# Patient Record
Sex: Male | Born: 1945 | Race: White | Hispanic: No | Marital: Married | Smoking: Former smoker
Health system: Southern US, Community
[De-identification: ages and names within clinical notes are randomized; demographics above are authoritative.]

## PROBLEM LIST (undated history)

## (undated) DIAGNOSIS — K7460 Unspecified cirrhosis of liver: Secondary | ICD-10-CM

## (undated) DIAGNOSIS — M1990 Unspecified osteoarthritis, unspecified site: Secondary | ICD-10-CM

## (undated) DIAGNOSIS — G4730 Sleep apnea, unspecified: Secondary | ICD-10-CM

## (undated) DIAGNOSIS — I10 Essential (primary) hypertension: Secondary | ICD-10-CM

## (undated) DIAGNOSIS — W19XXXA Unspecified fall, initial encounter: Secondary | ICD-10-CM

## (undated) DIAGNOSIS — R011 Cardiac murmur, unspecified: Secondary | ICD-10-CM

## (undated) DIAGNOSIS — K7581 Nonalcoholic steatohepatitis (NASH): Secondary | ICD-10-CM

## (undated) DIAGNOSIS — K284 Chronic or unspecified gastrojejunal ulcer with hemorrhage: Secondary | ICD-10-CM

## (undated) DIAGNOSIS — I8500 Esophageal varices without bleeding: Secondary | ICD-10-CM

## (undated) DIAGNOSIS — D509 Iron deficiency anemia, unspecified: Secondary | ICD-10-CM

## (undated) DIAGNOSIS — K274 Chronic or unspecified peptic ulcer, site unspecified, with hemorrhage: Secondary | ICD-10-CM

## (undated) DIAGNOSIS — C801 Malignant (primary) neoplasm, unspecified: Secondary | ICD-10-CM

## (undated) DIAGNOSIS — R161 Splenomegaly, not elsewhere classified: Secondary | ICD-10-CM

## (undated) DIAGNOSIS — E119 Type 2 diabetes mellitus without complications: Secondary | ICD-10-CM

## (undated) DIAGNOSIS — D696 Thrombocytopenia, unspecified: Secondary | ICD-10-CM

## (undated) HISTORY — PX: CHOLECYSTECTOMY: SHX55

## (undated) HISTORY — PX: MECHANICAL AORTIC VALVE REPLACEMENT: SHX2013

## (undated) HISTORY — PX: SKIN BIOPSY: SHX1

---

## 2014-05-01 ENCOUNTER — Emergency Department: Payer: Self-pay | Admitting: Emergency Medicine

## 2014-05-01 LAB — COMPREHENSIVE METABOLIC PANEL
ALT: 41 U/L (ref 12–78)
ANION GAP: 3 — AB (ref 7–16)
Albumin: 4.2 g/dL (ref 3.4–5.0)
Alkaline Phosphatase: 46 U/L
BUN: 27 mg/dL — ABNORMAL HIGH (ref 7–18)
Bilirubin,Total: 0.6 mg/dL (ref 0.2–1.0)
CHLORIDE: 105 mmol/L (ref 98–107)
CO2: 28 mmol/L (ref 21–32)
Calcium, Total: 9.4 mg/dL (ref 8.5–10.1)
Creatinine: 1.49 mg/dL — ABNORMAL HIGH (ref 0.60–1.30)
EGFR (African American): 55 — ABNORMAL LOW
EGFR (Non-African Amer.): 47 — ABNORMAL LOW
GLUCOSE: 178 mg/dL — AB (ref 65–99)
Osmolality: 281 (ref 275–301)
POTASSIUM: 4.2 mmol/L (ref 3.5–5.1)
SGOT(AST): 43 U/L — ABNORMAL HIGH (ref 15–37)
SODIUM: 136 mmol/L (ref 136–145)
TOTAL PROTEIN: 7.9 g/dL (ref 6.4–8.2)

## 2014-05-01 LAB — CBC
HCT: 36.6 % — AB (ref 40.0–52.0)
HGB: 12.3 g/dL — ABNORMAL LOW (ref 13.0–18.0)
MCH: 32.2 pg (ref 26.0–34.0)
MCHC: 33.7 g/dL (ref 32.0–36.0)
MCV: 96 fL (ref 80–100)
Platelet: 72 10*3/uL — ABNORMAL LOW (ref 150–440)
RBC: 3.83 10*6/uL — ABNORMAL LOW (ref 4.40–5.90)
RDW: 15.8 % — ABNORMAL HIGH (ref 11.5–14.5)
WBC: 2.9 10*3/uL — AB (ref 3.8–10.6)

## 2014-05-01 LAB — URINALYSIS, COMPLETE
BILIRUBIN, UR: NEGATIVE
Bacteria: NONE SEEN
Blood: NEGATIVE
GLUCOSE, UR: NEGATIVE mg/dL (ref 0–75)
Ketone: NEGATIVE
LEUKOCYTE ESTERASE: NEGATIVE
NITRITE: NEGATIVE
Ph: 6 (ref 4.5–8.0)
Protein: NEGATIVE
RBC,UR: NONE SEEN /HPF (ref 0–5)
Specific Gravity: 1.015 (ref 1.003–1.030)
Squamous Epithelial: NONE SEEN
WBC UR: <1 /HPF (ref 0–5)

## 2014-05-01 LAB — LIPASE, BLOOD: LIPASE: 264 U/L (ref 73–393)

## 2018-07-13 ENCOUNTER — Emergency Department
Admission: EM | Admit: 2018-07-13 | Discharge: 2018-07-13 | Disposition: A | Discharge status: Home / Self Care | Payer: Medicare Other | Attending: Emergency Medicine | Admitting: Emergency Medicine

## 2018-07-13 ENCOUNTER — Emergency Department: Payer: Medicare Other

## 2018-07-13 ENCOUNTER — Encounter: Payer: Self-pay | Admitting: Emergency Medicine

## 2018-07-13 DIAGNOSIS — Z91040 Latex allergy status: Secondary | ICD-10-CM | POA: Insufficient documentation

## 2018-07-13 DIAGNOSIS — Z79899 Other long term (current) drug therapy: Secondary | ICD-10-CM | POA: Insufficient documentation

## 2018-07-13 DIAGNOSIS — Z85820 Personal history of malignant melanoma of skin: Secondary | ICD-10-CM | POA: Insufficient documentation

## 2018-07-13 DIAGNOSIS — Z794 Long term (current) use of insulin: Secondary | ICD-10-CM | POA: Insufficient documentation

## 2018-07-13 DIAGNOSIS — E119 Type 2 diabetes mellitus without complications: Secondary | ICD-10-CM | POA: Diagnosis not present

## 2018-07-13 DIAGNOSIS — I10 Essential (primary) hypertension: Secondary | ICD-10-CM | POA: Insufficient documentation

## 2018-07-13 DIAGNOSIS — D696 Thrombocytopenia, unspecified: Secondary | ICD-10-CM | POA: Insufficient documentation

## 2018-07-13 DIAGNOSIS — M25461 Effusion, right knee: Secondary | ICD-10-CM | POA: Diagnosis not present

## 2018-07-13 DIAGNOSIS — M25561 Pain in right knee: Secondary | ICD-10-CM | POA: Diagnosis present

## 2018-07-13 HISTORY — DX: Splenomegaly, not elsewhere classified: R16.1

## 2018-07-13 HISTORY — DX: Unspecified cirrhosis of liver: K74.60

## 2018-07-13 HISTORY — DX: Type 2 diabetes mellitus without complications: E11.9

## 2018-07-13 HISTORY — DX: Essential (primary) hypertension: I10

## 2018-07-13 HISTORY — DX: Unspecified osteoarthritis, unspecified site: M19.90

## 2018-07-13 HISTORY — DX: Thrombocytopenia, unspecified: D69.6

## 2018-07-13 HISTORY — DX: Malignant (primary) neoplasm, unspecified: C80.1

## 2018-07-13 LAB — TYPE AND SCREEN
ABO/RH(D): O NEG
Antibody Screen: NEGATIVE

## 2018-07-13 LAB — COMPREHENSIVE METABOLIC PANEL
ALBUMIN: 3.8 g/dL (ref 3.5–5.0)
ALK PHOS: 79 U/L (ref 38–126)
ALT: 42 U/L (ref 0–44)
AST: 52 U/L — ABNORMAL HIGH (ref 15–41)
Anion gap: 7 (ref 5–15)
BUN: 11 mg/dL (ref 8–23)
CALCIUM: 9.3 mg/dL (ref 8.9–10.3)
CO2: 27 mmol/L (ref 22–32)
Chloride: 105 mmol/L (ref 98–111)
Creatinine, Ser: 0.86 mg/dL (ref 0.61–1.24)
GFR calc Af Amer: >60 mL/min (ref >60)
GFR calc non Af Amer: >60 mL/min (ref >60)
Glucose, Bld: 115 mg/dL — ABNORMAL HIGH (ref 70–99)
Potassium: 3.9 mmol/L (ref 3.5–5.1)
SODIUM: 139 mmol/L (ref 135–145)
TOTAL PROTEIN: 7.4 g/dL (ref 6.5–8.1)
Total Bilirubin: 1.3 mg/dL — ABNORMAL HIGH (ref 0.3–1.2)

## 2018-07-13 LAB — CBC WITH DIFFERENTIAL/PLATELET
BASOS ABS: 0 10*3/uL (ref 0–0.1)
BASOS PCT: 1 %
Eosinophils Absolute: 0.1 10*3/uL (ref 0–0.7)
Eosinophils Relative: 3 %
HEMATOCRIT: 34.7 % — AB (ref 40.0–52.0)
HEMOGLOBIN: 11.9 g/dL — AB (ref 13.0–18.0)
Lymphocytes Relative: 29 %
Lymphs Abs: 0.8 10*3/uL — ABNORMAL LOW (ref 1.0–3.6)
MCH: 32.1 pg (ref 26.0–34.0)
MCHC: 34.3 g/dL (ref 32.0–36.0)
MCV: 93.4 fL (ref 80.0–100.0)
Monocytes Absolute: 0.4 10*3/uL (ref 0.2–1.0)
Monocytes Relative: 14 %
NEUTROS ABS: 1.5 10*3/uL (ref 1.4–6.5)
NEUTROS PCT: 53 %
Platelets: 57 10*3/uL — ABNORMAL LOW (ref 150–440)
RBC: 3.72 MIL/uL — ABNORMAL LOW (ref 4.40–5.90)
RDW: 16.6 % — AB (ref 11.5–14.5)
WBC: 2.8 10*3/uL — ABNORMAL LOW (ref 3.8–10.6)

## 2018-07-13 LAB — PROTIME-INR
INR: 1.27
PROTHROMBIN TIME: 15.8 s — AB (ref 11.4–15.2)

## 2018-07-13 LAB — APTT: aPTT: 36 seconds (ref 24–36)

## 2018-07-13 LAB — URIC ACID: Uric Acid, Serum: 4.7 mg/dL (ref 3.7–8.6)

## 2018-07-13 MED ORDER — OXYCODONE HCL ER 10 MG PO T12A
10.0000 mg | EXTENDED_RELEASE_TABLET | Freq: Once | ORAL | Status: AC
  Administered 2018-07-13: 10 mg via ORAL
  Filled 2018-07-13: qty 1

## 2018-07-13 MED ORDER — DEXAMETHASONE SODIUM PHOSPHATE 10 MG/ML IJ SOLN
10.0000 mg | Freq: Once | INTRAMUSCULAR | Status: AC
  Administered 2018-07-13: 10 mg via INTRAVENOUS
  Filled 2018-07-13: qty 1

## 2018-07-13 MED ORDER — PREDNISONE 50 MG PO TABS: 50.0000 mg | ORAL_TABLET | Freq: Every day | ORAL | 0 refills | Status: DC

## 2018-07-13 MED ORDER — OXYCODONE HCL ER 10 MG PO T12A: 10.0000 mg | EXTENDED_RELEASE_TABLET | Freq: Two times a day (BID) | ORAL | 0 refills | Status: DC

## 2018-07-13 NOTE — ED Notes (Signed)
See triage note  Presents with right knee pain and swelling  Denies any injury but states he has a hx of arthritis but this pain has increased

## 2018-07-13 NOTE — ED Provider Notes (Signed)
[facility] Emergency Department Provider Note  ____________________________________________  Time seen: Approximately 3:13 PM  I have reviewed the triage vital signs and the nursing notes.   HISTORY  Chief Complaint Knee Pain    HPI Joshua Berry is a 72 y.o. male who presents the emergency department for a grossly swollen right knee.  Patient states that he is a patient of the [facility], however he called his primary care and was referred to the emergency department for swelling of the right knee.  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  Per the patient, the swelling was nontraumatic.  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  Patient reports that after consulting with his primary care, he was referred to the emergency department for evaluation of worsening thrombocytopenia causing hemorrhaging into the right knee.  Patient reports that he has a pressure sensation at rest, however with any weightbearing is a sharp sensation globally to the knee.  No specific tenderness.  Patient reports that he does have some bruising to the rest of the body, however no increase from baseline.  Patient denies any headache, visual changes, neck pain, chest pain, shortness of breath.  He denies any hematemesis or hematochezia.  He denies any hematuria, polyuria.  Again, no trauma to the knee.  He does not report gross swelling.  No numbness or tingling distally.  No other complaints at this time.    Past Medical History:  Diagnosis Date  . Arthritis   . Cancer (HCC)    melanoma, carcinoma  . Cirrhosis of liver ([place])   . Diabetes mellitus without complication ([place])   . Hypertension   . Spleen enlarged   . Thrombocytopenia ([place])     There are no active  problems to display for this patient.   Past Surgical History:  Procedure Laterality Date  . CHOLECYSTECTOMY    . SKIN BIOPSY      Prior to Admission medications   Medication Sig Start Date End Date Taking? Authorizing Provider  insulin NPH-regular Human (NOVOLIN 70/30) (70-30) 100 UNIT/ML injection Inject 60 Units into the skin.   Yes [provider]  ipratropium-albuterol (DUONEB) 0.5-2.5 (3) MG/3ML SOLN Take 3 mLs by nebulization.   Yes [provider]  levothyroxine (SYNTHROID, LEVOTHROID) 100 MCG tablet Take 100 mcg by mouth daily before breakfast.   Yes [provider]  metFORMIN (GLUMETZA) 500 MG (MOD) 24 hr tablet Take 500 mg by mouth at bedtime.   Yes [provider]  omeprazole (PRILOSEC) 20 MG capsule Take 20 mg by mouth daily.   Yes [provider]  traMADol (ULTRAM) 50 MG tablet Take by mouth every 6 (six) hours as needed.   Yes [provider]  oxyCODONE (OXYCONTIN) 10 mg 12 hr tablet Take 1 tablet (10 mg total) by mouth every 12 (twelve) hours. 07/13/18   Serafina Topham, Charline Bills, PA-C  predniSONE (DELTASONE) 50 MG tablet Take 1 tablet (50 mg total) by mouth daily with breakfast. 07/13/18   Abran Gavigan, Charline Bills, PA-C    Allergies Lisinopril; Sulfamethoxazole; Camphor; Latex; and Nickel  No family history on file.  Social History Social History   Tobacco Use  . Smoking status: Never Smoker  . Smokeless tobacco: Never Used  Substance Use Topics  . Alcohol use: Not on  file  . Drug use: Not on file     Review of Systems  Constitutional: No fever/chills Eyes: No visual changes. No discharge ENT: No upper respiratory complaints. Cardiovascular: no chest pain. Respiratory: no cough. No SOB. Gastrointestinal: No abdominal pain.  No nausea, no vomiting.  No diarrhea.  No constipation. Genitourinary: Negative for dysuria. No hematuria Musculoskeletal: Nontraumatic edema of the right knee. Skin: Negative for rash,  abrasions, lacerations.  Intermittent areas of ecchymosis, at baseline with no increase. Neurological: Negative for headaches, focal weakness or numbness. 10-point ROS otherwise negative.  ____________________________________________   PHYSICAL EXAM:  VITAL SIGNS: ED Triage Vitals [07/13/18 1458]  Enc Vitals Group     BP (!) 148/76     Pulse Rate (!) 56     Resp 18     Temp 98 F (36.7 C)     Temp Source Oral     SpO2 95 %     Weight 263 lb (119.3 kg)     Height 6' 2"  (1.88 m)     Head Circumference      Peak Flow      Pain Score 2     Pain Loc      Pain Edu?      Excl. in [place]?      Constitutional: Alert and oriented. Well appearing and in no acute distress. Eyes: Conjunctivae are normal. PERRL. EOMI. Head: Atraumatic. Neck: No stridor.   Hematological/Lymphatic/Immunilogical: No cervical lymphadenopathy. Cardiovascular: Normal rate, regular rhythm. Normal S1 and S2.  Good peripheral circulation. Respiratory: Normal respiratory effort without tachypnea or retractions. Lungs CTAB. Good air entry to the bases with no decreased or absent breath sounds. Gastrointestinal: Bowel sounds 4 quadrants. Soft and nontender to palpation. No guarding or rigidity. No palpable masses. No distention. No CVA tenderness. Musculoskeletal: Full range of motion to all extremities. No gross deformities appreciated.  Visualization of the right knee reveals gross edema.  Positive for ballottement in the suprapatellar space.  Palpation reveals tenderness globally to the knee without specific point tenderness.  Patient has no palpable abnormality with the exception of joint effusion/ballottement.  Varus, valgus, Lachman's is negative.  Dorsalis pedis pulse intact distally.  Sensation intact distally. Neurologic:  Normal speech and language. No gross focal neurologic deficits are appreciated.  Skin:  Skin is warm, dry and intact. No rash noted.  A few scattered areas of ecchymosis.  Per patient, baseline  amount.  No purpura. Psychiatric: Mood and affect are normal. Speech and behavior are normal. Patient exhibits appropriate insight and judgement.   ____________________________________________   LABS (all labs ordered are listed, but only abnormal results are displayed)  Labs Reviewed  COMPREHENSIVE METABOLIC PANEL - Abnormal; Notable for the following components:      Result Value   Glucose, Bld 115 (*)    AST 52 (*)    Total Bilirubin 1.3 (*)    All other components within normal limits  CBC WITH DIFFERENTIAL/PLATELET - Abnormal; Notable for the following components:   WBC 2.8 (*)    RBC 3.72 (*)    Hemoglobin 11.9 (*)    HCT 34.7 (*)    RDW 16.6 (*)    Platelets 57 (*)    Lymphs Abs 0.8 (*)    All other components within normal limits  PROTIME-INR - Abnormal; Notable for the following components:   Prothrombin Time 15.8 (*)    All other components within normal limits  URIC ACID  APTT  TYPE AND SCREEN   ____________________________________________  EKG   ____________________________________________  RADIOLOGY I personally viewed and evaluated these images as part of my medical decision making, as well as reviewing the written report by the radiologist.  I concur with radiologist finding of significant osteoarthritis with no acute osseous abnormality.  Significant joint effusion in the suprapatellar space.  Dg Knee Complete 4 Views Right  Result Date: 07/13/2018 CLINICAL DATA:  Initial evaluation for acute right knee pain and swelling for 2 days. No injury. EXAM: RIGHT KNEE - COMPLETE 4+ VIEW COMPARISON:  None. FINDINGS: No acute fracture or dislocation. Moderate to severe tricompartmental degenerative osteoarthritic changes. Associated moderate to large joint effusion. Osseous mineralization normal. No soft tissue abnormality. IMPRESSION: 1. No acute osseous abnormality about the knee. 2. Moderate to severe tricompartmental degenerative osteoarthritic changes with  associated moderate to large joint effusion. Electronically Signed   By: Jeannine Boga M.D.   On: 07/13/2018 15:32    ____________________________________________    PROCEDURES  Procedure(s) performed:    Procedures    Medications  dexamethasone (DECADRON) injection 10 mg (10 mg Intravenous Given 07/13/18 1726)  oxyCODONE (OXYCONTIN) 12 hr tablet 10 mg (10 mg Oral Given 07/13/18 1726)     ____________________________________________   INITIAL IMPRESSION / ASSESSMENT AND PLAN / ED COURSE  Pertinent labs & imaging results that were available during my care of the patient were reviewed by me and considered in my medical decision making (see chart for details).  Review of the [place] CSRS was performed in accordance of the [place] prior to dispensing any controlled drugs.      Patient's diagnosis is consistent with knee effusion.  Patient presented to the emergency department with nontraumatic knee swelling.  Differential included gout, septic arthritis, knee effusion, knee strain/sprain, fracture, hemarthrosis.  Patient with a history of thrombocytopenia.  Given patient's presentation with nontraumatic edema, labs and imaging were obtained.  No acute osseous abnormality with exception of known osteoarthritis.  Labs returned with consistent results from previous labs.  At this time, patient did endorse increased activity including pushing a wheelchair with his wife.  Given this, likely effusion from either increased work on severe osteoarthritis versus mild hemarthrosis.  Patient will be placed on steroid course for inflammation control.  Patient is given a limited prescription for OxyContin as he is unable to take Tylenol-containing drugs due to history of liver cirrhosis.  Patient is to follow-up with his primary care as needed.  Patient is given ED precautions to return to the ED for any worsening or new symptoms.     ____________________________________________  FINAL CLINICAL  IMPRESSION(S) / ED DIAGNOSES  Final diagnoses:  Effusion of right knee      NEW MEDICATIONS STARTED DURING THIS VISIT:  ED Discharge Orders        Ordered    predniSONE (DELTASONE) 50 MG tablet  Daily with breakfast     07/13/18 1708    oxyCODONE (OXYCONTIN) 10 mg 12 hr tablet  Every 12 hours     07/13/18 1709          This chart was dictated using voice recognition software/Dragon. Despite best efforts to proofread, errors can occur which can change the meaning. Any change was purely unintentional.    Darletta Moll, PA-C 07/13/18 [place], [place], MD 07/23/18 [phone]

## 2018-07-13 NOTE — ED Triage Notes (Signed)
Patient presents to the ED with right knee pain and swelling x 2 days.  Patient states, "it feels like someone is stabbing it with an ice pick".  Patient denies known injury to knee.  Patient is in no obvious distress at this time.

## 2018-08-05 ENCOUNTER — Emergency Department
Admission: EM | Admit: 2018-08-05 | Discharge: 2018-08-05 | Disposition: A | Discharge status: Home / Self Care | Payer: Medicare Other | Attending: Emergency Medicine | Admitting: Emergency Medicine

## 2018-08-05 DIAGNOSIS — I10 Essential (primary) hypertension: Secondary | ICD-10-CM | POA: Diagnosis not present

## 2018-08-05 DIAGNOSIS — L7622 Postprocedural hemorrhage and hematoma of skin and subcutaneous tissue following other procedure: Secondary | ICD-10-CM | POA: Diagnosis not present

## 2018-08-05 DIAGNOSIS — Z79899 Other long term (current) drug therapy: Secondary | ICD-10-CM | POA: Diagnosis not present

## 2018-08-05 DIAGNOSIS — E119 Type 2 diabetes mellitus without complications: Secondary | ICD-10-CM | POA: Insufficient documentation

## 2018-08-05 DIAGNOSIS — Z794 Long term (current) use of insulin: Secondary | ICD-10-CM | POA: Diagnosis not present

## 2018-08-05 LAB — BASIC METABOLIC PANEL
Anion gap: 7 (ref 5–15)
BUN: 12 mg/dL (ref 8–23)
CALCIUM: 9.2 mg/dL (ref 8.9–10.3)
CO2: 27 mmol/L (ref 22–32)
Chloride: 104 mmol/L (ref 98–111)
Creatinine, Ser: 0.87 mg/dL (ref 0.61–1.24)
Glucose, Bld: 239 mg/dL — ABNORMAL HIGH (ref 70–99)
Potassium: 4 mmol/L (ref 3.5–5.1)
SODIUM: 138 mmol/L (ref 135–145)

## 2018-08-05 LAB — CBC
HEMATOCRIT: 34.5 % — AB (ref 40.0–52.0)
Hemoglobin: 12 g/dL — ABNORMAL LOW (ref 13.0–18.0)
MCH: 32.5 pg (ref 26.0–34.0)
MCHC: 34.7 g/dL (ref 32.0–36.0)
MCV: 93.4 fL (ref 80.0–100.0)
PLATELETS: 47 10*3/uL — AB (ref 150–440)
RBC: 3.69 MIL/uL — ABNORMAL LOW (ref 4.40–5.90)
RDW: 17.6 % — AB (ref 11.5–14.5)
WBC: 1.9 10*3/uL — AB (ref 3.8–10.6)

## 2018-08-05 MED ORDER — LIDOCAINE-EPINEPHRINE 2 %-1:100000 IJ SOLN
INTRAMUSCULAR | Status: AC
  Administered 2018-08-05: 20 mL
  Filled 2018-08-05: qty 1

## 2018-08-05 MED ORDER — LIDOCAINE-EPINEPHRINE 1 %-1:100000 IJ SOLN: 20.0000 mL | Freq: Once | INTRAMUSCULAR | Status: DC

## 2018-08-05 MED ORDER — LIDOCAINE VISCOUS HCL 2 % MT SOLN: 15.0000 mL | Freq: Once | OROMUCOSAL | Status: DC

## 2018-08-05 MED ORDER — LIDOCAINE-EPINEPHRINE 2 %-1:100000 IJ SOLN
20.0000 mL | Freq: Once | INTRAMUSCULAR | Status: AC
  Administered 2018-08-05: 20 mL

## 2018-08-05 NOTE — ED Notes (Signed)
Pt has a bleed on the back of the head due to a removal of cancer in the last couple of days ago. MD at bedside.

## 2018-08-05 NOTE — Discharge Instructions (Addendum)
Keep the dressing in place at least until tomorrow.  Follow-up with the plastic surgeon as directed.  Follow-up with your hematologist tomorrow as scheduled.  Return to the ER immediately for new, worsening, recurrent, or persistent bleeding, weakness or lightheadedness, or any other new or worsening symptoms that concern you.

## 2018-08-05 NOTE — ED Triage Notes (Signed)
Pt had area of cancer cut out of posterior head yesterday. States that when he left, he began to have bleeding. Pt has thrombocytopenia. Area continues to bleed. Wrapped with sterile gauze and coban .

## 2018-08-05 NOTE — ED Provider Notes (Signed)
[facility] Emergency Department Provider Note ____________________________________________   First MD Initiated Contact with Patient 08/05/18 1724     (approximate)  I have reviewed the triage vital signs and the nursing notes.   HISTORY  Chief Complaint Wound Check    HPI Joshua Berry is a 72 y.o. 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  The patient had possibly cancerous lesion removed from his posterior scalp yesterday.  Bleeding was controlled with cautery and he was sent home although he reports that he has had persistent bleeding since last night despite multiple pressure dressings.  Past Medical History:  Diagnosis Date  . Arthritis   . Cancer (HCC)    melanoma, carcinoma  . Cirrhosis of liver ([place])   . Diabetes mellitus without complication ([place])   . Hypertension   . Spleen enlarged   . Thrombocytopenia ([place])     There are no active problems to display for this patient.   Past Surgical History:  Procedure Laterality Date  . CHOLECYSTECTOMY    . SKIN BIOPSY      Prior to Admission medications   Medication Sig Start Date End Date Taking? Authorizing Provider  insulin NPH-regular Human (NOVOLIN 70/30) (70-30) 100 UNIT/ML injection Inject 60 Units into the skin.    [provider]  ipratropium-albuterol (DUONEB) 0.5-2.5 (3) MG/3ML SOLN Take 3 mLs by nebulization.    [provider]  levothyroxine (SYNTHROID, LEVOTHROID) 100 MCG tablet Take 100 mcg by mouth daily before breakfast.    [provider]  metFORMIN (GLUMETZA) 500 MG (MOD) 24 hr tablet Take 500 mg by mouth at bedtime.    [provider]  omeprazole (PRILOSEC) 20 MG capsule Take 20 mg by mouth daily.    [provider]    oxyCODONE (OXYCONTIN) 10 mg 12 hr tablet Take 1 tablet (10 mg total) by mouth every 12 (twelve) hours. 07/13/18   Cuthriell, Charline Bills, PA-C  predniSONE (DELTASONE) 50 MG tablet Take 1 tablet (50 mg total) by mouth daily with breakfast. 07/13/18   Cuthriell, Charline Bills, PA-C  traMADol (ULTRAM) 50 MG tablet Take by mouth every 6 (six) hours as needed.    [provider]    Allergies Lisinopril; Sulfamethoxazole; Camphor; Latex; and Nickel  No family history on file.  Social History Social History   Tobacco Use  . Smoking status: Never Smoker  . Smokeless tobacco: Never Used  Substance Use Topics  . Alcohol use: Not on file  . Drug use: Not on file    Review of Systems  Constitutional: No fever. Eyes: No redness. ENT: No sore throat. Cardiovascular: Denies chest pain. Respiratory: Denies shortness of breath. Gastrointestinal: No melena.  Genitourinary: Negative for hematuria Musculoskeletal: Negative for back pain. Skin: Positive for bleeding from wound. Neurological: Negative for headache.   ____________________________________________   PHYSICAL EXAM:  VITAL SIGNS: ED Triage Vitals [08/05/18 1705]  Enc Vitals Group     BP (!) 152/60     Pulse Rate 60     Resp 18     Temp 98.8 F (37.1 C)     Temp Source Oral     SpO2 96 %     Weight 262 lb 5.6 oz (119 kg)     Height 6' 2"  (1.88 m)  Head Circumference      Peak Flow      Pain Score 0     Pain Loc      Pain Edu?      Excl. in [place]?     Constitutional: Alert and oriented.  Relatively well appearing and in no acute distress. Eyes: Conjunctivae are normal.  No pallor. Head: Atraumatic.  Left posterior scalp with approximately 3 cm round surgical defect.  Small amount of persistent oozing from an area to the left superior aspect of the scalp edge. Nose: No congestion/rhinnorhea. Mouth/Throat: Mucous membranes are moist.   Neck: Normal range of motion.  Cardiovascular: Good peripheral  circulation. Respiratory: Normal respiratory effort.  Gastrointestinal: No distention.  Musculoskeletal: Extremities warm and well perfused.  Neurologic:  Normal speech and language. No gross focal neurologic deficits are appreciated.  Skin:  Skin is warm and dry. No rash noted. Psychiatric: Mood and affect are normal. Speech and behavior are normal.  ____________________________________________   LABS (all labs ordered are listed, but only abnormal results are displayed)  Labs Reviewed  CBC - Abnormal; Notable for the following components:      Result Value   WBC 1.9 (*)    RBC 3.69 (*)    Hemoglobin 12.0 (*)    HCT 34.5 (*)    RDW 17.6 (*)    Platelets 47 (*)    All other components within normal limits  BASIC METABOLIC PANEL - Abnormal; Notable for the following components:   Glucose, Bld 239 (*)    All other components within normal limits   ____________________________________________  EKG   ____________________________________________  RADIOLOGY    ____________________________________________   PROCEDURES  Procedure(s) performed: No  Procedures  Critical Care performed: No ____________________________________________   INITIAL IMPRESSION / ASSESSMENT AND PLAN / ED COURSE  Pertinent labs & imaging results that were available during my care of the patient were reviewed by me and considered in my medical decision making (see chart for details).  72 year old male with PMH as noted above presents with persistent bleeding from a wound resulting from a cancerous lesion being removed from his scalp yesterday.  The patient is also concerned that his platelets may be low.  He has no lightheadedness or evidence of significant blood loss.  On exam, the vital signs are normal.  The patient has a quarter sized lesion to the posterior scalp.  There is what appears to be persistent oozing of blood although on further examination it is primarily localized to one small  area of the edge of the lesion.  There is no visible vessel to cauterize or suture although I suspect that it is primarily a single small vessel which is the main culprit.  I injected lidocaine with epinephrine into the area and held pressure for several minutes.  The bleeding stopped.  I then applied Surgicel and a pressure dressing.  We will obtain labs to evaluate for any worsening thrombocytopenia or significant blood loss.  If the bleeding remains controlled, I anticipate discharge home.  ----------------------------------------- 7:35 PM on 08/05/2018 -----------------------------------------  There has been no recurrent bleeding.  The patient's platelets are 47 which is trending down slightly from his most recent labs but given that he has no life-threatening bleeding at this time there is no indication for emergent intervention.  The patient actually has a follow-up with his hematologist as scheduled for tomorrow.  He is stable for discharge home at this time.  Return precautions given, and he expresses understanding. ____________________________________________  FINAL CLINICAL IMPRESSION(S) / ED DIAGNOSES  Final diagnoses:  Postoperative hemorrhage of skin following non-dermatologic procedure      NEW MEDICATIONS STARTED DURING THIS VISIT:  New Prescriptions   No medications on file     Note:  This document was prepared using Dragon voice recognition software and may include unintentional dictation errors.    Arta Silence, MD 08/05/18 [place]

## 2018-08-07 ENCOUNTER — Emergency Department: Payer: Medicare Other

## 2018-08-07 ENCOUNTER — Emergency Department
Admission: EM | Admit: 2018-08-07 | Discharge: 2018-08-08 | Disposition: A | Discharge status: Home / Self Care | Payer: Medicare Other | Attending: Emergency Medicine | Admitting: Emergency Medicine

## 2018-08-07 ENCOUNTER — Other Ambulatory Visit: Payer: Self-pay

## 2018-08-07 DIAGNOSIS — Z79899 Other long term (current) drug therapy: Secondary | ICD-10-CM | POA: Diagnosis not present

## 2018-08-07 DIAGNOSIS — Z794 Long term (current) use of insulin: Secondary | ICD-10-CM | POA: Diagnosis not present

## 2018-08-07 DIAGNOSIS — R059 Cough, unspecified: Secondary | ICD-10-CM

## 2018-08-07 DIAGNOSIS — E7220 Disorder of urea cycle metabolism, unspecified: Secondary | ICD-10-CM | POA: Diagnosis not present

## 2018-08-07 DIAGNOSIS — R509 Fever, unspecified: Secondary | ICD-10-CM | POA: Diagnosis present

## 2018-08-07 DIAGNOSIS — R05 Cough: Secondary | ICD-10-CM | POA: Diagnosis not present

## 2018-08-07 DIAGNOSIS — Z91040 Latex allergy status: Secondary | ICD-10-CM | POA: Diagnosis not present

## 2018-08-07 DIAGNOSIS — R531 Weakness: Secondary | ICD-10-CM | POA: Insufficient documentation

## 2018-08-07 LAB — CBC WITH DIFFERENTIAL/PLATELET
BASOS ABS: 0 10*3/uL (ref 0–0.1)
Basophils Relative: 0 %
Eosinophils Absolute: 0.1 10*3/uL (ref 0–0.7)
Eosinophils Relative: 1 %
HEMATOCRIT: 36 % — AB (ref 40.0–52.0)
Hemoglobin: 12.4 g/dL — ABNORMAL LOW (ref 13.0–18.0)
LYMPHS PCT: 9 %
Lymphs Abs: 0.5 10*3/uL — ABNORMAL LOW (ref 1.0–3.6)
MCH: 32.2 pg (ref 26.0–34.0)
MCHC: 34.3 g/dL (ref 32.0–36.0)
MCV: 94 fL (ref 80.0–100.0)
Monocytes Absolute: 0.4 10*3/uL (ref 0.2–1.0)
Monocytes Relative: 7 %
NEUTROS ABS: 4.6 10*3/uL (ref 1.4–6.5)
Neutrophils Relative %: 83 %
PLATELETS: 56 10*3/uL — AB (ref 150–440)
RBC: 3.83 MIL/uL — AB (ref 4.40–5.90)
RDW: 17.2 % — ABNORMAL HIGH (ref 11.5–14.5)
WBC: 5.6 10*3/uL (ref 3.8–10.6)

## 2018-08-07 LAB — BLOOD GAS, VENOUS
ACID-BASE EXCESS: 2.4 mmol/L — AB (ref 0.0–2.0)
Bicarbonate: 26.4 mmol/L (ref 20.0–28.0)
O2 SAT: 70.7 %
PCO2 VEN: 38 mmHg — AB (ref 44.0–60.0)
PH VEN: 7.45 — AB (ref 7.250–7.430)
PO2 VEN: 35 mmHg (ref 32.0–45.0)
Patient temperature: 37

## 2018-08-07 LAB — COMPREHENSIVE METABOLIC PANEL
ALK PHOS: 90 U/L (ref 38–126)
ALT: 48 U/L — ABNORMAL HIGH (ref 0–44)
ANION GAP: 6 (ref 5–15)
AST: 51 U/L — ABNORMAL HIGH (ref 15–41)
Albumin: 4 g/dL (ref 3.5–5.0)
BILIRUBIN TOTAL: 1.3 mg/dL — AB (ref 0.3–1.2)
BUN: 12 mg/dL (ref 8–23)
CO2: 27 mmol/L (ref 22–32)
Calcium: 9.4 mg/dL (ref 8.9–10.3)
Chloride: 105 mmol/L (ref 98–111)
Creatinine, Ser: 0.89 mg/dL (ref 0.61–1.24)
GFR calc Af Amer: >60 mL/min (ref >60)
Glucose, Bld: 124 mg/dL — ABNORMAL HIGH (ref 70–99)
POTASSIUM: 3.8 mmol/L (ref 3.5–5.1)
Sodium: 138 mmol/L (ref 135–145)
TOTAL PROTEIN: 7.8 g/dL (ref 6.5–8.1)

## 2018-08-07 LAB — PROTIME-INR
INR: 1.27
PROTHROMBIN TIME: 15.8 s — AB (ref 11.4–15.2)

## 2018-08-07 LAB — AMMONIA: Ammonia: 45 umol/L — ABNORMAL HIGH (ref 9–35)

## 2018-08-07 LAB — LACTIC ACID, PLASMA: LACTIC ACID, VENOUS: 1 mmol/L (ref 0.5–1.9)

## 2018-08-07 MED ORDER — ACETAMINOPHEN 500 MG PO TABS
1000.0000 mg | ORAL_TABLET | Freq: Once | ORAL | Status: AC
  Administered 2018-08-07: 1000 mg via ORAL
  Filled 2018-08-07: qty 2

## 2018-08-07 MED ORDER — SODIUM CHLORIDE 0.9 % IV BOLUS
1000.0000 mL | Freq: Once | INTRAVENOUS | Status: AC
  Administered 2018-08-07: 1000 mL via INTRAVENOUS

## 2018-08-07 MED ORDER — VANCOMYCIN HCL 10 G IV SOLR
2000.0000 mg | Freq: Once | INTRAVENOUS | Status: AC
  Administered 2018-08-07: 2000 mg via INTRAVENOUS
  Filled 2018-08-07: qty 2000

## 2018-08-07 MED ORDER — PIPERACILLIN-TAZOBACTAM 3.375 G IVPB 30 MIN
3.3750 g | Freq: Once | INTRAVENOUS | Status: AC
  Administered 2018-08-07: 3.375 g via INTRAVENOUS
  Filled 2018-08-07: qty 50

## 2018-08-07 NOTE — ED Provider Notes (Signed)
[facility] Emergency Department Provider Note  ____________________________________________  Time seen: Approximately 9:10 PM  I have reviewed the triage vital signs and the nursing notes.   HISTORY  Chief Complaint Fever    HPI Joshua Berry is a 72 y.o. 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  The patient reports that 3 days ago, he underwent biopsy of the posterior scalp in the left ear, which resulted in pathology positive for squamous cell carcinoma.  Over the past day, he has been feeling progressively weak, with associated nausea but no vomiting.  He has had fever and is febrile to 102.8 here.  He denies any abdominal pain, constipation or diarrhea.  He has had a nonproductive cough without congestion or rhinorrhea, no ear pain.  No shortness of breath or chest pain.  Past Medical History:  Diagnosis Date  . Arthritis   . Cancer (HCC)    melanoma, carcinoma  . Cirrhosis of liver ([place])   . Diabetes mellitus without complication ([place])   . Hypertension   . Spleen enlarged   . Thrombocytopenia ([place])     There are no active problems to display for this patient.   Past Surgical History:  Procedure Laterality Date  . CHOLECYSTECTOMY    . SKIN BIOPSY      Current Outpatient Rx  . Order #: [id number] Class: Historical Med  . Order #: [id number] Class: Historical Med  . Order #: [id number] Class: Historical Med  . Order #: [id number] Class: Historical Med  . Order #: [id number] Class: Historical Med  . Order #: [id number] Class: Print  . Order #: [id number] Class: Print  . Order #: [id number] Class: Historical Med    Allergies Lisinopril; Sulfamethoxazole; Camphor; Latex; and Nickel  No family history on file.  Social History Social History   Tobacco Use  . Smoking status: Never Smoker  . Smokeless tobacco: Never Used  Substance Use  Topics  . Alcohol use: Not on file  . Drug use: Not on file    Review of Systems Constitutional: No fever/chills.  Lightheadedness or syncope. Eyes: No visual changes.  No eye discharge. ENT: No sore throat. No congestion or rhinorrhea.  No ear pain. Cardiovascular: Denies chest pain. Denies palpitations. Respiratory: Denies shortness of breath.  Positive nonproductive cough. Gastrointestinal: No abdominal pain.  Positive nausea, no vomiting.  No diarrhea.  No constipation. Genitourinary: Negative for dysuria.  No urinary frequency.  No hematuria. Musculoskeletal: Negative for back pain. Skin: Negative for rash.  The patient has mild bleeding at both of his biopsy sites, which is stable. Neurological: Negative for headaches. No focal numbness, tingling or weakness.     ____________________________________________   PHYSICAL EXAM:  VITAL SIGNS: ED Triage Vitals  Enc Vitals Group     BP 08/07/18 2040 (!) 151/63     Pulse Rate 08/07/18 2040 81     Resp 08/07/18 2040 18     Temp 08/07/18 2040 (!) 102.8 F (39.3 C)     Temp Source 08/07/18 2040 Oral     SpO2 08/07/18 2040 95 %     Weight 08/07/18 2041 262 lb 5.6 oz (119 kg)     Height 08/07/18 2041 6' 2"  (1.88 m)     Head Circumference --      Peak Flow --      Pain Score 08/07/18 2039 5     Pain Loc --  Pain Edu? --      Excl. in [place]? --     Constitutional: Alert and oriented.Answers questions appropriately.  Likely ill-appearing and mildly pale. Eyes: Conjunctivae are normal.  EOMI. No scleral icterus.  No eye discharge. Head: On the posterior scalp, the patient has a circular biopsy site which is approximately 2 cm in diameter and has minimal bleeding that is active.  He has no surrounding erythema, swelling, or discharge.  On the left ear on the posterior aspect of the ear, the patient has a hematoma at the biopsy site without any overlying fluctuance, erythema, or purulent discharge per. Nose: No  congestion/rhinnorhea. Mouth/Throat: Mucous membranes are moist.  Neck: No stridor.  Supple.  No meningismus peer Cardiovascular: Normal rate, regular rhythm.  Holosystolic murmurs, without rubs or gallops.  Respiratory: Normal respiratory effort.  No accessory muscle use or retractions. Lungs CTAB.  No wheezes, rales or ronchi. Gastrointestinal: Obese.  Soft, nontender and nondistended.  No guarding or rebound.  No peritoneal signs. Musculoskeletal: No LE edema. No ttp in the calves or palpable cords.  Negative Homan's sign. Neurologic:  A&Ox3.  Speech is clear.  Face and smile are symmetric.  EOMI.  Moves all extremities well. Skin:  Skin is warm, dry.see exam above.   Psychiatric: Mood and affect are normal. Speech and behavior are normal.  Normal judgement.  ____________________________________________   LABS (all labs ordered are listed, but only abnormal results are displayed)  Labs Reviewed  COMPREHENSIVE METABOLIC PANEL - Abnormal; Notable for the following components:      Result Value   Glucose, Bld 124 (*)    AST 51 (*)    ALT 48 (*)    Total Bilirubin 1.3 (*)    All other components within normal limits  CBC WITH DIFFERENTIAL/PLATELET - Abnormal; Notable for the following components:   RBC 3.83 (*)    Hemoglobin 12.4 (*)    HCT 36.0 (*)    RDW 17.2 (*)    Platelets 56 (*)    Lymphs Abs 0.5 (*)    All other components within normal limits  AMMONIA - Abnormal; Notable for the following components:   Ammonia 45 (*)    All other components within normal limits  BLOOD GAS, VENOUS - Abnormal; Notable for the following components:   pH, Ven 7.45 (*)    pCO2, Ven 38 (*)    Acid-Base Excess 2.4 (*)    All other components within normal limits  PROTIME-INR - Abnormal; Notable for the following components:   Prothrombin Time 15.8 (*)    All other components within normal limits  CULTURE, BLOOD (ROUTINE X 2)  CULTURE, BLOOD (ROUTINE X 2)  URINE CULTURE  LACTIC ACID,  PLASMA  URINALYSIS, COMPLETE (UACMP) WITH MICROSCOPIC   ____________________________________________  EKG  ED ECG REPORT I, Anne-Caroline Mariea Clonts, the attending physician, personally viewed and interpreted this ECG.   Date: 08/07/2018  EKG Time: 85027  Rate: 65  Rhythm: normal sinus rhythm  Axis: leftward  Intervals:none  ST&T Change: No STEMI  ____________________________________________  RADIOLOGY  Dg Chest 2 View  Result Date: 08/07/2018 CLINICAL DATA:  Cough and fever EXAM: CHEST - 2 VIEW COMPARISON:  None. FINDINGS: Normal heart size. Normal mediastinal contour. No pneumothorax. No pleural effusion. Lungs appear clear, with no acute consolidative airspace disease and no pulmonary edema. Partially visualized left shoulder arthroplasty. IMPRESSION: No active cardiopulmonary disease. Electronically Signed   By: Ilona Sorrel M.D.   On: 08/07/2018 21:56    ____________________________________________  PROCEDURES  Procedure(s) performed: None  Procedures  Critical Care performed: Yes, see critical care note(s) ____________________________________________   INITIAL IMPRESSION / ASSESSMENT AND PLAN / ED COURSE  Pertinent labs & imaging results that were available during my care of the patient were reviewed by me and considered in my medical decision making (see chart for details).  72 y.o. male with recent biopsies of the head and left ear showing squamous cell carcinoma, multiple other chronic comorbidities presented with fever.  Overall, I am concerned about possible bacteremia given his recent procedures.  Clinically, I do not see any evidence of severe infection at his biopsy site but this is also possible.  The patient also has a holosystolic murmur so endocarditis is not excluded.  The patient has had some nausea but no vomiting and has a benign abdominal examination; an intra-abdominal infection for his symptoms is unlikely.  The patient does show evidence of sepsis  and sepsis protocol has been initiated.  He will receive intravenous fluids and empiric antibiotics immediately.  Transfer to the [facility] has been initiated for admission.  ----------------------------------------- 10:53 PM on 08/07/2018 -----------------------------------------  The patient continues to be hemodynamically stable.  His chest x-ray is not consistent with a pneumonia.  He does have some mild hyperammonemia, but he is mentating normally so immediate intervention is not required at this time.  His electrolytes are reassuring and his LFTs are at baseline.  His lactic acid was negative.  His white blood cell count is normal at 5.6. I am awaiting the [facility] response for my request for transfer for admission.  The patient's continued clinical course will be monitored by the oncoming physician.  CRITICAL CARE Performed by: Eula Listen   Total critical care time: 40 minutes  Critical care time was exclusive of separately billable procedures and treating other patients.  Critical care was necessary to treat or prevent imminent or life-threatening deterioration.  Critical care was time spent personally by me on the following activities: development of treatment plan with patient and/or surrogate as well as nursing, discussions with consultants, evaluation of patient's response to treatment, examination of patient, obtaining history from patient or surrogate, ordering and performing treatments and interventions, ordering and review of laboratory studies, ordering and review of radiographic studies, pulse oximetry and re-evaluation of patient's condition.   ____________________________________________  FINAL CLINICAL IMPRESSION(S) / ED DIAGNOSES  Final diagnoses:  Sepsis, due to unspecified organism ([place])  Hyperammonemia ([place])  Cough         NEW MEDICATIONS STARTED DURING THIS VISIT:  New Prescriptions   No medications on file      Eula Listen, MD 08/12/18 2339

## 2018-08-07 NOTE — ED Triage Notes (Addendum)
Pt to triage via wheelchair. Pt reprots started today with a fever. Denies other sx. Had biopsy to area on posterior head on 08/04/18.  Area unwrapped and does not have any redness noted to skin around the area. Dressing reapplied. Temp was 101 oral.

## 2018-08-07 NOTE — ED Notes (Signed)
Pt to xray at this time.

## 2018-08-07 NOTE — Progress Notes (Signed)
CODE SEPSIS - PHARMACY COMMUNICATION  **Broad Spectrum Antibiotics should be administered within 1 hour of Sepsis diagnosis**  Time Code Sepsis Called/Page Received: 9728 2109  Antibiotics Ordered: 0823 2110  Time of 1st antibiotic administration: 0823 2133  Additional action taken by pharmacy:   If necessary, Name of Provider/Nurse Contacted:     Eloise Harman ,PharmD Clinical Pharmacist  08/07/2018  10:17 PM

## 2018-08-08 LAB — URINALYSIS, COMPLETE (UACMP) WITH MICROSCOPIC
BACTERIA UA: NONE SEEN
BILIRUBIN URINE: NEGATIVE
Glucose, UA: NEGATIVE mg/dL
Hgb urine dipstick: NEGATIVE
KETONES UR: NEGATIVE mg/dL
LEUKOCYTES UA: NEGATIVE
Nitrite: NEGATIVE
Protein, ur: 30 mg/dL — AB
SPECIFIC GRAVITY, URINE: 1.015 (ref 1.005–1.030)
SQUAMOUS EPITHELIAL / LPF: NONE SEEN (ref 0–5)
pH: 6 (ref 5.0–8.0)

## 2018-08-08 LAB — GLUCOSE, CAPILLARY: GLUCOSE-CAPILLARY: 81 mg/dL (ref 70–99)

## 2018-08-08 NOTE — Discharge Instructions (Addendum)
Please follow up with your primary care physician for further evaluation of your symptoms. Please return with any worsened symptoms or any other concern.

## 2018-08-08 NOTE — ED Provider Notes (Addendum)
-----------------------------------------   5:36 AM on 08/08/2018 -----------------------------------------   Blood pressure (!) 142/75, pulse 60, temperature 99.1 F (37.3 C), temperature source Oral, resp. rate 16, height 6' 2"  (1.88 m), weight 119 kg, SpO2 95 %.  Assuming care from Dr. Mariea Clonts.  In short, Joshua Berry is a 72 y.o. male with a chief complaint of Fever .  Refer to the original H&P for additional details.  The current plan of care is to report patient to the [place] for admission.  I received a phone call from the [place] after some time of the patient waiting in the emergency department.  I spoke with Dr. Posey Pronto who reports that upon reviewing the patient's chart he does not see the need for the patient to be admitted and that the patient does not meet any admission criteria.  He reports that aside from his fever he is not tachycardic, hypotensive, tachypneic and does not meet any sepsis criteria.  He also reports that since the patient's blood work is unremarkable there is nothing that warrants his admission.  I did go in to reevaluate the patient.  He had some bleeding from his scalp wound which I dressed with Surgicel and an ABD pad.  We did obtain the patient's urinalysis which was also unremarkable.  I did offer to admit the patient to the hospital for further evaluation if he was feeling bad a week or had any other symptoms.  The patient states that if there was not a reason he did not need to be in the hospital.  The patient and his daughter are concerned about his diabetes as well as his low platelets and cirrhosis but again after offering to admit the patient to the hospital the patient declined.  The patient appears well and has no evidence of confusion. He will be discharged and strongly encouraged to return with any worsening symptoms.  We will keep the patient and his daughter updated as to any information that we obtain.        Loney Hering, MD 08/08/18 4825     Loney Hering, MD 08/08/18 [phone]

## 2018-08-09 LAB — URINE CULTURE: CULTURE: NO GROWTH

## 2018-08-11 ENCOUNTER — Other Ambulatory Visit: Payer: Self-pay

## 2018-08-11 ENCOUNTER — Emergency Department
Admission: EM | Admit: 2018-08-11 | Discharge: 2018-08-11 | Disposition: A | Discharge status: Home / Self Care | Payer: Medicare Other | Attending: Emergency Medicine | Admitting: Emergency Medicine

## 2018-08-11 ENCOUNTER — Encounter: Payer: Self-pay | Admitting: Emergency Medicine

## 2018-08-11 DIAGNOSIS — Z91040 Latex allergy status: Secondary | ICD-10-CM | POA: Insufficient documentation

## 2018-08-11 DIAGNOSIS — L7622 Postprocedural hemorrhage and hematoma of skin and subcutaneous tissue following other procedure: Secondary | ICD-10-CM | POA: Insufficient documentation

## 2018-08-11 DIAGNOSIS — E119 Type 2 diabetes mellitus without complications: Secondary | ICD-10-CM | POA: Insufficient documentation

## 2018-08-11 DIAGNOSIS — Z85820 Personal history of malignant melanoma of skin: Secondary | ICD-10-CM | POA: Diagnosis not present

## 2018-08-11 DIAGNOSIS — I10 Essential (primary) hypertension: Secondary | ICD-10-CM | POA: Insufficient documentation

## 2018-08-11 DIAGNOSIS — D696 Thrombocytopenia, unspecified: Secondary | ICD-10-CM | POA: Diagnosis not present

## 2018-08-11 DIAGNOSIS — T148XXA Other injury of unspecified body region, initial encounter: Secondary | ICD-10-CM

## 2018-08-11 LAB — CBC WITH DIFFERENTIAL/PLATELET
Basophils Absolute: 0 10*3/uL (ref 0–0.1)
Basophils Relative: 1 %
EOS PCT: 4 %
Eosinophils Absolute: 0.1 10*3/uL (ref 0–0.7)
HCT: 32.9 % — ABNORMAL LOW (ref 40.0–52.0)
HEMOGLOBIN: 11.3 g/dL — AB (ref 13.0–18.0)
LYMPHS ABS: 0.8 10*3/uL — AB (ref 1.0–3.6)
LYMPHS PCT: 27 %
MCH: 32.2 pg (ref 26.0–34.0)
MCHC: 34.5 g/dL (ref 32.0–36.0)
MCV: 93.4 fL (ref 80.0–100.0)
Monocytes Absolute: 0.4 10*3/uL (ref 0.2–1.0)
Monocytes Relative: 15 %
NEUTROS PCT: 53 %
Neutro Abs: 1.6 10*3/uL (ref 1.4–6.5)
Platelets: 62 10*3/uL — ABNORMAL LOW (ref 150–440)
RBC: 3.52 MIL/uL — AB (ref 4.40–5.90)
RDW: 17.3 % — ABNORMAL HIGH (ref 11.5–14.5)
WBC: 3 10*3/uL — AB (ref 3.8–10.6)

## 2018-08-11 MED ORDER — LIDOCAINE-EPINEPHRINE (PF) 2 %-1:200000 IJ SOLN: 20.0000 mL | Freq: Once | INTRAMUSCULAR | Status: DC

## 2018-08-11 MED ORDER — LIDOCAINE-EPINEPHRINE 2 %-1:100000 IJ SOLN
20.0000 mL | Freq: Once | INTRAMUSCULAR | Status: AC
  Administered 2018-08-11: 20 mL via INTRADERMAL

## 2018-08-11 MED ORDER — SILVER NITRATE-POT NITRATE 75-25 % EX MISC
CUTANEOUS | Status: AC
  Filled 2018-08-11: qty 2

## 2018-08-11 MED ORDER — SILVER NITRATE-POT NITRATE 75-25 % EX MISC
1.0000 | Freq: Once | CUTANEOUS | Status: AC
  Administered 2018-08-11: 1 via TOPICAL

## 2018-08-11 MED ORDER — LIDOCAINE-EPINEPHRINE 2 %-1:100000 IJ SOLN
INTRAMUSCULAR | Status: AC
  Administered 2018-08-11: 20 mL via INTRADERMAL
  Filled 2018-08-11: qty 1

## 2018-08-11 NOTE — ED Notes (Signed)
Pt alert and oriented X4, active, cooperative, pt in NAD. RR even and unlabored, color WNL.  Pt informed to return if any life threatening symptoms occur.  Discharge and followup instructions reviewed.  

## 2018-08-11 NOTE — ED Provider Notes (Signed)
[facility] Emergency Department Provider Note       Time seen: ----------------------------------------- 9:18 AM on 08/11/2018 -----------------------------------------   I have reviewed the triage vital signs and the nursing notes.  HISTORY   Chief Complaint Post-op Problem    HPI Joshua Berry is a 72 y.o. male with a history of skin cancer, arthritis, cirrhosis, diabetes, hypertension and thrombocytopenia who presents to the ED for bleeding scalp wound.  Patient reports he had surgery a week ago today on his head where he had skin cancer removed from his posterior scalp.  Patient reports persistent bleeding with a history of thrombocytopenia.  Dressing had to be changed 3 times by nurses at [facility].  Past Medical History:  Diagnosis Date  . Arthritis   . Cancer (HCC)    melanoma, carcinoma  . Cirrhosis of liver ([place])   . Diabetes mellitus without complication ([place])   . Hypertension   . Spleen enlarged   . Thrombocytopenia ([place])     There are no active problems to display for this patient.   Past Surgical History:  Procedure Laterality Date  . CHOLECYSTECTOMY    . SKIN BIOPSY      Allergies Lisinopril; Sulfamethoxazole; Camphor; Latex; and Nickel  Social History Social History   Tobacco Use  . Smoking status: Never Smoker  . Smokeless tobacco: Never Used  Substance Use Topics  . Alcohol use: Not on file  . Drug use: Not on file   Review of Systems Constitutional: Negative for fever. Musculoskeletal: Negative for back pain. Skin: Positive for scalp bleeding Neurological: Negative for headaches, focal weakness or numbness.  All systems negative/normal/unremarkable except as stated in the HPI  ____________________________________________   PHYSICAL EXAM:  VITAL SIGNS: ED Triage Vitals  Enc Vitals Group     BP 08/11/18 0821 (!) 141/78     Pulse Rate 08/11/18 0821 67     Resp 08/11/18 0821 18     Temp 08/11/18 0821 99.3  F (37.4 C)     Temp Source 08/11/18 0821 Oral     SpO2 08/11/18 0821 96 %     Weight 08/11/18 0821 262 lb (118.8 kg)     Height 08/11/18 0821 6' 2"  (1.88 m)     Head Circumference --      Peak Flow --      Pain Score 08/11/18 0826 6     Pain Loc --      Pain Edu? --      Excl. in [place]? --    Constitutional: Alert and oriented. Well appearing and in no distress. Eyes: Conjunctivae are normal. Normal extraocular movements. ENT   Head: Normocephalic.  There is a defect in the midline parietal scalp posteriorly from previous skin cancer excision.  There is a circumscribed area of previous soft tissue loss.  There is persistent bright red bleeding coming from the wound inferiorly. Neurologic:  Normal speech and language. No gross focal neurologic deficits are appreciated.  Skin: Parietal scalp bleeding as dictated above Psychiatric: Mood and affect are normal. Speech and behavior are normal.  ____________________________________________  ED COURSE:  As part of my medical decision making, I reviewed the following data within the [place] History obtained from family if available, nursing notes, old chart and ekg, as well as notes from prior ED visits. Patient presented for persistent wound bleeding, we will assess with labs and imaging as indicated at this time.   Marland Kitchen.Laceration Repair Date/Time: 08/11/2018 9:22 AM Performed  by: Earleen Newport, MD Authorized by: Earleen Newport, MD   Consent:    Consent obtained:  Verbal   Consent given by:  Patient   Risks discussed:  Infection, pain, retained foreign body, poor cosmetic result and poor wound healing Anesthesia (see MAR for exact dosages):    Anesthesia method:  Local infiltration   Local anesthetic:  Lidocaine 2% WITH epi Laceration details:    Location:  Scalp   Scalp location:  L parietal   Length (cm):  2   Depth (mm):  10 Repair type:    Repair type:  Intermediate Exploration:    Hemostasis  achieved with:  Direct pressure   Wound exploration: entire depth of wound probed and visualized     Contaminated: no   Treatment:    Area cleansed with:  Saline   Amount of cleaning:  Standard   Irrigation solution:  Sterile saline   Visualized foreign bodies/material removed: no   Skin repair:    Repair method:  Sutures   Suture size:  5-0   Suture material:  Fast-absorbing gut   Suture technique:  Figure eight   Number of sutures:  3 Approximation:    Approximation:  Close Post-procedure details:    Dressing:  Sterile dressing   Patient tolerance of procedure:  Tolerated well, no immediate complications    Labs Reviewed  CBC WITH DIFFERENTIAL/PLATELET - Abnormal; Notable for the following components:      Result Value   WBC 3.0 (*)    RBC 3.52 (*)    Hemoglobin 11.3 (*)    HCT 32.9 (*)    RDW 17.3 (*)    Platelets 62 (*)    Lymphs Abs 0.8 (*)    All other components within normal limits   ____________________________________________  DIFFERENTIAL DIAGNOSIS   Thrombocytopenia, postoperative bleeding, wound complication  FINAL ASSESSMENT AND PLAN  Wound bleeding, thrombocytopenia   Plan: The patient had presented for persistent bleeding of the scalp wound.  We achieved hemostasis as dictated in the laceration procedure note.  I placed figure-of-eight stitches and applied silver nitrate with no further rebleeding.  He will see his plastic surgeon tomorrow for recheck.   Laurence Aly, MD   Note: This note was generated in part or whole with voice recognition software. Voice recognition is usually quite accurate but there are transcription errors that can and very often do occur. I apologize for any typographical errors that were not detected and corrected.     Earleen Newport, MD 08/11/18 [phone]

## 2018-08-11 NOTE — ED Triage Notes (Signed)
Pt reports had surgery a week ago today on his head. Pt states that he had cancer removed from his head. Pt reports is back again for the third time due to the area leaking. Pt reports noone has been able to get it to stop. Pt reports hx of thrombocytopenia.

## 2018-08-11 NOTE — ED Notes (Signed)
First Nurse Note: Patient states he had skin surgery at [facility], here today because of having to have dressing changed X 3 by nurses at [facility].  Dressing dry and intact.

## 2018-08-11 NOTE — ED Notes (Signed)
ED Provider at bedside. 

## 2018-08-12 LAB — CULTURE, BLOOD (ROUTINE X 2)
CULTURE: NO GROWTH
CULTURE: NO GROWTH
Special Requests: ADEQUATE

## 2018-08-24 ENCOUNTER — Encounter: Payer: Self-pay | Admitting: Emergency Medicine

## 2018-08-24 ENCOUNTER — Other Ambulatory Visit: Payer: Self-pay

## 2018-08-24 ENCOUNTER — Emergency Department
Admission: EM | Admit: 2018-08-24 | Discharge: 2018-08-24 | Disposition: A | Discharge status: Home / Self Care | Payer: Medicare Other | Attending: Emergency Medicine | Admitting: Emergency Medicine

## 2018-08-24 DIAGNOSIS — E1165 Type 2 diabetes mellitus with hyperglycemia: Secondary | ICD-10-CM | POA: Diagnosis not present

## 2018-08-24 DIAGNOSIS — R197 Diarrhea, unspecified: Secondary | ICD-10-CM | POA: Diagnosis present

## 2018-08-24 DIAGNOSIS — Z85828 Personal history of other malignant neoplasm of skin: Secondary | ICD-10-CM | POA: Diagnosis not present

## 2018-08-24 DIAGNOSIS — R739 Hyperglycemia, unspecified: Secondary | ICD-10-CM

## 2018-08-24 DIAGNOSIS — Z91040 Latex allergy status: Secondary | ICD-10-CM | POA: Diagnosis not present

## 2018-08-24 DIAGNOSIS — Z85820 Personal history of malignant melanoma of skin: Secondary | ICD-10-CM | POA: Diagnosis not present

## 2018-08-24 DIAGNOSIS — I10 Essential (primary) hypertension: Secondary | ICD-10-CM | POA: Diagnosis not present

## 2018-08-24 DIAGNOSIS — R339 Retention of urine, unspecified: Secondary | ICD-10-CM

## 2018-08-24 DIAGNOSIS — K648 Other hemorrhoids: Secondary | ICD-10-CM

## 2018-08-24 DIAGNOSIS — K644 Residual hemorrhoidal skin tags: Secondary | ICD-10-CM | POA: Insufficient documentation

## 2018-08-24 DIAGNOSIS — R110 Nausea: Secondary | ICD-10-CM

## 2018-08-24 LAB — CBC
HEMATOCRIT: 32.4 % — AB (ref 40.0–52.0)
HEMOGLOBIN: 11.3 g/dL — AB (ref 13.0–18.0)
MCH: 32 pg (ref 26.0–34.0)
MCHC: 34.8 g/dL (ref 32.0–36.0)
MCV: 91.8 fL (ref 80.0–100.0)
Platelets: 62 10*3/uL — ABNORMAL LOW (ref 150–440)
RBC: 3.52 MIL/uL — AB (ref 4.40–5.90)
RDW: 16.7 % — ABNORMAL HIGH (ref 11.5–14.5)
WBC: 4.1 10*3/uL (ref 3.8–10.6)

## 2018-08-24 LAB — URINALYSIS, COMPLETE (UACMP) WITH MICROSCOPIC
BACTERIA UA: NONE SEEN
Bilirubin Urine: NEGATIVE
Glucose, UA: 50 mg/dL — AB
Hgb urine dipstick: NEGATIVE
Ketones, ur: NEGATIVE mg/dL
Leukocytes, UA: NEGATIVE
Nitrite: NEGATIVE
Protein, ur: 100 mg/dL — AB
SPECIFIC GRAVITY, URINE: 1.024 (ref 1.005–1.030)
Squamous Epithelial / HPF: NONE SEEN (ref 0–5)
pH: 5 (ref 5.0–8.0)

## 2018-08-24 LAB — COMPREHENSIVE METABOLIC PANEL
ALK PHOS: 73 U/L (ref 38–126)
ALT: 29 U/L (ref 0–44)
ANION GAP: 10 (ref 5–15)
AST: 35 U/L (ref 15–41)
Albumin: 3.7 g/dL (ref 3.5–5.0)
BUN: 11 mg/dL (ref 8–23)
CHLORIDE: 104 mmol/L (ref 98–111)
CO2: 24 mmol/L (ref 22–32)
Calcium: 8.8 mg/dL — ABNORMAL LOW (ref 8.9–10.3)
Creatinine, Ser: 0.8 mg/dL (ref 0.61–1.24)
GFR calc Af Amer: >60 mL/min (ref >60)
GFR calc non Af Amer: >60 mL/min (ref >60)
GLUCOSE: 250 mg/dL — AB (ref 70–99)
Potassium: 3.6 mmol/L (ref 3.5–5.1)
Sodium: 138 mmol/L (ref 135–145)
Total Bilirubin: 1.4 mg/dL — ABNORMAL HIGH (ref 0.3–1.2)
Total Protein: 7.2 g/dL (ref 6.5–8.1)

## 2018-08-24 LAB — PROTIME-INR
INR: 1.26
PROTHROMBIN TIME: 15.7 s — AB (ref 11.4–15.2)

## 2018-08-24 LAB — APTT: aPTT: 35 seconds (ref 24–36)

## 2018-08-24 LAB — LIPASE, BLOOD: Lipase: 37 U/L (ref 11–51)

## 2018-08-24 MED ORDER — ONDANSETRON 4 MG PO TBDP: 4.0000 mg | ORAL_TABLET | Freq: Three times a day (TID) | ORAL | 0 refills | Status: DC | PRN

## 2018-08-24 MED ORDER — LIDOCAINE HCL URETHRAL/MUCOSAL 2 % EX GEL
CUTANEOUS | Status: AC
  Administered 2018-08-24: 10
  Filled 2018-08-24: qty 10

## 2018-08-24 MED ORDER — SODIUM CHLORIDE 0.9 % IV BOLUS
1000.0000 mL | Freq: Once | INTRAVENOUS | Status: AC
  Administered 2018-08-24: 1000 mL via INTRAVENOUS

## 2018-08-24 MED ORDER — LOPERAMIDE HCL 2 MG PO TABS: 2.0000 mg | ORAL_TABLET | Freq: Four times a day (QID) | ORAL | 0 refills | Status: DC | PRN

## 2018-08-24 MED ORDER — HYDROCORTISONE 2.5 % RE CREA: TOPICAL_CREAM | RECTAL | 0 refills | Status: DC

## 2018-08-24 NOTE — ED Notes (Signed)
Pt to the er for difficulty with urination. Pt has pain in both kidneys and over the bladder. Pt denies pain with urination but pain difficulty initiating a stream and the flow is not steady. Pt denies kidney problems and they were going to do a biopsy of the prostate but decided against due to thrombocytopenia

## 2018-08-24 NOTE — ED Triage Notes (Signed)
Pt states pain in his bladder area, abdominal cramping and diarrhea that started a few days ago, pt appears pale, states it has been hard for him to pee the past few days.

## 2018-08-24 NOTE — Discharge Instructions (Addendum)
Please change your leg bag as instructed by the nurse today.  Please follow-up with your primary care physician and bring a stool sample if you continue to have diarrhea.  You may take Zofran for nausea, and Imodium for diarrhea.  Please return to the emergency department if you develop decreased output from your leg bag, pain in the abdomen, nausea or vomiting, fever, increased bleeding, diarrhea, lightheadedness or fainting, or any other symptoms concerning to you.

## 2018-08-24 NOTE — ED Notes (Signed)
Pt switched to leg bag cath Pt sent home with specimen cup for cdiff sample  Pt IV removed completely from left ac  Lm edt

## 2018-08-24 NOTE — ED Notes (Signed)
Foley placed. Output 350 ml. Bladder scan repeated and showed 11 ml. Pt tolerated well. IV placed. Labs drawn and sent with white labels due to screen saying pt being locked out and unable to print yellow labels.

## 2018-08-24 NOTE — ED Provider Notes (Signed)
[facility] Emergency Department Provider Note  ____________________________________________  Time seen: Approximately 5:12 PM  I have reviewed the triage vital signs and the nursing notes.   HISTORY  Chief Complaint Diarrhea    HPI Joshua Berry is a 72 y.o. male with a history of cirrhosis, DM, HTN, thrombocytopenia, and skin cancer presenting for diarrhea.  The patient reports that for the past 3 days he is having loose, watery diarrhea with occasional blood streaks that started yesterday.  He does have a history of hemorrhoids and feels that the blood is most consistent with bleeding hemorrhoids.  He has not had any abdominal pain; he has had intermittent mild nausea without vomiting.  He has not recently had any antibiotics except for one intravenous dose 8/28 when he was evaluated with fever after skin biopsy.  He lives in rehab facility and is not aware of any other contact with patients that have had diarrheal illnesses.  He has tried loperamide which decrease the frequency of his diarrhea.  The patient also reports some difficulty with urination; difficult to initiate stream, no true dysuria.  Past Medical History:  Diagnosis Date  . Arthritis   . Cancer (HCC)    melanoma, carcinoma  . Cirrhosis of liver ([place])   . Diabetes mellitus without complication ([place])   . Hypertension   . Spleen enlarged   . Thrombocytopenia ([place])     There are no active problems to display for this patient.   Past Surgical History:  Procedure Laterality Date  . CHOLECYSTECTOMY    . SKIN BIOPSY      Current Outpatient Rx  . Order #: [id number] Class: Historical Med  . Order #: [id number] Class: Historical Med  . Order #: [id number] Class: Historical Med  . Order #: [id number] Class: Historical Med  . Order #: [id number] Class: Print  . Order #: [id number] Class: Historical Med  . Order #: [id number] Class: Print  . Order #: [id number] Class: Normal  . Order #: [id number] Class:  Print  . Order #: [id number] Class: Print    Allergies Lisinopril; Sulfamethoxazole; Camphor; Latex; and Nickel  No family history on file.  Social History Social History   Tobacco Use  . Smoking status: Never Smoker  . Smokeless tobacco: Never Used  Substance Use Topics  . Alcohol use: Not on file  . Drug use: Not on file    Review of Systems Constitutional: No fever/chills.  No lightheadedness or syncope. Eyes: No visual changes. ENT: No sore throat. No congestion or rhinorrhea. Cardiovascular: Denies chest pain. Denies palpitations. Respiratory: Denies shortness of breath.  No cough. Gastrointestinal: No abdominal pain.  Positive nausea, no vomiting.  Positive watery, blood-streaked diarrhea.  Positive hemorrhoids.  No constipation. Genitourinary: Negative for dysuria.  Positive difficulty initiating urinary stream. Musculoskeletal: Negative for back pain. Skin: Negative for rash. Neurological: Negative for headaches. No focal numbness, tingling or weakness.     ____________________________________________   PHYSICAL EXAM:  VITAL SIGNS: ED Triage Vitals  Enc Vitals Group     BP 08/24/18 1418 136/64     Pulse Rate 08/24/18 1418 68     Resp 08/24/18 1418 16     Temp 08/24/18 1418 98.8 F (37.1 C)     Temp Source 08/24/18 1418 Oral     SpO2 08/24/18 1418 96 %     Weight 08/24/18 1419 262 lb (118.8 kg)     Height 08/24/18 1419 6' 2"  (1.88 m)     Head Circumference --      Peak Flow --  Pain Score 08/24/18 1418 7     Pain Loc --      Pain Edu? --      Excl. in [place]? --     Constitutional: Alert and oriented.  Answers questions appropriately. Eyes: Conjunctivae are normal and without pallor.  EOMI. No scleral icterus. Head: Atraumatic. Nose: No congestion/rhinnorhea. Mouth/Throat: Mucous membranes are moist.  Neck: No stridor.  Supple.  No JVD.  No meningismus. Cardiovascular: Normal rate, regular rhythm. No murmurs, rubs or gallops.  Respiratory: Normal  respiratory effort.  No accessory muscle use or retractions. Lungs CTAB.  No wheezes, rales or ronchi. Gastrointestinal: Overweight.  Soft, nontender and nondistended.  No guarding or rebound.  No peritoneal signs. Genitourinary: Positive multiple nonbleeding nonthrombosed external hemorrhoids; positive palpable internal hemorrhoids.  Stool is brown and guaiac positive. Musculoskeletal: No LE edema. Neurologic:  A&Ox3.  Speech is clear.  Face and smile are symmetric.  EOMI.  Moves all extremities well. Skin:  Skin is warm, dry and intact. No rash noted. Psychiatric: Mood and affect are normal. Speech and behavior are normal.  Normal judgement  ____________________________________________   LABS (all labs ordered are listed, but only abnormal results are displayed)  Labs Reviewed  COMPREHENSIVE METABOLIC PANEL - Abnormal; Notable for the following components:      Result Value   Glucose, Bld 250 (*)    Calcium 8.8 (*)    Total Bilirubin 1.4 (*)    All other components within normal limits  CBC - Abnormal; Notable for the following components:   RBC 3.52 (*)    Hemoglobin 11.3 (*)    HCT 32.4 (*)    RDW 16.7 (*)    Platelets 62 (*)    All other components within normal limits  URINALYSIS, COMPLETE (UACMP) WITH MICROSCOPIC - Abnormal; Notable for the following components:   Color, Urine YELLOW (*)    APPearance CLEAR (*)    Glucose, UA 50 (*)    Protein, ur 100 (*)    All other components within normal limits  PROTIME-INR - Abnormal; Notable for the following components:   Prothrombin Time 15.7 (*)    All other components within normal limits  C DIFFICILE QUICK SCREEN W PCR REFLEX  GASTROINTESTINAL PANEL BY PCR, STOOL (REPLACES STOOL CULTURE)  LIPASE, BLOOD  APTT   ____________________________________________  EKG  Not indicated ____________________________________________  RADIOLOGY  No results  found.  ____________________________________________   PROCEDURES  Procedure(s) performed: None  Procedures  Critical Care performed: No ____________________________________________   INITIAL IMPRESSION / ASSESSMENT AND PLAN / ED COURSE  Pertinent labs & imaging results that were available during my care of the patient were reviewed by me and considered in my medical decision making (see chart for details).  72 y.o. male with a history of cirrhosis, thrombocytopenia presenting for diarrhea with blood streaks for the past 3 days.  Overall, the patient is hemodynamically stable.  He is not having any abdominal discomfort and my abdominal examination does not reveal any tenderness.  It is possible that he is a viral GI illness, although bacterial infection or colitis is also possible.  C. difficile and stool studies have been ordered.  The patient is hemodynamically stable, his stool is brown although it is guaiac positive, he has visible and palpable hemorrhoids, and his hemoglobin remains 11.3 which is baseline for him.  Today platelets are 56 which is also baseline for him.  I do not see any evidence of a significant GI bleed, and it is likely  that the blood is from his hemorrhoids.  Plan intravenous fluids with stool studies and reevaluation.  CT imaging is not indicated at this time.  ----------------------------------------- 8:02 PM on 08/24/2018 -----------------------------------------  The patient is feeling significantly better at this time.  He has not had any more episodes of diarrhea since his arrival.  He did have evidence of urinary retention, with an output of 350 cc with Foley placement after not being able to void at all.  A foley catheter has been placed now he was discharged home with a leg bag.  I have talked to the patient about his results, as well as follow-up instructions and discharge precautions.  The patient is safe for discharge at this  time  ____________________________________________  FINAL CLINICAL IMPRESSION(S) / ED DIAGNOSES  Final diagnoses:  Diarrhea, unspecified type  Nausea without vomiting  Internal hemorrhoids  External hemorrhoids  Hyperglycemia  Urinary retention         NEW MEDICATIONS STARTED DURING THIS VISIT:  New Prescriptions   HYDROCORTISONE (ANUSOL-HC) 2.5 % RECTAL CREAM    Apply rectally 2 times daily as needed for rectal pain or bleeding   LOPERAMIDE (IMODIUM A-D) 2 MG TABLET    Take 1 tablet (2 mg total) by mouth 4 (four) times daily as needed for diarrhea or loose stools.   ONDANSETRON (ZOFRAN ODT) 4 MG DISINTEGRATING TABLET    Take 1 tablet (4 mg total) by mouth every 8 (eight) hours as needed for nausea or vomiting.      Eula Listen, MD 08/24/18 2003

## 2018-08-24 NOTE — ED Notes (Signed)
Bladder scanned: 229m

## 2018-08-24 NOTE — ED Notes (Signed)
Bladder scan shows 351

## 2018-09-08 ENCOUNTER — Emergency Department
Admission: EM | Admit: 2018-09-08 | Discharge: 2018-09-08 | Disposition: A | Discharge status: Home / Self Care | Payer: Medicare Other | Attending: Emergency Medicine | Admitting: Emergency Medicine

## 2018-09-08 ENCOUNTER — Encounter: Payer: Self-pay | Admitting: Emergency Medicine

## 2018-09-08 ENCOUNTER — Other Ambulatory Visit: Payer: Self-pay

## 2018-09-08 DIAGNOSIS — I10 Essential (primary) hypertension: Secondary | ICD-10-CM | POA: Insufficient documentation

## 2018-09-08 DIAGNOSIS — R1084 Generalized abdominal pain: Secondary | ICD-10-CM | POA: Insufficient documentation

## 2018-09-08 DIAGNOSIS — R350 Frequency of micturition: Secondary | ICD-10-CM | POA: Insufficient documentation

## 2018-09-08 DIAGNOSIS — E119 Type 2 diabetes mellitus without complications: Secondary | ICD-10-CM | POA: Insufficient documentation

## 2018-09-08 DIAGNOSIS — Z79899 Other long term (current) drug therapy: Secondary | ICD-10-CM | POA: Insufficient documentation

## 2018-09-08 DIAGNOSIS — Z794 Long term (current) use of insulin: Secondary | ICD-10-CM | POA: Insufficient documentation

## 2018-09-08 LAB — URINALYSIS, COMPLETE (UACMP) WITH MICROSCOPIC
Bilirubin Urine: NEGATIVE
Glucose, UA: NEGATIVE mg/dL
Hgb urine dipstick: NEGATIVE
Ketones, ur: NEGATIVE mg/dL
Leukocytes, UA: NEGATIVE
Nitrite: NEGATIVE
Protein, ur: 30 mg/dL — AB
Specific Gravity, Urine: 1.015 (ref 1.005–1.030)
pH: 6 (ref 5.0–8.0)

## 2018-09-08 MED ORDER — PHENAZOPYRIDINE HCL 200 MG PO TABS: 200.0000 mg | ORAL_TABLET | Freq: Three times a day (TID) | ORAL | 0 refills | Status: DC | PRN

## 2018-09-08 MED ORDER — PHENAZOPYRIDINE HCL 100 MG PO TABS
95.0000 mg | ORAL_TABLET | Freq: Once | ORAL | Status: AC
  Administered 2018-09-08: 100 mg via ORAL
  Filled 2018-09-08: qty 1

## 2018-09-08 NOTE — ED Provider Notes (Signed)
[facility] Emergency Department Provider Note   ____________________________________________   First MD Initiated Contact with Patient 09/08/18 1130     (approximate)  I have reviewed the triage vital signs and the nursing notes.   HISTORY  Chief Complaint Urinary Frequency    HPI Joshua Berry is a 72 y.o. male patient presented urethral irritation.  Patient had a Foley inserted for for 8 days.  Foley was removed 4 days ago.  Patient states since removal he has increased frequency and lower abdominal discomfort.  Patient state he is unsure his bladder is completely empty when he voids.  Patient denies fever or urethral discharge.  Patient rates his discomfort a 2/4.  Patient described as "mild aching".  No palliative measures for this complaint.   Past Medical History:  Diagnosis Date  . Arthritis   . Cancer (HCC)    melanoma, carcinoma  . Cirrhosis of liver ([place])   . Diabetes mellitus without complication ([place])   . Hypertension   . Spleen enlarged   . Thrombocytopenia ([place])     There are no active problems to display for this patient.   Past Surgical History:  Procedure Laterality Date  . CHOLECYSTECTOMY    . SKIN BIOPSY      Prior to Admission medications   Medication Sig Start Date End Date Taking? Authorizing Provider  albuterol (PROVENTIL, VENTOLIN) (5 MG/ML) 0.5% NEBU Inhale 2 puffs into the lungs once.   Yes [provider]  atorvastatin (LIPITOR) 10 MG tablet Take 10 mg by mouth daily.   Yes [provider]  hydrocortisone (ANUSOL-HC) 2.5 % rectal cream Apply rectally 2 times daily as needed for rectal pain or bleeding 08/24/18 08/24/19 Yes Eula Listen, MD  insulin NPH-regular Human (NOVOLIN 70/30) (70-30) 100 UNIT/ML injection Inject 60 Units into the skin daily with breakfast.    Yes [provider]  levothyroxine (SYNTHROID, LEVOTHROID) 100 MCG tablet Take 100 mcg by mouth daily before breakfast.    Yes [provider]  loperamide (IMODIUM A-D) 2 MG tablet Take 1 tablet (2 mg total) by mouth 4 (four) times daily as needed for diarrhea or loose stools. 08/24/18  Yes Eula Listen, MD  omeprazole (PRILOSEC) 20 MG capsule Take 20 mg by mouth daily.   Yes [provider]  ipratropium-albuterol (DUONEB) 0.5-2.5 (3) MG/3ML SOLN Take 3 mLs by nebulization every 4 (four) hours as needed.     [provider]  metFORMIN (GLUMETZA) 500 MG (MOD) 24 hr tablet Take 500 mg by mouth at bedtime.    [provider]  ondansetron (ZOFRAN ODT) 4 MG disintegrating tablet Take 1 tablet (4 mg total) by mouth every 8 (eight) hours as needed for nausea or vomiting. 08/24/18   Eula Listen, MD  oxyCODONE (OXYCONTIN) 10 mg 12 hr tablet Take 1 tablet (10 mg total) by mouth every 12 (twelve) hours. Patient not taking: Reported on 08/24/2018 07/13/18   Cuthriell, Charline Bills, PA-C  phenazopyridine (PYRIDIUM) 200 MG tablet Take 1 tablet (200 mg total) by mouth 3 (three) times daily as needed for pain. 09/08/18   Sable Feil, PA-C  predniSONE (DELTASONE) 50 MG tablet Take 1 tablet (50 mg total) by mouth daily with breakfast. Patient not taking: Reported on 08/24/2018 07/13/18   Cuthriell, Charline Bills, PA-C    Allergies Lisinopril; Sulfamethoxazole; Camphor; Latex; and Nickel  No family history on file.  Social History Social History   Tobacco Use  . Smoking status: Never Smoker  .  Smokeless tobacco: Never Used  Substance Use Topics  . Alcohol use: Not on file  . Drug use: Not on file    Review of Systems  Constitutional: No fever/chills Eyes: No visual changes. ENT: No sore throat. Cardiovascular: Denies chest pain. Respiratory: Denies shortness of breath. Gastrointestinal: No abdominal pain.  No nausea, no vomiting.  No diarrhea.  No constipation. Genitourinary: Negative for dysuria. Musculoskeletal: Negative for back pain. Skin: Negative for  rash. Neurological: Negative for headaches, focal weakness or numbness. Endocrine:Diabetes and hypertension.  ____________________________________________   PHYSICAL EXAM:  VITAL SIGNS: ED Triage Vitals  Enc Vitals Group     BP 09/08/18 1107 115/62     Pulse Rate 09/08/18 1107 61     Resp 09/08/18 1107 18     Temp 09/08/18 1107 98.8 F (37.1 C)     Temp Source 09/08/18 1107 Oral     SpO2 09/08/18 1107 97 %     Weight 09/08/18 1108 260 lb 2.3 oz (118 kg)     Height 09/08/18 1108 6' 2"  (1.88 m)     Head Circumference --      Peak Flow --      Pain Score 09/08/18 1107 2     Pain Loc --      Pain Edu? --      Excl. in [place]? --    Constitutional: Alert and oriented. Well appearing and in no acute distress. Cardiovascular: Normal rate, regular rhythm. Grossly normal heart sounds.  Good peripheral circulation. Respiratory: Normal respiratory effort.  No retractions. Lungs CTAB. Gastrointestinal: Soft and nontender. No distention. No abdominal bruits. No CVA tenderness. Genitourinary: No edema or erythema.  No urethral drainage. Neurologic:  Normal speech and language. No gross focal neurologic deficits are appreciated. No gait instability. Skin:  Skin is warm, dry and intact. No rash noted. Psychiatric: Mood and affect are normal. Speech and behavior are normal.  ____________________________________________   LABS (all labs ordered are listed, but only abnormal results are displayed)  Labs Reviewed  URINALYSIS, COMPLETE (UACMP) WITH MICROSCOPIC - Abnormal; Notable for the following components:      Result Value   Color, Urine YELLOW (*)    APPearance CLEAR (*)    Protein, ur 30 (*)    Bacteria, UA RARE (*)    All other components within normal limits   ____________________________________________  EKG   ____________________________________________  RADIOLOGY  ED MD interpretation:    Official radiology report(s): No results  found.  ____________________________________________   PROCEDURES  Procedure(s) performed:   Procedures  Critical Care performed: No  ____________________________________________   INITIAL IMPRESSION / ASSESSMENT AND PLAN / ED COURSE  As part of my medical decision making, I reviewed the following data within the [place]    Patient presents with urinary frequency and sensation of not being completely empty when he void.  Bladder scan was unremarkable.  Discussed negative urinalysis with patient.  Advised patient we will treat his urinary frequency and irritation with Pyridium.  Patient advised follow-up PCP if condition persist.      ____________________________________________   FINAL CLINICAL IMPRESSION(S) / ED DIAGNOSES  Final diagnoses:  Urinary frequency     ED Discharge Orders         Ordered    phenazopyridine (PYRIDIUM) 200 MG tablet  3 times daily PRN     09/08/18 1207           Note:  This document was prepared using Dragon voice recognition software and  may include unintentional dictation errors.    Sable Feil, PA-C 09/08/18 1215    Earleen Newport, MD 09/08/18 [phone]

## 2018-09-08 NOTE — ED Notes (Signed)
Bladder scanned with 0 residual in bladder.

## 2018-09-08 NOTE — ED Notes (Signed)
Says discomfort with urination.  Had foley in for 8 days until last Tuesday.  Patient amulated to room without difficulty.

## 2018-09-08 NOTE — Discharge Instructions (Addendum)
Your urinalysis negative for signs of infection.  The medicine you are taking for irritation will cause your urine to be red.

## 2018-09-08 NOTE — ED Triage Notes (Signed)
Patient reports that he had foley placed 2 mondays ago for urinary retention that stayed in for 8 days until he was able to follow up at [place]. States This past Friday he started having increased frequency and discomfort in lower abdomen. States he is unsure if he is emptying his bladder completely. Denies dysuria at this time. Denies fever.

## 2018-12-19 ENCOUNTER — Emergency Department: Payer: Medicare Other

## 2018-12-19 ENCOUNTER — Encounter: Payer: Self-pay | Admitting: Emergency Medicine

## 2018-12-19 DIAGNOSIS — E119 Type 2 diabetes mellitus without complications: Secondary | ICD-10-CM | POA: Insufficient documentation

## 2018-12-19 DIAGNOSIS — Z85820 Personal history of malignant melanoma of skin: Secondary | ICD-10-CM | POA: Insufficient documentation

## 2018-12-19 DIAGNOSIS — I10 Essential (primary) hypertension: Secondary | ICD-10-CM

## 2018-12-19 DIAGNOSIS — Z794 Long term (current) use of insulin: Secondary | ICD-10-CM | POA: Insufficient documentation

## 2018-12-19 DIAGNOSIS — Z87891 Personal history of nicotine dependence: Secondary | ICD-10-CM | POA: Insufficient documentation

## 2018-12-19 DIAGNOSIS — D696 Thrombocytopenia, unspecified: Secondary | ICD-10-CM

## 2018-12-19 DIAGNOSIS — A419 Sepsis, unspecified organism: Secondary | ICD-10-CM | POA: Diagnosis not present

## 2018-12-19 DIAGNOSIS — R0600 Dyspnea, unspecified: Secondary | ICD-10-CM | POA: Insufficient documentation

## 2018-12-19 DIAGNOSIS — Z91040 Latex allergy status: Secondary | ICD-10-CM

## 2018-12-19 DIAGNOSIS — A401 Sepsis due to streptococcus, group B: Secondary | ICD-10-CM | POA: Diagnosis not present

## 2018-12-19 DIAGNOSIS — Z79899 Other long term (current) drug therapy: Secondary | ICD-10-CM | POA: Insufficient documentation

## 2018-12-19 LAB — COMPREHENSIVE METABOLIC PANEL
ALBUMIN: 3.9 g/dL (ref 3.5–5.0)
ALT: 29 U/L (ref 0–44)
ANION GAP: 7 (ref 5–15)
AST: 42 U/L — ABNORMAL HIGH (ref 15–41)
Alkaline Phosphatase: 76 U/L (ref 38–126)
BUN: 14 mg/dL (ref 8–23)
CALCIUM: 9.1 mg/dL (ref 8.9–10.3)
CHLORIDE: 104 mmol/L (ref 98–111)
CO2: 23 mmol/L (ref 22–32)
CREATININE: 0.97 mg/dL (ref 0.61–1.24)
GFR calc Af Amer: >60 mL/min (ref >60)
GFR calc non Af Amer: >60 mL/min (ref >60)
GLUCOSE: 155 mg/dL — AB (ref 70–99)
Potassium: 3.6 mmol/L (ref 3.5–5.1)
SODIUM: 134 mmol/L — AB (ref 135–145)
Total Bilirubin: 1.2 mg/dL (ref 0.3–1.2)
Total Protein: 8 g/dL (ref 6.5–8.1)

## 2018-12-19 LAB — CBC WITH DIFFERENTIAL/PLATELET
Abs Immature Granulocytes: 0.02 10*3/uL (ref 0.00–0.07)
BASOS ABS: 0 10*3/uL (ref 0.0–0.1)
Basophils Relative: 0 %
EOS ABS: 0.1 10*3/uL (ref 0.0–0.5)
Eosinophils Relative: 1 %
HEMATOCRIT: 32.4 % — AB (ref 39.0–52.0)
HEMOGLOBIN: 10.5 g/dL — AB (ref 13.0–17.0)
IMMATURE GRANULOCYTES: 0 %
LYMPHS ABS: 0.5 10*3/uL — AB (ref 0.7–4.0)
Lymphocytes Relative: 11 %
MCH: 29.1 pg (ref 26.0–34.0)
MCHC: 32.4 g/dL (ref 30.0–36.0)
MCV: 89.8 fL (ref 80.0–100.0)
MONO ABS: 0.4 10*3/uL (ref 0.1–1.0)
Monocytes Relative: 8 %
NEUTROS ABS: 3.8 10*3/uL (ref 1.7–7.7)
NEUTROS PCT: 80 %
Platelets: 50 10*3/uL — ABNORMAL LOW (ref 150–400)
RBC: 3.61 MIL/uL — ABNORMAL LOW (ref 4.22–5.81)
RDW: 17.1 % — AB (ref 11.5–15.5)
WBC: 4.8 10*3/uL (ref 4.0–10.5)
nRBC: 0 % (ref 0.0–0.2)

## 2018-12-19 LAB — URINALYSIS, COMPLETE (UACMP) WITH MICROSCOPIC
BILIRUBIN URINE: NEGATIVE
Bacteria, UA: NONE SEEN
Glucose, UA: NEGATIVE mg/dL
Ketones, ur: 5 mg/dL — AB
Leukocytes, UA: NEGATIVE
Nitrite: NEGATIVE
PROTEIN: 100 mg/dL — AB
Specific Gravity, Urine: 1.02 (ref 1.005–1.030)
Squamous Epithelial / HPF: NONE SEEN (ref 0–5)
pH: 6 (ref 5.0–8.0)

## 2018-12-19 LAB — INFLUENZA PANEL BY PCR (TYPE A & B)
Influenza A By PCR: NEGATIVE
Influenza B By PCR: NEGATIVE

## 2018-12-19 MED ORDER — ACETAMINOPHEN 500 MG PO TABS
1000.0000 mg | ORAL_TABLET | Freq: Once | ORAL | Status: AC
  Administered 2018-12-19: 1000 mg via ORAL

## 2018-12-19 MED ORDER — ACETAMINOPHEN 500 MG PO TABS
ORAL_TABLET | ORAL | Status: AC
  Administered 2018-12-20: 05:00:00
  Filled 2018-12-19: qty 2

## 2018-12-19 NOTE — ED Triage Notes (Addendum)
Patient with complaint of fever and generalized body aches that started yesterday. Patient reports fever of 101.5 at home. Patient states that he took 500 mg tylenol at 17:30.

## 2018-12-19 NOTE — ED Notes (Signed)
This EDT sent to lab a lav/green top blood specimen, 1 set of cultures, and 1 flu swab

## 2018-12-19 NOTE — ED Notes (Signed)
I-stat lactic= 1.85 (RN, [place]  notified)

## 2018-12-20 ENCOUNTER — Emergency Department: Payer: Medicare Other

## 2018-12-20 ENCOUNTER — Encounter: Payer: Self-pay | Admitting: Emergency Medicine

## 2018-12-20 ENCOUNTER — Emergency Department
Admission: EM | Admit: 2018-12-20 | Discharge: 2018-12-20 | Disposition: A | Discharge status: Home / Self Care | Payer: Medicare Other | Source: Home / Self Care | Attending: Emergency Medicine | Admitting: Emergency Medicine

## 2018-12-20 ENCOUNTER — Inpatient Hospital Stay
Admission: EM | Admit: 2018-12-20 | Discharge: 2018-12-25 | DRG: 872 | Disposition: A | Discharge status: Home Health | Payer: Medicare Other | Attending: Internal Medicine | Admitting: Internal Medicine

## 2018-12-20 DIAGNOSIS — E119 Type 2 diabetes mellitus without complications: Secondary | ICD-10-CM | POA: Diagnosis present

## 2018-12-20 DIAGNOSIS — M542 Cervicalgia: Secondary | ICD-10-CM | POA: Diagnosis not present

## 2018-12-20 DIAGNOSIS — K7469 Other cirrhosis of liver: Secondary | ICD-10-CM | POA: Diagnosis present

## 2018-12-20 DIAGNOSIS — R6520 Severe sepsis without septic shock: Secondary | ICD-10-CM | POA: Diagnosis present

## 2018-12-20 DIAGNOSIS — Z85820 Personal history of malignant melanoma of skin: Secondary | ICD-10-CM

## 2018-12-20 DIAGNOSIS — Z79890 Hormone replacement therapy (postmenopausal): Secondary | ICD-10-CM

## 2018-12-20 DIAGNOSIS — I10 Essential (primary) hypertension: Secondary | ICD-10-CM | POA: Diagnosis present

## 2018-12-20 DIAGNOSIS — A401 Sepsis due to streptococcus, group B: Secondary | ICD-10-CM | POA: Diagnosis present

## 2018-12-20 DIAGNOSIS — Z95828 Presence of other vascular implants and grafts: Secondary | ICD-10-CM

## 2018-12-20 DIAGNOSIS — M19011 Primary osteoarthritis, right shoulder: Secondary | ICD-10-CM | POA: Diagnosis present

## 2018-12-20 DIAGNOSIS — E039 Hypothyroidism, unspecified: Secondary | ICD-10-CM | POA: Diagnosis present

## 2018-12-20 DIAGNOSIS — R7881 Bacteremia: Secondary | ICD-10-CM

## 2018-12-20 DIAGNOSIS — A419 Sepsis, unspecified organism: Secondary | ICD-10-CM | POA: Diagnosis present

## 2018-12-20 DIAGNOSIS — M47812 Spondylosis without myelopathy or radiculopathy, cervical region: Secondary | ICD-10-CM | POA: Diagnosis present

## 2018-12-20 DIAGNOSIS — E785 Hyperlipidemia, unspecified: Secondary | ICD-10-CM | POA: Diagnosis present

## 2018-12-20 DIAGNOSIS — Z87891 Personal history of nicotine dependence: Secondary | ICD-10-CM

## 2018-12-20 DIAGNOSIS — Z794 Long term (current) use of insulin: Secondary | ICD-10-CM

## 2018-12-20 DIAGNOSIS — M609 Myositis, unspecified: Secondary | ICD-10-CM | POA: Diagnosis not present

## 2018-12-20 DIAGNOSIS — R4182 Altered mental status, unspecified: Secondary | ICD-10-CM | POA: Diagnosis present

## 2018-12-20 DIAGNOSIS — M4802 Spinal stenosis, cervical region: Secondary | ICD-10-CM | POA: Diagnosis present

## 2018-12-20 DIAGNOSIS — G8929 Other chronic pain: Secondary | ICD-10-CM | POA: Diagnosis present

## 2018-12-20 DIAGNOSIS — E876 Hypokalemia: Secondary | ICD-10-CM | POA: Diagnosis present

## 2018-12-20 DIAGNOSIS — B951 Streptococcus, group B, as the cause of diseases classified elsewhere: Secondary | ICD-10-CM

## 2018-12-20 DIAGNOSIS — D696 Thrombocytopenia, unspecified: Secondary | ICD-10-CM | POA: Diagnosis present

## 2018-12-20 DIAGNOSIS — Z96612 Presence of left artificial shoulder joint: Secondary | ICD-10-CM | POA: Diagnosis present

## 2018-12-20 DIAGNOSIS — K7460 Unspecified cirrhosis of liver: Secondary | ICD-10-CM

## 2018-12-20 DIAGNOSIS — M170 Bilateral primary osteoarthritis of knee: Secondary | ICD-10-CM | POA: Diagnosis not present

## 2018-12-20 DIAGNOSIS — K7581 Nonalcoholic steatohepatitis (NASH): Secondary | ICD-10-CM | POA: Diagnosis present

## 2018-12-20 DIAGNOSIS — M25511 Pain in right shoulder: Secondary | ICD-10-CM | POA: Diagnosis not present

## 2018-12-20 DIAGNOSIS — K0889 Other specified disorders of teeth and supporting structures: Secondary | ICD-10-CM | POA: Diagnosis not present

## 2018-12-20 DIAGNOSIS — R234 Changes in skin texture: Secondary | ICD-10-CM | POA: Diagnosis not present

## 2018-12-20 DIAGNOSIS — R509 Fever, unspecified: Secondary | ICD-10-CM

## 2018-12-20 LAB — CG4 I-STAT (LACTIC ACID): Lactic Acid, Venous: 1.85 mmol/L (ref 0.5–1.9)

## 2018-12-20 LAB — BLOOD CULTURE ID PANEL (REFLEXED)
Acinetobacter baumannii: NOT DETECTED
CANDIDA PARAPSILOSIS: NOT DETECTED
Candida albicans: NOT DETECTED
Candida glabrata: NOT DETECTED
Candida krusei: NOT DETECTED
Candida tropicalis: NOT DETECTED
Enterobacter cloacae complex: NOT DETECTED
Enterobacteriaceae species: NOT DETECTED
Enterococcus species: NOT DETECTED
Escherichia coli: NOT DETECTED
HAEMOPHILUS INFLUENZAE: NOT DETECTED
Klebsiella oxytoca: NOT DETECTED
Klebsiella pneumoniae: NOT DETECTED
Listeria monocytogenes: NOT DETECTED
Neisseria meningitidis: NOT DETECTED
Proteus species: NOT DETECTED
Pseudomonas aeruginosa: NOT DETECTED
Serratia marcescens: NOT DETECTED
Staphylococcus aureus (BCID): NOT DETECTED
Staphylococcus species: NOT DETECTED
Streptococcus agalactiae: DETECTED — AB
Streptococcus pneumoniae: NOT DETECTED
Streptococcus pyogenes: NOT DETECTED
Streptococcus species: DETECTED — AB

## 2018-12-20 LAB — COMPREHENSIVE METABOLIC PANEL
ALT: 36 U/L (ref 0–44)
AST: 60 U/L — ABNORMAL HIGH (ref 15–41)
Albumin: 3.7 g/dL (ref 3.5–5.0)
Alkaline Phosphatase: 74 U/L (ref 38–126)
Anion gap: 9 (ref 5–15)
BUN: 16 mg/dL (ref 8–23)
CO2: 22 mmol/L (ref 22–32)
Calcium: 8.8 mg/dL — ABNORMAL LOW (ref 8.9–10.3)
Chloride: 105 mmol/L (ref 98–111)
Creatinine, Ser: 1.08 mg/dL (ref 0.61–1.24)
GFR calc Af Amer: >60 mL/min (ref >60)
GFR calc non Af Amer: >60 mL/min (ref >60)
Glucose, Bld: 153 mg/dL — ABNORMAL HIGH (ref 70–99)
Potassium: 3.6 mmol/L (ref 3.5–5.1)
SODIUM: 136 mmol/L (ref 135–145)
Total Bilirubin: 2 mg/dL — ABNORMAL HIGH (ref 0.3–1.2)
Total Protein: 7.3 g/dL (ref 6.5–8.1)

## 2018-12-20 LAB — LACTIC ACID, PLASMA
LACTIC ACID, VENOUS: 2.9 mmol/L — AB (ref 0.5–1.9)
Lactic Acid, Venous: 2.5 mmol/L (ref 0.5–1.9)

## 2018-12-20 LAB — PROTIME-INR
INR: 1.29
Prothrombin Time: 16 seconds — ABNORMAL HIGH (ref 11.4–15.2)

## 2018-12-20 LAB — PROCALCITONIN: PROCALCITONIN: 1.96 ng/mL

## 2018-12-20 LAB — CBC WITH DIFFERENTIAL/PLATELET
Abs Immature Granulocytes: 0.08 10*3/uL — ABNORMAL HIGH (ref 0.00–0.07)
BASOS ABS: 0 10*3/uL (ref 0.0–0.1)
Basophils Relative: 0 %
Eosinophils Absolute: 0 10*3/uL (ref 0.0–0.5)
Eosinophils Relative: 0 %
HCT: 30.6 % — ABNORMAL LOW (ref 39.0–52.0)
Hemoglobin: 10.3 g/dL — ABNORMAL LOW (ref 13.0–17.0)
IMMATURE GRANULOCYTES: 1 %
Lymphocytes Relative: 7 %
Lymphs Abs: 0.7 10*3/uL (ref 0.7–4.0)
MCH: 29.8 pg (ref 26.0–34.0)
MCHC: 33.7 g/dL (ref 30.0–36.0)
MCV: 88.4 fL (ref 80.0–100.0)
Monocytes Absolute: 0.8 10*3/uL (ref 0.1–1.0)
Monocytes Relative: 8 %
NEUTROS ABS: 8.5 10*3/uL — AB (ref 1.7–7.7)
Neutrophils Relative %: 84 %
Platelets: 41 10*3/uL — ABNORMAL LOW (ref 150–400)
RBC: 3.46 MIL/uL — ABNORMAL LOW (ref 4.22–5.81)
RDW: 17.2 % — ABNORMAL HIGH (ref 11.5–15.5)
WBC: 10.1 10*3/uL (ref 4.0–10.5)
nRBC: 0 % (ref 0.0–0.2)

## 2018-12-20 LAB — AMMONIA
Ammonia: 29 umol/L (ref 9–35)
Ammonia: 25 umol/L (ref 9–35)

## 2018-12-20 LAB — GLUCOSE, CAPILLARY
Glucose-Capillary: 115 mg/dL — ABNORMAL HIGH (ref 70–99)
Glucose-Capillary: 200 mg/dL — ABNORMAL HIGH (ref 70–99)

## 2018-12-20 MED ORDER — BISACODYL 5 MG PO TBEC
5.0000 mg | DELAYED_RELEASE_TABLET | Freq: Every day | ORAL | Status: DC | PRN
  Administered 2018-12-25: 08:00:00 5 mg via ORAL
  Filled 2018-12-20: qty 1

## 2018-12-20 MED ORDER — SODIUM CHLORIDE 0.9 % IV SOLN
1.0000 g | INTRAVENOUS | Status: DC
  Administered 2018-12-21: 02:00:00 1 g via INTRAVENOUS
  Filled 2018-12-20: qty 10
  Filled 2018-12-20: qty 1

## 2018-12-20 MED ORDER — SODIUM CHLORIDE 0.9 % IV BOLUS
1000.0000 mL | Freq: Once | INTRAVENOUS | Status: AC
  Administered 2018-12-20: 1000 mL via INTRAVENOUS

## 2018-12-20 MED ORDER — IPRATROPIUM-ALBUTEROL 0.5-2.5 (3) MG/3ML IN SOLN: 3.0000 mL | RESPIRATORY_TRACT | Status: DC | PRN

## 2018-12-20 MED ORDER — ONDANSETRON HCL 4 MG PO TABS: 4.0000 mg | ORAL_TABLET | Freq: Four times a day (QID) | ORAL | Status: DC | PRN

## 2018-12-20 MED ORDER — ACETAMINOPHEN 325 MG PO TABS
650.0000 mg | ORAL_TABLET | Freq: Four times a day (QID) | ORAL | Status: DC | PRN
  Administered 2018-12-20 – 2018-12-24 (×5): 650 mg via ORAL
  Filled 2018-12-20 (×5): qty 2

## 2018-12-20 MED ORDER — METRONIDAZOLE IN NACL 5-0.79 MG/ML-% IV SOLN
500.0000 mg | Freq: Three times a day (TID) | INTRAVENOUS | Status: DC
  Administered 2018-12-20 – 2018-12-21 (×2): 500 mg via INTRAVENOUS
  Filled 2018-12-20 (×4): qty 100

## 2018-12-20 MED ORDER — TRAZODONE HCL 50 MG PO TABS
25.0000 mg | ORAL_TABLET | Freq: Every evening | ORAL | Status: DC | PRN
  Administered 2018-12-25: 25 mg via ORAL
  Filled 2018-12-20: qty 1

## 2018-12-20 MED ORDER — SODIUM CHLORIDE 0.9 % IV SOLN
1.0000 g | Freq: Once | INTRAVENOUS | Status: DC
  Filled 2018-12-20: qty 1000

## 2018-12-20 MED ORDER — ATORVASTATIN CALCIUM 20 MG PO TABS
10.0000 mg | ORAL_TABLET | Freq: Every day | ORAL | Status: DC
  Administered 2018-12-21 – 2018-12-25 (×5): 10 mg via ORAL
  Filled 2018-12-20 (×5): qty 1

## 2018-12-20 MED ORDER — ONDANSETRON HCL 4 MG/2ML IJ SOLN: 4.0000 mg | Freq: Four times a day (QID) | INTRAMUSCULAR | Status: DC | PRN

## 2018-12-20 MED ORDER — SODIUM CHLORIDE 0.9 % IV SOLN
1.0000 g | Freq: Once | INTRAVENOUS | Status: AC
  Administered 2018-12-20: 1 g via INTRAVENOUS
  Filled 2018-12-20: qty 10

## 2018-12-20 MED ORDER — INSULIN ASPART 100 UNIT/ML ~~LOC~~ SOLN
0.0000 [IU] | Freq: Three times a day (TID) | SUBCUTANEOUS | Status: DC
  Administered 2018-12-21 – 2018-12-22 (×4): 1 [IU] via SUBCUTANEOUS
  Administered 2018-12-22 (×2): 2 [IU] via SUBCUTANEOUS
  Administered 2018-12-23: 18:00:00 3 [IU] via SUBCUTANEOUS
  Administered 2018-12-23 (×2): 1 [IU] via SUBCUTANEOUS
  Administered 2018-12-24 (×2): 2 [IU] via SUBCUTANEOUS
  Administered 2018-12-24: 1 [IU] via SUBCUTANEOUS
  Administered 2018-12-25: 13:00:00 3 [IU] via SUBCUTANEOUS
  Administered 2018-12-25: 1 [IU] via SUBCUTANEOUS
  Filled 2018-12-20 (×12): qty 1

## 2018-12-20 MED ORDER — ACETAMINOPHEN 650 MG RE SUPP: 650.0000 mg | Freq: Four times a day (QID) | RECTAL | Status: DC | PRN

## 2018-12-20 MED ORDER — LEVOTHYROXINE SODIUM 100 MCG PO TABS
100.0000 ug | ORAL_TABLET | Freq: Every day | ORAL | Status: DC
  Administered 2018-12-21 – 2018-12-25 (×4): 100 ug via ORAL
  Filled 2018-12-20 (×5): qty 1

## 2018-12-20 NOTE — ED Notes (Signed)
Called patient.  Voice Mail left.

## 2018-12-20 NOTE — ED Provider Notes (Signed)
[facility] Emergency Department Provider Note    First MD Initiated Contact with Patient 12/20/18 1616     (approximate)  I have reviewed the triage vital signs and the nursing notes.   HISTORY  Chief Complaint Weakness; Fever; and Altered Mental Status  Level V Caveat: ams  HPI Joshua Berry is a 73 y.o. male states a past medical history as listed below presents the ER with persistent fever worsening confusion Reiger's.  Not currently on any antibiotics.  Patient had extensive evaluation in the ER in the past 24 hours.  Was otherwise stable in the ER with no signs or explanation for the patient's altered mental status or complaints.  Discharge for outpatient follow-up.  Has had worsening confusion and fever.  Denies any abdominal pain.  No shortness of breath or cough.  No headache or neck pain.  Review the blood work from yesterday does show the patient is growing group B strep from both bottles.    Past Medical History:  Diagnosis Date  . Arthritis   . Cancer (HCC)    melanoma, carcinoma  . Cirrhosis of liver ([place])   . Diabetes mellitus without complication ([place])   . Hypertension   . Spleen enlarged   . Thrombocytopenia ([place])    History reviewed. No pertinent family history. Past Surgical History:  Procedure Laterality Date  . CHOLECYSTECTOMY    . SKIN BIOPSY     There are no active problems to display for this patient.     Prior to Admission medications   Medication Sig Start Date End Date Taking? Authorizing Provider  albuterol (PROVENTIL, VENTOLIN) (5 MG/ML) 0.5% NEBU Inhale 2 puffs into the lungs once.    [provider]  atorvastatin (LIPITOR) 10 MG tablet Take 10 mg by mouth daily.    [provider]  hydrocortisone (ANUSOL-HC) 2.5 % rectal cream Apply rectally 2 times daily as needed for rectal pain or bleeding 08/24/18 08/24/19  Eula Listen, MD  insulin NPH-regular Human (NOVOLIN 70/30) (70-30) 100 UNIT/ML  injection Inject 60 Units into the skin daily with breakfast.     [provider]  ipratropium-albuterol (DUONEB) 0.5-2.5 (3) MG/3ML SOLN Take 3 mLs by nebulization every 4 (four) hours as needed.     [provider]  levothyroxine (SYNTHROID, LEVOTHROID) 100 MCG tablet Take 100 mcg by mouth daily before breakfast.    [provider]  loperamide (IMODIUM A-D) 2 MG tablet Take 1 tablet (2 mg total) by mouth 4 (four) times daily as needed for diarrhea or loose stools. 08/24/18   Eula Listen, MD  metFORMIN (GLUMETZA) 500 MG (MOD) 24 hr tablet Take 500 mg by mouth at bedtime.    [provider]  omeprazole (PRILOSEC) 20 MG capsule Take 20 mg by mouth daily.    [provider]  ondansetron (ZOFRAN ODT) 4 MG disintegrating tablet Take 1 tablet (4 mg total) by mouth every 8 (eight) hours as needed for nausea or vomiting. 08/24/18   Eula Listen, MD  oxyCODONE (OXYCONTIN) 10 mg 12 hr tablet Take 1 tablet (10 mg total) by mouth every 12 (twelve) hours. Patient not taking: Reported on 08/24/2018 07/13/18   Cuthriell, Charline Bills, PA-C  phenazopyridine (PYRIDIUM) 200 MG tablet Take 1 tablet (200 mg total) by mouth 3 (three) times daily as needed for pain. 09/08/18   Sable Feil, PA-C  predniSONE (DELTASONE) 50 MG tablet Take 1 tablet (50 mg total) by mouth daily with breakfast. Patient not taking:  Reported on 08/24/2018 07/13/18   Cuthriell, Charline Bills, PA-C    Allergies Lisinopril; Sulfamethoxazole; Camphor; Latex; and Nickel    Social History Social History   Tobacco Use  . Smoking status: Former Research scientist (life sciences)  . Smokeless tobacco: Never Used  Substance Use Topics  . Alcohol use: Not on file  . Drug use: Not on file    Review of Systems Patient denies headaches, rhinorrhea, blurry vision, numbness, shortness of breath, chest pain, edema, cough, abdominal pain, nausea, vomiting, diarrhea, dysuria, fevers, rashes or hallucinations unless otherwise  stated above in HPI. ____________________________________________   PHYSICAL EXAM:  VITAL SIGNS: Vitals:   12/20/18 1621  BP: 135/60  Pulse: 81  Resp: 18  Temp: (!) 102.7 F (39.3 C)  SpO2: 93%    Constitutional: Alert but ill appearing and oriented only to person Eyes: Conjunctivae are normal.  Head: Atraumatic. Nose: No congestion/rhinnorhea. Mouth/Throat: Mucous membranes are moist.   Neck: No stridor. Painless ROM no meningeal signs.  Cardiovascular: Normal rate, regular rhythm. Grossly normal heart sounds.  Good peripheral circulation. Respiratory: Normal respiratory effort.  No retractions. Lungs CTAB. Gastrointestinal: Soft and nontender. No distention. No abdominal bruits. No CVA tenderness. Genitourinary: normal external genitalia Musculoskeletal: No lower extremity tenderness nor edema.  No joint effusions. Neurologic:   No gross focal neurologic deficits are appreciated. No facial droop Skin:  Skin is warm, dry and intact. No rash noted.  ____________________________________________   LABS (all labs ordered are listed, but only abnormal results are displayed)  Results for orders placed or performed during the hospital encounter of 12/20/18 (from the past 24 hour(s))  Comprehensive metabolic panel     Status: Abnormal   Collection Time: 12/20/18  5:12 PM  Result Value Ref Range   Sodium 136 135 - 145 mmol/L   Potassium 3.6 3.5 - 5.1 mmol/L   Chloride 105 98 - 111 mmol/L   CO2 22 22 - 32 mmol/L   Glucose, Bld 153 (H) 70 - 99 mg/dL   BUN 16 8 - 23 mg/dL   Creatinine, Ser 1.08 0.61 - 1.24 mg/dL   Calcium 8.8 (L) 8.9 - 10.3 mg/dL   Total Protein 7.3 6.5 - 8.1 g/dL   Albumin 3.7 3.5 - 5.0 g/dL   AST 60 (H) 15 - 41 U/L   ALT 36 0 - 44 U/L   Alkaline Phosphatase 74 38 - 126 U/L   Total Bilirubin 2.0 (H) 0.3 - 1.2 mg/dL   GFR calc non Af Amer >60 >60 mL/min   GFR calc Af Amer >60 >60 mL/min   Anion gap 9 5 - 15  CBC WITH DIFFERENTIAL     Status: Abnormal    Collection Time: 12/20/18  5:12 PM  Result Value Ref Range   WBC 10.1 4.0 - 10.5 K/uL   RBC 3.46 (L) 4.22 - 5.81 MIL/uL   Hemoglobin 10.3 (L) 13.0 - 17.0 g/dL   HCT 30.6 (L) 39.0 - 52.0 %   MCV 88.4 80.0 - 100.0 fL   MCH 29.8 26.0 - 34.0 pg   MCHC 33.7 30.0 - 36.0 g/dL   RDW 17.2 (H) 11.5 - 15.5 %   Platelets 41 (L) 150 - 400 K/uL   nRBC 0.0 0.0 - 0.2 %   Neutrophils Relative % 84 %   Neutro Abs 8.5 (H) 1.7 - 7.7 K/uL   Lymphocytes Relative 7 %   Lymphs Abs 0.7 0.7 - 4.0 K/uL   Monocytes Relative 8 %   Monocytes Absolute 0.8 0.1 -  1.0 K/uL   Eosinophils Relative 0 %   Eosinophils Absolute 0.0 0.0 - 0.5 K/uL   Basophils Relative 0 %   Basophils Absolute 0.0 0.0 - 0.1 K/uL   WBC Morphology MORPHOLOGY UNREMARKABLE    RBC Morphology MORPHOLOGY UNREMARKABLE    Smear Review MORPHOLOGY UNREMARKABLE    Immature Granulocytes 1 %   Abs Immature Granulocytes 0.08 (H) 0.00 - 0.07 K/uL  Procalcitonin     Status: None   Collection Time: 12/20/18  5:12 PM  Result Value Ref Range   Procalcitonin 1.96 ng/mL  Protime-INR     Status: Abnormal   Collection Time: 12/20/18  5:12 PM  Result Value Ref Range   Prothrombin Time 16.0 (H) 11.4 - 15.2 seconds   INR 1.29    ____________________________________________ ____________________________________________  RADIOLOGY   ____________________________________________   PROCEDURES  Procedure(s) performed:  Procedures    Critical Care performed: no ____________________________________________   INITIAL IMPRESSION / ASSESSMENT AND PLAN / ED COURSE  Pertinent labs & imaging results that were available during my care of the patient were reviewed by me and considered in my medical decision making (see chart for details).   DDX: bacteremia, pna, celluliits, uti, endocarditis  MARKCUS LAZENBY is a 73 y.o. who presents to the ED with was as described above.  Altered mental status likely sent secondary to fever.  Blood culture does show  evidence of group B strep.  I do not appreciate any murmurs.  No obvious cellulitic changes.  His abdominal exam is soft and benign.  Bedside ultrasound does not show any evidence of ascitic fluid.  No tenderness to palpation on exam.  The patient will be placed on continuous pulse oximetry and telemetry for monitoring.  Laboratory evaluation will be sent to evaluate for the above complaints.     Clinical Course as of Dec 20 1906  Sun Dec 20, 2018  1904 Does have mild left shift.  Given bacteremia with group B strep will treat with Rocephin.  Patient will require admission to the hospital for further medical management evaluation.   [PR]    Clinical Course User Index [PR] Merlyn Lot, MD     As part of my medical decision making, I reviewed the following data within the [facility] notes reviewed and incorporated, Labs reviewed, notes from prior ED visits.   ____________________________________________   FINAL CLINICAL IMPRESSION(S) / ED DIAGNOSES  Final diagnoses:  Bacteremia due to group B Streptococcus  Cirrhosis of liver without ascites, unspecified hepatic cirrhosis type ([place])      NEW MEDICATIONS STARTED DURING THIS VISIT:  New Prescriptions   No medications on file     Note:  This document was prepared using Dragon voice recognition software and may include unintentional dictation errors.    Merlyn Lot, MD 12/20/18 Einar Crow

## 2018-12-20 NOTE — ED Notes (Signed)
Transport to floor room 122.AS

## 2018-12-20 NOTE — H&P (Signed)
Joshua Berry at [place] NAME: Joshua Berry    MR#:  [id number]  DATE OF BIRTH:  12-09-1946  DATE OF ADMISSION:  12/20/2018  PRIMARY CARE PHYSICIAN: Henreitta Cea, MD   REQUESTING/REFERRING PHYSICIAN: Dr. Merlyn Lot  CHIEF COMPLAINT: Fever, positive blood cultures   Chief Complaint  Patient presents with  . Weakness  . Fever  . Altered Mental Status    HISTORY OF PRESENT ILLNESS:  Joshua Berry  is a 73 y.o. male with a known history of [name], liver cirrhosis, hypertension, diabetes mellitus came to ER yesterday for fever, altered mental status, seen by Dr. Karma Greaser, had blood work which was essentially normal yesterday so sent home, called into come back because of positive blood cultures showing 3 out of 4 bottles of strep agalactiae, patient eyes any abdominal pain, nausea, vomiting, diarrhea.  His main complaint is cough, mild headache.  Has chronic thrombocytopenia.  CT abdomen, pelvis without contrast done in the emergency room was negative for any intra-abdominal process.  Patient is on room air, sats are between 91 to 95%.  Patient has been having fever since Friday came to ER yesterday . PAST MEDICAL HISTORY:   Past Medical History:  Diagnosis Date  . Arthritis   . Cancer (HCC)    melanoma, carcinoma  . Cirrhosis of liver ([place])   . Diabetes mellitus without complication ([place])   . Hypertension   . Spleen enlarged   . Thrombocytopenia ([place])     PAST SURGICAL HISTOIRY:   Past Surgical History:  Procedure Laterality Date  . CHOLECYSTECTOMY    . SKIN BIOPSY      SOCIAL HISTORY:   Social History   Tobacco Use  . Smoking status: Former Research scientist (life sciences)  . Smokeless tobacco: Never Used  Substance Use Topics  . Alcohol use: Not on file    FAMILY HISTORY:  History reviewed. No pertinent family history.  DRUG ALLERGIES:   Allergies  Allergen Reactions  . Lisinopril Hives  . Sulfamethoxazole Swelling  . Camphor Rash  and Swelling  . Latex Rash  . Nickel Rash    REVIEW OF SYSTEMS:  CONSTITUTIONAL high fever, generalized weakness. EYES: No blurred or double vision.  EARS, NOSE, AND THROAT: No tinnitus or ear pain.  RESPIRATORY: No cough, shortness of breath, wheezing or hemoptysis.  CARDIOVASCULAR: No chest pain, orthopnea, edema.  GASTROINTESTINAL: No nausea, vomiting, diarrhea or abdominal pain.  GENITOURINARY: No dysuria, hematuria.  ENDOCRINE: No polyuria, nocturia,  HEMATOLOGY: No anemia, easy bruising or bleeding SKIN: No rash or lesion. MUSCULOSKELETAL: No joint pain or arthritis.   NEUROLOGIC: No tingling, numbness, weakness.  PSYCHIATRY: No anxiety or depression.   MEDICATIONS AT HOME:   Prior to Admission medications   Medication Sig Start Date End Date Taking? Authorizing Provider  albuterol (PROVENTIL, VENTOLIN) (5 MG/ML) 0.5% NEBU Inhale 2 puffs into the lungs once.    [provider]  atorvastatin (LIPITOR) 10 MG tablet Take 10 mg by mouth daily.    [provider]  hydrocortisone (ANUSOL-HC) 2.5 % rectal cream Apply rectally 2 times daily as needed for rectal pain or bleeding 08/24/18 08/24/19  Eula Listen, MD  insulin NPH-regular Human (NOVOLIN 70/30) (70-30) 100 UNIT/ML injection Inject 60 Units into the skin daily with breakfast.     [provider]  ipratropium-albuterol (DUONEB) 0.5-2.5 (3) MG/3ML SOLN Take 3 mLs by nebulization every 4 (four) hours as needed.     [provider]  levothyroxine (SYNTHROID, [place])  100 MCG tablet Take 100 mcg by mouth daily before breakfast.    [provider]  loperamide (IMODIUM A-D) 2 MG tablet Take 1 tablet (2 mg total) by mouth 4 (four) times daily as needed for diarrhea or loose stools. 08/24/18   Eula Listen, MD  metFORMIN (GLUMETZA) 500 MG (MOD) 24 hr tablet Take 500 mg by mouth at bedtime.    [provider]  omeprazole (PRILOSEC) 20 MG capsule Take 20 mg by mouth  daily.    [provider]  ondansetron (ZOFRAN ODT) 4 MG disintegrating tablet Take 1 tablet (4 mg total) by mouth every 8 (eight) hours as needed for nausea or vomiting. 08/24/18   Eula Listen, MD  oxyCODONE (OXYCONTIN) 10 mg 12 hr tablet Take 1 tablet (10 mg total) by mouth every 12 (twelve) hours. Patient not taking: Reported on 08/24/2018 07/13/18   Cuthriell, Charline Bills, PA-C  phenazopyridine (PYRIDIUM) 200 MG tablet Take 1 tablet (200 mg total) by mouth 3 (three) times daily as needed for pain. 09/08/18   Sable Feil, PA-C  predniSONE (DELTASONE) 50 MG tablet Take 1 tablet (50 mg total) by mouth daily with breakfast. Patient not taking: Reported on 08/24/2018 07/13/18   Cuthriell, Charline Bills, PA-C      VITAL SIGNS:  Blood pressure 135/60, pulse 81, temperature (!) 102.7 F (39.3 C), temperature source Oral, resp. rate 18, SpO2 93 %.  PHYSICAL EXAMINATION:  GENERAL:  73 y.o.-year-old patient lying in the bed with no acute distress.  Well-nourished. EYES: Pupils equal, round, reactive to light and accommodation. No scleral icterus. Extraocular muscles intact.  HEENT: Head atraumatic, normocephalic. Oropharynx and nasopharynx clear.  NECK:  Supple, no jugular venous distention. No thyroid enlargement, no tenderness.  LUNGS: Normal breath sounds bilaterally, no wheezing, rales,rhonchi or crepitation. No use of accessory muscles of respiration.  CARDIOVASCULAR: S1, S2 normal.  Has ejection systolic murmur present in aortic area., rubs, or gallops.  ABDOMEN: Slightly distended.  Bowel sounds present.  EXTREMITIES: No pedal edema, cyanosis, or clubbing.  NEUROLOGIC: Cranial nerves II through XII are intact. Muscle strength 5/5 in all extremities. Sensation intact. Gait not checked.  PSYCHIATRIC: The patient is alert and oriented x 3.  SKIN: No obvious rash, lesion, or ulcer.   LABORATORY PANEL:   CBC Recent Labs  Lab 12/20/18 1712  WBC 10.1  HGB 10.3*  HCT 30.6*  PLT  41*   ------------------------------------------------------------------------------------------------------------------  Chemistries  Recent Labs  Lab 12/20/18 1712  NA 136  K 3.6  CL 105  CO2 22  GLUCOSE 153*  BUN 16  CREATININE 1.08  CALCIUM 8.8*  AST 60*  ALT 36  ALKPHOS 74  BILITOT 2.0*   ------------------------------------------------------------------------------------------------------------------  Cardiac Enzymes No results for input(s): TROPONINI in the last 168 hours. ------------------------------------------------------------------------------------------------------------------  RADIOLOGY:  Dg Chest 2 View  Result Date: 12/19/2018 CLINICAL DATA:  Fever and generalized body aches since Thursday evening, fever 101.5 degrees at home, cough, history melanoma, cirrhosis, diabetes mellitus, hypertension EXAM: CHEST - 2 VIEW COMPARISON:  08/07/2018 FINDINGS: Borderline enlargement of cardiac silhouette. Mediastinal contours and pulmonary vascularity normal. Lungs clear. No pulmonary infiltrate, pleural effusion or pneumothorax. Scattered endplate spur formation of the thoracic spine. LEFT shoulder prosthesis noted. IMPRESSION: Borderline enlargement of cardiac silhouette. No acute infiltrate. Electronically Signed   By: Lavonia Dana M.D.   On: 12/19/2018 23:14   Ct Chest Wo Contrast  Result Date: 12/20/2018 CLINICAL DATA:  Acute resp illness, > 87 years old Dyspnea, chronic, neg or  nondiagnostic xray cough, fever EXAM: CT CHEST WITHOUT CONTRAST TECHNIQUE: Multidetector CT imaging of the chest was performed following the standard protocol without IV contrast. COMPARISON:  Radiographs yesterday. FINDINGS: Cardiovascular: Mild multi chamber cardiomegaly. There are coronary artery calcifications. Thoracic aorta is normal in caliber with atherosclerosis. No periaortic stranding. No pericardial effusion. Mediastinum/Nodes: Small mediastinal nodes not enlarged by size criteria. No  bulky hilar adenopathy. No visualized thyroid nodule. Esophagus is decompressed. Lungs/Pleura: No confluent consolidation. Few small pulmonary nodules including pleural-based 6 mm right upper lobe nodule, image 34 series 3. 5 mm right lower lobe nodule image 111 series 3. Additional scattered ill-defined subpleural opacities likely atelectasis. No pulmonary edema. No pleural effusion. Trachea and mainstem bronchi are patent. Upper Abdomen: Hepatic cirrhosis and splenomegaly. Distal esophageal varices. Musculoskeletal: Mild T12 superior endplate compression fracture, unchanged from 08/07/2018 chest radiograph. Mild degenerative change in the spine. There are no acute or suspicious osseous abnormalities. IMPRESSION: 1. No acute abnormality, no evidence of pneumonia. 2. Few small pulmonary nodules, largest measuring 6 mm in the right upper lobe. Non-contrast chest CT at 3-6 months is recommended. If the nodules are stable at time of repeat CT, then future CT at 18-24 months (from today's scan) is considered optional for low-risk patients, but is recommended for high-risk patients. This recommendation follows the consensus statement: Guidelines for Management of Incidental Pulmonary Nodules Detected on CT Images: From the Fleischner Society 2017; Radiology 2017; 284:228-243. 3. Hepatic cirrhosis and splenomegaly. Distal esophageal varices. Aortic Atherosclerosis (ICD10-I70.0). Electronically Signed   By: Keith Rake M.D.   On: 12/20/2018 03:51    EKG:   Orders placed or performed during the hospital encounter of 08/07/18  . ED EKG 12-Lead  . ED EKG 12-Lead  . EKG 12-Lead  . EKG 12-Lead    IMPRESSION AND PLAN:   73 year old male patient history of hypertension, diabetes mellitus type 2, Nash cirrhosis, comes in with fever, found to have positive blood cultures.  #1. sepsis present on admission with strep agalactiae bacteremia, source unknown at this time, chest x-ray, CT abdomen unremarkable, urine  has no evidence of infection.  Patient is given 1 dose of Rocephin, patient needs diagnostic paracentesis due to history of cirrhosis need to exclude SBP as well. Will consult ID, order echocardiogram as well. #2 cirrhosis, chronic thrombocytopenia: Platelets 47, no anticoagulation. 3.  Chronic shoulder pain. #4 diabetes mellitus type 2, patient is on insulin, metformin, hold metformin, insulin due to poor p.o. intake, continue sliding scale with coverage. 5.  History of sleep apnea, start CPAP at night. Hypothyroidism: Continue Synthyroid.  All the records are reviewed and case discussed with ED provider. Management plans discussed with the patient, family and they are in agreement.  CODE STATUS: Full code  TOTAL TIME TAKING CARE OF THIS PATIENT: 55 minutes.    Epifanio Lesches M.D on 12/20/2018 at 7:51 PM  Between 7am to 6pm - Pager - [phone]  After 6pm go to [email or web address] - password [email or web address] [facility]  Office  [phone]  CC: Primary care physician; Henreitta Cea, MD  Note: This dictation was prepared with Dragon dictation along with smaller phrase technology. Any transcriptional errors that result from this process are unintentional.

## 2018-12-20 NOTE — ED Triage Notes (Signed)
Pt to ED by EMS with fever. Pt seen yesterday and tested negative for flu. Per wife pt has had increased weakness and AMS since being seen. Pt febrile at 102.7.

## 2018-12-20 NOTE — ED Notes (Signed)
Blood Cultures returned 3 out of 4 bottles positive for strep / Strep B. Dr. Kerman Passey alerted and instructed to call patient for him to return to ED for further treatment.  Patient called at [phone].  No answer. Voice Mail left.

## 2018-12-20 NOTE — ED Notes (Signed)
Patient given update regarding wait time, verbalized understanding.

## 2018-12-20 NOTE — ED Provider Notes (Signed)
[facility] Emergency Department Provider Note  ____________________________________________   First MD Initiated Contact with Patient 12/20/18 [phone]     (approximate)  I have reviewed the triage vital signs and the nursing notes.   HISTORY  Chief Complaint Fever ( )    HPI Joshua Berry is a 73 y.o. male who has medical history as listed below which notably includes cirrhosis and chronic thrombocytopenia.  He presents for evaluation of fever over the last 1 to 2 days.  He has had some generalized body aches, nasal congestion, runny nose, and a dry cough.  He denies difficulty breathing, nausea, vomiting, abdominal pain, and headache.  He has had some pain in the right side of the back of his neck, but that has been present for 3 weeks is relatively mild, and is not getting better or worse recently.  He denies any numbness or weakness in any of his extremities but he has been a little bit more confused according to the family member at his bedside but this seems to be related to the fever.  He is limited in the medications he can take due to thrombocytopenia but he has taken some Tylenol which did not seem to notably change the fever.  Nothing in particular makes his symptoms better or worse.  He is awake and alert and appropriately interactive in spite of some mild confusion and word finding difficulties although his family member describes it as confusion.  Past Medical History:  Diagnosis Date  . Arthritis   . Cancer (HCC)    melanoma, carcinoma  . Cirrhosis of liver ([place])   . Diabetes mellitus without complication ([place])   . Hypertension   . Spleen enlarged   . Thrombocytopenia ([place])     There are no active problems to display for this patient.   Past Surgical History:  Procedure Laterality Date  . CHOLECYSTECTOMY    . SKIN BIOPSY      Prior to Admission medications   Medication Sig Start Date End Date Taking? Authorizing Provider  albuterol  (PROVENTIL, VENTOLIN) (5 MG/ML) 0.5% NEBU Inhale 2 puffs into the lungs once.    [provider]  atorvastatin (LIPITOR) 10 MG tablet Take 10 mg by mouth daily.    [provider]  hydrocortisone (ANUSOL-HC) 2.5 % rectal cream Apply rectally 2 times daily as needed for rectal pain or bleeding 08/24/18 08/24/19  Eula Listen, MD  insulin NPH-regular Human (NOVOLIN 70/30) (70-30) 100 UNIT/ML injection Inject 60 Units into the skin daily with breakfast.     [provider]  ipratropium-albuterol (DUONEB) 0.5-2.5 (3) MG/3ML SOLN Take 3 mLs by nebulization every 4 (four) hours as needed.     [provider]  levothyroxine (SYNTHROID, LEVOTHROID) 100 MCG tablet Take 100 mcg by mouth daily before breakfast.    [provider]  loperamide (IMODIUM A-D) 2 MG tablet Take 1 tablet (2 mg total) by mouth 4 (four) times daily as needed for diarrhea or loose stools. 08/24/18   Eula Listen, MD  metFORMIN (GLUMETZA) 500 MG (MOD) 24 hr tablet Take 500 mg by mouth at bedtime.    [provider]  omeprazole (PRILOSEC) 20 MG capsule Take 20 mg by mouth daily.    [provider]  ondansetron (ZOFRAN ODT) 4 MG disintegrating tablet Take 1 tablet (4 mg total) by mouth every 8 (eight) hours as needed for nausea or vomiting. 08/24/18   Eula Listen, MD  oxyCODONE (OXYCONTIN) 10 mg 12 hr  tablet Take 1 tablet (10 mg total) by mouth every 12 (twelve) hours. Patient not taking: Reported on 08/24/2018 07/13/18   Cuthriell, Charline Bills, PA-C  phenazopyridine (PYRIDIUM) 200 MG tablet Take 1 tablet (200 mg total) by mouth 3 (three) times daily as needed for pain. 09/08/18   Sable Feil, PA-C  predniSONE (DELTASONE) 50 MG tablet Take 1 tablet (50 mg total) by mouth daily with breakfast. Patient not taking: Reported on 08/24/2018 07/13/18   Cuthriell, Charline Bills, PA-C    Allergies Lisinopril; Sulfamethoxazole; Camphor; Latex; and Nickel  No family  history on file.  Social History Social History   Tobacco Use  . Smoking status: Former Research scientist (life sciences)  . Smokeless tobacco: Never Used  Substance Use Topics  . Alcohol use: Not on file  . Drug use: Not on file    Review of Systems Constitutional: Fever and myalgias. Eyes: No visual changes. ENT: Nasal congestion/runny nose.  Mild sore throat. Cardiovascular: Denies chest pain. Respiratory: Mild nonproductive cough.  Denies shortness of breath. Gastrointestinal: No abdominal pain.  No nausea, no vomiting.  No diarrhea.  No constipation. Genitourinary: Negative for dysuria. Musculoskeletal: Patient has some pain in the posterior right side of the neck which is been present for 3 weeks and is unchanged.  Negative for back pain. Integumentary: Negative for rash. Neurological: Negative for headaches, focal weakness or numbness.  Some confusion.   ____________________________________________   PHYSICAL EXAM:  VITAL SIGNS: ED Triage Vitals  Enc Vitals Group     BP 12/20/18 0046 (!) 178/69     Pulse Rate 12/20/18 0046 72     Resp 12/20/18 0046 18     Temp 12/20/18 0046 (!) 102.1 F (38.9 C)     Temp Source 12/20/18 0046 Oral     SpO2 12/20/18 0046 95 %     Weight 12/19/18 2223 116.1 kg (256 lb)     Height 12/19/18 2223 1.88 m (6' 2" )     Head Circumference --      Peak Flow --      Pain Score 12/19/18 2223 7     Pain Loc --      Pain Edu? --      Excl. in [place]? --     Constitutional: Alert and oriented. Well appearing and in no acute distress. Eyes: Conjunctivae are normal.  Head: Atraumatic. Nose: No congestion/rhinnorhea. Mouth/Throat: Mucous membranes are moist.  Oropharynx non-erythematous. Neck: No stridor.  The patient has reproducible pain to the right of his cervical spine at the base of his neck.  He is able to flex and extend his head without difficulty and turn his head side to side with minimal pain in that same right side of his neck.  However he has no  meningismus. Cardiovascular: Normal rate, regular rhythm. Good peripheral circulation. Grossly normal heart sounds. Respiratory: Normal respiratory effort.  No retractions. Lungs CTAB. Gastrointestinal: Soft and nontender. No distention.  Musculoskeletal: No lower extremity tenderness nor edema. No gross deformities of extremities. Neurologic:  Normal speech and language. No gross focal neurologic deficits are appreciated.  Skin:  Skin is warm, dry and intact. No rash noted. Psychiatric: Mood and affect are normal. Speech and behavior are normal.  ____________________________________________   LABS (all labs ordered are listed, but only abnormal results are displayed)  Labs Reviewed  COMPREHENSIVE METABOLIC PANEL - Abnormal; Notable for the following components:      Result Value   Sodium 134 (*)    Glucose, Bld 155 (*)  AST 42 (*)    All other components within normal limits  CBC WITH DIFFERENTIAL/PLATELET - Abnormal; Notable for the following components:   RBC 3.61 (*)    Hemoglobin 10.5 (*)    HCT 32.4 (*)    RDW 17.1 (*)    Platelets 50 (*)    Lymphs Abs 0.5 (*)    All other components within normal limits  URINALYSIS, COMPLETE (UACMP) WITH MICROSCOPIC - Abnormal; Notable for the following components:   Color, Urine YELLOW (*)    APPearance CLEAR (*)    Hgb urine dipstick SMALL (*)    Ketones, ur 5 (*)    Protein, ur 100 (*)    All other components within normal limits  GLUCOSE, CAPILLARY - Abnormal; Notable for the following components:   Glucose-Capillary 115 (*)    All other components within normal limits  CULTURE, BLOOD (ROUTINE X 2)  CULTURE, BLOOD (ROUTINE X 2)  INFLUENZA PANEL BY PCR (TYPE A & B)  I-STAT CG4 LACTIC ACID, ED  CG4 I-STAT (LACTIC ACID)   ____________________________________________  EKG  No indication for EKG ____________________________________________  RADIOLOGY I, Hinda Kehr, personally viewed and evaluated these images (plain  radiographs) as part of my medical decision making, as well as reviewing the written report by the radiologist.  ED MD interpretation: No acute infiltrate identified on chest x-ray nor on CT of the chest.  Official radiology report(s): Dg Chest 2 View  Result Date: 12/19/2018 CLINICAL DATA:  Fever and generalized body aches since Thursday evening, fever 101.5 degrees at home, cough, history melanoma, cirrhosis, diabetes mellitus, hypertension EXAM: CHEST - 2 VIEW COMPARISON:  08/07/2018 FINDINGS: Borderline enlargement of cardiac silhouette. Mediastinal contours and pulmonary vascularity normal. Lungs clear. No pulmonary infiltrate, pleural effusion or pneumothorax. Scattered endplate spur formation of the thoracic spine. LEFT shoulder prosthesis noted. IMPRESSION: Borderline enlargement of cardiac silhouette. No acute infiltrate. Electronically Signed   By: Lavonia Dana M.D.   On: 12/19/2018 23:14   Ct Chest Wo Contrast  Result Date: 12/20/2018 CLINICAL DATA:  Acute resp illness, > 15 years old Dyspnea, chronic, neg or nondiagnostic xray cough, fever EXAM: CT CHEST WITHOUT CONTRAST TECHNIQUE: Multidetector CT imaging of the chest was performed following the standard protocol without IV contrast. COMPARISON:  Radiographs yesterday. FINDINGS: Cardiovascular: Mild multi chamber cardiomegaly. There are coronary artery calcifications. Thoracic aorta is normal in caliber with atherosclerosis. No periaortic stranding. No pericardial effusion. Mediastinum/Nodes: Small mediastinal nodes not enlarged by size criteria. No bulky hilar adenopathy. No visualized thyroid nodule. Esophagus is decompressed. Lungs/Pleura: No confluent consolidation. Few small pulmonary nodules including pleural-based 6 mm right upper lobe nodule, image 34 series 3. 5 mm right lower lobe nodule image 111 series 3. Additional scattered ill-defined subpleural opacities likely atelectasis. No pulmonary edema. No pleural effusion. Trachea and  mainstem bronchi are patent. Upper Abdomen: Hepatic cirrhosis and splenomegaly. Distal esophageal varices. Musculoskeletal: Mild T12 superior endplate compression fracture, unchanged from 08/07/2018 chest radiograph. Mild degenerative change in the spine. There are no acute or suspicious osseous abnormalities. IMPRESSION: 1. No acute abnormality, no evidence of pneumonia. 2. Few small pulmonary nodules, largest measuring 6 mm in the right upper lobe. Non-contrast chest CT at 3-6 months is recommended. If the nodules are stable at time of repeat CT, then future CT at 18-24 months (from today's scan) is considered optional for low-risk patients, but is recommended for high-risk patients. This recommendation follows the consensus statement: Guidelines for Management of Incidental Pulmonary Nodules Detected on CT Images:  From the Fleischner Society 2017; Radiology 2017; [phone]. 3. Hepatic cirrhosis and splenomegaly. Distal esophageal varices. Aortic Atherosclerosis (ICD10-I70.0). Electronically Signed   By: Keith Rake M.D.   On: 12/20/2018 03:51    ____________________________________________   PROCEDURES  Critical Care performed: No   Procedure(s) performed:   Procedures   ____________________________________________   INITIAL IMPRESSION / ASSESSMENT AND PLAN / ED COURSE  As part of my medical decision making, I reviewed the following data within the [place] History obtained from family, Nursing notes reviewed and incorporated, Labs reviewed , Old chart reviewed, Radiograph reviewed  and Notes from prior ED visits    Differential diagnosis includes, but is not limited to, viral infection including but not limited to influenza, pneumonia, urinary tract infection, less likely meningitis or encephalitis.  Bacteremia is also possible.  The patient had a broad evaluation initially and all the results are surprisingly reassuring for an elderly patient with a fever of  102.9.  He has no leukocytosis, lactic acid within normal limits, and essentially normal comprehensive metabolic panel in spite of his history of liver disease, his influenza panel is negative, and his blood cultures are pending.  His chest x-ray did not show any evidence of acute infection but I have ordered a CT scan for further evaluation given that a cough is 1 of his few symptoms other than fever.  Of note, I looked back through the medical record and saw that he had a fever in the 102 range back in August and no source was ever specifically identified.  At the time the [facility] declined to take him as a transfer and so that he would be appropriate for outpatient follow-up.  I suspect the same is the case at this time given that in spite of his fever and some very mild confusion, he is alert, oriented, ambulatory, and in no acute distress.  If a specific cause of his fever cannot be identified, he will likely be appropriate for outpatient follow-up.  He is limited in terms of antipyretics due to his thrombocytopenia and liver disease but is able to take reduced doses of Tylenol.  Of note, I did bring up the possibility of a lumbar puncture to evaluate for meningitis and encephalitis.  After we discussed the risks and benefits, however, and the fact that he has no meningismus at this time, the patient, his family number, and I all agreed that the risk of a lumbar puncture is greater than the benefit at this time although certainly if his symptoms worsen later it should be reassessed.   Clinical Course as of Dec 20 450  Sun Dec 20, 2018  0442 The patient's CT scan was unremarkable with no evidence of acute infection.  He has remained stable though febrile in the emergency department since his arrival.  He is in no distress.  I reviewed all of his information carefully but I find no admittable diagnoses at this time.  I went over this with the patient and his family member who understand and agree  with the plan for discharge and outpatient follow-up.  I explained about blood cultures pending and that they should return immediately to the nearest emergency department if they receive a phone call that 1 of the blood cultures is positive.   [CF]  [phone] I gave my usual and customary return precautions and the patient and family member understand and agree with the plan.   [CF]    Clinical Course User Index [CF]  Hinda Kehr, MD    ____________________________________________  FINAL CLINICAL IMPRESSION(S) / ED DIAGNOSES  Final diagnoses:  Fever, unspecified fever cause  Thrombocytopenia ([place])     MEDICATIONS GIVEN DURING THIS VISIT:  Medications  acetaminophen (TYLENOL) tablet 1,000 mg (1,000 mg Oral Given 12/19/18 2231)  acetaminophen (TYLENOL) 500 MG tablet (  Given 12/20/18 0434)  sodium chloride 0.9 % bolus 1,000 mL (1,000 mLs Intravenous New Bag/Given 12/20/18 0334)     ED Discharge Orders    None       Note:  This document was prepared using Dragon voice recognition software and may include unintentional dictation errors.    Hinda Kehr, MD 12/20/18 [phone]

## 2018-12-20 NOTE — Discharge Instructions (Addendum)
In spite of extensive evaluation, we were not able to identify specific cause of your fever.  It is most likely the result of a flulike illness even though you tested negative for influenza A and influenza B tonight.  We recommend you continue taking your regular medications, stay hydrated with plenty of oral fluids, and take no more than 2 g of Tylenol per day until he can follow-up with your outpatient doctor.  If you develop any new or worsening symptoms, please return immediately to the nearest emergency department.

## 2018-12-21 ENCOUNTER — Other Ambulatory Visit: Payer: Self-pay

## 2018-12-21 ENCOUNTER — Inpatient Hospital Stay
Admit: 2018-12-21 | Discharge: 2018-12-21 | Disposition: A | Discharge status: Home / Self Care | Payer: Medicare Other | Attending: Internal Medicine | Admitting: Internal Medicine

## 2018-12-21 ENCOUNTER — Inpatient Hospital Stay: Payer: Medicare Other

## 2018-12-21 DIAGNOSIS — Z794 Long term (current) use of insulin: Secondary | ICD-10-CM

## 2018-12-21 DIAGNOSIS — R234 Changes in skin texture: Secondary | ICD-10-CM

## 2018-12-21 DIAGNOSIS — K7581 Nonalcoholic steatohepatitis (NASH): Secondary | ICD-10-CM

## 2018-12-21 DIAGNOSIS — M542 Cervicalgia: Secondary | ICD-10-CM

## 2018-12-21 DIAGNOSIS — D696 Thrombocytopenia, unspecified: Secondary | ICD-10-CM

## 2018-12-21 DIAGNOSIS — Z888 Allergy status to other drugs, medicaments and biological substances status: Secondary | ICD-10-CM

## 2018-12-21 DIAGNOSIS — Z87891 Personal history of nicotine dependence: Secondary | ICD-10-CM

## 2018-12-21 DIAGNOSIS — M25511 Pain in right shoulder: Secondary | ICD-10-CM

## 2018-12-21 DIAGNOSIS — Z96612 Presence of left artificial shoulder joint: Secondary | ICD-10-CM

## 2018-12-21 DIAGNOSIS — Z85828 Personal history of other malignant neoplasm of skin: Secondary | ICD-10-CM

## 2018-12-21 DIAGNOSIS — Z91048 Other nonmedicinal substance allergy status: Secondary | ICD-10-CM

## 2018-12-21 DIAGNOSIS — B951 Streptococcus, group B, as the cause of diseases classified elsewhere: Secondary | ICD-10-CM

## 2018-12-21 DIAGNOSIS — K7460 Unspecified cirrhosis of liver: Secondary | ICD-10-CM

## 2018-12-21 DIAGNOSIS — R7881 Bacteremia: Secondary | ICD-10-CM

## 2018-12-21 DIAGNOSIS — M170 Bilateral primary osteoarthritis of knee: Secondary | ICD-10-CM

## 2018-12-21 DIAGNOSIS — E119 Type 2 diabetes mellitus without complications: Secondary | ICD-10-CM

## 2018-12-21 DIAGNOSIS — Z91040 Latex allergy status: Secondary | ICD-10-CM

## 2018-12-21 DIAGNOSIS — K0889 Other specified disorders of teeth and supporting structures: Secondary | ICD-10-CM

## 2018-12-21 LAB — CBC
HCT: 29.8 % — ABNORMAL LOW (ref 39.0–52.0)
Hemoglobin: 9.7 g/dL — ABNORMAL LOW (ref 13.0–17.0)
MCH: 29.1 pg (ref 26.0–34.0)
MCHC: 32.6 g/dL (ref 30.0–36.0)
MCV: 89.5 fL (ref 80.0–100.0)
Platelets: 45 10*3/uL — ABNORMAL LOW (ref 150–400)
RBC: 3.33 MIL/uL — ABNORMAL LOW (ref 4.22–5.81)
RDW: 17.3 % — ABNORMAL HIGH (ref 11.5–15.5)
WBC: 8 10*3/uL (ref 4.0–10.5)
nRBC: 0 % (ref 0.0–0.2)

## 2018-12-21 LAB — GLUCOSE, CAPILLARY
GLUCOSE-CAPILLARY: 166 mg/dL — AB (ref 70–99)
Glucose-Capillary: 144 mg/dL — ABNORMAL HIGH (ref 70–99)
Glucose-Capillary: 136 mg/dL — ABNORMAL HIGH (ref 70–99)
Glucose-Capillary: 143 mg/dL — ABNORMAL HIGH (ref 70–99)

## 2018-12-21 LAB — BASIC METABOLIC PANEL
Anion gap: 8 (ref 5–15)
BUN: 17 mg/dL (ref 8–23)
CO2: 25 mmol/L (ref 22–32)
Calcium: 8.4 mg/dL — ABNORMAL LOW (ref 8.9–10.3)
Chloride: 104 mmol/L (ref 98–111)
Creatinine, Ser: 1.16 mg/dL (ref 0.61–1.24)
GFR calc Af Amer: >60 mL/min (ref >60)
GFR calc non Af Amer: >60 mL/min (ref >60)
GLUCOSE: 163 mg/dL — AB (ref 70–99)
Potassium: 3.3 mmol/L — ABNORMAL LOW (ref 3.5–5.1)
Sodium: 137 mmol/L (ref 135–145)

## 2018-12-21 LAB — PATHOLOGIST SMEAR REVIEW

## 2018-12-21 MED ORDER — SODIUM CHLORIDE 0.9 % IV SOLN
2.0000 g | INTRAVENOUS | Status: DC
  Administered 2018-12-22 – 2018-12-24 (×3): 2 g via INTRAVENOUS
  Filled 2018-12-21: qty 20
  Filled 2018-12-21 (×3): qty 2

## 2018-12-21 MED ORDER — SODIUM CHLORIDE 0.9 % IV SOLN
1.0000 g | Freq: Once | INTRAVENOUS | Status: AC
  Administered 2018-12-21: 1 g via INTRAVENOUS
  Filled 2018-12-21: qty 10

## 2018-12-21 MED ORDER — POTASSIUM CHLORIDE CRYS ER 20 MEQ PO TBCR
40.0000 meq | EXTENDED_RELEASE_TABLET | Freq: Once | ORAL | Status: AC
  Administered 2018-12-21: 10:00:00 40 meq via ORAL
  Filled 2018-12-21: qty 2

## 2018-12-21 MED ORDER — SODIUM CHLORIDE 0.9 % IV SOLN
1.0000 g | Freq: Once | INTRAVENOUS | Status: DC
  Filled 2018-12-21: qty 10

## 2018-12-21 MED ORDER — GUAIFENESIN-DM 100-10 MG/5ML PO SYRP
5.0000 mL | ORAL_SOLUTION | ORAL | Status: DC | PRN
  Administered 2018-12-21 – 2018-12-25 (×8): 5 mL via ORAL
  Filled 2018-12-21 (×9): qty 5

## 2018-12-21 NOTE — Progress Notes (Signed)
[place] [facility] [place] NAME: Joshua Berry    MR#:  [id number]  DATE OF BIRTH:  01-Sep-1946  SUBJECTIVE:  Patient here with postive blood cultures + fevers  REVIEW OF SYSTEMS:    Review of Systems  Constitutional: ++ fever, chills no weight loss HENT: Negative for ear pain, nosebleeds, congestion, facial swelling, rhinorrhea, neck pain, neck stiffness and ear discharge.   Respiratory: Negative for cough, shortness of breath, wheezing  Cardiovascular: Negative for chest pain, palpitations and leg swelling.  Gastrointestinal: Negative for heartburn, abdominal pain, vomiting, diarrhea or consitpation Genitourinary: Negative for dysuria, urgency, frequency, hematuria Musculoskeletal: Negative for back pain or joint pain Neurological: Negative for dizziness, seizures, syncope, focal weakness,  numbness and headaches.  Hematological: Does not bruise/bleed easily.  Psychiatric/Behavioral: Negative for hallucinations, confusion, dysphoric mood    Tolerating Diet: yes      DRUG ALLERGIES:   Allergies  Allergen Reactions  . Lisinopril Hives  . Sulfamethoxazole Swelling  . Camphor Rash and Swelling  . Latex Rash  . Nickel Rash    VITALS:  Blood pressure (!) 120/58, pulse 69, temperature 99.3 F (37.4 C), temperature source Oral, resp. rate 20, height 6' 2"  (1.88 m), weight 118.4 kg, SpO2 95 %.  PHYSICAL EXAMINATION:  Constitutional: Appears well-developed and well-nourished. No distress. HENT: Normocephalic. Marland Kitchen Oropharynx is clear and moist.  Eyes: Conjunctivae and EOM are normal. PERRLA, no scleral icterus.  Neck: Normal ROM. Neck supple. No JVD. No tracheal deviation. CVS: RRR, S1/S2 +, no murmurs, no gallops, no carotid bruit.  Pulmonary: Effort and breath sounds normal, no stridor, rhonchi, wheezes, rales.  Abdominal: Soft. BS +,  no distension, tenderness, rebound or guarding.  Musculoskeletal: Normal range of motion. No edema and no  tenderness.  Neuro: Alert. CN 2-12 grossly intact. No focal deficits. Skin: Skin is warm and dry. No rash noted. Psychiatric: Normal mood and affect.      LABORATORY PANEL:   CBC Recent Labs  Lab 12/21/18 0430  WBC 8.0  HGB 9.7*  HCT 29.8*  PLT 45*   ------------------------------------------------------------------------------------------------------------------  Chemistries  Recent Labs  Lab 12/20/18 1712 12/21/18 0430  NA 136 137  K 3.6 3.3*  CL 105 104  CO2 22 25  GLUCOSE 153* 163*  BUN 16 17  CREATININE 1.08 1.16  CALCIUM 8.8* 8.4*  AST 60*  --   ALT 36  --   ALKPHOS 74  --   BILITOT 2.0*  --    ------------------------------------------------------------------------------------------------------------------  Cardiac Enzymes No results for input(s): TROPONINI in the last 168 hours. ------------------------------------------------------------------------------------------------------------------  RADIOLOGY:  Dg Chest 2 View  Result Date: 12/19/2018 CLINICAL DATA:  Fever and generalized body aches since Thursday evening, fever 101.5 degrees at home, cough, history melanoma, cirrhosis, diabetes mellitus, hypertension EXAM: CHEST - 2 VIEW COMPARISON:  08/07/2018 FINDINGS: Borderline enlargement of cardiac silhouette. Mediastinal contours and pulmonary vascularity normal. Lungs clear. No pulmonary infiltrate, pleural effusion or pneumothorax. Scattered endplate spur formation of the thoracic spine. LEFT shoulder prosthesis noted. IMPRESSION: Borderline enlargement of cardiac silhouette. No acute infiltrate. Electronically Signed   By: Lavonia Dana M.D.   On: 12/19/2018 23:14   Ct Chest Wo Contrast  Result Date: 12/20/2018 CLINICAL DATA:  Acute resp illness, > 65 years old Dyspnea, chronic, neg or nondiagnostic xray cough, fever EXAM: CT CHEST WITHOUT CONTRAST TECHNIQUE: Multidetector CT imaging of the chest was performed following the standard protocol without IV  contrast. COMPARISON:  Radiographs yesterday. FINDINGS: Cardiovascular: Mild  multi chamber cardiomegaly. There are coronary artery calcifications. Thoracic aorta is normal in caliber with atherosclerosis. No periaortic stranding. No pericardial effusion. Mediastinum/Nodes: Small mediastinal nodes not enlarged by size criteria. No bulky hilar adenopathy. No visualized thyroid nodule. Esophagus is decompressed. Lungs/Pleura: No confluent consolidation. Few small pulmonary nodules including pleural-based 6 mm right upper lobe nodule, image 34 series 3. 5 mm right lower lobe nodule image 111 series 3. Additional scattered ill-defined subpleural opacities likely atelectasis. No pulmonary edema. No pleural effusion. Trachea and mainstem bronchi are patent. Upper Abdomen: Hepatic cirrhosis and splenomegaly. Distal esophageal varices. Musculoskeletal: Mild T12 superior endplate compression fracture, unchanged from 08/07/2018 chest radiograph. Mild degenerative change in the spine. There are no acute or suspicious osseous abnormalities. IMPRESSION: 1. No acute abnormality, no evidence of pneumonia. 2. Few small pulmonary nodules, largest measuring 6 mm in the right upper lobe. Non-contrast chest CT at 3-6 months is recommended. If the nodules are stable at time of repeat CT, then future CT at 18-24 months (from today's scan) is considered optional for low-risk patients, but is recommended for high-risk patients. This recommendation follows the consensus statement: Guidelines for Management of Incidental Pulmonary Nodules Detected on CT Images: From the Fleischner Society 2017; Radiology 2017; [id number]:228-243. 3. Hepatic cirrhosis and splenomegaly. Distal esophageal varices. Aortic Atherosclerosis (ICD10-I70.0). Electronically Signed   By: Keith Rake M.D.   On: 12/20/2018 03:51   US Abdomen Limited  Result Date: 12/21/2018 CLINICAL DATA:  Evaluate for ascites EXAM: LIMITED ABDOMEN ULTRASOUND FOR ASCITES TECHNIQUE:  Limited ultrasound survey for ascites was performed in all four abdominal quadrants. COMPARISON:  None. FINDINGS: There is no significant free fluid in the abdomen. Paracentesis was not performed. Imaging of the liver demonstrates diffuse heterogeneous echotexture. Findings most likely reflect diffuse hepatic parenchymal disease such as cirrhosis. Infiltrating mass could not be excluded. IMPRESSION: There is no significant free fluid in the abdomen. Paracentesis was not performed. The liver is diffusely heterogeneous. Findings are most consistent with diffuse hepatic parenchymal disease. Infiltrating mass can up be excluded. Consider further imaging of the liver to better characterize. Electronically Signed   By: Marybelle Killings M.D.   On: 12/21/2018 09:21     ASSESSMENT AND PLAN:   73 year old male with history of liver cirrhosis/[place], diabetes who presented after a call from the emergency room with positive blood cultures.  1.  Sepsis: Patient presented with fever and tachypnea.  Sepsis is due to Streptococcus bacteremia. Continue Rocephin  2.  Streptococcus bacteremia: Continue Rocephin Follow-up on echocardiogram Follow-up in ID consultation Follow-up on final blood cultures with sensitivities  3.  Hypothyroidism: Continue Synthroid   4.  History of liver cirrhosis/Nash: Patient is status post abdominal ultrasound without evidence of ascites  5.  Hypokalemia: Replete .  Hyperlipidemia: Continue statin  Management plans discussed with the patient and daughter and they are in agreement.  CODE STATUS: Full  TOTAL TIME TAKING CARE OF THIS PATIENT: 30 minutes.     POSSIBLE D/C 1 to 2 days, DEPENDING ON CLINICAL CONDITION.   Holy Battenfield M.D on 12/21/2018 at 11:28 AM  Between 7am to 6pm - Pager - [phone] After 6pm go to [email or web address] - password [email or web address] [facility]  Office  [phone]  CC: Primary care physician; Henreitta Cea, MD  Note: This dictation  was prepared with Dragon dictation along with smaller phrase technology. Any transcriptional errors that result from this process are unintentional.

## 2018-12-21 NOTE — Care Management Note (Signed)
Case Management Note  Patient Details  Name: MARWAN LIPE MRN: [id number] Date of Birth: [birth year]  Subjective/Objective:    [facility], at this time [place] did not think that the patient needed to be transferred.  Glean Hess is CSW at [place] her pager is [phone]- RNCM contacted [facility] Nieves and she is aware of patient admission and the need for home health at discharge.  PCP is Lorelei Pont Fax number for PCP is [phone].   Doran Clay RN BSN [phone]                 Action/Plan:   Expected Discharge Date:                  Expected Discharge Plan:  [place]  In-House Referral:     Discharge planning Services  CM Consult  Post Acute Care Choice:  Home Health Choice offered to:  Patient  DME Arranged:    DME Agency:     HH Arranged:    [place] Agency:  [place]  Status of Service:  In process, will continue to follow  If discussed at Long Length of Stay Meetings, dates discussed:    Additional Comments:  Shelbie Hutching, RN 12/21/2018, 4:25 PM

## 2018-12-21 NOTE — Consult Note (Addendum)
NAME: Joshua Berry  DOB: 1946/06/22  MRN: [id number]  Date/Time: 12/21/2018 11:00 AM  Joshua Berry Subjective:  REASON FOR CONSULT: Group B streptococcus bacteremia ? Joshua Berry is a 73 y.o. male with a history of cirrhosis of the liver secondary to [facility], diabetes mellitus, left shoulder replacement, bilateral osteoarthritis of the knees, DJD presented to the ED 2 days ago with chills and confusion.  He was assessed in the ED was sent home after blood cultures were drawn.  He was asked to come back as the blood culture came positive for gram-positive cocci which was group B streptococcus. Patient usually follows at [facility] in [place].  In July 13, 2018 he was in the ED for right knee swelling secondary to osteoarthritis and was given a short course high-dose prednisone.  On August 07, 2018 patient presented to the ED with fever and had a temperature of 102.8.  He did not have any focal signs.  He was referred to be evaluated for his to admit him.  And patient declined admission to [facility].  He was discharged from the ED.  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  He presented to the ED back on 08/24/2018 with diarrhea and also had difficulty in passing urine and straight cath was placed which showed 350 cc of urine and so he was sent back with him Foley catheter and a leg bag.  Patient states he went to the [place] and was treated with antibiotics and later the Foley was removed.  He does not give any history of urinary issues now.  He states is at baseline he can drive, is pretty active but limited by pain in his knees and right shoulder.  He has had pain in his shoulder for some time but of late the last 3 weeks is gotten worse. On 12/18/2017 he developed some chills was confused and had subjective fever and came to the ED on 12/19/2017.  He had a temperature 102.1 in the ED.  Influenza was negative.  Urine analysis was okay.  Blood cultures were sent and he was sent home.   He was called back as cultures came positive for group B streptococcus. Patient denies any cough or shortness of breath or pain abdomen or dysuria.  His main complaint is right shoulder and neck pain worse for the past 3 weeks but present for nearly 6 months.  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 No pertinent family history. Allergies  Allergen Reactions  . Lisinopril Hives  . Sulfamethoxazole Swelling  . Camphor Rash and Swelling  . Latex Rash  . Nickel Rash   ? Current Facility-Administered Medications  Medication Dose Route Frequency Provider Last Rate Last Dose  . acetaminophen (TYLENOL) tablet 650 mg  650 mg Oral Q6H PRN Epifanio Lesches, MD   650 mg at 12/21/18 0867   Or  . acetaminophen (TYLENOL) suppository 650 mg  650 mg Rectal Q6H PRN Epifanio Lesches, MD      . atorvastatin (LIPITOR) tablet 10 mg  10 mg Oral Daily Epifanio Lesches, MD   10 mg at 12/21/18 0939  . bisacodyl (DULCOLAX) EC tablet 5 mg  5 mg Oral Daily PRN Joshua Berry,  Snehalatha, MD      . Derrill Memo ON 12/22/2018] cefTRIAXone (ROCEPHIN) 2 g in sodium chloride 0.9 % 100 mL IVPB  2 g Intravenous Q24H Sridharan, Prasanna, MD      . insulin aspart (novoLOG) injection 0-9 Units  0-9 Units Subcutaneous TID WC Epifanio Lesches, MD   1 Units at 12/21/18 0805  . ipratropium-albuterol (DUONEB) 0.5-2.5 (3) MG/3ML nebulizer solution 3 mL  3 mL Nebulization Q4H PRN Epifanio Lesches, MD      . levothyroxine (SYNTHROID, LEVOTHROID) tablet 100 mcg  100 mcg Oral QAC breakfast Epifanio Lesches, MD   100 mcg at 12/21/18 5732  . metroNIDAZOLE (FLAGYL) IVPB 500 mg  500 mg  Intravenous Q8H Epifanio Lesches, MD 100 mL/hr at 12/21/18 0944 500 mg at 12/21/18 0944  . ondansetron (ZOFRAN) tablet 4 mg  4 mg Oral Q6H PRN Epifanio Lesches, MD       Or  . ondansetron (ZOFRAN) injection 4 mg  4 mg Intravenous Q6H PRN Epifanio Lesches, MD      . traZODone (DESYREL) tablet 25 mg  25 mg Oral QHS PRN Epifanio Lesches, MD         Abtx:  Anti-infectives (From admission, onward)   Start     Dose/Rate Route Frequency Ordered Stop   12/22/18 2200  cefTRIAXone (ROCEPHIN) 2 g in sodium chloride 0.9 % 100 mL IVPB     2 g 200 mL/hr over 30 Minutes Intravenous Every 24 hours 12/21/18 0343     12/21/18 0423  cefTRIAXone (ROCEPHIN) 1 g in sodium chloride 0.9 % 100 mL IVPB     1 g 200 mL/hr over 30 Minutes Intravenous  Once 12/21/18 0423 12/21/18 0535   12/21/18 0345  cefTRIAXone (ROCEPHIN) 1 g in sodium chloride 0.9 % 100 mL IVPB  Status:  Discontinued     1 g 200 mL/hr over 30 Minutes Intravenous  Once 12/21/18 0343 12/21/18 0423   12/21/18 0000  cefTRIAXone (ROCEPHIN) 1 g in sodium chloride 0.9 % 100 mL IVPB  Status:  Discontinued     1 g 200 mL/hr over 30 Minutes Intravenous Every 24 hours 12/20/18 1950 12/21/18 0423   12/20/18 2000  metroNIDAZOLE (FLAGYL) IVPB 500 mg     500 mg 100 mL/hr over 60 Minutes Intravenous Every 8 hours 12/20/18 1950     12/20/18 1715  ampicillin (OMNIPEN) 1 g in sodium chloride 0.9 % 100 mL IVPB  Status:  Discontinued     1 g 300 mL/hr over 20 Minutes Intravenous  Once 12/20/18 1633 12/20/18 1650   12/20/18 1700  cefTRIAXone (ROCEPHIN) 1 g in sodium chloride 0.9 % 100 mL IVPB     1 g 200 mL/hr over 30 Minutes Intravenous  Once 12/20/18 1650 12/20/18 1829      REVIEW OF SYSTEMS:  Const:fever, chills, negative weight loss Eyes: negative diplopia or visual changes, negative eye pain ENT: negative coryza, negative sore throat Resp: negative cough, hemoptysis, some dyspnea Cards: negative for chest pain, palpitations, lower  extremity edema GU: negative for frequency, dysuria and hematuria GI: Negative for abdominal pain, diarrhea, bleeding, constipation Skin: negative for rash and pruritus Heme: positive  for easy bruising and bleeding MS: negative for myalgias, has arthralgias, back pain and muscle weakness Neurolo:negative for headaches, dizziness, vertigo, memory problems  Psych: negative for feelings of anxiety, depression  Endocrine: No polyuria or polydipsia Allergy/Immunology-history of allergy to sulfa, lisinopril Objective:  VITALS:  BP (!) 120/58 (BP Location: Left Arm)   Pulse 69  Temp (!) 102 F (38.9 C) (Oral)   Resp 20   Ht 6' 2"  (1.88 m)   Wt 118.4 kg   SpO2 95%   BMI 33.51 kg/m  PHYSICAL EXAM:  General: Alert, cooperative, no distress, appears stated age.  Head: Normocephalic, without obvious abnormality, atraumatic. Eyes: Conjunctivae clear, anicteric sclerae. Pupils are equal ENT Nares normal. No drainage or sinus tenderness. Lips, mucosa, and tongue normal. No Thrush, poor dentition Neck: Supple, symmetrical, no adenopathy, thyroid: non tender no carotid bruit and no JVD. Limited mobility on the right shoulder.  Left shoulder scar present Back: No CVA tenderness. Lungs: Clear to auscultation bilaterally. No Wheezing or Rhonchi. No rales. Heart: Regular rate and rhythm, no murmur, rub or gallop. Abdomen: Soft, non-tender,not distended. Bowel sounds normal. No masses Extremities: atraumatic, no cyanosis. No edema. No clubbing, dry skin over the toes on the left foot third interdigital space there is a fissure Skin: No rashes or lesions. Or bruising Lymph: Cervical, supraclavicular normal. Neurologic: Grossly non-focal Pertinent Labs Lab Results CBC    Component Value Date/Time   WBC 8.0 12/21/2018 0430   RBC 3.33 (L) 12/21/2018 0430   HGB 9.7 (L) 12/21/2018 0430   HGB 12.3 (L) 05/01/2014 1553   HCT 29.8 (L) 12/21/2018 0430   HCT 36.6 (L) 05/01/2014 1553   PLT 45 (L)  12/21/2018 0430   PLT 72 (L) 05/01/2014 1553   MCV 89.5 12/21/2018 0430   MCV 96 05/01/2014 1553   MCH 29.1 12/21/2018 0430   MCHC 32.6 12/21/2018 0430   RDW 17.3 (H) 12/21/2018 0430   RDW 15.8 (H) 05/01/2014 1553   LYMPHSABS 0.7 12/20/2018 1712   MONOABS 0.8 12/20/2018 1712   EOSABS 0.0 12/20/2018 1712   BASOSABS 0.0 12/20/2018 1712    CMP Latest Ref Rng & Units 12/21/2018 12/20/2018 12/19/2018  Glucose 70 - 99 mg/dL 163(H) 153(H) 155(H)  BUN 8 - 23 mg/dL 17 16 14   Creatinine 0.61 - 1.24 mg/dL 1.16 1.08 0.97  Sodium 135 - 145 mmol/L 137 136 134(L)  Potassium 3.5 - 5.1 mmol/L 3.3(L) 3.6 3.6  Chloride 98 - 111 mmol/L 104 105 104  CO2 22 - 32 mmol/L 25 22 23   Calcium 8.9 - 10.3 mg/dL 8.4(L) 8.8(L) 9.1  Total Protein 6.5 - 8.1 g/dL - 7.3 8.0  Total Bilirubin 0.3 - 1.2 mg/dL - 2.0(H) 1.2  Alkaline Phos 38 - 126 U/L - 74 76  AST 15 - 41 U/L - 60(H) 42(H)  ALT 0 - 44 U/L - 36 29      Microbiology: Recent Results (from the past 240 hour(s))  Blood Culture (routine x 2)     Status: Abnormal (Preliminary result)   Collection Time: 12/19/18 10:31 PM  Result Value Ref Range Status   Specimen Description BLOOD LEFT ANTECUBITAL  Final   Special Requests   Final    BOTTLES DRAWN AEROBIC AND ANAEROBIC Blood Culture results may not be optimal due to an excessive volume of blood received in culture bottles Performed at [facility], [address] [place], [place] [address]    Culture  Setup Time   Final    GRAM POSITIVE COCCI IN BOTH AEROBIC AND ANAEROBIC BOTTLES CRITICAL RESULT CALLED TO, READ BACK BY AND VERIFIED WITH: Gwynn Burly AT 1150 12/20/2018.PMF    Culture GROUP B STREP(S.AGALACTIAE)ISOLATED (A)  Final   Report Status PENDING  Incomplete  Blood Culture ID Panel (Reflexed)     Status: Abnormal   Collection Time: 12/19/18 10:31  PM  Result Value Ref Range Status   Enterococcus species NOT DETECTED NOT DETECTED Final   Listeria monocytogenes NOT DETECTED NOT DETECTED Final    Staphylococcus species NOT DETECTED NOT DETECTED Final   Staphylococcus aureus (BCID) NOT DETECTED NOT DETECTED Final   Streptococcus species DETECTED (A) NOT DETECTED Final    Comment: CRITICAL RESULT CALLED TO, READ BACK BY AND VERIFIED WITH: Gwynn Burly AT 1150 12/20/2018.PMF    Streptococcus agalactiae DETECTED (A) NOT DETECTED Final    Comment: CRITICAL RESULT CALLED TO, READ BACK BY AND VERIFIED WITH: Gwynn Burly AT 1150 12/20/2018.PMF    Streptococcus pneumoniae NOT DETECTED NOT DETECTED Final   Streptococcus pyogenes NOT DETECTED NOT DETECTED Final   Acinetobacter baumannii NOT DETECTED NOT DETECTED Final   Enterobacteriaceae species NOT DETECTED NOT DETECTED Final   Enterobacter cloacae complex NOT DETECTED NOT DETECTED Final   Escherichia coli NOT DETECTED NOT DETECTED Final   Klebsiella oxytoca NOT DETECTED NOT DETECTED Final   Klebsiella pneumoniae NOT DETECTED NOT DETECTED Final   Proteus species NOT DETECTED NOT DETECTED Final   Serratia marcescens NOT DETECTED NOT DETECTED Final   Haemophilus influenzae NOT DETECTED NOT DETECTED Final   Neisseria meningitidis NOT DETECTED NOT DETECTED Final   Pseudomonas aeruginosa NOT DETECTED NOT DETECTED Final   Candida albicans NOT DETECTED NOT DETECTED Final   Candida glabrata NOT DETECTED NOT DETECTED Final   Candida krusei NOT DETECTED NOT DETECTED Final   Candida parapsilosis NOT DETECTED NOT DETECTED Final   Candida tropicalis NOT DETECTED NOT DETECTED Final    Comment: Performed at [facility], [address] [place], [place] [address]  Blood Culture (routine x 2)     Status: Abnormal (Preliminary result)   Collection Time: 12/20/18  3:29 AM  Result Value Ref Range Status   Specimen Description BLOOD RIGHT ANTECUBITAL  Final   Special Requests   Final    BOTTLES DRAWN AEROBIC AND ANAEROBIC Blood Culture adequate volume   Culture  Setup Time   Final    GRAM POSITIVE COCCI IN BOTH AEROBIC AND ANAEROBIC  BOTTLES CRITICAL RESULT CALLED TO, READ BACK BY AND VERIFIED WITH: Gwynn Burly AT 1150 12/20/2018.PMF Performed at [facility], [address] [place], [place] [address]    Culture GROUP B STREP(S.AGALACTIAE)ISOLATED (A)  Final   Report Status PENDING  Incomplete  Blood Culture (routine x 2)     Status: None (Preliminary result)   Collection Time: 12/20/18  5:12 PM  Result Value Ref Range Status   Specimen Description BLOOD RIGHT ANTECUBITAL  Final   Special Requests   Final    BOTTLES DRAWN AEROBIC AND ANAEROBIC Blood Culture adequate volume   Culture  Setup Time   Final    GRAM POSITIVE COCCI IN BOTH AEROBIC AND ANAEROBIC BOTTLES CRITICAL RESULT CALLED TO, READ BACK BY AND VERIFIED WITH: DAVID BESANTI ON 12/21/2018 AT 0255 QSD Performed at [facility], [address], [place],  [address]    Culture GRAM POSITIVE COCCI  Final   Report Status PENDING  Incomplete  Blood Culture (routine x 2)     Status: None (Preliminary result)   Collection Time: 12/20/18  5:12 PM  Result Value Ref Range Status   Specimen Description BLOOD BLOOD RIGHT HAND  Final   Special Requests   Final    BOTTLES DRAWN AEROBIC AND ANAEROBIC Blood Culture results may not be optimal due to an excessive volume of blood received in culture bottles   Culture  Setup Time   Final    GRAM POSITIVE COCCI IN BOTH AEROBIC AND ANAEROBIC BOTTLES CRITICAL RESULT CALLED TO, READ BACK BY AND VERIFIED WITH: DAVID BESANTI ON 12/21/2018 AT 0255 QSD Performed at [facility], [address], [place], [place] [address]    Culture [facility] POSITIVE COCCI  Final   Report Status PENDING  Incomplete   IMAGING RESULTS: ? Impression/Recommendation 72 y.o. male with a history of cirrhosis of the liver secondary to [facility], diabetes mellitus, left shoulder replacement, bilateral osteoarthritis of the knees, DJD presented to the ED 2 days ago with chills and confusion.  He was assessed in the ED was sent home  after blood cultures were drawn.  He was asked to come back as the blood culture came positive for gram-positive cocci which was group B streptococcus.? ? ?Group B streptococcus bacteremia: Unclear source.  Usually an organism of GI and GU tract.  There is no obvious skin lesions. We need to rule out any deep focus.  Will need 2D echo to rule out endocarditis and may need TEE. As he complains of right shoulder and neck pain we may need to image that area to make sure there is no infection.  Continue ceftriaxone  Cirrhosis with thrombocytopenia- no evidence of SBP  Left shoulder arthroplasty.  Hardware present but no clinical evidence of infection.  Diabetes mellitus on insulin  Recent history of urinary retention needing Foley catheter and September 2019.  Recommend post void bladder scan to look for any residual.   ___________________________________________________ Discussed with patient, and his nurse   note:  This document was prepared using Dragon voice recognition software and may include unintentional dictation errors.

## 2018-12-21 NOTE — Progress Notes (Signed)
*  PRELIMINARY RESULTS* Echocardiogram 2D Echocardiogram has been performed.  Joshua Berry 12/21/2018, 3:24 PM

## 2018-12-21 NOTE — Plan of Care (Signed)

## 2018-12-21 NOTE — Care Management Note (Signed)
Case Management Note  Patient Details  Name: Joshua Berry MRN: [id number] Date of Birth: 28-May-1946  Subjective/Objective:      Patient admitted with sepsis.  Patient is from home and lives with his wife.  Daughter Marlane Hatcher is at the bedside.  Patient reports that his PCP is [place] in [place].  [facility] in [place] notified of patient's admission to the hospital.  Patient wants to ensure that his stay is covered.  The patient does not want to transfer unless he has to, he lives in [place] is a little bit of a drive for his family.  Patient has been independent at home, he has a walker and cane but only uses them when not feeling well.  Patient's wife recently broke her hip and is in a wheelchair.  Patient would like to go home with home health.  His wife gets [facility] and he would like to use them also.  RNCM will cont to follow.  [facility] contact is Vita Erm number is [phone] ext [phone].  RNCM secure emailed Helene Kelp patient information.   Doran Clay RN BSN [phone]               Action/Plan:   Expected Discharge Date:                  Expected Discharge Plan:  [place]  In-House Referral:     Discharge planning Services  CM Consult  Post Acute Care Choice:  Home Health Choice offered to:  Patient  DME Arranged:    DME Agency:     HH Arranged:    [place] Agency:  [place]  Status of Service:  In process, will continue to follow  If discussed at Long Length of Stay Meetings, dates discussed:    Additional Comments:  Shelbie Hutching, RN 12/21/2018, 3:32 PM

## 2018-12-21 NOTE — Progress Notes (Signed)
PHARMACY - PHYSICIAN COMMUNICATION CRITICAL VALUE ALERT - BLOOD CULTURE IDENTIFICATION (BCID)  Joshua Berry is an 74 y.o. male who presented to [facility] on 12/20/2018 with a chief complaint of weakness, AMS, fever  Assessment:  Tachycardic, WBC 4.8 >> 10.1, CXR cardiomegaly, LA 2.5 >> 2.9, PCT 1.96, 3/4 GPC BCID strep agalactiae  Name of physician (or Provider) Contacted: Arta Silence  Current antibiotics: Ceftriaxone/flagyl  Changes to prescribed antibiotics recommended:  Recommendations accepted by provider -- Will increase Ceftriaxone 2g IV q24h.  Patient received ceftriaxone 1g @ 0200 will give another 1g to complete 2g then start 2g daily 1/6 @ 2200.  Results for orders placed or performed during the hospital encounter of 12/20/18  Blood Culture ID Panel (Reflexed) (Collected: 12/19/2018 10:31 PM)  Result Value Ref Range   Enterococcus species NOT DETECTED NOT DETECTED   Listeria monocytogenes NOT DETECTED NOT DETECTED   Staphylococcus species NOT DETECTED NOT DETECTED   Staphylococcus aureus (BCID) NOT DETECTED NOT DETECTED   Streptococcus species DETECTED (A) NOT DETECTED   Streptococcus agalactiae DETECTED (A) NOT DETECTED   Streptococcus pneumoniae NOT DETECTED NOT DETECTED   Streptococcus pyogenes NOT DETECTED NOT DETECTED   Acinetobacter baumannii NOT DETECTED NOT DETECTED   Enterobacteriaceae species NOT DETECTED NOT DETECTED   Enterobacter cloacae complex NOT DETECTED NOT DETECTED   Escherichia coli NOT DETECTED NOT DETECTED   Klebsiella oxytoca NOT DETECTED NOT DETECTED   Klebsiella pneumoniae NOT DETECTED NOT DETECTED   Proteus species NOT DETECTED NOT DETECTED   Serratia marcescens NOT DETECTED NOT DETECTED   Haemophilus influenzae NOT DETECTED NOT DETECTED   Neisseria meningitidis NOT DETECTED NOT DETECTED   Pseudomonas aeruginosa NOT DETECTED NOT DETECTED   Candida albicans NOT DETECTED NOT DETECTED   Candida glabrata NOT DETECTED NOT DETECTED   Candida  krusei NOT DETECTED NOT DETECTED   Candida parapsilosis NOT DETECTED NOT DETECTED   Candida tropicalis NOT DETECTED NOT DETECTED   Tobie Lords, PharmD, BCPS Clinical Pharmacist 12/21/2018

## 2018-12-21 NOTE — Progress Notes (Signed)
ID Full consult note to follow Dm, NASH, cirrhosis Admitted with confusion and fever Has Group B streptococcus bacteremia No obvious port of entry- usually is a GI/GU bacteria Need to r/o deep source Poor dentition No SBP clinically Need 2 d echo/TEE  to look for any vegetation Need Post void bladder scan to look for residue ( recently had foley) Has rt shoulder, neck pain- need imaging Continue Ceftriaxone

## 2018-12-21 NOTE — Progress Notes (Signed)
Notified on call Dr. Kelton Pillar of patient's lactic 2.9 from 2.5, notified of temperature, no new orders at this time. Oral temp at 99.0.

## 2018-12-22 LAB — CULTURE, BLOOD (ROUTINE X 2): Special Requests: ADEQUATE

## 2018-12-22 LAB — ECHOCARDIOGRAM COMPLETE
Height: 74 in
Weight: 4176.39 oz

## 2018-12-22 LAB — C DIFFICILE QUICK SCREEN W PCR REFLEX
C Diff antigen: NEGATIVE
C Diff interpretation: NOT DETECTED
C Diff toxin: NEGATIVE

## 2018-12-22 LAB — GLUCOSE, CAPILLARY
Glucose-Capillary: 142 mg/dL — ABNORMAL HIGH (ref 70–99)
Glucose-Capillary: 169 mg/dL — ABNORMAL HIGH (ref 70–99)
Glucose-Capillary: 179 mg/dL — ABNORMAL HIGH (ref 70–99)
Glucose-Capillary: 181 mg/dL — ABNORMAL HIGH (ref 70–99)

## 2018-12-22 MED ORDER — TRAMADOL HCL 50 MG PO TABS
50.0000 mg | ORAL_TABLET | Freq: Two times a day (BID) | ORAL | Status: DC | PRN
  Administered 2018-12-22 – 2018-12-25 (×3): 50 mg via ORAL
  Filled 2018-12-22 (×3): qty 1

## 2018-12-22 MED ORDER — TRAMADOL HCL 50 MG PO TABS: 50.0000 mg | ORAL_TABLET | Freq: Two times a day (BID) | ORAL | Status: DC

## 2018-12-22 NOTE — Care Management Note (Signed)
Case Management Note  Patient Details  Name: HOLBERT CAPLES MRN: [id number] Date of Birth: 08-02-1946  Subjective/Objective:                 ID has recommended echo and TEE to look for any vegetation as source for bacteremia.  At present there is no indication of need for long term IV antibiotic therapy   Action/Plan:   Expected Discharge Date:                  Expected Discharge Plan:  [place]  In-House Referral:     Discharge planning Services  CM Consult  Post Acute Care Choice:  Home Health Choice offered to:  Patient  DME Arranged:    DME Agency:     HH Arranged:    [place] Agency:  [place]  Status of Service:  In process, will continue to follow  If discussed at Long Length of Stay Meetings, dates discussed:    Additional Comments:  Katrina Stack, RN 12/22/2018, 10:44 AM

## 2018-12-22 NOTE — Progress Notes (Signed)
[facility] [place] NAME: Joshua Berry    MR#:  [id number]  DATE OF BIRTH:  01-16-1946  SUBJECTIVE:  No fever overnight Neck pain  REVIEW OF SYSTEMS:    Review of Systems  Constitutional: ++ fever, chills no weight loss HENT: Negative for ear pain, nosebleeds, congestion, facial swelling, rhinorrhea, neck pain, neck stiffness and ear discharge.   Respiratory: Negative for cough, shortness of breath, wheezing  Cardiovascular: Negative for chest pain, palpitations and leg swelling.  Gastrointestinal: Negative for heartburn, abdominal pain, vomiting, diarrhea or consitpation Genitourinary: Negative for dysuria, urgency, frequency, hematuria Musculoskeletal: Negative for back pain or joint pain +neck pain Neurological: Negative for dizziness, seizures, syncope, focal weakness,  numbness and headaches.  Hematological: Does not bruise/bleed easily.  Psychiatric/Behavioral: Negative for hallucinations, confusion, dysphoric mood    Tolerating Diet: yes      DRUG ALLERGIES:   Allergies  Allergen Reactions  . Lisinopril Hives  . Sulfamethoxazole Swelling  . Camphor Rash and Swelling  . Latex Rash  . Nickel Rash    VITALS:  Blood pressure 122/85, pulse 64, temperature 98.7 F (37.1 C), temperature source Oral, resp. rate 18, height 6' 2"  (1.88 m), weight 118.4 kg, SpO2 96 %.  PHYSICAL EXAMINATION:  Constitutional: Appears well-developed and well-nourished. No distress. HENT: Normocephalic. Marland Kitchen Oropharynx is clear and moist.  Eyes: Conjunctivae and EOM are normal. PERRLA, no scleral icterus.  Neck: Normal ROM. Neck supple. No JVD. No tracheal deviation. CVS: RRR, S1/S2 +, no murmurs, no gallops, no carotid bruit.  Pulmonary: Effort and breath sounds normal, no stridor, rhonchi, wheezes, rales.  Abdominal: Soft. BS +,  no distension, tenderness, rebound or guarding.  Musculoskeletal: Normal range of motion. No edema and no tenderness.   Neuro: Alert. CN 2-12 grossly intact. No focal deficits. Skin: Skin is warm and dry. No rash noted. Psychiatric: Normal mood and affect.      LABORATORY PANEL:   CBC Recent Labs  Lab 12/21/18 0430  WBC 8.0  HGB 9.7*  HCT 29.8*  PLT 45*   ------------------------------------------------------------------------------------------------------------------  Chemistries  Recent Labs  Lab 12/20/18 1712 12/21/18 0430  NA 136 137  K 3.6 3.3*  CL 105 104  CO2 22 25  GLUCOSE 153* 163*  BUN 16 17  CREATININE 1.08 1.16  CALCIUM 8.8* 8.4*  AST 60*  --   ALT 36  --   ALKPHOS 74  --   BILITOT 2.0*  --    ------------------------------------------------------------------------------------------------------------------  Cardiac Enzymes No results for input(s): TROPONINI in the last 168 hours. ------------------------------------------------------------------------------------------------------------------  RADIOLOGY:  US Abdomen Limited  Result Date: 12/21/2018 CLINICAL DATA:  Evaluate for ascites EXAM: LIMITED ABDOMEN ULTRASOUND FOR ASCITES TECHNIQUE: Limited ultrasound survey for ascites was performed in all four abdominal quadrants. COMPARISON:  None. FINDINGS: There is no significant free fluid in the abdomen. Paracentesis was not performed. Imaging of the liver demonstrates diffuse heterogeneous echotexture. Findings most likely reflect diffuse hepatic parenchymal disease such as cirrhosis. Infiltrating mass could not be excluded. IMPRESSION: There is no significant free fluid in the abdomen. Paracentesis was not performed. The liver is diffusely heterogeneous. Findings are most consistent with diffuse hepatic parenchymal disease. Infiltrating mass can up be excluded. Consider further imaging of the liver to better characterize. Electronically Signed   By: Marybelle Killings M.D.   On: 12/21/2018 09:21     ASSESSMENT AND PLAN:   73 year old male with history of liver cirrhosis/Nash,  diabetes who presented after a call  from the emergency room with positive blood cultures.  1.  Sepsis: Patient presented with fever and tachypnea.  Sepsis is due to group B Streptococcus bacteremia. Continue Rocephin F/u repeat blood cultures.  2.  Group B Streptococcus bacteremia: Continue Rocephin Echocardiogram showed no vegetation.  Patient may need TEE. He may also need imaging as per ID due to his neck pain. ID to see patient this afternoon and she may order further testing.  3.  Hypothyroidism: Continue Synthroid   4.  History of liver cirrhosis/Nash: Patient is status post abdominal ultrasound without evidence of ascites  5.  Hypokalemia: Replete PRN 6. Hyperlipidemia: Continue statin  Management plans discussed with the patient and daughter and they are in agreement.  CODE STATUS: Full  TOTAL TIME TAKING CARE OF THIS PATIENT: 24 minutes.     POSSIBLE D/C 1 to 2 days, DEPENDING ON CLINICAL CONDITION.   Tehran Rabenold M.D on 12/22/2018 at 1:24 PM  Between 7am to 6pm - Pager - [phone] After 6pm go to [email or web address] - password [email or web address] [facility]  Office  [phone]  CC: Primary care physician; Lorelei Pont, MD  Note: This dictation was prepared with Dragon dictation along with smaller phrase technology. Any transcriptional errors that result from this process are unintentional.

## 2018-12-22 NOTE — Progress Notes (Signed)
ID Group B strep bacteremia with no source 2 d echo -No obvious vegetation Needs TEE Also has rt shoulder and neck pain Will need imaging As he has Left shoulder replacement- will need to check with radiologist regarding feasibility of MRI VS CT . Discussed with his nurse

## 2018-12-23 ENCOUNTER — Inpatient Hospital Stay
Admit: 2018-12-23 | Discharge: 2018-12-23 | Disposition: A | Discharge status: Home / Self Care | Payer: Medicare Other | Attending: Cardiology | Admitting: Cardiology

## 2018-12-23 ENCOUNTER — Encounter
Admission: EM | Disposition: A | Discharge status: Home Health | Payer: Self-pay | Source: Home / Self Care | Attending: Internal Medicine

## 2018-12-23 ENCOUNTER — Inpatient Hospital Stay: Payer: Medicare Other

## 2018-12-23 HISTORY — PX: TEE WITHOUT CARDIOVERSION: SHX5443

## 2018-12-23 LAB — BASIC METABOLIC PANEL
Anion gap: 7 (ref 5–15)
BUN: 14 mg/dL (ref 8–23)
CHLORIDE: 104 mmol/L (ref 98–111)
CO2: 23 mmol/L (ref 22–32)
Calcium: 8.2 mg/dL — ABNORMAL LOW (ref 8.9–10.3)
Creatinine, Ser: 0.83 mg/dL (ref 0.61–1.24)
GFR calc Af Amer: >60 mL/min (ref >60)
GFR calc non Af Amer: >60 mL/min (ref >60)
Glucose, Bld: 148 mg/dL — ABNORMAL HIGH (ref 70–99)
Potassium: 3.4 mmol/L — ABNORMAL LOW (ref 3.5–5.1)
SODIUM: 134 mmol/L — AB (ref 135–145)

## 2018-12-23 LAB — CULTURE, BLOOD (ROUTINE X 2): SPECIAL REQUESTS: ADEQUATE

## 2018-12-23 LAB — GLUCOSE, CAPILLARY
GLUCOSE-CAPILLARY: 122 mg/dL — AB (ref 70–99)
GLUCOSE-CAPILLARY: 217 mg/dL — AB (ref 70–99)
Glucose-Capillary: 131 mg/dL — ABNORMAL HIGH (ref 70–99)
Glucose-Capillary: 126 mg/dL — ABNORMAL HIGH (ref 70–99)
Glucose-Capillary: 133 mg/dL — ABNORMAL HIGH (ref 70–99)

## 2018-12-23 SURGERY — ECHOCARDIOGRAM, TRANSESOPHAGEAL
Anesthesia: Moderate Sedation

## 2018-12-23 MED ORDER — BUTAMBEN-TETRACAINE-BENZOCAINE 2-2-14 % EX AERO
INHALATION_SPRAY | CUTANEOUS | Status: AC
  Administered 2018-12-23: 3
  Filled 2018-12-23: qty 5

## 2018-12-23 MED ORDER — SODIUM CHLORIDE FLUSH 0.9 % IV SOLN
INTRAVENOUS | Status: AC
  Administered 2018-12-23: 11:00:00
  Filled 2018-12-23: qty 10

## 2018-12-23 MED ORDER — GADOBUTROL 1 MMOL/ML IV SOLN
10.0000 mL | Freq: Once | INTRAVENOUS | Status: AC | PRN
  Administered 2018-12-23: 22:00:00 10 mL via INTRAVENOUS

## 2018-12-23 MED ORDER — LORAZEPAM 2 MG/ML IJ SOLN
1.0000 mg | Freq: Once | INTRAMUSCULAR | Status: AC
  Administered 2018-12-23: 1 mg via INTRAVENOUS
  Filled 2018-12-23: qty 1

## 2018-12-23 MED ORDER — SODIUM CHLORIDE 0.9 % IV SOLN: INTRAVENOUS | Status: DC

## 2018-12-23 MED ORDER — FENTANYL CITRATE (PF) 100 MCG/2ML IJ SOLN
INTRAMUSCULAR | Status: AC | PRN
  Administered 2018-12-23: 50 ug via INTRAVENOUS

## 2018-12-23 MED ORDER — POLYVINYL ALCOHOL 1.4 % OP SOLN
1.0000 [drp] | OPHTHALMIC | Status: DC | PRN
  Filled 2018-12-23: qty 15

## 2018-12-23 MED ORDER — MIDAZOLAM HCL 5 MG/5ML IJ SOLN
INTRAMUSCULAR | Status: AC | PRN
  Administered 2018-12-23: 2 mg via INTRAVENOUS

## 2018-12-23 MED ORDER — FENTANYL CITRATE (PF) 100 MCG/2ML IJ SOLN
INTRAMUSCULAR | Status: AC
  Administered 2018-12-23: 10:00:00
  Filled 2018-12-23: qty 2

## 2018-12-23 MED ORDER — POTASSIUM CHLORIDE CRYS ER 20 MEQ PO TBCR
40.0000 meq | EXTENDED_RELEASE_TABLET | Freq: Once | ORAL | Status: AC
  Administered 2018-12-23: 20 meq via ORAL
  Filled 2018-12-23: qty 1
  Filled 2018-12-23: qty 2

## 2018-12-23 MED ORDER — MIDAZOLAM HCL 5 MG/5ML IJ SOLN
INTRAMUSCULAR | Status: AC
  Administered 2018-12-23: 14:00:00
  Filled 2018-12-23: qty 5

## 2018-12-23 MED ORDER — LIDOCAINE VISCOUS HCL 2 % MT SOLN
OROMUCOSAL | Status: AC
  Administered 2018-12-23: 15 mL
  Filled 2018-12-23: qty 15

## 2018-12-23 NOTE — Progress Notes (Signed)
*  PRELIMINARY RESULTS* Echocardiogram Echocardiogram Transesophageal has been performed.  Sherrie Sport 12/23/2018, 12:06 PM

## 2018-12-23 NOTE — Progress Notes (Addendum)
[facility] [place] NAME: Joshua Berry    MR#:  [id number]  DATE OF BIRTH:  10-18-1946  SUBJECTIVE:  Neck pain has improved.  No fevers overnight.  REVIEW OF SYSTEMS:    Review of Systems  Constitutional: no fever, chills no weight loss HENT: Negative for ear pain, nosebleeds, congestion, facial swelling, rhinorrhea, neck pain, neck stiffness and ear discharge.   Respiratory: Negative for cough, shortness of breath, wheezing  Cardiovascular: Negative for chest pain, palpitations and leg swelling.  Gastrointestinal: Negative for heartburn, abdominal pain, vomiting, diarrhea or consitpation Genitourinary: Negative for dysuria, urgency, frequency, hematuria Musculoskeletal: Negative for back pain or joint pain  Neurological: Negative for dizziness, seizures, syncope, focal weakness,  numbness and headaches.  Hematological: Does not bruise/bleed easily.  Psychiatric/Behavioral: Negative for hallucinations, confusion, dysphoric mood    Tolerating Diet: yes      DRUG ALLERGIES:   Allergies  Allergen Reactions  . Lisinopril Hives  . Sulfamethoxazole Swelling  . Camphor Rash and Swelling  . Latex Rash  . Nickel Rash    VITALS:  Blood pressure (!) 148/65, pulse 61, temperature 99.3 F (37.4 C), temperature source Oral, resp. rate 12, height 6' 2"  (1.88 m), weight 116.5 kg, SpO2 95 %.  PHYSICAL EXAMINATION:  Constitutional: Appears well-developed and well-nourished. No distress. HENT: Normocephalic. Marland Kitchen Oropharynx is clear and moist.  Eyes: Conjunctivae and EOM are normal. PERRLA, no scleral icterus.  Neck: Normal ROM. Neck supple. No JVD. No tracheal deviation. CVS: RRR, S1/S2 +, no murmurs, no gallops, no carotid bruit.  Pulmonary: Effort and breath sounds normal, no stridor, rhonchi, wheezes, rales.  Abdominal: Soft. BS +,  no distension, tenderness, rebound or guarding.  Musculoskeletal: Normal range of motion. No edema and no  tenderness.  Neuro: Alert. CN 2-12 grossly intact. No focal deficits. Skin: Skin is warm and dry. No rash noted. Psychiatric: Normal mood and affect.      LABORATORY PANEL:   CBC Recent Labs  Lab 12/21/18 0430  WBC 8.0  HGB 9.7*  HCT 29.8*  PLT 45*   ------------------------------------------------------------------------------------------------------------------  Chemistries  Recent Labs  Lab 12/20/18 1712  12/23/18 0609  NA 136   < > 134*  K 3.6   < > 3.4*  CL 105   < > 104  CO2 22   < > 23  GLUCOSE 153*   < > 148*  BUN 16   < > 14  CREATININE 1.08   < > 0.83  CALCIUM 8.8*   < > 8.2*  AST 60*  --   --   ALT 36  --   --   ALKPHOS 74  --   --   BILITOT 2.0*  --   --    < > = values in this interval not displayed.   ------------------------------------------------------------------------------------------------------------------  Cardiac Enzymes No results for input(s): TROPONINI in the last 168 hours. ------------------------------------------------------------------------------------------------------------------  RADIOLOGY:  No results found.   ASSESSMENT AND PLAN:   73 year old male with history of liver cirrhosis/Nash, diabetes who presented after a call from the emergency room with positive blood cultures.  1.  Sepsis: Patient presented with fever and tachypnea.  Sepsis is due to group B Streptococcus bacteremia. Continue Rocephin Repeat blood cultures are negative.    2.  Group B Streptococcus bacteremia: Continue Rocephin Echocardiogram showed no vegetation.   TEE this morning.  Discussed with Dr. Ubaldo Glassing MRI cervical and shoulder ordered by ID   3.  Hypothyroidism: Continue  Synthroid   4.  History of liver cirrhosis/Nash: Patient is status post abdominal ultrasound without evidence of ascites  5.  Hypokalemia: Replete PRN 6. Hyperlipidemia: Continue statin  7.  Hypokalemia: Repleted and recheck in a.m.  Management plans discussed with the  patient and daughter and they are in agreement.  CODE STATUS: Full  TOTAL TIME TAKING CARE OF THIS PATIENT: 24 minutes.     POSSIBLE D/C 1 to 2 days, DEPENDING ON CLINICAL CONDITION.   Matt Delpizzo M.D on 12/23/2018 at 12:08 PM  Between 7am to 6pm - Pager - [phone] After 6pm go to [email or web address] - password [email or web address] [facility]  Office  [phone]  CC: Primary care physician; Lorelei Pont, MD  Note: This dictation was prepared with Dragon dictation along with smaller phrase technology. Any transcriptional errors that result from this process are unintentional.

## 2018-12-23 NOTE — Care Management Important Message (Signed)
Important Message  Patient Details  Name: OSTEN JANEK MRN: [id number] Date of Birth: 1946-09-23   Medicare Important Message Given:  Yes    Juliann Pulse A Kimberla Driskill 12/23/2018, 11:09 AM

## 2018-12-23 NOTE — Procedures (Signed)
   TRANSESOPHAGEAL ECHOCARDIOGRAM   NAME:  Joshua Berry   MRN: [id number] DOB:  [birth year]   ADMIT DATE: 12/20/2018  INDICATIONS:   PROCEDURE:   Informed consent was obtained prior to the procedure. The risks, benefits and alternatives for the procedure were discussed and the patient comprehended these risks.  Risks include, but are not limited to, cough, sore throat, vomiting, nausea, somnolence, esophageal and stomach trauma or perforation, bleeding, low blood pressure, aspiration, pneumonia, infection, trauma to the teeth and death.    After a procedural time-out, the patient was given 2 mg versed and 50 mcg fentanyl to achieve moderate sedation for 10 min.  The oropharynx was anesthetized 3 cc of topical 1% viscous lidocaine.  The transesophageal probe was inserted in the esophagus and stomach without difficulty and multiple views were obtained. I was present for the entire procedure.    COMPLICATIONS:    There were no immediate complications.  FINDINGS:  LEFT VENTRICLE: EF = 55%. No regional wall motion abnormalities.  RIGHT VENTRICLE: Normal size and function.   LEFT ATRIUM: Normal size. No thrombus   LEFT ATRIAL APPENDAGE: No thrombus.   RIGHT ATRIUM: normal  AORTIC VALVE:  Trileaflet. No vegetations. No thrombus. Trivial ai  MITRAL VALVE:    Normal. Mild mr, no vegeations  TRICUSPID VALVE: Normal. Moderate tr. No vegetaions  PULMONIC VALVE: Grossly normal.  INTERATRIAL SEPTUM: No PFO or ASD. Agitated saline contrast was used.   PERICARDIUM: No effusion  DESCENDING AORTA:  normal CONCLUSION:  No vegetations noted. Mild ai, mr, moderate tr.

## 2018-12-23 NOTE — Progress Notes (Signed)
   Date of Admission:  12/20/2018    ID: Joshua Berry is a 73 y.o. male Active Problems:   Sepsis (Opp) Group B streptococcus bacteremia Rt shoulder/neck pain Cirrhosis   Subjective: Feeling much better No fever Pain shoulder slightly better Had TEE today  Medications:  . atorvastatin  10 mg Oral Daily  . insulin aspart  0-9 Units Subcutaneous TID WC  . levothyroxine  100 mcg Oral QAC breakfast    Objective: Vital signs in last 24 hours: Temp:  [98 F (36.7 C)-99.3 F (37.4 C)] 98 F (36.7 C) (01/08 1312) Pulse Rate:  [58-75] 61 (01/08 1312) Resp:  [12-21] 20 (01/08 1312) BP: (124-156)/(61-76) 124/67 (01/08 1312) SpO2:  [91 %-97 %] 94 % (01/08 1312) Weight:  [116.5 kg] 116.5 kg (01/08 0500)  PHYSICAL EXAM:  General: Alert, cooperative, no distress, appears stated age.  Head: Normocephalic, without obvious abnormality, atraumatic. Eyes: Conjunctivae clear, anicteric sclerae. Pupils are equal ENT Nares normal. No drainage or sinus tenderness. Lips, mucosa, and tongue normal. No Thrush Neck: Supple, symmetrical, no adenopathy, thyroid: non tender no carotid bruit and no JVD. Back: No CVA tenderness. Lungs: Clear to auscultation bilaterally. No Wheezing or Rhonchi. No rales. Heart: Regular rate and rhythm, no murmur, rub or gallop. Abdomen: Soft, non-tender,not distended. Bowel sounds normal. No masses Extremities: rt shoulder painful and restricted movt, neck movt painful Skin: No rashes or lesions. Or bruising Lymph: Cervical, supraclavicular normal. Neurologic: Grossly non-focal  Lab Results Recent Labs    12/21/18 0430 12/23/18 0609  WBC 8.0  --   HGB 9.7*  --   HCT 29.8*  --   NA 137 134*  K 3.3* 3.4*  CL 104 104  CO2 25 23  BUN 17 14  CREATININE 1.16 0.83   Microbiology: BC- Group B strep-1/5 1/7 NG Studies/Results: No results found.   Assessment/Plan: 73 y.o. male with a history of cirrhosis of the liver secondary to [facility], diabetes mellitus,  left shoulder replacement, bilateral osteoarthritis of the knees, DJD presented to the ED 2 days ago with chills and confusion.  He was assessed in the ED was sent home after blood cultures were drawn.  He came back within 6 hours as the blood culture came positive for gram-positive cocci which was group B streptococcus.? ? ?Group B streptococcus bacteremia: Unclear source.  Usually an organism of GI and GU tract.  There is no obvious skin lesions. We need to rule out any deep focus.  TEE neg for endocarditis As he complains of right shoulder and neck pain ordered MRI of the shoulder and neck after talking to radiologist Dr.Patel. continue ceftriaxone. 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  Hardware present but no clinical evidence of infection.  Diabetes mellitus on insulin  Recent history of urinary retention needing Foley catheter and September 2019.   post void bladder scan has no residue  Discussed with patient and his daughter and his nurse

## 2018-12-24 ENCOUNTER — Encounter: Payer: Self-pay | Admitting: Cardiology

## 2018-12-24 ENCOUNTER — Inpatient Hospital Stay: Payer: Self-pay

## 2018-12-24 DIAGNOSIS — M609 Myositis, unspecified: Secondary | ICD-10-CM

## 2018-12-24 LAB — BASIC METABOLIC PANEL
Anion gap: 8 (ref 5–15)
BUN: 12 mg/dL (ref 8–23)
CO2: 23 mmol/L (ref 22–32)
CREATININE: 0.81 mg/dL (ref 0.61–1.24)
Calcium: 8.4 mg/dL — ABNORMAL LOW (ref 8.9–10.3)
Chloride: 104 mmol/L (ref 98–111)
GFR calc Af Amer: >60 mL/min (ref >60)
GFR calc non Af Amer: >60 mL/min (ref >60)
Glucose, Bld: 138 mg/dL — ABNORMAL HIGH (ref 70–99)
Potassium: 3.5 mmol/L (ref 3.5–5.1)
Sodium: 135 mmol/L (ref 135–145)

## 2018-12-24 LAB — GLUCOSE, CAPILLARY
GLUCOSE-CAPILLARY: 178 mg/dL — AB (ref 70–99)
GLUCOSE-CAPILLARY: 182 mg/dL — AB (ref 70–99)
Glucose-Capillary: 132 mg/dL — ABNORMAL HIGH (ref 70–99)
Glucose-Capillary: 162 mg/dL — ABNORMAL HIGH (ref 70–99)

## 2018-12-24 LAB — SEDIMENTATION RATE: Sed Rate: 33 mm/hr — ABNORMAL HIGH (ref 0–20)

## 2018-12-24 LAB — C-REACTIVE PROTEIN: CRP: 9.6 mg/dL — ABNORMAL HIGH (ref <1.0)

## 2018-12-24 MED ORDER — SODIUM CHLORIDE 0.9 % IV SOLN
INTRAVENOUS | Status: DC | PRN
  Administered 2018-12-24: 21:00:00 250 mL via INTRAVENOUS

## 2018-12-24 NOTE — Progress Notes (Signed)
Date of Admission:  12/20/2018      ID: Joshua Berry is a 73 y.o. male  Active Problems:   Sepsis ([place]) GBS bacteremia Rt shoulder , rt neck pain   Subjective: Had an episode of severe abdominal cramping, no diarrhea or vomiting Says head hurts in the top No fever Rt shoulder and neck pain better  Medications:  . atorvastatin  10 mg Oral Daily  . insulin aspart  0-9 Units Subcutaneous TID WC  . levothyroxine  100 mcg Oral QAC breakfast    Objective: Vital signs in last 24 hours: Temp:  [98.7 F (37.1 C)-99.8 F (37.7 C)] 98.7 F (37.1 C) (01/09 0800) Pulse Rate:  [65-70] 70 (01/09 0800) Resp:  [18] 18 (01/09 0432) BP: (140-146)/(68-73) 140/68 (01/09 0800) SpO2:  [94 %] 94 % (01/09 0800) Weight:  [122.7 kg] 122.7 kg (01/09 0449)  PHYSICAL EXAM:  General: Alert, cooperative, no distress, appears stated age.  Head: Normocephalic, without obvious abnormality, atraumatic. Eyes: Conjunctivae clear, anicteric sclerae. Pupils are equal ENT Nares normal. No drainage or sinus tenderness. Lips, mucosa, and tongue normal. No Thrush Neck: Supple, symmetrical, no adenopathy, thyroid: non tender no carotid bruit and no JVD. Back: No CVA tenderness. Lungs: Clear to auscultation bilaterally. No Wheezing or Rhonchi. No rales. Heart: Regular rate and rhythm, no murmur, rub or gallop. Abdomen: Soft, non-tender,not distended. Bowel sounds normal. No masses Extremities: rt shoulder restricted movt, no swelling or erythema Skin: No rashes or lesions. Or bruising Lymph: Cervical, supraclavicular normal. Neurologic: Grossly non-focal  Lab Results Recent Labs    12/23/18 0609 12/24/18 0555  NA 134* 135  K 3.4* 3.5  CL 104 104  CO2 23 23  BUN 14 12  CREATININE 0.83 0.81   Liver Panel No results for input(s): PROT, ALBUMIN, AST, ALT, ALKPHOS, BILITOT, BILIDIR, IBILI in the last 72 hours. Sedimentation Rate Recent Labs    12/24/18 0555  ESRSEDRATE 33*   C-Reactive  Protein Recent Labs    12/24/18 0555  CRP 9.6*    Microbiology:  Studies/Results: Mr Cervical Spine W Wo Contrast  Result Date: 12/23/2018 CLINICAL DATA:  73 y/o M; worsening right-sided neck pain for 3 weeks. Recent onset fever. Bacteremia. Rule out infection. EXAM: MRI CERVICAL SPINE WITHOUT AND WITH CONTRAST TECHNIQUE: Multiplanar and multiecho pulse sequences of the cervical spine, to include the craniocervical junction and cervicothoracic junction, were obtained without and with intravenous contrast. CONTRAST:  10 cc Gadavist COMPARISON:  None. FINDINGS: Alignment: Straightening of cervical lordosis without listhesis. Vertebrae: No acute fracture or findings of discitis. No significant bone marrow edema. Cord: No abnormal cord signal or enhancement. Mild smooth epidural thickening and enhancement of the predominantly posterior epidural space extending from C2 down below the field of view (series 16, image 15 and series 7, image 15). There is mild enhancement and thickening of the right sided epidural space at the C4-5 level (series 17, image 12). Additionally, there is edema and enhancement within right greater than left paraspinal muscles. No discrete abscess identified. Posterior Fossa, vertebral arteries, paraspinal tissues: As above. Disc levels: C2-3: Left-sided facet hypertrophy with mild left foraminal stenosis. No right foraminal or canal stenosis. C3-4: Left-sided uncovertebral hypertrophy, left foraminal disc protrusion, and left-greater-than-right facet hypertrophy. Mild right and moderate to severe left foraminal stenosis. Mild spinal canal stenosis. C4-5: Right greater than left uncovertebral and facet hypertrophy with disc bulge eccentric to the right foraminal zone. Severe right and mild left foraminal stenosis. Mild spinal canal stenosis. C5-6:  Disc osteophyte complex with bilateral uncovertebral and facet hypertrophy. Moderate bilateral foraminal stenosis and spinal canal stenosis.  Disc contact on the anterior cord with mild anterior cord flattening. C6-7: Disc osteophyte complex with left-greater-than-right uncovertebral and facet hypertrophy. Moderate left and mild right foraminal stenosis. Mild spinal canal stenosis. C7-T1: No significant disc displacement, foraminal stenosis, or canal stenosis. IMPRESSION: 1. Mild smooth epidural thickening and enhancement posteriorly extending from C2 to below the field of view into the thoracic spine as well as diffuse paraspinal muscle enhancement. Findings may represent a primary myositis (rhabdomyolysis, infection, autoimmune) with reactive epidural inflammation, however, epidural infection is not excluded. No findings of abscess, osteomyelitis, or discitis at this time. 2. Trace right C4-5 facet effusion. 3. 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 Multilevel high-grade foraminal stenosis. Electronically Signed   By: Kristine Garbe M.D.   On: 12/23/2018 22:37   Mr Shoulder Right W Wo Contrast  Result Date: 12/23/2018 CLINICAL DATA:  Fever for the past 1-2 days with confusion. Pain along the right side of neck. EXAM: MRI OF THE RIGHT SHOULDER WITHOUT AND WITH CONTRAST TECHNIQUE: Multiplanar, multisequence MR imaging of the RIGHT shoulder was performed before and after the administration of intravenous contrast. CONTRAST:  10 mL Gadavist COMPARISON:  CT 12/20/2018 of the chest FINDINGS: Rotator cuff: Tendinosis with thickening and intrasubstance edema of the supraspinatus. No surfacing tear is identified of the rotator cuff. Muscles: No muscle atrophy. There is however mild tracking edema along the supraspinatus muscle on the coronal oblique views, series 6/16 and involving the base of neck though partially included. Findings are nonspecific. Biceps long head: Intact biceps labral complex. Mild tendinosis of the vertical portion of the biceps. Acromioclavicular Joint: Normal  acromioclavicular joint. Type II acromion. No subacromial/subdeltoid bursal fluid. Glenohumeral Joint: Enhancement of the small amount of fluid seen within the glenohumeral joint suggesting synovitis. Intra-articular loose bodies are noted within the axillary pouch and along the course of the biceps tendon, series 6/16 and series 6/13 for example. Labrum: Grossly intact, but evaluation is limited by lack of intraarticular fluid. Bones: Remodeled appearance of the glenohumeral joint with osteoarthritic spurring off the inferomedial humeral head. Cartilaginous thinning of the glenoid and humeral head cartilage consistent with osteoarthritis. Subchondral marrow edema of the glenoid and glenoid neck, nonspecific but given subchondral sclerosis, findings may be on the basis of osteoarthritis. Given lack of significant marrow edema of the adjacent humeral head, findings are less likely to represent changes from a septic arthritis. Subchondral cystic change of the humeral head is noted likely degenerative in etiology and/or secondary to chronic impingement. Other: None IMPRESSION: 1. Marrow edema of the glenoid and glenoid neck may be posttraumatic in etiology and representing bone marrow contusion/micro trabecular fracture. Given lack of significant marrow changes the adjacent head, findings are less likely to represent stigmata of a septic arthritis. 2. Enhancing glenohumeral joint fluid more likely to represent a synovitis. The possibility of infected fluid is not entirely excluded and sampling may prove useful for further correlation given history of fever shoulder pain. 3. Advanced osteoarthritis of the glenohumeral joint with remodeled appearance of the glenoid, humeral head spurring and subchondral cystic change across the glenohumeral joint. Mild osteoarthritis of the AC joint. 4. Tendinosis of the supraspinatus tendon. 5. Intra-articular loose bodies within the glenohumeral joint along the course of the biceps  tendon identified likely degenerative in etiology. 6. Tendinosis of the vertical portion of the biceps tendon. 7. Nonspecific  tracking edema and fluid along the supraspinatus tendon and base of included neck. Soft tissue injury is not excluded. Electronically Signed   By: Ashley Royalty M.D.   On: 12/23/2018 23:51     Assessment/Plan:  73 y.o.malewith a history ofcirrhosis of the liver secondary to [facility], diabetes mellitus, left shoulder replacement, bilateral osteoarthritis of the knees, DJD presented to the ED 2 days ago with chills and confusion. He was assessed in the ED was sent home after blood cultures were drawn.He came back within 6 hours as the blood culture came positive for gram-positive cocci which was group B streptococcus.? ? ?Group B streptococcus bacteremia: Unclear source. Usually an organism of GI and GU tract. There is no obvious skin lesions. TEE neg for endocarditis MRI neck -Mild smooth epidural thickening and enhancement posteriorly extending from C2 to below the field of view into the thoracic spine as well as diffuse paraspinal muscle enhancement. Findings may represent a primary myositis (rhabdomyolysis, infection, autoimmune) with reactive epidural inflammation, however, epidural infection is not excluded.    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 Hardware present but no clinical evidence of infection.  Diabetes mellitus on insulin  Recent history of urinary retention needing Foley catheter and September 2019.  post void bladder scan has no residue  Discussed with patient in detail

## 2018-12-24 NOTE — Consult Note (Signed)
ORTHOPAEDIC CONSULTATION  REQUESTING PHYSICIAN: Nicholes Mango, MD  Chief Complaint:   Right-sided neck and shoulder pain.  History of Present Illness: Joshua Berry is a 73 y.o. male with a history of hypertension, cirrhosis, and adult onset diabetes who presented to the hospital 4 days ago with high fevers and positive blood cultures.  He was admitted for IV antibiotics.  MRI scans of his neck and right shoulder were obtained due to continued complaints of pain in these areas.  As a result of the things of these studies, orthopedic consultation was requested.  The patient notes that he has been having some shoulder discomfort for 3 weeks or so but that his posterior neck pain was more severe.  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  He notes that several days ago, he had much more pain in his right trapezial region into his right shoulder and had great difficulty elevating his arm at all.  He still notes weakness of his arm, but already feels that his symptoms are much improved.  He denies any numbness or paresthesias down his arm to his hand.  Past Medical History:  Diagnosis Date  . Arthritis   . Cancer (HCC)    melanoma, carcinoma  . Cirrhosis of liver ([place])   . Diabetes mellitus without complication ([place])   . Hypertension   . Spleen enlarged   . Thrombocytopenia ([place])    Past Surgical History:  Procedure Laterality Date  . CHOLECYSTECTOMY    . SKIN BIOPSY    . TEE WITHOUT CARDIOVERSION N/A 12/23/2018   Procedure: TRANSESOPHAGEAL ECHOCARDIOGRAM (TEE);  Surgeon: Teodoro Spray, MD;  Location: [facility] ORS;  Service: Cardiovascular;  Laterality: N/A;   Social History   Socioeconomic History  . Marital status: Married    Spouse name: Not on file  . Number of  children: Not on file  . Years of education: Not on file  . Highest education level: Not on file  Occupational History  . Not on file  Social Needs  . Financial resource strain: Not on file  . Food insecurity:    Worry: Not on file    Inability: Not on file  . Transportation needs:    Medical: Not on file    Non-medical: Not on file  Tobacco Use  . Smoking status: Former Research scientist (life sciences)  . Smokeless tobacco: Never Used  Substance and Sexual Activity  . Alcohol use: Not on file  . Drug use: Not on file  . Sexual activity: Not on file  Lifestyle  . Physical activity:    Days per week: Not on file    Minutes per session: Not on file  . Stress: Not on file  Relationships  . Social connections:    Talks on phone: Not on file    Gets together: Not on file    Attends religious service: Not on file    Active member of club or organization: Not on file    Attends meetings of clubs or organizations: Not on file    Relationship status: Not on file  Other Topics Concern  . Not on file  Social History Narrative  . Not on file   History reviewed. No pertinent family history. Allergies  Allergen Reactions  . Lisinopril Hives  . Sulfamethoxazole Swelling  . Camphor Rash and Swelling  .  Latex Rash  . Nickel Rash   Prior to Admission medications   Medication Sig Start Date End Date Taking? Authorizing Provider  albuterol (PROVENTIL, VENTOLIN) (5 MG/ML) 0.5% NEBU Inhale 2 puffs into the lungs once.   Yes [provider]  atorvastatin (LIPITOR) 10 MG tablet Take 10 mg by mouth daily.   Yes [provider]  B Complex-C (B-COMPLEX WITH VITAMIN C) tablet Take 1 tablet by mouth daily.   Yes [provider]  Cholecalciferol (VITAMIN D3 PO) Take 1 tablet by mouth 2 (two) times daily.   Yes [provider]  hydrocortisone (ANUSOL-HC) 2.5 % rectal cream Apply rectally 2 times daily as needed for rectal pain or bleeding 08/24/18 08/24/19 Yes Eula Listen, MD   insulin NPH-regular Human (NOVOLIN 70/30) (70-30) 100 UNIT/ML injection Inject 60 Units into the skin daily with breakfast.    Yes [provider]  levothyroxine (SYNTHROID, LEVOTHROID) 100 MCG tablet Take 100 mcg by mouth daily before breakfast.   Yes [provider]  loperamide (IMODIUM A-D) 2 MG tablet Take 1 tablet (2 mg total) by mouth 4 (four) times daily as needed for diarrhea or loose stools. 08/24/18  Yes Eula Listen, MD  magnesium gluconate (MAGONATE) 500 MG tablet Take 1 tablet by mouth 3 (three) times a week.   Yes [provider]  omeprazole (PRILOSEC) 20 MG capsule Take 20 mg by mouth daily.   Yes [provider]  ondansetron (ZOFRAN ODT) 4 MG disintegrating tablet Take 1 tablet (4 mg total) by mouth every 8 (eight) hours as needed for nausea or vomiting. 08/24/18  Yes Eula Listen, MD  tamsulosin (FLOMAX) 0.4 MG CAPS capsule Take 1 capsule by mouth daily.   Yes [provider]  traMADol (ULTRAM) 50 MG tablet Take 50 mg by mouth every 12 (twelve) hours as needed (for shoulder pain).   Yes [provider]  zinc sulfate 220 (50 Zn) MG capsule Take 1 capsule by mouth daily.   Yes [provider]  ipratropium-albuterol (DUONEB) 0.5-2.5 (3) MG/3ML SOLN Take 3 mLs by nebulization every 4 (four) hours as needed.     [provider]  metFORMIN (GLUMETZA) 500 MG (MOD) 24 hr tablet Take 500 mg by mouth at bedtime.    [provider]  oxyCODONE (OXYCONTIN) 10 mg 12 hr tablet Take 1 tablet (10 mg total) by mouth every 12 (twelve) hours. Patient not taking: Reported on 08/24/2018 07/13/18   Cuthriell, Charline Bills, PA-C  phenazopyridine (PYRIDIUM) 200 MG tablet Take 1 tablet (200 mg total) by mouth 3 (three) times daily as needed for pain. Patient not taking: Reported on 12/20/2018 09/08/18   Sable Feil, PA-C  predniSONE (DELTASONE) 50 MG tablet Take 1 tablet (50 mg total) by mouth daily with  breakfast. Patient not taking: Reported on 08/24/2018 07/13/18   Cuthriell, Charline Bills, PA-C   Mr Cervical Spine W Wo Contrast  Result Date: 12/23/2018 CLINICAL DATA:  73 y/o M; worsening right-sided neck pain for 3 weeks. Recent onset fever. Bacteremia. Rule out infection. EXAM: MRI CERVICAL SPINE WITHOUT AND WITH CONTRAST TECHNIQUE: Multiplanar and multiecho pulse sequences of the cervical spine, to include the craniocervical junction and cervicothoracic junction, were obtained without and with intravenous contrast. CONTRAST:  10 cc Gadavist COMPARISON:  None. FINDINGS: Alignment: Straightening of cervical lordosis without listhesis. Vertebrae: No acute fracture or findings of discitis. No significant bone marrow edema. Cord: No abnormal cord signal or enhancement. Mild smooth epidural thickening and enhancement of the  predominantly posterior epidural space extending from C2 down below the field of view (series 16, image 15 and series 7, image 15). There is mild enhancement and thickening of the right sided epidural space at the C4-5 level (series 17, image 12). Additionally, there is edema and enhancement within right greater than left paraspinal muscles. No discrete abscess identified. Posterior Fossa, vertebral arteries, paraspinal tissues: As above. Disc levels: C2-3: Left-sided facet hypertrophy with mild left foraminal stenosis. No right foraminal or canal stenosis. C3-4: Left-sided uncovertebral hypertrophy, left foraminal disc protrusion, and left-greater-than-right facet hypertrophy. Mild right and moderate to severe left foraminal stenosis. Mild spinal canal stenosis. C4-5: Right greater than left uncovertebral and facet hypertrophy with disc bulge eccentric to the right foraminal zone. Severe right and mild left foraminal stenosis. Mild spinal canal stenosis. C5-6: Disc osteophyte complex with bilateral uncovertebral and facet hypertrophy. Moderate bilateral foraminal stenosis and spinal canal  stenosis. Disc contact on the anterior cord with mild anterior cord flattening. C6-7: Disc osteophyte complex with left-greater-than-right uncovertebral and facet hypertrophy. Moderate left and mild right foraminal stenosis. Mild spinal canal stenosis. C7-T1: No significant disc displacement, foraminal stenosis, or canal stenosis. IMPRESSION: 1. Mild smooth epidural thickening and enhancement posteriorly extending from C2 to below the field of view into the thoracic spine as well as diffuse paraspinal muscle enhancement. Findings may represent a primary myositis (rhabdomyolysis, infection, autoimmune) with reactive epidural inflammation, however, epidural infection is not excluded. No findings of abscess, osteomyelitis, or discitis at this time. 2. Trace right C4-5 facet effusion. 3. 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 Multilevel high-grade foraminal stenosis. Electronically Signed   By: Kristine Garbe M.D.   On: 12/23/2018 22:37   Mr Shoulder Right W Wo Contrast  Result Date: 12/23/2018 CLINICAL DATA:  Fever for the past 1-2 days with confusion. Pain along the right side of neck. EXAM: MRI OF THE RIGHT SHOULDER WITHOUT AND WITH CONTRAST TECHNIQUE: Multiplanar, multisequence MR imaging of the RIGHT shoulder was performed before and after the administration of intravenous contrast. CONTRAST:  10 mL Gadavist COMPARISON:  CT 12/20/2018 of the chest FINDINGS: Rotator cuff: Tendinosis with thickening and intrasubstance edema of the supraspinatus. No surfacing tear is identified of the rotator cuff. Muscles: No muscle atrophy. There is however mild tracking edema along the supraspinatus muscle on the coronal oblique views, series 6/16 and involving the base of neck though partially included. Findings are nonspecific. Biceps long head: Intact biceps labral complex. Mild tendinosis of the vertical portion of the biceps. Acromioclavicular Joint:  Normal acromioclavicular joint. Type II acromion. No subacromial/subdeltoid bursal fluid. Glenohumeral Joint: Enhancement of the small amount of fluid seen within the glenohumeral joint suggesting synovitis. Intra-articular loose bodies are noted within the axillary pouch and along the course of the biceps tendon, series 6/16 and series 6/13 for example. Labrum: Grossly intact, but evaluation is limited by lack of intraarticular fluid. Bones: Remodeled appearance of the glenohumeral joint with osteoarthritic spurring off the inferomedial humeral head. Cartilaginous thinning of the glenoid and humeral head cartilage consistent with osteoarthritis. Subchondral marrow edema of the glenoid and glenoid neck, nonspecific but given subchondral sclerosis, findings may be on the basis of osteoarthritis. Given lack of significant marrow edema of the adjacent humeral head, findings are less likely to represent changes from a septic arthritis. Subchondral cystic change of the humeral head is noted likely degenerative in etiology and/or secondary to chronic impingement. Other: None IMPRESSION: 1. Marrow edema of the  glenoid and glenoid neck may be posttraumatic in etiology and representing bone marrow contusion/micro trabecular fracture. Given lack of significant marrow changes the adjacent head, findings are less likely to represent stigmata of a septic arthritis. 2. Enhancing glenohumeral joint fluid more likely to represent a synovitis. The possibility of infected fluid is not entirely excluded and sampling may prove useful for further correlation given history of fever shoulder pain. 3. Advanced osteoarthritis of the glenohumeral joint with remodeled appearance of the glenoid, humeral head spurring and subchondral cystic change across the glenohumeral joint. Mild osteoarthritis of the AC joint. 4. Tendinosis of the supraspinatus tendon. 5. Intra-articular loose bodies within the glenohumeral joint along the course of the  biceps tendon identified likely degenerative in etiology. 6. Tendinosis of the vertical portion of the biceps tendon. 7. Nonspecific tracking edema and fluid along the supraspinatus tendon and base of included neck. Soft tissue injury is not excluded. Electronically Signed   By: Ashley Royalty M.D.   On: 12/23/2018 23:51   [place] Ekg Site Rite  Result Date: 12/24/2018 If Site Rite image not attached, placement could not be confirmed due to current cardiac rhythm.   Positive ROS: All other systems have been reviewed and were otherwise negative with the exception of those mentioned in the HPI and as above.  Physical Exam: General:  Alert, no acute distress Psychiatric:  Patient is competent for consent with normal mood and affect   Cardiovascular:  No pedal edema Respiratory:  No wheezing, non-labored breathing GI:  Abdomen is soft and non-tender Skin:  No lesions in the area of chief complaint Neurologic:  Sensation intact distally Lymphatic:  No axillary or cervical lymphadenopathy  Orthopedic Exam:  Orthopedic examination is limited to the neck, as well as to the right shoulder and upper extremity.  Skin inspection of the neck demonstrates a localized area of redness over the posterior aspect of his mid cervical spine with mild tenderness to palpation in this area.  Is due to pain whether this redness is due to erythema from a possible underlying soft tissue infection or whether it is a "birthmark".  Neck motion is limited secondary to underlying cervical spondylolysis.  He is able to flex to approximate 35 degrees, extend to -10 degrees, turned to the left and to the right 45 degrees, and tilt to the left into the right 10 degrees.  He has at most minimal discomfort with these motions.  Skin inspection of the right shoulder is unremarkable.  There is no swelling, erythema, ecchymosis, abrasions, or other skin abnormalities identified.  He has no tenderness palpation along the trapezius, nor is or any  tenderness over the anterior or lateral aspects of the shoulder.  Passively, he is able tolerate forward flexion to 140 degrees, abduction to 130 degrees, extension to 35 degrees, and internal and external rotation with his arm at his side without any discomfort.  Active range of motion is more limited as he appears to have weakness with attempted active forward flexion and abduction, especially at or above shoulder level.  He is neurovascularly intact to the right forearm and hand.  X-rays:  A recent MRI scan of the right shoulder is available for review and has been reviewed by myself.  By report, the scan demonstrates evidence of advanced degenerative changes of the glenohumeral joint with loose bodies and a physiologic amount of fluid.  There is some bone marrow edema in the glenoid, quite consistent with underlying osteoarthritis.  There also is evidence of soft  tissue streaking in the supraspinatus muscle region between the supraspinatus and overlying trapezius muscle without evidence for any abscess or other fluid collection noted.  A recent MRI scan of the cervical spine also is available for review and has been reviewed by myself.  This scan demonstrates evidence of significant multilevel cervical spondylosis most pronounced at C5-6 with moderate central stenosis at this level.  There also is evidence of soft tissue enhancement in the posterior cervical region which extends below the level of the study.  Assessment: 1.  Apparent myositis involving the right trapezius and supraspinatus muscles, possibly originating from the posterior cervical region by history, without distinct fluid collection or abscess. 2.  Advanced degenerative joint disease of right glenohumeral joint without clinical evidence for septic joint. 3.  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  Plan: Treatment options have been discussed with the patient.  Based on his examination  findings, I do not feel that he has a septic right shoulder joint, nor does he appear to have any abscesses or other soft tissue findings that might require surgical intervention at this time.  Furthermore, he feels that his symptoms have improved definitely over the past 3 to 4 days.  Therefore, I agree with the Infectious Disease consultant's plan for prolonged IV antibiotics through a PICC line.  Thank you for asking me to participate in the care of this delightful man.  He states that he expects to be discharged tomorrow, so I will sign off at this time, but am available if his symptoms worsen or his clinical picture changes.  Also, the patient has been invited to contact the office if he has any further shoulder issues.   Pascal Lux, MD  Beeper #:  [phone]  12/24/2018 7:13 PM

## 2018-12-24 NOTE — Progress Notes (Signed)
PHARMACY CONSULT NOTE FOR:  OUTPATIENT PARENTERAL ANTIBIOTIC THERAPY (OPAT)  Indication: GBS bacteremia/ paraspinal myositis  Regimen: Ceftriaxone 2g q 24H End date: 01/31/2019  IV antibiotic discharge orders are pended. To discharging provider: please sign these orders via discharge navigator,  Select New Orders & click on the button choice - Manage This Unsigned Work.    Thank you for allowing pharmacy to be a part of this patient's care.  Joshua Berry 12/24/2018, 4:53 PM

## 2018-12-24 NOTE — Progress Notes (Signed)
[facility] NAME: Joshua Berry    MR#:  [id number]  DATE OF BIRTH:  1946/05/21  SUBJECTIVE:  Neck pain is 3-7/10   No fevers overnight.  Has h/o chronic spinal stenosis Daughter at bedside,MRI C-spine results discussed  REVIEW OF SYSTEMS:    Review of Systems  Constitutional: no fever, chills no weight loss HENT: Negative for ear pain, nosebleeds, congestion, facial swelling, rhinorrhea, neck pain, neck stiffness and ear discharge.   Respiratory: Negative for cough, shortness of breath, wheezing  Cardiovascular: Negative for chest pain, palpitations and leg swelling.  Gastrointestinal: Negative for heartburn, abdominal pain, vomiting, diarrhea or consitpation Genitourinary: Negative for dysuria, urgency, frequency, hematuria Musculoskeletal: Chronic back pain, acute on chronic neck pain  Neurological: Negative for dizziness, seizures, syncope, focal weakness,  numbness and headaches.  Hematological: Does not bruise/bleed easily.  Psychiatric/Behavioral: Negative for hallucinations, confusion, dysphoric mood    Tolerating Diet: yes      DRUG ALLERGIES:   Allergies  Allergen Reactions  . Lisinopril Hives  . Sulfamethoxazole Swelling  . Camphor Rash and Swelling  . Latex Rash  . Nickel Rash    VITALS:  Blood pressure 140/68, pulse 70, temperature 98.7 F (37.1 C), temperature source Oral, resp. rate 18, height 6' 2"  (1.88 m), weight 122.7 kg, SpO2 94 %.  PHYSICAL EXAMINATION:  Constitutional: Appears well-developed and well-nourished. No distress. HENT: Normocephalic. Marland Kitchen Oropharynx is clear and moist.  Eyes: Conjunctivae and EOM are normal. PERRLA, no scleral icterus.  Neck: Normal ROM. Neck supple. No JVD. No tracheal deviation. CVS: RRR, S1/S2 +, no murmurs, no gallops, no carotid bruit.  Pulmonary: Effort and breath sounds normal, no stridor, rhonchi, wheezes, rales.  Abdominal: Soft. BS +,  no distension,  tenderness, rebound or guarding.  Musculoskeletal: Normal range of motion. No edema and no tenderness.  Neuro: Alert. CN 2-12 grossly intact. No focal deficits. Skin: Skin is warm and dry. No rash noted. Psychiatric: Normal mood and affect.      LABORATORY PANEL:   CBC Recent Labs  Lab 12/21/18 0430  WBC 8.0  HGB 9.7*  HCT 29.8*  PLT 45*   ------------------------------------------------------------------------------------------------------------------  Chemistries  Recent Labs  Lab 12/20/18 1712  12/24/18 0555  NA 136   < > 135  K 3.6   < > 3.5  CL 105   < > 104  CO2 22   < > 23  GLUCOSE 153*   < > 138*  BUN 16   < > 12  CREATININE 1.08   < > 0.81  CALCIUM 8.8*   < > 8.4*  AST 60*  --   --   ALT 36  --   --   ALKPHOS 74  --   --   BILITOT 2.0*  --   --    < > = values in this interval not displayed.   ------------------------------------------------------------------------------------------------------------------  Cardiac Enzymes No results for input(s): TROPONINI in the last 168 hours. ------------------------------------------------------------------------------------------------------------------  RADIOLOGY:  Mr Cervical Spine W Wo Contrast  Result Date: 12/23/2018 CLINICAL DATA:  73 y/o M; worsening right-sided neck pain for 3 weeks. Recent onset fever. Bacteremia. Rule out infection. EXAM: MRI CERVICAL SPINE WITHOUT AND WITH CONTRAST TECHNIQUE: Multiplanar and multiecho pulse sequences of the cervical spine, to include the craniocervical junction and cervicothoracic junction, were obtained without and with intravenous contrast. CONTRAST:  10 cc Gadavist COMPARISON:  None. FINDINGS: Alignment: Straightening of cervical lordosis without listhesis. Vertebrae: No acute  fracture or findings of discitis. No significant bone marrow edema. Cord: No abnormal cord signal or enhancement. Mild smooth epidural thickening and enhancement of the predominantly posterior  epidural space extending from C2 down below the field of view (series 16, image 15 and series 7, image 15). There is mild enhancement and thickening of the right sided epidural space at the C4-5 level (series 17, image 12). Additionally, there is edema and enhancement within right greater than left paraspinal muscles. No discrete abscess identified. Posterior Fossa, vertebral arteries, paraspinal tissues: As above. Disc levels: C2-3: Left-sided facet hypertrophy with mild left foraminal stenosis. No right foraminal or canal stenosis. C3-4: Left-sided uncovertebral hypertrophy, left foraminal disc protrusion, and left-greater-than-right facet hypertrophy. Mild right and moderate to severe left foraminal stenosis. Mild spinal canal stenosis. C4-5: Right greater than left uncovertebral and facet hypertrophy with disc bulge eccentric to the right foraminal zone. Severe right and mild left foraminal stenosis. Mild spinal canal stenosis. C5-6: Disc osteophyte complex with bilateral uncovertebral and facet hypertrophy. Moderate bilateral foraminal stenosis and spinal canal stenosis. Disc contact on the anterior cord with mild anterior cord flattening. C6-7: Disc osteophyte complex with left-greater-than-right uncovertebral and facet hypertrophy. Moderate left and mild right foraminal stenosis. Mild spinal canal stenosis. C7-T1: No significant disc displacement, foraminal stenosis, or canal stenosis. IMPRESSION: 1. Mild smooth epidural thickening and enhancement posteriorly extending from C2 to below the field of view into the thoracic spine as well as diffuse paraspinal muscle enhancement. Findings may represent a primary myositis (rhabdomyolysis, infection, autoimmune) with reactive epidural inflammation, however, epidural infection is not excluded. No findings of abscess, osteomyelitis, or discitis at this time. 2. Trace right C4-5 facet effusion. 3. Advanced cervical spondylosis greatest at the C5-6 level with there is  moderate spinal canal stenosis. Multilevel high-grade foraminal stenosis. Electronically Signed   By: Kristine Garbe M.D.   On: 12/23/2018 22:37   Mr Shoulder Right W Wo Contrast  Result Date: 12/23/2018 CLINICAL DATA:  Fever for the past 1-2 days with confusion. Pain along the right side of neck. EXAM: MRI OF THE RIGHT SHOULDER WITHOUT AND WITH CONTRAST TECHNIQUE: Multiplanar, multisequence MR imaging of the RIGHT shoulder was performed before and after the administration of intravenous contrast. CONTRAST:  10 mL Gadavist COMPARISON:  CT 12/20/2018 of the chest FINDINGS: Rotator cuff: Tendinosis with thickening and intrasubstance edema of the supraspinatus. No surfacing tear is identified of the rotator cuff. Muscles: No muscle atrophy. There is however mild tracking edema along the supraspinatus muscle on the coronal oblique views, series 6/16 and involving the base of neck though partially included. Findings are nonspecific. Biceps long head: Intact biceps labral complex. Mild tendinosis of the vertical portion of the biceps. Acromioclavicular Joint: Normal acromioclavicular joint. Type II acromion. No subacromial/subdeltoid bursal fluid. Glenohumeral Joint: Enhancement of the small amount of fluid seen within the glenohumeral joint suggesting synovitis. Intra-articular loose bodies are noted within the axillary pouch and along the course of the biceps tendon, series 6/16 and series 6/13 for example. Labrum: Grossly intact, but evaluation is limited by lack of intraarticular fluid. Bones: Remodeled appearance of the glenohumeral joint with osteoarthritic spurring off the inferomedial humeral head. Cartilaginous thinning of the glenoid and humeral head cartilage consistent with osteoarthritis. Subchondral marrow edema of the glenoid and glenoid neck, nonspecific but given subchondral sclerosis, findings may be on the basis of osteoarthritis. Given lack of significant marrow edema of the adjacent  humeral head, findings are less likely to represent changes from a septic arthritis.  Subchondral cystic change of the humeral head is noted likely degenerative in etiology and/or secondary to chronic impingement. Other: None IMPRESSION: 1. Marrow edema of the glenoid and glenoid neck may be posttraumatic in etiology and representing bone marrow contusion/micro trabecular fracture. Given lack of significant marrow changes the adjacent head, findings are less likely to represent stigmata of a septic arthritis. 2. Enhancing glenohumeral joint fluid more likely to represent a synovitis. The possibility of infected fluid is not entirely excluded and sampling may prove useful for further correlation given history of fever shoulder pain. 3. Advanced osteoarthritis of the glenohumeral joint with remodeled appearance of the glenoid, humeral head spurring and subchondral cystic change across the glenohumeral joint. Mild osteoarthritis of the AC joint. 4. Tendinosis of the supraspinatus tendon. 5. Intra-articular loose bodies within the glenohumeral joint along the course of the biceps tendon identified likely degenerative in etiology. 6. Tendinosis of the vertical portion of the biceps tendon. 7. Nonspecific tracking edema and fluid along the supraspinatus tendon and base of included neck. Soft tissue injury is not excluded. Electronically Signed   By: Ashley Royalty M.D.   On: 12/23/2018 23:51     ASSESSMENT AND PLAN:   73 year old male with history of liver cirrhosis/Nash, diabetes who presented after a call from the emergency room with positive blood cultures.  1.  Sepsis: Patient presented with fever and tachypnea.  Sepsis is due to group B Streptococcus bacteremia. Continue Rocephin Repeat blood cultures are negative.   ID is following  2.  Group B Streptococcus bacteremia: Continue Rocephin IV Echocardiogram showed no vegetation.   TEE this morning.  Discussed with Dr. Ubaldo Glassing MRI cervical spine has revealed  myositis and high-grade multilevel foraminal stenosis moderate spinal stenosis at C5-C6 MRI of the right shoulder-probably synovitis, consult orthopedics  3.  Hypothyroidism: Continue Synthroid   4.  History of liver cirrhosis/Nash: Patient is status post abdominal ultrasound without evidence of ascites  5.  Hypokalemia: Replete PRN 6. Hyperlipidemia: Continue statin   Management plans discussed with the patient and daughter and they are in agreement.  CODE STATUS: Full  TOTAL TIME TAKING CARE OF THIS PATIENT: 34 minutes.     POSSIBLE D/C 1 to 2 days, DEPENDING ON CLINICAL CONDITION.   Nicholes Mango M.D on 12/24/2018 at 1:37 PM  Between 7am to 6pm - Pager - [phone]  After 6pm go to [email or web address] - password [email or web address] [facility]  Office  [phone]  CC: Primary care physician; Lorelei Pont, MD  Note: This dictation was prepared with Dragon dictation along with smaller phrase technology. Any transcriptional errors that result from this process are unintentional.

## 2018-12-24 NOTE — Treatment Plan (Signed)
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 Lisinopril Hives  . Sulfamethoxazole Swelling  . Camphor Rash and Swelling  . Latex Rash  . Nickel Rash    OPAT Orders Discharge antibiotics: Ceftriaxone 2 grams IV every 24 hours until End Date: 01/31/19  [facility] Care Per Protocol:  Labs weekly while on IV antibiotics: _X_ CBC with differential X__ CMP _X_ CRP _X_ ESR   _X_ Please pull PIC at completion of IV antibiotics  Fax weekly labs to [phone]  Clinic Follow Up Appt:3 weeks  Call [phone] with any critical value or concerns or  to make appt

## 2018-12-25 ENCOUNTER — Inpatient Hospital Stay: Payer: Medicare Other

## 2018-12-25 LAB — GLUCOSE, CAPILLARY
Glucose-Capillary: 139 mg/dL — ABNORMAL HIGH (ref 70–99)
Glucose-Capillary: 216 mg/dL — ABNORMAL HIGH (ref 70–99)
Glucose-Capillary: 182 mg/dL — ABNORMAL HIGH (ref 70–99)

## 2018-12-25 MED ORDER — SODIUM CHLORIDE 0.9 % IV SOLN
2.0000 g | Freq: Every day | INTRAVENOUS | Status: DC
  Administered 2018-12-25: 15:00:00 2 g via INTRAVENOUS
  Filled 2018-12-25: qty 2

## 2018-12-25 MED ORDER — SODIUM CHLORIDE 0.9% FLUSH
10.0000 mL | Freq: Two times a day (BID) | INTRAVENOUS | Status: DC
  Administered 2018-12-25: 10 mL

## 2018-12-25 MED ORDER — TRAMADOL HCL 50 MG PO TABS: 50.0000 mg | ORAL_TABLET | Freq: Two times a day (BID) | ORAL | 0 refills | Status: DC | PRN

## 2018-12-25 MED ORDER — ACETAMINOPHEN 325 MG PO TABS: 650.0000 mg | ORAL_TABLET | Freq: Four times a day (QID) | ORAL | Status: DC | PRN

## 2018-12-25 MED ORDER — SODIUM CHLORIDE 0.9 % IV SOLN: 2.0000 g | INTRAVENOUS | 0 refills | Status: AC

## 2018-12-25 MED ORDER — GUAIFENESIN-DM 100-10 MG/5ML PO SYRP: 5.0000 mL | ORAL_SOLUTION | ORAL | 0 refills | Status: DC | PRN

## 2018-12-25 MED ORDER — SODIUM CHLORIDE 0.9% FLUSH: 10.0000 mL | INTRAVENOUS | Status: DC | PRN

## 2018-12-25 NOTE — Discharge Summary (Signed)
[facility] NAME: Joshua Berry    MR#:  [id number]  DATE OF BIRTH:  July 11, 1946  DATE OF ADMISSION:  12/20/2018 ADMITTING PHYSICIAN: Epifanio Lesches, MD  DATE OF DISCHARGE: 12/25/18  PRIMARY CARE PHYSICIAN: Lorelei Pont, MD    ADMISSION DIAGNOSIS:  Sepsis ([place]) [A41.9] Bacteremia due to group B Streptococcus [R78.81] Cirrhosis of liver without ascites, unspecified hepatic cirrhosis type ([place]) [K74.60]  DISCHARGE DIAGNOSIS:  Active Problems:   Sepsis ([place]) Group B streptococcal bacteremia  SECONDARY DIAGNOSIS:   Past Medical History:  Diagnosis Date  . Arthritis   . Cancer (HCC)    melanoma, carcinoma  . Cirrhosis of liver ([place])   . Diabetes mellitus without complication ([place])   . Hypertension   . Spleen enlarged   . Thrombocytopenia ([place])     HOSPITAL COURSE:   1.  Sepsis: Patient presented with fever and tachypnea.  Sepsis is due to group B Streptococcus bacteremia. Continue Rocephin 2 grams IV every 24 hours until End Date:01/31/19 PICC line placement today continue [facility] Per Protocol: Labs weekly while on IV antibiotics: CBC with differential,CMP CRP, ESR Fax weekly labs to [phone]  Please pull PIC at completion of IV antibiotics Repeat blood cultures are negative.   ID -op f/u in 3 weeks  2.  Group B Streptococcus bacteremia: Continue Rocephin IV Echocardiogram showed no vegetation.   TEE this morning.  Discussed with Dr. 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 Roland Rack he was recommending IV antibiotics as recommended by infectious disease and outpatient follow-up no other interventions needed at this time  3.  Hypothyroidism: Continue Synthroid   4.  History of liver cirrhosis/[place]: Patient is status post abdominal  ultrasound without evidence of ascites  5.  Hypokalemia: Resolved replete PRN  6. Hyperlipidemia: Continue statin  7.  Diabetes mellitus continue metformin but hold 70/30 as patient's blood sugar is on lower side  Fax weekly labs to [phone]   DISCHARGE CONDITIONS:   stable  CONSULTS OBTAINED:  Treatment Team:  Tsosie Billing, MD Yolonda Kida, MD Poggi, Marshall Cork, MD   PROCEDURES  PICC line   DRUG ALLERGIES:   Allergies  Allergen Reactions  . Lisinopril Hives  . Sulfamethoxazole Swelling  . Camphor Rash and Swelling  . Latex Rash  . Nickel Rash    DISCHARGE MEDICATIONS:   Allergies as of 12/25/2018      Reactions   Lisinopril Hives   Sulfamethoxazole Swelling   Camphor Rash, Swelling   Latex Rash   Nickel Rash      Medication List    STOP taking these medications   insulin NPH-regular Human (70-30) 100 UNIT/ML injection   oxyCODONE 10 mg 12 hr tablet Commonly known as:  OXYCONTIN   phenazopyridine 200 MG tablet Commonly known as:  PYRIDIUM   predniSONE 50 MG tablet Commonly known as:  DELTASONE     TAKE these medications   acetaminophen 325 MG tablet Commonly known as:  TYLENOL Take 2 tablets (650 mg total) by mouth every 6 (six) hours as needed for mild pain (or Fever >/= 101).   albuterol (5 MG/ML) 0.5% Nebu Commonly known as:  PROVENTIL, VENTOLIN Inhale 2 puffs into the lungs once.   atorvastatin 10 MG tablet Commonly known as:  LIPITOR Take 10 mg by mouth daily.   B-complex  with vitamin C tablet Take 1 tablet by mouth daily.   cefTRIAXone 2 g in sodium chloride 0.9 % 100 mL Inject 2 g into the vein daily. Start taking on:  December 26, 2018   guaiFENesin-dextromethorphan 100-10 MG/5ML syrup Commonly known as:  ROBITUSSIN DM Take 5 mLs by mouth every 4 (four) hours as needed for cough (chest congestion).   hydrocortisone 2.5 % rectal cream Commonly known as:  ANUSOL-HC Apply rectally 2 times daily as needed for  rectal pain or bleeding   ipratropium-albuterol 0.5-2.5 (3) MG/3ML Soln Commonly known as:  DUONEB Take 3 mLs by nebulization every 4 (four) hours as needed.   levothyroxine 100 MCG tablet Commonly known as:  SYNTHROID, LEVOTHROID Take 100 mcg by mouth daily before breakfast.   loperamide 2 MG tablet Commonly known as:  IMODIUM A-D Take 1 tablet (2 mg total) by mouth 4 (four) times daily as needed for diarrhea or loose stools.   magnesium gluconate 500 MG tablet Commonly known as:  MAGONATE Take 1 tablet by mouth 3 (three) times a week.   metFORMIN 500 MG (MOD) 24 hr tablet Commonly known as:  GLUMETZA Take 500 mg by mouth at bedtime.   omeprazole 20 MG capsule Commonly known as:  PRILOSEC Take 20 mg by mouth daily.   ondansetron 4 MG disintegrating tablet Commonly known as:  ZOFRAN ODT Take 1 tablet (4 mg total) by mouth every 8 (eight) hours as needed for nausea or vomiting.   tamsulosin 0.4 MG Caps capsule Commonly known as:  FLOMAX Take 1 capsule by mouth daily.   traMADol 50 MG tablet Commonly known as:  ULTRAM Take 1 tablet (50 mg total) by mouth every 12 (twelve) hours as needed for moderate pain. What changed:  reasons to take this   VITAMIN D3 PO Take 1 tablet by mouth 2 (two) times daily.   zinc sulfate 220 (50 Zn) MG capsule Take 1 capsule by mouth daily.        DISCHARGE INSTRUCTIONS:   Follow-up with primary care physician in 3 to 4 days Follow-up with infectious disease Dr. Steva Ready in 3 weeks, get weekly labs as ordered and fax them to [phone] Follow-up with Dr. Leslye Peer in 3-4 weeks  DIET:  Diabetic diet  DISCHARGE CONDITION:  Stable  ACTIVITY:  Activity as tolerated  OXYGEN:  Home Oxygen: No.   Oxygen Delivery: room air  DISCHARGE LOCATION:  home   If you experience worsening of your admission symptoms, develop shortness of breath, life threatening emergency, suicidal or homicidal thoughts you must seek medical attention  immediately by calling 911 or calling your MD immediately  if symptoms less severe.  You Must read complete instructions/literature along with all the possible adverse reactions/side effects for all the Medicines you take and that have been prescribed to you. Take any new Medicines after you have completely understood and accpet all the possible adverse reactions/side effects.   Please note  You were cared for by a hospitalist during your hospital stay. If you have any questions about your discharge medications or the care you received while you were in the hospital after you are discharged, you can call the unit and asked to speak with the hospitalist on call if the hospitalist that took care of you is not available. Once you are discharged, your primary care physician will handle any further medical issues. Please note that NO REFILLS for any discharge medications will be authorized once you are discharged, as it is imperative  that you return to your primary care physician (or establish a relationship with a primary care physician if you do not have one) for your aftercare needs so that they can reassess your need for medications and monitor your lab values.     Today  Chief Complaint  Patient presents with  . Weakness  . Fever  . Altered Mental Status   Patient is doing much better.  Denies any complaints.  Looking forward to go home after 2 days IV antibiotic through PICC line  ROS:  CONSTITUTIONAL: Denies fevers, chills. Denies any fatigue, weakness.  EYES: Denies blurry vision, double vision, eye pain. EARS, NOSE, THROAT: Denies tinnitus, ear pain, hearing loss. RESPIRATORY: Denies cough, wheeze, shortness of breath.  CARDIOVASCULAR: Denies chest pain, palpitations, edema.  GASTROINTESTINAL: Denies nausea, vomiting, diarrhea, abdominal pain. Denies bright red blood per rectum. GENITOURINARY: Denies dysuria, hematuria. ENDOCRINE: Denies nocturia or thyroid problems. HEMATOLOGIC AND  LYMPHATIC: Denies easy bruising or bleeding. SKIN: Denies rash or lesion. MUSCULOSKELETAL: Denies pain in neck, back, shoulder, knees, hips or arthritic symptoms.  NEUROLOGIC: Denies paralysis, paresthesias.  PSYCHIATRIC: Denies anxiety or depressive symptoms.   VITAL SIGNS:  Blood pressure 138/70, pulse 66, temperature 97.8 F (36.6 C), resp. rate 18, height _0  (1.88 m), weight 114.7 kg, SpO2 94 %.  I/O:    Intake/Output Summary (Last 24 hours) at 12/25/2018 1408 Last data filed at 12/25/2018 2263 Gross per 24 hour  Intake 364.09 ml  Output 1250 ml  Net -885.91 ml    PHYSICAL EXAMINATION:  GENERAL:  73 y.o.-year-old patient lying in the bed with no acute distress.  EYES: Pupils equal, round, reactive to light and accommodation. No scleral icterus. Extraocular muscles intact.  HEENT: Head atraumatic, normocephalic. Oropharynx and nasopharynx clear.  NECK:  Supple, no jugular venous distention. No thyroid enlargement, no tenderness.  LUNGS: Normal breath sounds bilaterally, no wheezing, rales,rhonchi or crepitation. No use of accessory muscles of respiration.  CARDIOVASCULAR: S1, S2 normal. No murmurs, rubs, or gallops.  ABDOMEN: Soft, non-tender, non-distended. Bowel sounds present. No organomegaly or mass.  EXTREMITIES: No pedal edema, cyanosis, or clubbing.  NEUROLOGIC: Awake alert and oriented x3 sensation intact. Gait not checked.  PSYCHIATRIC: The patient is alert and oriented x 3.  SKIN: No obvious rash, lesion, or ulcer.   DATA REVIEW:   CBC Recent Labs  Lab 12/21/18 0430  WBC 8.0  HGB 9.7*  HCT 29.8*  PLT 45*    Chemistries  Recent Labs  Lab 12/20/18 1712  12/24/18 0555  NA 136   < > 135  K 3.6   < > 3.5  CL 105   < > 104  CO2 22   < > 23  GLUCOSE 153*   < > 138*  BUN 16   < > 12  CREATININE 1.08   < > 0.81  CALCIUM 8.8*   < > 8.4*  AST 60*  --   --   ALT 36  --   --   ALKPHOS 74  --   --   BILITOT 2.0*  --   --    < > = values in this interval  not displayed.    Cardiac Enzymes No results for input(s): TROPONINI in the last 168 hours.  Microbiology Results  Results for orders placed or performed during the hospital encounter of 12/20/18  Blood Culture (routine x 2)     Status: Abnormal   Collection Time: 12/20/18  5:12 PM  Result Value Ref Range  Status   Specimen Description   Final    BLOOD RIGHT ANTECUBITAL Performed at [facility], [address] [place], [place] [address]    Special Requests   Final    BOTTLES DRAWN AEROBIC AND ANAEROBIC Blood Culture adequate volume Performed at [facility], [address] [place], [place] [address]    Culture  Setup Time   Final    GRAM POSITIVE COCCI IN BOTH AEROBIC AND ANAEROBIC BOTTLES CRITICAL RESULT CALLED TO, READ BACK BY AND VERIFIED WITH: DAVID BESANTI ON 12/21/2018 AT 0255 QSD Performed at [facility], [address] [place], [place] [address]    Culture (A)  Final    GROUP B STREP(S.AGALACTIAE)ISOLATED SUSCEPTIBILITIES PERFORMED ON PREVIOUS CULTURE WITHIN THE LAST 5 DAYS. Performed at [facility], [place] [address], [place], [place] [address]    Report Status 12/23/2018 FINAL  Final  Blood Culture (routine x 2)     Status: Abnormal   Collection Time: 12/20/18  5:12 PM  Result Value Ref Range Status   Specimen Description   Final    BLOOD BLOOD RIGHT HAND Performed at [facility], [address] [place], [place] [address]    Special Requests   Final    BOTTLES DRAWN AEROBIC AND ANAEROBIC Blood Culture results may not be optimal due to an excessive volume of blood received in culture bottles Performed at [facility], [address] [place], [place] [address]    Culture  Setup Time   Final    GRAM POSITIVE COCCI IN BOTH AEROBIC AND ANAEROBIC BOTTLES CRITICAL RESULT CALLED TO, READ BACK BY AND VERIFIED WITH: DAVID BESANTI ON 12/21/2018 AT 0255 QSD Performed at [facility], [address]  [place], [place] [address]    Culture (A)  Final    GROUP B STREP(S.AGALACTIAE)ISOLATED SUSCEPTIBILITIES PERFORMED ON PREVIOUS CULTURE WITHIN THE LAST 5 DAYS. Performed at [facility], [place] [address] [place], [place] [address]    Report Status 12/23/2018 FINAL  Final  Culture, blood (Routine X 2) w Reflex to ID Panel     Status: None (Preliminary result)   Collection Time: 12/22/18  1:59 AM  Result Value Ref Range Status   Specimen Description BLOOD RIGHT ARM  Final   Special Requests   Final    BOTTLES DRAWN AEROBIC AND ANAEROBIC Blood Culture results may not be optimal due to an excessive volume of blood received in culture bottles   Culture   Final    NO GROWTH 3 DAYS Performed at [facility], [address] [place], [place] [address]    Report Status PENDING  Incomplete  Culture, blood (Routine X 2) w Reflex to ID Panel     Status: None (Preliminary result)   Collection Time: 12/22/18  2:04 AM  Result Value Ref Range Status   Specimen Description BLOOD RIGHT HAND  Final   Special Requests   Final    BOTTLES DRAWN AEROBIC AND ANAEROBIC Blood Culture adequate volume   Culture   Final    NO GROWTH 3 DAYS Performed at [facility], [address] [place],  [address]    Report Status PENDING  Incomplete  C difficile quick scan w PCR reflex     Status: None   Collection Time: 12/22/18  2:29 PM  Result Value Ref Range Status   C Diff antigen NEGATIVE NEGATIVE Final   C Diff toxin NEGATIVE NEGATIVE Final   C Diff interpretation  No C. difficile detected.  Final    Comment: Performed at [facility], [address] [place],  [address]    RADIOLOGY:  Mr Cervical Spine W Wo Contrast  Result Date: 12/23/2018 CLINICAL DATA:  73 y/o M; worsening right-sided neck pain for 3 weeks. Recent onset fever. Bacteremia. Rule out infection. EXAM: MRI CERVICAL SPINE WITHOUT AND WITH CONTRAST TECHNIQUE: Multiplanar and multiecho pulse sequences of  the cervical spine, to include the craniocervical junction and cervicothoracic junction, were obtained without and with intravenous contrast. CONTRAST:  10 cc Gadavist COMPARISON:  None. FINDINGS: Alignment: Straightening of cervical lordosis without listhesis. Vertebrae: No acute fracture or findings of discitis. No significant bone marrow edema. Cord: No abnormal cord signal or enhancement. Mild smooth epidural thickening and enhancement of the predominantly posterior epidural space extending from C2 down below the field of view (series 16, image 15 and series 7, image 15). There is mild enhancement and thickening of the right sided epidural space at the C4-5 level (series 17, image 12). Additionally, there is edema and enhancement within right greater than left paraspinal muscles. No discrete abscess identified. Posterior Fossa, vertebral arteries, paraspinal tissues: As above. Disc levels: C2-3: Left-sided facet hypertrophy with mild left foraminal stenosis. No right foraminal or canal stenosis. C3-4: Left-sided uncovertebral hypertrophy, left foraminal disc protrusion, and left-greater-than-right facet hypertrophy. Mild right and moderate to severe left foraminal stenosis. Mild spinal canal stenosis. C4-5: Right greater than left uncovertebral and facet hypertrophy with disc bulge eccentric to the right foraminal zone. Severe right and mild left foraminal stenosis. Mild spinal canal stenosis. C5-6: Disc osteophyte complex with bilateral uncovertebral and facet hypertrophy. Moderate bilateral foraminal stenosis and spinal canal stenosis. Disc contact on the anterior cord with mild anterior cord flattening. C6-7: Disc osteophyte complex with left-greater-than-right uncovertebral and facet hypertrophy. Moderate left and mild right foraminal stenosis. Mild spinal canal stenosis. C7-T1: No significant disc displacement, foraminal stenosis, or canal stenosis. IMPRESSION: 1. Mild smooth epidural thickening and  enhancement posteriorly extending from C2 to below the field of view into the thoracic spine as well as diffuse paraspinal muscle enhancement. Findings may represent a primary myositis (rhabdomyolysis, infection, autoimmune) with reactive epidural inflammation, however, epidural infection is not excluded. No findings of abscess, osteomyelitis, or discitis at this time. 2. Trace right C4-5 facet effusion. 3. 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 Multilevel high-grade foraminal stenosis. Electronically Signed   By: Kristine Garbe M.D.   On: 12/23/2018 22:37   Mr Shoulder Right W Wo Contrast  Result Date: 12/23/2018 CLINICAL DATA:  Fever for the past 1-2 days with confusion. Pain along the right side of neck. EXAM: MRI OF THE RIGHT SHOULDER WITHOUT AND WITH CONTRAST TECHNIQUE: Multiplanar, multisequence MR imaging of the RIGHT shoulder was performed before and after the administration of intravenous contrast. CONTRAST:  10 mL Gadavist COMPARISON:  CT 12/20/2018 of the chest FINDINGS: Rotator cuff: Tendinosis with thickening and intrasubstance edema of the supraspinatus. No surfacing tear is identified of the rotator cuff. Muscles: No muscle atrophy. There is however mild tracking edema along the supraspinatus muscle on the coronal oblique views, series 6/16 and involving the base of neck though partially included. Findings are nonspecific. Biceps long head: Intact biceps labral complex. Mild tendinosis of the vertical portion of the biceps. Acromioclavicular Joint: Normal acromioclavicular joint. Type II acromion. No subacromial/subdeltoid bursal fluid. Glenohumeral Joint: Enhancement of the small amount of fluid seen within the glenohumeral joint suggesting  synovitis. Intra-articular loose bodies are noted within the axillary pouch and along the course of the biceps tendon, series 6/16 and series 6/13 for example. Labrum: Grossly intact, but  evaluation is limited by lack of intraarticular fluid. Bones: Remodeled appearance of the glenohumeral joint with osteoarthritic spurring off the inferomedial humeral head. Cartilaginous thinning of the glenoid and humeral head cartilage consistent with osteoarthritis. Subchondral marrow edema of the glenoid and glenoid neck, nonspecific but given subchondral sclerosis, findings may be on the basis of osteoarthritis. Given lack of significant marrow edema of the adjacent humeral head, findings are less likely to represent changes from a septic arthritis. Subchondral cystic change of the humeral head is noted likely degenerative in etiology and/or secondary to chronic impingement. Other: None IMPRESSION: 1. Marrow edema of the glenoid and glenoid neck may be posttraumatic in etiology and representing bone marrow contusion/micro trabecular fracture. Given lack of significant marrow changes the adjacent head, findings are less likely to represent stigmata of a septic arthritis. 2. Enhancing glenohumeral joint fluid more likely to represent a synovitis. The possibility of infected fluid is not entirely excluded and sampling may prove useful for further correlation given history of fever shoulder pain. 3. Advanced osteoarthritis of the glenohumeral joint with remodeled appearance of the glenoid, humeral head spurring and subchondral cystic change across the glenohumeral joint. Mild osteoarthritis of the AC joint. 4. Tendinosis of the supraspinatus tendon. 5. Intra-articular loose bodies within the glenohumeral joint along the course of the biceps tendon identified likely degenerative in etiology. 6. Tendinosis of the vertical portion of the biceps tendon. 7. Nonspecific tracking edema and fluid along the supraspinatus tendon and base of included neck. Soft tissue injury is not excluded. Electronically Signed   By: Ashley Royalty M.D.   On: 12/23/2018 23:51   [place] Ekg Site Rite  Result Date: 12/24/2018 If Site Rite image  not attached, placement could not be confirmed due to current cardiac rhythm.   EKG:   Orders placed or performed during the hospital encounter of 08/07/18  . ED EKG 12-Lead  . ED EKG 12-Lead  . EKG 12-Lead  . EKG 12-Lead      Management plans discussed with the patient, family and they are in agreement.  CODE STATUS:     Code Status Orders  (From admission, onward)         Start     Ordered   12/20/18 1947  Full code  Continuous     12/20/18 1949        Code Status History    This patient has a current code status but no historical code status.      TOTAL TIME TAKING CARE OF THIS PATIENT: 43  minutes.   Note: This dictation was prepared with Dragon dictation along with smaller phrase technology. Any transcriptional errors that result from this process are unintentional.   _0 @  on 12/25/2018 at 2:08 PM  Between 7am to 6pm - Pager - [phone]  After 6pm go to [email or web address] - password [email or web address] [facility]  [facility]  Office  [phone]  CC: Primary care physician; Lorelei Pont, MD

## 2018-12-25 NOTE — Progress Notes (Signed)
Spoke with IV team, stated would call back with information

## 2018-12-25 NOTE — Progress Notes (Signed)
Chest x-ray reviewed after PICC line placement.  Okay to use PICC line and give IV antibiotic Rocephin prior to the discharge

## 2018-12-25 NOTE — Progress Notes (Signed)
   12/25/18 1500  Clinical Encounter Type  Visited With Patient;Family  Visit Type Spiritual support  Ch checked in. Daughter and brother were present. Pt excited to be discharged. Said a blessing.

## 2018-12-25 NOTE — Care Management (Addendum)
Spoke with Ross Marcus, [facility] representative. Requested demographics, medication order, history 7 physical, and infection disease progress note faxed to [phone]. States she will arrange for skilled nursing . Will fax history & physical, infectious disease notes, laboratory studies, radiology reports, cultures, and medication orders to [phone]. PICC line still pending. Shelbie Ammons RN MSN CCM Care Management [phone]

## 2018-12-25 NOTE — Discharge Instructions (Signed)
Follow-up with primary care physician in 3 to 4 days Follow-up with infectious disease Dr. Steva Ready in 3 weeks, get weekly labs as ordered and fax them to [phone] Follow-up with Dr. Leslye Peer in 3-4 weeks

## 2018-12-25 NOTE — Progress Notes (Signed)
Peripherally Inserted Central Catheter/Midline Placement  The IV Nurse has discussed with the patient and/or persons authorized to consent for the patient, the purpose of this procedure and the potential benefits and risks involved with this procedure.  The benefits include less needle sticks, lab draws from the catheter, and the patient may be discharged home with the catheter. Risks include, but not limited to, infection, bleeding, blood clot (thrombus formation), and puncture of an artery; nerve damage and irregular heartbeat and possibility to perform a PICC exchange if needed/ordered by physician.  Alternatives to this procedure were also discussed.  Bard Power PICC patient education guide, fact sheet on infection prevention and patient information card has been provided to patient /or left at bedside. Daughter signed consent.   PICC/Midline Placement Documentation  PICC Single Lumen 12/25/18 PICC Left Brachial 50 cm 0 cm (Active)  Indication for Insertion or Continuance of Line Home intravenous therapies (PICC only) 12/25/2018  2:56 PM  Exposed Catheter (cm) 0 cm 12/25/2018  2:56 PM  Site Assessment Clean;Dry;Intact 12/25/2018  2:56 PM  Line Status Flushed;Saline locked;Blood return noted 12/25/2018  2:56 PM  Dressing Type Transparent;Occlusive;Gauze 12/25/2018  2:56 PM  Dressing Status Clean;Dry;Intact;Antimicrobial disc in place 12/25/2018  2:56 PM  Dressing Change Due 12/27/18 12/25/2018  2:56 PM       [place] Payment 12/25/2018, 2:57 PM

## 2018-12-25 NOTE — Plan of Care (Signed)
Pt is d/ced home.  PICC line placed today.  He will get IV abx through 2/16. Pt was + for strep B.  TEE performed and MRI of neck and R shoulder b/c of pain.  Source of infection unclear.  Had sed rate done and CRP only slightly elevated.  Pt's daughter will transport him home after d/c instructions reviewed.

## 2018-12-25 NOTE — Progress Notes (Signed)
Spoke with nurse regarding PICC placement.  Plan on placing this afternoon.

## 2018-12-25 NOTE — Care Management (Signed)
PICC line report and discharge summary faxed to Infection Clinic at [phone]) PICC line report,  discharge summary, face-to-face, and skilled nursing orders faxed to [facility] [phone]).  No Operative procedures this admission [name] updated. Discussed that [facility] would need to get a telephone call from Infection Physician at [facility] approving the antibiotic before they can complete the order.   Mr Joshua Berry will receive IV Ceftriaxone 2 grams prior to discharge. [facility] representative will contact [name] tomorrow regarding when they will visit and give another dose of Ceftriaxone.  Shelbie Ammons RN MSN CCM Care Management [phone]

## 2018-12-25 NOTE — Care Management Important Message (Signed)
Important Message  Patient Details  Name: Joshua Berry MRN: [id number] Date of Birth: [birth year]   Medicare Important Message Given:  Yes    Juliann Pulse A Rutger Salton 12/25/2018, 11:11 AM

## 2018-12-25 NOTE — Care Management (Signed)
Spoke with Joshua Berry at the bedside. [facility] [place] [facility] provide him with his antibiotics. Telephone call to [facility], spoke with Gerald Stabs. [phone] ext [phone]). Faxed face sheet and Order for Ceftriaxone 2 grams IV every 24 hours to [phone]. Requested a call back as to when the antibiotic would be ready PICC line placement pending. Shelbie Ammons RN MSN CCM Care Management [phone]

## 2018-12-27 LAB — CULTURE, BLOOD (ROUTINE X 2)
Culture: NO GROWTH
Culture: NO GROWTH
Special Requests: ADEQUATE

## 2018-12-29 ENCOUNTER — Telehealth: Payer: Self-pay | Admitting: *Deleted

## 2018-12-29 NOTE — Telephone Encounter (Signed)
Per EMMI report, patient had concerns about unhealing wound and lack of follow up.  Patient discharged 1.10.2020 with home IV antibiotics through [facility].  Patient says that he is in need of an aide. CM discussed that this is not covered by medicare but he says he has VA benefits.  He spoke with Glean Hess [place]oss  [id number] who informed him service would be coordinated ASAP.  CM contacted Maggie and left VM message to return call to discuss aide.  Patient is asking support staff  at  the community to assist with administration of his IV medications three days a week when his daughter leaves.  He declines that the support staff can provide aide assistance.

## 2018-12-29 NOTE — Care Management (Addendum)
Left a second message for [facility] SW and left [facility] contact information. Notified patient.  He states that [facility] may be calling because patient does not have back up caregiver support for administration when his daughter is out of town over the week ends.. Offered  contact information for in home support for aide but would not be covered by medicare.  "Not sure."  CM left voicemail for CM covering the unit 1.15 to reach out to the [facility] regarding aide services and to contact [facility] regarding caregiver concerns.  Patient said he called [facility] to provide the service and "they turned me down."

## 2019-01-21 ENCOUNTER — Encounter: Payer: Self-pay | Admitting: Infectious Diseases

## 2019-01-21 ENCOUNTER — Ambulatory Visit: Payer: Medicare Other | Attending: Infectious Diseases | Admitting: Infectious Diseases

## 2019-01-21 VITALS — BP 121/66 | HR 104 | Temp 97.6°F | Ht 74.0 in | Wt 265.5 lb

## 2019-01-21 DIAGNOSIS — Z91040 Latex allergy status: Secondary | ICD-10-CM

## 2019-01-21 DIAGNOSIS — Z91048 Other nonmedicinal substance allergy status: Secondary | ICD-10-CM

## 2019-01-21 DIAGNOSIS — K7460 Unspecified cirrhosis of liver: Secondary | ICD-10-CM

## 2019-01-21 DIAGNOSIS — M170 Bilateral primary osteoarthritis of knee: Secondary | ICD-10-CM

## 2019-01-21 DIAGNOSIS — Z87891 Personal history of nicotine dependence: Secondary | ICD-10-CM

## 2019-01-21 DIAGNOSIS — A491 Streptococcal infection, unspecified site: Secondary | ICD-10-CM

## 2019-01-21 DIAGNOSIS — E119 Type 2 diabetes mellitus without complications: Secondary | ICD-10-CM

## 2019-01-21 DIAGNOSIS — Z95828 Presence of other vascular implants and grafts: Secondary | ICD-10-CM

## 2019-01-21 DIAGNOSIS — D696 Thrombocytopenia, unspecified: Secondary | ICD-10-CM

## 2019-01-21 DIAGNOSIS — Z96612 Presence of left artificial shoulder joint: Secondary | ICD-10-CM

## 2019-01-21 DIAGNOSIS — Z881 Allergy status to other antibiotic agents status: Secondary | ICD-10-CM

## 2019-01-21 DIAGNOSIS — R7881 Bacteremia: Secondary | ICD-10-CM

## 2019-01-21 DIAGNOSIS — B951 Streptococcus, group B, as the cause of diseases classified elsewhere: Secondary | ICD-10-CM | POA: Diagnosis not present

## 2019-01-21 DIAGNOSIS — Z888 Allergy status to other drugs, medicaments and biological substances status: Secondary | ICD-10-CM

## 2019-01-21 DIAGNOSIS — R161 Splenomegaly, not elsewhere classified: Secondary | ICD-10-CM

## 2019-01-21 DIAGNOSIS — Z794 Long term (current) use of insulin: Secondary | ICD-10-CM

## 2019-01-21 NOTE — Progress Notes (Signed)
NAME: Joshua Berry  DOB: 1946/07/14  MRN: [id number]  Date/Time: 01/21/2019 9:13 AM Subjective:  Hospital discharge follow up  ? Joshua Berry is a 73 y.o. male with a history of diabetes mellitus, cirrhosis of the liver with thrombocytopenia, left shoulder replacement, bilateral osteoarthritis of the knees, is here for follow-up after recent hospitalization between 12/20/2020 12/25/2018 when he was in the hospital admitted for bacteremia secondary to group B streptococcus and also MRI showing epidural thickening and enhancement posteriorly extending from C2 to below the field-of-view into the thoracic spine as well as diffuse paraspinal muscle enhancement the question was whether this was a primary myositis versus infection with reactive in epidural inflammation.  As TEE was neg and he was discharge don IV ceftriaxone-he will complete 6 weeks on 2/16 .  Patient also has a history of cirrhosis with thrombocytopenia. He is doing much better Pain rt shoulder and neck is 90% improved No fever or chills Daughter does the IV injection   Past Medical History:  Diagnosis Date  . Arthritis   . Cancer (HCC)    melanoma, carcinoma  . Cirrhosis of liver ([place])   . Diabetes mellitus without complication ([place])   . Hypertension   . Spleen enlarged   . Thrombocytopenia ([place])     Past Surgical History:  Procedure Laterality Date  . CHOLECYSTECTOMY    . SKIN BIOPSY    . TEE WITHOUT CARDIOVERSION N/A 12/23/2018   Procedure: TRANSESOPHAGEAL ECHOCARDIOGRAM (TEE);  Surgeon: Teodoro Spray, MD;  Location: [facility] ORS;  Service: Cardiovascular;  Laterality: N/A;    Social History   Socioeconomic History  . Marital status: Married    Spouse name: Not on file  . Number of children: Not on file  . Years of education: Not on file  . Highest education level: Not on file  Occupational History  . Not on file  Social Needs  . Financial resource strain: Not on file  . Food insecurity:    Worry: Not on file   Inability: Not on file  . Transportation needs:    Medical: Not on file    Non-medical: Not on file  Tobacco Use  . Smoking status: Former Research scientist (life sciences)  . Smokeless tobacco: Never Used  Substance and Sexual Activity  . Alcohol use: Not on file  . Drug use: Not on file  . Sexual activity: Not on file  Lifestyle  . Physical activity:    Days per week: Not on file    Minutes per session: Not on file  . Stress: Not on file  Relationships  . Social connections:    Talks on phone: Not on file    Gets together: Not on file    Attends religious service: Not on file    Active member of club or organization: Not on file    Attends meetings of clubs or organizations: Not on file    Relationship status: Not on file  . Intimate partner violence:    Fear of current or ex partner: Not on file    Emotionally abused: Not on file    Physically abused: Not on file    Forced sexual activity: Not on file  Other Topics Concern  . Not on file  Social History Narrative  . Not on file    No family history on file. Allergies  Allergen Reactions  . Lisinopril Hives  . Sulfamethoxazole Swelling  . Camphor Rash and Swelling  . Latex Rash  . Nickel Rash    ?  Current Outpatient Medications  Medication Sig Dispense Refill  . albuterol (PROVENTIL, VENTOLIN) (5 MG/ML) 0.5% NEBU Inhale 2 puffs into the lungs once.    Marland Kitchen atorvastatin (LIPITOR) 10 MG tablet Take 10 mg by mouth daily.    . B Complex-C (B-COMPLEX WITH VITAMIN C) tablet Take 1 tablet by mouth daily.    . cefTRIAXone 2 g in sodium chloride 0.9 % 100 mL Inject 2 g into the vein daily. 36 Bag 0  . Cholecalciferol (VITAMIN D3 PO) Take 1 tablet by mouth 2 (two) times daily.    Marland Kitchen guaiFENesin-dextromethorphan (ROBITUSSIN DM) 100-10 MG/5ML syrup Take 5 mLs by mouth every 4 (four) hours as needed for cough (chest congestion). 118 mL 0  . hydrocortisone (ANUSOL-HC) 2.5 % rectal cream Apply rectally 2 times daily as needed for rectal pain or bleeding 30  g 0  . insulin aspart protamine- aspart (NOVOLOG MIX 70/30) (70-30) 100 UNIT/ML injection Inject 60 Units into the skin 2 (two) times daily with a meal.    . levothyroxine (SYNTHROID, LEVOTHROID) 100 MCG tablet Take 100 mcg by mouth daily before breakfast.    . magnesium gluconate (MAGONATE) 500 MG tablet Take 1 tablet by mouth 3 (three) times a week.    Marland Kitchen omeprazole (PRILOSEC) 20 MG capsule Take 40 mg by mouth daily.     . traMADol (ULTRAM) 50 MG tablet Take 1 tablet (50 mg total) by mouth every 12 (twelve) hours as needed for moderate pain. 20 tablet 0  . zinc sulfate 220 (50 Zn) MG capsule Take 1 capsule by mouth daily.    Marland Kitchen acetaminophen (TYLENOL) 325 MG tablet Take 2 tablets (650 mg total) by mouth every 6 (six) hours as needed for mild pain (or Fever >/= 101). (Patient not taking: Reported on 01/21/2019)    . ipratropium-albuterol (DUONEB) 0.5-2.5 (3) MG/3ML SOLN Take 3 mLs by nebulization every 4 (four) hours as needed.     . loperamide (IMODIUM A-D) 2 MG tablet Take 1 tablet (2 mg total) by mouth 4 (four) times daily as needed for diarrhea or loose stools. (Patient not taking: Reported on 01/21/2019) 12 tablet 0  . metFORMIN (GLUMETZA) 500 MG (MOD) 24 hr tablet Take 500 mg by mouth at bedtime.    . ondansetron (ZOFRAN ODT) 4 MG disintegrating tablet Take 1 tablet (4 mg total) by mouth every 8 (eight) hours as needed for nausea or vomiting. (Patient not taking: Reported on 01/21/2019) 6 tablet 0  . tamsulosin (FLOMAX) 0.4 MG CAPS capsule Take 1 capsule by mouth daily.    Marland Kitchen terazosin (HYTRIN) 5 MG capsule Take 5 mg by mouth at bedtime.    . triamcinolone cream (KENALOG) 0.1 % Apply 1 application topically 3 (three) times daily.     No current facility-administered medications for this visit.      Abtx:  Anti-infectives (From admission, onward)   None      REVIEW OF SYSTEMS:  Const: negative fever, negative chills, negative weight loss Eyes: negative diplopia or visual changes, negative eye  pain ENT: negative coryza, negative sore throat Resp: negative cough, hemoptysis, dyspnea Cards: negative for chest pain, palpitations, lower extremity edema GU: negative for frequency, dysuria and hematuria GI: Negative for abdominal pain, diarrhea, bleeding, constipation Skin: negative for rash and pruritus Heme: negative for easy bruising and gum/nose bleeding MS: pain knees, ambulatory difficulty Limited abduction rt shoulder  Neurolo:negative for headaches, dizziness, vertigo, memory problems  Psych: negative for feelings of anxiety, depression  Endocrine: no polyuria or  polydipsia Allergy/Immunology- as noted ?  Objective:  VITALS:  Wt 265 lb 8 oz (120.4 kg)   BMI 34.09 kg/m  PHYSICAL EXAM:  General: Alert, cooperative, no distress, appears stated age.  Head: Normocephalic, without obvious abnormality, atraumatic. Eyes: Conjunctivae clear, anicteric sclerae. Pupils are equal ENT Nares normal. No drainage or sinus tenderness. Lips, mucosa, and tongue normal. No Thrush Neck: Supple, symmetrical, no adenopathy, thyroid: non tender no carotid bruit and no JVD. Back: No CVA tenderness. Lungs: Clear to auscultation bilaterally. No Wheezing or Rhonchi. No rales. Heart: Regular rate and rhythm, no murmur, rub or gallop.systolic murmur Abdomen: Soft, non-tender,not distended. Bowel sounds normal. No masses Extremities:rt shoulder abduction restricted to 80 degrees   no cyanosis. No edema. No clubbing Left picc okay Skin: No rashes or lesions. Or bruising Lymph: Cervical, supraclavicular normal. Neurologic: Grossly non-focal Pertinent Labs Lab Results CBC Cy=urrent labs PLT 55 WBC 2.2 HB 10.5  ? Impression/Recommendation ?73 y.o. male with a history of cirrhosis of the liver secondary to [facility], diabetes mellitus, left shoulder replacement, bilateral osteoarthritis of the knees, DJD , Group B streptococcu sbacteremia ? 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  Hardware present but no clinical evidence of infection.  Diabetes mellitus on insulin  Will complete ceftriaxone on 01/31/19- PICC will be removed following that. Discussed the management with patient and his daughter?

## 2019-01-21 NOTE — Patient Instructions (Addendum)
You are here for follow after discharge-you had Group B streptococcus bacteremia and infection rt shoulder/epidural inflammation which is also being treated as infection . You are finishing 6 weeks on 01/31/19. You are on Ceftiaxone and the PICC can come out on that day. 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

## 2019-01-22 ENCOUNTER — Encounter: Payer: Self-pay | Admitting: *Deleted

## 2019-01-22 ENCOUNTER — Emergency Department
Admission: EM | Admit: 2019-01-22 | Discharge: 2019-01-22 | Disposition: A | Discharge status: Home / Self Care | Payer: Medicare Other | Attending: Emergency Medicine | Admitting: Emergency Medicine

## 2019-01-22 ENCOUNTER — Emergency Department: Payer: Medicare Other

## 2019-01-22 DIAGNOSIS — I10 Essential (primary) hypertension: Secondary | ICD-10-CM | POA: Diagnosis not present

## 2019-01-22 DIAGNOSIS — Y999 Unspecified external cause status: Secondary | ICD-10-CM | POA: Diagnosis not present

## 2019-01-22 DIAGNOSIS — S0990XA Unspecified injury of head, initial encounter: Secondary | ICD-10-CM | POA: Diagnosis not present

## 2019-01-22 DIAGNOSIS — Z87891 Personal history of nicotine dependence: Secondary | ICD-10-CM | POA: Insufficient documentation

## 2019-01-22 DIAGNOSIS — Z79899 Other long term (current) drug therapy: Secondary | ICD-10-CM | POA: Insufficient documentation

## 2019-01-22 DIAGNOSIS — Y9301 Activity, walking, marching and hiking: Secondary | ICD-10-CM | POA: Diagnosis not present

## 2019-01-22 DIAGNOSIS — W01198A Fall on same level from slipping, tripping and stumbling with subsequent striking against other object, initial encounter: Secondary | ICD-10-CM | POA: Diagnosis not present

## 2019-01-22 DIAGNOSIS — S0083XA Contusion of other part of head, initial encounter: Secondary | ICD-10-CM

## 2019-01-22 DIAGNOSIS — W19XXXA Unspecified fall, initial encounter: Secondary | ICD-10-CM

## 2019-01-22 DIAGNOSIS — Y92008 Other place in unspecified non-institutional (private) residence as the place of occurrence of the external cause: Secondary | ICD-10-CM | POA: Insufficient documentation

## 2019-01-22 DIAGNOSIS — Z7984 Long term (current) use of oral hypoglycemic drugs: Secondary | ICD-10-CM | POA: Insufficient documentation

## 2019-01-22 DIAGNOSIS — E119 Type 2 diabetes mellitus without complications: Secondary | ICD-10-CM | POA: Diagnosis not present

## 2019-01-22 DIAGNOSIS — S8002XA Contusion of left knee, initial encounter: Secondary | ICD-10-CM

## 2019-01-22 DIAGNOSIS — S022XXA Fracture of nasal bones, initial encounter for closed fracture: Secondary | ICD-10-CM

## 2019-01-22 LAB — GLUCOSE, CAPILLARY: Glucose-Capillary: 115 mg/dL — ABNORMAL HIGH (ref 70–99)

## 2019-01-22 MED ORDER — TRAMADOL HCL 50 MG PO TABS
50.0000 mg | ORAL_TABLET | Freq: Once | ORAL | Status: AC
  Administered 2019-01-22: 50 mg via ORAL
  Filled 2019-01-22: qty 1

## 2019-01-22 NOTE — ED Notes (Signed)
Pt has new ice pack.  Continued swelling to right eye.  EDP at the bedside.

## 2019-01-22 NOTE — ED Triage Notes (Signed)
Pt was getting groceries out of car when he had a mechanical fall and struck his face on pavement.  No LOC.  Pt has significant swelling above right eyebrow and abrasion to that area and to nose and chin.  Pt has thrombocytopenia, last platelet count pt knows about was 88.  Pt has reports neck pain but states that this began before the fall

## 2019-01-22 NOTE — ED Notes (Signed)
Urinal given to pt

## 2019-01-22 NOTE — Discharge Instructions (Addendum)
You were seen in the emergency department after a fall. Luckily all of your imaging studies did not show any evidence of injuries. Follow-up with you doctor within the next 2-3 days for further evaluation. Sometimes injuries can present at a later time and therefore it is imperative that you return to the emergency room if you have a severe headache, facial droop, neck pain, numbness or weakness of your extremities, slurred speech, difficulty finding words, chest pain, back pain, abdominal pain, or any other new symptoms that were not present during this visit. You may take Tylenol at home for your pain.

## 2019-01-22 NOTE — ED Notes (Signed)
Small ice pack applied to forehead, swelling continues and pt is developing a "black eye"

## 2019-01-22 NOTE — ED Notes (Addendum)
Pt has decided he would prefer to go home rather than wait for a social work consult in the am.Pt and daughter verbalized understanding of d/c instructions and f/u care. No further questions at this time. Pt assisted to the exit via wheelchair.

## 2019-01-22 NOTE — ED Notes (Signed)
Pt does not feel comfortable going home.  Case management was consulted.  Possible PT or rehab.  Pt may go home per EDP if pt and daughter agree, at this time they do not feel comfortable having him leave

## 2019-01-22 NOTE — ED Provider Notes (Signed)
[facility] Emergency Department Provider Note  ____________________________________________  Time seen: Approximately 2:35 PM  I have reviewed the triage vital signs and the nursing notes.   HISTORY  Chief Complaint Fall   HPI IGNACE MANDIGO is a 73 y.o. male with h/o cirrhosis of the liver, thrombocytopenia, DM, HTN who presents for evaluation of mechanical fall and head trauma. Patient reports thatwas walking up the incline of his concrete driveway after getting groceries out of the car when his foot got caught and he fell forward.  He reports hitting his head on the floor.  No LOC.  Patient is not on blood thinners.  He is complaining of 4 out of 10 facial pain.  He denies neck pain, back pain, extremity pain, chest pain or shortness of breath.  He reports that the fall was mechanical in nature with no preceding dizzy spells.   Past Medical History:  Diagnosis Date  . Arthritis   . Cancer (HCC)    melanoma, carcinoma  . Cirrhosis of liver ([place])   . Diabetes mellitus without complication ([place])   . Hypertension   . Spleen enlarged   . Thrombocytopenia [facility])     Patient Active Problem List   Diagnosis Date Noted  . Sepsis ([place]) 12/20/2018    Past Surgical History:  Procedure Laterality Date  . CHOLECYSTECTOMY    . SKIN BIOPSY    . TEE WITHOUT CARDIOVERSION N/A 12/23/2018   Procedure: TRANSESOPHAGEAL ECHOCARDIOGRAM (TEE);  Surgeon: Teodoro Spray, MD;  Location: [facility] ORS;  Service: Cardiovascular;  Laterality: N/A;    Prior to Admission medications   Medication Sig Start Date End Date Taking? Authorizing Provider  acetaminophen (TYLENOL) 325 MG tablet Take 2 tablets (650 mg total) by mouth every 6 (six) hours as needed for mild pain (or Fever >/= 101). Patient not taking: Reported on 01/21/2019 12/25/18   Gouru, Illene Silver, MD  albuterol (PROVENTIL, VENTOLIN) (5 MG/ML) 0.5% NEBU Inhale 2 puffs into the lungs once.    [provider]    atorvastatin (LIPITOR) 10 MG tablet Take 10 mg by mouth daily.    [provider]  B Complex-C (B-COMPLEX WITH VITAMIN C) tablet Take 1 tablet by mouth daily.    [provider]  cefTRIAXone 2 g in sodium chloride 0.9 % 100 mL Inject 2 g into the vein daily. 12/26/18 01/31/19  Nicholes Mango, MD  Cholecalciferol (VITAMIN D3 PO) Take 1 tablet by mouth 2 (two) times daily.    [provider]  guaiFENesin-dextromethorphan (ROBITUSSIN DM) 100-10 MG/5ML syrup Take 5 mLs by mouth every 4 (four) hours as needed for cough (chest congestion). 12/25/18   Nicholes Mango, MD  hydrocortisone (ANUSOL-HC) 2.5 % rectal cream Apply rectally 2 times daily as needed for rectal pain or bleeding 08/24/18 08/24/19  Eula Listen, MD  insulin aspart protamine- aspart (NOVOLOG MIX 70/30) (70-30) 100 UNIT/ML injection Inject 60 Units into the skin 2 (two) times daily with a meal.    [provider]  ipratropium-albuterol (DUONEB) 0.5-2.5 (3) MG/3ML SOLN Take 3 mLs by nebulization every 4 (four) hours as needed.     [provider]  levothyroxine (SYNTHROID, LEVOTHROID) 100 MCG tablet Take 100 mcg by mouth daily before breakfast.    [provider]  loperamide (IMODIUM A-D) 2 MG tablet Take 1 tablet (2 mg total) by mouth 4 (four) times daily as needed for diarrhea or loose stools. Patient not taking: Reported on 01/21/2019 08/24/18   Eula Listen, MD  magnesium gluconate (MAGONATE) 500 MG tablet Take 1 tablet by mouth 3 (three) times a week.    [provider]  metFORMIN (GLUMETZA) 500 MG (MOD) 24 hr tablet Take 500 mg by mouth at bedtime.    [provider]  omeprazole (PRILOSEC) 20 MG capsule Take 40 mg by mouth daily.     [provider]  ondansetron (ZOFRAN ODT) 4 MG disintegrating tablet Take 1 tablet (4 mg total) by mouth every 8 (eight) hours as needed for nausea or vomiting. Patient not taking: Reported on 01/21/2019 08/24/18   Eula Listen, MD  tamsulosin (FLOMAX) 0.4 MG CAPS capsule Take 1 capsule by mouth daily.    [provider]  terazosin (HYTRIN) 5 MG capsule Take 5 mg by mouth at bedtime.    [provider]  traMADol (ULTRAM) 50 MG tablet Take 1 tablet (50 mg total) by mouth every 12 (twelve) hours as needed for moderate pain. 12/25/18   Gouru, Illene Silver, MD  triamcinolone cream (KENALOG) 0.1 % Apply 1 application topically 3 (three) times daily.    [provider]  zinc sulfate 220 (50 Zn) MG capsule Take 1 capsule by mouth daily.    [provider]    Allergies Lisinopril; Sulfamethoxazole; Camphor; Latex; and Nickel  No family history on file.  Social History Social History   Tobacco Use  . Smoking status: Former Research scientist (life sciences)  . Smokeless tobacco: Never Used  Substance Use Topics  . Alcohol use: Not on file  . Drug use: Not on file    Review of Systems Constitutional: Negative for fever. Eyes: Negative for visual changes. ENT: + facial injury. No neck injury Cardiovascular: Negative for chest injury. Respiratory: Negative for shortness of breath. Negative for chest wall injury. Gastrointestinal: Negative for abdominal pain or injury. Genitourinary: Negative for dysuria. Musculoskeletal: Negative for back injury, negative for arm or leg pain. Skin: Negative for laceration/abrasions. Neurological: Negative for head injury.   ____________________________________________   PHYSICAL EXAM:  VITAL SIGNS: ED Triage Vitals  Enc Vitals Group     BP 01/22/19 1329 (!) 133/59     Pulse Rate 01/22/19 1329 65     Resp 01/22/19 1329 16     Temp 01/22/19 1329 98.7 F (37.1 C)     Temp Source 01/22/19 1329 Oral     SpO2 01/22/19 1329 93 %     Weight 01/22/19 1331 265 lb (120.2 kg)     Height 01/22/19 1331 6' 2"  (1.88 m)     Head Circumference --      Peak Flow --      Pain Score 01/22/19 1330 2     Pain Loc --      Pain Edu? --      Excl. in [place]? --    Full  spinal precautions maintained throughout the trauma exam. Constitutional: Alert and oriented. No acute distress. Does not appear intoxicated. HEENT Head: Normocephalic and atraumatic. Face: R forehead hematoma, bruising to chin Ears: No hemotympanum bilaterally. No Battle sign Eyes: No eye injury. PERRL. No raccoon eyes Nose: Bruising to the bridge of the nose. No epistaxis. No rhinorrhea Mouth/Throat: Mucous membranes are moist. No oropharyngeal blood. No dental injury. Airway patent without stridor. Normal voice. Neck: C-collar in place. No midline c-spine tenderness.  Cardiovascular: Normal rate, regular rhythm. Normal and symmetric distal pulses are present in all extremities. Pulmonary/Chest: Chest wall is stable and nontender to palpation/compression. Normal respiratory effort. Breath sounds are normal. No crepitus.  Abdominal: Soft,  nontender, non distended. Musculoskeletal: L knee with bruise. Nontender with normal full range of motion in all extremities. No deformities. No thoracic or lumbar midline spinal tenderness. Pelvis is stable. Skin: Skin is warm, dry and intact. No abrasions or contutions. Psychiatric: Speech and behavior are appropriate. Neurological: Normal speech and language. Moves all extremities to command. No gross focal neurologic deficits are appreciated.  Glascow Coma Score: 4 - Opens eyes on own 6 - Follows simple motor commands 5 - Alert and oriented GCS: 15  ____________________________________________   LABS (all labs ordered are listed, but only abnormal results are displayed)  Labs Reviewed - No data to display ____________________________________________  EKG  none ____________________________________________  RADIOLOGY  I have personally reviewed the images performed during this visit and I agree with the Radiologist's read.   Interpretation by Radiologist:  No results found.     ____________________________________________   PROCEDURES  Procedure(s) performed: None Procedures Critical Care performed:  None ____________________________________________   INITIAL IMPRESSION / ASSESSMENT AND PLAN / ED COURSE  73 y.o. male with h/o cirrhosis of the liver, thrombocytopenia, DM, HTN who presents for evaluation of mechanical fall and head trauma.  Patient arrives hemodynamically stable, neurologically intact, GCS of 15, has several bruises and abrasions to his face and a large right forehead hematoma.  No signs or symptoms of basilar skull fracture.  CT head, cervical spine and maxillofacial are pending. Tramadol given for pain. Tetanus is up to date.     _________________________ 7:15 PM on 01/22/2019 -----------------------------------------  Imaging showing nasal bone fracture but no other acute findings. Patient does not feel comfortable going home. Does not meet criteria for admission. Will consult PT and SW for possible rehab placement.    As part of my medical decision making, I reviewed the following data within the [facility] notes reviewed and incorporated, Old chart reviewed, Radiograph reviewed , Notes from prior ED visits and [place] Controlled Substance Database    Pertinent labs & imaging results that were available during my care of the patient were reviewed by me and considered in my medical decision making (see chart for details).    ____________________________________________   FINAL CLINICAL IMPRESSION(S) / ED DIAGNOSES  Final diagnoses:  None      NEW MEDICATIONS STARTED DURING THIS VISIT:  ED Discharge Orders    None       Note:  This document was prepared using Dragon voice recognition software and may include unintentional dictation errors.    Alfred Levins, [place], MD 01/22/19 [phone]

## 2019-01-22 NOTE — ED Notes (Signed)
Brought pt a sandwich for dinner with chips and diet sprite.  Spoke with pt and family about going home vs staying here.  They are undecided at this time.  Their preference was an admission but as this is not an option they are considering going home.

## 2019-07-19 ENCOUNTER — Other Ambulatory Visit: Payer: Self-pay

## 2019-07-19 ENCOUNTER — Encounter: Payer: Self-pay | Admitting: Emergency Medicine

## 2019-07-19 ENCOUNTER — Inpatient Hospital Stay
Admission: EM | Admit: 2019-07-19 | Discharge: 2019-07-23 | DRG: 377 | Disposition: A | Discharge status: Home Health | Payer: No Typology Code available for payment source | Attending: Internal Medicine | Admitting: Internal Medicine

## 2019-07-19 DIAGNOSIS — K766 Portal hypertension: Secondary | ICD-10-CM | POA: Diagnosis present

## 2019-07-19 DIAGNOSIS — Z833 Family history of diabetes mellitus: Secondary | ICD-10-CM

## 2019-07-19 DIAGNOSIS — K5730 Diverticulosis of large intestine without perforation or abscess without bleeding: Secondary | ICD-10-CM | POA: Diagnosis present

## 2019-07-19 DIAGNOSIS — D123 Benign neoplasm of transverse colon: Secondary | ICD-10-CM | POA: Diagnosis present

## 2019-07-19 DIAGNOSIS — D500 Iron deficiency anemia secondary to blood loss (chronic): Secondary | ICD-10-CM | POA: Diagnosis present

## 2019-07-19 DIAGNOSIS — K921 Melena: Secondary | ICD-10-CM | POA: Diagnosis present

## 2019-07-19 DIAGNOSIS — K644 Residual hemorrhoidal skin tags: Secondary | ICD-10-CM | POA: Diagnosis present

## 2019-07-19 DIAGNOSIS — I8510 Secondary esophageal varices without bleeding: Secondary | ICD-10-CM | POA: Diagnosis present

## 2019-07-19 DIAGNOSIS — K56600 Partial intestinal obstruction, unspecified as to cause: Secondary | ICD-10-CM | POA: Diagnosis present

## 2019-07-19 DIAGNOSIS — N179 Acute kidney failure, unspecified: Secondary | ICD-10-CM | POA: Diagnosis not present

## 2019-07-19 DIAGNOSIS — I10 Essential (primary) hypertension: Secondary | ICD-10-CM | POA: Diagnosis present

## 2019-07-19 DIAGNOSIS — Z8249 Family history of ischemic heart disease and other diseases of the circulatory system: Secondary | ICD-10-CM

## 2019-07-19 DIAGNOSIS — Z8349 Family history of other endocrine, nutritional and metabolic diseases: Secondary | ICD-10-CM

## 2019-07-19 DIAGNOSIS — D122 Benign neoplasm of ascending colon: Secondary | ICD-10-CM | POA: Diagnosis present

## 2019-07-19 DIAGNOSIS — E876 Hypokalemia: Secondary | ICD-10-CM | POA: Diagnosis present

## 2019-07-19 DIAGNOSIS — K7581 Nonalcoholic steatohepatitis (NASH): Secondary | ICD-10-CM | POA: Diagnosis not present

## 2019-07-19 DIAGNOSIS — R188 Other ascites: Secondary | ICD-10-CM | POA: Diagnosis present

## 2019-07-19 DIAGNOSIS — Z794 Long term (current) use of insulin: Secondary | ICD-10-CM

## 2019-07-19 DIAGNOSIS — Z96612 Presence of left artificial shoulder joint: Secondary | ICD-10-CM | POA: Diagnosis present

## 2019-07-19 DIAGNOSIS — Z87891 Personal history of nicotine dependence: Secondary | ICD-10-CM

## 2019-07-19 DIAGNOSIS — Z20828 Contact with and (suspected) exposure to other viral communicable diseases: Secondary | ICD-10-CM | POA: Diagnosis present

## 2019-07-19 DIAGNOSIS — K648 Other hemorrhoids: Secondary | ICD-10-CM | POA: Diagnosis present

## 2019-07-19 DIAGNOSIS — K31811 Angiodysplasia of stomach and duodenum with bleeding: Principal | ICD-10-CM | POA: Diagnosis present

## 2019-07-19 DIAGNOSIS — E8881 Metabolic syndrome: Secondary | ICD-10-CM | POA: Diagnosis present

## 2019-07-19 DIAGNOSIS — R52 Pain, unspecified: Secondary | ICD-10-CM

## 2019-07-19 DIAGNOSIS — I998 Other disorder of circulatory system: Secondary | ICD-10-CM | POA: Diagnosis present

## 2019-07-19 DIAGNOSIS — R509 Fever, unspecified: Secondary | ICD-10-CM | POA: Diagnosis not present

## 2019-07-19 DIAGNOSIS — Z79899 Other long term (current) drug therapy: Secondary | ICD-10-CM

## 2019-07-19 DIAGNOSIS — D649 Anemia, unspecified: Secondary | ICD-10-CM | POA: Diagnosis not present

## 2019-07-19 DIAGNOSIS — E039 Hypothyroidism, unspecified: Secondary | ICD-10-CM | POA: Diagnosis present

## 2019-07-19 DIAGNOSIS — D6959 Other secondary thrombocytopenia: Secondary | ICD-10-CM | POA: Diagnosis present

## 2019-07-19 DIAGNOSIS — E119 Type 2 diabetes mellitus without complications: Secondary | ICD-10-CM | POA: Diagnosis not present

## 2019-07-19 DIAGNOSIS — K922 Gastrointestinal hemorrhage, unspecified: Secondary | ICD-10-CM | POA: Diagnosis not present

## 2019-07-19 DIAGNOSIS — Z888 Allergy status to other drugs, medicaments and biological substances status: Secondary | ICD-10-CM

## 2019-07-19 DIAGNOSIS — M170 Bilateral primary osteoarthritis of knee: Secondary | ICD-10-CM | POA: Diagnosis present

## 2019-07-19 DIAGNOSIS — Z882 Allergy status to sulfonamides status: Secondary | ICD-10-CM

## 2019-07-19 DIAGNOSIS — Z85820 Personal history of malignant melanoma of skin: Secondary | ICD-10-CM

## 2019-07-19 DIAGNOSIS — Z91040 Latex allergy status: Secondary | ICD-10-CM

## 2019-07-19 DIAGNOSIS — Z79890 Hormone replacement therapy (postmenopausal): Secondary | ICD-10-CM

## 2019-07-19 DIAGNOSIS — K652 Spontaneous bacterial peritonitis: Secondary | ICD-10-CM | POA: Diagnosis present

## 2019-07-19 DIAGNOSIS — E8770 Fluid overload, unspecified: Secondary | ICD-10-CM | POA: Diagnosis present

## 2019-07-19 DIAGNOSIS — Z9109 Other allergy status, other than to drugs and biological substances: Secondary | ICD-10-CM

## 2019-07-19 DIAGNOSIS — K7290 Hepatic failure, unspecified without coma: Secondary | ICD-10-CM | POA: Diagnosis not present

## 2019-07-19 DIAGNOSIS — K31819 Angiodysplasia of stomach and duodenum without bleeding: Secondary | ICD-10-CM | POA: Diagnosis not present

## 2019-07-19 DIAGNOSIS — K7460 Unspecified cirrhosis of liver: Secondary | ICD-10-CM | POA: Diagnosis present

## 2019-07-19 DIAGNOSIS — D696 Thrombocytopenia, unspecified: Secondary | ICD-10-CM | POA: Diagnosis not present

## 2019-07-19 DIAGNOSIS — M25511 Pain in right shoulder: Secondary | ICD-10-CM | POA: Diagnosis not present

## 2019-07-19 DIAGNOSIS — M19011 Primary osteoarthritis, right shoulder: Secondary | ICD-10-CM | POA: Diagnosis present

## 2019-07-19 DIAGNOSIS — R011 Cardiac murmur, unspecified: Secondary | ICD-10-CM | POA: Diagnosis not present

## 2019-07-19 HISTORY — DX: Iron deficiency anemia, unspecified: D50.9

## 2019-07-19 HISTORY — DX: Sleep apnea, unspecified: G47.30

## 2019-07-19 HISTORY — DX: Cardiac murmur, unspecified: R01.1

## 2019-07-19 HISTORY — DX: Nonalcoholic steatohepatitis (NASH): K75.81

## 2019-07-19 LAB — CBC
HCT: 20.8 % — ABNORMAL LOW (ref 39.0–52.0)
Hemoglobin: 6.3 g/dL — ABNORMAL LOW (ref 13.0–17.0)
MCH: 29.2 pg (ref 26.0–34.0)
MCHC: 30.3 g/dL (ref 30.0–36.0)
MCV: 96.3 fL (ref 80.0–100.0)
Platelets: 75 10*3/uL — ABNORMAL LOW (ref 150–400)
RBC: 2.16 MIL/uL — ABNORMAL LOW (ref 4.22–5.81)
RDW: 17.2 % — ABNORMAL HIGH (ref 11.5–15.5)
WBC: 3 10*3/uL — ABNORMAL LOW (ref 4.0–10.5)
nRBC: 0 % (ref 0.0–0.2)

## 2019-07-19 LAB — ABO/RH: ABO/RH(D): O NEG

## 2019-07-19 LAB — COMPREHENSIVE METABOLIC PANEL
ALT: 30 U/L (ref 0–44)
AST: 44 U/L — ABNORMAL HIGH (ref 15–41)
Albumin: 3.2 g/dL — ABNORMAL LOW (ref 3.5–5.0)
Alkaline Phosphatase: 67 U/L (ref 38–126)
Anion gap: 7 (ref 5–15)
BUN: 16 mg/dL (ref 8–23)
CO2: 25 mmol/L (ref 22–32)
Calcium: 8.6 mg/dL — ABNORMAL LOW (ref 8.9–10.3)
Chloride: 108 mmol/L (ref 98–111)
Creatinine, Ser: 1.11 mg/dL (ref 0.61–1.24)
GFR calc Af Amer: >60 mL/min (ref >60)
GFR calc non Af Amer: >60 mL/min (ref >60)
Glucose, Bld: 174 mg/dL — ABNORMAL HIGH (ref 70–99)
Potassium: 4 mmol/L (ref 3.5–5.1)
Sodium: 140 mmol/L (ref 135–145)
Total Bilirubin: 0.7 mg/dL (ref 0.3–1.2)
Total Protein: 6.6 g/dL (ref 6.5–8.1)

## 2019-07-19 LAB — SAMPLE TO BLOOD BANK

## 2019-07-19 LAB — PREPARE RBC (CROSSMATCH)

## 2019-07-19 MED ORDER — TAMSULOSIN HCL 0.4 MG PO CAPS
0.4000 mg | ORAL_CAPSULE | Freq: Every day | ORAL | Status: DC
  Filled 2019-07-19: qty 1

## 2019-07-19 MED ORDER — SODIUM CHLORIDE 0.9 % IV SOLN
80.0000 mg | Freq: Once | INTRAVENOUS | Status: AC
  Administered 2019-07-20: 80 mg via INTRAVENOUS
  Filled 2019-07-19 (×2): qty 80

## 2019-07-19 MED ORDER — POTASSIUM CHLORIDE 10 MEQ/100ML IV SOLN
10.0000 meq | INTRAVENOUS | Status: AC
  Filled 2019-07-19 (×2): qty 100

## 2019-07-19 MED ORDER — INSULIN ASPART 100 UNIT/ML ~~LOC~~ SOLN
0.0000 [IU] | Freq: Three times a day (TID) | SUBCUTANEOUS | Status: DC
  Administered 2019-07-20: 3 [IU] via SUBCUTANEOUS
  Administered 2019-07-21 (×2): 2 [IU] via SUBCUTANEOUS
  Administered 2019-07-21 – 2019-07-22 (×3): 3 [IU] via SUBCUTANEOUS
  Administered 2019-07-23: 5 [IU] via SUBCUTANEOUS
  Filled 2019-07-19 (×7): qty 1

## 2019-07-19 MED ORDER — SODIUM CHLORIDE 0.9% FLUSH
3.0000 mL | Freq: Once | INTRAVENOUS | Status: AC
  Administered 2019-07-19: 3 mL via INTRAVENOUS

## 2019-07-19 MED ORDER — ONDANSETRON HCL 4 MG PO TABS
4.0000 mg | ORAL_TABLET | Freq: Four times a day (QID) | ORAL | Status: DC | PRN
  Filled 2019-07-19: qty 1

## 2019-07-19 MED ORDER — SODIUM CHLORIDE 0.9 % IV SOLN
10.0000 mL/h | Freq: Once | INTRAVENOUS | Status: AC
  Administered 2019-07-19: 10 mL/h via INTRAVENOUS

## 2019-07-19 MED ORDER — INSULIN ASPART PROT & ASPART (70-30 MIX) 100 UNIT/ML ~~LOC~~ SUSP: 60.0000 [IU] | Freq: Two times a day (BID) | SUBCUTANEOUS | Status: DC

## 2019-07-19 MED ORDER — ATORVASTATIN CALCIUM 20 MG PO TABS
10.0000 mg | ORAL_TABLET | Freq: Every day | ORAL | Status: DC
  Filled 2019-07-19: qty 1

## 2019-07-19 MED ORDER — SODIUM CHLORIDE 0.9 % IV SOLN
50.0000 ug/h | INTRAVENOUS | Status: DC
  Administered 2019-07-19 – 2019-07-22 (×6): 50 ug/h via INTRAVENOUS
  Filled 2019-07-19 (×12): qty 1

## 2019-07-19 MED ORDER — ZINC SULFATE 220 (50 ZN) MG PO CAPS
220.0000 mg | ORAL_CAPSULE | Freq: Every day | ORAL | Status: DC
  Administered 2019-07-20 – 2019-07-22 (×3): 220 mg via ORAL
  Filled 2019-07-19 (×4): qty 1

## 2019-07-19 MED ORDER — LEVOTHYROXINE SODIUM 100 MCG PO TABS
100.0000 ug | ORAL_TABLET | Freq: Every day | ORAL | Status: DC
  Administered 2019-07-22 – 2019-07-23 (×2): 100 ug via ORAL
  Filled 2019-07-19 (×3): qty 1

## 2019-07-19 MED ORDER — B COMPLEX-C PO TABS
1.0000 | ORAL_TABLET | Freq: Every day | ORAL | Status: DC
  Administered 2019-07-20 – 2019-07-22 (×3): 1 via ORAL
  Filled 2019-07-19 (×4): qty 1

## 2019-07-19 MED ORDER — IPRATROPIUM-ALBUTEROL 0.5-2.5 (3) MG/3ML IN SOLN
3.0000 mL | Freq: Four times a day (QID) | RESPIRATORY_TRACT | Status: DC | PRN
  Administered 2019-07-20 (×2): 3 mL via RESPIRATORY_TRACT
  Filled 2019-07-19 (×3): qty 3

## 2019-07-19 MED ORDER — SODIUM CHLORIDE 0.9 % IV SOLN
8.0000 mg/h | INTRAVENOUS | Status: DC
  Administered 2019-07-19 – 2019-07-22 (×6): 8 mg/h via INTRAVENOUS
  Filled 2019-07-19 (×6): qty 80

## 2019-07-19 MED ORDER — MAGNESIUM GLUCONATE 500 MG PO TABS
500.0000 mg | ORAL_TABLET | ORAL | Status: DC
  Administered 2019-07-21: 13:00:00 500 mg via ORAL
  Filled 2019-07-19 (×2): qty 1

## 2019-07-19 MED ORDER — INSULIN ASPART 100 UNIT/ML ~~LOC~~ SOLN: 0.0000 [IU] | Freq: Every day | SUBCUTANEOUS | Status: DC

## 2019-07-19 MED ORDER — ONDANSETRON HCL 4 MG/2ML IJ SOLN: 4.0000 mg | Freq: Four times a day (QID) | INTRAMUSCULAR | Status: DC | PRN

## 2019-07-19 MED ORDER — SODIUM CHLORIDE 0.9% FLUSH
3.0000 mL | Freq: Two times a day (BID) | INTRAVENOUS | Status: DC
  Administered 2019-07-22 – 2019-07-23 (×2): 3 mL via INTRAVENOUS

## 2019-07-19 NOTE — ED Notes (Signed)
Pharmacy contacted to send Protonix drip

## 2019-07-19 NOTE — ED Provider Notes (Signed)
[facility] Emergency Department Provider Note  Time seen: 7:13 PM  I have reviewed the triage vital signs and the nursing notes.   HISTORY  Chief Complaint Abnormal Lab   HPI Joshua Berry is a 73 y.o. male with a past medical history of cirrhosis, diabetes, hypertension, presents in the emergency department for abnormal lab work.   According to the patient for the last several weeks he has been feeling weak, short of breath with exertion, went and saw his doctor had lab work performed was called back today saying the lab work was abnormal, hemoglobin is dropped from 10-6.5.  Patient has noted dark stool over the last several weeks as well.  Denies any abdominal pain or vomiting.  Patient has known cirrhosis but is never received a paracentesis.  Denies any fever cough congestion.  Past Medical History:  Diagnosis Date  . Arthritis   . Cancer (HCC)    melanoma, carcinoma  . Cirrhosis of liver ([place])   . Diabetes mellitus without complication ([place])   . Hypertension   . Spleen enlarged   . Thrombocytopenia [facility])     Patient Active Problem List   Diagnosis Date Noted  . Sepsis ([place]) 12/20/2018    Past Surgical History:  Procedure Laterality Date  . CHOLECYSTECTOMY    . SKIN BIOPSY    . TEE WITHOUT CARDIOVERSION N/A 12/23/2018   Procedure: TRANSESOPHAGEAL ECHOCARDIOGRAM (TEE);  Surgeon: Teodoro Spray, MD;  Location: [facility] ORS;  Service: Cardiovascular;  Laterality: N/A;    Prior to Admission medications   Medication Sig Start Date End Date Taking? Authorizing Provider  acetaminophen (TYLENOL) 325 MG tablet Take 2 tablets (650 mg total) by mouth every 6 (six) hours as needed for mild pain (or Fever >/= 101). Patient not taking: Reported on 01/21/2019 12/25/18   Gouru, Illene Silver, MD  albuterol (PROVENTIL, VENTOLIN) (5 MG/ML) 0.5% NEBU Inhale 2 puffs into the lungs once.    [provider]  atorvastatin (LIPITOR) 10 MG tablet Take 10 mg by mouth daily.     [provider]  B Complex-C (B-COMPLEX WITH VITAMIN C) tablet Take 1 tablet by mouth daily.    [provider]  Cholecalciferol (VITAMIN D3 PO) Take 1 tablet by mouth 2 (two) times daily.    [provider]  guaiFENesin-dextromethorphan (ROBITUSSIN DM) 100-10 MG/5ML syrup Take 5 mLs by mouth every 4 (four) hours as needed for cough (chest congestion). 12/25/18   Nicholes Mango, MD  hydrocortisone (ANUSOL-HC) 2.5 % rectal cream Apply rectally 2 times daily as needed for rectal pain or bleeding 08/24/18 08/24/19  Eula Listen, MD  insulin aspart protamine- aspart (NOVOLOG MIX 70/30) (70-30) 100 UNIT/ML injection Inject 60 Units into the skin 2 (two) times daily with a meal.    [provider]  ipratropium-albuterol (DUONEB) 0.5-2.5 (3) MG/3ML SOLN Take 3 mLs by nebulization every 4 (four) hours as needed.     [provider]  levothyroxine (SYNTHROID, LEVOTHROID) 100 MCG tablet Take 100 mcg by mouth daily before breakfast.    [provider]  loperamide (IMODIUM A-D) 2 MG tablet Take 1 tablet (2 mg total) by mouth 4 (four) times daily as needed for diarrhea or loose stools. Patient not taking: Reported on 01/21/2019 08/24/18   Eula Listen, MD  magnesium gluconate (MAGONATE) 500 MG tablet Take 1 tablet by mouth 3 (three) times a week.    [provider]  metFORMIN (GLUMETZA) 500 MG (MOD) 24 hr tablet Take 500 mg  by mouth at bedtime.    [provider]  omeprazole (PRILOSEC) 20 MG capsule Take 40 mg by mouth daily.     [provider]  ondansetron (ZOFRAN ODT) 4 MG disintegrating tablet Take 1 tablet (4 mg total) by mouth every 8 (eight) hours as needed for nausea or vomiting. Patient not taking: Reported on 01/21/2019 08/24/18   Eula Listen, MD  tamsulosin (FLOMAX) 0.4 MG CAPS capsule Take 1 capsule by mouth daily.    [provider]  terazosin (HYTRIN) 5 MG capsule Take 5 mg by mouth at bedtime.     [provider]  traMADol (ULTRAM) 50 MG tablet Take 1 tablet (50 mg total) by mouth every 12 (twelve) hours as needed for moderate pain. 12/25/18   Gouru, Illene Silver, MD  triamcinolone cream (KENALOG) 0.1 % Apply 1 application topically 3 (three) times daily.    [provider]  zinc sulfate 220 (50 Zn) MG capsule Take 1 capsule by mouth daily.    [provider]    Allergies  Allergen Reactions  . Lisinopril Hives  . Sulfamethoxazole Swelling  . Camphor Rash and Swelling  . Latex Rash  . Nickel Rash    No family history on file.  Social History Social History   Tobacco Use  . Smoking status: Former Research scientist (life sciences)  . Smokeless tobacco: Never Used  Substance Use Topics  . Alcohol use: Not on file  . Drug use: Not on file    Review of Systems Constitutional: Negative for fever. ENT: Negative for recent illness/congestion Cardiovascular: Negative for chest pain. Respiratory: Shortness of breath with exertion. Gastrointestinal: Negative for abdominal pain, vomiting.  Positive for dark stool. Genitourinary: Negative for urinary compaints Musculoskeletal: Negative for musculoskeletal complaints Skin: Negative for skin complaints  Neurological: Negative for headache All other ROS negative  ____________________________________________   PHYSICAL EXAM:  VITAL SIGNS: ED Triage Vitals  Enc Vitals Group     BP 07/19/19 1800 140/80     Pulse --      Resp 07/19/19 1800 16     Temp --      Temp src --      SpO2 --      Weight 07/19/19 1323 264 lb (119.7 kg)     Height 07/19/19 1323 6' 2"  (1.88 m)     Head Circumference --      Peak Flow --      Pain Score 07/19/19 1323 5     Pain Loc --      Pain Edu? --      Excl. in [place]? --    Constitutional: Alert and oriented. Well appearing and in no distress. Eyes: Normal exam ENT      Head: Normocephalic and atraumatic      Mouth/Throat: Mucous membranes are moist. Cardiovascular: Normal rate, regular rhythm.   Respiratory: Normal respiratory effort without tachypnea nor retractions. Breath sounds are clear Gastrointestinal: Nontender, moderate distention with dull percussion consistent both with ascites. Musculoskeletal: Nontender with normal range of motion in all extremities. Neurologic:  Normal speech and language. No gross focal neurologic deficits Skin:  Skin is warm, dry and intact.  Psychiatric: Mood and affect are normal.  ____________________________________________   INITIAL IMPRESSION / ASSESSMENT AND PLAN / ED COURSE  Pertinent labs & imaging results that were available during my care of the patient were reviewed by me and considered in my medical decision making (see chart for details).   Patient presents to the emergency department for low hemoglobin.  Rectal exam shows dark stool, guaiac positive.  Differential at this time would include upper GI bleed due to varices (history of esophageal banding in the past), gastric bleed, peptic ulcer disease, AV malformation.  Given the patient's history of cirrhosis we will start the patient on octreotide, Protonix.  Given the patient's hemoglobin currently 6.3 we will transfuse 1 unit of packed red blood cells.  I discussed the pros and cons with the patient and he has consented.  Patient will require admission to the hospital service for further work-up and treatment as well as GI consultation.  TRESTEN PANTOJA was evaluated in Emergency Department on 07/19/2019 for the symptoms described in the history of present illness. He was evaluated in the context of the global COVID-19 pandemic, which necessitated consideration that the patient might be at risk for infection with the SARS-CoV-2 virus that causes COVID-19. Institutional protocols and algorithms that pertain to the evaluation of patients at risk for COVID-19 are in a state of rapid change based on information released by regulatory bodies including the CDC and federal and state organizations.  These policies and algorithms were followed during the patient's care in the ED.  CRITICAL CARE Performed by: Harvest Dark   Total critical care time: 30 minutes  Critical care time was exclusive of separately billable procedures and treating other patients.  Critical care was necessary to treat or prevent imminent or life-threatening deterioration.  Critical care was time spent personally by me on the following activities: development of treatment plan with patient and/or surrogate as well as nursing, discussions with consultants, evaluation of patient's response to treatment, examination of patient, obtaining history from patient or surrogate, ordering and performing treatments and interventions, ordering and review of laboratory studies, ordering and review of radiographic studies, pulse oximetry and re-evaluation of patient's condition.  ____________________________________________   FINAL CLINICAL IMPRESSION(S) / ED DIAGNOSES  GI bleed Symptomatic anemia   Harvest Dark, MD 07/19/19 Lurena Nida

## 2019-07-19 NOTE — ED Notes (Signed)
Levada Dy, NP made aware of increased abd pressure

## 2019-07-19 NOTE — ED Notes (Signed)
Pt given dinner

## 2019-07-19 NOTE — ED Notes (Signed)
Per pharmacist, run protonix and sandostatin separately

## 2019-07-19 NOTE — H&P (Signed)
[place] at [place] NAME: Joshua Berry    MR#:  [id number]  DATE OF BIRTH:  02-14-1946  DATE OF ADMISSION:  07/19/2019  PRIMARY CARE PHYSICIAN: Lorelei Pont, MD   REQUESTING/REFERRING PHYSICIAN: Harvest Dark, MD  CHIEF COMPLAINT:   Chief Complaint  Patient presents with  . Abnormal Lab    HISTORY OF PRESENT ILLNESS:  Joshua Berry  is a 73 y.o. male with a known history of [place] cirrhosis, diabetes, hypertension, hypothyroidism.  He presented to the emergency room reporting 3 to 4 weeks of increasing fatigue and malaise as well as weakness becoming short of breath with exertion.  He has seen his primary care physician earlier and was called told to follow-up at the emergency room with hemoglobin decreased from 10 and is now 6.5.  Patient reports he has noticed dark and sometimes black stool over the last 6 to 8 weeks.  He denies increasing abdominal pain.  He denies nausea or vomiting.  He denies hematemesis.  Patient is noticing increasing ascites.  However he has had no prior paracentesis procedures.  He has undergone variceal banding in the past.  He denies fever, chills, cough.  He had positive guaiac stool in the emergency room.  Potassium is 3.0 on arrival with hemoglobin 6.3 and hematocrit 20.8.  Platelet is 75.  Patient has a history of thrombocytopenia.  He is receiving 1 unit packed red blood cells currently with initiation of octreotide and Protonix infusions.  We have admitted him to the hospitalist service for further management.  PAST MEDICAL HISTORY:   Past Medical History:  Diagnosis Date  . Arthritis   . Cancer (HCC)    melanoma, carcinoma  . Cirrhosis of liver ([place])   . Diabetes mellitus without complication ([place])   . Hypertension   . Spleen enlarged   . Thrombocytopenia ([place])     PAST SURGICAL HISTORY:   Past Surgical History:  Procedure Laterality Date  . CHOLECYSTECTOMY    . SKIN BIOPSY    . TEE  WITHOUT CARDIOVERSION N/A 12/23/2018   Procedure: TRANSESOPHAGEAL ECHOCARDIOGRAM (TEE);  Surgeon: Teodoro Spray, MD;  Location: [facility] ORS;  Service: Cardiovascular;  Laterality: N/A;    SOCIAL HISTORY:   Social History   Tobacco Use  . Smoking status: Former Research scientist (life sciences)  . Smokeless tobacco: Never Used  Substance Use Topics  . Alcohol use: Not on file    FAMILY HISTORY:  No family history on file.  DRUG ALLERGIES:   Allergies  Allergen Reactions  . Lisinopril Hives  . Sulfamethoxazole Swelling  . Camphor Rash and Swelling  . Latex Rash  . Nickel Rash    REVIEW OF SYSTEMS:   Review of Systems  Constitutional: Positive for malaise/fatigue. Negative for chills and fever.  HENT: Negative for congestion, sinus pain and sore throat.   Eyes: Negative for blurred vision and double vision.  Respiratory: Positive for shortness of breath. Negative for cough, sputum production and wheezing.   Cardiovascular: Negative for chest pain and palpitations.  Gastrointestinal: Positive for blood in stool, melena and nausea. Negative for abdominal pain, constipation, diarrhea, heartburn and vomiting.  Genitourinary: Negative for dysuria, flank pain and hematuria.  Musculoskeletal: Negative for falls and myalgias.  Neurological: Negative for dizziness, loss of consciousness, weakness and headaches.  Psychiatric/Behavioral: Negative for depression.   MEDICATIONS AT HOME:   Prior to Admission medications   Medication Sig Start Date End Date Taking? Authorizing Provider  acetaminophen (TYLENOL) 325 MG  tablet Take 2 tablets (650 mg total) by mouth every 6 (six) hours as needed for mild pain (or Fever >/= 101). Patient not taking: Reported on 01/21/2019 12/25/18   Gouru, Illene Silver, MD  albuterol (PROVENTIL, VENTOLIN) (5 MG/ML) 0.5% NEBU Inhale 2 puffs into the lungs once.    [provider]  atorvastatin (LIPITOR) 10 MG tablet Take 10 mg by mouth daily.    [provider]  B Complex-C  (B-COMPLEX WITH VITAMIN C) tablet Take 1 tablet by mouth daily.    [provider]  Cholecalciferol (VITAMIN D3 PO) Take 1 tablet by mouth 2 (two) times daily.    [provider]  guaiFENesin-dextromethorphan (ROBITUSSIN DM) 100-10 MG/5ML syrup Take 5 mLs by mouth every 4 (four) hours as needed for cough (chest congestion). 12/25/18   Nicholes Mango, MD  hydrocortisone (ANUSOL-HC) 2.5 % rectal cream Apply rectally 2 times daily as needed for rectal pain or bleeding 08/24/18 08/24/19  Eula Listen, MD  insulin aspart protamine- aspart (NOVOLOG MIX 70/30) (70-30) 100 UNIT/ML injection Inject 60 Units into the skin 2 (two) times daily with a meal.    [provider]  ipratropium-albuterol (DUONEB) 0.5-2.5 (3) MG/3ML SOLN Take 3 mLs by nebulization every 4 (four) hours as needed.     [provider]  levothyroxine (SYNTHROID, LEVOTHROID) 100 MCG tablet Take 100 mcg by mouth daily before breakfast.    [provider]  loperamide (IMODIUM A-D) 2 MG tablet Take 1 tablet (2 mg total) by mouth 4 (four) times daily as needed for diarrhea or loose stools. Patient not taking: Reported on 01/21/2019 08/24/18   Eula Listen, MD  magnesium gluconate (MAGONATE) 500 MG tablet Take 1 tablet by mouth 3 (three) times a week.    [provider]  metFORMIN (GLUMETZA) 500 MG (MOD) 24 hr tablet Take 500 mg by mouth at bedtime.    [provider]  omeprazole (PRILOSEC) 20 MG capsule Take 40 mg by mouth daily.     [provider]  ondansetron (ZOFRAN ODT) 4 MG disintegrating tablet Take 1 tablet (4 mg total) by mouth every 8 (eight) hours as needed for nausea or vomiting. Patient not taking: Reported on 01/21/2019 08/24/18   Eula Listen, MD  tamsulosin (FLOMAX) 0.4 MG CAPS capsule Take 1 capsule by mouth daily.    [provider]  terazosin (HYTRIN) 5 MG capsule Take 5 mg by mouth at bedtime.    [provider]  traMADol  (ULTRAM) 50 MG tablet Take 1 tablet (50 mg total) by mouth every 12 (twelve) hours as needed for moderate pain. 12/25/18   Gouru, Illene Silver, MD  triamcinolone cream (KENALOG) 0.1 % Apply 1 application topically 3 (three) times daily.    [provider]  zinc sulfate 220 (50 Zn) MG capsule Take 1 capsule by mouth daily.    [provider]      VITAL SIGNS:  Blood pressure 140/80, resp. rate 16, height 6' 2"  (1.88 m), weight 119.7 kg.  PHYSICAL EXAMINATION:  Physical Exam  GENERAL:  73 y.o.-year-old patient lying in the bed with no acute distress.  EYES: Pupils equal, round, reactive to light and accommodation. No scleral icterus. Extraocular muscles intact.  HEENT: Head atraumatic, normocephalic. Oropharynx and nasopharynx clear.  NECK:  Supple, no jugular venous distention. No thyroid enlargement, no tenderness.  LUNGS: Normal breath sounds bilaterally, no wheezing, rales,rhonchi or crepitation. No use of accessory muscles of respiration.  CARDIOVASCULAR: Regular rate and rhythm, S1, S2 normal. No  murmurs, rubs, or gallops.  ABDOMEN: Distended with ascites, bowel sounds present. No organomegaly or mass.  EXTREMITIES: No pedal edema, cyanosis, or clubbing.  NEUROLOGIC: Cranial nerves II through XII are intact. Muscle strength 5/5 in all extremities. Sensation intact. Gait not checked.  PSYCHIATRIC: The patient is alert and oriented x 3.  Normal affect and good eye contact. SKIN: No obvious rash, lesion, or ulcer.   LABORATORY PANEL:   CBC Recent Labs  Lab 07/19/19 1341  WBC 3.0*  HGB 6.3*  HCT 20.8*  PLT 75*   ------------------------------------------------------------------------------------------------------------------  Chemistries  Recent Labs  Lab 07/19/19 1341  NA 140  K 4.0  CL 108  CO2 25  GLUCOSE 174*  BUN 16  CREATININE 1.11  CALCIUM 8.6*  AST 44*  ALT 30  ALKPHOS 67  BILITOT 0.7    ------------------------------------------------------------------------------------------------------------------  Cardiac Enzymes No results for input(s): TROPONINI in the last 168 hours. ------------------------------------------------------------------------------------------------------------------  RADIOLOGY:  No results found.    IMPRESSION AND PLAN:   1.  GI bleed - Patient receiving 1 unit packed red blood cells -We will repeat H&H 1 hour posttransfusion and continue every 6 hour hemoglobin hematocrit - Octreotide and Protonix infusions - Gastroenterology consulted for further evaluation and recommendations - Abdominal ultrasound pending with appearance of diffuse ascites.  Patient has had no prior paracentesis.  He has history of thrombocytopenia with platelet count 75 today.  2.  Cirrhosis/[place] - Gastroenterology consulted  3. hypokalemia - IV potassium replacement - Repeat BMP in the a.m. -Telemetry monitoring  4.  Thrombocytopenia - Platelet count is 75 which is at patient's baseline -Repeat CBC in the a.m. and continue to monitor platelet count closely  5.  Hypothyroidism - TSH pending -Continue Synthroid  DVT prophylaxis with SCDs and PPI prophylaxis    All the records are reviewed and case discussed with ED provider. The plan of care was discussed in details with the patient (and family). I answered all questions. The patient agreed to proceed with the above mentioned plan. Further management will depend upon hospital course.   CODE STATUS: Full code  TOTAL TIME TAKING CARE OF THIS PATIENT: 45 minutes.    [place] on 07/19/2019 at 7:25 PM  Pager - [phone]  After 6pm go to [email or web address] - Proofreader  [employer] [facility] [place] [facility]  Office  [phone]  CC: Primary care physician; Lorelei Pont, MD   Note: This dictation was prepared with Dragon dictation along with smaller phrase technology. Any  transcriptional errors that result from this process are unintentional.

## 2019-07-19 NOTE — ED Triage Notes (Signed)
Pt reports that he had blood work done this am and his MD at the [place] called him and told him to come to the ED because he may need a blood transfusion.

## 2019-07-20 ENCOUNTER — Inpatient Hospital Stay: Payer: No Typology Code available for payment source | Admitting: Anesthesiology

## 2019-07-20 ENCOUNTER — Inpatient Hospital Stay: Payer: No Typology Code available for payment source

## 2019-07-20 ENCOUNTER — Encounter
Admission: EM | Disposition: A | Discharge status: Home Health | Payer: Self-pay | Source: Home / Self Care | Attending: Internal Medicine

## 2019-07-20 ENCOUNTER — Encounter: Payer: Self-pay | Admitting: *Deleted

## 2019-07-20 DIAGNOSIS — K921 Melena: Secondary | ICD-10-CM

## 2019-07-20 DIAGNOSIS — D649 Anemia, unspecified: Secondary | ICD-10-CM

## 2019-07-20 HISTORY — PX: ESOPHAGOGASTRODUODENOSCOPY: SHX5428

## 2019-07-20 LAB — CBC WITH DIFFERENTIAL/PLATELET
Abs Immature Granulocytes: 0.03 10*3/uL (ref 0.00–0.07)
Basophils Absolute: 0 10*3/uL (ref 0.0–0.1)
Basophils Relative: 1 %
Eosinophils Absolute: 0.1 10*3/uL (ref 0.0–0.5)
Eosinophils Relative: 1 %
HCT: 22.2 % — ABNORMAL LOW (ref 39.0–52.0)
Hemoglobin: 6.8 g/dL — ABNORMAL LOW (ref 13.0–17.0)
Immature Granulocytes: 1 %
Lymphocytes Relative: 8 %
Lymphs Abs: 0.5 10*3/uL — ABNORMAL LOW (ref 0.7–4.0)
MCH: 28.5 pg (ref 26.0–34.0)
MCHC: 30.6 g/dL (ref 30.0–36.0)
MCV: 92.9 fL (ref 80.0–100.0)
Monocytes Absolute: 0.4 10*3/uL (ref 0.1–1.0)
Monocytes Relative: 7 %
Neutro Abs: 5.4 10*3/uL (ref 1.7–7.7)
Neutrophils Relative %: 82 %
Platelets: 70 10*3/uL — ABNORMAL LOW (ref 150–400)
RBC: 2.39 MIL/uL — ABNORMAL LOW (ref 4.22–5.81)
RDW: 18.3 % — ABNORMAL HIGH (ref 11.5–15.5)
WBC: 6.5 10*3/uL (ref 4.0–10.5)
nRBC: 0 % (ref 0.0–0.2)

## 2019-07-20 LAB — URINALYSIS, COMPLETE (UACMP) WITH MICROSCOPIC
Bacteria, UA: NONE SEEN
Bilirubin Urine: NEGATIVE
Glucose, UA: NEGATIVE mg/dL
Hgb urine dipstick: NEGATIVE
Ketones, ur: NEGATIVE mg/dL
Leukocytes,Ua: NEGATIVE
Nitrite: NEGATIVE
Protein, ur: NEGATIVE mg/dL
Specific Gravity, Urine: 1.02 (ref 1.005–1.030)
Squamous Epithelial / HPF: NONE SEEN (ref 0–5)
pH: 5 (ref 5.0–8.0)

## 2019-07-20 LAB — CBC
HCT: 20.6 % — ABNORMAL LOW (ref 39.0–52.0)
Hemoglobin: 6.3 g/dL — ABNORMAL LOW (ref 13.0–17.0)
MCH: 29.2 pg (ref 26.0–34.0)
MCHC: 30.6 g/dL (ref 30.0–36.0)
MCV: 95.4 fL (ref 80.0–100.0)
Platelets: 66 10*3/uL — ABNORMAL LOW (ref 150–400)
RBC: 2.16 MIL/uL — ABNORMAL LOW (ref 4.22–5.81)
RDW: 18 % — ABNORMAL HIGH (ref 11.5–15.5)
WBC: 2.6 10*3/uL — ABNORMAL LOW (ref 4.0–10.5)
nRBC: 0 % (ref 0.0–0.2)

## 2019-07-20 LAB — SARS CORONAVIRUS 2 BY RT PCR (HOSPITAL ORDER, PERFORMED IN ~~LOC~~ HOSPITAL LAB): SARS Coronavirus 2: NEGATIVE

## 2019-07-20 LAB — PROTIME-INR
INR: 1.4 — ABNORMAL HIGH (ref 0.8–1.2)
Prothrombin Time: 16.6 seconds — ABNORMAL HIGH (ref 11.4–15.2)

## 2019-07-20 LAB — HEMOGLOBIN AND HEMATOCRIT, BLOOD
HCT: 23 % — ABNORMAL LOW (ref 39.0–52.0)
HCT: 20.9 % — ABNORMAL LOW (ref 39.0–52.0)
Hemoglobin: 7 g/dL — ABNORMAL LOW (ref 13.0–17.0)
Hemoglobin: 6.3 g/dL — ABNORMAL LOW (ref 13.0–17.0)

## 2019-07-20 LAB — BASIC METABOLIC PANEL
Anion gap: 4 — ABNORMAL LOW (ref 5–15)
BUN: 18 mg/dL (ref 8–23)
CO2: 26 mmol/L (ref 22–32)
Calcium: 8.2 mg/dL — ABNORMAL LOW (ref 8.9–10.3)
Chloride: 111 mmol/L (ref 98–111)
Creatinine, Ser: 1.09 mg/dL (ref 0.61–1.24)
GFR calc Af Amer: >60 mL/min (ref >60)
GFR calc non Af Amer: >60 mL/min (ref >60)
Glucose, Bld: 120 mg/dL — ABNORMAL HIGH (ref 70–99)
Potassium: 4.1 mmol/L (ref 3.5–5.1)
Sodium: 141 mmol/L (ref 135–145)

## 2019-07-20 LAB — IRON AND TIBC
Iron: 23 ug/dL — ABNORMAL LOW (ref 45–182)
Saturation Ratios: 6 % — ABNORMAL LOW (ref 17.9–39.5)
TIBC: 375 ug/dL (ref 250–450)
UIBC: 353 ug/dL

## 2019-07-20 LAB — FERRITIN: Ferritin: 32 ng/mL (ref 24–336)

## 2019-07-20 LAB — TSH: TSH: 1.167 u[IU]/mL (ref 0.350–4.500)

## 2019-07-20 LAB — BRAIN NATRIURETIC PEPTIDE: B Natriuretic Peptide: 117 pg/mL — ABNORMAL HIGH (ref 0.0–100.0)

## 2019-07-20 LAB — PREPARE RBC (CROSSMATCH)

## 2019-07-20 LAB — GLUCOSE, CAPILLARY
Glucose-Capillary: 136 mg/dL — ABNORMAL HIGH (ref 70–99)
Glucose-Capillary: 133 mg/dL — ABNORMAL HIGH (ref 70–99)
Glucose-Capillary: 119 mg/dL — ABNORMAL HIGH (ref 70–99)
Glucose-Capillary: 177 mg/dL — ABNORMAL HIGH (ref 70–99)
Glucose-Capillary: 189 mg/dL — ABNORMAL HIGH (ref 70–99)

## 2019-07-20 LAB — VITAMIN B12: Vitamin B-12: 1010 pg/mL — ABNORMAL HIGH (ref 180–914)

## 2019-07-20 LAB — HEMOGLOBIN A1C
Hgb A1c MFr Bld: 6.1 % — ABNORMAL HIGH (ref 4.8–5.6)
Mean Plasma Glucose: 128.37 mg/dL

## 2019-07-20 LAB — FOLATE: Folate: 35 ng/mL (ref >5.9)

## 2019-07-20 SURGERY — EGD (ESOPHAGOGASTRODUODENOSCOPY)
Anesthesia: General

## 2019-07-20 MED ORDER — SODIUM CHLORIDE 0.9% IV SOLUTION: Freq: Once | INTRAVENOUS | Status: DC

## 2019-07-20 MED ORDER — ACETAMINOPHEN 325 MG PO TABS
650.0000 mg | ORAL_TABLET | Freq: Four times a day (QID) | ORAL | Status: DC | PRN
  Administered 2019-07-20 – 2019-07-21 (×2): 650 mg via ORAL
  Filled 2019-07-20 (×2): qty 2

## 2019-07-20 MED ORDER — EPHEDRINE SULFATE 50 MG/ML IJ SOLN
INTRAMUSCULAR | Status: AC
  Filled 2019-07-20: qty 2

## 2019-07-20 MED ORDER — LIDOCAINE HCL (PF) 2 % IJ SOLN
INTRAMUSCULAR | Status: AC
  Filled 2019-07-20: qty 20

## 2019-07-20 MED ORDER — TAMSULOSIN HCL 0.4 MG PO CAPS
0.4000 mg | ORAL_CAPSULE | Freq: Every day | ORAL | Status: DC
  Administered 2019-07-20 – 2019-07-22 (×3): 0.4 mg via ORAL
  Filled 2019-07-20 (×3): qty 1

## 2019-07-20 MED ORDER — SPIRONOLACTONE 25 MG PO TABS
100.0000 mg | ORAL_TABLET | Freq: Every day | ORAL | Status: DC
  Administered 2019-07-20 – 2019-07-22 (×3): 100 mg via ORAL
  Filled 2019-07-20 (×2): qty 1
  Filled 2019-07-20: qty 4
  Filled 2019-07-20 (×2): qty 1
  Filled 2019-07-20: qty 4

## 2019-07-20 MED ORDER — IOHEXOL 240 MG/ML SOLN: 50.0000 mL | Freq: Once | INTRAMUSCULAR | Status: DC | PRN

## 2019-07-20 MED ORDER — FUROSEMIDE 40 MG PO TABS
40.0000 mg | ORAL_TABLET | Freq: Every day | ORAL | Status: DC
  Administered 2019-07-20 – 2019-07-22 (×3): 40 mg via ORAL
  Filled 2019-07-20 (×3): qty 1

## 2019-07-20 MED ORDER — GLYCOPYRROLATE 0.2 MG/ML IJ SOLN
INTRAMUSCULAR | Status: AC
  Filled 2019-07-20: qty 2

## 2019-07-20 MED ORDER — SODIUM CHLORIDE 0.9 % IV SOLN
2.0000 g | INTRAVENOUS | Status: DC
  Administered 2019-07-20 – 2019-07-23 (×4): 2 g via INTRAVENOUS
  Filled 2019-07-20 (×4): qty 2

## 2019-07-20 MED ORDER — SODIUM CHLORIDE 0.9 % IV SOLN
INTRAVENOUS | Status: DC
  Administered 2019-07-20: 13:00:00 via INTRAVENOUS

## 2019-07-20 MED ORDER — INSULIN ASPART PROT & ASPART (70-30 MIX) 100 UNIT/ML ~~LOC~~ SUSP
55.0000 [IU] | Freq: Two times a day (BID) | SUBCUTANEOUS | Status: DC
  Administered 2019-07-21 – 2019-07-22 (×2): 55 [IU] via SUBCUTANEOUS
  Filled 2019-07-20 (×2): qty 10

## 2019-07-20 MED ORDER — PROPOFOL 10 MG/ML IV BOLUS
INTRAVENOUS | Status: DC | PRN
  Administered 2019-07-20 (×6): 20 mg via INTRAVENOUS

## 2019-07-20 MED ORDER — GLYCOPYRROLATE 0.2 MG/ML IJ SOLN
INTRAMUSCULAR | Status: DC | PRN
  Administered 2019-07-20: 0.2 mg via INTRAVENOUS

## 2019-07-20 MED ORDER — SODIUM CHLORIDE 0.9 % IV SOLN
510.0000 mg | Freq: Once | INTRAVENOUS | Status: AC
  Administered 2019-07-20: 510 mg via INTRAVENOUS
  Filled 2019-07-20: qty 17

## 2019-07-20 MED ORDER — PHENYLEPHRINE HCL (PRESSORS) 10 MG/ML IV SOLN
INTRAVENOUS | Status: DC | PRN
  Administered 2019-07-20 (×2): 100 ug via INTRAVENOUS

## 2019-07-20 MED ORDER — ATORVASTATIN CALCIUM 20 MG PO TABS
10.0000 mg | ORAL_TABLET | Freq: Every day | ORAL | Status: DC
  Administered 2019-07-20 – 2019-07-22 (×3): 10 mg via ORAL
  Filled 2019-07-20 (×3): qty 1

## 2019-07-20 MED ORDER — SODIUM CHLORIDE 0.9 % IV SOLN
2.0000 g | INTRAVENOUS | Status: DC
  Filled 2019-07-20: qty 20

## 2019-07-20 MED ORDER — LIDOCAINE HCL (CARDIAC) PF 100 MG/5ML IV SOSY
PREFILLED_SYRINGE | INTRAVENOUS | Status: DC | PRN
  Administered 2019-07-20: 100 mg via INTRATRACHEAL

## 2019-07-20 NOTE — ED Notes (Signed)
Pharmacy contacted again to obtain protonix

## 2019-07-20 NOTE — Progress Notes (Signed)
[place] at [place] NAME: Joshua Berry    MR#:  [id number]  DATE OF BIRTH:  January 05, 1946  SUBJECTIVE:  CHIEF COMPLAINT:   Chief Complaint  Patient presents with  . Abnormal Lab  Waiting for endoscopy when I saw him, hungry REVIEW OF SYSTEMS:  Review of Systems  Constitutional: Negative for diaphoresis, fever, malaise/fatigue and weight loss.  HENT: Negative for ear discharge, ear pain, hearing loss, nosebleeds, sore throat and tinnitus.   Eyes: Negative for blurred vision and pain.  Respiratory: Negative for cough, hemoptysis, shortness of breath and wheezing.   Cardiovascular: Negative for chest pain, palpitations, orthopnea and leg swelling.  Gastrointestinal: Positive for blood in stool and melena. Negative for abdominal pain, constipation, diarrhea, heartburn, nausea and vomiting.  Genitourinary: Negative for dysuria, frequency and urgency.  Musculoskeletal: Negative for back pain and myalgias.  Skin: Negative for itching and rash.  Neurological: Negative for dizziness, tingling, tremors, focal weakness, seizures, weakness and headaches.  Psychiatric/Behavioral: Negative for depression. The patient is not nervous/anxious.    DRUG ALLERGIES:   Allergies  Allergen Reactions  . Lisinopril Hives  . Sulfamethoxazole Swelling  . Camphor Rash and Swelling  . Latex Rash  . Nickel Rash   VITALS:  Blood pressure 112/63, pulse 84, temperature (!) 97 F (36.1 C), temperature source Tympanic, resp. rate (!) 22, height 6' 2"  (1.88 m), weight 117 kg, SpO2 95 %. PHYSICAL EXAMINATION:  Physical Exam HENT:     Head: Normocephalic and atraumatic.  Eyes:     Conjunctiva/sclera: Conjunctivae normal.     Pupils: Pupils are equal, round, and reactive to light.  Neck:     Musculoskeletal: Normal range of motion and neck supple.     Thyroid: No thyromegaly.     Trachea: No tracheal deviation.  Cardiovascular:     Rate and Rhythm:  Normal rate and regular rhythm.     Heart sounds: Normal heart sounds.  Pulmonary:     Effort: Pulmonary effort is normal. No respiratory distress.     Breath sounds: Normal breath sounds. No wheezing.  Chest:     Chest wall: No tenderness.  Abdominal:     General: Bowel sounds are normal. There is distension.     Palpations: Abdomen is soft. There is fluid wave.     Tenderness: There is no abdominal tenderness.  Musculoskeletal: Normal range of motion.  Skin:    General: Skin is warm and dry.     Findings: No rash.  Neurological:     Mental Status: He is alert and oriented to person, place, and time.     Cranial Nerves: No cranial nerve deficit.    LABORATORY PANEL:  Male CBC Recent Labs  Lab 07/20/19 0520 07/20/19 0940  WBC 2.6*  --   HGB 6.3* 7.0*  HCT 20.6* 23.0*  PLT 66*  --    ------------------------------------------------------------------------------------------------------------------ Chemistries  Recent Labs  Lab 07/19/19 1341 07/20/19 0520  NA 140 141  K 4.0 4.1  CL 108 111  CO2 25 26  GLUCOSE 174* 120*  BUN 16 18  CREATININE 1.11 1.09  CALCIUM 8.6* 8.2*  AST 44*  --   ALT 30  --   ALKPHOS 67  --   BILITOT 0.7  --    RADIOLOGY:  US Abdomen Limited  Result Date: 07/20/2019 CLINICAL DATA:  Abdominal distention. EXAM: ULTRASOUND ABDOMEN LIMITED COMPARISON:  12/21/2018. FINDINGS: Small amount of ascites  noted. Largest pocket is in the right lower quadrant. IMPRESSION: Small amount of ascites. Electronically Signed   By: Marcello Moores  Register   On: 07/20/2019 08:13   ASSESSMENT AND PLAN:  Joshua Berry is a 73 y.o. male with metabolic syndrome, hypothyroidism, [name] cirrhosis admitted with melena, symptomatic anemia, ascites  1. 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 - Monitor CBC closely, maintain hemoglobin greater than 7, platelets greater than 50  2. Cirrhosis of liver/[place]SH with ascites -Continue Aldactone and Lasix diuresis Strict adherence to low-sodium diet Ultrasound in 12/2018 did not reveal any liver lesions Follow-up with gastroenterologist at the [facility] after discharge - Abdominal ultrasound pending with appearance of diffuse ascites.  Patient has had no prior paracentesis  3. hypokalemia -  Replete and recheck  4.  Thrombocytopenia - Platelet count is 75 which is at patient's baseline -Repeat CBC in the a.m. and continue to monitor platelet count closely  5.  Hypothyroidism - TSH normal -Continue Synthroid     All the records are reviewed and case discussed with Care Management/Social Worker. Management plans discussed with the patient, nursing and they are in agreement.  CODE STATUS: Full Code  TOTAL TIME TAKING CARE OF THIS PATIENT: 35 minutes.   More than 50% of the time was spent in counseling/coordination of care: YES  POSSIBLE D/C IN 1-2 DAYS, DEPENDING ON CLINICAL CONDITION.   Max Sane M.D on 07/20/2019 at 2:30 PM  Between 7am to 6pm - Pager - [phone]  After 6pm go to [email or web address] - Proofreader  [employer] [facility] [place] [facility]  Office  [phone]  CC: Primary care physician; Lorelei Pont, MD  Note: This dictation was prepared with Dragon dictation along with smaller phrase technology. Any transcriptional errors that result from this process are unintentional.

## 2019-07-20 NOTE — ED Notes (Signed)
PT transferred to hospital bed

## 2019-07-20 NOTE — Progress Notes (Addendum)
He's spiking fever now and Hb still low.  I've consulted ID and requested GI to see him again to see. UA neg, fever could be due to SBP - already on rocephin, will order 2 sets of blood c/s  Also noted KUB - partial sbo? Will make him NPO for now

## 2019-07-20 NOTE — Anesthesia Post-op Follow-up Note (Signed)
Anesthesia QCDR form completed.        

## 2019-07-20 NOTE — Transfer of Care (Addendum)
Immediate Anesthesia Transfer of Care Note  [name]  Procedure(s) Performed: ESOPHAGOGASTRODUODENOSCOPY (EGD) (N/A )  Patient Location: Endoscopy Unit  Anesthesia Type:General  Level of Consciousness: awake, alert  and patient cooperative  Airway & Oxygen Therapy: Patient Spontanous Breathing and Patient connected to face mask oxygen  Post-op Assessment: Report given to RN and Post -op Vital signs reviewed and stable  Post vital signs: Reviewed and stable  Last Vitals:  Vitals Value Taken Time  BP  07/20/19 1315  Temp    Pulse 93 07/20/19 1315  Resp 18 07/20/19 1315  SpO2 96 % 07/20/19 1315  Vitals shown include unvalidated device data.  Last Pain:  Vitals:   07/20/19 1314  TempSrc: (P) Tympanic  PainSc:          Complications: No apparent anesthesia complications

## 2019-07-20 NOTE — ED Notes (Signed)
ED TO INPATIENT HANDOFF REPORT  ED Nurse Name and Phone #: [place]  S Name/Age/Gender Joshua Berry 73 y.o. male Room/Bed: ED36A/ED36A  Code Status   Code Status: Full Code  Home/SNF/Other Home Patient oriented to: self, place, time and situation Is this baseline? Yes   Triage Complete: Triage complete  Chief Complaint Blood Transfusion  Triage Note Pt reports that he had blood work done this am and his MD at the [place] called him and told him to come to the ED because he may need a blood transfusion.    Allergies Allergies  Allergen Reactions  . Lisinopril Hives  . Sulfamethoxazole Swelling  . Camphor Rash and Swelling  . Latex Rash  . Nickel Rash    Level of Care/Admitting Diagnosis ED Disposition    ED Disposition Condition [facility] Area: [place] [[id number]]  Level of Care: Med-Surg [16]  Covid Evaluation: Asymptomatic Screening Protocol (No Symptoms)  Diagnosis: GI bleed [024097]  Admitting Physician: Mayer Camel [[id number]]  Attending Physician: Mayer Camel [[id number]]  Estimated length of stay: past midnight tomorrow  Certification:: I certify this patient will need inpatient services for at least 2 midnights  PT Class (Do Not Modify): Inpatient [101]  PT Acc Code (Do Not Modify): Private [1]       B Medical/Surgery History Past Medical History:  Diagnosis Date  . Arthritis   . Cancer (HCC)    melanoma, carcinoma  . Cirrhosis of liver ([place])   . Diabetes mellitus without complication ([place])   . Hypertension   . Spleen enlarged   . Thrombocytopenia ([place])    Past Surgical History:  Procedure Laterality Date  . CHOLECYSTECTOMY    . SKIN BIOPSY    . TEE WITHOUT CARDIOVERSION N/A 12/23/2018   Procedure: TRANSESOPHAGEAL ECHOCARDIOGRAM (TEE);  Surgeon: Teodoro Spray, MD;  Location: [facility] ORS;  Service: Cardiovascular;  Laterality: N/A;     A IV Location/Drains/Wounds Patient Lines/Drains/Airways  Status   Active Line/Drains/Airways    Name:   Placement date:   Placement time:   Site:   Days:   Peripheral IV 07/19/19 Left Forearm   07/19/19    1500    Forearm   1   Peripheral IV 07/19/19 Left Hand   07/19/19    2241    Hand   1   PICC Single Lumen 12/25/18 PICC Left Brachial 50 cm 0 cm   12/25/18    1454    Brachial   207          Intake/Output Last 24 hours  Intake/Output Summary (Last 24 hours) at 07/20/2019 0835 Last data filed at 07/20/2019 0538 Gross per 24 hour  Intake 960 ml  Output 375 ml  Net 585 ml    Labs/Imaging Results for orders placed or performed during the hospital encounter of 07/19/19 (from the past 48 hour(s))  Comprehensive metabolic panel     Status: Abnormal   Collection Time: 07/19/19  1:41 PM  Result Value Ref Range   Sodium 140 135 - 145 mmol/L   Potassium 4.0 3.5 - 5.1 mmol/L   Chloride 108 98 - 111 mmol/L   CO2 25 22 - 32 mmol/L   Glucose, Bld 174 (H) 70 - 99 mg/dL   BUN 16 8 - 23 mg/dL   Creatinine, Ser 1.11 0.61 - 1.24 mg/dL   Calcium 8.6 (L) 8.9 - 10.3 mg/dL   Total Protein 6.6 6.5 - 8.1 g/dL  Albumin 3.2 (L) 3.5 - 5.0 g/dL   AST 44 (H) 15 - 41 U/L   ALT 30 0 - 44 U/L   Alkaline Phosphatase 67 38 - 126 U/L   Total Bilirubin 0.7 0.3 - 1.2 mg/dL   GFR calc non Af Amer >60 >60 mL/min   GFR calc Af Amer >60 >60 mL/min   Anion gap 7 5 - 15    Comment: Performed at [facility], [address] [place], [place] [address]  CBC     Status: Abnormal   Collection Time: 07/19/19  1:41 PM  Result Value Ref Range   WBC 3.0 (L) 4.0 - 10.5 K/uL   RBC 2.16 (L) 4.22 - 5.81 MIL/uL   Hemoglobin 6.3 (L) 13.0 - 17.0 g/dL   HCT 20.8 (L) 39.0 - 52.0 %   MCV 96.3 80.0 - 100.0 fL   MCH 29.2 26.0 - 34.0 pg   MCHC 30.3 30.0 - 36.0 g/dL   RDW 17.2 (H) 11.5 - 15.5 %   Platelets 75 (L) 150 - 400 K/uL    Comment: Immature Platelet Fraction may be clinically indicated, consider ordering this additional test [id number]    nRBC 0.0 0.0 - 0.2 %     Comment: Performed at [facility], [address] [place], [place] [address]  Sample to Blood Bank     Status: None   Collection Time: 07/19/19  1:41 PM  Result Value Ref Range   Blood Bank Specimen SAMPLE AVAILABLE FOR TESTING    Sample Expiration      07/22/2019,2359 Performed at [facility], [address] [place], [place] [address]   Type and screen     Status: None   Collection Time: 07/19/19  1:41 PM  Result Value Ref Range   ABO/RH(D) O NEG    Antibody Screen NEG    Sample Expiration 07/22/2019,2359    Unit Number [id number]    Blood Component Type RBC, LR IRR    Unit division 00    Status of Unit ISSUED,FINAL    Transfusion Status OK TO TRANSFUSE    Crossmatch Result      Compatible Performed at [facility], [address] [place], [place] [address]   Prepare RBC     Status: None   Collection Time: 07/19/19  6:06 PM  Result Value Ref Range   Order Confirmation      ORDER PROCESSED BY BLOOD BANK Performed at [facility], [address], [place], [place] [address]   ABO/Rh     Status: None   Collection Time: 07/19/19  6:06 PM  Result Value Ref Range   ABO/RH(D)      Jenetta Downer NEG Performed at [facility], [address] [place], [place] [address]   SARS Coronavirus 2 [facility] order, Performed in [facility] hospital lab) Nasopharyngeal Nasopharyngeal Swab     Status: None   Collection Time: 07/20/19 12:09 AM   Specimen: Nasopharyngeal Swab  Result Value Ref Range   SARS Coronavirus 2 NEGATIVE NEGATIVE    Comment: (NOTE) If result is NEGATIVE SARS-CoV-2 target nucleic acids are NOT DETECTED. The SARS-CoV-2 RNA is generally detectable in upper and lower  respiratory specimens during the acute phase of infection. The lowest  concentration of SARS-CoV-2 viral copies this assay can detect is 250  copies / mL. A negative result does not preclude SARS-CoV-2 infection  and should not be used as the sole basis for  treatment or other  patient  management decisions.  A negative result may occur with  improper specimen collection / handling, submission of specimen other  than nasopharyngeal swab, presence of viral mutation(s) within the  areas targeted by this assay, and inadequate number of viral copies  (<250 copies / mL). A negative result must be combined with clinical  observations, patient history, and epidemiological information. If result is POSITIVE SARS-CoV-2 target nucleic acids are DETECTED. The SARS-CoV-2 RNA is generally detectable in upper and lower  respiratory specimens dur ing the acute phase of infection.  Positive  results are indicative of active infection with SARS-CoV-2.  Clinical  correlation with patient history and other diagnostic information is  necessary to determine patient infection status.  Positive results do  not rule out bacterial infection or co-infection with other viruses. If result is PRESUMPTIVE POSTIVE SARS-CoV-2 nucleic acids MAY BE PRESENT.   A presumptive positive result was obtained on the submitted specimen  and confirmed on repeat testing.  While 2019 novel coronavirus  (SARS-CoV-2) nucleic acids may be present in the submitted sample  additional confirmatory testing may be necessary for epidemiological  and / or clinical management purposes  to differentiate between  SARS-CoV-2 and other Sarbecovirus currently known to infect humans.  If clinically indicated additional testing with an alternate test  methodology [phone]) is advised. The SARS-CoV-2 RNA is generally  detectable in upper and lower respiratory sp ecimens during the acute  phase of infection. The expected result is Negative. Fact Sheet for Patients:  StrictlyIdeas.no Fact Sheet for Healthcare Providers: BankingDealers.co.za This test is not yet approved or cleared by the [place] FDA and has been authorized for detection and/or  diagnosis of SARS-CoV-2 by FDA under an Emergency Use Authorization (EUA).  This EUA will remain in effect (meaning this test can be used) for the duration of the COVID-19 declaration under Section 564(b)(1) of the Act, 21 U.S.C. section 360bbb-3(b)(1), unless the authorization is terminated or revoked sooner. Performed at [facility], [address] [place], [place] [address]   Urinalysis, Complete w Microscopic     Status: Abnormal   Collection Time: 07/20/19  1:10 AM  Result Value Ref Range   Color, Urine AMBER (A) YELLOW    Comment: BIOCHEMICALS MAY BE AFFECTED BY COLOR   APPearance CLOUDY (A) CLEAR   Specific Gravity, Urine 1.020 1.005 - 1.030   pH 5.0 5.0 - 8.0   Glucose, UA NEGATIVE NEGATIVE mg/dL   Hgb urine dipstick NEGATIVE NEGATIVE   Bilirubin Urine NEGATIVE NEGATIVE   Ketones, ur NEGATIVE NEGATIVE mg/dL   Protein, ur NEGATIVE NEGATIVE mg/dL   Nitrite NEGATIVE NEGATIVE   Leukocytes,Ua NEGATIVE NEGATIVE   RBC / HPF 0-5 0 - 5 RBC/hpf   WBC, UA 0-5 0 - 5 WBC/hpf   Bacteria, UA NONE SEEN NONE SEEN   Squamous Epithelial / LPF NONE SEEN 0 - 5   Mucus PRESENT    Hyaline Casts, UA PRESENT     Comment: Performed at [facility], [address] [place], [place] [address]  Glucose, capillary     Status: Abnormal   Collection Time: 07/20/19  1:45 AM  Result Value Ref Range   Glucose-Capillary 136 (H) 70 - 99 mg/dL  Brain natriuretic peptide     Status: Abnormal   Collection Time: 07/20/19  5:20 AM  Result Value Ref Range   B Natriuretic Peptide 117.0 (H) 0.0 - 100.0 pg/mL    Comment: Performed at [facility], [place], [place]  [address]  Basic metabolic panel     Status: Abnormal   Collection Time: 07/20/19  5:20 AM  Result Value Ref Range   Sodium 141 135 - 145 mmol/L   Potassium 4.1 3.5 - 5.1 mmol/L   Chloride 111 98 - 111 mmol/L   CO2 26 22 - 32 mmol/L   Glucose, Bld 120 (H) 70 - 99 mg/dL   BUN 18 8 - 23 mg/dL    Creatinine, Ser 1.09 0.61 - 1.24 mg/dL   Calcium 8.2 (L) 8.9 - 10.3 mg/dL   GFR calc non Af Amer >60 >60 mL/min   GFR calc Af Amer >60 >60 mL/min   Anion gap 4 (L) 5 - 15    Comment: Performed at [facility], [address] [place], [place] [address]  CBC     Status: Abnormal   Collection Time: 07/20/19  5:20 AM  Result Value Ref Range   WBC 2.6 (L) 4.0 - 10.5 K/uL   RBC 2.16 (L) 4.22 - 5.81 MIL/uL   Hemoglobin 6.3 (L) 13.0 - 17.0 g/dL   HCT 20.6 (L) 39.0 - 52.0 %   MCV 95.4 80.0 - 100.0 fL   MCH 29.2 26.0 - 34.0 pg   MCHC 30.6 30.0 - 36.0 g/dL   RDW 18.0 (H) 11.5 - 15.5 %   Platelets 66 (L) 150 - 400 K/uL    Comment: Immature Platelet Fraction may be clinically indicated, consider ordering this additional test [id number] CONSISTENT WITH PREVIOUS RESULT    nRBC 0.0 0.0 - 0.2 %    Comment: Performed at [facility], [address] [place], [place] [address]  Protime-INR     Status: Abnormal   Collection Time: 07/20/19  5:20 AM  Result Value Ref Range   Prothrombin Time 16.6 (H) 11.4 - 15.2 seconds   INR 1.4 (H) 0.8 - 1.2    Comment: (NOTE) INR goal varies based on device and disease states. Performed at [facility], [address] [place], [place] [address]   TSH     Status: None   Collection Time: 07/20/19  5:20 AM  Result Value Ref Range   TSH 1.167 0.350 - 4.500 uIU/mL    Comment: Performed by a 3rd Generation assay with a functional sensitivity of <=0.01 uIU/mL. Performed at [facility], [address] [place], [place] [address]   Glucose, capillary     Status: Abnormal   Collection Time: 07/20/19  8:24 AM  Result Value Ref Range   Glucose-Capillary 133 (H) 70 - 99 mg/dL   US Abdomen Limited  Result Date: 07/20/2019 CLINICAL DATA:  Abdominal distention. EXAM: ULTRASOUND ABDOMEN LIMITED COMPARISON:  12/21/2018. FINDINGS: Small amount of ascites noted. Largest pocket is in the right lower quadrant. IMPRESSION: Small amount  of ascites. Electronically Signed   By: Marcello Moores  Register   On: 07/20/2019 08:13    Pending Labs Unresulted Labs (From admission, onward)    Start     Ordered   07/20/19 0535  Hemoglobin A1c  Once,   R     07/20/19 0535   07/20/19 0000  Hemoglobin and hematocrit, blood  Now then every 6 hours,   STAT     07/19/19 1924          Vitals/Pain Today's Vitals   07/20/19 0349 07/20/19 0519 07/20/19 0715 07/20/19 0750  BP:  (Abnormal) 118/59 128/82 121/65  Pulse:  65 80 62  Resp:  17 18   Temp:  TempSrc:      SpO2:  96% 99% 99%  Weight:      Height:      PainSc: 0-No pain       Isolation Precautions No active isolations  Medications Medications  octreotide (SANDOSTATIN) 500 mcg in sodium chloride 0.9 % 250 mL (2 mcg/mL) infusion (50 mcg/hr Intravenous New Bag/Given 07/19/19 1849)  pantoprazole (PROTONIX) 80 mg in sodium chloride 0.9 % 250 mL (0.32 mg/mL) infusion (8 mg/hr Intravenous New Bag/Given 07/20/19 0834)  atorvastatin (LIPITOR) tablet 10 mg (has no administration in time range)  B-complex with vitamin C tablet 1 tablet (has no administration in time range)  insulin aspart protamine- aspart (NOVOLOG MIX 70/30) injection 60 Units (60 Units Subcutaneous Not Given 07/20/19 0831)  ipratropium-albuterol (DUONEB) 0.5-2.5 (3) MG/3ML nebulizer solution 3 mL (3 mLs Nebulization Given 07/20/19 0005)  levothyroxine (SYNTHROID) tablet 100 mcg (has no administration in time range)  magnesium gluconate (MAGONATE) tablet 500 mg (has no administration in time range)  tamsulosin (FLOMAX) capsule 0.4 mg (has no administration in time range)  zinc sulfate capsule 220 mg (has no administration in time range)  sodium chloride flush (NS) 0.9 % injection 3 mL (3 mLs Intravenous Not Given 07/20/19 0147)  ondansetron (ZOFRAN) tablet 4 mg (has no administration in time range)    Or  ondansetron (ZOFRAN) injection 4 mg (has no administration in time range)  potassium chloride 10 mEq in 100 mL IVPB (10  mEq Intravenous Not Given 07/20/19 0101)  insulin aspart (novoLOG) injection 0-15 Units (0 Units Subcutaneous Not Given 07/20/19 0831)  insulin aspart (novoLOG) injection 0-5 Units (0 Units Subcutaneous Not Given 07/20/19 0146)  sodium chloride flush (NS) 0.9 % injection 3 mL (3 mLs Intravenous Given 07/19/19 1943)  0.9 %  sodium chloride infusion (0 mL/hr Intravenous Stopped 07/19/19 2249)  pantoprazole (PROTONIX) 80 mg in sodium chloride 0.9 % 100 mL IVPB (0 mg Intravenous Stopped 07/20/19 0249)    Mobility walks Low fall risk   Focused Assessments Cardiac Assessment Handoff:  Cardiac Rhythm: Sinus bradycardia No results found for: CKTOTAL, CKMB, CKMBINDEX, TROPONINI No results found for: DDIMER Does the Patient currently have chest pain? No      R Recommendations: See Admitting Provider Note  Report given to:   Additional Notes: 0

## 2019-07-20 NOTE — Consult Note (Signed)
Joshua Darby, MD [address]  [place]  [place], [place] [address]  Main: [phone]  Fax: [phone] Pager: [phone]   Consultation  Referring Provider:     No ref. provider found Primary Care Physician:  Joshua Pont, MD Primary Gastroenterologist: [facility]        Reason for Consultation: melena, anemia, cirrhosis  Date of Admission:  07/19/2019 Date of Consultation:  07/20/2019         HPI:   Joshua Berry is a 73 y.o. male with h/o NASH cirrhosis, decompensated with ascites  Admitted with several weeks h/o intermittent melena, weakness, exertional dyspnea, abdominal distension.  He had labs done and found to have hemoglobin of 6.3, therefore sent to ER for blood transfusion.  He is kept n.p.o., started on pantoprazole, octreotide drips.  His vitals are stable on arrival.  Patient has been having a follow-up with gastroenterology at the [facility] and reports having had several endoscopies since his diagnosis of cirrhosis and underwent ligation of the varices.  He also underwent colonoscopy in the past.  He told me that he is scheduled to undergo upper endoscopy as well as colonoscopy in October at the [place].  He did not see his GI in last few months Berry to pandemic.  When I saw the patient, he was lying comfortable, he received 1 unit of PRBCs and he had black stools around 9:30 AM today.  He denies drinking alcohol.  He does report swelling of legs and abdominal distention which is new.  He reports being treated his ascites and volume overload in the past with diuretics and no longer on it.  He denies abdominal pain, fever, chills nausea or vomiting.  NSAIDs: None  Antiplts/Anticoagulants/Anti thrombotics: None  GI Procedures: EGD and colonoscopy in the past at the [place]  Past Medical History:  Diagnosis Date  . Arthritis   . Cancer (HCC)    melanoma, carcinoma  . Cirrhosis of liver ([place])   . Diabetes mellitus without complication ([place])   . Hypertension   . Spleen  enlarged   . Thrombocytopenia ([place])     Past Surgical History:  Procedure Laterality Date  . CHOLECYSTECTOMY    . SKIN BIOPSY    . TEE WITHOUT CARDIOVERSION N/A 12/23/2018   Procedure: TRANSESOPHAGEAL ECHOCARDIOGRAM (TEE);  Surgeon: Joshua Spray, MD;  Location: [facility] ORS;  Service: Cardiovascular;  Laterality: N/A;    Prior to Admission medications   Medication Sig Start Date End Date Taking? Authorizing Provider  Alogliptin Benzoate (NESINA) 12.5 MG TABS Take 12.5 mg by mouth daily.   Yes [provider]  atorvastatin (LIPITOR) 10 MG tablet Take 10 mg by mouth daily.   Yes [provider]  B Complex-C (B-COMPLEX WITH VITAMIN C) tablet Take 1 tablet by mouth daily.   Yes [provider]  cholecalciferol (VITAMIN D) 25 MCG (1000 UT) tablet Take 1,000 Units by mouth 2 (two) times daily.    Yes [provider]  insulin aspart protamine- aspart (NOVOLOG MIX 70/30) (70-30) 100 UNIT/ML injection Inject 55 Units into the skin 2 (two) times daily with a meal.    Yes [provider]  ipratropium-albuterol (DUONEB) 0.5-2.5 (3) MG/3ML SOLN Take 3 mLs by nebulization every 4 (four) hours as needed (shortness of breath).    Yes [provider]  levothyroxine (SYNTHROID, LEVOTHROID) 100 MCG tablet Take 100 mcg by mouth daily before breakfast.   Yes [provider]  magnesium gluconate (MAGONATE) 500  MG tablet Take 500 mg by mouth 3 (three) times a week.    Yes [provider]  omeprazole (PRILOSEC) 20 MG capsule Take 40 mg by mouth daily.    Yes [provider]  tamsulosin (FLOMAX) 0.4 MG CAPS capsule Take 0.4 mg by mouth daily.    Yes [provider]  terazosin (HYTRIN) 5 MG capsule Take 5 mg by mouth at bedtime.   Yes [provider]  traMADol (ULTRAM) 50 MG tablet Take 1 tablet (50 mg total) by mouth every 12 (twelve) hours as needed for moderate pain. 12/25/18  Yes Gouru, Illene Silver, MD  triamcinolone cream  (KENALOG) 0.1 % Apply 1 application topically 3 (three) times daily as needed (skin conditions).    Yes [provider]  zinc sulfate 220 (50 Zn) MG capsule Take 1 capsule by mouth daily.   Yes [provider]  acetaminophen (TYLENOL) 325 MG tablet Take 2 tablets (650 mg total) by mouth every 6 (six) hours as needed for mild pain (or Fever >/= 101). Patient not taking: Reported on 01/21/2019 12/25/18   Joshua Mango, MD  guaiFENesin-dextromethorphan (ROBITUSSIN DM) 100-10 MG/5ML syrup Take 5 mLs by mouth every 4 (four) hours as needed for cough (chest congestion). Patient not taking: Reported on 07/20/2019 12/25/18   Joshua Mango, MD  hydrocortisone (ANUSOL-HC) 2.5 % rectal cream Apply rectally 2 times daily as needed for rectal pain or bleeding Patient not taking: Reported on 07/20/2019 08/24/18 08/24/19  Joshua Listen, MD  loperamide (IMODIUM A-D) 2 MG tablet Take 1 tablet (2 mg total) by mouth 4 (four) times daily as needed for diarrhea or loose stools. Patient not taking: Reported on 01/21/2019 08/24/18   Joshua Listen, MD  ondansetron (ZOFRAN ODT) 4 MG disintegrating tablet Take 1 tablet (4 mg total) by mouth every 8 (eight) hours as needed for nausea or vomiting. Patient not taking: Reported on 01/21/2019 08/24/18   Joshua Listen, MD    Current Facility-Administered Medications:  .  0.9 %  sodium chloride infusion, , Intravenous, Continuous, Vanga, Joshua Due, MD, Last Rate: 20 mL/hr at 07/20/19 1237 .  [MAR Hold] atorvastatin (LIPITOR) tablet 10 mg, 10 mg, Oral, Daily, Seals, Joshua Dills, NP .  [MAR Hold] B-complex with vitamin C tablet 1 tablet, 1 tablet, Oral, Daily, Seals, Joshua Dills, NP .  Doug Sou Hold] cefTRIAXone (ROCEPHIN) 2 g in sodium chloride 0.9 % 100 mL IVPB, 2 g, Intravenous, Q24H, Vanga, Joshua Due, MD .  Doug Sou Hold] insulin aspart (novoLOG) injection 0-15 Units, 0-15 Units, Subcutaneous, TID WC, Seals, Joshua Dills, NP .  Doug Sou Hold] insulin aspart (novoLOG)  injection 0-5 Units, 0-5 Units, Subcutaneous, QHS, Seals, Joshua Dills, NP .  Doug Sou Hold] insulin aspart protamine- aspart (NOVOLOG MIX 70/30) injection 60 Units, 60 Units, Subcutaneous, BID WC, Seals, Joshua Dills, NP .  [MAR Hold] ipratropium-albuterol (DUONEB) 0.5-2.5 (3) MG/3ML nebulizer solution 3 mL, 3 mL, Nebulization, Q6H PRN, Seals, Angela H, NP, 3 mL at 07/20/19 0005 .  [MAR Hold] levothyroxine (SYNTHROID) tablet 100 mcg, 100 mcg, Oral, QAC breakfast, Seals, Joshua Dills, NP .  Doug Sou Hold] magnesium gluconate (MAGONATE) tablet 500 mg, 500 mg, Oral, Once per day on Mon Wed Fri, Seals, Angela H, NP .  octreotide (SANDOSTATIN) 500 mcg in sodium chloride 0.9 % 250 mL (2 mcg/mL) infusion, 50 mcg/hr, Intravenous, Continuous, Harvest Dark, MD, Last Rate: 25 mL/hr at 07/20/19 0849, 50 mcg/hr at 07/20/19 0849 .  [MAR Hold] ondansetron (ZOFRAN) tablet 4 mg, 4 mg, Oral, Q6H PRN **  OR** [MAR Hold] ondansetron (ZOFRAN) injection 4 mg, 4 mg, Intravenous, Q6H PRN, Seals, Angela H, NP .  pantoprazole (PROTONIX) 80 mg in sodium chloride 0.9 % 250 mL (0.32 mg/mL) infusion, 8 mg/hr, Intravenous, Continuous, Paduchowski, Lennette Bihari, MD, Last Rate: 25 mL/hr at 07/20/19 0834, 8 mg/hr at 07/20/19 0834 .  [MAR Hold] sodium chloride flush (NS) 0.9 % injection 3 mL, 3 mL, Intravenous, Q12H, Seals, Joshua Dills, NP .  [MAR Hold] tamsulosin (FLOMAX) capsule 0.4 mg, 0.4 mg, Oral, Daily, Seals, Joshua Dills, NP .  Doug Sou Hold] zinc sulfate capsule 220 mg, 220 mg, Oral, Daily, Seals, Joshua Dills, NP  History reviewed. No pertinent family history.   Social History   Tobacco Use  . Smoking status: Former Research scientist (life sciences)  . Smokeless tobacco: Never Used  Substance Use Topics  . Alcohol use: Not Currently  . Drug use: Not Currently    Allergies as of 07/19/2019 - Review Complete 07/19/2019  Allergen Reaction Noted  . Lisinopril Hives 09/09/2017  . Sulfamethoxazole Swelling 09/16/2012  . Camphor Rash and Swelling 11/17/2012  . Latex Rash 09/16/2012   . Nickel Rash 11/17/2012    Review of Systems:    All systems reviewed and negative except where noted in HPI.   Physical Exam:  Vital signs in last 24 hours: Temp:  [97 F (36.1 C)-98.7 F (37.1 C)] 97 F (36.1 C) (08/04 1222) Pulse Rate:  [56-80] 70 (08/04 1222) Resp:  [12-22] 18 (08/04 1222) BP: (112-140)/(58-82) 140/70 (08/04 1222) SpO2:  [92 %-99 %] 97 % (08/04 1222) Weight:  [117 kg-119.7 kg] 117 kg (08/04 1222) Last BM Date: 07/20/19   General:   Pleasant, cooperative in NAD Head:  Normocephalic and atraumatic. Eyes:   No icterus.   Conjunctiva pale. PERRLA. Ears:  Normal auditory acuity. Neck:  Supple; no masses or thyroidomegaly Lungs: Respirations even and unlabored. Lungs clear to auscultation bilaterally.   No wheezes, crackles, or rhonchi.  Heart:  Regular rate and rhythm;  Without murmur, clicks, rubs or gallops Abdomen:  Soft, distended, tympanic in the central abdomen, dull in bilateral flanks, nontender. Normal bowel sounds. No appreciable masses or hepatomegaly.  No rebound or guarding.  Rectal:  Not performed. Msk:  Symmetrical without gross deformities.  Strength normal Extremities:  Without edema, cyanosis or clubbing. Neurologic:  Alert and oriented x3;  grossly normal neurologically. Skin:  Intact without significant lesions or rashes. Psych:  Alert and cooperative. Normal affect.  LAB RESULTS: CBC Latest Ref Rng & Units 07/20/2019 07/20/2019 07/19/2019  WBC 4.0 - 10.5 K/uL - 2.6(L) 3.0(L)  Hemoglobin 13.0 - 17.0 g/dL 7.0(L) 6.3(L) 6.3(L)  Hematocrit 39.0 - 52.0 % 23.0(L) 20.6(L) 20.8(L)  Platelets 150 - 400 K/uL - 66(L) 75(L)    BMET BMP Latest Ref Rng & Units 07/20/2019 07/19/2019 12/24/2018  Glucose 70 - 99 mg/dL 120(H) 174(H) 138(H)  BUN 8 - 23 mg/dL 18 16 12   Creatinine 0.61 - 1.24 mg/dL 1.09 1.11 0.81  Sodium 135 - 145 mmol/L 141 140 135  Potassium 3.5 - 5.1 mmol/L 4.1 4.0 3.5  Chloride 98 - 111 mmol/L 111 108 104  CO2 22 - 32 mmol/L 26 25 23    Calcium 8.9 - 10.3 mg/dL 8.2(L) 8.6(L) 8.4(L)    LFT Hepatic Function Latest Ref Rng & Units 07/19/2019 12/20/2018 12/19/2018  Total Protein 6.5 - 8.1 g/dL 6.6 7.3 8.0  Albumin 3.5 - 5.0 g/dL 3.2(L) 3.7 3.9  AST 15 - 41 U/L 44(H) 60(H) 42(H)  ALT 0 - 44 U/L  30 36 29  Alk Phosphatase 38 - 126 U/L 67 74 76  Total Bilirubin 0.3 - 1.2 mg/dL 0.7 2.0(H) 1.2     STUDIES: US Abdomen Limited  Result Date: 07/20/2019 CLINICAL DATA:  Abdominal distention. EXAM: ULTRASOUND ABDOMEN LIMITED COMPARISON:  12/21/2018. FINDINGS: Small amount of ascites noted. Largest pocket is in the right lower quadrant. IMPRESSION: Small amount of ascites. Electronically Signed   By: Marcello Moores  Register   On: 07/20/2019 08:13      Impression / Plan:   Joshua Berry is a 73 y.o. 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 Continue pantoprazole, octreotide drips Ceftriaxone for SBP prophylaxis Recommend EGD today and patient is agreeable Monitor CBC closely, maintain hemoglobin greater than 7, platelets greater than 50  I have discussed alternative options, risks & benefits,  which include, but are not limited to, bleeding, infection, perforation,respiratory complication & drug reaction.  The patient agrees with this plan & written consent will be obtained.    Cirrhosis of liver with ascites Recommend to start diuretics at low-dose Strict adherence to low-sodium diet Ultrasound in 12/2018 did not reveal any liver lesions Follow-up with gastroenterologist at the [facility] after discharge  Thank you for involving me in the care of this patient.  We will follow along with you    LOS: 1 day   Sherri Sear, MD  07/20/2019, 12:50 PM   Note: This dictation was prepared with Dragon dictation along with smaller phrase technology. Any transcriptional errors that result from this process are  unintentional.

## 2019-07-20 NOTE — Anesthesia Preprocedure Evaluation (Signed)
Anesthesia Evaluation  Patient identified by MRN, date of birth, ID band Patient awake    Reviewed: Allergy & Precautions, H&P , NPO status , Patient's Chart, lab work & pertinent test results, reviewed documented beta blocker date and time   Airway Mallampati: II   Neck ROM: full    Dental  (+) Poor Dentition   Pulmonary neg pulmonary ROS, former smoker,    Pulmonary exam normal        Cardiovascular hypertension, negative cardio ROS Normal cardiovascular exam Rhythm:regular Rate:Normal     Neuro/Psych negative neurological ROS  negative psych ROS   GI/Hepatic negative GI ROS, Neg liver ROS,   Endo/Other  negative endocrine ROSdiabetes  Renal/GU negative Renal ROS  negative genitourinary   Musculoskeletal   Abdominal   Peds  Hematology negative hematology ROS (+)   Anesthesia Other Findings Past Medical History: No date: Arthritis No date: Cancer ([place])     Comment:  melanoma, carcinoma No date: Cirrhosis of liver (HCC) No date: Diabetes mellitus without complication (HCC) No date: Hypertension No date: Spleen enlarged No date: Thrombocytopenia ([place]) Past Surgical History: No date: CHOLECYSTECTOMY No date: SKIN BIOPSY 12/23/2018: TEE WITHOUT CARDIOVERSION; N/A     Comment:  Procedure: TRANSESOPHAGEAL ECHOCARDIOGRAM (TEE);                Surgeon: Teodoro Spray, MD;  Location: [facility] ORS;                Service: Cardiovascular;  Laterality: N/A; BMI    Body Mass Index: 33.90 kg/m     Reproductive/Obstetrics negative OB ROS                             Anesthesia Physical Anesthesia Plan  ASA: III  Anesthesia Plan: General   Post-op Pain Management:    Induction:   PONV Risk Score and Plan:   Airway Management Planned:   Additional Equipment:   Intra-op Plan:   Post-operative Plan:   Informed Consent: I have reviewed the patients History and Physical, chart, labs  and discussed the procedure including the risks, benefits and alternatives for the proposed anesthesia with the patient or authorized representative who has indicated his/her understanding and acceptance.     Dental Advisory Given  Plan Discussed with: CRNA  Anesthesia Plan Comments:         Anesthesia Quick Evaluation

## 2019-07-20 NOTE — Anesthesia Postprocedure Evaluation (Signed)
Anesthesia Post Note  [name]  Procedure(s) Performed: ESOPHAGOGASTRODUODENOSCOPY (EGD) (N/A )  Patient location during evaluation: PACU Anesthesia Type: General Level of consciousness: awake and alert Pain management: pain level controlled Vital Signs Assessment: post-procedure vital signs reviewed and stable Respiratory status: spontaneous breathing, nonlabored ventilation and respiratory function stable Cardiovascular status: blood pressure returned to baseline and stable Postop Assessment: no apparent nausea or vomiting Anesthetic complications: no     Last Vitals:  Vitals:   07/20/19 1344 07/20/19 1354  BP: 105/61 112/63  Pulse: 86 84  Resp: 18 (!) 22  Temp:    SpO2: 92% 95%    Last Pain:  Vitals:   07/20/19 1354  TempSrc:   PainSc: 0-No pain                 Durenda Hurt

## 2019-07-20 NOTE — Progress Notes (Signed)
Pt running a fever prior to blood administration, per MD Posey Pronto, okay to give tylenol and and start blood transfusion. Will continue to monitor.

## 2019-07-20 NOTE — Op Note (Addendum)
[facility] Patient Name: Joshua Berry Procedure Date: 07/20/2019 12:34 PM MRN: [id number] Account #: [id number] Date of Birth: January 01, 1946 Admit Type: Outpatient Age: 73 Room: [facility] ENDO ROOM 1 Gender: Male Note Status: Finalized Procedure:            Upper GI endoscopy Indications:          Iron deficiency anemia secondary to chronic blood loss,                        Melena, Cirrhosis with UGI bleeding suspected                        esophageal varices Providers:            Lin Landsman MD, MD Medicines:            Monitored Anesthesia Care Complications:        No immediate complications. Estimated blood loss: None. Procedure:            Pre-Anesthesia Assessment:                       - Prior to the procedure, a History and Physical was                        performed, and patient medications and allergies were                        reviewed. The patient is competent. The risks and                        benefits of the procedure and the sedation options and                        risks were discussed with the patient. All questions                        were answered and informed consent was obtained.                        Patient identification and proposed procedure were                        verified by the physician, the nurse, the                        anesthesiologist, the anesthetist and the technician in                        the pre-procedure area in the procedure room in the                        endoscopy suite. Mental Status Examination: alert and                        oriented. Airway Examination: normal oropharyngeal                        airway and neck mobility. Respiratory Examination:  clear to auscultation. CV Examination: normal.                        Prophylactic Antibiotics: The patient does not require                        prophylactic antibiotics. Prior Anticoagulants: The                         patient has taken no previous anticoagulant or                        antiplatelet agents. ASA Grade Assessment: III - A                        patient with severe systemic disease. After reviewing                        the risks and benefits, the patient was deemed in                        satisfactory condition to undergo the procedure. The                        anesthesia plan was to use monitored anesthesia care                        (MAC). Immediately prior to administration of                        medications, the patient was re-assessed for adequacy                        to receive sedatives. The heart rate, respiratory rate,                        oxygen saturations, blood pressure, adequacy of                        pulmonary ventilation, and response to care were                        monitored throughout the procedure. The physical status                        of the patient was re-assessed after the procedure.                       After obtaining informed consent, the endoscope was                        passed under direct vision. Throughout the procedure,                        the patient's blood pressure, pulse, and oxygen                        saturations were monitored continuously. The Endoscope  was introduced through the mouth, and advanced to the                        second part of duodenum. The upper GI endoscopy was                        accomplished without difficulty. The patient tolerated                        the procedure well. Findings:      A single 5 mm angioectasia without bleeding was found in the second       portion of the duodenum. Coagulation for hemostasis using argon plasma       was successful.      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 Coagulation for hemostasis using argon plasma was successful.       Estimated blood loss: none.      The cardia and  gastric fundus were normal on retroflexion.      Three columns of non-bleeding large (> 5 mm) varices were found in the       middle third of the esophagus and in the lower third of the esophagus,.       No stigmata of recent bleeding were evident and no red wale signs were       present. Scarring from prior treatment was visible. Evidence of partial       eradication was visible. Impression:           - A single non-bleeding angioectasia in the duodenum.                        Treated with argon plasma coagulation (APC).                       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                        Treated with argon plasma coagulation (APC).                       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                       - No specimens collected. Recommendation:       - Return patient to hospital ward for ongoing care.                       - Low sodium diet today.                       - Continue present medications. Procedure Code(s):    --- Professional ---                       [phone], Esophagogastroduodenoscopy, flexible, transoral;                        with control of bleeding, any method Diagnosis Code(s):    --- Professional ---                       K74.60, Unspecified  cirrhosis of liver                       I85.10, Secondary esophageal varices without bleeding                       K31.811, Angiodysplasia of stomach and duodenum with                        bleeding                       D50.0, Iron deficiency anemia secondary to blood loss                        (chronic)                       K92.2, Gastrointestinal hemorrhage, unspecified                       K92.1, Melena (includes Hematochezia) CPT copyright 2019 American Medical Association. All rights reserved. The codes documented in this report are preliminary and upon coder review may  be revised to meet current compliance requirements. Dr. Ulyess Mort Lin Landsman MD,  MD 07/20/2019 1:16:31 PM This report has been signed electronically. Number of Addenda: 0 Note Initiated On: 07/20/2019 12:34 PM Estimated Blood Loss: Estimated blood loss: none.      [facility]

## 2019-07-21 ENCOUNTER — Inpatient Hospital Stay: Payer: No Typology Code available for payment source

## 2019-07-21 DIAGNOSIS — R188 Other ascites: Secondary | ICD-10-CM

## 2019-07-21 DIAGNOSIS — Z8619 Personal history of other infectious and parasitic diseases: Secondary | ICD-10-CM

## 2019-07-21 DIAGNOSIS — N179 Acute kidney failure, unspecified: Secondary | ICD-10-CM

## 2019-07-21 DIAGNOSIS — K31819 Angiodysplasia of stomach and duodenum without bleeding: Secondary | ICD-10-CM

## 2019-07-21 DIAGNOSIS — Z87891 Personal history of nicotine dependence: Secondary | ICD-10-CM

## 2019-07-21 DIAGNOSIS — Z91040 Latex allergy status: Secondary | ICD-10-CM

## 2019-07-21 DIAGNOSIS — Z96612 Presence of left artificial shoulder joint: Secondary | ICD-10-CM

## 2019-07-21 DIAGNOSIS — R509 Fever, unspecified: Secondary | ICD-10-CM

## 2019-07-21 DIAGNOSIS — Z9889 Other specified postprocedural states: Secondary | ICD-10-CM

## 2019-07-21 DIAGNOSIS — Z888 Allergy status to other drugs, medicaments and biological substances status: Secondary | ICD-10-CM

## 2019-07-21 DIAGNOSIS — M25511 Pain in right shoulder: Secondary | ICD-10-CM

## 2019-07-21 DIAGNOSIS — R011 Cardiac murmur, unspecified: Secondary | ICD-10-CM

## 2019-07-21 DIAGNOSIS — K7581 Nonalcoholic steatohepatitis (NASH): Secondary | ICD-10-CM

## 2019-07-21 DIAGNOSIS — Z91048 Other nonmedicinal substance allergy status: Secondary | ICD-10-CM

## 2019-07-21 DIAGNOSIS — Z8739 Personal history of other diseases of the musculoskeletal system and connective tissue: Secondary | ICD-10-CM

## 2019-07-21 DIAGNOSIS — K7460 Unspecified cirrhosis of liver: Secondary | ICD-10-CM

## 2019-07-21 DIAGNOSIS — K766 Portal hypertension: Secondary | ICD-10-CM

## 2019-07-21 DIAGNOSIS — K922 Gastrointestinal hemorrhage, unspecified: Secondary | ICD-10-CM

## 2019-07-21 DIAGNOSIS — M170 Bilateral primary osteoarthritis of knee: Secondary | ICD-10-CM

## 2019-07-21 DIAGNOSIS — D500 Iron deficiency anemia secondary to blood loss (chronic): Secondary | ICD-10-CM

## 2019-07-21 DIAGNOSIS — K7290 Hepatic failure, unspecified without coma: Secondary | ICD-10-CM

## 2019-07-21 DIAGNOSIS — D696 Thrombocytopenia, unspecified: Secondary | ICD-10-CM

## 2019-07-21 DIAGNOSIS — M1990 Unspecified osteoarthritis, unspecified site: Secondary | ICD-10-CM

## 2019-07-21 DIAGNOSIS — Z881 Allergy status to other antibiotic agents status: Secondary | ICD-10-CM

## 2019-07-21 DIAGNOSIS — E119 Type 2 diabetes mellitus without complications: Secondary | ICD-10-CM

## 2019-07-21 LAB — BODY FLUID CELL COUNT WITH DIFFERENTIAL
Lymphs, Fluid: 22 %
Monocyte-Macrophage-Serous Fluid: 76 %
Neutrophil Count, Fluid: 2 %
Total Nucleated Cell Count, Fluid: 180 cu mm

## 2019-07-21 LAB — BASIC METABOLIC PANEL
Anion gap: 8 (ref 5–15)
BUN: 16 mg/dL (ref 8–23)
CO2: 26 mmol/L (ref 22–32)
Calcium: 8.2 mg/dL — ABNORMAL LOW (ref 8.9–10.3)
Chloride: 105 mmol/L (ref 98–111)
Creatinine, Ser: 1.27 mg/dL — ABNORMAL HIGH (ref 0.61–1.24)
GFR calc Af Amer: >60 mL/min (ref >60)
GFR calc non Af Amer: 55 mL/min — ABNORMAL LOW (ref >60)
Glucose, Bld: 153 mg/dL — ABNORMAL HIGH (ref 70–99)
Potassium: 3.9 mmol/L (ref 3.5–5.1)
Sodium: 139 mmol/L (ref 135–145)

## 2019-07-21 LAB — CBC
HCT: 23.7 % — ABNORMAL LOW (ref 39.0–52.0)
Hemoglobin: 7.4 g/dL — ABNORMAL LOW (ref 13.0–17.0)
MCH: 28.7 pg (ref 26.0–34.0)
MCHC: 31.2 g/dL (ref 30.0–36.0)
MCV: 91.9 fL (ref 80.0–100.0)
Platelets: 68 10*3/uL — ABNORMAL LOW (ref 150–400)
RBC: 2.58 MIL/uL — ABNORMAL LOW (ref 4.22–5.81)
RDW: 18.4 % — ABNORMAL HIGH (ref 11.5–15.5)
WBC: 6.2 10*3/uL (ref 4.0–10.5)
nRBC: 0 % (ref 0.0–0.2)

## 2019-07-21 LAB — AMYLASE, PLEURAL OR PERITONEAL FLUID: Amylase, Fluid: 19 U/L

## 2019-07-21 LAB — LACTATE DEHYDROGENASE, PLEURAL OR PERITONEAL FLUID: LD, Fluid: 40 U/L — ABNORMAL HIGH (ref 3–23)

## 2019-07-21 LAB — PROTEIN, PLEURAL OR PERITONEAL FLUID: Total protein, fluid: <3 g/dL

## 2019-07-21 LAB — GLUCOSE, CAPILLARY
Glucose-Capillary: 141 mg/dL — ABNORMAL HIGH (ref 70–99)
Glucose-Capillary: 145 mg/dL — ABNORMAL HIGH (ref 70–99)
Glucose-Capillary: 169 mg/dL — ABNORMAL HIGH (ref 70–99)
Glucose-Capillary: 114 mg/dL — ABNORMAL HIGH (ref 70–99)

## 2019-07-21 LAB — GLUCOSE, PLEURAL OR PERITONEAL FLUID: Glucose, Fluid: 155 mg/dL

## 2019-07-21 LAB — ALBUMIN, PLEURAL OR PERITONEAL FLUID: Albumin, Fluid: <1 g/dL

## 2019-07-21 LAB — PREPARE RBC (CROSSMATCH)

## 2019-07-21 LAB — BILIRUBIN, TOTAL: Total Bilirubin: 1.6 mg/dL — ABNORMAL HIGH (ref 0.3–1.2)

## 2019-07-21 MED ORDER — IOHEXOL 300 MG/ML  SOLN
100.0000 mL | Freq: Once | INTRAMUSCULAR | Status: AC | PRN
  Administered 2019-07-21: 100 mL via INTRAVENOUS

## 2019-07-21 MED ORDER — SODIUM CHLORIDE 0.9 % IV SOLN
300.0000 mg | Freq: Once | INTRAVENOUS | Status: AC
  Administered 2019-07-22: 01:00:00 300 mg via INTRAVENOUS
  Filled 2019-07-21: qty 15

## 2019-07-21 MED ORDER — SALINE SPRAY 0.65 % NA SOLN
1.0000 | NASAL | Status: DC | PRN
  Administered 2019-07-21: 1 via NASAL
  Filled 2019-07-21: qty 44

## 2019-07-21 MED ORDER — SODIUM CHLORIDE 0.9% IV SOLUTION
Freq: Once | INTRAVENOUS | Status: AC
  Administered 2019-07-21: 03:00:00 via INTRAVENOUS

## 2019-07-21 NOTE — Consult Note (Signed)
Patient ID: Joshua Berry, male   DOB: 08-01-1946, 73 y.o.   MRN: [id number]  HPI Joshua Berry is a 73 y.o. male seen in consultation at the request of Dr. Manuella Ghazi for ileus.  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  He reports some overall weakness and some melena.  Apparently he was told to come to the emergency room after hemoglobin was 6.  Since then he was admitted and has been transfused a unit of red blood cell.  Patient denies any abdominal pain.  He did have a fever   yesterday.  No fevers before and no chills.  He reports no vomiting no nausea.  He is passing gas and actually is having regular bowel movements.  He underwent an EGD that I have personally reviewed showing some telangiectasias that were controlled with argon beam by Dr. Marius Ditch.  I have personally reviewed the CT scan showing evidence of massive ascites, cirrhosis and splenomegaly consistent with portal hypertension there is very mild small bowel dilation.  There is no evidence of closed-loop obstruction or free air.Marland Kitchen  His hemoglobin today is 7.4 that was an appropriate response to blood.  Platelets is 68,000 white count is 6.  LFTs are normal and albumin is 3.2. Creeat 1.27   HPI  Past Medical History:  Diagnosis Date  . Arthritis   . Cancer (HCC)    melanoma, carcinoma  . Cirrhosis of liver ([place])   . Diabetes mellitus without complication ([place])   . Hypertension   . Spleen enlarged   . Thrombocytopenia ([place])     Past Surgical History:  Procedure Laterality Date  . CHOLECYSTECTOMY    . ESOPHAGOGASTRODUODENOSCOPY N/A 07/20/2019   Procedure: ESOPHAGOGASTRODUODENOSCOPY (EGD);  Surgeon: Lin Landsman, MD;  Location: [facility] ENDOSCOPY;  Service: Gastroenterology;  Laterality: N/A;  . SKIN BIOPSY    . TEE WITHOUT CARDIOVERSION N/A 12/23/2018   Procedure: TRANSESOPHAGEAL ECHOCARDIOGRAM (TEE);  Surgeon: Teodoro Spray, MD;  Location: [facility] ORS;  Service:  Cardiovascular;  Laterality: N/A;    History reviewed. No pertinent family history.  Social History Social History   Tobacco Use  . Smoking status: Former Research scientist (life sciences)  . Smokeless tobacco: Never Used  Substance Use Topics  . Alcohol use: Not Currently  . Drug use: Not Currently    Allergies  Allergen Reactions  . Lisinopril Hives  . Sulfamethoxazole Swelling  . Camphor Rash and Swelling  . Latex Rash  . Nickel Rash    Current Facility-Administered Medications  Medication Dose Route Frequency Provider Last Rate Last Dose  . 0.9 %  sodium chloride infusion (Manually program via Guardrails IV Fluids)   Intravenous Once Max Sane, MD   Stopped at 07/21/19 0327  . acetaminophen (TYLENOL) tablet 650 mg  650 mg Oral Q6H PRN Fritzi Mandes, MD   650 mg at 07/20/19 1906  . atorvastatin (LIPITOR) tablet 10 mg  10 mg Oral Daily Max Sane, MD   10 mg at 07/20/19 2153  . B-complex with vitamin C tablet 1 tablet  1 tablet Oral Daily Seals, Theo Dills, NP   1 tablet at 07/20/19 1559  . cefTRIAXone (ROCEPHIN) 2 g in sodium chloride 0.9 % 100 mL IVPB  2 g Intravenous Q24H Nazari, Walid A, RPH 200 mL/hr at 07/20/19 1500 2 g at 07/20/19 1500  . furosemide (LASIX) tablet 40 mg  40 mg Oral Daily Vanga, Tally Due, MD   40 mg at  07/20/19 1605  . insulin aspart (novoLOG) injection 0-15 Units  0-15 Units Subcutaneous TID WC Seals, Theo Dills, NP   2 Units at 07/21/19 0818  . insulin aspart (novoLOG) injection 0-5 Units  0-5 Units Subcutaneous QHS Seals, Angela H, NP      . insulin aspart protamine- aspart (NOVOLOG MIX 70/30) injection 55 Units  55 Units Subcutaneous BID WC Shah, Vipul, MD      . iohexol (OMNIPAQUE) 240 MG/ML injection 50 mL  50 mL Oral Once PRN Vanga, Tally Due, MD      . ipratropium-albuterol (DUONEB) 0.5-2.5 (3) MG/3ML nebulizer solution 3 mL  3 mL Nebulization Q6H PRN Seals, Angela H, NP   3 mL at 07/20/19 1605  . levothyroxine (SYNTHROID) tablet 100 mcg  100 mcg Oral QAC breakfast  Seals, Levada Dy H, NP      . magnesium gluconate (MAGONATE) tablet 500 mg  500 mg Oral Once per day on Mon Wed Fri Seals, Angela H, NP      . octreotide (SANDOSTATIN) 500 mcg in sodium chloride 0.9 % 250 mL (2 mcg/mL) infusion  50 mcg/hr Intravenous Continuous Harvest Dark, MD 25 mL/hr at 07/20/19 1911 50 mcg/hr at 07/20/19 1911  . ondansetron (ZOFRAN) tablet 4 mg  4 mg Oral Q6H PRN Seals, Theo Dills, NP       Or  . ondansetron (ZOFRAN) injection 4 mg  4 mg Intravenous Q6H PRN Seals, Theo Dills, NP      . pantoprazole (PROTONIX) 80 mg in sodium chloride 0.9 % 250 mL (0.32 mg/mL) infusion  8 mg/hr Intravenous Continuous Harvest Dark, MD 25 mL/hr at 07/20/19 2352 8 mg/hr at 07/20/19 2352  . sodium chloride (OCEAN) 0.65 % nasal spray 1 spray  1 spray Each Nare PRN Manuella Ghazi, Vipul, MD      . sodium chloride flush (NS) 0.9 % injection 3 mL  3 mL Intravenous Q12H Seals, Angela H, NP      . spironolactone (ALDACTONE) tablet 100 mg  100 mg Oral Daily Lin Landsman, MD   100 mg at 07/20/19 1558  . tamsulosin (FLOMAX) capsule 0.4 mg  0.4 mg Oral Daily Max Sane, MD   0.4 mg at 07/20/19 2153  . zinc sulfate capsule 220 mg  220 mg Oral Daily Seals, Theo Dills, NP   220 mg at 07/20/19 1559     Review of Systems Full ROS  was asked and was negative except for the information on the HPI  Physical Exam Blood pressure (!) 111/59, pulse 74, temperature 98.4 F (36.9 C), temperature source Oral, resp. rate 17, height 6' 2"  (1.88 m), weight 117 kg, SpO2 96 %. CONSTITUTIONAL: Obese,  NAD EYES: Pupils are equal, round, and reactive to light, Sclera are non-icteric. EARS, NOSE, MOUTH AND THROAT: The oropharynx is clear. The oral mucosa is pink and moist. Hearing is intact to voice. LYMPH NODES:  Lymph nodes in the neck are normal. RESPIRATORY:  Lungs are clear. There is normal respiratory effort, with equal breath sounds bilaterally, and without pathologic use of accessory muscles. CARDIOVASCULAR: Heart  is regular without murmurs, gallops, or rubs. GI: The abdomen is  soft, nontender, Massive ascites. There are no palpable masses.  There are normal bowel sounds in all quadrants. GU: Rectal deferred.   MUSCULOSKELETAL: Normal muscle strength and tone. No cyanosis or edema.   SKIN: Turgor is good and there are no pathologic skin lesions or ulcers. NEUROLOGIC: Motor and sensation is grossly normal. Cranial nerves are grossly intact. PSYCH:  Oriented to person, place and time. Affect is normal.  Data Reviewed  I have personally reviewed the patient's imaging, laboratory findings and medical records.    Assessment/Plan 73 year old male with history of [name]  cirrhosis child C classification, thrombocytopenia now presents with GI bleed.  Ileus likely related to GI bleed and questionable spontaneous bacterial peritonitis.  Currently abdomen is soft and there are no signs of obstruction.  May do clear liquid diet for now.  Encouraged continue work-up by GI.  No surgical indication at this time.  He is an extremely high risk for any perioperative morbidity and mortality related to an abdominal operation.  Discussed with the patient in detail and with primary team.  Currently there is no need for any surgical intervention and we will be available for any questions or concerns. I will order a repeat INR as well as a CMP for tomorrow to follow his liver function.  Caroleen Hamman, MD FACS General Surgeon 07/21/2019, 10:28 AM

## 2019-07-21 NOTE — Progress Notes (Signed)
[name], MD [address]  [place]  [place], [place] [address]  Main: [phone]  Fax: [phone] Pager: [phone]   Subjective: Patient had a febrile episode last night associated with worsening of abdominal pain and distention.  He underwent x-ray KUB which revealed possible partial SBO.  Subsequently underwent CT abdomen and pelvis with contrast which revealed possible partial SBO, severe ascites.  Today, patient reports feeling significantly better with regards to his abdominal pain and distention after paracentesis.  3 L of amber-colored fluid was removed.  He has been afebrile today.  He is tolerating liquid diet well.  His stools are dark greenish in color, he is passing gas.  He received blood transfusion overnight and his hemoglobin improved to 7.4 this morning.  He received Lasix and he reports he is urinating well   Objective: Vital signs in last 24 hours: Vitals:   07/21/19 0636 07/21/19 1333 07/21/19 1426 07/21/19 1541  BP: (!) 111/59 125/70 133/69 112/61  Pulse: 74 71 71 64  Resp: 17   16  Temp: 98.4 F (36.9 C)   98.3 F (36.8 C)  TempSrc: Oral   Oral  SpO2: 96% 97% 96% 98%  Weight:      Height:       Weight change: -2.722 kg  Intake/Output Summary (Last 24 hours) at 07/21/2019 1758 Last data filed at 07/21/2019 1558 Gross per 24 hour  Intake 2860.06 ml  Output 1600 ml  Net 1260.06 ml     Exam: Heart:: Regular rate and rhythm or S1S2 present Lungs: normal and clear to auscultation Abdomen: Soft, nontender, moderately distended   Lab Results: CBC Latest Ref Rng & Units 07/21/2019 07/20/2019 07/20/2019  WBC 4.0 - 10.5 K/uL 6.2 6.5 -  Hemoglobin 13.0 - 17.0 g/dL 7.4(L) 6.8(L) 6.3(L)  Hematocrit 39.0 - 52.0 % 23.7(L) 22.2(L) 20.9(L)  Platelets 150 - 400 K/uL 68(L) 70(L) -   CMP Latest Ref Rng & Units 07/21/2019 07/20/2019 07/19/2019  Glucose 70 - 99 mg/dL 153(H) 120(H) 174(H)  BUN 8 - 23 mg/dL 16 18 16   Creatinine 0.61 - 1.24 mg/dL 1.27(H)  1.09 1.11  Sodium 135 - 145 mmol/L 139 141 140  Potassium 3.5 - 5.1 mmol/L 3.9 4.1 4.0  Chloride 98 - 111 mmol/L 105 111 108  CO2 22 - 32 mmol/L 26 26 25   Calcium 8.9 - 10.3 mg/dL 8.2(L) 8.2(L) 8.6(L)  Total Protein 6.5 - 8.1 g/dL - - 6.6  Total Bilirubin 0.3 - 1.2 mg/dL 1.6(H) - 0.7  Alkaline Phos 38 - 126 U/L - - 67  AST 15 - 41 U/L - - 44(H)  ALT 0 - 44 U/L - - 30    Micro Results: Recent Results (from the past 240 hour(s))  SARS Coronavirus 2 [facility] order, Performed in [facility] hospital lab) Nasopharyngeal Nasopharyngeal Swab     Status: None   Collection Time: 07/20/19 12:09 AM   Specimen: Nasopharyngeal Swab  Result Value Ref Range Status   SARS Coronavirus 2 NEGATIVE NEGATIVE Final    Comment: (NOTE) If result is NEGATIVE SARS-CoV-2 target nucleic acids are NOT DETECTED. The SARS-CoV-2 RNA is generally detectable in upper and lower  respiratory specimens during the acute phase of infection. The lowest  concentration of SARS-CoV-2 viral copies this assay can detect is 250  copies / mL. A negative result does not preclude SARS-CoV-2 infection  and should not be used as the sole basis for treatment or other  patient management decisions.  A negative result may occur with  improper specimen collection / handling, submission of specimen other  than nasopharyngeal swab, presence of viral mutation(s) within the  areas targeted by this assay, and inadequate number of viral copies  (<250 copies / mL). A negative result must be combined with clinical  observations, patient history, and epidemiological information. If result is POSITIVE SARS-CoV-2 target nucleic acids are DETECTED. The SARS-CoV-2 RNA is generally detectable in upper and lower  respiratory specimens dur ing the acute phase of infection.  Positive  results are indicative of active infection with SARS-CoV-2.  Clinical  correlation with patient history and other diagnostic information is  necessary to determine  patient infection status.  Positive results do  not rule out bacterial infection or co-infection with other viruses. If result is PRESUMPTIVE POSTIVE SARS-CoV-2 nucleic acids MAY BE PRESENT.   A presumptive positive result was obtained on the submitted specimen  and confirmed on repeat testing.  While 2019 novel coronavirus  (SARS-CoV-2) nucleic acids may be present in the submitted sample  additional confirmatory testing may be necessary for epidemiological  and / or clinical management purposes  to differentiate between  SARS-CoV-2 and other Sarbecovirus currently known to infect humans.  If clinically indicated additional testing with an alternate test  methodology [phone]) is advised. The SARS-CoV-2 RNA is generally  detectable in upper and lower respiratory sp ecimens during the acute  phase of infection. The expected result is Negative. Fact Sheet for Patients:  StrictlyIdeas.no Fact Sheet for Healthcare Providers: BankingDealers.co.za This test is not yet approved or cleared by the [place] FDA and has been authorized for detection and/or diagnosis of SARS-CoV-2 by FDA under an Emergency Use Authorization (EUA).  This EUA will remain in effect (meaning this test can be used) for the duration of the COVID-19 declaration under Section 564(b)(1) of the Act, 21 U.S.C. section 360bbb-3(b)(1), unless the authorization is terminated or revoked sooner. Performed at [facility], [address] [place], [place] [address]   CULTURE, BLOOD (ROUTINE X 2) w Reflex to ID Panel     Status: None (Preliminary result)   Collection Time: 07/20/19 10:34 PM   Specimen: BLOOD  Result Value Ref Range Status   Specimen Description BLOOD RIGHT ASSIST CONTROL  Final   Special Requests   Final    BOTTLES DRAWN AEROBIC AND ANAEROBIC Blood Culture adequate volume   Culture   Final    NO GROWTH < 12 HOURS Performed at [facility], [address] [place], [place] [address]    Report Status PENDING  Incomplete  CULTURE, BLOOD (ROUTINE X 2) w Reflex to ID Panel     Status: None (Preliminary result)   Collection Time: 07/20/19 10:42 PM   Specimen: BLOOD  Result Value Ref Range Status   Specimen Description BLOOD RIGHT ARM  Final   Special Requests   Final    BOTTLES DRAWN AEROBIC AND ANAEROBIC Blood Culture results may not be optimal due to an excessive volume of blood received in culture bottles   Culture   Final    NO GROWTH < 12 HOURS Performed at [facility], [address] [place], [place] [address]    Report Status PENDING  Incomplete   Studies/Results: Dg Abd 1 View  Result Date: 07/20/2019 CLINICAL DATA:  Abdominal distension EXAM: ABDOMEN - 1 VIEW COMPARISON:  Ultrasound 07/20/2019 FINDINGS: Visible lung bases are clear. Scattered colon gas is present. Dilated small bowel measuring up to 5.7 cm.  IMPRESSION: Dilated central small bowel with scattered colon gas. Findings could be secondary to developing or partial small bowel obstruction. Electronically Signed   By: Donavan Foil M.D.   On: 07/20/2019 18:04   Ct Abdomen Pelvis W Contrast  Result Date: 07/21/2019 CLINICAL DATA:  Abdominal pain, distention. EXAM: CT ABDOMEN AND PELVIS WITH CONTRAST TECHNIQUE: Multidetector CT imaging of the abdomen and pelvis was performed using the standard protocol following bolus administration of intravenous contrast. CONTRAST:  162m OMNIPAQUE IOHEXOL 300 MG/ML  SOLN COMPARISON:  Ultrasound 07/20/2019, 12/21/2018. FINDINGS: Lower chest: No confluent opacities or effusions. Hepatobiliary: The liver is nodular compatible with cirrhosis. No focal hepatic abnormality. Prior cholecystectomy. Pancreas: No focal abnormality or ductal dilatation. Spleen: Splenomegaly with a craniocaudal length of 20 cm. Adrenals/Urinary Tract: 3.9 cm cyst in the lower pole of the left kidney. No hydronephrosis. Adrenal glands and urinary  bladder unremarkable. Stomach/Bowel: Descending colonic and sigmoid as well as scattered right colonic diverticulosis. No active diverticulitis. There are mildly dilated small bowel loops. Distal small bowel loops are decompressed. Findings could reflect mid partial small bowel obstruction. Exact transition not visualized. Vascular/Lymphatic: Aortic atherosclerosis. No enlarged abdominal or pelvic lymph nodes. Reproductive: No visible focal abnormality. Other: Moderate to large volume ascites, most prominent around the liver and spleen. Musculoskeletal: No acute bony abnormality. IMPRESSION: Changes of cirrhosis with associated splenomegaly and moderate to large volume ascites. Colonic diverticulosis. Prominent small bowel loops with transition in the mid small bowel. Exact transition is not visualized but appearance is concerning for partial small bowel obstruction. Aortic atherosclerosis. Electronically Signed   By: KRolm BaptiseM.D.   On: 07/21/2019 01:36   UKoreaAbdomen Limited  Result Date: 07/20/2019 CLINICAL DATA:  Abdominal distention. EXAM: ULTRASOUND ABDOMEN LIMITED COMPARISON:  12/21/2018. FINDINGS: Small amount of ascites noted. Largest pocket is in the right lower quadrant. IMPRESSION: Small amount of ascites. Electronically Signed   By: TMarcello Moores Register   On: 07/20/2019 08:13   UKoreaParacentesis  Result Date: 07/21/2019 INDICATION: NASH cirrhosis and ascites. EXAM: ULTRASOUND GUIDED PARACENTESIS MEDICATIONS: None. COMPLICATIONS: None immediate. PROCEDURE: Informed written consent was obtained from the patient after a discussion of the risks, benefits and alternatives to treatment. A timeout was performed prior to the initiation of the procedure. Initial ultrasound scanning demonstrates a large amount of ascites within the right lower abdominal quadrant. The right lower abdomen was prepped and draped in the usual sterile fashion. 1% lidocaine was used for local anesthesia. Following this, a 6 Fr  Safe-T-Centesis catheter was introduced. An ultrasound image was saved for documentation purposes. The paracentesis was performed. The catheter was removed and a dressing was applied. The patient tolerated the procedure well without immediate post procedural complication. FINDINGS: A total of approximately 3.1 of amber fluid was removed. Samples were sent to the laboratory as requested by the clinical team. IMPRESSION: Successful ultrasound-guided paracentesis yielding 3.1 liters of peritoneal fluid. Electronically Signed   By: AMarkus DaftM.D.   On: 07/21/2019 15:01   Medications:  I have reviewed the patient's current medications. Prior to Admission:  Medications Prior to Admission  Medication Sig Dispense Refill Last Dose  . Alogliptin Benzoate (NESINA) 12.5 MG TABS Take 12.5 mg by mouth daily.   07/20/2019 at 0800  . atorvastatin (LIPITOR) 10 MG tablet Take 10 mg by mouth daily.   07/19/2019 at Unknown time  . B Complex-C (B-COMPLEX WITH VITAMIN C) tablet Take 1 tablet by mouth daily.   07/20/2019 at 0800  . cholecalciferol (VITAMIN  D) 25 MCG (1000 UT) tablet Take 1,000 Units by mouth 2 (two) times daily.    07/20/2019 at 0800  . insulin aspart protamine- aspart (NOVOLOG MIX 70/30) (70-30) 100 UNIT/ML injection Inject 55 Units into the skin 2 (two) times daily with a meal.    07/20/2019 at 0800  . ipratropium-albuterol (DUONEB) 0.5-2.5 (3) MG/3ML SOLN Take 3 mLs by nebulization every 4 (four) hours as needed (shortness of breath).    Unknown at PRN  . levothyroxine (SYNTHROID, LEVOTHROID) 100 MCG tablet Take 100 mcg by mouth daily before breakfast.   07/20/2019 at 0700  . magnesium gluconate (MAGONATE) 500 MG tablet Take 500 mg by mouth 3 (three) times a week.    As directed at As directed  . omeprazole (PRILOSEC) 20 MG capsule Take 40 mg by mouth daily.    07/20/2019 at 0800  . tamsulosin (FLOMAX) 0.4 MG CAPS capsule Take 0.4 mg by mouth daily.    07/19/2019 at Unknown time  . terazosin (HYTRIN) 5 MG capsule  Take 5 mg by mouth at bedtime.   07/19/2019 at 2100  . traMADol (ULTRAM) 50 MG tablet Take 1 tablet (50 mg total) by mouth every 12 (twelve) hours as needed for moderate pain. 20 tablet 0 Unknown at PRN  . triamcinolone cream (KENALOG) 0.1 % Apply 1 application topically 3 (three) times daily as needed (skin conditions).    Unknown at PRN  . zinc sulfate 220 (50 Zn) MG capsule Take 1 capsule by mouth daily.   07/20/2019 at 0800  . acetaminophen (TYLENOL) 325 MG tablet Take 2 tablets (650 mg total) by mouth every 6 (six) hours as needed for mild pain (or Fever >/= 101). (Patient not taking: Reported on 01/21/2019)   Not Taking at Unknown time  . guaiFENesin-dextromethorphan (ROBITUSSIN DM) 100-10 MG/5ML syrup Take 5 mLs by mouth every 4 (four) hours as needed for cough (chest congestion). (Patient not taking: Reported on 07/20/2019) 118 mL 0 Not Taking at Unknown time  . hydrocortisone (ANUSOL-HC) 2.5 % rectal cream Apply rectally 2 times daily as needed for rectal pain or bleeding (Patient not taking: Reported on 07/20/2019) 30 g 0 Not Taking at Unknown time  . loperamide (IMODIUM A-D) 2 MG tablet Take 1 tablet (2 mg total) by mouth 4 (four) times daily as needed for diarrhea or loose stools. (Patient not taking: Reported on 01/21/2019) 12 tablet 0 Not Taking at Unknown time  . ondansetron (ZOFRAN ODT) 4 MG disintegrating tablet Take 1 tablet (4 mg total) by mouth every 8 (eight) hours as needed for nausea or vomiting. (Patient not taking: Reported on 01/21/2019) 6 tablet 0 Not Taking at Unknown time   Scheduled: . atorvastatin  10 mg Oral Daily  . B-complex with vitamin C  1 tablet Oral Daily  . furosemide  40 mg Oral Daily  . insulin aspart  0-15 Units Subcutaneous TID WC  . insulin aspart  0-5 Units Subcutaneous QHS  . insulin aspart protamine- aspart  55 Units Subcutaneous BID WC  . levothyroxine  100 mcg Oral QAC breakfast  . magnesium gluconate  500 mg Oral Once per day on Mon Wed Fri  . sodium chloride  flush  3 mL Intravenous Q12H  . spironolactone  100 mg Oral Daily  . tamsulosin  0.4 mg Oral Daily  . zinc sulfate  220 mg Oral Daily   Continuous: . cefTRIAXone (ROCEPHIN)  IV Stopped (07/21/19 1558)  . [START ON 07/22/2019] iron sucrose    . octreotide  (  SANDOSTATIN)    IV infusion 50 mcg/hr (07/21/19 1225)  . pantoprozole (PROTONIX) infusion 8 mg/hr (07/21/19 1222)   [id number], iohexol, ipratropium-albuterol, ondansetron **OR** ondansetron (ZOFRAN) IV, sodium chloride Anti-infectives (From admission, onward)   Start     Dose/Rate Route Frequency Ordered Stop   07/20/19 1500  cefTRIAXone (ROCEPHIN) 2 g in sodium chloride 0.9 % 100 mL IVPB     2 g 200 mL/hr over 30 Minutes Intravenous Every 24 hours 07/20/19 1436     07/20/19 1000  cefTRIAXone (ROCEPHIN) 2 g in sodium chloride 0.9 % 100 mL IVPB  Status:  Discontinued     2 g 200 mL/hr over 30 Minutes Intravenous Every 24 hours 07/20/19 0855 07/20/19 1436     Scheduled Meds: . atorvastatin  10 mg Oral Daily  . B-complex with vitamin C  1 tablet Oral Daily  . furosemide  40 mg Oral Daily  . insulin aspart  0-15 Units Subcutaneous TID WC  . insulin aspart  0-5 Units Subcutaneous QHS  . insulin aspart protamine- aspart  55 Units Subcutaneous BID WC  . levothyroxine  100 mcg Oral QAC breakfast  . magnesium gluconate  500 mg Oral Once per day on Mon Wed Fri  . sodium chloride flush  3 mL Intravenous Q12H  . spironolactone  100 mg Oral Daily  . tamsulosin  0.4 mg Oral Daily  . zinc sulfate  220 mg Oral Daily   Continuous Infusions: . cefTRIAXone (ROCEPHIN)  IV Stopped (07/21/19 1558)  . [START ON 07/22/2019] iron sucrose    . octreotide  (SANDOSTATIN)    IV infusion 50 mcg/hr (07/21/19 1225)  . 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

## 2019-07-21 NOTE — Consult Note (Signed)
NAME: Joshua Berry  DOB: 03-May-1946  MRN: [id number]  Date/Time: 07/21/2019 11:51 AM  REQUESTING PROVIDER: shah Subjective:  REASON FOR CONSULT: SBP ? 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 Patient presented to the ED on 07/19/2019 as he was told to go by his physician because of the lab work.  His hemoglobin was 6.5 .  In the ED his temperature was 98, blood pressure was 140/80, pulse ox of 97%, heart rate of 60 and respiratory rate of 16.  He also note that dark stools.  He received 1 unit of blood in the ED.  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  Coagulation was done with argon plasma.  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 I am seeing the [patient for possible SBP. Pt says he had pain abdomen due to distension which started 3-4 weeks ago. Since May he has been more fatigued and feels like he is going down hill. He has noted black tarry stools. He did not have any fever, or cough   Past medical history Group B strep bacteremia in Jan 2020 Rt glenohumeral infection with epidural enhancement MRI neck -Mild smooth epidural thickening and enhancement posteriorly extending from C2 to below the field of view into the thoracic spine as well as diffuse paraspinal muscle enhancement. Findings may represent a primary myositis (rhabdomyolysis, infection, autoimmune) with reactive epidural inflammation, however, epidural infection is  not excluded.  Cirrhosis with thrombocytopenia Degenerative joint disease DDD Osteoarthritis of the knees Left shoulder arthroplasty Right shoulder arthritis SCC scalp History of urinary retention  Past surgical history Left shoulder replacement Cholecystectomy SCC excision scalp and left ear  Social history Lives with his wife Former smoker     Family History  Heart disease Father    Hypertension Father    Cancer Mother    Diabetes Mother    Hypertension Mother    Stroke Mother    Thyroid disease Mother    Stroke Paternal Grandmother      Allergies  Allergen Reactions  . Lisinopril Hives  . Sulfamethoxazole Swelling  . Camphor Rash and Swelling  . Latex Rash  . Nickel Rash   ? Current Facility-Administered Medications  Medication Dose Route Frequency Provider Last Rate Last Dose  . 0.9 %  sodium chloride infusion (Manually program via Guardrails IV Fluids)   Intravenous Once Max Sane, MD   Stopped at 07/21/19 0327  . acetaminophen (TYLENOL) tablet 650 mg  650 mg Oral Q6H PRN Fritzi Mandes, MD   650 mg at 07/20/19 1906  . atorvastatin (LIPITOR) tablet 10 mg  10 mg Oral Daily Max Sane, MD   10 mg at 07/20/19 2153  . B-complex with vitamin C tablet 1 tablet  1 tablet Oral Daily Seals, Theo Dills, NP   1 tablet at 07/20/19 1559  . cefTRIAXone (ROCEPHIN) 2 g in sodium chloride 0.9 % 100 mL IVPB  2 g Intravenous Q24H  Rito Ehrlich A, RPH 200 mL/hr at 07/20/19 1500 2 g at 07/20/19 1500  . furosemide (LASIX) tablet 40 mg  40 mg Oral Daily Lin Landsman, MD   40 mg at 07/20/19 1605  . insulin aspart (novoLOG) injection 0-15 Units  0-15 Units Subcutaneous TID WC Seals, Theo Dills, NP   2 Units at 07/21/19 0818  . insulin aspart (novoLOG) injection 0-5 Units  0-5 Units Subcutaneous QHS Seals, Angela H, NP      . insulin aspart protamine- aspart (NOVOLOG MIX 70/30) injection 55 Units  55 Units Subcutaneous BID WC Shah, Vipul, MD      . iohexol (OMNIPAQUE)  240 MG/ML injection 50 mL  50 mL Oral Once PRN Vanga, Tally Due, MD      . ipratropium-albuterol (DUONEB) 0.5-2.5 (3) MG/3ML nebulizer solution 3 mL  3 mL Nebulization Q6H PRN Seals, Angela H, NP   3 mL at 07/20/19 1605  . levothyroxine (SYNTHROID) tablet 100 mcg  100 mcg Oral QAC breakfast Seals, Levada Dy H, NP      . magnesium gluconate (MAGONATE) tablet 500 mg  500 mg Oral Once per day on Mon Wed Fri Seals, Angela H, NP      . octreotide (SANDOSTATIN) 500 mcg in sodium chloride 0.9 % 250 mL (2 mcg/mL) infusion  50 mcg/hr Intravenous Continuous Harvest Dark, MD 25 mL/hr at 07/20/19 1911 50 mcg/hr at 07/20/19 1911  . ondansetron (ZOFRAN) tablet 4 mg  4 mg Oral Q6H PRN Seals, Theo Dills, NP       Or  . ondansetron (ZOFRAN) injection 4 mg  4 mg Intravenous Q6H PRN Seals, Theo Dills, NP      . pantoprazole (PROTONIX) 80 mg in sodium chloride 0.9 % 250 mL (0.32 mg/mL) infusion  8 mg/hr Intravenous Continuous Harvest Dark, MD 25 mL/hr at 07/20/19 2352 8 mg/hr at 07/20/19 2352  . sodium chloride (OCEAN) 0.65 % nasal spray 1 spray  1 spray Each Nare PRN Manuella Ghazi, Vipul, MD      . sodium chloride flush (NS) 0.9 % injection 3 mL  3 mL Intravenous Q12H Seals, Angela H, NP      . spironolactone (ALDACTONE) tablet 100 mg  100 mg Oral Daily Lin Landsman, MD   100 mg at 07/20/19 1558  . tamsulosin (FLOMAX) capsule 0.4 mg  0.4 mg Oral Daily Max Sane, MD   0.4 mg at 07/20/19 2153  . zinc sulfate capsule 220 mg  220 mg Oral Daily Seals, Theo Dills, NP   220 mg at 07/20/19 1559     Abtx:  Anti-infectives (From admission, onward)   Start     Dose/Rate Route Frequency Ordered Stop   07/20/19 1500  cefTRIAXone (ROCEPHIN) 2 g in sodium chloride 0.9 % 100 mL IVPB     2 g 200 mL/hr over 30 Minutes Intravenous Every 24 hours 07/20/19 1436     07/20/19 1000  cefTRIAXone (ROCEPHIN) 2 g in sodium chloride 0.9 % 100 mL IVPB  Status:  Discontinued     2 g 200 mL/hr over 30 Minutes Intravenous Every 24 hours  07/20/19 0855 07/20/19 1436      REVIEW OF SYSTEMS:  Const: negative fever, negative chills, negative weight loss Eyes: negative diplopia or visual changes, negative eye pain ENT: negative coryza, negative sore throat Resp: negative cough, hemoptysis, some dyspnea Cards: negative for chest pain, palpitations, has lower extremity edema GU: negative for frequency, dysuria and hematuria GI: as above Skin: negative for rash and pruritus  Heme: negative for easy bruising and gum/nose bleeding MS: general weakness- rt shoulder pain  Neurolo:negative for headaches, dizziness, vertigo, memory problems  Psych: negative for feelings of anxiety, depression  Endocrine:no polyuria or polydipsia  Objective:  VITALS:  BP (!) 111/59   Pulse 74   Temp 98.4 F (36.9 C) (Oral)   Resp 17   Ht 6' 2"  (1.88 m)   Wt 117 kg   SpO2 96%   BMI 33.13 kg/m  PHYSICAL EXAM:  General: Alert, cooperative, no distress, appears stated age.  Head: Normocephalic, without obvious abnormality, atraumatic. Eyes: Conjunctivae clear, anicteric sclerae. Pupils are equal ENTnot examined Neck: Supple, symmetrical, no adenopathy, thyroid: non tender no carotid bruit and no JVD. Back: No CVA tenderness. Lungs: decreased air entry bases  Heart: Z6X0, systolic murmur Abdomen: Soft,  distended. Bowel sounds normal. No masses, no tenderness Extremities: atraumatic, no cyanosis.some ankle edema. No clubbing Skin: No rashes or lesions. Or bruising Lymph: Cervical, supraclavicular normal. Neurologic: Grossly non-focal Pertinent Labs Lab Results CBC    Component Value Date/Time   WBC 6.2 07/21/2019 0844   RBC 2.58 (L) 07/21/2019 0844   HGB 7.4 (L) 07/21/2019 0844   HGB 12.3 (L) 05/01/2014 1553   HCT 23.7 (L) 07/21/2019 0844   HCT 36.6 (L) 05/01/2014 1553   PLT 68 (L) 07/21/2019 0844   PLT 72 (L) 05/01/2014 1553   MCV 91.9 07/21/2019 0844   MCV 96 05/01/2014 1553   MCH 28.7 07/21/2019 0844   MCHC 31.2  07/21/2019 0844   RDW 18.4 (H) 07/21/2019 0844   RDW 15.8 (H) 05/01/2014 1553   LYMPHSABS 0.5 (L) 07/20/2019 2243   MONOABS 0.4 07/20/2019 2243   EOSABS 0.1 07/20/2019 2243   BASOSABS 0.0 07/20/2019 2243    CMP Latest Ref Rng & Units 07/21/2019 07/20/2019 07/19/2019  Glucose 70 - 99 mg/dL 153(H) 120(H) 174(H)  BUN 8 - 23 mg/dL 16 18 16   Creatinine 0.61 - 1.24 mg/dL 1.27(H) 1.09 1.11  Sodium 135 - 145 mmol/L 139 141 140  Potassium 3.5 - 5.1 mmol/L 3.9 4.1 4.0  Chloride 98 - 111 mmol/L 105 111 108  CO2 22 - 32 mmol/L 26 26 25   Calcium 8.9 - 10.3 mg/dL 8.2(L) 8.2(L) 8.6(L)  Total Protein 6.5 - 8.1 g/dL - - 6.6  Total Bilirubin 0.3 - 1.2 mg/dL - - 0.7  Alkaline Phos 38 - 126 U/L - - 67  AST 15 - 41 U/L - - 44(H)  ALT 0 - 44 U/L - - 30      Microbiology: Recent Results (from the past 240 hour(s))  SARS Coronavirus 2 [facility] order, Performed in [facility] hospital lab) Nasopharyngeal Nasopharyngeal Swab     Status: None   Collection Time: 07/20/19 12:09 AM   Specimen: Nasopharyngeal Swab  Result Value Ref Range Status   SARS Coronavirus 2 NEGATIVE NEGATIVE Final    Comment: (NOTE) If result is NEGATIVE SARS-CoV-2 target nucleic acids are NOT DETECTED. The SARS-CoV-2 RNA is generally detectable in upper and lower  respiratory specimens during the acute phase of infection. The lowest  concentration of SARS-CoV-2 viral copies this assay can detect is 250  copies / mL. A negative result does not preclude SARS-CoV-2 infection  and should not be used as the sole basis for treatment or other  patient management decisions.  A negative result may occur with  improper specimen collection / handling, submission of specimen other  than nasopharyngeal swab, presence of viral mutation(s) within the  areas  targeted by this assay, and inadequate number of viral copies  (<250 copies / mL). A negative result must be combined with clinical  observations, patient history, and epidemiological  information. If result is POSITIVE SARS-CoV-2 target nucleic acids are DETECTED. The SARS-CoV-2 RNA is generally detectable in upper and lower  respiratory specimens dur ing the acute phase of infection.  Positive  results are indicative of active infection with SARS-CoV-2.  Clinical  correlation with patient history and other diagnostic information is  necessary to determine patient infection status.  Positive results do  not rule out bacterial infection or co-infection with other viruses. If result is PRESUMPTIVE POSTIVE SARS-CoV-2 nucleic acids MAY BE PRESENT.   A presumptive positive result was obtained on the submitted specimen  and confirmed on repeat testing.  While 2019 novel coronavirus  (SARS-CoV-2) nucleic acids may be present in the submitted sample  additional confirmatory testing may be necessary for epidemiological  and / or clinical management purposes  to differentiate between  SARS-CoV-2 and other Sarbecovirus currently known to infect humans.  If clinically indicated additional testing with an alternate test  methodology [phone]) is advised. The SARS-CoV-2 RNA is generally  detectable in upper and lower respiratory sp ecimens during the acute  phase of infection. The expected result is Negative. Fact Sheet for Patients:  StrictlyIdeas.no Fact Sheet for Healthcare Providers: BankingDealers.co.za This test is not yet approved or cleared by the [place] FDA and has been authorized for detection and/or diagnosis of SARS-CoV-2 by FDA under an Emergency Use Authorization (EUA).  This EUA will remain in effect (meaning this test can be used) for the duration of the COVID-19 declaration under Section 564(b)(1) of the Act, 21 U.S.C. section 360bbb-3(b)(1), unless the authorization is terminated or revoked sooner. Performed at [address] [facility], [address] [place], [place] [address]   CULTURE, BLOOD (ROUTINE X  2) w Reflex to ID Panel     Status: None (Preliminary result)   Collection Time: 07/20/19 10:34 PM   Specimen: BLOOD  Result Value Ref Range Status   Specimen Description BLOOD RIGHT ASSIST CONTROL  Final   Special Requests   Final    BOTTLES DRAWN AEROBIC AND ANAEROBIC Blood Culture adequate volume   Culture   Final    NO GROWTH < 12 HOURS Performed at [facility], [address] [place], [place] [address]    Report Status PENDING  Incomplete  CULTURE, BLOOD (ROUTINE X 2) w Reflex to ID Panel     Status: None (Preliminary result)   Collection Time: 07/20/19 10:42 PM   Specimen: BLOOD  Result Value Ref Range Status   Specimen Description BLOOD RIGHT ARM  Final   Special Requests   Final    BOTTLES DRAWN AEROBIC AND ANAEROBIC Blood Culture results may not be optimal due to an excessive volume of blood received in culture bottles   Culture   Final    NO GROWTH < 12 HOURS Performed at [facility], [address] [place], [place] [address]    Report Status PENDING  Incomplete    IMAGING RESULTS: Reviewed xray abdomen I have personally reviewed the films ? 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 Patient presented  to the ED on 07/19/2019 as he was told to go by his physician because of the lab work.  His hemoglobin was 6.5 .   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 ? ___________________________________________________ Discussed with patient and, requesting provider Note:  This document was prepared using Dragon voice recognition software and may include unintentional dictation errors.

## 2019-07-21 NOTE — Procedures (Signed)
Interventional Radiology Procedure:   Indications: Cirrhosis and ascites.  Procedure: US guided paracentesis  Findings: Removed 3.1 ml from RLQ quadrant  Complications: None     EBL: Less than 10 ml   Joshua Berry R. Anselm Pancoast, MD  Pager: [phone]

## 2019-07-21 NOTE — Plan of Care (Signed)

## 2019-07-21 NOTE — Progress Notes (Signed)
[facility] [place] NAME: Salahuddin Arismendez    MR#:  [id number]  DATE OF BIRTH:  [birth year]  SUBJECTIVE:  CHIEF COMPLAINT:   Chief Complaint  Patient presents with  . Abnormal Lab  Feels tired having abdominal pain, had fever last evening, also low blood count REVIEW OF SYSTEMS:  Review of Systems  Constitutional: Positive for fever. Negative for diaphoresis, malaise/fatigue and weight loss.  HENT: Negative for ear discharge, ear pain, hearing loss, nosebleeds, sore throat and tinnitus.   Eyes: Negative for blurred vision and pain.  Respiratory: Negative for cough, hemoptysis, shortness of breath and wheezing.   Cardiovascular: Negative for chest pain, palpitations, orthopnea and leg swelling.  Gastrointestinal: Positive for abdominal pain, blood in stool and melena. Negative for constipation, diarrhea, heartburn, nausea and vomiting.  Genitourinary: Negative for dysuria, frequency and urgency.  Musculoskeletal: Negative for back pain and myalgias.  Skin: Negative for itching and rash.  Neurological: Negative for dizziness, tingling, tremors, focal weakness, seizures, weakness and headaches.  Psychiatric/Behavioral: Negative for depression. The patient is not nervous/anxious.    DRUG ALLERGIES:   Allergies  Allergen Reactions  . Lisinopril Hives  . Sulfamethoxazole Swelling  . Camphor Rash and Swelling  . Latex Rash  . Nickel Rash   VITALS:  Blood pressure 125/70, pulse 71, temperature 98.4 F (36.9 C), temperature source Oral, resp. rate 17, height 6' 2"  (1.88 m), weight 117 kg, SpO2 97 %. PHYSICAL EXAMINATION:  Physical Exam HENT:     Head: Normocephalic and atraumatic.  Eyes:     Conjunctiva/sclera: Conjunctivae normal.     Pupils: Pupils are equal, round, and reactive to light.  Neck:     Musculoskeletal: Normal range of motion and neck supple.     Thyroid: No thyromegaly.     Trachea: No tracheal deviation.   Cardiovascular:     Rate and Rhythm: Normal rate and regular rhythm.     Heart sounds: Normal heart sounds.  Pulmonary:     Effort: Pulmonary effort is normal. No respiratory distress.     Breath sounds: Normal breath sounds. No wheezing.  Chest:     Chest wall: No tenderness.  Abdominal:     General: Bowel sounds are normal. There is distension.     Palpations: Abdomen is soft. There is fluid wave.     Tenderness: There is no abdominal tenderness.  Musculoskeletal: Normal range of motion.  Skin:    General: Skin is warm and dry.     Findings: No rash.  Neurological:     Mental Status: He is alert and oriented to person, place, and time.     Cranial Nerves: No cranial nerve deficit.    LABORATORY PANEL:  Male CBC Recent Labs  Lab 07/21/19 0844  WBC 6.2  HGB 7.4*  HCT 23.7*  PLT 68*   ------------------------------------------------------------------------------------------------------------------ Chemistries  Recent Labs  Lab 07/19/19 1341  07/21/19 0844  NA 140   < > 139  K 4.0   < > 3.9  CL 108   < > 105  CO2 25   < > 26  GLUCOSE 174*   < > 153*  BUN 16   < > 16  CREATININE 1.11   < > 1.27*  CALCIUM 8.6*   < > 8.2*  AST 44*  --   --   ALT 30  --   --   ALKPHOS 67  --   --  BILITOT 0.7  --   --    < > = values in this interval not displayed.   RADIOLOGY:  Dg Abd 1 View  Result Date: 07/20/2019 CLINICAL DATA:  Abdominal distension EXAM: ABDOMEN - 1 VIEW COMPARISON:  Ultrasound 07/20/2019 FINDINGS: Visible lung bases are clear. Scattered colon gas is present. Dilated small bowel measuring up to 5.7 cm. IMPRESSION: Dilated central small bowel with scattered colon gas. Findings could be secondary to developing or partial small bowel obstruction. Electronically Signed   By: Donavan Foil M.D.   On: 07/20/2019 18:04   Ct Abdomen Pelvis W Contrast  Result Date: 07/21/2019 CLINICAL DATA:  Abdominal pain, distention. EXAM: CT ABDOMEN AND PELVIS WITH CONTRAST  TECHNIQUE: Multidetector CT imaging of the abdomen and pelvis was performed using the standard protocol following bolus administration of intravenous contrast. CONTRAST:  167m OMNIPAQUE IOHEXOL 300 MG/ML  SOLN COMPARISON:  Ultrasound 07/20/2019, 12/21/2018. FINDINGS: Lower chest: No confluent opacities or effusions. Hepatobiliary: The liver is nodular compatible with cirrhosis. No focal hepatic abnormality. Prior cholecystectomy. Pancreas: No focal abnormality or ductal dilatation. Spleen: Splenomegaly with a craniocaudal length of 20 cm. Adrenals/Urinary Tract: 3.9 cm cyst in the lower pole of the left kidney. No hydronephrosis. Adrenal glands and urinary bladder unremarkable. Stomach/Bowel: Descending colonic and sigmoid as well as scattered right colonic diverticulosis. No active diverticulitis. There are mildly dilated small bowel loops. Distal small bowel loops are decompressed. Findings could reflect mid partial small bowel obstruction. Exact transition not visualized. Vascular/Lymphatic: Aortic atherosclerosis. No enlarged abdominal or pelvic lymph nodes. Reproductive: No visible focal abnormality. Other: Moderate to large volume ascites, most prominent around the liver and spleen. Musculoskeletal: No acute bony abnormality. IMPRESSION: Changes of cirrhosis with associated splenomegaly and moderate to large volume ascites. Colonic diverticulosis. Prominent small bowel loops with transition in the mid small bowel. Exact transition is not visualized but appearance is concerning for partial small bowel obstruction. Aortic atherosclerosis. Electronically Signed   By: KRolm BaptiseM.D.   On: 07/21/2019 01:36   ASSESSMENT AND PLAN:  WJERMALE CRASSis a 73y.o. male with metabolic syndrome, hypothyroidism, NKarlene Linemancirrhosis admitted with melena, symptomatic anemia, ascites  1. 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 - Monitor CBC closely, maintain hemoglobin greater than 7, platelets greater than 50 per GI  2. Cirrhosis of liver/NASH with ascites -Continue Aldactone and Lasix diuresis - [facility] scan did show moderate ascites we will try to have it drained under ultrasound guidance for therapeutic and diagnostic purposes  3. hypokalemia -  Replete and recheck  4.  Thrombocytopenia - Platelet count is 68  -Repeat CBC in the a.m. and continue to monitor platelet count closely  5.  Hypothyroidism - TSH normal -Continue Synthroid  6.  Partial small bowel obstruction -Appreciate surgical input, no surgical indication at this time. -Recommend starting clear liquid diet.  7.  Suspected spontaneous bacterial peritonitis -Getting paracentesis today hopefully can get a culture out of the fluid to see if it shows any signs of infection -I will order 2 sets of blood culture last evening.  Requested infectious disease consultation -On IV Rocephin empirically for now     All the records are reviewed and case discussed  with Care Management/Social Worker. Management plans discussed with the patient, nursing and they are in agreement.  CODE STATUS: Full Code  TOTAL TIME TAKING CARE OF THIS PATIENT: 35 minutes.   More than 50% of the time was spent in counseling/coordination of care: YES  POSSIBLE D/C IN 1-2 DAYS, DEPENDING ON CLINICAL CONDITION.   Max Sane M.D on 07/21/2019 at 1:43 PM  Between 7am to 6pm - Pager - [phone]  After 6pm go to [email or web address] - Proofreader  [facility]  Office  [phone]  CC: Primary care physician; Lorelei Pont, MD  Note: This dictation was prepared with Dragon  dictation along with smaller phrase technology. Any transcriptional errors that result from this process are unintentional.

## 2019-07-22 LAB — COMPREHENSIVE METABOLIC PANEL
ALT: 25 U/L (ref 0–44)
AST: 41 U/L (ref 15–41)
Albumin: 3.1 g/dL — ABNORMAL LOW (ref 3.5–5.0)
Alkaline Phosphatase: 61 U/L (ref 38–126)
Anion gap: 8 (ref 5–15)
BUN: 13 mg/dL (ref 8–23)
CO2: 27 mmol/L (ref 22–32)
Calcium: 8.4 mg/dL — ABNORMAL LOW (ref 8.9–10.3)
Chloride: 107 mmol/L (ref 98–111)
Creatinine, Ser: 1.08 mg/dL (ref 0.61–1.24)
GFR calc Af Amer: >60 mL/min (ref >60)
GFR calc non Af Amer: >60 mL/min (ref >60)
Glucose, Bld: 95 mg/dL (ref 70–99)
Potassium: 3.7 mmol/L (ref 3.5–5.1)
Sodium: 142 mmol/L (ref 135–145)
Total Bilirubin: 1.2 mg/dL (ref 0.3–1.2)
Total Protein: 5.8 g/dL — ABNORMAL LOW (ref 6.5–8.1)

## 2019-07-22 LAB — TYPE AND SCREEN
ABO/RH(D): O NEG
Antibody Screen: NEGATIVE
Unit division: 0
Unit division: 0
Unit division: 0

## 2019-07-22 LAB — GLUCOSE, CAPILLARY
Glucose-Capillary: 121 mg/dL — ABNORMAL HIGH (ref 70–99)
Glucose-Capillary: 181 mg/dL — ABNORMAL HIGH (ref 70–99)
Glucose-Capillary: 166 mg/dL — ABNORMAL HIGH (ref 70–99)
Glucose-Capillary: 95 mg/dL (ref 70–99)
Glucose-Capillary: 122 mg/dL — ABNORMAL HIGH (ref 70–99)

## 2019-07-22 LAB — PROTIME-INR
INR: 1.4 — ABNORMAL HIGH (ref 0.8–1.2)
Prothrombin Time: 16.9 seconds — ABNORMAL HIGH (ref 11.4–15.2)

## 2019-07-22 LAB — CBC
HCT: 23.4 % — ABNORMAL LOW (ref 39.0–52.0)
Hemoglobin: 7.2 g/dL — ABNORMAL LOW (ref 13.0–17.0)
MCH: 28.7 pg (ref 26.0–34.0)
MCHC: 30.8 g/dL (ref 30.0–36.0)
MCV: 93.2 fL (ref 80.0–100.0)
Platelets: 62 10*3/uL — ABNORMAL LOW (ref 150–400)
RBC: 2.51 MIL/uL — ABNORMAL LOW (ref 4.22–5.81)
RDW: 18.3 % — ABNORMAL HIGH (ref 11.5–15.5)
WBC: 3 10*3/uL — ABNORMAL LOW (ref 4.0–10.5)
nRBC: 0 % (ref 0.0–0.2)

## 2019-07-22 LAB — BPAM RBC
Blood Product Expiration Date: 202008042359
Blood Product Expiration Date: 202008072359
Blood Product Expiration Date: 202008192359
ISSUE DATE / TIME: 202008031920
ISSUE DATE / TIME: 202008041827
ISSUE DATE / TIME: 202008050344
Unit Type and Rh: 5100
Unit Type and Rh: 9500
Unit Type and Rh: 9500

## 2019-07-22 LAB — HEMOGLOBIN AND HEMATOCRIT, BLOOD
HCT: 24.7 % — ABNORMAL LOW (ref 39.0–52.0)
HCT: 26.4 % — ABNORMAL LOW (ref 39.0–52.0)
Hemoglobin: 7.9 g/dL — ABNORMAL LOW (ref 13.0–17.0)
Hemoglobin: 8.3 g/dL — ABNORMAL LOW (ref 13.0–17.0)

## 2019-07-22 LAB — ACID FAST SMEAR (AFB, MYCOBACTERIA): Acid Fast Smear: NEGATIVE

## 2019-07-22 LAB — PH, BODY FLUID: pH, Body Fluid: 7.7

## 2019-07-22 LAB — TRIGLYCERIDES, BODY FLUIDS: Triglycerides, Fluid: 25 mg/dL

## 2019-07-22 LAB — PROTEIN, BODY FLUID (OTHER): Total Protein, Body Fluid Other: 1.3 g/dL

## 2019-07-22 LAB — MAGNESIUM: Magnesium: 1.7 mg/dL (ref 1.7–2.4)

## 2019-07-22 LAB — PHOSPHORUS: Phosphorus: 4 mg/dL (ref 2.5–4.6)

## 2019-07-22 MED ORDER — NEOMYCIN-POLYMYXIN-HC 3.5-10000-1 OT SUSP
3.0000 [drp] | Freq: Four times a day (QID) | OTIC | Status: DC
  Administered 2019-07-22 – 2019-07-23 (×4): 3 [drp] via OTIC
  Filled 2019-07-22: qty 10

## 2019-07-22 MED ORDER — PANTOPRAZOLE SODIUM 40 MG PO TBEC
40.0000 mg | DELAYED_RELEASE_TABLET | Freq: Two times a day (BID) | ORAL | Status: DC
  Administered 2019-07-22 – 2019-07-23 (×2): 40 mg via ORAL
  Filled 2019-07-22 (×2): qty 1

## 2019-07-22 MED ORDER — MAGNESIUM CITRATE PO SOLN
1.0000 | Freq: Once | ORAL | Status: AC
  Administered 2019-07-22: 1 via ORAL
  Filled 2019-07-22: qty 296

## 2019-07-22 MED ORDER — PEG 3350-KCL-NA BICARB-NACL 420 G PO SOLR
4000.0000 mL | Freq: Once | ORAL | Status: AC
  Administered 2019-07-22: 20:00:00 4000 mL via ORAL
  Filled 2019-07-22: qty 4000

## 2019-07-22 MED ORDER — INSULIN ASPART PROT & ASPART (70-30 MIX) 100 UNIT/ML ~~LOC~~ SUSP
40.0000 [IU] | Freq: Two times a day (BID) | SUBCUTANEOUS | Status: DC
  Administered 2019-07-23: 40 [IU] via SUBCUTANEOUS
  Filled 2019-07-22: qty 10

## 2019-07-22 NOTE — Care Management Important Message (Signed)
Important Message  Patient Details  Name: Joshua Berry MRN: [id number] Date of Birth: October 28, 1946   Medicare Important Message Given:  Yes     Juliann Pulse A Kambrie Eddleman 07/22/2019, 11:28 AM

## 2019-07-22 NOTE — Evaluation (Signed)
Physical Therapy Evaluation Patient Details Name: Joshua Berry MRN: [id number] DOB: [birth year] Today's Date: 07/22/2019   History of Present Illness  73 yo male with onset of GI bleed and peritoneal fluid collection from cirrhosis was admitted for paracentesis and transfusion.  Has Suspected SBO, diverticulosis, had EGD with bleeding located on 07/20/19.  PMHx:  NASH cirrhosis with ascites, aortic atherosclerosis, hypothyroidism, HTN, DM, enlarged spleen, melanoma, OA, thrombocytopenia  Clinical Impression  Pt was seen for mobility on side of bed, and after a time could stand and walk a short trip with help.  Pt is finding his control of balance with a tendency to list backward, but has not completed transfusion.  Will recommend he have gait assist with RW initially and if not may need a short stay in rehab.  Follow acutely to ease the transition to home as pt can tolerate.  Will reassess the completed transfusion schedule to see how he manages this.    Follow Up Recommendations Home health PT;Supervision for mobility/OOB    Equipment Recommendations  None recommended by PT    Recommendations for Other Services       Precautions / Restrictions Precautions Precautions: Fall Precaution Comments: monitor BP as needed Restrictions Weight Bearing Restrictions: No      Mobility  Bed Mobility Overal bed mobility: Needs Assistance Bed Mobility: Supine to Sit;Sit to Supine     Supine to sit: Min assist Sit to supine: Mod assist   General bed mobility comments: min to support trunk up and mod for legs  Transfers Overall transfer level: Needs assistance Equipment used: Rolling walker (2 wheeled);1 person hand held assist Transfers: Sit to/from Stand Sit to Stand: From elevated surface;Min guard         General transfer comment: pt is using hands effectively to stand  Ambulation/Gait Ambulation/Gait assistance: Min guard Gait Distance (Feet): 30 Feet Assistive device: Rolling  walker (2 wheeled);1 person hand held assist Gait Pattern/deviations: Step-through pattern;Wide base of support;Decreased stride length Gait velocity: reduced Gait velocity interpretation: <1.31 ft/sec, indicative of household ambulator General Gait Details: pt is not completed with transfusion and is minimally light headed  Financial trader [employer] (Stroke Patients Only)       Balance Overall balance assessment: Needs assistance Sitting-balance support: Feet supported Sitting balance-Leahy Scale: Good     Standing balance support: Bilateral upper extremity supported;During functional activity Standing balance-Leahy Scale: Fair Standing balance comment: less than fair dynamically                             Pertinent Vitals/Pain Pain Assessment: No/denies pain    Home Living Family/patient expects to be discharged to:: Private residence Living Arrangements: Spouse/significant other Available Help at Discharge: Family;Available 24 hours/day Type of Home: House Home Access: Level entry     Home Layout: One level Home Equipment: Walker - 2 wheels;Cane - single point;Walker - 4 wheels      Prior Function Level of Independence: Independent with assistive device(s)         Comments: pt is receiving caregiver help 3x per week from [place] Hand: Right    Extremity/Trunk Assessment   Upper Extremity Assessment Upper Extremity Assessment: Overall WFL for tasks assessed    Lower Extremity Assessment Lower Extremity Assessment: Generalized weakness    Cervical / Trunk Assessment  Cervical / Trunk Assessment: Normal  Communication   Communication: No difficulties  Cognition Arousal/Alertness: Awake/alert Behavior During Therapy: WFL for tasks assessed/performed Overall Cognitive Status: Within Functional Limits for tasks assessed                                         General Comments General comments (skin integrity, edema, etc.): Pt is up to walk after having been assisted to BR by nursing and wants to go home from hosp.  Pt is weaker and looks like he may lose balance if distance is pushed    Exercises Other Exercises Other Exercises: strength is 4 to 4+ hips and WFL on knees and ankles   Assessment/Plan    PT Assessment Patient needs continued PT services  PT Problem List Decreased strength;Decreased range of motion;Decreased activity tolerance;Decreased balance;Decreased mobility;Decreased coordination;Cardiopulmonary status limiting activity       PT Treatment Interventions DME instruction;Gait training;Functional mobility training;Balance training;Therapeutic activities;Therapeutic exercise;Neuromuscular re-education;Patient/family education    PT Goals (Current goals can be found in the Care Plan section)  Acute Rehab PT Goals Patient Stated Goal: to walk and get home with his wife PT Goal Formulation: With patient Time For Goal Achievement: 08/05/19 Potential to Achieve Goals: Good    Frequency Min 2X/week   Barriers to discharge Decreased caregiver support wife is in wc and cannot assist him    Co-evaluation               AM-PAC PT "6 Clicks" Mobility  Outcome Measure Help needed turning from your back to your side while in a flat bed without using bedrails?: A Little Help needed moving from lying on your back to sitting on the side of a flat bed without using bedrails?: A Little Help needed moving to and from a bed to a chair (including a wheelchair)?: A Lot Help needed standing up from a chair using your arms (e.g., wheelchair or bedside chair)?: A Little Help needed to walk in hospital room?: A Little Help needed climbing 3-5 steps with a railing? : A Lot 6 Click Score: 16    End of Session Equipment Utilized During Treatment: Gait belt Activity Tolerance: Patient limited by fatigue;Treatment limited secondary to  medical complications (Comment) Patient left: in bed;with call bell/phone within reach;with bed alarm set Nurse Communication: Mobility status PT Visit Diagnosis: Unsteadiness on feet (R26.81);Muscle weakness (generalized) (M62.81);Difficulty in walking, not elsewhere classified (R26.2)    Time: 2707-8675 PT Time Calculation (min) (ACUTE ONLY): 43 min   Charges:   PT Evaluation $PT Eval Moderate Complexity: 1 Mod PT Treatments $Gait Training: 8-22 mins       Ramond Dial 07/22/2019, 5:39 PM   Mee Hives, PT MS Acute Rehab Dept. Number: [place] [facility] [place]

## 2019-07-22 NOTE — Progress Notes (Signed)
ID Doing better No abdominal pain No fever On examination Patient Vitals for the past 24 hrs:  BP Temp Temp src Pulse Resp SpO2  07/22/19 1540 (!) 91/55 - - 65 - -  07/22/19 1537 (!) 87/51 98.8 F (37.1 C) - 62 20 95 %  07/22/19 1025 135/66 98.4 F (36.9 C) Oral 61 18 98 %  07/22/19 0727 - - - - - 96 %  07/22/19 0358 134/69 98.2 F (36.8 C) Oral (!) 56 16 99 %  07/21/19 1948 (!) 114/56 98.5 F (36.9 C) Oral 65 16 99 %   Abdomen soft no tenderness Some distention Chest bilateral air entry Labs Peritoneal fluid Cell count 180 ( 2% N) TP 1.3  CBC Latest Ref Rng & Units 07/22/2019 07/22/2019 07/21/2019  WBC 4.0 - 10.5 K/uL - 3.0(L) 6.2  Hemoglobin 13.0 - 17.0 g/dL 7.9(L) 7.2(L) 7.4(L)  Hematocrit 39.0 - 52.0 % 24.7(L) 23.4(L) 23.7(L)  Platelets 150 - 400 K/uL - 62(L) 68(L)   CMP Latest Ref Rng & Units 07/22/2019 07/21/2019 07/20/2019  Glucose 70 - 99 mg/dL 95 153(H) 120(H)  BUN 8 - 23 mg/dL 13 16 18   Creatinine 0.61 - 1.24 mg/dL 1.08 1.27(H) 1.09  Sodium 135 - 145 mmol/L 142 139 141  Potassium 3.5 - 5.1 mmol/L 3.7 3.9 4.1  Chloride 98 - 111 mmol/L 107 105 111  CO2 22 - 32 mmol/L 27 26 26   Calcium 8.9 - 10.3 mg/dL 8.4(L) 8.2(L) 8.2(L)  Total Protein 6.5 - 8.1 g/dL 5.8(L) - -  Total Bilirubin 0.3 - 1.2 mg/dL 1.2 1.6(H) -  Alkaline Phos 38 - 126 U/L 61 - -  AST 15 - 41 U/L 41 - -  ALT 0 - 44 U/L 25 - -  Impression and recommendation  73 y.o.male  with a history of cirrhosis of the liver secondary to [place], diabetes mellitus, left shoulder replacement, bilateral osteoarthritis of the knees, DJD , group b strep bacteremia in Jan 2020 with rt glenohumeral infection /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 Patient presented to the ED on 07/19/2019 as he was told to go by his physician because of the lab work.  His hemoglobin was 6.5 .   Anemia secondary to GI loss- s/p endoscopy and treatment of gastric angiectasia.   Awaiting colonoscopy tomorrow.  Fever-one episode- resolved Had 3.5 L of peritoneal fluid removed yesterday.  No SBP as per fluid analysis.  Clinically abdomen soft with no tenderness some distention. continue SBP prophylaxis especially with GI  bleed-  on ceftriaxone- continue until 07/26/19. 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  ID will sign off- please call if neededagain

## 2019-07-22 NOTE — Progress Notes (Addendum)
[place] [facility] NAME: Aja Bolander    MR#:  [id number]  DATE OF BIRTH:  [birth year]  SUBJECTIVE:  CHIEF COMPLAINT:   Chief Complaint  Patient presents with  . Abnormal Lab  Little better with removal of 3 L of fluid from belly, overall tired REVIEW OF SYSTEMS:  Review of Systems  Constitutional: Negative for diaphoresis, malaise/fatigue and weight loss.  HENT: Negative for ear discharge, ear pain, hearing loss, nosebleeds, sore throat and tinnitus.   Eyes: Negative for blurred vision and pain.  Respiratory: Negative for cough, hemoptysis, shortness of breath and wheezing.   Cardiovascular: Negative for chest pain, palpitations, orthopnea and leg swelling.  Gastrointestinal: Positive for abdominal pain, blood in stool and melena. Negative for constipation, diarrhea, heartburn, nausea and vomiting.  Genitourinary: Negative for dysuria, frequency and urgency.  Musculoskeletal: Negative for back pain and myalgias.  Skin: Negative for itching and rash.  Neurological: Negative for dizziness, tingling, tremors, focal weakness, seizures, weakness and headaches.  Psychiatric/Behavioral: Negative for depression. The patient is not nervous/anxious.    DRUG ALLERGIES:   Allergies  Allergen Reactions  . Lisinopril Hives  . Sulfamethoxazole Swelling  . Camphor Rash and Swelling  . Latex Rash  . Nickel Rash   VITALS:  Blood pressure 135/66, pulse 61, temperature 98.4 F (36.9 C), temperature source Oral, resp. rate 18, height 6' 2"  (1.88 m), weight 117 kg, SpO2 98 %. PHYSICAL EXAMINATION:  Physical Exam HENT:     Head: Normocephalic and atraumatic.  Eyes:     Conjunctiva/sclera: Conjunctivae normal.     Pupils: Pupils are equal, round, and reactive to light.  Neck:     Musculoskeletal: Normal range of motion and neck supple.     Thyroid: No thyromegaly.     Trachea: No tracheal deviation.  Cardiovascular:     Rate and  Rhythm: Normal rate and regular rhythm.     Heart sounds: Normal heart sounds.  Pulmonary:     Effort: Pulmonary effort is normal. No respiratory distress.     Breath sounds: Normal breath sounds. No wheezing.  Chest:     Chest wall: No tenderness.  Abdominal:     General: Bowel sounds are normal. There is distension.     Palpations: Abdomen is soft. There is fluid wave.     Tenderness: There is no abdominal tenderness.  Musculoskeletal: Normal range of motion.  Skin:    General: Skin is warm and dry.     Findings: No rash.  Neurological:     Mental Status: He is alert and oriented to person, place, and time.     Cranial Nerves: No cranial nerve deficit.    LABORATORY PANEL:  Male CBC Recent Labs  Lab 07/22/19 0400  WBC 3.0*  HGB 7.2*  HCT 23.4*  PLT 62*   ------------------------------------------------------------------------------------------------------------------ Chemistries  Recent Labs  Lab 07/22/19 0400  NA 142  K 3.7  CL 107  CO2 27  GLUCOSE 95  BUN 13  CREATININE 1.08  CALCIUM 8.4*  MG 1.7  AST 41  ALT 25  ALKPHOS 61  BILITOT 1.2   RADIOLOGY:  US Paracentesis  Result Date: 07/21/2019 INDICATION: NASH cirrhosis and ascites. EXAM: ULTRASOUND GUIDED PARACENTESIS MEDICATIONS: None. COMPLICATIONS: None immediate. PROCEDURE: Informed written consent was obtained from the patient after a discussion of the risks, benefits and alternatives to treatment. A timeout was performed prior to the initiation of the procedure.  Initial ultrasound scanning demonstrates a large amount of ascites within the right lower abdominal quadrant. The right lower abdomen was prepped and draped in the usual sterile fashion. 1% lidocaine was used for local anesthesia. Following this, a 6 Fr Safe-T-Centesis catheter was introduced. An ultrasound image was saved for documentation purposes. The paracentesis was performed. The catheter was removed and a dressing was applied. The patient  tolerated the procedure well without immediate post procedural complication. FINDINGS: A total of approximately 3.1 of amber fluid was removed. Samples were sent to the laboratory as requested by the clinical team. IMPRESSION: Successful ultrasound-guided paracentesis yielding 3.1 liters of peritoneal fluid. Electronically Signed   By: Markus Daft M.D.   On: 07/21/2019 15:01   ASSESSMENT AND PLAN:  DEJA KAIGLER is a 73 y.o. male with metabolic syndrome, hypothyroidism, [name] cirrhosis admitted with melena, symptomatic anemia, ascites  1. 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 - Monitor CBC closely. we will check twice a day for now, maintain hemoglobin greater than 7, platelets greater than 50 per GI  2. Cirrhosis of liver/NASH with ascites -Continue Aldactone and Lasix diuresis - Follow-up with gastroenterologist at the [facility] after discharge -Status post IR guided paracentesis with removal of 3.1 L of fluid on 8/5  3. hypokalemia -  Repleted and resolved  4.  Thrombocytopenia - Platelet count is 62  -Repeat CBC in the a.m. and continue to monitor platelet count closely  5.  Hypothyroidism - TSH normal -Continue Synthroid  6.  Partial small bowel obstruction -Appreciate surgical input, no surgical indication at this time. -Continue clear liquid diet and advance to full liquid diet later today if GI is not planning any procedure  7.  Suspected spontaneous bacterial peritonitis -Negative peritoneal fluid culture and blood culture till now.  Appreciate infectious disease input -On IV Rocephin empirically for now     All the records are reviewed and case discussed with Care Management/Social  Worker. Management plans discussed with the patient, nursing and they are in agreement.  CODE STATUS: Full Code  TOTAL TIME TAKING CARE OF THIS PATIENT: 35 minutes.   More than 50% of the time was spent in counseling/coordination of care: YES  POSSIBLE D/C IN 1-2 DAYS, DEPENDING ON CLINICAL CONDITION.   Max Sane M.D on 07/22/2019 at 10:47 AM  Between 7am to 6pm - Pager - [phone]  After 6pm go to [email or web address] - Proofreader  [facility]  Office  [phone]  CC: Primary care physician; Lorelei Pont, MD  Note: This dictation was prepared with Dragon dictation along with smaller phrase technology. Any transcriptional errors that result from this process are unintentional.

## 2019-07-22 NOTE — Progress Notes (Signed)
Joshua Darby, MD [address]  [place]  [place], [place] [address]  Main: [phone]  Fax: [phone] Pager: [phone]   Subjective: Patient reports feeling much better today.  He has been diuresing well on diuretics with good urine output. He denies abdominal pain, nausea vomiting.  He denies any bowel movements.  He is passing gas.  He denies fever, chills.   Objective: Vital signs in last 24 hours: Vitals:   07/22/19 0727 07/22/19 1025 07/22/19 1537 07/22/19 1540  BP:  135/66 (!) 87/51 (!) 91/55  Pulse:  61 62 65  Resp:  18 20   Temp:  98.4 F (36.9 C) 98.8 F (37.1 C)   TempSrc:  Oral    SpO2: 96% 98% 95%   Weight:      Height:       Weight change:   Intake/Output Summary (Last 24 hours) at 07/22/2019 1553 Last data filed at 07/22/2019 1452 Gross per 24 hour  Intake 1669.76 ml  Output 4025 ml  Net -2355.24 ml     Exam: Heart:: Regular rate and rhythm or S1S2 present Lungs: normal and clear to auscultation Abdomen: Soft, nontender, mildly distended   Lab Results: CBC Latest Ref Rng & Units 07/22/2019 07/22/2019 07/21/2019  WBC 4.0 - 10.5 K/uL - 3.0(L) 6.2  Hemoglobin 13.0 - 17.0 g/dL 7.9(L) 7.2(L) 7.4(L)  Hematocrit 39.0 - 52.0 % 24.7(L) 23.4(L) 23.7(L)  Platelets 150 - 400 K/uL - 62(L) 68(L)   CMP Latest Ref Rng & Units 07/22/2019 07/21/2019 07/20/2019  Glucose 70 - 99 mg/dL 95 153(H) 120(H)  BUN 8 - 23 mg/dL 13 16 18   Creatinine 0.61 - 1.24 mg/dL 1.08 1.27(H) 1.09  Sodium 135 - 145 mmol/L 142 139 141  Potassium 3.5 - 5.1 mmol/L 3.7 3.9 4.1  Chloride 98 - 111 mmol/L 107 105 111  CO2 22 - 32 mmol/L 27 26 26   Calcium 8.9 - 10.3 mg/dL 8.4(L) 8.2(L) 8.2(L)  Total Protein 6.5 - 8.1 g/dL 5.8(L) - -  Total Bilirubin 0.3 - 1.2 mg/dL 1.2 1.6(H) -  Alkaline Phos 38 - 126 U/L 61 - -  AST 15 - 41 U/L 41 - -  ALT 0 - 44 U/L 25 - -    Micro Results: Recent Results (from the past 240 hour(s))  SARS Coronavirus 2 [facility] order, Performed in [facility]  hospital lab) Nasopharyngeal Nasopharyngeal Swab     Status: None   Collection Time: 07/20/19 12:09 AM   Specimen: Nasopharyngeal Swab  Result Value Ref Range Status   SARS Coronavirus 2 NEGATIVE NEGATIVE Final    Comment: (NOTE) If result is NEGATIVE SARS-CoV-2 target nucleic acids are NOT DETECTED. The SARS-CoV-2 RNA is generally detectable in upper and lower  respiratory specimens during the acute phase of infection. The lowest  concentration of SARS-CoV-2 viral copies this assay can detect is 250  copies / mL. A negative result does not preclude SARS-CoV-2 infection  and should not be used as the sole basis for treatment or other  patient management decisions.  A negative result may occur with  improper specimen collection / handling, submission of specimen other  than nasopharyngeal swab, presence of viral mutation(s) within the  areas targeted by this assay, and inadequate number of viral copies  (<250 copies / mL). A negative result must be combined with clinical  observations, patient history, and epidemiological information. If result is POSITIVE SARS-CoV-2 target nucleic acids are DETECTED. The SARS-CoV-2 RNA is generally detectable in upper  and lower  respiratory specimens dur ing the acute phase of infection.  Positive  results are indicative of active infection with SARS-CoV-2.  Clinical  correlation with patient history and other diagnostic information is  necessary to determine patient infection status.  Positive results do  not rule out bacterial infection or co-infection with other viruses. If result is PRESUMPTIVE POSTIVE SARS-CoV-2 nucleic acids MAY BE PRESENT.   A presumptive positive result was obtained on the submitted specimen  and confirmed on repeat testing.  While 2019 novel coronavirus  (SARS-CoV-2) nucleic acids may be present in the submitted sample  additional confirmatory testing may be necessary for epidemiological  and / or clinical management  purposes  to differentiate between  SARS-CoV-2 and other Sarbecovirus currently known to infect humans.  If clinically indicated additional testing with an alternate test  methodology [phone]) is advised. The SARS-CoV-2 RNA is generally  detectable in upper and lower respiratory sp ecimens during the acute  phase of infection. The expected result is Negative. Fact Sheet for Patients:  StrictlyIdeas.no Fact Sheet for Healthcare Providers: BankingDealers.co.za This test is not yet approved or cleared by the [place] FDA and has been authorized for detection and/or diagnosis of SARS-CoV-2 by FDA under an Emergency Use Authorization (EUA).  This EUA will remain in effect (meaning this test can be used) for the duration of the COVID-19 declaration under Section 564(b)(1) of the Act, 21 U.S.C. section 360bbb-3(b)(1), unless the authorization is terminated or revoked sooner. Performed at [facility], [address] [place], [place] [address]   CULTURE, BLOOD (ROUTINE X 2) w Reflex to ID Panel     Status: None (Preliminary result)   Collection Time: 07/20/19 10:34 PM   Specimen: BLOOD  Result Value Ref Range Status   Specimen Description BLOOD RIGHT ASSIST CONTROL  Final   Special Requests   Final    BOTTLES DRAWN AEROBIC AND ANAEROBIC Blood Culture adequate volume   Culture   Final    NO GROWTH 2 DAYS Performed at [facility], [address] [place], [place] [address]    Report Status PENDING  Incomplete  CULTURE, BLOOD (ROUTINE X 2) w Reflex to ID Panel     Status: None (Preliminary result)   Collection Time: 07/20/19 10:42 PM   Specimen: BLOOD  Result Value Ref Range Status   Specimen Description BLOOD RIGHT ARM  Final   Special Requests   Final    BOTTLES DRAWN AEROBIC AND ANAEROBIC Blood Culture results may not be optimal due to an excessive volume of blood received in culture bottles   Culture   Final     NO GROWTH 2 DAYS Performed at [facility], [address] [place], [place] [address]    Report Status PENDING  Incomplete  Body fluid culture     Status: None (Preliminary result)   Collection Time: 07/21/19  2:10 PM   Specimen: [facility] Cytology Peritoneal fluid  Result Value Ref Range Status   Specimen Description   Final    PERITONEAL Performed at [facility], [address] [place], [place] [address]    Special Requests   Final    NONE Performed at [facility], [address] [place], [place] [address]    Gram Stain   Final    RARE WBC PRESENT, PREDOMINANTLY MONONUCLEAR NO ORGANISMS SEEN    Culture   Final    NO GROWTH < 12 HOURS Performed at [facility], [place]  [address] [place], [place] [address]    Report Status PENDING  Incomplete  Aerobic/Anaerobic Culture (surgical/deep wound)     Status: None (Preliminary result)   Collection Time: 07/21/19  2:10 PM   Specimen: [facility] Cytology Peritoneal fluid  Result Value Ref Range Status   Specimen Description   Final    PERITONEAL Performed at [facility] [address] [place], [place] [address]    Special Requests   Final    NONE Performed at [facility], [address] [place], [place] [address]    Gram Stain   Final    FEW WBC PRESENT, PREDOMINANTLY MONONUCLEAR NO ORGANISMS SEEN    Culture   Final    NO GROWTH < 24 HOURS Performed at [facility], [place] [address] [place], [place] [address]    Report Status PENDING  Incomplete  Acid Fast Smear (AFB)     Status: None   Collection Time: 07/21/19  2:10 PM   Specimen: [facility] Cytology Peritoneal fluid  Result Value Ref Range Status   AFB Specimen Processing Concentration  Final   Acid Fast Smear Negative  Final    Comment: (NOTE) Performed At: [facility] [place], [place] [id number] Rush Farmer MD OL:[id number]    Source (AFB) PERI  Final    Comment: Performed at [facility], [address] [place], [place] [address]  Body fluid culture     Status: None (Preliminary result)   Collection Time: 07/21/19  2:10 PM   Specimen: Fluid  Result Value Ref Range Status   Specimen Description FLUID PERITONEAL  Final   Special Requests NONE  Final   Gram Stain   Final    WBC PRESENT, PREDOMINANTLY MONONUCLEAR FEW NO ORGANISMS SEEN    Culture   Final    NO GROWTH < 12 HOURS Performed at [facility], [place] [address] [place],  [address]    Report Status PENDING  Incomplete   Studies/Results: Dg Abd 1 View  Result Date: 07/20/2019 CLINICAL DATA:  Abdominal distension EXAM: ABDOMEN - 1 VIEW COMPARISON:  Ultrasound 07/20/2019 FINDINGS: Visible lung bases are clear. Scattered colon gas is present. Dilated small bowel measuring up to 5.7 cm. IMPRESSION: Dilated central small bowel with scattered colon gas. Findings could be secondary to developing or partial small bowel obstruction. Electronically Signed   By: Donavan Foil M.D.   On: 07/20/2019 18:04   Ct Abdomen Pelvis W Contrast  Result Date: 07/21/2019 CLINICAL DATA:  Abdominal pain, distention. EXAM: CT ABDOMEN AND PELVIS WITH CONTRAST TECHNIQUE: Multidetector CT imaging of the abdomen and pelvis was performed using the standard protocol following bolus administration of intravenous contrast. CONTRAST:  154m OMNIPAQUE IOHEXOL 300 MG/ML  SOLN COMPARISON:  Ultrasound 07/20/2019, 12/21/2018. FINDINGS: Lower chest: No confluent opacities or effusions. Hepatobiliary: The liver is nodular compatible with cirrhosis. No focal hepatic abnormality. Prior cholecystectomy. Pancreas: No focal abnormality or ductal dilatation. Spleen: Splenomegaly with a craniocaudal length of 20 cm. Adrenals/Urinary Tract: 3.9 cm cyst in the lower pole of the left kidney. No hydronephrosis. Adrenal glands and urinary bladder unremarkable. Stomach/Bowel: Descending colonic and sigmoid as well as scattered right colonic  diverticulosis. No active diverticulitis. There are mildly dilated small bowel loops. Distal small bowel loops are decompressed. Findings could reflect mid partial small bowel obstruction. Exact transition not visualized. Vascular/Lymphatic: Aortic atherosclerosis. No enlarged abdominal or pelvic lymph nodes. Reproductive: No visible focal abnormality. Other: Moderate to large volume ascites, most  prominent around the liver and spleen. Musculoskeletal: No acute bony abnormality. IMPRESSION: Changes of cirrhosis with associated splenomegaly and moderate to large volume ascites. Colonic diverticulosis. Prominent small bowel loops with transition in the mid small bowel. Exact transition is not visualized but appearance is concerning for partial small bowel obstruction. Aortic atherosclerosis. Electronically Signed   By: Rolm Baptise M.D.   On: 07/21/2019 01:36   US Paracentesis  Result Date: 07/21/2019 INDICATION: NASH cirrhosis and ascites. EXAM: ULTRASOUND GUIDED PARACENTESIS MEDICATIONS: None. COMPLICATIONS: None immediate. PROCEDURE: Informed written consent was obtained from the patient after a discussion of the risks, benefits and alternatives to treatment. A timeout was performed prior to the initiation of the procedure. Initial ultrasound scanning demonstrates a large amount of ascites within the right lower abdominal quadrant. The right lower abdomen was prepped and draped in the usual sterile fashion. 1% lidocaine was used for local anesthesia. Following this, a 6 Fr Safe-T-Centesis catheter was introduced. An ultrasound image was saved for documentation purposes. The paracentesis was performed. The catheter was removed and a dressing was applied. The patient tolerated the procedure well without immediate post procedural complication. FINDINGS: A total of approximately 3.1 of amber fluid was removed. Samples were sent to the laboratory as requested by the clinical team. IMPRESSION: Successful  ultrasound-guided paracentesis yielding 3.1 liters of peritoneal fluid. Electronically Signed   By: Markus Daft M.D.   On: 07/21/2019 15:01   Medications:  I have reviewed the patient's current medications. Prior to Admission:  Medications Prior to Admission  Medication Sig Dispense Refill Last Dose  . Alogliptin Benzoate (NESINA) 12.5 MG TABS Take 12.5 mg by mouth daily.   07/20/2019 at 0800  . atorvastatin (LIPITOR) 10 MG tablet Take 10 mg by mouth daily.   07/19/2019 at Unknown time  . B Complex-C (B-COMPLEX WITH VITAMIN C) tablet Take 1 tablet by mouth daily.   07/20/2019 at 0800  . cholecalciferol (VITAMIN D) 25 MCG (1000 UT) tablet Take 1,000 Units by mouth 2 (two) times daily.    07/20/2019 at 0800  . insulin aspart protamine- aspart (NOVOLOG MIX 70/30) (70-30) 100 UNIT/ML injection Inject 55 Units into the skin 2 (two) times daily with a meal.    07/20/2019 at 0800  . ipratropium-albuterol (DUONEB) 0.5-2.5 (3) MG/3ML SOLN Take 3 mLs by nebulization every 4 (four) hours as needed (shortness of breath).    Unknown at PRN  . levothyroxine (SYNTHROID, LEVOTHROID) 100 MCG tablet Take 100 mcg by mouth daily before breakfast.   07/20/2019 at 0700  . magnesium gluconate (MAGONATE) 500 MG tablet Take 500 mg by mouth 3 (three) times a week.    As directed at As directed  . omeprazole (PRILOSEC) 20 MG capsule Take 40 mg by mouth daily.    07/20/2019 at 0800  . tamsulosin (FLOMAX) 0.4 MG CAPS capsule Take 0.4 mg by mouth daily.    07/19/2019 at Unknown time  . terazosin (HYTRIN) 5 MG capsule Take 5 mg by mouth at bedtime.   07/19/2019 at 2100  . traMADol (ULTRAM) 50 MG tablet Take 1 tablet (50 mg total) by mouth every 12 (twelve) hours as needed for moderate pain. 20 tablet 0 Unknown at PRN  . triamcinolone cream (KENALOG) 0.1 % Apply 1 application topically 3 (three) times daily as needed (skin conditions).    Unknown at PRN  . zinc sulfate 220 (50 Zn) MG capsule Take 1 capsule by mouth daily.   07/20/2019 at 0800  .  acetaminophen (TYLENOL) 325  MG tablet Take 2 tablets (650 mg total) by mouth every 6 (six) hours as needed for mild pain (or Fever >/= 101). (Patient not taking: Reported on 01/21/2019)   Not Taking at Unknown time  . guaiFENesin-dextromethorphan (ROBITUSSIN DM) 100-10 MG/5ML syrup Take 5 mLs by mouth every 4 (four) hours as needed for cough (chest congestion). (Patient not taking: Reported on 07/20/2019) 118 mL 0 Not Taking at Unknown time  . hydrocortisone (ANUSOL-HC) 2.5 % rectal cream Apply rectally 2 times daily as needed for rectal pain or bleeding (Patient not taking: Reported on 07/20/2019) 30 g 0 Not Taking at Unknown time  . loperamide (IMODIUM A-D) 2 MG tablet Take 1 tablet (2 mg total) by mouth 4 (four) times daily as needed for diarrhea or loose stools. (Patient not taking: Reported on 01/21/2019) 12 tablet 0 Not Taking at Unknown time  . ondansetron (ZOFRAN ODT) 4 MG disintegrating tablet Take 1 tablet (4 mg total) by mouth every 8 (eight) hours as needed for nausea or vomiting. (Patient not taking: Reported on 01/21/2019) 6 tablet 0 Not Taking at Unknown time   Scheduled: . atorvastatin  10 mg Oral Daily  . B-complex with vitamin C  1 tablet Oral Daily  . furosemide  40 mg Oral Daily  . insulin aspart  0-15 Units Subcutaneous TID WC  . insulin aspart  0-5 Units Subcutaneous QHS  . insulin aspart protamine- aspart  55 Units Subcutaneous BID WC  . levothyroxine  100 mcg Oral QAC breakfast  . magnesium citrate  1 Bottle Oral Once  . magnesium gluconate  500 mg Oral Once per day on Mon Wed Fri  . neomycin-polymyxin-hydrocortisone  3 drop Right EAR Q6H  . pantoprazole  40 mg Oral BID AC  . polyethylene glycol-electrolytes  4,000 mL Oral Once  . sodium chloride flush  3 mL Intravenous Q12H  . spironolactone  100 mg Oral Daily  . tamsulosin  0.4 mg Oral Daily  . zinc sulfate  220 mg Oral Daily   Continuous: . cefTRIAXone (ROCEPHIN)  IV Stopped (07/21/19 1558)   EXB:[id number], iohexol,  ipratropium-albuterol, ondansetron **OR** ondansetron (ZOFRAN) IV, sodium chloride Anti-infectives (From admission, onward)   Start     Dose/Rate Route Frequency Ordered Stop   07/20/19 1500  cefTRIAXone (ROCEPHIN) 2 g in sodium chloride 0.9 % 100 mL IVPB     2 g 200 mL/hr over 30 Minutes Intravenous Every 24 hours 07/20/19 1436     07/20/19 1000  cefTRIAXone (ROCEPHIN) 2 g in sodium chloride 0.9 % 100 mL IVPB  Status:  Discontinued     2 g 200 mL/hr over 30 Minutes Intravenous Every 24 hours 07/20/19 0855 07/20/19 1436     Scheduled Meds: . atorvastatin  10 mg Oral Daily  . B-complex with vitamin C  1 tablet Oral Daily  . furosemide  40 mg Oral Daily  . insulin aspart  0-15 Units Subcutaneous TID WC  . insulin aspart  0-5 Units Subcutaneous QHS  . insulin aspart protamine- aspart  55 Units Subcutaneous BID WC  . levothyroxine  100 mcg Oral QAC breakfast  . magnesium citrate  1 Bottle Oral Once  . magnesium gluconate  500 mg Oral Once per day on Mon Wed Fri  . neomycin-polymyxin-hydrocortisone  3 drop Right EAR Q6H  . pantoprazole  40 mg Oral BID AC  . polyethylene glycol-electrolytes  4,000 mL Oral Once  . sodium chloride flush  3 mL Intravenous Q12H  . spironolactone  100 mg Oral  Daily  . tamsulosin  0.4 mg Oral Daily  . zinc sulfate  220 mg Oral Daily   Continuous Infusions: . 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 past midnight Patient received parenteral iron  Ascites: Fluid analysis did not reveal SBP Continue ceftriaxone for SBP prophylaxis with recent onset of GI bleed and febrile episode cultures no growth to date ID is consulted Continue low-dose diuretics  I personally spoke to patient's GI nurse at the [facility] over phone while he was on phone with her and updated her about his clinical condition, EGD findings and colonoscopy tomorrow    LOS: 3 days   Amyiah Gaba 07/22/2019, 3:53 PM

## 2019-07-23 ENCOUNTER — Inpatient Hospital Stay: Payer: No Typology Code available for payment source | Admitting: Certified Registered"

## 2019-07-23 ENCOUNTER — Encounter
Admission: EM | Disposition: A | Discharge status: Home Health | Payer: Self-pay | Source: Home / Self Care | Attending: Internal Medicine

## 2019-07-23 HISTORY — PX: COLONOSCOPY: SHX5424

## 2019-07-23 LAB — GLUCOSE, CAPILLARY
Glucose-Capillary: 96 mg/dL (ref 70–99)
Glucose-Capillary: 100 mg/dL — ABNORMAL HIGH (ref 70–99)
Glucose-Capillary: 114 mg/dL — ABNORMAL HIGH (ref 70–99)
Glucose-Capillary: 220 mg/dL — ABNORMAL HIGH (ref 70–99)

## 2019-07-23 LAB — CYTOLOGY - NON PAP

## 2019-07-23 LAB — HEMOGLOBIN AND HEMATOCRIT, BLOOD
HCT: 27.1 % — ABNORMAL LOW (ref 39.0–52.0)
HCT: 26.4 % — ABNORMAL LOW (ref 39.0–52.0)
Hemoglobin: 8.5 g/dL — ABNORMAL LOW (ref 13.0–17.0)
Hemoglobin: 8.2 g/dL — ABNORMAL LOW (ref 13.0–17.0)

## 2019-07-23 SURGERY — COLONOSCOPY
Anesthesia: General

## 2019-07-23 MED ORDER — SODIUM CHLORIDE 0.9 % IV SOLN
INTRAVENOUS | Status: DC
  Administered 2019-07-23: 11:00:00 via INTRAVENOUS

## 2019-07-23 MED ORDER — EPHEDRINE SULFATE 50 MG/ML IJ SOLN
INTRAMUSCULAR | Status: DC | PRN
  Administered 2019-07-23: 5 mg via INTRAVENOUS

## 2019-07-23 MED ORDER — PHENYLEPHRINE HCL (PRESSORS) 10 MG/ML IV SOLN
INTRAVENOUS | Status: DC | PRN
  Administered 2019-07-23 (×2): 100 ug via INTRAVENOUS

## 2019-07-23 MED ORDER — FUROSEMIDE 40 MG PO TABS: 40.0000 mg | ORAL_TABLET | Freq: Every day | ORAL | 0 refills | Status: DC

## 2019-07-23 MED ORDER — SPIRONOLACTONE 100 MG PO TABS: 100.0000 mg | ORAL_TABLET | Freq: Every day | ORAL | 0 refills | Status: DC

## 2019-07-23 MED ORDER — PROPOFOL 500 MG/50ML IV EMUL
INTRAVENOUS | Status: DC | PRN
  Administered 2019-07-23: 135 ug/kg/min via INTRAVENOUS

## 2019-07-23 MED ORDER — CIPROFLOXACIN HCL 500 MG PO TABS: 500.0000 mg | ORAL_TABLET | Freq: Two times a day (BID) | ORAL | 0 refills | Status: AC

## 2019-07-23 MED ORDER — SODIUM CHLORIDE 0.9 % IV SOLN
300.0000 mg | Freq: Once | INTRAVENOUS | Status: AC
  Administered 2019-07-23: 15:00:00 300 mg via INTRAVENOUS
  Filled 2019-07-23 (×2): qty 15

## 2019-07-23 MED ORDER — PROPOFOL 500 MG/50ML IV EMUL
INTRAVENOUS | Status: AC
  Filled 2019-07-23: qty 50

## 2019-07-23 MED ORDER — PROPOFOL 10 MG/ML IV BOLUS
INTRAVENOUS | Status: DC | PRN
  Administered 2019-07-23: 20 mg via INTRAVENOUS
  Administered 2019-07-23: 50 mg via INTRAVENOUS
  Administered 2019-07-23: 20 mg via INTRAVENOUS

## 2019-07-23 MED ORDER — PROPOFOL 10 MG/ML IV BOLUS
INTRAVENOUS | Status: AC
  Filled 2019-07-23: qty 20

## 2019-07-23 MED ORDER — PANTOPRAZOLE SODIUM 40 MG PO TBEC: 40.0000 mg | DELAYED_RELEASE_TABLET | Freq: Two times a day (BID) | ORAL | 0 refills | Status: DC

## 2019-07-23 NOTE — Anesthesia Postprocedure Evaluation (Signed)
Anesthesia Post Note  [name]  Procedure(s) Performed: COLONOSCOPY (N/A )  Patient location during evaluation: Endoscopy Anesthesia Type: General Level of consciousness: awake and alert Pain management: pain level controlled Vital Signs Assessment: post-procedure vital signs reviewed and stable Respiratory status: spontaneous breathing, nonlabored ventilation, respiratory function stable and patient connected to nasal cannula oxygen Cardiovascular status: blood pressure returned to baseline and stable Postop Assessment: no apparent nausea or vomiting Anesthetic complications: no     Last Vitals:  Vitals:   07/23/19 1301 07/23/19 1324  BP: 127/69 127/63  Pulse: 84 66  Resp: 13 20  Temp:  36.6 C  SpO2: 95% 93%    Last Pain:  Vitals:   07/23/19 1301  TempSrc:   PainSc: 0-No pain                 Martha Clan

## 2019-07-23 NOTE — Discharge Summary (Signed)
Joshua Berry at [place] NAME: Joshua Berry    MR#:  [id number]  DATE OF BIRTH:  Feb 20, 1946  DATE OF ADMISSION:  07/19/2019   ADMITTING PHYSICIAN: Gwynneth Aliment, MD  DATE OF DISCHARGE: 07/23/2019  6:01 PM  PRIMARY CARE PHYSICIAN: Lorelei Pont, MD   ADMISSION DIAGNOSIS:  Ascites [R18.8] Symptomatic anemia [D64.9] Gastrointestinal hemorrhage, unspecified gastrointestinal hemorrhage type [K92.2] DISCHARGE DIAGNOSIS:  Active Problems:   GI bleed  SECONDARY DIAGNOSIS:   Past Medical History:  Diagnosis Date  . Arthritis   . Cancer (HCC)    melanoma, carcinoma  . Cirrhosis of liver ([place])   . Diabetes mellitus without complication ([place])   . Heart murmur   . Hypertension   . Iron deficiency anemia   . NASH (nonalcoholic steatohepatitis)   . Sleep apnea   . Spleen enlarged   . Thrombocytopenia [facility])    HOSPITAL COURSE:  Joshua Berry a 73 y.o.malewith metabolic syndrome, hypothyroidism, [name] cirrhosis admitted with melena, symptomatic anemia, ascites  1. 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  2. Cirrhosis of liver/NASH with ascites -Continue Aldactone and Lasix for diuresis - [facility]Dr Bonna Gains after discharge -Status post IR guided paracentesis with removal of 3.1 L of fluid on 8/5  3.hypokalemia - Repleted and resolved  4. 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 Hypothyroidism -TSH normal -Continue Synthroid  6.  Partial small bowel obstruction -resolved  7.  Suspected spontaneous bacterial peritonitis -Negative peritoneal fluid culture and blood culture till now.  Appreciate infectious disease  input -Given ceftriaxone for SBP prophylaxis while in the Hospital and being D/Ced on cipro DISCHARGE CONDITIONS:  stable CONSULTS OBTAINED:   DRUG ALLERGIES:   Allergies  Allergen Reactions  . Lisinopril Hives  . Sulfamethoxazole Swelling  . Camphor Rash and Swelling  . Latex Rash  . Nickel Rash   DISCHARGE MEDICATIONS:   Allergies as of 07/23/2019      Reactions   Lisinopril Hives   Sulfamethoxazole Swelling   Camphor Rash, Swelling   Latex Rash   Nickel Rash      Medication List    STOP taking these medications   acetaminophen 325 MG tablet Commonly known as: TYLENOL   guaiFENesin-dextromethorphan 100-10 MG/5ML syrup Commonly known as: ROBITUSSIN DM   hydrocortisone 2.5 % rectal cream Commonly known as: Anusol-HC   loperamide 2 MG tablet Commonly known as: IMODIUM A-D   omeprazole 20 MG capsule Commonly known as: PRILOSEC   ondansetron 4 MG disintegrating tablet Commonly known as: Zofran ODT     TAKE these medications   atorvastatin 10 MG tablet Commonly known as: LIPITOR Take 10 mg by mouth daily.   B-complex with vitamin C tablet Take 1 tablet by mouth daily.   cholecalciferol 25 MCG (1000 UT) tablet Commonly known as: VITAMIN D Take 1,000 Units by mouth 2 (two) times daily.   ciprofloxacin 500 MG tablet Commonly known as: Cipro Take 1 tablet (500 mg total) by mouth 2 (two) times daily for 3 days.   furosemide 40 MG tablet Commonly known as: LASIX Take 1 tablet (40 mg total) by mouth daily. Start taking on: July 24, 2019   insulin aspart protamine- aspart (70-30) 100 UNIT/ML injection Commonly known as:  NOVOLOG MIX 70/30 Inject 55 Units into the skin 2 (two) times daily with a meal.   ipratropium-albuterol 0.5-2.5 (3) MG/3ML Soln Commonly known as: DUONEB Take 3 mLs by nebulization every 4 (four) hours as needed (shortness of breath).   levothyroxine 100 MCG tablet Commonly known as: SYNTHROID Take 100 mcg by mouth daily before  breakfast.   magnesium gluconate 500 MG tablet Commonly known as: MAGONATE Take 500 mg by mouth 3 (three) times a week.   Nesina 12.5 MG Tabs Generic drug: Alogliptin Benzoate Take 12.5 mg by mouth daily.   pantoprazole 40 MG tablet Commonly known as: PROTONIX Take 1 tablet (40 mg total) by mouth 2 (two) times daily before a meal.   spironolactone 100 MG tablet Commonly known as: ALDACTONE Take 1 tablet (100 mg total) by mouth daily. Start taking on: July 24, 2019   tamsulosin 0.4 MG Caps capsule Commonly known as: FLOMAX Take 0.4 mg by mouth daily.   terazosin 5 MG capsule Commonly known as: HYTRIN Take 5 mg by mouth at bedtime.   traMADol 50 MG tablet Commonly known as: ULTRAM Take 1 tablet (50 mg total) by mouth every 12 (twelve) hours as needed for moderate pain.   triamcinolone cream 0.1 % Commonly known as: KENALOG Apply 1 application topically 3 (three) times daily as needed (skin conditions).   zinc sulfate 220 (50 Zn) MG capsule Take 1 capsule by mouth daily.        DISCHARGE INSTRUCTIONS:   DIET:  Regular diet DISCHARGE CONDITION:  Good ACTIVITY:  Activity as tolerated OXYGEN:  Home Oxygen: No.  Oxygen Delivery: room air DISCHARGE LOCATION:  home   If you experience worsening of your admission symptoms, develop shortness of breath, life threatening emergency, suicidal or homicidal thoughts you must seek medical attention immediately by calling 911 or calling your MD immediately  if symptoms less severe.  You Must read complete instructions/literature along with all the possible adverse reactions/side effects for all the Medicines you take and that have been prescribed to you. Take any new Medicines after you have completely understood and accpet all the possible adverse reactions/side effects.   Please note  You were cared for by a hospitalist during your hospital stay. If you have any questions about your discharge medications or the care you  received while you were in the hospital after you are discharged, you can call the unit and asked to speak with the hospitalist on call if the hospitalist that took care of you is not available. Once you are discharged, your primary care physician will handle any further medical issues. Please note that NO REFILLS for any discharge medications will be authorized once you are discharged, as it is imperative that you return to your primary care physician (or establish a relationship with a primary care physician if you do not have one) for your aftercare needs so that they can reassess your need for medications and monitor your lab values.    On the day of Discharge:  VITAL SIGNS:  Blood pressure 127/63, pulse 66, temperature 97.9 F (36.6 C), resp. rate 20, height 6' 2"  (1.88 m), weight 117 kg, SpO2 93 %. PHYSICAL EXAMINATION:  GENERAL:  73 y.o.-year-old patient lying in the bed with no acute distress.  EYES: Pupils equal, round, reactive to light and accommodation. No scleral icterus. Extraocular muscles intact.  HEENT: Head atraumatic, normocephalic. Oropharynx and nasopharynx clear.  NECK:  Supple, no jugular venous distention. No thyroid enlargement, no tenderness.  LUNGS:  Normal breath sounds bilaterally, no wheezing, rales,rhonchi or crepitation. No use of accessory muscles of respiration.  CARDIOVASCULAR: S1, S2 normal. No murmurs, rubs, or gallops.  ABDOMEN: Soft, non-tender, non-distended. Bowel sounds present. No organomegaly or mass.  EXTREMITIES: No pedal edema, cyanosis, or clubbing.  NEUROLOGIC: Cranial nerves II through XII are intact. Muscle strength 5/5 in all extremities. Sensation intact. Gait not checked.  PSYCHIATRIC: The patient is alert and oriented x 3.  SKIN: No obvious rash, lesion, or ulcer.  DATA REVIEW:   CBC Recent Labs  Lab 07/22/19 0400  07/23/19 1714  WBC 3.0*  --   --   HGB 7.2*   < > 8.2*  HCT 23.4*   < > 26.4*  PLT 62*  --   --    < > = values in this  interval not displayed.    Chemistries  Recent Labs  Lab 07/22/19 0400  NA 142  K 3.7  CL 107  CO2 27  GLUCOSE 95  BUN 13  CREATININE 1.08  CALCIUM 8.4*  MG 1.7  AST 41  ALT 25  ALKPHOS 61  BILITOT 1.2     Follow-up Information    Lorelei Pont, MD. Schedule an appointment as soon as possible for a visit in 5 day(s).   Specialty: Internal Medicine Contact information: [address] [place] [phone]        Virgel Manifold, MD. Schedule an appointment as soon as possible for a visit in 2 week(s).   Specialty: Gastroenterology Contact information: [place] [address] [phone]           Management plans discussed with the patient, family and they are in agreement.  CODE STATUS: Full Code   TOTAL TIME TAKING CARE OF THIS PATIENT: 45 minutes.    Max Sane M.D on 07/23/2019 at 6:27 PM  Between 7am to 6pm - Pager - [phone]  After 6pm go to [email or web address] - Proofreader  [facility]  Office  [phone]  CC: Primary care physician; Lorelei Pont, MD   Note: This dictation was prepared with Dragon dictation along with smaller phrase technology. Any transcriptional errors that result from this process are unintentional.

## 2019-07-23 NOTE — Op Note (Signed)
[facility] Gastroenterology Patient Name: Joshua Berry Procedure Date: 07/23/2019 11:43 AM MRN: [id number] Account #: [id number] Date of Birth: 1946/02/17 Admit Type: Inpatient Age: 73 Room: [facility] ENDO ROOM 1 Gender: Male Note Status: Finalized Procedure:            Colonoscopy Indications:          Iron deficiency anemia secondary to chronic blood loss,                        Iron deficiency anemia Providers:            Lin Landsman MD, MD Referring MD:         Colonel Bald (Referring MD) Medicines:            Monitored Anesthesia Care Complications:        No immediate complications. Estimated blood loss: None. Procedure:            Pre-Anesthesia Assessment:                       - Prior to the procedure, a History and Physical was                        performed, and patient medications and allergies were                        reviewed. The patient is competent. The risks and                        benefits of the procedure and the sedation options and                        risks were discussed with the patient. All questions                        were answered and informed consent was obtained.                        Patient identification and proposed procedure were                        verified by the physician, the nurse, the                        anesthesiologist, the anesthetist and the technician in                        the pre-procedure area in the procedure room in the                        endoscopy suite. Mental Status Examination: alert and                        oriented. Airway Examination: normal oropharyngeal                        airway and neck mobility. Respiratory Examination:                        clear to auscultation. CV Examination: normal.  Prophylactic Antibiotics: The patient does not require                        prophylactic antibiotics. Prior Anticoagulants: The   patient has taken no previous anticoagulant or                        antiplatelet agents. ASA Grade Assessment: III - A                        patient with severe systemic disease. After reviewing                        the risks and benefits, the patient was deemed in                        satisfactory condition to undergo the procedure. The                        anesthesia plan was to use monitored anesthesia care                        (MAC). Immediately prior to administration of                        medications, the patient was re-assessed for adequacy                        to receive sedatives. The heart rate, respiratory rate,                        oxygen saturations, blood pressure, adequacy of                        pulmonary ventilation, and response to care were                        monitored throughout the procedure. The physical status                        of the patient was re-assessed after the procedure.                       After obtaining informed consent, the colonoscope was                        passed under direct vision. Throughout the procedure,                        the patient's blood pressure, pulse, and oxygen                        saturations were monitored continuously. The                        Colonoscope was introduced through the anus and                        advanced to the the terminal ileum, with identification  of the appendiceal orifice and IC valve. The                        colonoscopy was somewhat difficult due to multiple                        diverticula in the colon and significant looping.                        Successful completion of the procedure was aided by                        applying abdominal pressure. The patient tolerated the                        procedure well. The quality of the bowel preparation                        was good. Findings:      Hemorrhoids were found on perianal exam.       The terminal ileum appeared normal.      Three sessile polyps were found in the transverse colon and ascending       colon. The polyps were 5 mm in size. These polyps were removed with a       cold snare. Resection and retrieval were complete.      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      Non-bleeding external and internal hemorrhoids were found during       retroflexion. The hemorrhoids were medium-sized. Impression:           - Hemorrhoids found on perianal exam.                       - The examined portion of the ileum was normal.                       - Three 5 mm polyps in the transverse colon and in the                        ascending colon, removed with a cold snare. Resected                        and retrieved.                       - Diverticulosis in the sigmoid colon, in the                        descending colon and in the transverse colon.                       - Non-bleeding external and internal hemorrhoids. Recommendation:       - Return patient to hospital ward for possible                        discharge same day.                       - Low sodium diet today.                       -  Continue present medications.                       - Await pathology results.                       - Repeat colonoscopy in 5 years for surveillance.                       - Return to GI clinic as previously scheduled. Procedure Code(s):    --- Professional ---                       [phone], Colonoscopy, flexible; with removal of tumor(s),                        polyp(s), or other lesion(s) by snare technique Diagnosis Code(s):    --- Professional ---                       K64.8, Other hemorrhoids                       K63.5, Polyp of colon                       D50.0, Iron deficiency anemia secondary to blood loss                        (chronic)                       D50.9, Iron deficiency anemia, unspecified                        K57.30, Diverticulosis of large intestine without                        perforation or abscess without bleeding CPT copyright 2019 American Medical Association. All rights reserved. The codes documented in this report are preliminary and upon coder review may  be revised to meet current compliance requirements. Dr. Ulyess Mort Lin Landsman MD, MD 07/23/2019 12:18:58 PM This report has been signed electronically. Number of Addenda: 0 Note Initiated On: 07/23/2019 11:43 AM Scope Withdrawal Time: 0 hours 12 minutes 17 seconds  Total Procedure Duration: 0 hours 21 minutes 54 seconds  Estimated Blood Loss: Estimated blood loss was minimal.      [facility]

## 2019-07-23 NOTE — Progress Notes (Signed)
Patient given tap water enema per order. Enema given three times to help clear patient out for colonoscopy. Third time, small amount of blood noted to be in [facility]. Enemas stopped and Endoscopy RN, Dava notified. Pt verbalized no complaints, no pain- no outward bleeding noted, VSS.

## 2019-07-23 NOTE — Transfer of Care (Signed)
Immediate Anesthesia Transfer of Care Note  [name]  Procedure(s) Performed: COLONOSCOPY (N/A )  Patient Location: Endoscopy Unit  Anesthesia Type:General  Level of Consciousness: drowsy, patient cooperative and responds to stimulation  Airway & Oxygen Therapy: Patient Spontanous Breathing and Patient connected to face mask oxygen  Post-op Assessment: Report given to RN and Post -op Vital signs reviewed and stable  Post vital signs: Reviewed and stable  Last Vitals:  Vitals Value Taken Time  BP 132/60 07/23/19 1221  Temp    Pulse 92 07/23/19 1221  Resp 13 07/23/19 1221  SpO2 97 % 07/23/19 1221  Vitals shown include unvalidated device data.  Last Pain:  Vitals:   07/23/19 1055  TempSrc: Oral  PainSc: 0-No pain      Patients Stated Pain Goal: 0 (95/39/67 2897)  Complications: No apparent anesthesia complications

## 2019-07-23 NOTE — TOC Transition Note (Signed)
Transition of Care [facility]) - CM/SW Discharge Note   Patient Details  Name: Joshua Berry MRN: [id number] Date of Birth: 05-28-1946  Transition of Care [facility]) CM/SW Contact:  Randel Hargens, Lenice Llamas Phone Number: [phone]  07/23/2019, 3:26 PM   Clinical Narrative:  Clinical Social Worker (CSW) notified [place] representative that patient will D/C home today. Patient has no DME needs. Please reconsult if future social work needs arise. CSW signing off.          Final next level of care: [place] Barriers to Discharge: Barriers Resolved   Patient Goals and CMS Choice Patient states their goals for this hospitalization and ongoing recovery are:: To feel better CMS Medicare.gov Compare Post Acute Care list provided to:: Patient Choice offered to / list presented to : Patient  Discharge Placement                       Discharge Plan and Services In-house Referral: Clinical Social Work   Post Acute Care Choice: Home Health                    HH Arranged: PT [facility] Agency: [place] ([facility]) Date [place]: 07/23/19 Time [place]: 7282 Representative spoke with at [place]: [place] ([place]) Interventions     Readmission Risk Interventions No flowsheet data found.

## 2019-07-23 NOTE — Anesthesia Post-op Follow-up Note (Signed)
Anesthesia QCDR form completed.        

## 2019-07-23 NOTE — Progress Notes (Signed)
Pt being discharged home, discharge instructions reviewed with pt, states understanding, pt with no complaints, awaiting daughter for transport

## 2019-07-23 NOTE — Anesthesia Preprocedure Evaluation (Signed)
Anesthesia Evaluation  Patient identified by MRN, date of birth, ID band Patient awake    Reviewed: Allergy & Precautions, NPO status , Patient's Chart, lab work & pertinent test results  History of Anesthesia Complications Negative for: history of anesthetic complications  Airway Mallampati: II  TM Distance: >3 FB Neck ROM: Full    Dental  (+) Poor Dentition   Pulmonary sleep apnea and Continuous Positive Airway Pressure Ventilation , neg COPD, former smoker,    breath sounds clear to auscultation- rhonchi (-) wheezing      Cardiovascular hypertension, Pt. on medications (-) CAD, (-) Past MI, (-) Cardiac Stents and (-) CABG  Rhythm:Regular Rate:Normal - Systolic murmurs and - Diastolic murmurs    Neuro/Psych neg Seizures negative neurological ROS  negative psych ROS   GI/Hepatic negative GI ROS, (+) Cirrhosis       , Hepatitis - (NASH)  Endo/Other  diabetes, Insulin Dependent  Renal/GU negative Renal ROS     Musculoskeletal  (+) Arthritis ,   Abdominal (+) + obese,   Peds  Hematology  (+) anemia ,   Anesthesia Other Findings Past Medical History: No date: Arthritis No date: Cancer (HCC)     Comment:  melanoma, carcinoma No date: Cirrhosis of liver (HCC) No date: Diabetes mellitus without complication (HCC) No date: Heart murmur No date: Hypertension No date: Iron deficiency anemia No date: NASH (nonalcoholic steatohepatitis) No date: Sleep apnea No date: Spleen enlarged No date: Thrombocytopenia ([place])   Reproductive/Obstetrics                             Anesthesia Physical Anesthesia Plan  ASA: III  Anesthesia Plan: General   Post-op Pain Management:    Induction: Intravenous  PONV Risk Score and Plan: 1 and Propofol infusion  Airway Management Planned: Natural Airway  Additional Equipment:   Intra-op Plan:   Post-operative Plan:   Informed Consent: I have  reviewed the patients History and Physical, chart, labs and discussed the procedure including the risks, benefits and alternatives for the proposed anesthesia with the patient or authorized representative who has indicated his/her understanding and acceptance.     Dental advisory given  Plan Discussed with: CRNA and Anesthesiologist  Anesthesia Plan Comments:         Anesthesia Quick Evaluation

## 2019-07-23 NOTE — TOC Initial Note (Signed)
Transition of Care [facility]) - Initial/Assessment Note    Patient Details  Name: Joshua Berry MRN: [id number] Date of Birth: 11-Nov-1946  Transition of Care [facility]) CM/SW Contact:    Chana Lindstrom, Lenice Llamas Phone Number: [phone]  07/23/2019, 10:57 AM  Clinical Narrative: PT is recommending home health. Clinical Education officer, museum (CSW) met with patient alone at bedside to discuss D/C plan. Patient was alert and oriented X4 and was sitting up in the bed. CSW introduced self and explained role of CSW department. Per patient he lives at [facility] independent living [facility]. Per patient he lives with his wife Madaline Savage. Patient reported that he receives care at the [facility]. CSW explained home health and provided CMS home health list to patient. [facility] Per [facility] representative they can accept patient. Patient reported that he has a walker and bedside commode at home. Patient reported no other needs or concerns. CSW will continue to follow and assist as needed.                    Expected Discharge Plan: [place] Barriers to Discharge: Continued Medical Work up   Patient Goals and CMS Choice Patient states their goals for this hospitalization and ongoing recovery are:: To feel better CMS Medicare.gov Compare Post Acute Care list provided to:: Patient Choice offered to / list presented to : Patient  Expected Discharge Plan and Services Expected Discharge Plan: [place] In-house Referral: Clinical Social Work   Post Acute Care Choice: [place] arrangements for the past 2 months: [place]: PT [place]: [place] ([place]) Date [place]: 07/23/19 Time [place]: 1030 Representative spoke with at [place]: Corene Cornea  Prior Living Arrangements/Services Living arrangements for the past 2 months:  [place] Lives with:: Spouse Patient language and need for interpreter reviewed:: No Do you feel safe going back to the place where you live?: Yes      Need for Family Participation in Patient Care: No (Comment) Care giver support system in place?: Yes (comment) Current home services: DME(Patient has a walker and bedside commode at home.) Criminal Activity/Legal Involvement Pertinent to Current Situation/Hospitalization: No - Comment as needed  Activities of Daily Living Home Assistive Devices/Equipment: Eyeglasses, Other (Comment)(Rollator) ADL Screening (condition at time of admission) Patient's cognitive ability adequate to safely complete daily activities?: Yes Is the patient deaf or have difficulty hearing?: No Does the patient have difficulty seeing, even when wearing glasses/contacts?: No Does the patient have difficulty concentrating, remembering, or making decisions?: No Patient able to express need for assistance with ADLs?: Yes Does the patient have difficulty dressing or bathing?: No Independently performs ADLs?: Yes (appropriate for developmental age) Does the patient have difficulty walking or climbing stairs?: Yes Weakness of Legs: Both Weakness of Arms/Hands: None  Permission Sought/Granted Permission sought to share information with : [facility] agency) Permission granted to share information with : Yes, Verbal Permission Granted              Emotional Assessment Appearance:: Appears stated age Attitude/Demeanor/Rapport: Engaged Affect (typically observed): Pleasant, Calm Orientation: : Oriented to Self, Oriented to Place, Oriented to  Time Alcohol / Substance Use: Not Applicable Psych Involvement: No (comment)  Admission  diagnosis:  Ascites [R18.8] Symptomatic anemia [D64.9] Gastrointestinal hemorrhage, unspecified gastrointestinal hemorrhage type [K92.2] Patient Active Problem List   Diagnosis Date Noted  . GI bleed 07/19/2019  .  Sepsis ([place]) 12/20/2018   PCP:  Lorelei Pont, MD Pharmacy:   [id number] [place], [place] - [id number]: [phone]: [phone]    Social Determinants of Health (SDOH) Interventions    Readmission Risk Interventions No flowsheet data found.

## 2019-07-23 NOTE — Discharge Instructions (Signed)
Ascites  Ascites is a collection of too much fluid in the abdomen. Ascites can range from mild to severe. If ascites is not treated, it can get worse. What are the causes? This condition may be caused by:  A liver condition called cirrhosis. This is the most common cause of ascites.  Long-term (chronic) or alcoholic hepatitis.  Infection or inflammation in the abdomen.  Cancer in the abdomen.  Heart failure.  Kidney disease.  Inflammation of the pancreas.  Clots in the veins of the liver. What are the signs or symptoms? Symptoms of this condition include:  A feeling of fullness in the abdomen. This is common.  An increase in the size of the abdomen or waist.  Swelling in the legs.  Swelling of the scrotum (in men).  Difficulty breathing.  Pain in the abdomen.  Sudden weight gain. If the condition is mild, you may not have symptoms. How is this diagnosed? This condition is diagnosed based on your medical history and a physical exam. Your health care provider may order imaging tests, such as an ultrasound or CT scan of your abdomen. How is this treated? Treatment for this condition depends on the cause of the ascites. It may include:  Taking a pill to make you urinate. This is called a water pill (diuretic pill).  Strictly reducing your salt (sodium) intake. Salt can cause extra fluid to be kept (retained) in the body, and this makes ascites worse.  Having a procedure to remove fluid from your abdomen (paracentesis).  Having a procedure that connects two of the major veins within your liver and relieves pressure on your liver. This is called a TIPS procedure (transjugular intrahepatic portosystemic shunt procedure).  Placement of a drainage catheter (peritoneovenous shunt) to manage the extra fluid in the abdomen. Ascites may go away or improve when the condition that caused it is treated. Follow these instructions at home:  Keep track of your weight. To do this,  weigh yourself at the same time every day and write down your weight.  Keep track of how much you drink and any changes in how much or how often you urinate.  Follow any instructions that your health care provider gives you about how much to drink.  Try not to eat salty (high-sodium) foods.  Take over-the-counter and prescription medicines only as told by your health care provider.  Keep all follow-up visits as told by your health care provider. This is important.  Report any changes in your health to your health care provider, especially if you develop new symptoms or your symptoms get worse. Contact a health care provider if:  You gain more than 3 lb (1.36 kg) in 3 days.  Your waist size increases.  You have new swelling in your legs.  The swelling in your legs gets worse. Get help right away if:  You have a fever.  You are confused.  You have new or worsening breathing trouble.  You have new or worsening pain in your abdomen.  You have new or worsening swelling in the scrotum (in men). Summary  Ascites is a collection of too much fluid in the abdomen.  Ascites may be caused by various conditions, such as cirrhosis, hepatitis, cancer, or congestive heart failure.  Symptoms may include swelling of the abdomen and other areas due to extra fluid in the body.  Treatments may involve dietary changes, medicines, or procedures. This information is not intended to replace advice given to you by your health care  provider. Make sure you discuss any questions you have with your health care provider. Document Released: 12/02/2005 Document Revised: 11/03/2018 Document Reviewed: 08/14/2017 Elsevier Patient Education  2020 [employer].

## 2019-07-25 LAB — CULTURE, BLOOD (ROUTINE X 2)
Culture: NO GROWTH
Culture: NO GROWTH
Special Requests: ADEQUATE

## 2019-07-25 LAB — BODY FLUID CULTURE
Culture: NO GROWTH
Culture: NO GROWTH

## 2019-07-26 ENCOUNTER — Encounter: Payer: Self-pay | Admitting: Gastroenterology

## 2019-07-26 LAB — AEROBIC/ANAEROBIC CULTURE W GRAM STAIN (SURGICAL/DEEP WOUND): Culture: NO GROWTH

## 2019-07-26 LAB — SURGICAL PATHOLOGY

## 2019-07-27 ENCOUNTER — Encounter: Payer: Self-pay | Admitting: Gastroenterology

## 2019-07-28 LAB — LIPASE, FLUID: Lipase-Fluid: 22 U/L

## 2019-08-13 ENCOUNTER — Encounter: Payer: Self-pay | Admitting: *Deleted

## 2019-08-13 ENCOUNTER — Other Ambulatory Visit: Payer: Self-pay

## 2019-08-13 ENCOUNTER — Emergency Department
Admission: EM | Admit: 2019-08-13 | Discharge: 2019-08-13 | Disposition: A | Discharge status: Home / Self Care | Payer: No Typology Code available for payment source | Attending: Emergency Medicine | Admitting: Emergency Medicine

## 2019-08-13 DIAGNOSIS — Z859 Personal history of malignant neoplasm, unspecified: Secondary | ICD-10-CM | POA: Diagnosis not present

## 2019-08-13 DIAGNOSIS — K921 Melena: Secondary | ICD-10-CM | POA: Diagnosis present

## 2019-08-13 DIAGNOSIS — Z87891 Personal history of nicotine dependence: Secondary | ICD-10-CM | POA: Insufficient documentation

## 2019-08-13 DIAGNOSIS — E119 Type 2 diabetes mellitus without complications: Secondary | ICD-10-CM | POA: Insufficient documentation

## 2019-08-13 DIAGNOSIS — I10 Essential (primary) hypertension: Secondary | ICD-10-CM | POA: Diagnosis not present

## 2019-08-13 LAB — CBC WITH DIFFERENTIAL/PLATELET
Abs Immature Granulocytes: 0.02 10*3/uL (ref 0.00–0.07)
Basophils Absolute: 0 10*3/uL (ref 0.0–0.1)
Basophils Relative: 1 %
Eosinophils Absolute: 0.1 10*3/uL (ref 0.0–0.5)
Eosinophils Relative: 3 %
HCT: 28.7 % — ABNORMAL LOW (ref 39.0–52.0)
Hemoglobin: 9.4 g/dL — ABNORMAL LOW (ref 13.0–17.0)
Immature Granulocytes: 1 %
Lymphocytes Relative: 23 %
Lymphs Abs: 0.8 10*3/uL (ref 0.7–4.0)
MCH: 30.4 pg (ref 26.0–34.0)
MCHC: 32.8 g/dL (ref 30.0–36.0)
MCV: 92.9 fL (ref 80.0–100.0)
Monocytes Absolute: 0.4 10*3/uL (ref 0.1–1.0)
Monocytes Relative: 11 %
Neutro Abs: 2.2 10*3/uL (ref 1.7–7.7)
Neutrophils Relative %: 61 %
Platelets: 73 10*3/uL — ABNORMAL LOW (ref 150–400)
RBC: 3.09 MIL/uL — ABNORMAL LOW (ref 4.22–5.81)
RDW: 17.2 % — ABNORMAL HIGH (ref 11.5–15.5)
Smear Review: DECREASED
WBC: 3.5 10*3/uL — ABNORMAL LOW (ref 4.0–10.5)
nRBC: 0 % (ref 0.0–0.2)

## 2019-08-13 LAB — TYPE AND SCREEN
ABO/RH(D): O NEG
Antibody Screen: NEGATIVE

## 2019-08-13 LAB — COMPREHENSIVE METABOLIC PANEL
ALT: 32 U/L (ref 0–44)
AST: 43 U/L — ABNORMAL HIGH (ref 15–41)
Albumin: 3.8 g/dL (ref 3.5–5.0)
Alkaline Phosphatase: 78 U/L (ref 38–126)
Anion gap: 11 (ref 5–15)
BUN: 23 mg/dL (ref 8–23)
CO2: 26 mmol/L (ref 22–32)
Calcium: 9.3 mg/dL (ref 8.9–10.3)
Chloride: 99 mmol/L (ref 98–111)
Creatinine, Ser: 1.3 mg/dL — ABNORMAL HIGH (ref 0.61–1.24)
GFR calc Af Amer: >60 mL/min (ref >60)
GFR calc non Af Amer: 54 mL/min — ABNORMAL LOW (ref >60)
Glucose, Bld: 185 mg/dL — ABNORMAL HIGH (ref 70–99)
Potassium: 3.8 mmol/L (ref 3.5–5.1)
Sodium: 136 mmol/L (ref 135–145)
Total Bilirubin: 0.7 mg/dL (ref 0.3–1.2)
Total Protein: 7.6 g/dL (ref 6.5–8.1)

## 2019-08-13 LAB — GLUCOSE, CAPILLARY: Glucose-Capillary: 108 mg/dL — ABNORMAL HIGH (ref 70–99)

## 2019-08-13 LAB — CBC
HCT: 29.1 % — ABNORMAL LOW (ref 39.0–52.0)
Hemoglobin: 9.4 g/dL — ABNORMAL LOW (ref 13.0–17.0)
MCH: 30.2 pg (ref 26.0–34.0)
MCHC: 32.3 g/dL (ref 30.0–36.0)
MCV: 93.6 fL (ref 80.0–100.0)
Platelets: 79 10*3/uL — ABNORMAL LOW (ref 150–400)
RBC: 3.11 MIL/uL — ABNORMAL LOW (ref 4.22–5.81)
RDW: 17.2 % — ABNORMAL HIGH (ref 11.5–15.5)
WBC: 3 10*3/uL — ABNORMAL LOW (ref 4.0–10.5)
nRBC: 0 % (ref 0.0–0.2)

## 2019-08-13 NOTE — ED Notes (Signed)
Dr. Jimmye Norman at bedside

## 2019-08-13 NOTE — ED Triage Notes (Signed)
Patient states he was admitted on 07/19/2019 for a hemoglobin 6.0 and received 3 units of blood. Patient states he noticed his stool starting to darkened and become looser in the past 5 days.

## 2019-08-13 NOTE — ED Provider Notes (Signed)
[facility] Emergency Department Provider Note       Time seen: ----------------------------------------- 9:07 PM on 08/13/2019 -----------------------------------------   I have reviewed the triage vital signs and the nursing notes.  HISTORY   Chief Complaint GI Bleeding    HPI Joshua Berry is a 73 y.o. male with a history of arthritis, cancer, cirrhosis, diabetes, hypertension, anemia, thrombocytopenia, gastrointestinal bleeding who presents to the ED for black stools that have become looser over the past 5 days.  Patient was recently admitted for gastrointestinal bleeding and hemoglobin of 6.0.  He received 3 units of blood during that hospital stay.  He is concerned he may be bleeding again.  Past Medical History:  Diagnosis Date  . Arthritis   . Cancer (HCC)    melanoma, carcinoma  . Cirrhosis of liver ([place])   . Diabetes mellitus without complication ([place])   . Heart murmur   . Hypertension   . Iron deficiency anemia   . NASH (nonalcoholic steatohepatitis)   . Sleep apnea   . Spleen enlarged   . Thrombocytopenia [facility])     Patient Active Problem List   Diagnosis Date Noted  . GI bleed 07/19/2019  . Sepsis ([place]) 12/20/2018    Past Surgical History:  Procedure Laterality Date  . CHOLECYSTECTOMY    . COLONOSCOPY N/A 07/23/2019   Procedure: COLONOSCOPY;  Surgeon: Lin Landsman, MD;  Location: [facility] ENDOSCOPY;  Service: Gastroenterology;  Laterality: N/A;  . ESOPHAGOGASTRODUODENOSCOPY N/A 07/20/2019   Procedure: ESOPHAGOGASTRODUODENOSCOPY (EGD);  Surgeon: Lin Landsman, MD;  Location: [facility] ENDOSCOPY;  Service: Gastroenterology;  Laterality: N/A;  . SKIN BIOPSY    . TEE WITHOUT CARDIOVERSION N/A 12/23/2018   Procedure: TRANSESOPHAGEAL ECHOCARDIOGRAM (TEE);  Surgeon: Teodoro Spray, MD;  Location: [facility] ORS;  Service: Cardiovascular;  Laterality: N/A;    Allergies Lisinopril, Sulfamethoxazole, Camphor, Latex, and Nickel  Social  History Social History   Tobacco Use  . Smoking status: Former Research scientist (life sciences)  . Smokeless tobacco: Never Used  Substance Use Topics  . Alcohol use: Not Currently  . Drug use: Not Currently   Review of Systems Constitutional: Negative for fever. Cardiovascular: Negative for chest pain. Respiratory: Negative for shortness of breath. Gastrointestinal: Negative for abdominal pain, vomiting.  Positive for black stools Musculoskeletal: Negative for back pain. Skin: Negative for rash. Neurological: Negative for headaches, focal weakness or numbness.  All systems negative/normal/unremarkable except as stated in the HPI  ____________________________________________   PHYSICAL EXAM:  VITAL SIGNS: ED Triage Vitals  Enc Vitals Group     BP 08/13/19 1711 (!) 121/57     Pulse Rate 08/13/19 1711 65     Resp 08/13/19 1711 18     Temp 08/13/19 1711 98.5 F (36.9 C)     Temp Source 08/13/19 1711 Oral     SpO2 08/13/19 1711 98 %     Weight --      Height --      Head Circumference --      Peak Flow --      Pain Score 08/13/19 1715 0     Pain Loc --      Pain Edu? --      Excl. in [place]? --    Constitutional: Alert and oriented. Well appearing and in no distress. Eyes: Conjunctivae are normal. Normal extraocular movements. ENT      Head: Normocephalic and atraumatic.      Nose: No congestion/rhinnorhea.      Mouth/Throat: Mucous membranes are moist.  Neck: No stridor. Cardiovascular: Normal rate, regular rhythm. No murmurs, rubs, or gallops. Respiratory: Normal respiratory effort without tachypnea nor retractions. Breath sounds are clear and equal bilaterally. No wheezes/rales/rhonchi. Gastrointestinal: Soft and nontender. Normal bowel sounds Musculoskeletal: Nontender with normal range of motion in extremities. No lower extremity tenderness nor edema. Neurologic:  Normal speech and language. No gross focal neurologic deficits are appreciated.  Skin:  Skin is warm, dry and intact. No  rash noted. Psychiatric: Mood and affect are normal. Speech and behavior are normal.  ____________________________________________  ED COURSE:  As part of my medical decision making, I reviewed the following data within the [place] History obtained from family if available, nursing notes, old chart and ekg, as well as notes from prior ED visits. Patient presented for possible gastrointestinal bleeding, we will assess with labs as indicated at this time.   Procedures  Joshua Berry was evaluated in Emergency Department on 08/13/2019 for the symptoms described in the history of present illness. He was evaluated in the context of the global COVID-19 pandemic, which necessitated consideration that the patient might be at risk for infection with the SARS-CoV-2 virus that causes COVID-19. Institutional protocols and algorithms that pertain to the evaluation of patients at risk for COVID-19 are in a state of rapid change based on information released by regulatory bodies including the CDC and federal and state organizations. These policies and algorithms were followed during the patient's care in the ED.  ____________________________________________   LABS (pertinent positives/negatives)  Labs Reviewed  COMPREHENSIVE METABOLIC PANEL - Abnormal; Notable for the following components:      Result Value   Glucose, Bld 185 (*)    Creatinine, Ser 1.30 (*)    AST 43 (*)    GFR calc non Af Amer 54 (*)    All other components within normal limits  CBC - Abnormal; Notable for the following components:   WBC 3.0 (*)    RBC 3.11 (*)    Hemoglobin 9.4 (*)    HCT 29.1 (*)    RDW 17.2 (*)    Platelets 79 (*)    All other components within normal limits  CBC WITH DIFFERENTIAL/PLATELET - Abnormal; Notable for the following components:   WBC 3.5 (*)    RBC 3.09 (*)    Hemoglobin 9.4 (*)    HCT 28.7 (*)    RDW 17.2 (*)    Platelets 73 (*)    All other components within normal limits   CBG MONITORING, ED  TYPE AND SCREEN   ____________________________________________   DIFFERENTIAL DIAGNOSIS   Gastrointestinal bleeding, medication side effect, anemia  FINAL ASSESSMENT AND PLAN  Melena   Plan: The patient had presented for melena. Patient's labs did not reveal a significant decline in his hemoglobin.  We rechecked it here after 5 hours in the ER and it has not dropped any further.  I feel comp when he can wait as an outpatient for EGD, I will encourage outpatient medications as previously prescribed.   Laurence Aly, MD    Note: This note was generated in part or whole with voice recognition software. Voice recognition is usually quite accurate but there are transcription errors that can and very often do occur. I apologize for any typographical errors that were not detected and corrected.     Earleen Newport, MD 08/13/19 2236

## 2019-08-13 NOTE — ED Notes (Signed)
Assisted pt to restroom

## 2019-08-19 ENCOUNTER — Other Ambulatory Visit: Payer: Self-pay

## 2019-08-19 DIAGNOSIS — E785 Hyperlipidemia, unspecified: Secondary | ICD-10-CM | POA: Diagnosis present

## 2019-08-19 DIAGNOSIS — Z79890 Hormone replacement therapy (postmenopausal): Secondary | ICD-10-CM

## 2019-08-19 DIAGNOSIS — Z79899 Other long term (current) drug therapy: Secondary | ICD-10-CM

## 2019-08-19 DIAGNOSIS — K922 Gastrointestinal hemorrhage, unspecified: Principal | ICD-10-CM | POA: Diagnosis present

## 2019-08-19 DIAGNOSIS — Z87891 Personal history of nicotine dependence: Secondary | ICD-10-CM

## 2019-08-19 DIAGNOSIS — E039 Hypothyroidism, unspecified: Secondary | ICD-10-CM | POA: Diagnosis present

## 2019-08-19 DIAGNOSIS — Z20828 Contact with and (suspected) exposure to other viral communicable diseases: Secondary | ICD-10-CM | POA: Diagnosis present

## 2019-08-19 DIAGNOSIS — Z794 Long term (current) use of insulin: Secondary | ICD-10-CM

## 2019-08-19 DIAGNOSIS — Z91040 Latex allergy status: Secondary | ICD-10-CM

## 2019-08-19 DIAGNOSIS — Z882 Allergy status to sulfonamides status: Secondary | ICD-10-CM

## 2019-08-19 DIAGNOSIS — K766 Portal hypertension: Secondary | ICD-10-CM | POA: Diagnosis present

## 2019-08-19 DIAGNOSIS — K7581 Nonalcoholic steatohepatitis (NASH): Secondary | ICD-10-CM | POA: Diagnosis present

## 2019-08-19 DIAGNOSIS — R252 Cramp and spasm: Secondary | ICD-10-CM | POA: Diagnosis present

## 2019-08-19 DIAGNOSIS — D6959 Other secondary thrombocytopenia: Secondary | ICD-10-CM | POA: Diagnosis present

## 2019-08-19 DIAGNOSIS — Z85820 Personal history of malignant melanoma of skin: Secondary | ICD-10-CM

## 2019-08-19 DIAGNOSIS — Z91048 Other nonmedicinal substance allergy status: Secondary | ICD-10-CM

## 2019-08-19 DIAGNOSIS — G4733 Obstructive sleep apnea (adult) (pediatric): Secondary | ICD-10-CM | POA: Diagnosis present

## 2019-08-19 DIAGNOSIS — I8510 Secondary esophageal varices without bleeding: Secondary | ICD-10-CM | POA: Diagnosis present

## 2019-08-19 DIAGNOSIS — K921 Melena: Secondary | ICD-10-CM | POA: Diagnosis present

## 2019-08-19 DIAGNOSIS — K7460 Unspecified cirrhosis of liver: Secondary | ICD-10-CM | POA: Diagnosis present

## 2019-08-19 DIAGNOSIS — K31819 Angiodysplasia of stomach and duodenum without bleeding: Secondary | ICD-10-CM | POA: Diagnosis present

## 2019-08-19 DIAGNOSIS — I10 Essential (primary) hypertension: Secondary | ICD-10-CM | POA: Diagnosis present

## 2019-08-19 DIAGNOSIS — R188 Other ascites: Secondary | ICD-10-CM | POA: Diagnosis present

## 2019-08-19 DIAGNOSIS — Z9049 Acquired absence of other specified parts of digestive tract: Secondary | ICD-10-CM

## 2019-08-19 DIAGNOSIS — E119 Type 2 diabetes mellitus without complications: Secondary | ICD-10-CM | POA: Diagnosis present

## 2019-08-19 DIAGNOSIS — K3189 Other diseases of stomach and duodenum: Secondary | ICD-10-CM | POA: Diagnosis present

## 2019-08-19 DIAGNOSIS — Z888 Allergy status to other drugs, medicaments and biological substances status: Secondary | ICD-10-CM

## 2019-08-19 LAB — COMPREHENSIVE METABOLIC PANEL
ALT: 32 U/L (ref 0–44)
AST: 39 U/L (ref 15–41)
Albumin: 3.9 g/dL (ref 3.5–5.0)
Alkaline Phosphatase: 77 U/L (ref 38–126)
Anion gap: 10 (ref 5–15)
BUN: 28 mg/dL — ABNORMAL HIGH (ref 8–23)
CO2: 25 mmol/L (ref 22–32)
Calcium: 9.3 mg/dL (ref 8.9–10.3)
Chloride: 102 mmol/L (ref 98–111)
Creatinine, Ser: 1.21 mg/dL (ref 0.61–1.24)
GFR calc Af Amer: >60 mL/min (ref >60)
GFR calc non Af Amer: 59 mL/min — ABNORMAL LOW (ref >60)
Glucose, Bld: 265 mg/dL — ABNORMAL HIGH (ref 70–99)
Potassium: 4.1 mmol/L (ref 3.5–5.1)
Sodium: 137 mmol/L (ref 135–145)
Total Bilirubin: 0.5 mg/dL (ref 0.3–1.2)
Total Protein: 7.5 g/dL (ref 6.5–8.1)

## 2019-08-19 LAB — CBC
HCT: 26.6 % — ABNORMAL LOW (ref 39.0–52.0)
Hemoglobin: 8.8 g/dL — ABNORMAL LOW (ref 13.0–17.0)
MCH: 31 pg (ref 26.0–34.0)
MCHC: 33.1 g/dL (ref 30.0–36.0)
MCV: 93.7 fL (ref 80.0–100.0)
Platelets: 87 10*3/uL — ABNORMAL LOW (ref 150–400)
RBC: 2.84 MIL/uL — ABNORMAL LOW (ref 4.22–5.81)
RDW: 17 % — ABNORMAL HIGH (ref 11.5–15.5)
WBC: 4.3 10*3/uL (ref 4.0–10.5)
nRBC: 0 % (ref 0.0–0.2)

## 2019-08-19 LAB — LIPASE, BLOOD: Lipase: 75 U/L — ABNORMAL HIGH (ref 11–51)

## 2019-08-19 NOTE — ED Triage Notes (Signed)
Pt arrives to ED via POV from home with c/o "abdominal cramps" from the lower abdomen with radiation "all the way down my legs" that started at 845pm; no reports of cramping at this time. Pt denies CP; no SHOB. Pt denies any c/o fever; no N/V/D. Pt states EMS was called to his residence and told "everything looked okay", but sent here for blood work and further evaluation. Pt is A&O, in NAD; RR even, regular, and unlabored.

## 2019-08-20 ENCOUNTER — Encounter
Admission: EM | Disposition: A | Discharge status: Home / Self Care | Payer: Self-pay | Source: Home / Self Care | Attending: Internal Medicine

## 2019-08-20 ENCOUNTER — Inpatient Hospital Stay: Payer: Medicare Other | Admitting: Anesthesiology

## 2019-08-20 ENCOUNTER — Inpatient Hospital Stay
Admission: EM | Admit: 2019-08-20 | Discharge: 2019-08-22 | DRG: 378 | Disposition: A | Discharge status: Home / Self Care | Payer: Medicare Other | Attending: Internal Medicine | Admitting: Internal Medicine

## 2019-08-20 ENCOUNTER — Other Ambulatory Visit: Payer: Self-pay

## 2019-08-20 DIAGNOSIS — K766 Portal hypertension: Secondary | ICD-10-CM

## 2019-08-20 DIAGNOSIS — K7581 Nonalcoholic steatohepatitis (NASH): Secondary | ICD-10-CM | POA: Diagnosis present

## 2019-08-20 DIAGNOSIS — K921 Melena: Secondary | ICD-10-CM

## 2019-08-20 DIAGNOSIS — E785 Hyperlipidemia, unspecified: Secondary | ICD-10-CM | POA: Diagnosis present

## 2019-08-20 DIAGNOSIS — R188 Other ascites: Secondary | ICD-10-CM

## 2019-08-20 DIAGNOSIS — Z85820 Personal history of malignant melanoma of skin: Secondary | ICD-10-CM | POA: Diagnosis not present

## 2019-08-20 DIAGNOSIS — Z79899 Other long term (current) drug therapy: Secondary | ICD-10-CM | POA: Diagnosis not present

## 2019-08-20 DIAGNOSIS — K7460 Unspecified cirrhosis of liver: Secondary | ICD-10-CM | POA: Diagnosis present

## 2019-08-20 DIAGNOSIS — I8510 Secondary esophageal varices without bleeding: Secondary | ICD-10-CM | POA: Diagnosis present

## 2019-08-20 DIAGNOSIS — Z794 Long term (current) use of insulin: Secondary | ICD-10-CM | POA: Diagnosis not present

## 2019-08-20 DIAGNOSIS — K3189 Other diseases of stomach and duodenum: Secondary | ICD-10-CM | POA: Diagnosis present

## 2019-08-20 DIAGNOSIS — K7469 Other cirrhosis of liver: Secondary | ICD-10-CM | POA: Diagnosis not present

## 2019-08-20 DIAGNOSIS — K922 Gastrointestinal hemorrhage, unspecified: Secondary | ICD-10-CM | POA: Diagnosis present

## 2019-08-20 DIAGNOSIS — E039 Hypothyroidism, unspecified: Secondary | ICD-10-CM | POA: Diagnosis present

## 2019-08-20 DIAGNOSIS — R252 Cramp and spasm: Secondary | ICD-10-CM | POA: Diagnosis present

## 2019-08-20 DIAGNOSIS — I10 Essential (primary) hypertension: Secondary | ICD-10-CM | POA: Diagnosis present

## 2019-08-20 DIAGNOSIS — K31819 Angiodysplasia of stomach and duodenum without bleeding: Secondary | ICD-10-CM | POA: Diagnosis present

## 2019-08-20 DIAGNOSIS — G4733 Obstructive sleep apnea (adult) (pediatric): Secondary | ICD-10-CM | POA: Diagnosis present

## 2019-08-20 DIAGNOSIS — E119 Type 2 diabetes mellitus without complications: Secondary | ICD-10-CM | POA: Diagnosis present

## 2019-08-20 DIAGNOSIS — Z20828 Contact with and (suspected) exposure to other viral communicable diseases: Secondary | ICD-10-CM | POA: Diagnosis present

## 2019-08-20 DIAGNOSIS — Z9049 Acquired absence of other specified parts of digestive tract: Secondary | ICD-10-CM | POA: Diagnosis not present

## 2019-08-20 DIAGNOSIS — Z87891 Personal history of nicotine dependence: Secondary | ICD-10-CM | POA: Diagnosis not present

## 2019-08-20 DIAGNOSIS — Z888 Allergy status to other drugs, medicaments and biological substances status: Secondary | ICD-10-CM | POA: Diagnosis not present

## 2019-08-20 DIAGNOSIS — Z79890 Hormone replacement therapy (postmenopausal): Secondary | ICD-10-CM | POA: Diagnosis not present

## 2019-08-20 DIAGNOSIS — D6959 Other secondary thrombocytopenia: Secondary | ICD-10-CM | POA: Diagnosis present

## 2019-08-20 HISTORY — PX: ESOPHAGOGASTRODUODENOSCOPY: SHX5428

## 2019-08-20 LAB — CBC WITH DIFFERENTIAL/PLATELET
Abs Immature Granulocytes: 0.02 10*3/uL (ref 0.00–0.07)
Basophils Absolute: 0 10*3/uL (ref 0.0–0.1)
Basophils Relative: 1 %
Eosinophils Absolute: 0.1 10*3/uL (ref 0.0–0.5)
Eosinophils Relative: 3 %
HCT: 27.7 % — ABNORMAL LOW (ref 39.0–52.0)
Hemoglobin: 9 g/dL — ABNORMAL LOW (ref 13.0–17.0)
Immature Granulocytes: 1 %
Lymphocytes Relative: 25 %
Lymphs Abs: 0.7 10*3/uL (ref 0.7–4.0)
MCH: 30.1 pg (ref 26.0–34.0)
MCHC: 32.5 g/dL (ref 30.0–36.0)
MCV: 92.6 fL (ref 80.0–100.0)
Monocytes Absolute: 0.3 10*3/uL (ref 0.1–1.0)
Monocytes Relative: 9 %
Neutro Abs: 1.8 10*3/uL (ref 1.7–7.7)
Neutrophils Relative %: 61 %
Platelets: 72 10*3/uL — ABNORMAL LOW (ref 150–400)
RBC: 2.99 MIL/uL — ABNORMAL LOW (ref 4.22–5.81)
RDW: 16.9 % — ABNORMAL HIGH (ref 11.5–15.5)
WBC: 2.9 10*3/uL — ABNORMAL LOW (ref 4.0–10.5)
nRBC: 0 % (ref 0.0–0.2)

## 2019-08-20 LAB — FUNGUS CULTURE WITH STAIN

## 2019-08-20 LAB — COMPREHENSIVE METABOLIC PANEL
ALT: 30 U/L (ref 0–44)
AST: 38 U/L (ref 15–41)
Albumin: 3.4 g/dL — ABNORMAL LOW (ref 3.5–5.0)
Alkaline Phosphatase: 64 U/L (ref 38–126)
Anion gap: 10 (ref 5–15)
BUN: 27 mg/dL — ABNORMAL HIGH (ref 8–23)
CO2: 23 mmol/L (ref 22–32)
Calcium: 8.8 mg/dL — ABNORMAL LOW (ref 8.9–10.3)
Chloride: 106 mmol/L (ref 98–111)
Creatinine, Ser: 1.15 mg/dL (ref 0.61–1.24)
GFR calc Af Amer: >60 mL/min (ref >60)
GFR calc non Af Amer: >60 mL/min (ref >60)
Glucose, Bld: 184 mg/dL — ABNORMAL HIGH (ref 70–99)
Potassium: 4.2 mmol/L (ref 3.5–5.1)
Sodium: 139 mmol/L (ref 135–145)
Total Bilirubin: 0.9 mg/dL (ref 0.3–1.2)
Total Protein: 6.7 g/dL (ref 6.5–8.1)

## 2019-08-20 LAB — SARS CORONAVIRUS 2 BY RT PCR (HOSPITAL ORDER, PERFORMED IN ~~LOC~~ HOSPITAL LAB): SARS Coronavirus 2: NEGATIVE

## 2019-08-20 LAB — URINALYSIS, COMPLETE (UACMP) WITH MICROSCOPIC
Bacteria, UA: NONE SEEN
Bilirubin Urine: NEGATIVE
Glucose, UA: NEGATIVE mg/dL
Hgb urine dipstick: NEGATIVE
Ketones, ur: NEGATIVE mg/dL
Leukocytes,Ua: NEGATIVE
Nitrite: NEGATIVE
Protein, ur: NEGATIVE mg/dL
Specific Gravity, Urine: 1.017 (ref 1.005–1.030)
pH: 6 (ref 5.0–8.0)

## 2019-08-20 LAB — HEMOGLOBIN AND HEMATOCRIT, BLOOD
HCT: 26.3 % — ABNORMAL LOW (ref 39.0–52.0)
Hemoglobin: 8.8 g/dL — ABNORMAL LOW (ref 13.0–17.0)

## 2019-08-20 LAB — GLUCOSE, CAPILLARY
Glucose-Capillary: 164 mg/dL — ABNORMAL HIGH (ref 70–99)
Glucose-Capillary: 174 mg/dL — ABNORMAL HIGH (ref 70–99)
Glucose-Capillary: 181 mg/dL — ABNORMAL HIGH (ref 70–99)
Glucose-Capillary: 189 mg/dL — ABNORMAL HIGH (ref 70–99)
Glucose-Capillary: 189 mg/dL — ABNORMAL HIGH (ref 70–99)
Glucose-Capillary: 197 mg/dL — ABNORMAL HIGH (ref 70–99)

## 2019-08-20 LAB — PROTIME-INR
INR: 1.2 (ref 0.8–1.2)
Prothrombin Time: 15.3 seconds — ABNORMAL HIGH (ref 11.4–15.2)

## 2019-08-20 LAB — MRSA PCR SCREENING: MRSA by PCR: NEGATIVE

## 2019-08-20 LAB — FUNGAL ORGANISM REFLEX

## 2019-08-20 LAB — TSH: TSH: 2.426 u[IU]/mL (ref 0.350–4.500)

## 2019-08-20 LAB — FUNGUS CULTURE RESULT

## 2019-08-20 LAB — APTT: aPTT: 34 seconds (ref 24–36)

## 2019-08-20 LAB — MAGNESIUM: Magnesium: 1.9 mg/dL (ref 1.7–2.4)

## 2019-08-20 SURGERY — EGD (ESOPHAGOGASTRODUODENOSCOPY)
Anesthesia: General | Laterality: Left

## 2019-08-20 MED ORDER — SODIUM CHLORIDE 0.9 % IV SOLN: INTRAVENOUS | Status: DC

## 2019-08-20 MED ORDER — SODIUM CHLORIDE 0.9 % IV SOLN
50.0000 ug/h | INTRAVENOUS | Status: DC
  Administered 2019-08-20: 50 ug/h via INTRAVENOUS
  Filled 2019-08-20 (×5): qty 1

## 2019-08-20 MED ORDER — INSULIN ASPART 100 UNIT/ML ~~LOC~~ SOLN: 0.0000 [IU] | Freq: Every day | SUBCUTANEOUS | Status: DC

## 2019-08-20 MED ORDER — OCTREOTIDE LOAD VIA INFUSION
50.0000 ug | Freq: Once | INTRAVENOUS | Status: AC
  Administered 2019-08-20: 50 ug via INTRAVENOUS
  Filled 2019-08-20: qty 25

## 2019-08-20 MED ORDER — CHLORHEXIDINE GLUCONATE CLOTH 2 % EX PADS
6.0000 | MEDICATED_PAD | Freq: Every day | CUTANEOUS | Status: DC
  Administered 2019-08-20: 15:00:00 6 via TOPICAL

## 2019-08-20 MED ORDER — EPHEDRINE SULFATE 50 MG/ML IJ SOLN
INTRAMUSCULAR | Status: DC | PRN
  Administered 2019-08-20: 20 mg via INTRAVENOUS
  Administered 2019-08-20 (×2): 15 mg via INTRAVENOUS

## 2019-08-20 MED ORDER — SODIUM CHLORIDE 0.9 % IV BOLUS
500.0000 mL | Freq: Once | INTRAVENOUS | Status: AC
  Administered 2019-08-20: 500 mL via INTRAVENOUS

## 2019-08-20 MED ORDER — SODIUM CHLORIDE 0.9 % IV SOLN
INTRAVENOUS | Status: DC
  Administered 2019-08-20: 1000 mL via INTRAVENOUS
  Administered 2019-08-20 – 2019-08-22 (×4): via INTRAVENOUS

## 2019-08-20 MED ORDER — SODIUM CHLORIDE 0.9 % IV SOLN
10.0000 mL/h | Freq: Once | INTRAVENOUS | Status: AC
  Administered 2019-08-20: 10 mL/h via INTRAVENOUS

## 2019-08-20 MED ORDER — INSULIN ASPART 100 UNIT/ML ~~LOC~~ SOLN
0.0000 [IU] | SUBCUTANEOUS | Status: DC
  Administered 2019-08-20 – 2019-08-21 (×6): 2 [IU] via SUBCUTANEOUS
  Administered 2019-08-21: 3 [IU] via SUBCUTANEOUS
  Administered 2019-08-21: 1 [IU] via SUBCUTANEOUS
  Administered 2019-08-21: 3 [IU] via SUBCUTANEOUS
  Administered 2019-08-22: 2 [IU] via SUBCUTANEOUS
  Administered 2019-08-22: 1 [IU] via SUBCUTANEOUS
  Administered 2019-08-22: 2 [IU] via SUBCUTANEOUS
  Administered 2019-08-22: 04:00:00 1 [IU] via SUBCUTANEOUS
  Filled 2019-08-20 (×13): qty 1

## 2019-08-20 MED ORDER — SODIUM CHLORIDE 0.9 % IV SOLN
80.0000 mg | Freq: Once | INTRAVENOUS | Status: AC
  Administered 2019-08-20: 80 mg via INTRAVENOUS
  Filled 2019-08-20: qty 80

## 2019-08-20 MED ORDER — ONDANSETRON HCL 4 MG PO TABS: 4.0000 mg | ORAL_TABLET | Freq: Four times a day (QID) | ORAL | Status: DC | PRN

## 2019-08-20 MED ORDER — PROPOFOL 10 MG/ML IV BOLUS
INTRAVENOUS | Status: DC | PRN
  Administered 2019-08-20 (×2): 50 mg via INTRAVENOUS

## 2019-08-20 MED ORDER — SODIUM CHLORIDE 0.9 % IV SOLN
2.0000 g | Freq: Once | INTRAVENOUS | Status: AC
  Administered 2019-08-20: 2 g via INTRAVENOUS
  Filled 2019-08-20: qty 20

## 2019-08-20 MED ORDER — SODIUM CHLORIDE 0.9 % IV SOLN
8.0000 mg/h | INTRAVENOUS | Status: DC
  Administered 2019-08-20 – 2019-08-22 (×5): 8 mg/h via INTRAVENOUS
  Filled 2019-08-20 (×5): qty 80

## 2019-08-20 MED ORDER — ONDANSETRON HCL 4 MG/2ML IJ SOLN: 4.0000 mg | Freq: Four times a day (QID) | INTRAMUSCULAR | Status: DC | PRN

## 2019-08-20 MED ORDER — PANTOPRAZOLE SODIUM 40 MG IV SOLR: 40.0000 mg | Freq: Two times a day (BID) | INTRAVENOUS | Status: DC

## 2019-08-20 MED ORDER — SODIUM CHLORIDE 0.9 % IV SOLN
INTRAVENOUS | Status: DC
  Administered 2019-08-20: 14:00:00 via INTRAVENOUS

## 2019-08-20 MED ORDER — SODIUM CHLORIDE 0.9 % IV SOLN: 1.0000 g | Freq: Once | INTRAVENOUS | Status: DC

## 2019-08-20 MED ORDER — ACETAMINOPHEN 325 MG PO TABS
650.0000 mg | ORAL_TABLET | Freq: Four times a day (QID) | ORAL | Status: DC | PRN
  Administered 2019-08-22: 650 mg via ORAL
  Filled 2019-08-20: qty 2

## 2019-08-20 MED ORDER — SODIUM CHLORIDE 0.9 % IV SOLN
1.0000 g | INTRAVENOUS | Status: DC
  Administered 2019-08-21 – 2019-08-22 (×2): 1 g via INTRAVENOUS
  Filled 2019-08-20 (×2): qty 1
  Filled 2019-08-20: qty 10

## 2019-08-20 MED ORDER — PROPOFOL 500 MG/50ML IV EMUL
INTRAVENOUS | Status: DC | PRN
  Administered 2019-08-20: 130 ug/kg/min via INTRAVENOUS

## 2019-08-20 MED ORDER — ACETAMINOPHEN 650 MG RE SUPP: 650.0000 mg | Freq: Four times a day (QID) | RECTAL | Status: DC | PRN

## 2019-08-20 NOTE — ED Notes (Signed)
Attempted to call report, they will call back per floor.  Will inform RN charge.

## 2019-08-20 NOTE — ED Notes (Signed)
ED TO INPATIENT HANDOFF REPORT  ED Nurse Name and Phone #: Sam [phone]  S Name/Age/Gender Joshua Berry 73 y.o. male Room/Bed: ED14A/ED14A  Code Status   Code Status: Full Code  Home/SNF/Other Home Patient oriented to: self, place, time and situation Is this baseline? Yes   Triage Complete: Triage complete  Chief Complaint Abd cramps  Triage Note Pt arrives to ED via POV from home with c/o "abdominal cramps" from the lower abdomen with radiation "all the way down my legs" that started at 845pm; no reports of cramping at this time. Pt denies CP; no SHOB. Pt denies any c/o fever; no N/V/D. Pt states EMS was called to his residence and told "everything looked okay", but sent here for blood work and further evaluation. Pt is A&O, in NAD; RR even, regular, and unlabored.   Allergies Allergies  Allergen Reactions  . Lisinopril Hives  . Sulfamethoxazole Swelling  . Camphor Rash and Swelling  . Latex Rash  . Nickel Rash    Level of Care/Admitting Diagnosis ED Disposition    ED Disposition Condition [facility]: [place] [[id number]]  Level of Care: Stepdown [14]  Covid Evaluation: Asymptomatic Screening Protocol (No Symptoms)  Diagnosis: GI bleed [[id number]]  Admitting Physician: Harrie Foreman [[id number]]  Attending Physician: Harrie Foreman [[id number]]  Estimated length of stay: past midnight tomorrow  Certification:: I certify this patient will need inpatient services for at least 2 midnights  PT Class (Do Not Modify): Inpatient [101]  PT Acc Code (Do Not Modify): Private [1]       B Medical/Surgery History Past Medical History:  Diagnosis Date  . Arthritis   . Cancer (HCC)    melanoma, carcinoma  . Cirrhosis of liver ([place])   . Diabetes mellitus without complication ([place])   . Heart murmur   . Hypertension   . Iron deficiency anemia   . NASH (nonalcoholic steatohepatitis)   . Sleep apnea   . Spleen enlarged   .  Thrombocytopenia ([place])    Past Surgical History:  Procedure Laterality Date  . CHOLECYSTECTOMY    . COLONOSCOPY N/A 07/23/2019   Procedure: COLONOSCOPY;  Surgeon: Lin Landsman, MD;  Location: [facility] ENDOSCOPY;  Service: Gastroenterology;  Laterality: N/A;  . ESOPHAGOGASTRODUODENOSCOPY N/A 07/20/2019   Procedure: ESOPHAGOGASTRODUODENOSCOPY (EGD);  Surgeon: Lin Landsman, MD;  Location: [facility] ENDOSCOPY;  Service: Gastroenterology;  Laterality: N/A;  . SKIN BIOPSY    . TEE WITHOUT CARDIOVERSION N/A 12/23/2018   Procedure: TRANSESOPHAGEAL ECHOCARDIOGRAM (TEE);  Surgeon: Teodoro Spray, MD;  Location: [facility] ORS;  Service: Cardiovascular;  Laterality: N/A;     A IV Location/Drains/Wounds Patient Lines/Drains/Airways Status   Active Line/Drains/Airways    Name:   Placement date:   Placement time:   Site:   Days:   Peripheral IV 08/20/19 Left Forearm   08/20/19    0343    Forearm   less than 1   Peripheral IV 08/20/19 Left;Upper Forearm   08/20/19    0443    Forearm   less than 1   PICC Single Lumen 12/25/18 PICC Left Brachial 50 cm 0 cm   12/25/18    1454    Brachial   238   Incision (Closed) 07/21/19 Abdomen Lower;Right   07/21/19    1548     30          Intake/Output Last 24 hours  Intake/Output Summary (Last 24 hours) at 08/20/2019 1113 Last data filed  at 08/20/2019 0737 Gross per 24 hour  Intake 1130 ml  Output -  Net 1130 ml    Labs/Imaging Results for orders placed or performed during the hospital encounter of 08/20/19 (from the past 48 hour(s))  Lipase, blood     Status: Abnormal   Collection Time: 08/19/19 10:41 PM  Result Value Ref Range   Lipase 75 (H) 11 - 51 U/L    Comment: Performed at [facility], [address] [place], [place] [address]  Comprehensive metabolic panel     Status: Abnormal   Collection Time: 08/19/19 10:41 PM  Result Value Ref Range   Sodium 137 135 - 145 mmol/L   Potassium 4.1 3.5 - 5.1 mmol/L   Chloride 102 98 - 111 mmol/L   CO2  25 22 - 32 mmol/L   Glucose, Bld 265 (H) 70 - 99 mg/dL   BUN 28 (H) 8 - 23 mg/dL   Creatinine, Ser 1.21 0.61 - 1.24 mg/dL   Calcium 9.3 8.9 - 10.3 mg/dL   Total Protein 7.5 6.5 - 8.1 g/dL   Albumin 3.9 3.5 - 5.0 g/dL   AST 39 15 - 41 U/L   ALT 32 0 - 44 U/L   Alkaline Phosphatase 77 38 - 126 U/L   Total Bilirubin 0.5 0.3 - 1.2 mg/dL   GFR calc non Af Amer 59 (L) >60 mL/min   GFR calc Af Amer >60 >60 mL/min   Anion gap 10 5 - 15    Comment: Performed at [facility], [address] [place], [place] [address]  CBC     Status: Abnormal   Collection Time: 08/19/19 10:41 PM  Result Value Ref Range   WBC 4.3 4.0 - 10.5 K/uL   RBC 2.84 (L) 4.22 - 5.81 MIL/uL   Hemoglobin 8.8 (L) 13.0 - 17.0 g/dL   HCT 26.6 (L) 39.0 - 52.0 %   MCV 93.7 80.0 - 100.0 fL   MCH 31.0 26.0 - 34.0 pg   MCHC 33.1 30.0 - 36.0 g/dL   RDW 17.0 (H) 11.5 - 15.5 %   Platelets 87 (L) 150 - 400 K/uL    Comment: Immature Platelet Fraction may be clinically indicated, consider ordering this additional test [id number]    nRBC 0.0 0.0 - 0.2 %    Comment: Performed at [facility], [address] [place], [place] [address]  TSH     Status: None   Collection Time: 08/19/19 10:41 PM  Result Value Ref Range   TSH 2.426 0.350 - 4.500 uIU/mL    Comment: Performed by a 3rd Generation assay with a functional sensitivity of <=0.01 uIU/mL. Performed at [facility], [address] [place], [place] [address]   Type and screen     Status: None (Preliminary result)   Collection Time: 08/20/19  4:02 AM  Result Value Ref Range   ABO/RH(D) O NEG    Antibody Screen NEG    Sample Expiration 08/23/2019,2359    Unit Number [id number]    Blood Component Type RBC, LR IRR    Unit division 00    Status of Unit ISSUED    Transfusion Status OK TO TRANSFUSE    Crossmatch Result      Compatible Performed at [facility], [address] [place], [place] [address]    Unit Number  [id number]    Blood Component Type RED CELLS,LR    Unit division 00    Status of Unit ALLOCATED  Transfusion Status OK TO TRANSFUSE    Crossmatch Result Compatible   Protime-INR     Status: Abnormal   Collection Time: 08/20/19  4:02 AM  Result Value Ref Range   Prothrombin Time 15.3 (H) 11.4 - 15.2 seconds   INR 1.2 0.8 - 1.2    Comment: (NOTE) INR goal varies based on device and disease states. Performed at [facility], [address] [place], [place] [address]   APTT     Status: None   Collection Time: 08/20/19  4:02 AM  Result Value Ref Range   aPTT 34 24 - 36 seconds    Comment: Performed at [facility], [address] [place], [place] [address]  Prepare RBC     Status: None   Collection Time: 08/20/19  4:12 AM  Result Value Ref Range   Order Confirmation      ORDER PROCESSED BY BLOOD BANK Performed at [facility], [address] [place], [place] [address]   SARS Coronavirus 2 [facility] order, Performed in [facility] hospital lab) Nasopharyngeal Nasopharyngeal Swab     Status: None   Collection Time: 08/20/19  4:27 AM   Specimen: Nasopharyngeal Swab  Result Value Ref Range   SARS Coronavirus 2 NEGATIVE NEGATIVE    Comment: (NOTE) If result is NEGATIVE SARS-CoV-2 target nucleic acids are NOT DETECTED. The SARS-CoV-2 RNA is generally detectable in upper and lower  respiratory specimens during the acute phase of infection. The lowest  concentration of SARS-CoV-2 viral copies this assay can detect is 250  copies / mL. A negative result does not preclude SARS-CoV-2 infection  and should not be used as the sole basis for treatment or other  patient management decisions.  A negative result may occur with  improper specimen collection / handling, submission of specimen other  than nasopharyngeal swab, presence of viral mutation(s) within the  areas targeted by this assay, and inadequate number of viral copies  (<250 copies / mL). A  negative result must be combined with clinical  observations, patient history, and epidemiological information. If result is POSITIVE SARS-CoV-2 target nucleic acids are DETECTED. The SARS-CoV-2 RNA is generally detectable in upper and lower  respiratory specimens dur ing the acute phase of infection.  Positive  results are indicative of active infection with SARS-CoV-2.  Clinical  correlation with patient history and other diagnostic information is  necessary to determine patient infection status.  Positive results do  not rule out bacterial infection or co-infection with other viruses. If result is PRESUMPTIVE POSTIVE SARS-CoV-2 nucleic acids MAY BE PRESENT.   A presumptive positive result was obtained on the submitted specimen  and confirmed on repeat testing.  While 2019 novel coronavirus  (SARS-CoV-2) nucleic acids may be present in the submitted sample  additional confirmatory testing may be necessary for epidemiological  and / or clinical management purposes  to differentiate between  SARS-CoV-2 and other Sarbecovirus currently known to infect humans.  If clinically indicated additional testing with an alternate test  methodology [id number]) is advised. The SARS-CoV-2 RNA is generally  detectable in upper and lower respiratory sp ecimens during the acute  phase of infection. The expected result is Negative. Fact Sheet for Patients:  StrictlyIdeas.no Fact Sheet for Healthcare Providers: BankingDealers.co.za This test is not yet approved or cleared by the [place] FDA and has been authorized for detection and/or diagnosis of SARS-CoV-2 by FDA under an Emergency Use Authorization (EUA).  This EUA will remain in effect (meaning this test  can be used) for the duration of the COVID-19 declaration under Section 564(b)(1) of the Act, 21 U.S.C. section 360bbb-3(b)(1), unless the authorization is terminated or revoked sooner. Performed  at [facility], [address] [place], [place] [address]   Glucose, capillary     Status: Abnormal   Collection Time: 08/20/19  6:16 AM  Result Value Ref Range   Glucose-Capillary 181 (H) 70 - 99 mg/dL  Glucose, capillary     Status: Abnormal   Collection Time: 08/20/19  7:32 AM  Result Value Ref Range   Glucose-Capillary 174 (H) 70 - 99 mg/dL   No results found.  Pending Labs Unresulted Labs (From admission, onward)    Start     Ordered   08/19/19 2236  Urinalysis, Complete w Microscopic  ONCE - STAT,   STAT     08/19/19 2235          Vitals/Pain Today's Vitals   08/20/19 0815 08/20/19 0915 08/20/19 1000 08/20/19 1100  BP: (!) 120/59 126/64 (!) 113/59 115/64  Pulse: (!) 57 60 (!) 54 61  Resp: 15 16 14 14   Temp:      TempSrc:      SpO2: 94% 97% 94% 96%  Weight:      Height:      PainSc:        Isolation Precautions No active isolations  Medications Medications  pantoprazole (PROTONIX) 80 mg in sodium chloride 0.9 % 250 mL (0.32 mg/mL) infusion (8 mg/hr Intravenous New Bag/Given 08/20/19 0739)  pantoprazole (PROTONIX) injection 40 mg (has no administration in time range)  octreotide (SANDOSTATIN) 2 mcg/mL load via infusion 50 mcg (50 mcg Intravenous Bolus from Bag 08/20/19 0455)    And  octreotide (SANDOSTATIN) 500 mcg in sodium chloride 0.9 % 250 mL (2 mcg/mL) infusion (50 mcg/hr Intravenous New Bag/Given 08/20/19 0500)  acetaminophen (TYLENOL) tablet 650 mg (has no administration in time range)    Or  acetaminophen (TYLENOL) suppository 650 mg (has no administration in time range)  ondansetron (ZOFRAN) tablet 4 mg (has no administration in time range)    Or  ondansetron (ZOFRAN) injection 4 mg (has no administration in time range)  0.9 %  sodium chloride infusion (1,000 mLs Intravenous New Bag/Given 08/20/19 0735)  insulin aspart (novoLOG) injection 0-9 Units (2 Units Subcutaneous Given 08/20/19 0742)  insulin aspart (novoLOG) injection 0-5 Units (has no  administration in time range)  cefTRIAXone (ROCEPHIN) 1 g in sodium chloride 0.9 % 100 mL IVPB (has no administration in time range)  sodium chloride 0.9 % bolus 500 mL (0 mLs Intravenous Stopped 08/20/19 0430)  pantoprazole (PROTONIX) 80 mg in sodium chloride 0.9 % 100 mL IVPB (0 mg Intravenous Stopped 08/20/19 0517)  0.9 %  sodium chloride infusion (0 mL/hr Intravenous Stopped 08/20/19 0737)  cefTRIAXone (ROCEPHIN) 2 g in sodium chloride 0.9 % 100 mL IVPB (0 g Intravenous Stopped 08/20/19 0626)    Mobility walks Moderate fall risk   Focused Assessments other   R Recommendations: See Admitting Provider Note  Report given to:   Additional Notes:

## 2019-08-20 NOTE — Progress Notes (Signed)
Inpatient Diabetes Program Recommendations  AACE/ADA: New Consensus Statement on Inpatient Glycemic Control (2015)  Target Ranges:  Prepandial:   less than 140 mg/dL      Peak postprandial:   less than 180 mg/dL (1-2 hours)      Critically ill patients:  140 - 180 mg/dL   Lab Results  Component Value Date   GLUCAP 189 (H) 08/20/2019   HGBA1C 6.1 (H) 07/19/2019    Review of Glycemic Control Results for ADEEB, KONECNY (MRN [id number]) as of 08/20/2019 14:16  Ref. Range 08/20/2019 06:16 08/20/2019 07:32 08/20/2019 12:27  Glucose-Capillary Latest Ref Range: 70 - 99 mg/dL 181 (H) 174 (H) 189 (H)   Diabetes history: DM 2 Outpatient Diabetes medications: Novolog 70/30 mix 55 units bid Current orders for Inpatient glycemic control:  Novolog sensitive tid with meals and HS  Inpatient Diabetes Program Recommendations:    Note patient currently NPO.  Once diet restarted, consider resuming Novolog 70/30 20 units bid (less than 1/2 of home dose).    Thanks Adah Perl, RN, BC-ADM Inpatient Diabetes Coordinator Pager [phone] (8a-5p)

## 2019-08-20 NOTE — Progress Notes (Signed)
Initial Nutrition Assessment  DOCUMENTATION CODES:   Not applicable  INTERVENTION:   RD will order supplements once diet advanced  NUTRITION DIAGNOSIS:   Increased nutrient needs related to chronic illness(cirrhosis) as evidenced by increased estimated needs.  GOAL:   Patient will meet greater than or equal to 90% of their needs  MONITOR:   PO intake, Supplement acceptance, Labs, Weight trends, Skin, I & O's  REASON FOR ASSESSMENT:   Malnutrition Screening Tool    ASSESSMENT:   73 y.o. 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  Pt s/p EGD today; found to have varices and gastropathy   Unable to see patient today as pt in procedure. Pt currently NPO for GI work-up. RD will add supplements once diet advanced. Will obtain nutrition related history and exam at follow up. Per chart, pt appears to be down ~33lbs from his UBW; RD unsure if admit weight is correct.   Medications reviewed and include: insulin, protonic, NaCl @100ml /hr, ceftriaxone   Labs reviewed: Hgb 8.8(L), Hct 26.6(L) cbgs- 181, 174, 189 x 24 hrs AIC 6.1(H)- 8/3  Unable to complete Nutrition-Focused physical exam at this time.   Diet Order:   Diet Order            Diet NPO time specified  Diet effective midnight        Diet NPO time specified  Diet effective now             EDUCATION NEEDS:   No education needs have been identified at this time  Skin:  Skin Assessment: Reviewed RN Assessment  Last BM:  9/3  Height:   Ht Readings from Last 1 Encounters:  08/20/19 6' 2"  (1.88 m)    Weight:   Wt Readings from Last 1 Encounters:  08/20/19 102 kg    Ideal Body Weight:  86.3 kg  BMI:  Body mass index is 28.87 kg/m.  Estimated Nutritional Needs:   Kcal:  2300-2600kcal/day  Protein:  115-130g/day  Fluid:  >2.2L/day  Koleen Distance MS, RD, LDN Pager #- [phone] Office#- [phone] After Hours Pager: [phone]

## 2019-08-20 NOTE — Consult Note (Signed)
Name: Joshua Berry MRN: [id number] DOB: 1946/05/29    ADMISSION DATE:  08/20/2019 CONSULTATION DATE: 08/20/2019  REFERRING MD : Dr. Marcille Blanco  CHIEF COMPLAINT: Abdominal Pain   BRIEF PATIENT DESCRIPTION: 73 yo male with hx of chronic thrombocytopenia and cirrhosis of liver admitted with acute GI bleed s/p upper endoscopy which revealed spontaneous bleeding in the antrum treated with argon plasma coagulation   SIGNIFICANT EVENTS/STUDIES:  09/4-Pt admitted to the stepdown unit with acute GI bleed  09/4-Upper endoscopy revealed non-bleeding grade II esophageal varices. Portal hypertensive gastropathy. Friable (with spontaneous bleeding) and petechial mucosa in the antrum. Treated with argon plasma coagulation (APC). Normal examined duodenum. GI recommended he should be evaluated by his primary GI for TIPS, if he meets criteria, and referred for the same. The bleeding is not from his varices given no stigmata seen today and beta blocker therapy vs sequential banding vs TIPS to be discussed in clinic with primary GI  HISTORY OF PRESENT ILLNESS:   This is a 73 yo old male with a PMH of Thrombocytopenia, OSA, NASH, Iron Deficiency Anemia, HTN, Heart Murmur, Type II Diabetes Mellitus, Cirrhosis of Liver, Melanoma, and Arthritis.  He presented to [facility] ER via EMS on 09/4 with c/o abdominal wall and bilateral lower extremity muscle cramps and melena onset of symptoms 08/30.  He had similar symptoms 07/2019 and underwent an endoscopy which revealed a bleeding angiectasia.  He does not take anticoagulants, however he does endorse taking tramadol prn most recent dose 3 weeks ago.  In the ER lab results revealed glucose 265, BUN 28, lipase 75, hgb 8.8, hct 26.6, platelets 87, PT 15.3, INR 1.2, and COVID-19 negative.  Vital signs were stable bp 177/80, hr 53, rr 17, and O2 sats 100% on RA.  Pt received 1 unit pRBC's and iv ceftriaxone. Octreotide and Protonix gtts initiated, GI consulted.  Pt subsequently admitted  to the stepdown unit by hospitalist team for additional workup and treatment.     PAST MEDICAL HISTORY :   has a past medical history of Arthritis, Cancer ([place]), Cirrhosis of liver ([place]), Diabetes mellitus without complication ([place]), Heart murmur, Hypertension, Iron deficiency anemia, NASH (nonalcoholic steatohepatitis), Sleep apnea, Spleen enlarged, and Thrombocytopenia ([place]).  has a past surgical history that includes Skin biopsy; Cholecystectomy; TEE without cardioversion (N/A, 12/23/2018); Esophagogastroduodenoscopy (N/A, 07/20/2019); and Colonoscopy (N/A, 07/23/2019). Prior to Admission medications   Medication Sig Start Date End Date Taking? Authorizing Provider  Alogliptin Benzoate (NESINA) 12.5 MG TABS Take 12.5 mg by mouth daily.   Yes [provider]  Alpha-Lipoic Acid 100 MG CAPS Take 1 capsule by mouth daily.   Yes [provider]  atorvastatin (LIPITOR) 10 MG tablet Take 10 mg by mouth daily.   Yes [provider]  B Complex-C (B-COMPLEX WITH VITAMIN C) tablet Take 1 tablet by mouth daily.   Yes [provider]  cholecalciferol (VITAMIN D) 25 MCG (1000 UT) tablet Take 1,000 Units by mouth 2 (two) times daily.    Yes [provider]  folic acid (FOLVITE) 1 MG tablet Take 1 mg by mouth at bedtime.   Yes [provider]  furosemide (LASIX) 40 MG tablet Take 1 tablet (40 mg total) by mouth daily. 07/24/19  Yes Max Sane, MD  insulin aspart protamine- aspart (NOVOLOG MIX 70/30) (70-30) 100 UNIT/ML injection Inject 55 Units into the skin 2 (two) times daily with a meal.    Yes [provider]  ipratropium-albuterol (DUONEB) 0.5-2.5 (3) MG/3ML SOLN Take 3  mLs by nebulization every 4 (four) hours as needed (shortness of breath).    Yes [provider]  levothyroxine (SYNTHROID, LEVOTHROID) 100 MCG tablet Take 100 mcg by mouth daily before breakfast.   Yes [provider]  magnesium gluconate (MAGONATE) 500 MG tablet Take  500 mg by mouth daily.    Yes [provider]  Melatonin 10 MG TABS Take 10 mg by mouth at bedtime.   Yes [provider]  Omega-3 Fatty Acids (FISH OIL) 1000 MG CAPS Take 1 capsule by mouth at bedtime.    Yes [provider]  omeprazole (PRILOSEC) 40 MG capsule Take 40 mg by mouth daily.   Yes [provider]  Probiotic Product ([employer] ADV PROBIOTIC GUMMIES PO) Take 1 each by mouth daily.   Yes [provider]  spironolactone (ALDACTONE) 100 MG tablet Take 1 tablet (100 mg total) by mouth daily. 07/24/19  Yes Max Sane, MD  tamsulosin (FLOMAX) 0.4 MG CAPS capsule Take 0.4 mg by mouth daily.    Yes [provider]  terazosin (HYTRIN) 5 MG capsule Take 5 mg by mouth at bedtime.   Yes [provider]  traMADol (ULTRAM) 50 MG tablet Take 1 tablet (50 mg total) by mouth every 12 (twelve) hours as needed for moderate pain. 12/25/18  Yes Gouru, Illene Silver, MD  vitamin C (ASCORBIC ACID) 500 MG tablet Take 500 mg by mouth daily.   Yes [provider]  Zinc Sulfate 220 (50 Zn) MG TABS Take 110 mg by mouth every evening.    Yes [provider]  pantoprazole (PROTONIX) 40 MG tablet Take 1 tablet (40 mg total) by mouth 2 (two) times daily before a meal. Patient not taking: Reported on 08/20/2019 07/23/19   Max Sane, MD   Allergies  Allergen Reactions   Lisinopril Hives   Sulfamethoxazole Swelling   Camphor Rash and Swelling   Latex Rash   Nickel Rash    FAMILY HISTORY:  family history is not on file. SOCIAL HISTORY:  reports that he has quit smoking. He has never used smokeless tobacco. He reports previous alcohol use. He reports previous drug use.  REVIEW OF SYSTEMS: Positives in BOLD  Constitutional: Negative for fever, chills, weight loss, malaise/fatigue and diaphoresis.  HENT: Negative for hearing loss, ear pain, nosebleeds, congestion, sore throat, neck pain, tinnitus and ear discharge.   Eyes: Negative for blurred  vision, double vision, photophobia, pain, discharge and redness.  Respiratory: Negative for cough, hemoptysis, sputum production, shortness of breath, wheezing and stridor.   Cardiovascular: Negative for chest pain, palpitations, orthopnea, claudication, leg swelling and PND.  Gastrointestinal: heartburn, nausea, vomiting, abdominal cramping, diarrhea, constipation, blood in stool and melena.  Genitourinary: Negative for dysuria, urgency, frequency, hematuria and flank pain.  Musculoskeletal: bilateral lower extremity cramping, myalgias, back pain, joint pain and falls.  Skin: Negative for itching and rash.  Neurological: Negative for dizziness, tingling, tremors, sensory change, speech change, focal weakness, seizures, loss of consciousness, weakness and headaches.  Endo/Heme/Allergies: Negative for environmental allergies and polydipsia. Does not bruise/bleed easily.  SUBJECTIVE:  No complaints at this time  VITAL SIGNS: Temp:  [97.9 F (36.6 C)-98.2 F (36.8 C)] 97.9 F (36.6 C) (09/04 0733) Pulse Rate:  [44-71] 61 (09/04 1100) Resp:  [14-20] 14 (09/04 1100) BP: (96-205)/(51-88) 115/64 (09/04 1100) SpO2:  [94 %-100 %] 96 % (09/04 1100) Weight:  [110.7 kg] 110.7 kg (09/03 2234)  PHYSICAL EXAMINATION: General: well developed, well nourished male, NAD Neuro: alert and oriented,  follows commands  HEENT: supple, no JVD  Cardiovascular: sinus rhythm, rrr, no R/G Lungs: clear throughout, even, non labored  Abdomen: +BS x4, soft, non tender, non distended  Musculoskeletal: normal bulk and tone, trace bilateral lower extremity edema  Skin: intact no rashes or lesions present   Recent Labs  Lab 08/13/19 1736 08/19/19 2241  NA 136 137  K 3.8 4.1  CL 99 102  CO2 26 25  BUN 23 28*  CREATININE 1.30* 1.21  GLUCOSE 185* 265*   Recent Labs  Lab 08/13/19 1736 08/13/19 2120 08/19/19 2241  HGB 9.4* 9.4* 8.8*  HCT 29.1* 28.7* 26.6*  WBC 3.0* 3.5* 4.3  PLT 79* 73* 87*   No  results found.  ASSESSMENT / PLAN:  Acute GI Bleed s/p upper endoscopy which revealed spontaneous bleeding in the antrum treated with argon plasma coagulation  Chronic Thrombocytopenia in setting of decompensated cirrhosis of liver  Hx: Iron Deficiency Anemia and NASH Trend CBC  Monitor for s/sx of bleeding Transfuse for hgb <7 and/or active signs of bleeding  GI consulted appreciate input  Continue protonix gtt Per GI recommendations will discontinue octreotide gtt  Continue NS @100  ml/hr  Trend WBC and monitor fever curve  Continue ceftriaxone for SBP prophylaxis  Continuous telemetry monitoring  Supplemental O2 for dyspnea and/or hypoxia  Maintain map >65 with aggressive fluid resuscitation  Hold outpatient antihypertensives for now  Will start clear liquid diet   Type II Diabetes Mellitus  CBG's q4hrs  SSI   Marda Stalker, [place] Pager [phone] (please enter 7 digits) PCCM Consult Pager [phone] (please enter 7 digits)

## 2019-08-20 NOTE — ED Notes (Signed)
Pt continues to await hospital bed, pt still NPO as ordered by admitting MD.

## 2019-08-20 NOTE — Anesthesia Post-op Follow-up Note (Signed)
Anesthesia QCDR form completed.        

## 2019-08-20 NOTE — Anesthesia Preprocedure Evaluation (Addendum)
Anesthesia Evaluation  Patient identified by MRN, date of birth, ID band Patient awake    Reviewed: Allergy & Precautions, H&P , NPO status , Patient's Chart, lab work & pertinent test results  Airway Mallampati: III  TM Distance: >3 FB     Dental  (+) Loose, Chipped   Pulmonary sleep apnea and Continuous Positive Airway Pressure Ventilation , neg COPD, neg recent URI, former smoker,           Cardiovascular hypertension, (-) angina(-) Past MI (-) dysrhythmias      Neuro/Psych negative neurological ROS  negative psych ROS   GI/Hepatic negative GI ROS, (+) Cirrhosis  (NASH)      ,   Endo/Other  diabetes  Renal/GU negative Renal ROS  negative genitourinary   Musculoskeletal   Abdominal   Peds  Hematology  (+) Blood dyscrasia, anemia ,   Anesthesia Other Findings Past Medical History: No date: Arthritis No date: Cancer (HCC)     Comment:  melanoma, carcinoma No date: Cirrhosis of liver (HCC) No date: Diabetes mellitus without complication (HCC) No date: Heart murmur No date: Hypertension No date: Iron deficiency anemia No date: NASH (nonalcoholic steatohepatitis) No date: Sleep apnea No date: Spleen enlarged No date: Thrombocytopenia ([place])  Past Surgical History: No date: CHOLECYSTECTOMY 07/23/2019: COLONOSCOPY; N/A     Comment:  Procedure: COLONOSCOPY;  Surgeon: Lin Landsman,               MD;  Location: [facility] ENDOSCOPY;  Service:               Gastroenterology;  Laterality: N/A; 07/20/2019: ESOPHAGOGASTRODUODENOSCOPY; N/A     Comment:  Procedure: ESOPHAGOGASTRODUODENOSCOPY (EGD);  Surgeon:               Lin Landsman, MD;  Location: [facility] ENDOSCOPY;                Service: Gastroenterology;  Laterality: N/A; No date: SKIN BIOPSY 12/23/2018: TEE WITHOUT CARDIOVERSION; N/A     Comment:  Procedure: TRANSESOPHAGEAL ECHOCARDIOGRAM (TEE);                Surgeon: Teodoro Spray, MD;  Location: [facility]  ORS;                Service: Cardiovascular;  Laterality: N/A;  BMI    Body Mass Index: 28.87 kg/m      Reproductive/Obstetrics negative OB ROS                            Anesthesia Physical Anesthesia Plan  ASA: III  Anesthesia Plan: General   Post-op Pain Management:    Induction:   PONV Risk Score and Plan: Propofol infusion and TIVA  Airway Management Planned: Natural Airway and Nasal Cannula  Additional Equipment:   Intra-op Plan:   Post-operative Plan:   Informed Consent: I have reviewed the patients History and Physical, chart, labs and discussed the procedure including the risks, benefits and alternatives for the proposed anesthesia with the patient or authorized representative who has indicated his/her understanding and acceptance.     Dental Advisory Given  Plan Discussed with: Anesthesiologist and CRNA  Anesthesia Plan Comments:         Anesthesia Quick Evaluation

## 2019-08-20 NOTE — ED Notes (Signed)
Report to Sam, [place]

## 2019-08-20 NOTE — Op Note (Addendum)
[facility] Patient Name: Joshua Berry Procedure Date: 08/20/2019 1:17 PM MRN: [id number] Account #: [id number] Date of Birth: Oct 25, 1946 Admit Type: Outpatient Age: 73 Room: [facility] ENDO ROOM 2 Gender: Male Note Status: Finalized Procedure:            Upper GI endoscopy Indications:          Melena Providers:            Arzu Mcgaughey B. Bonna Gains MD, MD Medicines:            Monitored Anesthesia Care Complications:        No immediate complications. Procedure:            Pre-Anesthesia Assessment:                       - The risks and benefits of the procedure and the                        sedation options and risks were discussed with the                        patient. All questions were answered and informed                        consent was obtained.                       - Patient identification and proposed procedure were                        verified prior to the procedure.                       - ASA Grade Assessment: III - A patient with severe                        systemic disease.                       After obtaining informed consent, the endoscope was                        passed under direct vision. Throughout the procedure,                        the patient's blood pressure, pulse, and oxygen                        saturations were monitored continuously. The Endoscope                        was introduced through the mouth, and advanced to the                        second part of duodenum. The upper GI endoscopy was                        accomplished with ease. The patient tolerated the                        procedure well. Findings:      Two columns of  non-bleeding grade II varices were found in the distal       esophagus,. No stigmata of recent bleeding were evident and no red wale       signs were present.      Diffuse portal hypertensive gastropathy was found in the entire examined       stomach.      Patchy moderate  mucosal changes characterized by friability (with       spontaneous bleeding) and petechiae were found in the gastric antrum.       Coagulation for hemostasis using argon plasma was successful. 2 bleeding       spots cauterized. These were likely the same type of lesions cauterized       earlier in August. On washing these areas no lesions like true AVMs were       seen underneath the bleeding. The bleeding is likely from friable       mucosa/portal hypertensive gastropathy itself.      The examined duodenum was normal. Impression:           - Non-bleeding grade II esophageal varices.                       - Portal hypertensive gastropathy.                       - Friable (with spontaneous bleeding) and petechial                        mucosa in the antrum. Treated with argon plasma                        coagulation (APC).                       - Normal examined duodenum.                       - No specimens collected. Recommendation:       - Patient's anemia and melena that he presents to the                        hospital with is likely from the friable mucosa seen                        previously today. He should be evaluated by his primary                        GI for TIPS, if he meets criteria, and referred for the                        same. The bleeding is not from his varices given no                        stigmata seen today and beta blocker therapy vs                        sequential banding vs TIPS to be discussed in clinic                        with primary GI.                       -  Return to GI clinic in 2 weeks.                       - Continue Serial CBCs and transfuse PRN                       - Continue PPI BID. Avoid NSAIDs. d/c octreotide drip                       Clear liquid diet for one day and then advance to low                        salt diet if no bleeding.                       - The findings and recommendations were discussed with                         the patient. Procedure Code(s):    --- Professional ---                       [phone], Esophagogastroduodenoscopy, flexible, transoral;                        with control of bleeding, any method Diagnosis Code(s):    --- Professional ---                       I85.00, Esophageal varices without bleeding                       K76.6, Portal hypertension                       K31.89, Other diseases of stomach and duodenum                       K92.2, Gastrointestinal hemorrhage, unspecified                       K92.1, Melena (includes Hematochezia) CPT copyright 2019 American Medical Association. All rights reserved. The codes documented in this report are preliminary and upon coder review may  be revised to meet current compliance requirements.  Vonda Antigua, MD Margretta Sidle B. Bonna Gains MD, MD 08/20/2019 2:25:36 PM This report has been signed electronically. Number of Addenda: 0 Note Initiated On: 08/20/2019 1:17 PM Estimated Blood Loss: Estimated blood loss: none.      [facility]

## 2019-08-20 NOTE — Anesthesia Postprocedure Evaluation (Signed)
Anesthesia Post Note  [name]  Procedure(s) Performed: ESOPHAGOGASTRODUODENOSCOPY (EGD) (Left )  Patient location during evaluation: PACU Anesthesia Type: General Level of consciousness: awake and alert Pain management: pain level controlled Vital Signs Assessment: post-procedure vital signs reviewed and stable Respiratory status: spontaneous breathing, nonlabored ventilation and respiratory function stable Cardiovascular status: blood pressure returned to baseline and stable Postop Assessment: no apparent nausea or vomiting Anesthetic complications: no     Last Vitals:  Vitals:   08/20/19 1421 08/20/19 1441  BP: (!) 157/75 132/71  Pulse:    Resp:    Temp:    SpO2:      Last Pain:  Vitals:   08/20/19 1441  TempSrc:   PainSc: 0-No pain                 Durenda Hurt

## 2019-08-20 NOTE — Consult Note (Signed)
Vonda Antigua, MD [address] [place], [place], [place], [address] [address], [place], [place], [place], [address] Phone: [phone]  Fax: [phone]  Consultation  Referring Provider:     Dr. Margaretmary Eddy Primary Care Physician:  Lorelei Pont, MD Reason for Consultation:     Melena melena  Date of Admission:  08/20/2019 Date of Consultation:  08/20/2019         HPI:   Joshua Berry is a 73 y.o. male who [facility], with history of [name] cirrhosis, recently seen by Dr. Marius Ditch for melena, anemia as an inpatient, and underwent EGD and colonoscopy on August 4 and 7, 2020.  EGD Findings:      A single 5 mm angioectasia without bleeding was found in the second       portion of the duodenum. Coagulation for hemostasis using argon plasma       was successful.      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 Coagulation for hemostasis using argon plasma was successful.       Estimated blood loss: none.      The cardia and gastric fundus were normal on retroflexion.      Three columns of non-bleeding large (> 5 mm) varices were found in the       middle third of the esophagus and in the lower third of the esophagus,.       No stigmata of recent bleeding were evident and no red wale signs were       present. Scarring from prior treatment was visible. Evidence of partial       eradication was visible.  Colonoscopy findings: Findings:      Hemorrhoids were found on perianal exam.      The terminal ileum appeared normal.      Three sessile polyps were found in the transverse colon and ascending       colon. The polyps were 5 mm in size. These polyps were removed with a       cold snare. Resection and retrieval were complete.      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      Non-bleeding external and internal hemorrhoids were found during       retroflexion. The hemorrhoids  were medium-sized.  Patient returns to the hospital with 1 week history of melena.  No nausea or vomiting.  No abdominal pain.  No hematochezia.  Past Medical History:  Diagnosis Date  . Arthritis   . Cancer (HCC)    melanoma, carcinoma  . Cirrhosis of liver ([place])   . Diabetes mellitus without complication ([place])   . Heart murmur   . Hypertension   . Iron deficiency anemia   . NASH (nonalcoholic steatohepatitis)   . Sleep apnea   . Spleen enlarged   . Thrombocytopenia ([place])     Past Surgical History:  Procedure Laterality Date  . CHOLECYSTECTOMY    . COLONOSCOPY N/A 07/23/2019   Procedure: COLONOSCOPY;  Surgeon: Lin Landsman, MD;  Location[facility] ENDOSCOPY;  Service: Gastroenterology;  Laterality: N/A;  . ESOPHAGOGASTRODUODENOSCOPY N/A 07/20/2019   Procedure: ESOPHAGOGASTRODUODENOSCOPY (EGD);  Surgeon: Lin Landsman, MD;  Location: [facility] ENDOSCOPY;  Service: Gastroenterology;  Laterality: N/A;  . SKIN BIOPSY    . TEE WITHOUT CARDIOVERSION N/A 12/23/2018   Procedure: TRANSESOPHAGEAL ECHOCARDIOGRAM (TEE);  Surgeon:  Teodoro Spray, MD;  Location: [facility] ORS;  Service: Cardiovascular;  Laterality: N/A;    Prior to Admission medications   Medication Sig Start Date End Date Taking? Authorizing Provider  Alogliptin Benzoate (NESINA) 12.5 MG TABS Take 12.5 mg by mouth daily.   Yes [provider]  Alpha-Lipoic Acid 100 MG CAPS Take 1 capsule by mouth daily.   Yes [provider]  atorvastatin (LIPITOR) 10 MG tablet Take 10 mg by mouth daily.   Yes [provider]  B Complex-C (B-COMPLEX WITH VITAMIN C) tablet Take 1 tablet by mouth daily.   Yes [provider]  cholecalciferol (VITAMIN D) 25 MCG (1000 UT) tablet Take 1,000 Units by mouth 2 (two) times daily.    Yes [provider]  folic acid (FOLVITE) 1 MG tablet Take 1 mg by mouth at bedtime.   Yes [provider]  furosemide (LASIX) 40 MG tablet Take 1 tablet (40 mg total) by  mouth daily. 07/24/19  Yes Max Sane, MD  insulin aspart protamine- aspart (NOVOLOG MIX 70/30) (70-30) 100 UNIT/ML injection Inject 55 Units into the skin 2 (two) times daily with a meal.    Yes [provider]  ipratropium-albuterol (DUONEB) 0.5-2.5 (3) MG/3ML SOLN Take 3 mLs by nebulization every 4 (four) hours as needed (shortness of breath).    Yes [provider]  levothyroxine (SYNTHROID, LEVOTHROID) 100 MCG tablet Take 100 mcg by mouth daily before breakfast.   Yes [provider]  magnesium gluconate (MAGONATE) 500 MG tablet Take 500 mg by mouth daily.    Yes [provider]  Melatonin 10 MG TABS Take 10 mg by mouth at bedtime.   Yes [provider]  Omega-3 Fatty Acids (FISH OIL) 1000 MG CAPS Take 1 capsule by mouth at bedtime.    Yes [provider]  omeprazole (PRILOSEC) 40 MG capsule Take 40 mg by mouth daily.   Yes [provider]  Probiotic Product ([employer] ADV PROBIOTIC GUMMIES PO) Take 1 each by mouth daily.   Yes [provider]  spironolactone (ALDACTONE) 100 MG tablet Take 1 tablet (100 mg total) by mouth daily. 07/24/19  Yes Max Sane, MD  tamsulosin (FLOMAX) 0.4 MG CAPS capsule Take 0.4 mg by mouth daily.    Yes [provider]  terazosin (HYTRIN) 5 MG capsule Take 5 mg by mouth at bedtime.   Yes [provider]  traMADol (ULTRAM) 50 MG tablet Take 1 tablet (50 mg total) by mouth every 12 (twelve) hours as needed for moderate pain. 12/25/18  Yes Gouru, Illene Silver, MD  vitamin C (ASCORBIC ACID) 500 MG tablet Take 500 mg by mouth daily.   Yes [provider]  Zinc Sulfate 220 (50 Zn) MG TABS Take 110 mg by mouth every evening.    Yes [provider]  pantoprazole (PROTONIX) 40 MG tablet Take 1 tablet (40 mg total) by mouth 2 (two) times daily before a meal. Patient not taking: Reported on 08/20/2019 07/23/19   Max Sane, MD    History reviewed. No pertinent family history.   Social  History   Tobacco Use  . Smoking status: Former Research scientist (life sciences)  . Smokeless tobacco: Never Used  Substance Use Topics  . Alcohol use: Not Currently  . Drug use: Not Currently    Allergies as of 08/19/2019 - Review Complete 08/19/2019  Allergen Reaction Noted  . Lisinopril Hives 09/09/2017  . Sulfamethoxazole Swelling 09/16/2012  . Camphor Rash and Swelling 11/17/2012  . Latex Rash  09/16/2012  . Nickel Rash 11/17/2012    Review of Systems:    All systems reviewed and negative except where noted in HPI.   Physical Exam:  Vital signs in last 24 hours: Vitals:   08/20/19 0915 08/20/19 1000 08/20/19 1100 08/20/19 1224  BP: 126/64 (!) 113/59 115/64 134/68  Pulse: 60 (!) 54 61 65  Resp: 16 14 14 19   Temp:    97.9 F (36.6 C)  TempSrc:    Oral  SpO2: 97% 94% 96% 98%  Weight:    102 kg  Height:    6' 2"  (1.88 m)   Last BM Date: 08/19/19 General:   Pleasant, cooperative in NAD Head:  Normocephalic and atraumatic. Eyes:   No icterus.   Conjunctiva pink. PERRLA. Ears:  Normal auditory acuity. Neck:  Supple; no masses or thyroidomegaly Lungs: Respirations even and unlabored. Lungs clear to auscultation bilaterally.   No wheezes, crackles, or rhonchi.  Abdomen:  Soft, nondistended, nontender. Normal bowel sounds. No appreciable masses or hepatomegaly.  No rebound or guarding.  Neurologic:  Alert and oriented x3;  grossly normal neurologically. Skin:  Intact without significant lesions or rashes. Cervical Nodes:  No significant cervical adenopathy. Psych:  Alert and cooperative. Normal affect.  LAB RESULTS: Recent Labs    08/19/19 2241  WBC 4.3  HGB 8.8*  HCT 26.6*  PLT 87*   BMET Recent Labs    08/19/19 2241  NA 137  K 4.1  CL 102  CO2 25  GLUCOSE 265*  BUN 28*  CREATININE 1.21  CALCIUM 9.3   LFT Recent Labs    08/19/19 2241  PROT 7.5  ALBUMIN 3.9  AST 39  ALT 32  ALKPHOS 77  BILITOT 0.5   PT/INR Recent Labs    08/20/19 0402  LABPROT 15.3*  INR 1.2     STUDIES: No results found.    Impression / Plan:   Joshua Berry is a 73 y.o. 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  PPI IV twice daily  Octreotide drip Continue serial CBCs and transfuse PRN Avoid NSAIDs Maintain 2 large-bore IV lines Please page GI with any acute hemodynamic changes, or signs of active GI bleeding   Thank you for involving me in the care of this patient.      LOS: 0 days   Virgel Manifold, MD  08/20/2019, 1:20 PM

## 2019-08-20 NOTE — H&P (Signed)
Joshua Berry is an 73 y.o. male.   Chief Complaint: Abdominal pain HPI: The patient with past medical history of cirrhosis of the liver, diabetes, hypertension and thrombocytopenia presents to the emergency department complaining of abdominal pain.  The patient is seen dark-colored stools for several days.  He was admitted to the hospital 2 months ago for GI bleeding and found to have a bleeding ectatic vessel in his duodenum.  Since that time the patient has been well until today.  In the emergency department he was started on Protonix drip as well as octreotide prior to the emergency department staff, hospitalist service for admission.  Past Medical History:  Diagnosis Date  . Arthritis   . Cancer (HCC)    melanoma, carcinoma  . Cirrhosis of liver ([place])   . Diabetes mellitus without complication ([place])   . Heart murmur   . Hypertension   . Iron deficiency anemia   . NASH (nonalcoholic steatohepatitis)   . Sleep apnea   . Spleen enlarged   . Thrombocytopenia ([place])     Past Surgical History:  Procedure Laterality Date  . CHOLECYSTECTOMY    . COLONOSCOPY N/A 07/23/2019   Procedure: COLONOSCOPY;  Surgeon: Lin Landsman, MD;  Location: [facility] ENDOSCOPY;  Service: Gastroenterology;  Laterality: N/A;  . ESOPHAGOGASTRODUODENOSCOPY N/A 07/20/2019   Procedure: ESOPHAGOGASTRODUODENOSCOPY (EGD);  Surgeon: Lin Landsman, MD;  Location: [facility] ENDOSCOPY;  Service: Gastroenterology;  Laterality: N/A;  . SKIN BIOPSY    . TEE WITHOUT CARDIOVERSION N/A 12/23/2018   Procedure: TRANSESOPHAGEAL ECHOCARDIOGRAM (TEE);  Surgeon: Teodoro Spray, MD;  Location: [facility] ORS;  Service: Cardiovascular;  Laterality: N/A;    No family history on file. Social History:  reports that he has quit smoking. He has never used smokeless tobacco. He reports previous alcohol use. He reports previous drug use.  Allergies:  Allergies  Allergen Reactions  . Lisinopril Hives  . Sulfamethoxazole Swelling  . Camphor Rash  and Swelling  . Latex Rash  . Nickel Rash    Prior to Admission medications   Medication Sig Start Date End Date Taking? Authorizing Provider  Alogliptin Benzoate (NESINA) 12.5 MG TABS Take 12.5 mg by mouth daily.   Yes [provider]  Alpha-Lipoic Acid 100 MG CAPS Take 1 capsule by mouth daily.   Yes [provider]  atorvastatin (LIPITOR) 10 MG tablet Take 10 mg by mouth daily.   Yes [provider]  B Complex-C (B-COMPLEX WITH VITAMIN C) tablet Take 1 tablet by mouth daily.   Yes [provider]  cholecalciferol (VITAMIN D) 25 MCG (1000 UT) tablet Take 1,000 Units by mouth 2 (two) times daily.    Yes [provider]  folic acid (FOLVITE) 1 MG tablet Take 1 mg by mouth at bedtime.   Yes [provider]  furosemide (LASIX) 40 MG tablet Take 1 tablet (40 mg total) by mouth daily. 07/24/19  Yes Max Sane, MD  insulin aspart protamine- aspart (NOVOLOG MIX 70/30) (70-30) 100 UNIT/ML injection Inject 55 Units into the skin 2 (two) times daily with a meal.    Yes [provider]  ipratropium-albuterol (DUONEB) 0.5-2.5 (3) MG/3ML SOLN Take 3 mLs by nebulization every 4 (four) hours as needed (shortness of breath).    Yes [provider]  levothyroxine (SYNTHROID, LEVOTHROID) 100 MCG tablet Take 100 mcg by mouth daily before breakfast.   Yes [provider]  magnesium gluconate (MAGONATE) 500 MG tablet Take 500 mg by mouth daily.  Yes [provider]  Melatonin 10 MG TABS Take 10 mg by mouth at bedtime.   Yes [provider]  Omega-3 Fatty Acids (FISH OIL) 1000 MG CAPS Take 1 capsule by mouth at bedtime.    Yes [provider]  omeprazole (PRILOSEC) 40 MG capsule Take 40 mg by mouth daily.   Yes [provider]  Probiotic Product ([employer] ADV PROBIOTIC GUMMIES PO) Take 1 each by mouth daily.   Yes [provider]  spironolactone (ALDACTONE) 100 MG tablet Take 1 tablet (100 mg  total) by mouth daily. 07/24/19  Yes Max Sane, MD  tamsulosin (FLOMAX) 0.4 MG CAPS capsule Take 0.4 mg by mouth daily.    Yes [provider]  terazosin (HYTRIN) 5 MG capsule Take 5 mg by mouth at bedtime.   Yes [provider]  traMADol (ULTRAM) 50 MG tablet Take 1 tablet (50 mg total) by mouth every 12 (twelve) hours as needed for moderate pain. 12/25/18  Yes Gouru, Illene Silver, MD  vitamin C (ASCORBIC ACID) 500 MG tablet Take 500 mg by mouth daily.   Yes [provider]  Zinc Sulfate 220 (50 Zn) MG TABS Take 110 mg by mouth every evening.    Yes [provider]  pantoprazole (PROTONIX) 40 MG tablet Take 1 tablet (40 mg total) by mouth 2 (two) times daily before a meal. Patient not taking: Reported on 08/20/2019 07/23/19   Max Sane, MD     Results for orders placed or performed during the hospital encounter of 08/20/19 (from the past 48 hour(s))  Lipase, blood     Status: Abnormal   Collection Time: 08/19/19 10:41 PM  Result Value Ref Range   Lipase 75 (H) 11 - 51 U/L    Comment: Performed at [facility], [address] [place], [place] [address]  Comprehensive metabolic panel     Status: Abnormal   Collection Time: 08/19/19 10:41 PM  Result Value Ref Range   Sodium 137 135 - 145 mmol/L   Potassium 4.1 3.5 - 5.1 mmol/L   Chloride 102 98 - 111 mmol/L   CO2 25 22 - 32 mmol/L   Glucose, Bld 265 (H) 70 - 99 mg/dL   BUN 28 (H) 8 - 23 mg/dL   Creatinine, Ser 1.21 0.61 - 1.24 mg/dL   Calcium 9.3 8.9 - 10.3 mg/dL   Total Protein 7.5 6.5 - 8.1 g/dL   Albumin 3.9 3.5 - 5.0 g/dL   AST 39 15 - 41 U/L   ALT 32 0 - 44 U/L   Alkaline Phosphatase 77 38 - 126 U/L   Total Bilirubin 0.5 0.3 - 1.2 mg/dL   GFR calc non Af Amer 59 (L) >60 mL/min   GFR calc Af Amer >60 >60 mL/min   Anion gap 10 5 - 15    Comment: Performed at [facility], [address] [place], [place] [address]  CBC     Status: Abnormal   Collection Time: 08/19/19 10:41 PM   Result Value Ref Range   WBC 4.3 4.0 - 10.5 K/uL   RBC 2.84 (L) 4.22 - 5.81 MIL/uL   Hemoglobin 8.8 (L) 13.0 - 17.0 g/dL   HCT 26.6 (L) 39.0 - 52.0 %   MCV 93.7 80.0 - 100.0 fL   MCH 31.0 26.0 - 34.0 pg   MCHC 33.1 30.0 - 36.0 g/dL   RDW 17.0 (H) 11.5 - 15.5 %   Platelets 87 (L) 150 - 400 K/uL  Comment: Immature Platelet Fraction may be clinically indicated, consider ordering this additional test [id number]    nRBC 0.0 0.0 - 0.2 %    Comment: Performed at [facility], [address] [place], [place] [address]  Type and screen     Status: None (Preliminary result)   Collection Time: 08/20/19  4:02 AM  Result Value Ref Range   ABO/RH(D) O NEG    Antibody Screen NEG    Sample Expiration      08/23/2019,2359 Performed at [facility], [address], [place], [place] [address]    Unit Number [id number]    Blood Component Type RBC, LR IRR    Unit division 00    Status of Unit ALLOCATED    Transfusion Status OK TO TRANSFUSE    Crossmatch Result Compatible    Unit Number [id number]    Blood Component Type RED CELLS,LR    Unit division 00    Status of Unit ALLOCATED    Transfusion Status OK TO TRANSFUSE    Crossmatch Result Compatible   Protime-INR     Status: Abnormal   Collection Time: 08/20/19  4:02 AM  Result Value Ref Range   Prothrombin Time 15.3 (H) 11.4 - 15.2 seconds   INR 1.2 0.8 - 1.2    Comment: (NOTE) INR goal varies based on device and disease states. Performed at [facility], [address] [place], [place] [address]   APTT     Status: None   Collection Time: 08/20/19  4:02 AM  Result Value Ref Range   aPTT 34 24 - 36 seconds    Comment: Performed at [facility], [address] [place], [place] [address]  Prepare RBC     Status: None   Collection Time: 08/20/19  4:12 AM  Result Value Ref Range   Order Confirmation      ORDER PROCESSED BY BLOOD BANK Performed at [facility] [address] [place], [place] [address]    No results found.  Review of Systems  Constitutional: Negative for chills and fever.  HENT: Negative for sore throat and tinnitus.   Eyes: Negative for blurred vision and redness.  Respiratory: Negative for cough and shortness of breath.   Cardiovascular: Negative for chest pain, palpitations, orthopnea and PND.  Gastrointestinal: Positive for abdominal pain and melena. Negative for diarrhea, nausea and vomiting.  Genitourinary: Negative for dysuria, frequency and urgency.  Musculoskeletal: Negative for joint pain and myalgias.  Skin: Negative for rash.       No lesions  Neurological: Negative for speech change, focal weakness and weakness.  Endo/Heme/Allergies: Does not bruise/bleed easily.       No temperature intolerance  Psychiatric/Behavioral: Negative for depression and suicidal ideas.    Blood pressure (!) 177/80, pulse (!) 53, temperature 98.2 F (36.8 C), temperature source Oral, resp. rate 17, height 6' 2"  (1.88 m), weight 110.7 kg, SpO2 100 %. Physical Exam  Vitals reviewed. Constitutional: He is oriented to person, place, and time. He appears well-developed and well-nourished. No distress.  HENT:  Head: Normocephalic and atraumatic.  Mouth/Throat: Oropharynx is clear and moist.  Eyes: Pupils are equal, round, and reactive to light. Conjunctivae and EOM are normal. No scleral icterus.  Neck: Normal range of motion. Neck supple. No JVD present. No tracheal deviation present. No thyromegaly present.  Cardiovascular: Normal rate, regular rhythm and normal heart sounds. Exam reveals no gallop and no friction rub.  No murmur heard. Respiratory: Effort normal and  breath sounds normal. No respiratory distress.  GI: Soft. Bowel sounds are normal. He exhibits no distension. There is no abdominal tenderness.  Genitourinary:    Genitourinary Comments: Deferred   Musculoskeletal: Normal range of motion.        General: No edema.   Lymphadenopathy:    He has no cervical adenopathy.  Neurological: He is alert and oriented to person, place, and time. No cranial nerve deficit.  Skin: Skin is warm and dry. No erythema.  Psychiatric: He has a normal mood and affect. His behavior is normal. Judgment and thought content normal.     Assessment/Plan This is a 73 year old male admitted for GI bleeding. 1.  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  Consult gastroenterology for EGD.  Continue Protonix drip and octreotide. 2.  Cirrhosis: With ascites; check coags.  Continue Lasix and spironolactone.  Patient on beta-blocker for risk reduction of variceal bleeding 3.  Hypertension: Uncontrolled.  Continue terazosin.  Call as needed. 4.  Diabetes mellitus type 2: Resume basal insulin as well as sliding scale once pharmacy reconciliation is complete 5.  Hyperlipidemia: Continue statin therapy 6.  Hypothyroidism: Check TSH; continue Synthroid 7.  DVT prophylaxis: SCDs 8.  GI prophylaxis: As above The patient is a full code.  Time spent on admission orders and patient care approximately 45 minutes  Harrie Foreman, MD 08/20/2019, 5:16 AM

## 2019-08-20 NOTE — Transfer of Care (Signed)
Immediate Anesthesia Transfer of Care Note  [name]  Procedure(s) Performed: ESOPHAGOGASTRODUODENOSCOPY (EGD) (Left )  Patient Location: Endoscopy Unit  Anesthesia Type:General  Level of Consciousness: drowsy and patient cooperative  Airway & Oxygen Therapy: Patient Spontanous Breathing  Post-op Assessment: Report given to RN and Post -op Vital signs reviewed and stable  Post vital signs: Reviewed and stable  Last Vitals:  Vitals Value Taken Time  BP 157/75 08/20/19 1421  Temp 36.5 C 08/20/19 1411  Pulse 78 08/20/19 1421  Resp 16 08/20/19 1421  SpO2 96 % 08/20/19 1421  Vitals shown include unvalidated device data.  Last Pain:  Vitals:   08/20/19 1411  TempSrc: Tympanic  PainSc: 0-No pain         Complications: No apparent anesthesia complications

## 2019-08-20 NOTE — ED Notes (Signed)
Report given to Merrill, RN

## 2019-08-20 NOTE — ED Provider Notes (Signed)
[facility] Emergency Department Provider Note  ____________________________________________  Time seen: Approximately 4:05 AM  I have reviewed the triage vital signs and the nursing notes.   HISTORY  Chief Complaint Abdominal Pain   HPI Joshua Berry is a 73 y.o. 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  Patient has had 2 days of melena.  Admitted last month for the same.  Underwent an endoscopy which showed a bleeding angiectasia.  No vomiting or coffee-ground emesis, no nausea.  Today patient had 2 episodes of muscle cramping.  He reports that the abdominal wall muscles and his bilateral leg muscles were cramping up pretty significantly which made the daughter called EMS.  Patient denies abdominal pain, chest pain or shortness of breath.  He is not on blood thinners.  Past Medical History:  Diagnosis Date  . Arthritis   . Cancer (HCC)    melanoma, carcinoma  . Cirrhosis of liver ([place])   . Diabetes mellitus without complication ([place])   . Heart murmur   . Hypertension   . Iron deficiency anemia   . NASH (nonalcoholic steatohepatitis)   . Sleep apnea   . Spleen enlarged   . Thrombocytopenia [facility])     Patient Active Problem List   Diagnosis Date Noted  . GI bleed 07/19/2019  . Sepsis ([place]) 12/20/2018    Past Surgical History:  Procedure Laterality Date  . CHOLECYSTECTOMY    . COLONOSCOPY N/A 07/23/2019   Procedure: COLONOSCOPY;  Surgeon: Lin Landsman, MD;  Location: [facility] ENDOSCOPY;  Service: Gastroenterology;  Laterality: N/A;  . ESOPHAGOGASTRODUODENOSCOPY N/A 07/20/2019   Procedure: ESOPHAGOGASTRODUODENOSCOPY (EGD);  Surgeon: Lin Landsman, MD;  Location: [facility] ENDOSCOPY;  Service: Gastroenterology;  Laterality: N/A;  . SKIN BIOPSY    . TEE WITHOUT CARDIOVERSION N/A 12/23/2018   Procedure:  TRANSESOPHAGEAL ECHOCARDIOGRAM (TEE);  Surgeon: Teodoro Spray, MD;  Location: [facility] ORS;  Service: Cardiovascular;  Laterality: N/A;    Prior to Admission medications   Medication Sig Start Date End Date Taking? Authorizing Provider  Alogliptin Benzoate (NESINA) 12.5 MG TABS Take 12.5 mg by mouth daily.    [provider]  atorvastatin (LIPITOR) 10 MG tablet Take 10 mg by mouth daily.    [provider]  B Complex-C (B-COMPLEX WITH VITAMIN C) tablet Take 1 tablet by mouth daily.    [provider]  cholecalciferol (VITAMIN D) 25 MCG (1000 UT) tablet Take 1,000 Units by mouth 2 (two) times daily.     [provider]  furosemide (LASIX) 40 MG tablet Take 1 tablet (40 mg total) by mouth daily. 07/24/19   Max Sane, MD  insulin aspart protamine- aspart (NOVOLOG MIX 70/30) (70-30) 100 UNIT/ML injection Inject 55 Units into the skin 2 (two) times daily with a meal.     [provider]  ipratropium-albuterol (DUONEB) 0.5-2.5 (3) MG/3ML SOLN Take 3 mLs by nebulization every 4 (four) hours as needed (shortness of breath).     [provider]  levothyroxine (SYNTHROID, LEVOTHROID) 100 MCG tablet Take 100 mcg by mouth daily before breakfast.    [provider]  magnesium gluconate (MAGONATE) 500 MG tablet Take 500 mg by mouth 3 (three) times a week.     [provider]  pantoprazole (PROTONIX) 40 MG tablet Take 1 tablet (40 mg total) by mouth 2 (two) times daily before a meal. 07/23/19  Max Sane, MD  spironolactone (ALDACTONE) 100 MG tablet Take 1 tablet (100 mg total) by mouth daily. 07/24/19   Max Sane, MD  tamsulosin (FLOMAX) 0.4 MG CAPS capsule Take 0.4 mg by mouth daily.     [provider]  terazosin (HYTRIN) 5 MG capsule Take 5 mg by mouth at bedtime.    [provider]  traMADol (ULTRAM) 50 MG tablet Take 1 tablet (50 mg total) by mouth every 12 (twelve) hours as needed for moderate pain. 12/25/18   Nicholes Mango, MD  triamcinolone cream (KENALOG) 0.1 % Apply 1 application topically 3 (three) times daily as needed (skin conditions).     [provider]  zinc sulfate 220 (50 Zn) MG capsule Take 1 capsule by mouth daily.    [provider]    Allergies Lisinopril, Sulfamethoxazole, Camphor, Latex, and Nickel  No family history on file.  Social History Social History   Tobacco Use  . Smoking status: Former Research scientist (life sciences)  . Smokeless tobacco: Never Used  Substance Use Topics  . Alcohol use: Not Currently  . Drug use: Not Currently    Review of Systems  Constitutional: Negative for fever. Eyes: Negative for visual changes. ENT: Negative for sore throat. Neck: No neck pain  Cardiovascular: Negative for chest pain. Respiratory: Negative for shortness of breath. Gastrointestinal: Negative for abdominal pain, vomiting or diarrhea. + melena Genitourinary: Negative for dysuria. Musculoskeletal: Negative for back pain. + muscle cramps Skin: Negative for rash. Neurological: Negative for headaches, weakness or numbness. Psych: No SI or HI  ____________________________________________   PHYSICAL EXAM:  VITAL SIGNS: ED Triage Vitals  Enc Vitals Group     BP 08/19/19 2236 (!) 96/51     Pulse Rate 08/19/19 2236 71     Resp 08/19/19 2236 18     Temp 08/19/19 2236 98.2 F (36.8 C)     Temp Source 08/19/19 2236 Oral     SpO2 08/19/19 2236 98 %     Weight 08/19/19 2234 244 lb (110.7 kg)     Height 08/19/19 2234 6' 2"  (1.88 m)     Head Circumference --      Peak Flow --      Pain Score 08/19/19 2234 1     Pain Loc --      Pain Edu? --      Excl. in [place]? --     Constitutional: Alert and oriented. Well appearing and in no apparent distress. HEENT:      Head: Normocephalic and atraumatic.         Eyes: Conjunctivae are normal. Sclera is non-icteric.       Mouth/Throat: Mucous membranes are moist.       Neck: Supple with no signs of meningismus. Cardiovascular: Regular  rate and rhythm. No murmurs, gallops, or rubs. 2+ symmetrical distal pulses are present in all extremities. No JVD. Respiratory: Normal respiratory effort. Lungs are clear to auscultation bilaterally. No wheezes, crackles, or rhonchi.  Gastrointestinal: Soft, non tender, and non distended with positive bowel sounds. No rebound or guarding. Genitourinary: Rectal exam showing melena guaiac positive Musculoskeletal: Nontender with normal range of motion in all extremities. No edema, cyanosis, or erythema of extremities. Neurologic: Normal speech and language. Face is symmetric. Moving all extremities. No gross focal neurologic deficits are appreciated. Skin: Skin is warm, dry and intact. No rash noted. Psychiatric: Mood and affect are normal. Speech and behavior are normal.  ____________________________________________   LABS (all labs ordered are listed, but only  abnormal results are displayed)  Labs Reviewed  LIPASE, BLOOD - Abnormal; Notable for the following components:      Result Value   Lipase 75 (*)    All other components within normal limits  COMPREHENSIVE METABOLIC PANEL - Abnormal; Notable for the following components:   Glucose, Bld 265 (*)    BUN 28 (*)    GFR calc non Af Amer 59 (*)    All other components within normal limits  CBC - Abnormal; Notable for the following components:   RBC 2.84 (*)    Hemoglobin 8.8 (*)    HCT 26.6 (*)    RDW 17.0 (*)    Platelets 87 (*)    All other components within normal limits  SARS CORONAVIRUS 2 (HOSPITAL ORDER, [place] [facility])  URINALYSIS, COMPLETE (UACMP) WITH MICROSCOPIC  PROTIME-INR  APTT  TYPE AND SCREEN  PREPARE RBC (CROSSMATCH)   ____________________________________________  EKG  none ____________________________________________  RADIOLOGY  none  ____________________________________________   PROCEDURES  Procedure(s) performed: None Procedures Critical Care performed: yes  CRITICAL  CARE Performed by: Rudene Re  ?  Total critical care time: 35 min  Critical care time was exclusive of separately billable procedures and treating other patients.  Critical care was necessary to treat or prevent imminent or life-threatening deterioration.  Critical care was time spent personally by me on the following activities: development of treatment plan with patient and/or surrogate as well as nursing, discussions with consultants, evaluation of patient's response to treatment, examination of patient, obtaining history from patient or surrogate, ordering and performing treatments and interventions, ordering and review of laboratory studies, ordering and review of radiographic studies, pulse oximetry and re-evaluation of patient's condition.  ____________________________________________   INITIAL IMPRESSION / ASSESSMENT AND PLAN / ED COURSE   73 y.o. 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  Differential diagnoses including bleeding angiectasia versus variceal bleed versus peptic ulcer disease.  With a history of cirrhosis will start patient on IV Protonix bolus and drip, IV octreotide bolus and drip, IV Rocephin.  We will send a type and screen.  Will transfuse 1U pRBC since BP is soft. No tachycardia, hgb slightly decreased from 9.4 to 8.8.  Discussed with Dr. Marcille Blanco for admission.      As part of my medical decision making, I reviewed the following data within the [place] History obtained from family, Nursing notes reviewed and incorporated, Labs reviewed , Old chart reviewed, Discussed with admitting physician , Notes from prior ED visits and [place] Controlled Substance Database   Patient was evaluated in Emergency Department today for the symptoms described in the history of present illness. Patient was evaluated  in the context of the global COVID-19 pandemic, which necessitated consideration that the patient might be at risk for infection with the SARS-CoV-2 virus that causes COVID-19. Institutional protocols and algorithms that pertain to the evaluation of patients at risk for COVID-19 are in a state of rapid change based on information released by regulatory bodies including the CDC and federal and state organizations. These policies and algorithms were followed during the patient's care in the ED.   ____________________________________________   FINAL CLINICAL IMPRESSION(S) / ED DIAGNOSES   Final diagnoses:  Upper GI bleed  Cirrhosis of liver with ascites, unspecified hepatic cirrhosis type ([place])      NEW MEDICATIONS STARTED DURING THIS  VISIT:  ED Discharge Orders    None       Note:  This document was prepared using Dragon voice recognition software and may include unintentional dictation errors.    Alfred Levins, [place], MD 08/20/19 [phone]

## 2019-08-21 LAB — BPAM RBC
Blood Product Expiration Date: 202009082359
Blood Product Expiration Date: 202009102359
ISSUE DATE / TIME: 202008281206
ISSUE DATE / TIME: 202009040609
Unit Type and Rh: 9500
Unit Type and Rh: 9500

## 2019-08-21 LAB — CBC WITH DIFFERENTIAL/PLATELET
Abs Immature Granulocytes: 0.01 10*3/uL (ref 0.00–0.07)
Basophils Absolute: 0 10*3/uL (ref 0.0–0.1)
Basophils Relative: 1 %
Eosinophils Absolute: 0.1 10*3/uL (ref 0.0–0.5)
Eosinophils Relative: 4 %
HCT: 27.3 % — ABNORMAL LOW (ref 39.0–52.0)
Hemoglobin: 9 g/dL — ABNORMAL LOW (ref 13.0–17.0)
Immature Granulocytes: 0 %
Lymphocytes Relative: 18 %
Lymphs Abs: 0.7 10*3/uL (ref 0.7–4.0)
MCH: 30.5 pg (ref 26.0–34.0)
MCHC: 33 g/dL (ref 30.0–36.0)
MCV: 92.5 fL (ref 80.0–100.0)
Monocytes Absolute: 0.3 10*3/uL (ref 0.1–1.0)
Monocytes Relative: 9 %
Neutro Abs: 2.6 10*3/uL (ref 1.7–7.7)
Neutrophils Relative %: 68 %
Platelets: 69 10*3/uL — ABNORMAL LOW (ref 150–400)
RBC: 2.95 MIL/uL — ABNORMAL LOW (ref 4.22–5.81)
RDW: 16.8 % — ABNORMAL HIGH (ref 11.5–15.5)
Smear Review: NORMAL
WBC: 3.7 10*3/uL — ABNORMAL LOW (ref 4.0–10.5)
nRBC: 0 % (ref 0.0–0.2)

## 2019-08-21 LAB — BASIC METABOLIC PANEL
Anion gap: 10 (ref 5–15)
BUN: 21 mg/dL (ref 8–23)
CO2: 22 mmol/L (ref 22–32)
Calcium: 8.8 mg/dL — ABNORMAL LOW (ref 8.9–10.3)
Chloride: 107 mmol/L (ref 98–111)
Creatinine, Ser: 1.05 mg/dL (ref 0.61–1.24)
GFR calc Af Amer: >60 mL/min (ref >60)
GFR calc non Af Amer: >60 mL/min (ref >60)
Glucose, Bld: 149 mg/dL — ABNORMAL HIGH (ref 70–99)
Potassium: 3.8 mmol/L (ref 3.5–5.1)
Sodium: 139 mmol/L (ref 135–145)

## 2019-08-21 LAB — GLUCOSE, CAPILLARY
Glucose-Capillary: 142 mg/dL — ABNORMAL HIGH (ref 70–99)
Glucose-Capillary: 150 mg/dL — ABNORMAL HIGH (ref 70–99)
Glucose-Capillary: 154 mg/dL — ABNORMAL HIGH (ref 70–99)
Glucose-Capillary: 192 mg/dL — ABNORMAL HIGH (ref 70–99)
Glucose-Capillary: 207 mg/dL — ABNORMAL HIGH (ref 70–99)
Glucose-Capillary: 227 mg/dL — ABNORMAL HIGH (ref 70–99)

## 2019-08-21 LAB — TYPE AND SCREEN
ABO/RH(D): O NEG
Antibody Screen: NEGATIVE
Unit division: 0
Unit division: 0

## 2019-08-21 LAB — PREPARE RBC (CROSSMATCH)

## 2019-08-21 NOTE — Progress Notes (Signed)
[facility] [place] NAME: Joshua Berry    MR#:  [id number]  DATE OF BIRTH:  [birth year]  SUBJECTIVE:  CHIEF COMPLAINT: Patient is resting comfortably denies any abdominal discomfort or nausea.  Tolerating clear liquids.  Patient reports having melena stool  REVIEW OF SYSTEMS:  CONSTITUTIONAL: No fever, fatigue or weakness.  EYES: No blurred or double vision.  EARS, NOSE, AND THROAT: No tinnitus or ear pain.  RESPIRATORY: No cough, shortness of breath, wheezing or hemoptysis.  CARDIOVASCULAR: No chest pain, orthopnea, edema.  GASTROINTESTINAL: No nausea, vomiting, diarrhea or abdominal pain.  Reports melena GENITOURINARY: No dysuria, hematuria.  ENDOCRINE: No polyuria, nocturia,  HEMATOLOGY: No anemia, easy bruising or bleeding SKIN: No rash or lesion. MUSCULOSKELETAL: No joint pain or arthritis.   NEUROLOGIC: No tingling, numbness, weakness.  PSYCHIATRY: No anxiety or depression.   DRUG ALLERGIES:   Allergies  Allergen Reactions  . Lisinopril Hives  . Sulfamethoxazole Swelling  . Camphor Rash and Swelling  . Latex Rash  . Nickel Rash    VITALS:  Blood pressure 129/71, pulse 78, temperature 98.9 F (37.2 C), temperature source Oral, resp. rate 20, height 6' 2"  (1.88 m), weight 101.6 kg, SpO2 99 %.  PHYSICAL EXAMINATION:  GENERAL:  73 y.o.-year-old patient lying in the bed with no acute distress.  EYES: Pupils equal, round, reactive to light and accommodation. No scleral icterus. Extraocular muscles intact.  HEENT: Head atraumatic, normocephalic. Oropharynx and nasopharynx clear.  NECK:  Supple, no jugular venous distention. No thyroid enlargement, no tenderness.  LUNGS: Normal breath sounds bilaterally, no wheezing, rales,rhonchi or crepitation. No use of accessory muscles of respiration.  CARDIOVASCULAR: S1, S2 normal. No murmurs, rubs, or gallops.  ABDOMEN: Soft, nontender, nondistended. Bowel sounds present.   EXTREMITIES: No pedal edema, cyanosis, or clubbing.  NEUROLOGIC: Cranial nerves II through XII are intact. Muscle strength 5/5 in all extremities. Sensation intact. Gait not checked.  PSYCHIATRIC: The patient is alert and oriented x 3.  SKIN: No obvious rash, lesion, or ulcer.    LABORATORY PANEL:   CBC Recent Labs  Lab 08/21/19 0657  WBC 3.7*  HGB 9.0*  HCT 27.3*  PLT 69*   ------------------------------------------------------------------------------------------------------------------  Chemistries  Recent Labs  Lab 08/20/19 1533 08/21/19 0657  NA 139 139  K 4.2 3.8  CL 106 107  CO2 23 22  GLUCOSE 184* 149*  BUN 27* 21  CREATININE 1.15 1.05  CALCIUM 8.8* 8.8*  MG 1.9  --   AST 38  --   ALT 30  --   ALKPHOS 64  --   BILITOT 0.9  --    ------------------------------------------------------------------------------------------------------------------  Cardiac Enzymes No results for input(s): TROPONINI in the last 168 hours. ------------------------------------------------------------------------------------------------------------------  RADIOLOGY:  No results found.  EKG:   Orders placed or performed during the hospital encounter of 07/19/19  . ED EKG  . ED EKG  . EKG    ASSESSMENT AND PLAN:    This is a 73 year old male admitted for GI bleeding. 1.  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 Status post blood transfusion hemoglobin at 9.0 today continue close monitoring and transfuse as needed Seen by gastroenterology Dr. Bonna Gains and patient had EGD done on 08/20/2019 which has revealed grade 2 esophageal varices nonbleeding and portal hypertensive gastropathy  Continue Protonix twice daily and discontinue octreotide.  No NSAIDs Clear liquid diet and advance as tolerated  IV Rocephin for prophylaxis  2.  Cirrhosis: With ascites;  Continue Lasix  and spironolactone.  Patient on beta-blocker for risk reduction of variceal bleeding  3.   Hypertension: Uncontrolled.  Continue terazosin.  Titrate as needed  4.  Diabetes mellitus type 2: Resume basal insulin as well as sliding scale once pharmacy reconciliation is complete  5.  Hyperlipidemia: Continue statin therapy  6.  Hypothyroidism: Check TSH; continue Synthroid 7.  DVT prophylaxis: SCDs    All the records are reviewed and case discussed with Care Management/Social Workerr. Management plans discussed with the patient, family and they are in agreement.  CODE STATUS: fc   TOTAL TIME TAKING CARE OF THIS PATIENT: 36  minutes.   POSSIBLE D/C IN 1-2 DAYS, DEPENDING ON CLINICAL CONDITION.  Note: This dictation was prepared with Dragon dictation along with smaller phrase technology. Any transcriptional errors that result from this process are unintentional.   Nicholes Mango M.D on 08/21/2019 at 1:31 PM  Between 7am to 6pm - Pager - [phone] After 6pm go to [email or web address] - password [email or web address] [facility]  [place] [facility]  Office  [phone]  CC: Primary care physician; Lorelei Pont, MD

## 2019-08-22 LAB — BASIC METABOLIC PANEL
Anion gap: 8 (ref 5–15)
BUN: 14 mg/dL (ref 8–23)
CO2: 22 mmol/L (ref 22–32)
Calcium: 8.7 mg/dL — ABNORMAL LOW (ref 8.9–10.3)
Chloride: 111 mmol/L (ref 98–111)
Creatinine, Ser: 0.97 mg/dL (ref 0.61–1.24)
GFR calc Af Amer: >60 mL/min (ref >60)
GFR calc non Af Amer: >60 mL/min (ref >60)
Glucose, Bld: 137 mg/dL — ABNORMAL HIGH (ref 70–99)
Potassium: 3.6 mmol/L (ref 3.5–5.1)
Sodium: 141 mmol/L (ref 135–145)

## 2019-08-22 LAB — CBC
HCT: 26.2 % — ABNORMAL LOW (ref 39.0–52.0)
Hemoglobin: 8.8 g/dL — ABNORMAL LOW (ref 13.0–17.0)
MCH: 31.2 pg (ref 26.0–34.0)
MCHC: 33.6 g/dL (ref 30.0–36.0)
MCV: 92.9 fL (ref 80.0–100.0)
Platelets: 64 10*3/uL — ABNORMAL LOW (ref 150–400)
RBC: 2.82 MIL/uL — ABNORMAL LOW (ref 4.22–5.81)
RDW: 16.7 % — ABNORMAL HIGH (ref 11.5–15.5)
WBC: 3.3 10*3/uL — ABNORMAL LOW (ref 4.0–10.5)
nRBC: 0 % (ref 0.0–0.2)

## 2019-08-22 LAB — GLUCOSE, CAPILLARY
Glucose-Capillary: 130 mg/dL — ABNORMAL HIGH (ref 70–99)
Glucose-Capillary: 124 mg/dL — ABNORMAL HIGH (ref 70–99)
Glucose-Capillary: 161 mg/dL — ABNORMAL HIGH (ref 70–99)

## 2019-08-22 LAB — TSH: TSH: 2.767 u[IU]/mL (ref 0.350–4.500)

## 2019-08-22 MED ORDER — INSULIN ASPART PROT & ASPART (70-30 MIX) 100 UNIT/ML ~~LOC~~ SUSP: 30.0000 [IU] | Freq: Two times a day (BID) | SUBCUTANEOUS | 11 refills | Status: DC

## 2019-08-22 MED ORDER — PANTOPRAZOLE SODIUM 40 MG PO TBEC: 40.0000 mg | DELAYED_RELEASE_TABLET | Freq: Two times a day (BID) | ORAL | 1 refills | Status: DC

## 2019-08-22 MED ORDER — FLUTICASONE PROPIONATE 50 MCG/ACT NA SUSP
1.0000 | Freq: Every day | NASAL | Status: DC | PRN
  Administered 2019-08-22: 1 via NASAL
  Filled 2019-08-22: qty 16

## 2019-08-22 NOTE — Progress Notes (Signed)
Pt complaints of a clogged nose . Notify [facility] and talked to Dr. Jannifer Franklin and states will place order. Will continue to monitor.

## 2019-08-22 NOTE — Plan of Care (Signed)
  Problem: Education: Goal: Knowledge of General Education information will improve Description: Including pain rating scale, medication(s)/side effects and non-pharmacologic comfort measures Outcome: Progressing   Problem: Clinical Measurements: Goal: Diagnostic test results will improve Outcome: Progressing   Problem: Activity: Goal: Risk for activity intolerance will decrease Outcome: Progressing   Problem: Safety: Goal: Ability to remain free from injury will improve Outcome: Progressing

## 2019-08-22 NOTE — Progress Notes (Signed)
Patient has minor nose bleed. MD notified. Will continue to monitor.

## 2019-08-22 NOTE — Discharge Instructions (Signed)
Follow-up with primary care physician in 3 to 5 days following that appointment Follow-up with your GI in 1-2 weeks

## 2019-08-22 NOTE — Discharge Summary (Signed)
[place] [facility] [place] NAME: Joshua Berry    MR#:  [id number]  DATE OF BIRTH:  May 03, 1946  DATE OF ADMISSION:  08/20/2019 ADMITTING PHYSICIAN: Harrie Foreman, MD  DATE OF DISCHARGE: 08/22/19  PRIMARY CARE PHYSICIAN: Lorelei Pont, MD    ADMISSION DIAGNOSIS:  Upper GI bleed [K92.2] Cirrhosis of liver with ascites, unspecified hepatic cirrhosis type ([place]) [K74.60, R18.8]  DISCHARGE DIAGNOSIS:  Active Problems:   Acute gastrointestinal hemorrhage   Cirrhosis of liver with ascites ([place])   Secondary esophageal varices without bleeding (HCC)   Portal hypertension ([place])   Stomach irritation   Melena   SECONDARY DIAGNOSIS:   Past Medical History:  Diagnosis Date  . Arthritis   . Cancer (HCC)    melanoma, carcinoma  . Cirrhosis of liver ([place])   . Diabetes mellitus without complication ([place])   . Heart murmur   . Hypertension   . Iron deficiency anemia   . NASH (nonalcoholic steatohepatitis)   . Sleep apnea   . Spleen enlarged   . Thrombocytopenia [facility])     HOSPITAL COURSE:   HPI: The patient with past medical history of cirrhosis of the liver, diabetes, hypertension and thrombocytopenia presents to the emergency department complaining of abdominal pain.  The patient is seen dark-colored stools for several days.  He was admitted to the hospital 2 months ago for GI bleeding and found to have a bleeding ectatic vessel in his duodenum.  Since that time the patient has been well until today.  In the emergency department he was started on Protonix drip as well as octreotide prior to the emergency department staff, hospitalist service for admission.  1. 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 Status post blood transfusion hemoglobin at 9.0 today continue close monitoring and transfuse as needed Seen by gastroenterology Dr. Bonna Gains and patient had EGD done on 08/20/2019 which has revealed grade 2 esophageal varices  nonbleeding and portal hypertensive gastropathy Hemodynamically stable-hemoglobin 9-8.8.  No other episodes of bleeding Continue Protonix twice daily and discontinue octreotide.  No NSAIDs Clear liquid diet given , pt is tolerating  advance d diet   IV Rocephin for prophylaxis was given for 3 days , no more  need of abx  [facility]. RN will Fax EGD and [facility] to [facility] hospital prior to discharge  2. Cirrhosis: With ascites; Continue Lasix and spironolactone. Patient on beta-blocker for risk reduction of variceal bleeding  3. Hypertension: Uncontrolled. Continue terazosin.  Titrate as needed  4. Diabetes mellitus type 2: Resume basal insulin as well as sliding scale once pharmacy reconciliation is complete  5. Hyperlipidemia: Continue statin therapy  6. Hypothyroidism: continue Synthroid, TSH nml  7. DVT prophylaxis: SCDs  DISCHARGE CONDITIONS:   stable  CONSULTS OBTAINED:     PROCEDURES EGD  DRUG ALLERGIES:   Allergies  Allergen Reactions  . Lisinopril Hives  . Sulfamethoxazole Swelling  . Camphor Rash and Swelling  . Latex Rash  . Nickel Rash    DISCHARGE MEDICATIONS:   Allergies as of 08/22/2019      Reactions   Lisinopril Hives   Sulfamethoxazole Swelling   Camphor Rash, Swelling   Latex Rash   Nickel Rash      Medication List    STOP taking these medications   omeprazole 40 MG capsule Commonly known as: PRILOSEC   terazosin 5 MG capsule Commonly known as: HYTRIN   traMADol 50 MG tablet  Commonly known as: ULTRAM     TAKE these medications   Alpha-Lipoic Acid 100 MG Caps Take 1 capsule by mouth daily.   atorvastatin 10 MG tablet Commonly known as: LIPITOR Take 10 mg by mouth daily.   B-complex with vitamin C tablet Take 1 tablet by mouth daily.   cholecalciferol 25 MCG (1000 UT) tablet Commonly known as: VITAMIN D Take 1,000 Units by mouth 2 (two) times daily.   [employer] ADV PROBIOTIC GUMMIES PO Take 1 each  by mouth daily.   Fish Oil 1000 MG Caps Take 1 capsule by mouth at bedtime.   folic acid 1 MG tablet Commonly known as: FOLVITE Take 1 mg by mouth at bedtime.   furosemide 40 MG tablet Commonly known as: LASIX Take 1 tablet (40 mg total) by mouth daily.   insulin aspart protamine- aspart (70-30) 100 UNIT/ML injection Commonly known as: NOVOLOG MIX 70/30 Inject 0.3 mLs (30 Units total) into the skin 2 (two) times daily with a meal. What changed: how much to take   ipratropium-albuterol 0.5-2.5 (3) MG/3ML Soln Commonly known as: DUONEB Take 3 mLs by nebulization every 4 (four) hours as needed (shortness of breath).   levothyroxine 100 MCG tablet Commonly known as: SYNTHROID Take 100 mcg by mouth daily before breakfast.   magnesium gluconate 500 MG tablet Commonly known as: MAGONATE Take 500 mg by mouth daily.   Melatonin 10 MG Tabs Take 10 mg by mouth at bedtime.   Nesina 12.5 MG Tabs Generic drug: Alogliptin Benzoate Take 12.5 mg by mouth daily.   pantoprazole 40 MG tablet Commonly known as: PROTONIX Take 1 tablet (40 mg total) by mouth 2 (two) times daily before a meal.   spironolactone 100 MG tablet Commonly known as: ALDACTONE Take 1 tablet (100 mg total) by mouth daily.   tamsulosin 0.4 MG Caps capsule Commonly known as: FLOMAX Take 0.4 mg by mouth daily.   vitamin C 500 MG tablet Commonly known as: ASCORBIC ACID Take 500 mg by mouth daily.   Zinc Sulfate 220 (50 Zn) MG Tabs Take 110 mg by mouth every evening.        DISCHARGE INSTRUCTIONS:   Follow-up with primary care physician in 3 to 5 days following that appointment Follow-up with your GI in 1-2 weeks Stop taking NSAIDs  DIET:  Diabetic diet  DISCHARGE CONDITION:  Stable  ACTIVITY:  Activity as tolerated  OXYGEN:  Home Oxygen: No.   Oxygen Delivery: room air  DISCHARGE LOCATION:  home   If you experience worsening of your admission symptoms, develop shortness of breath, life  threatening emergency, suicidal or homicidal thoughts you must seek medical attention immediately by calling 911 or calling your MD immediately  if symptoms less severe.  You Must read complete instructions/literature along with all the possible adverse reactions/side effects for all the Medicines you take and that have been prescribed to you. Take any new Medicines after you have completely understood and accpet all the possible adverse reactions/side effects.   Please note  You were cared for by a hospitalist during your hospital stay. If you have any questions about your discharge medications or the care you received while you were in the hospital after you are discharged, you can call the unit and asked to speak with the hospitalist on call if the hospitalist that took care of you is not available. Once you are discharged, your primary care physician will handle any further medical issues. Please note that NO REFILLS for any  discharge medications will be authorized once you are discharged, as it is imperative that you return to your primary care physician (or establish a relationship with a primary care physician if you do not have one) for your aftercare needs so that they can reassess your need for medications and monitor your lab values.     Today  Chief Complaint  Patient presents with  . Abdominal Pain   Patient is doing fine tolerating advanced diet.  Denies any nausea vomiting or abdominal pain no other episodes of bleeding.  No other episodes of melena stool.  Hemoglobin is stable and wants to go home.  Okay to discharge patient from GI standpoint  ROS:  CONSTITUTIONAL: Denies fevers, chills. Denies any fatigue, weakness.  EYES: Denies blurry vision, double vision, eye pain. EARS, NOSE, THROAT: Denies tinnitus, ear pain, hearing loss. RESPIRATORY: Denies cough, wheeze, shortness of breath.  CARDIOVASCULAR: Denies chest pain, palpitations, edema.  GASTROINTESTINAL: Denies nausea,  vomiting, diarrhea, abdominal pain. Denies bright red blood per rectum. GENITOURINARY: Denies dysuria, hematuria. ENDOCRINE: Denies nocturia or thyroid problems. HEMATOLOGIC AND LYMPHATIC: Denies easy bruising or bleeding. SKIN: Denies rash or lesion. MUSCULOSKELETAL: Denies pain in neck, back, shoulder, knees, hips or arthritic symptoms.  NEUROLOGIC: Denies paralysis, paresthesias.  PSYCHIATRIC: Denies anxiety or depressive symptoms.   VITAL SIGNS:  Blood pressure (!) 142/74, pulse 60, temperature 98.7 F (37.1 C), temperature source Oral, resp. rate 16, height 6' 2"  (1.88 m), weight 111 kg, SpO2 96 %.  I/O:    Intake/Output Summary (Last 24 hours) at 08/22/2019 1129 Last data filed at 08/22/2019 1003 Gross per 24 hour  Intake 2603.33 ml  Output 800 ml  Net 1803.33 ml    PHYSICAL EXAMINATION:  GENERAL:  72 y.o.-year-old patient lying in the bed with no acute distress.  EYES: Pupils equal, round, reactive to light and accommodation. No scleral icterus. Extraocular muscles intact.  HEENT: Head atraumatic, normocephalic. Oropharynx and nasopharynx clear.  NECK:  Supple, no jugular venous distention. No thyroid enlargement, no tenderness.  LUNGS: Normal breath sounds bilaterally, no wheezing, rales,rhonchi or crepitation. No use of accessory muscles of respiration.  CARDIOVASCULAR: S1, S2 normal. No murmurs, rubs, or gallops.  ABDOMEN: Soft, non-tender, non-distended. Bowel sounds present.  EXTREMITIES: No pedal edema, cyanosis, or clubbing.  NEUROLOGIC: Cranial nerves II through XII are intact. Muscle strength 5/5 in all extremities. Sensation intact. Gait not checked.  PSYCHIATRIC: The patient is alert and oriented x 3.  SKIN: No obvious rash, lesion, or ulcer.   DATA REVIEW:   CBC Recent Labs  Lab 08/22/19 0655  WBC 3.3*  HGB 8.8*  HCT 26.2*  PLT 64*    Chemistries  Recent Labs  Lab 08/20/19 1533  08/22/19 0655  NA 139   < > 141  K 4.2   < > 3.6  CL 106   < > 111   CO2 23   < > 22  GLUCOSE 184*   < > 137*  BUN 27*   < > 14  CREATININE 1.15   < > 0.97  CALCIUM 8.8*   < > 8.7*  MG 1.9  --   --   AST 38  --   --   ALT 30  --   --   ALKPHOS 64  --   --   BILITOT 0.9  --   --    < > = values in this interval not displayed.    Cardiac Enzymes No results for input(s): TROPONINI in the  last 168 hours.  Microbiology Results  Results for orders placed or performed during the hospital encounter of 08/20/19  SARS Coronavirus 2 [facility] order, Performed in [facility] hospital lab) Nasopharyngeal Nasopharyngeal Swab     Status: None   Collection Time: 08/20/19  4:27 AM   Specimen: Nasopharyngeal Swab  Result Value Ref Range Status   SARS Coronavirus 2 NEGATIVE NEGATIVE Final    Comment: (NOTE) If result is NEGATIVE SARS-CoV-2 target nucleic acids are NOT DETECTED. The SARS-CoV-2 RNA is generally detectable in upper and lower  respiratory specimens during the acute phase of infection. The lowest  concentration of SARS-CoV-2 viral copies this assay can detect is 250  copies / mL. A negative result does not preclude SARS-CoV-2 infection  and should not be used as the sole basis for treatment or other  patient management decisions.  A negative result may occur with  improper specimen collection / handling, submission of specimen other  than nasopharyngeal swab, presence of viral mutation(s) within the  areas targeted by this assay, and inadequate number of viral copies  (<250 copies / mL). A negative result must be combined with clinical  observations, patient history, and epidemiological information. If result is POSITIVE SARS-CoV-2 target nucleic acids are DETECTED. The SARS-CoV-2 RNA is generally detectable in upper and lower  respiratory specimens dur ing the acute phase of infection.  Positive  results are indicative of active infection with SARS-CoV-2.  Clinical  correlation with patient history and other diagnostic information is  necessary  to determine patient infection status.  Positive results do  not rule out bacterial infection or co-infection with other viruses. If result is PRESUMPTIVE POSTIVE SARS-CoV-2 nucleic acids MAY BE PRESENT.   A presumptive positive result was obtained on the submitted specimen  and confirmed on repeat testing.  While 2019 novel coronavirus  (SARS-CoV-2) nucleic acids may be present in the submitted sample  additional confirmatory testing may be necessary for epidemiological  and / or clinical management purposes  to differentiate between  SARS-CoV-2 and other Sarbecovirus currently known to infect humans.  If clinically indicated additional testing with an alternate test  methodology [phone]) is advised. The SARS-CoV-2 RNA is generally  detectable in upper and lower respiratory sp ecimens during the acute  phase of infection. The expected result is Negative. Fact Sheet for Patients:  StrictlyIdeas.no Fact Sheet for Healthcare Providers: BankingDealers.co.za This test is not yet approved or cleared by the [place] FDA and has been authorized for detection and/or diagnosis of SARS-CoV-2 by FDA under an Emergency Use Authorization (EUA).  This EUA will remain in effect (meaning this test can be used) for the duration of the COVID-19 declaration under Section 564(b)(1) of the Act, 21 U.S.C. section 360bbb-3(b)(1), unless the authorization is terminated or revoked sooner. Performed at [facility], [address] [place], [place] [address]   MRSA PCR Screening     Status: None   Collection Time: 08/20/19 12:32 PM   Specimen: Nasopharyngeal  Result Value Ref Range Status   MRSA by PCR NEGATIVE NEGATIVE Final    Comment:        The GeneXpert MRSA Assay (FDA approved for NASAL specimens only), is one component of a comprehensive MRSA colonization surveillance program. It is not intended to diagnose MRSA infection nor to  guide or monitor treatment for MRSA infections. Performed at [facility], [address], [place], [place] [address]     RADIOLOGY:  No results found.  EKG:  Orders placed or performed during the hospital encounter of 07/19/19  . ED EKG  . ED EKG  . EKG      Management plans discussed with the patient, family and they are in agreement.  CODE STATUS:     Code Status Orders  (From admission, onward)         Start     Ordered   08/20/19 0720  Full code  Continuous     08/20/19 0719        Code Status History    Date Active Date Inactive Code Status Order ID Comments User Context   07/19/2019 1925 07/23/2019 2106 Full Code [id number]  Mayer Camel, NP ED   12/20/2018 1949 12/25/2018 2052 Full Code [id number]  Epifanio Lesches, MD ED   Advance Care Planning Activity      TOTAL TIME TAKING CARE OF THIS PATIENT: 45  minutes.   Note: This dictation was prepared with Dragon dictation along with smaller phrase technology. Any transcriptional errors that result from this process are unintentional.   @MEC @  on 08/22/2019 at 11:29 AM  Between 7am to 6pm - Pager - [phone]  After 6pm go to [email or web address] - password [email or web address] [facility]  [place] [facility]  Office  [phone]  CC: Primary care physician; Lorelei Pont, MD

## 2019-08-26 ENCOUNTER — Telehealth: Payer: Self-pay

## 2019-08-26 NOTE — Telephone Encounter (Signed)
Faxed the Colonoscopy and EGD report to them.  Called and got the fax number and it was [phone]. Patient states they were waiting on these reports for them to make him a appointment.

## 2019-08-26 NOTE — Telephone Encounter (Signed)
Patient states he has called the office to inform them that he needed a appointment with Dr. Allyn Kenner the [facility] in [place]. He states they have not called him with a appointment yet but we can go on and send the EGD report to them.

## 2019-08-26 NOTE — Telephone Encounter (Signed)
-----   Message from Virgel Manifold, MD sent at 08/22/2019  5:31 PM EDT ----- Please call pt and ask him if he has made an appointment with his GI provider at the [facility] and when the appt is. If he can give [place] the name of his GI provider so we can fax him the EGD report from his last procedure at [facility] would be great. Please message me back or route your documentation to me about what you find out. thanks

## 2019-08-30 ENCOUNTER — Telehealth: Payer: Self-pay | Admitting: Gastroenterology

## 2019-08-30 NOTE — Telephone Encounter (Signed)
-----   Message from Shelby Mattocks, [place] sent at 08/27/2019  9:26 AM EDT ----- Patient need 2 week hospital follow up visit

## 2019-08-30 NOTE — Telephone Encounter (Signed)
Spoke with pt he has already scheduled a follow up apt with the [place] [facility]

## 2019-09-02 ENCOUNTER — Other Ambulatory Visit: Payer: Self-pay

## 2019-09-02 ENCOUNTER — Encounter: Payer: Self-pay | Admitting: Emergency Medicine

## 2019-09-02 ENCOUNTER — Emergency Department
Admission: EM | Admit: 2019-09-02 | Discharge: 2019-09-02 | Disposition: A | Discharge status: Home / Self Care | Payer: No Typology Code available for payment source | Attending: Emergency Medicine | Admitting: Emergency Medicine

## 2019-09-02 DIAGNOSIS — Z79899 Other long term (current) drug therapy: Secondary | ICD-10-CM | POA: Diagnosis not present

## 2019-09-02 DIAGNOSIS — R739 Hyperglycemia, unspecified: Secondary | ICD-10-CM

## 2019-09-02 DIAGNOSIS — E1165 Type 2 diabetes mellitus with hyperglycemia: Secondary | ICD-10-CM | POA: Diagnosis not present

## 2019-09-02 DIAGNOSIS — Z85820 Personal history of malignant melanoma of skin: Secondary | ICD-10-CM | POA: Insufficient documentation

## 2019-09-02 DIAGNOSIS — I10 Essential (primary) hypertension: Secondary | ICD-10-CM | POA: Insufficient documentation

## 2019-09-02 DIAGNOSIS — Z794 Long term (current) use of insulin: Secondary | ICD-10-CM | POA: Diagnosis not present

## 2019-09-02 DIAGNOSIS — Z87891 Personal history of nicotine dependence: Secondary | ICD-10-CM | POA: Insufficient documentation

## 2019-09-02 LAB — CBC WITH DIFFERENTIAL/PLATELET
Abs Immature Granulocytes: 0.02 10*3/uL (ref 0.00–0.07)
Basophils Absolute: 0 10*3/uL (ref 0.0–0.1)
Basophils Relative: 1 %
Eosinophils Absolute: 0.1 10*3/uL (ref 0.0–0.5)
Eosinophils Relative: 3 %
HCT: 26.8 % — ABNORMAL LOW (ref 39.0–52.0)
Hemoglobin: 8.9 g/dL — ABNORMAL LOW (ref 13.0–17.0)
Immature Granulocytes: 1 %
Lymphocytes Relative: 32 %
Lymphs Abs: 0.8 10*3/uL (ref 0.7–4.0)
MCH: 30.4 pg (ref 26.0–34.0)
MCHC: 33.2 g/dL (ref 30.0–36.0)
MCV: 91.5 fL (ref 80.0–100.0)
Monocytes Absolute: 0.3 10*3/uL (ref 0.1–1.0)
Monocytes Relative: 12 %
Neutro Abs: 1.4 10*3/uL — ABNORMAL LOW (ref 1.7–7.7)
Neutrophils Relative %: 51 %
Platelets: 64 10*3/uL — ABNORMAL LOW (ref 150–400)
RBC: 2.93 MIL/uL — ABNORMAL LOW (ref 4.22–5.81)
RDW: 16.3 % — ABNORMAL HIGH (ref 11.5–15.5)
WBC: 2.7 10*3/uL — ABNORMAL LOW (ref 4.0–10.5)
nRBC: 0 % (ref 0.0–0.2)

## 2019-09-02 LAB — COMPREHENSIVE METABOLIC PANEL
ALT: 36 U/L (ref 0–44)
AST: 38 U/L (ref 15–41)
Albumin: 3.6 g/dL (ref 3.5–5.0)
Alkaline Phosphatase: 85 U/L (ref 38–126)
Anion gap: 10 (ref 5–15)
BUN: 19 mg/dL (ref 8–23)
CO2: 24 mmol/L (ref 22–32)
Calcium: 9.2 mg/dL (ref 8.9–10.3)
Chloride: 100 mmol/L (ref 98–111)
Creatinine, Ser: 1.07 mg/dL (ref 0.61–1.24)
GFR calc Af Amer: >60 mL/min (ref >60)
GFR calc non Af Amer: >60 mL/min (ref >60)
Glucose, Bld: 457 mg/dL — ABNORMAL HIGH (ref 70–99)
Potassium: 4.1 mmol/L (ref 3.5–5.1)
Sodium: 134 mmol/L — ABNORMAL LOW (ref 135–145)
Total Bilirubin: 0.8 mg/dL (ref 0.3–1.2)
Total Protein: 7.5 g/dL (ref 6.5–8.1)

## 2019-09-02 LAB — URINALYSIS, COMPLETE (UACMP) WITH MICROSCOPIC
Bacteria, UA: NONE SEEN
Bilirubin Urine: NEGATIVE
Glucose, UA: >=500 mg/dL — AB
Hgb urine dipstick: NEGATIVE
Ketones, ur: NEGATIVE mg/dL
Leukocytes,Ua: NEGATIVE
Nitrite: NEGATIVE
Protein, ur: NEGATIVE mg/dL
Specific Gravity, Urine: 1.024 (ref 1.005–1.030)
Squamous Epithelial / HPF: NONE SEEN (ref 0–5)
pH: 6 (ref 5.0–8.0)

## 2019-09-02 LAB — GLUCOSE, CAPILLARY
Glucose-Capillary: 294 mg/dL — ABNORMAL HIGH (ref 70–99)
Glucose-Capillary: 431 mg/dL — ABNORMAL HIGH (ref 70–99)

## 2019-09-02 MED ORDER — SODIUM CHLORIDE 0.9 % IV BOLUS
1000.0000 mL | Freq: Once | INTRAVENOUS | Status: AC
  Administered 2019-09-02: 04:00:00 1000 mL via INTRAVENOUS

## 2019-09-02 MED ORDER — INSULIN ASPART 100 UNIT/ML ~~LOC~~ SOLN
8.0000 [IU] | Freq: Once | SUBCUTANEOUS | Status: AC
  Administered 2019-09-02: 8 [IU] via INTRAVENOUS
  Filled 2019-09-02: qty 1

## 2019-09-02 NOTE — ED Triage Notes (Signed)
Patient ambulatory to triage with steady gait, without difficulty or distress noted, mask in place; pt reports FSBS 488 tonight accomp by "dry mouth & feeling thirsty"

## 2019-09-02 NOTE — ED Provider Notes (Signed)
[facility] Emergency Department Provider Note  Time seen: 4:05 AM  I have reviewed the triage vital signs and the nursing notes.   HISTORY  Chief Complaint Hyperglycemia   HPI Joshua Berry is a 73 y.o. male with a past medical history of arthritis, cirrhosis, diabetes, hypertension, presents to the emergency department for hyperglycemia.  According to the patient he states today his blood sugars have been running in the 400s which concerned him so he came to the emergency department.  Patient states his doctor has recently added an additional diabetic medication to try to control his blood sugar better.  Denies any recent illnesses specifically denies any fever cough congestion or shortness of breath.  Overall the patient appears well, no distress.   Past Medical History:  Diagnosis Date  . Arthritis   . Cancer (HCC)    melanoma, carcinoma  . Cirrhosis of liver ([place])   . Diabetes mellitus without complication ([place])   . Heart murmur   . Hypertension   . Iron deficiency anemia   . NASH (nonalcoholic steatohepatitis)   . Sleep apnea   . Spleen enlarged   . Thrombocytopenia [facility]     Patient Active Problem List   Diagnosis Date Noted  . Cirrhosis of liver with ascites ([place])   . Secondary esophageal varices without bleeding ([place])   . Portal hypertension ([place])   . Stomach irritation   . Melena   . Acute gastrointestinal hemorrhage 07/19/2019  . Sepsis ([place]) 12/20/2018    Past Surgical History:  Procedure Laterality Date  . CHOLECYSTECTOMY    . COLONOSCOPY N/A 07/23/2019   Procedure: COLONOSCOPY;  Surgeon: Lin Landsman, MD;  Location: [facility] ENDOSCOPY;  Service: Gastroenterology;  Laterality: N/A;  . ESOPHAGOGASTRODUODENOSCOPY N/A 07/20/2019   Procedure: ESOPHAGOGASTRODUODENOSCOPY (EGD);  Surgeon: Lin Landsman, MD;  Location: [facility] ENDOSCOPY;  Service: Gastroenterology;  Laterality: N/A;  . ESOPHAGOGASTRODUODENOSCOPY Left 08/20/2019   Procedure: ESOPHAGOGASTRODUODENOSCOPY (EGD);  Surgeon: Virgel Manifold, MD;  Location: [facility] ENDOSCOPY;  Service: Endoscopy;  Laterality: Left;  . SKIN BIOPSY    . TEE WITHOUT CARDIOVERSION N/A 12/23/2018   Procedure: TRANSESOPHAGEAL ECHOCARDIOGRAM (TEE);  Surgeon: Teodoro Spray, MD;  Location: [facility] ORS;  Service: Cardiovascular;  Laterality: N/A;    Prior to Admission medications   Medication Sig Start Date End Date Taking? Authorizing Provider  Alogliptin Benzoate (NESINA) 12.5 MG TABS Take 12.5 mg by mouth daily.    [provider]  Alpha-Lipoic Acid 100 MG CAPS Take 1 capsule by mouth daily.    [provider]  atorvastatin (LIPITOR) 10 MG tablet Take 10 mg by mouth daily.    [provider]  B Complex-C (B-COMPLEX WITH VITAMIN C) tablet Take 1 tablet by mouth daily.    [provider]  cholecalciferol (VITAMIN D) 25 MCG (1000 UT) tablet Take 1,000 Units by mouth 2 (two) times daily.     [provider]  folic acid (FOLVITE) 1 MG tablet Take 1 mg by mouth at bedtime.    [provider]  furosemide (LASIX) 40 MG tablet Take 1 tablet (40 mg total) by mouth daily. 07/24/19   Max Sane, MD  insulin aspart protamine- aspart (NOVOLOG MIX 70/30) (70-30) 100 UNIT/ML injection Inject 0.3 mLs (30 Units total) into the skin 2 (two) times daily with a meal. 08/22/19   Gouru, Aruna, MD  ipratropium-albuterol (DUONEB) 0.5-2.5 (3) MG/3ML SOLN Take 3 mLs by nebulization every 4 (four) hours as needed (shortness of breath).  [provider]  levothyroxine (SYNTHROID, LEVOTHROID) 100 MCG tablet Take 100 mcg by mouth daily before breakfast.    [provider]  magnesium gluconate (MAGONATE) 500 MG tablet Take 500 mg by mouth daily.     [provider]  Melatonin 10 MG TABS Take 10 mg by mouth at bedtime.    [provider]  Omega-3 Fatty Acids (FISH OIL) 1000 MG CAPS Take 1 capsule by mouth at bedtime.     [provider]  pantoprazole (PROTONIX) 40 MG tablet Take 1 tablet (40 mg total) by mouth 2 (two) times daily before a meal. 08/22/19   Gouru, Aruna, MD  Probiotic Product ([employer] ADV PROBIOTIC GUMMIES PO) Take 1 each by mouth daily.    [provider]  spironolactone (ALDACTONE) 100 MG tablet Take 1 tablet (100 mg total) by mouth daily. 07/24/19   Max Sane, MD  tamsulosin (FLOMAX) 0.4 MG CAPS capsule Take 0.4 mg by mouth daily.     [provider]  vitamin C (ASCORBIC ACID) 500 MG tablet Take 500 mg by mouth daily.    [provider]  Zinc Sulfate 220 (50 Zn) MG TABS Take 110 mg by mouth every evening.     [provider]    Allergies  Allergen Reactions  . Lisinopril Hives  . Sulfamethoxazole Swelling  . Camphor Rash and Swelling  . Latex Rash  . Nickel Rash    No family history on file.  Social History Social History   Tobacco Use  . Smoking status: Former Research scientist (life sciences)  . Smokeless tobacco: Never Used  Substance Use Topics  . Alcohol use: Not Currently  . Drug use: Not Currently    Review of Systems Constitutional: Negative for fever. Cardiovascular: Negative for chest pain. Respiratory: Negative for shortness of breath. Gastrointestinal: Negative for abdominal pain, vomiting  Musculoskeletal: Negative for musculoskeletal complaints Neurological: Negative for headache All other ROS negative  ____________________________________________   PHYSICAL EXAM:  VITAL SIGNS: ED Triage Vitals  Enc Vitals Group     BP 09/02/19 0037 (!) 118/58     Pulse Rate 09/02/19 0037 73     Resp 09/02/19 0037 17     Temp 09/02/19 0037 98.4 F (36.9 C)     Temp Source 09/02/19 0037 Oral     SpO2 09/02/19 0037 97 %     Weight 09/02/19 0035 245 lb (111.1 kg)     Height 09/02/19 0035 6' 2"  (1.88 m)     Head Circumference --      Peak Flow --      Pain Score --      Pain Loc --      Pain Edu? --      Excl. in [place]? --    Constitutional: Alert and  oriented. Well appearing and in no distress. Eyes: Normal exam ENT      Head: Normocephalic and atraumatic.      Mouth/Throat: Mucous membranes are moist. Cardiovascular: Normal rate, regular rhythm. No murmur Respiratory: Normal respiratory effort without tachypnea nor retractions. Breath sounds are clear  Gastrointestinal: Soft and nontender. No distention.  Musculoskeletal: Nontender with normal range of motion in all extremities.  Neurologic:  Normal speech and language. Skin:  Skin is warm, dry and intact.  Psychiatric: Mood and affect are normal.   ____________________________________________   INITIAL IMPRESSION / ASSESSMENT AND PLAN / ED COURSE  Pertinent labs & imaging results that were available during my care of the patient were reviewed by  me and considered in my medical decision making (see chart for details).   Patient presents to the emergency department for hyperglycemia.  Overall the patient appears well.  Differential would include hyperglycemia, DKA or HHS.  Patient's blood glucose on chemistry is 457.  We will dose 8 units of IV insulin and 1 L of IV fluids and recheck.  As long as the patient's blood glucose decreases preferably below 300 I believe the patient would be safe for discharge home and PCP follow-up.  Normal anion gap, no signs of DKA.  Patient CBG is less than 300.  Patient appears well we will discharge from the emergency department PCP follow-up.  JAKEIM SEDORE was evaluated in Emergency Department on 09/02/2019 for the symptoms described in the history of present illness. He was evaluated in the context of the global COVID-19 pandemic, which necessitated consideration that the patient might be at risk for infection with the SARS-CoV-2 virus that causes COVID-19. Institutional protocols and algorithms that pertain to the evaluation of patients at risk for COVID-19 are in a state of rapid change based on information released by regulatory bodies including the  CDC and federal and state organizations. These policies and algorithms were followed during the patient's care in the ED.  ____________________________________________   FINAL CLINICAL IMPRESSION(S) / ED DIAGNOSES  Hyperglycemia   Harvest Dark, MD 09/02/19 [phone]

## 2019-09-03 LAB — ACID FAST CULTURE WITH REFLEXED SENSITIVITIES (MYCOBACTERIA): Acid Fast Culture: NEGATIVE

## 2019-09-23 ENCOUNTER — Emergency Department
Admission: EM | Admit: 2019-09-23 | Discharge: 2019-09-23 | Disposition: A | Discharge status: Home / Self Care | Payer: No Typology Code available for payment source | Attending: Emergency Medicine | Admitting: Emergency Medicine

## 2019-09-23 ENCOUNTER — Encounter: Payer: Self-pay | Admitting: Emergency Medicine

## 2019-09-23 ENCOUNTER — Other Ambulatory Visit: Payer: Self-pay

## 2019-09-23 DIAGNOSIS — I959 Hypotension, unspecified: Secondary | ICD-10-CM | POA: Diagnosis not present

## 2019-09-23 DIAGNOSIS — Z87891 Personal history of nicotine dependence: Secondary | ICD-10-CM | POA: Insufficient documentation

## 2019-09-23 DIAGNOSIS — Z91040 Latex allergy status: Secondary | ICD-10-CM | POA: Diagnosis not present

## 2019-09-23 DIAGNOSIS — E119 Type 2 diabetes mellitus without complications: Secondary | ICD-10-CM | POA: Diagnosis not present

## 2019-09-23 DIAGNOSIS — R531 Weakness: Secondary | ICD-10-CM | POA: Diagnosis present

## 2019-09-23 DIAGNOSIS — I10 Essential (primary) hypertension: Secondary | ICD-10-CM | POA: Insufficient documentation

## 2019-09-23 DIAGNOSIS — Z794 Long term (current) use of insulin: Secondary | ICD-10-CM | POA: Insufficient documentation

## 2019-09-23 DIAGNOSIS — Z85828 Personal history of other malignant neoplasm of skin: Secondary | ICD-10-CM | POA: Insufficient documentation

## 2019-09-23 LAB — HEPATIC FUNCTION PANEL
ALT: 41 U/L (ref 0–44)
AST: 45 U/L — ABNORMAL HIGH (ref 15–41)
Albumin: 3.7 g/dL (ref 3.5–5.0)
Alkaline Phosphatase: 70 U/L (ref 38–126)
Bilirubin, Direct: <0.1 mg/dL (ref 0.0–0.2)
Total Bilirubin: 0.8 mg/dL (ref 0.3–1.2)
Total Protein: 7.3 g/dL (ref 6.5–8.1)

## 2019-09-23 LAB — BASIC METABOLIC PANEL
Anion gap: 7 (ref 5–15)
BUN: 15 mg/dL (ref 8–23)
CO2: 26 mmol/L (ref 22–32)
Calcium: 9.2 mg/dL (ref 8.9–10.3)
Chloride: 104 mmol/L (ref 98–111)
Creatinine, Ser: 1.17 mg/dL (ref 0.61–1.24)
GFR calc Af Amer: >60 mL/min (ref >60)
GFR calc non Af Amer: >60 mL/min (ref >60)
Glucose, Bld: 237 mg/dL — ABNORMAL HIGH (ref 70–99)
Potassium: 4.3 mmol/L (ref 3.5–5.1)
Sodium: 137 mmol/L (ref 135–145)

## 2019-09-23 LAB — CBC
HCT: 27 % — ABNORMAL LOW (ref 39.0–52.0)
Hemoglobin: 8.7 g/dL — ABNORMAL LOW (ref 13.0–17.0)
MCH: 30 pg (ref 26.0–34.0)
MCHC: 32.2 g/dL (ref 30.0–36.0)
MCV: 93.1 fL (ref 80.0–100.0)
Platelets: 74 10*3/uL — ABNORMAL LOW (ref 150–400)
RBC: 2.9 MIL/uL — ABNORMAL LOW (ref 4.22–5.81)
RDW: 17 % — ABNORMAL HIGH (ref 11.5–15.5)
WBC: 2.3 10*3/uL — ABNORMAL LOW (ref 4.0–10.5)
nRBC: 0 % (ref 0.0–0.2)

## 2019-09-23 LAB — AMMONIA: Ammonia: 43 umol/L — ABNORMAL HIGH (ref 9–35)

## 2019-09-23 NOTE — ED Triage Notes (Signed)
Pt reports BP has been dropping intermittently. Pt reports prior to coming his BP was 88/52 and he feels weak and tired.

## 2019-09-23 NOTE — ED Notes (Signed)
Signature pad in room not working- printed and had pt sign for discharge

## 2019-09-23 NOTE — ED Notes (Signed)
Pt states he had low bp this morning- states he has taken all his meds as usual

## 2019-09-23 NOTE — ED Provider Notes (Signed)
[facility] Emergency Department Provider Note ____________________________________________   First MD Initiated Contact with Patient 09/23/19 1507     (approximate)  I have reviewed the triage vital signs and the nursing notes.   HISTORY  Chief Complaint Hypotension and Weakness    HPI Joshua Berry is a 73 y.o. male with PMH as noted below including liver cirrhosis who presents with low blood pressure, measured to 88/50 today at home and associated with some mild generalized weakness today.  The patient states that he felt well yesterday and has not gotten such a low blood pressure reading before although he sometimes is in the 90s.  He denies any fever chills, vomiting or diarrhea, abdominal pain, shortness of breath, cough, or urinary symptoms.  He denies any new dark stools or abnormal bleeding.  He states that he has been on spironolactone and Lasix and also recently started taking propranolol due to varices.  Past Medical History:  Diagnosis Date  . Arthritis   . Cancer (HCC)    melanoma, carcinoma  . Cirrhosis of liver ([place])   . Diabetes mellitus without complication ([place])   . Heart murmur   . Hypertension   . Iron deficiency anemia   . NASH (nonalcoholic steatohepatitis)   . Sleep apnea   . Spleen enlarged   . Thrombocytopenia [facility])     Patient Active Problem List   Diagnosis Date Noted  . Cirrhosis of liver with ascites ([place])   . Secondary esophageal varices without bleeding ([place])   . Portal hypertension ([place])   . Stomach irritation   . Melena   . Acute gastrointestinal hemorrhage 07/19/2019  . Sepsis ([place]) 12/20/2018    Past Surgical History:  Procedure Laterality Date  . CHOLECYSTECTOMY    . COLONOSCOPY N/A 07/23/2019   Procedure: COLONOSCOPY;  Surgeon: Lin Landsman, MD;  Location[facility] ENDOSCOPY;  Service: Gastroenterology;  Laterality: N/A;  . ESOPHAGOGASTRODUODENOSCOPY N/A 07/20/2019   Procedure:  ESOPHAGOGASTRODUODENOSCOPY (EGD);  Surgeon: Lin Landsman, MD;  Location: [facility] ENDOSCOPY;  Service: Gastroenterology;  Laterality: N/A;  . ESOPHAGOGASTRODUODENOSCOPY Left 08/20/2019   Procedure: ESOPHAGOGASTRODUODENOSCOPY (EGD);  Surgeon: Virgel Manifold, MD;  Location: [facility] ENDOSCOPY;  Service: Endoscopy;  Laterality: Left;  . SKIN BIOPSY    . TEE WITHOUT CARDIOVERSION N/A 12/23/2018   Procedure: TRANSESOPHAGEAL ECHOCARDIOGRAM (TEE);  Surgeon: Teodoro Spray, MD;  Location: [facility] ORS;  Service: Cardiovascular;  Laterality: N/A;    Prior to Admission medications   Medication Sig Start Date End Date Taking? Authorizing Provider  Alogliptin Benzoate (NESINA) 12.5 MG TABS Take 12.5 mg by mouth daily.    [provider]  Alpha-Lipoic Acid 100 MG CAPS Take 1 capsule by mouth daily.    [provider]  atorvastatin (LIPITOR) 10 MG tablet Take 10 mg by mouth daily.    [provider]  B Complex-C (B-COMPLEX WITH VITAMIN C) tablet Take 1 tablet by mouth daily.    [provider]  cholecalciferol (VITAMIN D) 25 MCG (1000 UT) tablet Take 1,000 Units by mouth 2 (two) times daily.     [provider]  folic acid (FOLVITE) 1 MG tablet Take 1 mg by mouth at bedtime.    [provider]  furosemide (LASIX) 40 MG tablet Take 1 tablet (40 mg total) by mouth daily. 07/24/19   Max Sane, MD  insulin aspart protamine- aspart (NOVOLOG MIX 70/30) (70-30) 100 UNIT/ML injection Inject 0.3 mLs (30 Units total) into the skin 2 (two) times daily  with a meal. 08/22/19   Gouru, Aruna, MD  ipratropium-albuterol (DUONEB) 0.5-2.5 (3) MG/3ML SOLN Take 3 mLs by nebulization every 4 (four) hours as needed (shortness of breath).     [provider]  levothyroxine (SYNTHROID, LEVOTHROID) 100 MCG tablet Take 100 mcg by mouth daily before breakfast.    [provider]  magnesium gluconate (MAGONATE) 500 MG tablet Take 500 mg by mouth daily.     [provider]  Melatonin 10 MG TABS Take 10 mg by mouth at bedtime.    [provider]  Omega-3 Fatty Acids (FISH OIL) 1000 MG CAPS Take 1 capsule by mouth at bedtime.     [provider]  pantoprazole (PROTONIX) 40 MG tablet Take 1 tablet (40 mg total) by mouth 2 (two) times daily before a meal. 08/22/19   Gouru, Aruna, MD  Probiotic Product ([employer]S ADV PROBIOTIC GUMMIES PO) Take 1 each by mouth daily.    [provider]  spironolactone (ALDACTONE) 100 MG tablet Take 1 tablet (100 mg total) by mouth daily. 07/24/19   Max Sane, MD  tamsulosin (FLOMAX) 0.4 MG CAPS capsule Take 0.4 mg by mouth daily.     [provider]  vitamin C (ASCORBIC ACID) 500 MG tablet Take 500 mg by mouth daily.    [provider]  Zinc Sulfate 220 (50 Zn) MG TABS Take 110 mg by mouth every evening.     [provider]    Allergies Lisinopril, Sulfamethoxazole, Camphor, Latex, and Nickel  No family history on file.  Social History Social History   Tobacco Use  . Smoking status: Former Research scientist (life sciences)  . Smokeless tobacco: Never Used  Substance Use Topics  . Alcohol use: Not Currently  . Drug use: Not Currently    Review of Systems  Constitutional: No fever. Eyes: No redness. ENT: No sore throat. Cardiovascular: Denies chest pain. Respiratory: Denies shortness of breath. Gastrointestinal: No vomiting or diarrhea.  Genitourinary: Negative for dysuria.  Musculoskeletal: Negative for back pain. Skin: Negative for rash. Neurological: Negative for headache.   ____________________________________________   PHYSICAL EXAM:  VITAL SIGNS: ED Triage Vitals  Enc Vitals Group     BP 09/23/19 1300 120/70     Pulse Rate 09/23/19 1300 (!) 59     Resp 09/23/19 1300 18     Temp 09/23/19 1300 98.2 F (36.8 C)     Temp Source 09/23/19 1300 Oral     SpO2 09/23/19 1508 96 %     Weight 09/23/19 1300 249 lb (112.9 kg)     Height 09/23/19 1300 6' 2"  (1.88 m)     Head  Circumference --      Peak Flow --      Pain Score 09/23/19 1300 0     Pain Loc --      Pain Edu? --      Excl. in [place]? --     Constitutional: Alert and oriented.  Relatively well appearing and in no acute distress. Eyes: Conjunctivae are normal.  No scleral icterus. Head: Atraumatic. Nose: No congestion/rhinnorhea. Mouth/Throat: Mucous membranes are moist.   Neck: Normal range of motion.  Cardiovascular: Normal rate, regular rhythm. Good peripheral circulation. Respiratory: Normal respiratory effort.  No retractions.  Gastrointestinal: Soft and nontender. No distention.  Genitourinary: No flank tenderness. Musculoskeletal: 1+ bilateral lower extremity edema.  Extremities warm and well perfused.  Neurologic:  Normal speech and language. No gross focal neurologic deficits are appreciated.  Skin:  Skin is warm and  dry. No rash noted. Psychiatric: Mood and affect are normal. Speech and behavior are normal.  ____________________________________________   LABS (all labs ordered are listed, but only abnormal results are displayed)  Labs Reviewed  BASIC METABOLIC PANEL - Abnormal; Notable for the following components:      Result Value   Glucose, Bld 237 (*)    All other components within normal limits  CBC - Abnormal; Notable for the following components:   WBC 2.3 (*)    RBC 2.90 (*)    Hemoglobin 8.7 (*)    HCT 27.0 (*)    RDW 17.0 (*)    Platelets 74 (*)    All other components within normal limits  HEPATIC FUNCTION PANEL - Abnormal; Notable for the following components:   AST 45 (*)    All other components within normal limits  AMMONIA - Abnormal; Notable for the following components:   Ammonia 43 (*)    All other components within normal limits  URINALYSIS, COMPLETE (UACMP) WITH MICROSCOPIC   ____________________________________________  EKG  ED ECG REPORT I, Arta Silence, the attending physician, personally viewed and interpreted this ECG.  Date:  09/23/2019 EKG Time: 1301 Rate: 58 Rhythm: normal sinus rhythm QRS Axis: normal Intervals: normal ST/T Wave abnormalities: normal Narrative Interpretation: no evidence of acute ischemia  ____________________________________________  RADIOLOGY    ____________________________________________   PROCEDURES  Procedure(s) performed: No  Procedures  Critical Care performed: No ____________________________________________   INITIAL IMPRESSION / ASSESSMENT AND PLAN / ED COURSE  Pertinent labs & imaging results that were available during my care of the patient were reviewed by me and considered in my medical decision making (see chart for details).  73 year old male with PMH as noted above presents with low blood pressure reading today at home to the 80s/50s along with mild generalized weakness today.  Review of systems is otherwise negative.  The patient states that his blood pressure often runs in the 90s due to some of his medications including spironolactone, Lasix, and propranolol.  I reviewed the past medical records in epic.  The patient was seen in the ED last month for hyperglycemia which resolved.  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  He required a blood transfusion.  On exam today the patient is overall well-appearing.  His vital signs here have been stable, with blood pressure around 110/60.  He has 1+ bilateral lower extremity edema and an otherwise unremarkable exam.  Initial labs reveal a stable hemoglobin of 8.7 and labs otherwise consistent with his baseline.  Overall I suspect that the hypotension is related to his medications especially the propranolol which was started fairly recently.  The blood pressure has returned to his baseline in the ED.  There is no evidence of acute blood loss.  I will add on an ammonia, LFTs, and observe the patient for an additional few hours to make sure that his blood pressure is stable.  We  will also obtain a urinalysis.  ----------------------------------------- 5:44 PM on 09/23/2019 -----------------------------------------  The ammonia is only minimally elevated.  The LFTs are normal.  The patient has not yet been able to provide a urine sample since he states he urinated right before coming to the ED.  He has no fever, dysuria, or any other symptoms to suggest UTI, and thus my clinical suspicion is extremely low.  The patient's blood pressure has remained stable over 4 hours in the ED with no intervention.  I discussed the  work-up with the patient.  He does not want to wait around for a urinalysis and agrees that the chances of UTI causing the low blood pressure are very low.  He would like to go home.  At this time, he appears comfortable and is stable for discharge.  Return precautions given, and he expresses understanding.  ____________________________________________   FINAL CLINICAL IMPRESSION(S) / ED DIAGNOSES  Final diagnoses:  Hypotension, unspecified hypotension type      NEW MEDICATIONS STARTED DURING THIS VISIT:  New Prescriptions   No medications on file     Note:  This document was prepared using Dragon voice recognition software and may include unintentional dictation errors.    Arta Silence, MD 09/23/19 1745

## 2019-09-23 NOTE — Discharge Instructions (Addendum)
Most likely your low blood pressure is related to the medications that you are on for the liver cirrhosis.  Call your doctor tomorrow to arrange for follow-up and discuss if any changes need to be made in the medication regimen.  You can continue to monitor your blood pressure at home a few times per day.  Return to the ER for new, worsening, or persistent weakness, lightheadedness, low blood pressure readings especially persistent readings less than 90 on the top number, any bleeding, significant pain, shortness of breath, fever, urinary symptoms, or any other new or worsening symptoms that concern you.

## 2019-09-23 NOTE — ED Notes (Signed)
Dr Cherylann Banas at bedside

## 2020-02-06 IMAGING — DX DG KNEE COMPLETE 4+V*R*
4 series · 4 of 4 positions shown · non-contrast
Comparison: None.

CLINICAL DATA: Initial evaluation for acute right knee pain and
swelling for 2 days. No injury.

EXAM:
RIGHT KNEE - COMPLETE 4+ VIEW

[knee ap]
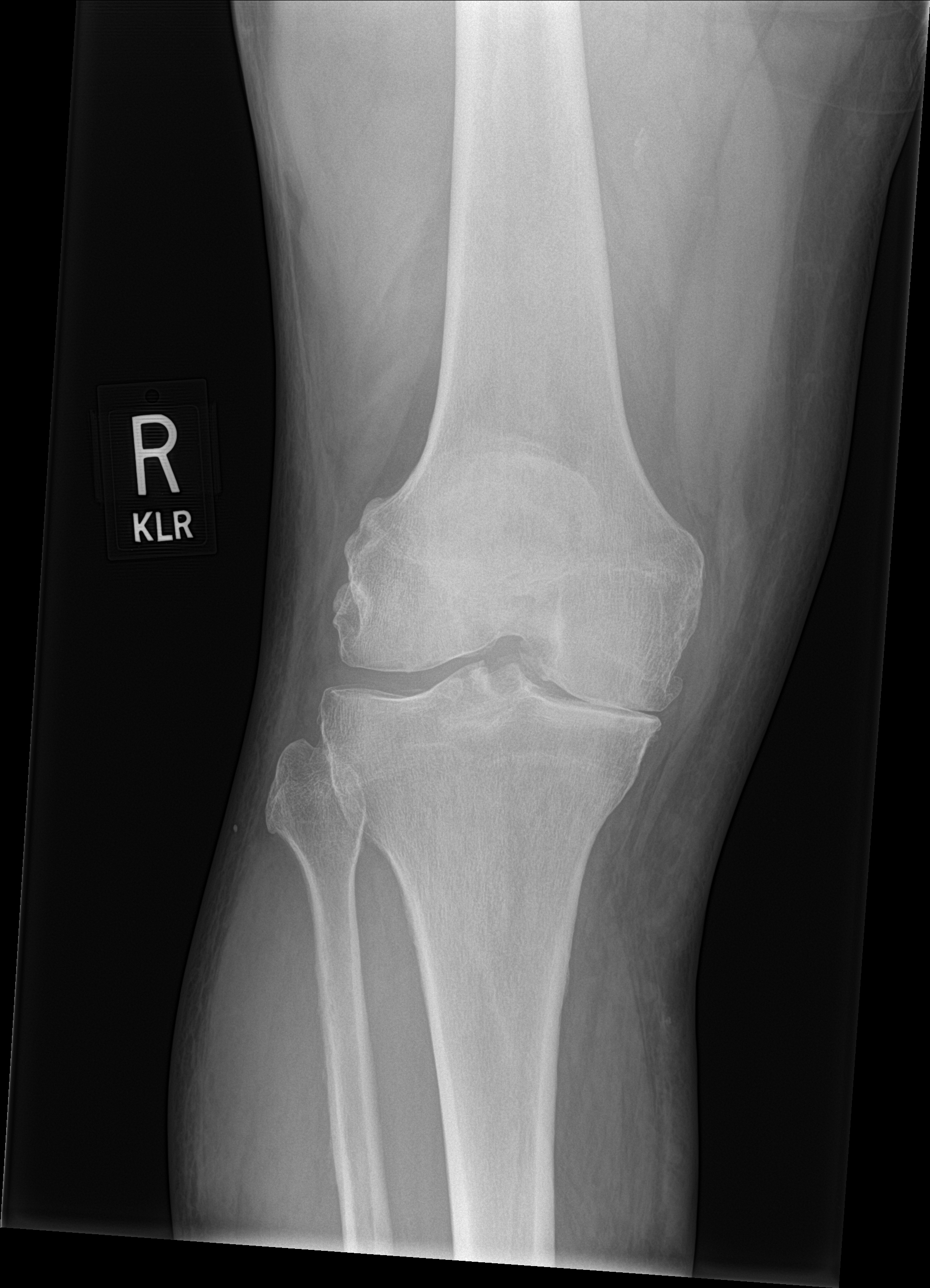

[knee lat]
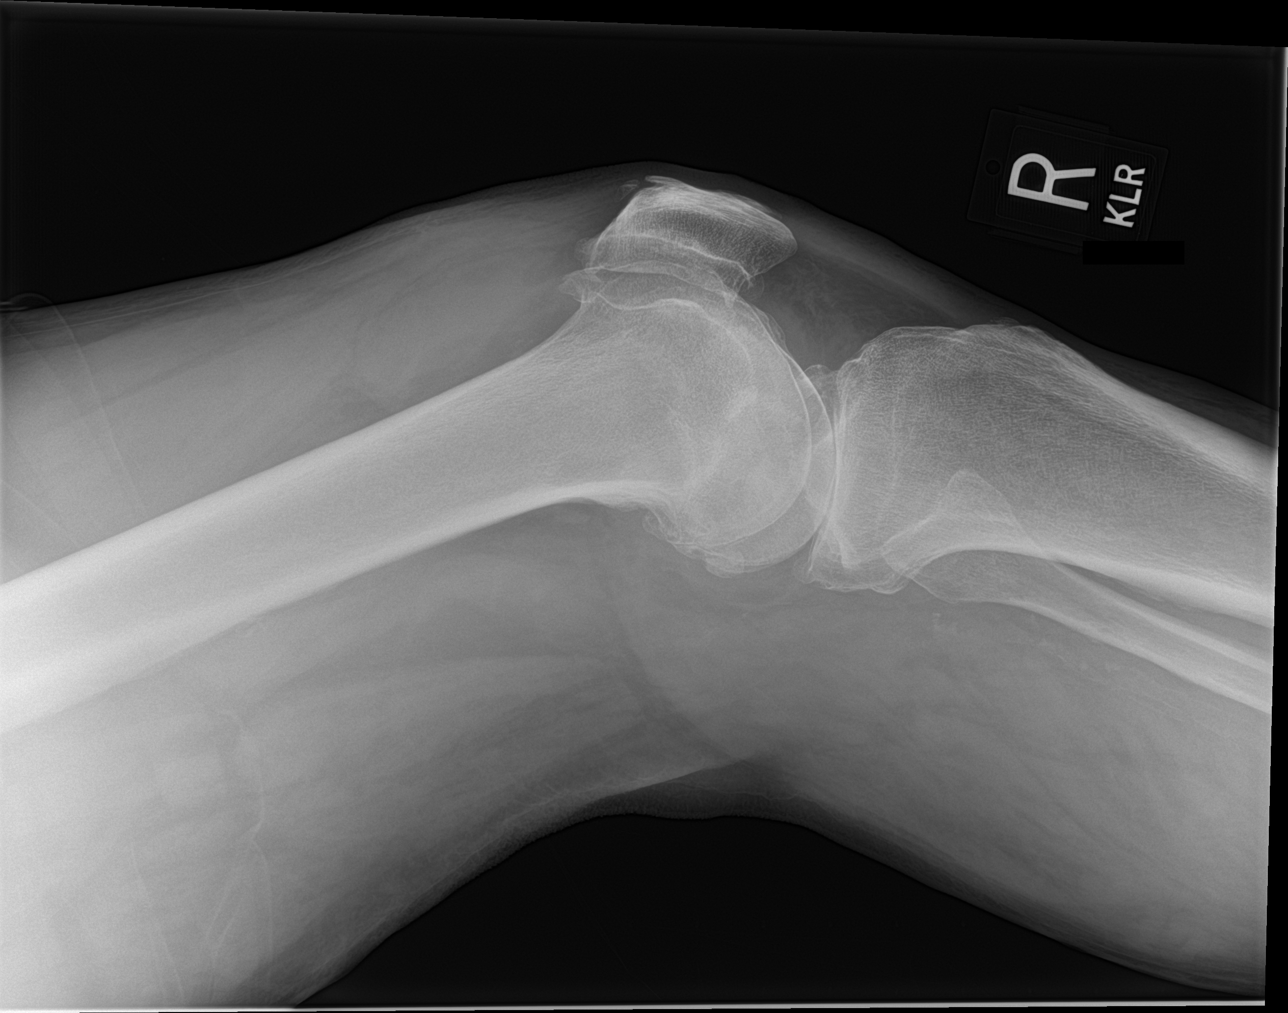

[knee obl (1 of 2)]
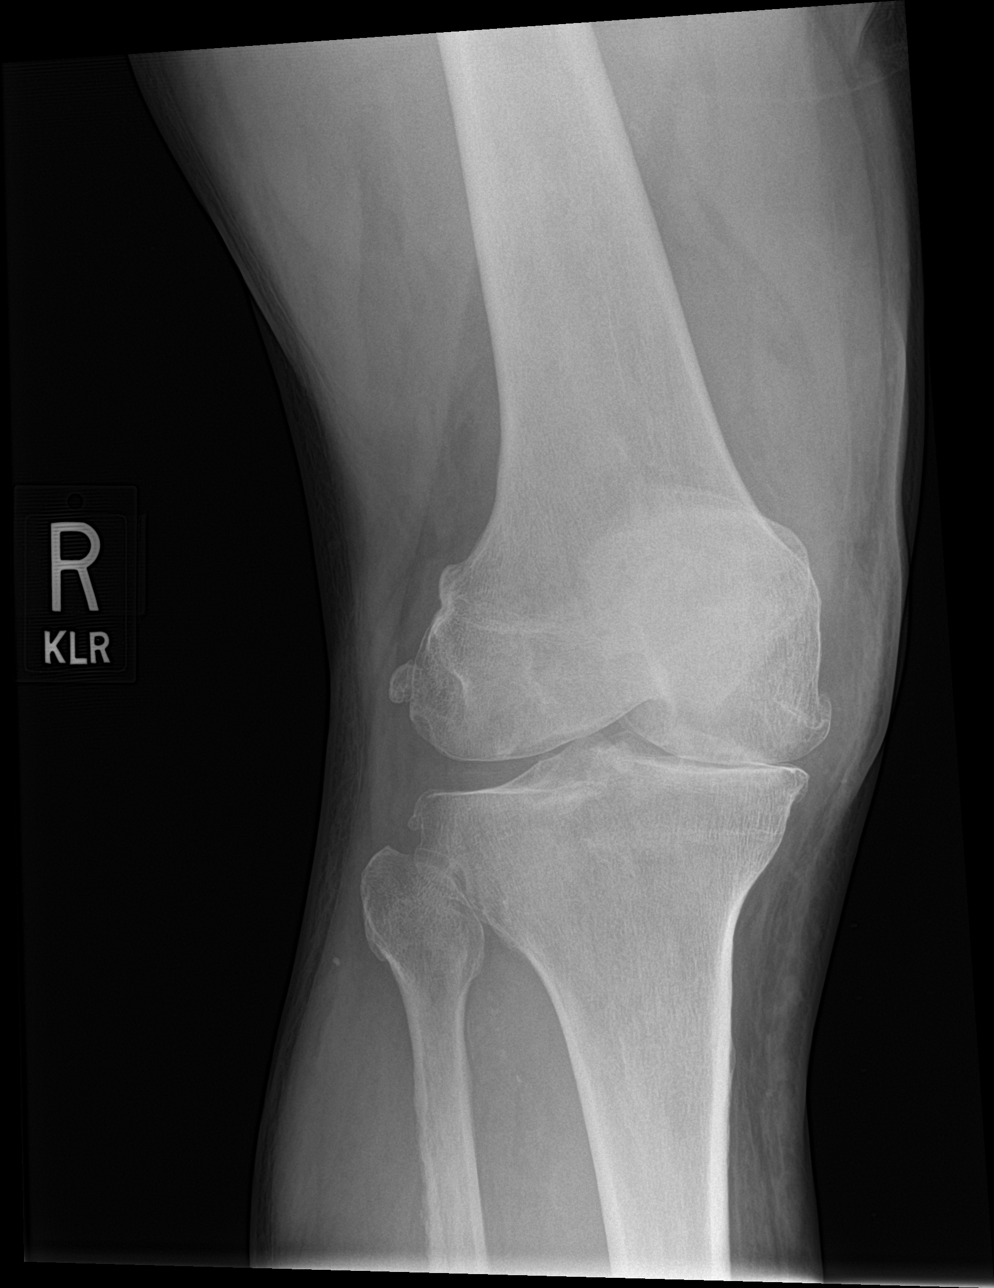

[knee obl (2 of 2)]
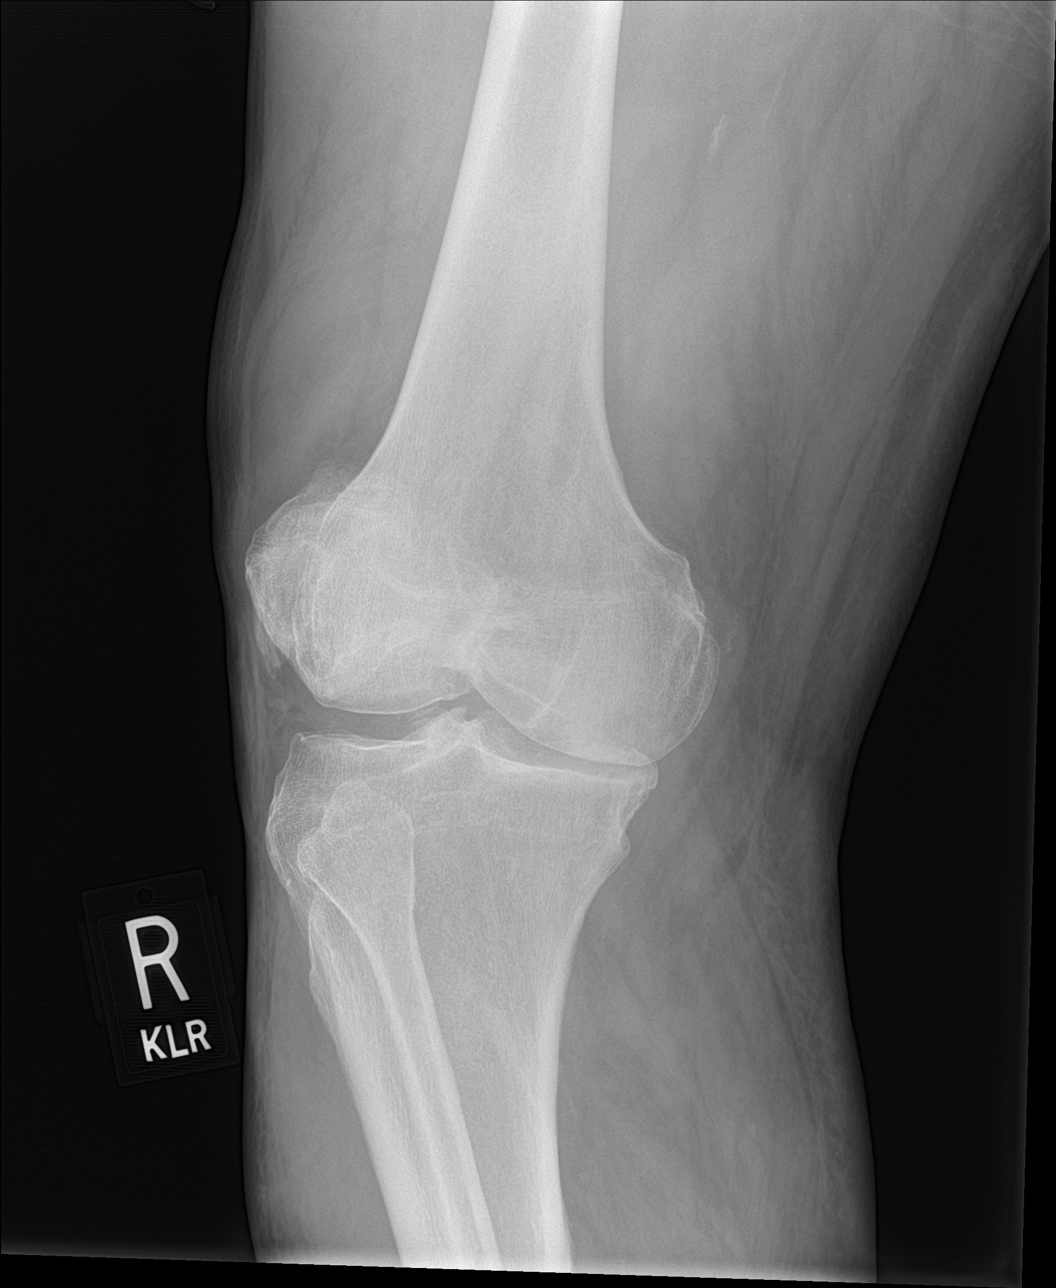

[4 of 4 positions shown; findings below may reference images not displayed]

FINDINGS: No acute fracture or dislocation. Moderate to severe
tricompartmental degenerative osteoarthritic changes. Associated
moderate to large joint effusion. Osseous mineralization normal. No
soft tissue abnormality.
IMPRESSION: 1. No acute osseous abnormality about the knee.
2. Moderate to severe tricompartmental degenerative osteoarthritic
changes with associated moderate to large joint effusion.

## 2020-04-27 ENCOUNTER — Encounter: Payer: Self-pay | Admitting: Family Medicine

## 2020-04-27 NOTE — Telephone Encounter (Signed)
This encounter was created in error - please disregard.

## 2020-05-10 ENCOUNTER — Encounter: Payer: Self-pay | Admitting: Physician Assistant

## 2020-05-10 ENCOUNTER — Other Ambulatory Visit: Payer: Self-pay

## 2020-05-10 ENCOUNTER — Emergency Department
Admission: EM | Admit: 2020-05-10 | Discharge: 2020-05-11 | Disposition: A | Discharge status: Home / Self Care | Payer: No Typology Code available for payment source | Attending: Student | Admitting: Student

## 2020-05-10 DIAGNOSIS — I10 Essential (primary) hypertension: Secondary | ICD-10-CM | POA: Insufficient documentation

## 2020-05-10 DIAGNOSIS — Z79899 Other long term (current) drug therapy: Secondary | ICD-10-CM | POA: Insufficient documentation

## 2020-05-10 DIAGNOSIS — Z87891 Personal history of nicotine dependence: Secondary | ICD-10-CM | POA: Diagnosis not present

## 2020-05-10 DIAGNOSIS — Z794 Long term (current) use of insulin: Secondary | ICD-10-CM | POA: Diagnosis not present

## 2020-05-10 DIAGNOSIS — E119 Type 2 diabetes mellitus without complications: Secondary | ICD-10-CM | POA: Diagnosis not present

## 2020-05-10 DIAGNOSIS — L7621 Postprocedural hemorrhage and hematoma of skin and subcutaneous tissue following a dermatologic procedure: Secondary | ICD-10-CM | POA: Insufficient documentation

## 2020-05-10 DIAGNOSIS — C434 Malignant melanoma of scalp and neck: Secondary | ICD-10-CM | POA: Diagnosis not present

## 2020-05-10 DIAGNOSIS — Z9889 Other specified postprocedural states: Secondary | ICD-10-CM

## 2020-05-10 MED ORDER — TRANEXAMIC ACID 1000 MG/10ML IV SOLN
500.0000 mg | Freq: Once | INTRAVENOUS | Status: AC
  Administered 2020-05-11: 500 mg via TOPICAL
  Filled 2020-05-10: qty 10

## 2020-05-10 NOTE — Discharge Instructions (Addendum)
Dress the wound as directed. Return to the ED as needed.

## 2020-05-10 NOTE — ED Triage Notes (Signed)
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 Pt states it started bleeding at home, saturated gauze. Bleeding seems to be controlled at this time. Pt does have thrombocytopenia.

## 2020-05-10 NOTE — ED Notes (Signed)
Pt reports bleeding is currently "seeping"  Scant amount of fresh sanguinous drainage noted dripping off ear lobe  Pt history of thrombocytopenia and low platelets   Pt reports this problem has happened with previous removal. Previous attempts to stop bleeding included cauterization and stitching, with stitching having more success

## 2020-05-10 NOTE — ED Notes (Signed)
Pt reports procedure today at approx 1100, with bleeding beginning at approx 1500. Pt reports bleeding stopped, and started again at 2000.

## 2020-05-11 NOTE — ED Provider Notes (Signed)
[facility] Emergency Department Provider Note ____________________________________________  Time seen: 2339  I have reviewed the triage vital signs and the nursing notes.  HISTORY  Chief Complaint  Post-op Problem  HPI Joshua Berry is a 74 y.o. male presents to the ED accompanied by his adult daughter, for evaluation of bleeding from  a dermal biopsy spot.  Patient had a carcinoma removed from an area to the parietal side of the scalp just above the right ear.  Patient describes irritated bleeding at home, and saturated the gauze and have been placed by the surgeon.  He presents today for evaluation of the bleeding wound secondary to his history of thrombocytopenia.  Patient denies any fever, chills, or sweats.  Past Medical History:  Diagnosis Date  . Arthritis   . Cancer (HCC)    melanoma, carcinoma  . Cirrhosis of liver ([place])   . Diabetes mellitus without complication ([place])   . Heart murmur   . Hypertension   . Iron deficiency anemia   . NASH (nonalcoholic steatohepatitis)   . Sleep apnea   . Spleen enlarged   . Thrombocytopenia [facility])     Patient Active Problem List   Diagnosis Date Noted  . Cirrhosis of liver with ascites ([place])   . Secondary esophageal varices without bleeding ([place])   . Portal hypertension ([place])   . Stomach irritation   . Melena   . Acute gastrointestinal hemorrhage 07/19/2019  . Sepsis ([place]) 12/20/2018    Past Surgical History:  Procedure Laterality Date  . CHOLECYSTECTOMY    . COLONOSCOPY N/A 07/23/2019   Procedure: COLONOSCOPY;  Surgeon: Lin Landsman, MD;  Location: [facility] ENDOSCOPY;  Service: Gastroenterology;  Laterality: N/A;  . ESOPHAGOGASTRODUODENOSCOPY N/A 07/20/2019   Procedure: ESOPHAGOGASTRODUODENOSCOPY (EGD);  Surgeon: Lin Landsman, MD;  Location: [facility] ENDOSCOPY;  Service: Gastroenterology;  Laterality: N/A;  . ESOPHAGOGASTRODUODENOSCOPY Left 08/20/2019   Procedure: ESOPHAGOGASTRODUODENOSCOPY (EGD);   Surgeon: Virgel Manifold, MD;  Location[facility] ENDOSCOPY;  Service: Endoscopy;  Laterality: Left;  . SKIN BIOPSY    . TEE WITHOUT CARDIOVERSION N/A 12/23/2018   Procedure: TRANSESOPHAGEAL ECHOCARDIOGRAM (TEE);  Surgeon: Teodoro Spray, MD;  Location: [facility] ORS;  Service: Cardiovascular;  Laterality: N/A;    Prior to Admission medications   Medication Sig Start Date End Date Taking? Authorizing Provider  Alogliptin Benzoate (NESINA) 12.5 MG TABS Take 12.5 mg by mouth daily.    [provider]  Alpha-Lipoic Acid 100 MG CAPS Take 1 capsule by mouth daily.    [provider]  atorvastatin (LIPITOR) 10 MG tablet Take 10 mg by mouth daily.    [provider]  B Complex-C (B-COMPLEX WITH VITAMIN C) tablet Take 1 tablet by mouth daily.    [provider]  cholecalciferol (VITAMIN D) 25 MCG (1000 UT) tablet Take 1,000 Units by mouth 2 (two) times daily.     [provider]  folic acid (FOLVITE) 1 MG tablet Take 1 mg by mouth at bedtime.    [provider]  furosemide (LASIX) 40 MG tablet Take 1 tablet (40 mg total) by mouth daily. 07/24/19   Max Sane, MD  insulin aspart protamine- aspart (NOVOLOG MIX 70/30) (70-30) 100 UNIT/ML injection Inject 0.3 mLs (30 Units total) into the skin 2 (two) times daily with a meal. 08/22/19   Gouru, Aruna, MD  ipratropium-albuterol (DUONEB) 0.5-2.5 (3) MG/3ML SOLN Take 3 mLs by nebulization every 4 (four) hours as needed (shortness of breath).     [provider]  levothyroxine (SYNTHROID, LEVOTHROID) 100 MCG tablet Take 100 mcg by mouth daily before breakfast.    [provider]  magnesium gluconate (MAGONATE) 500 MG tablet Take 500 mg by mouth daily.     [provider]  Melatonin 10 MG TABS Take 10 mg by mouth at bedtime.    [provider]  Omega-3 Fatty Acids (FISH OIL) 1000 MG CAPS Take 1 capsule by mouth at bedtime.     [provider]  pantoprazole (PROTONIX) 40 MG  tablet Take 1 tablet (40 mg total) by mouth 2 (two) times daily before a meal. 08/22/19   Gouru, Aruna, MD  Probiotic Product ([employer] ADV PROBIOTIC GUMMIES PO) Take 1 each by mouth daily.    [provider]  spironolactone (ALDACTONE) 100 MG tablet Take 1 tablet (100 mg total) by mouth daily. 07/24/19   Max Sane, MD  tamsulosin (FLOMAX) 0.4 MG CAPS capsule Take 0.4 mg by mouth daily.     [provider]  vitamin C (ASCORBIC ACID) 500 MG tablet Take 500 mg by mouth daily.    [provider]  Zinc Sulfate 220 (50 Zn) MG TABS Take 110 mg by mouth every evening.     [provider]    Allergies Lisinopril, Sulfamethoxazole, Camphor, Latex, and Nickel  History reviewed. No pertinent family history.  Social History Social History   Tobacco Use  . Smoking status: Former Research scientist (life sciences)  . Smokeless tobacco: Never Used  Substance Use Topics  . Alcohol use: Not Currently  . Drug use: Not Currently    Review of Systems  Constitutional: Negative for fever. Eyes: Negative for visual changes. ENT: Negative for sore throat. Cardiovascular: Negative for chest pain. Respiratory: Negative for shortness of breath. Musculoskeletal: Negative for back pain. Skin: Negative for rash.  Bleeding excisional biopsy site Neurological: Negative for headaches, focal weakness or numbness. ____________________________________________  PHYSICAL EXAM:  VITAL SIGNS: ED Triage Vitals [05/10/20 2216]  Enc Vitals Group     BP (!) 109/51     Pulse Rate 63     Resp 20     Temp 98.4 F (36.9 C)     Temp Source Oral     SpO2 100 %     Weight 245 lb (111.1 kg)     Height 6' 2"  (1.88 m)     Head Circumference      Peak Flow      Pain Score 0     Pain Loc      Pain Edu?      Excl. in [place]?     Constitutional: Alert and oriented. Well appearing and in no distress. Head: Normocephalic and atraumatic, except for a area approximately 2 cm in diameter consistent with a recent excisional  biopsy.  There is Telfa cut to size, and placed with an the wound bed, and a large clot is visible and present at this time.  The Telfa thoroughly soaked, but well adhered to the wound bed with a large clot. Eyes: Conjunctivae are normal. Normal extraocular movements Cardiovascular: Normal rate, regular rhythm. Normal distal pulses. Respiratory: Normal respiratory effort. No wheezes/rales/rhonchi. Gastrointestinal: Soft and nontender. No distention. Musculoskeletal: Nontender with normal range of motion in all extremities.  Neurologic:  Normal gait without ataxia. Normal speech and language. No gross focal neurologic deficits are appreciated. Skin:  Skin is warm, dry and intact. No rash noted. ____________________________________________  PROCEDURES  TXA - soaked cotton swab applied around the intact Telfa patch Tegaderm placer over  the dressing.   Procedures ____________________________________________  INITIAL IMPRESSION / ASSESSMENT AND PLAN / ED COURSE  Patient presents to the ED for evaluation management of excessive bleeding from a recent excisional biopsies location.  The area to the right parietal side of the scalp was undressed down to the Telfa patch that was placed in the wound.  I did not disrupt the clot that was in place, instead opting to apply a TXA soaked cotton ball around the wound dressing.  Tegaderm was placed over the area to help retain moisture.  Patient is discharged with wound care instructions and advised to change dressing tomorrow as planned.  He should return to the ED for worsening symptoms.  Plans to follow-up with his dermatologist/oncologist as planned.  AERO DRUMMONDS was evaluated in Emergency Department on 05/11/2020 for the symptoms described in the history of present illness. He was evaluated in the context of the global COVID-19 pandemic, which necessitated consideration that the patient might be at risk for infection with the SARS-CoV-2 virus that causes  COVID-19. Institutional protocols and algorithms that pertain to the evaluation of patients at risk for COVID-19 are in a state of rapid change based on information released by regulatory bodies including the CDC and federal and state organizations. These policies and algorithms were followed during the patient's care in the ED. ____________________________________________  FINAL CLINICAL IMPRESSION(S) / ED DIAGNOSES  Final diagnoses:  H/O surgical biopsy      Melvenia Needles, PA-C 05/11/20 0053    Lilia Pro., MD 05/11/20 1052

## 2021-03-08 DIAGNOSIS — K922 Gastrointestinal hemorrhage, unspecified: Secondary | ICD-10-CM | POA: Diagnosis not present

## 2021-04-24 ENCOUNTER — Inpatient Hospital Stay
Admission: EM | Admit: 2021-04-24 | Discharge: 2021-04-26 | DRG: 378 | Disposition: A | Discharge status: Home / Self Care | Payer: No Typology Code available for payment source | Attending: Family Medicine | Admitting: Family Medicine

## 2021-04-24 ENCOUNTER — Encounter: Payer: Self-pay | Admitting: Emergency Medicine

## 2021-04-24 DIAGNOSIS — E119 Type 2 diabetes mellitus without complications: Secondary | ICD-10-CM | POA: Diagnosis present

## 2021-04-24 DIAGNOSIS — Z87891 Personal history of nicotine dependence: Secondary | ICD-10-CM

## 2021-04-24 DIAGNOSIS — K766 Portal hypertension: Secondary | ICD-10-CM | POA: Diagnosis present

## 2021-04-24 DIAGNOSIS — Z9109 Other allergy status, other than to drugs and biological substances: Secondary | ICD-10-CM

## 2021-04-24 DIAGNOSIS — K7290 Hepatic failure, unspecified without coma: Secondary | ICD-10-CM | POA: Diagnosis present

## 2021-04-24 DIAGNOSIS — G4733 Obstructive sleep apnea (adult) (pediatric): Secondary | ICD-10-CM | POA: Diagnosis present

## 2021-04-24 DIAGNOSIS — K3189 Other diseases of stomach and duodenum: Secondary | ICD-10-CM | POA: Diagnosis present

## 2021-04-24 DIAGNOSIS — I350 Nonrheumatic aortic (valve) stenosis: Secondary | ICD-10-CM | POA: Diagnosis present

## 2021-04-24 DIAGNOSIS — Z86010 Personal history of colonic polyps: Secondary | ICD-10-CM

## 2021-04-24 DIAGNOSIS — K922 Gastrointestinal hemorrhage, unspecified: Secondary | ICD-10-CM | POA: Diagnosis not present

## 2021-04-24 DIAGNOSIS — I10 Essential (primary) hypertension: Secondary | ICD-10-CM | POA: Diagnosis present

## 2021-04-24 DIAGNOSIS — E039 Hypothyroidism, unspecified: Secondary | ICD-10-CM | POA: Diagnosis present

## 2021-04-24 DIAGNOSIS — Z20822 Contact with and (suspected) exposure to covid-19: Secondary | ICD-10-CM | POA: Diagnosis present

## 2021-04-24 DIAGNOSIS — Z91040 Latex allergy status: Secondary | ICD-10-CM

## 2021-04-24 DIAGNOSIS — R161 Splenomegaly, not elsewhere classified: Secondary | ICD-10-CM | POA: Diagnosis present

## 2021-04-24 DIAGNOSIS — D62 Acute posthemorrhagic anemia: Secondary | ICD-10-CM | POA: Diagnosis present

## 2021-04-24 DIAGNOSIS — K254 Chronic or unspecified gastric ulcer with hemorrhage: Principal | ICD-10-CM | POA: Diagnosis present

## 2021-04-24 DIAGNOSIS — R531 Weakness: Secondary | ICD-10-CM | POA: Diagnosis present

## 2021-04-24 DIAGNOSIS — D649 Anemia, unspecified: Secondary | ICD-10-CM

## 2021-04-24 DIAGNOSIS — Z79890 Hormone replacement therapy (postmenopausal): Secondary | ICD-10-CM

## 2021-04-24 DIAGNOSIS — Z79899 Other long term (current) drug therapy: Secondary | ICD-10-CM

## 2021-04-24 DIAGNOSIS — K7581 Nonalcoholic steatohepatitis (NASH): Secondary | ICD-10-CM | POA: Diagnosis present

## 2021-04-24 DIAGNOSIS — R011 Cardiac murmur, unspecified: Secondary | ICD-10-CM | POA: Diagnosis present

## 2021-04-24 DIAGNOSIS — Z794 Long term (current) use of insulin: Secondary | ICD-10-CM | POA: Diagnosis not present

## 2021-04-24 DIAGNOSIS — Z8249 Family history of ischemic heart disease and other diseases of the circulatory system: Secondary | ICD-10-CM

## 2021-04-24 DIAGNOSIS — D500 Iron deficiency anemia secondary to blood loss (chronic): Secondary | ICD-10-CM | POA: Diagnosis not present

## 2021-04-24 DIAGNOSIS — Z882 Allergy status to sulfonamides status: Secondary | ICD-10-CM

## 2021-04-24 DIAGNOSIS — R9431 Abnormal electrocardiogram [ECG] [EKG]: Secondary | ICD-10-CM | POA: Diagnosis present

## 2021-04-24 DIAGNOSIS — Z85820 Personal history of malignant melanoma of skin: Secondary | ICD-10-CM | POA: Diagnosis not present

## 2021-04-24 DIAGNOSIS — E785 Hyperlipidemia, unspecified: Secondary | ICD-10-CM | POA: Diagnosis present

## 2021-04-24 DIAGNOSIS — K7460 Unspecified cirrhosis of liver: Secondary | ICD-10-CM | POA: Diagnosis present

## 2021-04-24 DIAGNOSIS — D509 Iron deficiency anemia, unspecified: Secondary | ICD-10-CM | POA: Diagnosis present

## 2021-04-24 DIAGNOSIS — D696 Thrombocytopenia, unspecified: Secondary | ICD-10-CM | POA: Diagnosis present

## 2021-04-24 DIAGNOSIS — Z833 Family history of diabetes mellitus: Secondary | ICD-10-CM

## 2021-04-24 DIAGNOSIS — Z888 Allergy status to other drugs, medicaments and biological substances status: Secondary | ICD-10-CM

## 2021-04-24 LAB — CBC
HCT: 22.9 % — ABNORMAL LOW (ref 39.0–52.0)
Hemoglobin: 7.5 g/dL — ABNORMAL LOW (ref 13.0–17.0)
MCH: 31.4 pg (ref 26.0–34.0)
MCHC: 32.8 g/dL (ref 30.0–36.0)
MCV: 95.8 fL (ref 80.0–100.0)
Platelets: 82 10*3/uL — ABNORMAL LOW (ref 150–400)
RBC: 2.39 MIL/uL — ABNORMAL LOW (ref 4.22–5.81)
RDW: 20.7 % — ABNORMAL HIGH (ref 11.5–15.5)
WBC: 2.1 10*3/uL — ABNORMAL LOW (ref 4.0–10.5)
nRBC: 0 % (ref 0.0–0.2)

## 2021-04-24 LAB — URINALYSIS, COMPLETE (UACMP) WITH MICROSCOPIC
Bacteria, UA: NONE SEEN
Bilirubin Urine: NEGATIVE
Glucose, UA: NEGATIVE mg/dL
Hgb urine dipstick: NEGATIVE
Ketones, ur: NEGATIVE mg/dL
Leukocytes,Ua: NEGATIVE
Nitrite: NEGATIVE
Protein, ur: NEGATIVE mg/dL
Specific Gravity, Urine: 1.019 (ref 1.005–1.030)
Squamous Epithelial / HPF: NONE SEEN (ref 0–5)
pH: 7 (ref 5.0–8.0)

## 2021-04-24 LAB — BASIC METABOLIC PANEL
Anion gap: 7 (ref 5–15)
BUN: 15 mg/dL (ref 8–23)
CO2: 24 mmol/L (ref 22–32)
Calcium: 8.4 mg/dL — ABNORMAL LOW (ref 8.9–10.3)
Chloride: 108 mmol/L (ref 98–111)
Creatinine, Ser: 1.02 mg/dL (ref 0.61–1.24)
GFR, Estimated: >60 mL/min (ref >60)
Glucose, Bld: 168 mg/dL — ABNORMAL HIGH (ref 70–99)
Potassium: 3.7 mmol/L (ref 3.5–5.1)
Sodium: 139 mmol/L (ref 135–145)

## 2021-04-24 LAB — TROPONIN I (HIGH SENSITIVITY): Troponin I (High Sensitivity): 9 ng/L (ref <18)

## 2021-04-24 MED ORDER — LACTULOSE 10 GM/15ML PO SOLN
10.0000 g | Freq: Three times a day (TID) | ORAL | Status: DC
  Administered 2021-04-25 – 2021-04-26 (×4): 10 g via ORAL
  Filled 2021-04-24 (×4): qty 30

## 2021-04-24 MED ORDER — SODIUM CHLORIDE 0.9 % IV SOLN
80.0000 mg | Freq: Once | INTRAVENOUS | Status: AC
  Administered 2021-04-25: 80 mg via INTRAVENOUS
  Filled 2021-04-24: qty 80

## 2021-04-24 MED ORDER — VITAMIN D3 25 MCG (1000 UNIT) PO TABS
1000.0000 [IU] | ORAL_TABLET | Freq: Two times a day (BID) | ORAL | Status: DC
  Administered 2021-04-25 – 2021-04-26 (×3): 1000 [IU] via ORAL
  Filled 2021-04-24 (×8): qty 1

## 2021-04-24 MED ORDER — SODIUM CHLORIDE 0.9 % IV SOLN: INTRAVENOUS | Status: DC

## 2021-04-24 MED ORDER — SODIUM CHLORIDE 0.9 % IV SOLN
8.0000 mg/h | INTRAVENOUS | Status: DC
  Administered 2021-04-25 – 2021-04-26 (×3): 8 mg/h via INTRAVENOUS
  Filled 2021-04-24 (×3): qty 80

## 2021-04-24 MED ORDER — TERAZOSIN HCL 5 MG PO CAPS
5.0000 mg | ORAL_CAPSULE | Freq: Every day | ORAL | Status: DC
  Administered 2021-04-25: 5 mg via ORAL
  Filled 2021-04-24 (×3): qty 1

## 2021-04-24 MED ORDER — FOLIC ACID 1 MG PO TABS
1.0000 mg | ORAL_TABLET | Freq: Every day | ORAL | Status: DC
  Administered 2021-04-25: 21:00:00 1 mg via ORAL
  Filled 2021-04-24: qty 1

## 2021-04-24 MED ORDER — FERROUS SULFATE 325 (65 FE) MG PO TABS
325.0000 mg | ORAL_TABLET | Freq: Every day | ORAL | Status: DC
  Administered 2021-04-25 – 2021-04-26 (×2): 325 mg via ORAL
  Filled 2021-04-24 (×2): qty 1

## 2021-04-24 MED ORDER — FOLIC ACID 1 MG PO TABS: 1.0000 mg | ORAL_TABLET | Freq: Every day | ORAL | Status: DC

## 2021-04-24 MED ORDER — LEVOTHYROXINE SODIUM 100 MCG PO TABS
100.0000 ug | ORAL_TABLET | Freq: Every day | ORAL | Status: DC
  Administered 2021-04-26: 05:00:00 100 ug via ORAL
  Filled 2021-04-24: qty 1

## 2021-04-24 MED ORDER — RIFAXIMIN 550 MG PO TABS
550.0000 mg | ORAL_TABLET | Freq: Two times a day (BID) | ORAL | Status: DC
  Administered 2021-04-25 – 2021-04-26 (×3): 550 mg via ORAL
  Filled 2021-04-24 (×3): qty 1

## 2021-04-24 MED ORDER — SODIUM CHLORIDE 0.9 % IV SOLN
1.0000 g | Freq: Once | INTRAVENOUS | Status: AC
  Administered 2021-04-24: 1 g via INTRAVENOUS
  Filled 2021-04-24: qty 10

## 2021-04-24 MED ORDER — MAGNESIUM HYDROXIDE 400 MG/5ML PO SUSP
30.0000 mL | Freq: Every day | ORAL | Status: DC | PRN
  Filled 2021-04-24: qty 30

## 2021-04-24 MED ORDER — INSULIN ASPART 100 UNIT/ML IJ SOLN
0.0000 [IU] | Freq: Three times a day (TID) | INTRAMUSCULAR | Status: DC
  Administered 2021-04-25: 17:00:00 2 [IU] via SUBCUTANEOUS
  Administered 2021-04-25 – 2021-04-26 (×2): 1 [IU] via SUBCUTANEOUS
  Filled 2021-04-24 (×3): qty 1

## 2021-04-24 MED ORDER — OCTREOTIDE ACETATE 50 MCG/ML IJ SOLN
50.0000 ug | Freq: Once | INTRAMUSCULAR | Status: AC
  Administered 2021-04-25: 50 ug via INTRAVENOUS
  Filled 2021-04-24: qty 1

## 2021-04-24 MED ORDER — INSULIN GLARGINE 100 UNIT/ML ~~LOC~~ SOLN
32.0000 [IU] | Freq: Every day | SUBCUTANEOUS | Status: DC
  Filled 2021-04-24: qty 0.32

## 2021-04-24 MED ORDER — POLYVINYL ALCOHOL 1.4 % OP SOLN
1.0000 [drp] | Freq: Four times a day (QID) | OPHTHALMIC | Status: DC
  Administered 2021-04-25 – 2021-04-26 (×3): 1 [drp] via OPHTHALMIC
  Filled 2021-04-24: qty 15

## 2021-04-24 MED ORDER — ASCORBIC ACID 500 MG PO TABS
500.0000 mg | ORAL_TABLET | Freq: Every day | ORAL | Status: DC
  Administered 2021-04-25 – 2021-04-26 (×2): 500 mg via ORAL
  Filled 2021-04-24 (×2): qty 1

## 2021-04-24 MED ORDER — IPRATROPIUM-ALBUTEROL 0.5-2.5 (3) MG/3ML IN SOLN: 3.0000 mL | RESPIRATORY_TRACT | Status: DC | PRN

## 2021-04-24 MED ORDER — KETOTIFEN FUMARATE 0.025 % OP SOLN
1.0000 [drp] | Freq: Two times a day (BID) | OPHTHALMIC | Status: DC
  Administered 2021-04-25 – 2021-04-26 (×2): 1 [drp] via OPHTHALMIC
  Filled 2021-04-24: qty 5

## 2021-04-24 MED ORDER — RISAQUAD PO CAPS
1.0000 | ORAL_CAPSULE | Freq: Every day | ORAL | Status: DC
  Administered 2021-04-25 – 2021-04-26 (×2): 1 via ORAL
  Filled 2021-04-24 (×2): qty 1

## 2021-04-24 MED ORDER — SEMAGLUTIDE(0.25 OR 0.5MG/DOS) 2 MG/1.5ML ~~LOC~~ SOPN: 0.5000 mg | PEN_INJECTOR | SUBCUTANEOUS | Status: DC

## 2021-04-24 MED ORDER — ADULT MULTIVITAMIN W/MINERALS CH
1.0000 | ORAL_TABLET | Freq: Every day | ORAL | Status: DC
  Administered 2021-04-25 – 2021-04-26 (×2): 1 via ORAL
  Filled 2021-04-24 (×2): qty 1

## 2021-04-24 MED ORDER — ONDANSETRON HCL 4 MG/2ML IJ SOLN: 4.0000 mg | Freq: Four times a day (QID) | INTRAMUSCULAR | Status: DC | PRN

## 2021-04-24 MED ORDER — SODIUM CHLORIDE 0.9 % IV SOLN
50.0000 ug/h | INTRAVENOUS | Status: DC
  Administered 2021-04-25 (×3): 50 ug/h via INTRAVENOUS
  Filled 2021-04-24 (×7): qty 1

## 2021-04-24 MED ORDER — THIAMINE HCL 100 MG PO TABS
100.0000 mg | ORAL_TABLET | Freq: Every day | ORAL | Status: DC
  Administered 2021-04-25 – 2021-04-26 (×2): 100 mg via ORAL
  Filled 2021-04-24 (×2): qty 1

## 2021-04-24 MED ORDER — PANTOPRAZOLE SODIUM 40 MG IV SOLR: 40.0000 mg | Freq: Two times a day (BID) | INTRAVENOUS | Status: DC

## 2021-04-24 MED ORDER — ONDANSETRON HCL 4 MG PO TABS: 4.0000 mg | ORAL_TABLET | Freq: Four times a day (QID) | ORAL | Status: DC | PRN

## 2021-04-24 MED ORDER — ALBUTEROL SULFATE (2.5 MG/3ML) 0.083% IN NEBU: 2.5000 mg | INHALATION_SOLUTION | Freq: Four times a day (QID) | RESPIRATORY_TRACT | Status: DC | PRN

## 2021-04-24 MED ORDER — ATORVASTATIN CALCIUM 20 MG PO TABS
20.0000 mg | ORAL_TABLET | Freq: Every day | ORAL | Status: DC
  Administered 2021-04-25 – 2021-04-26 (×2): 20 mg via ORAL
  Filled 2021-04-24 (×2): qty 1

## 2021-04-24 MED ORDER — POLYVINYL ALCOHOL 1.4 % OP SOLN
1.0000 [drp] | Freq: Every day | OPHTHALMIC | Status: DC
  Filled 2021-04-24: qty 15

## 2021-04-24 NOTE — ED Notes (Signed)
Pt given a warm blanket 

## 2021-04-24 NOTE — H&P (Signed)
[place]   PATIENT NAME: Joshua Berry    MR#:  [id number]  DATE OF BIRTH:  1946/04/09  DATE OF ADMISSION:  04/24/2021  PRIMARY CARE PHYSICIAN: Joshua Pont, MD   Patient is coming from: Home  REQUESTING/REFERRING PHYSICIAN: Nance Pear, MD  CHIEF COMPLAINT:   Chief Complaint  Patient presents with  . Weakness    HISTORY OF PRESENT ILLNESS:  Joshua Berry is a 75 y.o. male with medical history significant for history of liver cirrhosis, type 2 diabetes mellitus, hypertension and sleep apnea, who presented to the emergency room with acute onset of generalized weakness over the last 3 days.  He has noticed his stools were dark brown but denies any bright red bleeding per rectum.  He admits to dyspnea on exertion without orthopnea or paroxysmal nocturnal dyspnea.  He had no significant worsening of his lower extremity edema.  Stool Hemoccult was positive in the ER by the ER physician.  No chest pain or dyspnea at rest or palpitations.  No cough or wheezing hemoptysis.  No dysuria, oliguria or hematuria or flank pain.  No other bleeding diathesis.  He is status post TIPS procedure at [facility] in December 2021.  ED Course: Upon presentation to the ER, blood pressure was 119/57 with otherwise normal vital signs.  Labs revealed blood glucose of 168 and CBC showing hemoglobin of 7.5 hematocrit 22.9 with WBC of 2.1 and platelets of 82.  H&H were 8.7 and 27 on 09/23/2019 however the patient stated that she had a recent hemoglobin that was above 8. EKG as reviewed by me : Showed sinus rhythm rate of 74 with prolonged PR interval and borderline left axis deviation with borderline prolonged QT interval with QTC of 494 MS.  The patient was given IV Rocephin, IV Protonix bolus and drip, IV octreotide bolus and drip. PAST MEDICAL HISTORY:   Past Medical History:  Diagnosis Date  . Arthritis   . Cancer (HCC)    melanoma, carcinoma  . Cirrhosis of liver ([place])   . Diabetes  mellitus without complication ([place])   . Heart murmur   . Hypertension   . Iron deficiency anemia   . NASH (nonalcoholic steatohepatitis)   . Sleep apnea   . Spleen enlarged   . Thrombocytopenia ([place])     PAST SURGICAL HISTORY:   Past Surgical History:  Procedure Laterality Date  . CHOLECYSTECTOMY    . COLONOSCOPY N/A 07/23/2019   Procedure: COLONOSCOPY;  Surgeon: Joshua Landsman, MD;  Location: [facility] ENDOSCOPY;  Service: Gastroenterology;  Laterality: N/A;  . ESOPHAGOGASTRODUODENOSCOPY N/A 07/20/2019   Procedure: ESOPHAGOGASTRODUODENOSCOPY (EGD);  Surgeon: Joshua Landsman, MD;  Location: [facility] ENDOSCOPY;  Service: Gastroenterology;  Laterality: N/A;  . ESOPHAGOGASTRODUODENOSCOPY Left 08/20/2019   Procedure: ESOPHAGOGASTRODUODENOSCOPY (EGD);  Surgeon: Joshua Manifold, MD;  Location: [facility] ENDOSCOPY;  Service: Endoscopy;  Laterality: Left;  . SKIN BIOPSY    . TEE WITHOUT CARDIOVERSION N/A 12/23/2018   Procedure: TRANSESOPHAGEAL ECHOCARDIOGRAM (TEE);  Surgeon: Joshua Spray, MD;  Location: [facility] ORS;  Service: Cardiovascular;  Laterality: N/A;  -[facility] 2021  SOCIAL HISTORY:   Social History   Tobacco Use  . Smoking status: Former Research scientist (life sciences)  . Smokeless tobacco: Never Used  Substance Use Topics  . Alcohol use: Not Currently    FAMILY HISTORY:  Positive for diabetes mellitus in his mother and MI in his father. DRUG ALLERGIES:   Allergies  Allergen Reactions  . Lisinopril Hives  .  Sulfamethoxazole Swelling  . Camphor Rash and Swelling  . Latex Rash  . Nickel Rash    REVIEW OF SYSTEMS:   ROS As per history of present illness. All pertinent systems were reviewed above. Constitutional, HEENT, cardiovascular, respiratory, GI, GU, musculoskeletal, neuro, psychiatric, endocrine, integumentary and hematologic systems were reviewed and are otherwise negative/unremarkable except for positive findings mentioned above in the  HPI.   MEDICATIONS AT HOME:   Prior to Admission medications   Medication Sig Start Date End Date Taking? Authorizing Provider  albuterol (VENTOLIN HFA) 108 (90 Base) MCG/ACT inhaler Inhale 1-2 puffs into the lungs every 6 (six) hours as needed for wheezing or shortness of breath.   Yes [provider]  atorvastatin (LIPITOR) 10 MG tablet Take 20 mg by mouth daily.   Yes [provider]  augmented betamethasone dipropionate (DIPROLENE-AF) 0.05 % cream Apply 1 application topically 2 (two) times daily.   Yes [provider]  carboxymethylcellulose (REFRESH PLUS) 0.5 % SOLN 1 drop at bedtime.   Yes [provider]  cholecalciferol (VITAMIN D) 25 MCG (1000 UT) tablet Take 1,000 Units by mouth 2 (two) times daily.    Yes [provider]  ferrous sulfate 325 (65 FE) MG tablet Take 325 mg by mouth daily with breakfast.   Yes [provider]  folic acid (FOLVITE) 1 MG tablet Take 1 mg by mouth at bedtime.   Yes [provider]  furosemide (LASIX) 40 MG tablet Take 1 tablet (40 mg total) by mouth daily. 07/24/19  Yes Joshua Sane, MD  Glycerin-Hypromellose-PEG 400 0.2-0.2-1 % SOLN Apply 1 drop to eye 4 (four) times daily.   Yes [provider]  insulin aspart (NOVOLOG) 100 UNIT/ML injection Inject 26 Units into the skin 3 (three) times daily before meals.   Yes [provider]  insulin glargine (LANTUS) 100 UNIT/ML injection Inject 32 Units into the skin daily.   Yes [provider]  ipratropium-albuterol (DUONEB) 0.5-2.5 (3) MG/3ML SOLN Take 3 mLs by nebulization every 4 (four) hours as needed (shortness of breath).    Yes [provider]  ketotifen (ZADITOR) 0.025 % ophthalmic solution Place 1 drop into both eyes 2 (two) times daily.   Yes [provider]  lactulose (CHRONULAC) 10 GM/15ML solution Take 10 g by mouth 3 (three) times daily.   Yes [provider]  levothyroxine (SYNTHROID,  LEVOTHROID) 100 MCG tablet Take 100 mcg by mouth daily before breakfast.   Yes [provider]  MAGNESIUM GLUCONATE PO Take 420 mg by mouth daily.   Yes [provider]  omeprazole (PRILOSEC) 20 MG capsule Take 20 mg by mouth daily.   Yes [provider]  Probiotic Product ([employer]S ADV PROBIOTIC GUMMIES PO) Take 1 each by mouth daily.   Yes [provider]  rifaximin (XIFAXAN) 550 MG TABS tablet Take 550 mg by mouth 2 (two) times daily.   Yes [provider]  Semaglutide,0.25 or 0.5MG/DOS, 2 MG/1.5ML SOPN Inject 0.5 mg into the skin once a week.   Yes [provider]  terazosin (HYTRIN) 5 MG capsule Take 5 mg by mouth at bedtime.   Yes [provider]  vitamin C (ASCORBIC ACID) 500 MG tablet Take 500 mg by mouth daily.   Yes [provider]      VITAL SIGNS:  Blood pressure 122/63, pulse 65, temperature 98.4 F (36.9 C), temperature source Oral, resp. rate 13, SpO2 99 %.  PHYSICAL EXAMINATION:  Physical Exam  GENERAL:  75 y.o.-year-old male patient lying in the bed with no acute distress.  EYES: Pupils equal, round, reactive to light and accommodation. No scleral icterus.  Mild pallor.  Extraocular muscles intact.  HEENT: Head atraumatic, normocephalic. Oropharynx and nasopharynx clear.  NECK:  Supple, no jugular venous distention. No thyroid enlargement, no tenderness.  LUNGS: Normal breath sounds bilaterally, no wheezing, rales,rhonchi or crepitation. No use of accessory muscles of respiration.  CARDIOVASCULAR: Regular rate and rhythm, S1, S2 normal.  3/6 systolic ejection murmur at the left lower sternal border with no rubs, or gallops.  ABDOMEN: Soft, nondistended, nontender. Bowel sounds present. No organomegaly or mass.  EXTREMITIES: Trace bilateral lower extremity pitting edema, with no cyanosis, or clubbing.  NEUROLOGIC: Cranial nerves II through XII are intact. Muscle strength 5/5 in all extremities. Sensation  intact. Gait not checked.  PSYCHIATRIC: The patient is alert and oriented x 3.  Normal affect and good eye contact. SKIN: No obvious rash, lesion, or ulcer.   LABORATORY PANEL:   CBC Recent Labs  Lab 04/24/21 2000  WBC 2.1*  HGB 7.5*  HCT 22.9*  PLT 82*   ------------------------------------------------------------------------------------------------------------------  Chemistries  Recent Labs  Lab 04/24/21 2000  NA 139  K 3.7  CL 108  CO2 24  GLUCOSE 168*  BUN 15  CREATININE 1.02  CALCIUM 8.4*   ------------------------------------------------------------------------------------------------------------------  Cardiac Enzymes No results for input(s): TROPONINI in the last 168 hours. ------------------------------------------------------------------------------------------------------------------  RADIOLOGY:  No results found.    IMPRESSION AND PLAN:  Active Problems:   GI bleeding  1.  GI bleeding with subsequent acute blood loss anemia. That the patient be admitted to a medical monitored bed. - We will follow serial hemoglobins and hematocrits. - We will continue IV Protonix and octreotide drips. - The patient was typed and crossmatched and will be transfused 1 unit of packed red blood cells. - I will hold off diuretics for now and hydrate with IV normal saline. - We will continue rifaximin. - GI consultation will be obtained. - I notified Dr. Bonna Gains about the patient.  2.  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 - We will continue lactulose.  3.  Type 2 diabetes mellitus. - The patient will be placed on supplement coverage with NovoLog and will continue her basal coverage.  4.  Dyslipidemia. - We will continue statin therapy.  DVT prophylaxis: SCDs.  Medical prophylaxis currently contraindicated due to GI bleeding. Code Status: full code. Family Communication:  The plan of care was discussed in details with  the patient (and family). I answered all questions. The patient agreed to proceed with the above mentioned plan. Further management will depend upon hospital course. Disposition Plan: Back to previous home environment Consults called: Gastroenterology. All the records are reviewed and case discussed with ED provider.  Status is: Inpatient  Remains inpatient appropriate because:Ongoing diagnostic testing needed not appropriate for outpatient work up, Unsafe d/c plan, IV treatments appropriate due to intensity of illness or inability to take PO and Inpatient level of care appropriate due to severity of illness   Dispo: The patient is from: Home              Anticipated d/c is to: Home              Patient currently is not medically stable to d/c.   Difficult to place patient No  TOTAL TIME TAKING CARE OF THIS PATIENT: 55 minutes.    Christel Mormon M.D on 04/24/2021 at 11:22  PM  [facility]   From 7 PM-7 AM, contact night-coverage [email or web address]  CC: Primary care physician; Joshua Pont, MD

## 2021-04-24 NOTE — ED Triage Notes (Signed)
Pt with family in triage who reports increased weakness with SOB on exerction x3 days. Pt with hx/o liver disease and recent abdominal wall perforation.

## 2021-04-24 NOTE — ED Provider Notes (Signed)
[facility] Emergency Department Provider Note  ____________________________________________   I have reviewed the triage vital signs and the nursing notes.   HISTORY  Chief Complaint Weakness   History limited by: Not Limited   HPI Joshua Berry is a 75 y.o. male who presents to the emergency department today because of concern for weakness. The patient states that for the past three days. He says that his fatigue is generalized. The patient denies any focal weakness. Has had some shortness of breath with exertion with this weakness. The patient states that the weakness reminds him of the weakness he had a couple of months ago when he was severely anemic with a gi bleed. He says that he has not noticed any recent bloody stool or black or tarry stool. The patient has not had any abdominal pain.   Records reviewed. Per medical record review patient has a history of cirrhosis. HTN.  Past Medical History:  Diagnosis Date  . Arthritis   . Cancer (HCC)    melanoma, carcinoma  . Cirrhosis of liver ([place])   . Diabetes mellitus without complication ([place])   . Heart murmur   . Hypertension   . Iron deficiency anemia   . NASH (nonalcoholic steatohepatitis)   . Sleep apnea   . Spleen enlarged   . Thrombocytopenia [facility])     Patient Active Problem List   Diagnosis Date Noted  . Cirrhosis of liver with ascites ([place])   . Secondary esophageal varices without bleeding ([place])   . Portal hypertension ([place])   . Stomach irritation   . Melena   . Acute gastrointestinal hemorrhage 07/19/2019  . Sepsis ([place]) 12/20/2018    Past Surgical History:  Procedure Laterality Date  . CHOLECYSTECTOMY    . COLONOSCOPY N/A 07/23/2019   Procedure: COLONOSCOPY;  Surgeon: Lin Landsman, MD;  Location: [facility] ENDOSCOPY;  Service: Gastroenterology;  Laterality: N/A;  . ESOPHAGOGASTRODUODENOSCOPY N/A 07/20/2019   Procedure: ESOPHAGOGASTRODUODENOSCOPY (EGD);  Surgeon: Lin Landsman, MD;  Location: [facility] ENDOSCOPY;  Service: Gastroenterology;  Laterality: N/A;  . ESOPHAGOGASTRODUODENOSCOPY Left 08/20/2019   Procedure: ESOPHAGOGASTRODUODENOSCOPY (EGD);  Surgeon: Virgel Manifold, MD;  Location: [facility] ENDOSCOPY;  Service: Endoscopy;  Laterality: Left;  . SKIN BIOPSY    . TEE WITHOUT CARDIOVERSION N/A 12/23/2018   Procedure: TRANSESOPHAGEAL ECHOCARDIOGRAM (TEE);  Surgeon: Teodoro Spray, MD;  Location: [facility] ORS;  Service: Cardiovascular;  Laterality: N/A;    Prior to Admission medications   Medication Sig Start Date End Date Taking? Authorizing Provider  Alogliptin Benzoate (NESINA) 12.5 MG TABS Take 12.5 mg by mouth daily.    [provider]  Alpha-Lipoic Acid 100 MG CAPS Take 1 capsule by mouth daily.    [provider]  atorvastatin (LIPITOR) 10 MG tablet Take 10 mg by mouth daily.    [provider]  B Complex-C (B-COMPLEX WITH VITAMIN C) tablet Take 1 tablet by mouth daily.    [provider]  cholecalciferol (VITAMIN D) 25 MCG (1000 UT) tablet Take 1,000 Units by mouth 2 (two) times daily.     [provider]  folic acid (FOLVITE) 1 MG tablet Take 1 mg by mouth at bedtime.    [provider]  furosemide (LASIX) 40 MG tablet Take 1 tablet (40 mg total) by mouth daily. 07/24/19   Max Sane, MD  insulin aspart protamine- aspart (NOVOLOG MIX 70/30) (70-30) 100 UNIT/ML injection Inject 0.3 mLs (30 Units total) into the skin 2 (two) times daily with a  meal. 08/22/19   Gouru, Illene Silver, MD  ipratropium-albuterol (DUONEB) 0.5-2.5 (3) MG/3ML SOLN Take 3 mLs by nebulization every 4 (four) hours as needed (shortness of breath).     [provider]  levothyroxine (SYNTHROID, LEVOTHROID) 100 MCG tablet Take 100 mcg by mouth daily before breakfast.    [provider]  magnesium gluconate (MAGONATE) 500 MG tablet Take 500 mg by mouth daily.     [provider]  Melatonin 10 MG TABS Take 10 mg by mouth at  bedtime.    [provider]  Omega-3 Fatty Acids (FISH OIL) 1000 MG CAPS Take 1 capsule by mouth at bedtime.     [provider]  pantoprazole (PROTONIX) 40 MG tablet Take 1 tablet (40 mg total) by mouth 2 (two) times daily before a meal. 08/22/19   Gouru, Aruna, MD  Probiotic Product ([employer] ADV PROBIOTIC GUMMIES PO) Take 1 each by mouth daily.    [provider]  spironolactone (ALDACTONE) 100 MG tablet Take 1 tablet (100 mg total) by mouth daily. 07/24/19   Max Sane, MD  tamsulosin (FLOMAX) 0.4 MG CAPS capsule Take 0.4 mg by mouth daily.     [provider]  vitamin C (ASCORBIC ACID) 500 MG tablet Take 500 mg by mouth daily.    [provider]  Zinc Sulfate 220 (50 Zn) MG TABS Take 110 mg by mouth every evening.     [provider]    Allergies Lisinopril, Sulfamethoxazole, Camphor, Latex, and Nickel  History reviewed. No pertinent family history.  Social History Social History   Tobacco Use  . Smoking status: Former Research scientist (life sciences)  . Smokeless tobacco: Never Used  Vaping Use  . Vaping Use: Never used  Substance Use Topics  . Alcohol use: Not Currently  . Drug use: Not Currently    Review of Systems Constitutional: No fever/chills. Positive for generalized weakness.  Eyes: No visual changes. ENT: No sore throat. Cardiovascular: Denies chest pain. Respiratory: Denies shortness of breath. Gastrointestinal: No abdominal pain.  No nausea, no vomiting.  No diarrhea.   Genitourinary: Negative for dysuria. Musculoskeletal: Negative for back pain. Skin: Negative for rash. Neurological: Negative for headaches, focal weakness or numbness.  ____________________________________________   PHYSICAL EXAM:  VITAL SIGNS: ED Triage Vitals [04/24/21 1949]  Enc Vitals Group     BP (!) 119/57     Pulse Rate 79     Resp 18     Temp 98.4 F (36.9 C)     Temp Source Oral     SpO2 100 %   Constitutional: Alert and oriented.  Eyes:  Conjunctivae are normal.  ENT      Head: Normocephalic and atraumatic.      Nose: No congestion/rhinnorhea.      Mouth/Throat: Mucous membranes are moist.      Neck: No stridor. Hematological/Lymphatic/Immunilogical: No cervical lymphadenopathy. Cardiovascular: Normal rate, regular rhythm.  No murmurs, rubs, or gallops.  Respiratory: Normal respiratory effort without tachypnea nor retractions. Breath sounds are clear and equal bilaterally. No wheezes/rales/rhonchi. Gastrointestinal: Soft and non tender. No rebound. No guarding.  GUIAC: Melanotic stool on glove. Positive GUIAC Musculoskeletal: Normal range of motion in all extremities. No lower extremity edema. Neurologic:  Normal speech and language. No gross focal neurologic deficits are appreciated.  Skin:  Skin is warm, dry and intact. No rash noted. Psychiatric: Mood and affect are normal. Speech and behavior are normal. Patient exhibits appropriate insight and judgment.  ____________________________________________    LABS (pertinent positives/negatives)  Trop hs 9 BMP wnl except glu 168, ca 8.4 CBC wbc 2.1, hgb 7.5, plt 82  ____________________________________________   EKG  I, Nance Pear, attending physician, personally viewed and interpreted this EKG  EKG Time: 2001 Rate: 74 Rhythm: sinus rhythm Axis: left axis deviation Intervals: qtc 494 QRS: narrow ST changes: no st elevation Impression: abnormal ekg ____________________________________________    RADIOLOGY  None  ____________________________________________   PROCEDURES  Procedures  ____________________________________________   INITIAL IMPRESSION / ASSESSMENT AND PLAN / ED COURSE  Pertinent labs & imaging results that were available during my care of the patient were reviewed by me and considered in my medical decision making (see chart for details).   Patient presents to the emergency department today because of concern for generalized  weakness. Blood work does show anemia. GUIAC was positive. Patient has history of GI bleed and cirrhosis with varices. Given GI bleed, anemia and weakness will plan on admission. Will start IV protonix, octreotide and antibiotics because of concern for possible variceal bleed, although I do have low suspicion for true variceal bleed given presentation. Discussed plan with patient.   ____________________________________________   FINAL CLINICAL IMPRESSION(S) / ED DIAGNOSES  Final diagnoses:  Weakness  Gastrointestinal hemorrhage, unspecified gastrointestinal hemorrhage type  Anemia, unspecified type     Note: This dictation was prepared with Dragon dictation. Any transcriptional errors that result from this process are unintentional     Nance Pear, MD 04/24/21 2259

## 2021-04-25 ENCOUNTER — Other Ambulatory Visit: Payer: Self-pay

## 2021-04-25 ENCOUNTER — Inpatient Hospital Stay: Payer: No Typology Code available for payment source | Admitting: Certified Registered Nurse Anesthetist

## 2021-04-25 ENCOUNTER — Encounter: Payer: Self-pay | Admitting: Family Medicine

## 2021-04-25 ENCOUNTER — Encounter
Admission: EM | Disposition: A | Discharge status: Home / Self Care | Payer: Self-pay | Source: Home / Self Care | Attending: Family Medicine

## 2021-04-25 DIAGNOSIS — D500 Iron deficiency anemia secondary to blood loss (chronic): Secondary | ICD-10-CM | POA: Diagnosis not present

## 2021-04-25 DIAGNOSIS — K3189 Other diseases of stomach and duodenum: Secondary | ICD-10-CM | POA: Diagnosis not present

## 2021-04-25 DIAGNOSIS — K766 Portal hypertension: Secondary | ICD-10-CM

## 2021-04-25 DIAGNOSIS — R531 Weakness: Secondary | ICD-10-CM

## 2021-04-25 HISTORY — PX: ESOPHAGOGASTRODUODENOSCOPY (EGD) WITH PROPOFOL: SHX5813

## 2021-04-25 LAB — BASIC METABOLIC PANEL
Anion gap: 8 (ref 5–15)
BUN: 15 mg/dL (ref 8–23)
CO2: 21 mmol/L — ABNORMAL LOW (ref 22–32)
Calcium: 8.1 mg/dL — ABNORMAL LOW (ref 8.9–10.3)
Chloride: 111 mmol/L (ref 98–111)
Creatinine, Ser: 0.88 mg/dL (ref 0.61–1.24)
GFR, Estimated: >60 mL/min (ref >60)
Glucose, Bld: 94 mg/dL (ref 70–99)
Potassium: 3.9 mmol/L (ref 3.5–5.1)
Sodium: 140 mmol/L (ref 135–145)

## 2021-04-25 LAB — CBC
HCT: 20.8 % — ABNORMAL LOW (ref 39.0–52.0)
Hemoglobin: 6.7 g/dL — ABNORMAL LOW (ref 13.0–17.0)
MCH: 30.7 pg (ref 26.0–34.0)
MCHC: 32.2 g/dL (ref 30.0–36.0)
MCV: 95.4 fL (ref 80.0–100.0)
Platelets: 74 10*3/uL — ABNORMAL LOW (ref 150–400)
RBC: 2.18 MIL/uL — ABNORMAL LOW (ref 4.22–5.81)
RDW: 20.5 % — ABNORMAL HIGH (ref 11.5–15.5)
WBC: 2 10*3/uL — ABNORMAL LOW (ref 4.0–10.5)
nRBC: 0 % (ref 0.0–0.2)

## 2021-04-25 LAB — GLUCOSE, CAPILLARY
Glucose-Capillary: 128 mg/dL — ABNORMAL HIGH (ref 70–99)
Glucose-Capillary: 140 mg/dL — ABNORMAL HIGH (ref 70–99)
Glucose-Capillary: 159 mg/dL — ABNORMAL HIGH (ref 70–99)
Glucose-Capillary: 153 mg/dL — ABNORMAL HIGH (ref 70–99)

## 2021-04-25 LAB — PREPARE RBC (CROSSMATCH)

## 2021-04-25 LAB — HEPATIC FUNCTION PANEL
ALT: 38 U/L (ref 0–44)
AST: 46 U/L — ABNORMAL HIGH (ref 15–41)
Albumin: 2.7 g/dL — ABNORMAL LOW (ref 3.5–5.0)
Alkaline Phosphatase: 75 U/L (ref 38–126)
Bilirubin, Direct: 0.2 mg/dL (ref 0.0–0.2)
Indirect Bilirubin: 0.7 mg/dL (ref 0.3–0.9)
Total Bilirubin: 0.9 mg/dL (ref 0.3–1.2)
Total Protein: 5.3 g/dL — ABNORMAL LOW (ref 6.5–8.1)

## 2021-04-25 LAB — PROTIME-INR
INR: 1.3 — ABNORMAL HIGH (ref 0.8–1.2)
Prothrombin Time: 16 seconds — ABNORMAL HIGH (ref 11.4–15.2)

## 2021-04-25 LAB — RESP PANEL BY RT-PCR (FLU A&B, COVID) ARPGX2
Influenza A by PCR: NEGATIVE
Influenza B by PCR: NEGATIVE
SARS Coronavirus 2 by RT PCR: NEGATIVE

## 2021-04-25 LAB — FOLATE: Folate: 24 ng/mL (ref >5.9)

## 2021-04-25 LAB — VITAMIN B12: Vitamin B-12: 584 pg/mL (ref 180–914)

## 2021-04-25 LAB — IRON AND TIBC
Iron: 24 ug/dL — ABNORMAL LOW (ref 45–182)
Saturation Ratios: 8 % — ABNORMAL LOW (ref 17.9–39.5)
TIBC: 316 ug/dL (ref 250–450)
UIBC: 292 ug/dL

## 2021-04-25 LAB — HEMOGLOBIN A1C
Hgb A1c MFr Bld: 4.8 % (ref 4.8–5.6)
Mean Plasma Glucose: 91.06 mg/dL

## 2021-04-25 LAB — FERRITIN: Ferritin: 50 ng/mL (ref 24–336)

## 2021-04-25 SURGERY — ESOPHAGOGASTRODUODENOSCOPY (EGD) WITH PROPOFOL
Anesthesia: General

## 2021-04-25 MED ORDER — PROPOFOL 10 MG/ML IV BOLUS
INTRAVENOUS | Status: DC | PRN
  Administered 2021-04-25: 70 mg via INTRAVENOUS

## 2021-04-25 MED ORDER — SODIUM CHLORIDE 0.9% IV SOLUTION: Freq: Once | INTRAVENOUS | Status: AC

## 2021-04-25 MED ORDER — PROPOFOL 500 MG/50ML IV EMUL
INTRAVENOUS | Status: DC | PRN
  Administered 2021-04-25: 175 ug/kg/min via INTRAVENOUS

## 2021-04-25 MED ORDER — LIDOCAINE HCL (CARDIAC) PF 100 MG/5ML IV SOSY
PREFILLED_SYRINGE | INTRAVENOUS | Status: DC | PRN
  Administered 2021-04-25: 40 mg via INTRAVENOUS

## 2021-04-25 MED ORDER — SODIUM CHLORIDE 0.9 % IV SOLN
INTRAVENOUS | Status: DC
  Administered 2021-04-25: 1000 mL via INTRAVENOUS

## 2021-04-25 MED ORDER — FUROSEMIDE 10 MG/ML IJ SOLN: 20.0000 mg | Freq: Once | INTRAMUSCULAR | Status: DC

## 2021-04-25 MED ORDER — INSULIN GLARGINE 100 UNIT/ML ~~LOC~~ SOLN
12.0000 [IU] | Freq: Every day | SUBCUTANEOUS | Status: DC
  Administered 2021-04-25 – 2021-04-26 (×2): 12 [IU] via SUBCUTANEOUS
  Filled 2021-04-25 (×2): qty 0.12

## 2021-04-25 MED ORDER — SODIUM CHLORIDE 0.9 % IV SOLN
1.0000 g | INTRAVENOUS | Status: DC
  Administered 2021-04-25: 21:00:00 1 g via INTRAVENOUS
  Filled 2021-04-25: qty 1
  Filled 2021-04-25: qty 10

## 2021-04-25 MED ORDER — ACETAMINOPHEN 325 MG PO TABS
650.0000 mg | ORAL_TABLET | Freq: Once | ORAL | Status: AC
  Administered 2021-04-25: 11:00:00 650 mg via ORAL
  Filled 2021-04-25: qty 2

## 2021-04-25 MED ORDER — DIPHENHYDRAMINE HCL 25 MG PO CAPS
25.0000 mg | ORAL_CAPSULE | Freq: Once | ORAL | Status: AC
  Administered 2021-04-25: 25 mg via ORAL
  Filled 2021-04-25: qty 1

## 2021-04-25 NOTE — Progress Notes (Signed)
Pt came back to room after EGD , he is alert but lethargic, vitals sign stable

## 2021-04-25 NOTE — Anesthesia Postprocedure Evaluation (Signed)
Anesthesia Post Note  [name]  Procedure(s) Performed: ESOPHAGOGASTRODUODENOSCOPY (EGD) WITH PROPOFOL (N/A )  Patient location during evaluation: Phase II Anesthesia Type: General Level of consciousness: awake and alert, awake and oriented Pain management: pain level controlled Vital Signs Assessment: post-procedure vital signs reviewed and stable Respiratory status: spontaneous breathing, nonlabored ventilation and respiratory function stable Cardiovascular status: blood pressure returned to baseline and stable Postop Assessment: no apparent nausea or vomiting Anesthetic complications: no   No complications documented.   Last Vitals:  Vitals:   04/25/21 1306 04/25/21 1310  BP: (!) 117/40 (!) 125/59  Pulse: 63   Resp: 10   Temp:    SpO2: 99%     Last Pain:  Vitals:   04/25/21 1320  TempSrc:   PainSc: 0-No pain                 Phill Mutter

## 2021-04-25 NOTE — Hospital Course (Addendum)
Hemoglobin 6.7--2 units PRBC-->8.2 White count 2.2 ANC 1.2 Platelet 83 Bicarb 20 BUNs/creatinine 16/0.9 AST/ALT 45/36 Total bili 1.7  CBG range 1 21-1 59  Impression:            - Normal duodenal bulb and second portion of the                         duodenum.                        - Portal hypertensive gastropathy. Treated with                         bipolar cautery.                        - Normal gastroesophageal junction and esophagus.                        - No specimens collected. Recommendation:        - Return patient to hospital ward for ongoing care.                        - Low sodium diet today.                        - Continue present medications.                        - Protonix 14m BID                        - D/C protoix drip                        - Return to GI clinic as previously scheduled

## 2021-04-25 NOTE — Anesthesia Preprocedure Evaluation (Signed)
Anesthesia Evaluation  Patient identified by MRN, date of birth, ID band Patient awake    Reviewed: Allergy & Precautions, H&P , NPO status , Patient's Chart, lab work & pertinent test results  Airway Mallampati: III  TM Distance: >3 FB Neck ROM: Full    Dental  (+) Loose, Chipped, Poor Dentition, Missing   Pulmonary sleep apnea and Continuous Positive Airway Pressure Ventilation , neg COPD, neg recent URI, former smoker,    Pulmonary exam normal        Cardiovascular hypertension, Pt. on medications (-) angina(-) Past MI (-) dysrhythmias + Valvular Problems/Murmurs AS  + Systolic murmurs    Neuro/Psych negative neurological ROS  negative psych ROS   GI/Hepatic negative GI ROS, (+) Cirrhosis  ([place])      , Hepatitis -, Toxin Related  Endo/Other  diabetes, Well Controlled, Insulin Dependent  Renal/GU negative Renal ROS  negative genitourinary   Musculoskeletal  (+) Arthritis , Osteoarthritis,    Abdominal   Peds  Hematology  (+) Blood dyscrasia, anemia ,   Anesthesia Other Findings Past Medical History: No date: Arthritis No date: Cancer (HCC)     Comment:  melanoma, carcinoma No date: Cirrhosis of liver (HCC) No date: Diabetes mellitus without complication (HCC) No date: Heart murmur No date: Hypertension No date: Iron deficiency anemia No date: NASH (nonalcoholic steatohepatitis) No date: Sleep apnea No date: Spleen enlarged No date: Thrombocytopenia ([place])  Past Surgical History: No date: CHOLECYSTECTOMY 07/23/2019: COLONOSCOPY; N/A     Comment:  Procedure: COLONOSCOPY;  Surgeon: Lin Landsman,               MD;  Location: [facility] ENDOSCOPY;  Service:               Gastroenterology;  Laterality: N/A; 07/20/2019: ESOPHAGOGASTRODUODENOSCOPY; N/A     Comment:  Procedure: ESOPHAGOGASTRODUODENOSCOPY (EGD);  Surgeon:               Lin Landsman, MD;  Location: [facility] ENDOSCOPY;                Service:  Gastroenterology;  Laterality: N/A; No date: SKIN BIOPSY 12/23/2018: TEE WITHOUT CARDIOVERSION; N/A     Comment:  Procedure: TRANSESOPHAGEAL ECHOCARDIOGRAM (TEE);                Surgeon: Teodoro Spray, MD;  Location: [facility] ORS;                Service: Cardiovascular;  Laterality: N/A;  BMI    Body Mass Index: 28.87 kg/m      Reproductive/Obstetrics negative OB ROS                             Anesthesia Physical  Anesthesia Plan  ASA: III  Anesthesia Plan: General   Post-op Pain Management:    Induction:   PONV Risk Score and Plan: Propofol infusion and TIVA  Airway Management Planned: Natural Airway and Nasal Cannula  Additional Equipment:   Intra-op Plan:   Post-operative Plan:   Informed Consent: I have reviewed the patients History and Physical, chart, labs and discussed the procedure including the risks, benefits and alternatives for the proposed anesthesia with the patient or authorized representative who has indicated his/her understanding and acceptance.     Dental Advisory Given  Plan Discussed with: Anesthesiologist and CRNA  Anesthesia Plan Comments:         Anesthesia Quick Evaluation

## 2021-04-25 NOTE — Op Note (Signed)
[facility] Gastroenterology Patient Name: Joshua Berry Procedure Date: 04/25/2021 11:15 AM MRN: [id number] Account #: [id number] Date of Birth: 06/14/1946 Admit Type: Outpatient Age: 75 Room: [facility] ENDO ROOM 4 Gender: Male Note Status: Finalized Procedure:             Upper GI endoscopy Indications:           Iron deficiency anemia secondary to chronic blood                         loss, Iron deficiency anemia due to suspected upper                         gastrointestinal bleeding Providers:             Lin Landsman MD, MD Medicines:             General Anesthesia Complications:         No immediate complications. Estimated blood loss: None. Procedure:             Pre-Anesthesia Assessment:                        - Prior to the procedure, a History and Physical was                         performed, and patient medications and allergies were                         reviewed. The patient is competent. The risks and                         benefits of the procedure and the sedation options and                         risks were discussed with the patient. All questions                         were answered and informed consent was obtained.                         Patient identification and proposed procedure were                         verified by the physician, the nurse, the                         anesthesiologist, the anesthetist and the technician                         in the pre-procedure area in the procedure room in the                         endoscopy suite. Mental Status Examination: alert and                         oriented. Airway Examination: normal oropharyngeal                         airway and neck mobility. Respiratory  Examination:                         clear to auscultation. CV Examination: normal.                         Prophylactic Antibiotics: The patient does not require                         prophylactic antibiotics. Prior  Anticoagulants: The                         patient has taken no previous anticoagulant or                         antiplatelet agents. ASA Grade Assessment: III - A                         patient with severe systemic disease. After reviewing                         the risks and benefits, the patient was deemed in                         satisfactory condition to undergo the procedure. The                         anesthesia plan was to use general anesthesia.                         Immediately prior to administration of medications,                         the patient was re-assessed for adequacy to receive                         sedatives. The heart rate, respiratory rate, oxygen                         saturations, blood pressure, adequacy of pulmonary                         ventilation, and response to care were monitored                         throughout the procedure. The physical status of the                         patient was re-assessed after the procedure.                        After obtaining informed consent, the endoscope was                         passed under direct vision. Throughout the procedure,                         the patient's blood pressure, pulse, and oxygen  saturations were monitored continuously. The Endoscope                         was introduced through the mouth, and advanced to the                         second part of duodenum. The upper GI endoscopy was                         accomplished without difficulty. The patient tolerated                         the procedure well. Findings:      The duodenal bulb and second portion of the duodenum were normal.      Severe, diffuse portal hypertensive gastropathy was found in the gastric       antrum. Coagulation for hemostasis using bipolar probe was successful.       Estimated blood loss: none.      The cardia and gastric fundus were normal on retroflexion.      The  gastroesophageal junction and examined esophagus were normal. Impression:            - Normal duodenal bulb and second portion of the                         duodenum.                        - Portal hypertensive gastropathy. Treated with                         bipolar cautery.                        - Normal gastroesophageal junction and esophagus.                        - No specimens collected. Recommendation:        - Return patient to hospital ward for ongoing care.                        - Low sodium diet today.                        - Continue present medications.                        - Protonix 29m BID                        - D/C protoix drip                        - [place] Code(s):     --- Professional ---                        [phone] Esophagogastroduodenoscopy, flexible,                         transoral; with control of bleeding, any method Diagnosis Code(s):     ---  Professional ---                        K76.6, Portal hypertension                        K31.89, Other diseases of stomach and duodenum                        D50.0, Iron deficiency anemia secondary to blood loss                         (chronic)                        D50.9, Iron deficiency anemia, unspecified CPT copyright 2019 [employer] Medical Association. All rights reserved. The codes documented in this report are preliminary and upon coder review may  be revised to meet current compliance requirements. Dr. Ulyess Mort Lin Landsman MD, MD 04/25/2021 1:00:28 PM This report has been signed electronically. Number of Addenda: 0 Note Initiated On: 04/25/2021 11:15 AM Estimated Blood Loss:  Estimated blood loss: none.      [facility]

## 2021-04-25 NOTE — Progress Notes (Signed)
PROGRESS NOTE   Joshua Berry  JQZ:[id number] DOB: 1946-10-03 DOA: 04/24/2021 PCP: Lorelei Pont, MD  Brief Narrative:  10 white male home dwelling known history NASH cirrhosis + thrombocytopenia and splenomegaly Patient is status post TIPS procedure [facility] U 12/21--has had issues with hepatic encephalopathy in the past in addition HTN DM TY 2 MelanomaProblem Iron deficiency anemia OSA/CPAP Aortic stenosis   Previously has been seen by Dr. 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 and 3 columns of nonbleeding large greater than 5 mm varices in mid esophagus-colonoscopy at the time showed sessile polyps 5 mm size  Patient tells me he went to the [place] office about 5 to 6 weeks ago and was found to have a hemoglobin in the 5 range he was promptly transferred to [facility] where apparently cauterization was done of the abdominal wall Review of chart reveals that 03/07/2021 he had G8AVE he received a transfusion and was stable and his Aldactone Lasix were held at the last hospital at home program 03/14/2021  Comes back to [facility]: Lambert Rhonda W from home with generalized weakness X 3 days-dark brown stool denies BRB per rectum  Hospital-Problem based course Recurrent GI bleed Gastroenterology aware-appreciate their input in advance Continue saline 100 cc/h, octreotide gtt., Protonix gtt. 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   Difficult to place patient No       Consultants:   Gastroenterology  Procedures: None yet  Antimicrobials: Continuing ceftriaxone Xifaxan   Subjective: Awake alert coherent no distress No reports of dark stool tarry stool N.p.o. for procedure No abdominal pain no fever no chills  Objective: Vitals:   04/25/21 0113 04/25/21 0336 04/25/21 0500 04/25/21 0825  BP: 121/67 (!) 110/58  120/61  Pulse: (!) 58 62  62  Resp: 16 14  17   Temp: 97.8 F (36.6 C) 97.8 F (36.6 C)  98.3 F (36.8 C)  TempSrc: Oral Oral  Oral  SpO2: 100% 98%  98%  Weight: 105.3 kg     Height: 6' 2"  (1.88 m)  6' (1.829 m)     Intake/Output Summary (Last 24 hours) at 04/25/2021 0942 Last data filed at 04/25/2021 0355 Gross per 24 hour  Intake 452.05 ml  Output --  Net 452.05 ml   Filed Weights   04/25/21 0113  Weight: 105.3 kg    Examination: EOMI NCAT thick neck Mallampati 4 looks about stated age S1-S2  no murmur Abdomen obese nontender no rebound cannot appreciate hepatomegaly No lower extremity edema power 5/5 Patient coherent alert intact GCS 15    Data Reviewed: personally reviewed   Hemoglobin 7.5->6.7 WBC 2.1->2.0 Platelet 82-->74 COVID-negative flu negative No LFTs performed BUN/creatinine 15/1.0-->15/0.88 Bicarb 21  Radiology Studies: No results found.   Scheduled Meds: . sodium chloride   Intravenous Once  . acetaminophen  650 mg Oral Once  . acidophilus  1 capsule Oral  Daily  . vitamin C  500 mg Oral Daily  . atorvastatin  20 mg Oral Daily  . cholecalciferol  1,000 Units Oral BID  . diphenhydrAMINE  25 mg Oral Once  . ferrous sulfate  325 mg Oral Q breakfast  . folic acid  1 mg Oral QHS  . furosemide  20 mg Intravenous Once  . insulin aspart  0-9 Units Subcutaneous TID WC  . insulin glargine  32 Units Subcutaneous Daily  . ketotifen  1 drop Both Eyes BID  . lactulose  10 g Oral TID  . levothyroxine  100 mcg Oral Q0600  . multivitamin with minerals  1 tablet Oral Daily  . [START ON 04/28/2021] pantoprazole  40 mg Intravenous Q12H  . polyvinyl alcohol  1 drop Both Eyes QID  . rifaximin  550 mg Oral BID  . terazosin  5 mg Oral QHS  . thiamine  100 mg Oral Daily   Continuous Infusions: . sodium chloride 100 mL/hr at 04/25/21 0355  . octreotide  (SANDOSTATIN)    IV infusion 50 mcg/hr (04/25/21 0355)  . pantoprozole (PROTONIX) infusion 8 mg/hr (04/25/21 0355)     LOS: 1 day   Time spent: 79 minutes  Nita Sells, MD [facility] To contact the attending provider between 7A-7P or the covering provider during after hours 7P-7A, please log into the web site [email or web address] and access using universal [place] password for that web site. If you do not have the password, please call the hospital operator.  04/25/2021, 9:42 AM

## 2021-04-25 NOTE — Consult Note (Signed)
Joshua Darby, MD [address]  [place]  [place], [place] [address]  Main: [phone]  Fax: [phone] Pager: [phone]   Consultation  Referring Provider:     No ref. provider found Primary Care Physician:  Lorelei Pont, MD Primary Gastroenterologist: [facility]         Reason for Consultation:     Symptomatic anemia  Date of Admission:  04/24/2021 Date of Consultation:  04/25/2021         HPI:   Joshua Berry is a 75 y.o. male with known history of [name] cirrhosis, decompensated with ascites is admitted with symptomatic anemia.  Patient presented with generalized weakness and exertional dyspnea, progressively worse for last 3 days.  He is hemodynamically stable, his hemoglobin on presentation was 7.5, decreased to 6.7 today.  Patient is currently receiving first unit of PRBCs.  Platelets 74 which are chronically low secondary to cirrhosis.  Patient reports that he underwent upper endoscopy about 4 to 5 weeks ago at the [place] secondary to hemoglobin of 5.5.  He he said, had to cauterize his stomach in order to control bleeding.  He did not report any variceal ligation.  He was told that his esophagus was normal.  Patient underwent upper endoscopy at [facility] during prior hospitalizations for upper GI bleed.  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 Patient is n.p.o., on pantoprazole drip, octreotide drip, received ceftriaxone for SBP prophylaxis.  He denies any swelling of legs or abdominal distention. Has iron deficiency anemia secondary to chronic blood loss.  Patient takes oral iron as outpatient  NSAIDs: None  Antiplts/Anticoagulants/Anti thrombotics: None  GI Procedures:  EGD 08/20/2019 - Non-bleeding grade II esophageal varices. - Portal hypertensive gastropathy. - Friable (with spontaneous bleeding) and petechial mucosa in the antrum. Treated with argon plasma  coagulation (APC). - Normal examined duodenum. - No specimens collected.  Colonoscopy 07/23/2019 - Hemorrhoids found on perianal exam. - The examined portion of the ileum was normal. - Three 5 mm polyps in the transverse colon and in the ascending colon, removed with a cold snare. Resected and retrieved. - Diverticulosis in the sigmoid colon, in the descending colon and in the transverse colon. - Non-bleeding external and internal hemorrhoids.   Past Medical History:  Diagnosis Date  . Arthritis   . Cancer (HCC)    melanoma, carcinoma  . Cirrhosis of liver ([place])   . Diabetes mellitus without complication ([place])   . Heart murmur   . Hypertension   . Iron deficiency anemia   . NASH (nonalcoholic steatohepatitis)   . Sleep apnea   . Spleen enlarged   . Thrombocytopenia ([place])     Past Surgical History:  Procedure Laterality Date  . CHOLECYSTECTOMY    . COLONOSCOPY N/A 07/23/2019   Procedure: COLONOSCOPY;  Surgeon: Lin Landsman, MD;  Location: [facility] ENDOSCOPY;  Service: Gastroenterology;  Laterality: N/A;  . ESOPHAGOGASTRODUODENOSCOPY N/A 07/20/2019   Procedure: ESOPHAGOGASTRODUODENOSCOPY (EGD);  Surgeon: Lin Landsman, MD;  Location: [facility] ENDOSCOPY;  Service: Gastroenterology;  Laterality: N/A;  . ESOPHAGOGASTRODUODENOSCOPY Left 08/20/2019   Procedure: ESOPHAGOGASTRODUODENOSCOPY (EGD);  Surgeon: Virgel Manifold, MD;  Location: [facility] ENDOSCOPY;  Service: Endoscopy;  Laterality: Left;  . SKIN BIOPSY    . TEE WITHOUT CARDIOVERSION N/A 12/23/2018   Procedure: TRANSESOPHAGEAL ECHOCARDIOGRAM (TEE);  Surgeon: Teodoro Spray, MD;  Location: [facility] ORS;  Service: Cardiovascular;  Laterality: N/A;  Prior to Admission medications   Medication Sig Start Date End Date Taking? Authorizing Provider  albuterol (VENTOLIN HFA) 108 (90 Base) MCG/ACT inhaler Inhale 1-2 puffs into the lungs every 6 (six) hours as needed for wheezing or shortness of breath.   Yes [provider]   atorvastatin (LIPITOR) 10 MG tablet Take 20 mg by mouth daily.   Yes [provider]  augmented betamethasone dipropionate (DIPROLENE-AF) 0.05 % cream Apply 1 application topically 2 (two) times daily.   Yes [provider]  carboxymethylcellulose (REFRESH PLUS) 0.5 % SOLN 1 drop at bedtime.   Yes [provider]  cholecalciferol (VITAMIN D) 25 MCG (1000 UT) tablet Take 1,000 Units by mouth 2 (two) times daily.    Yes [provider]  ferrous sulfate 325 (65 FE) MG tablet Take 325 mg by mouth daily with breakfast.   Yes [provider]  folic acid (FOLVITE) 1 MG tablet Take 1 mg by mouth at bedtime.   Yes [provider]  furosemide (LASIX) 40 MG tablet Take 1 tablet (40 mg total) by mouth daily. 07/24/19  Yes Max Sane, MD  Glycerin-Hypromellose-PEG 400 0.2-0.2-1 % SOLN Apply 1 drop to eye 4 (four) times daily.   Yes [provider]  insulin aspart (NOVOLOG) 100 UNIT/ML injection Inject 26 Units into the skin 3 (three) times daily before meals.   Yes [provider]  insulin glargine (LANTUS) 100 UNIT/ML injection Inject 32 Units into the skin daily.   Yes [provider]  ipratropium-albuterol (DUONEB) 0.5-2.5 (3) MG/3ML SOLN Take 3 mLs by nebulization every 4 (four) hours as needed (shortness of breath).    Yes [provider]  ketotifen (ZADITOR) 0.025 % ophthalmic solution Place 1 drop into both eyes 2 (two) times daily.   Yes [provider]  lactulose (CHRONULAC) 10 GM/15ML solution Take 10 g by mouth 3 (three) times daily.   Yes [provider]  levothyroxine (SYNTHROID, LEVOTHROID) 100 MCG tablet Take 100 mcg by mouth daily before breakfast.   Yes [provider]  MAGNESIUM GLUCONATE PO Take 420 mg by mouth daily.   Yes [provider]  omeprazole (PRILOSEC) 20 MG capsule Take 20 mg by mouth daily.   Yes [provider]  Probiotic Product ([employer] ADV PROBIOTIC  GUMMIES PO) Take 1 each by mouth daily.   Yes [provider]  rifaximin (XIFAXAN) 550 MG TABS tablet Take 550 mg by mouth 2 (two) times daily.   Yes [provider]  Semaglutide,0.25 or 0.5MG/DOS, 2 MG/1.5ML SOPN Inject 0.5 mg into the skin once a week.   Yes [provider]  terazosin (HYTRIN) 5 MG capsule Take 5 mg by mouth at bedtime.   Yes [provider]  vitamin C (ASCORBIC ACID) 500 MG tablet Take 500 mg by mouth daily.   Yes [provider]   Current Facility-Administered Medications:  .  0.9 %  sodium chloride infusion, , Intravenous, Continuous, Mansy, Arvella Merles, MD, Paused at 04/25/21 1038 .  acidophilus (RISAQUAD) capsule 1 capsule, 1 capsule, Oral, Daily, Mansy, Jan A, MD, 1 capsule at 04/25/21 0929 .  albuterol (PROVENTIL) (2.5 MG/3ML) 0.083% nebulizer solution 2.5 mg, 2.5 mg, Inhalation, Q6H PRN, Mansy, Jan A, MD .  ascorbic acid (VITAMIN C) tablet 500 mg, 500 mg, Oral, Daily, Mansy, Jan A, MD, 500 mg at 04/25/21 0929 .  atorvastatin (LIPITOR) tablet 20 mg, 20 mg, Oral, Daily, Mansy, Jan A, MD, 20 mg at 04/25/21 0929 .  cefTRIAXone (ROCEPHIN) 1 g in sodium chloride 0.9 % 100 mL IVPB, 1 g, Intravenous, Q24H, Samtani, Jai-Gurmukh, MD .  cholecalciferol (VITAMIN D) tablet 1,000 Units, 1,000 Units, Oral, BID, Mansy, Arvella Merles, MD, 1,000 Units at 04/25/21 0929 .  ferrous sulfate tablet 325 mg, 325 mg, Oral, Q breakfast, Mansy, Jan A, MD, 325 mg at 96/22/29 7989 .  folic acid (FOLVITE) tablet 1 mg, 1 mg, Oral, QHS, Mansy, Jan A, MD .  insulin aspart (novoLOG) injection 0-9 Units, 0-9 Units, Subcutaneous, TID WC, Mansy, Arvella Merles, MD, 1 Units at 04/25/21 0934 .  insulin glargine (LANTUS) injection 12 Units, 12 Units, Subcutaneous, Daily, Samtani, Jai-Gurmukh, MD .  ipratropium-albuterol (DUONEB) 0.5-2.5 (3) MG/3ML nebulizer solution 3 mL, 3 mL, Nebulization, Q4H PRN, Mansy, Jan A, MD .  ketotifen (ZADITOR) 0.025 % ophthalmic solution 1 drop, 1 drop, Both  Eyes, BID, Mansy, Jan A, MD .  lactulose (CHRONULAC) 10 GM/15ML solution 10 g, 10 g, Oral, TID, Mansy, Jan A, MD, 10 g at 04/25/21 0937 .  levothyroxine (SYNTHROID) tablet 100 mcg, 100 mcg, Oral, Q0600, Mansy, Jan A, MD .  magnesium hydroxide (MILK OF MAGNESIA) suspension 30 mL, 30 mL, Oral, Daily PRN, Mansy, Jan A, MD .  multivitamin with minerals tablet 1 tablet, 1 tablet, Oral, Daily, Mansy, Jan A, MD, 1 tablet at 04/25/21 0929 .  octreotide (SANDOSTATIN) 500 mcg in sodium chloride 0.9 % 250 mL (2 mcg/mL) infusion, 50 mcg/hr, Intravenous, Continuous, Nance Pear, MD, Last Rate: 25 mL/hr at 04/25/21 1001, 50 mcg/hr at 04/25/21 1001 .  ondansetron (ZOFRAN) tablet 4 mg, 4 mg, Oral, Q6H PRN **OR** ondansetron (ZOFRAN) injection 4 mg, 4 mg, Intravenous, Q6H PRN, Mansy, Jan A, MD .  pantoprazole (PROTONIX) 80 mg in sodium chloride 0.9 % 100 mL (0.8 mg/mL) infusion, 8 mg/hr, Intravenous, Continuous, Nance Pear, MD, Last Rate: 10 mL/hr at 04/25/21 0355, 8 mg/hr at 04/25/21 0355 .  [START ON 04/28/2021] pantoprazole (PROTONIX) injection 40 mg, 40 mg, Intravenous, Q12H, Nance Pear, MD .  polyvinyl alcohol (LIQUIFILM TEARS) 1.4 % ophthalmic solution 1 drop, 1 drop, Both Eyes, QID, Mansy, Jan A, MD, 1 drop at 04/25/21 0939 .  rifaximin (XIFAXAN) tablet 550 mg, 550 mg, Oral, BID, Mansy, Jan A, MD, 550 mg at 04/25/21 0928 .  terazosin (HYTRIN) capsule 5 mg, 5 mg, Oral, QHS, Mansy, Jan A, MD .  thiamine tablet 100 mg, 100 mg, Oral, Daily, Mansy, Jan A, MD, 100 mg at 04/25/21 2119   History reviewed. No pertinent family history.   Social History   Tobacco Use  . Smoking status: Former Research scientist (life sciences)  . Smokeless tobacco: Never Used  Vaping Use  . Vaping Use: Never used  Substance Use Topics  . Alcohol use: Not Currently  . Drug use: Not Currently    Allergies as of 04/24/2021 - Review Complete 04/24/2021  Allergen Reaction Noted  . Lisinopril Hives 09/09/2017  . Sulfamethoxazole Swelling  09/16/2012  . Camphor Rash and Swelling 11/17/2012  . Latex Rash 09/16/2012  . Nickel Rash 11/17/2012    Review of Systems:    All systems reviewed and negative except where noted in HPI.   Physical Exam:  Vital signs in last 24 hours: Temp:  [97.7 F (36.5 C)-98.4 F (36.9 C)] 98.1 F (36.7 C) (05/11 1137) Pulse Rate:  [58-79] 58 (05/11 1137) Resp:  [13-18] 18 (05/11 1137) BP: (110-141)/(57-71) 141/60 (05/11 1137) SpO2:  [97 %-100 %] 98 % (05/11 1137) Weight:  [105.3 kg] 105.3 kg (05/11  0113) Last BM Date: 04/24/21 General:   Pleasant, cooperative in NAD Head:  Normocephalic and atraumatic. Eyes:   No icterus.   Conjunctiva pale. PERRLA. Ears:  Normal auditory acuity. Neck:  Supple; no masses or thyroidomegaly Lungs: Respirations even and unlabored. Lungs clear to auscultation bilaterally.   No wheezes, crackles, or rhonchi.  Heart:  Regular rate and rhythm;  Without murmur, clicks, rubs or gallops Abdomen:  Soft, nondistended, nontender. Normal bowel sounds. No appreciable masses or hepatomegaly.  No rebound or guarding.  Rectal:  Not performed. Msk:  Symmetrical without gross deformities.  Strength generalized weakness Extremities:  Without edema, cyanosis or clubbing. Neurologic:  Alert and oriented x3;  grossly normal neurologically. Skin:  Intact without significant lesions or rashes. Psych:  Alert and cooperative. Normal affect.  LAB RESULTS: CBC Latest Ref Rng & Units 04/25/2021 04/24/2021 09/23/2019  WBC 4.0 - 10.5 K/uL 2.0(L) 2.1(L) 2.3(L)  Hemoglobin 13.0 - 17.0 g/dL 6.7(L) 7.5(L) 8.7(L)  Hematocrit 39.0 - 52.0 % 20.8(L) 22.9(L) 27.0(L)  Platelets 150 - 400 K/uL 74(L) 82(L) 74(L)    BMET BMP Latest Ref Rng & Units 04/25/2021 04/24/2021 09/23/2019  Glucose 70 - 99 mg/dL 94 168(H) 237(H)  BUN 8 - 23 mg/dL 15 15 15   Creatinine 0.61 - 1.24 mg/dL 0.88 1.02 1.17  Sodium 135 - 145 mmol/L 140 139 137  Potassium 3.5 - 5.1 mmol/L 3.9 3.7 4.3  Chloride 98 - 111 mmol/L 111  108 104  CO2 22 - 32 mmol/L 21(L) 24 26  Calcium 8.9 - 10.3 mg/dL 8.1(L) 8.4(L) 9.2    LFT Hepatic Function Latest Ref Rng & Units 04/25/2021 09/23/2019 09/02/2019  Total Protein 6.5 - 8.1 g/dL 5.3(L) 7.3 7.5  Albumin 3.5 - 5.0 g/dL 2.7(L) 3.7 3.6  AST 15 - 41 U/L 46(H) 45(H) 38  ALT 0 - 44 U/L 38 41 36  Alk Phosphatase 38 - 126 U/L 75 70 85  Total Bilirubin 0.3 - 1.2 mg/dL 0.9 0.8 0.8  Bilirubin, Direct 0.0 - 0.2 mg/dL 0.2 <0.1 -     STUDIES: No results found.    Impression / Plan:   Joshua Berry is a 74 y.o. pleasant Caucasian male with history of [name] cirrhosis, decompensated secondary to ascites, diabetes, known history of esophageal varices s/p ligation, known history of portal hypertensive gastropathy, treated with APC in the past presented with acute on chronic symptomatic iron deficiency anemia likely secondary to chronic blood loss from portal hypertensive gastropathy  Continue PPI and octreotide drips Continue ceftriaxone for SBP prophylaxis Blood transfusion to maintain hemoglobin above 7, platelets above 50 as needed Recommend EGD for further evaluation Due to refractory portal hypertensive gastropathy leading to recurrent anemia and hospitalizations and blood transfusions, endoscopic cryotherapy could be an alternative option.  Will discuss with patient after the endoscopy based on the findings today  I have discussed alternative options, risks & benefits,  which include, but are not limited to, bleeding, infection, perforation,respiratory complication & drug reaction.  The patient agrees with this plan & written consent will be obtained.     Thank you for involving me in the care of this patient.      LOS: 1 day   Sherri Sear, MD  04/25/2021, 12:09 PM   Note: This dictation was prepared with Dragon dictation along with smaller phrase technology. Any transcriptional errors that result from this process are unintentional.

## 2021-04-25 NOTE — Transfer of Care (Signed)
Immediate Anesthesia Transfer of Care Note  [name]  Procedure(s) Performed: ESOPHAGOGASTRODUODENOSCOPY (EGD) WITH PROPOFOL (N/A )  Patient Location: PACU  Anesthesia Type:General  Level of Consciousness: sedated  Airway & Oxygen Therapy: Patient Spontanous Breathing and Patient connected to nasal cannula oxygen  Post-op Assessment: Report given to RN and Post -op Vital signs reviewed and stable  Post vital signs: Reviewed and stable  Last Vitals:  Vitals Value Taken Time  BP 117/40 04/25/21 1306  Temp 36.7 C 04/25/21 1300  Pulse 63 04/25/21 1306  Resp 10 04/25/21 1306  SpO2 99 % 04/25/21 1306    Last Pain:  Vitals:   04/25/21 1300  TempSrc: Temporal  PainSc: Asleep         Complications: No complications documented.

## 2021-04-25 NOTE — Anesthesia Procedure Notes (Signed)
Date/Time: 04/25/2021 12:27 PM Performed by: Johnna Acosta, CRNA Pre-anesthesia Checklist: Patient identified, Emergency Drugs available, Suction available, Patient being monitored and Timeout performed Patient Re-evaluated:Patient Re-evaluated prior to induction Oxygen Delivery Method: Nasal cannula Preoxygenation: Pre-oxygenation with 100% oxygen

## 2021-04-26 LAB — COMPREHENSIVE METABOLIC PANEL
ALT: 36 U/L (ref 0–44)
AST: 45 U/L — ABNORMAL HIGH (ref 15–41)
Albumin: 2.8 g/dL — ABNORMAL LOW (ref 3.5–5.0)
Alkaline Phosphatase: 70 U/L (ref 38–126)
Anion gap: 7 (ref 5–15)
BUN: 16 mg/dL (ref 8–23)
CO2: 20 mmol/L — ABNORMAL LOW (ref 22–32)
Calcium: 8.1 mg/dL — ABNORMAL LOW (ref 8.9–10.3)
Chloride: 112 mmol/L — ABNORMAL HIGH (ref 98–111)
Creatinine, Ser: 0.98 mg/dL (ref 0.61–1.24)
GFR, Estimated: >60 mL/min (ref >60)
Glucose, Bld: 121 mg/dL — ABNORMAL HIGH (ref 70–99)
Potassium: 3.8 mmol/L (ref 3.5–5.1)
Sodium: 139 mmol/L (ref 135–145)
Total Bilirubin: 1.7 mg/dL — ABNORMAL HIGH (ref 0.3–1.2)
Total Protein: 5.1 g/dL — ABNORMAL LOW (ref 6.5–8.1)

## 2021-04-26 LAB — BPAM RBC
Blood Product Expiration Date: 202205132359
Blood Product Expiration Date: 202205132359
ISSUE DATE / TIME: 202205111111
ISSUE DATE / TIME: 202205111414
Unit Type and Rh: 9500
Unit Type and Rh: 9500

## 2021-04-26 LAB — CBC WITH DIFFERENTIAL/PLATELET
Abs Immature Granulocytes: 0 10*3/uL (ref 0.00–0.07)
Basophils Absolute: 0 10*3/uL (ref 0.0–0.1)
Basophils Relative: 1 %
Eosinophils Absolute: 0.1 10*3/uL (ref 0.0–0.5)
Eosinophils Relative: 3 %
HCT: 24.4 % — ABNORMAL LOW (ref 39.0–52.0)
Hemoglobin: 8.2 g/dL — ABNORMAL LOW (ref 13.0–17.0)
Immature Granulocytes: 0 %
Lymphocytes Relative: 31 %
Lymphs Abs: 0.7 10*3/uL (ref 0.7–4.0)
MCH: 30.6 pg (ref 26.0–34.0)
MCHC: 33.6 g/dL (ref 30.0–36.0)
MCV: 91 fL (ref 80.0–100.0)
Monocytes Absolute: 0.3 10*3/uL (ref 0.1–1.0)
Monocytes Relative: 12 %
Neutro Abs: 1.2 10*3/uL — ABNORMAL LOW (ref 1.7–7.7)
Neutrophils Relative %: 53 %
Platelets: 83 10*3/uL — ABNORMAL LOW (ref 150–400)
RBC: 2.68 MIL/uL — ABNORMAL LOW (ref 4.22–5.81)
RDW: 22.1 % — ABNORMAL HIGH (ref 11.5–15.5)
Smear Review: NORMAL
WBC: 2.2 10*3/uL — ABNORMAL LOW (ref 4.0–10.5)
nRBC: 0 % (ref 0.0–0.2)

## 2021-04-26 LAB — TYPE AND SCREEN
ABO/RH(D): O NEG
Antibody Screen: NEGATIVE
Unit division: 0
Unit division: 0

## 2021-04-26 LAB — GLUCOSE, CAPILLARY
Glucose-Capillary: 122 mg/dL — ABNORMAL HIGH (ref 70–99)
Glucose-Capillary: 154 mg/dL — ABNORMAL HIGH (ref 70–99)

## 2021-04-26 MED ORDER — INSULIN ASPART 100 UNIT/ML IJ SOLN
10.0000 [IU] | Freq: Three times a day (TID) | INTRAMUSCULAR | 11 refills | Status: DC
Start: 2021-04-26 — End: 2023-08-19

## 2021-04-26 MED ORDER — PANTOPRAZOLE SODIUM 40 MG PO TBEC: 40.0000 mg | DELAYED_RELEASE_TABLET | Freq: Two times a day (BID) | ORAL | 3 refills | Status: DC

## 2021-04-26 MED ORDER — OXYCODONE HCL 5 MG PO TABS
5.0000 mg | ORAL_TABLET | Freq: Once | ORAL | Status: AC
Start: 2021-04-26 — End: 2021-04-26
  Administered 2021-04-26: 04:00:00 5 mg via ORAL
  Filled 2021-04-26 (×2): qty 1

## 2021-04-26 MED ORDER — INSULIN GLARGINE 100 UNIT/ML ~~LOC~~ SOLN: 16.0000 [IU] | Freq: Every day | SUBCUTANEOUS | 11 refills | Status: DC

## 2021-04-26 MED ORDER — PANTOPRAZOLE SODIUM 40 MG PO TBEC: 40.0000 mg | DELAYED_RELEASE_TABLET | Freq: Every day | ORAL | 0 refills | Status: DC

## 2021-04-26 NOTE — Progress Notes (Signed)
Discharge teaching completed w/ patient. Verbalizes understanding of d/c plan and follow up care. All questions answered.

## 2021-04-26 NOTE — Discharge Instructions (Signed)

## 2021-04-26 NOTE — Discharge Summary (Addendum)
Physician Discharge Summary  Joshua Berry NOB:[id number] DOB: 04/22/1946 DOA: 04/24/2021  PCP: Lorelei Pont, MD  Admit date: 04/24/2021 Discharge date: 04/26/2021  Time spent: 37 minutes  Recommendations for Outpatient Follow-up:  1. New medication Protonix twice daily 2. Outpatient follow-up [place] for gastroenterology subspecialty care-given patient had TIPS procedure 3. Recommend reinitiation of all diuretics in the outpatient setting with labs in about 1 to 2 weeks 4. Had appointment 04/25/2021 gastroenterology which will have to be to be scheduled-I have informed patient to follow-up 5. 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 octreotide gtt. saline Transfused 2 units today for hemoglobin dropping to 6.7 and post transfusion lab work showed acceptable rise to 8 Underwent endoscopy which showed various areas of bleeding as per GI which were cauterized and he was cleared for diet and cleared for discharge subsequently Patient was given a [facility] on [facility] daily for 3 months I also called in a 14-day supply of Protonix to his local pharmacy [name] cirrhosis status post TIPS procedure [facility] 11/2020 No signs symptoms of hepatic encephalopathy at this time F received 2 days of ceftriaxone Resumed lactulose Xifaxan On discharge I resumed patient's diuretics DM TY 2 Patient will have lower doses of home insulins and will resume semaglutide on discharge HLD hold statin at this time Hypothyroid continue Synthroid 100 daily Anemia of acute blood loss and iron deficiency Outpatient iron studies when able-continue ferrous sulfate 325 daily  Procedures: Impression:            - Normal duodenal bulb and second portion of the                         duodenum.                        -  Portal hypertensive gastropathy. Treated with                         bipolar cautery.                        - Normal gastroesophageal junction and esophagus.                        - No specimens collected. Recommendation:        - Return patient to hospital ward for ongoing care.                        - Low sodium diet today.                        - Continue present medications.                        -  Protonix 1m BID                        - D/C protoix drip                        - Return to GI clinic as previously scheduled   Consultations:  Gastroenterology  Discharge Exam: Vitals:   04/26/21 0343 04/26/21 0757  BP: 114/62 (!) 167/72  Pulse: 66 66  Resp: 18 16  Temp: 97.9 F (36.6 C) 98.4 F (36.9 C)  SpO2: 99% 98%    Subj on day of d/c   Awake alert had a full breakfast including an omelette no distress eating and drinking No pain No nausea  General Exam on discharge  EOMI NCAT no focal deficit CTA B no added sound Abdomen distended nontender no rebound no guarding No lower extremity edema No flap Neurologically intact power 5/5  Discharge Instructions   Discharge Instructions    Diet - low sodium heart healthy   Complete by: As directed    Discharge instructions   Complete by: As directed    You were diagnosed with areas of your stomach that had erosions and risk for bleeding you will need to definitely follow-up with gastroenterology in the outpatient setting, take Protonix twice a day As well as avoid nonsteroidal medications alcohol smoking etc. Should you have further bleeding please [facility] You may require blood work in about 1 to 2 weeks to make sure that your hemoglobin has stabilized and you should continue your iron supplements   Increase activity slowly   Complete by: As directed      Allergies as of 04/26/2021      Reactions   Lisinopril Hives   Sulfamethoxazole Swelling   Camphor Rash, Swelling   Latex Rash   Nickel Rash      Medication List    STOP taking these medications   omeprazole 20 MG capsule Commonly known as: PRILOSEC     TAKE these medications   albuterol 108 (90 Base) MCG/ACT inhaler Commonly known as: VENTOLIN HFA Inhale 1-2 puffs into the lungs every 6 (six) hours as needed for wheezing or shortness of breath.   atorvastatin 10  MG tablet Commonly known as: LIPITOR Take 20 mg by mouth daily.  augmented betamethasone dipropionate 0.05 % cream Commonly known as: DIPROLENE-AF Apply 1 application topically 2 (two) times daily.   carboxymethylcellulose 0.5 % Soln Commonly known as: REFRESH PLUS 1 drop at bedtime.   cholecalciferol 25 MCG (1000 UNIT) tablet Commonly known as: VITAMIN D Take 1,000 Units by mouth 2 (two) times daily.   [employer] ADV PROBIOTIC GUMMIES PO Take 1 each by mouth daily.   ferrous sulfate 325 (65 FE) MG tablet Take 325 mg by mouth daily with breakfast.   folic acid 1 MG tablet Commonly known as: FOLVITE Take 1 mg by mouth at bedtime.   furosemide 40 MG tablet Commonly known as: LASIX Take 1 tablet (40 mg total) by mouth daily.   Glycerin-Hypromellose-PEG 400 0.2-0.2-1 % Soln Apply 1 drop to eye 4 (four) times daily.   insulin aspart 100 UNIT/ML injection Commonly known as: novoLOG Inject 10 Units into the skin 3 (three) times daily before meals. What changed: how much to take   insulin glargine 100 UNIT/ML injection Commonly known as: LANTUS Inject 0.16 mLs (16 Units total) into the skin daily. What changed: how much to take   ipratropium-albuterol 0.5-2.5 (3) MG/3ML Soln Commonly known as: DUONEB Take 3 mLs by nebulization every 4 (four) hours as needed (shortness of breath).   ketotifen 0.025 % ophthalmic solution Commonly known as: ZADITOR Place 1 drop into both eyes 2 (two) times daily.   lactulose 10 GM/15ML solution Commonly known as: CHRONULAC Take 10 g by mouth 3 (three) times daily.   levothyroxine 100 MCG tablet Commonly known as: SYNTHROID Take 100 mcg by mouth daily before breakfast.   MAGNESIUM GLUCONATE PO Take 420 mg by mouth daily.   pantoprazole 40 MG tablet Commonly known as: Protonix Take 1 tablet (40 mg total) by mouth 2 (two) times daily.   rifaximin 550 MG Tabs tablet Commonly known as: XIFAXAN Take 550 mg by mouth 2 (two) times daily.    Semaglutide(0.25 or 0.5MG/DOS) 2 MG/1.5ML Sopn Inject 0.5 mg into the skin once a week.   terazosin 5 MG capsule Commonly known as: HYTRIN Take 5 mg by mouth at bedtime.   vitamin C 500 MG tablet Commonly known as: ASCORBIC ACID Take 500 mg by mouth daily.      Allergies  Allergen Reactions  . Lisinopril Hives  . Sulfamethoxazole Swelling  . Camphor Rash and Swelling  . Latex Rash  . Nickel Rash      The results of significant diagnostics from this hospitalization (including imaging, microbiology, ancillary and laboratory) are listed below for reference.    Significant Diagnostic Studies: No results found.  Microbiology: Recent Results (from the past 240 hour(s))  Resp Panel by RT-PCR (Flu A&B, Covid) Nasopharyngeal Swab     Status: None   Collection Time: 04/24/21 11:20 PM   Specimen: Nasopharyngeal Swab; Nasopharyngeal(NP) swabs in vial transport medium  Result Value Ref Range Status   SARS Coronavirus 2 by RT PCR NEGATIVE NEGATIVE Final    Comment: (NOTE) SARS-CoV-2 target nucleic acids are NOT DETECTED.  The SARS-CoV-2 RNA is generally detectable in upper respiratory specimens during the acute phase of infection. The lowest concentration of SARS-CoV-2 viral copies this assay can detect is 138 copies/mL. A negative result does not preclude SARS-Cov-2 infection and should not be used as the sole basis for treatment or other patient management decisions. A negative result may occur with  improper specimen collection/handling, submission of specimen other than nasopharyngeal swab, presence of viral mutation(s) within the areas targeted by  this assay, and inadequate number of viral copies(<138 copies/mL). A negative result must be combined with clinical observations, patient history, and epidemiological information. The expected result is Negative.  Fact Sheet for Patients:  EntrepreneurPulse.com.au  Fact Sheet for Healthcare Providers:   IncredibleEmployment.be  This test is no t yet approved or cleared by the [place] FDA and  has been authorized for detection and/or diagnosis of SARS-CoV-2 by FDA under an Emergency Use Authorization (EUA). This EUA will remain  in effect (meaning this test can be used) for the duration of the COVID-19 declaration under Section 564(b)(1) of the Act, 21 U.S.C.section 360bbb-3(b)(1), unless the authorization is terminated  or revoked sooner.       Influenza A by PCR NEGATIVE NEGATIVE Final   Influenza B by PCR NEGATIVE NEGATIVE Final    Comment: (NOTE) The Xpert Xpress SARS-CoV-2/FLU/RSV plus assay is intended as an aid in the diagnosis of influenza from Nasopharyngeal swab specimens and should not be used as a sole basis for treatment. Nasal washings and aspirates are unacceptable for Xpert Xpress SARS-CoV-2/FLU/RSV testing.  Fact Sheet for Patients: EntrepreneurPulse.com.au  Fact Sheet for Healthcare Providers: IncredibleEmployment.be  This test is not yet approved or cleared by the [place] FDA and has been authorized for detection and/or diagnosis of SARS-CoV-2 by FDA under an Emergency Use Authorization (EUA). This EUA will remain in effect (meaning this test can be used) for the duration of the COVID-19 declaration under Section 564(b)(1) of the Act, 21 U.S.C. section 360bbb-3(b)(1), unless the authorization is terminated or revoked.  Performed at [facility], [address] [place], [place] [address]      Labs: Basic Metabolic Panel: Recent Labs  Lab 04/24/21 2000 04/25/21 0522 04/26/21 0354  NA 139 140 139  K 3.7 3.9 3.8  CL 108 111 112*  CO2 24 21* 20*  GLUCOSE 168* 94 121*  BUN 15 15 16   CREATININE 1.02 0.88 0.98  CALCIUM 8.4* 8.1* 8.1*   Liver Function Tests: Recent Labs  Lab 04/25/21 0802 04/26/21 0354  AST 46* 45*  ALT 38 36  ALKPHOS 75 70  BILITOT 0.9 1.7*  PROT  5.3* 5.1*  ALBUMIN 2.7* 2.8*   No results for input(s): LIPASE, AMYLASE in the last 168 hours. No results for input(s): AMMONIA in the last 168 hours. CBC: Recent Labs  Lab 04/24/21 2000 04/25/21 0522 04/26/21 0354  WBC 2.1* 2.0* 2.2*  NEUTROABS  --   --  1.2*  HGB 7.5* 6.7* 8.2*  HCT 22.9* 20.8* 24.4*  MCV 95.8 95.4 91.0  PLT 82* 74* 83*   Cardiac Enzymes: No results for input(s): CKTOTAL, CKMB, CKMBINDEX, TROPONINI in the last 168 hours. BNP: BNP (last 3 results) No results for input(s): BNP in the last 8760 hours.  ProBNP (last 3 results) No results for input(s): PROBNP in the last 8760 hours.  CBG: Recent Labs  Lab 04/25/21 0825 04/25/21 1139 04/25/21 1416 04/25/21 1603 04/26/21 0753  GLUCAP 128* 140* 159* 153* 122*       Signed:  Nita Sells MD   [facility] 04/26/2021, 9:07 AM

## 2021-05-06 ENCOUNTER — Emergency Department: Payer: No Typology Code available for payment source

## 2021-05-06 ENCOUNTER — Emergency Department
Admission: EM | Admit: 2021-05-06 | Discharge: 2021-05-06 | Disposition: A | Discharge status: Home / Self Care | Payer: No Typology Code available for payment source | Attending: Emergency Medicine | Admitting: Emergency Medicine

## 2021-05-06 ENCOUNTER — Other Ambulatory Visit: Payer: Self-pay

## 2021-05-06 DIAGNOSIS — R079 Chest pain, unspecified: Secondary | ICD-10-CM | POA: Diagnosis present

## 2021-05-06 DIAGNOSIS — Z87891 Personal history of nicotine dependence: Secondary | ICD-10-CM | POA: Diagnosis not present

## 2021-05-06 DIAGNOSIS — M542 Cervicalgia: Secondary | ICD-10-CM | POA: Diagnosis not present

## 2021-05-06 DIAGNOSIS — Z91040 Latex allergy status: Secondary | ICD-10-CM | POA: Diagnosis not present

## 2021-05-06 DIAGNOSIS — Z859 Personal history of malignant neoplasm, unspecified: Secondary | ICD-10-CM | POA: Insufficient documentation

## 2021-05-06 DIAGNOSIS — Z79899 Other long term (current) drug therapy: Secondary | ICD-10-CM | POA: Insufficient documentation

## 2021-05-06 DIAGNOSIS — Z794 Long term (current) use of insulin: Secondary | ICD-10-CM | POA: Diagnosis not present

## 2021-05-06 DIAGNOSIS — R0602 Shortness of breath: Secondary | ICD-10-CM | POA: Insufficient documentation

## 2021-05-06 DIAGNOSIS — I10 Essential (primary) hypertension: Secondary | ICD-10-CM | POA: Insufficient documentation

## 2021-05-06 DIAGNOSIS — E119 Type 2 diabetes mellitus without complications: Secondary | ICD-10-CM | POA: Diagnosis not present

## 2021-05-06 DIAGNOSIS — R110 Nausea: Secondary | ICD-10-CM | POA: Diagnosis not present

## 2021-05-06 HISTORY — DX: Chronic or unspecified gastrojejunal ulcer with hemorrhage: K28.4

## 2021-05-06 HISTORY — DX: Chronic or unspecified peptic ulcer, site unspecified, with hemorrhage: K27.4

## 2021-05-06 LAB — CBC
HCT: 28 % — ABNORMAL LOW (ref 39.0–52.0)
Hemoglobin: 9.3 g/dL — ABNORMAL LOW (ref 13.0–17.0)
MCH: 30.2 pg (ref 26.0–34.0)
MCHC: 33.2 g/dL (ref 30.0–36.0)
MCV: 90.9 fL (ref 80.0–100.0)
Platelets: 79 10*3/uL — ABNORMAL LOW (ref 150–400)
RBC: 3.08 MIL/uL — ABNORMAL LOW (ref 4.22–5.81)
RDW: 18.7 % — ABNORMAL HIGH (ref 11.5–15.5)
WBC: 2.6 10*3/uL — ABNORMAL LOW (ref 4.0–10.5)
nRBC: 0 % (ref 0.0–0.2)

## 2021-05-06 LAB — BASIC METABOLIC PANEL
Anion gap: 9 (ref 5–15)
BUN: 12 mg/dL (ref 8–23)
CO2: 21 mmol/L — ABNORMAL LOW (ref 22–32)
Calcium: 9.1 mg/dL (ref 8.9–10.3)
Chloride: 110 mmol/L (ref 98–111)
Creatinine, Ser: 1.04 mg/dL (ref 0.61–1.24)
GFR, Estimated: >60 mL/min (ref >60)
Glucose, Bld: 175 mg/dL — ABNORMAL HIGH (ref 70–99)
Potassium: 3.9 mmol/L (ref 3.5–5.1)
Sodium: 140 mmol/L (ref 135–145)

## 2021-05-06 LAB — TROPONIN I (HIGH SENSITIVITY)
Troponin I (High Sensitivity): 8 ng/L (ref <18)
Troponin I (High Sensitivity): 8 ng/L (ref <18)

## 2021-05-06 MED ORDER — ALUM & MAG HYDROXIDE-SIMETH 200-200-20 MG/5ML PO SUSP
30.0000 mL | Freq: Once | ORAL | Status: AC
  Administered 2021-05-06: 30 mL via ORAL
  Filled 2021-05-06: qty 30

## 2021-05-06 MED ORDER — SUCRALFATE 1 G PO TABS: 1.0000 g | ORAL_TABLET | Freq: Four times a day (QID) | ORAL | 0 refills | Status: DC

## 2021-05-06 MED ORDER — LIDOCAINE VISCOUS HCL 2 % MT SOLN
15.0000 mL | Freq: Once | OROMUCOSAL | Status: AC
  Administered 2021-05-06: 15 mL via ORAL
  Filled 2021-05-06: qty 15

## 2021-05-06 NOTE — ED Provider Notes (Signed)
[facility] Emergency Department Provider Note   ____________________________________________   I have reviewed the triage vital signs and the nursing notes.   HISTORY  Chief Complaint Chest Pain   History limited by: Not Limited   HPI Joshua Berry is a 75 y.o. male who presents to the emergency department today because of concern for chest pain. Located in the center part of his chest the patient describes it as pressure like. He does have some nausea type feeling in his upper neck with the pressure. Denies any vomiting. No associated shortness of breath. The patient states that he has had similar symptoms in the past but never for as long or as severe. Denies any unusual activity today.    Records reviewed. Per medical record review patient has a history of HTN, cirrhosis.   Past Medical History:  Diagnosis Date  . Arthritis   . Bleeding ulcer   . Cancer (HCC)    melanoma, carcinoma  . Cirrhosis of liver ([place])   . Diabetes mellitus without complication ([place])   . Heart murmur   . Hypertension   . Iron deficiency anemia   . NASH (nonalcoholic steatohepatitis)   . Sleep apnea   . Spleen enlarged   . Thrombocytopenia [facility])     Patient Active Problem List   Diagnosis Date Noted  . GI bleeding 04/24/2021  . Cirrhosis of liver with ascites ([place])   . Secondary esophageal varices without bleeding ([place])   . Portal hypertension ([place])   . Stomach irritation   . Melena   . Acute gastrointestinal hemorrhage 07/19/2019  . Sepsis ([place]) 12/20/2018    Past Surgical History:  Procedure Laterality Date  . CHOLECYSTECTOMY    . COLONOSCOPY N/A 07/23/2019   Procedure: COLONOSCOPY;  Surgeon: Lin Landsman, MD;  Location: [facility] ENDOSCOPY;  Service: Gastroenterology;  Laterality: N/A;  . ESOPHAGOGASTRODUODENOSCOPY N/A 07/20/2019   Procedure: ESOPHAGOGASTRODUODENOSCOPY (EGD);  Surgeon: Lin Landsman, MD;  Location: [facility] ENDOSCOPY;  Service:  Gastroenterology;  Laterality: N/A;  . ESOPHAGOGASTRODUODENOSCOPY Left 08/20/2019   Procedure: ESOPHAGOGASTRODUODENOSCOPY (EGD);  Surgeon: Virgel Manifold, MD;  Location: [facility] ENDOSCOPY;  Service: Endoscopy;  Laterality: Left;  . ESOPHAGOGASTRODUODENOSCOPY (EGD) WITH PROPOFOL N/A 04/25/2021   Procedure: ESOPHAGOGASTRODUODENOSCOPY (EGD) WITH PROPOFOL;  Surgeon: Lin Landsman, MD;  Location: [facility] ENDOSCOPY;  Service: Gastroenterology;  Laterality: N/A;  . SKIN BIOPSY    . TEE WITHOUT CARDIOVERSION N/A 12/23/2018   Procedure: TRANSESOPHAGEAL ECHOCARDIOGRAM (TEE);  Surgeon: Teodoro Spray, MD;  Location: [facility] ORS;  Service: Cardiovascular;  Laterality: N/A;    Prior to Admission medications   Medication Sig Start Date End Date Taking? Authorizing Provider  albuterol (VENTOLIN HFA) 108 (90 Base) MCG/ACT inhaler Inhale 1-2 puffs into the lungs every 6 (six) hours as needed for wheezing or shortness of breath.    [provider]  atorvastatin (LIPITOR) 10 MG tablet Take 20 mg by mouth daily.    [provider]  augmented betamethasone dipropionate (DIPROLENE-AF) 0.05 % cream Apply 1 application topically 2 (two) times daily.    [provider]  carboxymethylcellulose (REFRESH PLUS) 0.5 % SOLN 1 drop at bedtime.    [provider]  cholecalciferol (VITAMIN D) 25 MCG (1000 UT) tablet Take 1,000 Units by mouth 2 (two) times daily.     [provider]  ferrous sulfate 325 (65 FE) MG tablet Take 325 mg by mouth daily with breakfast.    [provider]  folic acid (FOLVITE) 1 MG tablet  Take 1 mg by mouth at bedtime.    [provider]  furosemide (LASIX) 40 MG tablet Take 1 tablet (40 mg total) by mouth daily. 07/24/19   Max Sane, MD  Glycerin-Hypromellose-PEG 400 0.2-0.2-1 % SOLN Apply 1 drop to eye 4 (four) times daily.    [provider]  insulin aspart (NOVOLOG) 100 UNIT/ML injection Inject 10 Units into the skin 3 (three)  times daily before meals. 04/26/21   Nita Sells, MD  insulin glargine (LANTUS) 100 UNIT/ML injection Inject 0.16 mLs (16 Units total) into the skin daily. 04/26/21   Nita Sells, MD  ipratropium-albuterol (DUONEB) 0.5-2.5 (3) MG/3ML SOLN Take 3 mLs by nebulization every 4 (four) hours as needed (shortness of breath).     [provider]  ketotifen (ZADITOR) 0.025 % ophthalmic solution Place 1 drop into both eyes 2 (two) times daily.    [provider]  lactulose (CHRONULAC) 10 GM/15ML solution Take 10 g by mouth 3 (three) times daily.    [provider]  levothyroxine (SYNTHROID, LEVOTHROID) 100 MCG tablet Take 100 mcg by mouth daily before breakfast.    [provider]  MAGNESIUM GLUCONATE PO Take 420 mg by mouth daily.    [provider]  pantoprazole (PROTONIX) 40 MG tablet Take 1 tablet (40 mg total) by mouth 2 (two) times daily. 04/26/21 06/25/21  Nita Sells, MD  pantoprazole (PROTONIX) 40 MG tablet Take 1 tablet (40 mg total) by mouth 2 (two) times daily. 04/26/21 04/26/22  Nita Sells, MD  pantoprazole (PROTONIX) 40 MG tablet Take 1 tablet (40 mg total) by mouth daily for 14 days. 04/26/21 05/10/21  Nita Sells, MD  Probiotic Product ([employer] ADV PROBIOTIC GUMMIES PO) Take 1 each by mouth daily.    [provider]  rifaximin (XIFAXAN) 550 MG TABS tablet Take 550 mg by mouth 2 (two) times daily.    [provider]  Semaglutide,0.25 or 0.5MG/DOS, 2 MG/1.5ML SOPN Inject 0.5 mg into the skin once a week.    [provider]  terazosin (HYTRIN) 5 MG capsule Take 5 mg by mouth at bedtime.    [provider]  vitamin C (ASCORBIC ACID) 500 MG tablet Take 500 mg by mouth daily.    [provider]    Allergies Lisinopril, Sulfamethoxazole, Camphor, Latex, and Nickel  History reviewed. No pertinent family history.  Social History Social History   Tobacco Use  . Smoking  status: Former Research scientist (life sciences)  . Smokeless tobacco: Never Used  Vaping Use  . Vaping Use: Never used  Substance Use Topics  . Alcohol use: Not Currently  . Drug use: Not Currently    Review of Systems Constitutional: No fever/chills Eyes: No visual changes. ENT: No sore throat. Cardiovascular: Positive for chest pressure.  Respiratory: Denies shortness of breath. Gastrointestinal: No abdominal pain.  No nausea, no vomiting.  No diarrhea.   Genitourinary: Negative for dysuria. Musculoskeletal: Negative for back pain. Skin: Negative for rash. Neurological: Negative for headaches, focal weakness or numbness.  ____________________________________________   PHYSICAL EXAM:  VITAL SIGNS: ED Triage Vitals  Enc Vitals Group     BP 05/06/21 1627 (!) 122/58     Pulse Rate 05/06/21 1627 74     Resp 05/06/21 1627 18     Temp 05/06/21 1627 97.9 F (36.6 C)     Temp Source 05/06/21 1627 Oral     SpO2 05/06/21 1627 99 %     Weight 05/06/21 1630 231 lb (104.8 kg)  Height 05/06/21 1630 6' (1.829 m)     Head Circumference --      Peak Flow --      Pain Score 05/06/21 1631 8   Constitutional: Alert and oriented.  Eyes: Conjunctivae are normal.  ENT      Head: Normocephalic and atraumatic.      Nose: No congestion/rhinnorhea.      Mouth/Throat: Mucous membranes are moist.      Neck: No stridor. Hematological/Lymphatic/Immunilogical: No cervical lymphadenopathy. Cardiovascular: Normal rate, regular rhythm.  No murmurs, rubs, or gallops.  Respiratory: Normal respiratory effort without tachypnea nor retractions. Breath sounds are clear and equal bilaterally. No wheezes/rales/rhonchi. Gastrointestinal: Soft and non tender. No rebound. No guarding.  Genitourinary: Deferred Musculoskeletal: Normal range of motion in all extremities. No lower extremity edema. Neurologic:  Normal speech and language. No gross focal neurologic deficits are appreciated.  Skin:  Skin is warm, dry and intact. No  rash noted. Psychiatric: Mood and affect are normal. Speech and behavior are normal. Patient exhibits appropriate insight and judgment.  ____________________________________________    LABS (pertinent positives/negatives)  CBC wbc 2.6, hgb 9.3, plt 79 BMP wnl except co2 21, glu 175 Trop hs 8 ____________________________________________   EKG  I, Nance Pear, attending physician, personally viewed and interpreted this EKG  EKG Time: 1629  Rate: 73 Rhythm: normal sinus rhythm  Axis: left axis deviation Intervals: qtc 504 QRS: narrow ST changes: no st elevation Impression: abnormal ekg  ____________________________________________    RADIOLOGY  CXR No active cardiopulmonary disease  ____________________________________________   PROCEDURES  Procedures  ____________________________________________   INITIAL IMPRESSION / ASSESSMENT AND PLAN / ED COURSE  Pertinent labs & imaging results that were available during my care of the patient were reviewed by me and considered in my medical decision making (see chart for details).   Patient presents to the emergency department today because of concern for chest pain. Troponin negative x 2. cxr without concerning findings. Doubt dissection or PE given clinic history. Additionally patient did feel improvement after gi cocktail. At this time I think esophagitis likely. Discussed this with the patient. Will add on sucralfate to patient's medication regimen.   ____________________________________________   FINAL CLINICAL IMPRESSION(S) / ED DIAGNOSES  Final diagnoses:  Chest pain, unspecified type     Note: This dictation was prepared with Dragon dictation. Any transcriptional errors that result from this process are unintentional     Nance Pear, MD 05/06/21 [phone]

## 2021-05-06 NOTE — ED Notes (Signed)
Patient transported to X-ray 

## 2021-05-06 NOTE — ED Triage Notes (Signed)
Pt presents via EMS from home for c/o chest pressure x 2 hrs that started while watching TV. Pt sts pain is worse with exertion and has associated SOB and nausea. Pt reports feeling fatigued. Hx of anemia and liver disease; notes his normal Hgb is 7-8. Pt reports having EGD last week and was diagnosed with perforated and bleeding ulcers in the "lower abdomen". Pt denies dark or bloody stool.

## 2021-05-06 NOTE — Discharge Instructions (Addendum)
Please seek medical attention for any high fevers, chest pain, shortness of breath, change in behavior, persistent vomiting, bloody stool or any other new or concerning symptoms.  

## 2021-06-05 ENCOUNTER — Ambulatory Visit: Payer: No Typology Code available for payment source | Admitting: Infectious Diseases

## 2021-11-16 ENCOUNTER — Encounter: Payer: No Typology Code available for payment source | Attending: Internal Medicine

## 2021-11-16 ENCOUNTER — Other Ambulatory Visit: Payer: Self-pay

## 2021-11-16 DIAGNOSIS — Z48812 Encounter for surgical aftercare following surgery on the circulatory system: Secondary | ICD-10-CM | POA: Insufficient documentation

## 2021-11-16 DIAGNOSIS — Z952 Presence of prosthetic heart valve: Secondary | ICD-10-CM | POA: Insufficient documentation

## 2021-11-16 NOTE — Progress Notes (Signed)
Virtual Visit completed. Patient informed on EP and RD appointment and 6 Minute walk test. Patient also informed of patient health questionnaires on My Chart. Patient Verbalizes understanding. Visit diagnosis can be found in [facility] 09/02/2021.

## 2021-12-04 ENCOUNTER — Other Ambulatory Visit: Payer: Self-pay

## 2021-12-04 VITALS — Ht 71.75 in | Wt 234.8 lb

## 2021-12-04 DIAGNOSIS — Z952 Presence of prosthetic heart valve: Secondary | ICD-10-CM

## 2021-12-04 DIAGNOSIS — Z48812 Encounter for surgical aftercare following surgery on the circulatory system: Secondary | ICD-10-CM | POA: Diagnosis not present

## 2021-12-04 NOTE — Patient Instructions (Signed)
Patient Instructions  Patient Details  Name: Joshua Berry MRN: [id number] Date of Birth: September 01, 1946 Referring Provider:  Lorelei Pont, MD  Below are your personal goals for exercise, nutrition, and risk factors. Our goal is to help you stay on track towards obtaining and maintaining these goals. We will be discussing your progress on these goals with you throughout the program.  Initial Exercise Prescription:  Initial Exercise Prescription - 12/04/21 1000       Date of Initial Exercise RX and Referring Provider   Date 12/04/21    Referring Provider Lorelei Pont MD ([facility])      Oxygen   Maintain Oxygen Saturation 88% or higher      Recumbant Bike   Level 1    RPM 60    Watts 15    Minutes 15    METs 1      NuStep   Level 1    SPM 80    Minutes 15    METs 1      REL-XR   Level 1    Speed 50    Minutes 15    METs 1      Track   Laps 10    Minutes 15    METs 1.54      Prescription Details   Frequency (times per week) 3    Duration Progress to 30 minutes of continuous aerobic without signs/symptoms of physical distress      Intensity   THRR 40-80% of Max Heartrate 94-127    Ratings of Perceived Exertion 11-13    Perceived Dyspnea 0-4      Progression   Progression Continue to progress workloads to maintain intensity without signs/symptoms of physical distress.      Resistance Training   Training Prescription Yes    Weight 3 lb    Reps 10-15             Exercise Goals: Frequency: Be able to perform aerobic exercise two to three times per week in program working toward 2-5 days per week of home exercise.  Intensity: Work with a perceived exertion of 11 (fairly light) - 15 (hard) while following your exercise prescription.  We will make changes to your prescription with you as you progress through the program.   Duration: Be able to do 30 to 45 minutes of continuous aerobic exercise in addition to a 5 minute warm-up and a 5 minute cool-down  routine.   Nutrition Goals: Your personal nutrition goals will be established when you do your nutrition analysis with the dietician.  The following are general nutrition guidelines to follow: Cholesterol < 278m/day Sodium < 15010mday Fiber: Men over 50 yrs - 30 grams per day  Personal Goals:  Personal Goals and Risk Factors at Admission - 12/04/21 1043       Core Components/Risk Factors/Patient Goals on Admission    Weight Management Yes;Weight Loss   patient would like to maintain   Intervention Weight Management: Develop a combined nutrition and exercise program designed to reach desired caloric intake, while maintaining appropriate intake of nutrient and fiber, sodium and fats, and appropriate energy expenditure required for the weight goal.;Weight Management/Obesity: Establish reasonable short term and long term weight goals.;Weight Management: Provide education and appropriate resources to help participant work on and attain dietary goals.    Admit Weight 234 lb (106.1 kg)    Goal Weight: Short Term 230 lb (104.3 kg)    Goal Weight: Long Term 227 lb (103 kg)  Expected Outcomes Short Term: Continue to assess and modify interventions until short term weight is achieved;Long Term: Adherence to nutrition and physical activity/exercise program aimed toward attainment of established weight goal;Weight Loss: Understanding of general recommendations for a balanced deficit meal plan, which promotes 1-2 lb weight loss per week and includes a negative energy balance of [phone] kcal/d;Understanding recommendations for meals to include 15-35% energy as protein, 25-35% energy from fat, 35-60% energy from carbohydrates, less than 276m of dietary cholesterol, 20-35 gm of total fiber daily;Understanding of distribution of calorie intake throughout the day with the consumption of 4-5 meals/snacks    Diabetes Yes    Intervention Provide education about signs/symptoms and action to take for  hypo/hyperglycemia.;Provide education about proper nutrition, including hydration, and aerobic/resistive exercise prescription along with prescribed medications to achieve blood glucose in normal ranges: Fasting glucose 65-99 mg/dL    Expected Outcomes Short Term: Participant verbalizes understanding of the signs/symptoms and immediate care of hyper/hypoglycemia, proper foot care and importance of medication, aerobic/resistive exercise and nutrition plan for blood glucose control.;Long Term: Attainment of HbA1C < 7%.    Lipids Yes    Intervention Provide education and support for participant on nutrition & aerobic/resistive exercise along with prescribed medications to achieve LDL <759m HDL >4054m   Expected Outcomes Short Term: Participant states understanding of desired cholesterol values and is compliant with medications prescribed. Participant is following exercise prescription and nutrition guidelines.;Long Term: Cholesterol controlled with medications as prescribed, with individualized exercise RX and with personalized nutrition plan. Value goals: LDL < 83m12mDL > 40 mg.             Tobacco Use Initial Evaluation: Social History   Tobacco Use  Smoking Status Former   Packs/day: 0.50   Years: 5.00   Pack years: 2.50   Types: Cigarettes   Quit date: 10/16/1968   Years since quitting: 53.1  Smokeless Tobacco Never    Exercise Goals and Review:  Exercise Goals     Row Name 12/04/21 1043             Exercise Goals   Increase Physical Activity Yes       Intervention Provide advice, education, support and counseling about physical activity/exercise needs.;Develop an individualized exercise prescription for aerobic and resistive training based on initial evaluation findings, risk stratification, comorbidities and participant's personal goals.       Expected Outcomes Short Term: Attend rehab on a regular basis to increase amount of physical activity.;Long Term: Add in home  exercise to make exercise part of routine and to increase amount of physical activity.;Long Term: Exercising regularly at least 3-5 days a week.       Increase Strength and Stamina Yes       Intervention Develop an individualized exercise prescription for aerobic and resistive training based on initial evaluation findings, risk stratification, comorbidities and participant's personal goals.;Provide advice, education, support and counseling about physical activity/exercise needs.       Expected Outcomes Short Term: Increase workloads from initial exercise prescription for resistance, speed, and METs.;Short Term: Perform resistance training exercises routinely during rehab and add in resistance training at home;Long Term: Improve cardiorespiratory fitness, muscular endurance and strength as measured by increased METs and functional capacity (6MWT)       Able to understand and use rate of perceived exertion (RPE) scale Yes       Intervention Provide education and explanation on how to use RPE scale       Expected Outcomes Short  Term: Able to use RPE daily in rehab to express subjective intensity level;Long Term:  Able to use RPE to guide intensity level when exercising independently       Able to understand and use Dyspnea scale Yes       Intervention Provide education and explanation on how to use Dyspnea scale       Expected Outcomes Short Term: Able to use Dyspnea scale daily in rehab to express subjective sense of shortness of breath during exertion;Long Term: Able to use Dyspnea scale to guide intensity level when exercising independently       Knowledge and understanding of Target Heart Rate Range (THRR) Yes       Intervention Provide education and explanation of THRR including how the numbers were predicted and where they are located for reference       Expected Outcomes Short Term: Able to state/look up THRR;Long Term: Able to use THRR to govern intensity when exercising independently;Short Term:  Able to use daily as guideline for intensity in rehab       Able to check pulse independently Yes       Intervention Provide education and demonstration on how to check pulse in carotid and radial arteries.;Review the importance of being able to check your own pulse for safety during independent exercise       Expected Outcomes Short Term: Able to explain why pulse checking is important during independent exercise;Long Term: Able to check pulse independently and accurately       Understanding of Exercise Prescription Yes       Intervention Provide education, explanation, and written materials on patient's individual exercise prescription       Expected Outcomes Short Term: Able to explain program exercise prescription;Long Term: Able to explain home exercise prescription to exercise independently                Copy of goals given to participant.

## 2021-12-04 NOTE — Progress Notes (Signed)
Cardiac Individual Treatment Plan  Patient Details  Name: Joshua Berry MRN: [id number] Date of Birth: 1946-09-13 Referring Provider:   Flowsheet Row Cardiac Rehab from 12/04/2021 in [facility] Cardiac and Pulmonary Rehab  Referring Provider Lorelei Pont MD ([place])       Initial Encounter Date:  Flowsheet Row Cardiac Rehab from 12/04/2021 in [facility] Cardiac and Pulmonary Rehab  Date 12/04/21       Visit Diagnosis: S/P TAVR (transcatheter aortic valve replacement)  Patient's Home Medications on Admission:  Current Outpatient Medications:    albuterol (VENTOLIN HFA) 108 (90 Base) MCG/ACT inhaler, Inhale 1-2 puffs into the lungs every 6 (six) hours as needed for wheezing or shortness of breath. (Patient not taking: Reported on 11/16/2021), Disp: , Rfl:    albuterol (VENTOLIN HFA) 108 (90 Base) MCG/ACT inhaler, Inhale into the lungs., Disp: , Rfl:    amoxicillin (AMOXIL) 500 MG capsule, TAKE FOUR CAPSULES BY MOUTH AS DIRECTED BY YOUR PROVIDER 30 TO 60 MINUTES PRIOR TO PROCEDURES., Disp: , Rfl:    ascorbic acid (VITAMIN C) 500 MG tablet, Take 2 tablets by mouth daily., Disp: , Rfl:    atorvastatin (LIPITOR) 10 MG tablet, Take 20 mg by mouth daily., Disp: , Rfl:    augmented betamethasone dipropionate (DIPROLENE-AF) 0.05 % cream, Apply 1 application topically 2 (two) times daily., Disp: , Rfl:    carboxymethylcellulose (REFRESH PLUS) 0.5 % SOLN, 1 drop at bedtime., Disp: , Rfl:    Carboxymethylcellulose Sodium 1 % GEL, APPLY 1 DROP TO EACH EYE AT BEDTIME (Patient not taking: Reported on 11/16/2021), Disp: , Rfl:    cholecalciferol (VITAMIN D) 25 MCG (1000 UT) tablet, Take 1,000 Units by mouth 2 (two) times daily.  (Patient not taking: Reported on 11/16/2021), Disp: , Rfl:    Cholecalciferol 50 MCG (2000 UT) TABS, Take 1 tablet by mouth daily., Disp: , Rfl:    famotidine (PEPCID) 40 MG tablet, Take by mouth., Disp: , Rfl:    ferrous sulfate 325 (65 FE) MG tablet, Take 325 mg by mouth daily with  breakfast. (Patient not taking: Reported on 11/16/2021), Disp: , Rfl:    fluorouracil (EFUDEX) 5 % cream, APPLY AS DIRECTED TO AFFECTED AREA AS DIRECTED START OCT OR JAN. APPLY 4-6 WEEKS AS TOLERATED; [place], PAT DRY, DO NOT RUB; TREAT: SCALP/FACE/EARS/NECK/ARMS; DO NOT APPLY NEAR EYES, NOSTRILS, MOUTH; [place] HANDS AFTERWARDS; APPLY MOISTURIZER EVERY 2 HOURS WHEN AWAKE; AVOID SUN. SEE HANDOUT, Disp: , Rfl:    fluticasone (FLONASE) 50 MCG/ACT nasal spray, Place 1 spray into both nostrils daily., Disp: , Rfl:    folic acid (FOLVITE) 1 MG tablet, Take 1 mg by mouth at bedtime. (Patient not taking: Reported on 11/16/2021), Disp: , Rfl:    folic acid (FOLVITE) 1 MG tablet, Take 1 tablet by mouth daily., Disp: , Rfl:    furosemide (LASIX) 20 MG tablet, Take by mouth., Disp: , Rfl:    furosemide (LASIX) 40 MG tablet, Take 1 tablet (40 mg total) by mouth daily., Disp: 30 tablet, Rfl: 0   Glycerin-Hypromellose-PEG 400 0.2-0.2-1 % SOLN, Apply 1 drop to eye 4 (four) times daily., Disp: , Rfl:    insulin aspart (NOVOLOG) 100 UNIT/ML FlexPen, Inject into the skin., Disp: , Rfl:    insulin aspart (NOVOLOG) 100 UNIT/ML injection, Inject 10 Units into the skin 3 (three) times daily before meals. (Patient not taking: Reported on 11/16/2021), Disp: 10 mL, Rfl: 11   insulin glargine (LANTUS) 100 UNIT/ML injection, Inject 0.16 mLs (16 Units total) into the  skin daily. (Patient not taking: Reported on 11/16/2021), Disp: 10 mL, Rfl: 11   insulin glargine (LANTUS) 100 UNIT/ML Solostar Pen, Inject into the skin., Disp: , Rfl:    insulin glargine-yfgn (SEMGLEE) 100 UNIT/ML Pen, INJECT 14 UNITS SUBCUTANEOUSLY DAILY (USE WITHIN 28 DAYS AFTER OPENING PEN) (Patient not taking: Reported on 11/16/2021), Disp: , Rfl:    ipratropium-albuterol (DUONEB) 0.5-2.5 (3) MG/3ML SOLN, Take 3 mLs by nebulization every 4 (four) hours as needed (shortness of breath).  (Patient not taking: Reported on 11/16/2021), Disp: , Rfl:    ketotifen (ZADITOR) 0.025  % ophthalmic solution, Place 1 drop into both eyes 2 (two) times daily. (Patient not taking: Reported on 11/16/2021), Disp: , Rfl:    ketotifen (ZADITOR) 0.025 % ophthalmic solution, Place 1 drop into both eyes as needed (for allergies)., Disp: , Rfl:    Lactobacillus Rhamnosus, GG, (RA PROBIOTIC DIGESTIVE CARE) CAPS, Take 1 capsule by mouth daily., Disp: , Rfl:    lactulose (CHRONULAC) 10 GM/15ML solution, Take 10 g by mouth 3 (three) times daily. (Patient not taking: Reported on 11/16/2021), Disp: , Rfl:    lactulose (CHRONULAC) 10 GM/15ML solution, Take by mouth., Disp: , Rfl:    levothyroxine (SYNTHROID) 100 MCG tablet, TAKE ONE TABLET BY MOUTH DAILY ON AN EMPTY STOMACH, Disp: , Rfl:    levothyroxine (SYNTHROID, LEVOTHROID) 100 MCG tablet, Take 100 mcg by mouth daily before breakfast. (Patient not taking: Reported on 11/16/2021), Disp: , Rfl:    Lidocaine 4 % PTCH, Place onto the skin., Disp: , Rfl:    MAGNESIUM GLUCONATE PO, Take 420 mg by mouth daily. (Patient not taking: Reported on 11/16/2021), Disp: , Rfl:    Magnesium Oxide 420 MG TABS, Take 2 tablets by mouth 2 (two) times daily., Disp: , Rfl:    octreotide (SANDOSTATIN LAR) 20 MG injection, INJECT 1 VIAL (20MG) INTRAMUSCULARLY EVERY 28 DAYS (FOR INFUSION CLINIC), Disp: , Rfl:    Omega-3 Fatty Acids (FISH OIL) 1000 MG CAPS, Take 1 capsule by mouth 2 (two) times daily., Disp: , Rfl:    pantoprazole (PROTONIX) 40 MG tablet, Take 1 tablet (40 mg total) by mouth 2 (two) times daily., Disp: 30 tablet, Rfl: 3   pantoprazole (PROTONIX) 40 MG tablet, Take 1 tablet (40 mg total) by mouth 2 (two) times daily., Disp: 180 tablet, Rfl: 3   pantoprazole (PROTONIX) 40 MG tablet, Take 1 tablet (40 mg total) by mouth daily for 14 days., Disp: 14 tablet, Rfl: 0   Polyethyl Glycol-Propyl Glycol 0.4-0.3 % SOLN, INSTILL 1 DROP IN EACH EYE FOUR TIMES A DAY, Disp: , Rfl:    Probiotic Product ([employer] ADV PROBIOTIC GUMMIES PO), Take 1 each by mouth daily., Disp: , Rfl:     rifaximin (XIFAXAN) 550 MG TABS tablet, Take 550 mg by mouth 2 (two) times daily. (Patient not taking: Reported on 11/16/2021), Disp: , Rfl:    rifaximin (XIFAXAN) 550 MG TABS tablet, Take by mouth., Disp: , Rfl:    Semaglutide,0.25 or 0.5MG/DOS, 2 MG/1.5ML SOPN, Inject 0.5 mg into the skin once a week., Disp: , Rfl:    sucralfate (CARAFATE) 1 g tablet, Take 1 tablet (1 g total) by mouth 4 (four) times daily., Disp: 60 tablet, Rfl: 0   terazosin (HYTRIN) 5 MG capsule, Take 5 mg by mouth at bedtime. (Patient not taking: Reported on 11/16/2021), Disp: , Rfl:    terazosin (HYTRIN) 5 MG capsule, Take 1 capsule by mouth at bedtime., Disp: , Rfl:    vitamin C (ASCORBIC  ACID) 500 MG tablet, Take 500 mg by mouth daily. (Patient not taking: Reported on 11/16/2021), Disp: , Rfl:    vitamin E 180 MG (400 UNITS) capsule, Take 1 capsule by mouth at bedtime., Disp: , Rfl:   Past Medical History: Past Medical History:  Diagnosis Date   Arthritis    Bleeding ulcer    Cancer ([place])    melanoma, carcinoma   Cirrhosis of liver ([place])    Diabetes mellitus without complication ([place])    Heart murmur    Hypertension    Iron deficiency anemia    NASH (nonalcoholic steatohepatitis)    Sleep apnea    Spleen enlarged    Thrombocytopenia ([facility])     Tobacco Use: Social History   Tobacco Use  Smoking Status Former   Packs/day: 0.50   Years: 5.00   Pack years: 2.50   Types: Cigarettes   Quit date: 10/16/1968   Years since quitting: 53.1  Smokeless Tobacco Never    Labs: Recent Review Flowsheet Data     Labs for ITP Cardiac and Pulmonary Rehab Latest Ref Rng & Units 08/07/2018 07/19/2019 04/24/2021   Hemoglobin A1c 4.8 - 5.6 % - 6.1(H) 4.8   HCO3 20.0 - 28.0 mmol/L 26.4 - -   O2SAT % 70.7 - -        Exercise Target Goals: Exercise Program Goal: Individual exercise prescription set using results from initial 6 min walk test and THRR while considering  patients activity barriers and safety.    Exercise Prescription Goal: Initial exercise prescription builds to 30-45 minutes a day of aerobic activity, 2-3 days per week.  Home exercise guidelines will be given to patient during program as part of exercise prescription that the participant will acknowledge.   Education: Aerobic Exercise: - Group verbal and visual presentation on the components of exercise prescription. Introduces F.I.T.T principle from ACSM for exercise prescriptions.  Reviews F.I.T.T. principles of aerobic exercise including progression. Written material given at graduation. Flowsheet Row Cardiac Rehab from 12/04/2021 in [facility] Cardiac and Pulmonary Rehab  Education need identified 12/04/21       Education: Resistance Exercise: - Group verbal and visual presentation on the components of exercise prescription. Introduces F.I.T.T principle from ACSM for exercise prescriptions  Reviews F.I.T.T. principles of resistance exercise including progression. Written material given at graduation.    Education: Exercise & Equipment Safety: - Individual verbal instruction and demonstration of equipment use and safety with use of the equipment. Flowsheet Row Cardiac Rehab from 11/16/2021 in [facility] Cardiac and Pulmonary Rehab  Date 11/16/21  Educator [facility]  Instruction Review Code 1- Verbalizes Understanding       Education: Exercise Physiology & General Exercise Guidelines: - Group verbal and written instruction with models to review the exercise physiology of the cardiovascular system and associated critical values. Provides general exercise guidelines with specific guidelines to those with heart or lung disease.  Flowsheet Row Cardiac Rehab from 12/04/2021 in [facility] Cardiac and Pulmonary Rehab  Education need identified 12/04/21       Education: Flexibility, Balance, Mind/Body Relaxation: - Group verbal and visual presentation with interactive activity on the components of exercise prescription. Introduces F.I.T.T principle  from ACSM for exercise prescriptions. Reviews F.I.T.T. principles of flexibility and balance exercise training including progression. Also discusses the mind body connection.  Reviews various relaxation techniques to help reduce and manage stress (i.e. Deep breathing, progressive muscle relaxation, and visualization). Balance handout provided to take home. Written material given at graduation.   Activity Barriers &  Risk Stratification:  Activity Barriers & Cardiac Risk Stratification - 12/04/21 1030       Activity Barriers & Cardiac Risk Stratification   Activity Barriers Deconditioning;Muscular Weakness;Balance Concerns;Neck/Spine Problems;Assistive Device    Cardiac Risk Stratification Moderate             6 Minute Walk:  6 Minute Walk     Row Name 12/04/21 1035         6 Minute Walk   Phase Initial     Distance 605 feet     Walk Time 6 minutes     # of Rest Breaks 0     MPH 1.14     METS 0.94     RPE 11     Perceived Dyspnea  2     VO2 Peak 3.31     Symptoms Yes (comment)     Comments L knee pain 4/10, SOB, fatigue     Resting HR 62 bpm     Resting BP 104/58     Resting Oxygen Saturation  99 %     Exercise Oxygen Saturation  during 6 min walk 98 %     Max Ex. HR 90 bpm     Max Ex. BP 118/60     2 Minute Post BP 102/56              Oxygen Initial Assessment:   Oxygen Re-Evaluation:   Oxygen Discharge (Final Oxygen Re-Evaluation):   Initial Exercise Prescription:  Initial Exercise Prescription - 12/04/21 1000       Date of Initial Exercise RX and Referring Provider   Date 12/04/21    Referring Provider Lorelei Pont MD ([place])      Oxygen   Maintain Oxygen Saturation 88% or higher      Recumbant Bike   Level 1    RPM 60    Watts 15    Minutes 15    METs 1      NuStep   Level 1    SPM 80    Minutes 15    METs 1      REL-XR   Level 1    Speed 50    Minutes 15    METs 1      Track   Laps 10    Minutes 15    METs 1.54       Prescription Details   Frequency (times per week) 3    Duration Progress to 30 minutes of continuous aerobic without signs/symptoms of physical distress      Intensity   THRR 40-80% of Max Heartrate 94-127    Ratings of Perceived Exertion 11-13    Perceived Dyspnea 0-4      Progression   Progression Continue to progress workloads to maintain intensity without signs/symptoms of physical distress.      Resistance Training   Training Prescription Yes    Weight 3 lb    Reps 10-15             Perform Capillary Blood Glucose checks as needed.  Exercise Prescription Changes:   Exercise Prescription Changes     Row Name 12/04/21 1000             Response to Exercise   Blood Pressure (Admit) 104/58       Blood Pressure (Exercise) 118/60       Blood Pressure (Exit) 102/56       Heart Rate (Admit) 62 bpm  Heart Rate (Exercise) 90 bpm       Heart Rate (Exit) 69 bpm       Oxygen Saturation (Admit) 99 %       Oxygen Saturation (Exercise) 98 %       Rating of Perceived Exertion (Exercise) 11       Perceived Dyspnea (Exercise) 2       Symptoms Left knee pain 4/10, SOB, Fatigue       Comments walk test results                Exercise Comments:   Exercise Goals and Review:   Exercise Goals     Row Name 12/04/21 1043             Exercise Goals   Increase Physical Activity Yes       Intervention Provide advice, education, support and counseling about physical activity/exercise needs.;Develop an individualized exercise prescription for aerobic and resistive training based on initial evaluation findings, risk stratification, comorbidities and participant's personal goals.       Expected Outcomes Short Term: Attend rehab on a regular basis to increase amount of physical activity.;Long Term: Add in home exercise to make exercise part of routine and to increase amount of physical activity.;Long Term: Exercising regularly at least 3-5 days a week.       Increase  Strength and Stamina Yes       Intervention Develop an individualized exercise prescription for aerobic and resistive training based on initial evaluation findings, risk stratification, comorbidities and participant's personal goals.;Provide advice, education, support and counseling about physical activity/exercise needs.       Expected Outcomes Short Term: Increase workloads from initial exercise prescription for resistance, speed, and METs.;Short Term: Perform resistance training exercises routinely during rehab and add in resistance training at home;Long Term: Improve cardiorespiratory fitness, muscular endurance and strength as measured by increased METs and functional capacity (6MWT)       Able to understand and use rate of perceived exertion (RPE) scale Yes       Intervention Provide education and explanation on how to use RPE scale       Expected Outcomes Short Term: Able to use RPE daily in rehab to express subjective intensity level;Long Term:  Able to use RPE to guide intensity level when exercising independently       Able to understand and use Dyspnea scale Yes       Intervention Provide education and explanation on how to use Dyspnea scale       Expected Outcomes Short Term: Able to use Dyspnea scale daily in rehab to express subjective sense of shortness of breath during exertion;Long Term: Able to use Dyspnea scale to guide intensity level when exercising independently       Knowledge and understanding of Target Heart Rate Range (THRR) Yes       Intervention Provide education and explanation of THRR including how the numbers were predicted and where they are located for reference       Expected Outcomes Short Term: Able to state/look up THRR;Long Term: Able to use THRR to govern intensity when exercising independently;Short Term: Able to use daily as guideline for intensity in rehab       Able to check pulse independently Yes       Intervention Provide education and demonstration on how  to check pulse in carotid and radial arteries.;Review the importance of being able to check your own pulse for safety during independent exercise  Expected Outcomes Short Term: Able to explain why pulse checking is important during independent exercise;Long Term: Able to check pulse independently and accurately       Understanding of Exercise Prescription Yes       Intervention Provide education, explanation, and written materials on patient's individual exercise prescription       Expected Outcomes Short Term: Able to explain program exercise prescription;Long Term: Able to explain home exercise prescription to exercise independently                Exercise Goals Re-Evaluation :   Discharge Exercise Prescription (Final Exercise Prescription Changes):  Exercise Prescription Changes - 12/04/21 1000       Response to Exercise   Blood Pressure (Admit) 104/58    Blood Pressure (Exercise) 118/60    Blood Pressure (Exit) 102/56    Heart Rate (Admit) 62 bpm    Heart Rate (Exercise) 90 bpm    Heart Rate (Exit) 69 bpm    Oxygen Saturation (Admit) 99 %    Oxygen Saturation (Exercise) 98 %    Rating of Perceived Exertion (Exercise) 11    Perceived Dyspnea (Exercise) 2    Symptoms Left knee pain 4/10, SOB, Fatigue    Comments walk test results             Nutrition:  Target Goals: Understanding of nutrition guidelines, daily intake of sodium <1575m, cholesterol <209m calories 30% from fat and 7% or less from saturated fats, daily to have 5 or more servings of fruits and vegetables.  Education: All About Nutrition: -Group instruction provided by verbal, written material, interactive activities, discussions, models, and posters to present general guidelines for heart healthy nutrition including fat, fiber, MyPlate, the role of sodium in heart healthy nutrition, utilization of the nutrition label, and utilization of this knowledge for meal planning. Follow up email sent as well.  Written material given at graduation. [facility]  Education need identified 12/04/21       Biometrics:  Pre Biometrics - 12/04/21 1029       Pre Biometrics   Height 5' 11.75" (1.822 m)    Weight 234 lb 12.8 oz (106.5 kg)    BMI (Calculated) 32.08    Single Leg Stand 0 seconds              Nutrition Therapy Plan and Nutrition Goals:  Nutrition Therapy & Goals - 12/04/21 1018       Intervention Plan   Intervention Prescribe, educate and counsel regarding individualized specific dietary modifications aiming towards targeted core components such as weight, hypertension, lipid management, diabetes, heart failure and other comorbidities.    Expected Outcomes Short Term Goal: Understand basic principles of dietary content, such as calories, fat, sodium, cholesterol and nutrients.;Short Term Goal: A plan has been developed with personal nutrition goals set during dietitian appointment.;Long Term Goal: Adherence to prescribed nutrition plan.             Nutrition Assessments:  MEDIFICTS Score Key: ?70 Need to make dietary changes  40-70 Heart Healthy Diet ? 40 Therapeutic Level Cholesterol Diet  [facility]  Picture Your Plate Total Score on Admission 70      Picture Your Plate Scores: <4<09nhealthy dietary pattern with much room for improvement. 41-50 Dietary pattern unlikely to meet recommendations for good health and room for improvement. 51-60 More healthful dietary pattern, with some  room for improvement.  >60 Healthy dietary pattern, although there may be some specific behaviors that could be improved.    Nutrition Goals Re-Evaluation:   Nutrition Goals Discharge (Final Nutrition Goals Re-Evaluation):   Psychosocial: Target Goals: Acknowledge presence or absence of significant depression and/or stress, maximize coping  skills, provide positive support system. Participant is able to verbalize types and ability to use techniques and skills needed for reducing stress and depression.   Education: Stress, Anxiety, and Depression - Group verbal and visual presentation to define topics covered.  Reviews how body is impacted by stress, anxiety, and depression.  Also discusses healthy ways to reduce stress and to treat/manage anxiety and depression.  Written material given at graduation.   Education: Sleep Hygiene -Provides group verbal and written instruction about how sleep can affect your health.  Define sleep hygiene, discuss sleep cycles and impact of sleep habits. Review good sleep hygiene tips.    Initial Review & Psychosocial Screening:  Initial Psych Review & Screening - 11/16/21 1023       Initial Review   Current issues with Current Psychotropic Meds;Current Sleep Concerns      Family Dynamics   Good Support System? Yes    Comments Joshua Berry takes melatonin at night and is out right now so it has been hard for him to sleep. He can look to his wife for mental support, daughter, son and a health care aid.      Barriers   Psychosocial barriers to participate in program The patient should benefit from training in stress management and relaxation.;Psychosocial barriers identified (see note)      Screening Interventions   Interventions Encouraged to exercise;To provide support and resources with identified psychosocial needs;Provide feedback about the scores to participant    Expected Outcomes Short Term goal: Utilizing psychosocial counselor, staff and physician to assist with identification of specific Stressors or current issues interfering with healing process. Setting desired goal for each stressor or current issue identified.;Long Term Goal: Stressors or current issues are controlled or eliminated.;Short Term goal: Identification and review with participant of any Quality of Life or Depression concerns  found by scoring the questionnaire.;Long Term goal: The participant improves quality of Life and PHQ9 Scores as seen by post scores and/or verbalization of changes             Quality of Life Scores:   Quality of Life - 12/04/21 1029       Quality of Life   Select Quality of Life      Quality of Life Scores   Health/Function Pre 19 %    Socioeconomic Pre 22.5 %    Psych/Spiritual Pre 25.93 %    Family Pre 20 %    GLOBAL Pre 21.29 %            Scores of 19 and below usually indicate a poorer quality of life in these areas.  A difference of  2-3 points is a clinically meaningful difference.  A difference of 2-3 points in the total score of the Quality of Life Index has been associated with significant improvement in overall quality of life, self-image, physical symptoms, and general health in studies assessing change in quality of life.  PHQ-9: Recent Review Flowsheet Data     Depression screen [facility] 2/9 12/04/2021   Decreased Interest 2   Down, Depressed, Hopeless 1   PHQ - 2 Score 3   Altered sleeping 2   Tired, decreased energy 2   Change in appetite  1   Feeling bad or failure about yourself  0   Trouble concentrating 0   Moving slowly or fidgety/restless 1   Suicidal thoughts 0   PHQ-9 Score 9   Difficult doing work/chores Very difficult      Interpretation of Total Score  Total Score Depression Severity:  1-4 = Minimal depression, 5-9 = Mild depression, 10-14 = Moderate depression, 15-19 = Moderately severe depression, 20-27 = Severe depression   Psychosocial Evaluation and Intervention:  Psychosocial Evaluation - 11/16/21 1025       Psychosocial Evaluation & Interventions   Interventions Encouraged to exercise with the program and follow exercise prescription;Relaxation education;Stress management education    Comments Joshua Berry takes melatonin at night and is out right now so it has been hard for him to sleep. He can look to his wife for mental support,  daughter, son and a health care aid.    Expected Outcomes Short: Start HeartTrack to help with mood. Long: Maintain a healthy mental state    Continue Psychosocial Services  Follow up required by staff             Psychosocial Re-Evaluation:   Psychosocial Discharge (Final Psychosocial Re-Evaluation):   Vocational Rehabilitation: Provide vocational rehab assistance to qualifying candidates.   Vocational Rehab Evaluation & Intervention:   Education: Education Goals: Education classes will be provided on a variety of topics geared toward better understanding of heart health and risk factor modification. Participant will state understanding/return demonstration of topics presented as noted by education test scores.  Learning Barriers/Preferences:  Learning Barriers/Preferences - 11/16/21 1021       Learning Barriers/Preferences   Learning Barriers None    Learning Preferences None             General Cardiac Education Topics:  AED/CPR: - Group verbal and written instruction with the use of models to demonstrate the basic use of the AED with the basic ABC's of resuscitation.   Anatomy and Cardiac Procedures: - Group verbal and visual presentation and models provide information about basic cardiac anatomy and function. Reviews the testing methods done to diagnose heart disease and the outcomes of the test results. Describes the treatment choices: Medical Management, Angioplasty, or Coronary Bypass Surgery for treating various heart conditions including Myocardial Infarction, Angina, Valve Disease, and Cardiac Arrhythmias.  Written material given at graduation.   Medication Safety: - Group verbal and visual instruction to review commonly prescribed medications for heart and lung disease. Reviews the medication, class of the drug, and side effects. Includes the steps to properly store meds and maintain the prescription regimen.  Written material given at  graduation.   Intimacy: - Group verbal instruction through game format to discuss how heart and lung disease can affect sexual intimacy. Written material given at graduation..   Know Your Numbers and Heart Failure: - Group verbal and visual instruction to discuss disease risk factors for cardiac and pulmonary disease and treatment options.  Reviews associated critical values for Overweight/Obesity, Hypertension, Cholesterol, and Diabetes.  Discusses basics of heart failure: signs/symptoms and treatments.  Introduces Heart Failure Zone chart for action plan for heart failure.  Written material given at graduation.   Infection Prevention: - Provides verbal and written material to individual with discussion of infection control including proper hand washing and proper equipment cleaning during exercise session. Flowsheet Row Cardiac Rehab from 11/16/2021 in [facility] Cardiac and Pulmonary Rehab  Date 11/16/21  Educator [facility]  Instruction Review Code 1- Verbalizes Understanding  Falls Prevention: - Provides verbal and written material to individual with discussion of falls prevention and safety. Flowsheet Row Cardiac Rehab from 11/16/2021 in [facility] Cardiac and Pulmonary Rehab  Date 11/16/21  Educator [facility]  Instruction Review Code 1- Verbalizes Understanding       Other: -Provides group and verbal instruction on various topics (see comments)   Knowledge Questionnaire Score:  Knowledge Questionnaire Score - 12/04/21 1018       Knowledge Questionnaire Score   Pre Score 22/26: Exercise, Nutrition             Core Components/Risk Factors/Patient Goals at Admission:  Personal Goals and Risk Factors at Admission - 12/04/21 1043       Core Components/Risk Factors/Patient Goals on Admission    Weight Management Yes;Weight Loss   patient would like to maintain   Intervention Weight Management: Develop a combined nutrition and exercise program designed to reach desired caloric intake,  while maintaining appropriate intake of nutrient and fiber, sodium and fats, and appropriate energy expenditure required for the weight goal.;Weight Management/Obesity: Establish reasonable short term and long term weight goals.;Weight Management: Provide education and appropriate resources to help participant work on and attain dietary goals.    Admit Weight 234 lb (106.1 kg)    Goal Weight: Short Term 230 lb (104.3 kg)    Goal Weight: Long Term 227 lb (103 kg)    Expected Outcomes Short Term: Continue to assess and modify interventions until short term weight is achieved;Long Term: Adherence to nutrition and physical activity/exercise program aimed toward attainment of established weight goal;Weight Loss: Understanding of general recommendations for a balanced deficit meal plan, which promotes 1-2 lb weight loss per week and includes a negative energy balance of [phone] kcal/d;Understanding recommendations for meals to include 15-35% energy as protein, 25-35% energy from fat, 35-60% energy from carbohydrates, less than 229m of dietary cholesterol, 20-35 gm of total fiber daily;Understanding of distribution of calorie intake throughout the day with the consumption of 4-5 meals/snacks    Diabetes Yes    Intervention Provide education about signs/symptoms and action to take for hypo/hyperglycemia.;Provide education about proper nutrition, including hydration, and aerobic/resistive exercise prescription along with prescribed medications to achieve blood glucose in normal ranges: Fasting glucose 65-99 mg/dL    Expected Outcomes Short Term: Participant verbalizes understanding of the signs/symptoms and immediate care of hyper/hypoglycemia, proper foot care and importance of medication, aerobic/resistive exercise and nutrition plan for blood glucose control.;Long Term: Attainment of HbA1C < 7%.    Lipids Yes    Intervention Provide education and support for participant on nutrition & aerobic/resistive exercise  along with prescribed medications to achieve LDL <7103m HDL >4040m   Expected Outcomes Short Term: Participant states understanding of desired cholesterol values and is compliant with medications prescribed. Participant is following exercise prescription and nutrition guidelines.;Long Term: Cholesterol controlled with medications as prescribed, with individualized exercise RX and with personalized nutrition plan. Value goals: LDL < 5m26mDL > 40 mg.             Education:Diabetes - Individual verbal and written instruction to review signs/symptoms of diabetes, desired ranges of glucose level fasting, after meals and with exercise. Acknowledge that pre and post exercise glucose checks will be done for 3 sessions at entry of program. [facility]  Date 11/16/21  [facility] Code 1- Verbalizes Understanding       Core Components/Risk Factors/Patient Goals Review:  Core Components/Risk Factors/Patient Goals at Discharge (Final Review):    ITP Comments:  ITP Comments     Row Name 11/16/21 1028 12/04/21 1017         ITP Comments Virtual Visit completed. Patient informed on EP and RD appointment and 6 Minute walk test. Patient also informed of patient health questionnaires on My Chart. Patient Verbalizes understanding. Visit diagnosis can be found in [facility] 09/02/2021. Completed 6MWT and gym orientation. Initial ITP created and sent for review to Dr. Emily Filbert, Medical Director.               Comments: Initial ITP

## 2021-12-12 ENCOUNTER — Encounter: Payer: Self-pay | Admitting: *Deleted

## 2021-12-12 DIAGNOSIS — Z952 Presence of prosthetic heart valve: Secondary | ICD-10-CM

## 2021-12-12 NOTE — Progress Notes (Signed)
Cardiac Individual Treatment Plan  Patient Details  Name: ROMELO SCIANDRA MRN: [id number] Date of Birth: 11/03/1946 Referring Provider:   Flowsheet Row Cardiac Rehab from 12/04/2021 in [facility] Cardiac and Pulmonary Rehab  Referring Provider Lorelei Pont MD ([place])       Initial Encounter Date:  Flowsheet Row Cardiac Rehab from 12/04/2021 in [facility] Cardiac and Pulmonary Rehab  Date 12/04/21       Visit Diagnosis: S/P TAVR (transcatheter aortic valve replacement)  Patient's Home Medications on Admission:  Current Outpatient Medications:    albuterol (VENTOLIN HFA) 108 (90 Base) MCG/ACT inhaler, Inhale 1-2 puffs into the lungs every 6 (six) hours as needed for wheezing or shortness of breath. (Patient not taking: Reported on 11/16/2021), Disp: , Rfl:    albuterol (VENTOLIN HFA) 108 (90 Base) MCG/ACT inhaler, Inhale into the lungs., Disp: , Rfl:    amoxicillin (AMOXIL) 500 MG capsule, TAKE FOUR CAPSULES BY MOUTH AS DIRECTED BY YOUR PROVIDER 30 TO 60 MINUTES PRIOR TO PROCEDURES., Disp: , Rfl:    ascorbic acid (VITAMIN C) 500 MG tablet, Take 2 tablets by mouth daily., Disp: , Rfl:    atorvastatin (LIPITOR) 10 MG tablet, Take 20 mg by mouth daily., Disp: , Rfl:    augmented betamethasone dipropionate (DIPROLENE-AF) 0.05 % cream, Apply 1 application topically 2 (two) times daily., Disp: , Rfl:    carboxymethylcellulose (REFRESH PLUS) 0.5 % SOLN, 1 drop at bedtime., Disp: , Rfl:    Carboxymethylcellulose Sodium 1 % GEL, APPLY 1 DROP TO EACH EYE AT BEDTIME (Patient not taking: Reported on 11/16/2021), Disp: , Rfl:    cholecalciferol (VITAMIN D) 25 MCG (1000 UT) tablet, Take 1,000 Units by mouth 2 (two) times daily.  (Patient not taking: Reported on 11/16/2021), Disp: , Rfl:    Cholecalciferol 50 MCG (2000 UT) TABS, Take 1 tablet by mouth daily., Disp: , Rfl:    famotidine (PEPCID) 40 MG tablet, Take by mouth., Disp: , Rfl:    ferrous sulfate 325 (65 FE) MG tablet, Take 325 mg by mouth daily with  breakfast. (Patient not taking: Reported on 11/16/2021), Disp: , Rfl:    fluorouracil (EFUDEX) 5 % cream, APPLY AS DIRECTED TO AFFECTED AREA AS DIRECTED START OCT OR JAN. APPLY 4-6 WEEKS AS TOLERATED; [place], PAT DRY, DO NOT RUB; TREAT: SCALP/FACE/EARS/NECK/ARMS; DO NOT APPLY NEAR EYES, NOSTRILS, MOUTH; [place] HANDS AFTERWARDS; APPLY MOISTURIZER EVERY 2 HOURS WHEN AWAKE; AVOID SUN. SEE HANDOUT, Disp: , Rfl:    fluticasone (FLONASE) 50 MCG/ACT nasal spray, Place 1 spray into both nostrils daily., Disp: , Rfl:    folic acid (FOLVITE) 1 MG tablet, Take 1 mg by mouth at bedtime. (Patient not taking: Reported on 11/16/2021), Disp: , Rfl:    folic acid (FOLVITE) 1 MG tablet, Take 1 tablet by mouth daily., Disp: , Rfl:    furosemide (LASIX) 20 MG tablet, Take by mouth., Disp: , Rfl:    furosemide (LASIX) 40 MG tablet, Take 1 tablet (40 mg total) by mouth daily., Disp: 30 tablet, Rfl: 0   Glycerin-Hypromellose-PEG 400 0.2-0.2-1 % SOLN, Apply 1 drop to eye 4 (four) times daily., Disp: , Rfl:    insulin aspart (NOVOLOG) 100 UNIT/ML FlexPen, Inject into the skin., Disp: , Rfl:    insulin aspart (NOVOLOG) 100 UNIT/ML injection, Inject 10 Units into the skin 3 (three) times daily before meals. (Patient not taking: Reported on 11/16/2021), Disp: 10 mL, Rfl: 11   insulin glargine (LANTUS) 100 UNIT/ML injection, Inject 0.16 mLs (16 Units total) into the  skin daily. (Patient not taking: Reported on 11/16/2021), Disp: 10 mL, Rfl: 11   insulin glargine (LANTUS) 100 UNIT/ML Solostar Pen, Inject into the skin., Disp: , Rfl:    insulin glargine-yfgn (SEMGLEE) 100 UNIT/ML Pen, INJECT 14 UNITS SUBCUTANEOUSLY DAILY (USE WITHIN 28 DAYS AFTER OPENING PEN) (Patient not taking: Reported on 11/16/2021), Disp: , Rfl:    ipratropium-albuterol (DUONEB) 0.5-2.5 (3) MG/3ML SOLN, Take 3 mLs by nebulization every 4 (four) hours as needed (shortness of breath).  (Patient not taking: Reported on 11/16/2021), Disp: , Rfl:    ketotifen (ZADITOR) 0.025  % ophthalmic solution, Place 1 drop into both eyes 2 (two) times daily. (Patient not taking: Reported on 11/16/2021), Disp: , Rfl:    ketotifen (ZADITOR) 0.025 % ophthalmic solution, Place 1 drop into both eyes as needed (for allergies)., Disp: , Rfl:    Lactobacillus Rhamnosus, GG, (RA PROBIOTIC DIGESTIVE CARE) CAPS, Take 1 capsule by mouth daily., Disp: , Rfl:    lactulose (CHRONULAC) 10 GM/15ML solution, Take 10 g by mouth 3 (three) times daily. (Patient not taking: Reported on 11/16/2021), Disp: , Rfl:    lactulose (CHRONULAC) 10 GM/15ML solution, Take by mouth., Disp: , Rfl:    levothyroxine (SYNTHROID) 100 MCG tablet, TAKE ONE TABLET BY MOUTH DAILY ON AN EMPTY STOMACH, Disp: , Rfl:    levothyroxine (SYNTHROID, LEVOTHROID) 100 MCG tablet, Take 100 mcg by mouth daily before breakfast. (Patient not taking: Reported on 11/16/2021), Disp: , Rfl:    Lidocaine 4 % PTCH, Place onto the skin., Disp: , Rfl:    MAGNESIUM GLUCONATE PO, Take 420 mg by mouth daily. (Patient not taking: Reported on 11/16/2021), Disp: , Rfl:    Magnesium Oxide 420 MG TABS, Take 2 tablets by mouth 2 (two) times daily., Disp: , Rfl:    octreotide (SANDOSTATIN LAR) 20 MG injection, INJECT 1 VIAL (20MG) INTRAMUSCULARLY EVERY 28 DAYS (FOR INFUSION CLINIC), Disp: , Rfl:    Omega-3 Fatty Acids (FISH OIL) 1000 MG CAPS, Take 1 capsule by mouth 2 (two) times daily., Disp: , Rfl:    pantoprazole (PROTONIX) 40 MG tablet, Take 1 tablet (40 mg total) by mouth 2 (two) times daily., Disp: 30 tablet, Rfl: 3   pantoprazole (PROTONIX) 40 MG tablet, Take 1 tablet (40 mg total) by mouth 2 (two) times daily., Disp: 180 tablet, Rfl: 3   pantoprazole (PROTONIX) 40 MG tablet, Take 1 tablet (40 mg total) by mouth daily for 14 days., Disp: 14 tablet, Rfl: 0   Polyethyl Glycol-Propyl Glycol 0.4-0.3 % SOLN, INSTILL 1 DROP IN EACH EYE FOUR TIMES A DAY, Disp: , Rfl:    Probiotic Product ([employer] ADV PROBIOTIC GUMMIES PO), Take 1 each by mouth daily., Disp: , Rfl:     rifaximin (XIFAXAN) 550 MG TABS tablet, Take 550 mg by mouth 2 (two) times daily. (Patient not taking: Reported on 11/16/2021), Disp: , Rfl:    rifaximin (XIFAXAN) 550 MG TABS tablet, Take by mouth., Disp: , Rfl:    Semaglutide,0.25 or 0.5MG/DOS, 2 MG/1.5ML SOPN, Inject 0.5 mg into the skin once a week., Disp: , Rfl:    sucralfate (CARAFATE) 1 g tablet, Take 1 tablet (1 g total) by mouth 4 (four) times daily., Disp: 60 tablet, Rfl: 0   terazosin (HYTRIN) 5 MG capsule, Take 5 mg by mouth at bedtime. (Patient not taking: Reported on 11/16/2021), Disp: , Rfl:    terazosin (HYTRIN) 5 MG capsule, Take 1 capsule by mouth at bedtime., Disp: , Rfl:    vitamin C (ASCORBIC  ACID) 500 MG tablet, Take 500 mg by mouth daily. (Patient not taking: Reported on 11/16/2021), Disp: , Rfl:    vitamin E 180 MG (400 UNITS) capsule, Take 1 capsule by mouth at bedtime., Disp: , Rfl:   Past Medical History: Past Medical History:  Diagnosis Date   Arthritis    Bleeding ulcer    Cancer ([place])    melanoma, carcinoma   Cirrhosis of liver ([place])    Diabetes mellitus without complication ([place])    Heart murmur    Hypertension    Iron deficiency anemia    NASH (nonalcoholic steatohepatitis)    Sleep apnea    Spleen enlarged    Thrombocytopenia ([facility])     Tobacco Use: Social History   Tobacco Use  Smoking Status Former   Packs/day: 0.50   Years: 5.00   Pack years: 2.50   Types: Cigarettes   Quit date: 10/16/1968   Years since quitting: 53.1  Smokeless Tobacco Never    Labs: Recent Review Flowsheet Data     Labs for ITP Cardiac and Pulmonary Rehab Latest Ref Rng & Units 08/07/2018 07/19/2019 04/24/2021   Hemoglobin A1c 4.8 - 5.6 % - 6.1(H) 4.8   HCO3 20.0 - 28.0 mmol/L 26.4 - -   O2SAT % 70.7 - -        Exercise Target Goals: Exercise Program Goal: Individual exercise prescription set using results from initial 6 min walk test and THRR while considering  patients activity barriers and safety.    Exercise Prescription Goal: Initial exercise prescription builds to 30-45 minutes a day of aerobic activity, 2-3 days per week.  Home exercise guidelines will be given to patient during program as part of exercise prescription that the participant will acknowledge.   Education: Aerobic Exercise: - Group verbal and visual presentation on the components of exercise prescription. Introduces F.I.T.T principle from ACSM for exercise prescriptions.  Reviews F.I.T.T. principles of aerobic exercise including progression. Written material given at graduation. Flowsheet Row Cardiac Rehab from 12/04/2021 in [facility] Cardiac and Pulmonary Rehab  Education need identified 12/04/21       Education: Resistance Exercise: - Group verbal and visual presentation on the components of exercise prescription. Introduces F.I.T.T principle from ACSM for exercise prescriptions  Reviews F.I.T.T. principles of resistance exercise including progression. Written material given at graduation.    Education: Exercise & Equipment Safety: - Individual verbal instruction and demonstration of equipment use and safety with use of the equipment. Flowsheet Row Cardiac Rehab from 11/16/2021 in [facility] Cardiac and Pulmonary Rehab  Date 11/16/21  Educator [place]  Instruction Review Code 1- Verbalizes Understanding       Education: Exercise Physiology & General Exercise Guidelines: - Group verbal and written instruction with models to review the exercise physiology of the cardiovascular system and associated critical values. Provides general exercise guidelines with specific guidelines to those with heart or lung disease.  Flowsheet Row Cardiac Rehab from 12/04/2021 in [facility] Cardiac and Pulmonary Rehab  Education need identified 12/04/21       Education: Flexibility, Balance, Mind/Body Relaxation: - Group verbal and visual presentation with interactive activity on the components of exercise prescription. Introduces F.I.T.T principle  from ACSM for exercise prescriptions. Reviews F.I.T.T. principles of flexibility and balance exercise training including progression. Also discusses the mind body connection.  Reviews various relaxation techniques to help reduce and manage stress (i.e. Deep breathing, progressive muscle relaxation, and visualization). Balance handout provided to take home. Written material given at graduation.   Activity Barriers &  Risk Stratification:  Activity Barriers & Cardiac Risk Stratification - 12/04/21 1030       Activity Barriers & Cardiac Risk Stratification   Activity Barriers Deconditioning;Muscular Weakness;Balance Concerns;Neck/Spine Problems;Assistive Device    Cardiac Risk Stratification Moderate             6 Minute Walk:  6 Minute Walk     Row Name 12/04/21 1035         6 Minute Walk   Phase Initial     Distance 605 feet     Walk Time 6 minutes     # of Rest Breaks 0     MPH 1.14     METS 0.94     RPE 11     Perceived Dyspnea  2     VO2 Peak 3.31     Symptoms Yes (comment)     Comments L knee pain 4/10, SOB, fatigue     Resting HR 62 bpm     Resting BP 104/58     Resting Oxygen Saturation  99 %     Exercise Oxygen Saturation  during 6 min walk 98 %     Max Ex. HR 90 bpm     Max Ex. BP 118/60     2 Minute Post BP 102/56              Oxygen Initial Assessment:   Oxygen Re-Evaluation:   Oxygen Discharge (Final Oxygen Re-Evaluation):   Initial Exercise Prescription:  Initial Exercise Prescription - 12/04/21 1000       Date of Initial Exercise RX and Referring Provider   Date 12/04/21    Referring Provider Lorelei Pont MD ([place])      Oxygen   Maintain Oxygen Saturation 88% or higher      Recumbant Bike   Level 1    RPM 60    Watts 15    Minutes 15    METs 1      NuStep   Level 1    SPM 80    Minutes 15    METs 1      REL-XR   Level 1    Speed 50    Minutes 15    METs 1      Track   Laps 10    Minutes 15    METs 1.54       Prescription Details   Frequency (times per week) 3    Duration Progress to 30 minutes of continuous aerobic without signs/symptoms of physical distress      Intensity   THRR 40-80% of Max Heartrate 94-127    Ratings of Perceived Exertion 11-13    Perceived Dyspnea 0-4      Progression   Progression Continue to progress workloads to maintain intensity without signs/symptoms of physical distress.      Resistance Training   Training Prescription Yes    Weight 3 lb    Reps 10-15             Perform Capillary Blood Glucose checks as needed.  Exercise Prescription Changes:   Exercise Prescription Changes     Row Name 12/04/21 1000             Response to Exercise   Blood Pressure (Admit) 104/58       Blood Pressure (Exercise) 118/60       Blood Pressure (Exit) 102/56       Heart Rate (Admit) 62 bpm  Heart Rate (Exercise) 90 bpm       Heart Rate (Exit) 69 bpm       Oxygen Saturation (Admit) 99 %       Oxygen Saturation (Exercise) 98 %       Rating of Perceived Exertion (Exercise) 11       Perceived Dyspnea (Exercise) 2       Symptoms Left knee pain 4/10, SOB, Fatigue       Comments walk test results                Exercise Comments:   Exercise Goals and Review:   Exercise Goals     Row Name 12/04/21 1043             Exercise Goals   Increase Physical Activity Yes       Intervention Provide advice, education, support and counseling about physical activity/exercise needs.;Develop an individualized exercise prescription for aerobic and resistive training based on initial evaluation findings, risk stratification, comorbidities and participant's personal goals.       Expected Outcomes Short Term: Attend rehab on a regular basis to increase amount of physical activity.;Long Term: Add in home exercise to make exercise part of routine and to increase amount of physical activity.;Long Term: Exercising regularly at least 3-5 days a week.       Increase  Strength and Stamina Yes       Intervention Develop an individualized exercise prescription for aerobic and resistive training based on initial evaluation findings, risk stratification, comorbidities and participant's personal goals.;Provide advice, education, support and counseling about physical activity/exercise needs.       Expected Outcomes Short Term: Increase workloads from initial exercise prescription for resistance, speed, and METs.;Short Term: Perform resistance training exercises routinely during rehab and add in resistance training at home;Long Term: Improve cardiorespiratory fitness, muscular endurance and strength as measured by increased METs and functional capacity (6MWT)       Able to understand and use rate of perceived exertion (RPE) scale Yes       Intervention Provide education and explanation on how to use RPE scale       Expected Outcomes Short Term: Able to use RPE daily in rehab to express subjective intensity level;Long Term:  Able to use RPE to guide intensity level when exercising independently       Able to understand and use Dyspnea scale Yes       Intervention Provide education and explanation on how to use Dyspnea scale       Expected Outcomes Short Term: Able to use Dyspnea scale daily in rehab to express subjective sense of shortness of breath during exertion;Long Term: Able to use Dyspnea scale to guide intensity level when exercising independently       Knowledge and understanding of Target Heart Rate Range (THRR) Yes       Intervention Provide education and explanation of THRR including how the numbers were predicted and where they are located for reference       Expected Outcomes Short Term: Able to state/look up THRR;Long Term: Able to use THRR to govern intensity when exercising independently;Short Term: Able to use daily as guideline for intensity in rehab       Able to check pulse independently Yes       Intervention Provide education and demonstration on how  to check pulse in carotid and radial arteries.;Review the importance of being able to check your own pulse for safety during independent exercise  Expected Outcomes Short Term: Able to explain why pulse checking is important during independent exercise;Long Term: Able to check pulse independently and accurately       Understanding of Exercise Prescription Yes       Intervention Provide education, explanation, and written materials on patient's individual exercise prescription       Expected Outcomes Short Term: Able to explain program exercise prescription;Long Term: Able to explain home exercise prescription to exercise independently                Exercise Goals Re-Evaluation :   Discharge Exercise Prescription (Final Exercise Prescription Changes):  Exercise Prescription Changes - 12/04/21 1000       Response to Exercise   Blood Pressure (Admit) 104/58    Blood Pressure (Exercise) 118/60    Blood Pressure (Exit) 102/56    Heart Rate (Admit) 62 bpm    Heart Rate (Exercise) 90 bpm    Heart Rate (Exit) 69 bpm    Oxygen Saturation (Admit) 99 %    Oxygen Saturation (Exercise) 98 %    Rating of Perceived Exertion (Exercise) 11    Perceived Dyspnea (Exercise) 2    Symptoms Left knee pain 4/10, SOB, Fatigue    Comments walk test results             Nutrition:  Target Goals: Understanding of nutrition guidelines, daily intake of sodium <1521m, cholesterol <2065m calories 30% from fat and 7% or less from saturated fats, daily to have 5 or more servings of fruits and vegetables.  Education: All About Nutrition: -Group instruction provided by verbal, written material, interactive activities, discussions, models, and posters to present general guidelines for heart healthy nutrition including fat, fiber, MyPlate, the role of sodium in heart healthy nutrition, utilization of the nutrition label, and utilization of this knowledge for meal planning. Follow up email sent as well.  Written material given at graduation. Flow[facility]  Education need identified 12/04/21       Biometrics:  Pre Biometrics - 12/04/21 1029       Pre Biometrics   Height 5' 11.75" (1.822 m)    Weight 234 lb 12.8 oz (106.5 kg)    BMI (Calculated) 32.08    Single Leg Stand 0 seconds              Nutrition Therapy Plan and Nutrition Goals:  Nutrition Therapy & Goals - 12/04/21 1018       Intervention Plan   Intervention Prescribe, educate and counsel regarding individualized specific dietary modifications aiming towards targeted core components such as weight, hypertension, lipid management, diabetes, heart failure and other comorbidities.    Expected Outcomes Short Term Goal: Understand basic principles of dietary content, such as calories, fat, sodium, cholesterol and nutrients.;Short Term Goal: A plan has been developed with personal nutrition goals set during dietitian appointment.;Long Term Goal: Adherence to prescribed nutrition plan.             Nutrition Assessments:  MEDIFICTS Score Key: ?70 Need to make dietary changes  40-70 Heart Healthy Diet ? 40 Therapeutic Level Cholesterol Diet  [facility]  Picture Your Plate Total Score on Admission 70      Picture Your Plate Scores: <4<74nhealthy dietary pattern with much room for improvement. 41-50 Dietary pattern unlikely to meet recommendations for good health and room for improvement. 51-60 More healthful dietary pattern, with some  room for improvement.  >60 Healthy dietary pattern, although there may be some specific behaviors that could be improved.    Nutrition Goals Re-Evaluation:   Nutrition Goals Discharge (Final Nutrition Goals Re-Evaluation):   Psychosocial: Target Goals: Acknowledge presence or absence of significant depression and/or stress, maximize coping  skills, provide positive support system. Participant is able to verbalize types and ability to use techniques and skills needed for reducing stress and depression.   Education: Stress, Anxiety, and Depression - Group verbal and visual presentation to define topics covered.  Reviews how body is impacted by stress, anxiety, and depression.  Also discusses healthy ways to reduce stress and to treat/manage anxiety and depression.  Written material given at graduation.   Education: Sleep Hygiene -Provides group verbal and written instruction about how sleep can affect your health.  Define sleep hygiene, discuss sleep cycles and impact of sleep habits. Review good sleep hygiene tips.    Initial Review & Psychosocial Screening:  Initial Psych Review & Screening - 11/16/21 1023       Initial Review   Current issues with Current Psychotropic Meds;Current Sleep Concerns      Family Dynamics   Good Support System? Yes    Comments Demarcus takes melatonin at night and is out right now so it has been hard for him to sleep. He can look to his wife for mental support, daughter, son and a health care aid.      Barriers   Psychosocial barriers to participate in program The patient should benefit from training in stress management and relaxation.;Psychosocial barriers identified (see note)      Screening Interventions   Interventions Encouraged to exercise;To provide support and resources with identified psychosocial needs;Provide feedback about the scores to participant    Expected Outcomes Short Term goal: Utilizing psychosocial counselor, staff and physician to assist with identification of specific Stressors or current issues interfering with healing process. Setting desired goal for each stressor or current issue identified.;Long Term Goal: Stressors or current issues are controlled or eliminated.;Short Term goal: Identification and review with participant of any Quality of Life or Depression concerns  found by scoring the questionnaire.;Long Term goal: The participant improves quality of Life and PHQ9 Scores as seen by post scores and/or verbalization of changes             Quality of Life Scores:   Quality of Life - 12/04/21 1029       Quality of Life   Select Quality of Life      Quality of Life Scores   Health/Function Pre 19 %    Socioeconomic Pre 22.5 %    Psych/Spiritual Pre 25.93 %    Family Pre 20 %    GLOBAL Pre 21.29 %            Scores of 19 and below usually indicate a poorer quality of life in these areas.  A difference of  2-3 points is a clinically meaningful difference.  A difference of 2-3 points in the total score of the Quality of Life Index has been associated with significant improvement in overall quality of life, self-image, physical symptoms, and general health in studies assessing change in quality of life.  PHQ-9: Recent Review Flowsheet Data     Depression screen [facility] 2/9 12/04/2021   Decreased Interest 2   Down, Depressed, Hopeless 1   PHQ - 2 Score 3   Altered sleeping 2   Tired, decreased energy 2   Change in appetite  1   Feeling bad or failure about yourself  0   Trouble concentrating 0   Moving slowly or fidgety/restless 1   Suicidal thoughts 0   PHQ-9 Score 9   Difficult doing work/chores Very difficult      Interpretation of Total Score  Total Score Depression Severity:  1-4 = Minimal depression, 5-9 = Mild depression, 10-14 = Moderate depression, 15-19 = Moderately severe depression, 20-27 = Severe depression   Psychosocial Evaluation and Intervention:  Psychosocial Evaluation - 11/16/21 1025       Psychosocial Evaluation & Interventions   Interventions Encouraged to exercise with the program and follow exercise prescription;Relaxation education;Stress management education    Comments Mainor takes melatonin at night and is out right now so it has been hard for him to sleep. He can look to his wife for mental support,  daughter, son and a health care aid.    Expected Outcomes Short: Start HeartTrack to help with mood. Long: Maintain a healthy mental state    Continue Psychosocial Services  Follow up required by staff             Psychosocial Re-Evaluation:   Psychosocial Discharge (Final Psychosocial Re-Evaluation):   Vocational Rehabilitation: Provide vocational rehab assistance to qualifying candidates.   Vocational Rehab Evaluation & Intervention:   Education: Education Goals: Education classes will be provided on a variety of topics geared toward better understanding of heart health and risk factor modification. Participant will state understanding/return demonstration of topics presented as noted by education test scores.  Learning Barriers/Preferences:  Learning Barriers/Preferences - 11/16/21 1021       Learning Barriers/Preferences   Learning Barriers None    Learning Preferences None             General Cardiac Education Topics:  AED/CPR: - Group verbal and written instruction with the use of models to demonstrate the basic use of the AED with the basic ABC's of resuscitation.   Anatomy and Cardiac Procedures: - Group verbal and visual presentation and models provide information about basic cardiac anatomy and function. Reviews the testing methods done to diagnose heart disease and the outcomes of the test results. Describes the treatment choices: Medical Management, Angioplasty, or Coronary Bypass Surgery for treating various heart conditions including Myocardial Infarction, Angina, Valve Disease, and Cardiac Arrhythmias.  Written material given at graduation.   Medication Safety: - Group verbal and visual instruction to review commonly prescribed medications for heart and lung disease. Reviews the medication, class of the drug, and side effects. Includes the steps to properly store meds and maintain the prescription regimen.  Written material given at  graduation.   Intimacy: - Group verbal instruction through game format to discuss how heart and lung disease can affect sexual intimacy. Written material given at graduation..   Know Your Numbers and Heart Failure: - Group verbal and visual instruction to discuss disease risk factors for cardiac and pulmonary disease and treatment options.  Reviews associated critical values for Overweight/Obesity, Hypertension, Cholesterol, and Diabetes.  Discusses basics of heart failure: signs/symptoms and treatments.  Introduces Heart Failure Zone chart for action plan for heart failure.  Written material given at graduation.   Infection Prevention: - Provides verbal and written material to individual with discussion of infection control including proper hand washing and proper equipment cleaning during exercise session. Flowsheet Row Cardiac Rehab from 11/16/2021 in [facility] Cardiac and Pulmonary Rehab  Date 11/16/21  Educator [facility]  Instruction Review Code 1- Verbalizes Understanding  Falls Prevention: - Provides verbal and written material to individual with discussion of falls prevention and safety. Flowsheet Row Cardiac Rehab from 11/16/2021 in [facility] Cardiac and Pulmonary Rehab  Date 11/16/21  Educator [facility]  Instruction Review Code 1- Verbalizes Understanding       Other: -Provides group and verbal instruction on various topics (see comments)   Knowledge Questionnaire Score:  Knowledge Questionnaire Score - 12/04/21 1018       Knowledge Questionnaire Score   Pre Score 22/26: Exercise, Nutrition             Core Components/Risk Factors/Patient Goals at Admission:  Personal Goals and Risk Factors at Admission - 12/04/21 1043       Core Components/Risk Factors/Patient Goals on Admission    Weight Management Yes;Weight Loss   patient would like to maintain   Intervention Weight Management: Develop a combined nutrition and exercise program designed to reach desired caloric intake,  while maintaining appropriate intake of nutrient and fiber, sodium and fats, and appropriate energy expenditure required for the weight goal.;Weight Management/Obesity: Establish reasonable short term and long term weight goals.;Weight Management: Provide education and appropriate resources to help participant work on and attain dietary goals.    Admit Weight 234 lb (106.1 kg)    Goal Weight: Short Term 230 lb (104.3 kg)    Goal Weight: Long Term 227 lb (103 kg)    Expected Outcomes Short Term: Continue to assess and modify interventions until short term weight is achieved;Long Term: Adherence to nutrition and physical activity/exercise program aimed toward attainment of established weight goal;Weight Loss: Understanding of general recommendations for a balanced deficit meal plan, which promotes 1-2 lb weight loss per week and includes a negative energy balance of [phone] kcal/d;Understanding recommendations for meals to include 15-35% energy as protein, 25-35% energy from fat, 35-60% energy from carbohydrates, less than 235m of dietary cholesterol, 20-35 gm of total fiber daily;Understanding of distribution of calorie intake throughout the day with the consumption of 4-5 meals/snacks    Diabetes Yes    Intervention Provide education about signs/symptoms and action to take for hypo/hyperglycemia.;Provide education about proper nutrition, including hydration, and aerobic/resistive exercise prescription along with prescribed medications to achieve blood glucose in normal ranges: Fasting glucose 65-99 mg/dL    Expected Outcomes Short Term: Participant verbalizes understanding of the signs/symptoms and immediate care of hyper/hypoglycemia, proper foot care and importance of medication, aerobic/resistive exercise and nutrition plan for blood glucose control.;Long Term: Attainment of HbA1C < 7%.    Lipids Yes    Intervention Provide education and support for participant on nutrition & aerobic/resistive exercise  along with prescribed medications to achieve LDL <751m HDL >4033m   Expected Outcomes Short Term: Participant states understanding of desired cholesterol values and is compliant with medications prescribed. Participant is following exercise prescription and nutrition guidelines.;Long Term: Cholesterol controlled with medications as prescribed, with individualized exercise RX and with personalized nutrition plan. Value goals: LDL < 34m54mDL > 40 mg.             Education:Diabetes - Individual verbal and written instruction to review signs/symptoms of diabetes, desired ranges of glucose level fasting, after meals and with exercise. Acknowledge that pre and post exercise glucose checks will be done for 3 sessions at entry of program. [place] [facility]  Date 11/16/21  Educator [facility] Code 1- Verbalizes Understanding       Core Components/Risk Factors/Patient Goals Review:  Core Components/Risk Factors/Patient Goals at Discharge (Final Review):    ITP Comments:  ITP Comments     Row Name 11/16/21 1028 12/04/21 1017 12/12/21 1114       ITP Comments Virtual Visit completed. Patient informed on EP and RD appointment and 6 Minute walk test. Patient also informed of patient health questionnaires on My Chart. Patient Verbalizes understanding. Visit diagnosis can be found in [facility] 09/02/2021. Completed 6MWT and gym orientation. Initial ITP created and sent for review to Dr. Emily Filbert, Medical Director. 30 Day review completed. Medical Director ITP review done, changes made as directed, and signed approval by Medical Director.    New to program              Comments:

## 2021-12-19 ENCOUNTER — Encounter: Payer: No Typology Code available for payment source | Attending: Internal Medicine

## 2021-12-19 ENCOUNTER — Other Ambulatory Visit: Payer: Self-pay

## 2021-12-19 DIAGNOSIS — Z48812 Encounter for surgical aftercare following surgery on the circulatory system: Secondary | ICD-10-CM | POA: Insufficient documentation

## 2021-12-19 DIAGNOSIS — Z952 Presence of prosthetic heart valve: Secondary | ICD-10-CM

## 2021-12-19 DIAGNOSIS — E119 Type 2 diabetes mellitus without complications: Secondary | ICD-10-CM | POA: Diagnosis not present

## 2021-12-19 LAB — GLUCOSE, CAPILLARY
Glucose-Capillary: 184 mg/dL — ABNORMAL HIGH (ref 70–99)
Glucose-Capillary: 145 mg/dL — ABNORMAL HIGH (ref 70–99)

## 2021-12-19 NOTE — Progress Notes (Signed)
Daily Session Note  Patient Details  Name: Joshua Berry MRN: [id number] Date of Birth: Oct 15, 1946 Referring Provider:   Flowsheet Row Cardiac Rehab from 12/04/2021 in [facility] Cardiac and Pulmonary Rehab  Referring Provider Lorelei Pont MD ([place])       Encounter Date: 12/19/2021  Check In:  Session Check In - 12/19/21 1349       Check-In   Supervising physician immediately available to respond to emergencies See telemetry face sheet for immediately available ER MD    Location [facility]-Cardiac & Pulmonary Rehab    Staff Present Birdie Sons, MPA, RN;Joseph Lou Miner, MS, ASCM CEP, Exercise Physiologist    Virtual Visit No    Medication changes reported     No    Fall or balance concerns reported    No    Warm-up and Cool-down Performed on first and last piece of equipment    Resistance Training Performed Yes    VAD Patient? No    PAD/SET Patient? No      Pain Assessment   Currently in Pain? No/denies                Social History   Tobacco Use  Smoking Status Former   Packs/day: 0.50   Years: 5.00   Pack years: 2.50   Types: Cigarettes   Quit date: 10/16/1968   Years since quitting: 53.2  Smokeless Tobacco Never    Goals Met:  Independence with exercise equipment Exercise tolerated well No report of concerns or symptoms today Strength training completed today  Goals Unmet:  Not Applicable  Comments: First full day of exercise!  Patient was oriented to gym and equipment including functions, settings, policies, and procedures.  Patient's individual exercise prescription and treatment plan were reviewed.  All starting workloads were established based on the results of the 6 minute walk test done at initial orientation visit.  The plan for exercise progression was also introduced and progression will be customized based on patient's performance and goals.    Dr. Emily Filbert is Medical Director for [place].  Dr.  Ottie Glazier is Medical Director for [facility] Pulmonary Rehabilitation.

## 2021-12-20 ENCOUNTER — Other Ambulatory Visit: Payer: Self-pay

## 2021-12-20 ENCOUNTER — Encounter: Payer: No Typology Code available for payment source | Admitting: *Deleted

## 2021-12-20 DIAGNOSIS — Z952 Presence of prosthetic heart valve: Secondary | ICD-10-CM

## 2021-12-20 DIAGNOSIS — Z48812 Encounter for surgical aftercare following surgery on the circulatory system: Secondary | ICD-10-CM | POA: Diagnosis not present

## 2021-12-20 LAB — GLUCOSE, CAPILLARY: Glucose-Capillary: 169 mg/dL — ABNORMAL HIGH (ref 70–99)

## 2021-12-20 NOTE — Progress Notes (Signed)
Daily Session Note  Patient Details  Name: Joshua Berry MRN: [id number] Date of Birth: 24-Sep-1946 Referring Provider:   Flowsheet Row Cardiac Rehab from 12/04/2021 in [facility] Cardiac and Pulmonary Rehab  Referring Provider Lorelei Pont MD ([place])       Encounter Date: 12/20/2021  Check In:  Session Check In - 12/20/21 1402       Check-In   Supervising physician immediately available to respond to emergencies See telemetry face sheet for immediately available ER MD    Location [facility]-Cardiac & Pulmonary Rehab    Staff Present Renita Papa, RN BSN;Joseph [place], RCP,RRT,BSRT;Jessica [place], [place], RCEP, CCRP, CCET    Virtual Visit No    Medication changes reported     No    Fall or balance concerns reported    No    Warm-up and Cool-down Performed on first and last piece of equipment    Resistance Training Performed Yes    VAD Patient? No    PAD/SET Patient? No      Pain Assessment   Currently in Pain? No/denies                Social History   Tobacco Use  Smoking Status Former   Packs/day: 0.50   Years: 5.00   Pack years: 2.50   Types: Cigarettes   Quit date: 10/16/1968   Years since quitting: 53.2  Smokeless Tobacco Never    Goals Met:  Independence with exercise equipment Exercise tolerated well No report of concerns or symptoms today Strength training completed today  Goals Unmet:  Not Applicable  Comments: Pt able to follow exercise prescription today without complaint.  Will continue to monitor for progression.    Dr. Emily Filbert is Medical Director for [place].  Dr. Ottie Glazier is Medical Director for [facility] Pulmonary Rehabilitation.

## 2021-12-26 ENCOUNTER — Other Ambulatory Visit: Payer: Self-pay

## 2021-12-26 DIAGNOSIS — Z952 Presence of prosthetic heart valve: Secondary | ICD-10-CM

## 2021-12-26 DIAGNOSIS — Z48812 Encounter for surgical aftercare following surgery on the circulatory system: Secondary | ICD-10-CM | POA: Diagnosis not present

## 2021-12-26 NOTE — Progress Notes (Signed)
Daily Session Note  Patient Details  Name: Joshua Berry MRN: [id number] Date of Birth: 01-Jan-1946 Referring Provider:   Flowsheet Row Cardiac Rehab from 12/04/2021 in [facility] Cardiac and Pulmonary Rehab  Referring Provider Lorelei Pont MD ([place])       Encounter Date: 12/26/2021  Check In:  Session Check In - 12/26/21 1343       Check-In   Supervising physician immediately available to respond to emergencies See telemetry face sheet for immediately available ER MD    Location [facility]-Cardiac & Pulmonary Rehab    Staff Present Birdie Sons, MPA, RN;Joseph Lou Miner, MS, ASCM CEP, Exercise Physiologist    Virtual Visit No    Medication changes reported     No    Fall or balance concerns reported    No    Warm-up and Cool-down Performed on first and last piece of equipment    Resistance Training Performed Yes    VAD Patient? No    PAD/SET Patient? No      Pain Assessment   Currently in Pain? No/denies                Social History   Tobacco Use  Smoking Status Former   Packs/day: 0.50   Years: 5.00   Pack years: 2.50   Types: Cigarettes   Quit date: 10/16/1968   Years since quitting: 53.2  Smokeless Tobacco Never    Goals Met:  Independence with exercise equipment Exercise tolerated well No report of concerns or symptoms today Strength training completed today  Goals Unmet:  Not Applicable  Comments: Pt able to follow exercise prescription today without complaint.  Will continue to monitor for progression.    Dr. Emily Filbert is Medical Director for [place].  Dr. Ottie Glazier is Medical Director for [facility] Pulmonary Rehabilitation.

## 2022-01-07 ENCOUNTER — Other Ambulatory Visit: Payer: Self-pay

## 2022-01-07 DIAGNOSIS — Z952 Presence of prosthetic heart valve: Secondary | ICD-10-CM

## 2022-01-07 DIAGNOSIS — Z48812 Encounter for surgical aftercare following surgery on the circulatory system: Secondary | ICD-10-CM | POA: Diagnosis not present

## 2022-01-07 NOTE — Progress Notes (Signed)
Daily Session Note  Patient Details  Name: Joshua Berry MRN: [id number] Date of Birth: 1946/11/08 Referring Provider:   Flowsheet Row Cardiac Rehab from 12/04/2021 in [facility] Cardiac and Pulmonary Rehab  Referring Provider Lorelei Pont MD ([place])       Encounter Date: 01/07/2022  Check In:  Session Check In - 01/07/22 1341       Check-In   Supervising physician immediately available to respond to emergencies See telemetry face sheet for immediately available ER MD    Location [facility]-Cardiac & Pulmonary Rehab    Staff Present Birdie Sons, MPA, Nino Glow, MS, ASCM CEP, Exercise Physiologist;Joseph Tessie [place], [place]    Virtual Visit No    Medication changes reported     No    Fall or balance concerns reported    No    Warm-up and Cool-down Performed on first and last piece of equipment    Resistance Training Performed Yes    VAD Patient? No    PAD/SET Patient? No      Pain Assessment   Currently in Pain? No/denies                Social History   Tobacco Use  Smoking Status Former   Packs/day: 0.50   Years: 5.00   Pack years: 2.50   Types: Cigarettes   Quit date: 10/16/1968   Years since quitting: 53.2  Smokeless Tobacco Never    Goals Met:  Independence with exercise equipment Exercise tolerated well No report of concerns or symptoms today Strength training completed today  Goals Unmet:  Not Applicable  Comments: Pt able to follow exercise prescription today without complaint.  Will continue to monitor for progression.    Dr. Emily Filbert is Medical Director for [place].  Dr. Ottie Glazier is Medical Director for [facility] Pulmonary Rehabilitation.

## 2022-01-09 ENCOUNTER — Encounter: Payer: Self-pay | Admitting: *Deleted

## 2022-01-09 DIAGNOSIS — Z952 Presence of prosthetic heart valve: Secondary | ICD-10-CM

## 2022-01-09 NOTE — Progress Notes (Signed)
Cardiac Individual Treatment Plan  Patient Details  Name: Joshua Berry MRN: [id number] Date of Birth: [birth year] Referring Provider:   Flowsheet Row Cardiac Rehab from 12/04/2021 in [facility] Cardiac and Pulmonary Rehab  Referring Provider Lorelei Pont MD ([place])       Initial Encounter Date:  Flowsheet Row Cardiac Rehab from 12/04/2021 in [facility] Cardiac and Pulmonary Rehab  Date 12/04/21       Visit Diagnosis: S/P TAVR (transcatheter aortic valve replacement)  Patient's Home Medications on Admission:  Current Outpatient Medications:    albuterol (VENTOLIN HFA) 108 (90 Base) MCG/ACT inhaler, Inhale 1-2 puffs into the lungs every 6 (six) hours as needed for wheezing or shortness of breath. (Patient not taking: Reported on 11/16/2021), Disp: , Rfl:    albuterol (VENTOLIN HFA) 108 (90 Base) MCG/ACT inhaler, Inhale into the lungs., Disp: , Rfl:    amoxicillin (AMOXIL) 500 MG capsule, TAKE FOUR CAPSULES BY MOUTH AS DIRECTED BY YOUR PROVIDER 30 TO 60 MINUTES PRIOR TO PROCEDURES., Disp: , Rfl:    ascorbic acid (VITAMIN C) 500 MG tablet, Take 2 tablets by mouth daily., Disp: , Rfl:    atorvastatin (LIPITOR) 10 MG tablet, Take 20 mg by mouth daily., Disp: , Rfl:    augmented betamethasone dipropionate (DIPROLENE-AF) 0.05 % cream, Apply 1 application topically 2 (two) times daily., Disp: , Rfl:    carboxymethylcellulose (REFRESH PLUS) 0.5 % SOLN, 1 drop at bedtime., Disp: , Rfl:    Carboxymethylcellulose Sodium 1 % GEL, APPLY 1 DROP TO EACH EYE AT BEDTIME (Patient not taking: Reported on 11/16/2021), Disp: , Rfl:    cholecalciferol (VITAMIN D) 25 MCG (1000 UT) tablet, Take 1,000 Units by mouth 2 (two) times daily.  (Patient not taking: Reported on 11/16/2021), Disp: , Rfl:    Cholecalciferol 50 MCG (2000 UT) TABS, Take 1 tablet by mouth daily., Disp: , Rfl:    famotidine (PEPCID) 40 MG tablet, Take by mouth., Disp: , Rfl:    ferrous sulfate 325 (65 FE) MG tablet, Take 325 mg by mouth daily with  breakfast. (Patient not taking: Reported on 11/16/2021), Disp: , Rfl:    fluorouracil (EFUDEX) 5 % cream, APPLY AS DIRECTED TO AFFECTED AREA AS DIRECTED START OCT OR JAN. APPLY 4-6 WEEKS AS TOLERATED; [place], PAT DRY, DO NOT RUB; TREAT: SCALP/FACE/EARS/NECK/ARMS; DO NOT APPLY NEAR EYES, NOSTRILS, MOUTH; [place] HANDS AFTERWARDS; APPLY MOISTURIZER EVERY 2 HOURS WHEN AWAKE; AVOID SUN. SEE HANDOUT, Disp: , Rfl:    fluticasone (FLONASE) 50 MCG/ACT nasal spray, Place 1 spray into both nostrils daily., Disp: , Rfl:    folic acid (FOLVITE) 1 MG tablet, Take 1 mg by mouth at bedtime. (Patient not taking: Reported on 11/16/2021), Disp: , Rfl:    folic acid (FOLVITE) 1 MG tablet, Take 1 tablet by mouth daily., Disp: , Rfl:    furosemide (LASIX) 20 MG tablet, Take by mouth., Disp: , Rfl:    furosemide (LASIX) 40 MG tablet, Take 1 tablet (40 mg total) by mouth daily., Disp: 30 tablet, Rfl: 0   Glycerin-Hypromellose-PEG 400 0.2-0.2-1 % SOLN, Apply 1 drop to eye 4 (four) times daily., Disp: , Rfl:    insulin aspart (NOVOLOG) 100 UNIT/ML FlexPen, Inject into the skin., Disp: , Rfl:    insulin aspart (NOVOLOG) 100 UNIT/ML injection, Inject 10 Units into the skin 3 (three) times daily before meals. (Patient not taking: Reported on 11/16/2021), Disp: 10 mL, Rfl: 11   insulin glargine (LANTUS) 100 UNIT/ML injection, Inject 0.16 mLs (16 Units total) into the  skin daily. (Patient not taking: Reported on 11/16/2021), Disp: 10 mL, Rfl: 11   insulin glargine (LANTUS) 100 UNIT/ML Solostar Pen, Inject into the skin., Disp: , Rfl:    insulin glargine-yfgn (SEMGLEE) 100 UNIT/ML Pen, INJECT 14 UNITS SUBCUTANEOUSLY DAILY (USE WITHIN 28 DAYS AFTER OPENING PEN) (Patient not taking: Reported on 11/16/2021), Disp: , Rfl:    ipratropium-albuterol (DUONEB) 0.5-2.5 (3) MG/3ML SOLN, Take 3 mLs by nebulization every 4 (four) hours as needed (shortness of breath).  (Patient not taking: Reported on 11/16/2021), Disp: , Rfl:    ketotifen (ZADITOR) 0.025  % ophthalmic solution, Place 1 drop into both eyes 2 (two) times daily. (Patient not taking: Reported on 11/16/2021), Disp: , Rfl:    ketotifen (ZADITOR) 0.025 % ophthalmic solution, Place 1 drop into both eyes as needed (for allergies)., Disp: , Rfl:    Lactobacillus Rhamnosus, GG, (RA PROBIOTIC DIGESTIVE CARE) CAPS, Take 1 capsule by mouth daily., Disp: , Rfl:    lactulose (CHRONULAC) 10 GM/15ML solution, Take 10 g by mouth 3 (three) times daily. (Patient not taking: Reported on 11/16/2021), Disp: , Rfl:    lactulose (CHRONULAC) 10 GM/15ML solution, Take by mouth., Disp: , Rfl:    levothyroxine (SYNTHROID) 100 MCG tablet, TAKE ONE TABLET BY MOUTH DAILY ON AN EMPTY STOMACH, Disp: , Rfl:    levothyroxine (SYNTHROID, LEVOTHROID) 100 MCG tablet, Take 100 mcg by mouth daily before breakfast. (Patient not taking: Reported on 11/16/2021), Disp: , Rfl:    Lidocaine 4 % PTCH, Place onto the skin., Disp: , Rfl:    MAGNESIUM GLUCONATE PO, Take 420 mg by mouth daily. (Patient not taking: Reported on 11/16/2021), Disp: , Rfl:    Magnesium Oxide 420 MG TABS, Take 2 tablets by mouth 2 (two) times daily., Disp: , Rfl:    octreotide (SANDOSTATIN LAR) 20 MG injection, INJECT 1 VIAL (20MG) INTRAMUSCULARLY EVERY 28 DAYS (FOR INFUSION CLINIC), Disp: , Rfl:    Omega-3 Fatty Acids (FISH OIL) 1000 MG CAPS, Take 1 capsule by mouth 2 (two) times daily., Disp: , Rfl:    pantoprazole (PROTONIX) 40 MG tablet, Take 1 tablet (40 mg total) by mouth 2 (two) times daily., Disp: 30 tablet, Rfl: 3   pantoprazole (PROTONIX) 40 MG tablet, Take 1 tablet (40 mg total) by mouth 2 (two) times daily., Disp: 180 tablet, Rfl: 3   pantoprazole (PROTONIX) 40 MG tablet, Take 1 tablet (40 mg total) by mouth daily for 14 days., Disp: 14 tablet, Rfl: 0   Polyethyl Glycol-Propyl Glycol 0.4-0.3 % SOLN, INSTILL 1 DROP IN EACH EYE FOUR TIMES A DAY, Disp: , Rfl:    Probiotic Product ([employer] ADV PROBIOTIC GUMMIES PO), Take 1 each by mouth daily., Disp: , Rfl:     rifaximin (XIFAXAN) 550 MG TABS tablet, Take 550 mg by mouth 2 (two) times daily. (Patient not taking: Reported on 11/16/2021), Disp: , Rfl:    rifaximin (XIFAXAN) 550 MG TABS tablet, Take by mouth., Disp: , Rfl:    Semaglutide,0.25 or 0.5MG/DOS, 2 MG/1.5ML SOPN, Inject 0.5 mg into the skin once a week., Disp: , Rfl:    sucralfate (CARAFATE) 1 g tablet, Take 1 tablet (1 g total) by mouth 4 (four) times daily., Disp: 60 tablet, Rfl: 0   terazosin (HYTRIN) 5 MG capsule, Take 5 mg by mouth at bedtime. (Patient not taking: Reported on 11/16/2021), Disp: , Rfl:    terazosin (HYTRIN) 5 MG capsule, Take 1 capsule by mouth at bedtime., Disp: , Rfl:    vitamin C (ASCORBIC  ACID) 500 MG tablet, Take 500 mg by mouth daily. (Patient not taking: Reported on 11/16/2021), Disp: , Rfl:    vitamin E 180 MG (400 UNITS) capsule, Take 1 capsule by mouth at bedtime., Disp: , Rfl:   Past Medical History: Past Medical History:  Diagnosis Date   Arthritis    Bleeding ulcer    Cancer ([place])    melanoma, carcinoma   Cirrhosis of liver ([place])    Diabetes mellitus without complication ([place])    Heart murmur    Hypertension    Iron deficiency anemia    NASH (nonalcoholic steatohepatitis)    Sleep apnea    Spleen enlarged    Thrombocytopenia ([facility])     Tobacco Use: Social History   Tobacco Use  Smoking Status Former   Packs/day: 0.50   Years: 5.00   Pack years: 2.50   Types: Cigarettes   Quit date: 10/16/1968   Years since quitting: 53.2  Smokeless Tobacco Never    Labs: Recent Review Flowsheet Data     Labs for ITP Cardiac and Pulmonary Rehab Latest Ref Rng & Units 08/07/2018 07/19/2019 04/24/2021   Hemoglobin A1c 4.8 - 5.6 % - 6.1(H) 4.8   HCO3 20.0 - 28.0 mmol/L 26.4 - -   O2SAT % 70.7 - -        Exercise Target Goals: Exercise Program Goal: Individual exercise prescription set using results from initial 6 min walk test and THRR while considering  patients activity barriers and safety.    Exercise Prescription Goal: Initial exercise prescription builds to 30-45 minutes a day of aerobic activity, 2-3 days per week.  Home exercise guidelines will be given to patient during program as part of exercise prescription that the participant will acknowledge.   Education: Aerobic Exercise: - Group verbal and visual presentation on the components of exercise prescription. Introduces F.I.T.T principle from ACSM for exercise prescriptions.  Reviews F.I.T.T. principles of aerobic exercise including progression. Written material given at graduation. Flowsheet Row Cardiac Rehab from 12/26/2021 in [facility] Cardiac and Pulmonary Rehab  Education need identified 12/04/21       Education: Resistance Exercise: - Group verbal and visual presentation on the components of exercise prescription. Introduces F.I.T.T principle from ACSM for exercise prescriptions  Reviews F.I.T.T. principles of resistance exercise including progression. Written material given at graduation.    Education: Exercise & Equipment Safety: - Individual verbal instruction and demonstration of equipment use and safety with use of the equipment. Flowsheet Row Cardiac Rehab from 11/16/2021 in [facility] Cardiac and Pulmonary Rehab  Date 11/16/21  Educator [facility]  Instruction Review Code 1- Verbalizes Understanding       Education: Exercise Physiology & General Exercise Guidelines: - Group verbal and written instruction with models to review the exercise physiology of the cardiovascular system and associated critical values. Provides general exercise guidelines with specific guidelines to those with heart or lung disease.  Flowsheet Row Cardiac Rehab from 12/26/2021 in [facility] Cardiac and Pulmonary Rehab  Education need identified 12/04/21       Education: Flexibility, Balance, Mind/Body Relaxation: - Group verbal and visual presentation with interactive activity on the components of exercise prescription. Introduces F.I.T.T principle  from ACSM for exercise prescriptions. Reviews F.I.T.T. principles of flexibility and balance exercise training including progression. Also discusses the mind body connection.  Reviews various relaxation techniques to help reduce and manage stress (i.e. Deep breathing, progressive muscle relaxation, and visualization). Balance handout provided to take home. Written material given at graduation. Flowsheet Row Cardiac Rehab from  12/26/2021 in [facility] Cardiac and Pulmonary Rehab  Date 12/26/21  Educator AS  Instruction Review Code 1- Verbalizes Understanding       Activity Barriers & Risk Stratification:  Activity Barriers & Cardiac Risk Stratification - 12/04/21 1030       Activity Barriers & Cardiac Risk Stratification   Activity Barriers Deconditioning;Muscular Weakness;Balance Concerns;Neck/Spine Problems;Assistive Device    Cardiac Risk Stratification Moderate             6 Minute Walk:  6 Minute Walk     Row Name 12/04/21 1035         6 Minute Walk   Phase Initial     Distance 605 feet     Walk Time 6 minutes     # of Rest Breaks 0     MPH 1.14     METS 0.94     RPE 11     Perceived Dyspnea  2     VO2 Peak 3.31     Symptoms Yes (comment)     Comments L knee pain 4/10, SOB, fatigue     Resting HR 62 bpm     Resting BP 104/58     Resting Oxygen Saturation  99 %     Exercise Oxygen Saturation  during 6 min walk 98 %     Max Ex. HR 90 bpm     Max Ex. BP 118/60     2 Minute Post BP 102/56              Oxygen Initial Assessment:   Oxygen Re-Evaluation:   Oxygen Discharge (Final Oxygen Re-Evaluation):   Initial Exercise Prescription:  Initial Exercise Prescription - 12/04/21 1000       Date of Initial Exercise RX and Referring Provider   Date 12/04/21    Referring Provider Lorelei Pont MD ([facility])      Oxygen   Maintain Oxygen Saturation 88% or higher      Recumbant Bike   Level 1    RPM 60    Watts 15    Minutes 15    METs 1      NuStep    Level 1    SPM 80    Minutes 15    METs 1      REL-XR   Level 1    Speed 50    Minutes 15    METs 1      Track   Laps 10    Minutes 15    METs 1.54      Prescription Details   Frequency (times per week) 3    Duration Progress to 30 minutes of continuous aerobic without signs/symptoms of physical distress      Intensity   THRR 40-80% of Max Heartrate 94-127    Ratings of Perceived Exertion 11-13    Perceived Dyspnea 0-4      Progression   Progression Continue to progress workloads to maintain intensity without signs/symptoms of physical distress.      Resistance Training   Training Prescription Yes    Weight 3 lb    Reps 10-15             Perform Capillary Blood Glucose checks as needed.  Exercise Prescription Changes:   Exercise Prescription Changes     Row Name 12/04/21 1000             Response to Exercise   Blood Pressure (Admit) 104/58       Blood Pressure (  Exercise) 118/60       Blood Pressure (Exit) 102/56       Heart Rate (Admit) 62 bpm       Heart Rate (Exercise) 90 bpm       Heart Rate (Exit) 69 bpm       Oxygen Saturation (Admit) 99 %       Oxygen Saturation (Exercise) 98 %       Rating of Perceived Exertion (Exercise) 11       Perceived Dyspnea (Exercise) 2       Symptoms Left knee pain 4/10, SOB, Fatigue       Comments walk test results                Exercise Comments:   Exercise Comments     Row Name 12/19/21 1350           Exercise Comments First full day of exercise!  Patient was oriented to gym and equipment including functions, settings, policies, and procedures.  Patient's individual exercise prescription and treatment plan were reviewed.  All starting workloads were established based on the results of the 6 minute walk test done at initial orientation visit.  The plan for exercise progression was also introduced and progression will be customized based on patient's performance and goals.                 Exercise Goals and Review:   Exercise Goals     Row Name 12/04/21 1043             Exercise Goals   Increase Physical Activity Yes       Intervention Provide advice, education, support and counseling about physical activity/exercise needs.;Develop an individualized exercise prescription for aerobic and resistive training based on initial evaluation findings, risk stratification, comorbidities and participant's personal goals.       Expected Outcomes Short Term: Attend rehab on a regular basis to increase amount of physical activity.;Long Term: Add in home exercise to make exercise part of routine and to increase amount of physical activity.;Long Term: Exercising regularly at least 3-5 days a week.       Increase Strength and Stamina Yes       Intervention Develop an individualized exercise prescription for aerobic and resistive training based on initial evaluation findings, risk stratification, comorbidities and participant's personal goals.;Provide advice, education, support and counseling about physical activity/exercise needs.       Expected Outcomes Short Term: Increase workloads from initial exercise prescription for resistance, speed, and METs.;Short Term: Perform resistance training exercises routinely during rehab and add in resistance training at home;Long Term: Improve cardiorespiratory fitness, muscular endurance and strength as measured by increased METs and functional capacity (6MWT)       Able to understand and use rate of perceived exertion (RPE) scale Yes       Intervention Provide education and explanation on how to use RPE scale       Expected Outcomes Short Term: Able to use RPE daily in rehab to express subjective intensity level;Long Term:  Able to use RPE to guide intensity level when exercising independently       Able to understand and use Dyspnea scale Yes       Intervention Provide education and explanation on how to use Dyspnea scale       Expected Outcomes  Short Term: Able to use Dyspnea scale daily in rehab to express subjective sense of shortness of breath during exertion;Long Term: Able to use Dyspnea scale  to guide intensity level when exercising independently       Knowledge and understanding of Target Heart Rate Range (THRR) Yes       Intervention Provide education and explanation of THRR including how the numbers were predicted and where they are located for reference       Expected Outcomes Short Term: Able to state/look up THRR;Long Term: Able to use THRR to govern intensity when exercising independently;Short Term: Able to use daily as guideline for intensity in rehab       Able to check pulse independently Yes       Intervention Provide education and demonstration on how to check pulse in carotid and radial arteries.;Review the importance of being able to check your own pulse for safety during independent exercise       Expected Outcomes Short Term: Able to explain why pulse checking is important during independent exercise;Long Term: Able to check pulse independently and accurately       Understanding of Exercise Prescription Yes       Intervention Provide education, explanation, and written materials on patient's individual exercise prescription       Expected Outcomes Short Term: Able to explain program exercise prescription;Long Term: Able to explain home exercise prescription to exercise independently                Exercise Goals Re-Evaluation :  Exercise Goals Re-Evaluation     Row Name 12/19/21 1350 01/07/22 1428           Exercise Goal Re-Evaluation   Exercise Goals Review Knowledge and understanding of Target Heart Rate Range (THRR);Understanding of Exercise Prescription;Able to understand and use rate of perceived exertion (RPE) scale;Increase Physical Activity;Able to understand and use Dyspnea scale;Able to check pulse independently;Increase Strength and Stamina Increase Physical Activity;Increase Strength and Stamina       Comments Reviewed RPE and dyspnea scales, THR and program prescription with pt today.  Pt voiced understanding and was given a copy of goals to take home. Patient states that he is working out at home about 3 days a week. He likes to work on Scientist, water quality. He balances when he is in the kitchen and can hold onto something.      Expected Outcomes Short: Use RPE daily to regulate intensity. Long: Follow program prescription in THR. Short: imporove levels and distance walked. Long: maintain walking and strength training independently               Discharge Exercise Prescription (Final Exercise Prescription Changes):  Exercise Prescription Changes - 12/04/21 1000       Response to Exercise   Blood Pressure (Admit) 104/58    Blood Pressure (Exercise) 118/60    Blood Pressure (Exit) 102/56    Heart Rate (Admit) 62 bpm    Heart Rate (Exercise) 90 bpm    Heart Rate (Exit) 69 bpm    Oxygen Saturation (Admit) 99 %    Oxygen Saturation (Exercise) 98 %    Rating of Perceived Exertion (Exercise) 11    Perceived Dyspnea (Exercise) 2    Symptoms Left knee pain 4/10, SOB, Fatigue    Comments walk test results             Nutrition:  Target Goals: Understanding of nutrition guidelines, daily intake of sodium <1569m, cholesterol <2047m calories 30% from fat and 7% or less from saturated fats, daily to have 5 or more servings of fruits and vegetables.  Education: All About Nutrition: -Group instruction  provided by verbal, written material, interactive activities, discussions, models, and posters to present general guidelines for heart healthy nutrition including fat, fiber, MyPlate, the role of sodium in heart healthy nutrition, utilization of the nutrition label, and utilization of this knowledge for meal planning. Follow up email sent as well. Written material given at graduation. Flowsheet Row Cardiac Rehab from 12/26/2021 in [facility] Cardiac and Pulmonary Rehab  Education  need identified 12/04/21       Biometrics:  Pre Biometrics - 12/04/21 1029       Pre Biometrics   Height 5' 11.75" (1.822 m)    Weight 234 lb 12.8 oz (106.5 kg)    BMI (Calculated) 32.08    Single Leg Stand 0 seconds              Nutrition Therapy Plan and Nutrition Goals:  Nutrition Therapy & Goals - 12/04/21 1018       Intervention Plan   Intervention Prescribe, educate and counsel regarding individualized specific dietary modifications aiming towards targeted core components such as weight, hypertension, lipid management, diabetes, heart failure and other comorbidities.    Expected Outcomes Short Term Goal: Understand basic principles of dietary content, such as calories, fat, sodium, cholesterol and nutrients.;Short Term Goal: A plan has been developed with personal nutrition goals set during dietitian appointment.;Long Term Goal: Adherence to prescribed nutrition plan.             Nutrition Assessments:  MEDIFICTS Score Key: ?70 Need to make dietary changes  40-70 Heart Healthy Diet ? 40 Therapeutic Level Cholesterol Diet  Flowsheet Row Cardiac Rehab from 12/04/2021 in [facility] Cardiac and Pulmonary Rehab  Picture Your Plate Total Score on Admission 70      Picture Your Plate Scores: <63 Unhealthy dietary pattern with much room for improvement. 41-50 Dietary pattern unlikely to meet recommendations for good health and room for improvement. 51-60 More healthful dietary pattern, with some room for improvement.  >60 Healthy dietary pattern, although there may be some specific behaviors that could be improved.    Nutrition Goals Re-Evaluation:  Nutrition Goals Re-Evaluation     [place] Name 01/07/22 1431             Goals   Nutrition Goal Lose some weight.       Comment Patient is meeting with the dietitian to talk about his eating habits.       Expected Outcome Short: meet with dietitian. Long: maintain eating habits independently                 Nutrition Goals Discharge (Final Nutrition Goals Re-Evaluation):  Nutrition Goals Re-Evaluation - 01/07/22 1431       Goals   Nutrition Goal Lose some weight.    Comment Patient is meeting with the dietitian to talk about his eating habits.    Expected Outcome Short: meet with dietitian. Long: maintain eating habits independently             Psychosocial: Target Goals: Acknowledge presence or absence of significant depression and/or stress, maximize coping skills, provide positive support system. Participant is able to verbalize types and ability to use techniques and skills needed for reducing stress and depression.   Education: Stress, Anxiety, and Depression - Group verbal and visual presentation to define topics covered.  Reviews how body is impacted by stress, anxiety, and depression.  Also discusses healthy ways to reduce stress and to treat/manage anxiety and depression.  Written material given at graduation.   Education: Sleep Hygiene -Provides  group verbal and written instruction about how sleep can affect your health.  Define sleep hygiene, discuss sleep cycles and impact of sleep habits. Review good sleep hygiene tips.    Initial Review & Psychosocial Screening:  Initial Psych Review & Screening - 11/16/21 1023       Initial Review   Current issues with Current Psychotropic Meds;Current Sleep Concerns      Family Dynamics   Good Support System? Yes    Comments Huan takes melatonin at night and is out right now so it has been hard for him to sleep. He can look to his wife for mental support, daughter, son and a health care aid.      Barriers   Psychosocial barriers to participate in program The patient should benefit from training in stress management and relaxation.;Psychosocial barriers identified (see note)      Screening Interventions   Interventions Encouraged to exercise;To provide support and resources with identified psychosocial needs;Provide  feedback about the scores to participant    Expected Outcomes Short Term goal: Utilizing psychosocial counselor, staff and physician to assist with identification of specific Stressors or current issues interfering with healing process. Setting desired goal for each stressor or current issue identified.;Long Term Goal: Stressors or current issues are controlled or eliminated.;Short Term goal: Identification and review with participant of any Quality of Life or Depression concerns found by scoring the questionnaire.;Long Term goal: The participant improves quality of Life and PHQ9 Scores as seen by post scores and/or verbalization of changes             Quality of Life Scores:   Quality of Life - 12/04/21 1029       Quality of Life   Select Quality of Life      Quality of Life Scores   Health/Function Pre 19 %    Socioeconomic Pre 22.5 %    Psych/Spiritual Pre 25.93 %    Family Pre 20 %    GLOBAL Pre 21.29 %            Scores of 19 and below usually indicate a poorer quality of life in these areas.  A difference of  2-3 points is a clinically meaningful difference.  A difference of 2-3 points in the total score of the Quality of Life Index has been associated with significant improvement in overall quality of life, self-image, physical symptoms, and general health in studies assessing change in quality of life.  PHQ-9: Recent Review Flowsheet Data     Depression screen [facility] 2/9 01/07/2022 12/04/2021   Decreased Interest 1 2   Down, Depressed, Hopeless 0 1   PHQ - 2 Score 1 3   Altered sleeping 1 2   Tired, decreased energy 1 2   Change in appetite 1 1   Feeling bad or failure about yourself  0 0   Trouble concentrating - 0   Moving slowly or fidgety/restless 0 1   Suicidal thoughts 0 0   PHQ-9 Score 4 9   Difficult doing work/chores Not difficult at all Very difficult      Interpretation of Total Score  Total Score Depression Severity:  1-4 = Minimal depression, 5-9 =  Mild depression, 10-14 = Moderate depression, 15-19 = Moderately severe depression, 20-27 = Severe depression   Psychosocial Evaluation and Intervention:  Psychosocial Evaluation - 11/16/21 1025       Psychosocial Evaluation & Interventions   Interventions Encouraged to exercise with the program and follow exercise prescription;Relaxation education;Stress  management education    Comments Miquan takes melatonin at night and is out right now so it has been hard for him to sleep. He can look to his wife for mental support, daughter, son and a health care aid.    Expected Outcomes Short: Start [facility] to help with mood. Long: Maintain a healthy mental state    Continue Psychosocial Services  Follow up required by staff             Psychosocial Re-Evaluation:  Psychosocial Re-Evaluation     [place] Name 01/07/22 1424             Psychosocial Re-Evaluation   Current issues with None Identified       Comments Reviewed patient health questionnaire (PHQ-9) with patient for follow up. Previously, patients score indicated signs/symptoms of depression.  Reviewed to see if patient is improving symptom wise while in program.  Score improved and patient states that it is because he can exercise more.       Expected Outcomes Short: Continue to attend [facility] regularly for regular exercise and social engagement. Long: Continue to improve symptoms and manage a positive mental state.       Interventions Encouraged to attend Cardiac Rehabilitation for the exercise       Continue Psychosocial Services  Follow up required by staff                Psychosocial Discharge (Final Psychosocial Re-Evaluation):  Psychosocial Re-Evaluation - 01/07/22 1424       Psychosocial Re-Evaluation   Current issues with None Identified    Comments Reviewed patient health questionnaire (PHQ-9) with patient for follow up. Previously, patients score indicated signs/symptoms of depression.  Reviewed to  see if patient is improving symptom wise while in program.  Score improved and patient states that it is because he can exercise more.    Expected Outcomes Short: Continue to attend [facility] regularly for regular exercise and social engagement. Long: Continue to improve symptoms and manage a positive mental state.    Interventions Encouraged to attend Cardiac Rehabilitation for the exercise    Continue Psychosocial Services  Follow up required by staff             Vocational Rehabilitation: Provide vocational rehab assistance to qualifying candidates.   Vocational Rehab Evaluation & Intervention:   Education: Education Goals: Education classes will be provided on a variety of topics geared toward better understanding of heart health and risk factor modification. Participant will state understanding/return demonstration of topics presented as noted by education test scores.  Learning Barriers/Preferences:  Learning Barriers/Preferences - 11/16/21 1021       Learning Barriers/Preferences   Learning Barriers None    Learning Preferences None             General Cardiac Education Topics:  AED/CPR: - Group verbal and written instruction with the use of models to demonstrate the basic use of the AED with the basic ABC's of resuscitation.   Anatomy and Cardiac Procedures: - Group verbal and visual presentation and models provide information about basic cardiac anatomy and function. Reviews the testing methods done to diagnose heart disease and the outcomes of the test results. Describes the treatment choices: Medical Management, Angioplasty, or Coronary Bypass Surgery for treating various heart conditions including Myocardial Infarction, Angina, Valve Disease, and Cardiac Arrhythmias.  Written material given at graduation.   Medication Safety: - Group verbal and visual instruction to review commonly prescribed medications for heart and lung disease. Reviews  the  medication, class of the drug, and side effects. Includes the steps to properly store meds and maintain the prescription regimen.  Written material given at graduation.   Intimacy: - Group verbal instruction through game format to discuss how heart and lung disease can affect sexual intimacy. Written material given at graduation..   Know Your Numbers and Heart Failure: - Group verbal and visual instruction to discuss disease risk factors for cardiac and pulmonary disease and treatment options.  Reviews associated critical values for Overweight/Obesity, Hypertension, Cholesterol, and Diabetes.  Discusses basics of heart failure: signs/symptoms and treatments.  Introduces Heart Failure Zone chart for action plan for heart failure.  Written material given at graduation.   Infection Prevention: - Provides verbal and written material to individual with discussion of infection control including proper hand washing and proper equipment cleaning during exercise session. Flowsheet Row Cardiac Rehab from 11/16/2021 in [facility] Cardiac and Pulmonary Rehab  Date 11/16/21  Educator [facility]  Instruction Review Code 1- Verbalizes Understanding       Falls Prevention: - Provides verbal and written material to individual with discussion of falls prevention and safety. Flowsheet Row Cardiac Rehab from 11/16/2021 in [facility] Cardiac and Pulmonary Rehab  Date 11/16/21  Educator [facility]  Instruction Review Code 1- Verbalizes Understanding       Other: -Provides group and verbal instruction on various topics (see comments)   Knowledge Questionnaire Score:  Knowledge Questionnaire Score - 12/04/21 1018       Knowledge Questionnaire Score   Pre Score 22/26: Exercise, Nutrition             Core Components/Risk Factors/Patient Goals at Admission:  Personal Goals and Risk Factors at Admission - 12/04/21 1043       Core Components/Risk Factors/Patient Goals on Admission    Weight Management Yes;Weight Loss    patient would like to maintain   Intervention Weight Management: Develop a combined nutrition and exercise program designed to reach desired caloric intake, while maintaining appropriate intake of nutrient and fiber, sodium and fats, and appropriate energy expenditure required for the weight goal.;Weight Management/Obesity: Establish reasonable short term and long term weight goals.;Weight Management: Provide education and appropriate resources to help participant work on and attain dietary goals.    Admit Weight 234 lb (106.1 kg)    Goal Weight: Short Term 230 lb (104.3 kg)    Goal Weight: Long Term 227 lb (103 kg)    Expected Outcomes Short Term: Continue to assess and modify interventions until short term weight is achieved;Long Term: Adherence to nutrition and physical activity/exercise program aimed toward attainment of established weight goal;Weight Loss: Understanding of general recommendations for a balanced deficit meal plan, which promotes 1-2 lb weight loss per week and includes a negative energy balance of [phone] kcal/d;Understanding recommendations for meals to include 15-35% energy as protein, 25-35% energy from fat, 35-60% energy from carbohydrates, less than 274m of dietary cholesterol, 20-35 gm of total fiber daily;Understanding of distribution of calorie intake throughout the day with the consumption of 4-5 meals/snacks    Diabetes Yes    Intervention Provide education about signs/symptoms and action to take for hypo/hyperglycemia.;Provide education about proper nutrition, including hydration, and aerobic/resistive exercise prescription along with prescribed medications to achieve blood glucose in normal ranges: Fasting glucose 65-99 mg/dL    Expected Outcomes Short Term: Participant verbalizes understanding of the signs/symptoms and immediate care of hyper/hypoglycemia, proper foot care and importance of medication, aerobic/resistive exercise and nutrition plan for blood glucose  control.;Long Term:  Attainment of HbA1C < 7%.    Lipids Yes    Intervention Provide education and support for participant on nutrition & aerobic/resistive exercise along with prescribed medications to achieve LDL <3m, HDL >461m    Expected Outcomes Short Term: Participant states understanding of desired cholesterol values and is compliant with medications prescribed. Participant is following exercise prescription and nutrition guidelines.;Long Term: Cholesterol controlled with medications as prescribed, with individualized exercise RX and with personalized nutrition plan. Value goals: LDL < 7061mHDL > 40 mg.             Education:Diabetes - Individual verbal and written instruction to review signs/symptoms of diabetes, desired ranges of glucose level fasting, after meals and with exercise. Acknowledge that pre and post exercise glucose checks will be done for 3 sessions at entry of program. [place] [facility]  Date 11/16/21  [facility]- Verbalizes Understanding       Core Components/Risk Factors/Patient Goals Review:   Goals and Risk Factor Review     Row Name 01/07/22 1426             Core Components/Risk Factors/Patient Goals Review   Personal Goals Review Weight Management/Obesity       Review WesCoveates that he wants to lose some weight. He would like to be around 220 pounds. He is doing well in rehab and seems to be improving on his stamina.       Expected Outcomes Short: lose some weight. Long: reach weight goal.                Core Components/Risk Factors/Patient Goals at Discharge (Final Review):   Goals and Risk Factor Review - 01/07/22 1426       Core Components/Risk Factors/Patient Goals Review   Personal Goals Review Weight Management/Obesity    Review WesRaidonates that he wants to lose some weight. He would like to be around 220 pounds. He is doing well in rehab  and seems to be improving on his stamina.    Expected Outcomes Short: lose some weight. Long: reach weight goal.             ITP Comments:  ITP Comments     Row Name 11/16/21 1028 12/04/21 1017 12/12/21 1114 12/19/21 1350 01/09/22 0921   ITP Comments Virtual Visit completed. Patient informed on EP and RD appointment and 6 Minute walk test. Patient also informed of patient health questionnaires on My Chart. Patient Verbalizes understanding. [facility] Completed 6MWT and gym orientation. Initial ITP created and sent for review to Dr. MarEmily Filbertedical Director. 30 Day review completed. Medical Director ITP review done, changes made as directed, and signed approval by Medical Director.    New to program First full day of exercise!  Patient was oriented to gym and equipment including functions, settings, policies, and procedures.  Patient's individual exercise prescription and treatment plan were reviewed.  All starting workloads were established based on the results of the 6 minute walk test done at initial orientation visit.  The plan for exercise progression was also introduced and progression will be customized based on patient's performance and goals. 30 Day review completed. Medical Director ITP review done, changes made as directed, and signed approval by Medical Director.            Comments:

## 2022-01-10 ENCOUNTER — Other Ambulatory Visit: Payer: Self-pay

## 2022-01-10 DIAGNOSIS — Z48812 Encounter for surgical aftercare following surgery on the circulatory system: Secondary | ICD-10-CM | POA: Diagnosis not present

## 2022-01-10 DIAGNOSIS — Z952 Presence of prosthetic heart valve: Secondary | ICD-10-CM

## 2022-01-10 NOTE — Progress Notes (Signed)
Daily Session Note  Patient Details  Name: Joshua Berry MRN: [id number] Date of Birth: 03/31/1946 Referring Provider:   Flowsheet Row Cardiac Rehab from 12/04/2021 in [facility] Cardiac and Pulmonary Rehab  Referring Provider Lorelei Pont MD ([place])       Encounter Date: 01/10/2022  Check In:  Session Check In - 01/10/22 1346       Check-In   Supervising physician immediately available to respond to emergencies See telemetry face sheet for immediately available ER MD    Location [facility]-Cardiac & Pulmonary Rehab    Staff Present Hope Budds, RDN, LDN;Aracelia Brinson, RN, BSN;Joseph Bogue, RCP,RRT,BSRT    Virtual Visit No    Medication changes reported     No    Fall or balance concerns reported    No    Warm-up and Cool-down Performed on first and last piece of equipment    Resistance Training Performed Yes    VAD Patient? No    PAD/SET Patient? No      Pain Assessment   Currently in Pain? No/denies                Social History   Tobacco Use  Smoking Status Former   Packs/day: 0.50   Years: 5.00   Pack years: 2.50   Types: Cigarettes   Quit date: 10/16/1968   Years since quitting: 53.2  Smokeless Tobacco Never    Goals Met:  Independence with exercise equipment Exercise tolerated well No report of concerns or symptoms today Strength training completed today  Goals Unmet:  Not Applicable  Comments: Pt able to follow exercise prescription today without complaint.  Will continue to monitor for progression.   Dr. Emily Filbert is Medical Director for [place].  Dr. Ottie Glazier is Medical Director for [facility] Pulmonary Rehabilitation.

## 2022-01-17 ENCOUNTER — Encounter: Payer: No Typology Code available for payment source | Attending: Internal Medicine | Admitting: *Deleted

## 2022-01-17 ENCOUNTER — Other Ambulatory Visit: Payer: Self-pay

## 2022-01-17 DIAGNOSIS — I358 Other nonrheumatic aortic valve disorders: Secondary | ICD-10-CM | POA: Diagnosis not present

## 2022-01-17 DIAGNOSIS — Z952 Presence of prosthetic heart valve: Secondary | ICD-10-CM

## 2022-01-17 NOTE — Progress Notes (Signed)
Completed Initial RD Consultation

## 2022-01-17 NOTE — Progress Notes (Signed)
Daily Session Note  Patient Details  Name: Joshua Berry MRN: [id number] Date of Birth: Jan 16, 1946 Referring Provider:   Flowsheet Row Cardiac Rehab from 12/04/2021 in [facility] Cardiac and Pulmonary Rehab  Referring Provider Lorelei Pont MD ([place])       Encounter Date: 01/17/2022  Check In:  Session Check In - 01/17/22 1331       Check-In   Supervising physician immediately available to respond to emergencies See telemetry face sheet for immediately available ER MD    Location [facility]-Cardiac & Pulmonary Rehab    Staff Present Renita Papa, RN BSN;Joseph Hornitos, RCP,RRT,BSRT;Jessica [place], [place], RCEP, CCRP, CCET    Virtual Visit No    Medication changes reported     No    Fall or balance concerns reported    No    Warm-up and Cool-down Performed on first and last piece of equipment    Resistance Training Performed Yes    VAD Patient? No    PAD/SET Patient? No      Pain Assessment   Currently in Pain? No/denies                Social History   Tobacco Use  Smoking Status Former   Packs/day: 0.50   Years: 5.00   Pack years: 2.50   Types: Cigarettes   Quit date: 10/16/1968   Years since quitting: 53.2  Smokeless Tobacco Never    Goals Met:  Independence with exercise equipment Exercise tolerated well No report of concerns or symptoms today Strength training completed today  Goals Unmet:  Not Applicable  Comments: Pt able to follow exercise prescription today without complaint.  Will continue to monitor for progression.    Dr. Emily Filbert is Medical Director for [place].  Dr. Ottie Glazier is Medical Director for [facility] Pulmonary Rehabilitation.

## 2022-01-23 ENCOUNTER — Other Ambulatory Visit: Payer: Self-pay

## 2022-01-23 DIAGNOSIS — Z952 Presence of prosthetic heart valve: Secondary | ICD-10-CM

## 2022-01-23 NOTE — Progress Notes (Signed)
Daily Session Note  Patient Details  Name: Joshua Berry MRN: [id number] Date of Birth: 1946/06/02 Referring Provider:   Flowsheet Row Cardiac Rehab from 12/04/2021 in [facility] Cardiac and Pulmonary Rehab  Referring Provider Lorelei Pont MD ([place])       Encounter Date: 01/23/2022  Check In:  Session Check In - 01/23/22 1413       Check-In   Supervising physician immediately available to respond to emergencies See telemetry face sheet for immediately available ER MD    Location [facility]-Cardiac & Pulmonary Rehab    Staff Present Birdie Sons, MPA, RN;Joseph Tessie Fass, [place]    Virtual Visit No    Medication changes reported     No    Fall or balance concerns reported    No    Warm-up and Cool-down Not performed (comment)   education only   Resistance Training Performed No    VAD Patient? No    PAD/SET Patient? No      Pain Assessment   Currently in Pain? No/denies                Social History   Tobacco Use  Smoking Status Former   Packs/day: 0.50   Years: 5.00   Pack years: 2.50   Types: Cigarettes   Quit date: 10/16/1968   Years since quitting: 53.3  Smokeless Tobacco Never     Comments: Patient here for education only.   Dr. Emily Filbert is Medical Director for [place].  Dr. Ottie Glazier is Medical Director for [facility] Pulmonary Rehabilitation.

## 2022-02-04 ENCOUNTER — Other Ambulatory Visit: Payer: Self-pay

## 2022-02-04 ENCOUNTER — Encounter: Payer: No Typology Code available for payment source | Admitting: *Deleted

## 2022-02-04 DIAGNOSIS — Z952 Presence of prosthetic heart valve: Secondary | ICD-10-CM

## 2022-02-04 NOTE — Progress Notes (Signed)
Daily Session Note  Patient Details  Name: Joshua Berry MRN: [id number] Date of Birth: [birth year] Referring Provider:   Flowsheet Row Cardiac Rehab from 12/04/2021 in [facility] Cardiac and Pulmonary Rehab  Referring Provider Lorelei Pont MD ([place])       Encounter Date: 02/04/2022  Check In:  Session Check In - 02/04/22 1412       Check-In   Supervising physician immediately available to respond to emergencies See telemetry face sheet for immediately available ER MD    Location [facility]-Cardiac & Pulmonary Rehab    Staff Present Nyoka Cowden, RN, BSN, Tyna Jaksch, MS, ASCM CEP, Exercise Physiologist;Joseph Tessie Fass, [place]    Virtual Visit No    Medication changes reported     Yes    Comments post EGD last week "some kind of antibiotic" per patient    Fall or balance concerns reported    No    Tobacco Cessation No Change    Warm-up and Cool-down Performed on first and last piece of equipment    Resistance Training Performed Yes    VAD Patient? No    PAD/SET Patient? No      Pain Assessment   Currently in Pain? No/denies                Social History   Tobacco Use  Smoking Status Former   Packs/day: 0.50   Years: 5.00   Pack years: 2.50   Types: Cigarettes   Quit date: 10/16/1968   Years since quitting: 53.3  Smokeless Tobacco Never    Goals Met:  Independence with exercise equipment Exercise tolerated well No report of concerns or symptoms today  Goals Unmet:  Not Applicable  Comments: Pt able to follow exercise prescription today without complaint.  Will continue to monitor for progression.    Dr. Emily Filbert is Medical Director for [place].  Dr. Ottie Glazier is Medical Director for [facility] Pulmonary Rehabilitation.

## 2022-02-06 ENCOUNTER — Encounter: Payer: Self-pay | Admitting: *Deleted

## 2022-02-06 ENCOUNTER — Other Ambulatory Visit: Payer: Self-pay

## 2022-02-06 DIAGNOSIS — Z952 Presence of prosthetic heart valve: Secondary | ICD-10-CM

## 2022-02-06 NOTE — Progress Notes (Signed)
Daily Session Note  Patient Details  Name: Joshua Berry MRN: [id number] Date of Birth: 1945/12/22 Referring Provider:   Flowsheet Row Cardiac Rehab from 12/04/2021 in [facility] Cardiac and Pulmonary Rehab  Referring Provider Lorelei Pont MD ([place])       Encounter Date: 02/06/2022  Check In:  Session Check In - 02/06/22 1346       Check-In   Supervising physician immediately available to respond to emergencies See telemetry face sheet for immediately available ER MD    Location [facility]-Cardiac & Pulmonary Rehab    Staff Present Birdie Sons, MPA, Nino Glow, MS, ASCM CEP, Exercise Physiologist;Joseph Tessie Fass, [place]    Virtual Visit No    Medication changes reported     No    Fall or balance concerns reported    No    Tobacco Cessation No Change    Warm-up and Cool-down Performed on first and last piece of equipment    Resistance Training Performed Yes    VAD Patient? No    PAD/SET Patient? No      Pain Assessment   Currently in Pain? No/denies                Social History   Tobacco Use  Smoking Status Former   Packs/day: 0.50   Years: 5.00   Pack years: 2.50   Types: Cigarettes   Quit date: 10/16/1968   Years since quitting: 53.3  Smokeless Tobacco Never    Goals Met:  Independence with exercise equipment Exercise tolerated well No report of concerns or symptoms today Strength training completed today  Goals Unmet:  Not Applicable  Comments: Pt able to follow exercise prescription today without complaint.  Will continue to monitor for progression.    Dr. Emily Filbert is Medical Director for [place].  Dr. Ottie Glazier is Medical Director for [facility] Pulmonary Rehabilitation.

## 2022-02-06 NOTE — Progress Notes (Signed)
Cardiac Individual Treatment Plan  Patient Details  Name: Joshua Berry MRN: [id number] Date of Birth: 1946/03/14 Referring Provider:   Flowsheet Row Cardiac Rehab from 12/04/2021 in [facility] Cardiac and Pulmonary Rehab  Referring Provider Lorelei Pont MD ([place])       Initial Encounter Date:  Flowsheet Row Cardiac Rehab from 12/04/2021 in [facility] Cardiac and Pulmonary Rehab  Date 12/04/21       Visit Diagnosis: S/P TAVR (transcatheter aortic valve replacement)  Patient's Home Medications on Admission:  Current Outpatient Medications:    albuterol (VENTOLIN HFA) 108 (90 Base) MCG/ACT inhaler, Inhale 1-2 puffs into the lungs every 6 (six) hours as needed for wheezing or shortness of breath. (Patient not taking: Reported on 11/16/2021), Disp: , Rfl:    albuterol (VENTOLIN HFA) 108 (90 Base) MCG/ACT inhaler, Inhale into the lungs., Disp: , Rfl:    amoxicillin (AMOXIL) 500 MG capsule, TAKE FOUR CAPSULES BY MOUTH AS DIRECTED BY YOUR PROVIDER 30 TO 60 MINUTES PRIOR TO PROCEDURES., Disp: , Rfl:    ascorbic acid (VITAMIN C) 500 MG tablet, Take 2 tablets by mouth daily., Disp: , Rfl:    atorvastatin (LIPITOR) 10 MG tablet, Take 20 mg by mouth daily., Disp: , Rfl:    augmented betamethasone dipropionate (DIPROLENE-AF) 0.05 % cream, Apply 1 application topically 2 (two) times daily., Disp: , Rfl:    carboxymethylcellulose (REFRESH PLUS) 0.5 % SOLN, 1 drop at bedtime., Disp: , Rfl:    Carboxymethylcellulose Sodium 1 % GEL, APPLY 1 DROP TO EACH EYE AT BEDTIME (Patient not taking: Reported on 11/16/2021), Disp: , Rfl:    cholecalciferol (VITAMIN D) 25 MCG (1000 UT) tablet, Take 1,000 Units by mouth 2 (two) times daily.  (Patient not taking: Reported on 11/16/2021), Disp: , Rfl:    Cholecalciferol 50 MCG (2000 UT) TABS, Take 1 tablet by mouth daily., Disp: , Rfl:    famotidine (PEPCID) 40 MG tablet, Take by mouth., Disp: , Rfl:    ferrous sulfate 325 (65 FE) MG tablet, Take 325 mg by mouth daily with  breakfast. (Patient not taking: Reported on 11/16/2021), Disp: , Rfl:    fluorouracil (EFUDEX) 5 % cream, APPLY AS DIRECTED TO AFFECTED AREA AS DIRECTED START OCT OR JAN. APPLY 4-6 WEEKS AS TOLERATED; [place], PAT DRY, DO NOT RUB; TREAT: SCALP/FACE/EARS/NECK/ARMS; DO NOT APPLY NEAR EYES, NOSTRILS, MOUTH; [place] HANDS AFTERWARDS; APPLY MOISTURIZER EVERY 2 HOURS WHEN AWAKE; AVOID SUN. SEE HANDOUT, Disp: , Rfl:    fluticasone (FLONASE) 50 MCG/ACT nasal spray, Place 1 spray into both nostrils daily., Disp: , Rfl:    folic acid (FOLVITE) 1 MG tablet, Take 1 mg by mouth at bedtime. (Patient not taking: Reported on 11/16/2021), Disp: , Rfl:    folic acid (FOLVITE) 1 MG tablet, Take 1 tablet by mouth daily., Disp: , Rfl:    furosemide (LASIX) 20 MG tablet, Take by mouth., Disp: , Rfl:    furosemide (LASIX) 40 MG tablet, Take 1 tablet (40 mg total) by mouth daily., Disp: 30 tablet, Rfl: 0   Glycerin-Hypromellose-PEG 400 0.2-0.2-1 % SOLN, Apply 1 drop to eye 4 (four) times daily., Disp: , Rfl:    insulin aspart (NOVOLOG) 100 UNIT/ML FlexPen, Inject into the skin., Disp: , Rfl:    insulin aspart (NOVOLOG) 100 UNIT/ML injection, Inject 10 Units into the skin 3 (three) times daily before meals. (Patient not taking: Reported on 11/16/2021), Disp: 10 mL, Rfl: 11   insulin glargine (LANTUS) 100 UNIT/ML injection, Inject 0.16 mLs (16 Units total) into the  skin daily. (Patient not taking: Reported on 11/16/2021), Disp: 10 mL, Rfl: 11   insulin glargine (LANTUS) 100 UNIT/ML Solostar Pen, Inject into the skin., Disp: , Rfl:    insulin glargine-yfgn (SEMGLEE) 100 UNIT/ML Pen, INJECT 14 UNITS SUBCUTANEOUSLY DAILY (USE WITHIN 28 DAYS AFTER OPENING PEN) (Patient not taking: Reported on 11/16/2021), Disp: , Rfl:    ipratropium-albuterol (DUONEB) 0.5-2.5 (3) MG/3ML SOLN, Take 3 mLs by nebulization every 4 (four) hours as needed (shortness of breath).  (Patient not taking: Reported on 11/16/2021), Disp: , Rfl:    ketotifen (ZADITOR) 0.025  % ophthalmic solution, Place 1 drop into both eyes 2 (two) times daily. (Patient not taking: Reported on 11/16/2021), Disp: , Rfl:    ketotifen (ZADITOR) 0.025 % ophthalmic solution, Place 1 drop into both eyes as needed (for allergies)., Disp: , Rfl:    Lactobacillus Rhamnosus, GG, (RA PROBIOTIC DIGESTIVE CARE) CAPS, Take 1 capsule by mouth daily., Disp: , Rfl:    lactulose (CHRONULAC) 10 GM/15ML solution, Take 10 g by mouth 3 (three) times daily. (Patient not taking: Reported on 11/16/2021), Disp: , Rfl:    lactulose (CHRONULAC) 10 GM/15ML solution, Take by mouth., Disp: , Rfl:    levothyroxine (SYNTHROID) 100 MCG tablet, TAKE ONE TABLET BY MOUTH DAILY ON AN EMPTY STOMACH, Disp: , Rfl:    levothyroxine (SYNTHROID, LEVOTHROID) 100 MCG tablet, Take 100 mcg by mouth daily before breakfast. (Patient not taking: Reported on 11/16/2021), Disp: , Rfl:    Lidocaine 4 % PTCH, Place onto the skin., Disp: , Rfl:    MAGNESIUM GLUCONATE PO, Take 420 mg by mouth daily. (Patient not taking: Reported on 11/16/2021), Disp: , Rfl:    Magnesium Oxide 420 MG TABS, Take 2 tablets by mouth 2 (two) times daily., Disp: , Rfl:    octreotide (SANDOSTATIN LAR) 20 MG injection, INJECT 1 VIAL (20MG) INTRAMUSCULARLY EVERY 28 DAYS (FOR INFUSION CLINIC), Disp: , Rfl:    Omega-3 Fatty Acids (FISH OIL) 1000 MG CAPS, Take 1 capsule by mouth 2 (two) times daily., Disp: , Rfl:    pantoprazole (PROTONIX) 40 MG tablet, Take 1 tablet (40 mg total) by mouth 2 (two) times daily., Disp: 30 tablet, Rfl: 3   pantoprazole (PROTONIX) 40 MG tablet, Take 1 tablet (40 mg total) by mouth 2 (two) times daily., Disp: 180 tablet, Rfl: 3   pantoprazole (PROTONIX) 40 MG tablet, Take 1 tablet (40 mg total) by mouth daily for 14 days., Disp: 14 tablet, Rfl: 0   Polyethyl Glycol-Propyl Glycol 0.4-0.3 % SOLN, INSTILL 1 DROP IN EACH EYE FOUR TIMES A DAY, Disp: , Rfl:    Probiotic Product ([employer] ADV PROBIOTIC GUMMIES PO), Take 1 each by mouth daily., Disp: , Rfl:     rifaximin (XIFAXAN) 550 MG TABS tablet, Take 550 mg by mouth 2 (two) times daily. (Patient not taking: Reported on 11/16/2021), Disp: , Rfl:    rifaximin (XIFAXAN) 550 MG TABS tablet, Take by mouth., Disp: , Rfl:    Semaglutide,0.25 or 0.5MG/DOS, 2 MG/1.5ML SOPN, Inject 0.5 mg into the skin once a week., Disp: , Rfl:    sucralfate (CARAFATE) 1 g tablet, Take 1 tablet (1 g total) by mouth 4 (four) times daily., Disp: 60 tablet, Rfl: 0   terazosin (HYTRIN) 5 MG capsule, Take 5 mg by mouth at bedtime. (Patient not taking: Reported on 11/16/2021), Disp: , Rfl:    terazosin (HYTRIN) 5 MG capsule, Take 1 capsule by mouth at bedtime., Disp: , Rfl:    vitamin C (ASCORBIC  ACID) 500 MG tablet, Take 500 mg by mouth daily. (Patient not taking: Reported on 11/16/2021), Disp: , Rfl:    vitamin E 180 MG (400 UNITS) capsule, Take 1 capsule by mouth at bedtime., Disp: , Rfl:   Past Medical History: Past Medical History:  Diagnosis Date   Arthritis    Bleeding ulcer    Cancer ([place])    melanoma, carcinoma   Cirrhosis of liver ([place])    Diabetes mellitus without complication ([place])    Heart murmur    Hypertension    Iron deficiency anemia    NASH (nonalcoholic steatohepatitis)    Sleep apnea    Spleen enlarged    Thrombocytopenia ([facility])     Tobacco Use: Social History   Tobacco Use  Smoking Status Former   Packs/day: 0.50   Years: 5.00   Pack years: 2.50   Types: Cigarettes   Quit date: 10/16/1968   Years since quitting: 53.3  Smokeless Tobacco Never    Labs: Recent Review Flowsheet Data     Labs for ITP Cardiac and Pulmonary Rehab Latest Ref Rng & Units 08/07/2018 07/19/2019 04/24/2021   Hemoglobin A1c 4.8 - 5.6 % - 6.1(H) 4.8   HCO3 20.0 - 28.0 mmol/L 26.4 - -   O2SAT % 70.7 - -        Exercise Target Goals: Exercise Program Goal: Individual exercise prescription set using results from initial 6 min walk test and THRR while considering  patients activity barriers and safety.    Exercise Prescription Goal: Initial exercise prescription builds to 30-45 minutes a day of aerobic activity, 2-3 days per week.  Home exercise guidelines will be given to patient during program as part of exercise prescription that the participant will acknowledge.   Education: Aerobic Exercise: - Group verbal and visual presentation on the components of exercise prescription. Introduces F.I.T.T principle from ACSM for exercise prescriptions.  Reviews F.I.T.T. principles of aerobic exercise including progression. Written material given at graduation. Flowsheet Row Cardiac Rehab from 01/23/2022 in [facility] Cardiac and Pulmonary Rehab  Education need identified 12/04/21       Education: Resistance Exercise: - Group verbal and visual presentation on the components of exercise prescription. Introduces F.I.T.T principle from ACSM for exercise prescriptions  Reviews F.I.T.T. principles of resistance exercise including progression. Written material given at graduation.    Education: Exercise & Equipment Safety: - Individual verbal instruction and demonstration of equipment use and safety with use of the equipment. Flowsheet Row Cardiac Rehab from 11/16/2021 in [facility] Cardiac and Pulmonary Rehab  Date 11/16/21  Educator [facility]  Instruction Review Code 1- Verbalizes Understanding       Education: Exercise Physiology & General Exercise Guidelines: - Group verbal and written instruction with models to review the exercise physiology of the cardiovascular system and associated critical values. Provides general exercise guidelines with specific guidelines to those with heart or lung disease.  Flowsheet Row Cardiac Rehab from 01/23/2022 in [facility] Cardiac and Pulmonary Rehab  Education need identified 12/04/21       Education: Flexibility, Balance, Mind/Body Relaxation: - Group verbal and visual presentation with interactive activity on the components of exercise prescription. Introduces F.I.T.T principle from  ACSM for exercise prescriptions. Reviews F.I.T.T. principles of flexibility and balance exercise training including progression. Also discusses the mind body connection.  Reviews various relaxation techniques to help reduce and manage stress (i.e. Deep breathing, progressive muscle relaxation, and visualization). Balance handout provided to take home. Written material given at graduation. Flowsheet Row Cardiac Rehab from  01/23/2022 in [facility] Cardiac and Pulmonary Rehab  Date 12/26/21  Educator AS  Instruction Review Code 1- Verbalizes Understanding       Activity Barriers & Risk Stratification:  Activity Barriers & Cardiac Risk Stratification - 12/04/21 1030       Activity Barriers & Cardiac Risk Stratification   Activity Barriers Deconditioning;Muscular Weakness;Balance Concerns;Neck/Spine Problems;Assistive Device    Cardiac Risk Stratification Moderate             6 Minute Walk:  6 Minute Walk     Row Name 12/04/21 1035         6 Minute Walk   Phase Initial     Distance 605 feet     Walk Time 6 minutes     # of Rest Breaks 0     MPH 1.14     METS 0.94     RPE 11     Perceived Dyspnea  2     VO2 Peak 3.31     Symptoms Yes (comment)     Comments L knee pain 4/10, SOB, fatigue     Resting HR 62 bpm     Resting BP 104/58     Resting Oxygen Saturation  99 %     Exercise Oxygen Saturation  during 6 min walk 98 %     Max Ex. HR 90 bpm     Max Ex. BP 118/60     2 Minute Post BP 102/56              Oxygen Initial Assessment:   Oxygen Re-Evaluation:   Oxygen Discharge (Final Oxygen Re-Evaluation):   Initial Exercise Prescription:  Initial Exercise Prescription - 12/04/21 1000       Date of Initial Exercise RX and Referring Provider   Date 12/04/21    Referring Provider Lorelei Pont MD ([place])      Oxygen   Maintain Oxygen Saturation 88% or higher      Recumbant Bike   Level 1    RPM 60    Watts 15    Minutes 15    METs 1      NuStep   Level 1     SPM 80    Minutes 15    METs 1      REL-XR   Level 1    Speed 50    Minutes 15    METs 1      Track   Laps 10    Minutes 15    METs 1.54      Prescription Details   Frequency (times per week) 3    Duration Progress to 30 minutes of continuous aerobic without signs/symptoms of physical distress      Intensity   THRR 40-80% of Max Heartrate 94-127    Ratings of Perceived Exertion 11-13    Perceived Dyspnea 0-4      Progression   Progression Continue to progress workloads to maintain intensity without signs/symptoms of physical distress.      Resistance Training   Training Prescription Yes    Weight 3 lb    Reps 10-15             Perform Capillary Blood Glucose checks as needed.  Exercise Prescription Changes:   Exercise Prescription Changes     Row Name 12/04/21 1000 01/15/22 0900 01/28/22 1700         Response to Exercise   Blood Pressure (Admit) 104/58 122/64 114/64     Blood Pressure (  Exercise) 118/60 114/60 128/64     Blood Pressure (Exit) 102/56 108/58 100/60     Heart Rate (Admit) 62 bpm 68 bpm 60 bpm     Heart Rate (Exercise) 90 bpm 106 bpm 113 bpm     Heart Rate (Exit) 69 bpm 98 bpm 85 bpm     Oxygen Saturation (Admit) 99 % -- --     Oxygen Saturation (Exercise) 98 % -- --     Rating of Perceived Exertion (Exercise) 11 13 13      Perceived Dyspnea (Exercise) 2 -- --     Symptoms Left knee pain 4/10, SOB, Fatigue -- none     Comments walk test results -- --     Duration -- Progress to 30 minutes of  aerobic without signs/symptoms of physical distress Progress to 30 minutes of  aerobic without signs/symptoms of physical distress     Intensity -- THRR unchanged THRR unchanged       Progression   Progression -- Continue to progress workloads to maintain intensity without signs/symptoms of physical distress. Continue to progress workloads to maintain intensity without signs/symptoms of physical distress.     Average METs -- 2 1.84        Resistance Training   Training Prescription -- Yes Yes     Weight -- 3 lb 3 lb     Reps -- 10-15 10-15       Interval Training   Interval Training -- No No       NuStep   Level -- 4 4     SPM -- 80 --     Minutes -- 15 15     METs -- 2 2       REL-XR   Level -- -- 3     Minutes -- -- 15     METs -- -- 1.4       Track   Laps -- 18 18     Minutes -- 15 15     METs -- 1.98 1.95              Exercise Comments:   Exercise Comments     Row Name 12/19/21 1350           Exercise Comments First full day of exercise!  Patient was oriented to gym and equipment including functions, settings, policies, and procedures.  Patient's individual exercise prescription and treatment plan were reviewed.  All starting workloads were established based on the results of the 6 minute walk test done at initial orientation visit.  The plan for exercise progression was also introduced and progression will be customized based on patient's performance and goals.                Exercise Goals and Review:   Exercise Goals     Row Name 12/04/21 1043             Exercise Goals   Increase Physical Activity Yes       Intervention Provide advice, education, support and counseling about physical activity/exercise needs.;Develop an individualized exercise prescription for aerobic and resistive training based on initial evaluation findings, risk stratification, comorbidities and participant's personal goals.       Expected Outcomes Short Term: Attend rehab on a regular basis to increase amount of physical activity.;Long Term: Add in home exercise to make exercise part of routine and to increase amount of physical activity.;Long Term: Exercising regularly at least 3-5 days a week.  Increase Strength and Stamina Yes       Intervention Develop an individualized exercise prescription for aerobic and resistive training based on initial evaluation findings, risk stratification, comorbidities and  participant's personal goals.;Provide advice, education, support and counseling about physical activity/exercise needs.       Expected Outcomes Short Term: Increase workloads from initial exercise prescription for resistance, speed, and METs.;Short Term: Perform resistance training exercises routinely during rehab and add in resistance training at home;Long Term: Improve cardiorespiratory fitness, muscular endurance and strength as measured by increased METs and functional capacity (6MWT)       Able to understand and use rate of perceived exertion (RPE) scale Yes       Intervention Provide education and explanation on how to use RPE scale       Expected Outcomes Short Term: Able to use RPE daily in rehab to express subjective intensity level;Long Term:  Able to use RPE to guide intensity level when exercising independently       Able to understand and use Dyspnea scale Yes       Intervention Provide education and explanation on how to use Dyspnea scale       Expected Outcomes Short Term: Able to use Dyspnea scale daily in rehab to express subjective sense of shortness of breath during exertion;Long Term: Able to use Dyspnea scale to guide intensity level when exercising independently       Knowledge and understanding of Target Heart Rate Range (THRR) Yes       Intervention Provide education and explanation of THRR including how the numbers were predicted and where they are located for reference       Expected Outcomes Short Term: Able to state/look up THRR;Long Term: Able to use THRR to govern intensity when exercising independently;Short Term: Able to use daily as guideline for intensity in rehab       Able to check pulse independently Yes       Intervention Provide education and demonstration on how to check pulse in carotid and radial arteries.;Review the importance of being able to check your own pulse for safety during independent exercise       Expected Outcomes Short Term: Able to explain why  pulse checking is important during independent exercise;Long Term: Able to check pulse independently and accurately       Understanding of Exercise Prescription Yes       Intervention Provide education, explanation, and written materials on patient's individual exercise prescription       Expected Outcomes Short Term: Able to explain program exercise prescription;Long Term: Able to explain home exercise prescription to exercise independently                Exercise Goals Re-Evaluation :  Exercise Goals Re-Evaluation     Row Name 12/19/21 1350 01/07/22 1428 01/15/22 0902 01/28/22 1756       Exercise Goal Re-Evaluation   Exercise Goals Review Knowledge and understanding of Target Heart Rate Range (THRR);Understanding of Exercise Prescription;Able to understand and use rate of perceived exertion (RPE) scale;Increase Physical Activity;Able to understand and use Dyspnea scale;Able to check pulse independently;Increase Strength and Stamina Increase Physical Activity;Increase Strength and Stamina Increase Physical Activity;Increase Strength and Stamina Increase Physical Activity;Increase Strength and Stamina    Comments Reviewed RPE and dyspnea scales, THR and program prescription with pt today.  Pt voiced understanding and was given a copy of goals to take home. Patient states that he is working out at home about 3 days a  week. He likes to work on Scientist, water quality. He balances when he is in the kitchen and can hold onto something. Joshua Berry is progressing well and is up to level 4 on NS and has reached 18 laps walking the track.  We will continue to monitor progress. Joshua Berry has had inconsistent attendance and has not exercised that much at rehab to show improvement since last review. Staff will continue to follow up with patient and encourage more consistent attendance to yield better results.    Expected Outcomes Short: Use RPE daily to regulate intensity. Long: Follow program prescription in THR.  Short: imporove levels and distance walked. Long: maintain walking and strength training independently Short: attend consistently Long: continue to build stamina Short: Improve attendance Long: Continue to increase overall MET level             Discharge Exercise Prescription (Final Exercise Prescription Changes):  Exercise Prescription Changes - 01/28/22 1700       Response to Exercise   Blood Pressure (Admit) 114/64    Blood Pressure (Exercise) 128/64    Blood Pressure (Exit) 100/60    Heart Rate (Admit) 60 bpm    Heart Rate (Exercise) 113 bpm    Heart Rate (Exit) 85 bpm    Rating of Perceived Exertion (Exercise) 13    Symptoms none    Duration Progress to 30 minutes of  aerobic without signs/symptoms of physical distress    Intensity THRR unchanged      Progression   Progression Continue to progress workloads to maintain intensity without signs/symptoms of physical distress.    Average METs 1.84      Resistance Training   Training Prescription Yes    Weight 3 lb    Reps 10-15      Interval Training   Interval Training No      NuStep   Level 4    Minutes 15    METs 2      REL-XR   Level 3    Minutes 15    METs 1.4      Track   Laps 18    Minutes 15    METs 1.95             Nutrition:  Target Goals: Understanding of nutrition guidelines, daily intake of sodium <155m, cholesterol <2056m calories 30% from fat and 7% or less from saturated fats, daily to have 5 or more servings of fruits and vegetables.  Education: All About Nutrition: -Group instruction provided by verbal, written material, interactive activities, discussions, models, and posters to present general guidelines for heart healthy nutrition including fat, fiber, MyPlate, the role of sodium in heart healthy nutrition, utilization of the nutrition label, and utilization of this knowledge for meal planning. Follow up email sent as well. Written material given at graduation. Flowsheet Row  [facility]  Education need identified 12/04/21       Biometrics:  Pre Biometrics - 12/04/21 1029       Pre Biometrics   Height 5' 11.75" (1.822 m)    Weight 234 lb 12.8 oz (106.5 kg)    BMI (Calculated) 32.08    Single Leg Stand 0 seconds              Nutrition Therapy Plan and Nutrition Goals:  Nutrition Therapy & Goals - 01/17/22 1306       Nutrition Therapy   Diet Heart healthy, low Na, T2DM MNT  Drug/Food Interactions Statins/Certain Fruits    Protein (specify units) 80g    Fiber 30 grams    Whole Grain Foods 3 servings    Saturated Fats 12 max. grams    Fruits and Vegetables 8 servings/day    Sodium 2 grams      Personal Nutrition Goals   Nutrition Goal ST: increase vegetable variety, monitor CHO amount per meal and snack (1-2 CHO snacks and 2-4 CHO meals), and swapping out breakfast with making an omelette in a mug.  LT: maintain A1C ~7, limit Na <2g    Comments 76 y.o. M admitted to Cardiac Rehab s/p TAVR. Prsents with T2DM, hepatic encephalopathy, cirrhosis, HLD, hypothyroid.  Relavent medications include lipitor, vitamin C, vitamin D, folvite, lasix, novolog, lantus, lactulose, fish oil, probiotic PYP Score: 70. Vegetables & Fruits 11/12. Breads, Grains & Cereals 8/12. Red & Processed Meat 8/12. Poultry 0/2. Fish & Shellfish 2/4. Beans, Nuts & Seeds 0/4. Milk & Dairy Foods 5/6. Toppings, Oils, Seasonings & Salt 16/20. Sweets, Snacks & Restaurant Food 10/14. Beverages 10/10. Daughter Lattie Haw reports that he has made changes before, but has fallen off of that for the most part as his major barriers are shopping and cooking. His daughter is a caretaker for his wife and himself. He reports being educated in the past about nutrition and specifically diabetes. He reports eating mostly vegetable and fish and poultry. B: just add and egg or sausage bowl L: hamburger patty with apples and "sauce", roll. Lasagna. Fish or  chicken. Cafeteria meals that they get delivered to him S: saurkraut D: meat and vegetable (small salads, coleslaw, spinach, tomatoes) and starch. Does not use fat with cooking, does not add salt - he uses spices that do not have sodium. Drinks: water, tea - unsweet, coffee (creamer with low calories and low sugar), diet coke every once and a while, he will use mio to water sometimes. He likes fruit, but tries to avoid it; discussed T2DM MNT including CHO amount, quality, and pairing with protein, fiber, and fat. Joshua Berry was open to changes in variety, CHO monitoring, and swapping out breakfast with making an omelette in a mug.      Intervention Plan   Intervention Prescribe, educate and counsel regarding individualized specific dietary modifications aiming towards targeted core components such as weight, hypertension, lipid management, diabetes, heart failure and other comorbidities.    Expected Outcomes Short Term Goal: Understand basic principles of dietary content, such as calories, fat, sodium, cholesterol and nutrients.;Short Term Goal: A plan has been developed with personal nutrition goals set during dietitian appointment.;Long Term Goal: Adherence to prescribed nutrition plan.             Nutrition Assessments:  MEDIFICTS Score Key: ?70 Need to make dietary changes  40-70 Heart Healthy Diet ? 40 Therapeutic Level Cholesterol Diet  Flowsheet Row Cardiac Rehab from 12/04/2021 in [facility] Cardiac and Pulmonary Rehab  Picture Your Plate Total Score on Admission 70      Picture Your Plate Scores: <31 Unhealthy dietary pattern with much room for improvement. 41-50 Dietary pattern unlikely to meet recommendations for good health and room for improvement. 51-60 More healthful dietary pattern, with some room for improvement.  >60 Healthy dietary pattern, although there may be some specific behaviors that could be improved.    Nutrition Goals Re-Evaluation:  Nutrition Goals Re-Evaluation      Joshua Berry Name 01/07/22 1431 02/04/22 1353           Goals   Current Weight --  234 lb (106.1 kg)      Nutrition Goal Lose some weight. Lose some weight      Comment Patient is meeting with the dietitian to talk about his eating habits. Patient has been eating more fish and chicken. He has been eating more vegetables as well. He has modified his eating in a way he does not over eat/      Expected Outcome Short: meet with dietitian. Long: maintain eating habits independently Short: eat a balanced diet. Long: maintain a diet that perains to him.               Nutrition Goals Discharge (Final Nutrition Goals Re-Evaluation):  Nutrition Goals Re-Evaluation - 02/04/22 1353       Goals   Current Weight 234 lb (106.1 kg)    Nutrition Goal Lose some weight    Comment Patient has been eating more fish and chicken. He has been eating more vegetables as well. He has modified his eating in a way he does not over eat/    Expected Outcome Short: eat a balanced diet. Long: maintain a diet that perains to him.             Psychosocial: Target Goals: Acknowledge presence or absence of significant depression and/or stress, maximize coping skills, provide positive support system. Participant is able to verbalize types and ability to use techniques and skills needed for reducing stress and depression.   Education: Stress, Anxiety, and Depression - Group verbal and visual presentation to define topics covered.  Reviews how body is impacted by stress, anxiety, and depression.  Also discusses healthy ways to reduce stress and to treat/manage anxiety and depression.  Written material given at graduation.   Education: Sleep Hygiene -Provides group verbal and written instruction about how sleep can affect your health.  Define sleep hygiene, discuss sleep cycles and impact of sleep habits. Review good sleep hygiene tips.    Initial Review & Psychosocial Screening:  Initial Psych Review & Screening -  11/16/21 1023       Initial Review   Current issues with Current Psychotropic Meds;Current Sleep Concerns      Family Dynamics   Good Support System? Yes    Comments Joshua Berry takes melatonin at night and is out right now so it has been hard for him to sleep. He can look to his wife for mental support, daughter, son and a health care aid.      Barriers   Psychosocial barriers to participate in program The patient should benefit from training in stress management and relaxation.;Psychosocial barriers identified (see note)      Screening Interventions   Interventions Encouraged to exercise;To provide support and resources with identified psychosocial needs;Provide feedback about the scores to participant    Expected Outcomes Short Term goal: Utilizing psychosocial counselor, staff and physician to assist with identification of specific Stressors or current issues interfering with healing process. Setting desired goal for each stressor or current issue identified.;Long Term Goal: Stressors or current issues are controlled or eliminated.;Short Term goal: Identification and review with participant of any Quality of Life or Depression concerns found by scoring the questionnaire.;Long Term goal: The participant improves quality of Life and PHQ9 Scores as seen by post scores and/or verbalization of changes             Quality of Life Scores:   Quality of Life - 12/04/21 1029       Quality of Life   Select Quality of Life  Quality of Life Scores   Health/Function Pre 19 %    Socioeconomic Pre 22.5 %    Psych/Spiritual Pre 25.93 %    Family Pre 20 %    GLOBAL Pre 21.29 %            Scores of 19 and below usually indicate a poorer quality of life in these areas.  A difference of  2-3 points is a clinically meaningful difference.  A difference of 2-3 points in the total score of the Quality of Life Index has been associated with significant improvement in overall quality of life,  self-image, physical symptoms, and general health in studies assessing change in quality of life.  PHQ-9: Recent Review Flowsheet Data     Depression screen [facility] 2/9 01/07/2022 12/04/2021   Decreased Interest 1 2   Down, Depressed, Hopeless 0 1   PHQ - 2 Score 1 3   Altered sleeping 1 2   Tired, decreased energy 1 2   Change in appetite 1 1   Feeling bad or failure about yourself  0 0   Trouble concentrating - 0   Moving slowly or fidgety/restless 0 1   Suicidal thoughts 0 0   PHQ-9 Score 4 9   Difficult doing work/chores Not difficult at all Very difficult      Interpretation of Total Score  Total Score Depression Severity:  1-4 = Minimal depression, 5-9 = Mild depression, 10-14 = Moderate depression, 15-19 = Moderately severe depression, 20-27 = Severe depression   Psychosocial Evaluation and Intervention:  Psychosocial Evaluation - 11/16/21 1025       Psychosocial Evaluation & Interventions   Interventions Encouraged to exercise with the program and follow exercise prescription;Relaxation education;Stress management education    Comments Joshua Berry takes melatonin at night and is out right now so it has been hard for him to sleep. He can look to his wife for mental support, daughter, son and a health care aid.    Expected Outcomes Short: Start [facility] to help with mood. Long: Maintain a healthy mental state    Continue Psychosocial Services  Follow up required by staff             Psychosocial Re-Evaluation:  Psychosocial Re-Evaluation     [place] Name 01/07/22 1424 02/04/22 1358           Psychosocial Re-Evaluation   Current issues with None Identified None Identified      Comments Reviewed patient health questionnaire (PHQ-9) with patient for follow up. Previously, patients score indicated signs/symptoms of depression.  Reviewed to see if patient is improving symptom wise while in program.  Score improved and patient states that it is because he can exercise more.  Patient reports no issues with their current mental states, sleep, stress, depression or anxiety. Will follow up with patient in a few weeks for any changes.      Expected Outcomes Short: Continue to attend [facility] regularly for regular exercise and social engagement. Long: Continue to improve symptoms and manage a positive mental state. Short: Continue to exercise regularly to support mental health and notify staff of any changes. Long: maintain mental health and well being through teaching of rehab or prescribed medications independently.      Interventions Encouraged to attend Cardiac Rehabilitation for the exercise Encouraged to attend Cardiac Rehabilitation for the exercise      Continue Psychosocial Services  Follow up required by staff Follow up required by staff  Psychosocial Discharge (Final Psychosocial Re-Evaluation):  Psychosocial Re-Evaluation - 02/04/22 1358       Psychosocial Re-Evaluation   Current issues with None Identified    Comments Patient reports no issues with their current mental states, sleep, stress, depression or anxiety. Will follow up with patient in a few weeks for any changes.    Expected Outcomes Short: Continue to exercise regularly to support mental health and notify staff of any changes. Long: maintain mental health and well being through teaching of rehab or prescribed medications independently.    Interventions Encouraged to attend Cardiac Rehabilitation for the exercise    Continue Psychosocial Services  Follow up required by staff             Vocational Rehabilitation: Provide vocational rehab assistance to qualifying candidates.   Vocational Rehab Evaluation & Intervention:   Education: Education Goals: Education classes will be provided on a variety of topics geared toward better understanding of heart health and risk factor modification. Participant will state understanding/return demonstration of topics presented  as noted by education test scores.  Learning Barriers/Preferences:  Learning Barriers/Preferences - 11/16/21 1021       Learning Barriers/Preferences   Learning Barriers None    Learning Preferences None             General Cardiac Education Topics:  AED/CPR: - Group verbal and written instruction with the use of models to demonstrate the basic use of the AED with the basic ABC's of resuscitation.   Anatomy and Cardiac Procedures: - Group verbal and visual presentation and models provide information about basic cardiac anatomy and function. Reviews the testing methods done to diagnose heart disease and the outcomes of the test results. Describes the treatment choices: Medical Management, Angioplasty, or Coronary Bypass Surgery for treating various heart conditions including Myocardial Infarction, Angina, Valve Disease, and Cardiac Arrhythmias.  Written material given at graduation.   Medication Safety: - Group verbal and visual instruction to review commonly prescribed medications for heart and lung disease. Reviews the medication, class of the drug, and side effects. Includes the steps to properly store meds and maintain the prescription regimen.  Written material given at graduation.   Intimacy: - Group verbal instruction through game format to discuss how heart and lung disease can affect sexual intimacy. Written material given at graduation..   Know Your Numbers and Heart Failure: - Group verbal and visual instruction to discuss disease risk factors for cardiac and pulmonary disease and treatment options.  Reviews associated critical values for Overweight/Obesity, Hypertension, Cholesterol, and Diabetes.  Discusses basics of heart failure: signs/symptoms and treatments.  Introduces Heart Failure Zone chart for action plan for heart failure.  Written material given at graduation.   Infection Prevention: - Provides verbal and written material to individual with discussion of  infection control including proper hand washing and proper equipment cleaning during exercise session. Flowsheet Row Cardiac Rehab from 11/16/2021 in [facility] Cardiac and Pulmonary Rehab  Date 11/16/21  Educator [facility]  Instruction Review Code 1- Verbalizes Understanding       Falls Prevention: - Provides verbal and written material to individual with discussion of falls prevention and safety. Flowsheet Row Cardiac Rehab from 11/16/2021 in [facility] Cardiac and Pulmonary Rehab  Date 11/16/21  Educator [facility]  Instruction Review Code 1- Verbalizes Understanding       Other: -Provides group and verbal instruction on various topics (see comments)   Knowledge Questionnaire Score:  Knowledge Questionnaire Score - 12/04/21 1018  Knowledge Questionnaire Score   Pre Score 22/26: Exercise, Nutrition             Core Components/Risk Factors/Patient Goals at Admission:  Personal Goals and Risk Factors at Admission - 12/04/21 1043       Core Components/Risk Factors/Patient Goals on Admission    Weight Management Yes;Weight Loss   patient would like to maintain   Intervention Weight Management: Develop a combined nutrition and exercise program designed to reach desired caloric intake, while maintaining appropriate intake of nutrient and fiber, sodium and fats, and appropriate energy expenditure required for the weight goal.;Weight Management/Obesity: Establish reasonable short term and long term weight goals.;Weight Management: Provide education and appropriate resources to help participant work on and attain dietary goals.    Admit Weight 234 lb (106.1 kg)    Goal Weight: Short Term 230 lb (104.3 kg)    Goal Weight: Long Term 227 lb (103 kg)    Expected Outcomes Short Term: Continue to assess and modify interventions until short term weight is achieved;Long Term: Adherence to nutrition and physical activity/exercise program aimed toward attainment of established weight goal;Weight Loss:  Understanding of general recommendations for a balanced deficit meal plan, which promotes 1-2 lb weight loss per week and includes a negative energy balance of [phone] kcal/d;Understanding recommendations for meals to include 15-35% energy as protein, 25-35% energy from fat, 35-60% energy from carbohydrates, less than 244m of dietary cholesterol, 20-35 gm of total fiber daily;Understanding of distribution of calorie intake throughout the day with the consumption of 4-5 meals/snacks    Diabetes Yes    Intervention Provide education about signs/symptoms and action to take for hypo/hyperglycemia.;Provide education about proper nutrition, including hydration, and aerobic/resistive exercise prescription along with prescribed medications to achieve blood glucose in normal ranges: Fasting glucose 65-99 mg/dL    Expected Outcomes Short Term: Participant verbalizes understanding of the signs/symptoms and immediate care of hyper/hypoglycemia, proper foot care and importance of medication, aerobic/resistive exercise and nutrition plan for blood glucose control.;Long Term: Attainment of HbA1C < 7%.    Lipids Yes    Intervention Provide education and support for participant on nutrition & aerobic/resistive exercise along with prescribed medications to achieve LDL <766m HDL >4013m   Expected Outcomes Short Term: Participant states understanding of desired cholesterol values and is compliant with medications prescribed. Participant is following exercise prescription and nutrition guidelines.;Long Term: Cholesterol controlled with medications as prescribed, with individualized exercise RX and with personalized nutrition plan. Value goals: LDL < 23m81mDL > 40 mg.             Education:Diabetes - Individual verbal and written instruction to review signs/symptoms of diabetes, desired ranges of glucose level fasting, after meals and with exercise. Acknowledge that pre and post exercise glucose checks will be done  for 3 sessions at entry of program. [facility]  Date 11/16/21  [facility] Code 1- Verbalizes Understanding       Core Components/Risk Factors/Patient Goals Review:   Goals and Risk Factor Review     Row Name 01/07/22 1426 02/04/22 1348           Core Components/Risk Factors/Patient Goals Review   Personal Goals Review Weight Management/Obesity Weight Management/Obesity      Review Joshua Berry that he wants to lose some weight. He would like to be around 220 pounds. He is doing well in rehab and seems to be improving on his stamina.  Joshua Berry has not lost any weight since his last follow up. He has had a precedure last week to stop bleeding ulcers. His doctor had to cauterize the lining of his stomach to help with the bleeding. He is taking antibiotics currently.      Expected Outcomes Short: lose some weight. Long: reach weight goal. Short: take care and use medications  for his stomach. Long; Stop is stomach bleed entirly.               Core Components/Risk Factors/Patient Goals at Discharge (Final Review):   Goals and Risk Factor Review - 02/04/22 1348       Core Components/Risk Factors/Patient Goals Review   Personal Goals Review Weight Management/Obesity    Review Joshua Berry has not lost any weight since his last follow up. He has had a precedure last week to stop bleeding ulcers. His doctor had to cauterize the lining of his stomach to help with the bleeding. He is taking antibiotics currently.    Expected Outcomes Short: take care and use medications  for his stomach. Long; Stop is stomach bleed entirly.             ITP Comments:  ITP Comments     Row Name 11/16/21 1028 12/04/21 1017 12/12/21 1114 12/19/21 1350 01/09/22 0921   ITP Comments Virtual Visit completed. Patient informed on EP and RD appointment and 6 Minute walk test. Patient also informed of patient health questionnaires on  My Chart. Patient Verbalizes understanding. Visit diagnosis can be found in [facility] 09/02/2021. Completed 6MWT and gym orientation. Initial ITP created and sent for review to Dr. Emily Filbert, Medical Director. 30 Day review completed. Medical Director ITP review done, changes made as directed, and signed approval by Medical Director.    New to program First full day of exercise!  Patient was oriented to gym and equipment including functions, settings, policies, and procedures.  Patient's individual exercise prescription and treatment plan were reviewed.  All starting workloads were established based on the results of the 6 minute walk test done at initial orientation visit.  The plan for exercise progression was also introduced and progression will be customized based on patient's performance and goals. 30 Day review completed. Medical Director ITP review done, changes made as directed, and signed approval by Medical Director.    [place] Name 01/17/22 1416 02/06/22 0918         ITP Comments Completed Initial RD Consultation 30 Day review completed. Medical Director ITP review done, changes made as directed, and signed approval by Medical Director.               Comments:

## 2022-02-13 ENCOUNTER — Other Ambulatory Visit: Payer: Self-pay

## 2022-02-13 ENCOUNTER — Encounter: Payer: No Typology Code available for payment source | Attending: Internal Medicine

## 2022-02-13 DIAGNOSIS — Z952 Presence of prosthetic heart valve: Secondary | ICD-10-CM | POA: Diagnosis not present

## 2022-02-13 NOTE — Progress Notes (Signed)
Daily Session Note ? ?Patient Details  ?Name: Joshua Berry ?MRN: [id number] ?Date of Birth: April 01, 1946 ?Referring Provider:   ?Flowsheet Row Cardiac Rehab from 12/04/2021 in [facility] Cardiac and Pulmonary Rehab  ?Referring Provider Lorelei Pont MD ([place])  ? ?  ? ? ?Encounter Date: 02/13/2022 ? ?Check In: ? Session Check In - 02/13/22 1331   ? ?  ? Check-In  ? Supervising physician immediately available to respond to emergencies See telemetry face sheet for immediately available ER MD   ? Location [facility]-Cardiac & Pulmonary Rehab   ? Staff Present Birdie Sons, MPA, Nino Glow, MS, ASCM CEP, Exercise Physiologist;Joseph [place], [place]   ? Virtual Visit No   ? Medication changes reported     No   ? Fall or balance concerns reported    No   ? Tobacco Cessation No Change   ? Warm-up and Cool-down Performed on first and last piece of equipment   ? Resistance Training Performed Yes   ? VAD Patient? No   ? PAD/SET Patient? No   ?  ? Pain Assessment  ? Currently in Pain? No/denies   ? ?  ?  ? ?  ? ? ? ? ? ?Social History  ? ?Tobacco Use  ?Smoking Status Former  ? Packs/day: 0.50  ? Years: 5.00  ? Pack years: 2.50  ? Types: Cigarettes  ? Quit date: 10/16/1968  ? Years since quitting: 53.3  ?Smokeless Tobacco Never  ? ? ?Goals Met:  ?Independence with exercise equipment ?Exercise tolerated well ?No report of concerns or symptoms today ?Strength training completed today ? ?Goals Unmet:  ?Not Applicable ? ?Comments: Pt able to follow exercise prescription today without complaint.  Will continue to monitor for progression. ? ? ? ?Dr. Emily Filbert is Medical Director for [place].  ?Dr. Ottie Glazier is Medical Director for [facility] Pulmonary Rehabilitation. ?

## 2022-02-18 ENCOUNTER — Other Ambulatory Visit: Payer: Self-pay

## 2022-02-18 DIAGNOSIS — Z952 Presence of prosthetic heart valve: Secondary | ICD-10-CM | POA: Diagnosis not present

## 2022-02-18 NOTE — Progress Notes (Signed)
Daily Session Note ? ?Patient Details  ?Name: Joshua Berry ?MRN: [id number] ?Date of Birth: 1946/11/11 ?Referring Provider:   ?Flowsheet Row Cardiac Rehab from 12/04/2021 in [facility] Cardiac and Pulmonary Rehab  ?Referring Provider Lorelei Pont MD ([place])  ? ?  ? ? ?Encounter Date: 02/18/2022 ? ?Check In: ? Session Check In - 02/18/22 1426   ? ?  ? Check-In  ? Supervising physician immediately available to respond to emergencies See telemetry face sheet for immediately available ER MD   ? Location [facility]-Cardiac & Pulmonary Rehab   ? Staff Present Birdie Sons, MPA, RN;Joseph [place], RCP,RRT,BSRT;Kara [place]lyn, MS, ASCM CEP, Exercise Physiologist   ? Virtual Visit No   ? Medication changes reported     No   ? Fall or balance concerns reported    No   ? Tobacco Cessation No Change   ? Warm-up and Cool-down Performed on first and last piece of equipment   ? Resistance Training Performed Yes   ? VAD Patient? No   ? PAD/SET Patient? No   ?  ? Pain Assessment  ? Currently in Pain? No/denies   ? ?  ?  ? ?  ? ? ? ? ? ?Social History  ? ?Tobacco Use  ?Smoking Status Former  ? Packs/day: 0.50  ? Years: 5.00  ? Pack years: 2.50  ? Types: Cigarettes  ? Quit date: 10/16/1968  ? Years since quitting: 53.3  ?Smokeless Tobacco Never  ? ? ?Goals Met:  ?Independence with exercise equipment ?Exercise tolerated well ?No report of concerns or symptoms today ?Strength training completed today ? ?Goals Unmet:  ?Not Applicable ? ?Comments: Pt able to follow exercise prescription today without complaint.  Will continue to monitor for progression. ? ? ? ?Dr. Emily Filbert is Medical Director for [place].  ?Dr. Ottie Glazier is Medical Director for [facility] Pulmonary Rehabilitation. ?

## 2022-02-21 ENCOUNTER — Encounter: Payer: No Typology Code available for payment source | Admitting: *Deleted

## 2022-02-21 ENCOUNTER — Other Ambulatory Visit: Payer: Self-pay

## 2022-02-21 DIAGNOSIS — Z952 Presence of prosthetic heart valve: Secondary | ICD-10-CM

## 2022-02-21 NOTE — Progress Notes (Signed)
Daily Session Note ? ?Patient Details  ?Name: Joshua Berry ?MRN: [id number] ?Date of Birth: 1946/12/09 ?Referring Provider:   ?Flowsheet Row Cardiac Rehab from 12/04/2021 in [facility] Cardiac and Pulmonary Rehab  ?Referring Provider Lorelei Pont MD ([place])  ? ?  ? ? ?Encounter Date: 02/21/2022 ? ?Check In: ? Session Check In - 02/21/22 1343   ? ?  ? Check-In  ? Supervising physician immediately available to respond to emergencies See telemetry face sheet for immediately available ER MD   ? Location [facility]-Cardiac & Pulmonary Rehab   ? Staff Present Renita Papa, RN BSN;Joseph Girard, RCP,RRT,BSRT;Jessica [place], [place], [place], [place], CCET   ? Virtual Visit No   ? Medication changes reported     No   ? Fall or balance concerns reported    No   ? Warm-up and Cool-down Performed on first and last piece of equipment   ? Resistance Training Performed Yes   ? VAD Patient? No   ? PAD/SET Patient? No   ?  ? Pain Assessment  ? Currently in Pain? No/denies   ? ?  ?  ? ?  ? ? ? ? ? ?Social History  ? ?Tobacco Use  ?Smoking Status Former  ? Packs/day: 0.50  ? Years: 5.00  ? Pack years: 2.50  ? Types: Cigarettes  ? Quit date: 10/16/1968  ? Years since quitting: 53.3  ?Smokeless Tobacco Never  ? ? ?Goals Met:  ?Independence with exercise equipment ?Exercise tolerated well ?No report of concerns or symptoms today ?Strength training completed today ? ?Goals Unmet:  ?Not Applicable ? ?Comments: Pt able to follow exercise prescription today without complaint.  Will continue to monitor for progression. ? ? ? ?Dr. Emily Filbert is Medical Director for [place].  ?Dr. Ottie Glazier is Medical Director for [facility] Pulmonary Rehabilitation. ?

## 2022-03-06 ENCOUNTER — Other Ambulatory Visit: Payer: Self-pay

## 2022-03-06 ENCOUNTER — Encounter: Payer: Self-pay | Admitting: *Deleted

## 2022-03-06 DIAGNOSIS — Z952 Presence of prosthetic heart valve: Secondary | ICD-10-CM

## 2022-03-06 NOTE — Progress Notes (Signed)
Daily Session Note ? ?Patient Details  ?Name: Joshua Berry ?MRN: [id number] ?Date of Birth: 1946/11/17 ?Referring Provider:   ?Flowsheet Row Cardiac Rehab from 12/04/2021 in [facility] Cardiac and Pulmonary Rehab  ?Referring Provider Lorelei Pont MD ([place])  ? ?  ? ? ?Encounter Date: 03/06/2022 ? ?Check In: ? Session Check In - 03/06/22 1338   ? ?  ? Check-In  ? Supervising physician immediately available to respond to emergencies See telemetry face sheet for immediately available ER MD   ? Location [facility]-Cardiac & Pulmonary Rehab   ? Staff Present Birdie Sons, MPA, RN;Joseph [place], RCP,RRT,BSRT;Kara [place], MS, ASCM CEP, Exercise Physiologist   ? Virtual Visit No   ? Medication changes reported     No   ? Fall or balance concerns reported    No   ? Tobacco Cessation No Change   ? Warm-up and Cool-down Performed on first and last piece of equipment   ? Resistance Training Performed Yes   ? VAD Patient? No   ? PAD/SET Patient? No   ?  ? Pain Assessment  ? Currently in Pain? No/denies   ? ?  ?  ? ?  ? ? ? ? ? ?Social History  ? ?Tobacco Use  ?Smoking Status Former  ? Packs/day: 0.50  ? Years: 5.00  ? Pack years: 2.50  ? Types: Cigarettes  ? Quit date: 10/16/1968  ? Years since quitting: 53.4  ?Smokeless Tobacco Never  ? ? ?Goals Met:  ?Independence with exercise equipment ?Exercise tolerated well ?No report of concerns or symptoms today ?Strength training completed today ? ?Goals Unmet:  ?Not Applicable ? ?Comments: Pt able to follow exercise prescription today without complaint.  Will continue to monitor for progression. ?Reviewed home exercise with pt today.  Pt plans to walk and do PT for exercise.  Reviewed THR, pulse, RPE, sign and symptoms, pulse oximetery and when to call 911 or MD.  Also discussed weather considerations and indoor options.  Pt voiced understanding. ? ? ? ?Dr. Emily Filbert is Medical Director for [place].  ?Dr. Ottie Glazier is Medical Director for [facility] Pulmonary  Rehabilitation. ?

## 2022-03-06 NOTE — Progress Notes (Signed)
Cardiac Individual Treatment Plan ? ?Patient Details  ?Name: Joshua Berry ?MRN: [id number] ?Date of Birth: 12/05/1946 ?Referring Provider:   ?Flowsheet Row Cardiac Rehab from 12/04/2021 in [facility] Cardiac and Pulmonary Rehab  ?Referring Provider Lorelei Pont MD ([place])  ? ?  ? ? ?Initial Encounter Date:  ?Flowsheet Row Cardiac Rehab from 12/04/2021 in [facility] Cardiac and Pulmonary Rehab  ?Date 12/04/21  ? ?  ? ? ?Visit Diagnosis: S/P TAVR (transcatheter aortic valve replacement) ? ?Patient's Home Medications on Admission: ? ?Current Outpatient Medications:  ?  albuterol (VENTOLIN HFA) 108 (90 Base) MCG/ACT inhaler, Inhale 1-2 puffs into the lungs every 6 (six) hours as needed for wheezing or shortness of breath. (Patient not taking: Reported on 11/16/2021), Disp: , Rfl:  ?  albuterol (VENTOLIN HFA) 108 (90 Base) MCG/ACT inhaler, Inhale into the lungs., Disp: , Rfl:  ?  amoxicillin (AMOXIL) 500 MG capsule, TAKE FOUR CAPSULES BY MOUTH AS DIRECTED BY YOUR PROVIDER 30 TO 60 MINUTES PRIOR TO PROCEDURES., Disp: , Rfl:  ?  ascorbic acid (VITAMIN C) 500 MG tablet, Take 2 tablets by mouth daily., Disp: , Rfl:  ?  atorvastatin (LIPITOR) 10 MG tablet, Take 20 mg by mouth daily., Disp: , Rfl:  ?  augmented betamethasone dipropionate (DIPROLENE-AF) 0.05 % cream, Apply 1 application topically 2 (two) times daily., Disp: , Rfl:  ?  carboxymethylcellulose (REFRESH PLUS) 0.5 % SOLN, 1 drop at bedtime., Disp: , Rfl:  ?  Carboxymethylcellulose Sodium 1 % GEL, APPLY 1 DROP TO EACH EYE AT BEDTIME (Patient not taking: Reported on 11/16/2021), Disp: , Rfl:  ?  cholecalciferol (VITAMIN D) 25 MCG (1000 UT) tablet, Take 1,000 Units by mouth 2 (two) times daily.  (Patient not taking: Reported on 11/16/2021), Disp: , Rfl:  ?  Cholecalciferol 50 MCG (2000 UT) TABS, Take 1 tablet by mouth daily., Disp: , Rfl:  ?  famotidine (PEPCID) 40 MG tablet, Take by mouth., Disp: , Rfl:  ?  ferrous sulfate 325 (65 FE) MG tablet, Take 325 mg by mouth daily with  breakfast. (Patient not taking: Reported on 11/16/2021), Disp: , Rfl:  ?  fluorouracil (EFUDEX) 5 % cream, APPLY AS DIRECTED TO AFFECTED AREA AS DIRECTED START OCT OR JAN. APPLY 4-6 WEEKS AS TOLERATED; [place], PAT DRY, DO NOT RUB; TREAT: SCALP/FACE/EARS/NECK/ARMS; DO NOT APPLY NEAR EYES, NOSTRILS, MOUTH; [place] HANDS AFTERWARDS; APPLY MOISTURIZER EVERY 2 HOURS WHEN AWAKE; AVOID SUN. SEE HANDOUT, Disp: , Rfl:  ?  fluticasone (FLONASE) 50 MCG/ACT nasal spray, Place 1 spray into both nostrils daily., Disp: , Rfl:  ?  folic acid (FOLVITE) 1 MG tablet, Take 1 mg by mouth at bedtime. (Patient not taking: Reported on 11/16/2021), Disp: , Rfl:  ?  folic acid (FOLVITE) 1 MG tablet, Take 1 tablet by mouth daily., Disp: , Rfl:  ?  furosemide (LASIX) 20 MG tablet, Take by mouth., Disp: , Rfl:  ?  furosemide (LASIX) 40 MG tablet, Take 1 tablet (40 mg total) by mouth daily., Disp: 30 tablet, Rfl: 0 ?  Glycerin-Hypromellose-PEG 400 0.2-0.2-1 % SOLN, Apply 1 drop to eye 4 (four) times daily., Disp: , Rfl:  ?  insulin aspart (NOVOLOG) 100 UNIT/ML FlexPen, Inject into the skin., Disp: , Rfl:  ?  insulin aspart (NOVOLOG) 100 UNIT/ML injection, Inject 10 Units into the skin 3 (three) times daily before meals. (Patient not taking: Reported on 11/16/2021), Disp: 10 mL, Rfl: 11 ?  insulin glargine (LANTUS) 100 UNIT/ML injection, Inject 0.16 mLs (16 Units total) into the  skin daily. (Patient not taking: Reported on 11/16/2021), Disp: 10 mL, Rfl: 11 ?  insulin glargine (LANTUS) 100 UNIT/ML Solostar Pen, Inject into the skin., Disp: , Rfl:  ?  insulin glargine-yfgn (SEMGLEE) 100 UNIT/ML Pen, INJECT 14 UNITS SUBCUTANEOUSLY DAILY (USE WITHIN 28 DAYS AFTER OPENING PEN) (Patient not taking: Reported on 11/16/2021), Disp: , Rfl:  ?  ipratropium-albuterol (DUONEB) 0.5-2.5 (3) MG/3ML SOLN, Take 3 mLs by nebulization every 4 (four) hours as needed (shortness of breath).  (Patient not taking: Reported on 11/16/2021), Disp: , Rfl:  ?  ketotifen (ZADITOR) 0.025  % ophthalmic solution, Place 1 drop into both eyes 2 (two) times daily. (Patient not taking: Reported on 11/16/2021), Disp: , Rfl:  ?  ketotifen (ZADITOR) 0.025 % ophthalmic solution, Place 1 drop into both eyes as needed (for allergies)., Disp: , Rfl:  ?  Lactobacillus Rhamnosus, GG, (RA PROBIOTIC DIGESTIVE CARE) CAPS, Take 1 capsule by mouth daily., Disp: , Rfl:  ?  lactulose (CHRONULAC) 10 GM/15ML solution, Take 10 g by mouth 3 (three) times daily. (Patient not taking: Reported on 11/16/2021), Disp: , Rfl:  ?  lactulose (CHRONULAC) 10 GM/15ML solution, Take by mouth., Disp: , Rfl:  ?  levothyroxine (SYNTHROID) 100 MCG tablet, TAKE ONE TABLET BY MOUTH DAILY ON AN EMPTY STOMACH, Disp: , Rfl:  ?  levothyroxine (SYNTHROID, LEVOTHROID) 100 MCG tablet, Take 100 mcg by mouth daily before breakfast. (Patient not taking: Reported on 11/16/2021), Disp: , Rfl:  ?  Lidocaine 4 % PTCH, Place onto the skin., Disp: , Rfl:  ?  MAGNESIUM GLUCONATE PO, Take 420 mg by mouth daily. (Patient not taking: Reported on 11/16/2021), Disp: , Rfl:  ?  Magnesium Oxide 420 MG TABS, Take 2 tablets by mouth 2 (two) times daily., Disp: , Rfl:  ?  octreotide (SANDOSTATIN LAR) 20 MG injection, INJECT 1 VIAL (20MG) INTRAMUSCULARLY EVERY 28 DAYS (FOR INFUSION CLINIC), Disp: , Rfl:  ?  Omega-3 Fatty Acids (FISH OIL) 1000 MG CAPS, Take 1 capsule by mouth 2 (two) times daily., Disp: , Rfl:  ?  pantoprazole (PROTONIX) 40 MG tablet, Take 1 tablet (40 mg total) by mouth 2 (two) times daily., Disp: 30 tablet, Rfl: 3 ?  pantoprazole (PROTONIX) 40 MG tablet, Take 1 tablet (40 mg total) by mouth 2 (two) times daily., Disp: 180 tablet, Rfl: 3 ?  pantoprazole (PROTONIX) 40 MG tablet, Take 1 tablet (40 mg total) by mouth daily for 14 days., Disp: 14 tablet, Rfl: 0 ?  Polyethyl Glycol-Propyl Glycol 0.4-0.3 % SOLN, INSTILL 1 DROP IN EACH EYE FOUR TIMES A DAY, Disp: , Rfl:  ?  Probiotic Product ([employer] ADV PROBIOTIC GUMMIES PO), Take 1 each by mouth daily., Disp: , Rfl:   ?  rifaximin (XIFAXAN) 550 MG TABS tablet, Take 550 mg by mouth 2 (two) times daily. (Patient not taking: Reported on 11/16/2021), Disp: , Rfl:  ?  rifaximin (XIFAXAN) 550 MG TABS tablet, Take by mouth., Disp: , Rfl:  ?  Semaglutide,0.25 or 0.5MG/DOS, 2 MG/1.5ML SOPN, Inject 0.5 mg into the skin once a week., Disp: , Rfl:  ?  sucralfate (CARAFATE) 1 g tablet, Take 1 tablet (1 g total) by mouth 4 (four) times daily., Disp: 60 tablet, Rfl: 0 ?  terazosin (HYTRIN) 5 MG capsule, Take 5 mg by mouth at bedtime. (Patient not taking: Reported on 11/16/2021), Disp: , Rfl:  ?  terazosin (HYTRIN) 5 MG capsule, Take 1 capsule by mouth at bedtime., Disp: , Rfl:  ?  vitamin C (ASCORBIC  ACID) 500 MG tablet, Take 500 mg by mouth daily. (Patient not taking: Reported on 11/16/2021), Disp: , Rfl:  ?  vitamin E 180 MG (400 UNITS) capsule, Take 1 capsule by mouth at bedtime., Disp: , Rfl:  ? ?Past Medical History: ?Past Medical History:  ?Diagnosis Date  ? Arthritis   ? Bleeding ulcer   ? Cancer [facility])   ? melanoma, carcinoma  ? Cirrhosis of liver ([place])   ? Diabetes mellitus without complication ([place])   ? Heart murmur   ? Hypertension   ? Iron deficiency anemia   ? NASH (nonalcoholic steatohepatitis)   ? Sleep apnea   ? Spleen enlarged   ? Thrombocytopenia ([place])   ? ? ?Tobacco Use: ?Social History  ? ?Tobacco Use  ?Smoking Status Former  ? Packs/day: 0.50  ? Years: 5.00  ? Pack years: 2.50  ? Types: Cigarettes  ? Quit date: 10/16/1968  ? Years since quitting: 53.4  ?Smokeless Tobacco Never  ? ? ?Labs: ?Review Flowsheet   ? ?  ?  Latest Ref Rng & Units 08/07/2018 07/19/2019 04/24/2021  ?Labs for ITP Cardiac and Pulmonary Rehab  ?Hemoglobin A1c 4.8 - 5.6 %  6.1   4.8    ?Bicarbonate 20.0 - 28.0 mmol/L 26.4      ?O2 Saturation % 70.7      ?  ? ? Multiple values from one day are sorted in reverse-chronological order  ?  ?  ? ? ? ?Exercise Target Goals: ?Exercise Program Goal: ?Individual exercise prescription set using results from initial 6 min  walk test and THRR while considering  patient?s activity barriers and safety.  ? ?Exercise Prescription Goal: ?Initial exercise prescription builds to 30-45 minutes a day of aerobic activity, 2-3 days per we

## 2022-03-14 ENCOUNTER — Encounter: Payer: No Typology Code available for payment source | Admitting: *Deleted

## 2022-03-14 DIAGNOSIS — Z952 Presence of prosthetic heart valve: Secondary | ICD-10-CM | POA: Diagnosis not present

## 2022-03-14 NOTE — Progress Notes (Signed)
Daily Session Note ? ?Patient Details  ?Name: Joshua Berry ?MRN: [id number] ?Date of Birth: 05-07-1946 ?Referring Provider:   ?Flowsheet Row Cardiac Rehab from 12/04/2021 in [facility] Cardiac and Pulmonary Rehab  ?Referring Provider Lorelei Pont MD ([place])  ? ?  ? ? ?Encounter Date: 03/14/2022 ? ?Check In: ? Session Check In - 03/14/22 1330   ? ?  ? Check-In  ? Supervising physician immediately available to respond to emergencies See telemetry face sheet for immediately available ER MD   ? Location [facility]-Cardiac & Pulmonary Rehab   ? Staff Present Renita Papa, RN BSN;Joseph [name], RCP,RRT,BSRT;Jessica [place], [place], [place], [place], CCET   ? Virtual Visit No   ? Medication changes reported     No   ? Fall or balance concerns reported    No   ? Warm-up and Cool-down Performed on first and last piece of equipment   ? Resistance Training Performed Yes   ? VAD Patient? No   ? PAD/SET Patient? No   ?  ? Pain Assessment  ? Currently in Pain? No/denies   ? ?  ?  ? ?  ? ? ? ? ? ?Social History  ? ?Tobacco Use  ?Smoking Status Former  ? Packs/day: 0.50  ? Years: 5.00  ? Pack years: 2.50  ? Types: Cigarettes  ? Quit date: 10/16/1968  ? Years since quitting: 53.4  ?Smokeless Tobacco Never  ? ? ?Goals Met:  ?Independence with exercise equipment ?Exercise tolerated well ?No report of concerns or symptoms today ?Strength training completed today ? ?Goals Unmet:  ?Not Applicable ? ?Comments: Pt able to follow exercise prescription today without complaint.  Will continue to monitor for progression. ? ? ? ?Dr. Emily Filbert is Medical Director for [place].  ?Dr. Ottie Glazier is Medical Director for [facility] Pulmonary Rehabilitation. ?

## 2022-03-21 ENCOUNTER — Encounter: Payer: No Typology Code available for payment source | Attending: Internal Medicine

## 2022-03-21 DIAGNOSIS — Z952 Presence of prosthetic heart valve: Secondary | ICD-10-CM | POA: Insufficient documentation

## 2022-03-25 ENCOUNTER — Ambulatory Visit: Payer: No Typology Code available for payment source

## 2022-03-27 ENCOUNTER — Ambulatory Visit: Payer: No Typology Code available for payment source

## 2022-03-28 ENCOUNTER — Ambulatory Visit: Payer: No Typology Code available for payment source

## 2022-03-28 ENCOUNTER — Telehealth: Payer: Self-pay

## 2022-03-28 DIAGNOSIS — Z952 Presence of prosthetic heart valve: Secondary | ICD-10-CM

## 2022-03-28 NOTE — Telephone Encounter (Signed)
Spoke with patient regarding cardiac rehab. Has not attended since 3/30, but attendance has been sporadic throughout the program. 18 weeks is this week. Patient states he is unable to drive at this time and would lack transportation. He has been out with getting infusions and injections, and is soon to have surgery on his teeth. At this time, he would like to be discharged from the program. Patient asked about returning back when he feels ready and I advised him to contact the [place] since they're the ones covering the sessions. Patient in agreement. ?

## 2022-03-28 NOTE — Progress Notes (Signed)
Cardiac Individual Treatment Plan ? ?Patient Details  ?Name: Joshua Berry ?MRN: [id number] ?Date of Birth: 12-Jul-1946 ?Referring Provider:   ?Flowsheet Row Cardiac Rehab from 12/04/2021 in [facility] Cardiac and Pulmonary Rehab  ?Referring Provider Lorelei Pont MD ([place])  ? ?  ? ? ?Initial Encounter Date:  ?Flowsheet Row Cardiac Rehab from 12/04/2021 in [facility] Cardiac and Pulmonary Rehab  ?Date 12/04/21  ? ?  ? ? ?Visit Diagnosis: S/P TAVR (transcatheter aortic valve replacement) ? ?Patient's Home Medications on Admission: ? ?Current Outpatient Medications:  ?  albuterol (VENTOLIN HFA) 108 (90 Base) MCG/ACT inhaler, Inhale 1-2 puffs into the lungs every 6 (six) hours as needed for wheezing or shortness of breath. (Patient not taking: Reported on 11/16/2021), Disp: , Rfl:  ?  albuterol (VENTOLIN HFA) 108 (90 Base) MCG/ACT inhaler, Inhale into the lungs., Disp: , Rfl:  ?  amoxicillin (AMOXIL) 500 MG capsule, TAKE FOUR CAPSULES BY MOUTH AS DIRECTED BY YOUR PROVIDER 30 TO 60 MINUTES PRIOR TO PROCEDURES., Disp: , Rfl:  ?  ascorbic acid (VITAMIN C) 500 MG tablet, Take 2 tablets by mouth daily., Disp: , Rfl:  ?  atorvastatin (LIPITOR) 10 MG tablet, Take 20 mg by mouth daily., Disp: , Rfl:  ?  augmented betamethasone dipropionate (DIPROLENE-AF) 0.05 % cream, Apply 1 application topically 2 (two) times daily., Disp: , Rfl:  ?  carboxymethylcellulose (REFRESH PLUS) 0.5 % SOLN, 1 drop at bedtime., Disp: , Rfl:  ?  Carboxymethylcellulose Sodium 1 % GEL, APPLY 1 DROP TO EACH EYE AT BEDTIME (Patient not taking: Reported on 11/16/2021), Disp: , Rfl:  ?  cholecalciferol (VITAMIN D) 25 MCG (1000 UT) tablet, Take 1,000 Units by mouth 2 (two) times daily.  (Patient not taking: Reported on 11/16/2021), Disp: , Rfl:  ?  Cholecalciferol 50 MCG (2000 UT) TABS, Take 1 tablet by mouth daily., Disp: , Rfl:  ?  famotidine (PEPCID) 40 MG tablet, Take by mouth., Disp: , Rfl:  ?  ferrous sulfate 325 (65 FE) MG tablet, Take 325 mg by mouth daily with  breakfast. (Patient not taking: Reported on 11/16/2021), Disp: , Rfl:  ?  fluorouracil (EFUDEX) 5 % cream, APPLY AS DIRECTED TO AFFECTED AREA AS DIRECTED START OCT OR JAN. APPLY 4-6 WEEKS AS TOLERATED; [place], PAT DRY, DO NOT RUB; TREAT: SCALP/FACE/EARS/NECK/ARMS; DO NOT APPLY NEAR EYES, NOSTRILS, MOUTH; [place] HANDS AFTERWARDS; APPLY MOISTURIZER EVERY 2 HOURS WHEN AWAKE; AVOID SUN. SEE HANDOUT, Disp: , Rfl:  ?  fluticasone (FLONASE) 50 MCG/ACT nasal spray, Place 1 spray into both nostrils daily., Disp: , Rfl:  ?  folic acid (FOLVITE) 1 MG tablet, Take 1 mg by mouth at bedtime. (Patient not taking: Reported on 11/16/2021), Disp: , Rfl:  ?  folic acid (FOLVITE) 1 MG tablet, Take 1 tablet by mouth daily., Disp: , Rfl:  ?  furosemide (LASIX) 20 MG tablet, Take by mouth., Disp: , Rfl:  ?  furosemide (LASIX) 40 MG tablet, Take 1 tablet (40 mg total) by mouth daily., Disp: 30 tablet, Rfl: 0 ?  Glycerin-Hypromellose-PEG 400 0.2-0.2-1 % SOLN, Apply 1 drop to eye 4 (four) times daily., Disp: , Rfl:  ?  insulin aspart (NOVOLOG) 100 UNIT/ML FlexPen, Inject into the skin., Disp: , Rfl:  ?  insulin aspart (NOVOLOG) 100 UNIT/ML injection, Inject 10 Units into the skin 3 (three) times daily before meals. (Patient not taking: Reported on 11/16/2021), Disp: 10 mL, Rfl: 11 ?  insulin glargine (LANTUS) 100 UNIT/ML injection, Inject 0.16 mLs (16 Units total) into the  skin daily. (Patient not taking: Reported on 11/16/2021), Disp: 10 mL, Rfl: 11 ?  insulin glargine (LANTUS) 100 UNIT/ML Solostar Pen, Inject into the skin., Disp: , Rfl:  ?  insulin glargine-yfgn (SEMGLEE) 100 UNIT/ML Pen, INJECT 14 UNITS SUBCUTANEOUSLY DAILY (USE WITHIN 28 DAYS AFTER OPENING PEN) (Patient not taking: Reported on 11/16/2021), Disp: , Rfl:  ?  ipratropium-albuterol (DUONEB) 0.5-2.5 (3) MG/3ML SOLN, Take 3 mLs by nebulization every 4 (four) hours as needed (shortness of breath).  (Patient not taking: Reported on 11/16/2021), Disp: , Rfl:  ?  ketotifen (ZADITOR) 0.025  % ophthalmic solution, Place 1 drop into both eyes 2 (two) times daily. (Patient not taking: Reported on 11/16/2021), Disp: , Rfl:  ?  ketotifen (ZADITOR) 0.025 % ophthalmic solution, Place 1 drop into both eyes as needed (for allergies)., Disp: , Rfl:  ?  Lactobacillus Rhamnosus, GG, (RA PROBIOTIC DIGESTIVE CARE) CAPS, Take 1 capsule by mouth daily., Disp: , Rfl:  ?  lactulose (CHRONULAC) 10 GM/15ML solution, Take 10 g by mouth 3 (three) times daily. (Patient not taking: Reported on 11/16/2021), Disp: , Rfl:  ?  lactulose (CHRONULAC) 10 GM/15ML solution, Take by mouth., Disp: , Rfl:  ?  levothyroxine (SYNTHROID) 100 MCG tablet, TAKE ONE TABLET BY MOUTH DAILY ON AN EMPTY STOMACH, Disp: , Rfl:  ?  levothyroxine (SYNTHROID, LEVOTHROID) 100 MCG tablet, Take 100 mcg by mouth daily before breakfast. (Patient not taking: Reported on 11/16/2021), Disp: , Rfl:  ?  Lidocaine 4 % PTCH, Place onto the skin., Disp: , Rfl:  ?  MAGNESIUM GLUCONATE PO, Take 420 mg by mouth daily. (Patient not taking: Reported on 11/16/2021), Disp: , Rfl:  ?  Magnesium Oxide 420 MG TABS, Take 2 tablets by mouth 2 (two) times daily., Disp: , Rfl:  ?  octreotide (SANDOSTATIN LAR) 20 MG injection, INJECT 1 VIAL (20MG) INTRAMUSCULARLY EVERY 28 DAYS (FOR INFUSION CLINIC), Disp: , Rfl:  ?  Omega-3 Fatty Acids (FISH OIL) 1000 MG CAPS, Take 1 capsule by mouth 2 (two) times daily., Disp: , Rfl:  ?  pantoprazole (PROTONIX) 40 MG tablet, Take 1 tablet (40 mg total) by mouth 2 (two) times daily., Disp: 30 tablet, Rfl: 3 ?  pantoprazole (PROTONIX) 40 MG tablet, Take 1 tablet (40 mg total) by mouth 2 (two) times daily., Disp: 180 tablet, Rfl: 3 ?  pantoprazole (PROTONIX) 40 MG tablet, Take 1 tablet (40 mg total) by mouth daily for 14 days., Disp: 14 tablet, Rfl: 0 ?  Polyethyl Glycol-Propyl Glycol 0.4-0.3 % SOLN, INSTILL 1 DROP IN EACH EYE FOUR TIMES A DAY, Disp: , Rfl:  ?  Probiotic Product ([employer] ADV PROBIOTIC GUMMIES PO), Take 1 each by mouth daily., Disp: , Rfl:   ?  rifaximin (XIFAXAN) 550 MG TABS tablet, Take 550 mg by mouth 2 (two) times daily. (Patient not taking: Reported on 11/16/2021), Disp: , Rfl:  ?  rifaximin (XIFAXAN) 550 MG TABS tablet, Take by mouth., Disp: , Rfl:  ?  Semaglutide,0.25 or 0.5MG/DOS, 2 MG/1.5ML SOPN, Inject 0.5 mg into the skin once a week., Disp: , Rfl:  ?  sucralfate (CARAFATE) 1 g tablet, Take 1 tablet (1 g total) by mouth 4 (four) times daily., Disp: 60 tablet, Rfl: 0 ?  terazosin (HYTRIN) 5 MG capsule, Take 5 mg by mouth at bedtime. (Patient not taking: Reported on 11/16/2021), Disp: , Rfl:  ?  terazosin (HYTRIN) 5 MG capsule, Take 1 capsule by mouth at bedtime., Disp: , Rfl:  ?  vitamin C (ASCORBIC  ACID) 500 MG tablet, Take 500 mg by mouth daily. (Patient not taking: Reported on 11/16/2021), Disp: , Rfl:  ?  vitamin E 180 MG (400 UNITS) capsule, Take 1 capsule by mouth at bedtime., Disp: , Rfl:  ? ?Past Medical History: ?Past Medical History:  ?Diagnosis Date  ? Arthritis   ? Bleeding ulcer   ? Cancer [facility])   ? melanoma, carcinoma  ? Cirrhosis of liver ([place])   ? Diabetes mellitus without complication ([place])   ? Heart murmur   ? Hypertension   ? Iron deficiency anemia   ? NASH (nonalcoholic steatohepatitis)   ? Sleep apnea   ? Spleen enlarged   ? Thrombocytopenia ([place])   ? ? ?Tobacco Use: ?Social History  ? ?Tobacco Use  ?Smoking Status Former  ? Packs/day: 0.50  ? Years: 5.00  ? Pack years: 2.50  ? Types: Cigarettes  ? Quit date: 10/16/1968  ? Years since quitting: 53.4  ?Smokeless Tobacco Never  ? ? ?Labs: ?Review Flowsheet   ? ?  ?  Latest Ref Rng & Units 08/07/2018 07/19/2019 04/24/2021  ?Labs for ITP Cardiac and Pulmonary Rehab  ?Hemoglobin A1c 4.8 - 5.6 %  6.1   4.8    ?Bicarbonate 20.0 - 28.0 mmol/L 26.4      ?O2 Saturation % 70.7      ?  ? ? Multiple values from one day are sorted in reverse-chronological order  ?  ?  ? ? ? ?Exercise Target Goals: ?Exercise Program Goal: ?Individual exercise prescription set using results from initial 6 min  walk test and THRR while considering  patient?s activity barriers and safety.  ? ?Exercise Prescription Goal: ?Initial exercise prescription builds to 30-45 minutes a day of aerobic activity, 2-3 days per we

## 2022-03-28 NOTE — Progress Notes (Signed)
Discharge Progress Report ? ?Patient Details  ?Name: Joshua Berry ?MRN: [id number] ?Date of Birth: 1946-12-09 ?Referring Provider:   ?Flowsheet Row Cardiac Rehab from 12/04/2021 in [facility] Cardiac and Pulmonary Rehab  ?Referring Provider Lorelei Pont MD ([place])  ? ?  ? ? ? ?Number of Visits: 17 ? ?Reason for Discharge:  ?Early Exit:  Personal, Medical, Lack of Attendance- lack of transportation; sporadic attendance, has been receiving injections and infusions. Will have surgery soon on his teeth. ? ?Smoking History:  ?Social History  ? ?Tobacco Use  ?Smoking Status Former  ? Packs/day: 0.50  ? Years: 5.00  ? Pack years: 2.50  ? Types: Cigarettes  ? Quit date: 10/16/1968  ? Years since quitting: 53.4  ?Smokeless Tobacco Never  ? ? ?Diagnosis:  ?S/P TAVR (transcatheter aortic valve replacement) ? ?ADL UCSD: ? ? ?Initial Exercise Prescription: ? Initial Exercise Prescription - 12/04/21 1000   ? ?  ? Date of Initial Exercise RX and Referring Provider  ? Date 12/04/21   ? Referring Provider Lorelei Pont MD ([place])   ?  ? Oxygen  ? Maintain Oxygen Saturation 88% or higher   ?  ? Recumbant Bike  ? Level 1   ? RPM 60   ? Watts 15   ? Minutes 15   ? METs 1   ?  ? NuStep  ? Level 1   ? SPM 80   ? Minutes 15   ? METs 1   ?  ? REL-XR  ? Level 1   ? Speed 50   ? Minutes 15   ? METs 1   ?  ? Track  ? Laps 10   ? Minutes 15   ? METs 1.54   ?  ? Prescription Details  ? Frequency (times per week) 3   ? Duration Progress to 30 minutes of continuous aerobic without signs/symptoms of physical distress   ?  ? Intensity  ? THRR 40-80% of Max Heartrate 94-127   ? Ratings of Perceived Exertion 11-13   ? Perceived Dyspnea 0-4   ?  ? Progression  ? Progression Continue to progress workloads to maintain intensity without signs/symptoms of physical distress.   ?  ? Resistance Training  ? Training Prescription Yes   ? Weight 3 lb   ? Reps 10-15   ? ?  ?  ? ?  ? ? ?Discharge Exercise Prescription (Final Exercise Prescription Changes): ? Exercise  Prescription Changes - 03/26/22 1500   ? ?  ? Response to Exercise  ? Blood Pressure (Admit) 130/64   ? Blood Pressure (Exit) 118/60   ? Heart Rate (Admit) 78 bpm   ? Heart Rate (Exercise) 93 bpm   ? Heart Rate (Exit) 77 bpm   ? Rating of Perceived Exertion (Exercise) 12   ? Symptoms none   ? Duration Continue with 30 min of aerobic exercise without signs/symptoms of physical distress.   ? Intensity THRR unchanged   ?  ? Progression  ? Progression Continue to progress workloads to maintain intensity without signs/symptoms of physical distress.   ? Average METs 1.93   ?  ? Resistance Training  ? Training Prescription Yes   ? Weight 5 lb   ? Reps 10-15   ?  ? Interval Training  ? Interval Training No   ?  ? REL-XR  ? Level 3   ? Minutes 15   ? METs 2   ?  ? Track  ? Laps 10   ?  Minutes 15   ? METs 1.54   ?  ? Home Exercise Plan  ? Plans to continue exercise at Home (comment)   walk & PT exercises  ? Frequency Add 2 additional days to program exercise sessions.   ? Initial Home Exercises Provided 03/06/22   ?  ? Oxygen  ? Maintain Oxygen Saturation 88% or higher   ? ?  ?  ? ?  ? ? ?Functional Capacity: ? 6 Minute Walk   ? ? [place] Name 12/04/21 1035  ?  ?  ?  ? 6 Minute Walk  ? Phase Initial    ? Distance 605 feet    ? Walk Time 6 minutes    ? # of Rest Breaks 0    ? MPH 1.14    ? METS 0.94    ? RPE 11    ? Perceived Dyspnea  2    ? VO2 Peak 3.31    ? Symptoms Yes (comment)    ? Comments L knee pain 4/10, SOB, fatigue    ? Resting HR 62 bpm    ? Resting BP 104/58    ? Resting Oxygen Saturation  99 %    ? Exercise Oxygen Saturation  during 6 min walk 98 %    ? Max Ex. HR 90 bpm    ? Max Ex. BP 118/60    ? 2 Minute Post BP 102/56    ? ?  ?  ? ?  ? ? ? ?Nutrition & Weight - Outcomes: ? Pre Biometrics - 12/04/21 1029   ? ?  ? Pre Biometrics  ? Height 5' 11.75" (1.822 m)   ? Weight 234 lb 12.8 oz (106.5 kg)   ? BMI (Calculated) 32.08   ? Single Leg Stand 0 seconds   ? ?  ?  ? ?  ? ? ?Nutrition: ? Nutrition Therapy & Goals -  01/17/22 1306   ? ?  ? Nutrition Therapy  ? Diet Heart healthy, low Na, T2DM MNT   ? Drug/Food Interactions Statins/Certain Fruits   ? Protein (specify units) 80g   ? Fiber 30 grams   ? Whole Grain Foods 3 servings   ? Saturated Fats 12 max. grams   ? Fruits and Vegetables 8 servings/day   ? Sodium 2 grams   ?  ? Personal Nutrition Goals  ? Nutrition Goal ST: increase vegetable variety, monitor CHO amount per meal and snack (1-2 CHO snacks and 2-4 CHO meals), and swapping out breakfast with making an omelette in a mug.  LT: maintain A1C ~7, limit Na <2g   ? Comments 76 y.o. M admitted to Cardiac Rehab s/p TAVR. Prsents with T2DM, hepatic encephalopathy, cirrhosis, HLD, hypothyroid.  Relavent medications include lipitor, vitamin C, vitamin D, folvite, lasix, novolog, lantus, lactulose, fish oil, probiotic PYP Score: 70. Vegetables & Fruits 11/12. Breads, Grains & Cereals 8/12. Red & Processed Meat 8/12. Poultry 0/2. Fish & Shellfish 2/4. Beans, Nuts & Seeds 0/4. Milk & Dairy Foods 5/6. Toppings, Oils, Seasonings & Salt 16/20. Sweets, Snacks & Restaurant Food 10/14. Beverages 10/10. Daughter Lattie Haw reports that he has made changes before, but has fallen off of that for the most part as his major barriers are shopping and cooking. His daughter is a caretaker for his wife and himself. He reports being educated in the past about nutrition and specifically diabetes. He reports eating mostly vegetable and fish and poultry. B: just add and egg or sausage bowl L: hamburger patty  with apples and "sauce", roll. Lasagna. Fish or chicken. Cafeteria meals that they get delivered to him S: saurkraut D: meat and vegetable (small salads, coleslaw, spinach, tomatoes) and starch. Does not use fat with cooking, does not add salt - he uses spices that do not have sodium. Drinks: water, tea - unsweet, coffee (creamer with low calories and low sugar), diet coke every once and a while, he will use mio to water sometimes. He likes fruit, but  tries to avoid it; discussed T2DM MNT including CHO amount, quality, and pairing with protein, fiber, and fat. [name] was open to changes in variety, CHO monitoring, and swapping out breakfast with making an omelette in a mug.   ?  ? Intervention Plan  ? Intervention Prescribe, educate and counsel regarding individualized specific dietary modifications aiming towards targeted core components such as weight, hypertension, lipid management, diabetes, heart failure and other comorbidities.   ? Expected Outcomes Short Term Goal: Understand basic principles of dietary content, such as calories, fat, sodium, cholesterol and nutrients.;Short Term Goal: A plan has been developed with personal nutrition goals set during dietitian appointment.;Long Term Goal: Adherence to prescribed nutrition plan.   ? ?  ?  ? ?  ? ? ? ?Goals reviewed with patient; copy given to patient. ?

## 2022-04-01 ENCOUNTER — Ambulatory Visit: Payer: No Typology Code available for payment source

## 2022-04-03 ENCOUNTER — Ambulatory Visit: Payer: No Typology Code available for payment source

## 2022-04-04 ENCOUNTER — Ambulatory Visit: Payer: No Typology Code available for payment source

## 2022-04-06 ENCOUNTER — Emergency Department: Payer: Medicare Other

## 2022-04-06 ENCOUNTER — Other Ambulatory Visit: Payer: Self-pay

## 2022-04-06 ENCOUNTER — Observation Stay
Admission: EM | Admit: 2022-04-06 | Discharge: 2022-04-07 | Disposition: A | Discharge status: Home / Self Care | Payer: Medicare Other | Attending: Internal Medicine | Admitting: Internal Medicine

## 2022-04-06 DIAGNOSIS — Z91040 Latex allergy status: Secondary | ICD-10-CM | POA: Insufficient documentation

## 2022-04-06 DIAGNOSIS — I110 Hypertensive heart disease with heart failure: Secondary | ICD-10-CM | POA: Diagnosis not present

## 2022-04-06 DIAGNOSIS — R072 Precordial pain: Secondary | ICD-10-CM | POA: Diagnosis not present

## 2022-04-06 DIAGNOSIS — K7460 Unspecified cirrhosis of liver: Secondary | ICD-10-CM | POA: Diagnosis present

## 2022-04-06 DIAGNOSIS — Z85820 Personal history of malignant melanoma of skin: Secondary | ICD-10-CM | POA: Insufficient documentation

## 2022-04-06 DIAGNOSIS — I5032 Chronic diastolic (congestive) heart failure: Secondary | ICD-10-CM | POA: Diagnosis present

## 2022-04-06 DIAGNOSIS — Z952 Presence of prosthetic heart valve: Secondary | ICD-10-CM | POA: Insufficient documentation

## 2022-04-06 DIAGNOSIS — Z79899 Other long term (current) drug therapy: Secondary | ICD-10-CM | POA: Insufficient documentation

## 2022-04-06 DIAGNOSIS — D61818 Other pancytopenia: Secondary | ICD-10-CM | POA: Diagnosis not present

## 2022-04-06 DIAGNOSIS — Z794 Long term (current) use of insulin: Secondary | ICD-10-CM | POA: Diagnosis not present

## 2022-04-06 DIAGNOSIS — E119 Type 2 diabetes mellitus without complications: Secondary | ICD-10-CM | POA: Insufficient documentation

## 2022-04-06 DIAGNOSIS — R079 Chest pain, unspecified: Secondary | ICD-10-CM | POA: Diagnosis present

## 2022-04-06 DIAGNOSIS — I10 Essential (primary) hypertension: Secondary | ICD-10-CM | POA: Diagnosis present

## 2022-04-06 DIAGNOSIS — R188 Other ascites: Secondary | ICD-10-CM

## 2022-04-06 DIAGNOSIS — Z87891 Personal history of nicotine dependence: Secondary | ICD-10-CM | POA: Insufficient documentation

## 2022-04-06 DIAGNOSIS — E1165 Type 2 diabetes mellitus with hyperglycemia: Secondary | ICD-10-CM

## 2022-04-06 DIAGNOSIS — E785 Hyperlipidemia, unspecified: Secondary | ICD-10-CM | POA: Diagnosis present

## 2022-04-06 DIAGNOSIS — R0789 Other chest pain: Secondary | ICD-10-CM | POA: Diagnosis present

## 2022-04-06 LAB — BASIC METABOLIC PANEL
Anion gap: 8 (ref 5–15)
BUN: 15 mg/dL (ref 8–23)
CO2: 23 mmol/L (ref 22–32)
Calcium: 9 mg/dL (ref 8.9–10.3)
Chloride: 105 mmol/L (ref 98–111)
Creatinine, Ser: 1.15 mg/dL (ref 0.61–1.24)
GFR, Estimated: >60 mL/min (ref >60)
Glucose, Bld: 281 mg/dL — ABNORMAL HIGH (ref 70–99)
Potassium: 3.7 mmol/L (ref 3.5–5.1)
Sodium: 136 mmol/L (ref 135–145)

## 2022-04-06 LAB — CBC
HCT: 32.1 % — ABNORMAL LOW (ref 39.0–52.0)
Hemoglobin: 10.8 g/dL — ABNORMAL LOW (ref 13.0–17.0)
MCH: 33 pg (ref 26.0–34.0)
MCHC: 33.6 g/dL (ref 30.0–36.0)
MCV: 98.2 fL (ref 80.0–100.0)
Platelets: 44 10*3/uL — ABNORMAL LOW (ref 150–400)
RBC: 3.27 MIL/uL — ABNORMAL LOW (ref 4.22–5.81)
RDW: 16.7 % — ABNORMAL HIGH (ref 11.5–15.5)
WBC: 1.9 10*3/uL — ABNORMAL LOW (ref 4.0–10.5)
nRBC: 0 % (ref 0.0–0.2)

## 2022-04-06 LAB — TROPONIN I (HIGH SENSITIVITY)
Troponin I (High Sensitivity): 7 ng/L (ref <18)
Troponin I (High Sensitivity): 7 ng/L (ref <18)

## 2022-04-06 LAB — LIPID PANEL
Cholesterol: 145 mg/dL (ref 0–200)
HDL: 43 mg/dL (ref >40)
LDL Cholesterol: 78 mg/dL (ref 0–99)
Total CHOL/HDL Ratio: 3.4 RATIO
Triglycerides: 122 mg/dL (ref <150)
VLDL: 24 mg/dL (ref 0–40)

## 2022-04-06 LAB — BRAIN NATRIURETIC PEPTIDE: B Natriuretic Peptide: 8.3 pg/mL (ref 0.0–100.0)

## 2022-04-06 LAB — AMMONIA: Ammonia: 62 umol/L — ABNORMAL HIGH (ref 9–35)

## 2022-04-06 LAB — CBG MONITORING, ED
Glucose-Capillary: 132 mg/dL — ABNORMAL HIGH (ref 70–99)
Glucose-Capillary: 144 mg/dL — ABNORMAL HIGH (ref 70–99)

## 2022-04-06 MED ORDER — MORPHINE SULFATE (PF) 2 MG/ML IV SOLN: 0.5000 mg | INTRAVENOUS | Status: DC | PRN

## 2022-04-06 MED ORDER — INSULIN ASPART 100 UNIT/ML IJ SOLN: 0.0000 [IU] | Freq: Every day | INTRAMUSCULAR | Status: DC

## 2022-04-06 MED ORDER — FERROUS SULFATE 325 (65 FE) MG PO TABS
325.0000 mg | ORAL_TABLET | Freq: Every day | ORAL | Status: DC
Start: 2022-04-07 — End: 2022-04-07
  Administered 2022-04-07: 325 mg via ORAL
  Filled 2022-04-06: qty 1

## 2022-04-06 MED ORDER — ACETAMINOPHEN 325 MG PO TABS
650.0000 mg | ORAL_TABLET | Freq: Four times a day (QID) | ORAL | Status: DC | PRN
Start: 2022-04-06 — End: 2022-04-07

## 2022-04-06 MED ORDER — HYDRALAZINE HCL 20 MG/ML IJ SOLN: 5.0000 mg | INTRAMUSCULAR | Status: DC | PRN

## 2022-04-06 MED ORDER — ALBUTEROL SULFATE (2.5 MG/3ML) 0.083% IN NEBU
3.0000 mL | INHALATION_SOLUTION | RESPIRATORY_TRACT | Status: DC | PRN
Start: 2022-04-06 — End: 2022-04-07

## 2022-04-06 MED ORDER — TERAZOSIN HCL 5 MG PO CAPS
5.0000 mg | ORAL_CAPSULE | Freq: Every day | ORAL | Status: DC
  Administered 2022-04-06: 5 mg via ORAL
  Filled 2022-04-06: qty 1

## 2022-04-06 MED ORDER — INSULIN GLARGINE-YFGN 100 UNIT/ML ~~LOC~~ SOLN
10.0000 [IU] | Freq: Every day | SUBCUTANEOUS | Status: DC
  Administered 2022-04-07: 10 [IU] via SUBCUTANEOUS
  Filled 2022-04-06: qty 0.1

## 2022-04-06 MED ORDER — LACTULOSE 10 GM/15ML PO SOLN
10.0000 g | Freq: Every day | ORAL | Status: DC
  Administered 2022-04-06 – 2022-04-07 (×2): 10 g via ORAL
  Filled 2022-04-06 (×2): qty 30

## 2022-04-06 MED ORDER — FUROSEMIDE 40 MG PO TABS
20.0000 mg | ORAL_TABLET | Freq: Every day | ORAL | Status: DC
  Administered 2022-04-07: 20 mg via ORAL
  Filled 2022-04-06 (×2): qty 1

## 2022-04-06 MED ORDER — PANTOPRAZOLE SODIUM 40 MG PO TBEC
40.0000 mg | DELAYED_RELEASE_TABLET | Freq: Two times a day (BID) | ORAL | Status: DC
  Administered 2022-04-07: 40 mg via ORAL
  Filled 2022-04-06 (×2): qty 1

## 2022-04-06 MED ORDER — IOHEXOL 350 MG/ML SOLN
75.0000 mL | Freq: Once | INTRAVENOUS | Status: AC | PRN
  Administered 2022-04-06: 75 mL via INTRAVENOUS

## 2022-04-06 MED ORDER — INSULIN ASPART 100 UNIT/ML IJ SOLN
0.0000 [IU] | Freq: Three times a day (TID) | INTRAMUSCULAR | Status: DC
  Administered 2022-04-07: 1 [IU] via SUBCUTANEOUS
  Filled 2022-04-06: qty 1

## 2022-04-06 MED ORDER — LEVOTHYROXINE SODIUM 100 MCG PO TABS
100.0000 ug | ORAL_TABLET | Freq: Every day | ORAL | Status: DC
  Administered 2022-04-07: 100 ug via ORAL
  Filled 2022-04-06 (×2): qty 1

## 2022-04-06 MED ORDER — RIFAXIMIN 550 MG PO TABS
550.0000 mg | ORAL_TABLET | Freq: Two times a day (BID) | ORAL | Status: DC
  Administered 2022-04-06 – 2022-04-07 (×2): 550 mg via ORAL
  Filled 2022-04-06 (×2): qty 1

## 2022-04-06 MED ORDER — INSULIN GLARGINE-YFGN 100 UNIT/ML ~~LOC~~ SOPN
10.0000 [IU] | PEN_INJECTOR | Freq: Every day | SUBCUTANEOUS | Status: DC
  Filled 2022-04-06: qty 3

## 2022-04-06 MED ORDER — ASPIRIN EC 81 MG PO TBEC: 81.0000 mg | DELAYED_RELEASE_TABLET | Freq: Every day | ORAL | Status: DC

## 2022-04-06 MED ORDER — NITROGLYCERIN 0.4 MG SL SUBL: 0.4000 mg | SUBLINGUAL_TABLET | SUBLINGUAL | Status: DC | PRN

## 2022-04-06 MED ORDER — ATORVASTATIN CALCIUM 20 MG PO TABS
20.0000 mg | ORAL_TABLET | Freq: Every day | ORAL | Status: DC
  Administered 2022-04-06: 20 mg via ORAL
  Filled 2022-04-06 (×2): qty 1

## 2022-04-06 MED ORDER — FOLIC ACID 1 MG PO TABS
1.0000 mg | ORAL_TABLET | Freq: Every day | ORAL | Status: DC
  Administered 2022-04-06 – 2022-04-07 (×2): 1 mg via ORAL
  Filled 2022-04-06 (×2): qty 1

## 2022-04-06 MED ORDER — ONDANSETRON HCL 4 MG/2ML IJ SOLN
4.0000 mg | Freq: Three times a day (TID) | INTRAMUSCULAR | Status: DC | PRN
Start: 2022-04-06 — End: 2022-04-07

## 2022-04-06 MED ORDER — ASCORBIC ACID 500 MG PO TABS
1000.0000 mg | ORAL_TABLET | Freq: Every day | ORAL | Status: DC
  Administered 2022-04-06 – 2022-04-07 (×2): 1000 mg via ORAL
  Filled 2022-04-06 (×2): qty 2

## 2022-04-06 NOTE — Assessment & Plan Note (Signed)
2D echo on 12/23/2018 showed EF of 55-65%.  Patient does not have leg edema or JVD.  No pulm edema on chest x-ray.  BNP is 8.3.  CHF seems to be compensated. ?-Continue home Lasix ?

## 2022-04-06 NOTE — Assessment & Plan Note (Signed)
-   IV hydralazine as needed ?-Lasix ?

## 2022-04-06 NOTE — ED Provider Notes (Signed)
? ?[facility] ?Provider Note ? ? ? Event Date/Time  ? First MD Initiated Contact with Patient 04/06/22 1123   ?  (approximate) ? ? ?History  ? ?Chest Pain ? ? ?HPI ? ?Joshua Berry is a 76 y.o. male with a history of cirrhosis, esophageal varices, diabetes aortic valve replacement who presents with complaints of chest pain.  Patient reports central pressure-like chest discomfort over the last several days, last night he reported pain radiating to his back with the chest pain.  He reports it comes and goes and it was worse this morning than last night.  Currently reports he is feeling improved but still intermittently having pain.   ?  ? ? ?Physical Exam  ? ?Triage Vital Signs: ?ED Triage Vitals  ?Enc Vitals Group  ?   BP 04/06/22 1121 (!) 116/54  ?   Pulse Rate 04/06/22 1121 69  ?   Resp 04/06/22 1121 18  ?   Temp 04/06/22 1121 97.7 ?F (36.5 ?C)  ?   Temp src --   ?   SpO2 04/06/22 1121 98 %  ?   Weight --   ?   Height --   ?   Head Circumference --   ?   Peak Flow --   ?   Pain Score 04/06/22 1118 3  ?   Pain Loc --   ?   Pain Edu? --   ?   Excl. in [place]? --   ? ? ?Most recent vital signs: ?Vitals:  ? 04/06/22 1300 04/06/22 1345  ?BP:  (!) 102/57  ?Pulse: 61 (!) 55  ?Resp: 16 13  ?Temp:    ?SpO2: 98% 99%  ? ? ? ?General: Awake, no distress.  ?CV:  Good peripheral perfusion.  Regular rate and rhythm ?Resp:  Normal effort.  No wheezing clear to auscultation ?Abd:  No distention.  ?Other:  No lower extremity swelling or pain ? ? ?ED Results / Procedures / Treatments  ? ?Labs ?(all labs ordered are listed, but only abnormal results are displayed) ?Labs Reviewed  ?BASIC METABOLIC PANEL - Abnormal; Notable for the following components:  ?    Result Value  ? Glucose, Bld 281 (*)   ? All other components within normal limits  ?CBC - Abnormal; Notable for the following components:  ? WBC 1.9 (*)   ? RBC 3.27 (*)   ? Hemoglobin 10.8 (*)   ? HCT 32.1 (*)   ? RDW 16.7 (*)   ? Platelets 44 (*)   ? All other  components within normal limits  ?BRAIN NATRIURETIC PEPTIDE  ?TROPONIN I (HIGH SENSITIVITY)  ? ? ? ?EKG ? ?ED ECG REPORT ?I, Lavonia Drafts, the attending physician, personally viewed and interpreted this ECG. ? ?Date: 04/06/2022 ? ?Rhythm: normal sinus rhythm ?QRS Axis: normal ?Intervals: normal ?ST/T Wave abnormalities: normal ?Narrative Interpretation: no evidence of acute ischemia ? ? ? ?RADIOLOGY ?Chest x-ray viewed interpreted by me, no acute abnormality ? ?CT angiography negative for acute abnormality ? ?PROCEDURES: ? ?Critical Care performed:  ? ?Procedures ? ? ?MEDICATIONS ORDERED IN ED: ?Medications  ?iohexol (OMNIPAQUE) 350 MG/ML injection 75 mL (75 mLs Intravenous Contrast Given 04/06/22 1306)  ? ? ? ?IMPRESSION / MDM / ASSESSMENT AND PLAN / ED COURSE  ?I reviewed the triage vital signs and the nursing notes. ? ?Patient presents with chest pain with radiation to the back as detailed above.  His heart rate and blood pressure are reassuring.  Overall he  looks comfortable at this time.  HPI is concerning for ACS, dissection.  He denies pleurisy ? ?No fevers reported, no cough ? ?Chest x-ray without evidence of pneumonia or other abnormality.  Pending labs including troponin.  We will also order CT angiography to evaluate for dissection ? ?----------------------------------------- ?2:28 PM on 04/06/2022 ?----------------------------------------- ?CT scan is reassuring however the patient continues to have intermittent brief periods of chest discomfort.  Given his age, risk factors will consult the hospitalist for admission ? ? ? ? ? ?  ? ? ?FINAL CLINICAL IMPRESSION(S) / ED DIAGNOSES  ? ?Final diagnoses:  ?Chest pain, unspecified type  ? ? ? ?Rx / DC Orders  ? ?ED Discharge Orders   ? ? None  ? ?  ? ? ? ?Note:  This document was prepared using Dragon voice recognition software and may include unintentional dictation errors. ?  ?Lavonia Drafts, MD ?04/06/22 1428 ? ?

## 2022-04-06 NOTE — Assessment & Plan Note (Addendum)
Patient had WBC 2.0, hemoglobin 10.1, platelet 46 on 11/07/2021.  Today's WBC 1.9, hemoglobin 10.8, platelet 44.  Hemoglobin stable.  This is chronic issue. ?-Follow-up with CBC ?-Continue iron supplement ?

## 2022-04-06 NOTE — ED Notes (Signed)
Pt states that he does not want medication for his CP at this time.  States "Maybe later." ?

## 2022-04-06 NOTE — ED Notes (Signed)
RN to bedside to introduce self to pt and initiate care.  ?

## 2022-04-06 NOTE — Assessment & Plan Note (Signed)
Mental status normal. ?-Check INR, PTT, ammonia level ?-Continue rifaximin and lactulose ?

## 2022-04-06 NOTE — Assessment & Plan Note (Signed)
Recent A1c 4.8.  Well-controlled.  Patient is a taking semaglutide, NovoLog and Lantus 14 units daily ?-Lantus 10 units daily ?-Sliding scale insulin ?

## 2022-04-06 NOTE — H&P (Signed)
?History and Physical  ? ? ?ELIM ECONOMOU DUK:[id number] DOB: 03/12/1946 DOA: 04/06/2022 ? ?Referring MD/NP/PA:  ? ?PCP: Lorelei Pont, MD  ? ?Patient coming from:  The patient is coming from home.  At baseline, pt is independent for most of ADL.       ? ?Chief Complaint: chest pain ? ?HPI: Joshua Berry is a 76 y.o. male with medical history significant of hypertension, hyperlipidemia, diabetes mellitus, [place], liver cirrhosis, s/p of TIPS, GI bleeding, portal hypertension, anemia, melanoma, s/p of TAVR, dCHF, OSA, who presents with chest pain. ? ?Patient states that he has chest pain intermittently for 2 days, which is located in the substernal area and lower chest, pressure-like, 6 out of 10 in severity, radiating to the back.  Associated with shortness of breath on exertion, no cough, fever or chills.  No nausea, vomiting, diarrhea or abdominal pain.  No symptoms of UTI.  No dark stool or rectal bleeding. ? ?Cardiac cath at [place] [facility] on 08/27/21: ?Coronary Findings Diagnostic Dominance: Right  ?Left Main: The vessel was visualized by angiography, is moderate in size and is angiographically normal.  ?Left Anterior Descending: The vessel exhibits minimal luminal irregularities.  ?Left Circumflex: The vessel is small. The vessel exhibits minimal luminal irregularities.  ?Right Coronary Artery: The vessel was visualized by angiography, is large and is angiographically normal.  ? ?Intervention  ?No interventions have been documented.  ? ?Data Reviewed and ED Course: pt was found to have troponin level 7, GFR> 60, WBC 1.9, hemoglobin 10.8, platelet 44, BNP 8.3, temperature normal, soft blood pressure, 99/68, 102/57, HR 55, RR 20, oxygen saturation 98% on room air.  Chest x-ray negative.  CT angiogram is negative for dissection.  Patient is placed on telemetry bed for observation. Dr. Harrell Gave of cardiology is consulted. ? ? ?EKG: Not done in ED, will get one.  ? ? ?Review of Systems:  ? ?General: no  fevers, chills, no body weight gain, has fatigue ?HEENT: no blurry vision, hearing changes or sore throat ?Respiratory: has dyspnea, no coughing, wheezing ?CV: has chest pain, no palpitations ?GI: no nausea, vomiting, abdominal pain, diarrhea, constipation ?GU: no dysuria, burning on urination, increased urinary frequency, hematuria  ?Ext: no leg edema ?Neuro: no unilateral weakness, numbness, or tingling, no vision change or hearing loss ?Skin: no rash, no skin tear. ?MSK: No muscle spasm, no deformity, no limitation of range of movement in spin ?Heme: No easy bruising.  ?Travel history: No recent long distant travel. ? ? ?Allergy:  ?Allergies  ?Allergen Reactions  ? Lisinopril Hives  ? Sulfamethoxazole Swelling  ? Camphor Rash and Swelling  ? Latex Rash  ? Nickel Rash  ? ? ?Past Medical History:  ?Diagnosis Date  ? Arthritis   ? Bleeding ulcer   ? Cancer [facility])   ? melanoma, carcinoma  ? Cirrhosis of liver ([place])   ? Diabetes mellitus without complication ([place])   ? Heart murmur   ? Hypertension   ? Iron deficiency anemia   ? NASH (nonalcoholic steatohepatitis)   ? Sleep apnea   ? Spleen enlarged   ? Thrombocytopenia ([place])   ? ? ?Past Surgical History:  ?Procedure Laterality Date  ? CHOLECYSTECTOMY    ? COLONOSCOPY N/A 07/23/2019  ? Procedure: COLONOSCOPY;  Surgeon: Lin Landsman, MD;  Location: [facility] ENDOSCOPY;  Service: Gastroenterology;  Laterality: N/A;  ? ESOPHAGOGASTRODUODENOSCOPY N/A 07/20/2019  ? Procedure: ESOPHAGOGASTRODUODENOSCOPY (EGD);  Surgeon: Lin Landsman, MD;  Location: [facility] ENDOSCOPY;  Service: Gastroenterology;  Laterality: N/A;  ? ESOPHAGOGASTRODUODENOSCOPY Left 08/20/2019  ? Procedure: ESOPHAGOGASTRODUODENOSCOPY (EGD);  Surgeon: Virgel Manifold, MD;  Location: [facility] ENDOSCOPY;  Service: Endoscopy;  Laterality: Left;  ? ESOPHAGOGASTRODUODENOSCOPY (EGD) WITH PROPOFOL N/A 04/25/2021  ? Procedure: ESOPHAGOGASTRODUODENOSCOPY (EGD) WITH PROPOFOL;  Surgeon: Lin Landsman, MD;  Location:  [facility] ENDOSCOPY;  Service: Gastroenterology;  Laterality: N/A;  ? SKIN BIOPSY    ? TEE WITHOUT CARDIOVERSION N/A 12/23/2018  ? Procedure: TRANSESOPHAGEAL ECHOCARDIOGRAM (TEE);  Surgeon: Teodoro Spray, MD;  Location: [facility] ORS;  Service: Cardiovascular;  Laterality: N/A;  ? ? ?Social History:  reports that he quit smoking about 53 years ago. His smoking use included cigarettes. He has a 2.50 pack-year smoking history. He has never used smokeless tobacco. He reports that he does not currently use alcohol. He reports that he does not currently use drugs. ? ?Family History:  ?Family History  ?Problem Relation Age of Onset  ? Heart attack Brother   ?  ? ?Prior to Admission medications   ?Medication Sig Start Date End Date Taking? Authorizing Provider  ?albuterol (VENTOLIN HFA) 108 (90 Base) MCG/ACT inhaler Inhale 1-2 puffs into the lungs every 6 (six) hours as needed for wheezing or shortness of breath. ?Patient not taking: Reported on 11/16/2021    [provider]  ?albuterol (VENTOLIN HFA) 108 (90 Base) MCG/ACT inhaler Inhale into the lungs.    [provider]  ?amoxicillin (AMOXIL) 500 MG capsule TAKE FOUR CAPSULES BY MOUTH AS DIRECTED BY YOUR PROVIDER 30 TO 60 MINUTES PRIOR TO PROCEDURES. 09/11/21   [provider]  ?ascorbic acid (VITAMIN C) 500 MG tablet Take 2 tablets by mouth daily. 06/10/08   [provider]  ?atorvastatin (LIPITOR) 10 MG tablet Take 20 mg by mouth daily.    [provider]  ?augmented betamethasone dipropionate (DIPROLENE-AF) 0.05 % cream Apply 1 application topically 2 (two) times daily.    [provider]  ?carboxymethylcellulose (REFRESH PLUS) 0.5 % SOLN 1 drop at bedtime.    [provider]  ?Carboxymethylcellulose Sodium 1 % GEL APPLY 1 DROP TO EACH EYE AT BEDTIME ?Patient not taking: Reported on 11/16/2021 06/10/21   [provider]  ?cholecalciferol (VITAMIN D) 25 MCG (1000 UT) tablet Take 1,000 Units by mouth 2 (two) times  daily.  ?Patient not taking: Reported on 11/16/2021    [provider]  ?Cholecalciferol 50 MCG (2000 UT) TABS Take 1 tablet by mouth daily. 06/13/21   [provider]  ?famotidine (PEPCID) 40 MG tablet Take by mouth. 10/25/21   [provider]  ?ferrous sulfate 325 (65 FE) MG tablet Take 325 mg by mouth daily with breakfast. ?Patient not taking: Reported on 11/16/2021    [provider]  ?fluorouracil (EFUDEX) 5 % cream APPLY AS DIRECTED TO AFFECTED AREA AS DIRECTED START OCT OR JAN. APPLY 4-6 WEEKS AS TOLERATED; [place], PAT DRY, DO NOT RUB; TREAT: SCALP/FACE/EARS/NECK/ARMS; DO NOT APPLY NEAR EYES, NOSTRILS, MOUTH; [place] HANDS AFTERWARDS; APPLY MOISTURIZER EVERY 2 HOURS WHEN AWAKE; AVOID SUN. SEE HANDOUT 01/10/21   [provider]  ?fluticasone (FLONASE) 50 MCG/ACT nasal spray Place 1 spray into both nostrils daily. 08/24/21   [provider]  ?folic acid (FOLVITE) 1 MG tablet Take 1 mg by mouth at bedtime. ?Patient not taking: Reported on 11/16/2021    [provider]  ?folic acid (FOLVITE) 1 MG tablet Take 1 tablet by mouth daily. 12/03/19   [provider]  ?furosemide (LASIX) 20 MG tablet Take by mouth. 09/26/21  [provider]  ?furosemide (LASIX) 40 MG tablet Take 1 tablet (40 mg total) by mouth daily. 07/24/19   Max Sane, MD  ?Glycerin-Hypromellose-PEG 400 0.2-0.2-1 % SOLN Apply 1 drop to eye 4 (four) times daily.    [provider]  ?insulin aspart (NOVOLOG) 100 UNIT/ML FlexPen Inject into the skin. 07/04/21   [provider]  ?insulin aspart (NOVOLOG) 100 UNIT/ML injection Inject 10 Units into the skin 3 (three) times daily before meals. ?Patient not taking: Reported on 11/16/2021 04/26/21   Nita Sells, MD  ?insulin glargine (LANTUS) 100 UNIT/ML injection Inject 0.16 mLs (16 Units total) into the skin daily. ?Patient not taking: Reported on 11/16/2021 04/26/21   Nita Sells, MD  ?insulin glargine  (LANTUS) 100 UNIT/ML Solostar Pen Inject into the skin. 10/25/21   [provider]  ?insulin glargine-yfgn (SEMGLEE) 100 UNIT/ML Pen INJECT 14 UNITS SUBCUTANEOUSLY DAILY (USE WITHIN 28 DAYS AFTER

## 2022-04-06 NOTE — ED Triage Notes (Signed)
Pt comes with c/o left sided CP that started two days ago. Pt states it has now gotten worse. Pt states the pain radiates to his back. Pt states SOB. Pt states O2 was 93 RA at home. ?

## 2022-04-06 NOTE — Assessment & Plan Note (Signed)
Lipitor 

## 2022-04-06 NOTE — Assessment & Plan Note (Signed)
Etiology is not clear.  CT angiograms negative for dissection, no central PE.  Patient had cardiac cath on 08/27/2021 which is negative for significant blockage.  Consulted with Dr. Sharene Skeans for cardiology ? ?- place to tele bed for observation ?- Trend Trop ?- prn Nitroglycerin, Morphine ?- aspirin, lipitor  ?- Risk factor stratification: will check FLP and A1C  ? ? ?

## 2022-04-07 DIAGNOSIS — R0789 Other chest pain: Secondary | ICD-10-CM | POA: Diagnosis not present

## 2022-04-07 DIAGNOSIS — I10 Essential (primary) hypertension: Secondary | ICD-10-CM | POA: Diagnosis not present

## 2022-04-07 DIAGNOSIS — E119 Type 2 diabetes mellitus without complications: Secondary | ICD-10-CM

## 2022-04-07 DIAGNOSIS — K7460 Unspecified cirrhosis of liver: Secondary | ICD-10-CM

## 2022-04-07 DIAGNOSIS — Z952 Presence of prosthetic heart valve: Secondary | ICD-10-CM

## 2022-04-07 DIAGNOSIS — E785 Hyperlipidemia, unspecified: Secondary | ICD-10-CM

## 2022-04-07 DIAGNOSIS — K7581 Nonalcoholic steatohepatitis (NASH): Secondary | ICD-10-CM

## 2022-04-07 DIAGNOSIS — R079 Chest pain, unspecified: Secondary | ICD-10-CM | POA: Diagnosis not present

## 2022-04-07 LAB — TROPONIN I (HIGH SENSITIVITY): Troponin I (High Sensitivity): 10 ng/L (ref <18)

## 2022-04-07 LAB — HEMOGLOBIN A1C
Hgb A1c MFr Bld: 6 % — ABNORMAL HIGH (ref 4.8–5.6)
Mean Plasma Glucose: 125.5 mg/dL

## 2022-04-07 LAB — BASIC METABOLIC PANEL
Anion gap: 5 (ref 5–15)
BUN: 15 mg/dL (ref 8–23)
CO2: 25 mmol/L (ref 22–32)
Calcium: 8.9 mg/dL (ref 8.9–10.3)
Chloride: 108 mmol/L (ref 98–111)
Creatinine, Ser: 1.11 mg/dL (ref 0.61–1.24)
GFR, Estimated: >60 mL/min (ref >60)
Glucose, Bld: 118 mg/dL — ABNORMAL HIGH (ref 70–99)
Potassium: 3.8 mmol/L (ref 3.5–5.1)
Sodium: 138 mmol/L (ref 135–145)

## 2022-04-07 LAB — CBC
HCT: 30.4 % — ABNORMAL LOW (ref 39.0–52.0)
Hemoglobin: 10.2 g/dL — ABNORMAL LOW (ref 13.0–17.0)
MCH: 32.8 pg (ref 26.0–34.0)
MCHC: 33.6 g/dL (ref 30.0–36.0)
MCV: 97.7 fL (ref 80.0–100.0)
Platelets: 42 10*3/uL — ABNORMAL LOW (ref 150–400)
RBC: 3.11 MIL/uL — ABNORMAL LOW (ref 4.22–5.81)
RDW: 16.8 % — ABNORMAL HIGH (ref 11.5–15.5)
WBC: 2.1 10*3/uL — ABNORMAL LOW (ref 4.0–10.5)
nRBC: 0 % (ref 0.0–0.2)

## 2022-04-07 LAB — CBG MONITORING, ED
Glucose-Capillary: 127 mg/dL — ABNORMAL HIGH (ref 70–99)
Glucose-Capillary: 123 mg/dL — ABNORMAL HIGH (ref 70–99)

## 2022-04-07 MED ORDER — LIDOCAINE 5 % EX PTCH
1.0000 | MEDICATED_PATCH | CUTANEOUS | Status: DC
  Administered 2022-04-07: 1 via TRANSDERMAL
  Filled 2022-04-07: qty 1

## 2022-04-07 MED ORDER — FUROSEMIDE 20 MG PO TABS: 20.0000 mg | ORAL_TABLET | Freq: Every day | ORAL | 1 refills | Status: DC

## 2022-04-07 MED ORDER — MELATONIN 5 MG PO TABS: 10.0000 mg | ORAL_TABLET | Freq: Every day | ORAL | Status: DC

## 2022-04-07 MED ORDER — FLUTICASONE PROPIONATE 50 MCG/ACT NA SUSP
1.0000 | Freq: Every day | NASAL | Status: DC
Start: 2022-04-07 — End: 2022-04-07

## 2022-04-07 MED ORDER — KETOTIFEN FUMARATE 0.025 % OP SOLN: 1.0000 [drp] | Freq: Two times a day (BID) | OPHTHALMIC | Status: DC

## 2022-04-07 MED ORDER — FERROUS SULFATE 325 (65 FE) MG PO TABS: 325.0000 mg | ORAL_TABLET | Freq: Every day | ORAL | Status: DC

## 2022-04-07 MED ORDER — OMEGA-3-ACID ETHYL ESTERS 1 G PO CAPS
1.0000 g | ORAL_CAPSULE | Freq: Every day | ORAL | Status: DC
  Filled 2022-04-07: qty 1

## 2022-04-07 MED ORDER — FLUTICASONE PROPIONATE 50 MCG/ACT NA SUSP
1.0000 | Freq: Every day | NASAL | Status: DC
  Administered 2022-04-07: 1 via NASAL
  Filled 2022-04-07: qty 16

## 2022-04-07 MED ORDER — PANTOPRAZOLE SODIUM 40 MG PO TBEC
40.0000 mg | DELAYED_RELEASE_TABLET | Freq: Two times a day (BID) | ORAL | 3 refills | Status: DC
Start: 2022-04-07 — End: 2023-09-25

## 2022-04-07 MED ORDER — VITAMIN D 25 MCG (1000 UNIT) PO TABS
2000.0000 [IU] | ORAL_TABLET | Freq: Every day | ORAL | Status: DC
  Administered 2022-04-07: 2000 [IU] via ORAL
  Filled 2022-04-07: qty 2

## 2022-04-07 NOTE — ED Notes (Signed)
Pt able to use urinal with no assistance needed ?

## 2022-04-07 NOTE — Consult Note (Signed)
?Cardiology Consultation:  ? ?Patient ID: Joshua Berry ?MRN: [id number]; DOB: 1946-02-04 ? ?Admit date: 04/06/2022 ?Date of Consult: 04/07/2022 ? ?PCP:  Lorelei Pont, MD ?  ?[facility] Providers: None, follows with [facility] cardiology (Dr. Duke Salvia) and [place] cardiology ? ? ?Patient Profile:  ? ?Joshua Berry is a 76 y.o. male with a hx of aortic stenosis s/p TAVR 2022 (29 mm Sapien), chronic diastolic heart failure, orthostatic intolerance, NASH cirrhosis s/p TIPS, history of GI bleed, anemia/thrombocytopenia who is being seen 04/07/2022 for the evaluation of Dr. Blaine Hamper at the request of chest pain. ? ?History of Present Illness:  ? ?Joshua Berry presented with chest discomfort. He reports that he was sleeping overnight 4/21 to 4/22 and was awakened by a substernal pressure, "like a rock on his chest," followed by specific pain radiating to his back in two locations. The pressure eased initially, then worsened again before easing. Overall it lasted about an hour. He did not have nausea or diaphoresis with this. No specific shortness of breath, though he has some chronic dyspnea on exertion. No significant changes to his regimen prior, though he did note taking a tylenol prior to falling asleep (but denies it feeling stuck in his esophagus). ? ?Since arriving to the ER, he has had no further symptoms. We reviewed his workup to date. Of note, he has follow up with GI this week given his prior EGD for GI bleeding. ? ?Per note from 01/22/22 (Dr. Duke Salvia), he was on aspirin alone due to thrombocytopenia/anemia, history of GI bleed, and NASH cirrhosis. Aspirin was on hold while GI was performed evaluation for GI bleed with EGD. Noted to have widely patent coronaries in 08/2021. ? ?Denies shortness of breath at rest. No PND, orthopnea, LE edema or unexpected weight gain. No syncope or palpitations.  ? ? ?Past Medical History:  ?Diagnosis Date  ? Arthritis   ? Bleeding ulcer   ? Cancer [facility])   ? melanoma, carcinoma  ? Cirrhosis of liver  ([place])   ? Diabetes mellitus without complication ([place])   ? Heart murmur   ? Hypertension   ? Iron deficiency anemia   ? NASH (nonalcoholic steatohepatitis)   ? Sleep apnea   ? Spleen enlarged   ? Thrombocytopenia ([place])   ? ? ?Past Surgical History:  ?Procedure Laterality Date  ? CHOLECYSTECTOMY    ? COLONOSCOPY N/A 07/23/2019  ? Procedure: COLONOSCOPY;  Surgeon: Lin Landsman, MD;  Location: [facility] ENDOSCOPY;  Service: Gastroenterology;  Laterality: N/A;  ? ESOPHAGOGASTRODUODENOSCOPY N/A 07/20/2019  ? Procedure: ESOPHAGOGASTRODUODENOSCOPY (EGD);  Surgeon: Lin Landsman, MD;  Location[facility] ENDOSCOPY;  Service: Gastroenterology;  Laterality: N/A;  ? ESOPHAGOGASTRODUODENOSCOPY Left 08/20/2019  ? Procedure: ESOPHAGOGASTRODUODENOSCOPY (EGD);  Surgeon: Virgel Manifold, MD;  Location: [facility] ENDOSCOPY;  Service: Endoscopy;  Laterality: Left;  ? ESOPHAGOGASTRODUODENOSCOPY (EGD) WITH PROPOFOL N/A 04/25/2021  ? Procedure: ESOPHAGOGASTRODUODENOSCOPY (EGD) WITH PROPOFOL;  Surgeon: Lin Landsman, MD;  Location: [facility] ENDOSCOPY;  Service: Gastroenterology;  Laterality: N/A;  ? SKIN BIOPSY    ? TEE WITHOUT CARDIOVERSION N/A 12/23/2018  ? Procedure: TRANSESOPHAGEAL ECHOCARDIOGRAM (TEE);  Surgeon: Teodoro Spray, MD;  Location: [facility] ORS;  Service: Cardiovascular;  Laterality: N/A;  ?  ? ?Home Medications:  ?Prior to Admission medications   ?Medication Sig Start Date End Date Taking? Authorizing Provider  ?aspirin EC 81 MG tablet Take 81 mg by mouth daily.   Yes [provider]  ?atorvastatin (LIPITOR) 20 MG tablet Take 20 mg by mouth at bedtime.  Yes [provider]  ?augmented betamethasone dipropionate (DIPROLENE-AF) 0.05 % cream Apply 1 application topically 2 (two) times daily.   Yes [provider]  ?Cholecalciferol 50 MCG (2000 UT) TABS Take 1 tablet by mouth daily.   Yes [provider]  ?famotidine (PEPCID) 20 MG tablet Take 20 mg by mouth daily.   Yes [provider]   ?folic acid (FOLVITE) 1 MG tablet Take 1 tablet by mouth daily. 12/03/19  Yes [provider]  ?Glycerin-Hypromellose-PEG 400 0.2-0.2-1 % SOLN Apply 1 drop to eye 4 (four) times daily.   Yes [provider]  ?insulin aspart (NOVOLOG) 100 UNIT/ML injection Inject 10 Units into the skin 3 (three) times daily before meals. ?Patient taking differently: Inject 16-22 Units into the skin 3 (three) times daily before meals. (Based on blood glucose reading) 04/26/21  Yes Nita Sells, MD  ?insulin glargine (LANTUS) 100 UNIT/ML injection Inject 0.16 mLs (16 Units total) into the skin daily. ?Patient taking differently: Inject 14 Units into the skin daily. 04/26/21  Yes Nita Sells, MD  ?ketotifen (ZADITOR) 0.025 % ophthalmic solution Place 1 drop into both eyes 2 (two) times daily.   Yes [provider]  ?Lactobacillus Rhamnosus, GG, (RA PROBIOTIC DIGESTIVE CARE) CAPS Take 1 capsule by mouth daily.   Yes [provider]  ?lactulose (CHRONULAC) 10 GM/15ML solution Take 30 g by mouth daily.   Yes [provider]  ?levothyroxine (SYNTHROID) 100 MCG tablet Take 100 mcg by mouth daily before breakfast.   Yes [provider]  ?Magnesium Oxide 420 MG TABS Take 1 tablet by mouth 2 (two) times daily.   Yes [provider]  ?Melatonin 10 MG TABS Take 10 mg by mouth at bedtime.   Yes [provider]  ?Omega-3 Fatty Acids (FISH OIL) 1000 MG CAPS Take 1 capsule by mouth 2 (two) times daily.   Yes [provider]  ?rifaximin (XIFAXAN) 550 MG TABS tablet Take 550 mg by mouth 2 (two) times daily.   Yes [provider]  ?Semaglutide,0.25 or 0.5MG/DOS, 2 MG/1.5ML SOPN Inject 0.5 mg into the skin once a week.   Yes [provider]  ?terazosin (HYTRIN) 5 MG capsule Take 5 mg by mouth at bedtime.   Yes [provider]  ?vitamin C (ASCORBIC ACID) 500 MG tablet Take 1,000 mg by mouth daily.   Yes [provider]   ?vitamin E 180 MG (400 UNITS) capsule Take 400 Units by mouth at bedtime.   Yes [provider]  ?ferrous sulfate 325 (65 FE) MG tablet Take 1 tablet (325 mg total) by mouth daily with breakfast. 04/07/22   Lorella Nimrod, MD  ?furosemide (LASIX) 20 MG tablet Take 1 tablet (20 mg total) by mouth daily. 04/07/22   Lorella Nimrod, MD  ?pantoprazole (PROTONIX) 40 MG tablet Take 1 tablet (40 mg total) by mouth 2 (two) times daily. 04/07/22 06/06/22  Lorella Nimrod, MD  ? ? ?Inpatient Medications: ?Scheduled Meds: ? ascorbic acid  1,000 mg Oral Daily  ? atorvastatin  20 mg Oral Daily  ? cholecalciferol  2,000 Units Oral Daily  ? ferrous sulfate  325 mg Oral Q breakfast  ? fluticasone  1 spray Each Nare Daily  ? folic acid  1 mg Oral Daily  ? furosemide  20 mg Oral Daily  ? insulin aspart  0-5 Units Subcutaneous QHS  ? insulin aspart  0-9 Units Subcutaneous TID WC  ? insulin glargine-yfgn  10 Units Subcutaneous Daily  ? ketotifen  1 drop Both Eyes BID  ? lactulose  10 g Oral Daily  ? levothyroxine  100 mcg Oral Q0600  ? lidocaine  1 patch Transdermal Q24H  ? melatonin  10 mg Oral QHS  ? omega-3 acid ethyl esters  1 g Oral Daily  ? pantoprazole  40 mg Oral BID  ? rifaximin  550 mg Oral BID  ? terazosin  5 mg Oral QHS  ? ?Continuous Infusions: ? ?PRN Meds: ?acetaminophen, albuterol, hydrALAZINE, morphine injection, nitroGLYCERIN, ondansetron (ZOFRAN) IV ? ?Allergies:    ?Allergies  ?Allergen Reactions  ? Lisinopril Hives  ? Sulfamethoxazole Swelling  ? Camphor Rash and Swelling  ? Latex Rash  ? Nickel Rash  ? ? ?Social History:   ?Social History  ? ?Socioeconomic History  ? Marital status: Married  ?  Spouse name: Not on file  ? Number of children: Not on file  ? Years of education: Not on file  ? Highest education level: Not on file  ?Occupational History  ? Not on file  ?Tobacco Use  ? Smoking status: Former  ?  Packs/day: 0.50  ?  Years: 5.00  ?  Pack years: 2.50  ?  Types: Cigarettes  ?  Quit date: 10/16/1968  ?   Years since quitting: 53.5  ? Smokeless tobacco: Never  ?Vaping Use  ? Vaping Use: Never used  ?Substance and Sexual Activity  ? Alcohol use: Not Currently  ? Drug use: Not Currently  ? Sexual activity: Not on

## 2022-04-07 NOTE — Hospital Course (Signed)
Taken from H&P. ? ?Joshua Berry is a 76 y.o. male with medical history significant of hypertension, hyperlipidemia, diabetes mellitus, [name], liver cirrhosis, s/p of TIPS, GI bleeding, portal hypertension, anemia, melanoma, s/p of TAVR, dCHF, OSA, who presents with chest pain. ?  ?Patient states that he has chest pain intermittently for 2 days, which is located in the substernal area and lower chest, pressure-like, 6 out of 10 in severity, radiating to the back.  Associated with shortness of breath on exertion, no cough, fever or chills.  No nausea, vomiting, diarrhea or abdominal pain.  No symptoms of UTI.  No dark stool or rectal bleeding. ?  ?Cardiac cath at [place] on 08/27/21: ?Coronary Findings Diagnostic Dominance: Right  ?Left Main: The vessel was visualized by angiography, is moderate in size and is angiographically normal.  ?Left Anterior Descending: The vessel exhibits minimal luminal irregularities.  ?Left Circumflex: The vessel is small. The vessel exhibits minimal luminal irregularities.  ?Right Coronary Artery: The vessel was visualized by angiography, is large and is angiographically normal.  ? ?Intervention  ?No interventions have been documented.  ? ? ?Data Reviewed and ED Course: pt was found to have troponin level 7, GFR> 60, WBC 1.9, hemoglobin 10.8, platelet 44, BNP 8.3, temperature normal, soft blood pressure, 99/68, 102/57, HR 55, RR 20, oxygen saturation 98% on room air.  Chest x-ray negative.  CT angiogram is negative for dissection.  ? ?Cardiology was consulted.  Less chance of any ACS as troponin remain negative on subsequent check and EKG without any acute changes.  Pain to have some musculoskeletal/pleuritic component.  Also having some right shoulder pain which seems chronic as he will be going for steroid injection in 2 days. ? ?Patient was also found to have pancytopenia which seems chronic, hemoglobin and thrombocytopenia seems stable.  He will need a close  follow-up with his providers as an outpatient. ? ?Patient will continue his current medications and will follow-up with his providers. ? ?

## 2022-04-07 NOTE — Discharge Summary (Signed)
?Physician Discharge Summary ?  ?[name] MRN: [id number] DOB: [birth year]  ?Admit date:     04/06/2022  ?Discharge date: 04/07/22  ?Discharge Physician: Lorella Nimrod  ? ?PCP: Joshua Pont, MD  ? ?Recommendations at discharge:  ?Please obtain CBC and BMP in 1 week ?Follow-up with primary care provider and hepatologist. ?Follow-up with cardiologist according to your scheduled appointment. ? ?Discharge Diagnoses: ?Principal Problem: ?  Chest pain ?Active Problems: ?  Cirrhosis of liver with ascites ([place]) ?  Diabetes mellitus without complication ([place]) ?  HLD (hyperlipidemia) ?  HTN (hypertension) ?  Chronic diastolic CHF (congestive heart failure) ([place]) ?  Pancytopenia ([place]) ? ? ?Hospital Course: ?Taken from H&P. ? ?Joshua Berry is a 76 y.o. male with medical history significant of hypertension, hyperlipidemia, diabetes mellitus, Joshua Berry, liver cirrhosis, s/p of TIPS, GI bleeding, portal hypertension, anemia, melanoma, s/p of TAVR, dCHF, OSA, who presents with chest pain. ?  ?Patient states that he has chest pain intermittently for 2 days, which is located in the substernal area and lower chest, pressure-like, 6 out of 10 in severity, radiating to the back.  Associated with shortness of breath on exertion, no cough, fever or chills.  No nausea, vomiting, diarrhea or abdominal pain.  No symptoms of UTI.  No dark stool or rectal bleeding. ?  ?Cardiac cath at [place] on 08/27/21: ?Coronary Findings Diagnostic Dominance: Right  ?Left Main: The vessel was visualized by angiography, is moderate in size and is angiographically normal.  ?Left Anterior Descending: The vessel exhibits minimal luminal irregularities.  ?Left Circumflex: The vessel is small. The vessel exhibits minimal luminal irregularities.  ?Right Coronary Artery: The vessel was visualized by angiography, is large and is angiographically normal.  ? ?Intervention  ?No interventions have been documented.  ? ? ?Data Reviewed and ED  Course: pt was found to have troponin level 7, GFR> 60, WBC 1.9, hemoglobin 10.8, platelet 44, BNP 8.3, temperature normal, soft blood pressure, 99/68, 102/57, HR 55, RR 20, oxygen saturation 98% on room air.  Chest x-ray negative.  CT angiogram is negative for dissection.  ? ?Cardiology was consulted.  Less chance of any ACS as troponin remain negative on subsequent check and EKG without any acute changes.  Pain to have some musculoskeletal/pleuritic component.  Also having some right shoulder pain which seems chronic as he will be going for steroid injection in 2 days. ? ?Patient was also found to have pancytopenia which seems chronic, hemoglobin and thrombocytopenia seems stable.  He will need a close follow-up with his providers as an outpatient. ? ?Patient will continue his current medications and will follow-up with his providers. ? ? ?Assessment and Plan: ?* Chest pain ?Etiology is not clear.  CT angiograms negative for dissection, no central PE.  Patient had cardiac cath on 08/27/2021 which is negative for significant blockage.  Consulted with Dr. Sharene Skeans for cardiology ? ?- place to tele bed for observation ?- Trend Trop ?- prn Nitroglycerin, Morphine ?- aspirin, lipitor  ?- Risk factor stratification: will check FLP and A1C  ? ? ? ?Pancytopenia ([place]) ?Patient had WBC 2.0, hemoglobin 10.1, platelet 46 on 11/07/2021.  Today's WBC 1.9, hemoglobin 10.8, platelet 44.  Hemoglobin stable.  This is chronic issue. ?-Follow-up with CBC ?-Continue iron supplement ? ?Chronic diastolic CHF (congestive heart failure) ([place]) ?2D echo on 12/23/2018 showed EF of 55-65%.  Patient does not have leg edema or JVD.  No pulm edema on chest x-ray.  BNP is 8.3.  CHF seems to be compensated. ?-Continue home Lasix ? ?HTN (hypertension) ?- IV hydralazine as needed ?-Lasix ? ?HLD (hyperlipidemia) ?- Lipitor ? ?Diabetes mellitus without complication ([place]) ?Recent A1c 4.8.  Well-controlled.  Patient is a taking semaglutide, NovoLog and  Lantus 14 units daily ?-Lantus 10 units daily ?-Sliding scale insulin ? ?Cirrhosis of liver with ascites ([place]) ?Mental status normal. ?-Check INR, PTT, ammonia level ?-Continue rifaximin and lactulose ? ? ?Consultants: Cardiology ?Procedures performed: None ?Disposition: Home ?Diet recommendation:  ?Cardiac and Carb modified diet ?DISCHARGE MEDICATION: ?Allergies as of 04/07/2022   ? ?   Reactions  ? Lisinopril Hives  ? Sulfamethoxazole Swelling  ? Camphor Rash, Swelling  ? Latex Rash  ? Nickel Rash  ? ?  ? ?  ?Medication List  ?  ? ?STOP taking these medications   ? ?amoxicillin 500 MG capsule ?Commonly known as: AMOXIL ?  ? ?  ? ?TAKE these medications   ? ?aspirin EC 81 MG tablet ?Take 81 mg by mouth daily. ?  ?atorvastatin 20 MG tablet ?Commonly known as: LIPITOR ?Take 20 mg by mouth at bedtime. ?  ?augmented betamethasone dipropionate 0.05 % cream ?Commonly known as: DIPROLENE-AF ?Apply 1 application topically 2 (two) times daily. ?  ?Cholecalciferol 50 MCG (2000 UT) Tabs ?Take 1 tablet by mouth daily. ?  ?famotidine 20 MG tablet ?Commonly known as: PEPCID ?Take 20 mg by mouth daily. ?  ?ferrous sulfate 325 (65 FE) MG tablet ?Take 1 tablet (325 mg total) by mouth daily with breakfast. ?  ?Fish Oil 1000 MG Caps ?Take 1 capsule by mouth 2 (two) times daily. ?  ?folic acid 1 MG tablet ?Commonly known as: FOLVITE ?Take 1 tablet by mouth daily. ?  ?furosemide 20 MG tablet ?Commonly known as: LASIX ?Take 1 tablet (20 mg total) by mouth daily. ?What changed:  ?how much to take ?when to take this ?  ?Glycerin-Hypromellose-PEG 400 0.2-0.2-1 % Soln ?Apply 1 drop to eye 4 (four) times daily. ?  ?insulin aspart 100 UNIT/ML injection ?Commonly known as: novoLOG ?Inject 10 Units into the skin 3 (three) times daily before meals. ?What changed:  ?how much to take ?additional instructions ?  ?insulin glargine 100 UNIT/ML injection ?Commonly known as: LANTUS ?Inject 0.16 mLs (16 Units total) into the skin daily. ?What changed:  how much to take ?  ?ketotifen 0.025 % ophthalmic solution ?Commonly known as: ZADITOR ?Place 1 drop into both eyes 2 (two) times daily. ?  ?lactulose 10 GM/15ML solution ?Commonly known as: [place] ?Take 30 g by mouth daily. ?  ?levothyroxine 100 MCG tablet ?Commonly known as: SYNTHROID ?Take 100 mcg by mouth daily before breakfast. ?  ?Magnesium Oxide 420 MG Tabs ?Take 1 tablet by mouth 2 (two) times daily. ?  ?Melatonin 10 MG Tabs ?Take 10 mg by mouth at bedtime. ?  ?pantoprazole 40 MG tablet ?Commonly known as: Protonix ?Take 1 tablet (40 mg total) by mouth 2 (two) times daily. ?  ?RA Probiotic Digestive Care Caps ?Take 1 capsule by mouth daily. ?  ?rifaximin 550 MG Tabs tablet ?Commonly known as: XIFAXAN ?Take 550 mg by mouth 2 (two) times daily. ?  ?Semaglutide(0.25 or 0.5MG/DOS) 2 MG/1.5ML Sopn ?Inject 0.5 mg into the skin once a week. ?  ?terazosin 5 MG capsule ?Commonly known as: HYTRIN ?Take 5 mg by mouth at bedtime. ?  ?vitamin C 500 MG tablet ?Commonly known as: ASCORBIC ACID ?Take 1,000 mg by mouth daily. ?  ?vitamin E 180 MG (400 UNITS) capsule ?  Take 400 Units by mouth at bedtime. ?  ? ?  ? ? Follow-up Information   ? ? Joshua Pont, MD. Schedule an appointment as soon as possible for a visit in 1 week(s).   ?Specialty: Internal Medicine ?Contact information: ?[address] ?[place] [address] ?[phone] ? ? ?  ?  ? ?  ?  ? ?  ? ?Discharge Exam: ?Filed Weights  ? 04/07/22 0730  ?Weight: 104.3 kg  ? ?General.     In no acute distress. ?Pulmonary.  Lungs clear bilaterally, normal respiratory effort. ?CV.  Regular rate and rhythm, no JVD, rub or murmur. ?Abdomen.  Soft, nontender, nondistended, BS positive. ?CNS.  Alert and oriented .  No focal neurologic deficit. ?Extremities.  No edema, no cyanosis, pulses intact and symmetrical. ?Psychiatry.  Judgment and insight appears normal.  ? ?Condition at discharge: stable ? ?The results of significant diagnostics from this hospitalization  (including imaging, microbiology, ancillary and laboratory) are listed below for reference.  ? ?Imaging Studies: ?DG Chest 2 View ? ?Result Date: 04/06/2022 ?CLINICAL DATA:  Chest pain. Left-sided chest pain

## 2022-04-07 NOTE — ED Notes (Signed)
Pt requesting Afrin for nasal congestion. [place] notified. ?

## 2022-04-07 NOTE — ED Notes (Signed)
Per Dr. Reesa Chew, pt pending cardiology consult, once cleared pt may be discharged.  ?

## 2022-04-07 NOTE — ED Notes (Signed)
BG taken per pt request.  This RN assisted pt to stand at the side of the bed to use urinal.  Pt states that chest pressure has increased but he does not want to take any medication at this time. ?

## 2022-04-07 NOTE — ED Notes (Signed)
Breakfast tray given. °

## 2022-04-07 NOTE — ED Notes (Signed)
Pt states that he wears lidocaine patches on his R shoulder for his intermittent pain. Messaged Dr. Reesa Chew at this time.  ?

## 2022-04-08 ENCOUNTER — Ambulatory Visit: Payer: No Typology Code available for payment source

## 2022-04-10 ENCOUNTER — Ambulatory Visit: Payer: No Typology Code available for payment source

## 2022-04-11 ENCOUNTER — Ambulatory Visit: Payer: No Typology Code available for payment source

## 2022-04-15 ENCOUNTER — Ambulatory Visit: Payer: No Typology Code available for payment source

## 2022-04-17 ENCOUNTER — Ambulatory Visit: Payer: No Typology Code available for payment source

## 2022-04-18 ENCOUNTER — Ambulatory Visit: Payer: No Typology Code available for payment source

## 2022-04-22 ENCOUNTER — Ambulatory Visit: Payer: No Typology Code available for payment source

## 2022-04-24 ENCOUNTER — Ambulatory Visit: Payer: No Typology Code available for payment source

## 2022-04-25 ENCOUNTER — Ambulatory Visit: Payer: No Typology Code available for payment source

## 2022-04-29 ENCOUNTER — Ambulatory Visit: Payer: No Typology Code available for payment source

## 2022-05-01 ENCOUNTER — Ambulatory Visit: Payer: No Typology Code available for payment source

## 2022-05-02 ENCOUNTER — Ambulatory Visit: Payer: No Typology Code available for payment source

## 2022-05-06 ENCOUNTER — Ambulatory Visit: Payer: No Typology Code available for payment source

## 2022-05-08 ENCOUNTER — Other Ambulatory Visit: Payer: Self-pay | Admitting: Orthopedic Surgery

## 2022-05-08 ENCOUNTER — Ambulatory Visit: Payer: No Typology Code available for payment source

## 2022-05-08 DIAGNOSIS — G8929 Other chronic pain: Secondary | ICD-10-CM

## 2022-05-09 ENCOUNTER — Ambulatory Visit: Payer: No Typology Code available for payment source

## 2022-05-14 ENCOUNTER — Ambulatory Visit: Payer: No Typology Code available for payment source

## 2022-05-14 ENCOUNTER — Inpatient Hospital Stay: Payer: No Typology Code available for payment source

## 2022-05-14 ENCOUNTER — Emergency Department: Payer: No Typology Code available for payment source

## 2022-05-14 ENCOUNTER — Encounter: Payer: Self-pay | Admitting: Pulmonary Disease

## 2022-05-14 ENCOUNTER — Other Ambulatory Visit: Payer: Self-pay

## 2022-05-14 ENCOUNTER — Inpatient Hospital Stay
Admission: EM | Admit: 2022-05-14 | Discharge: 2022-05-28 | DRG: 871 | Disposition: A | Discharge status: Skilled Nursing Facility | Payer: No Typology Code available for payment source | Attending: Hospitalist | Admitting: Hospitalist

## 2022-05-14 DIAGNOSIS — W010XXA Fall on same level from slipping, tripping and stumbling without subsequent striking against object, initial encounter: Secondary | ICD-10-CM | POA: Diagnosis present

## 2022-05-14 DIAGNOSIS — E872 Acidosis, unspecified: Secondary | ICD-10-CM | POA: Diagnosis present

## 2022-05-14 DIAGNOSIS — E871 Hypo-osmolality and hyponatremia: Secondary | ICD-10-CM | POA: Diagnosis present

## 2022-05-14 DIAGNOSIS — R6521 Severe sepsis with septic shock: Secondary | ICD-10-CM | POA: Diagnosis present

## 2022-05-14 DIAGNOSIS — Z7985 Long-term (current) use of injectable non-insulin antidiabetic drugs: Secondary | ICD-10-CM

## 2022-05-14 DIAGNOSIS — K7682 Hepatic encephalopathy: Secondary | ICD-10-CM | POA: Diagnosis present

## 2022-05-14 DIAGNOSIS — E039 Hypothyroidism, unspecified: Secondary | ICD-10-CM | POA: Diagnosis present

## 2022-05-14 DIAGNOSIS — E669 Obesity, unspecified: Secondary | ICD-10-CM | POA: Diagnosis present

## 2022-05-14 DIAGNOSIS — I959 Hypotension, unspecified: Secondary | ICD-10-CM | POA: Diagnosis not present

## 2022-05-14 DIAGNOSIS — K3189 Other diseases of stomach and duodenum: Secondary | ICD-10-CM | POA: Diagnosis present

## 2022-05-14 DIAGNOSIS — Z20822 Contact with and (suspected) exposure to covid-19: Secondary | ICD-10-CM | POA: Diagnosis present

## 2022-05-14 DIAGNOSIS — I8500 Esophageal varices without bleeding: Secondary | ICD-10-CM | POA: Diagnosis present

## 2022-05-14 DIAGNOSIS — Z85820 Personal history of malignant melanoma of skin: Secondary | ICD-10-CM

## 2022-05-14 DIAGNOSIS — Z7982 Long term (current) use of aspirin: Secondary | ICD-10-CM

## 2022-05-14 DIAGNOSIS — R161 Splenomegaly, not elsewhere classified: Secondary | ICD-10-CM | POA: Diagnosis present

## 2022-05-14 DIAGNOSIS — R4182 Altered mental status, unspecified: Secondary | ICD-10-CM | POA: Diagnosis not present

## 2022-05-14 DIAGNOSIS — I5032 Chronic diastolic (congestive) heart failure: Secondary | ICD-10-CM | POA: Diagnosis present

## 2022-05-14 DIAGNOSIS — W19XXXA Unspecified fall, initial encounter: Secondary | ICD-10-CM

## 2022-05-14 DIAGNOSIS — D61818 Other pancytopenia: Secondary | ICD-10-CM | POA: Diagnosis present

## 2022-05-14 DIAGNOSIS — A411 Sepsis due to other specified staphylococcus: Principal | ICD-10-CM | POA: Diagnosis present

## 2022-05-14 DIAGNOSIS — Z6830 Body mass index (BMI) 30.0-30.9, adult: Secondary | ICD-10-CM

## 2022-05-14 DIAGNOSIS — A412 Sepsis due to unspecified staphylococcus: Secondary | ICD-10-CM | POA: Diagnosis not present

## 2022-05-14 DIAGNOSIS — I700 Atherosclerosis of aorta: Secondary | ICD-10-CM | POA: Diagnosis present

## 2022-05-14 DIAGNOSIS — M25511 Pain in right shoulder: Secondary | ICD-10-CM

## 2022-05-14 DIAGNOSIS — R748 Abnormal levels of other serum enzymes: Secondary | ICD-10-CM

## 2022-05-14 DIAGNOSIS — N179 Acute kidney failure, unspecified: Secondary | ICD-10-CM | POA: Diagnosis present

## 2022-05-14 DIAGNOSIS — N400 Enlarged prostate without lower urinary tract symptoms: Secondary | ICD-10-CM | POA: Diagnosis present

## 2022-05-14 DIAGNOSIS — M549 Dorsalgia, unspecified: Secondary | ICD-10-CM

## 2022-05-14 DIAGNOSIS — M5136 Other intervertebral disc degeneration, lumbar region: Secondary | ICD-10-CM | POA: Diagnosis present

## 2022-05-14 DIAGNOSIS — R5381 Other malaise: Secondary | ICD-10-CM

## 2022-05-14 DIAGNOSIS — B957 Other staphylococcus as the cause of diseases classified elsewhere: Secondary | ICD-10-CM | POA: Diagnosis not present

## 2022-05-14 DIAGNOSIS — Z952 Presence of prosthetic heart valve: Secondary | ICD-10-CM | POA: Diagnosis not present

## 2022-05-14 DIAGNOSIS — R579 Shock, unspecified: Secondary | ICD-10-CM | POA: Diagnosis present

## 2022-05-14 DIAGNOSIS — Z882 Allergy status to sulfonamides status: Secondary | ICD-10-CM

## 2022-05-14 DIAGNOSIS — Z87891 Personal history of nicotine dependence: Secondary | ICD-10-CM

## 2022-05-14 DIAGNOSIS — K7460 Unspecified cirrhosis of liver: Secondary | ICD-10-CM | POA: Diagnosis present

## 2022-05-14 DIAGNOSIS — Z888 Allergy status to other drugs, medicaments and biological substances status: Secondary | ICD-10-CM

## 2022-05-14 DIAGNOSIS — K766 Portal hypertension: Secondary | ICD-10-CM | POA: Diagnosis present

## 2022-05-14 DIAGNOSIS — Z79890 Hormone replacement therapy (postmenopausal): Secondary | ICD-10-CM

## 2022-05-14 DIAGNOSIS — E1165 Type 2 diabetes mellitus with hyperglycemia: Secondary | ICD-10-CM | POA: Diagnosis present

## 2022-05-14 DIAGNOSIS — R7881 Bacteremia: Secondary | ICD-10-CM | POA: Diagnosis not present

## 2022-05-14 DIAGNOSIS — K7581 Nonalcoholic steatohepatitis (NASH): Secondary | ICD-10-CM | POA: Diagnosis present

## 2022-05-14 DIAGNOSIS — E785 Hyperlipidemia, unspecified: Secondary | ICD-10-CM | POA: Diagnosis present

## 2022-05-14 DIAGNOSIS — I361 Nonrheumatic tricuspid (valve) insufficiency: Secondary | ICD-10-CM | POA: Diagnosis not present

## 2022-05-14 DIAGNOSIS — I110 Hypertensive heart disease with heart failure: Secondary | ICD-10-CM | POA: Diagnosis present

## 2022-05-14 DIAGNOSIS — M1990 Unspecified osteoarthritis, unspecified site: Secondary | ICD-10-CM | POA: Diagnosis present

## 2022-05-14 DIAGNOSIS — E876 Hypokalemia: Secondary | ICD-10-CM | POA: Diagnosis present

## 2022-05-14 DIAGNOSIS — G4733 Obstructive sleep apnea (adult) (pediatric): Secondary | ICD-10-CM | POA: Diagnosis present

## 2022-05-14 DIAGNOSIS — Z96611 Presence of right artificial shoulder joint: Secondary | ICD-10-CM | POA: Diagnosis present

## 2022-05-14 DIAGNOSIS — Z91040 Latex allergy status: Secondary | ICD-10-CM

## 2022-05-14 DIAGNOSIS — Z794 Long term (current) use of insulin: Secondary | ICD-10-CM

## 2022-05-14 DIAGNOSIS — Z79899 Other long term (current) drug therapy: Secondary | ICD-10-CM

## 2022-05-14 DIAGNOSIS — K7469 Other cirrhosis of liver: Secondary | ICD-10-CM | POA: Diagnosis not present

## 2022-05-14 DIAGNOSIS — E878 Other disorders of electrolyte and fluid balance, not elsewhere classified: Secondary | ICD-10-CM

## 2022-05-14 DIAGNOSIS — Z9049 Acquired absence of other specified parts of digestive tract: Secondary | ICD-10-CM

## 2022-05-14 LAB — URINALYSIS, COMPLETE (UACMP) WITH MICROSCOPIC
Bacteria, UA: NONE SEEN
Bilirubin Urine: NEGATIVE
Glucose, UA: NEGATIVE mg/dL
Hgb urine dipstick: NEGATIVE
Ketones, ur: NEGATIVE mg/dL
Leukocytes,Ua: NEGATIVE
Nitrite: NEGATIVE
Protein, ur: NEGATIVE mg/dL
Specific Gravity, Urine: 1.021 (ref 1.005–1.030)
Squamous Epithelial / HPF: NONE SEEN (ref 0–5)
pH: 7 (ref 5.0–8.0)

## 2022-05-14 LAB — COMPREHENSIVE METABOLIC PANEL
ALT: 57 U/L — ABNORMAL HIGH (ref 0–44)
AST: 76 U/L — ABNORMAL HIGH (ref 15–41)
Albumin: 3.3 g/dL — ABNORMAL LOW (ref 3.5–5.0)
Alkaline Phosphatase: 86 U/L (ref 38–126)
Anion gap: 12 (ref 5–15)
BUN: 18 mg/dL (ref 8–23)
CO2: 20 mmol/L — ABNORMAL LOW (ref 22–32)
Calcium: 8.8 mg/dL — ABNORMAL LOW (ref 8.9–10.3)
Chloride: 104 mmol/L (ref 98–111)
Creatinine, Ser: 1.29 mg/dL — ABNORMAL HIGH (ref 0.61–1.24)
GFR, Estimated: 57 mL/min — ABNORMAL LOW (ref >60)
Glucose, Bld: 185 mg/dL — ABNORMAL HIGH (ref 70–99)
Potassium: 4 mmol/L (ref 3.5–5.1)
Sodium: 136 mmol/L (ref 135–145)
Total Bilirubin: 2.3 mg/dL — ABNORMAL HIGH (ref 0.3–1.2)
Total Protein: 6.8 g/dL (ref 6.5–8.1)

## 2022-05-14 LAB — CBC WITH DIFFERENTIAL/PLATELET
Abs Immature Granulocytes: 0.02 10*3/uL (ref 0.00–0.07)
Basophils Absolute: 0 10*3/uL (ref 0.0–0.1)
Basophils Relative: 1 %
Eosinophils Absolute: 0.1 10*3/uL (ref 0.0–0.5)
Eosinophils Relative: 1 %
HCT: 34.1 % — ABNORMAL LOW (ref 39.0–52.0)
Hemoglobin: 11.8 g/dL — ABNORMAL LOW (ref 13.0–17.0)
Immature Granulocytes: 1 %
Lymphocytes Relative: 7 %
Lymphs Abs: 0.3 10*3/uL — ABNORMAL LOW (ref 0.7–4.0)
MCH: 32.8 pg (ref 26.0–34.0)
MCHC: 34.6 g/dL (ref 30.0–36.0)
MCV: 94.7 fL (ref 80.0–100.0)
Monocytes Absolute: 0.3 10*3/uL (ref 0.1–1.0)
Monocytes Relative: 8 %
Neutro Abs: 3.5 10*3/uL (ref 1.7–7.7)
Neutrophils Relative %: 82 %
Platelets: 35 10*3/uL — ABNORMAL LOW (ref 150–400)
RBC: 3.6 MIL/uL — ABNORMAL LOW (ref 4.22–5.81)
RDW: 14.7 % (ref 11.5–15.5)
WBC: 4.2 10*3/uL (ref 4.0–10.5)
nRBC: 0 % (ref 0.0–0.2)

## 2022-05-14 LAB — MAGNESIUM: Magnesium: 1.7 mg/dL (ref 1.7–2.4)

## 2022-05-14 LAB — BLOOD GAS, ARTERIAL
Acid-base deficit: 0.8 mmol/L (ref 0.0–2.0)
Bicarbonate: 21.1 mmol/L (ref 20.0–28.0)
O2 Saturation: 97.8 %
Patient temperature: 37
pCO2 arterial: 27 mmHg — ABNORMAL LOW (ref 32–48)
pH, Arterial: 7.5 — ABNORMAL HIGH (ref 7.35–7.45)
pO2, Arterial: 76 mmHg — ABNORMAL LOW (ref 83–108)

## 2022-05-14 LAB — GLUCOSE, CAPILLARY: Glucose-Capillary: 193 mg/dL — ABNORMAL HIGH (ref 70–99)

## 2022-05-14 LAB — PHOSPHORUS: Phosphorus: 2.8 mg/dL (ref 2.5–4.6)

## 2022-05-14 LAB — RESP PANEL BY RT-PCR (FLU A&B, COVID) ARPGX2
Influenza A by PCR: NEGATIVE
Influenza B by PCR: NEGATIVE
SARS Coronavirus 2 by RT PCR: NEGATIVE

## 2022-05-14 LAB — TROPONIN I (HIGH SENSITIVITY)
Troponin I (High Sensitivity): 14 ng/L (ref <18)
Troponin I (High Sensitivity): 12 ng/L (ref <18)

## 2022-05-14 LAB — PROCALCITONIN: Procalcitonin: 0.14 ng/mL

## 2022-05-14 LAB — LACTIC ACID, PLASMA
Lactic Acid, Venous: 3.6 mmol/L (ref 0.5–1.9)
Lactic Acid, Venous: 3.6 mmol/L (ref 0.5–1.9)
Lactic Acid, Venous: 2.9 mmol/L (ref 0.5–1.9)

## 2022-05-14 LAB — AMMONIA: Ammonia: 72 umol/L — ABNORMAL HIGH (ref 9–35)

## 2022-05-14 LAB — STREP PNEUMONIAE URINARY ANTIGEN: Strep Pneumo Urinary Antigen: NEGATIVE

## 2022-05-14 LAB — MRSA NEXT GEN BY PCR, NASAL: MRSA by PCR Next Gen: NOT DETECTED

## 2022-05-14 LAB — D-DIMER, QUANTITATIVE: D-Dimer, Quant: 8.79 ug/mL-FEU — ABNORMAL HIGH (ref 0.00–0.50)

## 2022-05-14 MED ORDER — VANCOMYCIN HCL IN DEXTROSE 1-5 GM/200ML-% IV SOLN
1000.0000 mg | Freq: Once | INTRAVENOUS | Status: AC
  Administered 2022-05-14: 1000 mg via INTRAVENOUS
  Filled 2022-05-14: qty 200

## 2022-05-14 MED ORDER — LACTATED RINGERS IV BOLUS (SEPSIS)
500.0000 mL | Freq: Once | INTRAVENOUS | Status: AC
  Administered 2022-05-14: 500 mL via INTRAVENOUS

## 2022-05-14 MED ORDER — PANTOPRAZOLE SODIUM 40 MG IV SOLR
40.0000 mg | Freq: Every day | INTRAVENOUS | Status: DC
Start: 2022-05-14 — End: 2022-05-16
  Administered 2022-05-14 – 2022-05-15 (×2): 40 mg via INTRAVENOUS
  Filled 2022-05-14 (×2): qty 10

## 2022-05-14 MED ORDER — NOREPINEPHRINE 4 MG/250ML-% IV SOLN
0.0000 ug/min | INTRAVENOUS | Status: DC
  Administered 2022-05-14: 2 ug/min via INTRAVENOUS
  Filled 2022-05-14: qty 250

## 2022-05-14 MED ORDER — AZITHROMYCIN 500 MG IV SOLR
500.0000 mg | INTRAVENOUS | Status: DC
  Administered 2022-05-14: 500 mg via INTRAVENOUS
  Filled 2022-05-14 (×2): qty 5

## 2022-05-14 MED ORDER — ASPIRIN 81 MG PO CHEW: 324.0000 mg | CHEWABLE_TABLET | ORAL | Status: DC

## 2022-05-14 MED ORDER — POLYETHYLENE GLYCOL 3350 17 G PO PACK: 17.0000 g | PACK | Freq: Every day | ORAL | Status: DC | PRN

## 2022-05-14 MED ORDER — CHLORHEXIDINE GLUCONATE CLOTH 2 % EX PADS
6.0000 | MEDICATED_PAD | Freq: Every day | CUTANEOUS | Status: DC
  Administered 2022-05-14 – 2022-05-28 (×11): 6 via TOPICAL

## 2022-05-14 MED ORDER — SODIUM CHLORIDE 0.9 % IV SOLN
2.0000 g | INTRAVENOUS | Status: DC
  Administered 2022-05-14: 2 g via INTRAVENOUS
  Filled 2022-05-14: qty 20

## 2022-05-14 MED ORDER — IOHEXOL 350 MG/ML SOLN
75.0000 mL | Freq: Once | INTRAVENOUS | Status: AC | PRN
  Administered 2022-05-14: 75 mL via INTRAVENOUS

## 2022-05-14 MED ORDER — LACTATED RINGERS IV BOLUS (SEPSIS)
1000.0000 mL | Freq: Once | INTRAVENOUS | Status: AC
  Administered 2022-05-14: 1000 mL via INTRAVENOUS

## 2022-05-14 MED ORDER — VANCOMYCIN HCL 1250 MG/250ML IV SOLN
1250.0000 mg | Freq: Once | INTRAVENOUS | Status: AC
  Administered 2022-05-14: 1250 mg via INTRAVENOUS
  Filled 2022-05-14: qty 250

## 2022-05-14 MED ORDER — ASPIRIN 300 MG RE SUPP: 300.0000 mg | RECTAL | Status: DC

## 2022-05-14 MED ORDER — CEFEPIME HCL 2 G IV SOLR
2.0000 g | Freq: Once | INTRAVENOUS | Status: AC
  Administered 2022-05-14: 2 g via INTRAVENOUS
  Filled 2022-05-14: qty 12.5

## 2022-05-14 NOTE — ED Provider Notes (Signed)
[facility] Provider Note    Event Date/Time   First MD Initiated Contact with Patient 05/14/22 1102     (approximate)   History   Altered Mental Status   HPI  Joshua Berry is a 76 y.o. male with history of diabetes, liver cirrhosis, diastolic CHF, hypertension, and hyperlipidemia who presents with generalized weakness for the last several days and altered mental status.  He has not been able to walk around, and has seemed somnolent and weak.  The patient himself denies any acute complaints except for some neck and back pain after he fell a couple of days ago due to his weakness.   Physical Exam   Triage Vital Signs: ED Triage Vitals  Enc Vitals Group     BP 05/14/22 1100 (!) 86/48     Pulse Rate 05/14/22 1100 89     Resp 05/14/22 1100 16     Temp 05/14/22 1100 100.1 F (37.8 C)     Temp Source 05/14/22 1100 Oral     SpO2 05/14/22 1100 99 %     Weight 05/14/22 1102 230 lb (104.3 kg)     Height 05/14/22 1102 6' 2"  (1.88 m)     Head Circumference --      Peak Flow --      Pain Score 05/14/22 1101 0     Pain Loc --      Pain Edu? --      Excl. in [place]? --     Most recent vital signs: Vitals:   05/14/22 1430 05/14/22 1500  BP: (!) 129/57 (!) 122/52  Pulse: 68 76  Resp: (!) 22 (!) 21  Temp:    SpO2: 97% 97%     General: Tired and weak appearing but arousable, oriented x2. CV:  Good peripheral perfusion.  Resp:  Normal effort.  Lungs CTA B. Abd:  No distention.  Soft and nontender. Other:  EOMI.  PERRLA.  Motor intact in all extremities.  Dry mucous membranes.  No significant midline cervical spinal tenderness.  Neck with full ROM.  No meningeal signs.   ED Results / Procedures / Treatments   Labs (all labs ordered are listed, but only abnormal results are displayed) Labs Reviewed  AMMONIA - Abnormal; Notable for the following components:      Result Value   Ammonia 72 (*)    All other components within normal limits  LACTIC ACID,  PLASMA - Abnormal; Notable for the following components:   Lactic Acid, Venous 3.6 (*)    All other components within normal limits  LACTIC ACID, PLASMA - Abnormal; Notable for the following components:   Lactic Acid, Venous 3.6 (*)    All other components within normal limits  COMPREHENSIVE METABOLIC PANEL - Abnormal; Notable for the following components:   CO2 20 (*)    Glucose, Bld 185 (*)    Creatinine, Ser 1.29 (*)    Calcium 8.8 (*)    Albumin 3.3 (*)    AST 76 (*)    ALT 57 (*)    Total Bilirubin 2.3 (*)    GFR, Estimated 57 (*)    All other components within normal limits  CBC WITH DIFFERENTIAL/PLATELET - Abnormal; Notable for the following components:   RBC 3.60 (*)    Hemoglobin 11.8 (*)    HCT 34.1 (*)    Platelets 35 (*)    Lymphs Abs 0.3 (*)    All other components within normal limits  URINALYSIS, COMPLETE (  UACMP) WITH MICROSCOPIC - Abnormal; Notable for the following components:   Color, Urine YELLOW (*)    APPearance CLEAR (*)    All other components within normal limits  RESP PANEL BY RT-PCR (FLU A&B, COVID) ARPGX2  CULTURE, BLOOD (ROUTINE X 2)  CULTURE, BLOOD (ROUTINE X 2)  LACTIC ACID, PLASMA  TROPONIN I (HIGH SENSITIVITY)  TROPONIN I (HIGH SENSITIVITY)     EKG  ED ECG REPORT I, Arta Silence, the attending physician, personally viewed and interpreted this ECG.  Date: 05/14/2022 EKG Time: 1118 Rate: 84 Rhythm: normal sinus rhythm with PVCs QRS Axis: Left axis Intervals: Prolonged QTc ST/T Wave abnormalities: Nonspecific abnormalities Narrative Interpretation: Nonspecific abnormalities with no evidence of acute ischemia    RADIOLOGY  CT head: I independently viewed and interpreted the images; there is no ICH or other acute abnormality  CT cervical spine: No acute fracture  Chest x-ray: No edema or consolidation  PROCEDURES:  Critical Care performed: Yes, see critical care procedure note(s)  .Critical Care Performed by: Arta Silence, MD Authorized by: Arta Silence, MD   Critical care provider statement:    Critical care time (minutes):  50   Critical care was necessary to treat or prevent imminent or life-threatening deterioration of the following conditions:  Shock and sepsis   Critical care was time spent personally by me on the following activities:  Development of treatment plan with patient or surrogate, discussions with consultants, evaluation of patient's response to treatment, examination of patient, ordering and review of laboratory studies, ordering and review of radiographic studies, ordering and performing treatments and interventions, pulse oximetry, re-evaluation of patient's condition and review of old charts   Care discussed with: admitting provider     MEDICATIONS ORDERED IN ED: Medications  norepinephrine (LEVOPHED) 52m in 2552m(0.016 mg/mL) premix infusion (0 mcg/min Intravenous Paused 05/14/22 1506)  lactated ringers bolus 1,000 mL (0 mLs Intravenous Stopped 05/14/22 1233)  lactated ringers bolus 1,000 mL (0 mLs Intravenous Stopped 05/14/22 1301)    And  lactated ringers bolus 1,000 mL (0 mLs Intravenous Stopped 05/14/22 1407)    And  lactated ringers bolus 500 mL (0 mLs Intravenous Stopped 05/14/22 1352)  ceFEPIme (MAXIPIME) 2 g in sodium chloride 0.9 % 100 mL IVPB (0 g Intravenous Stopped 05/14/22 1300)  vancomycin (VANCOCIN) IVPB 1000 mg/200 mL premix (0 mg Intravenous Stopped 05/14/22 1331)  vancomycin (VANCOREADY) IVPB 1250 mg/250 mL (0 mg Intravenous Stopped 05/14/22 1435)     IMPRESSION / [place]  I reviewed the triage vital signs and the nursing notes.  [age]ear old male with PMH as noted above presents with generalized weakness and altered mental status for last several days.  He fell once and is reporting some neck and upper back pain.  I reviewed the past medical records.  The patient was most recently admitted in April with chest pain with  negative work-up, and pancytopenia.  On exam currently, the patient is somewhat tired and weak appearing but arousable and oriented x2.  He was significantly hypotensive shortly after ED arrival with a borderline elevated temperature and otherwise normal vital signs.  Neurologic exam is nonfocal.  Mucous membranes appear very dry.  The rest of the exam is as described above.  Differential diagnosis includes, but is not limited to, acute infection/sepsis, dehydration, electrolyte abnormality, AKI, other metabolic disturbance, or less likely primary CNS cause.  We will obtain CT head, cervical spine, chest x-ray, lab work-up including sepsis labs, give  fluids, and reassess.  Patient's presentation is most consistent with acute presentation with potential threat to life or bodily function.  The patient is on the cardiac monitor to evaluate for evidence of arrhythmia and/or significant heart rate changes.  ----------------------------------------- 2:09 PM on 05/14/2022 -----------------------------------------  The patient remained hypotensive and lactate was elevated.  I ordered empiric IV antibiotics and fluids per the sepsis protocol.  CT head and cervical spine are negative.  Chest x-ray is clear.  Urinalysis is pending.  Lab work-up reveals elevated lactate, negative respiratory panel, and somewhat elevated ammonia.  Blood pressure improved with fluids although map is still just below 65 so I have ordered a Levophed infusion.  I consulted Dr. Lanney Gins from the ICU; based on our discussion he agrees to evaluate the patient for admission.   FINAL CLINICAL IMPRESSION(S) / ED DIAGNOSES   Final diagnoses:  Altered mental status, unspecified altered mental status type  Hypotension, unspecified hypotension type     Rx / DC Orders   ED Discharge Orders     None        Note:  This document was prepared using Dragon voice recognition software and may include unintentional dictation  errors.    Arta Silence, MD 05/14/22 1535

## 2022-05-14 NOTE — ED Triage Notes (Signed)
Pt from home with AMS and decreased ability to move around x 2-3 days. Pt's wife reports he is altered from baseline. Pt oriented to person and place. Pt hypotensive and febrile on arrival.

## 2022-05-14 NOTE — Sepsis Progress Note (Signed)
ELink tracking the Code Sepsis. 

## 2022-05-14 NOTE — Consult Note (Signed)
PHARMACY CONSULT NOTE  Pharmacy Consult for Electrolyte Monitoring and Replacement   Recent Labs: Potassium (mmol/L)  Date Value  05/14/2022 4.0  05/01/2014 4.2   Magnesium (mg/dL)  Date Value  08/20/2019 1.9   Calcium (mg/dL)  Date Value  05/14/2022 8.8 (L)   Calcium, Total (mg/dL)  Date Value  05/01/2014 9.4   Albumin (g/dL)  Date Value  05/14/2022 3.3 (L)  05/01/2014 4.2   Phosphorus (mg/dL)  Date Value  07/22/2019 4.0   Sodium (mmol/L)  Date Value  05/14/2022 136  05/01/2014 136   Assessment: Patient is a 76 y/o M with medical history including HTN, HLD, DM, liver cirrhosis s/t NASH, s/p TIPS, GIB, portal hypertension c/b ascites, anemia, melanoma, s/p TAVR, diastolic CHF, OSA who presented to the ED 5/30 with AMS x 2-3 days. Patient subsequently found to be in shock. Disposition is the ICU. Pharmacy consulted to assist with electrolyte monitoring and replacement as indicated.  Goal of Therapy:  Electrolytes within normal limits  Plan:  --No electrolyte replacement indicated at this time --Follow-up electrolytes with AM labs tomorrow  Benita Gutter 05/14/2022 4:12 PM

## 2022-05-14 NOTE — ED Notes (Signed)
Report given to [facility] in CCU.

## 2022-05-14 NOTE — ED Notes (Signed)
Pt resting comfortably, breathing easy and unlabored, male purick intact with amber colored urine. Zithromax started, family at the bedside, advise to call for help.

## 2022-05-14 NOTE — H&P (Signed)
CRITICAL CARE     Name: Joshua Berry MRN: [id number] DOB: 07/09/1946     LOS: 0   SUBJECTIVE FINDINGS & SIGNIFICANT EVENTS    Patient description:  This is a 76 year old male with history of diabetes, liver cirrhosis, diastolic CHF essential hypertension dyslipidemia came in with malaise and altered mental status.  Vital signs showed shock physiology status post 3.5 L resuscitative IV fluids with minimal improvement requiring Levophed vasopressor support.  Also low-grade fever over 100.1 noted on admission.  CBC with severe thrombocytopenia at 35, BMP showing AKI and mild lactic acidosis.  Urinalysis is reassuring without leukocyte esterase normal pH and no ketones.  During my evaluation patient is lethargic and noncommunicative requiring low-dose Levophed peripherally.  Lines/tubes : External Urinary Catheter (Active)  Collection Container Dedicated Suction Canister 05/14/22 1343  Suction (Verified suction is between 40-80 mmHg) Yes 05/14/22 1343  Securement Method Mesh underwear 05/14/22 1343  Site Assessment Clean, Dry, Intact 05/14/22 1343    Microbiology/Sepsis markers: Results for orders placed or performed during the hospital encounter of 05/14/22  Resp Panel by RT-PCR (Flu A&B, Covid) Anterior Nasal Swab     Status: None   Collection Time: 05/14/22 12:04 PM   Specimen: Anterior Nasal Swab  Result Value Ref Range Status   SARS Coronavirus 2 by RT PCR NEGATIVE NEGATIVE Final    Comment: (NOTE) SARS-CoV-2 target nucleic acids are NOT DETECTED.  The SARS-CoV-2 RNA is generally detectable in upper respiratory specimens during the acute phase of infection. The lowest concentration of SARS-CoV-2 viral copies this assay can detect is 138 copies/mL. A negative result does not preclude SARS-Cov-2 infection  and should not be used as the sole basis for treatment or other patient management decisions. A negative result may occur with  improper specimen collection/handling, submission of specimen other than nasopharyngeal swab, presence of viral mutation(s) within the areas targeted by this assay, and inadequate number of viral copies(<138 copies/mL). A negative result must be combined with clinical observations, patient history, and epidemiological information. The expected result is Negative.  Fact Sheet for Patients:  EntrepreneurPulse.com.au  Fact Sheet for Healthcare Providers:  IncredibleEmployment.be  This test is no t yet approved or cleared by the [place] FDA and  has been authorized for detection and/or diagnosis of SARS-CoV-2 by FDA under an Emergency Use Authorization (EUA). This EUA will remain  in effect (meaning this test can be used) for the duration of the COVID-19 declaration under Section 564(b)(1) of the Act, 21 U.S.C.section 360bbb-3(b)(1), unless the authorization is terminated  or revoked sooner.       Influenza A by PCR NEGATIVE NEGATIVE Final   Influenza B by PCR NEGATIVE NEGATIVE Final    Comment: (NOTE) The Xpert Xpress SARS-CoV-2/FLU/RSV plus assay is intended as an aid in the diagnosis of influenza from Nasopharyngeal swab specimens and should not be used as a sole basis for treatment. Nasal washings and aspirates are unacceptable for Xpert Xpress SARS-CoV-2/FLU/RSV testing.  Fact Sheet for Patients: EntrepreneurPulse.com.au  Fact Sheet for Healthcare Providers: IncredibleEmployment.be  This test is not yet approved or cleared by the [place] FDA and has been authorized for detection and/or diagnosis of SARS-CoV-2 by FDA under an Emergency Use Authorization (EUA). This EUA will remain in effect (meaning this test can be used) for the duration of the COVID-19 declaration  under Section 564(b)(1) of the Act, 21 U.S.C. section 360bbb-3(b)(1), unless the authorization is terminated or revoked.  Performed at [facility], [address] [place], [place] [address]     Anti-infectives:  Anti-infectives (From admission, onward)    Start     Dose/Rate Route Frequency Ordered Stop   05/14/22 1330  vancomycin (VANCOREADY) IVPB 1250 mg/250 mL        1,250 mg 166.7 mL/hr over 90 Minutes Intravenous  Once 05/14/22 1220 05/14/22 1435   05/14/22 1215  ceFEPIme (MAXIPIME) 2 g in sodium chloride 0.9 % 100 mL IVPB        2 g 200 mL/hr over 30 Minutes Intravenous  Once 05/14/22 1202 05/14/22 1300   05/14/22 1215  vancomycin (VANCOCIN) IVPB 1000 mg/200 mL premix        1,000 mg 200 mL/hr over 60 Minutes Intravenous  Once 05/14/22 1202 05/14/22 1331       PAST MEDICAL HISTORY   Past Medical History:  Diagnosis Date   Arthritis    Bleeding ulcer    Cancer ([place])    melanoma, carcinoma   Cirrhosis of liver ([place])    Diabetes mellitus without complication ([place])    Heart murmur    Hypertension    Iron deficiency anemia    NASH (nonalcoholic steatohepatitis)    Sleep apnea    Spleen enlarged    Thrombocytopenia ([place])      SURGICAL HISTORY   Past Surgical History:  Procedure Laterality Date   CHOLECYSTECTOMY     COLONOSCOPY N/A 07/23/2019   Procedure: COLONOSCOPY;  Surgeon: Lin Landsman, MD;  Location: [facility] ENDOSCOPY;  Service: Gastroenterology;  Laterality: N/A;   ESOPHAGOGASTRODUODENOSCOPY N/A 07/20/2019   Procedure: ESOPHAGOGASTRODUODENOSCOPY (EGD);  Surgeon: Lin Landsman, MD;  Location: [facility] ENDOSCOPY;  Service: Gastroenterology;  Laterality: N/A;   ESOPHAGOGASTRODUODENOSCOPY Left 08/20/2019   Procedure: ESOPHAGOGASTRODUODENOSCOPY (EGD);  Surgeon: Virgel Manifold, MD;  Location: [facility] ENDOSCOPY;  Service: Endoscopy;  Laterality: Left;   ESOPHAGOGASTRODUODENOSCOPY (EGD) WITH PROPOFOL N/A 04/25/2021   Procedure:  ESOPHAGOGASTRODUODENOSCOPY (EGD) WITH PROPOFOL;  Surgeon: Lin Landsman, MD;  Location: [facility] ENDOSCOPY;  Service: Gastroenterology;  Laterality: N/A;   SKIN BIOPSY     TEE WITHOUT CARDIOVERSION N/A 12/23/2018   Procedure: TRANSESOPHAGEAL ECHOCARDIOGRAM (TEE);  Surgeon: Teodoro Spray, MD;  Location: [facility] ORS;  Service: Cardiovascular;  Laterality: N/A;     FAMILY HISTORY   Family History  Problem Relation Age of Onset   Heart attack Brother      SOCIAL HISTORY   Social History   Tobacco Use   Smoking status: Former    Packs/day: 0.50    Years: 5.00    Pack years: 2.50    Types: Cigarettes    Quit date: 10/16/1968    Years since quitting: 53.6   Smokeless tobacco: Never  Vaping Use   Vaping Use: Never used  Substance Use Topics   Alcohol use: Not Currently   Drug use: Not Currently     MEDICATIONS   Current Medication:  Current Facility-Administered Medications:    aspirin chewable tablet 324 mg, 324 mg, Oral, NOW **OR** aspirin suppository 300 mg, 300 mg, Rectal, NOW, Lanney Gins, Kelvin Burpee, MD   norepinephrine (LEVOPHED) 25m in 2558m(0.016 mg/mL) premix infusion, 0-40 mcg/min, Intravenous, Continuous, Siadecki, SeFelix AhmadiMD, Paused at 05/14/22 1506   pantoprazole (PROTONIX) injection 40 mg, 40 mg, Intravenous, QHS, Cleo Santucci, MD   polyethylene glycol (MIRALAX / GLYCOLAX) packet 17 g, 17 g, Oral, Daily PRN, AlOttie GlazierMD  Current Outpatient Medications:    aspirin EC 81 MG tablet, Take 81 mg by mouth daily., Disp: , Rfl:  atorvastatin (LIPITOR) 20 MG tablet, Take 20 mg by mouth at bedtime., Disp: , Rfl:    augmented betamethasone dipropionate (DIPROLENE-AF) 0.05 % cream, Apply 1 application topically 2 (two) times daily., Disp: , Rfl:    Cholecalciferol 50 MCG (2000 UT) TABS, Take 1 tablet by mouth daily., Disp: , Rfl:    famotidine (PEPCID) 20 MG tablet, Take 20 mg by mouth daily., Disp: , Rfl:    folic acid (FOLVITE) 1 MG tablet, Take 1 tablet by  mouth daily., Disp: , Rfl:    furosemide (LASIX) 20 MG tablet, Take 1 tablet (20 mg total) by mouth daily., Disp: 30 tablet, Rfl: 1   Glycerin-Hypromellose-PEG 400 0.2-0.2-1 % SOLN, Apply 1 drop to eye 4 (four) times daily., Disp: , Rfl:    insulin aspart (NOVOLOG) 100 UNIT/ML injection, Inject 10 Units into the skin 3 (three) times daily before meals. (Patient taking differently: Inject 16-22 Units into the skin 3 (three) times daily before meals. (Based on blood glucose reading)), Disp: 10 mL, Rfl: 11   insulin glargine (LANTUS) 100 UNIT/ML injection, Inject 0.16 mLs (16 Units total) into the skin daily. (Patient taking differently: Inject 14 Units into the skin daily.), Disp: 10 mL, Rfl: 11   ketotifen (ZADITOR) 0.025 % ophthalmic solution, Place 1 drop into both eyes 2 (two) times daily., Disp: , Rfl:    Lactobacillus Rhamnosus, GG, (RA PROBIOTIC DIGESTIVE CARE) CAPS, Take 1 capsule by mouth daily., Disp: , Rfl:    lactulose (CHRONULAC) 10 GM/15ML solution, Take 30 g by mouth daily., Disp: , Rfl:    levothyroxine (SYNTHROID) 100 MCG tablet, Take 100 mcg by mouth daily before breakfast., Disp: , Rfl:    Magnesium Oxide 420 MG TABS, Take 1 tablet by mouth 2 (two) times daily., Disp: , Rfl:    Melatonin 10 MG TABS, Take 10 mg by mouth at bedtime., Disp: , Rfl:    Omega-3 Fatty Acids (FISH OIL) 1000 MG CAPS, Take 1 capsule by mouth 2 (two) times daily., Disp: , Rfl:    pantoprazole (PROTONIX) 40 MG tablet, Take 1 tablet (40 mg total) by mouth 2 (two) times daily., Disp: 30 tablet, Rfl: 3   rifaximin (XIFAXAN) 550 MG TABS tablet, Take 550 mg by mouth 2 (two) times daily., Disp: , Rfl:    Semaglutide,0.25 or 0.5MG/DOS, 2 MG/1.5ML SOPN, Inject 0.5 mg into the skin once a week., Disp: , Rfl:    terazosin (HYTRIN) 5 MG capsule, Take 5 mg by mouth at bedtime., Disp: , Rfl:    vitamin C (ASCORBIC ACID) 500 MG tablet, Take 1,000 mg by mouth daily., Disp: , Rfl:    ferrous sulfate 325 (65 FE) MG tablet, Take  1 tablet (325 mg total) by mouth daily with breakfast. (Patient not taking: Reported on 05/14/2022), Disp: , Rfl:    vitamin E 180 MG (400 UNITS) capsule, Take 400 Units by mouth at bedtime. (Patient not taking: Reported on 05/14/2022), Disp: , Rfl:     ALLERGIES   Lisinopril, Sulfamethoxazole, Camphor, Latex, and Nickel    REVIEW OF SYSTEMS     Unable to obtain due to altered mental status with lethargy  PHYSICAL EXAMINATION   Vital Signs: Temp:  [100.1 F (37.8 C)] 100.1 F (37.8 C) (05/30 1100) Pulse Rate:  [47-91] 89 (05/30 1540) Resp:  [12-23] 22 (05/30 1540) BP: (75-130)/(41-59) 130/58 (05/30 1540) SpO2:  [96 %-99 %] 97 % (05/30 1540) Weight:  [104.3 kg] 104.3 kg (05/30 1102)  GENERAL: Age-appropriate critically ill HEAD:  Normocephalic, atraumatic.  EYES: Pupils equal, round, reactive to light.  No scleral icterus.  MOUTH: Moist mucosal membrane. NECK: Supple. No thyromegaly. No nodules. No JVD.  PULMONARY: Crackles at the bases bilaterally CARDIOVASCULAR: S1 and S2. Regular rate and rhythm. No murmurs, rubs, or gallops.  GASTROINTESTINAL: Soft, nontender, non-distended. No masses. Positive bowel sounds. No hepatosplenomegaly.  MUSCULOSKELETAL: No swelling, clubbing, or edema.  NEUROLOGIC: GCS 10 with lethargy SKIN:intact,warm,dry   PERTINENT DATA     Infusions:  norepinephrine (LEVOPHED) Adult infusion Stopped (05/14/22 1506)   Scheduled Medications:  aspirin  324 mg Oral NOW   Or   aspirin  300 mg Rectal NOW   pantoprazole (PROTONIX) IV  40 mg Intravenous QHS   PRN Medications: polyethylene glycol Hemodynamic parameters:   Intake/Output: No intake/output data recorded.  Ventilator  Settings:    LAB RESULTS:  Basic Metabolic Panel: Recent Labs  Lab 05/14/22 1104  NA 136  K 4.0  CL 104  CO2 20*  GLUCOSE 185*  BUN 18  CREATININE 1.29*  CALCIUM 8.8*   Liver Function Tests: Recent Labs  Lab 05/14/22 1104  AST 76*  ALT 57*  ALKPHOS  86  BILITOT 2.3*  PROT 6.8  ALBUMIN 3.3*   No results for input(s): LIPASE, AMYLASE in the last 168 hours. Recent Labs  Lab 05/14/22 1104  AMMONIA 72*   CBC: Recent Labs  Lab 05/14/22 1104  WBC 4.2  NEUTROABS 3.5  HGB 11.8*  HCT 34.1*  MCV 94.7  PLT 35*   Cardiac Enzymes: No results for input(s): CKTOTAL, CKMB, CKMBINDEX, TROPONINI in the last 168 hours. BNP: Invalid input(s): POCBNP CBG: No results for input(s): GLUCAP in the last 168 hours.     IMAGING RESULTS:  Imaging: CT Head Wo Contrast  Result Date: 05/14/2022 CLINICAL DATA:  Altered mental status. EXAM: CT HEAD WITHOUT CONTRAST CT CERVICAL SPINE WITHOUT CONTRAST TECHNIQUE: Multidetector CT imaging of the head and cervical spine was performed following the standard protocol without intravenous contrast. Multiplanar CT image reconstructions of the cervical spine were also generated. RADIATION DOSE REDUCTION: This exam was performed according to the departmental dose-optimization program which includes automated exposure control, adjustment of the mA and/or kV according to patient size and/or use of iterative reconstruction technique. COMPARISON:  CT head and cervical spine dated January 22, 2019. FINDINGS: CT HEAD FINDINGS Brain: No evidence of acute infarction, hemorrhage, hydrocephalus, extra-axial collection or mass lesion/mass effect. Stable mild atrophy and chronic microvascular ischemic changes. Vascular: No hyperdense vessel or unexpected calcification. Skull: Normal. Negative for fracture or focal lesion. Sinuses/Orbits: No acute finding. Other: None. CT CERVICAL SPINE FINDINGS Alignment: No traumatic malalignment. Skull base and vertebrae: No acute fracture. No primary bone lesion or focal pathologic process. Soft tissues and spinal canal: No prevertebral fluid or swelling. No visible canal hematoma. Disc levels: Similar multilevel disc height loss and bulky endplate spurring. Advanced bilateral facet uncovertebral  hypertrophy again noted, with interval facet joint ankylosis on the right C4-C5. Upper chest: Negative. Other: None. IMPRESSION: 1. No acute intracranial abnormality. 2. No acute cervical spine fracture or traumatic listhesis. Electronically Signed   By: Titus Dubin M.D.   On: 05/14/2022 12:25   CT Cervical Spine Wo Contrast  Result Date: 05/14/2022 CLINICAL DATA:  Altered mental status. EXAM: CT HEAD WITHOUT CONTRAST CT CERVICAL SPINE WITHOUT CONTRAST TECHNIQUE: Multidetector CT imaging of the head and cervical spine was performed following the standard protocol without intravenous contrast. Multiplanar CT image reconstructions of the cervical spine were also  generated. RADIATION DOSE REDUCTION: This exam was performed according to the departmental dose-optimization program which includes automated exposure control, adjustment of the mA and/or kV according to patient size and/or use of iterative reconstruction technique. COMPARISON:  CT head and cervical spine dated January 22, 2019. FINDINGS: CT HEAD FINDINGS Brain: No evidence of acute infarction, hemorrhage, hydrocephalus, extra-axial collection or mass lesion/mass effect. Stable mild atrophy and chronic microvascular ischemic changes. Vascular: No hyperdense vessel or unexpected calcification. Skull: Normal. Negative for fracture or focal lesion. Sinuses/Orbits: No acute finding. Other: None. CT CERVICAL SPINE FINDINGS Alignment: No traumatic malalignment. Skull base and vertebrae: No acute fracture. No primary bone lesion or focal pathologic process. Soft tissues and spinal canal: No prevertebral fluid or swelling. No visible canal hematoma. Disc levels: Similar multilevel disc height loss and bulky endplate spurring. Advanced bilateral facet uncovertebral hypertrophy again noted, with interval facet joint ankylosis on the right C4-C5. Upper chest: Negative. Other: None. IMPRESSION: 1. No acute intracranial abnormality. 2. No acute cervical spine  fracture or traumatic listhesis. Electronically Signed   By: Titus Dubin M.D.   On: 05/14/2022 12:25   DG Chest Port 1 View  Result Date: 05/14/2022 CLINICAL DATA:  Questionable sepsis. EXAM: PORTABLE CHEST 1 VIEW COMPARISON:  Chest radiograph dated May 06, 2021 FINDINGS: Heart is normal in size. Prosthetic aortic valve is noted. Low lung volumes without evidence of focal consolidation or pleural effusion. Partially imaged left shoulder arthroplasty. Thoracic spondylosis. IMPRESSION: No acute cardiopulmonary disease. Electronically Signed   By: Keane Police D.O.   On: 05/14/2022 11:25   @PROBHOSP @ CT Head Wo Contrast  Result Date: 05/14/2022 CLINICAL DATA:  Altered mental status. EXAM: CT HEAD WITHOUT CONTRAST CT CERVICAL SPINE WITHOUT CONTRAST TECHNIQUE: Multidetector CT imaging of the head and cervical spine was performed following the standard protocol without intravenous contrast. Multiplanar CT image reconstructions of the cervical spine were also generated. RADIATION DOSE REDUCTION: This exam was performed according to the departmental dose-optimization program which includes automated exposure control, adjustment of the mA and/or kV according to patient size and/or use of iterative reconstruction technique. COMPARISON:  CT head and cervical spine dated January 22, 2019. FINDINGS: CT HEAD FINDINGS Brain: No evidence of acute infarction, hemorrhage, hydrocephalus, extra-axial collection or mass lesion/mass effect. Stable mild atrophy and chronic microvascular ischemic changes. Vascular: No hyperdense vessel or unexpected calcification. Skull: Normal. Negative for fracture or focal lesion. Sinuses/Orbits: No acute finding. Other: None. CT CERVICAL SPINE FINDINGS Alignment: No traumatic malalignment. Skull base and vertebrae: No acute fracture. No primary bone lesion or focal pathologic process. Soft tissues and spinal canal: No prevertebral fluid or swelling. No visible canal hematoma. Disc levels:  Similar multilevel disc height loss and bulky endplate spurring. Advanced bilateral facet uncovertebral hypertrophy again noted, with interval facet joint ankylosis on the right C4-C5. Upper chest: Negative. Other: None. IMPRESSION: 1. No acute intracranial abnormality. 2. No acute cervical spine fracture or traumatic listhesis. Electronically Signed   By: Titus Dubin M.D.   On: 05/14/2022 12:25   CT Cervical Spine Wo Contrast  Result Date: 05/14/2022 CLINICAL DATA:  Altered mental status. EXAM: CT HEAD WITHOUT CONTRAST CT CERVICAL SPINE WITHOUT CONTRAST TECHNIQUE: Multidetector CT imaging of the head and cervical spine was performed following the standard protocol without intravenous contrast. Multiplanar CT image reconstructions of the cervical spine were also generated. RADIATION DOSE REDUCTION: This exam was performed according to the departmental dose-optimization program which includes automated exposure control, adjustment of the mA and/or kV according to  patient size and/or use of iterative reconstruction technique. COMPARISON:  CT head and cervical spine dated January 22, 2019. FINDINGS: CT HEAD FINDINGS Brain: No evidence of acute infarction, hemorrhage, hydrocephalus, extra-axial collection or mass lesion/mass effect. Stable mild atrophy and chronic microvascular ischemic changes. Vascular: No hyperdense vessel or unexpected calcification. Skull: Normal. Negative for fracture or focal lesion. Sinuses/Orbits: No acute finding. Other: None. CT CERVICAL SPINE FINDINGS Alignment: No traumatic malalignment. Skull base and vertebrae: No acute fracture. No primary bone lesion or focal pathologic process. Soft tissues and spinal canal: No prevertebral fluid or swelling. No visible canal hematoma. Disc levels: Similar multilevel disc height loss and bulky endplate spurring. Advanced bilateral facet uncovertebral hypertrophy again noted, with interval facet joint ankylosis on the right C4-C5. Upper chest:  Negative. Other: None. IMPRESSION: 1. No acute intracranial abnormality. 2. No acute cervical spine fracture or traumatic listhesis. Electronically Signed   By: Titus Dubin M.D.   On: 05/14/2022 12:25   DG Chest Port 1 View  Result Date: 05/14/2022 CLINICAL DATA:  Questionable sepsis. EXAM: PORTABLE CHEST 1 VIEW COMPARISON:  Chest radiograph dated May 06, 2021 FINDINGS: Heart is normal in size. Prosthetic aortic valve is noted. Low lung volumes without evidence of focal consolidation or pleural effusion. Partially imaged left shoulder arthroplasty. Thoracic spondylosis. IMPRESSION: No acute cardiopulmonary disease. Electronically Signed   By: Keane Police D.O.   On: 05/14/2022 11:25     ASSESSMENT AND PLAN    -Multidisciplinary rounds held today  Acute Circulatory shock -Unclear etiology as of yet-suspect septic versus obstructive due to PE on empiric antibiotics. -Chest x-ray on admission without findings of pneumonia -Treating empirically with Rocephin and Zithromax -In lieu of thrombocytopenia and tachycardia with circulatory shock will obtain CT chest to rule out PE Blood cultures x2 ordered -MRSA PCR -Procalcitonin trend  Portal hypertensive gastropathy -Protonix IV 40 mg daily -Empirically on Rocephin for possible SBP -Clinically does not have distended abdomen or fluid shift and abdominal exam   Renal Failure-acute stage II -Likely septic nephropathy -follow chem 7 -follow UO -continue Foley Catheter-assess need daily   NEUROLOGY - altered mental status with lethargy -Suspect septic encephalopathy Wake up assessment pending   Liver cirrhosis with coagulopathy -Complicated by thrombocytopenia -No DVT prophylaxis at this time -SCDs only   ID -continue IV abx as prescibed -follow up cultures  GI/Nutrition GI PROPHYLAXIS as indicated DIET-->TF's as tolerated Constipation protocol as indicated  ENDO - ICU hypoglycemic\Hyperglycemia protocol -check FSBS  per protocol   ELECTROLYTES -follow labs as needed -replace as needed -pharmacy consultation   DVT/GI PRX ordered -SCDs  TRANSFUSIONS AS NEEDED MONITOR FSBS ASSESS the need for LABS as needed  Critical care provider statement:   Total critical care time: 33 minutes   Performed by: Lanney Gins MD   Critical care time was exclusive of separately billable procedures and treating other patients.   Critical care was necessary to treat or prevent imminent or life-threatening deterioration.   Critical care was time spent personally by me on the following activities: development of treatment plan with patient and/or surrogate as well as nursing, discussions with consultants, evaluation of patient's response to treatment, examination of patient, obtaining history from patient or surrogate, ordering and performing treatments and interventions, ordering and review of laboratory studies, ordering and review of radiographic studies, pulse oximetry and re-evaluation of patient's condition.    Ottie Glazier, M.D.  Pulmonary & Critical Care Medicine      This document was prepared using Dragon voice recognition  software and may include unintentional dictation errors.    Ottie Glazier, M.D.  Division of [place]

## 2022-05-14 NOTE — Sepsis Progress Note (Signed)
Notified provider of need to order antibiotics and fluid bolus for persistent hypotension.

## 2022-05-14 NOTE — ED Notes (Signed)
Received report from [facility].

## 2022-05-14 NOTE — Consult Note (Signed)
PHARMACY -  BRIEF ANTIBIOTIC NOTE   Pharmacy has received consult(s) for vancomycin and cefepime from an ED provider.  The patient's profile has been reviewed for ht/wt/allergies/indication/available labs.    One time order(s) placed for cefepime 2 g and vancomycin 2250 mg IV (1000 mg followed by 1250 mg)   Further antibiotics/pharmacy consults should be ordered by admitting physician if indicated.                       Thank you, Darnelle Bos, PharmD 05/14/2022  12:12 PM

## 2022-05-14 NOTE — Sepsis Progress Note (Signed)
Notified bedside nurse of need to administer antibiotics.  

## 2022-05-14 NOTE — Consult Note (Signed)
CODE SEPSIS - PHARMACY COMMUNICATION  **[facility] Spectrum Antibiotics should be administered within 1 hour of Sepsis diagnosis**  Time Code Sepsis Called/Page Received: 1105  Antibiotics Ordered: 1202  Time of 1st antibiotic administration: 1230  Additional action taken by pharmacy: n/a  If necessary, Name of Provider/Nurse Contacted: [place] ,PharmD Clinical Pharmacist  05/14/2022  11:07 AM

## 2022-05-15 ENCOUNTER — Encounter: Payer: Self-pay | Admitting: Pulmonary Disease

## 2022-05-15 ENCOUNTER — Ambulatory Visit: Payer: No Typology Code available for payment source

## 2022-05-15 DIAGNOSIS — B957 Other staphylococcus as the cause of diseases classified elsewhere: Secondary | ICD-10-CM | POA: Diagnosis not present

## 2022-05-15 DIAGNOSIS — K7469 Other cirrhosis of liver: Secondary | ICD-10-CM

## 2022-05-15 DIAGNOSIS — A411 Sepsis due to other specified staphylococcus: Secondary | ICD-10-CM | POA: Diagnosis not present

## 2022-05-15 DIAGNOSIS — Z952 Presence of prosthetic heart valve: Secondary | ICD-10-CM | POA: Diagnosis not present

## 2022-05-15 LAB — BLOOD CULTURE ID PANEL (REFLEXED) - BCID2

## 2022-05-15 LAB — COMPREHENSIVE METABOLIC PANEL
ALT: 55 U/L — ABNORMAL HIGH (ref 0–44)
AST: 92 U/L — ABNORMAL HIGH (ref 15–41)
Albumin: 2.7 g/dL — ABNORMAL LOW (ref 3.5–5.0)
Alkaline Phosphatase: 72 U/L (ref 38–126)
Anion gap: 5 (ref 5–15)
BUN: 17 mg/dL (ref 8–23)
CO2: 23 mmol/L (ref 22–32)
Calcium: 8 mg/dL — ABNORMAL LOW (ref 8.9–10.3)
Chloride: 108 mmol/L (ref 98–111)
Creatinine, Ser: 1.26 mg/dL — ABNORMAL HIGH (ref 0.61–1.24)
GFR, Estimated: 59 mL/min — ABNORMAL LOW (ref >60)
Glucose, Bld: 193 mg/dL — ABNORMAL HIGH (ref 70–99)
Potassium: 4 mmol/L (ref 3.5–5.1)
Sodium: 136 mmol/L (ref 135–145)
Total Bilirubin: 1.9 mg/dL — ABNORMAL HIGH (ref 0.3–1.2)
Total Protein: 5.6 g/dL — ABNORMAL LOW (ref 6.5–8.1)

## 2022-05-15 LAB — PHOSPHORUS: Phosphorus: 3.3 mg/dL (ref 2.5–4.6)

## 2022-05-15 LAB — CBC
HCT: 29 % — ABNORMAL LOW (ref 39.0–52.0)
Hemoglobin: 10.1 g/dL — ABNORMAL LOW (ref 13.0–17.0)
MCH: 32.3 pg (ref 26.0–34.0)
MCHC: 34.8 g/dL (ref 30.0–36.0)
MCV: 92.7 fL (ref 80.0–100.0)
Platelets: 26 10*3/uL — CL (ref 150–400)
RBC: 3.13 MIL/uL — ABNORMAL LOW (ref 4.22–5.81)
RDW: 14.9 % (ref 11.5–15.5)
WBC: 3.8 10*3/uL — ABNORMAL LOW (ref 4.0–10.5)
nRBC: 0 % (ref 0.0–0.2)

## 2022-05-15 LAB — GLUCOSE, CAPILLARY
Glucose-Capillary: 182 mg/dL — ABNORMAL HIGH (ref 70–99)
Glucose-Capillary: 239 mg/dL — ABNORMAL HIGH (ref 70–99)
Glucose-Capillary: 244 mg/dL — ABNORMAL HIGH (ref 70–99)
Glucose-Capillary: 143 mg/dL — ABNORMAL HIGH (ref 70–99)

## 2022-05-15 LAB — LEGIONELLA PNEUMOPHILA SEROGP 1 UR AG: L. pneumophila Serogp 1 Ur Ag: NEGATIVE

## 2022-05-15 LAB — MAGNESIUM: Magnesium: 1.5 mg/dL — ABNORMAL LOW (ref 1.7–2.4)

## 2022-05-15 LAB — PROCALCITONIN: Procalcitonin: 0.42 ng/mL

## 2022-05-15 LAB — LACTIC ACID, PLASMA: Lactic Acid, Venous: 2.1 mmol/L (ref 0.5–1.9)

## 2022-05-15 MED ORDER — INSULIN ASPART 100 UNIT/ML IJ SOLN: 0.0000 [IU] | Freq: Every day | INTRAMUSCULAR | Status: DC

## 2022-05-15 MED ORDER — INSULIN ASPART 100 UNIT/ML IJ SOLN
0.0000 [IU] | Freq: Three times a day (TID) | INTRAMUSCULAR | Status: DC
  Administered 2022-05-15 (×2): 3 [IU] via SUBCUTANEOUS
  Administered 2022-05-16: 5 [IU] via SUBCUTANEOUS
  Administered 2022-05-16: 2 [IU] via SUBCUTANEOUS
  Filled 2022-05-15 (×4): qty 1

## 2022-05-15 MED ORDER — VANCOMYCIN HCL 2000 MG/400ML IV SOLN
2000.0000 mg | INTRAVENOUS | Status: DC
  Administered 2022-05-15 – 2022-05-17 (×3): 2000 mg via INTRAVENOUS
  Filled 2022-05-15 (×3): qty 400

## 2022-05-15 MED ORDER — LEVOTHYROXINE SODIUM 100 MCG PO TABS
100.0000 ug | ORAL_TABLET | Freq: Every day | ORAL | Status: DC
  Administered 2022-05-16 – 2022-05-28 (×12): 100 ug via ORAL
  Filled 2022-05-15 (×13): qty 1

## 2022-05-15 MED ORDER — LACTULOSE 10 GM/15ML PO SOLN
30.0000 g | Freq: Every day | ORAL | Status: DC
  Administered 2022-05-15 – 2022-05-17 (×3): 30 g via ORAL
  Filled 2022-05-15 (×3): qty 60

## 2022-05-15 MED ORDER — MAGNESIUM SULFATE 2 GM/50ML IV SOLN
2.0000 g | Freq: Once | INTRAVENOUS | Status: AC
  Administered 2022-05-15: 2 g via INTRAVENOUS
  Filled 2022-05-15: qty 50

## 2022-05-15 MED ORDER — RIFAXIMIN 550 MG PO TABS
550.0000 mg | ORAL_TABLET | Freq: Two times a day (BID) | ORAL | Status: DC
  Administered 2022-05-15 – 2022-05-28 (×27): 550 mg via ORAL
  Filled 2022-05-15 (×27): qty 1

## 2022-05-15 NOTE — Progress Notes (Signed)
CRITICAL CARE     Name: Joshua Berry MRN: [id number] DOB: 1946/08/16     LOS: 1   SUBJECTIVE FINDINGS & SIGNIFICANT EVENTS    Patient description:  This is a 76 year old male with history of diabetes, liver cirrhosis, diastolic CHF essential hypertension dyslipidemia came in with malaise and altered mental status.  Vital signs showed shock physiology status post 3.5 L resuscitative IV fluids with minimal improvement requiring Levophed vasopressor support.  Also low-grade fever over 100.1 noted on admission.  CBC with severe thrombocytopenia at 35, BMP showing AKI and mild lactic acidosis.  Urinalysis is reassuring without leukocyte esterase normal pH and no ketones.  During my evaluation patient is lethargic and noncommunicative requiring low-dose Levophed peripherally.   05/15/22- patient is improved and is off levophed. He is mentating better.  We are planning to do swallow evaluation.  ID consult for endocarditis workup due to 4/4 staph +cultures.    Lines/tubes : External Urinary Catheter (Active)  Collection Container Dedicated Suction Canister 05/14/22 1343  Suction (Verified suction is between 40-80 mmHg) Yes 05/14/22 1343  Securement Method Mesh underwear 05/14/22 1343  Site Assessment Clean, Dry, Intact 05/14/22 1343    Microbiology/Sepsis markers: Results for orders placed or performed during the hospital encounter of 05/14/22  Blood Culture (routine x 2)     Status: None (Preliminary result)   Collection Time: 05/14/22 11:06 AM   Specimen: BLOOD  Result Value Ref Range Status   Specimen Description   Final    BLOOD BLOOD RIGHT FOREARM Performed at [facility], [address] [place], [place] [address]    Special Requests   Final    BOTTLES DRAWN AEROBIC AND ANAEROBIC Blood Culture  adequate volume Performed at [facility], [address] [place], [place] [address]    Culture  Setup Time   Final    Organism ID to follow [place] AND ANAEROBIC BOTTLES CRITICAL RESULT CALLED TO, READ BACK BY AND VERIFIED WITH: NATHAN BELUTH @ 0244 ON 05/15/2022.Marland KitchenMarland KitchenTKR    Culture GRAM POSITIVE COCCI  Final   Report Status PENDING  Incomplete  Blood Culture ID Panel (Reflexed)     Status: Abnormal   Collection Time: 05/14/22 11:06 AM  Result Value Ref Range Status   Enterococcus faecalis NOT DETECTED NOT DETECTED Final   Enterococcus Faecium NOT DETECTED NOT DETECTED Final   Listeria monocytogenes NOT DETECTED NOT DETECTED Final   Staphylococcus species DETECTED (A) NOT DETECTED Final    Comment: NATHAN BELUTH @ 9326 ON 05/15/2022.Marland KitchenMarland KitchenTKR   Staphylococcus aureus (BCID) NOT DETECTED NOT DETECTED Final   Staphylococcus epidermidis NOT DETECTED NOT DETECTED Final   Staphylococcus lugdunensis NOT DETECTED NOT DETECTED Final   Streptococcus species NOT DETECTED NOT DETECTED Final   Streptococcus agalactiae NOT DETECTED NOT DETECTED Final   Streptococcus pneumoniae NOT DETECTED NOT DETECTED Final   Streptococcus pyogenes NOT DETECTED NOT DETECTED Final   A.calcoaceticus-baumannii NOT DETECTED NOT DETECTED Final   Bacteroides fragilis NOT DETECTED NOT DETECTED Final   Enterobacterales NOT DETECTED NOT DETECTED Final   Enterobacter cloacae complex NOT DETECTED NOT DETECTED Final   Escherichia coli NOT DETECTED NOT DETECTED Final   Klebsiella aerogenes NOT DETECTED NOT DETECTED Final   Klebsiella oxytoca NOT DETECTED NOT DETECTED Final   Klebsiella pneumoniae NOT DETECTED NOT DETECTED Final   Proteus species NOT DETECTED NOT DETECTED Final   Salmonella species NOT DETECTED NOT DETECTED Final   Serratia marcescens NOT DETECTED NOT DETECTED Final  Haemophilus influenzae NOT DETECTED NOT DETECTED Final   Neisseria meningitidis NOT DETECTED NOT DETECTED  Final   Pseudomonas aeruginosa NOT DETECTED NOT DETECTED Final   Stenotrophomonas maltophilia NOT DETECTED NOT DETECTED Final   Candida albicans NOT DETECTED NOT DETECTED Final   Candida auris NOT DETECTED NOT DETECTED Final   Candida glabrata NOT DETECTED NOT DETECTED Final   Candida krusei NOT DETECTED NOT DETECTED Final   Candida parapsilosis NOT DETECTED NOT DETECTED Final   Candida tropicalis NOT DETECTED NOT DETECTED Final   Cryptococcus neoformans/gattii NOT DETECTED NOT DETECTED Final    Comment: Performed at [facility], [address] [place], [place] [address]  Blood Culture (routine x 2)     Status: None (Preliminary result)   Collection Time: 05/14/22 11:22 AM   Specimen: BLOOD  Result Value Ref Range Status   Specimen Description   Final    BLOOD RIGHT ARM Performed at [facility], [address], [place], [place] [address]    Special Requests   Final    BOTTLES DRAWN AEROBIC AND ANAEROBIC Blood Culture results may not be optimal due to an excessive volume of blood received in culture bottles Performed at [facility], [address] [place], [place] [address]    Culture  Setup Time   Final    GRAM POSITIVE COCCI IN BOTH AEROBIC AND ANAEROBIC BOTTLES CRITICAL VALUE NOTED.  VALUE IS CONSISTENT WITH PREVIOUSLY REPORTED AND CALLED VALUE.    Culture GRAM POSITIVE COCCI  Final   Report Status PENDING  Incomplete  Resp Panel by RT-PCR (Flu A&B, Covid) Anterior Nasal Swab     Status: None   Collection Time: 05/14/22 12:04 PM   Specimen: Anterior Nasal Swab  Result Value Ref Range Status   SARS Coronavirus 2 by RT PCR NEGATIVE NEGATIVE Final    Comment: (NOTE) SARS-CoV-2 target nucleic acids are NOT DETECTED.  The SARS-CoV-2 RNA is generally detectable in upper respiratory specimens during the acute phase of infection. The lowest concentration of SARS-CoV-2 viral copies this assay can detect is 138 copies/mL. A negative result does not  preclude SARS-Cov-2 infection and should not be used as the sole basis for treatment or other patient management decisions. A negative result may occur with  improper specimen collection/handling, submission of specimen other than nasopharyngeal swab, presence of viral mutation(s) within the areas targeted by this assay, and inadequate number of viral copies(<138 copies/mL). A negative result must be combined with clinical observations, patient history, and epidemiological information. The expected result is Negative.  Fact Sheet for Patients:  EntrepreneurPulse.com.au  Fact Sheet for Healthcare Providers:  IncredibleEmployment.be  This test is no t yet approved or cleared by the [place] FDA and  has been authorized for detection and/or diagnosis of SARS-CoV-2 by FDA under an Emergency Use Authorization (EUA). This EUA will remain  in effect (meaning this test can be used) for the duration of the COVID-19 declaration under Section 564(b)(1) of the Act, 21 U.S.C.section 360bbb-3(b)(1), unless the authorization is terminated  or revoked sooner.       Influenza A by PCR NEGATIVE NEGATIVE Final   Influenza B by PCR NEGATIVE NEGATIVE Final    Comment: (NOTE) The Xpert Xpress SARS-CoV-2/FLU/RSV plus assay is intended as an aid in the diagnosis of influenza from Nasopharyngeal swab specimens and should not be used as a sole basis for treatment. Nasal washings and aspirates are unacceptable for Xpert Xpress SARS-CoV-2/FLU/RSV testing.  Fact Sheet for Patients: EntrepreneurPulse.com.au  Fact  Sheet for Healthcare Providers: IncredibleEmployment.be  This test is not yet approved or cleared by the [place] and has been authorized for detection and/or diagnosis of SARS-CoV-2 by FDA under an Emergency Use Authorization (EUA). This EUA will remain in effect (meaning this test can be used) for the  duration of the COVID-19 declaration under Section 564(b)(1) of the Act, 21 U.S.C. section 360bbb-3(b)(1), unless the authorization is terminated or revoked.  Performed at [facility], [address] [place], [place] [address]   MRSA Next Gen by PCR, Nasal     Status: None   Collection Time: 05/14/22  9:29 PM   Specimen: Nasal Mucosa; Nasal Swab  Result Value Ref Range Status   MRSA by PCR Next Gen NOT DETECTED NOT DETECTED Final    Comment: (NOTE) The GeneXpert MRSA Assay (FDA approved for NASAL specimens only), is one component of a comprehensive MRSA colonization surveillance program. It is not intended to diagnose MRSA infection nor to guide or monitor treatment for MRSA infections. Test performance is not FDA approved in patients less than 17 years old. Performed at [facility], [address], [place], [place] [address]     Anti-infectives:  Anti-infectives (From admission, onward)    Start     Dose/Rate Route Frequency Ordered Stop   05/15/22 1000  vancomycin (VANCOREADY) IVPB 2000 mg/400 mL        2,000 mg 200 mL/hr over 120 Minutes Intravenous Every 24 hours 05/15/22 0306     05/14/22 2000  cefTRIAXone (ROCEPHIN) 2 g in sodium chloride 0.9 % 100 mL IVPB  Status:  Discontinued        2 g 200 mL/hr over 30 Minutes Intravenous Every 24 hours 05/14/22 1617 05/15/22 0257   05/14/22 1800  azithromycin (ZITHROMAX) 500 mg in sodium chloride 0.9 % 250 mL IVPB        500 mg 250 mL/hr over 60 Minutes Intravenous Every 24 hours 05/14/22 1617     05/14/22 1330  vancomycin (VANCOREADY) IVPB 1250 mg/250 mL        1,250 mg 166.7 mL/hr over 90 Minutes Intravenous  Once 05/14/22 1220 05/14/22 1435   05/14/22 1215  ceFEPIme (MAXIPIME) 2 g in sodium chloride 0.9 % 100 mL IVPB        2 g 200 mL/hr over 30 Minutes Intravenous  Once 05/14/22 1202 05/14/22 1300   05/14/22 1215  vancomycin (VANCOCIN) IVPB 1000 mg/200 mL premix        1,000 mg 200 mL/hr over 60  Minutes Intravenous  Once 05/14/22 1202 05/14/22 1331       PAST MEDICAL HISTORY   Past Medical History:  Diagnosis Date   Arthritis    Bleeding ulcer    Cancer ([place])    melanoma, carcinoma   Cirrhosis of liver ([place])    Diabetes mellitus without complication ([place])    Heart murmur    Hypertension    Iron deficiency anemia    NASH (nonalcoholic steatohepatitis)    Sleep apnea    Spleen enlarged    Thrombocytopenia ([place])      SURGICAL HISTORY   Past Surgical History:  Procedure Laterality Date   CHOLECYSTECTOMY     COLONOSCOPY N/A 07/23/2019   Procedure: COLONOSCOPY;  Surgeon: Lin Landsman, MD;  Location: [facility] ENDOSCOPY;  Service: Gastroenterology;  Laterality: N/A;   ESOPHAGOGASTRODUODENOSCOPY N/A 07/20/2019   Procedure: ESOPHAGOGASTRODUODENOSCOPY (EGD);  Surgeon: Lin Landsman, MD;  Location: [facility] ENDOSCOPY;  Service: Gastroenterology;  Laterality: N/A;  ESOPHAGOGASTRODUODENOSCOPY Left 08/20/2019   Procedure: ESOPHAGOGASTRODUODENOSCOPY (EGD);  Surgeon: Virgel Manifold, MD;  Location: [facility] ENDOSCOPY;  Service: Endoscopy;  Laterality: Left;   ESOPHAGOGASTRODUODENOSCOPY (EGD) WITH PROPOFOL N/A 04/25/2021   Procedure: ESOPHAGOGASTRODUODENOSCOPY (EGD) WITH PROPOFOL;  Surgeon: Lin Landsman, MD;  Location: [facility] ENDOSCOPY;  Service: Gastroenterology;  Laterality: N/A;   SKIN BIOPSY     TEE WITHOUT CARDIOVERSION N/A 12/23/2018   Procedure: TRANSESOPHAGEAL ECHOCARDIOGRAM (TEE);  Surgeon: Teodoro Spray, MD;  Location: [facility] ORS;  Service: Cardiovascular;  Laterality: N/A;     FAMILY HISTORY   Family History  Problem Relation Age of Onset   Heart attack Brother      SOCIAL HISTORY   Social History   Tobacco Use   Smoking status: Former    Packs/day: 0.50    Years: 5.00    Pack years: 2.50    Types: Cigarettes    Quit date: 10/16/1968    Years since quitting: 53.6   Smokeless tobacco: Never  Vaping Use   Vaping Use: Never used  Substance Use  Topics   Alcohol use: Not Currently   Drug use: Not Currently     MEDICATIONS   Current Medication:  Current Facility-Administered Medications:    azithromycin (ZITHROMAX) 500 mg in sodium chloride 0.9 % 250 mL IVPB, 500 mg, Intravenous, Q24H, Lenord Fralix, MD, Stopped at 05/14/22 2028   Chlorhexidine Gluconate Cloth 2 % PADS 6 each, 6 each, Topical, Q0600, Ottie Glazier, MD, 6 each at 05/15/22 0600   magnesium sulfate IVPB 2 g 50 mL, 2 g, Intravenous, Once, Dallie Piles, RPH   norepinephrine (LEVOPHED) 15m in 2528m(0.016 mg/mL) premix infusion, 0-40 mcg/min, Intravenous, Continuous, Siadecki, SeFelix AhmadiMD, Stopped at 05/14/22 1500   pantoprazole (PROTONIX) injection 40 mg, 40 mg, Intravenous, QHS, Robyne Matar, MD, 40 mg at 05/14/22 2300   polyethylene glycol (MIRALAX / GLYCOLAX) packet 17 g, 17 g, Oral, Daily PRN, AlOttie GlazierMD   vancomycin (VANCOREADY) IVPB 2000 mg/400 mL, 2,000 mg, Intravenous, Q24H, Belue, Nathan S, RPH    ALLERGIES   Lisinopril, Sulfamethoxazole, Camphor, Latex, and Nickel    REVIEW OF SYSTEMS     Unable to obtain due to altered mental status with lethargy  PHYSICAL EXAMINATION   Vital Signs: Temp:  [99.8 F (37.7 C)-100.6 F (38.1 C)] 100.5 F (38.1 C) (05/31 0400) Pulse Rate:  [47-91] 55 (05/31 0600) Resp:  [12-23] 17 (05/31 0600) BP: (75-130)/(41-97) 103/51 (05/31 0600) SpO2:  [92 %-100 %] 92 % (05/31 0600) Weight:  [104.3 kg-104.9 kg] 104.9 kg (05/31 0444)  GENERAL: Age-appropriate critically ill HEAD: Normocephalic, atraumatic.  EYES: Pupils equal, round, reactive to light.  No scleral icterus.  MOUTH: Moist mucosal membrane. NECK: Supple. No thyromegaly. No nodules. No JVD.  PULMONARY: Crackles at the bases bilaterally CARDIOVASCULAR: S1 and S2. Regular rate and rhythm. No murmurs, rubs, or gallops.  GASTROINTESTINAL: Soft, nontender, non-distended. No masses. Positive bowel sounds. No hepatosplenomegaly.   MUSCULOSKELETAL: No swelling, clubbing, or edema.  NEUROLOGIC: GCS 10 with lethargy SKIN:intact,warm,dry   PERTINENT DATA     Infusions:  azithromycin Stopped (05/14/22 2028)   magnesium sulfate bolus IVPB     norepinephrine (LEVOPHED) Adult infusion Stopped (05/14/22 1500)   vancomycin     Scheduled Medications:  Chlorhexidine Gluconate Cloth  6 each Topical Q0600   pantoprazole (PROTONIX) IV  40 mg Intravenous QHS   PRN Medications: polyethylene glycol Hemodynamic parameters:   Intake/Output: 05/30 0701 - 05/31 0700 In: 4392.2 [I.V.:7.8; IV Piggyback:4384.3] Out:  300 [Urine:300]  Ventilator  Settings:    LAB RESULTS:  Basic Metabolic Panel: Recent Labs  Lab 05/14/22 1104 05/14/22 1609 05/15/22 0323  NA 136  --  136  K 4.0  --  4.0  CL 104  --  108  CO2 20*  --  23  GLUCOSE 185*  --  193*  BUN 18  --  17  CREATININE 1.29*  --  1.26*  CALCIUM 8.8*  --  8.0*  MG  --  1.7 1.5*  PHOS  --  2.8 3.3    Liver Function Tests: Recent Labs  Lab 05/14/22 1104 05/15/22 0323  AST 76* 92*  ALT 57* 55*  ALKPHOS 86 72  BILITOT 2.3* 1.9*  PROT 6.8 5.6*  ALBUMIN 3.3* 2.7*    No results for input(s): LIPASE, AMYLASE in the last 168 hours. Recent Labs  Lab 05/14/22 1104  AMMONIA 72*    CBC: Recent Labs  Lab 05/14/22 1104 05/15/22 0323  WBC 4.2 3.8*  NEUTROABS 3.5  --   HGB 11.8* 10.1*  HCT 34.1* 29.0*  MCV 94.7 92.7  PLT 35* 26*    Cardiac Enzymes: No results for input(s): CKTOTAL, CKMB, CKMBINDEX, TROPONINI in the last 168 hours. BNP: Invalid input(s): POCBNP CBG: Recent Labs  Lab 05/14/22 2124 05/15/22 0339  GLUCAP 193* 182*       IMAGING RESULTS:  Imaging: CT Head Wo Contrast  Result Date: 05/14/2022 CLINICAL DATA:  Altered mental status. EXAM: CT HEAD WITHOUT CONTRAST CT CERVICAL SPINE WITHOUT CONTRAST TECHNIQUE: Multidetector CT imaging of the head and cervical spine was performed following the standard protocol without  intravenous contrast. Multiplanar CT image reconstructions of the cervical spine were also generated. RADIATION DOSE REDUCTION: This exam was performed according to the departmental dose-optimization program which includes automated exposure control, adjustment of the mA and/or kV according to patient size and/or use of iterative reconstruction technique. COMPARISON:  CT head and cervical spine dated January 22, 2019. FINDINGS: CT HEAD FINDINGS Brain: No evidence of acute infarction, hemorrhage, hydrocephalus, extra-axial collection or mass lesion/mass effect. Stable mild atrophy and chronic microvascular ischemic changes. Vascular: No hyperdense vessel or unexpected calcification. Skull: Normal. Negative for fracture or focal lesion. Sinuses/Orbits: No acute finding. Other: None. CT CERVICAL SPINE FINDINGS Alignment: No traumatic malalignment. Skull base and vertebrae: No acute fracture. No primary bone lesion or focal pathologic process. Soft tissues and spinal canal: No prevertebral fluid or swelling. No visible canal hematoma. Disc levels: Similar multilevel disc height loss and bulky endplate spurring. Advanced bilateral facet uncovertebral hypertrophy again noted, with interval facet joint ankylosis on the right C4-C5. Upper chest: Negative. Other: None. IMPRESSION: 1. No acute intracranial abnormality. 2. No acute cervical spine fracture or traumatic listhesis. Electronically Signed   By: Titus Dubin M.D.   On: 05/14/2022 12:25   CT Angio Chest Pulmonary Embolism (PE) W or WO Contrast  Result Date: 05/14/2022 CLINICAL DATA:  Pulmonary embolus suspected. EXAM: CT ANGIOGRAPHY CHEST WITH CONTRAST TECHNIQUE: Multidetector CT imaging of the chest was performed using the standard protocol during bolus administration of intravenous contrast. Multiplanar CT image reconstructions and MIPs were obtained to evaluate the vascular anatomy. RADIATION DOSE REDUCTION: This exam was performed according to the  departmental dose-optimization program which includes automated exposure control, adjustment of the mA and/or kV according to patient size and/or use of iterative reconstruction technique. CONTRAST:  23m OMNIPAQUE IOHEXOL 350 MG/ML SOLN COMPARISON:  CT chest angio dated April 06, 2022 FINDINGS: Cardiovascular: Somewhat suboptimal  contrast opacification of the pulmonary arteries. No evidence of pulmonary embolus. Limited evaluation of the distal lobar, segmental and subsegmental pulmonary arteries due to poor contrast opacification and severe streak artifact. Normal heart size. No pericardial effusion. Prior aortic valve replacement. Mild atherosclerotic disease of the thoracic aorta. Moderate coronary artery calcifications of the LAD. Mitral annular calcifications. Mediastinum/Nodes: Esophagus and thyroid are unremarkable. Pathologically enlarged lymph nodes seen in the chest. Lungs/Pleura: Central airways are patent. Bibasilar atelectasis. No consolidation, pleural effusion or pneumothorax. Upper Abdomen: Cirrhotic liver morphology with tips in place. Splenomegaly. Diverticulosis. Musculoskeletal: No chest wall abnormality. No acute or significant osseous findings. Review of the MIP images confirms the above findings. IMPRESSION: 1. No evidence of central pulmonary embolus. Evaluation of the more distal pulmonary arteries is limited due to poor contrast opacification and severe streak artifact. 2. No acute airspace opacity. 3. Moderate coronary artery calcifications. 4. 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 5. Aortic Atherosclerosis (ICD10-I70.0). Electronically Signed   By: Yetta Glassman M.D.   On: 05/14/2022 16:49   CT Cervical Spine Wo Contrast  Result Date: 05/14/2022 CLINICAL DATA:  Altered mental status. EXAM: CT HEAD WITHOUT CONTRAST CT CERVICAL SPINE WITHOUT CONTRAST TECHNIQUE: Multidetector CT imaging of the head and cervical spine was performed  following the standard protocol without intravenous contrast. Multiplanar CT image reconstructions of the cervical spine were also generated. RADIATION DOSE REDUCTION: This exam was performed according to the departmental dose-optimization program which includes automated exposure control, adjustment of the mA and/or kV according to patient size and/or use of iterative reconstruction technique. COMPARISON:  CT head and cervical spine dated January 22, 2019. FINDINGS: CT HEAD FINDINGS Brain: No evidence of acute infarction, hemorrhage, hydrocephalus, extra-axial collection or mass lesion/mass effect. Stable mild atrophy and chronic microvascular ischemic changes. Vascular: No hyperdense vessel or unexpected calcification. Skull: Normal. Negative for fracture or focal lesion. Sinuses/Orbits: No acute finding. Other: None. CT CERVICAL SPINE FINDINGS Alignment: No traumatic malalignment. Skull base and vertebrae: No acute fracture. No primary bone lesion or focal pathologic process. Soft tissues and spinal canal: No prevertebral fluid or swelling. No visible canal hematoma. Disc levels: Similar multilevel disc height loss and bulky endplate spurring. Advanced bilateral facet uncovertebral hypertrophy again noted, with interval facet joint ankylosis on the right C4-C5. Upper chest: Negative. Other: None. IMPRESSION: 1. No acute intracranial abnormality. 2. No acute cervical spine fracture or traumatic listhesis. Electronically Signed   By: Titus Dubin M.D.   On: 05/14/2022 12:25   DG Chest Port 1 View  Result Date: 05/14/2022 CLINICAL DATA:  Questionable sepsis. EXAM: PORTABLE CHEST 1 VIEW COMPARISON:  Chest radiograph dated May 06, 2021 FINDINGS: Heart is normal in size. Prosthetic aortic valve is noted. Low lung volumes without evidence of focal consolidation or pleural effusion. Partially imaged left shoulder arthroplasty. Thoracic spondylosis. IMPRESSION: No acute cardiopulmonary disease. Electronically  Signed   By: Keane Police D.O.   On: 05/14/2022 11:25   @PROBHOSP @ CT Head Wo Contrast  Result Date: 05/14/2022 CLINICAL DATA:  Altered mental status. EXAM: CT HEAD WITHOUT CONTRAST CT CERVICAL SPINE WITHOUT CONTRAST TECHNIQUE: Multidetector CT imaging of the head and cervical spine was performed following the standard protocol without intravenous contrast. Multiplanar CT image reconstructions of the cervical spine were also generated. RADIATION DOSE REDUCTION: This exam was performed according to the departmental dose-optimization program which includes automated exposure control, adjustment of the mA and/or kV according to patient size and/or use of iterative reconstruction technique. COMPARISON:  CT head and cervical spine dated January 22, 2019. FINDINGS: CT HEAD FINDINGS Brain: No evidence of acute infarction, hemorrhage, hydrocephalus, extra-axial collection or mass lesion/mass effect. Stable mild atrophy and chronic microvascular ischemic changes. Vascular: No hyperdense vessel or unexpected calcification. Skull: Normal. Negative for fracture or focal lesion. Sinuses/Orbits: No acute finding. Other: None. CT CERVICAL SPINE FINDINGS Alignment: No traumatic malalignment. Skull base and vertebrae: No acute fracture. No primary bone lesion or focal pathologic process. Soft tissues and spinal canal: No prevertebral fluid or swelling. No visible canal hematoma. Disc levels: Similar multilevel disc height loss and bulky endplate spurring. Advanced bilateral facet uncovertebral hypertrophy again noted, with interval facet joint ankylosis on the right C4-C5. Upper chest: Negative. Other: None. IMPRESSION: 1. No acute intracranial abnormality. 2. No acute cervical spine fracture or traumatic listhesis. Electronically Signed   By: Titus Dubin M.D.   On: 05/14/2022 12:25   CT Angio Chest Pulmonary Embolism (PE) W or WO Contrast  Result Date: 05/14/2022 CLINICAL DATA:  Pulmonary embolus suspected. EXAM: CT  ANGIOGRAPHY CHEST WITH CONTRAST TECHNIQUE: Multidetector CT imaging of the chest was performed using the standard protocol during bolus administration of intravenous contrast. Multiplanar CT image reconstructions and MIPs were obtained to evaluate the vascular anatomy. RADIATION DOSE REDUCTION: This exam was performed according to the departmental dose-optimization program which includes automated exposure control, adjustment of the mA and/or kV according to patient size and/or use of iterative reconstruction technique. CONTRAST:  61m OMNIPAQUE IOHEXOL 350 MG/ML SOLN COMPARISON:  CT chest angio dated April 06, 2022 FINDINGS: Cardiovascular: Somewhat suboptimal contrast opacification of the pulmonary arteries. No evidence of pulmonary embolus. Limited evaluation of the distal lobar, segmental and subsegmental pulmonary arteries due to poor contrast opacification and severe streak artifact. Normal heart size. No pericardial effusion. Prior aortic valve replacement. Mild atherosclerotic disease of the thoracic aorta. Moderate coronary artery calcifications of the LAD. Mitral annular calcifications. Mediastinum/Nodes: Esophagus and thyroid are unremarkable. Pathologically enlarged lymph nodes seen in the chest. Lungs/Pleura: Central airways are patent. Bibasilar atelectasis. No consolidation, pleural effusion or pneumothorax. Upper Abdomen: Cirrhotic liver morphology with tips in place. Splenomegaly. Diverticulosis. Musculoskeletal: No chest wall abnormality. No acute or significant osseous findings. Review of the MIP images confirms the above findings. IMPRESSION: 1. No evidence of central pulmonary embolus. Evaluation of the more distal pulmonary arteries is limited due to poor contrast opacification and severe streak artifact. 2. No acute airspace opacity. 3. Moderate coronary artery calcifications. 4. 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 5. Aortic Atherosclerosis  (ICD10-I70.0). Electronically Signed   By: LYetta GlassmanM.D.   On: 05/14/2022 16:49   CT Cervical Spine Wo Contrast  Result Date: 05/14/2022 CLINICAL DATA:  Altered mental status. EXAM: CT HEAD WITHOUT CONTRAST CT CERVICAL SPINE WITHOUT CONTRAST TECHNIQUE: Multidetector CT imaging of the head and cervical spine was performed following the standard protocol without intravenous contrast. Multiplanar CT image reconstructions of the cervical spine were also generated. RADIATION DOSE REDUCTION: This exam was performed according to the departmental dose-optimization program which includes automated exposure control, adjustment of the mA and/or kV according to patient size and/or use of iterative reconstruction technique. COMPARISON:  CT head and cervical spine dated January 22, 2019. FINDINGS: CT HEAD FINDINGS Brain: No evidence of acute infarction, hemorrhage, hydrocephalus, extra-axial collection or mass lesion/mass effect. Stable mild atrophy and chronic microvascular ischemic changes. Vascular: No hyperdense vessel or unexpected calcification. Skull: Normal. Negative for fracture or focal lesion. Sinuses/Orbits: No acute finding. Other:  None. CT CERVICAL SPINE FINDINGS Alignment: No traumatic malalignment. Skull base and vertebrae: No acute fracture. No primary bone lesion or focal pathologic process. Soft tissues and spinal canal: No prevertebral fluid or swelling. No visible canal hematoma. Disc levels: Similar multilevel disc height loss and bulky endplate spurring. Advanced bilateral facet uncovertebral hypertrophy again noted, with interval facet joint ankylosis on the right C4-C5. Upper chest: Negative. Other: None. IMPRESSION: 1. No acute intracranial abnormality. 2. No acute cervical spine fracture or traumatic listhesis. Electronically Signed   By: Titus Dubin M.D.   On: 05/14/2022 12:25   DG Chest Port 1 View  Result Date: 05/14/2022 CLINICAL DATA:  Questionable sepsis. EXAM: PORTABLE CHEST 1  VIEW COMPARISON:  Chest radiograph dated May 06, 2021 FINDINGS: Heart is normal in size. Prosthetic aortic valve is noted. Low lung volumes without evidence of focal consolidation or pleural effusion. Partially imaged left shoulder arthroplasty. Thoracic spondylosis. IMPRESSION: No acute cardiopulmonary disease. Electronically Signed   By: Keane Police D.O.   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 per ID -Chest x-ray on admission without findings of pneumonia -Treating empirically with Rocephin and Zithromax -In lieu of thrombocytopenia and tachycardia with circulatory shock will obtain CT chest to rule out PE Blood cultures x2 ordered -MRSA PCR -Procalcitonin trend  Portal hypertensive gastropathy -Protonix IV 40 mg daily -Empirically on Rocephin for possible SBP -Clinically does not have distended abdomen or fluid shift and abdominal exam   Renal Failure-acute stage II -Likely septic nephropathy -follow chem 7 -follow UO -continue Foley Catheter-assess need daily   NEUROLOGY - altered mental status with lethargy -Suspect septic encephalopathy Wake up assessment pending   Liver cirrhosis with coagulopathy -Complicated by thrombocytopenia -No DVT prophylaxis at this time -SCDs only   ID -continue IV abx as prescibed -follow up cultures  GI/Nutrition GI PROPHYLAXIS as indicated DIET-->TF's as tolerated Constipation protocol as indicated  ENDO - ICU hypoglycemic\Hyperglycemia protocol -check FSBS per protocol   ELECTROLYTES -follow labs as needed -replace as needed -pharmacy consultation   DVT/GI PRX ordered -SCDs  TRANSFUSIONS AS NEEDED MONITOR FSBS ASSESS the need for LABS as needed  Critical care provider statement:   Total critical care time: 33 minutes   Performed by: Lanney Gins  MD   Critical care time was exclusive of separately billable procedures and treating other patients.   Critical care was necessary to treat or prevent imminent or life-threatening deterioration.   Critical care was time spent personally by me on the following activities: development of treatment plan with patient and/or surrogate as well as nursing, discussions with consultants, evaluation of patient's response to treatment, examination of patient, obtaining history from patient or surrogate, ordering and performing treatments and interventions, ordering and review of laboratory studies, ordering and review of radiographic studies, pulse oximetry and re-evaluation of patient's condition.    Ottie Glazier, M.D.  Pulmonary & Critical Care Medicine      This document was prepared using Dragon voice recognition software and may include unintentional dictation errors.    Ottie Glazier, M.D.  Division of [facility]

## 2022-05-15 NOTE — Progress Notes (Signed)
Inpatient Diabetes Program Recommendations  AACE/ADA: New Consensus Statement on Inpatient Glycemic Control (2015)  Target Ranges:  Prepandial:   less than 140 mg/dL      Peak postprandial:   less than 180 mg/dL (1-2 hours)      Critically ill patients:  140 - 180 mg/dL   Lab Results  Component Value Date   GLUCAP 182 (H) 05/15/2022   HGBA1C 6.0 (H) 04/07/2022    Review of Glycemic Control  Latest Reference Range & Units 05/14/22 21:24 05/15/22 03:39  Glucose-Capillary 70 - 99 mg/dL 193 (H) 182 (H)  (H): Data is abnormally high  Diabetes history: DM2 Outpatient Diabetes medications:  Lantus 14 units QD Novolog 16-22 units TID  Current orders for Inpatient glycemic control: CBGs  Inpatient Diabetes Program Recommendations:    Glycemic control order set using Novolog 0-9 units Q4H.  Will continue to follow while inpatient.  Thank you, Reche Dixon, MSN, [facility] Coordinator Inpatient Diabetes Program [phone] (team pager from 8a-5p)

## 2022-05-15 NOTE — Progress Notes (Signed)
Pharmacy Antibiotic Note  Joshua Berry is a 76 y.o. male admitted on 05/14/2022 with bacteremia.  Pharmacy has been consulted for Vancomycin dosing.  Plan: Pt given initial dose of Vancomycin 2250 mg Vancomycin 2000 mg IV Q 24 hrs.  Goal AUC 400-550. Expected AUC: 522.5 SCr used: 1.29  Pharmacy will continue to follow and will adjust abx dosing whenever warranted.   Height: 6' 2"  (188 cm) Weight: 104.9 kg (231 lb 4.2 oz) IBW/kg (Calculated) : 82.2  Temp (24hrs), Avg:100.2 F (37.9 C), Min:99.8 F (37.7 C), Max:100.6 F (38.1 C)  Recent Labs  Lab 05/14/22 1104 05/14/22 1304 05/14/22 1500  WBC 4.2  --   --   CREATININE 1.29*  --   --   LATICACIDVEN 3.6* 3.6* 2.9*    Estimated Creatinine Clearance: 61.9 mL/min (A) (by C-G formula based on SCr of 1.29 mg/dL (H)).    Allergies  Allergen Reactions   Lisinopril Hives   Sulfamethoxazole Swelling   Camphor Rash and Swelling   Latex Rash   Nickel Rash    Antimicrobials this admission: 5/30 Azithromycin >>  5/30 Cefepime >> x 1 dose 5/30 Ceftriaxone >> x 1 dose 5/30 Vancomycin >>  Microbiology results: 5/30 BCx: 4 of 4 bottles, Staph Species, NR  Thank you for allowing pharmacy to be a part of this patient's care.  Renda Rolls, PharmD, [facility] 05/15/2022 3:05 AM

## 2022-05-15 NOTE — Progress Notes (Signed)
PHARMACY - PHYSICIAN COMMUNICATION CRITICAL VALUE ALERT - BLOOD CULTURE IDENTIFICATION (BCID)  BCID results for this pt:  4 of 4 bottles with Staph Species, no resistance detected.  Pt currently on Ceftriaxone and Azithromycin.  Results for orders placed or performed during the hospital encounter of 12/20/18  Blood Culture ID Panel (Reflexed) (Collected: 12/19/2018 10:31 PM)  Result Value Ref Range   Enterococcus species NOT DETECTED NOT DETECTED   Listeria monocytogenes NOT DETECTED NOT DETECTED   Staphylococcus species NOT DETECTED NOT DETECTED   Staphylococcus aureus (BCID) NOT DETECTED NOT DETECTED   Streptococcus species DETECTED (A) NOT DETECTED   Streptococcus agalactiae DETECTED (A) NOT DETECTED   Streptococcus pneumoniae NOT DETECTED NOT DETECTED   Streptococcus pyogenes NOT DETECTED NOT DETECTED   Acinetobacter baumannii NOT DETECTED NOT DETECTED   Enterobacteriaceae species NOT DETECTED NOT DETECTED   Enterobacter cloacae complex NOT DETECTED NOT DETECTED   Escherichia coli NOT DETECTED NOT DETECTED   Klebsiella oxytoca NOT DETECTED NOT DETECTED   Klebsiella pneumoniae NOT DETECTED NOT DETECTED   Proteus species NOT DETECTED NOT DETECTED   Serratia marcescens NOT DETECTED NOT DETECTED   Haemophilus influenzae NOT DETECTED NOT DETECTED   Neisseria meningitidis NOT DETECTED NOT DETECTED   Pseudomonas aeruginosa NOT DETECTED NOT DETECTED   Candida albicans NOT DETECTED NOT DETECTED   Candida glabrata NOT DETECTED NOT DETECTED   Candida krusei NOT DETECTED NOT DETECTED   Candida parapsilosis NOT DETECTED NOT DETECTED   Candida tropicalis NOT DETECTED NOT DETECTED    Name of provider contacted: Tana Conch, NP  Changes to prescribed antibiotics required: Transition pt from Ceftriaxone to Vancomycin, continue Azithromycin for pneumonia coverage.  Renda Rolls, PharmD, [facility] 05/15/2022 2:54 AM

## 2022-05-15 NOTE — Consult Note (Signed)
NAME: Joshua Berry  DOB: 19-Mar-1946  MRN: [id number]  Date/Time: 05/15/2022 1:15 PM  REQUESTING PROVIDER: Lanney Gins Subjective:  REASON FOR CONSULT: bacteremia ?charyt reviewed in detail, patient gave history as well Joshua Berry is a 76 y.o. with a history of HLD, TAVR, TIPS, rt shoulder replacement, liver cirrhosis, pancytopenia, upper GI bleed , malignant melanoma, OSA,  Presents with weakness and falling- says he slid and fell down.Has  low back pain since the fall, says he hit his back on the chest of draw Says he has not been well for the past few days , has fever as well. On Monday it was 102 In the ED temp 100.6, BP 110/58, HR 70 and stas 100%. Wbc 4.2, HB 11.8, plt 35 No cough or sob, no hematuria, dysuria or difficulty in passing urine, no pain abdomen, no diarrhea Says he is confused and always try to find the right words Has a cane and walker Used to be active  until a few months ago, Poor dental - is planning to get all the remaining teeth removed No travel Last steroid injection rt knee march , 15, 2023 No animal contact   PMH DM Cirrhosis liver Aortic stenosis Hypothyroidism OSA NASH Esophageal varices GAVE BPH Malignant melanoma Group B strep bacteremia in 2020 with ? MRI then showed glenohumeral infection with epidural enhancement MRI neck -Mild smooth epidural thickening and enhancement posteriorly extending from C2 to below the field of view into the thoracic spine as well as diffuse paraspinal muscle enhancement. Findings may represent a primary myositis (rhabdomyolysis, infection, autoimmune) with reactive epidural inflammation, however, epidural infection is not excluded.   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  12/202 Total shoulder arthroplasty left Upper GI endoscopy X 14 yrs Cholecystectomy Variceal banding  X 9   Social History   Socioeconomic History   Marital status: Married    Spouse name: Not on file   Number of children: Not on file   Years of  education: Not on file   Highest education level: Not on file  Occupational History   Not on file  Tobacco Use   Smoking status: Former    Packs/day: 0.50    Years: 5.00    Pack years: 2.50    Types: Cigarettes    Quit date: 10/16/1968    Years since quitting: 53.6   Smokeless tobacco: Never  Vaping Use   Vaping Use: Never used  Substance and Sexual Activity   Alcohol use: Not Currently   Drug use: Not Currently   Sexual activity: Not on file  Other Topics Concern   Not on file  Social History Narrative   Not on file   Social Determinants of Health   Financial Resource Strain: Not on file  Food Insecurity: Not on file  Transportation Needs: Not on file  Physical Activity: Not on file  Stress: Not on file  Social Connections: Not on file  Intimate Partner Violence: Not on file    Family History  Problem Relation Age of Onset   Heart attack Brother    Allergies  Allergen Reactions   Lisinopril Hives   Sulfamethoxazole Swelling   Camphor Rash and Swelling   Latex Rash   Nickel Rash   I? Current Facility-Administered Medications  Medication Dose Route Frequency Provider Last Rate Last Admin   azithromycin (ZITHROMAX) 500 mg in sodium chloride 0.9 % 250 mL IVPB  500 mg Intravenous Q24H Ottie Glazier, MD   Stopped at 05/14/22 2028   Chlorhexidine Gluconate Cloth  2 % PADS 6 each  6 each Topical Q0600 Ottie Glazier, MD   6 each at 05/15/22 0600   insulin aspart (novoLOG) injection 0-5 Units  0-5 Units Subcutaneous QHS Dallie Piles, RPH       insulin aspart (novoLOG) injection 0-9 Units  0-9 Units Subcutaneous TID WC Dallie Piles, RPH       lactulose (CHRONULAC) 10 GM/15ML solution 30 g  30 g Oral Daily Dallie Piles, [facility]       [START ON 05/16/2022] levothyroxine (SYNTHROID) tablet 100 mcg  100 mcg Oral Q0600 Dallie Piles, RPH       norepinephrine (LEVOPHED) 28m in 2552m(0.016 mg/mL) premix infusion  0-40 mcg/min Intravenous Continuous SiArta Silence MD   Stopped at 05/14/22 1500   pantoprazole (PROTONIX) injection 40 mg  40 mg Intravenous QHS AlOttie GlazierMD   40 mg at 05/14/22 2300   polyethylene glycol (MIRALAX / GLYCOLAX) packet 17 g  17 g Oral Daily PRN AlOttie GlazierMD       rifaximin (XDoreene Nesttablet 550 mg  550 mg Oral BID GrDallie PilesRPH       vancomycin (VAlcus DadIVPB 2000 mg/400 mL  2,000 mg Intravenous Q24H BeRenda RollsRPH 200 mL/hr at 05/15/22 1216 2,000 mg at 05/15/22 1216     Abtx:  Anti-infectives (From admission, onward)    Start     Dose/Rate Route Frequency Ordered Stop   05/15/22 1400  rifaximin (XIFAXAN) tablet 550 mg        550 mg Oral 2 times daily 05/15/22 1229     05/15/22 1000  vancomycin (VANCOREADY) IVPB 2000 mg/400 mL        2,000 mg 200 mL/hr over 120 Minutes Intravenous Every 24 hours 05/15/22 0306     05/14/22 2000  cefTRIAXone (ROCEPHIN) 2 g in sodium chloride 0.9 % 100 mL IVPB  Status:  Discontinued        2 g 200 mL/hr over 30 Minutes Intravenous Every 24 hours 05/14/22 1617 05/15/22 0257   05/14/22 1800  azithromycin (ZITHROMAX) 500 mg in sodium chloride 0.9 % 250 mL IVPB        500 mg 250 mL/hr over 60 Minutes Intravenous Every 24 hours 05/14/22 1617     05/14/22 1330  vancomycin (VANCOREADY) IVPB 1250 mg/250 mL        1,250 mg 166.7 mL/hr over 90 Minutes Intravenous  Once 05/14/22 1220 05/14/22 1435   05/14/22 1215  ceFEPIme (MAXIPIME) 2 g in sodium chloride 0.9 % 100 mL IVPB        2 g 200 mL/hr over 30 Minutes Intravenous  Once 05/14/22 1202 05/14/22 1300   05/14/22 1215  vancomycin (VANCOCIN) IVPB 1000 mg/200 mL premix        1,000 mg 200 mL/hr over 60 Minutes Intravenous  Once 05/14/22 1202 05/14/22 1331       REVIEW OF SYSTEMS:  Const: fever,  chills, no night sweats 30 pounds weight loss in the past 2 months Eyes: negative diplopia or visual changes, negative eye pain ENT: negative coryza, negative sore throat Resp: negative cough, hemoptysis, dyspnea Cards:  negative for chest pain, palpitations, lower extremity edema GU: negative for frequency, dysuria and hematuria GI: Negative for abdominal pain, diarrhea, bleeding, constipation Skin: negative for rash and pruritus Heme: negative for easy bruising and gum/nose bleeding MSKD:XIPJain Neurolo:negative for headaches, dizziness, vertigo, has memory problems and also finding words Psych: negative for feelings of anxiety, depression  Endocrine: has DM and hypothyroidism Allergy/Immunology- as above  Objective:  VITALS:  BP (!) 99/52   Pulse (!) 58   Temp 98.7 F (37.1 C) (Oral)   Resp 16   Ht 6' 2"  (1.88 m)   Wt 104.9 kg   SpO2 93%   BMI 29.69 kg/m   PHYSICAL EXAM:  General: Alert, cooperative, no distress, appears stated age. Oriented X 4 Slow to respond but recollects all history and at times have difficulty finding words Head: Normocephalic, without obvious abnormality, atraumatic. Eyes: Conjunctivae clear, anicteric sclerae. Pupils are equal ENT Nares normal. No drainage or sinus tenderness. Poor dentition- broken, Neck: Supple, symmetrical, no adenopathy, thyroid: non tender no carotid bruit and no JVD. Back: No CVA tenderness. Tenderness lower lumbar area Lungs: Clear to auscultation bilaterally. No Wheezing or Rhonchi. No rales. Heart: Regular rate and rhythm, no murmur, rub or gallop. Abdomen: Soft, non-tender,not distended. Bowel sounds normal. No masses Extremities: atraumatic, no cyanosis. No edema. No clubbing Skin: No rashes or lesions. Or bruising Lymph: Cervical, supraclavicular normal. Neurologic: Grossly non-focal Pertinent Labs Lab Results CBC    Component Value Date/Time   WBC 3.8 (L) 05/15/2022 0323   RBC 3.13 (L) 05/15/2022 0323   HGB 10.1 (L) 05/15/2022 0323   HGB 12.3 (L) 05/01/2014 1553   HCT 29.0 (L) 05/15/2022 0323   HCT 36.6 (L) 05/01/2014 1553   PLT 26 (LL) 05/15/2022 0323   PLT 72 (L) 05/01/2014 1553   MCV 92.7 05/15/2022 0323   MCV 96  05/01/2014 1553   MCH 32.3 05/15/2022 0323   MCHC 34.8 05/15/2022 0323   RDW 14.9 05/15/2022 0323   RDW 15.8 (H) 05/01/2014 1553   LYMPHSABS 0.3 (L) 05/14/2022 1104   MONOABS 0.3 05/14/2022 1104   EOSABS 0.1 05/14/2022 1104   BASOSABS 0.0 05/14/2022 1104       Latest Ref Rng & Units 05/15/2022    3:23 AM 05/14/2022   11:04 AM 04/07/2022    5:57 AM  CMP  Glucose 70 - 99 mg/dL 193   185   118    BUN 8 - 23 mg/dL 17   18   15     Creatinine 0.61 - 1.24 mg/dL 1.26   1.29   1.11    Sodium 135 - 145 mmol/L 136   136   138    Potassium 3.5 - 5.1 mmol/L 4.0   4.0   3.8    Chloride 98 - 111 mmol/L 108   104   108    CO2 22 - 32 mmol/L 23   20   25     Calcium 8.9 - 10.3 mg/dL 8.0   8.8   8.9    Total Protein 6.5 - 8.1 g/dL 5.6   6.8     Total Bilirubin 0.3 - 1.2 mg/dL 1.9   2.3     Alkaline Phos 38 - 126 U/L 72   86     AST 15 - 41 U/L 92   76     ALT 0 - 44 U/L 55   57         Microbiology: Recent Results (from the past 240 hour(s))  Blood Culture (routine x 2)     Status: None (Preliminary result)   Collection Time: 05/14/22 11:06 AM   Specimen: BLOOD  Result Value Ref Range Status   Specimen Description   Final    BLOOD BLOOD RIGHT FOREARM Performed at [facility], [address] [place], [place]  [address]    Special Requests   Final    BOTTLES DRAWN AEROBIC AND ANAEROBIC Blood Culture adequate volume Performed at [facility], [address] [place], [place] [address]    Culture  Setup Time   Final    Organism ID to follow GRAM POSITIVE COCCI IN BOTH AEROBIC AND ANAEROBIC BOTTLES CRITICAL RESULT CALLED TO, READ BACK BY AND VERIFIED WITH: NATHAN BELUTH @ 0923 ON 05/15/2022.Marland KitchenMarland KitchenTKR    Culture GRAM POSITIVE COCCI  Final   Report Status PENDING  Incomplete  Blood Culture ID Panel (Reflexed)     Status: Abnormal   Collection Time: 05/14/22 11:06 AM  Result Value Ref Range Status   Enterococcus faecalis NOT DETECTED NOT DETECTED Final   Enterococcus  Faecium NOT DETECTED NOT DETECTED Final   Listeria monocytogenes NOT DETECTED NOT DETECTED Final   Staphylococcus species DETECTED (A) NOT DETECTED Final    Comment: NATHAN BELUTH @ 3007 ON 05/15/2022.Marland KitchenMarland KitchenTKR   Staphylococcus aureus (BCID) NOT DETECTED NOT DETECTED Final   Staphylococcus epidermidis NOT DETECTED NOT DETECTED Final   Staphylococcus lugdunensis NOT DETECTED NOT DETECTED Final   Streptococcus species NOT DETECTED NOT DETECTED Final   Streptococcus agalactiae NOT DETECTED NOT DETECTED Final   Streptococcus pneumoniae NOT DETECTED NOT DETECTED Final   Streptococcus pyogenes NOT DETECTED NOT DETECTED Final   A.calcoaceticus-baumannii NOT DETECTED NOT DETECTED Final   Bacteroides fragilis NOT DETECTED NOT DETECTED Final   Enterobacterales NOT DETECTED NOT DETECTED Final   Enterobacter cloacae complex NOT DETECTED NOT DETECTED Final   Escherichia coli NOT DETECTED NOT DETECTED Final   Klebsiella aerogenes NOT DETECTED NOT DETECTED Final   Klebsiella oxytoca NOT DETECTED NOT DETECTED Final   Klebsiella pneumoniae NOT DETECTED NOT DETECTED Final   Proteus species NOT DETECTED NOT DETECTED Final   Salmonella species NOT DETECTED NOT DETECTED Final   Serratia marcescens NOT DETECTED NOT DETECTED Final   Haemophilus influenzae NOT DETECTED NOT DETECTED Final   Neisseria meningitidis NOT DETECTED NOT DETECTED Final   Pseudomonas aeruginosa NOT DETECTED NOT DETECTED Final   Stenotrophomonas maltophilia NOT DETECTED NOT DETECTED Final   Candida albicans NOT DETECTED NOT DETECTED Final   Candida auris NOT DETECTED NOT DETECTED Final   Candida glabrata NOT DETECTED NOT DETECTED Final   Candida krusei NOT DETECTED NOT DETECTED Final   Candida parapsilosis NOT DETECTED NOT DETECTED Final   Candida tropicalis NOT DETECTED NOT DETECTED Final   Cryptococcus neoformans/gattii NOT DETECTED NOT DETECTED Final    Comment: Performed at [facility], [address] [place], [place]  [address]  Blood Culture (routine x 2)     Status: None (Preliminary result)   Collection Time: 05/14/22 11:22 AM   Specimen: BLOOD  Result Value Ref Range Status   Specimen Description   Final    BLOOD RIGHT ARM Performed at [facility], [address] [place], [place] [address]    Special Requests   Final    BOTTLES DRAWN AEROBIC AND ANAEROBIC Blood Culture results may not be optimal due to an excessive volume of blood received in culture bottles Performed at [facility], [address] [place], [place] [address]    Culture  Setup Time   Final    GRAM POSITIVE COCCI IN BOTH AEROBIC AND ANAEROBIC BOTTLES CRITICAL VALUE NOTED.  VALUE IS CONSISTENT WITH PREVIOUSLY REPORTED AND CALLED VALUE.    Culture GRAM POSITIVE COCCI  Final   Report Status PENDING  Incomplete  Resp Panel by RT-PCR (Flu  A&B, Covid) Anterior Nasal Swab     Status: None   Collection Time: 05/14/22 12:04 PM   Specimen: Anterior Nasal Swab  Result Value Ref Range Status   SARS Coronavirus 2 by RT PCR NEGATIVE NEGATIVE Final    Comment: (NOTE) SARS-CoV-2 target nucleic acids are NOT DETECTED.  The SARS-CoV-2 RNA is generally detectable in upper respiratory specimens during the acute phase of infection. The lowest concentration of SARS-CoV-2 viral copies this assay can detect is 138 copies/mL. A negative result does not preclude SARS-Cov-2 infection and should not be used as the sole basis for treatment or other patient management decisions. A negative result may occur with  improper specimen collection/handling, submission of specimen other than nasopharyngeal swab, presence of viral mutation(s) within the areas targeted by this assay, and inadequate number of viral copies(<138 copies/mL). A negative result must be combined with clinical observations, patient history, and epidemiological information. The expected result is Negative.  Fact Sheet for Patients:   EntrepreneurPulse.com.au  Fact Sheet for Healthcare Providers:  IncredibleEmployment.be  This test is no t yet approved or cleared by the [place] FDA and  has been authorized for detection and/or diagnosis of SARS-CoV-2 by FDA under an Emergency Use Authorization (EUA). This EUA will remain  in effect (meaning this test can be used) for the duration of the COVID-19 declaration under Section 564(b)(1) of the Act, 21 U.S.C.section 360bbb-3(b)(1), unless the authorization is terminated  or revoked sooner.       Influenza A by PCR NEGATIVE NEGATIVE Final   Influenza B by PCR NEGATIVE NEGATIVE Final    Comment: (NOTE) The Xpert Xpress SARS-CoV-2/FLU/RSV plus assay is intended as an aid in the diagnosis of influenza from Nasopharyngeal swab specimens and should not be used as a sole basis for treatment. Nasal washings and aspirates are unacceptable for Xpert Xpress SARS-CoV-2/FLU/RSV testing.  Fact Sheet for Patients: EntrepreneurPulse.com.au  Fact Sheet for Healthcare Providers: IncredibleEmployment.be  This test is not yet approved or cleared by the [place] FDA and has been authorized for detection and/or diagnosis of SARS-CoV-2 by FDA under an Emergency Use Authorization (EUA). This EUA will remain in effect (meaning this test can be used) for the duration of the COVID-19 declaration under Section 564(b)(1) of the Act, 21 U.S.C. section 360bbb-3(b)(1), unless the authorization is terminated or revoked.  Performed at [facility], [address] [place], [place] [address]   MRSA Next Gen by PCR, Nasal     Status: None   Collection Time: 05/14/22  9:29 PM   Specimen: Nasal Mucosa; Nasal Swab  Result Value Ref Range Status   MRSA by PCR Next Gen NOT DETECTED NOT DETECTED Final    Comment: (NOTE) The GeneXpert MRSA Assay (FDA approved for NASAL specimens only), is one component of a  comprehensive MRSA colonization surveillance program. It is not intended to diagnose MRSA infection nor to guide or monitor treatment for MRSA infections. Test performance is not FDA approved in patients less than 96 years old. Performed at [facility], [address] [place], [place] [address]    CXR  IMAGING RESULTS: CT chest Splenomegaly TIOS cirrhosis I have personally reviewed the films ? Impression/Recommendation ?pt with h./o TAVR, cirrhosis liver , DM PHT, severe thrombocytopenia, pancytopenia presents with fall, confusion, fever   Sepsis  Coag neg staph bacteremia 4/4= with TAVR concern for endocarditis Agree with vanco Watch closely cr Pt needs 2 d echo and may be TEE Will need MRI of the lumbar  spine  Poor dentition- waiting for extraction of all remaining teeth  AKI- improving  DM- on insulin  NASH- cirrhosis- decompensated Ascites Has TIPS  Anemia- h/o GAVE , esophageal varices  Leucopenia- could be Cirrhosis related/infection could be contributing ? ? ___________________________________________________ Discussed with patient, requesting provider Note:  This document was prepared using Dragon voice recognition software and may include unintentional dictation errors.

## 2022-05-15 NOTE — Consult Note (Signed)
PHARMACY CONSULT NOTE  Pharmacy Consult for Electrolyte Monitoring and Replacement   Recent Labs: Potassium (mmol/L)  Date Value  05/15/2022 4.0  05/01/2014 4.2   Magnesium (mg/dL)  Date Value  05/15/2022 1.5 (L)   Calcium (mg/dL)  Date Value  05/15/2022 8.0 (L)   Calcium, Total (mg/dL)  Date Value  05/01/2014 9.4   Albumin (g/dL)  Date Value  05/15/2022 2.7 (L)  05/01/2014 4.2   Phosphorus (mg/dL)  Date Value  05/15/2022 3.3   Sodium (mmol/L)  Date Value  05/15/2022 136  05/01/2014 136   Corrected Ca: 9.0 mg/dL  Assessment: Patient is a 76 y/o M with medical history including HTN, HLD, DM, liver cirrhosis s/t NASH, s/p TIPS, GIB, portal hypertension c/b ascites, anemia, melanoma, s/p TAVR, diastolic CHF, OSA who presented to the ED 5/30 with AMS x 2-3 days. Patient subsequently found to be in shock. Disposition is the ICU. Pharmacy consulted to assist with electrolyte monitoring and replacement as indicated.  Goal of Therapy:  Electrolytes within normal limits  Plan:  2 grams IV magnesium sulfate x 1 Follow-up electrolytes with AM labs tomorrow  Dallie Piles 05/15/2022 7:06 AM

## 2022-05-16 ENCOUNTER — Ambulatory Visit: Payer: No Typology Code available for payment source

## 2022-05-16 DIAGNOSIS — D61818 Other pancytopenia: Secondary | ICD-10-CM

## 2022-05-16 DIAGNOSIS — E039 Hypothyroidism, unspecified: Secondary | ICD-10-CM

## 2022-05-16 DIAGNOSIS — I5032 Chronic diastolic (congestive) heart failure: Secondary | ICD-10-CM

## 2022-05-16 DIAGNOSIS — E872 Acidosis, unspecified: Secondary | ICD-10-CM

## 2022-05-16 DIAGNOSIS — N179 Acute kidney failure, unspecified: Secondary | ICD-10-CM | POA: Diagnosis not present

## 2022-05-16 DIAGNOSIS — M25511 Pain in right shoulder: Secondary | ICD-10-CM

## 2022-05-16 DIAGNOSIS — E1165 Type 2 diabetes mellitus with hyperglycemia: Secondary | ICD-10-CM

## 2022-05-16 DIAGNOSIS — R748 Abnormal levels of other serum enzymes: Secondary | ICD-10-CM

## 2022-05-16 DIAGNOSIS — R5381 Other malaise: Secondary | ICD-10-CM

## 2022-05-16 DIAGNOSIS — G8929 Other chronic pain: Secondary | ICD-10-CM

## 2022-05-16 DIAGNOSIS — R7881 Bacteremia: Secondary | ICD-10-CM

## 2022-05-16 DIAGNOSIS — Z794 Long term (current) use of insulin: Secondary | ICD-10-CM

## 2022-05-16 DIAGNOSIS — I959 Hypotension, unspecified: Secondary | ICD-10-CM

## 2022-05-16 DIAGNOSIS — K7460 Unspecified cirrhosis of liver: Secondary | ICD-10-CM

## 2022-05-16 DIAGNOSIS — R6521 Severe sepsis with septic shock: Secondary | ICD-10-CM

## 2022-05-16 DIAGNOSIS — W19XXXA Unspecified fall, initial encounter: Secondary | ICD-10-CM

## 2022-05-16 DIAGNOSIS — A412 Sepsis due to unspecified staphylococcus: Secondary | ICD-10-CM

## 2022-05-16 DIAGNOSIS — B957 Other staphylococcus as the cause of diseases classified elsewhere: Secondary | ICD-10-CM | POA: Diagnosis not present

## 2022-05-16 DIAGNOSIS — E669 Obesity, unspecified: Secondary | ICD-10-CM

## 2022-05-16 DIAGNOSIS — K766 Portal hypertension: Secondary | ICD-10-CM

## 2022-05-16 LAB — BASIC METABOLIC PANEL
Anion gap: 5 (ref 5–15)
BUN: 15 mg/dL (ref 8–23)
CO2: 24 mmol/L (ref 22–32)
Calcium: 8.2 mg/dL — ABNORMAL LOW (ref 8.9–10.3)
Chloride: 107 mmol/L (ref 98–111)
Creatinine, Ser: 0.96 mg/dL (ref 0.61–1.24)
GFR, Estimated: >60 mL/min (ref >60)
Glucose, Bld: 167 mg/dL — ABNORMAL HIGH (ref 70–99)
Potassium: 3.7 mmol/L (ref 3.5–5.1)
Sodium: 136 mmol/L (ref 135–145)

## 2022-05-16 LAB — GLUCOSE, CAPILLARY
Glucose-Capillary: 151 mg/dL — ABNORMAL HIGH (ref 70–99)
Glucose-Capillary: 278 mg/dL — ABNORMAL HIGH (ref 70–99)
Glucose-Capillary: 200 mg/dL — ABNORMAL HIGH (ref 70–99)
Glucose-Capillary: 184 mg/dL — ABNORMAL HIGH (ref 70–99)

## 2022-05-16 LAB — CBC
HCT: 28.6 % — ABNORMAL LOW (ref 39.0–52.0)
Hemoglobin: 9.8 g/dL — ABNORMAL LOW (ref 13.0–17.0)
MCH: 31.8 pg (ref 26.0–34.0)
MCHC: 34.3 g/dL (ref 30.0–36.0)
MCV: 92.9 fL (ref 80.0–100.0)
Platelets: 26 10*3/uL — CL (ref 150–400)
RBC: 3.08 MIL/uL — ABNORMAL LOW (ref 4.22–5.81)
RDW: 14.6 % (ref 11.5–15.5)
WBC: 4.8 10*3/uL (ref 4.0–10.5)
nRBC: 0 % (ref 0.0–0.2)

## 2022-05-16 LAB — MAGNESIUM: Magnesium: 1.7 mg/dL (ref 1.7–2.4)

## 2022-05-16 LAB — PROCALCITONIN: Procalcitonin: 0.29 ng/mL

## 2022-05-16 LAB — HEPATIC FUNCTION PANEL
ALT: 47 U/L — ABNORMAL HIGH (ref 0–44)
AST: 80 U/L — ABNORMAL HIGH (ref 15–41)
Albumin: 2.6 g/dL — ABNORMAL LOW (ref 3.5–5.0)
Alkaline Phosphatase: 63 U/L (ref 38–126)
Bilirubin, Direct: 0.4 mg/dL — ABNORMAL HIGH (ref 0.0–0.2)
Indirect Bilirubin: 1.4 mg/dL — ABNORMAL HIGH (ref 0.3–0.9)
Total Bilirubin: 1.8 mg/dL — ABNORMAL HIGH (ref 0.3–1.2)
Total Protein: 5.5 g/dL — ABNORMAL LOW (ref 6.5–8.1)

## 2022-05-16 MED ORDER — FENTANYL CITRATE PF 50 MCG/ML IJ SOSY: 25.0000 ug | PREFILLED_SYRINGE | INTRAMUSCULAR | Status: DC | PRN

## 2022-05-16 MED ORDER — LIDOCAINE 5 % EX PTCH
1.0000 | MEDICATED_PATCH | CUTANEOUS | Status: DC
  Administered 2022-05-16 – 2022-05-25 (×10): 1 via TRANSDERMAL
  Filled 2022-05-16 (×11): qty 1

## 2022-05-16 MED ORDER — MORPHINE SULFATE (PF) 2 MG/ML IV SOLN
2.0000 mg | Freq: Once | INTRAVENOUS | Status: DC
  Filled 2022-05-16: qty 1

## 2022-05-16 MED ORDER — PANTOPRAZOLE SODIUM 40 MG PO TBEC
40.0000 mg | DELAYED_RELEASE_TABLET | Freq: Every day | ORAL | Status: DC
  Administered 2022-05-16 – 2022-05-28 (×13): 40 mg via ORAL
  Filled 2022-05-16 (×13): qty 1

## 2022-05-16 MED ORDER — TRAMADOL HCL 50 MG PO TABS
50.0000 mg | ORAL_TABLET | Freq: Two times a day (BID) | ORAL | Status: DC | PRN
  Administered 2022-05-16: 50 mg via ORAL
  Filled 2022-05-16: qty 1

## 2022-05-16 MED ORDER — ACETAMINOPHEN 325 MG PO TABS
650.0000 mg | ORAL_TABLET | Freq: Three times a day (TID) | ORAL | Status: DC | PRN
  Administered 2022-05-18 – 2022-05-28 (×15): 650 mg via ORAL
  Filled 2022-05-16 (×15): qty 2

## 2022-05-16 MED ORDER — INSULIN ASPART 100 UNIT/ML IJ SOLN
0.0000 [IU] | Freq: Three times a day (TID) | INTRAMUSCULAR | Status: DC
  Administered 2022-05-16 – 2022-05-17 (×3): 3 [IU] via SUBCUTANEOUS
  Filled 2022-05-16 (×3): qty 1

## 2022-05-16 MED ORDER — MIDODRINE HCL 5 MG PO TABS
5.0000 mg | ORAL_TABLET | Freq: Three times a day (TID) | ORAL | Status: DC
  Administered 2022-05-16 – 2022-05-25 (×28): 5 mg via ORAL
  Filled 2022-05-16 (×28): qty 1

## 2022-05-16 MED ORDER — MAGNESIUM SULFATE 2 GM/50ML IV SOLN
2.0000 g | Freq: Once | INTRAVENOUS | Status: AC
  Administered 2022-05-16: 2 g via INTRAVENOUS
  Filled 2022-05-16: qty 50

## 2022-05-16 MED ORDER — TRAMADOL HCL 50 MG PO TABS: 50.0000 mg | ORAL_TABLET | Freq: Three times a day (TID) | ORAL | Status: DC | PRN

## 2022-05-16 MED ORDER — INSULIN ASPART 100 UNIT/ML IJ SOLN: 0.0000 [IU] | Freq: Every day | INTRAMUSCULAR | Status: DC

## 2022-05-16 MED ORDER — OXYCODONE HCL 5 MG PO TABS
5.0000 mg | ORAL_TABLET | Freq: Four times a day (QID) | ORAL | Status: DC | PRN
  Administered 2022-05-16 – 2022-05-19 (×6): 5 mg via ORAL
  Filled 2022-05-16 (×7): qty 1

## 2022-05-16 MED ORDER — INSULIN GLARGINE-YFGN 100 UNIT/ML ~~LOC~~ SOLN
10.0000 [IU] | Freq: Every day | SUBCUTANEOUS | Status: DC
  Administered 2022-05-16: 10 [IU] via SUBCUTANEOUS
  Filled 2022-05-16 (×2): qty 0.1

## 2022-05-16 MED ORDER — TRAMADOL HCL 50 MG PO TABS: 50.0000 mg | ORAL_TABLET | Freq: Two times a day (BID) | ORAL | Status: DC

## 2022-05-16 MED ORDER — LACTATED RINGERS IV BOLUS
1000.0000 mL | Freq: Once | INTRAVENOUS | Status: AC
  Administered 2022-05-16: 1000 mL via INTRAVENOUS

## 2022-05-16 NOTE — Assessment & Plan Note (Signed)
Resolved.   -Continue low-dose midodrine

## 2022-05-16 NOTE — Progress Notes (Signed)
CRITICAL CARE     Name: Joshua Berry MRN: [id number] DOB: [birth year]     LOS: 2   SUBJECTIVE FINDINGS & SIGNIFICANT EVENTS    Patient description:  This is a 76 year old male with history of diabetes, liver cirrhosis, diastolic CHF essential hypertension dyslipidemia came in with malaise and altered mental status.  Vital signs showed shock physiology status post 3.5 L resuscitative IV fluids with minimal improvement requiring Levophed vasopressor support.  Also low-grade fever over 100.1 noted on admission.  CBC with severe thrombocytopenia at 35, BMP showing AKI and mild lactic acidosis.  Urinalysis is reassuring without leukocyte esterase normal pH and no ketones.  During my evaluation patient is lethargic and noncommunicative requiring low-dose Levophed peripherally.   05/15/22- patient is improved and is off levophed. He is mentating better.  We are planning to do swallow evaluation.  ID consult for endocarditis workup due to 4/4 staph +cultures.    05/16/22- patient is improved and mentation is better. Will transition to [facility] service today. 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   Lines/tubes : External Urinary Catheter (Active)  Collection Container Dedicated Suction Canister 05/14/22 1343  Suction (Verified suction is between 40-80 mmHg) Yes 05/14/22 1343  Securement Method Mesh underwear 05/14/22 1343  Site Assessment Clean, Dry, Intact 05/14/22 1343    Microbiology/Sepsis markers: Results for orders placed or performed during the hospital encounter of 05/14/22  Blood Culture (routine x 2)     Status: None (Preliminary result)   Collection Time: 05/14/22 11:06 AM   Specimen: BLOOD  Result Value Ref Range Status   Specimen Description   Final    BLOOD BLOOD RIGHT  FOREARM Performed at [facility], [address] [place], [place] [address]    Special Requests   Final    BOTTLES DRAWN AEROBIC AND ANAEROBIC Blood Culture adequate volume Performed at [facility], [address] [place],  [address]    Culture  Setup Time   Final    Organism ID to follow [place] AND ANAEROBIC BOTTLES CRITICAL RESULT CALLED TO, READ BACK BY AND VERIFIED WITH: NATHAN BELUTH @ 0244 ON 05/15/2022.Marland KitchenMarland KitchenTKR    Culture GRAM POSITIVE COCCI  Final   Report Status PENDING  Incomplete  Blood Culture ID Panel (Reflexed)     Status: Abnormal   Collection Time: 05/14/22 11:06 AM  Result Value Ref Range Status   Enterococcus faecalis NOT DETECTED NOT DETECTED Final   Enterococcus Faecium NOT DETECTED NOT DETECTED Final   Listeria monocytogenes NOT DETECTED NOT DETECTED Final   Staphylococcus species DETECTED (A) NOT DETECTED Final    Comment: NATHAN BELUTH @ 1245 ON 05/15/2022.Marland KitchenMarland KitchenTKR   Staphylococcus aureus (BCID) NOT DETECTED NOT DETECTED Final   Staphylococcus epidermidis NOT DETECTED NOT DETECTED Final   Staphylococcus lugdunensis NOT DETECTED NOT DETECTED Final   Streptococcus species NOT DETECTED NOT DETECTED Final   Streptococcus agalactiae NOT DETECTED NOT DETECTED Final   Streptococcus pneumoniae NOT DETECTED NOT DETECTED Final   Streptococcus pyogenes NOT DETECTED NOT DETECTED Final   A.calcoaceticus-baumannii NOT DETECTED NOT DETECTED Final   Bacteroides fragilis NOT DETECTED NOT DETECTED Final   Enterobacterales NOT DETECTED NOT DETECTED Final   Enterobacter cloacae complex NOT DETECTED NOT DETECTED Final   Escherichia coli NOT DETECTED NOT DETECTED Final   Klebsiella aerogenes NOT DETECTED NOT DETECTED Final   Klebsiella oxytoca NOT DETECTED NOT DETECTED Final   Klebsiella pneumoniae NOT DETECTED NOT DETECTED Final  Proteus species NOT DETECTED NOT DETECTED Final   Salmonella species NOT DETECTED NOT DETECTED  Final   Serratia marcescens NOT DETECTED NOT DETECTED Final   Haemophilus influenzae NOT DETECTED NOT DETECTED Final   Neisseria meningitidis NOT DETECTED NOT DETECTED Final   Pseudomonas aeruginosa NOT DETECTED NOT DETECTED Final   Stenotrophomonas maltophilia NOT DETECTED NOT DETECTED Final   Candida albicans NOT DETECTED NOT DETECTED Final   Candida auris NOT DETECTED NOT DETECTED Final   Candida glabrata NOT DETECTED NOT DETECTED Final   Candida krusei NOT DETECTED NOT DETECTED Final   Candida parapsilosis NOT DETECTED NOT DETECTED Final   Candida tropicalis NOT DETECTED NOT DETECTED Final   Cryptococcus neoformans/gattii NOT DETECTED NOT DETECTED Final    Comment: Performed at [facility], [address] [place], [place] [address]  Blood Culture (routine x 2)     Status: None (Preliminary result)   Collection Time: 05/14/22 11:22 AM   Specimen: BLOOD  Result Value Ref Range Status   Specimen Description   Final    BLOOD RIGHT ARM Performed at [facility], [address] [place], [place] [address]    Special Requests   Final    BOTTLES DRAWN AEROBIC AND ANAEROBIC Blood Culture results may not be optimal due to an excessive volume of blood received in culture bottles Performed at [facility], [address] [place], [place] [address]    Culture  Setup Time   Final    GRAM POSITIVE COCCI IN BOTH AEROBIC AND ANAEROBIC BOTTLES CRITICAL VALUE NOTED.  VALUE IS CONSISTENT WITH PREVIOUSLY REPORTED AND CALLED VALUE.    Culture GRAM POSITIVE COCCI  Final   Report Status PENDING  Incomplete  Resp Panel by RT-PCR (Flu A&B, Covid) Anterior Nasal Swab     Status: None   Collection Time: 05/14/22 12:04 PM   Specimen: Anterior Nasal Swab  Result Value Ref Range Status   SARS Coronavirus 2 by RT PCR NEGATIVE NEGATIVE Final    Comment: (NOTE) SARS-CoV-2 target nucleic acids are NOT DETECTED.  The SARS-CoV-2 RNA is generally detectable in upper  respiratory specimens during the acute phase of infection. The lowest concentration of SARS-CoV-2 viral copies this assay can detect is 138 copies/mL. A negative result does not preclude SARS-Cov-2 infection and should not be used as the sole basis for treatment or other patient management decisions. A negative result may occur with  improper specimen collection/handling, submission of specimen other than nasopharyngeal swab, presence of viral mutation(s) within the areas targeted by this assay, and inadequate number of viral copies(<138 copies/mL). A negative result must be combined with clinical observations, patient history, and epidemiological information. The expected result is Negative.  Fact Sheet for Patients:  EntrepreneurPulse.com.au  Fact Sheet for Healthcare Providers:  IncredibleEmployment.be  This test is no t yet approved or cleared by the [place] FDA and  has been authorized for detection and/or diagnosis of SARS-CoV-2 by FDA under an Emergency Use Authorization (EUA). This EUA will remain  in effect (meaning this test can be used) for the duration of the COVID-19 declaration under Section 564(b)(1) of the Act, 21 U.S.C.section 360bbb-3(b)(1), unless the authorization is terminated  or revoked sooner.       Influenza A by PCR NEGATIVE NEGATIVE Final   Influenza B by PCR NEGATIVE NEGATIVE Final    Comment: (NOTE) The Xpert Xpress SARS-CoV-2/FLU/RSV plus assay is intended as an aid in the diagnosis of influenza from Nasopharyngeal swab specimens and should not  be used as a sole basis for treatment. Nasal washings and aspirates are unacceptable for Xpert Xpress SARS-CoV-2/FLU/RSV testing.  Fact Sheet for Patients: EntrepreneurPulse.com.au  Fact Sheet for Healthcare Providers: IncredibleEmployment.be  This test is not yet approved or cleared by the [place] FDA and has been  authorized for detection and/or diagnosis of SARS-CoV-2 by FDA under an Emergency Use Authorization (EUA). This EUA will remain in effect (meaning this test can be used) for the duration of the COVID-19 declaration under Section 564(b)(1) of the Act, 21 U.S.C. section 360bbb-3(b)(1), unless the authorization is terminated or revoked.  Performed at [facility], [address] [place], [place] [address]   MRSA Next Gen by PCR, Nasal     Status: None   Collection Time: 05/14/22  9:29 PM   Specimen: Nasal Mucosa; Nasal Swab  Result Value Ref Range Status   MRSA by PCR Next Gen NOT DETECTED NOT DETECTED Final    Comment: (NOTE) The GeneXpert MRSA Assay (FDA approved for NASAL specimens only), is one component of a comprehensive MRSA colonization surveillance program. It is not intended to diagnose MRSA infection nor to guide or monitor treatment for MRSA infections. Test performance is not FDA approved in patients less than 24 years old. Performed at [facility], [address], [place], [place] [address]     Anti-infectives:  Anti-infectives (From admission, onward)    Start     Dose/Rate Route Frequency Ordered Stop   05/15/22 1400  rifaximin (XIFAXAN) tablet 550 mg        550 mg Oral 2 times daily 05/15/22 1229     05/15/22 1000  vancomycin (VANCOREADY) IVPB 2000 mg/400 mL        2,000 mg 200 mL/hr over 120 Minutes Intravenous Every 24 hours 05/15/22 0306     05/14/22 2000  cefTRIAXone (ROCEPHIN) 2 g in sodium chloride 0.9 % 100 mL IVPB  Status:  Discontinued        2 g 200 mL/hr over 30 Minutes Intravenous Every 24 hours 05/14/22 1617 05/15/22 0257   05/14/22 1800  azithromycin (ZITHROMAX) 500 mg in sodium chloride 0.9 % 250 mL IVPB  Status:  Discontinued        500 mg 250 mL/hr over 60 Minutes Intravenous Every 24 hours 05/14/22 1617 05/15/22 1512   05/14/22 1330  vancomycin (VANCOREADY) IVPB 1250 mg/250 mL        1,250 mg 166.7 mL/hr over 90 Minutes  Intravenous  Once 05/14/22 1220 05/14/22 1435   05/14/22 1215  ceFEPIme (MAXIPIME) 2 g in sodium chloride 0.9 % 100 mL IVPB        2 g 200 mL/hr over 30 Minutes Intravenous  Once 05/14/22 1202 05/14/22 1300   05/14/22 1215  vancomycin (VANCOCIN) IVPB 1000 mg/200 mL premix        1,000 mg 200 mL/hr over 60 Minutes Intravenous  Once 05/14/22 1202 05/14/22 1331       PAST MEDICAL HISTORY   Past Medical History:  Diagnosis Date   Arthritis    Bleeding ulcer    Cancer ([place])    melanoma, carcinoma   Cirrhosis of liver ([place])    Diabetes mellitus without complication ([place])    Heart murmur    Hypertension    Iron deficiency anemia    NASH (nonalcoholic steatohepatitis)    Sleep apnea    Spleen enlarged    Thrombocytopenia ([place])      SURGICAL HISTORY   Past Surgical History:  Procedure  Laterality Date   CHOLECYSTECTOMY     COLONOSCOPY N/A 07/23/2019   Procedure: COLONOSCOPY;  Surgeon: Lin Landsman, MD;  Location: [facility] ENDOSCOPY;  Service: Gastroenterology;  Laterality: N/A;   ESOPHAGOGASTRODUODENOSCOPY N/A 07/20/2019   Procedure: ESOPHAGOGASTRODUODENOSCOPY (EGD);  Surgeon: Lin Landsman, MD;  Location: [facility] ENDOSCOPY;  Service: Gastroenterology;  Laterality: N/A;   ESOPHAGOGASTRODUODENOSCOPY Left 08/20/2019   Procedure: ESOPHAGOGASTRODUODENOSCOPY (EGD);  Surgeon: Virgel Manifold, MD;  Location: [facility] ENDOSCOPY;  Service: Endoscopy;  Laterality: Left;   ESOPHAGOGASTRODUODENOSCOPY (EGD) WITH PROPOFOL N/A 04/25/2021   Procedure: ESOPHAGOGASTRODUODENOSCOPY (EGD) WITH PROPOFOL;  Surgeon: Lin Landsman, MD;  Location: [facility] ENDOSCOPY;  Service: Gastroenterology;  Laterality: N/A;   SKIN BIOPSY     TEE WITHOUT CARDIOVERSION N/A 12/23/2018   Procedure: TRANSESOPHAGEAL ECHOCARDIOGRAM (TEE);  Surgeon: Teodoro Spray, MD;  Location: [facility] ORS;  Service: Cardiovascular;  Laterality: N/A;     FAMILY HISTORY   Family History  Problem Relation Age of Onset   Heart attack  Brother      SOCIAL HISTORY   Social History   Tobacco Use   Smoking status: Former    Packs/day: 0.50    Years: 5.00    Pack years: 2.50    Types: Cigarettes    Quit date: 10/16/1968    Years since quitting: 53.6   Smokeless tobacco: Never  Vaping Use   Vaping Use: Never used  Substance Use Topics   Alcohol use: Not Currently   Drug use: Not Currently     MEDICATIONS   Current Medication:  Current Facility-Administered Medications:    Chlorhexidine Gluconate Cloth 2 % PADS 6 each, 6 each, Topical, Q0600, Ottie Glazier, MD, 6 each at 05/16/22 0616   insulin aspart (novoLOG) injection 0-5 Units, 0-5 Units, Subcutaneous, QHS, Dallie Piles, RPH   insulin aspart (novoLOG) injection 0-9 Units, 0-9 Units, Subcutaneous, TID WC, Dallie Piles, RPH, 2 Units at 05/16/22 0815   lactulose (CHRONULAC) 10 GM/15ML solution 30 g, 30 g, Oral, Daily, Dallie Piles, RPH, 30 g at 05/16/22 0815   levothyroxine (SYNTHROID) tablet 100 mcg, 100 mcg, Oral, Q0600, Dallie Piles, RPH, 100 mcg at 05/16/22 0615   magnesium sulfate IVPB 2 g 50 mL, 2 g, Intravenous, Once, Dallie Piles, RPH, Last Rate: 50 mL/hr at 05/16/22 0817, 2 g at 05/16/22 0817   norepinephrine (LEVOPHED) 54m in 2570m(0.016 mg/mL) premix infusion, 0-40 mcg/min, Intravenous, Continuous, SiArta SilenceMD, Stopped at 05/14/22 1500   pantoprazole (PROTONIX) injection 40 mg, 40 mg, Intravenous, QHS, Waylyn Tenbrink, MD, 40 mg at 05/15/22 2130   polyethylene glycol (MIRALAX / GLYCOLAX) packet 17 g, 17 g, Oral, Daily PRN, AlOttie GlazierMD   rifaximin (XDoreene Nesttablet 550 mg, 550 mg, Oral, BID, GrDallie PilesRPH, 550 mg at 05/16/22 0816   traMADol (ULTRAM) tablet 50 mg, 50 mg, Oral, Q12H PRN, KeDarel Hong, NP, 50 mg at 05/16/22 0059   vancomycin (VANCOREADY) IVPB 2000 mg/400 mL, 2,000 mg, Intravenous, Q24H, [place], NaAlver SorrowRPH, Last Rate: 200 mL/hr at 05/15/22 1216, 2,000 mg at 05/15/22 1216    ALLERGIES    Lisinopril, Sulfamethoxazole, Camphor, Latex, and Nickel    REVIEW OF SYSTEMS     Unable to obtain due to altered mental status with lethargy  PHYSICAL EXAMINATION   Vital Signs: Temp:  [98.6 F (37 C)-98.8 F (37.1 C)] 98.8 F (37.1 C) (06/01 0700) Pulse Rate:  [47-82] 50 (06/01 0700) Resp:  [10-18] 13 (06/01 0700) BP: (93-133)/(44-70) 133/68 (  06/01 0700) SpO2:  [86 %-100 %] 93 % (06/01 0700) Weight:  [762 kg] 108 kg (06/01 0600)  GENERAL: Age-appropriate critically ill HEAD: Normocephalic, atraumatic.  EYES: Pupils equal, round, reactive to light.  No scleral icterus.  MOUTH: Moist mucosal membrane. NECK: Supple. No thyromegaly. No nodules. No JVD.  PULMONARY: Crackles at the bases bilaterally CARDIOVASCULAR: S1 and S2. Regular rate and rhythm. No murmurs, rubs, or gallops.  GASTROINTESTINAL: Soft, nontender, non-distended. No masses. Positive bowel sounds. No hepatosplenomegaly.  MUSCULOSKELETAL: No swelling, clubbing, or edema.  NEUROLOGIC: GCS 10 with lethargy SKIN:intact,warm,dry   PERTINENT DATA     Infusions:  magnesium sulfate bolus IVPB 2 g (05/16/22 0817)   norepinephrine (LEVOPHED) Adult infusion Stopped (05/14/22 1500)   vancomycin 2,000 mg (05/15/22 1216)   Scheduled Medications:  Chlorhexidine Gluconate Cloth  6 each Topical Q0600   insulin aspart  0-5 Units Subcutaneous QHS   insulin aspart  0-9 Units Subcutaneous TID WC   lactulose  30 g Oral Daily   levothyroxine  100 mcg Oral Q0600   pantoprazole (PROTONIX) IV  40 mg Intravenous QHS   rifaximin  550 mg Oral BID   PRN Medications: polyethylene glycol, traMADol Hemodynamic parameters:   Intake/Output: 05/31 0701 - 06/01 0700 In: 704.6 [P.O.:250; IV Piggyback:414.6] Out: 1700 [Urine:1700]  Ventilator  Settings:    LAB RESULTS:  Basic Metabolic Panel: Recent Labs  Lab 05/14/22 1104 05/14/22 1609 05/15/22 0323 05/16/22 0441  NA 136  --  136 136  K 4.0  --  4.0 3.7  CL 104   --  108 107  CO2 20*  --  23 24  GLUCOSE 185*  --  193* 167*  BUN 18  --  17 15  CREATININE 1.29*  --  1.26* 0.96  CALCIUM 8.8*  --  8.0* 8.2*  MG  --  1.7 1.5* 1.7  PHOS  --  2.8 3.3  --     Liver Function Tests: Recent Labs  Lab 05/14/22 1104 05/15/22 0323  AST 76* 92*  ALT 57* 55*  ALKPHOS 86 72  BILITOT 2.3* 1.9*  PROT 6.8 5.6*  ALBUMIN 3.3* 2.7*    No results for input(s): LIPASE, AMYLASE in the last 168 hours. Recent Labs  Lab 05/14/22 1104  AMMONIA 72*    CBC: Recent Labs  Lab 05/14/22 1104 05/15/22 0323 05/16/22 0441  WBC 4.2 3.8* 4.8  NEUTROABS 3.5  --   --   HGB 11.8* 10.1* 9.8*  HCT 34.1* 29.0* 28.6*  MCV 94.7 92.7 92.9  PLT 35* 26* 26*    Cardiac Enzymes: No results for input(s): CKTOTAL, CKMB, CKMBINDEX, TROPONINI in the last 168 hours. BNP: Invalid input(s): POCBNP CBG: Recent Labs  Lab 05/15/22 0339 05/15/22 1223 05/15/22 1715 05/15/22 2128 05/16/22 0735  GLUCAP 182* 239* 244* 143* 151*        IMAGING RESULTS:  Imaging: CT Head Wo Contrast  Result Date: 05/14/2022 CLINICAL DATA:  Altered mental status. EXAM: CT HEAD WITHOUT CONTRAST CT CERVICAL SPINE WITHOUT CONTRAST TECHNIQUE: Multidetector CT imaging of the head and cervical spine was performed following the standard protocol without intravenous contrast. Multiplanar CT image reconstructions of the cervical spine were also generated. RADIATION DOSE REDUCTION: This exam was performed according to the departmental dose-optimization program which includes automated exposure control, adjustment of the mA and/or kV according to patient size and/or use of iterative reconstruction technique. COMPARISON:  CT head and cervical spine dated January 22, 2019. FINDINGS: CT HEAD FINDINGS Brain: No  evidence of acute infarction, hemorrhage, hydrocephalus, extra-axial collection or mass lesion/mass effect. Stable mild atrophy and chronic microvascular ischemic changes. Vascular: No hyperdense vessel  or unexpected calcification. Skull: Normal. Negative for fracture or focal lesion. Sinuses/Orbits: No acute finding. Other: None. CT CERVICAL SPINE FINDINGS Alignment: No traumatic malalignment. Skull base and vertebrae: No acute fracture. No primary bone lesion or focal pathologic process. Soft tissues and spinal canal: No prevertebral fluid or swelling. No visible canal hematoma. Disc levels: Similar multilevel disc height loss and bulky endplate spurring. Advanced bilateral facet uncovertebral hypertrophy again noted, with interval facet joint ankylosis on the right C4-C5. Upper chest: Negative. Other: None. IMPRESSION: 1. No acute intracranial abnormality. 2. No acute cervical spine fracture or traumatic listhesis. Electronically Signed   By: Titus Dubin M.D.   On: 05/14/2022 12:25   CT Angio Chest Pulmonary Embolism (PE) W or WO Contrast  Result Date: 05/14/2022 CLINICAL DATA:  Pulmonary embolus suspected. EXAM: CT ANGIOGRAPHY CHEST WITH CONTRAST TECHNIQUE: Multidetector CT imaging of the chest was performed using the standard protocol during bolus administration of intravenous contrast. Multiplanar CT image reconstructions and MIPs were obtained to evaluate the vascular anatomy. RADIATION DOSE REDUCTION: This exam was performed according to the departmental dose-optimization program which includes automated exposure control, adjustment of the mA and/or kV according to patient size and/or use of iterative reconstruction technique. CONTRAST:  56m OMNIPAQUE IOHEXOL 350 MG/ML SOLN COMPARISON:  CT chest angio dated April 06, 2022 FINDINGS: Cardiovascular: Somewhat suboptimal contrast opacification of the pulmonary arteries. No evidence of pulmonary embolus. Limited evaluation of the distal lobar, segmental and subsegmental pulmonary arteries due to poor contrast opacification and severe streak artifact. Normal heart size. No pericardial effusion. Prior aortic valve replacement. Mild atherosclerotic disease  of the thoracic aorta. Moderate coronary artery calcifications of the LAD. Mitral annular calcifications. Mediastinum/Nodes: Esophagus and thyroid are unremarkable. Pathologically enlarged lymph nodes seen in the chest. Lungs/Pleura: Central airways are patent. Bibasilar atelectasis. No consolidation, pleural effusion or pneumothorax. Upper Abdomen: Cirrhotic liver morphology with tips in place. Splenomegaly. Diverticulosis. Musculoskeletal: No chest wall abnormality. No acute or significant osseous findings. Review of the MIP images confirms the above findings. IMPRESSION: 1. No evidence of central pulmonary embolus. Evaluation of the more distal pulmonary arteries is limited due to poor contrast opacification and severe streak artifact. 2. No acute airspace opacity. 3. Moderate coronary artery calcifications. 4. 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 5. Aortic Atherosclerosis (ICD10-I70.0). Electronically Signed   By: LYetta GlassmanM.D.   On: 05/14/2022 16:49   CT Cervical Spine Wo Contrast  Result Date: 05/14/2022 CLINICAL DATA:  Altered mental status. EXAM: CT HEAD WITHOUT CONTRAST CT CERVICAL SPINE WITHOUT CONTRAST TECHNIQUE: Multidetector CT imaging of the head and cervical spine was performed following the standard protocol without intravenous contrast. Multiplanar CT image reconstructions of the cervical spine were also generated. RADIATION DOSE REDUCTION: This exam was performed according to the departmental dose-optimization program which includes automated exposure control, adjustment of the mA and/or kV according to patient size and/or use of iterative reconstruction technique. COMPARISON:  CT head and cervical spine dated January 22, 2019. FINDINGS: CT HEAD FINDINGS Brain: No evidence of acute infarction, hemorrhage, hydrocephalus, extra-axial collection or mass lesion/mass effect. Stable mild atrophy and chronic microvascular ischemic changes.  Vascular: No hyperdense vessel or unexpected calcification. Skull: Normal. Negative for fracture or focal lesion. Sinuses/Orbits: No acute finding. Other: None. CT CERVICAL SPINE FINDINGS Alignment: No traumatic malalignment. Skull base and vertebrae: No  acute fracture. No primary bone lesion or focal pathologic process. Soft tissues and spinal canal: No prevertebral fluid or swelling. No visible canal hematoma. Disc levels: Similar multilevel disc height loss and bulky endplate spurring. Advanced bilateral facet uncovertebral hypertrophy again noted, with interval facet joint ankylosis on the right C4-C5. Upper chest: Negative. Other: None. IMPRESSION: 1. No acute intracranial abnormality. 2. No acute cervical spine fracture or traumatic listhesis. Electronically Signed   By: Titus Dubin M.D.   On: 05/14/2022 12:25   DG Chest Port 1 View  Result Date: 05/14/2022 CLINICAL DATA:  Questionable sepsis. EXAM: PORTABLE CHEST 1 VIEW COMPARISON:  Chest radiograph dated May 06, 2021 FINDINGS: Heart is normal in size. Prosthetic aortic valve is noted. Low lung volumes without evidence of focal consolidation or pleural effusion. Partially imaged left shoulder arthroplasty. Thoracic spondylosis. IMPRESSION: No acute cardiopulmonary disease. Electronically Signed   By: Keane Police D.O.   On: 05/14/2022 11:25   @PROBHOSP @ No results found.   ASSESSMENT AND PLAN    -Multidisciplinary rounds held today  Acute Circulatory shock -due to septic shock with staph bacteremia         - had TAVR may need TEE to r/o endocarditis per ID -Chest x-ray on admission without findings of pneumonia -Treating empirically with Rocephin and Zithromax -In lieu of thrombocytopenia and tachycardia with circulatory shock will obtain CT chest to rule out PE Blood cultures x2 ordered -MRSA PCR -Procalcitonin trend  Portal hypertensive gastropathy -Protonix IV 40 mg daily -Empirically on Rocephin for possible SBP -Clinically  does not have distended abdomen or fluid shift and abdominal exam   Renal Failure-acute stage II -Likely septic nephropathy -follow chem 7 -follow UO -continue Foley Catheter-assess need daily   NEUROLOGY - altered mental status with lethargy -Suspect septic encephalopathy Wake up assessment pending   Liver cirrhosis with coagulopathy -Complicated by thrombocytopenia -No DVT prophylaxis at this time -SCDs only   ID -continue IV abx as prescibed -follow up cultures  GI/Nutrition GI PROPHYLAXIS as indicated DIET-->TF's as tolerated Constipation protocol as indicated  ENDO - ICU hypoglycemic\Hyperglycemia protocol -check FSBS per protocol   ELECTROLYTES -follow labs as needed -replace as needed -pharmacy consultation   DVT/GI PRX ordered -SCDs  TRANSFUSIONS AS NEEDED MONITOR FSBS ASSESS the need for LABS as needed  Critical care provider statement:   Total critical care time: 33 minutes   Performed by: Lanney Gins MD   Critical care time was exclusive of separately billable procedures and treating other patients.   Critical care was necessary to treat or prevent imminent or life-threatening deterioration.   Critical care was time spent personally by me on the following activities: development of treatment plan with patient and/or surrogate as well as nursing, discussions with consultants, evaluation of patient's response to treatment, examination of patient, obtaining history from patient or surrogate, ordering and performing treatments and interventions, ordering and review of laboratory studies, ordering and review of radiographic studies, pulse oximetry and re-evaluation of patient's condition.    Ottie Glazier, M.D.  Pulmonary & Critical Care Medicine      This document was prepared using Dragon voice recognition software and may include unintentional dictation errors.    Ottie Glazier, M.D.  Division of [place]

## 2022-05-16 NOTE — Assessment & Plan Note (Signed)
-   Continue home Synthroid °

## 2022-05-16 NOTE — Evaluation (Addendum)
Physical Therapy Evaluation Patient Details Name: Joshua Berry MRN: [id number] DOB: 02-12-1946 Today's Date: 05/16/2022  History of Present Illness  Pt is a 76 y/o M who presented with c/o malaise & AMS. Vital signs showed shock physiology. PMH: DM, Liver cirrhosis, dCHF, essential HTN, dyslipidemia, arthritis, heart murmur, NASH, sleep apnea  Clinical Impression  Pt seen for PT evaluation with co-tx with OT. Pt reports prior to admission he resided with his wife (who primarily uses a w/c for mobility) & has a personal care aide 9am-1pm 5 days/week. Pt also reports he uses a rollator for mobility. On this date, pt is able to complete bed mobility with min assist with use of hospital bed features & CGA for STS & taking a few side steps towards HOB with RW. Further mobility is limited by low BP & pt c/o some lightheadedness that does improve with rest break. Pt would benefit from STR upon d/c to maximize independence with functional mobility & reduce fall risk prior to return home.    BP checked in LUE: Supine in bed: 99/49 mmHg MAP 64 Sitting EOB: 82/46 mmHg MAP 57 Sitting EOB after ~1-2 minutes: 91/49 mmHg MAP 63 Standing: 80/44 mmHg MAP 55 Supine in bed: 93/43 mmHg MAP 57, again 95/52 mmHg MAP 65  Addendum: MD cleared pt for participation in setting of low platelets.   Recommendations for follow up therapy are one component of a multi-disciplinary discharge planning process, led by the attending physician.  Recommendations may be updated based on patient status, additional functional criteria and insurance authorization.  Follow Up Recommendations Skilled nursing-short term rehab (<3 hours/day)    Assistance Recommended at Discharge Frequent or constant Supervision/Assistance  Patient can return home with the following  A little help with walking and/or transfers;A lot of help with bathing/dressing/bathroom;Assistance with cooking/housework;Direct supervision/assist for medications  management;Help with stairs or ramp for entrance    Equipment Recommendations Rolling walker (2 wheels)  Recommendations for Other Services       Functional Status Assessment Patient has had a recent decline in their functional status and demonstrates the ability to make significant improvements in function in a reasonable and predictable amount of time.     Precautions / Restrictions Precautions Precautions: Fall Restrictions Weight Bearing Restrictions: No      Mobility  Bed Mobility Overal bed mobility: Needs Assistance Bed Mobility: Supine to Sit, Sit to Supine     Supine to sit: Min assist, HOB elevated Sit to supine: Min assist, HOB elevated   General bed mobility comments: use of bed rails, extra time    Transfers Overall transfer level: Needs assistance Equipment used: Rolling walker (2 wheels) Transfers: Sit to/from Stand Sit to Stand: Min guard           General transfer comment: STS from elevated EOB with CGA    Ambulation/Gait Ambulation/Gait assistance:  (pt takes ~4 side steps to L at EOB with RW &CGA)                Stairs            Wheelchair Mobility    Modified Rankin (Stroke Patients Only)       Balance Overall balance assessment: Needs assistance Sitting-balance support: Feet supported, Bilateral upper extremity supported Sitting balance-Leahy Scale: Fair Sitting balance - Comments: supervision static sitting   Standing balance support: During functional activity, Bilateral upper extremity supported Standing balance-Leahy Scale: Fair  Pertinent Vitals/Pain Pain Assessment Pain Assessment: Faces Pain Score: 7  Pain Location: low back, R shoulder with flexion Pain Descriptors / Indicators: Grimacing, Discomfort Pain Intervention(s): Monitored during session, Repositioned (nurse aware)    Home Living Family/patient expects to be discharged to:: Private residence Living  Arrangements: Spouse/significant other;Other (Comment) Available Help at Discharge: Family;Personal care attendant Type of Home: House (condo) Home Access: Level entry       Home Layout: One level Home Equipment: Rollator (4 wheels);Cane - single point Additional Comments: lives with wife who primarily uses a w/c    Prior Function Prior Level of Function : Needs assist;History of Falls (last six months)             Mobility Comments: 1 fall in past 6 months, ambulatory with rollator ADLs Comments: personal care aide assists with bathing & meal prep     Hand Dominance        Extremity/Trunk Assessment   Upper Extremity Assessment Upper Extremity Assessment:  (RUE shoulder flexion limited (pt reports he was scheduled to receive cortisone shot prior to admission))    Lower Extremity Assessment Lower Extremity Assessment: Generalized weakness (3/5 knee extension BLE in sitting, 3-/5 hip flexion BLE in sitting)    Cervical / Trunk Assessment Cervical / Trunk Assessment:  (rounded shoulders, forward head)  Communication   Communication: No difficulties  Cognition Arousal/Alertness: Awake/alert Behavior During Therapy: WFL for tasks assessed/performed Overall Cognitive Status: Within Functional Limits for tasks assessed                                          General Comments      Exercises General Exercises - Lower Extremity Long Arc Quad: AROM, Strengthening, Both, 10 reps, Seated Hip Flexion/Marching: AROM, Strengthening, Both, 5 reps, Seated   Assessment/Plan    PT Assessment Patient needs continued PT services  PT Problem List Decreased strength;Cardiopulmonary status limiting activity;Decreased activity tolerance;Decreased balance;Decreased mobility       PT Treatment Interventions DME instruction;Therapeutic exercise;Gait training;Manual techniques;Stair training;Neuromuscular re-education;Modalities;Balance training;Functional mobility  training;Therapeutic activities;Patient/family education    PT Goals (Current goals can be found in the Care Plan section)  Acute Rehab PT Goals Patient Stated Goal: get better PT Goal Formulation: With patient Time For Goal Achievement: 05/30/22 Potential to Achieve Goals: Good    Frequency Min 2X/week     Co-evaluation PT/OT/SLP Co-Evaluation/Treatment: Yes Reason for Co-Treatment: To address functional/ADL transfers PT goals addressed during session: Mobility/safety with mobility;Balance         AM-PAC PT "6 Clicks" Mobility  Outcome Measure Help needed turning from your back to your side while in a flat bed without using bedrails?: A Little Help needed moving from lying on your back to sitting on the side of a flat bed without using bedrails?: A Lot Help needed moving to and from a bed to a chair (including a wheelchair)?: A Little Help needed standing up from a chair using your arms (e.g., wheelchair or bedside chair)?: A Little Help needed to walk in hospital room?: A Little Help needed climbing 3-5 steps with a railing? : A Lot 6 Click Score: 16    End of Session Equipment Utilized During Treatment: Oxygen Activity Tolerance:  (limited by low BP) Patient left: in bed;with bed alarm set (in chair position) Nurse Communication: Mobility status PT Visit Diagnosis: Unsteadiness on feet (R26.81);Muscle weakness (generalized) (M62.81);Difficulty in walking, not  elsewhere classified (R26.2)    Time: 1000-1020 PT Time Calculation (min) (ACUTE ONLY): 20 min   Charges:   PT Evaluation $PT Eval Moderate Complexity: [place], PT, DPT 05/16/22, 12:24 PM   Waunita Schooner 05/16/2022, 12:12 PM

## 2022-05-16 NOTE — Consult Note (Signed)
PHARMACY CONSULT NOTE  Pharmacy Consult for Electrolyte Monitoring and Replacement   Recent Labs: Potassium (mmol/L)  Date Value  05/16/2022 3.7  05/01/2014 4.2   Magnesium (mg/dL)  Date Value  05/16/2022 1.7   Calcium (mg/dL)  Date Value  05/16/2022 8.2 (L)   Calcium, Total (mg/dL)  Date Value  05/01/2014 9.4   Albumin (g/dL)  Date Value  05/15/2022 2.7 (L)  05/01/2014 4.2   Phosphorus (mg/dL)  Date Value  05/15/2022 3.3   Sodium (mmol/L)  Date Value  05/16/2022 136  05/01/2014 136   Corrected Ca: 9.0 mg/dL  Assessment: Patient is a 76 y/o M with medical history including HTN, HLD, DM, liver cirrhosis s/t NASH, s/p TIPS, GIB, portal hypertension c/b ascites, anemia, melanoma, s/p TAVR, diastolic CHF, OSA who presented to the ED 5/30 with AMS x 2-3 days. Patient subsequently found to be in shock. Disposition is the ICU. Pharmacy consulted to assist with electrolyte monitoring and replacement as indicated.  Goal of Therapy:  Electrolytes within normal limits  Plan:  2 grams IV magnesium sulfate x 1 Because this consult was generated as part of an ICU order set and the patient is transferring pharmacy will sign off of electrolyte replacement for now  Dallie Piles 05/16/2022 7:01 AM

## 2022-05-16 NOTE — Assessment & Plan Note (Addendum)
Looks compensated.  Mild LFT elevation with mild hyperbilirubinemia likely from sepsis.  Ammonia 53>> 71> 26 but he seems to be sleepier this morning.  Has not a bowel movement yet. -Continue rifaximin -Continue lactulose to 30 g 3 times daily. -Discontinue tramadol and oxycodone -Monitor LFT and ammonia intermittently -Consider Lasix and Aldactone prior to discharge

## 2022-05-16 NOTE — Assessment & Plan Note (Addendum)
Resolved

## 2022-05-16 NOTE — Assessment & Plan Note (Addendum)
Chronic.  Unchanged per patient.  Seems to have improved. -Tylenol as needed -Discontinue tramadol and oxycodone.  Seems to be sleepy at this morning

## 2022-05-16 NOTE — Assessment & Plan Note (Signed)
Body mass index is 30.57 kg/m.

## 2022-05-16 NOTE — Progress Notes (Signed)
ID Out of ICU In no distress Some confusion Says he is feeling okay  O/e awake and alert Slow speech Chest b/l air entry [id number] Abd soft CNS non focal   Labs    Latest Ref Rng & Units 05/16/2022    4:41 AM 05/15/2022    3:23 AM 05/14/2022   11:04 AM  CBC  WBC 4.0 - 10.5 K/uL 4.8   3.8   4.2    Hemoglobin 13.0 - 17.0 g/dL 9.8   10.1   11.8    Hematocrit 39.0 - 52.0 % 28.6   29.0   34.1    Platelets 150 - 400 K/uL 26   26   35          Latest Ref Rng & Units 05/16/2022    4:41 AM 05/15/2022    3:23 AM 05/14/2022   11:04 AM  CMP  Glucose 70 - 99 mg/dL 167   193   185    BUN 8 - 23 mg/dL 15   17   18     Creatinine 0.61 - 1.24 mg/dL 0.96   1.26   1.29    Sodium 135 - 145 mmol/L 136   136   136    Potassium 3.5 - 5.1 mmol/L 3.7   4.0   4.0    Chloride 98 - 111 mmol/L 107   108   104    CO2 22 - 32 mmol/L 24   23   20     Calcium 8.9 - 10.3 mg/dL 8.2   8.0   8.8    Total Protein 6.5 - 8.1 g/dL 5.5   5.6   6.8    Total Bilirubin 0.3 - 1.2 mg/dL 1.8   1.9   2.3    Alkaline Phos 38 - 126 U/L 63   72   86    AST 15 - 41 U/L 80   92   76    ALT 0 - 44 U/L 47   55   57      Micro 05/14/22 Gram positive cocci- staphylococcus species  05/16/22 Culture pending  Impression/recommendation 76 year old male with history of TAVR, cirrhosis, diabetes mellitus, severe thrombocytopenia, presented with fall confusion fever  Sepsis  Coag negative staph bacteremia 4 out of 4 Patient has TAVR and hence concern for endocarditis Currently on vancomycin 2D echo has been done but results still pending May need TEE Recommend MRI of the lumbar spine Repeat blood culture has been sent  Poor dentition Waiting for extraction of all teeth  Confusion-- hepatic encephalopathy  AKI improving  Diabetes mellitus on insulin  #With cirrhosis decompensated History of ascites Has TIPS  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

## 2022-05-16 NOTE — Evaluation (Signed)
Occupational Therapy Evaluation Patient Details Name: Joshua Berry MRN: [id number] DOB: 06-20-1946 Today's Date: 05/16/2022   History of Present Illness Pt is a 76 y/o M who presented with c/o malaise & AMS. Vital signs showed shock physiology. PMH: DM, Liver cirrhosis, dCHF, essential HTN, dyslipidemia, arthritis, heart murmur, NASH, sleep apnea   Clinical Impression   Mr Rinn was seen for OT/PT co-evaluation this date. Prior to hospital admission, pt was MOD I using 4WW, reports aid for IADLs. Pt lives with spouse who is w/c level. Pt presents to acute OT demonstrating impaired ADL performance and functional mobility 2/2 decreased activity tolerance and functional strength/ROM/balance deficits. Pt currently requires  MAX A don B socks seated EOB - requires BUE support on bed for dynamic sitting balance. CGA + RW sit<>stand and ~4 side steps - limited by dizziness/BP. Pt reports 7/10 back pain, RN notified. Pt would benefit from skilled OT to address noted impairments and functional limitations (see below for any additional details). Upon hospital discharge, recommend STR to maximize pt safety and return to PLOF.  BP checked in LUE: Supine in bed: 99/49 mmHg MAP 64 Sitting EOB: 82/46 mmHg MAP 57 Sitting EOB after ~1-2 minutes: 91/49 mmHg MAP 63 Standing: 80/44 mmHg MAP 55 Supine in bed: 93/43 mmHg MAP 57, again 95/52 mmHg MAP 65      Recommendations for follow up therapy are one component of a multi-disciplinary discharge planning process, led by the attending physician.  Recommendations may be updated based on patient status, additional functional criteria and insurance authorization.   Follow Up Recommendations  Skilled nursing-short term rehab (<3 hours/day)    Assistance Recommended at Discharge Frequent or constant Supervision/Assistance  Patient can return home with the following A little help with walking and/or transfers;A little help with bathing/dressing/bathroom;Help with stairs  or ramp for entrance    Functional Status Assessment  Patient has had a recent decline in their functional status and demonstrates the ability to make significant improvements in function in a reasonable and predictable amount of time.  Equipment Recommendations  Other (comment) (defer)    Recommendations for Other Services       Precautions / Restrictions Precautions Precautions: Fall Restrictions Weight Bearing Restrictions: No      Mobility Bed Mobility Overal bed mobility: Needs Assistance Bed Mobility: Supine to Sit, Sit to Supine     Supine to sit: Min assist, HOB elevated Sit to supine: Min assist        Transfers Overall transfer level: Needs assistance Equipment used: Rolling walker (2 wheels) Transfers: Sit to/from Stand Sit to Stand: Min guard                  Balance Overall balance assessment: Needs assistance Sitting-balance support: Feet supported, Bilateral upper extremity supported Sitting balance-Leahy Scale: Fair     Standing balance support: During functional activity, Bilateral upper extremity supported Standing balance-Leahy Scale: Fair                             ADL either performed or assessed with clinical judgement   ADL Overall ADL's : Needs assistance/impaired                                       General ADL Comments: MAX A don B socks seated EOB - requires BUE support on bed for dynamic sitting  balance. CGA + RW for simulated BSC t/f - limited by dizziness/BP.      Pertinent Vitals/Pain Pain Assessment Pain Assessment: 0-10 Pain Score: 7  Pain Location: low back, R shoulder with flexion Pain Descriptors / Indicators: Grimacing, Discomfort Pain Intervention(s): Limited activity within patient's tolerance, Repositioned, Patient requesting pain meds-RN notified     Hand Dominance     Extremity/Trunk Assessment Upper Extremity Assessment Upper Extremity Assessment: Generalized weakness  (RUE limited shoulder flexion - reports pain from prior to admission)   Lower Extremity Assessment Lower Extremity Assessment: Generalized weakness   Cervical / Trunk Assessment Cervical / Trunk Assessment:  (rounded shoulders, forward head)   Communication Communication Communication: No difficulties   Cognition Arousal/Alertness: Awake/alert Behavior During Therapy: WFL for tasks assessed/performed Overall Cognitive Status: Within Functional Limits for tasks assessed                                                  Home Living Family/patient expects to be discharged to:: Private residence Living Arrangements: Spouse/significant other;Other (Comment) Available Help at Discharge: Family;Personal care attendant Type of Home: House (condo) Home Access: Level entry     Home Layout: One level     Bathroom Shower/Tub: Chief Strategy Officer: Rollator (4 wheels);Cane - single point   Additional Comments: lives with wife who primarily uses a w/c      Prior Functioning/Environment Prior Level of Function : Needs assist;History of Falls (last six months)             Mobility Comments: 1 fall in past 6 months, ambulatory with rollator ADLs Comments: personal care aide assists with bathing & meal prep        OT Problem List: Decreased strength;Decreased range of motion;Decreased activity tolerance;Impaired balance (sitting and/or standing);Decreased safety awareness      OT Treatment/Interventions: Self-care/ADL training;Therapeutic exercise;Energy conservation;DME and/or AE instruction;Therapeutic activities;Patient/family education;Balance training    OT Goals(Current goals can be found in the care plan section) Acute Rehab OT Goals Patient Stated Goal: to go home OT Goal Formulation: With patient Time For Goal Achievement: 05/30/22 Potential to Achieve Goals: Good ADL Goals Pt Will Perform Grooming: with modified  independence;standing Pt Will Perform Lower Body Dressing: sit to/from stand;with modified independence Pt Will Transfer to Toilet: with modified independence;ambulating;regular height toilet  OT Frequency: Min 2X/week    Co-evaluation PT/OT/SLP Co-Evaluation/Treatment: Yes Reason for Co-Treatment: For patient/therapist safety PT goals addressed during session: Mobility/safety with mobility OT goals addressed during session: ADL's and self-care      AM-PAC OT "6 Clicks" Daily Activity     Outcome Measure Help from another person eating meals?: None Help from another person taking care of personal grooming?: A Little Help from another person toileting, which includes using toliet, bedpan, or urinal?: A Lot Help from another person bathing (including washing, rinsing, drying)?: A Lot Help from another person to put on and taking off regular upper body clothing?: A Little Help from another person to put on and taking off regular lower body clothing?: A Lot 6 Click Score: 16   End of Session Equipment Utilized During Treatment: Rolling walker (2 wheels) Nurse Communication: Mobility status  Activity Tolerance: Patient tolerated treatment well Patient left: in bed;with call bell/phone within reach;with nursing/sitter in room  OT Visit Diagnosis: Other abnormalities of gait  and mobility (R26.89);Muscle weakness (generalized) (M62.81)                Time: 3545-6256 OT Time Calculation (min): 23 min Charges:  OT General Charges $OT Visit: 1 Visit OT Evaluation $OT Eval Moderate Complexity: 1 Mod  Dessie Coma, M.S. OTR/L  05/16/22, 2:23 PM  ascom [phone]

## 2022-05-16 NOTE — Assessment & Plan Note (Signed)
Reportedly slipped and fell at home.  No notable injury.  CT head and cervical spine without significant finding. -Fall precaution -PT/OT

## 2022-05-16 NOTE — Assessment & Plan Note (Signed)
Suspect odontogenic source given poor dentition.  Blood culture with Staphylococcus Caprae.  MRSA PCR screen negative.  Sepsis physiology resolved.  No significant finding on TTE.  Repeat blood cultures NGTD.  MRI lumbar spine with DDD but no suspicion for discitis osteomyelitis. -Infectious disease following. -Vancomycin 5/30>> IV Ancef 6/2>>> -CTX and Zithromax 5/30-5/31. -ID recommends TEE.  Cardiology consulted. -Dental extraction outpatient

## 2022-05-16 NOTE — Assessment & Plan Note (Signed)
TTE in 2020 with LVEF of 55 to 65%.  Seems to be on p.o. Lasix 20 mg daily at home.  Appears euvolemic on exam. -Follow TTE -Hold diuretics given soft blood pressure -Monitor I and O's, daily weight, renal functions and electrolytes

## 2022-05-16 NOTE — Assessment & Plan Note (Addendum)
Recent Labs    04/06/22 1122 04/07/22 0557 05/14/22 1104 05/15/22 0323 05/16/22 0441 05/17/22 0550 05/18/22 0544  BUN 15 15 18 17 15 11 10   CREATININE 1.15 1.11 1.29* 1.26* 0.96 0.80 0.74  Likely due to sepsis.  Resolved. -Avoid or minimize nephrotoxins

## 2022-05-16 NOTE — Assessment & Plan Note (Addendum)
Likely due to liver cirrhosis.  Leukopenia resolved.  Thrombocytopenia improving.  H&H stable. -Continue monitoring

## 2022-05-16 NOTE — Progress Notes (Signed)
PROGRESS NOTE  Joshua Berry KGY:[id number] DOB: May 27, 1946   PCP: Lorelei Pont, MD  Patient is from: Home.  Lives with wife.  Reports using walker and cane at baseline.  DOA: 05/14/2022 LOS: 2  Chief complaints Chief Complaint  Patient presents with   Altered Mental Status     Brief Narrative / Interim history: 76 year old M with PMH of liver cirrhosis, portal HTN, GIB/GAVE, diastolic CHF, pancytopenia, OSA, TAVR, poor dentition and chronic right shoulder pain presenting with malaise and altered mental status and admitted to ICU with septic shock due to coagulase-negative staph bacteremia, and AKI requiring vasopressors and IV antibiotics.  BC ID with Staphylococcus species but not aureus or MRSA.  MRSA PCR screen negative.  ID consulted and recommended TTE +/-TEE to rule out endocarditis.  Patient was weaned off vasopressor and transferred to [facility] service on 05/16/2022.   Subjective: Seen and examined earlier this morning.  No major events overnight or this morning.  He complains of right shoulder pain.  This is chronic pain for about a year.  Unchanged.  He rates his pain 7/10.  Denies prior history of shoulder surgery.   Objective: Vitals:   05/16/22 1100 05/16/22 1200 05/16/22 1300 05/16/22 1422  BP: (!) 120/57 (!) 120/56 134/63 (!) 110/52  Pulse: 73 (!) 59 68 63  Resp: 19 13 20 18   Temp:   98.8 F (37.1 C) 98.4 F (36.9 C)  TempSrc:   Oral Oral  SpO2: 97% 95% 92% 97%  Weight:      Height:        Examination:  GENERAL: No apparent distress.  Nontoxic. HEENT: MMM.  Vision and hearing grossly intact.  NECK: Supple.  No apparent JVD.  RESP:  No IWOB.  Fair aeration bilaterally. CVS:  RRR. Heart sounds normal.  ABD/GI/GU: BS+. Abd soft, NTND.  MSK/EXT:  Moves extremities.  Fair range of motion in right shoulder.  No focal tenderness.  No erythema or swelling. SKIN: no apparent skin lesion or wound NEURO: Awake but not quite alert.  Oriented x4 except  date and month.  No apparent focal neuro deficit. PSYCH: Calm. Normal affect.   Procedures:  None  Microbiology summarized: 5/30-COVID-19 and influenza PCR nonreactive. 5/30-MRSA PCR screen negative. 5/30-blood culture with GPC in 4/4.  BC ID with staph species but not aureus MRSA.  Assessment and Plan: * Septic shock due to Staphylococcus bacteremia Suspect odontogenic source given poor dentition.  BC ID with Staphylococcus species but not aureus or MRSA.  MRSA PCR screen negative.  Sepsis physiology resolved.   -Infectious disease following. -Continue IV vancomycin -Follow repeat blood culture and TTE.  May need TEE  AKI (acute kidney injury) ([place]) Recent Labs    04/06/22 1122 04/07/22 0557 05/14/22 1104 05/15/22 0323 05/16/22 0441  BUN 15 15 18 17 15   CREATININE 1.15 1.11 1.29* 1.26* 0.96  Likely due to sepsis.  Resolved. -Avoid or minimize nephrotoxins   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 Looks compensated.  Mild LFT elevation with mild hyperbilirubinemia likely from sepsis -May benefit from Lasix and Aldactone once blood pressure improves. -Continue rifaximin and lactulose -Check LFT and ammonia in the morning  Hypotension Initially due to septic shock.  He came off vasopressor.  Soft blood pressure earlier this morning. -Start midodrine  Chronic diastolic CHF (congestive heart failure) (HCC) TTE in 2020 with LVEF of 55 to 65%.  Seems to be on p.o. Lasix 20 mg daily at home.  Appears euvolemic on exam. -  Follow TTE -Hold diuretics given soft blood pressure -Monitor I and O's, daily weight, renal functions and electrolytes   Controlled IDDM-2 with hyperglycemia A1c 6.1% about 1 month ago.  Uses Lantus 16 units daily and NovoLog 10 units 3 times daily Recent Labs  Lab 05/15/22 1223 05/15/22 1715 05/15/22 2128 05/16/22 0735 05/16/22 1109  GLUCAP 239* 244* 143* 151* 278*  -Increase SSI to moderate -Add Semglee 10 units  daily and adjust as appropriate -Hold home statin   Obesity (BMI 30-39.9) Body mass index is 30.57 kg/m.   Physical deconditioning PT/OT  Hypothyroidism Continue home Synthroid.  Right shoulder pain Chronic.  Unchanged per patient. -Tylenol, oxycodone and fentanyl as needed  Pancytopenia (HCC) Likely due to liver cirrhosis.  Leukopenia improved.  Anemia and thrombocytopenia seems to be stable. -Continue monitoring    DVT prophylaxis:  SCDs Start: 05/14/22 1608  Code Status: Full code Family Communication: Updated his daughter over the phone Level of care: Med-Surg Status is: Inpatient Remains inpatient appropriate because: Staphylococcus bacteremia and further evaluation to rule out endocarditis   Final disposition: TBD Consultants:  Pulmonology admitted patient Infectious disease  Sch Meds:  Scheduled Meds:  Chlorhexidine Gluconate Cloth  6 each Topical Q0600   insulin aspart  0-15 Units Subcutaneous TID WC   insulin aspart  0-5 Units Subcutaneous QHS   insulin glargine-yfgn  10 Units Subcutaneous QHS   lactulose  30 g Oral Daily   levothyroxine  100 mcg Oral Q0600   lidocaine  1 patch Transdermal Q24H   midodrine  5 mg Oral TID WC   pantoprazole  40 mg Oral Daily   rifaximin  550 mg Oral BID   Continuous Infusions:  vancomycin 2,000 mg (05/16/22 1118)   PRN Meds:.acetaminophen, fentaNYL (SUBLIMAZE) injection, oxyCODONE, polyethylene glycol, traMADol  Antimicrobials: Anti-infectives (From admission, onward)    Start     Dose/Rate Route Frequency Ordered Stop   05/15/22 1400  rifaximin (XIFAXAN) tablet 550 mg        550 mg Oral 2 times daily 05/15/22 1229     05/15/22 1000  vancomycin (VANCOREADY) IVPB 2000 mg/400 mL        2,000 mg 200 mL/hr over 120 Minutes Intravenous Every 24 hours 05/15/22 0306     05/14/22 2000  cefTRIAXone (ROCEPHIN) 2 g in sodium chloride 0.9 % 100 mL IVPB  Status:  Discontinued        2 g 200 mL/hr over 30 Minutes  Intravenous Every 24 hours 05/14/22 1617 05/15/22 0257   05/14/22 1800  azithromycin (ZITHROMAX) 500 mg in sodium chloride 0.9 % 250 mL IVPB  Status:  Discontinued        500 mg 250 mL/hr over 60 Minutes Intravenous Every 24 hours 05/14/22 1617 05/15/22 1512   05/14/22 1330  vancomycin (VANCOREADY) IVPB 1250 mg/250 mL        1,250 mg 166.7 mL/hr over 90 Minutes Intravenous  Once 05/14/22 1220 05/14/22 1435   05/14/22 1215  ceFEPIme (MAXIPIME) 2 g in sodium chloride 0.9 % 100 mL IVPB        2 g 200 mL/hr over 30 Minutes Intravenous  Once 05/14/22 1202 05/14/22 1300   05/14/22 1215  vancomycin (VANCOCIN) IVPB 1000 mg/200 mL premix        1,000 mg 200 mL/hr over 60 Minutes Intravenous  Once 05/14/22 1202 05/14/22 1331        I have personally reviewed the following labs and images: CBC: Recent Labs  Lab 05/14/22 1104 05/15/22  8299 05/16/22 0441  WBC 4.2 3.8* 4.8  NEUTROABS 3.5  --   --   HGB 11.8* 10.1* 9.8*  HCT 34.1* 29.0* 28.6*  MCV 94.7 92.7 92.9  PLT 35* 26* 26*   BMP &GFR Recent Labs  Lab 05/14/22 1104 05/14/22 1609 05/15/22 0323 05/16/22 0441  NA 136  --  136 136  K 4.0  --  4.0 3.7  CL 104  --  108 107  CO2 20*  --  23 24  GLUCOSE 185*  --  193* 167*  BUN 18  --  17 15  CREATININE 1.29*  --  1.26* 0.96  CALCIUM 8.8*  --  8.0* 8.2*  MG  --  1.7 1.5* 1.7  PHOS  --  2.8 3.3  --    Estimated Creatinine Clearance: 84.3 mL/min (by C-G formula based on SCr of 0.96 mg/dL). Liver & Pancreas: Recent Labs  Lab 05/14/22 1104 05/15/22 0323 05/16/22 0441  AST 76* 92* 80*  ALT 57* 55* 47*  ALKPHOS 86 72 63  BILITOT 2.3* 1.9* 1.8*  PROT 6.8 5.6* 5.5*  ALBUMIN 3.3* 2.7* 2.6*   No results for input(s): LIPASE, AMYLASE in the last 168 hours. Recent Labs  Lab 05/14/22 1104  AMMONIA 72*   Diabetic: No results for input(s): HGBA1C in the last 72 hours. Recent Labs  Lab 05/15/22 1223 05/15/22 1715 05/15/22 2128 05/16/22 0735 05/16/22 1109  GLUCAP 239* 244*  143* 151* 278*   Cardiac Enzymes: No results for input(s): CKTOTAL, CKMB, CKMBINDEX, TROPONINI in the last 168 hours. No results for input(s): PROBNP in the last 8760 hours. Coagulation Profile: No results for input(s): INR, PROTIME in the last 168 hours. Thyroid Function Tests: No results for input(s): TSH, T4TOTAL, FREET4, T3FREE, THYROIDAB in the last 72 hours. Lipid Profile: No results for input(s): CHOL, HDL, LDLCALC, TRIG, CHOLHDL, LDLDIRECT in the last 72 hours. Anemia Panel: No results for input(s): VITAMINB12, FOLATE, FERRITIN, TIBC, IRON, RETICCTPCT in the last 72 hours. Urine analysis:    Component Value Date/Time   COLORURINE YELLOW (A) 05/14/2022 1104   APPEARANCEUR CLEAR (A) 05/14/2022 1104   APPEARANCEUR Hazy 05/01/2014 1653   LABSPEC 1.021 05/14/2022 1104   LABSPEC 1.015 05/01/2014 1653   PHURINE 7.0 05/14/2022 1104   GLUCOSEU NEGATIVE 05/14/2022 1104   GLUCOSEU Negative 05/01/2014 [place] 05/14/2022 [place] 05/14/2022 1104   BILIRUBINUR Negative 05/01/2014 [place] 05/14/2022 [place] 05/14/2022 1104   NITRITE NEGATIVE 05/14/2022 [place] 05/14/2022 1104   LEUKOCYTESUR Negative 05/01/2014 1653   Sepsis Labs: Invalid input(s): PROCALCITONIN, [place]  Microbiology: Recent Results (from the past 240 hour(s))  Blood Culture (routine x 2)     Status: None (Preliminary result)   Collection Time: 05/14/22 11:06 AM   Specimen: BLOOD  Result Value Ref Range Status   Specimen Description   Final    BLOOD BLOOD RIGHT FOREARM Performed at [facility], [address], [place], [place] [address]    Special Requests   Final    BOTTLES DRAWN AEROBIC AND ANAEROBIC Blood Culture adequate volume Performed at [facility], [address] [place], [place] [address]    Culture  Setup Time   Final    Organism ID to follow GRAM POSITIVE COCCI IN BOTH AEROBIC  AND ANAEROBIC BOTTLES CRITICAL RESULT CALLED TO, READ BACK BY AND VERIFIED WITH: NATHAN BELUTH @ 8101 ON 05/15/2022.Marland KitchenMarland KitchenTKR  Culture GRAM POSITIVE COCCI  Final   Report Status PENDING  Incomplete  Blood Culture ID Panel (Reflexed)     Status: Abnormal   Collection Time: 05/14/22 11:06 AM  Result Value Ref Range Status   Enterococcus faecalis NOT DETECTED NOT DETECTED Final   Enterococcus Faecium NOT DETECTED NOT DETECTED Final   Listeria monocytogenes NOT DETECTED NOT DETECTED Final   Staphylococcus species DETECTED (A) NOT DETECTED Final    Comment: NATHAN BELUTH @ 2841 ON 05/15/2022.Marland KitchenMarland KitchenTKR   Staphylococcus aureus (BCID) NOT DETECTED NOT DETECTED Final   Staphylococcus epidermidis NOT DETECTED NOT DETECTED Final   Staphylococcus lugdunensis NOT DETECTED NOT DETECTED Final   Streptococcus species NOT DETECTED NOT DETECTED Final   Streptococcus agalactiae NOT DETECTED NOT DETECTED Final   Streptococcus pneumoniae NOT DETECTED NOT DETECTED Final   Streptococcus pyogenes NOT DETECTED NOT DETECTED Final   A.calcoaceticus-baumannii NOT DETECTED NOT DETECTED Final   Bacteroides fragilis NOT DETECTED NOT DETECTED Final   Enterobacterales NOT DETECTED NOT DETECTED Final   Enterobacter cloacae complex NOT DETECTED NOT DETECTED Final   Escherichia coli NOT DETECTED NOT DETECTED Final   Klebsiella aerogenes NOT DETECTED NOT DETECTED Final   Klebsiella oxytoca NOT DETECTED NOT DETECTED Final   Klebsiella pneumoniae NOT DETECTED NOT DETECTED Final   Proteus species NOT DETECTED NOT DETECTED Final   Salmonella species NOT DETECTED NOT DETECTED Final   Serratia marcescens NOT DETECTED NOT DETECTED Final   Haemophilus influenzae NOT DETECTED NOT DETECTED Final   Neisseria meningitidis NOT DETECTED NOT DETECTED Final   Pseudomonas aeruginosa NOT DETECTED NOT DETECTED Final   Stenotrophomonas maltophilia NOT DETECTED NOT DETECTED Final   Candida albicans NOT DETECTED NOT DETECTED Final   Candida auris  NOT DETECTED NOT DETECTED Final   Candida glabrata NOT DETECTED NOT DETECTED Final   Candida krusei NOT DETECTED NOT DETECTED Final   Candida parapsilosis NOT DETECTED NOT DETECTED Final   Candida tropicalis NOT DETECTED NOT DETECTED Final   Cryptococcus neoformans/gattii NOT DETECTED NOT DETECTED Final    Comment: Performed at [facility], [address] [place], [place] [address]  Blood Culture (routine x 2)     Status: None (Preliminary result)   Collection Time: 05/14/22 11:22 AM   Specimen: BLOOD  Result Value Ref Range Status   Specimen Description   Final    BLOOD RIGHT ARM Performed at [facility], [address] [place], [place] [address]    Special Requests   Final    BOTTLES DRAWN AEROBIC AND ANAEROBIC Blood Culture results may not be optimal due to an excessive volume of blood received in culture bottles Performed at [facility], [address] [place],  [address]    Culture  Setup Time   Final    GRAM POSITIVE COCCI IN BOTH AEROBIC AND ANAEROBIC BOTTLES CRITICAL VALUE NOTED.  VALUE IS CONSISTENT WITH PREVIOUSLY REPORTED AND CALLED VALUE.    Culture GRAM POSITIVE COCCI  Final   Report Status PENDING  Incomplete  Resp Panel by RT-PCR (Flu A&B, Covid) Anterior Nasal Swab     Status: None   Collection Time: 05/14/22 12:04 PM   Specimen: Anterior Nasal Swab  Result Value Ref Range Status   SARS Coronavirus 2 by RT PCR NEGATIVE NEGATIVE Final    Comment: (NOTE) SARS-CoV-2 target nucleic acids are NOT DETECTED.  The SARS-CoV-2 RNA is generally detectable in upper respiratory specimens during the acute phase of infection. The lowest concentration of SARS-CoV-2 viral copies this assay  can detect is 138 copies/mL. A negative result does not preclude SARS-Cov-2 infection and should not be used as the sole basis for treatment or other patient management decisions. A negative result may occur with  improper specimen  collection/handling, submission of specimen other than nasopharyngeal swab, presence of viral mutation(s) within the areas targeted by this assay, and inadequate number of viral copies(<138 copies/mL). A negative result must be combined with clinical observations, patient history, and epidemiological information. The expected result is Negative.  Fact Sheet for Patients:  EntrepreneurPulse.com.au  Fact Sheet for Healthcare Providers:  IncredibleEmployment.be  This test is no t yet approved or cleared by the [place] FDA and  has been authorized for detection and/or diagnosis of SARS-CoV-2 by FDA under an Emergency Use Authorization (EUA). This EUA will remain  in effect (meaning this test can be used) for the duration of the COVID-19 declaration under Section 564(b)(1) of the Act, 21 U.S.C.section 360bbb-3(b)(1), unless the authorization is terminated  or revoked sooner.       Influenza A by PCR NEGATIVE NEGATIVE Final   Influenza B by PCR NEGATIVE NEGATIVE Final    Comment: (NOTE) The Xpert Xpress SARS-CoV-2/FLU/RSV plus assay is intended as an aid in the diagnosis of influenza from Nasopharyngeal swab specimens and should not be used as a sole basis for treatment. Nasal washings and aspirates are unacceptable for Xpert Xpress SARS-CoV-2/FLU/RSV testing.  Fact Sheet for Patients: EntrepreneurPulse.com.au  Fact Sheet for Healthcare Providers: IncredibleEmployment.be  This test is not yet approved or cleared by the [place] FDA and has been authorized for detection and/or diagnosis of SARS-CoV-2 by FDA under an Emergency Use Authorization (EUA). This EUA will remain in effect (meaning this test can be used) for the duration of the COVID-19 declaration under Section 564(b)(1) of the Act, 21 U.S.C. section 360bbb-3(b)(1), unless the authorization is terminated or revoked.  Performed at [facility], [address] [place], [place] [address]   MRSA Next Gen by PCR, Nasal     Status: None   Collection Time: 05/14/22  9:29 PM   Specimen: Nasal Mucosa; Nasal Swab  Result Value Ref Range Status   MRSA by PCR Next Gen NOT DETECTED NOT DETECTED Final    Comment: (NOTE) The GeneXpert MRSA Assay (FDA approved for NASAL specimens only), is one component of a comprehensive MRSA colonization surveillance program. It is not intended to diagnose MRSA infection nor to guide or monitor treatment for MRSA infections. Test performance is not FDA approved in patients less than 34 years old. Performed at [facility], [address] [place], [place] [address]     Radiology Studies: No results found.    Sreekar Broyhill T. [place]  If 7PM-7AM, please contact night-coverage [email or web address] 05/16/2022, 4:04 PM

## 2022-05-16 NOTE — Assessment & Plan Note (Addendum)
A1c 6.1% about 1 month ago.  Uses Lantus 16 units daily and NovoLog 10 units 3 times daily Recent Labs  Lab 05/18/22 1145 05/18/22 1621 05/19/22 0725 05/19/22 1046 05/19/22 1138  GLUCAP 262* 251* 167* 144* 146*  -Continue SSI-moderate -Continue Semglee 20 units at night -Continue NovoLog 5 units 3 times daily with meals -Further adjustment as appropriate.

## 2022-05-16 NOTE — Hospital Course (Signed)
76 year old M with PMH of liver cirrhosis, portal HTN, GIB/GAVE, diastolic CHF, pancytopenia, OSA, TAVR, poor dentition and chronic right shoulder pain presenting with malaise and altered mental status and admitted to ICU with septic shock due to coagulase-negative staph bacteremia, and AKI requiring vasopressors and IV antibiotics.  BC ID with Staphylococcus species but not aureus or MRSA.  MRSA PCR screen negative.  ID consulted.   Patient was weaned off vasopressor and transferred to [facility] hospitalist service on 05/16/2022. Repeat blood culture NGTD.  TTE without significant finding or vegetation.  MRI lumbar spine with significant DDD but no discitis or osteomyelitis.  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  ID recommends TEE.  Cardiology consulted.

## 2022-05-16 NOTE — Assessment & Plan Note (Signed)
PT/OT

## 2022-05-17 ENCOUNTER — Inpatient Hospital Stay: Payer: No Typology Code available for payment source

## 2022-05-17 ENCOUNTER — Inpatient Hospital Stay
Admit: 2022-05-17 | Discharge: 2022-05-17 | Disposition: A | Discharge status: Home / Self Care | Payer: No Typology Code available for payment source | Attending: Infectious Diseases | Admitting: Infectious Diseases

## 2022-05-17 ENCOUNTER — Inpatient Hospital Stay (HOSPITAL_COMMUNITY)
Admit: 2022-05-17 | Discharge: 2022-05-17 | Disposition: A | Discharge status: Home / Self Care | Payer: No Typology Code available for payment source | Attending: Infectious Diseases | Admitting: Infectious Diseases

## 2022-05-17 DIAGNOSIS — Y92009 Unspecified place in unspecified non-institutional (private) residence as the place of occurrence of the external cause: Secondary | ICD-10-CM

## 2022-05-17 DIAGNOSIS — K7460 Unspecified cirrhosis of liver: Secondary | ICD-10-CM | POA: Diagnosis not present

## 2022-05-17 DIAGNOSIS — N179 Acute kidney failure, unspecified: Secondary | ICD-10-CM | POA: Diagnosis not present

## 2022-05-17 DIAGNOSIS — Z952 Presence of prosthetic heart valve: Secondary | ICD-10-CM | POA: Diagnosis not present

## 2022-05-17 DIAGNOSIS — R7881 Bacteremia: Secondary | ICD-10-CM | POA: Diagnosis not present

## 2022-05-17 DIAGNOSIS — M549 Dorsalgia, unspecified: Secondary | ICD-10-CM

## 2022-05-17 DIAGNOSIS — I5032 Chronic diastolic (congestive) heart failure: Secondary | ICD-10-CM | POA: Diagnosis not present

## 2022-05-17 DIAGNOSIS — W19XXXA Unspecified fall, initial encounter: Secondary | ICD-10-CM

## 2022-05-17 DIAGNOSIS — K7682 Hepatic encephalopathy: Secondary | ICD-10-CM

## 2022-05-17 DIAGNOSIS — A412 Sepsis due to unspecified staphylococcus: Secondary | ICD-10-CM | POA: Diagnosis not present

## 2022-05-17 DIAGNOSIS — M545 Low back pain, unspecified: Secondary | ICD-10-CM

## 2022-05-17 DIAGNOSIS — B957 Other staphylococcus as the cause of diseases classified elsewhere: Secondary | ICD-10-CM | POA: Diagnosis not present

## 2022-05-17 LAB — CULTURE, BLOOD (ROUTINE X 2): Special Requests: ADEQUATE

## 2022-05-17 LAB — GLUCOSE, CAPILLARY
Glucose-Capillary: 155 mg/dL — ABNORMAL HIGH (ref 70–99)
Glucose-Capillary: 254 mg/dL — ABNORMAL HIGH (ref 70–99)
Glucose-Capillary: 166 mg/dL — ABNORMAL HIGH (ref 70–99)
Glucose-Capillary: 173 mg/dL — ABNORMAL HIGH (ref 70–99)

## 2022-05-17 LAB — COMPREHENSIVE METABOLIC PANEL
ALT: 51 U/L — ABNORMAL HIGH (ref 0–44)
AST: 78 U/L — ABNORMAL HIGH (ref 15–41)
Albumin: 2.8 g/dL — ABNORMAL LOW (ref 3.5–5.0)
Alkaline Phosphatase: 72 U/L (ref 38–126)
Anion gap: 4 — ABNORMAL LOW (ref 5–15)
BUN: 11 mg/dL (ref 8–23)
CO2: 27 mmol/L (ref 22–32)
Calcium: 8.1 mg/dL — ABNORMAL LOW (ref 8.9–10.3)
Chloride: 104 mmol/L (ref 98–111)
Creatinine, Ser: 0.8 mg/dL (ref 0.61–1.24)
GFR, Estimated: >60 mL/min (ref >60)
Glucose, Bld: 155 mg/dL — ABNORMAL HIGH (ref 70–99)
Potassium: 3.9 mmol/L (ref 3.5–5.1)
Sodium: 135 mmol/L (ref 135–145)
Total Bilirubin: 2.2 mg/dL — ABNORMAL HIGH (ref 0.3–1.2)
Total Protein: 6 g/dL — ABNORMAL LOW (ref 6.5–8.1)

## 2022-05-17 LAB — PROTIME-INR
INR: 1.3 — ABNORMAL HIGH (ref 0.8–1.2)
Prothrombin Time: 15.9 seconds — ABNORMAL HIGH (ref 11.4–15.2)

## 2022-05-17 LAB — ECHOCARDIOGRAM COMPLETE
AR max vel: 1.54 cm2
AV Area VTI: 1.57 cm2
AV Area mean vel: 1.47 cm2
AV Mean grad: 17.5 mmHg
AV Peak grad: 30 mmHg
Ao pk vel: 2.74 m/s
Area-P 1/2: 1.24 cm2
Calc EF: 75.7 %
Height: 74 in
S' Lateral: 3.6 cm
Single Plane A2C EF: 77.9 %
Single Plane A4C EF: 73.1 %
Weight: 3777.8 oz

## 2022-05-17 LAB — CBC
HCT: 30.1 % — ABNORMAL LOW (ref 39.0–52.0)
Hemoglobin: 10.7 g/dL — ABNORMAL LOW (ref 13.0–17.0)
MCH: 32.6 pg (ref 26.0–34.0)
MCHC: 35.5 g/dL (ref 30.0–36.0)
MCV: 91.8 fL (ref 80.0–100.0)
Platelets: 40 10*3/uL — ABNORMAL LOW (ref 150–400)
RBC: 3.28 MIL/uL — ABNORMAL LOW (ref 4.22–5.81)
RDW: 14.5 % (ref 11.5–15.5)
WBC: 4.7 10*3/uL (ref 4.0–10.5)
nRBC: 0 % (ref 0.0–0.2)

## 2022-05-17 LAB — PHOSPHORUS: Phosphorus: 2.6 mg/dL (ref 2.5–4.6)

## 2022-05-17 LAB — LACTIC ACID, PLASMA
Lactic Acid, Venous: 1.5 mmol/L (ref 0.5–1.9)
Lactic Acid, Venous: 2.2 mmol/L (ref 0.5–1.9)

## 2022-05-17 LAB — AMMONIA: Ammonia: 53 umol/L — ABNORMAL HIGH (ref 9–35)

## 2022-05-17 LAB — MAGNESIUM: Magnesium: 1.6 mg/dL — ABNORMAL LOW (ref 1.7–2.4)

## 2022-05-17 LAB — VANCOMYCIN, PEAK: Vancomycin Pk: 29 ug/mL — ABNORMAL LOW (ref 30–40)

## 2022-05-17 MED ORDER — LORAZEPAM 2 MG/ML IJ SOLN: 0.5000 mg | Freq: Once | INTRAMUSCULAR | Status: DC | PRN

## 2022-05-17 MED ORDER — INSULIN GLARGINE-YFGN 100 UNIT/ML ~~LOC~~ SOLN
15.0000 [IU] | Freq: Every day | SUBCUTANEOUS | Status: DC
  Administered 2022-05-17: 15 [IU] via SUBCUTANEOUS
  Filled 2022-05-17 (×2): qty 0.15

## 2022-05-17 MED ORDER — LACTULOSE 10 GM/15ML PO SOLN
20.0000 g | Freq: Three times a day (TID) | ORAL | Status: DC
  Administered 2022-05-17: 20 g via ORAL
  Filled 2022-05-17: qty 30

## 2022-05-17 MED ORDER — SODIUM CHLORIDE 0.9 % IV SOLN: INTRAVENOUS | Status: DC | PRN

## 2022-05-17 MED ORDER — CEFAZOLIN SODIUM-DEXTROSE 2-4 GM/100ML-% IV SOLN
2.0000 g | Freq: Three times a day (TID) | INTRAVENOUS | Status: DC
Start: 2022-05-17 — End: 2022-05-28
  Administered 2022-05-17 – 2022-05-28 (×32): 2 g via INTRAVENOUS
  Filled 2022-05-17 (×34): qty 100

## 2022-05-17 MED ORDER — TRAMADOL HCL 50 MG PO TABS
50.0000 mg | ORAL_TABLET | Freq: Two times a day (BID) | ORAL | Status: DC | PRN
  Administered 2022-05-17 – 2022-05-19 (×3): 50 mg via ORAL
  Filled 2022-05-17 (×3): qty 1

## 2022-05-17 MED ORDER — INSULIN ASPART 100 UNIT/ML IJ SOLN
3.0000 [IU] | Freq: Three times a day (TID) | INTRAMUSCULAR | Status: DC
  Administered 2022-05-17 – 2022-05-18 (×3): 3 [IU] via SUBCUTANEOUS
  Filled 2022-05-17 (×3): qty 1

## 2022-05-17 MED ORDER — INSULIN ASPART 100 UNIT/ML IJ SOLN
0.0000 [IU] | Freq: Every day | INTRAMUSCULAR | Status: DC
  Administered 2022-05-20: 2 [IU] via SUBCUTANEOUS
  Filled 2022-05-17: qty 1

## 2022-05-17 MED ORDER — MAGNESIUM SULFATE 2 GM/50ML IV SOLN
2.0000 g | Freq: Once | INTRAVENOUS | Status: AC
  Administered 2022-05-17: 2 g via INTRAVENOUS
  Filled 2022-05-17: qty 50

## 2022-05-17 MED ORDER — PERFLUTREN LIPID MICROSPHERE
1.0000 mL | INTRAVENOUS | Status: AC | PRN
  Administered 2022-05-17: 2 mL via INTRAVENOUS

## 2022-05-17 MED ORDER — INSULIN ASPART 100 UNIT/ML IJ SOLN
0.0000 [IU] | Freq: Three times a day (TID) | INTRAMUSCULAR | Status: DC
  Administered 2022-05-17: 3 [IU] via SUBCUTANEOUS
  Administered 2022-05-18: 8 [IU] via SUBCUTANEOUS
  Administered 2022-05-18: 3 [IU] via SUBCUTANEOUS
  Administered 2022-05-18: 8 [IU] via SUBCUTANEOUS
  Administered 2022-05-19: 2 [IU] via SUBCUTANEOUS
  Administered 2022-05-19: 3 [IU] via SUBCUTANEOUS
  Administered 2022-05-19: 8 [IU] via SUBCUTANEOUS
  Administered 2022-05-20: 3 [IU] via SUBCUTANEOUS
  Administered 2022-05-20 – 2022-05-21 (×2): 2 [IU] via SUBCUTANEOUS
  Administered 2022-05-21 (×2): 3 [IU] via SUBCUTANEOUS
  Administered 2022-05-22: 8 [IU] via SUBCUTANEOUS
  Administered 2022-05-22: 3 [IU] via SUBCUTANEOUS
  Administered 2022-05-22: 2 [IU] via SUBCUTANEOUS
  Administered 2022-05-23: 8 [IU] via SUBCUTANEOUS
  Administered 2022-05-23 (×2): 2 [IU] via SUBCUTANEOUS
  Administered 2022-05-24: 5 [IU] via SUBCUTANEOUS
  Administered 2022-05-24 (×2): 2 [IU] via SUBCUTANEOUS
  Administered 2022-05-25: 5 [IU] via SUBCUTANEOUS
  Administered 2022-05-25 (×2): 2 [IU] via SUBCUTANEOUS
  Administered 2022-05-26 (×2): 5 [IU] via SUBCUTANEOUS
  Administered 2022-05-26 – 2022-05-27 (×2): 2 [IU] via SUBCUTANEOUS
  Administered 2022-05-27 – 2022-05-28 (×3): 8 [IU] via SUBCUTANEOUS
  Filled 2022-05-17 (×28): qty 1

## 2022-05-17 NOTE — Progress Notes (Signed)
PT Cancellation Note  Patient Details Name: Joshua Berry MRN: [id number] DOB: 1945-12-31   Cancelled Treatment:    Reason Eval/Treat Not Completed: Other (comment) Attempted to see pt for PT tx but pt noted to be out of room. Will f/u as able.  Lavone Nian, PT, DPT 05/17/22, 2:13 PM   Waunita Schooner 05/17/2022, 2:13 PM

## 2022-05-17 NOTE — Assessment & Plan Note (Signed)
Lumbar MRI with DDD but no concern for discitis or osteomyelitis.  No fracture. -OOB, PT/OT and pain control.

## 2022-05-17 NOTE — TOC Initial Note (Signed)
Transition of Care [facility]) - Initial/Assessment Note    Patient Details  Name: Joshua Berry MRN: [id number] Date of Birth: 11-Jul-1946  [facility]) CM/SW Contact:    Magnus Ivan, LCSW Phone Number: 05/17/2022, 1:49 PM  Clinical Narrative:             CSW spoke to patient's spouse Baker Janus via phone. Patient lives with Baker Janus. His brother is also in town to assist when DC.  PCP and Pharmacy is [facility].     Baker Janus stated patient has [facility] HHPT and OT, she would like to add RN. CSW called Sarah with [name], she confirmed they are active with PT and OT and confirmed they can add RN. She will need HH resumption orders at DC.  Baker Janus stated patient also has an aide through the [place] who helps in home 5 days per week, 4 hours per day. Baker Janus stated patient has all needed DME including a walker, grab bars, bedside commode, shower seat.  Baker Janus declined SNF. She stated they have a good set up at home.    Expected Discharge Plan: [place] Barriers to Discharge: Continued Medical Work up   Patient Goals and CMS Choice Patient states their goals for this hospitalization and ongoing recovery are:: return home with home health and aide CMS Medicare.gov Compare Post Acute Care list provided to:: Patient Represenative (must comment) Choice offered to / list presented to : Spouse  Expected Discharge Plan and Services Expected Discharge Plan: [place]       Living arrangements for the past 2 months: [place] Agency: Learned Date Brenda: 05/17/22   Representative spoke with at [place]: [place] [facility])  Prior Living Arrangements/Services Living arrangements for the past 2 months: [place] Lives with:: Spouse Patient language and need for interpreter reviewed:: Yes Do you feel safe going back to the place where you live?: Yes       Need for Family Participation in Patient Care: Yes (Comment) Care giver support system in place?: Yes (comment) Current home services: Homehealth aide, Home PT, Home OT Criminal Activity/Legal Involvement Pertinent to Current Situation/Hospitalization: No - Comment as needed  Activities of Daily Living Home Assistive Devices/Equipment: None ADL Screening (condition at time of admission) Patient's cognitive ability adequate to safely complete daily activities?: No Is the patient deaf or have difficulty hearing?: No Does the patient have difficulty seeing, even when wearing glasses/contacts?: No Does the patient have difficulty concentrating, remembering, or making decisions?: No Patient able to express need for assistance with ADLs?: Yes Does the patient have difficulty dressing or bathing?: No Independently performs ADLs?: Yes (appropriate for developmental age) Does the patient have difficulty walking or climbing stairs?: No Weakness of Legs: Both Weakness of Arms/Hands: Both  Permission Sought/Granted Permission sought to share information with : Chartered certified accountant granted to share information with : Yes, Verbal Permission Granted     Permission granted to share info w AGENCY: [facility]        Emotional Assessment         Alcohol / Substance Use: Not Applicable Psych Involvement: No (comment)  Admission diagnosis:  Shock circulatory ([place]) [R57.9] Hypotension, unspecified hypotension type [I95.9] Altered mental status, unspecified altered mental  status type [R41.82] Patient Active Problem List   Diagnosis Date Noted   AKI (acute kidney injury) ([place]) 05/16/2022   Elevated liver enzymes 05/16/2022   Hypotension 05/16/2022   Right shoulder pain 05/16/2022   Hypothyroidism 05/16/2022   Physical deconditioning 05/16/2022   Obesity (BMI 30-39.9) 05/16/2022   Lactic acidosis 05/16/2022   Fall at home, initial encounter 05/16/2022   Septic shock  due to Staphylococcus bacteremia 05/14/2022   Chest pain 04/06/2022   Controlled IDDM-2 with hyperglycemia 04/06/2022   HLD (hyperlipidemia) 04/06/2022   HTN (hypertension) 04/06/2022   Chronic diastolic CHF (congestive heart failure) ([place]) 04/06/2022   Pancytopenia ([place]) 04/06/2022   GI bleeding 04/24/2021   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    Secondary esophageal varices without bleeding ([place])    Portal hypertension ([place])    Stomach irritation    Melena    Acute gastrointestinal hemorrhage 07/19/2019   Sepsis ([place]) 12/20/2018   PCP:  Lorelei Pont, MD Pharmacy:   [id number] [place]: [phone]: [address]Phone: [phone]: [phone] [place]Phone: [phone]: [phone]    Social Determinants of Health (SDOH) Interventions    Readmission Risk Interventions    05/17/2022    1:45 PM  Readmission Risk Prevention Plan  Transportation Screening Complete  PCP or Specialist Appt within 5-7 Days Complete  Home Care Screening Complete  Medication Review (RN CM) Complete

## 2022-05-17 NOTE — Plan of Care (Signed)

## 2022-05-17 NOTE — Progress Notes (Signed)
PROGRESS NOTE  Joshua Berry XNT:[id number] DOB: 09-23-1946   PCP: Lorelei Pont, MD  Patient is from: Home.  Lives with wife.  Reports using walker and cane at baseline.  DOA: 05/14/2022 LOS: 3  Chief complaints Chief Complaint  Patient presents with   Altered Mental Status     Brief Narrative / Interim history: 76 year old M with PMH of liver cirrhosis, portal HTN, GIB/GAVE, diastolic CHF, pancytopenia, OSA, TAVR, poor dentition and chronic right shoulder pain presenting with malaise and altered mental status and admitted to ICU with septic shock due to coagulase-negative staph bacteremia, and AKI requiring vasopressors and IV antibiotics.  BC ID with Staphylococcus species but not aureus or MRSA.  MRSA PCR screen negative.  ID consulted and recommended TTE +/-TEE to rule out endocarditis.  Patient was weaned off vasopressor and transferred to [facility] service on 05/16/2022. Repeat blood culture NGTD.  TTE without significant finding or vegetation.  MRI lumbar spine with significant DDD.  Infectious disease following.   Subjective: Seen and examined earlier this morning.  No major events overnight of this morning.  No complaints other than mild abdominal pain.  Shoulder pain improved.  Responds yes to back pain.   Objective: Vitals:   05/16/22 1944 05/17/22 0359 05/17/22 0500 05/17/22 0745  BP: 132/65 123/61  125/61  Pulse: 62 63  (!) 54  Resp: 20 16  16   Temp: 99.4 F (37.4 C) 98.4 F (36.9 C)  98.6 F (37 C)  TempSrc: Oral Oral  Oral  SpO2: 96% 95%  98%  Weight:   107.1 kg   Height:        Examination:   GENERAL: No apparent distress.  Nontoxic. HEENT: MMM.  Vision and hearing grossly intact.  NECK: Supple.  No apparent JVD.  RESP:  No IWOB.  Fair aeration bilaterally. CVS:  RRR. Heart sounds normal.  ABD/GI/GU: BS+. Abd soft, NTND.  MSK/EXT:  Moves extremities. No apparent deformity. No edema.  Mild tenderness over upper lumbar spines. SKIN: no  apparent skin lesion or wound NEURO: Awake but not quite alert.  Oriented x4 except date and month.  No apparent focal neuro deficit. PSYCH: Calm. Normal affect.   Procedures:  None  Microbiology summarized: 5/30-COVID-19 and influenza PCR nonreactive. 5/30-MRSA PCR screen negative. 5/30-blood culture Staphylococcus caprae  Assessment and Plan: * Septic shock due to Staphylococcus bacteremia Suspect odontogenic source given poor dentition.  Blood culture with Staphylococcus Caprae.  MRSA PCR screen negative.  Sepsis physiology resolved.  No significant finding on TTE.  Repeat blood cultures NGTD.  MRI lumbar spine with DDD but no suspicion for discitis osteomyelitis. -Infectious disease following. -Defer antibiotics to ID.  Continue IV vancomycin for now -ID recommends TEE.  Cardiology consulted. -Dental extraction is ideal but we don't have in house dentist  AKI (acute kidney injury) ([place]) Recent Labs    04/06/22 1122 04/07/22 0557 05/14/22 1104 05/15/22 0323 05/16/22 0441 05/17/22 0550  BUN 15 15 18 17 15 11   CREATININE 1.15 1.11 1.29* 1.26* 0.96 0.80  Likely due to sepsis.  Resolved. -Avoid or minimize nephrotoxins   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 Looks compensated.  Mild LFT elevation with mild hyperbilirubinemia likely from sepsis.  Ammonia 53. -May benefit from Lasix and Aldactone once blood pressure improves. -Continue rifaximin -Increase lactulose to 20 g 3 times daily -Monitor LFT.  Fall at home, initial encounter Reportedly slipped and fell at home.  No notable injury.  CT head and cervical spine and  MRI lumbar spine without acute finding. -Fall precaution -PT/OT  Hypotension Initially due to septic shock.  He came off vasopressor.  Soft blood pressure earlier this morning. -Start midodrine  Chronic diastolic CHF (congestive heart failure) (HCC) TTE in 2020 with LVEF of 55 to 65%.  Seems to be on p.o. Lasix 20 mg  daily at home.  Appears euvolemic on exam.  TTE without significant finding. -Hold diuretics given soft blood pressure -Monitor I and O's, daily weight, renal functions and electrolytes   Controlled IDDM-2 with hyperglycemia A1c 6.1% about 1 month ago.  Uses Lantus 16 units daily and NovoLog 10 units 3 times daily Recent Labs  Lab 05/16/22 1109 05/16/22 1649 05/16/22 2057 05/17/22 0743 05/17/22 1153  GLUCAP 278* 200* 184* 155* 254*  -Continue SSI-moderate -Increase semaglutide from 15 to 20 units daily -Add NovoLog 3 units 3 times daily with meals -Further adjustment as appropriate.   Acute hepatic encephalopathy (HCC) Ammonia elevated to 53.  Increase lactulose.  Hypomagnesemia Mg 1.6.  IV magnesium sulfate 2 g x 1  Back pain Lumbar MRI with DDD but no concern for discitis or osteomyelitis.  No fracture. -OOB, PT/OT and pain control.  Lactic acidosis Likely due to sepsis.  Resolving.  Obesity (BMI 30-39.9) Body mass index is 30.57 kg/m.   Physical deconditioning PT/OT  Hypothyroidism Continue home Synthroid.  Right shoulder pain Chronic.  Unchanged per patient. -Tylenol, oxycodone and fentanyl as needed  Pancytopenia (HCC) Likely due to liver cirrhosis.  Leukopenia resolved.  Thrombocytopenia improving.  H&H stable. -Continue monitoring    DVT prophylaxis:  SCDs Start: 05/14/22 1608  Code Status: Full code Family Communication: Updated patient's wife over the phone. Level of care: Med-Surg Status is: Inpatient Remains inpatient appropriate because: Staphylococcus bacteremia and further evaluation to rule out endocarditis   Final disposition: TBD Consultants:  Pulmonology admitted patient Infectious disease Cardiology  Sch Meds:  Scheduled Meds:  Chlorhexidine Gluconate Cloth  6 each Topical Q0600   insulin aspart  0-15 Units Subcutaneous TID WC   insulin aspart  0-5 Units Subcutaneous QHS   insulin aspart  3 Units Subcutaneous TID WC    insulin glargine-yfgn  15 Units Subcutaneous QHS   lactulose  20 g Oral TID   levothyroxine  100 mcg Oral Q0600   lidocaine  1 patch Transdermal Q24H   midodrine  5 mg Oral TID WC   pantoprazole  40 mg Oral Daily   rifaximin  550 mg Oral BID   Continuous Infusions:   ceFAZolin (ANCEF) IV     PRN Meds:.acetaminophen, LORazepam, oxyCODONE, polyethylene glycol, traMADol  Antimicrobials: Anti-infectives (From admission, onward)    Start     Dose/Rate Route Frequency Ordered Stop   05/17/22 2200  ceFAZolin (ANCEF) IVPB 2g/100 mL premix        2 g 200 mL/hr over 30 Minutes Intravenous Every 8 hours 05/17/22 1424     05/15/22 1400  rifaximin (XIFAXAN) tablet 550 mg        550 mg Oral 2 times daily 05/15/22 1229     05/15/22 1000  vancomycin (VANCOREADY) IVPB 2000 mg/400 mL  Status:  Discontinued        2,000 mg 200 mL/hr over 120 Minutes Intravenous Every 24 hours 05/15/22 0306 05/17/22 1424   05/14/22 2000  cefTRIAXone (ROCEPHIN) 2 g in sodium chloride 0.9 % 100 mL IVPB  Status:  Discontinued        2 g 200 mL/hr over 30 Minutes Intravenous Every 24 hours  05/14/22 1617 05/15/22 0257   05/14/22 1800  azithromycin (ZITHROMAX) 500 mg in sodium chloride 0.9 % 250 mL IVPB  Status:  Discontinued        500 mg 250 mL/hr over 60 Minutes Intravenous Every 24 hours 05/14/22 1617 05/15/22 1512   05/14/22 1330  vancomycin (VANCOREADY) IVPB 1250 mg/250 mL        1,250 mg 166.7 mL/hr over 90 Minutes Intravenous  Once 05/14/22 1220 05/14/22 1435   05/14/22 1215  ceFEPIme (MAXIPIME) 2 g in sodium chloride 0.9 % 100 mL IVPB        2 g 200 mL/hr over 30 Minutes Intravenous  Once 05/14/22 1202 05/14/22 1300   05/14/22 1215  vancomycin (VANCOCIN) IVPB 1000 mg/200 mL premix        1,000 mg 200 mL/hr over 60 Minutes Intravenous  Once 05/14/22 1202 05/14/22 1331        I have personally reviewed the following labs and images: CBC: Recent Labs  Lab 05/14/22 1104 05/15/22 0323 05/16/22 0441  05/17/22 0550  WBC 4.2 3.8* 4.8 4.7  NEUTROABS 3.5  --   --   --   HGB 11.8* 10.1* 9.8* 10.7*  HCT 34.1* 29.0* 28.6* 30.1*  MCV 94.7 92.7 92.9 91.8  PLT 35* 26* 26* 40*   BMP &GFR Recent Labs  Lab 05/14/22 1104 05/14/22 1609 05/15/22 0323 05/16/22 0441 05/17/22 0550  NA 136  --  136 136 135  K 4.0  --  4.0 3.7 3.9  CL 104  --  108 107 104  CO2 20*  --  23 24 27   GLUCOSE 185*  --  193* 167* 155*  BUN 18  --  17 15 11   CREATININE 1.29*  --  1.26* 0.96 0.80  CALCIUM 8.8*  --  8.0* 8.2* 8.1*  MG  --  1.7 1.5* 1.7 1.6*  PHOS  --  2.8 3.3  --  2.6   Estimated Creatinine Clearance: 100.8 mL/min (by C-G formula based on SCr of 0.8 mg/dL). Liver & Pancreas: Recent Labs  Lab 05/14/22 1104 05/15/22 0323 05/16/22 0441 05/17/22 0550  AST 76* 92* 80* 78*  ALT 57* 55* 47* 51*  ALKPHOS 86 72 63 72  BILITOT 2.3* 1.9* 1.8* 2.2*  PROT 6.8 5.6* 5.5* 6.0*  ALBUMIN 3.3* 2.7* 2.6* 2.8*   No results for input(s): LIPASE, AMYLASE in the last 168 hours. Recent Labs  Lab 05/14/22 1104 05/17/22 0550  AMMONIA 72* 53*   Diabetic: No results for input(s): HGBA1C in the last 72 hours. Recent Labs  Lab 05/16/22 1109 05/16/22 1649 05/16/22 2057 05/17/22 0743 05/17/22 1153  GLUCAP 278* 200* 184* 155* 254*   Cardiac Enzymes: No results for input(s): CKTOTAL, CKMB, CKMBINDEX, TROPONINI in the last 168 hours. No results for input(s): PROBNP in the last 8760 hours. Coagulation Profile: Recent Labs  Lab 05/17/22 0550  INR 1.3*   Thyroid Function Tests: No results for input(s): TSH, T4TOTAL, FREET4, T3FREE, THYROIDAB in the last 72 hours. Lipid Profile: No results for input(s): CHOL, HDL, LDLCALC, TRIG, CHOLHDL, LDLDIRECT in the last 72 hours. Anemia Panel: No results for input(s): VITAMINB12, FOLATE, FERRITIN, TIBC, IRON, RETICCTPCT in the last 72 hours. Urine analysis:    Component Value Date/Time   COLORURINE YELLOW (A) 05/14/2022 1104   APPEARANCEUR CLEAR (A) 05/14/2022  1104   APPEARANCEUR Hazy 05/01/2014 1653   LABSPEC 1.021 05/14/2022 1104   LABSPEC 1.015 05/01/2014 1653   PHURINE 7.0 05/14/2022 1104   GLUCOSEU NEGATIVE  05/14/2022 1104   GLUCOSEU Negative 05/01/2014 [place] 05/14/2022 [place] 05/14/2022 1104   BILIRUBINUR Negative 05/01/2014 [place] 05/14/2022 [place] 05/14/2022 1104   NITRITE NEGATIVE 05/14/2022 [place] 05/14/2022 1104   LEUKOCYTESUR Negative 05/01/2014 1653   Sepsis Labs: Invalid input(s): PROCALCITONIN, [place]  Microbiology: Recent Results (from the past 240 hour(s))  Blood Culture (routine x 2)     Status: Abnormal   Collection Time: 05/14/22 11:06 AM   Specimen: BLOOD  Result Value Ref Range Status   Specimen Description   Final    BLOOD BLOOD RIGHT FOREARM Performed at [facility], [address], [place], [place] [address]    Special Requests   Final    BOTTLES DRAWN AEROBIC AND ANAEROBIC Blood Culture adequate volume Performed at [facility], [address] [place], [place] [address]    Culture  Setup Time   Final    Organism ID to follow GRAM POSITIVE COCCI IN BOTH AEROBIC AND ANAEROBIC BOTTLES CRITICAL RESULT CALLED TO, READ BACK BY AND VERIFIED WITH: NATHAN BELUTH @ [id number] ON 05/15/2022.Marland KitchenMarland KitchenTKR    Culture STAPHYLOCOCCUS CAPRAE (A)  Final   Report Status 05/17/2022 FINAL  Final   Organism ID, Bacteria STAPHYLOCOCCUS CAPRAE  Final      Susceptibility   Staphylococcus caprae - MIC*    CIPROFLOXACIN 1 SENSITIVE Sensitive     ERYTHROMYCIN <=0.25 SENSITIVE Sensitive     GENTAMICIN <=0.5 SENSITIVE Sensitive     OXACILLIN <=0.25 SENSITIVE Sensitive     TETRACYCLINE <=1 SENSITIVE Sensitive     VANCOMYCIN <=0.5 SENSITIVE Sensitive     TRIMETH/SULFA <=10 SENSITIVE Sensitive     CLINDAMYCIN <=0.25 SENSITIVE Sensitive     RIFAMPIN >=32 RESISTANT Resistant     Inducible Clindamycin NEGATIVE Sensitive      * STAPHYLOCOCCUS CAPRAE  Blood Culture ID Panel (Reflexed)     Status: Abnormal   Collection Time: 05/14/22 11:06 AM  Result Value Ref Range Status   Enterococcus faecalis NOT DETECTED NOT DETECTED Final   Enterococcus Faecium NOT DETECTED NOT DETECTED Final   Listeria monocytogenes NOT DETECTED NOT DETECTED Final   Staphylococcus species DETECTED (A) NOT DETECTED Final    Comment: NATHAN BELUTH @ [id number] ON 05/15/2022.Marland KitchenMarland KitchenTKR   Staphylococcus aureus (BCID) NOT DETECTED NOT DETECTED Final   Staphylococcus epidermidis NOT DETECTED NOT DETECTED Final   Staphylococcus lugdunensis NOT DETECTED NOT DETECTED Final   Streptococcus species NOT DETECTED NOT DETECTED Final   Streptococcus agalactiae NOT DETECTED NOT DETECTED Final   Streptococcus pneumoniae NOT DETECTED NOT DETECTED Final   Streptococcus pyogenes NOT DETECTED NOT DETECTED Final   A.calcoaceticus-baumannii NOT DETECTED NOT DETECTED Final   Bacteroides fragilis NOT DETECTED NOT DETECTED Final   Enterobacterales NOT DETECTED NOT DETECTED Final   Enterobacter cloacae complex NOT DETECTED NOT DETECTED Final   Escherichia coli NOT DETECTED NOT DETECTED Final   Klebsiella aerogenes NOT DETECTED NOT DETECTED Final   Klebsiella oxytoca NOT DETECTED NOT DETECTED Final   Klebsiella pneumoniae NOT DETECTED NOT DETECTED Final   Proteus species NOT DETECTED NOT DETECTED Final   Salmonella species NOT DETECTED NOT DETECTED Final   Serratia marcescens NOT DETECTED NOT DETECTED Final   Haemophilus influenzae NOT DETECTED NOT DETECTED Final   Neisseria meningitidis NOT DETECTED NOT DETECTED Final   Pseudomonas aeruginosa NOT DETECTED NOT DETECTED Final   Stenotrophomonas maltophilia NOT DETECTED NOT DETECTED Final   Candida albicans  NOT DETECTED NOT DETECTED Final   Candida auris NOT DETECTED NOT DETECTED Final   Candida glabrata NOT DETECTED NOT DETECTED Final   Candida krusei NOT DETECTED NOT DETECTED Final   Candida parapsilosis NOT DETECTED  NOT DETECTED Final   Candida tropicalis NOT DETECTED NOT DETECTED Final   Cryptococcus neoformans/gattii NOT DETECTED NOT DETECTED Final    Comment: Performed at [facility], [address] [place], [place] [address]  Blood Culture (routine x 2)     Status: Abnormal   Collection Time: 05/14/22 11:22 AM   Specimen: BLOOD  Result Value Ref Range Status   Specimen Description   Final    BLOOD RIGHT ARM Performed at [facility], [address], [place], [place] [address]    Special Requests   Final    BOTTLES DRAWN AEROBIC AND ANAEROBIC Blood Culture results may not be optimal due to an excessive volume of blood received in culture bottles Performed at [facility], [address] [place], [place] [address]    Culture  Setup Time   Final    GRAM POSITIVE COCCI IN BOTH AEROBIC AND ANAEROBIC BOTTLES CRITICAL VALUE NOTED.  VALUE IS CONSISTENT WITH PREVIOUSLY REPORTED AND CALLED VALUE.    Culture (A)  Final    STAPHYLOCOCCUS CAPRAE SUSCEPTIBILITIES PERFORMED ON PREVIOUS CULTURE WITHIN THE LAST 5 DAYS. Performed at [facility], [place] [address], [place], [place] [address]    Report Status 05/17/2022 FINAL  Final  Resp Panel by RT-PCR (Flu A&B, Covid) Anterior Nasal Swab     Status: None   Collection Time: 05/14/22 12:04 PM   Specimen: Anterior Nasal Swab  Result Value Ref Range Status   SARS Coronavirus 2 by RT PCR NEGATIVE NEGATIVE Final    Comment: (NOTE) SARS-CoV-2 target nucleic acids are NOT DETECTED.  The SARS-CoV-2 RNA is generally detectable in upper respiratory specimens during the acute phase of infection. The lowest concentration of SARS-CoV-2 viral copies this assay can detect is 138 copies/mL. A negative result does not preclude SARS-Cov-2 infection and should not be used as the sole basis for treatment or other patient management decisions. A negative result may occur with  improper specimen collection/handling, submission of  specimen other than nasopharyngeal swab, presence of viral mutation(s) within the areas targeted by this assay, and inadequate number of viral copies(<138 copies/mL). A negative result must be combined with clinical observations, patient history, and epidemiological information. The expected result is Negative.  Fact Sheet for Patients:  EntrepreneurPulse.com.au  Fact Sheet for Healthcare Providers:  IncredibleEmployment.be  This test is no t yet approved or cleared by the [place] FDA and  has been authorized for detection and/or diagnosis of SARS-CoV-2 by FDA under an Emergency Use Authorization (EUA). This EUA will remain  in effect (meaning this test can be used) for the duration of the COVID-19 declaration under Section 564(b)(1) of the Act, 21 U.S.C.section 360bbb-3(b)(1), unless the authorization is terminated  or revoked sooner.       Influenza A by PCR NEGATIVE NEGATIVE Final   Influenza B by PCR NEGATIVE NEGATIVE Final    Comment: (NOTE) The Xpert Xpress SARS-CoV-2/FLU/RSV plus assay is intended as an aid in the diagnosis of influenza from Nasopharyngeal swab specimens and should not be used as a sole basis for treatment. Nasal washings and aspirates are unacceptable for Xpert Xpress SARS-CoV-2/FLU/RSV testing.  Fact Sheet for Patients: EntrepreneurPulse.com.au  Fact Sheet for Healthcare Providers: IncredibleEmployment.be  This test is not yet approved  or cleared by the [place] and has been authorized for detection and/or diagnosis of SARS-CoV-2 by FDA under an Emergency Use Authorization (EUA). This EUA will remain in effect (meaning this test can be used) for the duration of the COVID-19 declaration under Section 564(b)(1) of the Act, 21 U.S.C. section 360bbb-3(b)(1), unless the authorization is terminated or revoked.  Performed at [facility], [address] [place], [place] [address]   MRSA Next Gen by PCR, Nasal     Status: None   Collection Time: 05/14/22  9:29 PM   Specimen: Nasal Mucosa; Nasal Swab  Result Value Ref Range Status   MRSA by PCR Next Gen NOT DETECTED NOT DETECTED Final    Comment: (NOTE) The GeneXpert MRSA Assay (FDA approved for NASAL specimens only), is one component of a comprehensive MRSA colonization surveillance program. It is not intended to diagnose MRSA infection nor to guide or monitor treatment for MRSA infections. Test performance is not FDA approved in patients less than 72 years old. Performed at [facility], [address] [place], [place] [address]   Culture, blood (Routine X 2) w Reflex to ID Panel     Status: None (Preliminary result)   Collection Time: 05/16/22  3:30 PM   Specimen: BLOOD  Result Value Ref Range Status   Specimen Description BLOOD LAC  Final   Special Requests BOTTLES DRAWN AEROBIC AND ANAEROBIC BCAV  Final   Culture   Final    NO GROWTH < 24 HOURS Performed at [facility], [address] [place], [place] [address]    Report Status PENDING  Incomplete  Culture, blood (Routine X 2) w Reflex to ID Panel     Status: None (Preliminary result)   Collection Time: 05/16/22  3:35 PM   Specimen: BLOOD  Result Value Ref Range Status   Specimen Description BLOOD RAC  Final   Special Requests BOTTLES DRAWN AEROBIC AND ANAEROBIC BCAV  Final   Culture   Final    NO GROWTH < 24 HOURS Performed at [facility], [address] [place], [place] [address]    Report Status PENDING  Incomplete    Radiology Studies: MR LUMBAR SPINE WO CONTRAST  Result Date: 05/17/2022 CLINICAL DATA:  Low back pain and fever. EXAM: MRI LUMBAR SPINE WITHOUT CONTRAST TECHNIQUE: Multiplanar, multisequence MR imaging of the lumbar spine was performed. No intravenous contrast was administered. COMPARISON:  CT abdomen 07/21/2019 FINDINGS: Segmentation: The lowest lumbar type  non-rib-bearing vertebra is labeled as L5. Alignment:  No vertebral subluxation is observed. Vertebrae: Marrow heterogeneity is present. Although this can be caused by marrow infiltrative processes, the most common causes include anemia, smoking, obesity, or advancing age. Multilevel degenerative endplate findings in lumbar spine with substantial loss of intervertebral disc height at all levels between L1 and S1. There is some heterogeneity of disc signal without compelling findings of lumbar discitis. Chronic endplate compressions at T12, not changed from 07/21/2019. No findings of endplate osteomyelitis. Small Schmorl's node along the inferior endplate of L2. Conus medullaris and cauda equina: Conus extends to the L1 level. Conus and cauda equina appear normal. Paraspinal and other soft tissues: A fluid signal intensity lesion of the left kidney is partially characterized on today's exam. This is statistically likely to be a cyst but not technically specific. Disc levels: T12-L1: No impingement.  Mild disc bulge. L1-2: Moderate central narrowing of the thecal sac and mild bilateral foraminal stenosis with mild displacement of the right  L1 nerve in the lateral extraforaminal space as well as mild bilateral subarticular lateral recess stenosis due to disc osteophyte complex, right inferior foraminal disc protrusion, and degenerative facet arthropathy. L2-3: Mild to moderate left foraminal stenosis with mild left subarticular lateral recess stenosis and borderline central narrowing of the thecal sac due to disc bulge, intervertebral spurring, and facet arthropathy. L3-4: Prominent central narrowing of the thecal sac with mild bilateral foraminal stenosis and prominent bilateral subarticular lateral recess stenosis due to central disc protrusion, disc bulge, intervertebral spurring, and facet arthropathy. L4-5: Moderate to prominent left and moderate right subarticular lateral recess stenosis and borderline central  narrowing of the thecal sac due to disc osteophyte complex, facet arthropathy, and central disc protrusion extending caudad. L5-S1: Mild right foraminal stenosis and borderline bilateral subarticular lateral recess stenosis due to disc osteophyte complex and facet arthropathy. IMPRESSION: 1. No compelling findings of discitis-osteomyelitis at this time. Multilevel degenerative endplate findings are noted. 2. Lumbar spondylosis and degenerative disc disease causing prominent impingement at L3-4; moderate to prominent impingement at L4-5; moderate impingement at L1-2; mild to moderate impingement at L2-3; and mild impingement at L5-S1, as detailed above. 3. Marrow heterogeneity is present. Although this can be caused by marrow infiltrative processes, the most common causes include anemia, smoking, obesity, or advancing age. Electronically Signed   By: Van Clines M.D.   On: 05/17/2022 14:58   ECHOCARDIOGRAM COMPLETE  Result Date: 05/17/2022    ECHOCARDIOGRAM REPORT   Patient Name:   JAMALE SPANGLER Kerins Date of Exam: 05/17/2022 Medical Rec #:  [id number]     Height:       74.0 in Accession #:    [id number]    Weight:       236.1 lb Date of Birth:  01/25/1946     BSA:          2.332 m Patient Age:    42 years      BP:           125/61 mmHg Patient Gender: M             HR:           64 bpm. Exam Location:  Inpatient Procedure: 2D Echo, Color Doppler, Cardiac Doppler and Intracardiac            Opacification Agent Indications:     Bacteremia R78.81  History:         Patient has prior history of Echocardiogram examinations, most                  recent 10/09/2021. Signs/Symptoms:Murmur; Risk Factors:Sleep                  Apnea, Hypertension and Diabetes. NASH cirrhosis s/p TIPS.                  Aortic Valve: 29 mm Sapien prosthetic, stented (TAVR) valve is                  present in the aortic position. Procedure Date: 09/03/2021.  Sonographer:     Darlina Sicilian RDCS Referring Phys:  [id number] Tsosie Billing  Diagnosing Phys: Kathlyn Sacramento MD IMPRESSIONS  1. Left ventricular ejection fraction, by estimation, is 55 to 60%. The left ventricle has normal function. The left ventricle has no regional wall motion abnormalities. Left ventricular diastolic parameters are indeterminate.  2. Right ventricular systolic function is normal. The right ventricular size is normal. There is normal pulmonary artery systolic pressure.  3. Left atrial size was mildly dilated.  4. The mitral valve is normal in structure. No evidence of mitral valve regurgitation. No evidence of mitral stenosis. Moderate mitral annular calcification.  5. The aortic valve is normal in structure. Aortic valve regurgitation is mild. No aortic stenosis is present. There is a 29 mm Sapien prosthetic (TAVR) valve present in the aortic position. Procedure Date: 09/03/2021. Mild perivalvular regurgitation.  6. The inferior vena cava is dilated in size with >50% respiratory variability, suggesting right atrial pressure of 8 mmHg. Conclusion(s)/Recommendation(s): No evidence of valvular vegetations on this transthoracic echocardiogram. Consider a transesophageal echocardiogram to exclude infective endocarditis if clinically indicated. FINDINGS  Left Ventricle: Left ventricular ejection fraction, by estimation, is 55 to 60%. The left ventricle has normal function. The left ventricle has no regional wall motion abnormalities. Definity contrast agent was given IV to delineate the left ventricular  endocardial borders. The left ventricular internal cavity size was normal in size. There is no left ventricular hypertrophy. Left ventricular diastolic parameters are indeterminate. Right Ventricle: The right ventricular size is normal. No increase in right ventricular wall thickness. Right ventricular systolic function is normal. There is normal pulmonary artery systolic pressure. The tricuspid regurgitant velocity is 2.01 m/s, and  with an assumed right atrial pressure of 8  mmHg, the estimated right ventricular systolic pressure is 62.8 mmHg. Left Atrium: Left atrial size was mildly dilated. Right Atrium: Right atrial size was normal in size. Pericardium: There is no evidence of pericardial effusion. Mitral Valve: The mitral valve is normal in structure. Moderate mitral annular calcification. No evidence of mitral valve regurgitation. No evidence of mitral valve stenosis. Tricuspid Valve: The tricuspid valve is normal in structure. Tricuspid valve regurgitation is mild . No evidence of tricuspid stenosis. Aortic Valve: The aortic valve is normal in structure. Aortic valve regurgitation is mild. No aortic stenosis is present. Aortic valve mean gradient measures 17.5 mmHg. Aortic valve peak gradient measures 30.0 mmHg. Aortic valve area, by VTI measures 1.57 cm. 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 Procedure Date: 09/03/2021. Pulmonic Valve: The pulmonic valve was normal in structure. Pulmonic valve regurgitation is mild. No evidence of pulmonic stenosis. Aorta: The aortic root is normal in size and structure. Venous: The inferior vena cava is dilated in size with greater than 50% respiratory variability, suggesting right atrial pressure of 8 mmHg. IAS/Shunts: No atrial level shunt detected by color flow Doppler.  LEFT VENTRICLE PLAX 2D LVIDd:         6.25 cm      Diastology LVIDs:         3.60 cm      LV e' medial:    7.62 cm/s LV PW:         0.80 cm      LV E/e' medial:  16.0 LV IVS:        0.90 cm      LV e' lateral:   6.96 cm/s LVOT diam:     2.50 cm      LV E/e' lateral: 17.5 LV SV:         103 LV SV Index:   44 LVOT Area:     4.91 cm  LV Volumes (MOD) LV vol d, MOD A2C: 157.0 ml LV vol d, MOD A4C: 153.0 ml LV vol s, MOD A2C: 34.7 ml LV vol s, MOD A4C: 41.1 ml LV SV MOD A2C:     122.3 ml LV SV  MOD A4C:     153.0 ml LV SV MOD BP:      117.5 ml RIGHT VENTRICLE RV S prime:     13.40 cm/s TAPSE (M-mode): 2.3 cm LEFT ATRIUM              Index LA diam:        4.10 cm 1.76 cm/m LA Vol (A2C):   57.6 ml 24.70 ml/m LA Vol (A4C):   31.3 ml 13.42 ml/m LA Biplane Vol: 43.3 ml 18.57 ml/m  AORTIC VALVE AV Area (Vmax):    1.54 cm AV Area (Vmean):   1.47 cm AV Area (VTI):     1.57 cm AV Vmax:           274.00 cm/s AV Vmean:          199.000 cm/s AV VTI:            0.655 m AV Peak Grad:      30.0 mmHg AV Mean Grad:      17.5 mmHg LVOT Vmax:         85.80 cm/s LVOT Vmean:        59.500 cm/s LVOT VTI:          0.209 m LVOT/AV VTI ratio: 0.32  AORTA Ao Asc diam: 3.50 cm MITRAL VALVE                TRICUSPID VALVE MV Area (PHT): 1.24 cm     TR Peak grad:   16.2 mmHg MV Decel Time: 611 msec     TR Vmax:        201.00 cm/s MV E velocity: 122.00 cm/s MV A velocity: 120.00 cm/s  SHUNTS MV E/A ratio:  1.02         Systemic VTI:  0.21 m                             Systemic Diam: 2.50 cm Kathlyn Sacramento MD Electronically signed by Kathlyn Sacramento MD Signature Date/Time: 05/17/2022/1:11:47 PM    Final       An Lannan T. [place]  If 7PM-7AM, please contact night-coverage [email or web address] 05/17/2022, 4:17 PM

## 2022-05-17 NOTE — Assessment & Plan Note (Signed)
Mg 1.6.  IV magnesium sulfate 2 g x 1

## 2022-05-17 NOTE — Assessment & Plan Note (Addendum)
Ammonia level improved but he looks sleepy at this morning. -Continue lactulose -Discontinue tramadol and oxycodone.

## 2022-05-17 NOTE — Progress Notes (Signed)
ID Family at bedside. Patient is very slow to response Does not have any pain No appetite  On examination Awake Responds to commands slow but takes time and answers questions Patient Vitals for the past 24 hrs:  BP Temp Temp src Pulse Resp SpO2 Weight  05/17/22 1928 (!) 116/52 98.8 F (37.1 C) -- (!) 57 16 97 % --  05/17/22 1727 126/61 98.8 F (37.1 C) -- (!) 57 16 100 % --  05/17/22 0745 125/61 98.6 F (37 C) Oral (!) 54 16 98 % --  05/17/22 0500 -- -- -- -- -- -- 107.1 kg  05/17/22 0359 123/61 98.4 F (36.9 C) Oral 63 16 95 % --  05/16/22 1944 132/65 99.4 F (37.4 C) Oral 62 20 96 % --  Chest bilateral air entry Heart sound S1-S2 Abdomen soft CNS grossly nonfocal  Labs    Latest Ref Rng & Units 05/17/2022    5:50 AM 05/16/2022    4:41 AM 05/15/2022    3:23 AM  CBC  WBC 4.0 - 10.5 K/uL 4.7   4.8   3.8    Hemoglobin 13.0 - 17.0 g/dL 10.7   9.8   10.1    Hematocrit 39.0 - 52.0 % 30.1   28.6   29.0    Platelets 150 - 400 K/uL 40   26   26          Latest Ref Rng & Units 05/17/2022    5:50 AM 05/16/2022    4:41 AM 05/15/2022    3:23 AM  CMP  Glucose 70 - 99 mg/dL 155   167   193    BUN 8 - 23 mg/dL 11   15   17     Creatinine 0.61 - 1.24 mg/dL 0.80   0.96   1.26    Sodium 135 - 145 mmol/L 135   136   136    Potassium 3.5 - 5.1 mmol/L 3.9   3.7   4.0    Chloride 98 - 111 mmol/L 104   107   108    CO2 22 - 32 mmol/L 27   24   23     Calcium 8.9 - 10.3 mg/dL 8.1   8.2   8.0    Total Protein 6.5 - 8.1 g/dL 6.0   5.5   5.6    Total Bilirubin 0.3 - 1.2 mg/dL 2.2   1.8   1.9    Alkaline Phos 38 - 126 U/L 72   63   72    AST 15 - 41 U/L 78   80   92    ALT 0 - 44 U/L 51   47   55      Micro 05/14/22 Gram positive cocci- staphylococcus capri  05/16/22 Culture pending  Impression/recommendation 76 year old male with history of TAVR, cirrhosis, diabetes mellitus, severe thrombocytopenia, presented with fall confusion fever  Sepsis  Coag negative staph bacteremia 4 out of  4 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  And also with care team ID will follow him peripherally this weekend.  Call if needed

## 2022-05-18 DIAGNOSIS — E878 Other disorders of electrolyte and fluid balance, not elsewhere classified: Secondary | ICD-10-CM

## 2022-05-18 DIAGNOSIS — K7460 Unspecified cirrhosis of liver: Secondary | ICD-10-CM | POA: Diagnosis not present

## 2022-05-18 DIAGNOSIS — E871 Hypo-osmolality and hyponatremia: Secondary | ICD-10-CM

## 2022-05-18 DIAGNOSIS — E876 Hypokalemia: Secondary | ICD-10-CM

## 2022-05-18 DIAGNOSIS — I5032 Chronic diastolic (congestive) heart failure: Secondary | ICD-10-CM | POA: Diagnosis not present

## 2022-05-18 DIAGNOSIS — A412 Sepsis due to unspecified staphylococcus: Secondary | ICD-10-CM | POA: Diagnosis not present

## 2022-05-18 DIAGNOSIS — N179 Acute kidney failure, unspecified: Secondary | ICD-10-CM | POA: Diagnosis not present

## 2022-05-18 LAB — LACTIC ACID, PLASMA
Lactic Acid, Venous: 1.3 mmol/L (ref 0.5–1.9)
Lactic Acid, Venous: 1.9 mmol/L (ref 0.5–1.9)

## 2022-05-18 LAB — COMPREHENSIVE METABOLIC PANEL
ALT: 62 U/L — ABNORMAL HIGH (ref 0–44)
AST: 87 U/L — ABNORMAL HIGH (ref 15–41)
Albumin: 2.7 g/dL — ABNORMAL LOW (ref 3.5–5.0)
Alkaline Phosphatase: 84 U/L (ref 38–126)
Anion gap: 6 (ref 5–15)
BUN: 10 mg/dL (ref 8–23)
CO2: 26 mmol/L (ref 22–32)
Calcium: 8.3 mg/dL — ABNORMAL LOW (ref 8.9–10.3)
Chloride: 102 mmol/L (ref 98–111)
Creatinine, Ser: 0.74 mg/dL (ref 0.61–1.24)
GFR, Estimated: >60 mL/min (ref >60)
Glucose, Bld: 145 mg/dL — ABNORMAL HIGH (ref 70–99)
Potassium: 3.4 mmol/L — ABNORMAL LOW (ref 3.5–5.1)
Sodium: 134 mmol/L — ABNORMAL LOW (ref 135–145)
Total Bilirubin: 2 mg/dL — ABNORMAL HIGH (ref 0.3–1.2)
Total Protein: 6 g/dL — ABNORMAL LOW (ref 6.5–8.1)

## 2022-05-18 LAB — MAGNESIUM: Magnesium: 1.6 mg/dL — ABNORMAL LOW (ref 1.7–2.4)

## 2022-05-18 LAB — PROTIME-INR
INR: 1.3 — ABNORMAL HIGH (ref 0.8–1.2)
Prothrombin Time: 15.8 seconds — ABNORMAL HIGH (ref 11.4–15.2)

## 2022-05-18 LAB — GLUCOSE, CAPILLARY
Glucose-Capillary: 164 mg/dL — ABNORMAL HIGH (ref 70–99)
Glucose-Capillary: 262 mg/dL — ABNORMAL HIGH (ref 70–99)
Glucose-Capillary: 251 mg/dL — ABNORMAL HIGH (ref 70–99)

## 2022-05-18 LAB — AMMONIA: Ammonia: 71 umol/L — ABNORMAL HIGH (ref 9–35)

## 2022-05-18 LAB — PHOSPHORUS: Phosphorus: 3.1 mg/dL (ref 2.5–4.6)

## 2022-05-18 MED ORDER — LACTULOSE 10 GM/15ML PO SOLN
30.0000 g | Freq: Three times a day (TID) | ORAL | Status: DC
  Administered 2022-05-18 – 2022-05-19 (×6): 30 g via ORAL
  Filled 2022-05-18 (×6): qty 60

## 2022-05-18 MED ORDER — INSULIN GLARGINE-YFGN 100 UNIT/ML ~~LOC~~ SOLN
20.0000 [IU] | Freq: Every day | SUBCUTANEOUS | Status: DC
  Administered 2022-05-18 – 2022-05-26 (×9): 20 [IU] via SUBCUTANEOUS
  Filled 2022-05-18 (×10): qty 0.2

## 2022-05-18 MED ORDER — KETOTIFEN FUMARATE 0.025 % OP SOLN
1.0000 [drp] | Freq: Two times a day (BID) | OPHTHALMIC | Status: DC | PRN
Start: 2022-05-18 — End: 2022-05-28
  Administered 2022-05-25 – 2022-05-26 (×2): 1 [drp] via OPHTHALMIC
  Filled 2022-05-18: qty 5

## 2022-05-18 MED ORDER — POTASSIUM CHLORIDE CRYS ER 20 MEQ PO TBCR
40.0000 meq | EXTENDED_RELEASE_TABLET | ORAL | Status: AC
  Administered 2022-05-18 (×2): 40 meq via ORAL
  Filled 2022-05-18 (×2): qty 2

## 2022-05-18 MED ORDER — POLYVINYL ALCOHOL 1.4 % OP SOLN
1.0000 [drp] | OPHTHALMIC | Status: DC | PRN
  Administered 2022-05-26: 1 [drp] via OPHTHALMIC
  Filled 2022-05-18: qty 15

## 2022-05-18 MED ORDER — MAGNESIUM SULFATE 2 GM/50ML IV SOLN
2.0000 g | Freq: Once | INTRAVENOUS | Status: AC
  Administered 2022-05-18: 2 g via INTRAVENOUS
  Filled 2022-05-18: qty 50

## 2022-05-18 MED ORDER — INSULIN ASPART 100 UNIT/ML IJ SOLN
5.0000 [IU] | Freq: Three times a day (TID) | INTRAMUSCULAR | Status: DC
  Administered 2022-05-18 – 2022-05-22 (×10): 5 [IU] via SUBCUTANEOUS
  Filled 2022-05-18 (×10): qty 1

## 2022-05-18 NOTE — Assessment & Plan Note (Signed)
Will restart diuretics once stable

## 2022-05-18 NOTE — Progress Notes (Signed)
PROGRESS NOTE  Joshua Berry DBZ:[id number] DOB: 09-20-1946   PCP: Lorelei Pont, MD  Patient is from: Home.  Lives with wife.  Reports using walker and cane at baseline.  DOA: 05/14/2022 LOS: 4  Chief complaints Chief Complaint  Patient presents with   Altered Mental Status     Brief Narrative / Interim history: 76 year old M with PMH of liver cirrhosis, portal HTN, GIB/GAVE, diastolic CHF, pancytopenia, OSA, TAVR, poor dentition and chronic right shoulder pain presenting with malaise and altered mental status and admitted to ICU with septic shock due to coagulase-negative staph bacteremia, and AKI requiring vasopressors and IV antibiotics.  BC ID with Staphylococcus species but not aureus or MRSA.  MRSA PCR screen negative.  ID consulted.   Patient was weaned off vasopressor and transferred to [facility] service on 05/16/2022. Repeat blood culture NGTD.  TTE without significant finding or vegetation.  MRI lumbar spine with significant DDD but no discitis or osteomyelitis.  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  ID recommends TEE.  Cardiology consulted.   Subjective: Seen and examined earlier this morning.  No major events overnight of this morning.  He complains of lower back pain.  He rates pain 6/10.  Shoulder pain has improved.  No other complaints.  He is more alert.  He is oriented x4 but falls back asleep at times.  Objective: Vitals:   05/17/22 1928 05/18/22 0413 05/18/22 0500 05/18/22 0813  BP: (!) 116/52 (!) 117/54  (!) 141/60  Pulse: (!) 57 (!) 57  (!) 59  Resp: 16 20  18   Temp: 98.8 F (37.1 C) 98.6 F (37 C)  98.8 F (37.1 C)  TempSrc:  Oral  Oral  SpO2: 97% 96%  96%  Weight:   106.2 kg   Height:        Examination:  GENERAL: No apparent distress.  Nontoxic. HEENT: MMM.  Vision and hearing grossly intact.  NECK: Supple.  No apparent JVD.  RESP:  No IWOB.  Fair aeration bilaterally. CVS:  RRR. Heart sounds normal.  ABD/GI/GU: BS+. Abd  soft, NTND.  MSK/EXT:  Moves extremities. No apparent deformity. No edema.  SKIN: no apparent skin lesion or wound NEURO: Awake and alert. Oriented appropriately.  No apparent focal neuro deficit. PSYCH: Calm. Normal affect.   Procedures:  None  Microbiology summarized: 5/30-COVID-19 and influenza PCR nonreactive. 5/30-MRSA PCR screen negative. 5/30-blood culture Staphylococcus caprae  Assessment and Plan: * Septic shock due to Staphylococcus bacteremia Suspect odontogenic source given poor dentition.  Blood culture with Staphylococcus Caprae.  MRSA PCR screen negative.  Sepsis physiology resolved.  No significant finding on TTE.  Repeat blood cultures NGTD.  MRI lumbar spine with DDD but no suspicion for discitis osteomyelitis. -Infectious disease following. -Vancomycin 5/30>> IV Ancef 6/2>>> -CTX and Zithromax 5/30-5/31. -ID recommends TEE.  Cardiology consulted. -Dental extraction outpatient  AKI (acute kidney injury) ([place]) Recent Labs    04/06/22 1122 04/07/22 0557 05/14/22 1104 05/15/22 0323 05/16/22 0441 05/17/22 0550 05/18/22 0544  BUN 15 15 18 17 15 11 10   CREATININE 1.15 1.11 1.29* 1.26* 0.96 0.80 0.74  Likely due to sepsis.  Resolved. -Avoid or minimize nephrotoxins   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 Looks compensated.  Mild LFT elevation with mild hyperbilirubinemia likely from sepsis.  Ammonia 53>> 71 but mental status better this morning.  Has not a bowel movement yet. -May benefit from Lasix and Aldactone once blood pressure improves. -Continue rifaximin -Increase lactulose to 30 g 3 times  daily. -Monitor LFT and ammonia intermittently  Fall at home, initial encounter Reportedly slipped and fell at home.  No notable injury.  CT head and cervical spine and MRI lumbar spine without acute finding. -Fall precaution -PT/OT  Hypotension Resolved.   -Continue low-dose midodrine  Chronic diastolic CHF (congestive  heart failure) ([place]) TTE in 2020 with LVEF of 55 to 65%.  Seems to be on p.o. Lasix 20 mg daily at home.  Appears euvolemic on exam.  TTE without significant finding. -Hold diuretics given soft blood pressure -Monitor I and O's, daily weight, renal functions and electrolytes   Controlled IDDM-2 with hyperglycemia A1c 6.1% about 1 month ago.  Uses Lantus 16 units daily and NovoLog 10 units 3 times daily Recent Labs  Lab 05/17/22 1153 05/17/22 1724 05/17/22 2040 05/18/22 0814 05/18/22 1145  GLUCAP 254* 166* 173* 164* 262*  -Continue SSI-moderate -Increase semaglutide from 15-20 units at night -Increase NovoLog from 3 to 5 units 3 times daily with meals -Further adjustment as appropriate.   Hyponatremia, hypokalemia and hypomagnesemia Monitor and replenish as appropriate  Acute hepatic encephalopathy (HCC) Ammonia elevated to 53.  Increase lactulose.  Hypomagnesemia Mg 1.6.  IV magnesium sulfate 2 g x 1  Back pain Lumbar MRI with DDD but no concern for discitis or osteomyelitis.  No fracture. -OOB, PT/OT and pain control.  Lactic acidosis Resolved.  Obesity (BMI 30-39.9) Body mass index is 30.57 kg/m.   Physical deconditioning PT/OT  Hypothyroidism Continue home Synthroid.  Right shoulder pain Chronic.  Unchanged per patient. -Tylenol, oxycodone and fentanyl as needed  Pancytopenia (HCC) Likely due to liver cirrhosis.  Leukopenia resolved.  Thrombocytopenia improving.  H&H stable. -Continue monitoring  Portal hypertension ([place]) Will restart diuretics once stable    DVT prophylaxis:  SCDs Start: 05/14/22 1608  Code Status: Full code Family Communication: Updated patient's wife over the phone on 6/2. Level of care: Med-Surg Status is: Inpatient Remains inpatient appropriate because: Staphylococcus bacteremia and further evaluation to rule out endocarditis   Final disposition: TBD Consultants:  Pulmonology admitted patient Infectious  disease Cardiology  Sch Meds:  Scheduled Meds:  Chlorhexidine Gluconate Cloth  6 each Topical Q0600   insulin aspart  0-15 Units Subcutaneous TID WC   insulin aspart  0-5 Units Subcutaneous QHS   insulin aspart  5 Units Subcutaneous TID WC   insulin glargine-yfgn  20 Units Subcutaneous QHS   lactulose  30 g Oral TID   levothyroxine  100 mcg Oral Q0600   lidocaine  1 patch Transdermal Q24H   midodrine  5 mg Oral TID WC   pantoprazole  40 mg Oral Daily   rifaximin  550 mg Oral BID   Continuous Infusions:  sodium chloride 10 mL/hr at 05/18/22 0729    ceFAZolin (ANCEF) IV 2 g (05/18/22 0730)   PRN Meds:.sodium chloride, acetaminophen, oxyCODONE, traMADol  Antimicrobials: Anti-infectives (From admission, onward)    Start     Dose/Rate Route Frequency Ordered Stop   05/17/22 2200  ceFAZolin (ANCEF) IVPB 2g/100 mL premix        2 g 200 mL/hr over 30 Minutes Intravenous Every 8 hours 05/17/22 1424     05/15/22 1400  rifaximin (XIFAXAN) tablet 550 mg        550 mg Oral 2 times daily 05/15/22 1229     05/15/22 1000  vancomycin (VANCOREADY) IVPB 2000 mg/400 mL  Status:  Discontinued        2,000 mg 200 mL/hr over 120 Minutes Intravenous Every 24  hours 05/15/22 0306 05/17/22 1424   05/14/22 2000  cefTRIAXone (ROCEPHIN) 2 g in sodium chloride 0.9 % 100 mL IVPB  Status:  Discontinued        2 g 200 mL/hr over 30 Minutes Intravenous Every 24 hours 05/14/22 1617 05/15/22 0257   05/14/22 1800  azithromycin (ZITHROMAX) 500 mg in sodium chloride 0.9 % 250 mL IVPB  Status:  Discontinued        500 mg 250 mL/hr over 60 Minutes Intravenous Every 24 hours 05/14/22 1617 05/15/22 1512   05/14/22 1330  vancomycin (VANCOREADY) IVPB 1250 mg/250 mL        1,250 mg 166.7 mL/hr over 90 Minutes Intravenous  Once 05/14/22 1220 05/14/22 1435   05/14/22 1215  ceFEPIme (MAXIPIME) 2 g in sodium chloride 0.9 % 100 mL IVPB        2 g 200 mL/hr over 30 Minutes Intravenous  Once 05/14/22 1202 05/14/22 1300    05/14/22 1215  vancomycin (VANCOCIN) IVPB 1000 mg/200 mL premix        1,000 mg 200 mL/hr over 60 Minutes Intravenous  Once 05/14/22 1202 05/14/22 1331        I have personally reviewed the following labs and images: CBC: Recent Labs  Lab 05/14/22 1104 05/15/22 0323 05/16/22 0441 05/17/22 0550  WBC 4.2 3.8* 4.8 4.7  NEUTROABS 3.5  --   --   --   HGB 11.8* 10.1* 9.8* 10.7*  HCT 34.1* 29.0* 28.6* 30.1*  MCV 94.7 92.7 92.9 91.8  PLT 35* 26* 26* 40*   BMP &GFR Recent Labs  Lab 05/14/22 1104 05/14/22 1609 05/15/22 0323 05/16/22 0441 05/17/22 0550 05/18/22 0544  NA 136  --  136 136 135 134*  K 4.0  --  4.0 3.7 3.9 3.4*  CL 104  --  108 107 104 102  CO2 20*  --  23 24 27 26   GLUCOSE 185*  --  193* 167* 155* 145*  BUN 18  --  17 15 11 10   CREATININE 1.29*  --  1.26* 0.96 0.80 0.74  CALCIUM 8.8*  --  8.0* 8.2* 8.1* 8.3*  MG  --  1.7 1.5* 1.7 1.6* 1.6*  PHOS  --  2.8 3.3  --  2.6 3.1   Estimated Creatinine Clearance: 100.4 mL/min (by C-G formula based on SCr of 0.74 mg/dL). Liver & Pancreas: Recent Labs  Lab 05/14/22 1104 05/15/22 0323 05/16/22 0441 05/17/22 0550 05/18/22 0544  AST 76* 92* 80* 78* 87*  ALT 57* 55* 47* 51* 62*  ALKPHOS 86 72 63 72 84  BILITOT 2.3* 1.9* 1.8* 2.2* 2.0*  PROT 6.8 5.6* 5.5* 6.0* 6.0*  ALBUMIN 3.3* 2.7* 2.6* 2.8* 2.7*   No results for input(s): LIPASE, AMYLASE in the last 168 hours. Recent Labs  Lab 05/14/22 1104 05/17/22 0550 05/18/22 0544  AMMONIA 72* 53* 71*   Diabetic: No results for input(s): HGBA1C in the last 72 hours. Recent Labs  Lab 05/17/22 1153 05/17/22 1724 05/17/22 2040 05/18/22 0814 05/18/22 1145  GLUCAP 254* 166* 173* 164* 262*   Cardiac Enzymes: No results for input(s): CKTOTAL, CKMB, CKMBINDEX, TROPONINI in the last 168 hours. No results for input(s): PROBNP in the last 8760 hours. Coagulation Profile: Recent Labs  Lab 05/17/22 0550 05/18/22 0544  INR 1.3* 1.3*   Thyroid Function Tests: No  results for input(s): TSH, T4TOTAL, FREET4, T3FREE, THYROIDAB in the last 72 hours. Lipid Profile: No results for input(s): CHOL, HDL, LDLCALC, TRIG, CHOLHDL, LDLDIRECT in the  last 72 hours. Anemia Panel: No results for input(s): VITAMINB12, FOLATE, FERRITIN, TIBC, IRON, RETICCTPCT in the last 72 hours. Urine analysis:    Component Value Date/Time   COLORURINE YELLOW (A) 05/14/2022 1104   APPEARANCEUR CLEAR (A) 05/14/2022 1104   APPEARANCEUR Haz[place] 05/01/2014 1653   LABSPEC 1.021 05/14/2022 1104   LABSPEC 1.015 05/01/2014 1653   PHURINE 7.0 05/14/2022 1104   GLUCOSEU NEGATIVE 05/14/2022 1104   GLUCOSEU Negative 05/01/2014 [place] 05/14/2022 [place] 05/14/2022 1104   BILIRUBINUR Negative 05/01/2014 [place] 05/14/2022 [place] 05/14/2022 1104   NITRITE NEGATIVE 05/14/2022 [place] 05/14/2022 1104   LEUKOCYTESUR Negative 05/01/2014 1653   Sepsis Labs: Invalid input(s): PROCALCITONIN, [place]  Microbiology: Recent Results (from the past 240 hour(s))  Blood Culture (routine x 2)     Status: Abnormal   Collection Time: 05/14/22 11:06 AM   Specimen: BLOOD  Result Value Ref Range Status   Specimen Description   Final    BLOOD BLOOD RIGHT FOREARM Performed at [facility], [address], [place], [place] [address]    Special Requests   Final    BOTTLES DRAWN AEROBIC AND ANAEROBIC Blood Culture adequate volume Performed at [facility], [address] [place], [place] [address]    Culture  Setup Time   Final    Organism ID to follow GRAM POSITIVE COCCI IN BOTH AEROBIC AND ANAEROBIC BOTTLES CRITICAL RESULT CALLED TO, READ BACK BY AND VERIFIED WITH: NATHAN BELUTH @ [id number] ON 05/15/2022.Marland KitchenMarland KitchenTKR    Culture STAPHYLOCOCCUS CAPRAE (A)  Final   Report Status 05/17/2022 FINAL  Final   Organism ID, Bacteria STAPHYLOCOCCUS CAPRAE  Final      Susceptibility   Staphylococcus caprae  - MIC*    CIPROFLOXACIN 1 SENSITIVE Sensitive     ERYTHROMYCIN <=0.25 SENSITIVE Sensitive     GENTAMICIN <=0.5 SENSITIVE Sensitive     OXACILLIN <=0.25 SENSITIVE Sensitive     TETRACYCLINE <=1 SENSITIVE Sensitive     VANCOMYCIN <=0.5 SENSITIVE Sensitive     TRIMETH/SULFA <=10 SENSITIVE Sensitive     CLINDAMYCIN <=0.25 SENSITIVE Sensitive     RIFAMPIN >=32 RESISTANT Resistant     Inducible Clindamycin NEGATIVE Sensitive     * STAPHYLOCOCCUS CAPRAE  Blood Culture ID Panel (Reflexed)     Status: Abnormal   Collection Time: 05/14/22 11:06 AM  Result Value Ref Range Status   Enterococcus faecalis NOT DETECTED NOT DETECTED Final   Enterococcus Faecium NOT DETECTED NOT DETECTED Final   Listeria monocytogenes NOT DETECTED NOT DETECTED Final   Staphylococcus species DETECTED (A) NOT DETECTED Final    Comment: NATHAN BELUTH @ [phone] ON 05/15/2022.Marland KitchenMarland KitchenTKR   Staphylococcus aureus (BCID) NOT DETECTED NOT DETECTED Final   Staphylococcus epidermidis NOT DETECTED NOT DETECTED Final   Staphylococcus lugdunensis NOT DETECTED NOT DETECTED Final   Streptococcus species NOT DETECTED NOT DETECTED Final   Streptococcus agalactiae NOT DETECTED NOT DETECTED Final   Streptococcus pneumoniae NOT DETECTED NOT DETECTED Final   Streptococcus pyogenes NOT DETECTED NOT DETECTED Final   A.calcoaceticus-baumannii NOT DETECTED NOT DETECTED Final   Bacteroides fragilis NOT DETECTED NOT DETECTED Final   Enterobacterales NOT DETECTED NOT DETECTED Final   Enterobacter cloacae complex NOT DETECTED NOT DETECTED Final   Escherichia coli NOT DETECTED NOT DETECTED Final   Klebsiella aerogenes NOT DETECTED NOT DETECTED Final   Klebsiella oxytoca NOT DETECTED NOT DETECTED Final   Klebsiella pneumoniae NOT DETECTED  NOT DETECTED Final   Proteus species NOT DETECTED NOT DETECTED Final   Salmonella species NOT DETECTED NOT DETECTED Final   Serratia marcescens NOT DETECTED NOT DETECTED Final   Haemophilus influenzae NOT DETECTED NOT  DETECTED Final   Neisseria meningitidis NOT DETECTED NOT DETECTED Final   Pseudomonas aeruginosa NOT DETECTED NOT DETECTED Final   Stenotrophomonas maltophilia NOT DETECTED NOT DETECTED Final   Candida albicans NOT DETECTED NOT DETECTED Final   Candida auris NOT DETECTED NOT DETECTED Final   Candida glabrata NOT DETECTED NOT DETECTED Final   Candida krusei NOT DETECTED NOT DETECTED Final   Candida parapsilosis NOT DETECTED NOT DETECTED Final   Candida tropicalis NOT DETECTED NOT DETECTED Final   Cryptococcus neoformans/gattii NOT DETECTED NOT DETECTED Final    Comment: Performed at [facility], [address] [place], [place] [address]  Blood Culture (routine x 2)     Status: Abnormal   Collection Time: 05/14/22 11:22 AM   Specimen: BLOOD  Result Value Ref Range Status   Specimen Description   Final    BLOOD RIGHT ARM Performed at [facility], [address] [place], [place] [address]    Special Requests   Final    BOTTLES DRAWN AEROBIC AND ANAEROBIC Blood Culture results may not be optimal due to an excessive volume of blood received in culture bottles Performed at [facility], [address] [place], [place] [address]    Culture  Setup Time   Final    GRAM POSITIVE COCCI IN BOTH AEROBIC AND ANAEROBIC BOTTLES CRITICAL VALUE NOTED.  VALUE IS CONSISTENT WITH PREVIOUSLY REPORTED AND CALLED VALUE.    Culture (A)  Final    STAPHYLOCOCCUS CAPRAE SUSCEPTIBILITIES PERFORMED ON PREVIOUS CULTURE WITHIN THE LAST 5 DAYS. Performed at [facility], [place] [address], [place], [place] [address]    Report Status 05/17/2022 FINAL  Final  Resp Panel by RT-PCR (Flu A&B, Covid) Anterior Nasal Swab     Status: None   Collection Time: 05/14/22 12:04 PM   Specimen: Anterior Nasal Swab  Result Value Ref Range Status   SARS Coronavirus 2 by RT PCR NEGATIVE NEGATIVE Final    Comment: (NOTE) SARS-CoV-2 target nucleic acids are NOT DETECTED.  The SARS-CoV-2 RNA  is generally detectable in upper respiratory specimens during the acute phase of infection. The lowest concentration of SARS-CoV-2 viral copies this assay can detect is 138 copies/mL. A negative result does not preclude SARS-Cov-2 infection and should not be used as the sole basis for treatment or other patient management decisions. A negative result may occur with  improper specimen collection/handling, submission of specimen other than nasopharyngeal swab, presence of viral mutation(s) within the areas targeted by this assay, and inadequate number of viral copies(<138 copies/mL). A negative result must be combined with clinical observations, patient history, and epidemiological information. The expected result is Negative.  Fact Sheet for Patients:  EntrepreneurPulse.com.au  Fact Sheet for Healthcare Providers:  IncredibleEmployment.be  This test is no t yet approved or cleared by the [place] FDA and  has been authorized for detection and/or diagnosis of SARS-CoV-2 by FDA under an Emergency Use Authorization (EUA). This EUA will remain  in effect (meaning this test can be used) for the duration of the COVID-19 declaration under Section 564(b)(1) of the Act, 21 U.S.C.section 360bbb-3(b)(1), unless the authorization is terminated  or revoked sooner.       Influenza A by PCR NEGATIVE NEGATIVE Final   Influenza B by PCR  NEGATIVE NEGATIVE Final    Comment: (NOTE) The Xpert Xpress SARS-CoV-2/FLU/RSV plus assay is intended as an aid in the diagnosis of influenza from Nasopharyngeal swab specimens and should not be used as a sole basis for treatment. Nasal washings and aspirates are unacceptable for Xpert Xpress SARS-CoV-2/FLU/RSV testing.  Fact Sheet for Patients: EntrepreneurPulse.com.au  Fact Sheet for Healthcare Providers: IncredibleEmployment.be  This test is not yet approved or cleared by the  [place] FDA and has been authorized for detection and/or diagnosis of SARS-CoV-2 by FDA under an Emergency Use Authorization (EUA). This EUA will remain in effect (meaning this test can be used) for the duration of the COVID-19 declaration under Section 564(b)(1) of the Act, 21 U.S.C. section 360bbb-3(b)(1), unless the authorization is terminated or revoked.  Performed at [facility], [address] [place], [place] [address]   MRSA Next Gen by PCR, Nasal     Status: None   Collection Time: 05/14/22  9:29 PM   Specimen: Nasal Mucosa; Nasal Swab  Result Value Ref Range Status   MRSA by PCR Next Gen NOT DETECTED NOT DETECTED Final    Comment: (NOTE) The GeneXpert MRSA Assay (FDA approved for NASAL specimens only), is one component of a comprehensive MRSA colonization surveillance program. It is not intended to diagnose MRSA infection nor to guide or monitor treatment for MRSA infections. Test performance is not FDA approved in patients less than 33 years old. Performed at [facility], [address] [place], [place] [address]   Culture, blood (Routine X 2) w Reflex to ID Panel     Status: None (Preliminary result)   Collection Time: 05/16/22  3:30 PM   Specimen: BLOOD  Result Value Ref Range Status   Specimen Description BLOOD LAC  Final   Special Requests BOTTLES DRAWN AEROBIC AND ANAEROBIC BCAV  Final   Culture   Final    NO GROWTH 2 DAYS Performed at [facility], [address], [place], [place] [address]    Report Status PENDING  Incomplete  Culture, blood (Routine X 2) w Reflex to ID Panel     Status: None (Preliminary result)   Collection Time: 05/16/22  3:35 PM   Specimen: BLOOD  Result Value Ref Range Status   Specimen Description BLOOD RAC  Final   Special Requests BOTTLES DRAWN AEROBIC AND ANAEROBIC BCAV  Final   Culture   Final    NO GROWTH 2 DAYS Performed at [facility], [address], [place], [place]  [address]    Report Status PENDING  Incomplete    Radiology Studies: MR LUMBAR SPINE WO CONTRAST  Result Date: 05/17/2022 CLINICAL DATA:  Low back pain and fever. EXAM: MRI LUMBAR SPINE WITHOUT CONTRAST TECHNIQUE: Multiplanar, multisequence MR imaging of the lumbar spine was performed. No intravenous contrast was administered. COMPARISON:  CT abdomen 07/21/2019 FINDINGS: Segmentation: The lowest lumbar type non-rib-bearing vertebra is labeled as L5. Alignment:  No vertebral subluxation is observed. Vertebrae: Marrow heterogeneity is present. Although this can be caused by marrow infiltrative processes, the most common causes include anemia, smoking, obesity, or advancing age. Multilevel degenerative endplate findings in lumbar spine with substantial loss of intervertebral disc height at all levels between L1 and S1. There is some heterogeneity of disc signal without compelling findings of lumbar discitis. Chronic endplate compressions at T12, not changed from 07/21/2019. No findings of endplate osteomyelitis. Small Schmorl's node along the inferior endplate of L2. Conus medullaris and cauda equina: Conus extends to the  L1 level. Conus and cauda equina appear normal. Paraspinal and other soft tissues: A fluid signal intensity lesion of the left kidney is partially characterized on today's exam. This is statistically likely to be a cyst but not technically specific. Disc levels: T12-L1: No impingement.  Mild disc bulge. L1-2: Moderate central narrowing of the thecal sac and mild bilateral foraminal stenosis with mild displacement of the right L1 nerve in the lateral extraforaminal space as well as mild bilateral subarticular lateral recess stenosis due to disc osteophyte complex, right inferior foraminal disc protrusion, and degenerative facet arthropathy. L2-3: Mild to moderate left foraminal stenosis with mild left subarticular lateral recess stenosis and borderline central narrowing of the thecal sac due to disc  bulge, intervertebral spurring, and facet arthropathy. L3-4: Prominent central narrowing of the thecal sac with mild bilateral foraminal stenosis and prominent bilateral subarticular lateral recess stenosis due to central disc protrusion, disc bulge, intervertebral spurring, and facet arthropathy. L4-5: Moderate to prominent left and moderate right subarticular lateral recess stenosis and borderline central narrowing of the thecal sac due to disc osteophyte complex, facet arthropathy, and central disc protrusion extending caudad. L5-S1: Mild right foraminal stenosis and borderline bilateral subarticular lateral recess stenosis due to disc osteophyte complex and facet arthropathy. IMPRESSION: 1. No compelling findings of discitis-osteomyelitis at this time. Multilevel degenerative endplate findings are noted. 2. Lumbar spondylosis and degenerative disc disease causing prominent impingement at L3-4; moderate to prominent impingement at L4-5; moderate impingement at L1-2; mild to moderate impingement at L2-3; and mild impingement at L5-S1, as detailed above. 3. Marrow heterogeneity is present. Although this can be caused by marrow infiltrative processes, the most common causes include anemia, smoking, obesity, or advancing age. Electronically Signed   By: Van Clines M.D.   On: 05/17/2022 14:58      Braydyn Schultes T. [place]  If 7PM-7AM, please contact night-coverage [email or web address] 05/18/2022, 1:07 PM

## 2022-05-18 NOTE — Progress Notes (Signed)
PT Cancellation Note  Patient Details Name: Joshua Berry MRN: [id number] DOB: 1946/04/07   Cancelled Treatment:   7255:  Reason Eval/Treat Not Completed: Other (comment) (Pt eating). Will check back as time allows.    [name] [name] 05/18/2022, 12:33 PM

## 2022-05-18 NOTE — Assessment & Plan Note (Signed)
Monitor and replenish as appropriate

## 2022-05-18 NOTE — Progress Notes (Signed)
PT Cancellation Note  Patient Details Name: Joshua Berry MRN: [id number] DOB: 11-Jan-1946   Cancelled Treatment:    1108: Pt with nursing staff. Will follow up as time allows.    Teryn Gust A Martez Weiand 05/18/2022, 12:34 PM

## 2022-05-18 NOTE — Progress Notes (Signed)
    [facility] has been requested to perform a transesophageal echocardiogram on Joshua Berry for bacteremia.  After careful review of history and examination, the risks and benefits of transesophageal echocardiogram have been explained including risks of esophageal damage, perforation (1:10,000 risk), bleeding, pharyngeal hematoma as well as other potential complications associated with conscious sedation including aspiration, arrhythmia, respiratory failure and death. Alternatives to treatment were discussed, questions were answered. Patient is willing to proceed.   Murray Hodgkins, NP  05/18/2022 2:15 PM

## 2022-05-19 DIAGNOSIS — N179 Acute kidney failure, unspecified: Secondary | ICD-10-CM | POA: Diagnosis not present

## 2022-05-19 DIAGNOSIS — A412 Sepsis due to unspecified staphylococcus: Secondary | ICD-10-CM | POA: Diagnosis not present

## 2022-05-19 DIAGNOSIS — I5032 Chronic diastolic (congestive) heart failure: Secondary | ICD-10-CM | POA: Diagnosis not present

## 2022-05-19 DIAGNOSIS — K7460 Unspecified cirrhosis of liver: Secondary | ICD-10-CM | POA: Diagnosis not present

## 2022-05-19 LAB — CBC WITH DIFFERENTIAL/PLATELET
Abs Immature Granulocytes: 0.04 10*3/uL (ref 0.00–0.07)
Basophils Absolute: 0 10*3/uL (ref 0.0–0.1)
Basophils Relative: 1 %
Eosinophils Absolute: 0.2 10*3/uL (ref 0.0–0.5)
Eosinophils Relative: 5 %
HCT: 33.2 % — ABNORMAL LOW (ref 39.0–52.0)
Hemoglobin: 11.8 g/dL — ABNORMAL LOW (ref 13.0–17.0)
Immature Granulocytes: 1 %
Lymphocytes Relative: 19 %
Lymphs Abs: 0.9 10*3/uL (ref 0.7–4.0)
MCH: 33.2 pg (ref 26.0–34.0)
MCHC: 35.5 g/dL (ref 30.0–36.0)
MCV: 93.5 fL (ref 80.0–100.0)
Monocytes Absolute: 0.5 10*3/uL (ref 0.1–1.0)
Monocytes Relative: 11 %
Neutro Abs: 2.8 10*3/uL (ref 1.7–7.7)
Neutrophils Relative %: 63 %
Platelets: 76 10*3/uL — ABNORMAL LOW (ref 150–400)
RBC: 3.55 MIL/uL — ABNORMAL LOW (ref 4.22–5.81)
RDW: 14.6 % (ref 11.5–15.5)
WBC: 4.4 10*3/uL (ref 4.0–10.5)
nRBC: 0 % (ref 0.0–0.2)

## 2022-05-19 LAB — GLUCOSE, CAPILLARY
Glucose-Capillary: 167 mg/dL — ABNORMAL HIGH (ref 70–99)
Glucose-Capillary: 144 mg/dL — ABNORMAL HIGH (ref 70–99)
Glucose-Capillary: 146 mg/dL — ABNORMAL HIGH (ref 70–99)
Glucose-Capillary: 135 mg/dL — ABNORMAL HIGH (ref 70–99)

## 2022-05-19 LAB — HEPATIC FUNCTION PANEL
ALT: 83 U/L — ABNORMAL HIGH (ref 0–44)
AST: 123 U/L — ABNORMAL HIGH (ref 15–41)
Albumin: 2.8 g/dL — ABNORMAL LOW (ref 3.5–5.0)
Alkaline Phosphatase: 106 U/L (ref 38–126)
Bilirubin, Direct: 0.5 mg/dL — ABNORMAL HIGH (ref 0.0–0.2)
Indirect Bilirubin: 1.4 mg/dL — ABNORMAL HIGH (ref 0.3–0.9)
Total Bilirubin: 1.9 mg/dL — ABNORMAL HIGH (ref 0.3–1.2)
Total Protein: 6.4 g/dL — ABNORMAL LOW (ref 6.5–8.1)

## 2022-05-19 LAB — AMMONIA: Ammonia: 26 umol/L (ref 9–35)

## 2022-05-19 NOTE — Plan of Care (Signed)
  Problem: Education: Goal: Knowledge of General Education information will improve Description: Including pain rating scale, medication(s)/side effects and non-pharmacologic comfort measures Outcome: Progressing Problem: Nutrition: Goal: Adequate nutrition will be maintained Outcome: Progressing   Problem: Coping: Goal: Level of anxiety will decrease Outcome: Progressing   Problem: Elimination: Goal: Will not experience complications related to bowel motility Outcome: Progressing Goal: Will not experience complications related to urinary retention Outcome: Progressing   Problem: Pain Managment: Goal: General experience of comfort will improve Outcome: Progressing   Problem: Safety: Goal: Ability to remain free from injury will improve Outcome: Progressing   Problem: Skin Integrity: Goal: Risk for impaired skin integrity will decrease Outcome: Progressing   Problem: Education: Goal: Ability to describe self-care measures that may prevent or decrease complications (Diabetes Survival Skills Education) will improve Outcome: Progressing Goal: Individualized Educational Video(s) Outcome: Progressing   Problem: Coping: Goal: Ability to adjust to condition or change in health will improve Outcome: Progressing   Problem: Fluid Volume: Goal: Ability to maintain a balanced intake and output will improve Outcome: Progressing   Problem: Health Behavior/Discharge Planning: Goal: Ability to identify and utilize available resources and services will improve Outcome: Progressing Goal: Ability to manage health-related needs will improve Outcome: Progressing   Problem: Nutritional: Goal: Maintenance of adequate nutrition will improve Outcome: Progressing Goal: Progress toward achieving an optimal weight will improve Outcome: Progressing   Problem: Skin Integrity: Goal: Risk for impaired skin integrity will decrease Outcome: Progressing   Problem: Tissue Perfusion: Goal:  Adequacy of tissue perfusion will improve Outcome: Progressing   Problem: Clinical Measurements: Goal: Ability to maintain clinical measurements within normal limits will improve Outcome: Progressing Goal: Will remain free from infection Outcome: Progressing Goal: Diagnostic test results will improve Outcome: Progressing Goal: Respiratory complications will improve Outcome: Progressing Goal: Cardiovascular complication will be avoided Outcome: Progressing

## 2022-05-19 NOTE — Progress Notes (Signed)
PROGRESS NOTE  Joshua Berry JME:[id number] DOB: 08/14/1946   PCP: Lorelei Pont, MD  Patient is from: Home.  Lives with wife.  Reports using walker and cane at baseline.  DOA: 05/14/2022 LOS: 5  Chief complaints Chief Complaint  Patient presents with   Altered Mental Status     Brief Narrative / Interim history: 76 year old M with PMH of liver cirrhosis, portal HTN, GIB/GAVE, diastolic CHF, pancytopenia, OSA, TAVR, poor dentition and chronic right shoulder pain presenting with malaise and altered mental status and admitted to ICU with septic shock due to coagulase-negative staph bacteremia, and AKI requiring vasopressors and IV antibiotics.  BC ID with Staphylococcus species but not aureus or MRSA.  MRSA PCR screen negative.  ID consulted.   Patient was weaned off vasopressor and transferred to [facility] hospital[facility] service on 05/16/2022. Repeat blood culture NGTD.  TTE without significant finding or vegetation.  MRI lumbar spine with significant DDD but no discitis or osteomyelitis.  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  ID recommends TEE.  Cardiology consulted.   Subjective: Seen and examined earlier this morning.  Sitting on bedside chair.  No major events overnight of this morning.  No complaints.  He denies improvement in his back pain and shoulder pain.  He is awake but not quite alert.  He is oriented x4 except date.  Objective: Vitals:   05/18/22 2200 05/19/22 0410 05/19/22 0728 05/19/22 1519  BP: 130/68 123/63 (!) 111/53 (!) 100/52  Pulse: 61 67 66 (!) 58  Resp: 18 18 18 20   Temp: 98.9 F (37.2 C) 98.4 F (36.9 C) 98.5 F (36.9 C) 98.5 F (36.9 C)  TempSrc: Oral     SpO2: 98% 98% 96% 96%  Weight:      Height:        Examination:  GENERAL: No apparent distress.  Nontoxic. HEENT: MMM.  Vision and hearing grossly intact.  NECK: Supple.  No apparent JVD.  RESP:  No IWOB.  Fair aeration bilaterally. CVS:  RRR. Heart sounds normal.  ABD/GI/GU: BS+. Abd  soft, NTND.  MSK/EXT:  Moves extremities. No apparent deformity. No edema.  SKIN: no apparent skin lesion or wound NEURO: Awake but not quite alert.  Oriented x4 except date.  Follows commands.  No apparent focal neuro deficit. PSYCH: Calm. Normal affect.   Procedures:  None  Microbiology summarized: 5/30-COVID-19 and influenza PCR nonreactive. 5/30-MRSA PCR screen negative. 5/30-blood culture Staphylococcus caprae  Assessment and Plan: * Septic shock due to Staphylococcus bacteremia Suspect odontogenic source given poor dentition.  Blood culture with Staphylococcus Caprae.  MRSA PCR screen negative.  Sepsis physiology resolved.  No significant finding on TTE.  Repeat blood cultures NGTD.  MRI lumbar spine with DDD but no suspicion for discitis osteomyelitis. -Infectious disease following. -Vancomycin 5/30>> IV Ancef 6/2>>> -CTX and Zithromax 5/30-5/31. -ID recommends TEE.  Cardiology consulted. -Dental extraction outpatient  AKI (acute kidney injury) ([place]) Recent Labs    04/06/22 1122 04/07/22 0557 05/14/22 1104 05/15/22 0323 05/16/22 0441 05/17/22 0550 05/18/22 0544  BUN 15 15 18 17 15 11 10   CREATININE 1.15 1.11 1.29* 1.26* 0.96 0.80 0.74  Likely due to sepsis.  Resolved. -Avoid or minimize nephrotoxins   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 Looks compensated.  Mild LFT elevation with mild hyperbilirubinemia likely from sepsis.  Ammonia 53>> 71> 26 but he seems to be sleepier this morning.  Has not a bowel movement yet. -Continue rifaximin -Continue lactulose to 30 g 3 times daily. -Discontinue tramadol  and oxycodone -Monitor LFT and ammonia intermittently -Consider Lasix and Aldactone prior to discharge  Fall at home, initial encounter Reportedly slipped and fell at home.  No notable injury.  CT head and cervical spine and MRI lumbar spine without acute finding. -Fall precaution -PT/OT  Hypotension Resolved.   -Continue  low-dose midodrine  Chronic diastolic CHF (congestive heart failure) ([place]) TTE in 2020 with LVEF of 55 to 65%.  Seems to be on p.o. Lasix 20 mg daily at home.  Appears euvolemic on exam.  TTE without significant finding. -Hold diuretics given soft blood pressure -Monitor I and O's, daily weight, renal functions and electrolytes   Controlled IDDM-2 with hyperglycemia A1c 6.1% about 1 month ago.  Uses Lantus 16 units daily and NovoLog 10 units 3 times daily Recent Labs  Lab 05/18/22 1145 05/18/22 1621 05/19/22 0725 05/19/22 1046 05/19/22 1138  GLUCAP 262* 251* 167* 144* 146*  -Continue SSI-moderate -Continue Semglee 20 units at night -Continue NovoLog 5 units 3 times daily with meals -Further adjustment as appropriate.   Hyponatremia, hypokalemia and hypomagnesemia Monitor and replenish as appropriate  Acute hepatic encephalopathy (HCC) Ammonia level improved but he looks sleepy at this morning. -Continue lactulose -Discontinue tramadol and oxycodone.  Hypomagnesemia Mg 1.6.  IV magnesium sulfate 2 g x 1  Back pain Lumbar MRI with DDD but no concern for discitis or osteomyelitis.  No fracture. -OOB, PT/OT and pain control.  Lactic acidosis Resolved.  Obesity (BMI 30-39.9) Body mass index is 30.57 kg/m.   Physical deconditioning PT/OT  Hypothyroidism Continue home Synthroid.  Right shoulder pain Chronic.  Unchanged per patient.  Seems to have improved. -Tylenol as needed -Discontinue tramadol and oxycodone.  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  Leukopenia resolved.  Thrombocytopenia improving.  H&H stable. -Continue monitoring  Portal hypertension ([place]) Will restart diuretics once stable    DVT prophylaxis:  SCDs Start: 05/14/22 1608  Code Status: Full code Family Communication: None at bedside. Level of care: Med-Surg Status is: Inpatient Remains inpatient appropriate because: Staphylococcus  bacteremia and further evaluation to rule out endocarditis   Final disposition: SNF Consultants:  Pulmonology admitted patient Infectious disease Cardiology  Sch Meds:  Scheduled Meds:  Chlorhexidine Gluconate Cloth  6 each Topical Q0600   insulin aspart  0-15 Units Subcutaneous TID WC   insulin aspart  0-5 Units Subcutaneous QHS   insulin aspart  5 Units Subcutaneous TID WC   insulin glargine-yfgn  20 Units Subcutaneous QHS   lactulose  30 g Oral TID   levothyroxine  100 mcg Oral Q0600   lidocaine  1 patch Transdermal Q24H   midodrine  5 mg Oral TID WC   pantoprazole  40 mg Oral Daily   rifaximin  550 mg Oral BID   Continuous Infusions:  sodium chloride 10 mL/hr at 05/18/22 0729    ceFAZolin (ANCEF) IV 2 g (05/19/22 1558)   PRN Meds:.sodium chloride, acetaminophen, ketotifen, polyvinyl alcohol  Antimicrobials: Anti-infectives (From admission, onward)    Start     Dose/Rate Route Frequency Ordered Stop   05/17/22 2200  ceFAZolin (ANCEF) IVPB 2g/100 mL premix        2 g 200 mL/hr over 30 Minutes Intravenous Every 8 hours 05/17/22 1424     05/15/22 1400  rifaximin (XIFAXAN) tablet 550 mg        550 mg Oral 2 times daily 05/15/22 1229     05/15/22 1000  vancomycin (VANCOREADY) IVPB 2000  mg/400 mL  Status:  Discontinued        2,000 mg 200 mL/hr over 120 Minutes Intravenous Every 24 hours 05/15/22 0306 05/17/22 1424   05/14/22 2000  cefTRIAXone (ROCEPHIN) 2 g in sodium chloride 0.9 % 100 mL IVPB  Status:  Discontinued        2 g 200 mL/hr over 30 Minutes Intravenous Every 24 hours 05/14/22 1617 05/15/22 0257   05/14/22 1800  azithromycin (ZITHROMAX) 500 mg in sodium chloride 0.9 % 250 mL IVPB  Status:  Discontinued        500 mg 250 mL/hr over 60 Minutes Intravenous Every 24 hours 05/14/22 1617 05/15/22 1512   05/14/22 1330  vancomycin (VANCOREADY) IVPB 1250 mg/250 mL        1,250 mg 166.7 mL/hr over 90 Minutes Intravenous  Once 05/14/22 1220 05/14/22 1435   05/14/22  1215  ceFEPIme (MAXIPIME) 2 g in sodium chloride 0.9 % 100 mL IVPB        2 g 200 mL/hr over 30 Minutes Intravenous  Once 05/14/22 1202 05/14/22 1300   05/14/22 1215  vancomycin (VANCOCIN) IVPB 1000 mg/200 mL premix        1,000 mg 200 mL/hr over 60 Minutes Intravenous  Once 05/14/22 1202 05/14/22 1331        I have personally reviewed the following labs and images: CBC: Recent Labs  Lab 05/14/22 1104 05/15/22 0323 05/16/22 0441 05/17/22 0550 05/19/22 0348  WBC 4.2 3.8* 4.8 4.7 4.4  NEUTROABS 3.5  --   --   --  2.8  HGB 11.8* 10.1* 9.8* 10.7* 11.8*  HCT 34.1* 29.0* 28.6* 30.1* 33.2*  MCV 94.7 92.7 92.9 91.8 93.5  PLT 35* 26* 26* 40* 76*   BMP &GFR Recent Labs  Lab 05/14/22 1104 05/14/22 1609 05/15/22 0323 05/16/22 0441 05/17/22 0550 05/18/22 0544  NA 136  --  136 136 135 134*  K 4.0  --  4.0 3.7 3.9 3.4*  CL 104  --  108 107 104 102  CO2 20*  --  23 24 27 26   GLUCOSE 185*  --  193* 167* 155* 145*  BUN 18  --  17 15 11 10   CREATININE 1.29*  --  1.26* 0.96 0.80 0.74  CALCIUM 8.8*  --  8.0* 8.2* 8.1* 8.3*  MG  --  1.7 1.5* 1.7 1.6* 1.6*  PHOS  --  2.8 3.3  --  2.6 3.1   Estimated Creatinine Clearance: 100.4 mL/min (by C-G formula based on SCr of 0.74 mg/dL). Liver & Pancreas: Recent Labs  Lab 05/15/22 0323 05/16/22 0441 05/17/22 0550 05/18/22 0544 05/19/22 0348  AST 92* 80* 78* 87* 123*  ALT 55* 47* 51* 62* 83*  ALKPHOS 72 63 72 84 106  BILITOT 1.9* 1.8* 2.2* 2.0* 1.9*  PROT 5.6* 5.5* 6.0* 6.0* 6.4*  ALBUMIN 2.7* 2.6* 2.8* 2.7* 2.8*   No results for input(s): LIPASE, AMYLASE in the last 168 hours. Recent Labs  Lab 05/14/22 1104 05/17/22 0550 05/18/22 0544 05/19/22 0348  AMMONIA 72* 53* 71* 26   Diabetic: No results for input(s): HGBA1C in the last 72 hours. Recent Labs  Lab 05/18/22 1145 05/18/22 1621 05/19/22 0725 05/19/22 1046 05/19/22 1138  GLUCAP 262* 251* 167* 144* 146*   Cardiac Enzymes: No results for input(s): CKTOTAL, CKMB,  CKMBINDEX, TROPONINI in the last 168 hours. No results for input(s): PROBNP in the last 8760 hours. Coagulation Profile: Recent Labs  Lab 05/17/22 0550 05/18/22 0544  INR 1.3*  1.3*   Thyroid Function Tests: No results for input(s): TSH, T4TOTAL, FREET4, T3FREE, THYROIDAB in the last 72 hours. Lipid Profile: No results for input(s): CHOL, HDL, LDLCALC, TRIG, CHOLHDL, LDLDIRECT in the last 72 hours. Anemia Panel: No results for input(s): VITAMINB12, FOLATE, FERRITIN, TIBC, IRON, RETICCTPCT in the last 72 hours. Urine analysis:    Component Value Date/Time   COLORURINE YELLOW (A) 05/14/2022 1104   APPEARANCEUR CLEAR (A) 05/14/2022 1104   APPEARANCEUR [place] 05/01/2014 1653   LABSPEC 1.021 05/14/2022 1104   LABSPEC 1.015 05/01/2014 1653   PHURINE 7.0 05/14/2022 1104   GLUCOSEU NEGATIVE 05/14/2022 1104   GLUCOSEU Negative 05/01/2014 [place] 05/14/2022 [place] 05/14/2022 1104   BILIRUBINUR Negative 05/01/2014 [place] 05/14/2022 [place] 05/14/2022 1104   NITRITE NEGATIVE 05/14/2022 [place] 05/14/2022 1104   LEUKOCYTESUR Negative 05/01/2014 1653   Sepsis Labs: Invalid input(s): PROCALCITONIN, [place]  Microbiology: Recent Results (from the past 240 hour(s))  Blood Culture (routine x 2)     Status: Abnormal   Collection Time: 05/14/22 11:06 AM   Specimen: BLOOD  Result Value Ref Range Status   Specimen Description   Final    BLOOD BLOOD RIGHT FOREARM Performed at [facility], [address] [place], [place] [address]    Special Requests   Final    BOTTLES DRAWN AEROBIC AND ANAEROBIC Blood Culture adequate volume Performed at [facility], [address] [place], [place] [address]    Culture  Setup Time   Final    Organism ID to follow GRAM POSITIVE COCCI IN BOTH AEROBIC AND ANAEROBIC BOTTLES CRITICAL RESULT CALLED TO, READ BACK BY AND VERIFIED WITH: NATHAN  BELUTH @ 2620 ON 05/15/2022.Marland KitchenMarland KitchenTKR    Culture STAPHYLOCOCCUS CAPRAE (A)  Final   Report Status 05/17/2022 FINAL  Final   Organism ID, Bacteria STAPHYLOCOCCUS CAPRAE  Final      Susceptibility   Staphylococcus caprae - MIC*    CIPROFLOXACIN 1 SENSITIVE Sensitive     ERYTHROMYCIN <=0.25 SENSITIVE Sensitive     GENTAMICIN <=0.5 SENSITIVE Sensitive     OXACILLIN <=0.25 SENSITIVE Sensitive     TETRACYCLINE <=1 SENSITIVE Sensitive     VANCOMYCIN <=0.5 SENSITIVE Sensitive     TRIMETH/SULFA <=10 SENSITIVE Sensitive     CLINDAMYCIN <=0.25 SENSITIVE Sensitive     RIFAMPIN >=32 RESISTANT Resistant     Inducible Clindamycin NEGATIVE Sensitive     * STAPHYLOCOCCUS CAPRAE  Blood Culture ID Panel (Reflexed)     Status: Abnormal   Collection Time: 05/14/22 11:06 AM  Result Value Ref Range Status   Enterococcus faecalis NOT DETECTED NOT DETECTED Final   Enterococcus Faecium NOT DETECTED NOT DETECTED Final   Listeria monocytogenes NOT DETECTED NOT DETECTED Final   Staphylococcus species DETECTED (A) NOT DETECTED Final    Comment: NATHAN BELUTH @ 3559 ON 05/15/2022.Marland KitchenMarland KitchenTKR   Staphylococcus aureus (BCID) NOT DETECTED NOT DETECTED Final   Staphylococcus epidermidis NOT DETECTED NOT DETECTED Final   Staphylococcus lugdunensis NOT DETECTED NOT DETECTED Final   Streptococcus species NOT DETECTED NOT DETECTED Final   Streptococcus agalactiae NOT DETECTED NOT DETECTED Final   Streptococcus pneumoniae NOT DETECTED NOT DETECTED Final   Streptococcus pyogenes NOT DETECTED NOT DETECTED Final   A.calcoaceticus-baumannii NOT DETECTED NOT DETECTED Final   Bacteroides fragilis NOT DETECTED NOT DETECTED Final   Enterobacterales NOT DETECTED NOT DETECTED Final   Enterobacter cloacae complex NOT DETECTED NOT DETECTED  Final   Escherichia coli NOT DETECTED NOT DETECTED Final   Klebsiella aerogenes NOT DETECTED NOT DETECTED Final   Klebsiella oxytoca NOT DETECTED NOT DETECTED Final   Klebsiella pneumoniae NOT DETECTED  NOT DETECTED Final   Proteus species NOT DETECTED NOT DETECTED Final   Salmonella species NOT DETECTED NOT DETECTED Final   Serratia marcescens NOT DETECTED NOT DETECTED Final   Haemophilus influenzae NOT DETECTED NOT DETECTED Final   Neisseria meningitidis NOT DETECTED NOT DETECTED Final   Pseudomonas aeruginosa NOT DETECTED NOT DETECTED Final   Stenotrophomonas maltophilia NOT DETECTED NOT DETECTED Final   Candida albicans NOT DETECTED NOT DETECTED Final   Candida auris NOT DETECTED NOT DETECTED Final   Candida glabrata NOT DETECTED NOT DETECTED Final   Candida krusei NOT DETECTED NOT DETECTED Final   Candida parapsilosis NOT DETECTED NOT DETECTED Final   Candida tropicalis NOT DETECTED NOT DETECTED Final   Cryptococcus neoformans/gattii NOT DETECTED NOT DETECTED Final    Comment: Performed at [facility], [address] [place], [place] [address]  Blood Culture (routine x 2)     Status: Abnormal   Collection Time: 05/14/22 11:22 AM   Specimen: BLOOD  Result Value Ref Range Status   Specimen Description   Final    BLOOD RIGHT ARM Performed at [facility], [address] [place], [place] [address]    Special Requests   Final    BOTTLES DRAWN AEROBIC AND ANAEROBIC Blood Culture results may not be optimal due to an excessive volume of blood received in culture bottles Performed at [facility], [address] [place], [place] [address]    Culture  Setup Time   Final    GRAM POSITIVE COCCI IN BOTH AEROBIC AND ANAEROBIC BOTTLES CRITICAL VALUE NOTED.  VALUE IS CONSISTENT WITH PREVIOUSLY REPORTED AND CALLED VALUE.    Culture (A)  Final    STAPHYLOCOCCUS CAPRAE SUSCEPTIBILITIES PERFORMED ON PREVIOUS CULTURE WITHIN THE LAST 5 DAYS. Performed at [facility], [place] [address] [place], [place] [address]    Report Status 05/17/2022 FINAL  Final  Resp Panel by RT-PCR (Flu A&B, Covid) Anterior Nasal Swab     Status: None   Collection Time: 05/14/22  12:04 PM   Specimen: Anterior Nasal Swab  Result Value Ref Range Status   SARS Coronavirus 2 by RT PCR NEGATIVE NEGATIVE Final    Comment: (NOTE) SARS-CoV-2 target nucleic acids are NOT DETECTED.  The SARS-CoV-2 RNA is generally detectable in upper respiratory specimens during the acute phase of infection. The lowest concentration of SARS-CoV-2 viral copies this assay can detect is 138 copies/mL. A negative result does not preclude SARS-Cov-2 infection and should not be used as the sole basis for treatment or other patient management decisions. A negative result may occur with  improper specimen collection/handling, submission of specimen other than nasopharyngeal swab, presence of viral mutation(s) within the areas targeted by this assay, and inadequate number of viral copies(<138 copies/mL). A negative result must be combined with clinical observations, patient history, and epidemiological information. The expected result is Negative.  Fact Sheet for Patients:  EntrepreneurPulse.com.au  Fact Sheet for Healthcare Providers:  IncredibleEmployment.be  This test is no t yet approved or cleared by the [place] FDA and  has been authorized for detection and/or diagnosis of SARS-CoV-2 by FDA under an Emergency Use Authorization (EUA). This EUA will remain  in effect (meaning this test can be used) for the duration of the COVID-19 declaration under Section 564(b)(1)  of the Act, 21 U.S.C.section 360bbb-3(b)(1), unless the authorization is terminated  or revoked sooner.       Influenza A by PCR NEGATIVE NEGATIVE Final   Influenza B by PCR NEGATIVE NEGATIVE Final    Comment: (NOTE) The Xpert Xpress SARS-CoV-2/FLU/RSV plus assay is intended as an aid in the diagnosis of influenza from Nasopharyngeal swab specimens and should not be used as a sole basis for treatment. Nasal washings and aspirates are unacceptable for Xpert Xpress  SARS-CoV-2/FLU/RSV testing.  Fact Sheet for Patients: EntrepreneurPulse.com.au  Fact Sheet for Healthcare Providers: IncredibleEmployment.be  This test is not yet approved or cleared by the [place] FDA and has been authorized for detection and/or diagnosis of SARS-CoV-2 by FDA under an Emergency Use Authorization (EUA). This EUA will remain in effect (meaning this test can be used) for the duration of the COVID-19 declaration under Section 564(b)(1) of the Act, 21 U.S.C. section 360bbb-3(b)(1), unless the authorization is terminated or revoked.  Performed at [facility], [address] [place], [place] [address]   MRSA Next Gen by PCR, Nasal     Status: None   Collection Time: 05/14/22  9:29 PM   Specimen: Nasal Mucosa; Nasal Swab  Result Value Ref Range Status   MRSA by PCR Next Gen NOT DETECTED NOT DETECTED Final    Comment: (NOTE) The GeneXpert MRSA Assay (FDA approved for NASAL specimens only), is one component of a comprehensive MRSA colonization surveillance program. It is not intended to diagnose MRSA infection nor to guide or monitor treatment for MRSA infections. Test performance is not FDA approved in patients less than 47 years old. Performed at [facility], [address] [place], [place] [address]   Culture, blood (Routine X 2) w Reflex to ID Panel     Status: None (Preliminary result)   Collection Time: 05/16/22  3:30 PM   Specimen: BLOOD  Result Value Ref Range Status   Specimen Description BLOOD LAC  Final   Special Requests BOTTLES DRAWN AEROBIC AND ANAEROBIC BCAV  Final   Culture   Final    NO GROWTH 3 DAYS Performed at [facility], [address], [place], [place] [address]    Report Status PENDING  Incomplete  Culture, blood (Routine X 2) w Reflex to ID Panel     Status: None (Preliminary result)   Collection Time: 05/16/22  3:35 PM   Specimen: BLOOD  Result Value Ref Range  Status   Specimen Description BLOOD RAC  Final   Special Requests BOTTLES DRAWN AEROBIC AND ANAEROBIC BCAV  Final   Culture   Final    NO GROWTH 3 DAYS Performed at [facility], [address] [place], [place] [address]    Report Status PENDING  Incomplete    Radiology Studies: No results found.    Nicandro Perrault T. Jasper  If 7PM-7AM, please contact night-coverage [email or web address] 05/19/2022, 4:51 PM

## 2022-05-19 NOTE — Progress Notes (Signed)
Mobility Specialist - Progress Note   05/19/22 1600  Mobility  Activity Transferred from chair to bed  Level of Assistance Minimal assist, patient does 75% or more  Assistive Device Front wheel walker  Distance Ambulated (ft) 3 ft  Activity Response Tolerated well  $Mobility charge 1 Mobility     Pt in recliner upon arrival using RA. Completes C-B transfer with MinA -- less pain voiced this session. [place] for LE to assist to get back into bed. Able to slide towards [facility] ModI to reposition in bed. Pt left with alarm set and RN Sam present.  Merrily Brittle Mobility Specialist 05/19/22, 4:48 PM

## 2022-05-19 NOTE — Progress Notes (Signed)
Mobility Specialist - Progress Note    05/19/22 1100  Mobility  Activity Transferred from bed to chair;Stood at bedside;Dangled on edge of bed;Ambulated with assistance in room  Level of Assistance Minimal assist, patient does 75% or more  Assistive Device Front wheel walker  Distance Ambulated (ft) 6 ft  Activity Response Tolerated well  $Mobility charge 1 Mobility    Pt semi supine upon arrival using RA. Complains of 11/10 back pain that limits his mobility throughout session. Completes bed mobility ModA + extra time due to pain --- vc throughout. Pt completes STS MinA from elevated bed level. Ambulates 31f to complete transfer --- heavy vc to initiate descend to chair. Pt is left in recliner with needs in reach, alarm set and RN Sam notified.  MMerrily BrittleMobility Specialist 05/19/22, 11:27 AM

## 2022-05-20 DIAGNOSIS — R4182 Altered mental status, unspecified: Secondary | ICD-10-CM | POA: Diagnosis not present

## 2022-05-20 DIAGNOSIS — N179 Acute kidney failure, unspecified: Secondary | ICD-10-CM | POA: Diagnosis not present

## 2022-05-20 DIAGNOSIS — A412 Sepsis due to unspecified staphylococcus: Secondary | ICD-10-CM | POA: Diagnosis not present

## 2022-05-20 DIAGNOSIS — K7682 Hepatic encephalopathy: Secondary | ICD-10-CM | POA: Diagnosis not present

## 2022-05-20 DIAGNOSIS — R6521 Severe sepsis with septic shock: Secondary | ICD-10-CM | POA: Diagnosis not present

## 2022-05-20 DIAGNOSIS — R7881 Bacteremia: Secondary | ICD-10-CM | POA: Diagnosis not present

## 2022-05-20 LAB — COMPREHENSIVE METABOLIC PANEL
ALT: 53 U/L — ABNORMAL HIGH (ref 0–44)
AST: 80 U/L — ABNORMAL HIGH (ref 15–41)
Albumin: 2.7 g/dL — ABNORMAL LOW (ref 3.5–5.0)
Alkaline Phosphatase: 99 U/L (ref 38–126)
Anion gap: 8 (ref 5–15)
BUN: 8 mg/dL (ref 8–23)
CO2: 23 mmol/L (ref 22–32)
Calcium: 8.6 mg/dL — ABNORMAL LOW (ref 8.9–10.3)
Chloride: 103 mmol/L (ref 98–111)
Creatinine, Ser: 0.76 mg/dL (ref 0.61–1.24)
GFR, Estimated: >60 mL/min (ref >60)
Glucose, Bld: 158 mg/dL — ABNORMAL HIGH (ref 70–99)
Potassium: 3.9 mmol/L (ref 3.5–5.1)
Sodium: 134 mmol/L — ABNORMAL LOW (ref 135–145)
Total Bilirubin: 1.7 mg/dL — ABNORMAL HIGH (ref 0.3–1.2)
Total Protein: 6.1 g/dL — ABNORMAL LOW (ref 6.5–8.1)

## 2022-05-20 LAB — MAGNESIUM: Magnesium: 1.5 mg/dL — ABNORMAL LOW (ref 1.7–2.4)

## 2022-05-20 LAB — GLUCOSE, CAPILLARY
Glucose-Capillary: 236 mg/dL — ABNORMAL HIGH (ref 70–99)
Glucose-Capillary: 170 mg/dL — ABNORMAL HIGH (ref 70–99)
Glucose-Capillary: 142 mg/dL — ABNORMAL HIGH (ref 70–99)
Glucose-Capillary: 176 mg/dL — ABNORMAL HIGH (ref 70–99)
Glucose-Capillary: 213 mg/dL — ABNORMAL HIGH (ref 70–99)

## 2022-05-20 LAB — PHOSPHORUS: Phosphorus: 3.2 mg/dL (ref 2.5–4.6)

## 2022-05-20 LAB — TSH: TSH: 2.477 u[IU]/mL (ref 0.350–4.500)

## 2022-05-20 LAB — AMMONIA: Ammonia: 42 umol/L — ABNORMAL HIGH (ref 9–35)

## 2022-05-20 MED ORDER — KETOTIFEN FUMARATE 0.025 % OP SOLN
1.0000 [drp] | Freq: Two times a day (BID) | OPHTHALMIC | Status: DC
Start: 2022-05-20 — End: 2022-05-20

## 2022-05-20 MED ORDER — TERAZOSIN HCL 5 MG PO CAPS
5.0000 mg | ORAL_CAPSULE | Freq: Every day | ORAL | Status: DC
  Administered 2022-05-20 – 2022-05-27 (×8): 5 mg via ORAL
  Filled 2022-05-20 (×8): qty 1

## 2022-05-20 MED ORDER — SODIUM CHLORIDE 0.9 % IV SOLN: INTRAVENOUS | Status: DC

## 2022-05-20 MED ORDER — FUROSEMIDE 20 MG PO TABS
20.0000 mg | ORAL_TABLET | Freq: Every day | ORAL | Status: DC
  Administered 2022-05-20 – 2022-05-21 (×2): 20 mg via ORAL
  Filled 2022-05-20 (×2): qty 1

## 2022-05-20 MED ORDER — MAGNESIUM SULFATE 2 GM/50ML IV SOLN
2.0000 g | Freq: Once | INTRAVENOUS | Status: AC
  Administered 2022-05-20: 2 g via INTRAVENOUS
  Filled 2022-05-20: qty 50

## 2022-05-20 MED ORDER — LACTULOSE 10 GM/15ML PO SOLN
40.0000 g | Freq: Three times a day (TID) | ORAL | Status: DC
  Administered 2022-05-20 – 2022-05-28 (×21): 40 g via ORAL
  Filled 2022-05-20 (×23): qty 60

## 2022-05-20 MED ORDER — ATORVASTATIN CALCIUM 20 MG PO TABS
20.0000 mg | ORAL_TABLET | Freq: Every day | ORAL | Status: DC
  Administered 2022-05-20 – 2022-05-27 (×8): 20 mg via ORAL
  Filled 2022-05-20 (×8): qty 1

## 2022-05-20 MED ORDER — ASPIRIN 81 MG PO TBEC
81.0000 mg | DELAYED_RELEASE_TABLET | Freq: Every day | ORAL | Status: DC
  Administered 2022-05-20 – 2022-05-28 (×9): 81 mg via ORAL
  Filled 2022-05-20 (×9): qty 1

## 2022-05-20 NOTE — Progress Notes (Signed)
Wife of the patient called and had several questions for the Dr and the [facility] team. She is okay waiting until tomorrow for a response. She is aware the some questions might not be able to be answered until after the TEE scheduled for 05/21/22 is complete. Secure chat message sent to Dr Si Raider and [facility]

## 2022-05-20 NOTE — Progress Notes (Signed)
Physical Therapy Treatment Patient Details Name: TRAVAUGHN VUE MRN: [id number] DOB: 1946-10-24 Today's Date: 05/20/2022   History of Present Illness Pt is a 76 y/o M who presented with c/o malaise & AMS. Vital signs showed shock physiology. PMH: DM, Liver cirrhosis, dCHF, essential HTN, dyslipidemia, arthritis, heart murmur, NASH, sleep apnea    PT Comments    Pt received in bathroom. Required ModA to raise from low toilet. Gait training with RW ~31f with MinA at times for safety. Pt required increased time to maneuver walker in small places. Currently not at baseline LOF and agreeable to SNF placement once medically stable. Pt's wife has a facility in mind for d/c.  Continue PT per POC.   Recommendations for follow up therapy are one component of a multi-disciplinary discharge planning process, led by the attending physician.  Recommendations may be updated based on patient status, additional functional criteria and insurance authorization.  Follow Up Recommendations  Skilled nursing-short term rehab (<3 hours/day)     Assistance Recommended at Discharge Frequent or constant Supervision/Assistance  Patient can return home with the following A little help with walking and/or transfers;A lot of help with bathing/dressing/bathroom;Assistance with cooking/housework;Direct supervision/assist for medications management;Help with stairs or ramp for entrance   Equipment Recommendations  Rolling walker (2 wheels)    Recommendations for Other Services       Precautions / Restrictions Precautions Precautions: Fall Restrictions Weight Bearing Restrictions: No     Mobility  Bed Mobility Overal bed mobility: Needs Assistance Bed Mobility: Supine to Sit, Sit to Supine     Supine to sit: Min assist, HOB elevated Sit to supine: Min assist   General bed mobility comments: use of bed rails, extra time    Transfers Overall transfer level: Needs assistance Equipment used: Rolling walker  (2 wheels) Transfers: Sit to/from Stand Sit to Stand: Min guard           General transfer comment: STS from low toilet with ModA    Ambulation/Gait Ambulation/Gait assistance: Min guard, Min assist Gait Distance (Feet): 35 Feet Assistive device: Rolling walker (2 wheels) Gait Pattern/deviations: Step-to pattern, Trunk flexed, Wide base of support Gait velocity: decreased     General Gait Details: Decreased foot clearance, high fall risk   Stairs             Wheelchair Mobility    Modified Rankin (Stroke Patients Only)       Balance Overall balance assessment: Needs assistance Sitting-balance support: Feet supported, Bilateral upper extremity supported Sitting balance-Leahy Scale: Fair Sitting balance - Comments: supervision static sitting   Standing balance support: During functional activity, Bilateral upper extremity supported Standing balance-Leahy Scale: Fair                              Cognition Arousal/Alertness: Awake/alert Behavior During Therapy: WFL for tasks assessed/performed Overall Cognitive Status: Within Functional Limits for tasks assessed                                          Exercises General Exercises - Lower Extremity Ankle Circles/Pumps: AROM, Both, 10 reps Long Arc Quad: AROM, Strengthening, Both, 10 reps, Seated Hip Flexion/Marching: AROM, Strengthening, Both, 5 reps, Seated    General Comments        Pertinent Vitals/Pain Pain Assessment Pain Assessment: 0-10 Pain Score: 5  Pain Location:  low back, R shoulder with flexion Pain Descriptors / Indicators: Grimacing, Discomfort Pain Intervention(s): Monitored during session, Patient requesting pain meds-RN notified    Home Living                          Prior Function            PT Goals (current goals can now be found in the care plan section) Acute Rehab PT Goals Patient Stated Goal: get better    Frequency    Min  2X/week      PT Plan      Co-evaluation              AM-PAC PT "6 Clicks" Mobility   Outcome Measure  Help needed turning from your back to your side while in a flat bed without using bedrails?: A Little Help needed moving from lying on your back to sitting on the side of a flat bed without using bedrails?: A Lot Help needed moving to and from a bed to a chair (including a wheelchair)?: A Little Help needed standing up from a chair using your arms (e.g., wheelchair or bedside chair)?: A Little Help needed to walk in hospital room?: A Little Help needed climbing 3-5 steps with a railing? : A Lot 6 Click Score: 16    End of Session Equipment Utilized During Treatment: Gait belt Activity Tolerance: Patient tolerated treatment well Patient left: in bed;with bed alarm set Nurse Communication: Mobility status PT Visit Diagnosis: Unsteadiness on feet (R26.81);Muscle weakness (generalized) (M62.81);Difficulty in walking, not elsewhere classified (R26.2)     Time: 0037-0488 PT Time Calculation (min) (ACUTE ONLY): 32 min  Charges:  $Gait Training: 8-22 mins $Therapeutic Exercise: 8-22 mins                    Mikel Cella, PTA   Josie Dixon 05/20/2022, 2:00 PM

## 2022-05-20 NOTE — Progress Notes (Addendum)
PROGRESS NOTE  Joshua ISAACSON ZOX:[id number] DOB: 12-28-1945   PCP: Lorelei Pont, MD  Patient is from: Home.  Lives with wife.  Reports using walker and cane at baseline.  DOA: 05/14/2022 LOS: 6  Chief complaints Chief Complaint  Patient presents with   Altered Mental Status     Brief Narrative / Interim history: 76 year old M with PMH of liver cirrhosis, portal HTN, GIB/GAVE, diastolic CHF, pancytopenia, OSA, TAVR, poor dentition and chronic right shoulder pain presenting with malaise and altered mental status and admitted to ICU with septic shock due to coagulase-negative staph bacteremia, and AKI requiring vasopressors and IV antibiotics.  BC ID with Staphylococcus species but not aureus or MRSA.  MRSA PCR screen negative.  ID consulted.    Patient was weaned off vasopressor and transferred to [facility] on 05/16/2022. Repeat blood culture NGTD.  TTE without significant finding or vegetation.  MRI lumbar spine with significant DDD but no discitis or osteomyelitis.  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  ID recommends TEE.  Cardiology consulted.   Subjective: Seen and examined earlier this morning.  Sitting in bed. No complaints. Tolerating diet.  Objective: Vitals:   05/19/22 0728 05/19/22 1519 05/19/22 2013 05/20/22 0826  BP: (!) 111/53 (!) 100/52 105/60 129/72  Pulse: 66 (!) 58 (!) 56 67  Resp: 18 20 16 18   Temp: 98.5 F (36.9 C) 98.5 F (36.9 C) 98.5 F (36.9 C) 98.6 F (37 C)  TempSrc:      SpO2: 96% 96% 97% 94%  Weight:      Height:        Examination:  GENERAL: No apparent distress.  Nontoxic. NECK: Supple.   RESP:  No IWOB.  Fair aeration bilaterally. CVS:  RRR. Moderate holosystolic murmur ABD/GI/GU: BS+. Abd soft, NTND.  MSK/EXT:  Moves extremities. No apparent deformity. No edema.  SKIN: no apparent skin lesion or wound NEURO: Awake but not quite alert.  Alert and oriented PSYCH: Calm. Normal affect.   Procedures:   None  Microbiology summarized: 5/30-COVID-19 and influenza PCR nonreactive. 5/30-MRSA PCR screen negative. 5/30-blood culture Staphylococcus caprae  Assessment and Plan: * Septic shock due to Staphylococcus bacteremia Suspect odontogenic source given poor dentition.  Blood culture with Staphylococcus Caprae.  MRSA PCR screen negative.  Sepsis physiology resolved.  No significant finding on TTE.  Repeat blood cultures NGTD.  MRI lumbar spine with DDD but no suspicion for discitis osteomyelitis. -Infectious disease following. -Vancomycin 5/30>> IV Ancef 6/2>>> -CTX and Zithromax 5/30-5/31. -ID recommends TEE.  Cardiology consulted. Plan for procedure tomorrow -Dental f/u outpt  AKI (acute kidney injury) ([place]) Recent Labs  Resolved  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 Looks compensated.  Hx TIPS. Mild LFT elevation with mild hyperbilirubinemia likely from sepsis.  -Continue rifaximin -Continue lactulose but increase to 40 tid from 30 given lack of BM last 24 -Discontinue tramadol and oxycodone -Monitor LFT and ammonia intermittently - resume home lasix  Fall at home, initial encounter Reportedly slipped and fell at home.  No notable injury.  CT head and cervical spine and MRI lumbar spine without acute finding. -Fall precaution -PT/OT advising snf, family declines, hh pt/ot/rn orders placed today  Hypotension Resolved.   -Continue low-dose midodrine  Chronic diastolic CHF (congestive heart failure) ([place]) TTE in 2020 with LVEF of 55 to 65%.  Seems to be on p.o. Lasix 20 mg daily at home.  Appears euvolemic on exam.  TTE without significant finding.  Controlled IDDM-2 with hyperglycemia A1c 6.1%  about 1 month ago.  Uses Lantus 16 units daily and NovoLog 10 units 3 times daily Recent Labs  Lab 05/18/22 1145 05/18/22 1621 05/19/22 0725 05/19/22 1046 05/19/22 1138  GLUCAP 262* 251* 167* 144* 146*  -Continue SSI-moderate -Continue  Semglee 20 units at night -Continue NovoLog 5 units 3 times daily with meals -Further adjustment as appropriate.  Hyponatremia, hypokalemia and hypomagnesemia Monitor and replenish as appropriate  Acute hepatic encephalopathy (HCC) Resolved  Hypomagnesemia Mg 1.6.  IV magnesium sulfate 2 g x 1  Back pain Lumbar MRI with DDD but no concern for discitis or osteomyelitis.  No fracture. -OOB, PT/OT and pain control.  Lactic acidosis Resolved.  Obesity (BMI 30-39.9) Body mass index is 30.57 kg/m.  Physical deconditioning PT/OT  Hypothyroidism Continue home Synthroid.  Right shoulder pain Chronic.  Unchanged per patient.  Seems to have improved. -Tylenol as needed -Discontinue tramadol and oxycodone.  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  Leukopenia resolved.  Thrombocytopenia improving.  H&H stable. -Continue monitoring  Portal hypertension ([place]) S/p tips. Doesn't appear volume overloaded    DVT prophylaxis:  SCDs Start: 05/14/22 1608  Code Status: Full code Family Communication: daughter updated telephonically 6/5 Level of care: Med-Surg Status is: Inpatient Remains inpatient appropriate because: Staphylococcus bacteremia and further evaluation to rule out endocarditis   Final disposition: home w/ home health Consultants:  Pulmonology admitted patient Infectious disease Cardiology  Sch Meds:  Scheduled Meds:  Chlorhexidine Gluconate Cloth  6 each Topical Q0600   insulin aspart  0-15 Units Subcutaneous TID WC   insulin aspart  0-5 Units Subcutaneous QHS   insulin aspart  5 Units Subcutaneous TID WC   insulin glargine-yfgn  20 Units Subcutaneous QHS   lactulose  30 g Oral TID   levothyroxine  100 mcg Oral Q0600   lidocaine  1 patch Transdermal Q24H   midodrine  5 mg Oral TID WC   pantoprazole  40 mg Oral Daily   rifaximin  550 mg Oral BID   Continuous Infusions:  sodium chloride Stopped (05/18/22  2229)    ceFAZolin (ANCEF) IV Stopped (05/20/22 0533)   magnesium sulfate bolus IVPB     PRN Meds:.sodium chloride, acetaminophen, ketotifen, polyvinyl alcohol  Antimicrobials: Anti-infectives (From admission, onward)    Start     Dose/Rate Route Frequency Ordered Stop   05/17/22 2200  ceFAZolin (ANCEF) IVPB 2g/100 mL premix        2 g 200 mL/hr over 30 Minutes Intravenous Every 8 hours 05/17/22 1424     05/15/22 1400  rifaximin (XIFAXAN) tablet 550 mg        550 mg Oral 2 times daily 05/15/22 1229     05/15/22 1000  vancomycin (VANCOREADY) IVPB 2000 mg/400 mL  Status:  Discontinued        2,000 mg 200 mL/hr over 120 Minutes Intravenous Every 24 hours 05/15/22 0306 05/17/22 1424   05/14/22 2000  cefTRIAXone (ROCEPHIN) 2 g in sodium chloride 0.9 % 100 mL IVPB  Status:  Discontinued        2 g 200 mL/hr over 30 Minutes Intravenous Every 24 hours 05/14/22 1617 05/15/22 0257   05/14/22 1800  azithromycin (ZITHROMAX) 500 mg in sodium chloride 0.9 % 250 mL IVPB  Status:  Discontinued        500 mg 250 mL/hr over 60 Minutes Intravenous Every 24 hours 05/14/22 1617 05/15/22 1512   05/14/22 1330  vancomycin (VANCOREADY) IVPB 1250 mg/250  mL        1,250 mg 166.7 mL/hr over 90 Minutes Intravenous  Once 05/14/22 1220 05/14/22 1435   05/14/22 1215  ceFEPIme (MAXIPIME) 2 g in sodium chloride 0.9 % 100 mL IVPB        2 g 200 mL/hr over 30 Minutes Intravenous  Once 05/14/22 1202 05/14/22 1300   05/14/22 1215  vancomycin (VANCOCIN) IVPB 1000 mg/200 mL premix        1,000 mg 200 mL/hr over 60 Minutes Intravenous  Once 05/14/22 1202 05/14/22 1331        I have personally reviewed the following labs and images: CBC: Recent Labs  Lab 05/14/22 1104 05/15/22 0323 05/16/22 0441 05/17/22 0550 05/19/22 0348  WBC 4.2 3.8* 4.8 4.7 4.4  NEUTROABS 3.5  --   --   --  2.8  HGB 11.8* 10.1* 9.8* 10.7* 11.8*  HCT 34.1* 29.0* 28.6* 30.1* 33.2*  MCV 94.7 92.7 92.9 91.8 93.5  PLT 35* 26* 26* 40* 76*    BMP &GFR Recent Labs  Lab 05/14/22 1609 05/15/22 0323 05/16/22 0441 05/17/22 0550 05/18/22 0544 05/20/22 0732  NA  --  136 136 135 134* 134*  K  --  4.0 3.7 3.9 3.4* 3.9  CL  --  108 107 104 102 103  CO2  --  23 24 27 26 23   GLUCOSE  --  193* 167* 155* 145* 158*  BUN  --  17 15 11 10 8   CREATININE  --  1.26* 0.96 0.80 0.74 0.76  CALCIUM  --  8.0* 8.2* 8.1* 8.3* 8.6*  MG 1.7 1.5* 1.7 1.6* 1.6* 1.5*  PHOS 2.8 3.3  --  2.6 3.1 3.2   Estimated Creatinine Clearance: 100.4 mL/min (by C-G formula based on SCr of 0.76 mg/dL). Liver & Pancreas: Recent Labs  Lab 05/16/22 0441 05/17/22 0550 05/18/22 0544 05/19/22 0348 05/20/22 0732  AST 80* 78* 87* 123* 80*  ALT 47* 51* 62* 83* 53*  ALKPHOS 63 72 84 106 99  BILITOT 1.8* 2.2* 2.0* 1.9* 1.7*  PROT 5.5* 6.0* 6.0* 6.4* 6.1*  ALBUMIN 2.6* 2.8* 2.7* 2.8* 2.7*   No results for input(s): LIPASE, AMYLASE in the last 168 hours. Recent Labs  Lab 05/14/22 1104 05/17/22 0550 05/18/22 0544 05/19/22 0348 05/20/22 0732  AMMONIA 72* 53* 71* 26 42*   Diabetic: No results for input(s): HGBA1C in the last 72 hours. Recent Labs  Lab 05/19/22 1138 05/19/22 1622 05/19/22 2053 05/20/22 0824 05/20/22 1122  GLUCAP 146* 236* 135* 170* 142*   Cardiac Enzymes: No results for input(s): CKTOTAL, CKMB, CKMBINDEX, TROPONINI in the last 168 hours. No results for input(s): PROBNP in the last 8760 hours. Coagulation Profile: Recent Labs  Lab 05/17/22 0550 05/18/22 0544  INR 1.3* 1.3*   Thyroid Function Tests: Recent Labs    05/20/22 0732  TSH 2.477   Lipid Profile: No results for input(s): CHOL, HDL, LDLCALC, TRIG, CHOLHDL, LDLDIRECT in the last 72 hours. Anemia Panel: No results for input(s): VITAMINB12, FOLATE, FERRITIN, TIBC, IRON, RETICCTPCT in the last 72 hours. Urine analysis:    Component Value Date/Time   COLORURINE YELLOW (A) 05/14/2022 1104   APPEARANCEUR CLEAR (A) 05/14/2022 1104   APPEARANCEUR [place] 05/01/2014 1653    LABSPEC 1.021 05/14/2022 1104   LABSPEC 1.015 05/01/2014 1653   PHURINE 7.0 05/14/2022 1104   GLUCOSEU NEGATIVE 05/14/2022 1104   GLUCOSEU Negative 05/01/2014 1653   HGBUR NEGATIVE 05/14/2022 [place] 05/14/2022 1104   BILIRUBINUR  Negative 05/01/2014 [place] 05/14/2022 [place] 05/14/2022 1104   NITRITE NEGATIVE 05/14/2022 [place] 05/14/2022 1104   LEUKOCYTESUR Negative 05/01/2014 1653   Sepsis Labs: Invalid input(s): PROCALCITONIN, [place]  Microbiology: Recent Results (from the past 240 hour(s))  Blood Culture (routine x 2)     Status: Abnormal   Collection Time: 05/14/22 11:06 AM   Specimen: BLOOD  Result Value Ref Range Status   Specimen Description   Final    BLOOD BLOOD RIGHT FOREARM Performed at [facility], [address], [place], [place] [address]    Special Requests   Final    BOTTLES DRAWN AEROBIC AND ANAEROBIC Blood Culture adequate volume Performed at [facility], [address] [place], [place] [address]    Culture  Setup Time   Final    Organism ID to follow GRAM POSITIVE COCCI IN BOTH AEROBIC AND ANAEROBIC BOTTLES CRITICAL RESULT CALLED TO, READ BACK BY AND VERIFIED WITH: NATHAN BELUTH @ 5003 ON 05/15/2022.Marland KitchenMarland KitchenTKR    Culture STAPHYLOCOCCUS CAPRAE (A)  Final   Report Status 05/17/2022 FINAL  Final   Organism ID, Bacteria STAPHYLOCOCCUS CAPRAE  Final      Susceptibility   Staphylococcus caprae - MIC*    CIPROFLOXACIN 1 SENSITIVE Sensitive     ERYTHROMYCIN <=0.25 SENSITIVE Sensitive     GENTAMICIN <=0.5 SENSITIVE Sensitive     OXACILLIN <=0.25 SENSITIVE Sensitive     TETRACYCLINE <=1 SENSITIVE Sensitive     VANCOMYCIN <=0.5 SENSITIVE Sensitive     TRIMETH/SULFA <=10 SENSITIVE Sensitive     CLINDAMYCIN <=0.25 SENSITIVE Sensitive     RIFAMPIN >=32 RESISTANT Resistant     Inducible Clindamycin NEGATIVE Sensitive     * STAPHYLOCOCCUS CAPRAE  Blood  Culture ID Panel (Reflexed)     Status: Abnormal   Collection Time: 05/14/22 11:06 AM  Result Value Ref Range Status   Enterococcus faecalis NOT DETECTED NOT DETECTED Final   Enterococcus Faecium NOT DETECTED NOT DETECTED Final   Listeria monocytogenes NOT DETECTED NOT DETECTED Final   Staphylococcus species DETECTED (A) NOT DETECTED Final    Comment: NATHAN BELUTH @ 7048 ON 05/15/2022.Marland KitchenMarland KitchenTKR   Staphylococcus aureus (BCID) NOT DETECTED NOT DETECTED Final   Staphylococcus epidermidis NOT DETECTED NOT DETECTED Final   Staphylococcus lugdunensis NOT DETECTED NOT DETECTED Final   Streptococcus species NOT DETECTED NOT DETECTED Final   Streptococcus agalactiae NOT DETECTED NOT DETECTED Final   Streptococcus pneumoniae NOT DETECTED NOT DETECTED Final   Streptococcus pyogenes NOT DETECTED NOT DETECTED Final   A.calcoaceticus-baumannii NOT DETECTED NOT DETECTED Final   Bacteroides fragilis NOT DETECTED NOT DETECTED Final   Enterobacterales NOT DETECTED NOT DETECTED Final   Enterobacter cloacae complex NOT DETECTED NOT DETECTED Final   Escherichia coli NOT DETECTED NOT DETECTED Final   Klebsiella aerogenes NOT DETECTED NOT DETECTED Final   Klebsiella oxytoca NOT DETECTED NOT DETECTED Final   Klebsiella pneumoniae NOT DETECTED NOT DETECTED Final   Proteus species NOT DETECTED NOT DETECTED Final   Salmonella species NOT DETECTED NOT DETECTED Final   Serratia marcescens NOT DETECTED NOT DETECTED Final   Haemophilus influenzae NOT DETECTED NOT DETECTED Final   Neisseria meningitidis NOT DETECTED NOT DETECTED Final   Pseudomonas aeruginosa NOT DETECTED NOT DETECTED Final   Stenotrophomonas maltophilia NOT DETECTED NOT DETECTED Final   Candida albicans NOT DETECTED NOT DETECTED Final   Candida auris NOT DETECTED NOT DETECTED Final   Candida glabrata NOT DETECTED NOT DETECTED Final  Candida krusei NOT DETECTED NOT DETECTED Final   Candida parapsilosis NOT DETECTED NOT DETECTED Final   Candida  tropicalis NOT DETECTED NOT DETECTED Final   Cryptococcus neoformans/gattii NOT DETECTED NOT DETECTED Final    Comment: Performed at [facility], [address] [place], [place] [address]  Blood Culture (routine x 2)     Status: Abnormal   Collection Time: 05/14/22 11:22 AM   Specimen: BLOOD  Result Value Ref Range Status   Specimen Description   Final    BLOOD RIGHT ARM Performed at [facility], [address] [place], [place] [address]    Special Requests   Final    BOTTLES DRAWN AEROBIC AND ANAEROBIC Blood Culture results may not be optimal due to an excessive volume of blood received in culture bottles Performed at [facility], [address] [place], [place] [address]    Culture  Setup Time   Final    GRAM POSITIVE COCCI IN BOTH AEROBIC AND ANAEROBIC BOTTLES CRITICAL VALUE NOTED.  VALUE IS CONSISTENT WITH PREVIOUSLY REPORTED AND CALLED VALUE.    Culture (A)  Final    STAPHYLOCOCCUS CAPRAE SUSCEPTIBILITIES PERFORMED ON PREVIOUS CULTURE WITHIN THE LAST 5 DAYS. Performed at [facility], [place] [address], [place], [place] [address]    Report Status 05/17/2022 FINAL  Final  Resp Panel by RT-PCR (Flu A&B, Covid) Anterior Nasal Swab     Status: None   Collection Time: 05/14/22 12:04 PM   Specimen: Anterior Nasal Swab  Result Value Ref Range Status   SARS Coronavirus 2 by RT PCR NEGATIVE NEGATIVE Final    Comment: (NOTE) SARS-CoV-2 target nucleic acids are NOT DETECTED.  The SARS-CoV-2 RNA is generally detectable in upper respiratory specimens during the acute phase of infection. The lowest concentration of SARS-CoV-2 viral copies this assay can detect is 138 copies/mL. A negative result does not preclude SARS-Cov-2 infection and should not be used as the sole basis for treatment or other patient management decisions. A negative result may occur with  improper specimen collection/handling, submission of specimen other than nasopharyngeal  swab, presence of viral mutation(s) within the areas targeted by this assay, and inadequate number of viral copies(<138 copies/mL). A negative result must be combined with clinical observations, patient history, and epidemiological information. The expected result is Negative.  Fact Sheet for Patients:  EntrepreneurPulse.com.au  Fact Sheet for Healthcare Providers:  IncredibleEmployment.be  This test is no t yet approved or cleared by the [place] FDA and  has been authorized for detection and/or diagnosis of SARS-CoV-2 by FDA under an Emergency Use Authorization (EUA). This EUA will remain  in effect (meaning this test can be used) for the duration of the COVID-19 declaration under Section 564(b)(1) of the Act, 21 U.S.C.section 360bbb-3(b)(1), unless the authorization is terminated  or revoked sooner.       Influenza A by PCR NEGATIVE NEGATIVE Final   Influenza B by PCR NEGATIVE NEGATIVE Final    Comment: (NOTE) The Xpert Xpress SARS-CoV-2/FLU/RSV plus assay is intended as an aid in the diagnosis of influenza from Nasopharyngeal swab specimens and should not be used as a sole basis for treatment. Nasal washings and aspirates are unacceptable for Xpert Xpress SARS-CoV-2/FLU/RSV testing.  Fact Sheet for Patients: EntrepreneurPulse.com.au  Fact Sheet for Healthcare Providers: IncredibleEmployment.be  This test is not yet approved or cleared by the [place] FDA and has been authorized for detection and/or diagnosis of SARS-CoV-2 by FDA under an Emergency Use Authorization (EUA).  This EUA will remain in effect (meaning this test can be used) for the duration of the COVID-19 declaration under Section 564(b)(1) of the Act, 21 U.S.C. section 360bbb-3(b)(1), unless the authorization is terminated or revoked.  Performed at [facility], [address] [place], [place] [address]   MRSA  Next Gen by PCR, Nasal     Status: None   Collection Time: 05/14/22  9:29 PM   Specimen: Nasal Mucosa; Nasal Swab  Result Value Ref Range Status   MRSA by PCR Next Gen NOT DETECTED NOT DETECTED Final    Comment: (NOTE) The GeneXpert MRSA Assay (FDA approved for NASAL specimens only), is one component of a comprehensive MRSA colonization surveillance program. It is not intended to diagnose MRSA infection nor to guide or monitor treatment for MRSA infections. Test performance is not FDA approved in patients less than 44 years old. Performed at [facility], [address] [place], [place] [address]   Culture, blood (Routine X 2) w Reflex to ID Panel     Status: None (Preliminary result)   Collection Time: 05/16/22  3:30 PM   Specimen: BLOOD  Result Value Ref Range Status   Specimen Description BLOOD LAC  Final   Special Requests BOTTLES DRAWN AEROBIC AND ANAEROBIC BCAV  Final   Culture   Final    NO GROWTH 4 DAYS Performed at [facility], [address] [place], [place] [address]    Report Status PENDING  Incomplete  Culture, blood (Routine X 2) w Reflex to ID Panel     Status: None (Preliminary result)   Collection Time: 05/16/22  3:35 PM   Specimen: BLOOD  Result Value Ref Range Status   Specimen Description BLOOD RAC  Final   Special Requests BOTTLES DRAWN AEROBIC AND ANAEROBIC BCAV  Final   Culture   Final    NO GROWTH 4 DAYS Performed at [facility], [address], [place], [place] [address]    Report Status PENDING  Incomplete    Radiology Studies: No results found.   Laurey Arrow, MD [facility]  If 7PM-7AM, please contact night-coverage [email or web address] 05/20/2022, 1:03 PM

## 2022-05-20 NOTE — Progress Notes (Signed)
Date of Admission:  05/14/2022      ID: MARINUS EICHER is a 76 y.o. 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   Portal hypertension (HCC)   Controlled IDDM-2 with hyperglycemia   Chronic diastolic CHF (congestive heart failure) (HCC)   Pancytopenia (HCC)   AKI (acute kidney injury) (HCC)   Elevated liver enzymes   Hypotension   Right shoulder pain   Hypothyroidism   Physical deconditioning   Obesity (BMI 30-39.9)   Lactic acidosis   Fall at home, initial encounter   Back pain   Hypomagnesemia   Acute hepatic encephalopathy (HCC)   Hyponatremia, hypokalemia and hypomagnesemia   Hyponatremia   Hypokalemia    Subjective: No specific complaints Does not have any pain   Medications:   Chlorhexidine Gluconate Cloth  6 each Topical Q0600   insulin aspart  0-15 Units Subcutaneous TID WC   insulin aspart  0-5 Units Subcutaneous QHS   insulin aspart  5 Units Subcutaneous TID WC   insulin glargine-yfgn  20 Units Subcutaneous QHS   lactulose  30 g Oral TID   levothyroxine  100 mcg Oral Q0600   lidocaine  1 patch Transdermal Q24H   midodrine  5 mg Oral TID WC   pantoprazole  40 mg Oral Daily   rifaximin  550 mg Oral BID    Objective: Vital signs in last 24 hours: Temp:  [98.5 F (36.9 C)-98.6 F (37 C)] 98.6 F (37 C) (06/05 0826) Pulse Rate:  [56-67] 67 (06/05 0826) Resp:  [16-20] 18 (06/05 0826) BP: (100-129)/(52-72) 129/72 (06/05 0826) SpO2:  [94 %-97 %] 94 % (06/05 0826)    PHYSICAL EXAM:  General: Awake, alert, responds to commands but takes his time to respond Lungs: Clear to auscultation bilaterally. No Wheezing or Rhonchi. No rales. Heart: Regular rate and rhythm, no murmur, rub or gallop. Abdomen: Soft, non-tender,not distended. Bowel sounds normal. No masses Extremities: atraumatic, no cyanosis. No edema. No clubbing Skin: No  rashes or lesions. Or bruising Lymph: Cervical, supraclavicular normal. Neurologic: Grossly non-focal  Lab Results    Latest Ref Rng & Units 05/19/2022    3:48 AM 05/17/2022    5:50 AM 05/16/2022    4:41 AM  CBC  WBC 4.0 - 10.5 K/uL 4.4   4.7   4.8    Hemoglobin 13.0 - 17.0 g/dL 11.8   10.7   9.8    Hematocrit 39.0 - 52.0 % 33.2   30.1   28.6    Platelets 150 - 400 K/uL 76   40   26         Latest Ref Rng & Units 05/20/2022    7:32 AM 05/18/2022    5:44 AM 05/17/2022    5:50 AM  BMP  Glucose 70 - 99 mg/dL 158   145   155    BUN 8 - 23 mg/dL 8   10   11     Creatinine 0.61 - 1.24 mg/dL 0.76   0.74   0.80    Sodium 135 - 145 mmol/L 134   134   135    Potassium 3.5 - 5.1 mmol/L 3.9   3.4   3.9    Chloride 98 - 111 mmol/L 103   102   104    CO2 22 - 32 mmol/L 23   26   27     Calcium 8.9 - 10.3  mg/dL 8.6   8.3   8.1       Liver Panel Recent Labs    05/19/22 0348 05/20/22 0732  PROT 6.4* 6.1*  ALBUMIN 2.8* 2.7*  AST 123* 80*  ALT 83* 53*  ALKPHOS 106 99  BILITOT 1.9* 1.7*  BILIDIR 0.5*  --   IBILI 1.4*  --     Microbiology: Micro 05/14/22 Gram positive cocci- staphylococcus capri 05/16/2022 blood culture no growth  Assessment/Plan: 76 year old male with history of TAVR, cirrhosis, status post TIPS, diabetes mellitus, severe thrombocytopenia presenting with fall confusion and fever Sepsis Coag negative staph bacteremia secondary to [place]  patient has TAVR and hence concern for endocarditis and is getting TEE tomorrow Currently on cefazolin Will need minimum of 4 weeks of antibiotics MRI of the lumbar spine no evidence of discitis or osteomyelitis Repeat blood culture has been negative  Poor dentition.  Waiting extraction of all teeth as outpatient  Confusion from hepatic encephalopathy  AKI resolved  Diabetes mellitus on insulin  Cirrhosis of liver with decompensation.  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

## 2022-05-20 NOTE — TOC Progression Note (Signed)
Transition of Care [facility]) - Progression Note    Patient Details  Name: NICOLA QUESNELL MRN: [id number] Date of Birth: 26-Aug-1946  Transition of Care [facility]) CM/SW [place], LCSW Phone Number: 05/20/2022, 4:01 PM  Clinical Narrative:    CSW notified pt that we could look in the [place]/ area for a SNF. Pt stated that he wants a [place] option. CSW informed him that [place], [place] areas do not have options that are accepting [facility] admissions at this time. Pt declined SNF. Pt wants to go [facility] with [facility].    Expected Discharge Plan: [place] Barriers to Discharge: Continued Medical Work up  Expected Discharge Plan and Services Expected Discharge Plan: [place] arrangements for the past 2 months: [place] Agency: [place] Date [place]: 05/17/22   Representative spoke with at [place]: [place] (formerly [facility])   Social Determinants of Health ([place]) Interventions    Readmission Risk Interventions    05/17/2022    1:45 PM  Readmission Risk Prevention Plan  Transportation Screening Complete  PCP or Specialist Appt within 5-7 Days Complete  Home Care Screening Complete  Medication Review (RN CM) Complete

## 2022-05-20 NOTE — Progress Notes (Signed)
OT Cancellation Note  Patient Details Name: Joshua Berry MRN: [id number] DOB: Mar 11, 1946   Cancelled Treatment:    Reason Eval/Treat Not Completed: Fatigue/lethargy limiting ability to participate. Chart reviewed. Upon arrival pt in bed, reports he has already been up today and too fatigued to attempt again. Pt educated on importance of OOB for strengthening as family has refused STR and pt planning d/c home when stable. Pt states he does not want to rule out STR, CSW notified. Pt eyes closing at end of conversation, will re-attempt at later date/time as pt willing.   Dessie Coma, M.S. OTR/L  05/20/22, 2:25 PM  ascom [phone]

## 2022-05-20 NOTE — Progress Notes (Addendum)
Mobility Specialist - Progress Note    05/20/22 1200  Mobility  Activity Ambulated with assistance in room;Stood at bedside;Dangled on edge of bed;Transferred from bed to chair  Level of Assistance Minimal assist, patient does 75% or more  Assistive Device Front wheel walker  Distance Ambulated (ft) 5 ft  Activity Response Tolerated well  $Mobility charge 1 Mobility    Pt supine upon arrival using RA. Completes bed mobility Supervision/MinA -- Log rolled and used hand rail for UE support to sit, MinA for LE to scoot towards EOB. STS MinA from elevated bed level. Ambulates 5 steps forward + 5 steps back MinA--- vc for step through pattern and to descend to chair w/ proper hand placements. Less back pain voiced this date and mobility significantly improved. Pt is left in recliner with alarm set, RN notified.   Merrily Brittle Mobility Specialist 05/20/22, 12:43 PM

## 2022-05-21 ENCOUNTER — Inpatient Hospital Stay (HOSPITAL_COMMUNITY)
Admit: 2022-05-21 | Discharge: 2022-05-21 | Disposition: A | Discharge status: Home / Self Care | Payer: No Typology Code available for payment source | Attending: Nurse Practitioner | Admitting: Nurse Practitioner

## 2022-05-21 ENCOUNTER — Encounter
Admission: EM | Disposition: A | Discharge status: Skilled Nursing Facility | Payer: Self-pay | Source: Home / Self Care | Attending: Hospitalist

## 2022-05-21 DIAGNOSIS — B957 Other staphylococcus as the cause of diseases classified elsewhere: Secondary | ICD-10-CM | POA: Diagnosis not present

## 2022-05-21 DIAGNOSIS — R4182 Altered mental status, unspecified: Secondary | ICD-10-CM | POA: Diagnosis not present

## 2022-05-21 DIAGNOSIS — R7881 Bacteremia: Secondary | ICD-10-CM | POA: Diagnosis not present

## 2022-05-21 DIAGNOSIS — A412 Sepsis due to unspecified staphylococcus: Secondary | ICD-10-CM | POA: Diagnosis not present

## 2022-05-21 DIAGNOSIS — I361 Nonrheumatic tricuspid (valve) insufficiency: Secondary | ICD-10-CM

## 2022-05-21 DIAGNOSIS — R6521 Severe sepsis with septic shock: Secondary | ICD-10-CM | POA: Diagnosis not present

## 2022-05-21 HISTORY — PX: TEE WITHOUT CARDIOVERSION: SHX5443

## 2022-05-21 LAB — CBC
HCT: 31.2 % — ABNORMAL LOW (ref 39.0–52.0)
Hemoglobin: 10.8 g/dL — ABNORMAL LOW (ref 13.0–17.0)
MCH: 32.1 pg (ref 26.0–34.0)
MCHC: 34.6 g/dL (ref 30.0–36.0)
MCV: 92.9 fL (ref 80.0–100.0)
Platelets: 62 10*3/uL — ABNORMAL LOW (ref 150–400)
RBC: 3.36 MIL/uL — ABNORMAL LOW (ref 4.22–5.81)
RDW: 14.8 % (ref 11.5–15.5)
WBC: 5.1 10*3/uL (ref 4.0–10.5)
nRBC: 0 % (ref 0.0–0.2)

## 2022-05-21 LAB — MAGNESIUM: Magnesium: 2 mg/dL (ref 1.7–2.4)

## 2022-05-21 LAB — BASIC METABOLIC PANEL
Anion gap: 1 — ABNORMAL LOW (ref 5–15)
BUN: 10 mg/dL (ref 8–23)
CO2: 29 mmol/L (ref 22–32)
Calcium: 8.6 mg/dL — ABNORMAL LOW (ref 8.9–10.3)
Chloride: 105 mmol/L (ref 98–111)
Creatinine, Ser: 1.31 mg/dL — ABNORMAL HIGH (ref 0.61–1.24)
GFR, Estimated: 56 mL/min — ABNORMAL LOW (ref >60)
Glucose, Bld: 147 mg/dL — ABNORMAL HIGH (ref 70–99)
Potassium: 3.6 mmol/L (ref 3.5–5.1)
Sodium: 135 mmol/L (ref 135–145)

## 2022-05-21 LAB — GLUCOSE, CAPILLARY
Glucose-Capillary: 164 mg/dL — ABNORMAL HIGH (ref 70–99)
Glucose-Capillary: 192 mg/dL — ABNORMAL HIGH (ref 70–99)
Glucose-Capillary: 164 mg/dL — ABNORMAL HIGH (ref 70–99)
Glucose-Capillary: 180 mg/dL — ABNORMAL HIGH (ref 70–99)

## 2022-05-21 LAB — CULTURE, BLOOD (ROUTINE X 2)
Culture: NO GROWTH
Culture: NO GROWTH

## 2022-05-21 SURGERY — ECHOCARDIOGRAM, TRANSESOPHAGEAL
Anesthesia: Moderate Sedation

## 2022-05-21 MED ORDER — SODIUM CHLORIDE 0.9 % IV BOLUS
500.0000 mL | Freq: Once | INTRAVENOUS | Status: AC
  Administered 2022-05-21: 500 mL via INTRAVENOUS

## 2022-05-21 MED ORDER — FENTANYL CITRATE (PF) 100 MCG/2ML IJ SOLN
INTRAMUSCULAR | Status: AC | PRN
  Administered 2022-05-21: 50 ug via INTRAVENOUS

## 2022-05-21 MED ORDER — FENTANYL CITRATE (PF) 100 MCG/2ML IJ SOLN
INTRAMUSCULAR | Status: AC
  Filled 2022-05-21: qty 2

## 2022-05-21 MED ORDER — BUTAMBEN-TETRACAINE-BENZOCAINE 2-2-14 % EX AERO
INHALATION_SPRAY | CUTANEOUS | Status: AC | PRN
  Administered 2022-05-21: 1 via TOPICAL

## 2022-05-21 MED ORDER — BUTAMBEN-TETRACAINE-BENZOCAINE 2-2-14 % EX AERO
INHALATION_SPRAY | CUTANEOUS | Status: AC
  Filled 2022-05-21: qty 5

## 2022-05-21 MED ORDER — MIDAZOLAM HCL 2 MG/2ML IJ SOLN
INTRAMUSCULAR | Status: AC | PRN
  Administered 2022-05-21: 2 mg via INTRAVENOUS

## 2022-05-21 MED ORDER — MIDAZOLAM HCL 2 MG/2ML IJ SOLN
INTRAMUSCULAR | Status: AC
  Filled 2022-05-21: qty 4

## 2022-05-21 MED ORDER — LIDOCAINE VISCOUS HCL 2 % MT SOLN
OROMUCOSAL | Status: AC
  Filled 2022-05-21: qty 15

## 2022-05-21 MED ORDER — SODIUM CHLORIDE FLUSH 0.9 % IV SOLN
INTRAVENOUS | Status: AC
  Filled 2022-05-21: qty 10

## 2022-05-21 MED ORDER — LIDOCAINE VISCOUS HCL 2 % MT SOLN
OROMUCOSAL | Status: AC | PRN
  Administered 2022-05-21: 15 mL via OROMUCOSAL

## 2022-05-21 NOTE — Progress Notes (Signed)
Transesophageal Echocardiogram :  Indication: Rule out endocarditis, bacteremia Requesting/ordering  physician: Dr. Si Raider  Procedure: Benzocaine spray x2 and 2 mls x 2 of viscous lidocaine were given orally to provide local anesthesia to the oropharynx. The patient was positioned supine on the left side, bite block provided. The patient was moderately sedated with the doses of versed and fentanyl as detailed below.  Using digital technique an omniplane probe was advanced into the distal esophagus without incident.   Moderate sedation: 1. Sedation used:  Versed: 2 mg IV push, Fentanyl: 50 mcg IV push 2. Time administered: 12:50 PM   time when patient started recovery: 1:20 PM Total sedation time 30 minutes 3. I was face to face during this time  See report in EPIC  for complete details: In brief,  No valve vegetation noted TAVR well-seated with no significant gradient, opens well, no leaflet calcification or evidence of stenosis, no significant regurgitation  transgastric imaging revealed normal LV function with no RWMAs and no mural apical thrombus.  .  Estimated ejection fraction was 55% .  Right sided cardiac chambers were normal with no evidence of pulmonary hypertension.  Imaging of the septum showed no ASD or VSD Bubble study was negative for shunt 2D and color flow confirmed no PFO  The LA was well visualized in orthogonal views.  There was no spontaneous contrast and no thrombus in the LA and LA appendage   The descending thoracic aorta had no  mural aortic debris with no evidence of aneurysmal dilation or dissection.  Mild aortic atherosclerosis   Ida Rogue 05/21/2022 1:42 PM

## 2022-05-21 NOTE — Progress Notes (Signed)
*  PRELIMINARY RESULTS* Echocardiogram Echocardiogram Transesophageal has been performed.  Sherrie Sport 05/21/2022, 2:15 PM

## 2022-05-21 NOTE — Progress Notes (Signed)
Date of Admission:  05/14/2022      ID: Joshua Berry is a 76 y.o. 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   Portal hypertension (HCC)   Controlled IDDM-2 with hyperglycemia   Chronic diastolic CHF (congestive heart failure) (HCC)   Pancytopenia (HCC)   AKI (acute kidney injury) (HCC)   Elevated liver enzymes   Hypotension   Right shoulder pain   Hypothyroidism   Physical deconditioning   Obesity (BMI 30-39.9)   Lactic acidosis   Fall at home, initial encounter   Back pain   Hypomagnesemia   Acute hepatic encephalopathy (HCC)   Hyponatremia, hypokalemia and hypomagnesemia   Hyponatremia   Hypokalemia    Subjective: TEE done today Patient has minimal upper abdominal pain Fine tremors in his hands and patient states he gets it when he has anesthesia  Medications:   aspirin EC  81 mg Oral Daily   atorvastatin  20 mg Oral QHS   butamben-tetracaine-benzocaine       Chlorhexidine Gluconate Cloth  6 each Topical Q0600   fentaNYL       insulin aspart  0-15 Units Subcutaneous TID WC   insulin aspart  0-5 Units Subcutaneous QHS   insulin aspart  5 Units Subcutaneous TID WC   insulin glargine-yfgn  20 Units Subcutaneous QHS   lactulose  40 g Oral TID   levothyroxine  100 mcg Oral Q0600   lidocaine  1 patch Transdermal Q24H   lidocaine       midazolam       midodrine  5 mg Oral TID WC   pantoprazole  40 mg Oral Daily   rifaximin  550 mg Oral BID   sodium chloride flush       terazosin  5 mg Oral QHS    Objective: Vital signs in last 24 hours: Temp:  [98 F (36.7 C)-98.8 F (37.1 C)] 98.3 F (36.8 C) (06/06 1917) Pulse Rate:  [57-70] 69 (06/06 1917) Resp:  [14-18] 18 (06/06 1917) BP: (82-136)/(47-69) 130/59 (06/06 1917) SpO2:  [92 %-100 %] 96 % (06/06 1917) Weight:  [102.6 kg] 102.6 kg (06/06 0435)    PHYSICAL EXAM:  General: Awake,  alert, responds to commands but very slow speech lungs: Clear to auscultation bilaterally. No Wheezing or Rhonchi. No rales. Heart: S1-S2 point abdomen: Soft, non-tender,not distended. Bowel sounds normal. No masses Extremities: atraumatic, no cyanosis. No edema. No clubbing Skin: No rashes or lesions. Or bruising Lymph: Cervical, supraclavicular normal. Neurologic: Tremors in his hands Lab Results    Latest Ref Rng & Units 05/21/2022    1:56 AM 05/19/2022    3:48 AM 05/17/2022    5:50 AM  CBC  WBC 4.0 - 10.5 K/uL 5.1   4.4   4.7    Hemoglobin 13.0 - 17.0 g/dL 10.8   11.8   10.7    Hematocrit 39.0 - 52.0 % 31.2   33.2   30.1    Platelets 150 - 400 K/uL 62   76   40         Latest Ref Rng & Units 05/21/2022    1:56 AM 05/20/2022    7:32 AM 05/18/2022    5:44 AM  BMP  Glucose 70 - 99 mg/dL 147   158   145    BUN 8 - 23 mg/dL 10   8   10     Creatinine 0.61 -  1.24 mg/dL 1.31   0.76   0.74    Sodium 135 - 145 mmol/L 135   134   134    Potassium 3.5 - 5.1 mmol/L 3.6   3.9   3.4    Chloride 98 - 111 mmol/L 105   103   102    CO2 22 - 32 mmol/L 29   23   26     Calcium 8.9 - 10.3 mg/dL 8.6   8.6   8.3       Liver Panel Recent Labs    05/19/22 0348 05/20/22 0732  PROT 6.4* 6.1*  ALBUMIN 2.8* 2.7*  AST 123* 80*  ALT 83* 53*  ALKPHOS 106 99  BILITOT 1.9* 1.7*  BILIDIR 0.5*  --   IBILI 1.4*  --     Microbiology: Micro 05/14/22 Gram positive cocci- staphylococcus capri 05/16/2022 blood culture no growth  Assessment/Plan: 76 year old male with history of TAVR, cirrhosis, status post TIPS, diabetes mellitus, severe thrombocytopenia presenting with fall confusion and fever Sepsis Coag negative staph bacteremia secondary to [place]  patient has TAVR and hence concern for endocarditis and TEE did not show any. Currently on cefazolin Will need for 4 weeks minimum   MRI of the lumbar spine no evidence of discitis or osteomyelitis Repeat blood culture has been negative  Poor  dentition.  Waiting extraction of all teeth as outpatient  Confusion from hepatic encephalopathy  AKI resolved  Diabetes mellitus on insulin  Cirrhosis of liver with decompensation.  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     OPAT Orders Diagnosis: Staphylococcus capri bacteremia TAVR Baseline Creatinine 1.31    Allergies  Allergen Reactions   Lisinopril Hives   Sulfamethoxazole Swelling   Camphor Rash and Swelling   Latex Rash   Nickel Rash  Discharge antibiotics: Cefazolin 2 grams Iv every 8 hours until 06/20/22  [facility] Care Per Protocol:  Labs weekly on Monday while on IV antibiotics: _X_ CBC with differential  _X_ CMP    _X_ Please pull PIC at completion of IV antibiotics   Fax weekly labs to [phone]  Clinic Follow Up Appt:   Call [phone] to make appt 06/13/22 at 10.15 am

## 2022-05-21 NOTE — Progress Notes (Signed)
PROGRESS NOTE  Joshua Berry [id number] DOB: 10/23/1946   PCP: Lorelei Pont, MD  Patient is from: Home.  Lives with wife.  Reports using walker and cane at baseline.  DOA: 05/14/2022 LOS: 7  Chief complaints Chief Complaint  Patient presents with   Altered Mental Status     Brief Narrative / Interim history: 76 year old M with PMH of liver cirrhosis, portal HTN, GIB/GAVE, diastolic CHF, pancytopenia, OSA, TAVR, poor dentition and chronic right shoulder pain presenting with malaise and altered mental status and admitted to ICU with septic shock due to coagulase-negative staph bacteremia, and AKI requiring vasopressors and IV antibiotics.  BC ID with Staphylococcus species but not aureus or MRSA.  MRSA PCR screen negative.  ID consulted.    Patient was weaned off vasopressor and transferred to [facility] on 05/16/2022. Repeat blood culture NGTD.  TTE without significant finding or vegetation.  MRI lumbar spine with significant DDD but no discitis or osteomyelitis.  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  ID recommends TEE.  Cardiology consulted.   Subjective: Seen and examined earlier this morning.  Sitting in bed. No complaints. Tolerating diet.  Objective: Vitals:   05/21/22 0355 05/21/22 0435 05/21/22 0719 05/21/22 0730  BP: (!) 87/47  (!) 106/57 (!) 102/58  Pulse: 67  67 68  Resp:    18  Temp:    98.5 F (36.9 C)  TempSrc:    Oral  SpO2:    97%  Weight:  102.6 kg    Height:        Examination:  GENERAL: No apparent distress.  Nontoxic. NECK: Supple.   RESP:  No IWOB.  Fair aeration bilaterally. CVS:  RRR. Moderate holosystolic murmur ABD/GI/GU: BS+. Abd soft, NTND.  MSK/EXT:  Moves extremities. No apparent deformity. No edema.  SKIN: no apparent skin lesion or wound NEURO: Awake but not quite alert.  Alert and oriented PSYCH: Calm. Normal affect.   Procedures:  None  Microbiology summarized: 5/30-COVID-19 and influenza PCR  nonreactive. 5/30-MRSA PCR screen negative. 5/30-blood culture Staphylococcus caprae  Assessment and Plan: Septic shock due to Staphylococcus bacteremia Suspect odontogenic source given poor dentition.  Blood culture with Staphylococcus Caprae.  MRSA PCR screen negative.  Sepsis physiology resolved.  No significant finding on TTE.  Repeat blood cultures NGTD.  MRI lumbar spine with DDD but no suspicion for discitis osteomyelitis. -Infectious disease following. -Vancomycin 5/30>> IV Ancef 6/2>>> -CTX and Zithromax 5/30-5/31. -TEE today -Dental f/u outpt  AKI (acute kidney injury) ([place]) Recent Labs  On presentation and again with today's labs after re-start of home lasix yesterday - hold lasix - 500 cc bolus - monitor  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 Looks compensated.  Hx TIPS. Mild LFT elevation with mild hyperbilirubinemia likely from sepsis. One bm last 24. Not encephalopathic -Continue rifaximin -lactulose dose increased yesterday, may need further increases. -Discontinued tramadol and oxycodone -Monitor LFT and ammonia intermittently - home lasix on hold  Fall at home, initial encounter Reportedly slipped and fell at home.  No notable injury.  CT head and cervical spine and MRI lumbar spine without acute finding. -Fall precaution -PT/OT advising snf, family declines, but patient wants, TOC to address w/ family and patient today  Hypotension Resolved.   -Continue low-dose midodrine  Chronic diastolic CHF (congestive heart failure) ([place]) TTE in 2020 with LVEF of 55 to 65%.  Seems to be on p.o. Lasix 20 mg daily at home.  Appears euvolemic on exam.  TTE without significant  finding.  Controlled IDDM-2 with hyperglycemia A1c 6.1% about 1 month ago.  Uses Lantus 16 units daily and NovoLog 10 units 3 times daily Recent Labs  Lab 05/18/22 1145 05/18/22 1621 05/19/22 0725 05/19/22 1046 05/19/22 1138  GLUCAP 262* 251* 167* 144* 146*   -Continue SSI-moderate -Continue Semglee 20 units at night -Continue NovoLog 5 units 3 times daily with meals -Further adjustment as appropriate.  Hypokalemia and hypomagnesemia Monitor and replenish as appropriate  Acute hepatic encephalopathy (HCC) Resolved  Back pain Lumbar MRI with DDD but no concern for discitis or osteomyelitis.  No fracture. -OOB, PT/OT and pain control.  Lactic acidosis Resolved.  Obesity (BMI 30-39.9) Body mass index is 30.57 kg/m.  Physical deconditioning PT/OT  Hypothyroidism Continue home Synthroid.  Right shoulder pain Chronic.  Unchanged per patient.  Seems to have improved. -Tylenol as needed  Pancytopenia ([place]) Likely due to liver cirrhosis.  Leukopenia resolved.  Thrombocytopenia improving.  H&H stable. -Continue monitoring  Portal hypertension ([place]) S/p tips. Doesn't appear volume overloaded    DVT prophylaxis:  SCDs Start: 05/14/22 1608  Code Status: Full code Family Communication: daughter updated telephonically 6/5. Wife updated telephonically 6/6 Level of care: Med-Surg Status is: Inpatient Remains inpatient appropriate because: Staphylococcus bacteremia and further evaluation to rule out endocarditis   Final disposition: SNF Consultants:  Pulmonology admitted patient Infectious disease Cardiology  Sch Meds:  Scheduled Meds:  aspirin EC  81 mg Oral Daily   atorvastatin  20 mg Oral QHS   Chlorhexidine Gluconate Cloth  6 each Topical Q0600   insulin aspart  0-15 Units Subcutaneous TID WC   insulin aspart  0-5 Units Subcutaneous QHS   insulin aspart  5 Units Subcutaneous TID WC   insulin glargine-yfgn  20 Units Subcutaneous QHS   lactulose  40 g Oral TID   levothyroxine  100 mcg Oral Q0600   lidocaine  1 patch Transdermal Q24H   midodrine  5 mg Oral TID WC   pantoprazole  40 mg Oral Daily   rifaximin  550 mg Oral BID   terazosin  5 mg Oral QHS   Continuous Infusions:  sodium chloride 20 mL/hr at 05/21/22  0605   sodium chloride Stopped (05/18/22 2229)    ceFAZolin (ANCEF) IV 2 g (05/21/22 0606)   PRN Meds:.sodium chloride, acetaminophen, ketotifen, polyvinyl alcohol  Antimicrobials: Anti-infectives (From admission, onward)    Start     Dose/Rate Route Frequency Ordered Stop   05/17/22 2200  ceFAZolin (ANCEF) IVPB 2g/100 mL premix        2 g 200 mL/hr over 30 Minutes Intravenous Every 8 hours 05/17/22 1424     05/15/22 1400  rifaximin (XIFAXAN) tablet 550 mg        550 mg Oral 2 times daily 05/15/22 1229     05/15/22 1000  vancomycin (VANCOREADY) IVPB 2000 mg/400 mL  Status:  Discontinued        2,000 mg 200 mL/hr over 120 Minutes Intravenous Every 24 hours 05/15/22 0306 05/17/22 1424   05/14/22 2000  cefTRIAXone (ROCEPHIN) 2 g in sodium chloride 0.9 % 100 mL IVPB  Status:  Discontinued        2 g 200 mL/hr over 30 Minutes Intravenous Every 24 hours 05/14/22 1617 05/15/22 0257   05/14/22 1800  azithromycin (ZITHROMAX) 500 mg in sodium chloride 0.9 % 250 mL IVPB  Status:  Discontinued        500 mg 250 mL/hr over 60 Minutes Intravenous Every 24 hours 05/14/22 1617 05/15/22  1512   05/14/22 1330  vancomycin (VANCOREADY) IVPB 1250 mg/250 mL        1,250 mg 166.7 mL/hr over 90 Minutes Intravenous  Once 05/14/22 1220 05/14/22 1435   05/14/22 1215  ceFEPIme (MAXIPIME) 2 g in sodium chloride 0.9 % 100 mL IVPB        2 g 200 mL/hr over 30 Minutes Intravenous  Once 05/14/22 1202 05/14/22 1300   05/14/22 1215  vancomycin (VANCOCIN) IVPB 1000 mg/200 mL premix        1,000 mg 200 mL/hr over 60 Minutes Intravenous  Once 05/14/22 1202 05/14/22 1331        I have personally reviewed the following labs and images: CBC: Recent Labs  Lab 05/15/22 0323 05/16/22 0441 05/17/22 0550 05/19/22 0348 05/21/22 0156  WBC 3.8* 4.8 4.7 4.4 5.1  NEUTROABS  --   --   --  2.8  --   HGB 10.1* 9.8* 10.7* 11.8* 10.8*  HCT 29.0* 28.6* 30.1* 33.2* 31.2*  MCV 92.7 92.9 91.8 93.5 92.9  PLT 26* 26* 40* 76*  62*   BMP &GFR Recent Labs  Lab 05/14/22 1609 05/14/22 1609 05/15/22 0323 05/16/22 0441 05/17/22 0550 05/18/22 0544 05/20/22 0732 05/21/22 0156  NA  --    < > 136 136 135 134* 134* 135  K  --    < > 4.0 3.7 3.9 3.4* 3.9 3.6  CL  --    < > 108 107 104 102 103 105  CO2  --    < > 23 24 27 26 23 29   GLUCOSE  --    < > 193* 167* 155* 145* 158* 147*  BUN  --    < > 17 15 11 10 8 10   CREATININE  --    < > 1.26* 0.96 0.80 0.74 0.76 1.31*  CALCIUM  --    < > 8.0* 8.2* 8.1* 8.3* 8.6* 8.6*  MG 1.7  --  1.5* 1.7 1.6* 1.6* 1.5* 2.0  PHOS 2.8  --  3.3  --  2.6 3.1 3.2  --    < > = values in this interval not displayed.   Estimated Creatinine Clearance: 60.4 mL/min (A) (by C-G formula based on SCr of 1.31 mg/dL (H)). Liver & Pancreas: Recent Labs  Lab 05/16/22 0441 05/17/22 0550 05/18/22 0544 05/19/22 0348 05/20/22 0732  AST 80* 78* 87* 123* 80*  ALT 47* 51* 62* 83* 53*  ALKPHOS 63 72 84 106 99  BILITOT 1.8* 2.2* 2.0* 1.9* 1.7*  PROT 5.5* 6.0* 6.0* 6.4* 6.1*  ALBUMIN 2.6* 2.8* 2.7* 2.8* 2.7*   No results for input(s): LIPASE, AMYLASE in the last 168 hours. Recent Labs  Lab 05/17/22 0550 05/18/22 0544 05/19/22 0348 05/20/22 0732  AMMONIA 53* 71* 26 42*   Diabetic: No results for input(s): HGBA1C in the last 72 hours. Recent Labs  Lab 05/20/22 1122 05/20/22 1700 05/20/22 2019 05/21/22 0731 05/21/22 1121  GLUCAP 142* 176* 213* 164* 192*   Cardiac Enzymes: No results for input(s): CKTOTAL, CKMB, CKMBINDEX, TROPONINI in the last 168 hours. No results for input(s): PROBNP in the last 8760 hours. Coagulation Profile: Recent Labs  Lab 05/17/22 0550 05/18/22 0544  INR 1.3* 1.3*   Thyroid Function Tests: Recent Labs    05/20/22 0732  TSH 2.477   Lipid Profile: No results for input(s): CHOL, HDL, LDLCALC, TRIG, CHOLHDL, LDLDIRECT in the last 72 hours. Anemia Panel: No results for input(s): VITAMINB12, FOLATE, FERRITIN, TIBC, IRON, RETICCTPCT in the  last 72  hours. Urine analysis:    Component Value Date/Time   COLORURINE YELLOW (A) 05/14/2022 1104   APPEARANCEUR CLEAR (A) 05/14/2022 1104   APPEARANCEUR [place] 05/01/2014 1653   LABSPEC 1.021 05/14/2022 1104   LABSPEC 1.015 05/01/2014 1653   PHURINE 7.0 05/14/2022 1104   GLUCOSEU NEGATIVE 05/14/2022 1104   GLUCOSEU Negative 05/01/2014 [place] 05/14/2022 [place] 05/14/2022 1104   BILIRUBINUR Negative 05/01/2014 [place] 05/14/2022 [place] 05/14/2022 1104   NITRITE NEGATIVE 05/14/2022 [place] 05/14/2022 1104   LEUKOCYTESUR Negative 05/01/2014 1653   Sepsis Labs: Invalid input(s): PROCALCITONIN, [place]  Microbiology: Recent Results (from the past 240 hour(s))  Blood Culture (routine x 2)     Status: Abnormal   Collection Time: 05/14/22 11:06 AM   Specimen: BLOOD  Result Value Ref Range Status   Specimen Description   Final    BLOOD BLOOD RIGHT FOREARM Performed at [facility], [address] [place], [place] [address]    Special Requests   Final    BOTTLES DRAWN AEROBIC AND ANAEROBIC Blood Culture adequate volume Performed at [facility], [address] [place], [place] [address]    Culture  Setup Time   Final    Organism ID to follow GRAM POSITIVE COCCI IN BOTH AEROBIC AND ANAEROBIC BOTTLES CRITICAL RESULT CALLED TO, READ BACK BY AND VERIFIED WITH: NATHAN BELUTH @ 0277 ON 05/15/2022.Marland KitchenMarland KitchenTKR    Culture STAPHYLOCOCCUS CAPRAE (A)  Final   Report Status 05/17/2022 FINAL  Final   Organism ID, Bacteria STAPHYLOCOCCUS CAPRAE  Final      Susceptibility   Staphylococcus caprae - MIC*    CIPROFLOXACIN 1 SENSITIVE Sensitive     ERYTHROMYCIN <=0.25 SENSITIVE Sensitive     GENTAMICIN <=0.5 SENSITIVE Sensitive     OXACILLIN <=0.25 SENSITIVE Sensitive     TETRACYCLINE <=1 SENSITIVE Sensitive     VANCOMYCIN <=0.5 SENSITIVE Sensitive     TRIMETH/SULFA <=10 SENSITIVE  Sensitive     CLINDAMYCIN <=0.25 SENSITIVE Sensitive     RIFAMPIN >=32 RESISTANT Resistant     Inducible Clindamycin NEGATIVE Sensitive     * STAPHYLOCOCCUS CAPRAE  Blood Culture ID Panel (Reflexed)     Status: Abnormal   Collection Time: 05/14/22 11:06 AM  Result Value Ref Range Status   Enterococcus faecalis NOT DETECTED NOT DETECTED Final   Enterococcus Faecium NOT DETECTED NOT DETECTED Final   Listeria monocytogenes NOT DETECTED NOT DETECTED Final   Staphylococcus species DETECTED (A) NOT DETECTED Final    Comment: NATHAN BELUTH @ 4128 ON 05/15/2022.Marland KitchenMarland KitchenTKR   Staphylococcus aureus (BCID) NOT DETECTED NOT DETECTED Final   Staphylococcus epidermidis NOT DETECTED NOT DETECTED Final   Staphylococcus lugdunensis NOT DETECTED NOT DETECTED Final   Streptococcus species NOT DETECTED NOT DETECTED Final   Streptococcus agalactiae NOT DETECTED NOT DETECTED Final   Streptococcus pneumoniae NOT DETECTED NOT DETECTED Final   Streptococcus pyogenes NOT DETECTED NOT DETECTED Final   A.calcoaceticus-baumannii NOT DETECTED NOT DETECTED Final   Bacteroides fragilis NOT DETECTED NOT DETECTED Final   Enterobacterales NOT DETECTED NOT DETECTED Final   Enterobacter cloacae complex NOT DETECTED NOT DETECTED Final   Escherichia coli NOT DETECTED NOT DETECTED Final   Klebsiella aerogenes NOT DETECTED NOT DETECTED Final   Klebsiella oxytoca NOT DETECTED NOT DETECTED Final   Klebsiella pneumoniae NOT DETECTED NOT DETECTED Final   Proteus species NOT DETECTED NOT DETECTED Final   Salmonella species  NOT DETECTED NOT DETECTED Final   Serratia marcescens NOT DETECTED NOT DETECTED Final   Haemophilus influenzae NOT DETECTED NOT DETECTED Final   Neisseria meningitidis NOT DETECTED NOT DETECTED Final   Pseudomonas aeruginosa NOT DETECTED NOT DETECTED Final   Stenotrophomonas maltophilia NOT DETECTED NOT DETECTED Final   Candida albicans NOT DETECTED NOT DETECTED Final   Candida auris NOT DETECTED NOT DETECTED  Final   Candida glabrata NOT DETECTED NOT DETECTED Final   Candida krusei NOT DETECTED NOT DETECTED Final   Candida parapsilosis NOT DETECTED NOT DETECTED Final   Candida tropicalis NOT DETECTED NOT DETECTED Final   Cryptococcus neoformans/gattii NOT DETECTED NOT DETECTED Final    Comment: Performed at [facility], [address] [place], [place] [address]  Blood Culture (routine x 2)     Status: Abnormal   Collection Time: 05/14/22 11:22 AM   Specimen: BLOOD  Result Value Ref Range Status   Specimen Description   Final    BLOOD RIGHT ARM Performed at [facility], [address], [place], [place] [address]    Special Requests   Final    BOTTLES DRAWN AEROBIC AND ANAEROBIC Blood Culture results may not be optimal due to an excessive volume of blood received in culture bottles Performed at [facility], [address] [place], [place] [address]    Culture  Setup Time   Final    GRAM POSITIVE COCCI IN BOTH AEROBIC AND ANAEROBIC BOTTLES CRITICAL VALUE NOTED.  VALUE IS CONSISTENT WITH PREVIOUSLY REPORTED AND CALLED VALUE.    Culture (A)  Final    STAPHYLOCOCCUS CAPRAE SUSCEPTIBILITIES PERFORMED ON PREVIOUS CULTURE WITHIN THE LAST 5 DAYS. Performed at [facility], [place] [address] [place], [place] [address]    Report Status 05/17/2022 FINAL  Final  Resp Panel by RT-PCR (Flu A&B, Covid) Anterior Nasal Swab     Status: None   Collection Time: 05/14/22 12:04 PM   Specimen: Anterior Nasal Swab  Result Value Ref Range Status   SARS Coronavirus 2 by RT PCR NEGATIVE NEGATIVE Final    Comment: (NOTE) SARS-CoV-2 target nucleic acids are NOT DETECTED.  The SARS-CoV-2 RNA is generally detectable in upper respiratory specimens during the acute phase of infection. The lowest concentration of SARS-CoV-2 viral copies this assay can detect is 138 copies/mL. A negative result does not preclude SARS-Cov-2 infection and should not be used as the sole basis for  treatment or other patient management decisions. A negative result may occur with  improper specimen collection/handling, submission of specimen other than nasopharyngeal swab, presence of viral mutation(s) within the areas targeted by this assay, and inadequate number of viral copies(<138 copies/mL). A negative result must be combined with clinical observations, patient history, and epidemiological information. The expected result is Negative.  Fact Sheet for Patients:  EntrepreneurPulse.com.au  Fact Sheet for Healthcare Providers:  IncredibleEmployment.be  This test is no t yet approved or cleared by the [place] FDA and  has been authorized for detection and/or diagnosis of SARS-CoV-2 by FDA under an Emergency Use Authorization (EUA). This EUA will remain  in effect (meaning this test can be used) for the duration of the COVID-19 declaration under Section 564(b)(1) of the Act, 21 U.S.C.section 360bbb-3(b)(1), unless the authorization is terminated  or revoked sooner.       Influenza A by PCR NEGATIVE NEGATIVE Final   Influenza B by PCR NEGATIVE NEGATIVE Final    Comment: (NOTE) The Xpert Xpress SARS-CoV-2/FLU/RSV plus assay is intended  as an aid in the diagnosis of influenza from Nasopharyngeal swab specimens and should not be used as a sole basis for treatment. Nasal washings and aspirates are unacceptable for Xpert Xpress SARS-CoV-2/FLU/RSV testing.  Fact Sheet for Patients: EntrepreneurPulse.com.au  Fact Sheet for Healthcare Providers: IncredibleEmployment.be  This test is not yet approved or cleared by the [place] FDA and has been authorized for detection and/or diagnosis of SARS-CoV-2 by FDA under an Emergency Use Authorization (EUA). This EUA will remain in effect (meaning this test can be used) for the duration of the COVID-19 declaration under Section 564(b)(1) of the Act, 21  U.S.C. section 360bbb-3(b)(1), unless the authorization is terminated or revoked.  Performed at [facility], [address] [place], [place] [address]   MRSA Next Gen by PCR, Nasal     Status: None   Collection Time: 05/14/22  9:29 PM   Specimen: Nasal Mucosa; Nasal Swab  Result Value Ref Range Status   MRSA by PCR Next Gen NOT DETECTED NOT DETECTED Final    Comment: (NOTE) The GeneXpert MRSA Assay (FDA approved for NASAL specimens only), is one component of a comprehensive MRSA colonization surveillance program. It is not intended to diagnose MRSA infection nor to guide or monitor treatment for MRSA infections. Test performance is not FDA approved in patients less than 99 years old. Performed at [facility], [address] [place], [place] [address]   Culture, blood (Routine X 2) w Reflex to ID Panel     Status: None   Collection Time: 05/16/22  3:30 PM   Specimen: BLOOD  Result Value Ref Range Status   Specimen Description BLOOD LAC  Final   Special Requests BOTTLES DRAWN AEROBIC AND ANAEROBIC BCAV  Final   Culture   Final    NO GROWTH 5 DAYS Performed at [facility], [address], [place], [place] [address]    Report Status 05/21/2022 FINAL  Final  Culture, blood (Routine X 2) w Reflex to ID Panel     Status: None   Collection Time: 05/16/22  3:35 PM   Specimen: BLOOD  Result Value Ref Range Status   Specimen Description BLOOD RAC  Final   Special Requests BOTTLES DRAWN AEROBIC AND ANAEROBIC BCAV  Final   Culture   Final    NO GROWTH 5 DAYS Performed at [facility], [address], [place], [place] [address]    Report Status 05/21/2022 FINAL  Final    Radiology Studies: No results found.   Laurey Arrow, MD [facility]  If 7PM-7AM, please contact night-coverage [email or web address] 05/21/2022, 11:46 AM

## 2022-05-21 NOTE — TOC Progression Note (Signed)
Transition of Care [facility]) - Progression Note    Patient Details  Name: Joshua Berry MRN: [id number] Date of Birth: [birth year]  Transition of Care [facility]) CM/SW [place], LCSW Phone Number: 05/21/2022, 12:43 PM  Clinical Narrative:   Patient off unit for TEE. Will discuss [facility] SN[facility]'s when he returns.  Expected Discharge Plan: [place] Barriers to Discharge: Continued Medical Work up  Expected Discharge Plan and Services Expected Discharge Plan: [place] arrangements for the past 2 months: [place] Agency: [place] Date [place]: 05/17/22   Representative spoke with at [place]: [place] (formerly Nanine Means [facility])   Social Determinants of Health ([place]) Interventions    Readmission Risk Interventions    05/17/2022    1:45 PM  Readmission Risk Prevention Plan  Transportation Screening Complete  PCP or Specialist Appt within 5-7 Days Complete  Home Care Screening Complete  Medication Review (RN CM) Complete

## 2022-05-21 NOTE — Progress Notes (Signed)
Occupational Therapy Treatment Patient Details Name: Joshua Berry MRN: [id number] DOB: 1946-12-09 Today's Date: 05/21/2022   History of present illness Pt is a 76 y/o M who presented with c/o malaise & AMS. Vital signs showed shock physiology. PMH: DM, Liver cirrhosis, dCHF, essential HTN, dyslipidemia, arthritis, heart murmur, NASH, sleep apnea   OT comments  Pt seen for OT treatment on this date. Upon arrival to room pt awake, states "I want to not get up but I have to say yes because I'm going home" and agrees to treatment. Pt requires SETUP tooth brushing in sitting - pt defers trialing in standing citing fatigue. MIN A + RW for step pivot t/f bed>chair. Reviewed HEP and importance of mobility. Pt making good progress toward goals. Will continue to follow POC. Discharge recommendation remains appropriate.     Recommendations for follow up therapy are one component of a multi-disciplinary discharge planning process, led by the attending physician.  Recommendations may be updated based on patient status, additional functional criteria and insurance authorization.    Follow Up Recommendations  Skilled nursing-short term rehab (<3 hours/day)    Assistance Recommended at Discharge Frequent or constant Supervision/Assistance  Patient can return home with the following  A little help with walking and/or transfers;A little help with bathing/dressing/bathroom;Help with stairs or ramp for entrance   Equipment Recommendations  BSC/3in1    Recommendations for Other Services      Precautions / Restrictions Precautions Precautions: Fall Restrictions Weight Bearing Restrictions: No       Mobility Bed Mobility Overal bed mobility: Needs Assistance Bed Mobility: Supine to Sit     Supine to sit: Min assist          Transfers Overall transfer level: Needs assistance Equipment used: Rolling walker (2 wheels) Transfers: Sit to/from Stand Sit to Stand: Min assist                  Balance Overall balance assessment: Needs assistance Sitting-balance support: Feet supported, No upper extremity supported Sitting balance-Leahy Scale: Good     Standing balance support: Bilateral upper extremity supported Standing balance-Leahy Scale: Fair                             ADL either performed or assessed with clinical judgement   ADL Overall ADL's : Needs assistance/impaired                                       General ADL Comments: SETUP tooth brushing in sitting - pt defers trialing in standing citing fatigue. MIN A + RW for ADL t/f.      Cognition Arousal/Alertness: Awake/alert Behavior During Therapy: WFL for tasks assessed/performed Overall Cognitive Status: Within Functional Limits for tasks assessed                                 General Comments: states "I want to not get up but i have to say yes because i'm going home"                   Pertinent Vitals/ Pain       Pain Assessment Pain Assessment: No/denies pain   Frequency  Min 2X/week        Progress Toward Goals  OT Goals(current goals can now be  found in the care plan section)  Progress towards OT goals: Progressing toward goals  Acute Rehab OT Goals Patient Stated Goal: to go home OT Goal Formulation: With patient Time For Goal Achievement: 05/30/22 Potential to Achieve Goals: Good ADL Goals Pt Will Perform Grooming: with modified independence;standing Pt Will Perform Lower Body Dressing: sit to/from stand;with modified independence Pt Will Transfer to Toilet: with modified independence;ambulating;regular height toilet  Plan Discharge plan remains appropriate;Frequency remains appropriate    Co-evaluation                 AM-PAC OT "6 Clicks" Daily Activity     Outcome Measure   Help from another person eating meals?: None Help from another person taking care of personal grooming?: A Little Help from another person  toileting, which includes using toliet, bedpan, or urinal?: A Lot Help from another person bathing (including washing, rinsing, drying)?: A Lot Help from another person to put on and taking off regular upper body clothing?: A Little Help from another person to put on and taking off regular lower body clothing?: A Lot 6 Click Score: 16    End of Session Equipment Utilized During Treatment: Rolling walker (2 wheels)  OT Visit Diagnosis: Other abnormalities of gait and mobility (R26.89);Muscle weakness (generalized) (M62.81)   Activity Tolerance Patient tolerated treatment well   Patient Left in chair;with call bell/phone within reach;with chair alarm set   Nurse Communication          Time: [phone] OT Time Calculation (min): 24 min  Charges: OT General Charges $OT Visit: 1 Visit OT Treatments $Self Care/Home Management : 23-37 mins  Dessie Coma, M.S. OTR/L  05/21/22, 9:56 AM  ascom [phone]

## 2022-05-21 NOTE — Progress Notes (Signed)
PT Cancellation Note  Patient Details Name: Joshua Berry MRN: [id number] DOB: 1946-12-01   Cancelled Treatment:    Reason Eval/Treat Not Completed: Other (comment).  Therapist chart reviewed and attempted to see pt.  Upon arrival to the room, pt pleasantly declined PT stating "I don't typically do PT at night".  Pt encouraged that it wasn't night time and we would be done before dinner arrived.  Pt declined again and stated he would be ready tomorrow morning.  Will re-attempt pt at later date/time as medically appropriate.    [name], PT, DPT 05/21/22, 5:25 PM

## 2022-05-22 ENCOUNTER — Encounter: Payer: Self-pay | Admitting: Cardiovascular Disease

## 2022-05-22 DIAGNOSIS — R6521 Severe sepsis with septic shock: Secondary | ICD-10-CM | POA: Diagnosis not present

## 2022-05-22 DIAGNOSIS — A412 Sepsis due to unspecified staphylococcus: Secondary | ICD-10-CM | POA: Diagnosis not present

## 2022-05-22 LAB — BASIC METABOLIC PANEL
Anion gap: 5 (ref 5–15)
BUN: 13 mg/dL (ref 8–23)
CO2: 26 mmol/L (ref 22–32)
Calcium: 8.6 mg/dL — ABNORMAL LOW (ref 8.9–10.3)
Chloride: 104 mmol/L (ref 98–111)
Creatinine, Ser: 0.79 mg/dL (ref 0.61–1.24)
GFR, Estimated: >60 mL/min (ref >60)
Glucose, Bld: 151 mg/dL — ABNORMAL HIGH (ref 70–99)
Potassium: 3.7 mmol/L (ref 3.5–5.1)
Sodium: 135 mmol/L (ref 135–145)

## 2022-05-22 LAB — CBC
HCT: 29.4 % — ABNORMAL LOW (ref 39.0–52.0)
Hemoglobin: 10.1 g/dL — ABNORMAL LOW (ref 13.0–17.0)
MCH: 31.9 pg (ref 26.0–34.0)
MCHC: 34.4 g/dL (ref 30.0–36.0)
MCV: 92.7 fL (ref 80.0–100.0)
Platelets: 65 10*3/uL — ABNORMAL LOW (ref 150–400)
RBC: 3.17 MIL/uL — ABNORMAL LOW (ref 4.22–5.81)
RDW: 14.9 % (ref 11.5–15.5)
WBC: 4.6 10*3/uL (ref 4.0–10.5)
nRBC: 0 % (ref 0.0–0.2)

## 2022-05-22 LAB — GLUCOSE, CAPILLARY
Glucose-Capillary: 138 mg/dL — ABNORMAL HIGH (ref 70–99)
Glucose-Capillary: 255 mg/dL — ABNORMAL HIGH (ref 70–99)
Glucose-Capillary: 154 mg/dL — ABNORMAL HIGH (ref 70–99)
Glucose-Capillary: 180 mg/dL — ABNORMAL HIGH (ref 70–99)

## 2022-05-22 LAB — MAGNESIUM: Magnesium: 1.7 mg/dL (ref 1.7–2.4)

## 2022-05-22 MED ORDER — INSULIN ASPART 100 UNIT/ML IJ SOLN
8.0000 [IU] | Freq: Three times a day (TID) | INTRAMUSCULAR | Status: DC
  Administered 2022-05-23 (×3): 8 [IU] via SUBCUTANEOUS
  Filled 2022-05-22 (×3): qty 1

## 2022-05-22 NOTE — Progress Notes (Signed)
Physical Therapy Treatment Patient Details Name: Joshua Berry MRN: [id number] DOB: April 01, 1946 Today's Date: 05/22/2022   History of Present Illness Pt is a 76 y/o M who presented with c/o malaise & AMS. Vital signs showed shock physiology. PMH: DM, Liver cirrhosis, dCHF, essential HTN, dyslipidemia, arthritis, heart murmur, NASH, sleep apnea       Recommendations for follow up therapy are one component of a multi-disciplinary discharge planning process, led by the attending physician.  Recommendations may be updated based on patient status, additional functional criteria and insurance authorization.  Follow Up Recommendations  Skilled nursing-short term rehab (<3 hours/day)     Assistance Recommended at Discharge Frequent or constant Supervision/Assistance  Patient can return home with the following A little help with walking and/or transfers;A lot of help with bathing/dressing/bathroom;Assistance with cooking/housework;Direct supervision/assist for medications management;Help with stairs or ramp for entrance   Equipment Recommendations  Rolling walker (2 wheels)       Precautions / Restrictions Precautions Precautions: Fall Restrictions Weight Bearing Restrictions: No     Mobility  Bed Mobility Overal bed mobility: Needs Assistance Bed Mobility: Supine to Sit     Supine to sit: Min assist     General bed mobility comments: Pt was able to exit L side of bed with increased time and min assist    Transfers Overall transfer level: Needs assistance Equipment used: Rolling walker (2 wheels) Transfers: Sit to/from Stand Sit to Stand: Min assist    General transfer comment: min assist to stand from slightly elevated bed height. Vcs for hand placement and incraesed fwd wt shift    Ambulation/Gait Ambulation/Gait assistance: Min guard, Min assist Gait Distance (Feet): 15 Feet Assistive device: Rolling walker (2 wheels) Gait Pattern/deviations: Trunk flexed, Step-through  pattern Gait velocity: decreased     General Gait Details: pt fatigues quickly." I tried to walk to BR yesterday and almost didnt make it.      Balance Overall balance assessment: Needs assistance Sitting-balance support: Feet supported, No upper extremity supported Sitting balance-Leahy Scale: Good     Standing balance support: Bilateral upper extremity supported Standing balance-Leahy Scale: Fair         Cognition Arousal/Alertness: Awake/alert Behavior During Therapy: WFL for tasks assessed/performed Overall Cognitive Status: Within Functional Limits for tasks assessed      General Comments: pt is A and O x 3           General Comments General comments (skin integrity, edema, etc.): pt was self limiting. Unwilling to try to progress gait for perform more standing exercises. Limited by fatigue more so than strength      Pertinent Vitals/Pain Pain Assessment Pain Assessment: No/denies pain     PT Goals (current goals can now be found in the care plan section) Acute Rehab PT Goals Patient Stated Goal: " get stronger and go home." Progress towards PT goals: Progressing toward goals    Frequency    Min 2X/week      PT Plan Current plan remains appropriate    Co-evaluation     PT goals addressed during session: Mobility/safety with mobility;Balance;Proper use of DME;Strengthening/ROM        AM-PAC PT "6 Clicks" Mobility   Outcome Measure  Help needed turning from your back to your side while in a flat bed without using bedrails?: A Little Help needed moving from lying on your back to sitting on the side of a flat bed without using bedrails?: A Little Help needed moving to and from a bed  to a chair (including a wheelchair)?: A Little Help needed standing up from a chair using your arms (e.g., wheelchair or bedside chair)?: A Little Help needed to walk in hospital room?: A Little Help needed climbing 3-5 steps with a railing? : A Lot 6 Click Score:  17    End of Session   Activity Tolerance: Patient tolerated treatment well Patient left: in chair;with call bell/phone within reach;with chair alarm set Nurse Communication: Mobility status PT Visit Diagnosis: Unsteadiness on feet (R26.81);Muscle weakness (generalized) (M62.81);Difficulty in walking, not elsewhere classified (R26.2)     Time: 0940-1006 PT Time Calculation (min) (ACUTE ONLY): 26 min  Charges:  $Gait Training: 8-22 mins $Therapeutic Activity: 8-22 mins                    Julaine Fusi PTA 05/22/22, 11:12 AM

## 2022-05-22 NOTE — Treatment Plan (Signed)
  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

## 2022-05-22 NOTE — Progress Notes (Signed)
PHARMACY CONSULT NOTE FOR:  OUTPATIENT  PARENTERAL ANTIBIOTIC THERAPY (OPAT)  Indication: S. Caprae bacteremia Regimen: Cefazolin 2gm IV q8h End date: 06/20/2022  Please pull PICC at completion of IV antibiotics Fax weekly labs to [phone]  IV antibiotic discharge orders are pended. To discharging provider:  please sign these orders via discharge navigator,  Select New Orders & click on the button choice - Manage This Unsigned Work.     Thank you for allowing pharmacy to be a part of this patient's care.  Doreene Eland, PharmD, BCPS, BCIDP Work Cell: [phone] 05/22/2022 3:16 PM

## 2022-05-22 NOTE — Progress Notes (Signed)
PROGRESS NOTE  Joshua Berry PRX:[id number] DOB: 1946-12-14   PCP: Lorelei Pont, MD  Patient is from: Home.  Lives with wife.  Reports using walker and cane at baseline.  DOA: 05/14/2022 LOS: 8  Chief complaints Chief Complaint  Patient presents with   Altered Mental Status     Brief Narrative / Interim history: 76 year old M with PMH of liver cirrhosis, portal HTN, GIB/GAVE, diastolic CHF, pancytopenia, OSA, TAVR, poor dentition and chronic right shoulder pain presenting with malaise and altered mental status and admitted to ICU with septic shock due to coagulase-negative staph bacteremia, and AKI requiring vasopressors and IV antibiotics.  BC ID with Staphylococcus species but not aureus or MRSA.  MRSA PCR screen negative.  ID consulted.    Patient was weaned off vasopressor and transferred to [facility] service on 05/16/2022. Repeat blood culture NGTD.  TTE without significant finding or vegetation.  MRI lumbar spine with significant DDD but no discitis or osteomyelitis.  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  ID recommends TEE.  Cardiology consulted.   Subjective: Pt admitted to not sleeping well at nights.  Objective: Vitals:   05/21/22 1917 05/22/22 0345 05/22/22 0436 05/22/22 1541  BP: (!) 130/59 (!) 96/54  (!) 101/54  Pulse: 69 82  64  Resp: 18 18  16   Temp: 98.3 F (36.8 C) 98.3 F (36.8 C)  98.6 F (37 C)  TempSrc: Oral Oral  Oral  SpO2: 96% 97%  98%  Weight:   101.1 kg   Height:        Examination:  Constitutional: NAD, sleepy but arousable, oriented HEENT: conjunctivae and lids normal, EOMI CV: No cyanosis.   RESP: normal respiratory effort, on RA Extremities: No effusions, edema in BLE SKIN: warm, dry   Procedures:  None  Microbiology summarized: 5/30-COVID-19 and influenza PCR nonreactive. 5/30-MRSA PCR screen negative. 5/30-blood culture Staphylococcus caprae  Assessment and Plan: Septic shock due to Staphylococcus  bacteremia Suspect odontogenic source given poor dentition.  Blood culture with Staphylococcus Caprae.  MRSA PCR screen negative.  Sepsis physiology resolved.  Repeat blood cultures NGTD.  MRI lumbar spine with DDD but no suspicion for discitis osteomyelitis. --TEE neg -Infectious disease following. -Vancomycin 5/30>> IV Ancef 6/2>>> -CTX and Zithromax 5/30-5/31. Plan: --cont Ancef  AKI (acute kidney injury) ([place]) on presentation and again after re-starting of home lasix  Plan: --hold lasix  Cirrhosis of liver  Looks compensated.  Hx TIPS. Mild LFT elevation with mild hyperbilirubinemia likely from sepsis. One bm last 24. Not encephalopathic -Continue rifaximin --cont lactulose  Fall at home, initial encounter Reportedly slipped and fell at home.  No notable injury.  CT head and cervical spine and MRI lumbar spine without acute finding. -Fall precaution --SNF rehab  Hypotension Resolved.   --cont midodrine 5 mg BID  Chronic diastolic CHF (congestive heart failure) ([place]) TTE in 2020 with LVEF of 55 to 65%.  Seems to be on p.o. Lasix 20 mg daily at home.  Appears euvolemic on exam.  TTE without significant finding.  Controlled IDDM-2 with hyperglycemia A1c 6.1% about 1 month ago.   --cont glargine 20u nightly --increase mealtime insulin to 8u TID --SSI TID  Hypokalemia and hypomagnesemia Monitor and replenish as appropriate  Acute hepatic encephalopathy (HCC) Resolved --cont lactulose  Back pain Lumbar MRI with DDD but no concern for discitis or osteomyelitis.  No fracture.  Lactic acidosis Resolved.  Obesity (BMI 30-39.9) Body mass index is 30.57 kg/m.  Physical deconditioning PT/OT  Hypothyroidism Continue home  Synthroid.  Right shoulder pain Chronic.  Unchanged per patient.  Seems to have improved. -Tylenol as needed  Pancytopenia ([place]) Likely due to liver cirrhosis.  Leukopenia resolved.  Thrombocytopenia improving.  H&H stable. -Continue  monitoring  Portal hypertension ([place]) S/p tips. Doesn't appear volume overloaded    DVT prophylaxis:  SCDs Start: 05/14/22 1608  Code Status: Full code Family Communication:  Level of care: Med-Surg Status is: Inpatient  Final disposition: SNF  Sch Meds:  Scheduled Meds:  aspirin EC  81 mg Oral Daily   atorvastatin  20 mg Oral QHS   Chlorhexidine Gluconate Cloth  6 each Topical Q0600   insulin aspart  0-15 Units Subcutaneous TID WC   insulin aspart  0-5 Units Subcutaneous QHS   insulin aspart  5 Units Subcutaneous TID WC   insulin glargine-yfgn  20 Units Subcutaneous QHS   lactulose  40 g Oral TID   levothyroxine  100 mcg Oral Q0600   lidocaine  1 patch Transdermal Q24H   midodrine  5 mg Oral TID WC   pantoprazole  40 mg Oral Daily   rifaximin  550 mg Oral BID   terazosin  5 mg Oral QHS   Continuous Infusions:  sodium chloride Stopped (05/21/22 1413)    ceFAZolin (ANCEF) IV 2 g (05/22/22 1531)   PRN Meds:.sodium chloride, acetaminophen, ketotifen, polyvinyl alcohol  Antimicrobials: Anti-infectives (From admission, onward)    Start     Dose/Rate Route Frequency Ordered Stop   05/17/22 2200  ceFAZolin (ANCEF) IVPB 2g/100 mL premix        2 g 200 mL/hr over 30 Minutes Intravenous Every 8 hours 05/17/22 1424     05/15/22 1400  rifaximin (XIFAXAN) tablet 550 mg        550 mg Oral 2 times daily 05/15/22 1229     05/15/22 1000  vancomycin (VANCOREADY) IVPB 2000 mg/400 mL  Status:  Discontinued        2,000 mg 200 mL/hr over 120 Minutes Intravenous Every 24 hours 05/15/22 0306 05/17/22 1424   05/14/22 2000  cefTRIAXone (ROCEPHIN) 2 g in sodium chloride 0.9 % 100 mL IVPB  Status:  Discontinued        2 g 200 mL/hr over 30 Minutes Intravenous Every 24 hours 05/14/22 1617 05/15/22 0257   05/14/22 1800  azithromycin (ZITHROMAX) 500 mg in sodium chloride 0.9 % 250 mL IVPB  Status:  Discontinued        500 mg 250 mL/hr over 60 Minutes Intravenous Every 24 hours 05/14/22  1617 05/15/22 1512   05/14/22 1330  vancomycin (VANCOREADY) IVPB 1250 mg/250 mL        1,250 mg 166.7 mL/hr over 90 Minutes Intravenous  Once 05/14/22 1220 05/14/22 1435   05/14/22 1215  ceFEPIme (MAXIPIME) 2 g in sodium chloride 0.9 % 100 mL IVPB        2 g 200 mL/hr over 30 Minutes Intravenous  Once 05/14/22 1202 05/14/22 1300   05/14/22 1215  vancomycin (VANCOCIN) IVPB 1000 mg/200 mL premix        1,000 mg 200 mL/hr over 60 Minutes Intravenous  Once 05/14/22 1202 05/14/22 1331        I have personally reviewed the following labs and images: CBC: Recent Labs  Lab 05/16/22 0441 05/17/22 0550 05/19/22 0348 05/21/22 0156 05/22/22 0542  WBC 4.8 4.7 4.4 5.1 4.6  NEUTROABS  --   --  2.8  --   --   HGB 9.8* 10.7* 11.8* 10.8* 10.1*  HCT 28.6* 30.1* 33.2* 31.2* 29.4*  MCV 92.9 91.8 93.5 92.9 92.7  PLT 26* 40* 76* 62* 65*   BMP &GFR Recent Labs  Lab 05/17/22 0550 05/18/22 0544 05/20/22 0732 05/21/22 0156 05/22/22 0542  NA 135 134* 134* 135 135  K 3.9 3.4* 3.9 3.6 3.7  CL 104 102 103 105 104  CO2 27 26 23 29 26   GLUCOSE 155* 145* 158* 147* 151*  BUN 11 10 8 10 13   CREATININE 0.80 0.74 0.76 1.31* 0.79  CALCIUM 8.1* 8.3* 8.6* 8.6* 8.6*  MG 1.6* 1.6* 1.5* 2.0 1.7  PHOS 2.6 3.1 3.2  --   --    Estimated Creatinine Clearance: 98.2 mL/min (by C-G formula based on SCr of 0.79 mg/dL). Liver & Pancreas: Recent Labs  Lab 05/16/22 0441 05/17/22 0550 05/18/22 0544 05/19/22 0348 05/20/22 0732  AST 80* 78* 87* 123* 80*  ALT 47* 51* 62* 83* 53*  ALKPHOS 63 72 84 106 99  BILITOT 1.8* 2.2* 2.0* 1.9* 1.7*  PROT 5.5* 6.0* 6.0* 6.4* 6.1*  ALBUMIN 2.6* 2.8* 2.7* 2.8* 2.7*   No results for input(s): LIPASE, AMYLASE in the last 168 hours. Recent Labs  Lab 05/17/22 0550 05/18/22 0544 05/19/22 0348 05/20/22 0732  AMMONIA 53* 71* 26 42*   Diabetic: No results for input(s): HGBA1C in the last 72 hours. Recent Labs  Lab 05/21/22 1609 05/21/22 2010 05/22/22 0750  05/22/22 1139 05/22/22 1646  GLUCAP 164* 180* 138* 255* 154*   Cardiac Enzymes: No results for input(s): CKTOTAL, CKMB, CKMBINDEX, TROPONINI in the last 168 hours. No results for input(s): PROBNP in the last 8760 hours. Coagulation Profile: Recent Labs  Lab 05/17/22 0550 05/18/22 0544  INR 1.3* 1.3*   Thyroid Function Tests: Recent Labs    05/20/22 0732  TSH 2.477   Lipid Profile: No results for input(s): CHOL, HDL, LDLCALC, TRIG, CHOLHDL, LDLDIRECT in the last 72 hours. Anemia Panel: No results for input(s): VITAMINB12, FOLATE, FERRITIN, TIBC, IRON, RETICCTPCT in the last 72 hours. Urine analysis:    Component Value Date/Time   COLORURINE YELLOW (A) 05/14/2022 1104   APPEARANCEUR CLEAR (A) 05/14/2022 1104   APPEARANCEUR [place] 05/01/2014 1653   LABSPEC 1.021 05/14/2022 1104   LABSPEC 1.015 05/01/2014 1653   PHURINE 7.0 05/14/2022 1104   GLUCOSEU NEGATIVE 05/14/2022 1104   GLUCOSEU Negative 05/01/2014 [place] 05/14/2022 [place] 05/14/2022 1104   BILIRUBINUR Negative 05/01/2014 [place] 05/14/2022 [place] 05/14/2022 1104   NITRITE NEGATIVE 05/14/2022 [place] 05/14/2022 1104   LEUKOCYTESUR Negative 05/01/2014 1653   Sepsis Labs: Invalid input(s): PROCALCITONIN, [place]  Microbiology: Recent Results (from the past 240 hour(s))  Blood Culture (routine x 2)     Status: Abnormal   Collection Time: 05/14/22 11:06 AM   Specimen: BLOOD  Result Value Ref Range Status   Specimen Description   Final    BLOOD BLOOD RIGHT FOREARM Performed at [facility], [address], [place], [place] [address]    Special Requests   Final    BOTTLES DRAWN AEROBIC AND ANAEROBIC Blood Culture adequate volume Performed at [facility], [address] [place],  [address]    Culture  Setup Time   Final    Organism ID to follow GRAM POSITIVE COCCI IN BOTH AEROBIC AND  ANAEROBIC BOTTLES CRITICAL RESULT CALLED TO, READ BACK BY AND VERIFIED WITH: NATHAN BELUTH @ [phone] ON 05/15/2022.Marland KitchenMarland KitchenTKR  Culture STAPHYLOCOCCUS CAPRAE (A)  Final   Report Status 05/17/2022 FINAL  Final   Organism ID, Bacteria STAPHYLOCOCCUS CAPRAE  Final      Susceptibility   Staphylococcus caprae - MIC*    CIPROFLOXACIN 1 SENSITIVE Sensitive     ERYTHROMYCIN <=0.25 SENSITIVE Sensitive     GENTAMICIN <=0.5 SENSITIVE Sensitive     OXACILLIN <=0.25 SENSITIVE Sensitive     TETRACYCLINE <=1 SENSITIVE Sensitive     VANCOMYCIN <=0.5 SENSITIVE Sensitive     TRIMETH/SULFA <=10 SENSITIVE Sensitive     CLINDAMYCIN <=0.25 SENSITIVE Sensitive     RIFAMPIN >=32 RESISTANT Resistant     Inducible Clindamycin NEGATIVE Sensitive     * STAPHYLOCOCCUS CAPRAE  Blood Culture ID Panel (Reflexed)     Status: Abnormal   Collection Time: 05/14/22 11:06 AM  Result Value Ref Range Status   Enterococcus faecalis NOT DETECTED NOT DETECTED Final   Enterococcus Faecium NOT DETECTED NOT DETECTED Final   Listeria monocytogenes NOT DETECTED NOT DETECTED Final   Staphylococcus species DETECTED (A) NOT DETECTED Final    Comment: NATHAN BELUTH @ [phone] ON 05/15/2022.Marland KitchenMarland KitchenTKR   Staphylococcus aureus (BCID) NOT DETECTED NOT DETECTED Final   Staphylococcus epidermidis NOT DETECTED NOT DETECTED Final   Staphylococcus lugdunensis NOT DETECTED NOT DETECTED Final   Streptococcus species NOT DETECTED NOT DETECTED Final   Streptococcus agalactiae NOT DETECTED NOT DETECTED Final   Streptococcus pneumoniae NOT DETECTED NOT DETECTED Final   Streptococcus pyogenes NOT DETECTED NOT DETECTED Final   A.calcoaceticus-baumannii NOT DETECTED NOT DETECTED Final   Bacteroides fragilis NOT DETECTED NOT DETECTED Final   Enterobacterales NOT DETECTED NOT DETECTED Final   Enterobacter cloacae complex NOT DETECTED NOT DETECTED Final   Escherichia coli NOT DETECTED NOT DETECTED Final   Klebsiella aerogenes NOT DETECTED NOT DETECTED Final    Klebsiella oxytoca NOT DETECTED NOT DETECTED Final   Klebsiella pneumoniae NOT DETECTED NOT DETECTED Final   Proteus species NOT DETECTED NOT DETECTED Final   Salmonella species NOT DETECTED NOT DETECTED Final   Serratia marcescens NOT DETECTED NOT DETECTED Final   Haemophilus influenzae NOT DETECTED NOT DETECTED Final   Neisseria meningitidis NOT DETECTED NOT DETECTED Final   Pseudomonas aeruginosa NOT DETECTED NOT DETECTED Final   Stenotrophomonas maltophilia NOT DETECTED NOT DETECTED Final   Candida albicans NOT DETECTED NOT DETECTED Final   Candida auris NOT DETECTED NOT DETECTED Final   Candida glabrata NOT DETECTED NOT DETECTED Final   Candida krusei NOT DETECTED NOT DETECTED Final   Candida parapsilosis NOT DETECTED NOT DETECTED Final   Candida tropicalis NOT DETECTED NOT DETECTED Final   Cryptococcus neoformans/gattii NOT DETECTED NOT DETECTED Final    Comment: Performed at [facility], [address] [place], [place] [address]  Blood Culture (routine x 2)     Status: Abnormal   Collection Time: 05/14/22 11:22 AM   Specimen: BLOOD  Result Value Ref Range Status   Specimen Description   Final    BLOOD RIGHT ARM Performed at [facility], [address] [place], [place] [address]    Special Requests   Final    BOTTLES DRAWN AEROBIC AND ANAEROBIC Blood Culture results may not be optimal due to an excessive volume of blood received in culture bottles Performed at [facility], [address] [place], [place] [address]    Culture  Setup Time   Final    GRAM POSITIVE COCCI IN BOTH AEROBIC AND ANAEROBIC BOTTLES CRITICAL VALUE NOTED.  VALUE IS CONSISTENT WITH  PREVIOUSLY REPORTED AND CALLED VALUE.    Culture (A)  Final    STAPHYLOCOCCUS CAPRAE SUSCEPTIBILITIES PERFORMED ON PREVIOUS CULTURE WITHIN THE LAST 5 DAYS. Performed at [facility], [place] [address], [place], [place] [address]    Report Status 05/17/2022 FINAL  Final  Resp Panel by  RT-PCR (Flu A&B, Covid) Anterior Nasal Swab     Status: None   Collection Time: 05/14/22 12:04 PM   Specimen: Anterior Nasal Swab  Result Value Ref Range Status   SARS Coronavirus 2 by RT PCR NEGATIVE NEGATIVE Final    Comment: (NOTE) SARS-CoV-2 target nucleic acids are NOT DETECTED.  The SARS-CoV-2 RNA is generally detectable in upper respiratory specimens during the acute phase of infection. The lowest concentration of SARS-CoV-2 viral copies this assay can detect is 138 copies/mL. A negative result does not preclude SARS-Cov-2 infection and should not be used as the sole basis for treatment or other patient management decisions. A negative result may occur with  improper specimen collection/handling, submission of specimen other than nasopharyngeal swab, presence of viral mutation(s) within the areas targeted by this assay, and inadequate number of viral copies(<138 copies/mL). A negative result must be combined with clinical observations, patient history, and epidemiological information. The expected result is Negative.  Fact Sheet for Patients:  EntrepreneurPulse.com.au  Fact Sheet for Healthcare Providers:  IncredibleEmployment.be  This test is no t yet approved or cleared by the [place] FDA and  has been authorized for detection and/or diagnosis of SARS-CoV-2 by FDA under an Emergency Use Authorization (EUA). This EUA will remain  in effect (meaning this test can be used) for the duration of the COVID-19 declaration under Section 564(b)(1) of the Act, 21 U.S.C.section 360bbb-3(b)(1), unless the authorization is terminated  or revoked sooner.       Influenza A by PCR NEGATIVE NEGATIVE Final   Influenza B by PCR NEGATIVE NEGATIVE Final    Comment: (NOTE) The Xpert Xpress SARS-CoV-2/FLU/RSV plus assay is intended as an aid in the diagnosis of influenza from Nasopharyngeal swab specimens and should not be used as a sole basis for  treatment. Nasal washings and aspirates are unacceptable for Xpert Xpress SARS-CoV-2/FLU/RSV testing.  Fact Sheet for Patients: EntrepreneurPulse.com.au  Fact Sheet for Healthcare Providers: IncredibleEmployment.be  This test is not yet approved or cleared by the [place] FDA and has been authorized for detection and/or diagnosis of SARS-CoV-2 by FDA under an Emergency Use Authorization (EUA). This EUA will remain in effect (meaning this test can be used) for the duration of the COVID-19 declaration under Section 564(b)(1) of the Act, 21 U.S.C. section 360bbb-3(b)(1), unless the authorization is terminated or revoked.  Performed at [facility], [address] [place], [place] [address]   MRSA Next Gen by PCR, Nasal     Status: None   Collection Time: 05/14/22  9:29 PM   Specimen: Nasal Mucosa; Nasal Swab  Result Value Ref Range Status   MRSA by PCR Next Gen NOT DETECTED NOT DETECTED Final    Comment: (NOTE) The GeneXpert MRSA Assay (FDA approved for NASAL specimens only), is one component of a comprehensive MRSA colonization surveillance program. It is not intended to diagnose MRSA infection nor to guide or monitor treatment for MRSA infections. Test performance is not FDA approved in patients less than 74 years old. Performed at [facility], [address] [place],  [address]   Culture, blood (Routine X 2) w Reflex to ID Panel     Status:  None   Collection Time: 05/16/22  3:30 PM   Specimen: BLOOD  Result Value Ref Range Status   Specimen Description BLOOD LAC  Final   Special Requests BOTTLES DRAWN AEROBIC AND ANAEROBIC BCAV  Final   Culture   Final    NO GROWTH 5 DAYS Performed at [facility], [address] [place], [place] [address]    Report Status 05/21/2022 FINAL  Final  Culture, blood (Routine X 2) w Reflex to ID Panel     Status: None   Collection Time: 05/16/22  3:35 PM    Specimen: BLOOD  Result Value Ref Range Status   Specimen Description BLOOD RAC  Final   Special Requests BOTTLES DRAWN AEROBIC AND ANAEROBIC BCAV  Final   Culture   Final    NO GROWTH 5 DAYS Performed at [facility], [address], [place], [place] [address]    Report Status 05/21/2022 FINAL  Final    Radiology Studies: No results found.   If 7PM-7AM, please contact night-coverage [email or web address] 05/22/2022, 6:14 PM

## 2022-05-22 NOTE — NC FL2 (Signed)
[place] LEVEL OF CARE SCREENING TOOL     IDENTIFICATION  Patient Name: Joshua Berry Birthdate: [birth year] Sex: male Admission Date (Current Location): 05/14/2022  [place] and [place] Number:  Engineering geologist and Address:  [facility], [address], [place], [place] [address]      Provider Number: [id number]  Attending Physician Name and Address:  Enzo Bi, MD  Relative Name and Phone Number:       Current Level of Care: Hospital Recommended Level of Care: [place] Prior Approval Number:    Date Approved/Denied:   PASRR Number: [id number]  Discharge Plan: SNF    Current Diagnoses: Patient Active Problem List   Diagnosis Date Noted   Hyponatremia, hypokalemia and hypomagnesemia 05/18/2022   Hyponatremia 05/18/2022   Hypokalemia 05/18/2022   Back pain 05/17/2022   Hypomagnesemia 05/17/2022   Acute hepatic encephalopathy ([place]) 05/17/2022   AKI (acute kidney injury) ([place]) 05/16/2022   Elevated liver enzymes 05/16/2022   Hypotension 05/16/2022   Right shoulder pain 05/16/2022   Hypothyroidism 05/16/2022   Physical deconditioning 05/16/2022   Obesity (BMI 30-39.9) 05/16/2022   Lactic acidosis 05/16/2022   Fall at home, initial encounter 05/16/2022   Septic shock due to Staphylococcus bacteremia 05/14/2022   Chest pain 04/06/2022   Controlled IDDM-2 with hyperglycemia 04/06/2022   HLD (hyperlipidemia) 04/06/2022   HTN (hypertension) 04/06/2022   Chronic diastolic CHF (congestive heart failure) ([place]) 04/06/2022   Pancytopenia ([place]) 04/06/2022   GI bleeding 04/24/2021   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 Wound on right ear: No dressing.)                       Personal Care Assistance Level of Assistance  Bathing, Feeding, Dressing Bathing Assistance: Limited assistance Feeding assistance: Limited assistance Dressing Assistance: Limited assistance     Functional Limitations Info  Sight, Hearing, Speech Sight Info: Adequate Hearing Info: Adequate Speech Info: Adequate    SPECIAL CARE FACTORS FREQUENCY  PT (By licensed PT), OT (By licensed OT)     PT Frequency: 5 x week OT Frequency: 5 x week            Contractures Contractures Info: Not present    Additional Factors Info  Code Status, Allergies Code Status Info: Full code Allergies Info: Lisinopril, Sulfamethoxazole, Camphor, Latex, Nickel           Current Medications (05/22/2022):  This is the current hospital active medication list Current Facility-Administered Medications  Medication Dose Route Frequency Provider Last Rate Last Admin   0.9 %  sodium chloride infusion   Intravenous PRN Mercy Riding, MD   Stopped at 05/21/22 1413   acetaminophen (TYLENOL) tablet 650 mg  650 mg Oral Q8H PRN Wendee Beavers T, MD   650 mg at 05/22/22 1245   aspirin EC tablet 81 mg  81 mg Oral Daily Gwynne Edinger, MD   81 mg at 05/22/22 7622   atorvastatin (LIPITOR) tablet 20 mg  20 mg Oral QHS Gwynne Edinger, MD   20 mg at 05/21/22 2057   ceFAZolin (ANCEF) IVPB 2g/100 mL premix  2 g Intravenous Q8H Ravishankar, Joellyn Quails, MD 200 mL/hr at 05/22/22 0458 2 g at 05/22/22 0458   Chlorhexidine Gluconate Cloth 2 % PADS 6 each  6 each Topical Q0600 Ottie Glazier, MD   6 each at 05/21/22 0606    insulin aspart (novoLOG) injection 0-15 Units  0-15 Units Subcutaneous TID WC Wendee Beavers T, MD   8 Units at 05/22/22 1245   insulin aspart (novoLOG) injection 0-5 Units  0-5 Units Subcutaneous QHS Wendee Beavers T, MD   2 Units at 05/20/22 2103   insulin aspart (novoLOG) injection 5 Units  5 Units Subcutaneous TID WC Wendee Beavers T, MD   5 Units at 05/22/22 1246   insulin glargine-yfgn (SEMGLEE) injection 20 Units  20 Units Subcutaneous QHS Wendee Beavers T, MD   20 Units at 05/21/22 2057   ketotifen (ZADITOR) 0.025 % ophthalmic solution 1 drop  1 drop Both Eyes BID PRN Mercy Riding, MD       lactulose (CHRONULAC) 10 GM/15ML solution 40 g  40 g Oral TID Gwynne Edinger, MD   40 g at 05/21/22 1531   levothyroxine (SYNTHROID) tablet 100 mcg  100 mcg Oral Q0600 Dallie Piles, RPH   100 mcg at 05/22/22 0458   lidocaine (LIDODERM) 5 % 1 patch  1 patch Transdermal Q24H Ottie Glazier, MD   1 patch at 05/22/22 1242   midodrine (PROAMATINE) tablet 5 mg  5 mg Oral TID WC Wendee Beavers T, MD   5 mg at 05/22/22 1242   pantoprazole (PROTONIX) EC tablet 40 mg  40 mg Oral Daily Wendee Beavers T, MD   40 mg at 05/22/22 0906   polyvinyl alcohol (LIQUIFILM TEARS) 1.4 % ophthalmic solution 1 drop  1 drop Both Eyes PRN Mercy Riding, MD       rifaximin (XIFAXAN) tablet 550 mg  550 mg Oral BID Dallie Piles, RPH   550 mg at 05/22/22 6333   terazosin (HYTRIN) capsule 5 mg  5 mg Oral QHS Wouk, Ailene Rud, MD   5 mg at 05/21/22 2057     Discharge Medications: Please see discharge summary for a list of discharge medications.  Relevant Imaging Results:  Relevant Lab Results:   Additional Information SS#: [id number]. Will need Cefazolin 2 grams IV every 8 hours until 06/20/22.  Candie Chroman, LCSW

## 2022-05-22 NOTE — Plan of Care (Signed)

## 2022-05-22 NOTE — Progress Notes (Signed)
Mobility Specialist - Progress Note   05/22/22 1600  Mobility  Activity Transferred from chair to bed;Ambulated with assistance in room  Level of Assistance Standby assist, set-up cues, supervision of patient - no hands on  Assistive Device Front wheel walker  Distance Ambulated (ft) 5 ft  Activity Response Tolerated well  $Mobility charge 1 Mobility     Pt ambulated chair-bed with supervision per pt request. VC for hand placement to sit. Able to pull himself to [facility] with use of bed rails. Pt left semi-supine with alarm set, needs in reach, lunch tray in place.    Kathee Delton Mobility Specialist 05/22/22, 4:51 PM

## 2022-05-22 NOTE — TOC Progression Note (Addendum)
Transition of Care [facility]) - Progression Note    Patient Details  Name: Joshua Berry MRN: [id number] Date of Birth: 1946-08-11  Transition of Care [facility]) CM/SW [place], LCSW Phone Number: 05/22/2022, 2:24 PM  Clinical Narrative:   Received call from wife stating that patient is now agreeable to SNF. First preference is [facility] in [place]. Met with patient and daughter and confirmed. Faxed referral to [facility] for review. [facility] to Steva Ready at the [facility].   2:43 pm: Per Tye [place], patient is enrolled in the [facility]. CSW called them and confirmed he is 100% service connected. Faxed auth request to them.   Expected Discharge Plan: [place] Barriers to Discharge: Continued Medical Work up  Expected Discharge Plan and Services Expected Discharge Plan: [place] arrangements for the past 2 months: [place] Agency: [place] Date [place]: 05/17/22   Representative spoke with at [place]: [place] (formerly Nanine Means [facility])   Social Determinants of Health ([place]) Interventions    Readmission Risk Interventions    05/17/2022    1:45 PM  Readmission Risk Prevention Plan  Transportation Screening Complete  PCP or Specialist Appt within 5-7 Days Complete  Home Care Screening Complete  Medication Review (RN CM) Complete

## 2022-05-23 ENCOUNTER — Inpatient Hospital Stay: Payer: Self-pay

## 2022-05-23 DIAGNOSIS — B957 Other staphylococcus as the cause of diseases classified elsewhere: Secondary | ICD-10-CM | POA: Diagnosis not present

## 2022-05-23 DIAGNOSIS — R7881 Bacteremia: Secondary | ICD-10-CM | POA: Diagnosis not present

## 2022-05-23 DIAGNOSIS — A412 Sepsis due to unspecified staphylococcus: Secondary | ICD-10-CM | POA: Diagnosis not present

## 2022-05-23 DIAGNOSIS — R6521 Severe sepsis with septic shock: Secondary | ICD-10-CM | POA: Diagnosis not present

## 2022-05-23 LAB — BASIC METABOLIC PANEL
Anion gap: 4 — ABNORMAL LOW (ref 5–15)
BUN: 12 mg/dL (ref 8–23)
CO2: 27 mmol/L (ref 22–32)
Calcium: 8.6 mg/dL — ABNORMAL LOW (ref 8.9–10.3)
Chloride: 107 mmol/L (ref 98–111)
Creatinine, Ser: 0.77 mg/dL (ref 0.61–1.24)
GFR, Estimated: >60 mL/min (ref >60)
Glucose, Bld: 123 mg/dL — ABNORMAL HIGH (ref 70–99)
Potassium: 3.9 mmol/L (ref 3.5–5.1)
Sodium: 138 mmol/L (ref 135–145)

## 2022-05-23 LAB — CBC
HCT: 28.2 % — ABNORMAL LOW (ref 39.0–52.0)
Hemoglobin: 9.8 g/dL — ABNORMAL LOW (ref 13.0–17.0)
MCH: 32.1 pg (ref 26.0–34.0)
MCHC: 34.8 g/dL (ref 30.0–36.0)
MCV: 92.5 fL (ref 80.0–100.0)
Platelets: 64 10*3/uL — ABNORMAL LOW (ref 150–400)
RBC: 3.05 MIL/uL — ABNORMAL LOW (ref 4.22–5.81)
RDW: 14.7 % (ref 11.5–15.5)
WBC: 3.5 10*3/uL — ABNORMAL LOW (ref 4.0–10.5)
nRBC: 0 % (ref 0.0–0.2)

## 2022-05-23 LAB — GLUCOSE, CAPILLARY
Glucose-Capillary: 129 mg/dL — ABNORMAL HIGH (ref 70–99)
Glucose-Capillary: 287 mg/dL — ABNORMAL HIGH (ref 70–99)
Glucose-Capillary: 122 mg/dL — ABNORMAL HIGH (ref 70–99)
Glucose-Capillary: 168 mg/dL — ABNORMAL HIGH (ref 70–99)

## 2022-05-23 LAB — MAGNESIUM: Magnesium: 1.6 mg/dL — ABNORMAL LOW (ref 1.7–2.4)

## 2022-05-23 MED ORDER — METHOCARBAMOL 500 MG PO TABS
500.0000 mg | ORAL_TABLET | Freq: Once | ORAL | Status: AC
Start: 2022-05-23 — End: 2022-05-23
  Administered 2022-05-23: 500 mg via ORAL
  Filled 2022-05-23: qty 1

## 2022-05-23 MED ORDER — MAGNESIUM SULFATE 2 GM/50ML IV SOLN
2.0000 g | Freq: Once | INTRAVENOUS | Status: AC
  Administered 2022-05-23: 2 g via INTRAVENOUS
  Filled 2022-05-23: qty 50

## 2022-05-23 MED ORDER — INSULIN ASPART 100 UNIT/ML IJ SOLN
10.0000 [IU] | Freq: Three times a day (TID) | INTRAMUSCULAR | Status: DC
  Administered 2022-05-24 (×3): 10 [IU] via SUBCUTANEOUS
  Filled 2022-05-23 (×3): qty 1

## 2022-05-23 NOTE — Progress Notes (Signed)
Date of Admission:  05/14/2022      ID: Joshua Berry is a 76 y.o. 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   Portal hypertension (HCC)   Controlled IDDM-2 with hyperglycemia   Chronic diastolic CHF (congestive heart failure) (HCC)   Pancytopenia (HCC)   AKI (acute kidney injury) (HCC)   Elevated liver enzymes   Hypotension   Right shoulder pain   Hypothyroidism   Physical deconditioning   Obesity (BMI 30-39.9)   Lactic acidosis   Fall at home, initial encounter   Back pain   Hypomagnesemia   Acute hepatic encephalopathy (HCC)   Hyponatremia, hypokalemia and hypomagnesemia   Hyponatremia   Hypokalemia    Subjective: Pt says he is doing much better Happy they found a place in SNF No pain   Medications:   aspirin EC  81 mg Oral Daily   atorvastatin  20 mg Oral QHS   Chlorhexidine Gluconate Cloth  6 each Topical Q0600   insulin aspart  0-15 Units Subcutaneous TID WC   [START ON 05/24/2022] insulin aspart  10 Units Subcutaneous TID WC   insulin glargine-yfgn  20 Units Subcutaneous QHS   lactulose  40 g Oral TID   levothyroxine  100 mcg Oral Q0600   lidocaine  1 patch Transdermal Q24H   midodrine  5 mg Oral TID WC   pantoprazole  40 mg Oral Daily   rifaximin  550 mg Oral BID   terazosin  5 mg Oral QHS    Objective: Vital signs in last 24 hours: Temp:  [98 F (36.7 C)-98.5 F (36.9 C)] 98.2 F (36.8 C) (06/08 1623) Pulse Rate:  [62-70] 62 (06/08 1623) Resp:  [16-19] 19 (06/08 1623) BP: (106-107)/(54-74) 107/54 (06/08 1623) SpO2:  [94 %-99 %] 99 % (06/08 1623)    PHYSICAL EXAM:  General: Awake, alert,oriented X 4 Slwo response to questions But better today  Heart: S1-S2 point abdomen: Soft, non-tender,not distended. Bowel sounds normal. No masses Extremities: atraumatic, no cyanosis. No edema. No clubbing Skin: No rashes or  lesions. Or bruising Lymph: Cervical, supraclavicular normal. Neurologic: non ocal Lab Results    Latest Ref Rng & Units 05/23/2022    5:53 AM 05/22/2022    5:42 AM 05/21/2022    1:56 AM  CBC  WBC 4.0 - 10.5 K/uL 3.5  4.6  5.1   Hemoglobin 13.0 - 17.0 g/dL 9.8  10.1  10.8   Hematocrit 39.0 - 52.0 % 28.2  29.4  31.2   Platelets 150 - 400 K/uL 64  65  62        Latest Ref Rng & Units 05/23/2022    5:53 AM 05/22/2022    5:42 AM 05/21/2022    1:56 AM  BMP  Glucose 70 - 99 mg/dL 123  151  147   BUN 8 - 23 mg/dL 12  13  10    Creatinine 0.61 - 1.24 mg/dL 0.77  0.79  1.31   Sodium 135 - 145 mmol/L 138  135  135   Potassium 3.5 - 5.1 mmol/L 3.9  3.7  3.6   Chloride 98 - 111 mmol/L 107  104  105   CO2 22 - 32 mmol/L 27  26  29    Calcium 8.9 - 10.3 mg/dL 8.6  8.6  8.6      Liver Panel No results for input(s): "PROT", "ALBUMIN", "AST", "ALT", "ALKPHOS", "BILITOT", "BILIDIR", "  IBILI" in the last 72 hours.   Microbiology: Micro 05/14/22 4/4 Gram positive cocci- staphylococcus capri  05/16/2022 blood culture no growth  Assessment/Plan: 76 year old male with history of TAVR, cirrhosis, status post TIPS, diabetes mellitus, severe thrombocytopenia presenting with fall confusion and fever Sepsis Coag negative staph bacteremia secondary to [place]  patient has TAVR and hence concern for endocarditis and TEE done - no vegetations MRI of lumbar spine and cervical spine - no infection Currently on cefazolin Will need for 4 weeks minimum   MRI of the lumbar spine no evidence of discitis or osteomyelitis Repeat blood culture has been negative  Poor dentition.  Waiting extraction of all teeth as outpatient  Confusion from hepatic encephalopathy  AKI resolved  Diabetes mellitus on insulin  Cirrhosis of liver with decompensation.  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     OPAT Orders Diagnosis: Staphylococcus capri bacteremia TAVR Baseline Creatinine 1.31    Allergies  Allergen Reactions   Lisinopril Hives   Sulfamethoxazole Swelling   Camphor Rash and Swelling   Latex Rash   Nickel Rash  Discharge antibiotics: Cefazolin 2 grams Iv every 8 hours until 06/20/22  [facility] Care Per Protocol:  Labs weekly on Monday while on IV antibiotics: _X_ CBC with differential  _X_ CMP    _X_ Please pull PIC at completion of IV antibiotics   Fax weekly labs to [phone]  Clinic Follow Up Appt:   Call [phone] to make appt 06/13/22 at 10.15 am     Discussed the management with patient ID will sign off- call if needed

## 2022-05-23 NOTE — Progress Notes (Signed)
PROGRESS NOTE  Joshua Berry OHY:[id number] DOB: 1946-08-29   PCP: Lorelei Pont, MD  Patient is from: Home.  Lives with wife.  Reports using walker and cane at baseline.  DOA: 05/14/2022 LOS: 9  Chief complaints Chief Complaint  Patient presents with   Altered Mental Status     Brief Narrative / Interim history: 76 year old M with PMH of liver cirrhosis, portal HTN, GIB/GAVE, diastolic CHF, pancytopenia, OSA, TAVR, poor dentition and chronic right shoulder pain presenting with malaise and altered mental status and admitted to ICU with septic shock due to coagulase-negative staph bacteremia, and AKI requiring vasopressors and IV antibiotics.  BC ID with Staphylococcus species but not aureus or MRSA.  MRSA PCR screen negative.  ID consulted.    Patient was weaned off vasopressor and transferred to [facility] hospitalist service on 05/16/2022. Repeat blood culture NGTD.  TTE without significant finding or vegetation.  MRI lumbar spine with significant DDD but no discitis or osteomyelitis.  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  ID recommends TEE.  Cardiology consulted.   Subjective: Pt reported having walked today.   Objective: Vitals:   05/22/22 1928 05/23/22 0506 05/23/22 0732 05/23/22 1623  BP: (!) 114/53 106/61 107/74 (!) 107/54  Pulse: 71 68 70 62  Resp: 18 18 16 19   Temp: 98.9 F (37.2 C) 98 F (36.7 C) 98.5 F (36.9 C) 98.2 F (36.8 C)  TempSrc: Oral Oral Oral Oral  SpO2: 98% 94% 97% 99%  Weight:      Height:        Examination:  Constitutional: NAD, AAOx3, sitting in recliner HEENT: conjunctivae and lids normal, EOMI CV: No cyanosis.   RESP: normal respiratory effort, on RA Neuro: II - XII grossly intact.   Psych: flat mood and affect.     Procedures:  None  Microbiology summarized: 5/30-COVID-19 and influenza PCR nonreactive. 5/30-MRSA PCR screen negative. 5/30-blood culture Staphylococcus caprae  Assessment and Plan: Septic shock due to  Staphylococcus bacteremia Suspect odontogenic source given poor dentition.  Blood culture with Staphylococcus Caprae.  MRSA PCR screen negative.  Sepsis physiology resolved.  Repeat blood cultures NGTD.  MRI lumbar spine with DDD but no suspicion for discitis osteomyelitis. --TEE neg -Infectious disease following. -Vancomycin 5/30>> IV Ancef 6/2>>> -CTX and Zithromax 5/30-5/31. Plan: --cont Ancef until 06/20/22 --place PICC line tomorrow  AKI (acute kidney injury) ([place]) on presentation and again after re-starting of home lasix  Plan: --hold lasix  Cirrhosis of liver  Looks compensated.  Hx TIPS. Mild LFT elevation with mild hyperbilirubinemia likely from sepsis. One bm last 24. Not encephalopathic -Continue rifaximin --cont lactulose  Fall at home, initial encounter Reportedly slipped and fell at home.  No notable injury.  CT head and cervical spine and MRI lumbar spine without acute finding. -Fall precaution --SNF rehab  Hypotension Resolved.   --cont midodrine 5 mg BID  Chronic diastolic CHF (congestive heart failure) ([place]) TTE in 2020 with LVEF of 55 to 65%.  Seems to be on p.o. Lasix 20 mg daily at home.  Appears euvolemic on exam.  TTE without significant finding.  Controlled IDDM-2 with hyperglycemia A1c 6.1% about 1 month ago.   --cont glargine 20u nightly --increase mealtime insulin to 10u TID --SSI TID  Hypokalemia and hypomagnesemia Monitor and replenish as appropriate  Acute hepatic encephalopathy (HCC) Resolved --cont lactulose  Back pain Lumbar MRI with DDD but no concern for discitis or osteomyelitis.  No fracture.  Lactic acidosis Resolved.  Obesity (BMI 30-39.9) Body mass index  is 30.57 kg/m.  Physical deconditioning PT/OT  Hypothyroidism Continue home Synthroid.  Right shoulder pain Chronic.  Unchanged per patient.  Seems to have improved. -Tylenol as needed  Pancytopenia ([place]) Likely due to liver cirrhosis.  Leukopenia resolved.   Thrombocytopenia improving.  H&H stable. -Continue monitoring  Portal hypertension ([place]) S/p tips. Doesn't appear volume overloaded    DVT prophylaxis:  SCDs Start: 05/14/22 1608  Code Status: Full code Family Communication:  Level of care: Med-Surg Status is: Inpatient  Final disposition: SNF  Sch Meds:  Scheduled Meds:  aspirin EC  81 mg Oral Daily   atorvastatin  20 mg Oral QHS   Chlorhexidine Gluconate Cloth  6 each Topical Q0600   insulin aspart  0-15 Units Subcutaneous TID WC   insulin aspart  8 Units Subcutaneous TID WC   insulin glargine-yfgn  20 Units Subcutaneous QHS   lactulose  40 g Oral TID   levothyroxine  100 mcg Oral Q0600   lidocaine  1 patch Transdermal Q24H   midodrine  5 mg Oral TID WC   pantoprazole  40 mg Oral Daily   rifaximin  550 mg Oral BID   terazosin  5 mg Oral QHS   Continuous Infusions:  sodium chloride Stopped (05/21/22 1413)    ceFAZolin (ANCEF) IV 2 g (05/23/22 1317)   PRN Meds:.sodium chloride, acetaminophen, ketotifen, polyvinyl alcohol  Antimicrobials: Anti-infectives (From admission, onward)    Start     Dose/Rate Route Frequency Ordered Stop   05/17/22 2200  ceFAZolin (ANCEF) IVPB 2g/100 mL premix        2 g 200 mL/hr over 30 Minutes Intravenous Every 8 hours 05/17/22 1424     05/15/22 1400  rifaximin (XIFAXAN) tablet 550 mg        550 mg Oral 2 times daily 05/15/22 1229     05/15/22 1000  vancomycin (VANCOREADY) IVPB 2000 mg/400 mL  Status:  Discontinued        2,000 mg 200 mL/hr over 120 Minutes Intravenous Every 24 hours 05/15/22 0306 05/17/22 1424   05/14/22 2000  cefTRIAXone (ROCEPHIN) 2 g in sodium chloride 0.9 % 100 mL IVPB  Status:  Discontinued        2 g 200 mL/hr over 30 Minutes Intravenous Every 24 hours 05/14/22 1617 05/15/22 0257   05/14/22 1800  azithromycin (ZITHROMAX) 500 mg in sodium chloride 0.9 % 250 mL IVPB  Status:  Discontinued        500 mg 250 mL/hr over 60 Minutes Intravenous Every 24 hours  05/14/22 1617 05/15/22 1512   05/14/22 1330  vancomycin (VANCOREADY) IVPB 1250 mg/250 mL        1,250 mg 166.7 mL/hr over 90 Minutes Intravenous  Once 05/14/22 1220 05/14/22 1435   05/14/22 1215  ceFEPIme (MAXIPIME) 2 g in sodium chloride 0.9 % 100 mL IVPB        2 g 200 mL/hr over 30 Minutes Intravenous  Once 05/14/22 1202 05/14/22 1300   05/14/22 1215  vancomycin (VANCOCIN) IVPB 1000 mg/200 mL premix        1,000 mg 200 mL/hr over 60 Minutes Intravenous  Once 05/14/22 1202 05/14/22 1331        I have personally reviewed the following labs and images: CBC: Recent Labs  Lab 05/17/22 0550 05/19/22 0348 05/21/22 0156 05/22/22 0542 05/23/22 0553  WBC 4.7 4.4 5.1 4.6 3.5*  NEUTROABS  --  2.8  --   --   --   HGB 10.7* 11.8* 10.8* 10.1*  9.8*  HCT 30.1* 33.2* 31.2* 29.4* 28.2*  MCV 91.8 93.5 92.9 92.7 92.5  PLT 40* 76* 62* 65* 64*   BMP &GFR Recent Labs  Lab 05/17/22 0550 05/18/22 0544 05/20/22 0732 05/21/22 0156 05/22/22 0542 05/23/22 0553  NA 135 134* 134* 135 135 138  K 3.9 3.4* 3.9 3.6 3.7 3.9  CL 104 102 103 105 104 107  CO2 27 26 23 29 26 27   GLUCOSE 155* 145* 158* 147* 151* 123*  BUN 11 10 8 10 13 12   CREATININE 0.80 0.74 0.76 1.31* 0.79 0.77  CALCIUM 8.1* 8.3* 8.6* 8.6* 8.6* 8.6*  MG 1.6* 1.6* 1.5* 2.0 1.7 1.6*  PHOS 2.6 3.1 3.2  --   --   --    Estimated Creatinine Clearance: 98.2 mL/min (by C-G formula based on SCr of 0.77 mg/dL). Liver & Pancreas: Recent Labs  Lab 05/17/22 0550 05/18/22 0544 05/19/22 0348 05/20/22 0732  AST 78* 87* 123* 80*  ALT 51* 62* 83* 53*  ALKPHOS 72 84 106 99  BILITOT 2.2* 2.0* 1.9* 1.7*  PROT 6.0* 6.0* 6.4* 6.1*  ALBUMIN 2.8* 2.7* 2.8* 2.7*   No results for input(s): "LIPASE", "AMYLASE" in the last 168 hours. Recent Labs  Lab 05/17/22 0550 05/18/22 0544 05/19/22 0348 05/20/22 0732  AMMONIA 53* 71* 26 42*   Diabetic: No results for input(s): "HGBA1C" in the last 72 hours. Recent Labs  Lab 05/22/22 1646  05/22/22 2100 05/23/22 0817 05/23/22 1151 05/23/22 1639  GLUCAP 154* 180* 129* 287* 122*   Cardiac Enzymes: No results for input(s): "CKTOTAL", "CKMB", "CKMBINDEX", "TROPONINI" in the last 168 hours. No results for input(s): "PROBNP" in the last 8760 hours. Coagulation Profile: Recent Labs  Lab 05/17/22 0550 05/18/22 0544  INR 1.3* 1.3*   Thyroid Function Tests: No results for input(s): "TSH", "T4TOTAL", "FREET4", "T3FREE", "THYROIDAB" in the last 72 hours.  Lipid Profile: No results for input(s): "CHOL", "HDL", "LDLCALC", "TRIG", "CHOLHDL", "LDLDIRECT" in the last 72 hours. Anemia Panel: No results for input(s): "VITAMINB12", "FOLATE", "FERRITIN", "TIBC", "IRON", "RETICCTPCT" in the last 72 hours. Urine analysis:    Component Value Date/Time   COLORURINE YELLOW (A) 05/14/2022 1104   APPEARANCEUR CLEAR (A) 05/14/2022 1104   APPEARANCEUR Haz[place] 05/01/2014 1653   LABSPEC 1.021 05/14/2022 1104   LABSPEC 1.015 05/01/2014 1653   PHURINE 7.0 05/14/2022 1104   GLUCOSEU NEGATIVE 05/14/2022 1104   GLUCOSEU Negative 05/01/2014 [place] 05/14/2022 [place] 05/14/2022 1104   BILIRUBINUR Negative 05/01/2014 [place] 05/14/2022 [place] 05/14/2022 1104   NITRITE NEGATIVE 05/14/2022 [place] 05/14/2022 1104   LEUKOCYTESUR Negative 05/01/2014 1653   Sepsis Labs: Invalid input(s): "PROCALCITONIN", "LACTICIDVEN"  Microbiology: Recent Results (from the past 240 hour(s))  Blood Culture (routine x 2)     Status: Abnormal   Collection Time: 05/14/22 11:06 AM   Specimen: BLOOD  Result Value Ref Range Status   Specimen Description   Final    BLOOD BLOOD RIGHT FOREARM Performed at [facility], [address], [place], [place] [address]    Special Requests   Final    BOTTLES DRAWN AEROBIC AND ANAEROBIC Blood Culture adequate volume Performed at [facility], [address]  [place], [place] [address]    Culture  Setup Time   Final    Organism ID to follow GRAM POSITIVE COCCI IN BOTH AEROBIC AND ANAEROBIC BOTTLES CRITICAL RESULT CALLED TO, READ BACK BY  AND VERIFIED WITH: NATHAN BELUTH @ 1610 ON 05/15/2022.Marland KitchenMarland KitchenTKR    Culture STAPHYLOCOCCUS CAPRAE (A)  Final   Report Status 05/17/2022 FINAL  Final   Organism ID, Bacteria STAPHYLOCOCCUS CAPRAE  Final      Susceptibility   Staphylococcus caprae - MIC*    CIPROFLOXACIN 1 SENSITIVE Sensitive     ERYTHROMYCIN <=0.25 SENSITIVE Sensitive     GENTAMICIN <=0.5 SENSITIVE Sensitive     OXACILLIN <=0.25 SENSITIVE Sensitive     TETRACYCLINE <=1 SENSITIVE Sensitive     VANCOMYCIN <=0.5 SENSITIVE Sensitive     TRIMETH/SULFA <=10 SENSITIVE Sensitive     CLINDAMYCIN <=0.25 SENSITIVE Sensitive     RIFAMPIN >=32 RESISTANT Resistant     Inducible Clindamycin NEGATIVE Sensitive     * STAPHYLOCOCCUS CAPRAE  Blood Culture ID Panel (Reflexed)     Status: Abnormal   Collection Time: 05/14/22 11:06 AM  Result Value Ref Range Status   Enterococcus faecalis NOT DETECTED NOT DETECTED Final   Enterococcus Faecium NOT DETECTED NOT DETECTED Final   Listeria monocytogenes NOT DETECTED NOT DETECTED Final   Staphylococcus species DETECTED (A) NOT DETECTED Final    Comment: NATHAN BELUTH @ 9604 ON 05/15/2022.Marland KitchenMarland KitchenTKR   Staphylococcus aureus (BCID) NOT DETECTED NOT DETECTED Final   Staphylococcus epidermidis NOT DETECTED NOT DETECTED Final   Staphylococcus lugdunensis NOT DETECTED NOT DETECTED Final   Streptococcus species NOT DETECTED NOT DETECTED Final   Streptococcus agalactiae NOT DETECTED NOT DETECTED Final   Streptococcus pneumoniae NOT DETECTED NOT DETECTED Final   Streptococcus pyogenes NOT DETECTED NOT DETECTED Final   A.calcoaceticus-baumannii NOT DETECTED NOT DETECTED Final   Bacteroides fragilis NOT DETECTED NOT DETECTED Final   Enterobacterales NOT DETECTED NOT DETECTED Final   Enterobacter cloacae complex NOT DETECTED NOT DETECTED  Final   Escherichia coli NOT DETECTED NOT DETECTED Final   Klebsiella aerogenes NOT DETECTED NOT DETECTED Final   Klebsiella oxytoca NOT DETECTED NOT DETECTED Final   Klebsiella pneumoniae NOT DETECTED NOT DETECTED Final   Proteus species NOT DETECTED NOT DETECTED Final   Salmonella species NOT DETECTED NOT DETECTED Final   Serratia marcescens NOT DETECTED NOT DETECTED Final   Haemophilus influenzae NOT DETECTED NOT DETECTED Final   Neisseria meningitidis NOT DETECTED NOT DETECTED Final   Pseudomonas aeruginosa NOT DETECTED NOT DETECTED Final   Stenotrophomonas maltophilia NOT DETECTED NOT DETECTED Final   Candida albicans NOT DETECTED NOT DETECTED Final   Candida auris NOT DETECTED NOT DETECTED Final   Candida glabrata NOT DETECTED NOT DETECTED Final   Candida krusei NOT DETECTED NOT DETECTED Final   Candida parapsilosis NOT DETECTED NOT DETECTED Final   Candida tropicalis NOT DETECTED NOT DETECTED Final   Cryptococcus neoformans/gattii NOT DETECTED NOT DETECTED Final    Comment: Performed at [facility], [address] [place], [place] [address]  Blood Culture (routine x 2)     Status: Abnormal   Collection Time: 05/14/22 11:22 AM   Specimen: BLOOD  Result Value Ref Range Status   Specimen Description   Final    BLOOD RIGHT ARM Performed at [facility], [address] [place], [place] [address]    Special Requests   Final    BOTTLES DRAWN AEROBIC AND ANAEROBIC Blood Culture results may not be optimal due to an excessive volume of blood received in culture bottles Performed at [facility], [address], [place], [place] [address]    Culture  Setup Time   Final    GRAM POSITIVE COCCI IN BOTH  AEROBIC AND ANAEROBIC BOTTLES CRITICAL VALUE NOTED.  VALUE IS CONSISTENT WITH PREVIOUSLY REPORTED AND CALLED VALUE.    Culture (A)  Final    STAPHYLOCOCCUS CAPRAE SUSCEPTIBILITIES PERFORMED ON PREVIOUS CULTURE WITHIN THE LAST 5 DAYS. Performed at [facility], [place] [address], [place], [place] [address]    Report Status 05/17/2022 FINAL  Final  Resp Panel by RT-PCR (Flu A&B, Covid) Anterior Nasal Swab     Status: None   Collection Time: 05/14/22 12:04 PM   Specimen: Anterior Nasal Swab  Result Value Ref Range Status   SARS Coronavirus 2 by RT PCR NEGATIVE NEGATIVE Final    Comment: (NOTE) SARS-CoV-2 target nucleic acids are NOT DETECTED.  The SARS-CoV-2 RNA is generally detectable in upper respiratory specimens during the acute phase of infection. The lowest concentration of SARS-CoV-2 viral copies this assay can detect is 138 copies/mL. A negative result does not preclude SARS-Cov-2 infection and should not be used as the sole basis for treatment or other patient management decisions. A negative result may occur with  improper specimen collection/handling, submission of specimen other than nasopharyngeal swab, presence of viral mutation(s) within the areas targeted by this assay, and inadequate number of viral copies(<138 copies/mL). A negative result must be combined with clinical observations, patient history, and epidemiological information. The expected result is Negative.  Fact Sheet for Patients:  EntrepreneurPulse.com.au  Fact Sheet for Healthcare Providers:  IncredibleEmployment.be  This test is no t yet approved or cleared by the [place] FDA and  has been authorized for detection and/or diagnosis of SARS-CoV-2 by FDA under an Emergency Use Authorization (EUA). This EUA will remain  in effect (meaning this test can be used) for the duration of the COVID-19 declaration under Section 564(b)(1) of the Act, 21 U.S.C.section 360bbb-3(b)(1), unless the authorization is terminated  or revoked sooner.       Influenza A by PCR NEGATIVE NEGATIVE Final   Influenza B by PCR NEGATIVE NEGATIVE Final    Comment: (NOTE) The Xpert Xpress SARS-CoV-2/FLU/RSV plus assay is intended as an  aid in the diagnosis of influenza from Nasopharyngeal swab specimens and should not be used as a sole basis for treatment. Nasal washings and aspirates are unacceptable for Xpert Xpress SARS-CoV-2/FLU/RSV testing.  Fact Sheet for Patients: EntrepreneurPulse.com.au  Fact Sheet for Healthcare Providers: IncredibleEmployment.be  This test is not yet approved or cleared by the [place] FDA and has been authorized for detection and/or diagnosis of SARS-CoV-2 by FDA under an Emergency Use Authorization (EUA). This EUA will remain in effect (meaning this test can be used) for the duration of the COVID-19 declaration under Section 564(b)(1) of the Act, 21 U.S.C. section 360bbb-3(b)(1), unless the authorization is terminated or revoked.  Performed at [facility], [address] [place], [place] [address]   MRSA Next Gen by PCR, Nasal     Status: None   Collection Time: 05/14/22  9:29 PM   Specimen: Nasal Mucosa; Nasal Swab  Result Value Ref Range Status   MRSA by PCR Next Gen NOT DETECTED NOT DETECTED Final    Comment: (NOTE) The GeneXpert MRSA Assay (FDA approved for NASAL specimens only), is one component of a comprehensive MRSA colonization surveillance program. It is not intended to diagnose MRSA infection nor to guide or monitor treatment for MRSA infections. Test performance is not FDA approved in patients less than 27 years old. Performed at [facility], [address], [place], [place] [address]   Culture, blood (Routine  X 2) w Reflex to ID Panel     Status: None   Collection Time: 05/16/22  3:30 PM   Specimen: BLOOD  Result Value Ref Range Status   Specimen Description BLOOD LAC  Final   Special Requests BOTTLES DRAWN AEROBIC AND ANAEROBIC BCAV  Final   Culture   Final    NO GROWTH 5 DAYS Performed at [facility], [address], [place], [place] [address]    Report Status 05/21/2022 FINAL  Final   Culture, blood (Routine X 2) w Reflex to ID Panel     Status: None   Collection Time: 05/16/22  3:35 PM   Specimen: BLOOD  Result Value Ref Range Status   Specimen Description BLOOD RAC  Final   Special Requests BOTTLES DRAWN AEROBIC AND ANAEROBIC BCAV  Final   Culture   Final    NO GROWTH 5 DAYS Performed at [facility], [address] [place], [place] [address]    Report Status 05/21/2022 FINAL  Final    Radiology Studies: No results found.   If 7PM-7AM, please contact night-coverage [email or web address] 05/23/2022, 6:57 PM

## 2022-05-23 NOTE — TOC Progression Note (Addendum)
Transition of Care [facility]) - Progression Note    Patient Details  Name: Joshua Berry MRN: [id number] Date of Birth: Jun 06, 1946  Transition of Care [facility]) CM/SW [place], LCSW Phone Number: 05/23/2022, 9:41 AM  Clinical Narrative:  Damaris Schooner to [place] with the [facility]. They will review auth request at 1:00 and then follow up.   12:44 pm: Patient is approved for a 32-day [facility] SNF contract but they have different facilities they are contracted with than [place] is [facility] in [place], next closest is [place], [place]. Others are towards [place]. Equities trader at [employer] to see if they could do a Advertising copywriter with one of the [employer]. Wife has been updated.  1:11 pm: [facility] declined. Left voicemail for Wells Guiles at the [facility] regarding one-time contract with a closer facility.  3:44 pm: Dorthula Rue will allow placement at [facility] SNF as long as [facility] approves it. Referral did not go through to [facility] yesterday. Resent.  4:36 pm: Updated patient.  Expected Discharge Plan: [place] Barriers to Discharge: Continued Medical Work up  Expected Discharge Plan and Services Expected Discharge Plan: [place] arrangements for the past 2 months: [place] Agency: [place] Date [place]: 05/17/22   Representative spoke with at [place]: [place] ([name])   Social Determinants of Health ([place]) Interventions    Readmission Risk Interventions    05/17/2022    1:45 PM  Readmission Risk Prevention Plan  Transportation Screening Complete  PCP or Specialist Appt within 5-7 Days Complete  Home Care Screening Complete  Medication Review (RN CM) Complete

## 2022-05-23 NOTE — Progress Notes (Signed)
Mobility Specialist - Progress Note    05/23/22 1100  Mobility  Activity Ambulated with assistance in room;Stood at bedside;Transferred to/from [facility]  Level of Assistance Standby assist, set-up cues, supervision of patient - no hands on  Assistive Device Front wheel walker  Activity Response Tolerated well  $Mobility charge 1 Mobility    Pt attempting to transfer from C-BSC using RA upon arrival but assists with supervision for safety. Pt has BM and transfers BSC-C SUPERVISION - no hands on or set-up cues. Completes STS from recliner supervision and ambulates 53f in room supervision with no hands on. Descends to chair ModI and is left in recliner with needs in reach and alarm set.  MMerrily BrittleMobility Specialist 05/23/22, 11:11 AM

## 2022-05-23 NOTE — Progress Notes (Signed)
Physical Therapy Treatment Patient Details Name: Joshua Berry MRN: [id number] DOB: October 20, 1946 Today's Date: 05/23/2022   History of Present Illness Pt is a 76 y/o M who presented with c/o malaise & AMS. Vital signs showed shock physiology. PMH: DM, Liver cirrhosis, dCHF, essential HTN, dyslipidemia, arthritis, heart murmur, NASH, sleep apnea    PT Comments    Pt received seated in recliner upon arrival to room and pt agreeable to therapy.  Pt performed well with transfer and ambulated well with the RW.  Pt able to make good progress with distance but continues to self-limit himself with fatigue rather than strength.  As noted below, pt has narrow gait and pronated R ankle/foot.  Current discharge plans to SNF remain appropriate at this time.  Pt will continue to benefit from skilled therapy in order to address deficits listed below.    Recommendations for follow up therapy are one component of a multi-disciplinary discharge planning process, led by the attending physician.  Recommendations may be updated based on patient status, additional functional criteria and insurance authorization.  Follow Up Recommendations  Skilled nursing-short term rehab (<3 hours/day)     Assistance Recommended at Discharge Frequent or constant Supervision/Assistance  Patient can return home with the following A little help with walking and/or transfers;A lot of help with bathing/dressing/bathroom;Assistance with cooking/housework;Direct supervision/assist for medications management;Help with stairs or ramp for entrance   Equipment Recommendations  Rolling walker (2 wheels)    Recommendations for Other Services       Precautions / Restrictions Precautions Precautions: Fall Restrictions Weight Bearing Restrictions: No     Mobility  Bed Mobility               General bed mobility comments: pt in recliner and returned to recliner    Transfers Overall transfer level: Needs assistance Equipment  used: Rolling walker (2 wheels) Transfers: Sit to/from Stand Sit to Stand: Min assist           General transfer comment: min assist to stand from recliner surface    Ambulation/Gait Ambulation/Gait assistance: Min guard Gait Distance (Feet): 60 Feet Assistive device: Rolling walker (2 wheels) Gait Pattern/deviations: Trunk flexed, Step-through pattern Gait velocity: decreased     General Gait Details: pt still fatigues quickly and requests to go back.  pt has significant pronation of the R ankle/foot and also has narrow BOS when ambulating.   Stairs             Wheelchair Mobility    Modified Rankin (Stroke Patients Only)       Balance Overall balance assessment: Needs assistance Sitting-balance support: Feet supported, No upper extremity supported Sitting balance-Leahy Scale: Good     Standing balance support: Bilateral upper extremity supported Standing balance-Leahy Scale: Fair                              Cognition Arousal/Alertness: Awake/alert Behavior During Therapy: WFL for tasks assessed/performed Overall Cognitive Status: Within Functional Limits for tasks assessed                                 General Comments: pt is A and O x 3        Exercises      General Comments        Pertinent Vitals/Pain Pain Assessment Pain Assessment: No/denies pain    Home Living  Prior Function            PT Goals (current goals can now be found in the care plan section) Acute Rehab PT Goals Patient Stated Goal: " get stronger and go home." PT Goal Formulation: With patient Time For Goal Achievement: 05/30/22 Potential to Achieve Goals: Fair Progress towards PT goals: Progressing toward goals    Frequency    Min 2X/week      PT Plan Current plan remains appropriate    Co-evaluation              AM-PAC PT "6 Clicks" Mobility   Outcome Measure  Help needed turning  from your back to your side while in a flat bed without using bedrails?: A Little Help needed moving from lying on your back to sitting on the side of a flat bed without using bedrails?: A Little Help needed moving to and from a bed to a chair (including a wheelchair)?: A Little Help needed standing up from a chair using your arms (e.g., wheelchair or bedside chair)?: A Little Help needed to walk in hospital room?: A Little Help needed climbing 3-5 steps with a railing? : A Lot 6 Click Score: 17    End of Session Equipment Utilized During Treatment: Gait belt Activity Tolerance: Patient tolerated treatment well Patient left: in chair;with call bell/phone within reach;with chair alarm set Nurse Communication: Mobility status PT Visit Diagnosis: Unsteadiness on feet (R26.81);Muscle weakness (generalized) (M62.81);Difficulty in walking, not elsewhere classified (R26.2)     Time: 8921-1941 PT Time Calculation (min) (ACUTE ONLY): 12 min  Charges:  $Gait Training: 8-22 mins                     [name], PT, DPT 05/23/22, 2:03 PM    [name] 05/23/2022, 1:58 PM

## 2022-05-24 ENCOUNTER — Encounter: Payer: Self-pay | Admitting: Obstetrics and Gynecology

## 2022-05-24 DIAGNOSIS — A412 Sepsis due to unspecified staphylococcus: Secondary | ICD-10-CM | POA: Diagnosis not present

## 2022-05-24 DIAGNOSIS — R6521 Severe sepsis with septic shock: Secondary | ICD-10-CM | POA: Diagnosis not present

## 2022-05-24 LAB — MAGNESIUM: Magnesium: 1.6 mg/dL — ABNORMAL LOW (ref 1.7–2.4)

## 2022-05-24 LAB — GLUCOSE, CAPILLARY
Glucose-Capillary: 138 mg/dL — ABNORMAL HIGH (ref 70–99)
Glucose-Capillary: 121 mg/dL — ABNORMAL HIGH (ref 70–99)
Glucose-Capillary: 201 mg/dL — ABNORMAL HIGH (ref 70–99)
Glucose-Capillary: 89 mg/dL (ref 70–99)

## 2022-05-24 MED ORDER — SODIUM CHLORIDE 0.9% FLUSH: 10.0000 mL | INTRAVENOUS | Status: DC | PRN

## 2022-05-24 MED ORDER — METHOCARBAMOL 500 MG PO TABS
500.0000 mg | ORAL_TABLET | Freq: Once | ORAL | Status: AC
  Administered 2022-05-24: 500 mg via ORAL
  Filled 2022-05-24: qty 1

## 2022-05-24 MED ORDER — SODIUM CHLORIDE 0.9% FLUSH
10.0000 mL | Freq: Two times a day (BID) | INTRAVENOUS | Status: DC
  Administered 2022-05-24 – 2022-05-28 (×9): 10 mL

## 2022-05-24 MED ORDER — TROLAMINE SALICYLATE 10 % EX CREA
TOPICAL_CREAM | CUTANEOUS | Status: DC | PRN
  Administered 2022-05-25: 1 via TOPICAL
  Filled 2022-05-24: qty 85

## 2022-05-24 MED ORDER — MAGNESIUM SULFATE 2 GM/50ML IV SOLN
2.0000 g | Freq: Once | INTRAVENOUS | Status: AC
  Administered 2022-05-24: 2 g via INTRAVENOUS
  Filled 2022-05-24: qty 50

## 2022-05-24 MED ORDER — INSULIN ASPART 100 UNIT/ML IJ SOLN
12.0000 [IU] | Freq: Three times a day (TID) | INTRAMUSCULAR | Status: DC
  Administered 2022-05-25 – 2022-05-26 (×6): 12 [IU] via SUBCUTANEOUS
  Filled 2022-05-24 (×6): qty 1

## 2022-05-24 NOTE — TOC Progression Note (Addendum)
Transition of Care [facility]) - Progression Note    Patient Details  Name: Joshua Berry MRN: [id number] Date of Birth: 1946-08-20  Transition of Care [facility]) CM/SW [place], LCSW Phone Number: 05/24/2022, 11:57 AM  Clinical Narrative:  Albin Fischer did not receive referral. Sent to admissions coordinator in secure email.   3:13 pm: Left voicemail for admissions coordinator to see if she had reviewed referral. Wife called earlier. Provided update. [place] does not have any [facility] beds but said their [place] facility has a Geographical information systems officer. Sent referral and asked admissions coordinator to review.  3:39 pm: Wife said [facility] is too far. If [facility] is unable to accept him, plan is to bring him home with home health.  Expected Discharge Plan: [place] Barriers to Discharge: Continued Medical Work up  Expected Discharge Plan and Services Expected Discharge Plan: [place] arrangements for the past 2 months: [place] Agency: [place] Date [place]: 05/17/22   Representative spoke with at [place]: [place] ([facility])   Social Determinants of Health ([place]) Interventions    Readmission Risk Interventions    05/17/2022    1:45 PM  Readmission Risk Prevention Plan  Transportation Screening Complete  PCP or Specialist Appt within 5-7 Days Complete  Home Care Screening Complete  Medication Review (RN CM) Complete

## 2022-05-24 NOTE — Progress Notes (Signed)
Mobility Specialist - Progress Note   05/24/22 1500  Mobility  Activity Refused mobility     Pt declined mobility, reports he just finished ambulating hallway. Resting in recliner at this time.  Will attempt another date/time.  Kathee Delton Mobility Specialist 05/24/22, 3:43 PM

## 2022-05-24 NOTE — Progress Notes (Signed)
Occupational Therapy Treatment Patient Details Name: Joshua Berry MRN: [id number] DOB: 22-Sep-1946 Today's Date: 05/24/2022   History of present illness Pt is a 76 y/o M who presented with c/o malaise & AMS. Vital signs showed shock physiology. PMH: DM, Liver cirrhosis, dCHF, essential HTN, dyslipidemia, arthritis, heart murmur, NASH, sleep apnea   OT comments  Pt. Was sitting up in the recliner upon arrival.Pt. and daughter report pt. Has a new PICC line placed.  Pt. Education was provided about A/E use for LE ADLs.  Pt. reports having 4 reachers at home. Pt. Reports having personal care aides from the [place] assist with ADLs at home. Pt. Continues to benefit from OT services for ADL training, A/E training, and pt. Education about home modification, and DME.  Pt. would benefit from SNF level of care upon discharge, with follow-up OT services.    Recommendations for follow up therapy are one component of a multi-disciplinary discharge planning process, led by the attending physician.  Recommendations may be updated based on patient status, additional functional criteria and insurance authorization.    Follow Up Recommendations  Skilled nursing-short term rehab (<3 hours/day)    Assistance Recommended at Discharge Frequent or constant Supervision/Assistance  Patient can return home with the following  A little help with walking and/or transfers;A little help with bathing/dressing/bathroom;Help with stairs or ramp for entrance   Equipment Recommendations  BSC/3in1    Recommendations for Other Services      Precautions / Restrictions Precautions Precautions: Fall Restrictions Weight Bearing Restrictions: No       Mobility Bed Mobility Overal bed mobility: Needs Assistance       Supine to sit: Min assist Sit to supine: Min assist        Transfers Overall transfer level: Needs assistance Equipment used: Rolling walker (2 wheels) Transfers: Sit to/from Stand Sit to Stand: Min  assist           General transfer comment: Mobility: per chart     Balance                                           ADL either performed or assessed with clinical judgement   ADL Overall ADL's : Needs assistance/impaired                                       General ADL Comments: Pt. edcuation was provided about A/E use for LE ADLs.    Extremity/Trunk Assessment Upper Extremity Assessment Upper Extremity Assessment: Generalized weakness            Vision Baseline Vision/History: 0 No visual deficits     Perception     Praxis      Cognition Arousal/Alertness: Awake/alert Behavior During Therapy: WFL for tasks assessed/performed Overall Cognitive Status: Within Functional Limits for tasks assessed                                          Exercises      Shoulder Instructions       General Comments      Pertinent Vitals/ Pain       Pain Assessment Pain Assessment: No/denies pain  Home Living  Prior Functioning/Environment              Frequency  Min 2X/week        Progress Toward Goals  OT Goals(current goals can now be found in the care plan section)  Progress towards OT goals: Progressing toward goals  Acute Rehab OT Goals OT Goal Formulation: With patient Time For Goal Achievement: 05/30/22 Potential to Achieve Goals: Good  Plan Discharge plan remains appropriate;Frequency remains appropriate    Co-evaluation          OT goals addressed during session: ADL's and self-care      AM-PAC OT "6 Clicks" Daily Activity     Outcome Measure   Help from another person eating meals?: None Help from another person taking care of personal grooming?: A Little Help from another person toileting, which includes using toliet, bedpan, or urinal?: A Lot Help from another person bathing (including washing, rinsing, drying)?: A  Lot Help from another person to put on and taking off regular upper body clothing?: A Little Help from another person to put on and taking off regular lower body clothing?: A Lot 6 Click Score: 16    End of Session Equipment Utilized During Treatment: Rolling walker (2 wheels)  OT Visit Diagnosis: Other abnormalities of gait and mobility (R26.89);Muscle weakness (generalized) (M62.81)   Activity Tolerance Patient tolerated treatment well   Patient Left in chair;with call bell/phone within reach;with chair alarm set   Nurse Communication Mobility status        Time: 9024-0973 OT Time Calculation (min): 15 min  Charges: OT General Charges $OT Visit: 1 Visit OT Treatments $Self Care/Home Management : 8-22 mins  Harrel Carina, MS, OTR/L   Harrel Carina 05/24/2022, 2:33 PM

## 2022-05-24 NOTE — Progress Notes (Signed)
PROGRESS NOTE  Joshua Berry DJM:[id number] DOB: Feb 25, 1946   PCP: Lorelei Pont, MD  Patient is from: Home.  Lives with wife.  Reports using walker and cane at baseline.  DOA: 05/14/2022 LOS: 23  Chief complaints Chief Complaint  Patient presents with   Altered Mental Status     Brief Narrative / Interim history: 76 year old M with PMH of liver cirrhosis, portal HTN, GIB/GAVE, diastolic CHF, pancytopenia, OSA, TAVR, poor dentition and chronic right shoulder pain presenting with malaise and altered mental status and admitted to ICU with septic shock due to coagulase-negative staph bacteremia, and AKI requiring vasopressors and IV antibiotics.  BC ID with Staphylococcus species but not aureus or MRSA.  MRSA PCR screen negative.  ID consulted.    Patient was weaned off vasopressor and transferred to [facility] hospitalist service on 05/16/2022. Repeat blood culture NGTD.  TTE without significant finding or vegetation.  MRI lumbar spine with significant DDD but no discitis or osteomyelitis.  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  ID recommends TEE.  Cardiology consulted.   Subjective: No new issues today.  Reported normal oral intake and urination.  PICC line placed today.   Objective: Vitals:   05/23/22 2231 05/24/22 0409 05/24/22 0746 05/24/22 1546  BP: 135/61 117/65 120/63 (!) 101/54  Pulse: (!) 57 88 65 64  Resp: 12 14 18 18   Temp: 98.3 F (36.8 C) 99.1 F (37.3 C) 98.4 F (36.9 C) 98.1 F (36.7 C)  TempSrc: Oral Oral Oral Oral  SpO2: 98% 97% 97% 97%  Weight:      Height:        Examination:  Constitutional: NAD, AAOx3, sitting in recliner HEENT: conjunctivae and lids normal, EOMI CV: No cyanosis.   RESP: normal respiratory effort, on RA Extremities: No effusions, edema in BLE SKIN: warm, dry Neuro: II - XII grossly intact.   Psych: Normal mood and affect.     Procedures:  None  Microbiology summarized: 5/30-COVID-19 and influenza PCR  nonreactive. 5/30-MRSA PCR screen negative. 5/30-blood culture Staphylococcus caprae  Assessment and Plan: Septic shock due to Staphylococcus bacteremia Suspect odontogenic source given poor dentition.  Blood culture with Staphylococcus Caprae.  MRSA PCR screen negative.  Sepsis physiology resolved.  Repeat blood cultures NGTD.  MRI lumbar spine with DDD but no suspicion for discitis osteomyelitis. --TEE neg -Infectious disease following. -Vancomycin 5/30>> IV Ancef 6/2>>> -CTX and Zithromax 5/30-5/31. Plan: --cont Ancef until 06/20/22 --place PICC line today  AKI (acute kidney injury) ([place]) on presentation and again after re-starting of home lasix  Plan: --hold lasix  Cirrhosis of liver  Looks compensated.  Hx TIPS. Mild LFT elevation with mild hyperbilirubinemia likely from sepsis. One bm last 24. Not encephalopathic -Continue rifaximin --cont lactulose  Fall at home, initial encounter Reportedly slipped and fell at home.  No notable injury.  CT head and cervical spine and MRI lumbar spine without acute finding. -Fall precaution --SNF rehab  Hypotension Resolved.   --cont midodrine 5 mg BID  Chronic diastolic CHF (congestive heart failure) ([place]) TTE in 2020 with LVEF of 55 to 65%.  Seems to be on p.o. Lasix 20 mg daily at home.  Appears euvolemic on exam.  TTE without significant finding.  Controlled IDDM-2 with hyperglycemia A1c 6.1% about 1 month ago.   --cont glargine 20u nightly --increase mealtime insulin to 12u TID --SSI TID  Hypokalemia and hypomagnesemia Monitor and replenish as appropriate  Acute hepatic encephalopathy (H[facility]) Resolved --cont lactulose  Back pain Lumbar MRI with DDD but  no concern for discitis or osteomyelitis.  No fracture.  Lactic acidosis Resolved.  Obesity (BMI 30-39.9) Body mass index is 30.57 kg/m.  Physical deconditioning PT/OT  Hypothyroidism Continue home Synthroid.  Right shoulder pain Chronic.  Unchanged per  patient.  Seems to have improved. -Tylenol as needed  Pancytopenia ([place]) Likely due to liver cirrhosis.  Leukopenia resolved.  Thrombocytopenia improving.  H&H stable. -Continue monitoring  Portal hypertension ([place]) S/p tips. Doesn't appear volume overloaded    DVT prophylaxis:  SCDs Start: 05/14/22 1608  Code Status: Full code Family Communication:  Level of care: Med-Surg Status is: Inpatient  Final disposition: SNF  Sch Meds:  Scheduled Meds:  aspirin EC  81 mg Oral Daily   atorvastatin  20 mg Oral QHS   Chlorhexidine Gluconate Cloth  6 each Topical Q0600   insulin aspart  0-15 Units Subcutaneous TID WC   insulin aspart  10 Units Subcutaneous TID WC   insulin glargine-yfgn  20 Units Subcutaneous QHS   lactulose  40 g Oral TID   levothyroxine  100 mcg Oral Q0600   lidocaine  1 patch Transdermal Q24H   midodrine  5 mg Oral TID WC   pantoprazole  40 mg Oral Daily   rifaximin  550 mg Oral BID   sodium chloride flush  10-40 mL Intracatheter Q12H   terazosin  5 mg Oral QHS   Continuous Infusions:  sodium chloride Stopped (05/21/22 1413)    ceFAZolin (ANCEF) IV 2 g (05/24/22 1317)   PRN Meds:.sodium chloride, acetaminophen, ketotifen, polyvinyl alcohol, sodium chloride flush, trolamine salicylate  Antimicrobials: Anti-infectives (From admission, onward)    Start     Dose/Rate Route Frequency Ordered Stop   05/17/22 2200  ceFAZolin (ANCEF) IVPB 2g/100 mL premix        2 g 200 mL/hr over 30 Minutes Intravenous Every 8 hours 05/17/22 1424     05/15/22 1400  rifaximin (XIFAXAN) tablet 550 mg        550 mg Oral 2 times daily 05/15/22 1229     05/15/22 1000  vancomycin (VANCOREADY) IVPB 2000 mg/400 mL  Status:  Discontinued        2,000 mg 200 mL/hr over 120 Minutes Intravenous Every 24 hours 05/15/22 0306 05/17/22 1424   05/14/22 2000  cefTRIAXone (ROCEPHIN) 2 g in sodium chloride 0.9 % 100 mL IVPB  Status:  Discontinued        2 g 200 mL/hr over 30 Minutes  Intravenous Every 24 hours 05/14/22 1617 05/15/22 0257   05/14/22 1800  azithromycin (ZITHROMAX) 500 mg in sodium chloride 0.9 % 250 mL IVPB  Status:  Discontinued        500 mg 250 mL/hr over 60 Minutes Intravenous Every 24 hours 05/14/22 1617 05/15/22 1512   05/14/22 1330  vancomycin (VANCOREADY) IVPB 1250 mg/250 mL        1,250 mg 166.7 mL/hr over 90 Minutes Intravenous  Once 05/14/22 1220 05/14/22 1435   05/14/22 1215  ceFEPIme (MAXIPIME) 2 g in sodium chloride 0.9 % 100 mL IVPB        2 g 200 mL/hr over 30 Minutes Intravenous  Once 05/14/22 1202 05/14/22 1300   05/14/22 1215  vancomycin (VANCOCIN) IVPB 1000 mg/200 mL premix        1,000 mg 200 mL/hr over 60 Minutes Intravenous  Once 05/14/22 1202 05/14/22 1331        I have personally reviewed the following labs and images: CBC: Recent Labs  Lab 05/19/22 0348 05/21/22  7858 05/22/22 0542 05/23/22 0553  WBC 4.4 5.1 4.6 3.5*  NEUTROABS 2.8  --   --   --   HGB 11.8* 10.8* 10.1* 9.8*  HCT 33.2* 31.2* 29.4* 28.2*  MCV 93.5 92.9 92.7 92.5  PLT 76* 62* 65* 64*   BMP &GFR Recent Labs  Lab 05/18/22 0544 05/20/22 0732 05/21/22 0156 05/22/22 0542 05/23/22 0553 05/24/22 0557  NA 134* 134* 135 135 138  --   K 3.4* 3.9 3.6 3.7 3.9  --   CL 102 103 105 104 107  --   CO2 26 23 29 26 27   --   GLUCOSE 145* 158* 147* 151* 123*  --   BUN 10 8 10 13 12   --   CREATININE 0.74 0.76 1.31* 0.79 0.77  --   CALCIUM 8.3* 8.6* 8.6* 8.6* 8.6*  --   MG 1.6* 1.5* 2.0 1.7 1.6* 1.6*  PHOS 3.1 3.2  --   --   --   --    Estimated Creatinine Clearance: 98.2 mL/min (by C-G formula based on SCr of 0.77 mg/dL). Liver & Pancreas: Recent Labs  Lab 05/18/22 0544 05/19/22 0348 05/20/22 0732  AST 87* 123* 80*  ALT 62* 83* 53*  ALKPHOS 84 106 99  BILITOT 2.0* 1.9* 1.7*  PROT 6.0* 6.4* 6.1*  ALBUMIN 2.7* 2.8* 2.7*   No results for input(s): "LIPASE", "AMYLASE" in the last 168 hours. Recent Labs  Lab 05/18/22 0544 05/19/22 0348  05/20/22 0732  AMMONIA 71* 26 42*   Diabetic: No results for input(s): "HGBA1C" in the last 72 hours. Recent Labs  Lab 05/23/22 1639 05/23/22 2139 05/24/22 0746 05/24/22 1157 05/24/22 1655  GLUCAP 122* 168* 138* 121* 201*   Cardiac Enzymes: No results for input(s): "CKTOTAL", "CKMB", "CKMBINDEX", "TROPONINI" in the last 168 hours. No results for input(s): "PROBNP" in the last 8760 hours. Coagulation Profile: Recent Labs  Lab 05/18/22 0544  INR 1.3*   Thyroid Function Tests: No results for input(s): "TSH", "T4TOTAL", "FREET4", "T3FREE", "THYROIDAB" in the last 72 hours.  Lipid Profile: No results for input(s): "CHOL", "HDL", "LDLCALC", "TRIG", "CHOLHDL", "LDLDIRECT" in the last 72 hours. Anemia Panel: No results for input(s): "VITAMINB12", "FOLATE", "FERRITIN", "TIBC", "IRON", "RETICCTPCT" in the last 72 hours. Urine analysis:    Component Value Date/Time   COLORURINE YELLOW (A) 05/14/2022 1104   APPEARANCEUR CLEAR (A) 05/14/2022 1104   APPEARANCEUR [place] 05/01/2014 1653   LABSPEC 1.021 05/14/2022 1104   LABSPEC 1.015 05/01/2014 1653   PHURINE 7.0 05/14/2022 1104   GLUCOSEU NEGATIVE 05/14/2022 1104   GLUCOSEU Negative 05/01/2014 [place] 05/14/2022 [place] 05/14/2022 1104   BILIRUBINUR Negative 05/01/2014 [place] 05/14/2022 [place] 05/14/2022 1104   NITRITE NEGATIVE 05/14/2022 [place] 05/14/2022 1104   LEUKOCYTESUR Negative 05/01/2014 1653   Sepsis Labs: Invalid input(s): "PROCALCITONIN", "LACTICIDVEN"  Microbiology: Recent Results (from the past 240 hour(s))  MRSA Next Gen by PCR, Nasal     Status: None   Collection Time: 05/14/22  9:29 PM   Specimen: Nasal Mucosa; Nasal Swab  Result Value Ref Range Status   MRSA by PCR Next Gen NOT DETECTED NOT DETECTED Final    Comment: (NOTE) The GeneXpert MRSA Assay (FDA approved for NASAL specimens only), is one component of a  comprehensive MRSA colonization surveillance program. It is not intended to diagnose MRSA infection nor to guide or monitor treatment for MRSA infections. Test performance is  not FDA approved in patients less than 16 years old. Performed at [facility], [address] [place], [place] [address]   Culture, blood (Routine X 2) w Reflex to ID Panel     Status: None   Collection Time: 05/16/22  3:30 PM   Specimen: BLOOD  Result Value Ref Range Status   Specimen Description BLOOD LAC  Final   Special Requests BOTTLES DRAWN AEROBIC AND ANAEROBIC BCAV  Final   Culture   Final    NO GROWTH 5 DAYS Performed at [facility], [address], [place], [place] [address]    Report Status 05/21/2022 FINAL  Final  Culture, blood (Routine X 2) w Reflex to ID Panel     Status: None   Collection Time: 05/16/22  3:35 PM   Specimen: BLOOD  Result Value Ref Range Status   Specimen Description BLOOD RAC  Final   Special Requests BOTTLES DRAWN AEROBIC AND ANAEROBIC BCAV  Final   Culture   Final    NO GROWTH 5 DAYS Performed at [facility], [address] [place], [place] [address]    Report Status 05/21/2022 FINAL  Final    Radiology Studies: [place] EKG SITE RITE  Result Date: 05/23/2022 If Site Rite image not attached, placement could not be confirmed due to current cardiac rhythm.    If 7PM-7AM, please contact night-coverage [email or web address] 05/24/2022, 5:47 PM

## 2022-05-24 NOTE — Progress Notes (Signed)
Peripherally Inserted Central Catheter Placement  The IV Nurse has discussed with the patient and/or persons authorized to consent for the patient, the purpose of this procedure and the potential benefits and risks involved with this procedure.  The benefits include less needle sticks, lab draws from the catheter, and the patient may be discharged home with the catheter. Risks include, but not limited to, infection, bleeding, blood clot (thrombus formation), and puncture of an artery; nerve damage and irregular heartbeat and possibility to perform a PICC exchange if needed/ordered by physician.  Alternatives to this procedure were also discussed.  Bard Power PICC patient education guide, fact sheet on infection prevention and patient information card has been provided to patient /or left at bedside.    PICC Placement Documentation  PICC Single Lumen 11/46/43 Right Basilic 40 cm 0 cm (Active)  Indication for Insertion or Continuance of Line Home intravenous therapies (PICC only) 05/24/22 1022  Exposed Catheter (cm) 0 cm 05/24/22 1022  Site Assessment Clean, Dry, Intact 05/24/22 1022  Line Status Flushed;Saline locked;Blood return noted 05/24/22 1022  Dressing Type Transparent;Securing device 05/24/22 1022  Dressing Status Antimicrobial disc in place;Clean, Dry, Intact 05/24/22 1022  Safety Lock Not Applicable 14/27/67 0110  Line Adjustment (NICU/IV Team Only) No 05/24/22 1022  Dressing Intervention New dressing;Other (Comment) 05/24/22 1022  Dressing Change Due 05/31/22 05/24/22 Bland, Mirinda [place] 05/24/2022, 10:24 AM

## 2022-05-24 NOTE — Progress Notes (Signed)
Physical Therapy Treatment Patient Details Name: Joshua Berry MRN: [id number] DOB: Sep 22, 1946 Today's Date: 05/24/2022   History of Present Illness Pt is a 76 y/o M who presented with c/o malaise & AMS. Vital signs showed shock physiology. PMH: DM, Liver cirrhosis, dCHF, essential HTN, dyslipidemia, arthritis, heart murmur, NASH, sleep apnea    PT Comments    Patient alert, agreeable to PT, up in recliner. Reported some R shoulder pain with mobility. He was able to sit <> stand twice with CGA and RW. He ambulated two bouts of 26f of ambulation, CGA with RW. He exhibited short, shuffled steps, no LOB noted but the patient did fatigue quickly. Returned to recliner with all needs in reach. The patient would benefit from further skilled PT intervention to continue to progress towards goals. Recommendation remains appropriate.       Recommendations for follow up therapy are one component of a multi-disciplinary discharge planning process, led by the attending physician.  Recommendations may be updated based on patient status, additional functional criteria and insurance authorization.  Follow Up Recommendations  Skilled nursing-short term rehab (<3 hours/day)     Assistance Recommended at Discharge Frequent or constant Supervision/Assistance  Patient can return home with the following A little help with walking and/or transfers;A lot of help with bathing/dressing/bathroom;Assistance with cooking/housework;Direct supervision/assist for medications management;Help with stairs or ramp for entrance   Equipment Recommendations  Rolling walker (2 wheels)    Recommendations for Other Services       Precautions / Restrictions Precautions Precautions: Fall Restrictions Weight Bearing Restrictions: No     Mobility  Bed Mobility               General bed mobility comments: pt in recliner and returned to recliner    Transfers Overall transfer level: Needs assistance Equipment used:  Rolling walker (2 wheels) Transfers: Sit to/from Stand Sit to Stand: Min guard                Ambulation/Gait Ambulation/Gait assistance: Min guard Gait Distance (Feet):  (417f twice) Assistive device: Rolling walker (2 wheels) Gait Pattern/deviations: Trunk flexed, Step-through pattern Gait velocity: decreased     General Gait Details: fatigues quickly, a seated rest break between bouts of ambulation.   Stairs             Wheelchair Mobility    Modified Rankin (Stroke Patients Only)       Balance Overall balance assessment: Needs assistance Sitting-balance support: Feet supported, No upper extremity supported Sitting balance-Leahy Scale: Good Sitting balance - Comments: supervision static sitting   Standing balance support: Bilateral upper extremity supported Standing balance-Leahy Scale: Fair                              Cognition Arousal/Alertness: Awake/alert Behavior During Therapy: WFL for tasks assessed/performed Overall Cognitive Status: Within Functional Limits for tasks assessed                                 General Comments: pt is A and O x 3        Exercises      General Comments        Pertinent Vitals/Pain Pain Assessment Pain Assessment: 0-10 Pain Score: 2  Pain Location: low back, R shoulder with flexion Pain Descriptors / Indicators: Grimacing, Discomfort, Aching Pain Intervention(s): Limited activity within patient's tolerance, Monitored during session,  Repositioned    Home Living                          Prior Function            PT Goals (current goals can now be found in the care plan section) Progress towards PT goals: Progressing toward goals    Frequency    Min 2X/week      PT Plan Current plan remains appropriate    Co-evaluation       OT goals addressed during session: ADL's and self-care      AM-PAC PT "6 Clicks" Mobility   Outcome Measure  Help  needed turning from your back to your side while in a flat bed without using bedrails?: A Little Help needed moving from lying on your back to sitting on the side of a flat bed without using bedrails?: A Little Help needed moving to and from a bed to a chair (including a wheelchair)?: A Little Help needed standing up from a chair using your arms (e.g., wheelchair or bedside chair)?: A Little Help needed to walk in hospital room?: A Little Help needed climbing 3-5 steps with a railing? : A Lot 6 Click Score: 17    End of Session Equipment Utilized During Treatment: Gait belt Activity Tolerance: Patient tolerated treatment well Patient left: in chair;with call bell/phone within reach;with chair alarm set Nurse Communication: Mobility status PT Visit Diagnosis: Unsteadiness on feet (R26.81);Muscle weakness (generalized) (M62.81);Difficulty in walking, not elsewhere classified (R26.2)     Time: 6283-1517 PT Time Calculation (min) (ACUTE ONLY): 12 min  Charges:  $Therapeutic Activity: 8-22 mins                     Lieutenant Diego PT, DPT 3:56 PM,05/24/22

## 2022-05-25 DIAGNOSIS — A412 Sepsis due to unspecified staphylococcus: Secondary | ICD-10-CM | POA: Diagnosis not present

## 2022-05-25 DIAGNOSIS — R6521 Severe sepsis with septic shock: Secondary | ICD-10-CM | POA: Diagnosis not present

## 2022-05-25 LAB — GLUCOSE, CAPILLARY
Glucose-Capillary: 121 mg/dL — ABNORMAL HIGH (ref 70–99)
Glucose-Capillary: 245 mg/dL — ABNORMAL HIGH (ref 70–99)
Glucose-Capillary: 124 mg/dL — ABNORMAL HIGH (ref 70–99)
Glucose-Capillary: 196 mg/dL — ABNORMAL HIGH (ref 70–99)

## 2022-05-25 LAB — MAGNESIUM: Magnesium: 1.6 mg/dL — ABNORMAL LOW (ref 1.7–2.4)

## 2022-05-25 MED ORDER — MAGNESIUM SULFATE 4 GM/100ML IV SOLN
4.0000 g | Freq: Once | INTRAVENOUS | Status: AC
  Administered 2022-05-25: 4 g via INTRAVENOUS
  Filled 2022-05-25: qty 100

## 2022-05-25 MED ORDER — IBUPROFEN 400 MG PO TABS
400.0000 mg | ORAL_TABLET | Freq: Three times a day (TID) | ORAL | Status: DC | PRN
Start: 2022-05-25 — End: 2022-05-28
  Administered 2022-05-25 – 2022-05-27 (×4): 400 mg via ORAL
  Filled 2022-05-25 (×5): qty 1

## 2022-05-25 MED ORDER — MIDODRINE HCL 5 MG PO TABS
5.0000 mg | ORAL_TABLET | Freq: Two times a day (BID) | ORAL | Status: DC
  Administered 2022-05-26: 5 mg via ORAL
  Filled 2022-05-25: qty 1

## 2022-05-25 MED ORDER — MAGNESIUM OXIDE 400 MG PO TABS
400.0000 mg | ORAL_TABLET | Freq: Two times a day (BID) | ORAL | Status: DC
Start: 2022-05-25 — End: 2022-05-26
  Administered 2022-05-25 (×2): 400 mg via ORAL
  Filled 2022-05-25 (×4): qty 1

## 2022-05-25 NOTE — TOC Progression Note (Signed)
Transition of Care [facility]) - Progression Note    Patient Details  Name: Joshua Berry MRN: [id number] Date of Birth: 22-Mar-1946  Transition of Care [facility]) CM/SW [name], LCSW Phone Number: 05/25/2022, 10:25 AM  Clinical Narrative:  Updated patient. He wants to see if being a resident at the [place] next door to [facility] would help at all in regards to getting him in there. Told him they are currently on hold for [facility] admissions but will check. Left message for admissions coordinator. May not hear anything until Monday.   Expected Discharge Plan: [place] Barriers to Discharge: Continued Medical Work up  Expected Discharge Plan and Services Expected Discharge Plan: [place] arrangements for the past 2 months: [place]: [place] Date [place]: 05/17/22   Representative spoke with at [place]: [place] ([facility])   Social Determinants of Health ([place]) Interventions    Readmission Risk Interventions    05/17/2022    1:45 PM  Readmission Risk Prevention Plan  Transportation Screening Complete  PCP or Specialist Appt within 5-7 Days Complete  Home Care Screening Complete  Medication Review (RN CM) Complete

## 2022-05-25 NOTE — Progress Notes (Signed)
PROGRESS NOTE  Joshua Berry PXT:[id number] DOB: [birth year]   PCP: Lorelei Pont, MD  Patient is from: Home.  Lives with wife.  Reports using walker and cane at baseline.  DOA: 05/14/2022 LOS: 44  Chief complaints Chief Complaint  Patient presents with   Altered Mental Status     Brief Narrative / Interim history: 76 year old M with PMH of liver cirrhosis, portal HTN, GIB/GAVE, diastolic CHF, pancytopenia, OSA, TAVR, poor dentition and chronic right shoulder pain presenting with malaise and altered mental status and admitted to ICU with septic shock due to coagulase-negative staph bacteremia, and AKI requiring vasopressors and IV antibiotics.  BC ID with Staphylococcus species but not aureus or MRSA.  MRSA PCR screen negative.  ID consulted.    Patient was weaned off vasopressor and transferred to [facility] on 05/16/2022. Repeat blood culture NGTD.  TTE without significant finding or vegetation.  MRI lumbar spine with significant DDD but no discitis or osteomyelitis.  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  ID recommends TEE.  Cardiology consulted.   Subjective: Pt was frustrated with the wait on SNF bed.     Objective: Vitals:   05/24/22 1546 05/24/22 2008 05/25/22 0507 05/25/22 0822  BP: (!) 101/54 123/60 118/69 120/72  Pulse: 64 74 62 (!) 58  Resp: 18 18 18 18   Temp: 98.1 F (36.7 C) 98.5 F (36.9 C) 97.9 F (36.6 C) 98 F (36.7 C)  TempSrc: Oral Oral Oral   SpO2: 97% 98% 95% 97%  Weight:      Height:        Examination:  Constitutional: NAD, AAOx3, sitting in recliner HEENT: conjunctivae and lids normal, EOMI CV: No cyanosis.   RESP: normal respiratory effort, on RA Neuro: II - XII grossly intact.     Procedures:  None  Microbiology summarized: 5/30-COVID-19 and influenza PCR nonreactive. 5/30-MRSA PCR screen negative. 5/30-blood culture Staphylococcus caprae  Assessment and Plan: Septic shock due to Staphylococcus  bacteremia Suspect odontogenic source given poor dentition.  Blood culture with Staphylococcus Caprae.  MRSA PCR screen negative.  Sepsis physiology resolved.  Repeat blood cultures NGTD.  MRI lumbar spine with DDD but no suspicion for discitis osteomyelitis. --TEE neg -Infectious disease following. -Vancomycin 5/30>> IV Ancef 6/2>>> -CTX and Zithromax 5/30-5/31. Plan: --cont Ancef until 06/20/22  AKI (acute kidney injury) ([place]) on presentation and again after re-starting of home lasix  Plan: --hold lasix  Cirrhosis of liver  Looks compensated.  Hx TIPS. Mild LFT elevation with mild hyperbilirubinemia likely from sepsis. One bm last 24. Not encephalopathic -Continue rifaximin --cont lactulose  Fall at home, initial encounter Reportedly slipped and fell at home.  No notable injury.  CT head and cervical spine and MRI lumbar spine without acute finding. -Fall precaution --SNF rehab  Hypotension Resolved.   --taper midodrine to 5 mg TID to BID  Chronic diastolic CHF (congestive heart failure) ([place]) TTE in 2020 with LVEF of 55 to 65%.  Seems to be on p.o. Lasix 20 mg daily at home.  Appears euvolemic on exam.  TTE without significant finding.  Controlled IDDM-2 with hyperglycemia A1c 6.1% about 1 month ago.   --cont glargine 20u nightly --cont mealtime insulin 12u TID --SSI TID  Hypokalemia  Monitor and replenish as appropriate  Chronic hypomagnesemia --cont home mag supplement --monitor and replete with IV mag PRN  Acute hepatic encephalopathy (HCC) Resolved --cont lactulose  Back pain Lumbar MRI with DDD but no concern for discitis or osteomyelitis.  No fracture. --tylenol  and Advil PRN  Lactic acidosis Resolved.  Obesity (BMI 30-39.9) Body mass index is 30.57 kg/m.  Physical deconditioning PT/OT --SNF rehab  Hypothyroidism Continue home Synthroid.  Right shoulder pain Chronic.  Unchanged per patient.  Seems to have improved. 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  Leukopenia resolved.  Thrombocytopenia improving.  H&H stable. -Continue monitoring  Portal hypertension ([place]) S/p tips. Doesn't appear volume overloaded    DVT prophylaxis:  SCDs Start: 05/14/22 1608  Code Status: Full code Family Communication:  Level of care: Med-Surg Status is: Inpatient  Final disposition: SNF  Sch Meds:  Scheduled Meds:  aspirin EC  81 mg Oral Daily   atorvastatin  20 mg Oral QHS   Chlorhexidine Gluconate Cloth  6 each Topical Q0600   insulin aspart  0-15 Units Subcutaneous TID WC   insulin aspart  12 Units Subcutaneous TID WC   insulin glargine-yfgn  20 Units Subcutaneous QHS   lactulose  40 g Oral TID   levothyroxine  100 mcg Oral Q0600   lidocaine  1 patch Transdermal Q24H   magnesium oxide  400 mg Oral BID   midodrine  5 mg Oral TID WC   pantoprazole  40 mg Oral Daily   rifaximin  550 mg Oral BID   sodium chloride flush  10-40 mL Intracatheter Q12H   terazosin  5 mg Oral QHS   Continuous Infusions:  sodium chloride Stopped (05/21/22 1413)    ceFAZolin (ANCEF) IV 2 g (05/25/22 1405)   PRN Meds:.sodium chloride, acetaminophen, ibuprofen, ketotifen, polyvinyl alcohol, sodium chloride flush, trolamine salicylate  Antimicrobials: Anti-infectives (From admission, onward)    Start     Dose/Rate Route Frequency Ordered Stop   05/17/22 2200  ceFAZolin (ANCEF) IVPB 2g/100 mL premix        2 g 200 mL/hr over 30 Minutes Intravenous Every 8 hours 05/17/22 1424     05/15/22 1400  rifaximin (XIFAXAN) tablet 550 mg        550 mg Oral 2 times daily 05/15/22 1229     05/15/22 1000  vancomycin (VANCOREADY) IVPB 2000 mg/400 mL  Status:  Discontinued        2,000 mg 200 mL/hr over 120 Minutes Intravenous Every 24 hours 05/15/22 0306 05/17/22 1424   05/14/22 2000  cefTRIAXone (ROCEPHIN) 2 g in sodium chloride 0.9 % 100 mL IVPB  Status:  Discontinued        2 g 200 mL/hr over 30 Minutes Intravenous  Every 24 hours 05/14/22 1617 05/15/22 0257   05/14/22 1800  azithromycin (ZITHROMAX) 500 mg in sodium chloride 0.9 % 250 mL IVPB  Status:  Discontinued        500 mg 250 mL/hr over 60 Minutes Intravenous Every 24 hours 05/14/22 1617 05/15/22 1512   05/14/22 1330  vancomycin (VANCOREADY) IVPB 1250 mg/250 mL        1,250 mg 166.7 mL/hr over 90 Minutes Intravenous  Once 05/14/22 1220 05/14/22 1435   05/14/22 1215  ceFEPIme (MAXIPIME) 2 g in sodium chloride 0.9 % 100 mL IVPB        2 g 200 mL/hr over 30 Minutes Intravenous  Once 05/14/22 1202 05/14/22 1300   05/14/22 1215  vancomycin (VANCOCIN) IVPB 1000 mg/200 mL premix        1,000 mg 200 mL/hr over 60 Minutes Intravenous  Once 05/14/22 1202 05/14/22 1331        I have personally reviewed the following labs and images: CBC:  Recent Labs  Lab 05/19/22 0348 05/21/22 0156 05/22/22 0542 05/23/22 0553  WBC 4.4 5.1 4.6 3.5*  NEUTROABS 2.8  --   --   --   HGB 11.8* 10.8* 10.1* 9.8*  HCT 33.2* 31.2* 29.4* 28.2*  MCV 93.5 92.9 92.7 92.5  PLT 76* 62* 65* 64*   BMP &GFR Recent Labs  Lab 05/20/22 0732 05/21/22 0156 05/22/22 0542 05/23/22 0553 05/24/22 0557 05/25/22 0529  NA 134* 135 135 138  --   --   K 3.9 3.6 3.7 3.9  --   --   CL 103 105 104 107  --   --   CO2 23 29 26 27   --   --   GLUCOSE 158* 147* 151* 123*  --   --   BUN 8 10 13 12   --   --   CREATININE 0.76 1.31* 0.79 0.77  --   --   CALCIUM 8.6* 8.6* 8.6* 8.6*  --   --   MG 1.5* 2.0 1.7 1.6* 1.6* 1.6*  PHOS 3.2  --   --   --   --   --    Estimated Creatinine Clearance: 98.2 mL/min (by C-G formula based on SCr of 0.77 mg/dL). Liver & Pancreas: Recent Labs  Lab 05/19/22 0348 05/20/22 0732  AST 123* 80*  ALT 83* 53*  ALKPHOS 106 99  BILITOT 1.9* 1.7*  PROT 6.4* 6.1*  ALBUMIN 2.8* 2.7*   No results for input(s): "LIPASE", "AMYLASE" in the last 168 hours. Recent Labs  Lab 05/19/22 0348 05/20/22 0732  AMMONIA 26 42*   Diabetic: No results for input(s):  "HGBA1C" in the last 72 hours. Recent Labs  Lab 05/24/22 1157 05/24/22 1655 05/24/22 2121 05/25/22 0751 05/25/22 1148  GLUCAP 121* 201* 89 121* 245*   Cardiac Enzymes: No results for input(s): "CKTOTAL", "CKMB", "CKMBINDEX", "TROPONINI" in the last 168 hours. No results for input(s): "PROBNP" in the last 8760 hours. Coagulation Profile: No results for input(s): "INR", "PROTIME" in the last 168 hours.  Thyroid Function Tests: No results for input(s): "TSH", "T4TOTAL", "FREET4", "T3FREE", "THYROIDAB" in the last 72 hours.  Lipid Profile: No results for input(s): "CHOL", "HDL", "LDLCALC", "TRIG", "CHOLHDL", "LDLDIRECT" in the last 72 hours. Anemia Panel: No results for input(s): "VITAMINB12", "FOLATE", "FERRITIN", "TIBC", "IRON", "RETICCTPCT" in the last 72 hours. Urine analysis:    Component Value Date/Time   COLORURINE YELLOW (A) 05/14/2022 1104   APPEARANCEUR CLEAR (A) 05/14/2022 1104   APPEARANCEUR Hazy 05/01/2014 1653   LABSPEC 1.021 05/14/2022 1104   LABSPEC 1.015 05/01/2014 1653   PHURINE 7.0 05/14/2022 1104   GLUCOSEU NEGATIVE 05/14/2022 1104   GLUCOSEU Negative 05/01/2014 [place] 05/14/2022 [place] 05/14/2022 1104   BILIRUBINUR Negative 05/01/2014 [place] 05/14/2022 [place] 05/14/2022 1104   NITRITE NEGATIVE 05/14/2022 [place] 05/14/2022 1104   LEUKOCYTESUR Negative 05/01/2014 1653   Sepsis Labs: Invalid input(s): "PROCALCITONIN", "LACTICIDVEN"  Microbiology: Recent Results (from the past 240 hour(s))  Culture, blood (Routine X 2) w Reflex to ID Panel     Status: None   Collection Time: 05/16/22  3:30 PM   Specimen: BLOOD  Result Value Ref Range Status   Specimen Description BLOOD LAC  Final   Special Requests BOTTLES DRAWN AEROBIC AND ANAEROBIC BCAV  Final   Culture   Final    NO GROWTH 5 DAYS Performed at [facility], [place],  [place],  [place] [address]    Report Status 05/21/2022 FINAL  Final  Culture, blood (Routine X 2) w Reflex to ID Panel     Status: None   Collection Time: 05/16/22  3:35 PM   Specimen: BLOOD  Result Value Ref Range Status   Specimen Description BLOOD RAC  Final   Special Requests BOTTLES DRAWN AEROBIC AND ANAEROBIC BCAV  Final   Culture   Final    NO GROWTH 5 DAYS Performed at [facility], [address], [place], [place] [address]    Report Status 05/21/2022 FINAL  Final    Radiology Studies: No results found.   If 7PM-7AM, please contact night-coverage [email or web address] 05/25/2022, 3:08 PM

## 2022-05-26 DIAGNOSIS — A412 Sepsis due to unspecified staphylococcus: Secondary | ICD-10-CM | POA: Diagnosis not present

## 2022-05-26 DIAGNOSIS — R6521 Severe sepsis with septic shock: Secondary | ICD-10-CM | POA: Diagnosis not present

## 2022-05-26 LAB — GLUCOSE, CAPILLARY
Glucose-Capillary: 127 mg/dL — ABNORMAL HIGH (ref 70–99)
Glucose-Capillary: 246 mg/dL — ABNORMAL HIGH (ref 70–99)
Glucose-Capillary: 239 mg/dL — ABNORMAL HIGH (ref 70–99)
Glucose-Capillary: 55 mg/dL — ABNORMAL LOW (ref 70–99)
Glucose-Capillary: 61 mg/dL — ABNORMAL LOW (ref 70–99)
Glucose-Capillary: 148 mg/dL — ABNORMAL HIGH (ref 70–99)

## 2022-05-26 LAB — MAGNESIUM: Magnesium: 1.9 mg/dL (ref 1.7–2.4)

## 2022-05-26 MED ORDER — LIDOCAINE 5 % EX PTCH
2.0000 | MEDICATED_PATCH | CUTANEOUS | Status: DC
  Administered 2022-05-26 – 2022-05-28 (×3): 2 via TRANSDERMAL
  Filled 2022-05-26 (×2): qty 2

## 2022-05-26 MED ORDER — MELATONIN 5 MG PO TABS
5.0000 mg | ORAL_TABLET | Freq: Every evening | ORAL | Status: DC | PRN
Start: 2022-05-26 — End: 2022-05-28
  Administered 2022-05-26 – 2022-05-27 (×2): 5 mg via ORAL
  Filled 2022-05-26 (×2): qty 1

## 2022-05-26 MED ORDER — MAGNESIUM OXIDE 400 MG PO TABS
800.0000 mg | ORAL_TABLET | Freq: Two times a day (BID) | ORAL | Status: DC
  Administered 2022-05-26 – 2022-05-28 (×5): 800 mg via ORAL
  Filled 2022-05-26 (×9): qty 2

## 2022-05-26 MED ORDER — KETOROLAC TROMETHAMINE 15 MG/ML IJ SOLN
15.0000 mg | Freq: Once | INTRAMUSCULAR | Status: AC
  Administered 2022-05-26: 15 mg via INTRAVENOUS
  Filled 2022-05-26: qty 1

## 2022-05-26 NOTE — Progress Notes (Signed)
PROGRESS NOTE  Joshua Berry AYT:[id number] DOB: [birth year]   PCP: Lorelei Pont, MD  Patient is from: Home.  Lives with wife.  Reports using walker and cane at baseline.  DOA: 05/14/2022 LOS: 33  Chief complaints Chief Complaint  Patient presents with   Altered Mental Status     Brief Narrative / Interim history: 76 year old M with PMH of liver cirrhosis, portal HTN, GIB/GAVE, diastolic CHF, pancytopenia, OSA, TAVR, poor dentition and chronic right shoulder pain presenting with malaise and altered mental status and admitted to ICU with septic shock due to coagulase-negative staph bacteremia, and AKI requiring vasopressors and IV antibiotics.  BC ID with Staphylococcus species but not aureus or MRSA.  MRSA PCR screen negative.  ID consulted.    Patient was weaned off vasopressor and transferred to [facility] on 05/16/2022. Repeat blood culture NGTD.  TTE without significant finding or vegetation.  MRI lumbar spine with significant DDD but no discitis or osteomyelitis.  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  ID recommends TEE.  Cardiology consulted.   Subjective: Pt complained of chronic back pain which was exacerbated by sitting all the time.   Objective: Vitals:   05/25/22 1544 05/25/22 1945 05/26/22 0407 05/26/22 0757  BP: 109/60 117/61 132/78 130/73  Pulse: 80 67 60 66  Resp: 18 18 18 18   Temp: 98.2 F (36.8 C) 97.8 F (36.6 C) 98.7 F (37.1 C) 98.3 F (36.8 C)  TempSrc: Oral Oral Oral   SpO2: 98% 100% 98% 97%  Weight:      Height:        Examination:  Constitutional: NAD, AAOx3, sitting in recliner HEENT: conjunctivae and lids normal, EOMI CV: No cyanosis.   RESP: normal respiratory effort, on RA Neuro: II - XII grossly intact.   Psych: Normal mood and affect.  Appropriate judgement and reason   Procedures:  None  Microbiology summarized: 5/30-COVID-19 and influenza PCR nonreactive. 5/30-MRSA PCR screen negative. 5/30-blood  culture Staphylococcus caprae  Assessment and Plan: Septic shock due to Staphylococcus bacteremia Suspect odontogenic source given poor dentition.  Blood culture with Staphylococcus Caprae.  MRSA PCR screen negative.  Sepsis physiology resolved.  Repeat blood cultures NGTD.  MRI lumbar spine with DDD but no suspicion for discitis osteomyelitis. --TEE neg -Infectious disease following. -Vancomycin 5/30>> IV Ancef 6/2>>> -CTX and Zithromax 5/30-5/31. Plan: --cont Ancef until 06/20/22  AKI (acute kidney injury) ([place]) on presentation and again after re-starting of home lasix  Plan: --hold lasix  Cirrhosis of liver  Looks compensated.  Hx TIPS. Mild LFT elevation with mild hyperbilirubinemia likely from sepsis. One bm last 24. Not encephalopathic -Continue rifaximin --cont lactulose  Fall at home, initial encounter Reportedly slipped and fell at home.  No notable injury.  CT head and cervical spine and MRI lumbar spine without acute finding. -Fall precaution --SNF rehab  Hypotension Resolved.   --d/c midodrine today  Chronic diastolic CHF (congestive heart failure) ([place]) TTE in 2020 with LVEF of 55 to 65%.  Seems to be on p.o. Lasix 20 mg daily at home.  Appears euvolemic on exam.  TTE without significant finding.  Controlled IDDM-2 with hyperglycemia A1c 6.1% about 1 month ago.   --cont glargine 20u nightly --cont mealtime insulin 12u TID --SSI TID  Hypokalemia  Monitor and replenish as appropriate  Chronic hypomagnesemia --increase home mag to 800 mg BID --monitor and replete with IV mag PRN  Acute hepatic encephalopathy (HCC) Resolved --cont lactulose  Back pain Lumbar MRI with DDD but no  concern for discitis or osteomyelitis.  No fracture. --tylenol and Advil PRN --lidocaine patch  Lactic acidosis Resolved.  Obesity (BMI 30-39.9) Body mass index is 30.57 kg/m.  Physical deconditioning PT/OT --SNF rehab  Hypothyroidism Continue home Synthroid.  Right  shoulder pain Chronic.  Unchanged per patient.  Seems to have improved. 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  Leukopenia resolved.  Thrombocytopenia improving.  H&H stable. -Continue monitoring  Portal hypertension ([place]) S/p tips. Doesn't appear volume overloaded    DVT prophylaxis:  SCDs Start: 05/14/22 1608  Code Status: Full code Family Communication:  Level of care: Med-Surg Status is: Inpatient  Final disposition: SNF  Sch Meds:  Scheduled Meds:  aspirin EC  81 mg Oral Daily   atorvastatin  20 mg Oral QHS   Chlorhexidine Gluconate Cloth  6 each Topical Q0600   insulin aspart  0-15 Units Subcutaneous TID WC   insulin aspart  12 Units Subcutaneous TID WC   insulin glargine-yfgn  20 Units Subcutaneous QHS   lactulose  40 g Oral TID   levothyroxine  100 mcg Oral Q0600   [START ON 05/27/2022] lidocaine  2 patch Transdermal Q24H   magnesium oxide  800 mg Oral BID   midodrine  5 mg Oral BID WC   pantoprazole  40 mg Oral Daily   rifaximin  550 mg Oral BID   sodium chloride flush  10-40 mL Intracatheter Q12H   terazosin  5 mg Oral QHS   Continuous Infusions:  sodium chloride Stopped (05/21/22 1413)    ceFAZolin (ANCEF) IV Stopped (05/26/22 0656)   PRN Meds:.sodium chloride, acetaminophen, ibuprofen, ketotifen, polyvinyl alcohol, sodium chloride flush, trolamine salicylate  Antimicrobials: Anti-infectives (From admission, onward)    Start     Dose/Rate Route Frequency Ordered Stop   05/17/22 2200  ceFAZolin (ANCEF) IVPB 2g/100 mL premix        2 g 200 mL/hr over 30 Minutes Intravenous Every 8 hours 05/17/22 1424     05/15/22 1400  rifaximin (XIFAXAN) tablet 550 mg        550 mg Oral 2 times daily 05/15/22 1229     05/15/22 1000  vancomycin (VANCOREADY) IVPB 2000 mg/400 mL  Status:  Discontinued        2,000 mg 200 mL/hr over 120 Minutes Intravenous Every 24 hours 05/15/22 0306 05/17/22 1424   05/14/22 2000  cefTRIAXone  (ROCEPHIN) 2 g in sodium chloride 0.9 % 100 mL IVPB  Status:  Discontinued        2 g 200 mL/hr over 30 Minutes Intravenous Every 24 hours 05/14/22 1617 05/15/22 0257   05/14/22 1800  azithromycin (ZITHROMAX) 500 mg in sodium chloride 0.9 % 250 mL IVPB  Status:  Discontinued        500 mg 250 mL/hr over 60 Minutes Intravenous Every 24 hours 05/14/22 1617 05/15/22 1512   05/14/22 1330  vancomycin (VANCOREADY) IVPB 1250 mg/250 mL        1,250 mg 166.7 mL/hr over 90 Minutes Intravenous  Once 05/14/22 1220 05/14/22 1435   05/14/22 1215  ceFEPIme (MAXIPIME) 2 g in sodium chloride 0.9 % 100 mL IVPB        2 g 200 mL/hr over 30 Minutes Intravenous  Once 05/14/22 1202 05/14/22 1300   05/14/22 1215  vancomycin (VANCOCIN) IVPB 1000 mg/200 mL premix        1,000 mg 200 mL/hr over 60 Minutes Intravenous  Once 05/14/22 1202 05/14/22 1331  I have personally reviewed the following labs and images: CBC: Recent Labs  Lab 05/21/22 0156 05/22/22 0542 05/23/22 0553  WBC 5.1 4.6 3.5*  HGB 10.8* 10.1* 9.8*  HCT 31.2* 29.4* 28.2*  MCV 92.9 92.7 92.5  PLT 62* 65* 64*   BMP &GFR Recent Labs  Lab 05/20/22 0732 05/21/22 0156 05/22/22 0542 05/23/22 0553 05/24/22 0557 05/25/22 0529 05/26/22 0448  NA 134* 135 135 138  --   --   --   K 3.9 3.6 3.7 3.9  --   --   --   CL 103 105 104 107  --   --   --   CO2 23 29 26 27   --   --   --   GLUCOSE 158* 147* 151* 123*  --   --   --   BUN 8 10 13 12   --   --   --   CREATININE 0.76 1.31* 0.79 0.77  --   --   --   CALCIUM 8.6* 8.6* 8.6* 8.6*  --   --   --   MG 1.5* 2.0 1.7 1.6* 1.6* 1.6* 1.9  PHOS 3.2  --   --   --   --   --   --    Estimated Creatinine Clearance: 98.2 mL/min (by C-G formula based on SCr of 0.77 mg/dL). Liver & Pancreas: Recent Labs  Lab 05/20/22 0732  AST 80*  ALT 53*  ALKPHOS 99  BILITOT 1.7*  PROT 6.1*  ALBUMIN 2.7*   No results for input(s): "LIPASE", "AMYLASE" in the last 168 hours. Recent Labs  Lab  05/20/22 0732  AMMONIA 42*   Diabetic: No results for input(s): "HGBA1C" in the last 72 hours. Recent Labs  Lab 05/25/22 1148 05/25/22 1611 05/25/22 2106 05/26/22 0755 05/26/22 1126  GLUCAP 245* 124* 196* 127* 246*   Cardiac Enzymes: No results for input(s): "CKTOTAL", "CKMB", "CKMBINDEX", "TROPONINI" in the last 168 hours. No results for input(s): "PROBNP" in the last 8760 hours. Coagulation Profile: No results for input(s): "INR", "PROTIME" in the last 168 hours.  Thyroid Function Tests: No results for input(s): "TSH", "T4TOTAL", "FREET4", "T3FREE", "THYROIDAB" in the last 72 hours.  Lipid Profile: No results for input(s): "CHOL", "HDL", "LDLCALC", "TRIG", "CHOLHDL", "LDLDIRECT" in the last 72 hours. Anemia Panel: No results for input(s): "VITAMINB12", "FOLATE", "FERRITIN", "TIBC", "IRON", "RETICCTPCT" in the last 72 hours. Urine analysis:    Component Value Date/Time   COLORURINE YELLOW (A) 05/14/2022 1104   APPEARANCEUR CLEAR (A) 05/14/2022 1104   APPEARANCEUR Hazy 05/01/2014 1653   LABSPEC 1.021 05/14/2022 1104   LABSPEC 1.015 05/01/2014 1653   PHURINE 7.0 05/14/2022 1104   GLUCOSEU NEGATIVE 05/14/2022 1104   GLUCOSEU Negative 05/01/2014 [place] 05/14/2022 [place] 05/14/2022 1104   BILIRUBINUR Negative 05/01/2014 [place] 05/14/2022 [place] 05/14/2022 1104   NITRITE NEGATIVE 05/14/2022 [place] 05/14/2022 1104   LEUKOCYTESUR Negative 05/01/2014 1653   Sepsis Labs: Invalid input(s): "PROCALCITONIN", "LACTICIDVEN"  Microbiology: Recent Results (from the past 240 hour(s))  Culture, blood (Routine X 2) w Reflex to ID Panel     Status: None   Collection Time: 05/16/22  3:30 PM   Specimen: BLOOD  Result Value Ref Range Status   Specimen Description BLOOD LAC  Final   Special Requests BOTTLES DRAWN AEROBIC AND ANAEROBIC BCAV  Final   Culture   Final    NO GROWTH 5  DAYS Performed at [facility], [address] [place], [place] [address]    Report Status 05/21/2022 FINAL  Final  Culture, blood (Routine X 2) w Reflex to ID Panel     Status: None   Collection Time: 05/16/22  3:35 PM   Specimen: BLOOD  Result Value Ref Range Status   Specimen Description BLOOD RAC  Final   Special Requests BOTTLES DRAWN AEROBIC AND ANAEROBIC BCAV  Final   Culture   Final    NO GROWTH 5 DAYS Performed at [facility], [address] [place], [place] [address]    Report Status 05/21/2022 FINAL  Final    Radiology Studies: No results found.   If 7PM-7AM, please contact night-coverage [email or web address] 05/26/2022, 1:57 PM

## 2022-05-27 DIAGNOSIS — R6521 Severe sepsis with septic shock: Secondary | ICD-10-CM | POA: Diagnosis not present

## 2022-05-27 DIAGNOSIS — A412 Sepsis due to unspecified staphylococcus: Secondary | ICD-10-CM | POA: Diagnosis not present

## 2022-05-27 LAB — GLUCOSE, CAPILLARY
Glucose-Capillary: 128 mg/dL — ABNORMAL HIGH (ref 70–99)
Glucose-Capillary: 257 mg/dL — ABNORMAL HIGH (ref 70–99)
Glucose-Capillary: 258 mg/dL — ABNORMAL HIGH (ref 70–99)
Glucose-Capillary: 227 mg/dL — ABNORMAL HIGH (ref 70–99)
Glucose-Capillary: 145 mg/dL — ABNORMAL HIGH (ref 70–99)

## 2022-05-27 MED ORDER — INSULIN ASPART 100 UNIT/ML IJ SOLN
12.0000 [IU] | Freq: Three times a day (TID) | INTRAMUSCULAR | Status: DC
  Administered 2022-05-28 (×2): 12 [IU] via SUBCUTANEOUS
  Filled 2022-05-27 (×2): qty 1

## 2022-05-27 MED ORDER — INSULIN GLARGINE-YFGN 100 UNIT/ML ~~LOC~~ SOLN
15.0000 [IU] | Freq: Every day | SUBCUTANEOUS | Status: DC
  Administered 2022-05-27: 15 [IU] via SUBCUTANEOUS
  Filled 2022-05-27 (×2): qty 0.15

## 2022-05-27 MED ORDER — INSULIN ASPART 100 UNIT/ML IJ SOLN
8.0000 [IU] | Freq: Three times a day (TID) | INTRAMUSCULAR | Status: DC
  Administered 2022-05-27 (×3): 8 [IU] via SUBCUTANEOUS
  Filled 2022-05-27 (×2): qty 1

## 2022-05-27 NOTE — Progress Notes (Signed)
Mobility Specialist - Progress Note   05/27/22 1200  Mobility  Activity Transferred to/from [facility];Ambulated with assistance in room  Level of Assistance Standby assist, set-up cues, supervision of patient - no hands on  Assistive Device None  Distance Ambulated (ft) 4 ft  Activity Response Tolerated well  $Mobility charge 1 Mobility     Mobility responded to chair alarm. Upon arrival, pt standing beside chair. Pt knows how to turn off his alarm. Ambulated to [facility] with supervision and opted out of AD use per pt request. Materials engineer. Pt left on [facility] with nursing staff made aware.    Kathee Delton Mobility Specialist 05/27/22, 12:47 PM

## 2022-05-27 NOTE — Progress Notes (Signed)
PROGRESS NOTE  Joshua Berry MBT:[id number] DOB: [birth year]   PCP: Lorelei Pont, MD  Patient is from: Home.  Lives with wife.  Reports using walker and cane at baseline.  DOA: 05/14/2022 LOS: 25  Chief complaints Chief Complaint  Patient presents with   Altered Mental Status     Brief Narrative / Interim history: 76 year old M with PMH of liver cirrhosis, portal HTN, GIB/GAVE, diastolic CHF, pancytopenia, OSA, TAVR, poor dentition and chronic right shoulder pain presenting with malaise and altered mental status and admitted to ICU with septic shock due to coagulase-negative staph bacteremia, and AKI requiring vasopressors and IV antibiotics.  BC ID with Staphylococcus species but not aureus or MRSA.  MRSA PCR screen negative.  ID consulted.    Patient was weaned off vasopressor and transferred to [facility] hospitalist service on 05/16/2022. Repeat blood culture NGTD.  TTE without significant finding or vegetation.  MRI lumbar spine with significant DDD but no discitis or osteomyelitis.  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  ID recommends TEE.  Cardiology consulted.   Subjective: Pt only talked about why he is still waiting on SNF bed.   Objective: Vitals:   05/27/22 0456 05/27/22 0800 05/27/22 1400 05/27/22 1704  BP: 104/64 (!) 118/57 115/60 103/61  Pulse: (!) 59 65 69 68  Resp: 18 16 14 18   Temp: 97.9 F (36.6 C) 98.2 F (36.8 C) 98.5 F (36.9 C) 99.1 F (37.3 C)  TempSrc: Oral Oral Oral   SpO2: 97% 98% 99% 98%  Weight:      Height:        Examination:  Constitutional: NAD, AAOx3 HEENT: conjunctivae and lids normal, EOMI CV: No cyanosis.   RESP: normal respiratory effort, on RA Neuro: II - XII grossly intact.   Psych: Normal mood and affect.  Appropriate judgement and reason   Procedures:  None  Microbiology summarized: 5/30-COVID-19 and influenza PCR nonreactive. 5/30-MRSA PCR screen negative. 5/30-blood culture Staphylococcus  caprae  Assessment and Plan: Septic shock due to Staphylococcus bacteremia Suspect odontogenic source given poor dentition.  Blood culture with Staphylococcus Caprae.  MRSA PCR screen negative.  Sepsis physiology resolved.  Repeat blood cultures NGTD.  MRI lumbar spine with DDD but no suspicion for discitis osteomyelitis. --TEE neg -Infectious disease following. -Vancomycin 5/30>> IV Ancef 6/2>>> -CTX and Zithromax 5/30-5/31. Plan: --cont Ancef until 06/20/22  AKI (acute kidney injury) ([place]) on presentation and again after re-starting of home lasix  Plan: --hold lasix  Cirrhosis of liver  Looks compensated.  Hx TIPS. Mild LFT elevation with mild hyperbilirubinemia likely from sepsis. One bm last 24. Not encephalopathic -Continue rifaximin --cont lactulose  Fall at home, initial encounter Reportedly slipped and fell at home.  No notable injury.  CT head and cervical spine and MRI lumbar spine without acute finding. -Fall precaution --SNF rehab  Hypotension Resolved.   --d/c'ed midodrine   Chronic diastolic CHF (congestive heart failure) ([place]) TTE in 2020 with LVEF of 55 to 65%.  Seems to be on p.o. Lasix 20 mg daily at home.  Appears euvolemic on exam.  TTE without significant finding.  Controlled IDDM-2 with hyperglycemia A1c 6.1% about 1 month ago.   --reduce glargine to 15u nightly --cont mealtime insulin 12u TID --SSI TID  Hypokalemia  Monitor and replenish as appropriate  Chronic hypomagnesemia --increase home mag to 800 mg BID --monitor and replete with IV mag PRN  Acute hepatic encephalopathy (HCC) Resolved --cont lactulose  Back pain Lumbar MRI with DDD but no concern for  discitis or osteomyelitis.  No fracture. --tylenol and Advil PRN --lidocaine patch  Lactic acidosis Resolved.  Obesity (BMI 30-39.9) Body mass index is 30.57 kg/m.  Physical deconditioning PT/OT --SNF rehab  Hypothyroidism Continue home Synthroid.  Right shoulder  pain Chronic.  Unchanged per patient.  Seems to have improved. 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  Leukopenia resolved.  Thrombocytopenia improving.  H&H stable. -Continue monitoring  Portal hypertension ([place]) S/p tips. Doesn't appear volume overloaded    DVT prophylaxis:  SCDs Start: 05/14/22 1608  Code Status: Full code Family Communication:  Level of care: Med-Surg Status is: Inpatient  Final disposition: SNF  Sch Meds:  Scheduled Meds:  aspirin EC  81 mg Oral Daily   atorvastatin  20 mg Oral QHS   Chlorhexidine Gluconate Cloth  6 each Topical Q0600   insulin aspart  0-15 Units Subcutaneous TID WC   insulin aspart  8 Units Subcutaneous TID WC   insulin glargine-yfgn  20 Units Subcutaneous QHS   lactulose  40 g Oral TID   levothyroxine  100 mcg Oral Q0600   lidocaine  2 patch Transdermal Q24H   magnesium oxide  800 mg Oral BID   pantoprazole  40 mg Oral Daily   rifaximin  550 mg Oral BID   sodium chloride flush  10-40 mL Intracatheter Q12H   terazosin  5 mg Oral QHS   Continuous Infusions:  sodium chloride Stopped (05/21/22 1413)    ceFAZolin (ANCEF) IV Stopped (05/27/22 1516)   PRN Meds:.sodium chloride, acetaminophen, ibuprofen, ketotifen, melatonin, polyvinyl alcohol, sodium chloride flush, trolamine salicylate  Antimicrobials: Anti-infectives (From admission, onward)    Start     Dose/Rate Route Frequency Ordered Stop   05/17/22 2200  ceFAZolin (ANCEF) IVPB 2g/100 mL premix        2 g 200 mL/hr over 30 Minutes Intravenous Every 8 hours 05/17/22 1424     05/15/22 1400  rifaximin (XIFAXAN) tablet 550 mg        550 mg Oral 2 times daily 05/15/22 1229     05/15/22 1000  vancomycin (VANCOREADY) IVPB 2000 mg/400 mL  Status:  Discontinued        2,000 mg 200 mL/hr over 120 Minutes Intravenous Every 24 hours 05/15/22 0306 05/17/22 1424   05/14/22 2000  cefTRIAXone (ROCEPHIN) 2 g in sodium chloride 0.9 % 100 mL IVPB   Status:  Discontinued        2 g 200 mL/hr over 30 Minutes Intravenous Every 24 hours 05/14/22 1617 05/15/22 0257   05/14/22 1800  azithromycin (ZITHROMAX) 500 mg in sodium chloride 0.9 % 250 mL IVPB  Status:  Discontinued        500 mg 250 mL/hr over 60 Minutes Intravenous Every 24 hours 05/14/22 1617 05/15/22 1512   05/14/22 1330  vancomycin (VANCOREADY) IVPB 1250 mg/250 mL        1,250 mg 166.7 mL/hr over 90 Minutes Intravenous  Once 05/14/22 1220 05/14/22 1435   05/14/22 1215  ceFEPIme (MAXIPIME) 2 g in sodium chloride 0.9 % 100 mL IVPB        2 g 200 mL/hr over 30 Minutes Intravenous  Once 05/14/22 1202 05/14/22 1300   05/14/22 1215  vancomycin (VANCOCIN) IVPB 1000 mg/200 mL premix        1,000 mg 200 mL/hr over 60 Minutes Intravenous  Once 05/14/22 1202 05/14/22 1331        I have personally reviewed the following labs and images: CBC:  Recent Labs  Lab 05/21/22 0156 05/22/22 0542 05/23/22 0553  WBC 5.1 4.6 3.5*  HGB 10.8* 10.1* 9.8*  HCT 31.2* 29.4* 28.2*  MCV 92.9 92.7 92.5  PLT 62* 65* 64*   BMP &GFR Recent Labs  Lab 05/21/22 0156 05/22/22 0542 05/23/22 0553 05/24/22 0557 05/25/22 0529 05/26/22 0448  NA 135 135 138  --   --   --   K 3.6 3.7 3.9  --   --   --   CL 105 104 107  --   --   --   CO2 29 26 27   --   --   --   GLUCOSE 147* 151* 123*  --   --   --   BUN 10 13 12   --   --   --   CREATININE 1.31* 0.79 0.77  --   --   --   CALCIUM 8.6* 8.6* 8.6*  --   --   --   MG 2.0 1.7 1.6* 1.6* 1.6* 1.9   Estimated Creatinine Clearance: 98.2 mL/min (by C-G formula based on SCr of 0.77 mg/dL). Liver & Pancreas: No results for input(s): "AST", "ALT", "ALKPHOS", "BILITOT", "PROT", "ALBUMIN" in the last 168 hours.  No results for input(s): "LIPASE", "AMYLASE" in the last 168 hours. No results for input(s): "AMMONIA" in the last 168 hours.  Diabetic: No results for input(s): "HGBA1C" in the last 72 hours. Recent Labs  Lab 05/26/22 2147 05/27/22 0839  05/27/22 1232 05/27/22 1611 05/27/22 1701  GLUCAP 148* 128* 257* 258* 227*   Cardiac Enzymes: No results for input(s): "CKTOTAL", "CKMB", "CKMBINDEX", "TROPONINI" in the last 168 hours. No results for input(s): "PROBNP" in the last 8760 hours. Coagulation Profile: No results for input(s): "INR", "PROTIME" in the last 168 hours.  Thyroid Function Tests: No results for input(s): "TSH", "T4TOTAL", "FREET4", "T3FREE", "THYROIDAB" in the last 72 hours.  Lipid Profile: No results for input(s): "CHOL", "HDL", "LDLCALC", "TRIG", "CHOLHDL", "LDLDIRECT" in the last 72 hours. Anemia Panel: No results for input(s): "VITAMINB12", "FOLATE", "FERRITIN", "TIBC", "IRON", "RETICCTPCT" in the last 72 hours. Urine analysis:    Component Value Date/Time   COLORURINE YELLOW (A) 05/14/2022 1104   APPEARANCEUR CLEAR (A) 05/14/2022 1104   APPEARANCEUR [place] 05/01/2014 1653   LABSPEC 1.021 05/14/2022 1104   LABSPEC 1.015 05/01/2014 1653   PHURINE 7.0 05/14/2022 1104   GLUCOSEU NEGATIVE 05/14/2022 1104   GLUCOSEU Negative 05/01/2014 [place] 05/14/2022 [place] 05/14/2022 1104   BILIRUBINUR Negative 05/01/2014 [place] 05/14/2022 [place] 05/14/2022 1104   NITRITE NEGATIVE 05/14/2022 [place] 05/14/2022 1104   LEUKOCYTESUR Negative 05/01/2014 1653   Sepsis Labs: Invalid input(s): "PROCALCITONIN", "LACTICIDVEN"  Microbiology: No results found for this or any previous visit (from the past 240 hour(s)).   Radiology Studies: No results found.   If 7PM-7AM, please contact night-coverage [email or web address] 05/27/2022, 8:16 PM

## 2022-05-27 NOTE — Progress Notes (Signed)
Occupational Therapy Treatment Patient Details Name: Joshua Berry MRN: [id number] DOB: 11/15/1946 Today's Date: 05/27/2022   History of present illness Pt is a 76 y/o M who presented with c/o malaise & AMS. Vital signs showed shock physiology. PMH: DM, Liver cirrhosis, dCHF, essential HTN, dyslipidemia, arthritis, heart murmur, NASH, sleep apnea   OT comments  Joshua Berry continues to present with generalized weakness and impaired safety awareness that impacts his ability to safely and independently complete ADLs.  Patient was pleasant and receptive to treatment this date.  OT provided supervision assist for pt to complete bed mobility, ambulation, transfers, and toileting routine using BSC.  Pt used nearby surfaces for BUE support, requiring moderate (5-6) verbal cues from OT for safety awareness and using rolling walker.  OT provided coaching and education re: fall prevention strategies to support safety after discharge, including lifestyle adaptations, compensatory strategies, and home modifications.  Joshua Berry verbalized understanding and denied further questions, but continues to present with limited insight into high fall risk.  He will continue to benefit from skilled OT services in acute setting to support functional strengthening, safety, and independence in ADLs.  Short term rehab remains appropriate discharge recommendation.   Recommendations for follow up therapy are one component of a multi-disciplinary discharge planning process, led by the attending physician.  Recommendations may be updated based on patient status, additional functional criteria and insurance authorization.    Follow Up Recommendations  Skilled nursing-short term rehab (<3 hours/day)    Assistance Recommended at Discharge Frequent or constant Supervision/Assistance  Patient can return home with the following  A little help with walking and/or transfers;A little help with bathing/dressing/bathroom;Help with stairs or  ramp for entrance   Equipment Recommendations  BSC/3in1    Recommendations for Other Services      Precautions / Restrictions Precautions Precautions: Fall Restrictions Weight Bearing Restrictions: No       Mobility Bed Mobility Overal bed mobility: Needs Assistance Bed Mobility: Supine to Sit, Sit to Supine     Supine to sit: Supervision Sit to supine: Supervision   General bed mobility comments: head of bed elevated, use of bedrails    Transfers Overall transfer level: Needs assistance Equipment used: Rolling walker (2 wheels) Transfers: Sit to/from Stand Sit to Stand: Supervision           General transfer comment: pt with limited safety awareness, requires cues to use rolling walker rather than using furniture for UE support     Balance Overall balance assessment: Needs assistance Sitting-balance support: Feet supported, No upper extremity supported Sitting balance-Leahy Scale: Good Sitting balance - Comments: supervision static sitting   Standing balance support: Bilateral upper extremity supported Standing balance-Leahy Scale: Fair Standing balance comment: BUE support on RW or other surfaces                           ADL either performed or assessed with clinical judgement   ADL Overall ADL's : Needs assistance/impaired                                       General ADL Comments: Able to complete basic ADLs with setup/supervision assist, poor safety awareness and ongoing generalized weakness    Extremity/Trunk Assessment Upper Extremity Assessment Upper Extremity Assessment: Generalized weakness   Lower Extremity Assessment Lower Extremity Assessment: Generalized weakness  Vision Baseline Vision/History: 0 No visual deficits     Perception     Praxis      Cognition Arousal/Alertness: Awake/alert Behavior During Therapy: WFL for tasks assessed/performed Overall Cognitive Status: Within Functional  Limits for tasks assessed                                          Exercises Other Exercises Other Exercises: provided extensive education re: fall prevention strategies including home modifications, lifestyle adaptations, compensatory strategies    Shoulder Instructions       General Comments      Pertinent Vitals/ Pain       Pain Assessment Pain Assessment: No/denies pain  Home Living                                          Prior Functioning/Environment              Frequency  Min 2X/week        Progress Toward Goals  OT Goals(current goals can now be found in the care plan section)  Progress towards OT goals: Progressing toward goals  Acute Rehab OT Goals Patient Stated Goal: to go home OT Goal Formulation: With patient Time For Goal Achievement: 05/30/22 Potential to Achieve Goals: Good  Plan Discharge plan remains appropriate;Frequency remains appropriate    Co-evaluation                 AM-PAC OT "6 Clicks" Daily Activity     Outcome Measure   Help from another person eating meals?: None Help from another person taking care of personal grooming?: A Little Help from another person toileting, which includes using toliet, bedpan, or urinal?: A Little Help from another person bathing (including washing, rinsing, drying)?: A Lot Help from another person to put on and taking off regular upper body clothing?: A Little Help from another person to put on and taking off regular lower body clothing?: A Lot 6 Click Score: 17    End of Session Equipment Utilized During Treatment: Rolling walker (2 wheels)  OT Visit Diagnosis: Other abnormalities of gait and mobility (R26.89);Muscle weakness (generalized) (M62.81)   Activity Tolerance Patient tolerated treatment well   Patient Left with call bell/phone within reach;in bed   Nurse Communication          Time: 7741-2878 OT Time Calculation (min): 46  min  Charges: OT General Charges $OT Visit: 1 Visit OT Treatments $Self Care/Home Management : 38-52 mins  Jeneen Montgomery, OTR/L 05/27/22, 3:30 PM

## 2022-05-27 NOTE — TOC Progression Note (Addendum)
Transition of Care [facility]) - Progression Note    Patient Details  Name: CHAO BLAZEJEWSKI MRN: [id number] Date of Birth: 1946-11-04  Transition of Care [facility]) CM/SW [place], LCSW Phone Number: 05/27/2022, 9:33 AM  Clinical Narrative:  [facility] is currently on hold for [place] admissions but [facility] can [facility] since patient lives in the [place] next door.   1:33 pm: [facility] social worker will reach out to [facility] regarding potential admission to [facility]. Left voicemail for [facility] admissions coordinator to see if they can offer a bed if [facility] is unable to accept him.  3:11 pm: Received voicemail from [facility] admissions coordinator. [facility] to [facility] agreement for [facility] to admit there. Admissions coordinator is just waiting on confirmation regarding the contract.  Expected Discharge Plan: [place] Barriers to Discharge: Continued Medical Work up  Expected Discharge Plan and Services Expected Discharge Plan: Early arrangements for the past 2 months: [place] Agency: [place] Date [place]: 05/17/22   Representative spoke with at [place]: [place] [facility])   Social Determinants of Health ([place]) Interventions    Readmission Risk Interventions    05/17/2022    1:45 PM  Readmission Risk Prevention Plan  Transportation Screening Complete  PCP or Specialist Appt within 5-7 Days Complete  Home Care Screening Complete  Medication Review (RN CM) Complete

## 2022-05-27 NOTE — Progress Notes (Signed)
Physical Therapy Treatment Patient Details Name: Joshua Berry MRN: [id number] DOB: 05-28-1946 Today's Date: 05/27/2022   History of Present Illness Pt is a 76 y/o M who presented with c/o malaise & AMS. Vital signs showed shock physiology. PMH: DM, Liver cirrhosis, dCHF, essential HTN, dyslipidemia, arthritis, heart murmur, NASH, sleep apnea    PT Comments    Pt in recliner, fully dressed, states he is going home today. He is eager to participate with therapy. Ambulatory endurance has improved from last session, now able to ambulate 18f + 2531fwith only a standing rest break between. Fatigue was notable by completion. He relied on increased UE support of RW and demonstrated occasional decreased foot clearance in final 10058fAll needs met and belongings in reach. Would benefit from skilled PT to address above deficits and promote optimal return to PLOF. Discharge recommendation remains appropriate.    Recommendations for follow up therapy are one component of a multi-disciplinary discharge planning process, led by the attending physician.  Recommendations may be updated based on patient status, additional functional criteria and insurance authorization.  Follow Up Recommendations  Skilled nursing-short term rehab (<3 hours/day)     Assistance Recommended at Discharge Frequent or constant Supervision/Assistance  Patient can return home with the following A little help with walking and/or transfers;A lot of help with bathing/dressing/bathroom;Assistance with cooking/housework;Direct supervision/assist for medications management;Help with stairs or ramp for entrance   Equipment Recommendations  Rolling walker (2 wheels)    Recommendations for Other Services       Precautions / Restrictions Precautions Precautions: Fall Restrictions Weight Bearing Restrictions: No     Mobility  Bed Mobility               General bed mobility comments: pt in recliner and returned to  recliner    Transfers Overall transfer level: Needs assistance Equipment used: Rolling walker (2 wheels) Transfers: Sit to/from Stand Sit to Stand: Supervision           General transfer comment: close SUP for safety, increased time required to align to chair prior to sitting however performed safely    Ambulation/Gait Ambulation/Gait assistance: Min guard Gait Distance (Feet): 250 Feet Assistive device: Rolling walker (2 wheels) Gait Pattern/deviations: Trunk flexed, Step-through pattern Gait velocity: decreased     General Gait Details: [id number] rest break between. Increased UE support on RW and occasional decreased foot clearance bilaterally as pt fatigued. Forward posture.   Stairs             Wheelchair Mobility    Modified Rankin (Stroke Patients Only)       Balance Overall balance assessment: Needs assistance Sitting-balance support: Feet supported, No upper extremity supported Sitting balance-Leahy Scale: Good     Standing balance support: Bilateral upper extremity supported Standing balance-Leahy Scale: Fair Standing balance comment: BUE support on RW                            Cognition Arousal/Alertness: Awake/alert Behavior During Therapy: WFL for tasks assessed/performed Overall Cognitive Status: Within Functional Limits for tasks assessed                                          Exercises      General Comments        Pertinent Vitals/Pain Pain Assessment Pain Assessment: No/denies pain  Home Living                          Prior Function            PT Goals (current goals can now be found in the care plan section) Acute Rehab PT Goals Patient Stated Goal: " get stronger and go home." PT Goal Formulation: With patient Time For Goal Achievement: 05/30/22 Potential to Achieve Goals: Fair    Frequency    Min 2X/week      PT Plan      Co-evaluation               AM-PAC PT "6 Clicks" Mobility   Outcome Measure  Help needed turning from your back to your side while in a flat bed without using bedrails?: A Little Help needed moving from lying on your back to sitting on the side of a flat bed without using bedrails?: A Little Help needed moving to and from a bed to a chair (including a wheelchair)?: A Little Help needed standing up from a chair using your arms (e.g., wheelchair or bedside chair)?: A Little Help needed to walk in hospital room?: A Little Help needed climbing 3-5 steps with a railing? : A Lot 6 Click Score: 17    End of Session Equipment Utilized During Treatment: Gait belt Activity Tolerance: Patient tolerated treatment well Patient left: in chair;with call bell/phone within reach;with chair alarm set Nurse Communication: Mobility status PT Visit Diagnosis: Unsteadiness on feet (R26.81);Muscle weakness (generalized) (M62.81);Difficulty in walking, not elsewhere classified (R26.2)     Time: 1010-1026 PT Time Calculation (min) (ACUTE ONLY): 16 min  Charges:  $Therapeutic Activity: 8-22 mins                     Patrina Levering PT, DPT

## 2022-05-27 NOTE — Inpatient Diabetes Management (Signed)
Inpatient Diabetes Program Recommendations  AACE/ADA: New Consensus Statement on Inpatient Glycemic Control (2015)  Target Ranges:  Prepandial:   less than 140 mg/dL      Peak postprandial:   less than 180 mg/dL (1-2 hours)      Critically ill patients:  140 - 180 mg/dL   Lab Results  Component Value Date   GLUCAP 257 (H) 05/27/2022   HGBA1C 6.0 (H) 04/07/2022    Review of Glycemic Control  Latest Reference Range & Units 05/26/22 07:55 05/26/22 11:26 05/26/22 16:36 05/26/22 21:08 05/26/22 21:16 05/26/22 21:47 05/27/22 08:39 05/27/22 12:32  Glucose-Capillary 70 - 99 mg/dL 127 (H) 246 (H) 239 (H) 55 (L) 61 (L) 148 (H) 128 (H) 257 (H)  (H): Data is abnormally high (L): Data is abnormally low  Diabetes history: DM2  Outpatient Diabetes medications: Lantus 14 units QD, Novolog 16-22 units TID with meals   Current orders for Inpatient glycemic control: Semglee 20 units QD, Novolog 0-15 units TID and 8 units TID  Inpatient Diabetes Program Recommendations:    Was 55 mg/dL last evening.  Meal coverage was decreased from 12 units to 8 units TID.  PP is elevated at noon.  Fasting CBGs have been around 120 mg/dL  Might consider:  Semglee 15 units QD, Novolog 12 units TID with meals if consumes at least 50%. Will continue to follow while inpatient.  Thank you, Reche Dixon, MSN, [place] Diabetes Coordinator Inpatient Diabetes Program [phone] (team pager from 8a-5p)

## 2022-05-28 DIAGNOSIS — A412 Sepsis due to unspecified staphylococcus: Secondary | ICD-10-CM | POA: Diagnosis not present

## 2022-05-28 DIAGNOSIS — R6521 Severe sepsis with septic shock: Secondary | ICD-10-CM | POA: Diagnosis not present

## 2022-05-28 LAB — GLUCOSE, CAPILLARY
Glucose-Capillary: 91 mg/dL (ref 70–99)
Glucose-Capillary: 259 mg/dL — ABNORMAL HIGH (ref 70–99)

## 2022-05-28 MED ORDER — LIDOCAINE 5 % EX PTCH: 2.0000 | MEDICATED_PATCH | CUTANEOUS | 0 refills | Status: DC

## 2022-05-28 MED ORDER — MAGNESIUM OXIDE 400 MG PO TABS: 800.0000 mg | ORAL_TABLET | Freq: Two times a day (BID) | ORAL | Status: DC

## 2022-05-28 MED ORDER — TROLAMINE SALICYLATE 10 % EX CREA: TOPICAL_CREAM | CUTANEOUS | 0 refills | Status: DC | PRN

## 2022-05-28 MED ORDER — CEFAZOLIN IV (FOR PTA / DISCHARGE USE ONLY): 2.0000 g | Freq: Three times a day (TID) | INTRAVENOUS | 0 refills | Status: AC

## 2022-05-28 NOTE — Discharge Summary (Signed)
Physician Discharge Summary   Joshua Berry  male DOB: 03-11-1946  MPN:[id number]  PCP: Lorelei Pont, MD  Admit date: 05/14/2022 Discharge date: 05/28/2022  Admitted From: home Disposition:  SNF CODE STATUS: Full code  Discharge Instructions     Home infusion instructions   Complete by: As directed    Instructions: Flushing of vascular access device: 0.9% NaCl pre/post medication administration and prn patency; Heparin 100 u/ml, 46m for implanted ports and Heparin 10u/ml, 555mfor all other central venous catheters.   No wound care   Complete by: As directed       Hospital Course:  For full details, please see H&P, progress notes, consult notes and ancillary notes.  Briefly,  WeJERRALD DOVERSPIKEs a [age]ear old M with PMH of liver cirrhosis, portal HTN, GIB/GAVE, diastolic CHF, pancytopenia, OSA, TAVR, poor dentition and chronic right shoulder pain presenting with malaise and altered mental status and admitted to ICU with septic shock due to coagulase-negative staph bacteremia, and AKI requiring vasopressors and IV antibiotics.     Patient was weaned off vasopressor and transferred to [facility] hospitalist service on 05/16/2022.  Septic shock due to Staphylococcus bacteremia Suspect odontogenic source given poor dentition.  Blood culture with Staphylococcus Caprae.  MRSA PCR screen negative.  Sepsis physiology resolved.  Repeat blood cultures NGTD.  MRI lumbar spine with DDD but no suspicion for discitis osteomyelitis. --TEE neg -Infectious disease following. -Vancomycin 5/30>> IV Ancef 6/2>>> -CTX and Zithromax 5/30-5/31. --cont Ancef until 06/20/22   AKI (acute kidney injury) ([place] 1.29 on presentation.  Cr 0.77 prior to discharge.  Home lasix resumed after discharge.   Cirrhosis of liver  compensated.  Hx TIPS. Mild LFT elevation with mild hyperbilirubinemia likely from sepsis.   -Continue home rifaximin and lactulose   Fall at home, initial encounter Reportedly slipped  and fell at home.  No notable injury.  CT head and cervical spine and MRI lumbar spine without acute finding. --SNF rehab   Hypotension Resolved.   --midodrine prescribed during hospitalization, d/c'ed after hypotension resolved.   Chronic diastolic CHF (congestive heart failure) (HCC) TTE in 2020 with LVEF of 55 to 65%. Appears euvolemic on exam.  TTE without significant finding.   --Home lasix 20 mg daily resumed after discharge.   Controlled IDDM-2 with hyperglycemia A1c 6.0% about 1 month ago.   --discharged back on home insulin regimen (see below)   Hypokalemia  Monitored and replenished PRN   Chronic hypomagnesemia --increase home mag to 800 mg BID --monitored and repleted with IV mag PRN   Acute hepatic encephalopathy (HCC) Resolved --cont lactulose   Back pain Lumbar MRI with DDD but no concern for discitis or osteomyelitis.  No fracture. --tylenol PRN --lidocaine patch   Lactic acidosis Resolved.   Obesity (BMI 30-39.9) Body mass index is 30.57 kg/m.   Physical deconditioning PT/OT --SNF rehab   Hypothyroidism Continue home Synthroid.   Right shoulder pain Chronic.  Unchanged per patient.  Seems to have improved. 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  Leukopenia resolved.  Thrombocytopenia improved and stable around 60's.  Hgb stable around 9-10's.   Portal hypertension (HCC) S/p tips. Doesn't appear volume overloaded    Discharge Diagnoses:  Principal Problem:   Septic shock due to Staphylococcus bacteremia Active Problems:   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   AKI (acute kidney injury) ([facility]  Controlled IDDM-2 with hyperglycemia   Chronic diastolic CHF (congestive heart failure) ([facility]  Hypotension   Fall at home, initial encounter   Portal hypertension ([place])   Pancytopenia ([place])   Elevated liver enzymes   Right shoulder pain   Hypothyroidism   Physical  deconditioning   Obesity (BMI 30-39.9)   Lactic acidosis   Back pain   Hypomagnesemia   Acute hepatic encephalopathy (HCC)   Hyponatremia, hypokalemia and hypomagnesemia   Hyponatremia   Hypokalemia   30 Day Unplanned Readmission Risk Score    Flowsheet Row ED to Hosp-Admission (Current) from 05/14/2022 in [place]  30 Day Unplanned Readmission Risk Score (%) 13.62 Filed at 05/28/2022 0801       This score is the patient's risk of an unplanned readmission within 30 days of being discharged (0 -100%). The score is based on dignosis, age, lab data, medications, orders, and past utilization.   Low:  0-14.9   Medium: 15-21.9   High: 22-29.9   Extreme: 30 and above         Discharge Instructions:  Allergies as of 05/28/2022       Reactions   Lisinopril Hives   Sulfamethoxazole Swelling   Camphor Rash, Swelling   Latex Rash   Nickel Rash        Medication List     STOP taking these medications    ferrous sulfate 325 (65 FE) MG tablet   vitamin E 180 MG (400 UNITS) capsule       TAKE these medications    aspirin EC 81 MG tablet Take 81 mg by mouth daily.   atorvastatin 20 MG tablet Commonly known as: LIPITOR Take 20 mg by mouth at bedtime.   augmented betamethasone dipropionate 0.05 % cream Commonly known as: DIPROLENE-AF Apply 1 application topically 2 (two) times daily.   ceFAZolin  IVPB Commonly known as: ANCEF Inject 2 g into the vein every 8 (eight) hours for 28 days. Indication:  S caprae bacteremia Last Day of Therapy:  06/20/2022 Labs - Once weekly:  CBC/D and CMP, Please pull PIC at completion of IV antibiotics Fax weekly labs to [phone] Method of administration: IV Push Method of administration may be changed at the discretion of home infusion pharmacist based upon assessment of the patient and/or caregiver's ability to self-administer the medication ordered.   Cholecalciferol 50 MCG (2000 UT)  Tabs Take 1 tablet by mouth daily.   famotidine 20 MG tablet Commonly known as: PEPCID Take 20 mg by mouth daily.   Fish Oil 1000 MG Caps Take 1 capsule by mouth 2 (two) times daily.   folic acid 1 MG tablet Commonly known as: FOLVITE Take 1 tablet by mouth daily.   furosemide 20 MG tablet Commonly known as: LASIX Take 1 tablet (20 mg total) by mouth daily.   Glycerin-Hypromellose-PEG 400 0.2-0.2-1 % Soln Apply 1 drop to eye 4 (four) times daily.   insulin aspart 100 UNIT/ML injection Commonly known as: novoLOG Inject 10 Units into the skin 3 (three) times daily before meals. What changed:  how much to take additional instructions   insulin glargine 100 UNIT/ML injection Commonly known as: LANTUS Inject 0.16 mLs (16 Units total) into the skin daily. What changed: how much to take   ketotifen 0.025 % ophthalmic solution Commonly known as: ZADITOR Place 1 drop into both eyes 2 (two) times daily.   lactulose 10 GM/15ML solution Commonly known as: CHRONULAC Take 30 g by mouth daily.   levothyroxine 100 MCG tablet Commonly known as: SYNTHROID Take 100 mcg by  mouth daily before breakfast.   lidocaine 5 % Commonly known as: LIDODERM Place 2 patches onto the skin daily. Remove & Discard patch within 12 hours or as directed by MD   magnesium oxide 400 MG tablet Commonly known as: MAG-OX Take 2 tablets (800 mg total) by mouth 2 (two) times daily. What changed:  medication strength how much to take   Melatonin 10 MG Tabs Take 10 mg by mouth at bedtime.   pantoprazole 40 MG tablet Commonly known as: Protonix Take 1 tablet (40 mg total) by mouth 2 (two) times daily.   RA Probiotic Digestive Care Caps Take 1 capsule by mouth daily.   rifaximin 550 MG Tabs tablet Commonly known as: XIFAXAN Take 550 mg by mouth 2 (two) times daily.   Semaglutide(0.25 or 0.5MG/DOS) 2 MG/1.5ML Sopn Inject 0.5 mg into the skin once a week.   terazosin 5 MG capsule Commonly known  as: HYTRIN Take 5 mg by mouth at bedtime.   trolamine salicylate 10 % cream Commonly known as: ASPERCREME Apply topically as needed for muscle pain.   vitamin C 500 MG tablet Commonly known as: ASCORBIC ACID Take 1,000 mg by mouth daily.               Home Infusion Instuctions  (From admission, onward)           Start     Ordered   05/28/22 0000  Home infusion instructions       Question:  Instructions  Answer:  Flushing of vascular access device: 0.9% NaCl pre/post medication administration and prn patency; Heparin 100 u/ml, 20m for implanted ports and Heparin 10u/ml, 558mfor all other central venous catheters.   05/28/22 0950             Follow-up Information     PaLorelei PontMD Follow up.   Specialty: Internal Medicine Contact information: [phone]               Allergies  Allergen Reactions   Lisinopril Hives   Sulfamethoxazole Swelling   Camphor Rash and Swelling   Latex Rash   Nickel Rash     The results of significant diagnostics from this hospitalization (including imaging, microbiology, ancillary and laboratory) are listed below for reference.   Consultations:   Procedures/Studies: [place] SITE RITE  Result Date: 05/23/2022 If Site Rite image not attached, placement could not be confirmed due to current cardiac rhythm.  ECHO TEE  Result Date: 05/23/2022    TRANSESOPHOGEAL ECHO REPORT   Patient Name:   WECHERRY WITTWERWAN Date of Exam: 05/21/2022 Medical Rec #:  [id number]   Height:       74.0 in Accession #:    [id number]  Weight:       226.2 lb Date of Birth:  2/23-Feb-1946   BSA:          2.290 m Patient Age:    89ears      BP:           109/60 mmHg Patient Gender: M             HR:           57 bpm. Exam Location:  [facility] Procedure: Cardiac Doppler, Color Doppler, Transesophageal Echo and Saline            Contrast Bubble Study Indications:     Not listed on TEE  check-in sheet   History:         Patient has prior history of Echocardiogram examinations, most                  recent 05/17/2022. Signs/Symptoms:Murmur; Risk                  Factors:Hypertension and Diabetes.  Sonographer:     Sherrie Sport Referring Phys:  [id number] CHRISTOPHER RONALD BERGE Diagnosing Phys: Ida Rogue MD PROCEDURE: After discussion of the risks and benefits of a TEE, an informed consent was obtained from the patient. TEE procedure time was 30 minutes. The transesophogeal probe was passed without difficulty through the esophogus of the patient. Imaged were obtained with the patient in a left lateral decubitus position. Local oropharyngeal anesthetic was provided with viscous lidocaine and Benzocaine spray. Sedation performed by performing physician. Patients was under conscious sedation during this procedure. Anesthetic administered: 39mg of Fentanyl, 2.064mof Versed. Image quality was excellent. The patient's vital signs; including heart rate, blood pressure, and oxygen saturation; remained stable throughout the procedure. The patient developed no complications during the procedure. IMPRESSIONS  1. Left ventricular ejection fraction, by estimation, is 60 to 65%. The left ventricle has normal function. The left ventricle has no regional wall motion abnormalities. There is mild left ventricular hypertrophy.  2. Right ventricular systolic function is normal. The right ventricular size is normal.  3. No left atrial/left atrial appendage thrombus was detected.  4. The mitral valve is normal in structure. No evidence of mitral valve regurgitation. No evidence of mitral stenosis.  5. The aortic valve has been repaired/replaced. Aortic valve regurgitation is not visualized. No aortic stenosis is present.  6. There is mild (Grade II) atheroma plaque involving the aortic arch and descending aorta.  7. The inferior vena cava is normal in size with greater than 50% respiratory variability, suggesting right atrial pressure of 3  mmHg. Conclusion(s)/Recommendation(s): Normal biventricular function without evidence of hemodynamically significant valvular heart disease. FINDINGS  Left Ventricle: Left ventricular ejection fraction, by estimation, is 60 to 65%. The left ventricle has normal function. The left ventricle has no regional wall motion abnormalities. The left ventricular internal cavity size was normal in size. There is  mild left ventricular hypertrophy. Right Ventricle: The right ventricular size is normal. No increase in right ventricular wall thickness. Right ventricular systolic function is normal. Left Atrium: Left atrial size was normal in size. No left atrial/left atrial appendage thrombus was detected. Right Atrium: Right atrial size was normal in size. Pericardium: There is no evidence of pericardial effusion. Mitral Valve: The mitral valve is normal in structure. No evidence of mitral valve regurgitation. No evidence of mitral valve stenosis. There is no evidence of mitral valve vegetation. Tricuspid Valve: The tricuspid valve is normal in structure. Tricuspid valve regurgitation is mild . No evidence of tricuspid stenosis. There is no evidence of tricuspid valve vegetation. Aortic Valve: The aortic valve has been repaired/replaced. Aortic valve regurgitation is not visualized. No aortic stenosis is present. There is a TAVR valve present in the aortic position. There is no evidence of aortic valve vegetation. Pulmonic Valve: The pulmonic valve was normal in structure. Pulmonic valve regurgitation is not visualized. No evidence of pulmonic stenosis. There is no evidence of pulmonic valve vegetation. Aorta: The aortic root is normal in size and structure. There is mild (Grade II) atheroma plaque involving the aortic arch and descending aorta. Venous: The inferior vena cava is normal in size with greater  than 50% respiratory variability, suggesting right atrial pressure of 3 mmHg. IAS/Shunts: No atrial level shunt detected by  color flow Doppler. Agitated saline contrast was given intravenously to evaluate for intracardiac shunting. Ida Rogue MD Electronically signed by Ida Rogue MD Signature Date/Time: 05/23/2022/3:21:02 PM    Final    MR LUMBAR SPINE WO CONTRAST  Result Date: 05/17/2022 CLINICAL DATA:  Low back pain and fever. EXAM: MRI LUMBAR SPINE WITHOUT CONTRAST TECHNIQUE: Multiplanar, multisequence MR imaging of the lumbar spine was performed. No intravenous contrast was administered. COMPARISON:  CT abdomen 07/21/2019 FINDINGS: Segmentation: The lowest lumbar type non-rib-bearing vertebra is labeled as L5. Alignment:  No vertebral subluxation is observed. Vertebrae: Marrow heterogeneity is present. Although this can be caused by marrow infiltrative processes, the most common causes include anemia, smoking, obesity, or advancing age. Multilevel degenerative endplate findings in lumbar spine with substantial loss of intervertebral disc height at all levels between L1 and S1. There is some heterogeneity of disc signal without compelling findings of lumbar discitis. Chronic endplate compressions at T12, not changed from 07/21/2019. No findings of endplate osteomyelitis. Small Schmorl's node along the inferior endplate of L2. Conus medullaris and cauda equina: Conus extends to the L1 level. Conus and cauda equina appear normal. Paraspinal and other soft tissues: A fluid signal intensity lesion of the left kidney is partially characterized on today's exam. This is statistically likely to be a cyst but not technically specific. Disc levels: T12-L1: No impingement.  Mild disc bulge. L1-2: Moderate central narrowing of the thecal sac and mild bilateral foraminal stenosis with mild displacement of the right L1 nerve in the lateral extraforaminal space as well as mild bilateral subarticular lateral recess stenosis due to disc osteophyte complex, right inferior foraminal disc protrusion, and degenerative facet arthropathy. L2-3:  Mild to moderate left foraminal stenosis with mild left subarticular lateral recess stenosis and borderline central narrowing of the thecal sac due to disc bulge, intervertebral spurring, and facet arthropathy. L3-4: Prominent central narrowing of the thecal sac with mild bilateral foraminal stenosis and prominent bilateral subarticular lateral recess stenosis due to central disc protrusion, disc bulge, intervertebral spurring, and facet arthropathy. L4-5: Moderate to prominent left and moderate right subarticular lateral recess stenosis and borderline central narrowing of the thecal sac due to disc osteophyte complex, facet arthropathy, and central disc protrusion extending caudad. L5-S1: Mild right foraminal stenosis and borderline bilateral subarticular lateral recess stenosis due to disc osteophyte complex and facet arthropathy. IMPRESSION: 1. No compelling findings of discitis-osteomyelitis at this time. Multilevel degenerative endplate findings are noted. 2. Lumbar spondylosis and degenerative disc disease causing prominent impingement at L3-4; moderate to prominent impingement at L4-5; moderate impingement at L1-2; mild to moderate impingement at L2-3; and mild impingement at L5-S1, as detailed above. 3. Marrow heterogeneity is present. Although this can be caused by marrow infiltrative processes, the most common causes include anemia, smoking, obesity, or advancing age. Electronically Signed   By: Van Clines M.D.   On: 05/17/2022 14:58   ECHOCARDIOGRAM COMPLETE  Result Date: 05/17/2022    ECHOCARDIOGRAM REPORT   Patient Name:   ATHANASIUS KESLING Karg Date of Exam: 05/17/2022 Medical Rec #:  [id number]     Height:       74.0 in Accession #:    [id number]    Weight:       236.1 lb Date of Birth:  1945-12-22     BSA:          2.332 m Patient Age:  76 years      BP:           125/61 mmHg Patient Gender: M             HR:           64 bpm. Exam Location:  Inpatient Procedure: 2D Echo, Color Doppler, Cardiac  Doppler and Intracardiac            Opacification Agent Indications:     Bacteremia R78.81  History:         Patient has prior history of Echocardiogram examinations, most                  recent 10/09/2021. Signs/Symptoms:Murmur; Risk Factors:Sleep                  Apnea, Hypertension and Diabetes. NASH cirrhosis s/p TIPS.                  Aortic Valve: 29 mm Sapien prosthetic, stented (TAVR) valve is                  present in the aortic position. Procedure Date: 09/03/2021.  Sonographer:     Darlina Sicilian RDCS Referring Phys:  [id number] Tsosie Billing Diagnosing Phys: Kathlyn Sacramento MD IMPRESSIONS  1. Left ventricular ejection fraction, by estimation, is 55 to 60%. The left ventricle has normal function. The left ventricle has no regional wall motion abnormalities. Left ventricular diastolic parameters are indeterminate.  2. Right ventricular systolic function is normal. The right ventricular size is normal. There is normal pulmonary artery systolic pressure.  3. Left atrial size was mildly dilated.  4. The mitral valve is normal in structure. No evidence of mitral valve regurgitation. No evidence of mitral stenosis. Moderate mitral annular calcification.  5. The aortic valve is normal in structure. Aortic valve regurgitation is mild. No aortic stenosis is present. There is a 29 mm Sapien prosthetic (TAVR) valve present in the aortic position. Procedure Date: 09/03/2021. Mild perivalvular regurgitation.  6. The inferior vena cava is dilated in size with >50% respiratory variability, suggesting right atrial pressure of 8 mmHg. Conclusion(s)/Recommendation(s): No evidence of valvular vegetations on this transthoracic echocardiogram. Consider a transesophageal echocardiogram to exclude infective endocarditis if clinically indicated. FINDINGS  Left Ventricle: Left ventricular ejection fraction, by estimation, is 55 to 60%. The left ventricle has normal function. The left ventricle has no regional wall motion  abnormalities. Definity contrast agent was given IV to delineate the left ventricular  endocardial borders. The left ventricular internal cavity size was normal in size. There is no left ventricular hypertrophy. Left ventricular diastolic parameters are indeterminate. Right Ventricle: The right ventricular size is normal. No increase in right ventricular wall thickness. Right ventricular systolic function is normal. There is normal pulmonary artery systolic pressure. The tricuspid regurgitant velocity is 2.01 m/s, and  with an assumed right atrial pressure of 8 mmHg, the estimated right ventricular systolic pressure is 23.7 mmHg. Left Atrium: Left atrial size was mildly dilated. Right Atrium: Right atrial size was normal in size. Pericardium: There is no evidence of pericardial effusion. Mitral Valve: The mitral valve is normal in structure. Moderate mitral annular calcification. No evidence of mitral valve regurgitation. No evidence of mitral valve stenosis. Tricuspid Valve: The tricuspid valve is normal in structure. Tricuspid valve regurgitation is mild . No evidence of tricuspid stenosis. Aortic Valve: The aortic valve is normal in structure. Aortic valve regurgitation is mild. No aortic stenosis is present. Aortic valve mean  gradient measures 17.5 mmHg. Aortic valve peak gradient measures 30.0 mmHg. Aortic valve area, by VTI measures 1.57 cm. 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 Procedure Date: 09/03/2021. Pulmonic Valve: The pulmonic valve was normal in structure. Pulmonic valve regurgitation is mild. No evidence of pulmonic stenosis. Aorta: The aortic root is normal in size and structure. Venous: The inferior vena cava is dilated in size with greater than 50% respiratory variability, suggesting right atrial pressure of 8 mmHg. IAS/Shunts: No atrial level shunt detected by color flow Doppler.  LEFT VENTRICLE PLAX 2D LVIDd:         6.25 cm      Diastology  LVIDs:         3.60 cm      LV e' medial:    7.62 cm/s LV PW:         0.80 cm      LV E/e' medial:  16.0 LV IVS:        0.90 cm      LV e' lateral:   6.96 cm/s LVOT diam:     2.50 cm      LV E/e' lateral: 17.5 LV SV:         103 LV SV Index:   44 LVOT Area:     4.91 cm  LV Volumes (MOD) LV vol d, MOD A2C: 157.0 ml LV vol d, MOD A4C: 153.0 ml LV vol s, MOD A2C: 34.7 ml LV vol s, MOD A4C: 41.1 ml LV SV MOD A2C:     122.3 ml LV SV MOD A4C:     153.0 ml LV SV MOD BP:      117.5 ml RIGHT VENTRICLE RV S prime:     13.40 cm/s TAPSE (M-mode): 2.3 cm LEFT ATRIUM             Index LA diam:        4.10 cm 1.76 cm/m LA Vol (A2C):   57.6 ml 24.70 ml/m LA Vol (A4C):   31.3 ml 13.42 ml/m LA Biplane Vol: 43.3 ml 18.57 ml/m  AORTIC VALVE AV Area (Vmax):    1.54 cm AV Area (Vmean):   1.47 cm AV Area (VTI):     1.57 cm AV Vmax:           274.00 cm/s AV Vmean:          199.000 cm/s AV VTI:            0.655 m AV Peak Grad:      30.0 mmHg AV Mean Grad:      17.5 mmHg LVOT Vmax:         85.80 cm/s LVOT Vmean:        59.500 cm/s LVOT VTI:          0.209 m LVOT/AV VTI ratio: 0.32  AORTA Ao Asc diam: 3.50 cm MITRAL VALVE                TRICUSPID VALVE MV Area (PHT): 1.24 cm     TR Peak grad:   16.2 mmHg MV Decel Time: 611 msec     TR Vmax:        201.00 cm/s MV E velocity: 122.00 cm/s MV A velocity: 120.00 cm/s  SHUNTS MV E/A ratio:  1.02         Systemic VTI:  0.21 m  Systemic Diam: 2.50 cm Kathlyn Sacramento MD Electronically signed by Kathlyn Sacramento MD Signature Date/Time: 05/17/2022/1:11:47 PM    Final    CT Angio Chest Pulmonary Embolism (PE) W or WO Contrast  Result Date: 05/14/2022 CLINICAL DATA:  Pulmonary embolus suspected. EXAM: CT ANGIOGRAPHY CHEST WITH CONTRAST TECHNIQUE: Multidetector CT imaging of the chest was performed using the standard protocol during bolus administration of intravenous contrast. Multiplanar CT image reconstructions and MIPs were obtained to evaluate the vascular anatomy.  RADIATION DOSE REDUCTION: This exam was performed according to the departmental dose-optimization program which includes automated exposure control, adjustment of the mA and/or kV according to patient size and/or use of iterative reconstruction technique. CONTRAST:  45m OMNIPAQUE IOHEXOL 350 MG/ML SOLN COMPARISON:  CT chest angio dated April 06, 2022 FINDINGS: Cardiovascular: Somewhat suboptimal contrast opacification of the pulmonary arteries. No evidence of pulmonary embolus. Limited evaluation of the distal lobar, segmental and subsegmental pulmonary arteries due to poor contrast opacification and severe streak artifact. Normal heart size. No pericardial effusion. Prior aortic valve replacement. Mild atherosclerotic disease of the thoracic aorta. Moderate coronary artery calcifications of the LAD. Mitral annular calcifications. Mediastinum/Nodes: Esophagus and thyroid are unremarkable. Pathologically enlarged lymph nodes seen in the chest. Lungs/Pleura: Central airways are patent. Bibasilar atelectasis. No consolidation, pleural effusion or pneumothorax. Upper Abdomen: Cirrhotic liver morphology with tips in place. Splenomegaly. Diverticulosis. Musculoskeletal: No chest wall abnormality. No acute or significant osseous findings. Review of the MIP images confirms the above findings. IMPRESSION: 1. No evidence of central pulmonary embolus. Evaluation of the more distal pulmonary arteries is limited due to poor contrast opacification and severe streak artifact. 2. No acute airspace opacity. 3. Moderate coronary artery calcifications. 4. 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 5. Aortic Atherosclerosis (ICD10-I70.0). Electronically Signed   By: LYetta GlassmanM.D.   On: 05/14/2022 16:49   CT Head Wo Contrast  Result Date: 05/14/2022 CLINICAL DATA:  Altered mental status. EXAM: CT HEAD WITHOUT CONTRAST CT CERVICAL SPINE WITHOUT CONTRAST TECHNIQUE: Multidetector CT  imaging of the head and cervical spine was performed following the standard protocol without intravenous contrast. Multiplanar CT image reconstructions of the cervical spine were also generated. RADIATION DOSE REDUCTION: This exam was performed according to the departmental dose-optimization program which includes automated exposure control, adjustment of the mA and/or kV according to patient size and/or use of iterative reconstruction technique. COMPARISON:  CT head and cervical spine dated January 22, 2019. FINDINGS: CT HEAD FINDINGS Brain: No evidence of acute infarction, hemorrhage, hydrocephalus, extra-axial collection or mass lesion/mass effect. Stable mild atrophy and chronic microvascular ischemic changes. Vascular: No hyperdense vessel or unexpected calcification. Skull: Normal. Negative for fracture or focal lesion. Sinuses/Orbits: No acute finding. Other: None. CT CERVICAL SPINE FINDINGS Alignment: No traumatic malalignment. Skull base and vertebrae: No acute fracture. No primary bone lesion or focal pathologic process. Soft tissues and spinal canal: No prevertebral fluid or swelling. No visible canal hematoma. Disc levels: Similar multilevel disc height loss and bulky endplate spurring. Advanced bilateral facet uncovertebral hypertrophy again noted, with interval facet joint ankylosis on the right C4-C5. Upper chest: Negative. Other: None. IMPRESSION: 1. No acute intracranial abnormality. 2. No acute cervical spine fracture or traumatic listhesis. Electronically Signed   By: WTitus DubinM.D.   On: 05/14/2022 12:25   CT Cervical Spine Wo Contrast  Result Date: 05/14/2022 CLINICAL DATA:  Altered mental status. EXAM: CT HEAD WITHOUT CONTRAST CT CERVICAL SPINE WITHOUT CONTRAST TECHNIQUE: Multidetector CT imaging of the head  and cervical spine was performed following the standard protocol without intravenous contrast. Multiplanar CT image reconstructions of the cervical spine were also generated.  RADIATION DOSE REDUCTION: This exam was performed according to the departmental dose-optimization program which includes automated exposure control, adjustment of the mA and/or kV according to patient size and/or use of iterative reconstruction technique. COMPARISON:  CT head and cervical spine dated January 22, 2019. FINDINGS: CT HEAD FINDINGS Brain: No evidence of acute infarction, hemorrhage, hydrocephalus, extra-axial collection or mass lesion/mass effect. Stable mild atrophy and chronic microvascular ischemic changes. Vascular: No hyperdense vessel or unexpected calcification. Skull: Normal. Negative for fracture or focal lesion. Sinuses/Orbits: No acute finding. Other: None. CT CERVICAL SPINE FINDINGS Alignment: No traumatic malalignment. Skull base and vertebrae: No acute fracture. No primary bone lesion or focal pathologic process. Soft tissues and spinal canal: No prevertebral fluid or swelling. No visible canal hematoma. Disc levels: Similar multilevel disc height loss and bulky endplate spurring. Advanced bilateral facet uncovertebral hypertrophy again noted, with interval facet joint ankylosis on the right C4-C5. Upper chest: Negative. Other: None. IMPRESSION: 1. No acute intracranial abnormality. 2. No acute cervical spine fracture or traumatic listhesis. Electronically Signed   By: Titus Dubin M.D.   On: 05/14/2022 12:25   DG Chest Port 1 View  Result Date: 05/14/2022 CLINICAL DATA:  Questionable sepsis. EXAM: PORTABLE CHEST 1 VIEW COMPARISON:  Chest radiograph dated May 06, 2021 FINDINGS: Heart is normal in size. Prosthetic aortic valve is noted. Low lung volumes without evidence of focal consolidation or pleural effusion. Partially imaged left shoulder arthroplasty. Thoracic spondylosis. IMPRESSION: No acute cardiopulmonary disease. Electronically Signed   By: Keane Police D.O.   On: 05/14/2022 11:25      Labs: BNP (last 3 results) Recent Labs    04/06/22 1122  BNP 8.3   Basic  Metabolic Panel: Recent Labs  Lab 05/22/22 0542 05/23/22 0553 05/24/22 0557 05/25/22 0529 05/26/22 0448  NA 135 138  --   --   --   K 3.7 3.9  --   --   --   CL 104 107  --   --   --   CO2 26 27  --   --   --   GLUCOSE 151* 123*  --   --   --   BUN 13 12  --   --   --   CREATININE 0.79 0.77  --   --   --   CALCIUM 8.6* 8.6*  --   --   --   MG 1.7 1.6* 1.6* 1.6* 1.9   Liver Function Tests: No results for input(s): "AST", "ALT", "ALKPHOS", "BILITOT", "PROT", "ALBUMIN" in the last 168 hours. No results for input(s): "LIPASE", "AMYLASE" in the last 168 hours. No results for input(s): "AMMONIA" in the last 168 hours. CBC: Recent Labs  Lab 05/22/22 0542 05/23/22 0553  WBC 4.6 3.5*  HGB 10.1* 9.8*  HCT 29.4* 28.2*  MCV 92.7 92.5  PLT 65* 64*   Cardiac Enzymes: No results for input(s): "CKTOTAL", "CKMB", "CKMBINDEX", "TROPONINI" in the last 168 hours. BNP: Invalid input(s): "POCBNP" CBG: Recent Labs  Lab 05/27/22 1232 05/27/22 1611 05/27/22 1701 05/27/22 2058 05/28/22 0753  GLUCAP 257* 258* 227* 145* 91   D-Dimer No results for input(s): "DDIMER" in the last 72 hours. Hgb A1c No results for input(s): "HGBA1C" in the last 72 hours. Lipid Profile No results for input(s): "CHOL", "HDL", "LDLCALC", "TRIG", "CHOLHDL", "LDLDIRECT" in the last 72 hours. Thyroid function studies  No results for input(s): "TSH", "T4TOTAL", "T3FREE", "THYROIDAB" in the last 72 hours.  Invalid input(s): "FREET3" Anemia work up No results for input(s): "VITAMINB12", "FOLATE", "FERRITIN", "TIBC", "IRON", "RETICCTPCT" in the last 72 hours. Urinalysis    Component Value Date/Time   COLORURINE YELLOW (A) 05/14/2022 1104   APPEARANCEUR CLEAR (A) 05/14/2022 1104   APPEARANCEUR Hazy 05/01/2014 1653   LABSPEC 1.021 05/14/2022 1104   LABSPEC 1.015 05/01/2014 1653   PHURINE 7.0 05/14/2022 1104   GLUCOSEU NEGATIVE 05/14/2022 1104   GLUCOSEU Negative 05/01/2014 1653   HGBUR NEGATIVE 05/14/2022  1104   BILIRUBINUR NEGATIVE 05/14/2022 1104   BILIRUBINUR Negative 05/01/2014 [place] 05/14/2022 1104   PROTEINUR NEGATIVE 05/14/2022 1104   NITRITE NEGATIVE 05/14/2022 1104   LEUKOCYTESUR NEGATIVE 05/14/2022 1104   LEUKOCYTESUR Negative 05/01/2014 1653   Sepsis Labs Recent Labs  Lab 05/22/22 0542 05/23/22 0553  WBC 4.6 3.5*   Microbiology No results found for this or any previous visit (from the past 240 hour(s)).   Total time spend on discharging this patient, including the last patient exam, discussing the hospital stay, instructions for ongoing care as it relates to all pertinent caregivers, as well as preparing the medical discharge records, prescriptions, and/or referrals as applicable, is 30 minutes.    Enzo Bi, MD  [facility] 05/28/2022, 9:51 AM

## 2022-05-28 NOTE — Progress Notes (Signed)
Loreli Dollar to be D/C'd Rehab per MD order.  Discussed with the patient and all questions fully answered.  VSS, Skin clean, dry and intact without evidence of skin break down, no evidence of skin tears noted. Right PICC site in place and without signs and symptoms of complications.   An After Visit Summary was printed and given to PTAR.   Report called to Reggie, receiving nurse at [facility].  Patient instructed to return to ED, call 911, or call MD for any changes in condition.   Patient escorted via stretcher, and D/C to [facility] via non emergency ambulance.  Manuella Ghazi 05/28/2022 2:34 PM

## 2022-05-28 NOTE — TOC Transition Note (Signed)
Transition of Care [facility]) - CM/SW Discharge Note   Patient Details  Name: Joshua Berry MRN: [id number] Date of Birth: 05-15-1946  [facility] of [facility] CM/SW Contact:  Donnelly Angelica, LCSW Phone Number: 05/28/2022, 1:51 PM  Clinical Narrative:    Pt has orders to discharge today to [facility]. EMS transport paperwork has been completed and transportation has been arranged. No further concerns. CSW signing off.    Final next level of care: Skilled Nursing Facility Barriers to Discharge: Barriers Resolved   Patient Goals and CMS Choice Patient states their goals for this hospitalization and ongoing recovery are:: pt wants to go to rehab. CMS Medicare.gov Compare Post Acute Care list provided to:: Patient Represenative (must comment) Choice offered to / list presented to : Spouse  Discharge Placement              Patient chooses bed at: [facility] Patient to be transferred to facility by: [facility] Name of family member notified: Wife Patient and family notified of of transfer: 05/28/22  Discharge Plan and Services                            [place]: [place] Date [place]: 05/17/22   Representative spoke with at [place]: [place] (formerly Nan[facility] Means [facility])  Social Determinants of Health ([place]) Interventions     Readmission Risk Interventions    05/17/2022    1:45 PM  Readmission Risk Prevention Plan  Transportation Screening Complete  PCP or Specialist Appt within 5-7 Days Complete  Home Care Screening Complete  Medication Review (RN CM) Complete

## 2022-06-06 ENCOUNTER — Encounter: Payer: Self-pay | Admitting: Infectious Diseases

## 2022-06-06 ENCOUNTER — Ambulatory Visit: Payer: No Typology Code available for payment source | Attending: Infectious Diseases | Admitting: Infectious Diseases

## 2022-06-06 VITALS — BP 106/57 | HR 68 | Temp 98.4°F

## 2022-06-06 DIAGNOSIS — E119 Type 2 diabetes mellitus without complications: Secondary | ICD-10-CM | POA: Diagnosis not present

## 2022-06-06 DIAGNOSIS — K7460 Unspecified cirrhosis of liver: Secondary | ICD-10-CM | POA: Diagnosis present

## 2022-06-06 DIAGNOSIS — G4733 Obstructive sleep apnea (adult) (pediatric): Secondary | ICD-10-CM | POA: Diagnosis present

## 2022-06-06 DIAGNOSIS — D649 Anemia, unspecified: Secondary | ICD-10-CM | POA: Diagnosis not present

## 2022-06-06 DIAGNOSIS — G9341 Metabolic encephalopathy: Secondary | ICD-10-CM | POA: Insufficient documentation

## 2022-06-06 DIAGNOSIS — R7881 Bacteremia: Secondary | ICD-10-CM | POA: Insufficient documentation

## 2022-06-06 DIAGNOSIS — K766 Portal hypertension: Secondary | ICD-10-CM | POA: Diagnosis present

## 2022-06-06 DIAGNOSIS — B957 Other staphylococcus as the cause of diseases classified elsewhere: Secondary | ICD-10-CM | POA: Insufficient documentation

## 2022-06-06 DIAGNOSIS — Z952 Presence of prosthetic heart valve: Secondary | ICD-10-CM | POA: Diagnosis not present

## 2022-06-06 DIAGNOSIS — K31819 Angiodysplasia of stomach and duodenum without bleeding: Secondary | ICD-10-CM | POA: Insufficient documentation

## 2022-06-06 DIAGNOSIS — Z792 Long term (current) use of antibiotics: Secondary | ICD-10-CM | POA: Insufficient documentation

## 2022-06-06 NOTE — Patient Instructions (Addendum)
You are here for follow up of for the bacteremia- you are on Iv antibiotics until 06/20/22 - you are at the [facility] If you can do Iv antibiotics at home , then you dont need to stay at the SNF - reached out to Ashley with [facility] to discuss your options- she will be in touch with you

## 2022-06-07 ENCOUNTER — Telehealth: Payer: Self-pay | Admitting: Infectious Diseases

## 2022-06-13 ENCOUNTER — Inpatient Hospital Stay: Payer: No Typology Code available for payment source | Admitting: Infectious Diseases

## 2022-06-17 ENCOUNTER — Other Ambulatory Visit (HOSPITAL_COMMUNITY)
Admission: RE | Admit: 2022-06-17 | Discharge: 2022-06-17 | Disposition: A | Discharge status: Home / Self Care | Payer: Medicare Other | Source: Ambulatory Visit | Attending: Internal Medicine | Admitting: Internal Medicine

## 2022-06-17 ENCOUNTER — Telehealth: Payer: Self-pay

## 2022-06-17 DIAGNOSIS — Z029 Encounter for administrative examinations, unspecified: Secondary | ICD-10-CM | POA: Insufficient documentation

## 2022-06-17 DIAGNOSIS — R6521 Severe sepsis with septic shock: Secondary | ICD-10-CM | POA: Insufficient documentation

## 2022-06-17 DIAGNOSIS — Z452 Encounter for adjustment and management of vascular access device: Secondary | ICD-10-CM | POA: Diagnosis not present

## 2022-06-17 DIAGNOSIS — A411 Sepsis due to other specified staphylococcus: Secondary | ICD-10-CM | POA: Insufficient documentation

## 2022-06-17 LAB — BASIC METABOLIC PANEL
Anion gap: 9 (ref 5–15)
BUN: 13 mg/dL (ref 8–23)
CO2: 24 mmol/L (ref 22–32)
Calcium: 8.6 mg/dL — ABNORMAL LOW (ref 8.9–10.3)
Chloride: 105 mmol/L (ref 98–111)
Creatinine, Ser: 1.19 mg/dL (ref 0.61–1.24)
GFR, Estimated: >60 mL/min (ref >60)
Glucose, Bld: 307 mg/dL — ABNORMAL HIGH (ref 70–99)
Potassium: 3.6 mmol/L (ref 3.5–5.1)
Sodium: 138 mmol/L (ref 135–145)

## 2022-06-17 LAB — CBC WITH DIFFERENTIAL/PLATELET
Abs Immature Granulocytes: 0 10*3/uL (ref 0.00–0.07)
Basophils Absolute: 0 10*3/uL (ref 0.0–0.1)
Basophils Relative: 1 %
Eosinophils Absolute: 0.1 10*3/uL (ref 0.0–0.5)
Eosinophils Relative: 7 %
HCT: 27.8 % — ABNORMAL LOW (ref 39.0–52.0)
Hemoglobin: 9.4 g/dL — ABNORMAL LOW (ref 13.0–17.0)
Immature Granulocytes: 0 %
Lymphocytes Relative: 25 %
Lymphs Abs: 0.5 10*3/uL — ABNORMAL LOW (ref 0.7–4.0)
MCH: 33 pg (ref 26.0–34.0)
MCHC: 33.8 g/dL (ref 30.0–36.0)
MCV: 97.5 fL (ref 80.0–100.0)
Monocytes Absolute: 0.2 10*3/uL (ref 0.1–1.0)
Monocytes Relative: 11 %
Neutro Abs: 1.2 10*3/uL — ABNORMAL LOW (ref 1.7–7.7)
Neutrophils Relative %: 56 %
Platelets: 47 10*3/uL — ABNORMAL LOW (ref 150–400)
RBC: 2.85 MIL/uL — ABNORMAL LOW (ref 4.22–5.81)
RDW: 15.9 % — ABNORMAL HIGH (ref 11.5–15.5)
WBC: 2.1 10*3/uL — ABNORMAL LOW (ref 4.0–10.5)
nRBC: 0 % (ref 0.0–0.2)

## 2022-06-17 NOTE — Telephone Encounter (Signed)
Patient left a voicemail on triage nurse line asking if his PICC line will be removed after last dose of IV abx on 7/6.  Clarified with Dr.Ravishankar -   Verbal orders given to Amy at [facility], per [place] after last dose of IV cefazolin on 7/6. Amy repeated orders back to me and verbalized her understanding.   Patient aware PICC line will be removed after last dose of IV cefazolin.   [place], CMA

## 2022-06-19 NOTE — Telephone Encounter (Signed)
Thank you :)

## 2022-06-24 ENCOUNTER — Telehealth: Payer: Self-pay

## 2022-06-24 NOTE — Telephone Encounter (Signed)
Patient called and stated PICC removed after last dose of antibiotics on 06/20/22. Requested order to have a home health nurse remove dressing at the former PICC site and assess the site.  Spoke with Vicente Males, Patient Care Manager, at [facility]. Vicente Males spoke with patient today as well; stated patient had refused to have [facility] PT/OT remove dressing.   Request routed to Dr. Delaine Lame who gave verbal order for [facility] RN to remove dressing and check the former PICC site. Verbal order communicated to Marylin Crosby, RN at [facility], who stated understanding and provided read back.   Called patient to make him aware [facility] RN would be coming out to assess the site. Also told the patient that, per Dr. Delaine Lame, she wanted follow-up blood cultures 2 weeks post-antibiotics, on 7/20. Informed patient he would need to do a walk-in appointment at Dr. Gwenevere Ghazi office. Patient stated understanding and had no additional questions.  Binnie Kand, RN

## 2022-07-04 ENCOUNTER — Other Ambulatory Visit
Admission: RE | Admit: 2022-07-04 | Discharge: 2022-07-04 | Disposition: A | Discharge status: Home / Self Care | Payer: Medicare Other | Attending: Infectious Diseases | Admitting: Infectious Diseases

## 2022-07-04 ENCOUNTER — Other Ambulatory Visit: Payer: Self-pay | Admitting: Infectious Diseases

## 2022-07-04 ENCOUNTER — Other Ambulatory Visit: Payer: Self-pay

## 2022-07-04 DIAGNOSIS — R7881 Bacteremia: Secondary | ICD-10-CM | POA: Diagnosis present

## 2022-07-04 NOTE — Progress Notes (Signed)
Here for b;lood culture post antibiotic

## 2022-07-09 ENCOUNTER — Telehealth: Payer: Self-pay

## 2022-07-09 LAB — CULTURE, BLOOD (ROUTINE X 2)
Culture: NO GROWTH
Special Requests: ADEQUATE

## 2022-07-09 NOTE — Telephone Encounter (Signed)
-----   Message from Tsosie Billing, MD sent at 07/09/2022 12:15 PM EDT ----- Plwase let him know the blood culture taken 2 weeks after completion of antibiotic is fine- no growth. Thx  ----- Message ----- From: Buel Ream, Lab In [place] Sent: 07/05/2022   5:05 AM EDT To: Tsosie Billing, MD

## 2022-07-31 ENCOUNTER — Emergency Department
Admission: EM | Admit: 2022-07-31 | Discharge: 2022-08-01 | Disposition: A | Discharge status: Home / Self Care | Payer: No Typology Code available for payment source | Attending: Emergency Medicine | Admitting: Emergency Medicine

## 2022-07-31 ENCOUNTER — Emergency Department: Payer: No Typology Code available for payment source

## 2022-07-31 ENCOUNTER — Encounter: Payer: Self-pay | Admitting: Emergency Medicine

## 2022-07-31 DIAGNOSIS — Z7982 Long term (current) use of aspirin: Secondary | ICD-10-CM | POA: Insufficient documentation

## 2022-07-31 DIAGNOSIS — R079 Chest pain, unspecified: Secondary | ICD-10-CM

## 2022-07-31 DIAGNOSIS — Z79899 Other long term (current) drug therapy: Secondary | ICD-10-CM | POA: Diagnosis not present

## 2022-07-31 DIAGNOSIS — Z7984 Long term (current) use of oral hypoglycemic drugs: Secondary | ICD-10-CM | POA: Insufficient documentation

## 2022-07-31 DIAGNOSIS — Z20822 Contact with and (suspected) exposure to covid-19: Secondary | ICD-10-CM | POA: Diagnosis not present

## 2022-07-31 DIAGNOSIS — R0789 Other chest pain: Secondary | ICD-10-CM | POA: Insufficient documentation

## 2022-07-31 DIAGNOSIS — E119 Type 2 diabetes mellitus without complications: Secondary | ICD-10-CM | POA: Insufficient documentation

## 2022-07-31 DIAGNOSIS — C4351 Malignant melanoma of anal skin: Secondary | ICD-10-CM | POA: Insufficient documentation

## 2022-07-31 DIAGNOSIS — I10 Essential (primary) hypertension: Secondary | ICD-10-CM | POA: Diagnosis not present

## 2022-07-31 DIAGNOSIS — Z794 Long term (current) use of insulin: Secondary | ICD-10-CM | POA: Insufficient documentation

## 2022-07-31 LAB — CBC
HCT: 32.4 % — ABNORMAL LOW (ref 39.0–52.0)
Hemoglobin: 11.2 g/dL — ABNORMAL LOW (ref 13.0–17.0)
MCH: 32.1 pg (ref 26.0–34.0)
MCHC: 34.6 g/dL (ref 30.0–36.0)
MCV: 92.8 fL (ref 80.0–100.0)
Platelets: 48 10*3/uL — ABNORMAL LOW (ref 150–400)
RBC: 3.49 MIL/uL — ABNORMAL LOW (ref 4.22–5.81)
RDW: 15.4 % (ref 11.5–15.5)
WBC: 3.4 10*3/uL — ABNORMAL LOW (ref 4.0–10.5)
nRBC: 0 % (ref 0.0–0.2)

## 2022-07-31 NOTE — ED Triage Notes (Signed)
Pt arrived via [facility] from [facility] with c/o substernal chest pressure and weakness x3 days. Pt with EKG changes in route, per EMS with intermittent AFib. No hx/o Afib. Pt has hx/o Thrombocytopenia and Non-alcoholic steatohepatitis. Per pts wife, they recently moved to facility 3 days ago and this started after move.

## 2022-07-31 NOTE — ED Provider Notes (Signed)
[facility] Provider Note    Event Date/Time   First MD Initiated Contact with Patient 07/31/22 2327     (approximate)   History   Chest Pain   HPI  Joshua Berry is a 76 y.o. male with history of hypertension, pancytopenia, diabetes, NASH who presents to the emergency department from his nursing facility with complaints of chest pain that started yesterday and fatigue.  Describes it as a central chest pressure.  Denies associated shortness of breath, nausea, vomiting, diaphoresis, dizziness.  Reports temperature with EMS was 101 but then they rechecked it immediately and it was 98.  He denies any cough, diarrhea, dysuria.  No chills.  He does complain of intermittent abdominal pain for the past 2 weeks mostly in the lower abdomen but none currently.   History provided by patient and EMS.    Past Medical History:  Diagnosis Date   Arthritis    Bleeding ulcer    Cancer ([place])    melanoma, carcinoma   Cirrhosis of liver ([place])    Diabetes mellitus without complication ([place])    Heart murmur    Hypertension    Iron deficiency anemia    NASH (nonalcoholic steatohepatitis)    Sleep apnea    Spleen enlarged    Thrombocytopenia ([place])     Past Surgical History:  Procedure Laterality Date   CHOLECYSTECTOMY     COLONOSCOPY N/A 07/23/2019   Procedure: COLONOSCOPY;  Surgeon: Lin Landsman, MD;  Location: [facility] ENDOSCOPY;  Service: Gastroenterology;  Laterality: N/A;   ESOPHAGOGASTRODUODENOSCOPY N/A 07/20/2019   Procedure: ESOPHAGOGASTRODUODENOSCOPY (EGD);  Surgeon: Lin Landsman, MD;  Location: [facility] ENDOSCOPY;  Service: Gastroenterology;  Laterality: N/A;   ESOPHAGOGASTRODUODENOSCOPY Left 08/20/2019   Procedure: ESOPHAGOGASTRODUODENOSCOPY (EGD);  Surgeon: Virgel Manifold, MD;  Location: [facility] ENDOSCOPY;  Service: Endoscopy;  Laterality: Left;   ESOPHAGOGASTRODUODENOSCOPY (EGD) WITH PROPOFOL N/A 04/25/2021   Procedure: ESOPHAGOGASTRODUODENOSCOPY  (EGD) WITH PROPOFOL;  Surgeon: Lin Landsman, MD;  Location: [facility] ENDOSCOPY;  Service: Gastroenterology;  Laterality: N/A;   SKIN BIOPSY     TEE WITHOUT CARDIOVERSION N/A 12/23/2018   Procedure: TRANSESOPHAGEAL ECHOCARDIOGRAM (TEE);  Surgeon: Teodoro Spray, MD;  Location: [facility] ORS;  Service: Cardiovascular;  Laterality: N/A;   TEE WITHOUT CARDIOVERSION N/A 05/21/2022   Procedure: TRANSESOPHAGEAL ECHOCARDIOGRAM (TEE);  Surgeon: Minna Merritts, MD;  Location: [facility] ORS;  Service: Cardiovascular;  Laterality: N/A;    MEDICATIONS:  Prior to Admission medications   Medication Sig Start Date End Date Taking? Authorizing Provider  aspirin EC 81 MG tablet Take 81 mg by mouth daily.    [provider]  atorvastatin (LIPITOR) 20 MG tablet Take 20 mg by mouth at bedtime.    [provider]  augmented betamethasone dipropionate (DIPROLENE-AF) 0.05 % cream Apply 1 application topically 2 (two) times daily.    [provider]  Cholecalciferol 50 MCG (2000 UT) TABS Take 1 tablet by mouth daily.    [provider]  famotidine (PEPCID) 20 MG tablet Take 20 mg by mouth daily.    [provider]  folic acid (FOLVITE) 1 MG tablet Take 1 tablet by mouth daily. 12/03/19   [provider]  furosemide (LASIX) 20 MG tablet Take 1 tablet (20 mg total) by mouth daily. 04/07/22   Lorella Nimrod, MD  Glycerin-Hypromellose-PEG 400 0.2-0.2-1 % SOLN Apply 1 drop to eye 4 (four) times daily.    [provider]  insulin aspart (NOVOLOG) 100 UNIT/ML injection Inject 10 Units  into the skin 3 (three) times daily before meals. Patient taking differently: Inject 16-22 Units into the skin 3 (three) times daily before meals. (Based on blood glucose reading) 04/26/21   Nita Sells, MD  insulin glargine (LANTUS) 100 UNIT/ML injection Inject 0.16 mLs (16 Units total) into the skin daily. Patient taking differently: Inject 14 Units into the skin daily. 04/26/21    Nita Sells, MD  ketotifen (ZADITOR) 0.025 % ophthalmic solution Place 1 drop into both eyes 2 (two) times daily.    [provider]  Lactobacillus Rhamnosus, GG, (RA PROBIOTIC DIGESTIVE CARE) CAPS Take 1 capsule by mouth daily.    [provider]  lactulose (CHRONULAC) 10 GM/15ML solution Take 30 g by mouth daily.    [provider]  levothyroxine (SYNTHROID) 100 MCG tablet Take 100 mcg by mouth daily before breakfast.    [provider]  lidocaine (LIDODERM) 5 % Place 2 patches onto the skin daily. Remove & Discard patch within 12 hours or as directed by MD 05/28/22   Enzo Bi, MD  magnesium oxide (MAG-OX) 400 MG tablet Take 2 tablets (800 mg total) by mouth 2 (two) times daily. 05/28/22   Enzo Bi, MD  Melatonin 10 MG TABS Take 10 mg by mouth at bedtime.    [provider]  Omega-3 Fatty Acids (FISH OIL) 1000 MG CAPS Take 1 capsule by mouth 2 (two) times daily.    [provider]  pantoprazole (PROTONIX) 40 MG tablet Take 1 tablet (40 mg total) by mouth 2 (two) times daily. 04/07/22 06/06/22  Lorella Nimrod, MD  rifaximin (XIFAXAN) 550 MG TABS tablet Take 550 mg by mouth 2 (two) times daily.    [provider]  Semaglutide,0.25 or 0.5MG/DOS, 2 MG/1.5ML SOPN Inject 0.5 mg into the skin once a week.    [provider]  terazosin (HYTRIN) 5 MG capsule Take 5 mg by mouth at bedtime.    [provider]  trolamine salicylate (ASPERCREME) 10 % cream Apply topically as needed for muscle pain. 05/28/22   Enzo Bi, MD  vitamin C (ASCORBIC ACID) 500 MG tablet Take 1,000 mg by mouth daily.    [provider]    Physical Exam   Triage Vital Signs: ED Triage Vitals  Enc Vitals Group     BP 07/31/22 2327 131/71     Pulse Rate 07/31/22 2325 71     Resp 07/31/22 2325 16     Temp 07/31/22 2327 98.1 F (36.7 C)     Temp src --      SpO2 07/31/22 2327 99 %     Weight --      Height --      Head Circumference  --      Peak Flow --      Pain Score --      Pain Loc --      Pain Edu? --      Excl. in [place]? --     Most recent vital signs: Vitals:   08/01/22 0200 08/01/22 0230  BP: (!) 148/81 131/86  Pulse: 62 65  Resp: 13 16  Temp:  98.1 F (36.7 C)  SpO2: 99% 98%    CONSTITUTIONAL: Alert and oriented and responds appropriately to questions. Well-appearing; well-nourished HEAD: Normocephalic, atraumatic EYES: Conjunctivae clear, pupils appear equal, sclera nonicteric ENT: normal nose; moist mucous membranes NECK: Supple, normal ROM CARD: RRR; S1 and S2 appreciated; + murmur, no clicks, no rubs, no gallops RESP: Normal chest excursion  without splinting or tachypnea; breath sounds clear and equal bilaterally; no wheezes, no rhonchi, no rales, no hypoxia or respiratory distress, speaking full sentences ABD/GI: Normal bowel sounds; non-distended; soft, non-tender, no rebound, no guarding, no peritoneal signs BACK: The back appears normal EXT: Normal ROM in all joints; no deformity noted, no edema; no cyanosis, no calf tenderness or calf swelling SKIN: Normal color for age and race; warm; no rash on exposed skin NEURO: Moves all extremities equally, normal speech PSYCH: The patient's mood and manner are appropriate.   ED Results / Procedures / Treatments   LABS: (all labs ordered are listed, but only abnormal results are displayed) Labs Reviewed  BASIC METABOLIC PANEL - Abnormal; Notable for the following components:      Result Value   Glucose, Bld 181 (*)    All other components within normal limits  CBC - Abnormal; Notable for the following components:   WBC 3.4 (*)    RBC 3.49 (*)    Hemoglobin 11.2 (*)    HCT 32.4 (*)    Platelets 48 (*)    All other components within normal limits  LIPASE, BLOOD - Abnormal; Notable for the following components:   Lipase 79 (*)    All other components within normal limits  HEPATIC FUNCTION PANEL - Abnormal; Notable for the following  components:   Albumin 3.4 (*)    AST 46 (*)    Total Bilirubin 1.9 (*)    Bilirubin, Direct 0.4 (*)    Indirect Bilirubin 1.5 (*)    All other components within normal limits  URINALYSIS, ROUTINE W REFLEX MICROSCOPIC - Abnormal; Notable for the following components:   Color, Urine YELLOW (*)    APPearance CLEAR (*)    All other components within normal limits  SARS CORONAVIRUS 2 BY RT PCR  MAGNESIUM  TROPONIN I (HIGH SENSITIVITY)  TROPONIN I (HIGH SENSITIVITY)     EKG:  EKG Interpretation  Date/Time:  Wednesday July 31 2022 23:26:10 EDT Ventricular Rate:  67 PR Interval:  229 QRS Duration: 107 QT Interval:  476 QTC Calculation: 503 R Axis:   -45 Text Interpretation: Sinus rhythm Prolonged PR interval LAD, consider left anterior fascicular block Prolonged QT interval Baseline wander in lead(s) I III aVL No significant change since last tracing Confirmed by Pryor Curia [id number]) on 07/31/2022 11:30:56 PM         RADIOLOGY: My personal review and interpretation of imaging: Chest x-ray clear.  I have personally reviewed all radiology reports.   DG Chest Port 1 View  Result Date: 07/31/2022 CLINICAL DATA:  Substernal chest pressure and weakness. EXAM: PORTABLE CHEST 1 VIEW COMPARISON:  May 14, 2022 FINDINGS: The heart size and mediastinal contours are within normal limits. 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 Mildly decreased lung volumes are seen. Both lungs are clear. A left shoulder prosthesis is noted. The visualized skeletal structures are unremarkable. IMPRESSION: No active disease. Electronically Signed   By: Virgina Norfolk M.D.   On: 07/31/2022 23:40     PROCEDURES:  Critical Care performed: No     .1-3 Lead EKG Interpretation  Performed by: Anjelika Ausburn, Delice Bison, DO Authorized by: Chaunice Obie, Delice Bison, DO     Interpretation: normal     ECG rate:  65   ECG rate assessment: normal     Rhythm: sinus rhythm     Ectopy: none     Conduction: normal        IMPRESSION / MDM / ASSESSMENT AND PLAN /  ED COURSE  I reviewed the triage vital signs and the nursing notes.    Patient here with complaints of chest pain and fatigue.  The patient is on the cardiac monitor to evaluate for evidence of arrhythmia and/or significant heart rate changes.   DIFFERENTIAL DIAGNOSIS (includes but not limited to):   ACS, PE, dissection, pneumonia, pneumothorax, CHF, UTI   Patient's presentation is most consistent with acute presentation with potential threat to life or bodily function.   PLAN: Patient reports he had a fever of 101 with EMS but immediately it was rechecked it was 98.  He does not feel warm to the touch here and did not receive any antipyretics.  He has no infectious symptoms.  Will obtain CBC, CMP, lipase, urinalysis, chest x-ray, COVID swab, troponin x2.   MEDICATIONS GIVEN IN ED: Medications - No data to display   ED COURSE: Patient's labs show pancytopenia which is chronic compared to previous.  Normal electrolytes.  Troponin x2 negative.  Urine shows no sign of infection or dehydration.  COVID-negative.  Lipase and LFTs minimally elevated but this appears chronic for patient and he is not having any upper abdominal pain.  Abdominal exam today is benign.  Chest x-ray reviewed and interpreted by myself and the radiologist and shows no acute abnormality.  Patient states he is now pain-free.  No events noted on cardiac monitoring.  Continues to be hemodynamically stable.  Discussed possibility of admission versus close outpatient follow-up.  Patient would prefer outpatient follow-up and states he can see his cardiologist at the [facility] in the next 1 to 2 weeks.  Have offered to place referral to cardiology here in [place] which she declines.  Discussed return precautions.  Will discharge back to his nursing home when he has a ride available in the morning.   CONSULTS:  Admission considered but patient is feeling better with reassuring work-up  here and states he would prefer further outpatient management.   OUTSIDE RECORDS REVIEWED: Reviewed patient's recent notes in August 2023 from the [place].       FINAL CLINICAL IMPRESSION(S) / ED DIAGNOSES   Final diagnoses:  Nonspecific chest pain     Rx / DC Orders   ED Discharge Orders     None        Note:  This document was prepared using Dragon voice recognition software and may include unintentional dictation errors.   Brysun Eschmann, Delice Bison, DO 08/01/22 [phone]

## 2022-08-01 LAB — URINALYSIS, ROUTINE W REFLEX MICROSCOPIC
Bilirubin Urine: NEGATIVE
Glucose, UA: NEGATIVE mg/dL
Hgb urine dipstick: NEGATIVE
Ketones, ur: NEGATIVE mg/dL
Leukocytes,Ua: NEGATIVE
Nitrite: NEGATIVE
Protein, ur: NEGATIVE mg/dL
Specific Gravity, Urine: 1.014 (ref 1.005–1.030)
pH: 7 (ref 5.0–8.0)

## 2022-08-01 LAB — BASIC METABOLIC PANEL
Anion gap: 7 (ref 5–15)
BUN: 19 mg/dL (ref 8–23)
CO2: 24 mmol/L (ref 22–32)
Calcium: 9.1 mg/dL (ref 8.9–10.3)
Chloride: 107 mmol/L (ref 98–111)
Creatinine, Ser: 1.22 mg/dL (ref 0.61–1.24)
GFR, Estimated: >60 mL/min (ref >60)
Glucose, Bld: 181 mg/dL — ABNORMAL HIGH (ref 70–99)
Potassium: 4 mmol/L (ref 3.5–5.1)
Sodium: 138 mmol/L (ref 135–145)

## 2022-08-01 LAB — HEPATIC FUNCTION PANEL
ALT: 31 U/L (ref 0–44)
AST: 46 U/L — ABNORMAL HIGH (ref 15–41)
Albumin: 3.4 g/dL — ABNORMAL LOW (ref 3.5–5.0)
Alkaline Phosphatase: 102 U/L (ref 38–126)
Bilirubin, Direct: 0.4 mg/dL — ABNORMAL HIGH (ref 0.0–0.2)
Indirect Bilirubin: 1.5 mg/dL — ABNORMAL HIGH (ref 0.3–0.9)
Total Bilirubin: 1.9 mg/dL — ABNORMAL HIGH (ref 0.3–1.2)
Total Protein: 7.2 g/dL (ref 6.5–8.1)

## 2022-08-01 LAB — TROPONIN I (HIGH SENSITIVITY)
Troponin I (High Sensitivity): 11 ng/L (ref <18)
Troponin I (High Sensitivity): 9 ng/L (ref <18)

## 2022-08-01 LAB — SARS CORONAVIRUS 2 BY RT PCR: SARS Coronavirus 2 by RT PCR: NEGATIVE

## 2022-08-01 LAB — LIPASE, BLOOD: Lipase: 79 U/L — ABNORMAL HIGH (ref 11–51)

## 2022-08-01 LAB — MAGNESIUM: Magnesium: 1.7 mg/dL (ref 1.7–2.4)

## 2022-08-01 NOTE — ED Notes (Signed)
Called [facility] spoke to [place] waiting for transporter to come in at 64

## 2022-08-01 NOTE — Discharge Instructions (Signed)
Please call your cardiologist for close outpatient follow-up.

## 2022-08-01 NOTE — ED Notes (Signed)
Pt contacted his Caregiver and she came to get him.  Report given to Caregiver.

## 2022-08-01 NOTE — ED Notes (Addendum)
Joshua Berry is reporting transport does not arrive until 0900 and they will send someone at that time. Pt is anxious to get back.

## 2022-08-01 NOTE — ED Notes (Signed)
Pt noted to be sleeping soundly.  Respirations are even and unlabored.  NS will attempt to call [facility] around 0800 for transport.

## 2022-08-01 NOTE — ED Notes (Signed)
[facility] contacted and confirmed that [facility] has a transportation service but state they don't know when it begins. Attempted to contact [facility] twice with no answer.

## 2022-08-01 NOTE — ED Notes (Signed)
Called [facility] and spoke to [facility]  she will call back with information about transport back to facility [phone]

## 2022-08-01 NOTE — ED Notes (Signed)
Patient unable to provide urine specimen at this time. Ice water given to patient per request.

## 2022-08-02 ENCOUNTER — Other Ambulatory Visit: Payer: Self-pay

## 2022-08-02 ENCOUNTER — Inpatient Hospital Stay
Admission: EM | Admit: 2022-08-02 | Discharge: 2022-08-05 | DRG: 442 | Disposition: A | Discharge status: Home Health | Payer: No Typology Code available for payment source | Attending: Internal Medicine | Admitting: Internal Medicine

## 2022-08-02 ENCOUNTER — Emergency Department: Payer: No Typology Code available for payment source

## 2022-08-02 ENCOUNTER — Encounter: Payer: Self-pay | Admitting: Emergency Medicine

## 2022-08-02 DIAGNOSIS — K7682 Hepatic encephalopathy: Principal | ICD-10-CM | POA: Diagnosis present

## 2022-08-02 DIAGNOSIS — Z87891 Personal history of nicotine dependence: Secondary | ICD-10-CM

## 2022-08-02 DIAGNOSIS — Z9049 Acquired absence of other specified parts of digestive tract: Secondary | ICD-10-CM

## 2022-08-02 DIAGNOSIS — Z79890 Hormone replacement therapy (postmenopausal): Secondary | ICD-10-CM

## 2022-08-02 DIAGNOSIS — M1990 Unspecified osteoarthritis, unspecified site: Secondary | ICD-10-CM | POA: Diagnosis present

## 2022-08-02 DIAGNOSIS — E1165 Type 2 diabetes mellitus with hyperglycemia: Secondary | ICD-10-CM | POA: Diagnosis present

## 2022-08-02 DIAGNOSIS — E039 Hypothyroidism, unspecified: Secondary | ICD-10-CM | POA: Diagnosis present

## 2022-08-02 DIAGNOSIS — D696 Thrombocytopenia, unspecified: Secondary | ICD-10-CM | POA: Diagnosis present

## 2022-08-02 DIAGNOSIS — Z91040 Latex allergy status: Secondary | ICD-10-CM

## 2022-08-02 DIAGNOSIS — Z888 Allergy status to other drugs, medicaments and biological substances status: Secondary | ICD-10-CM

## 2022-08-02 DIAGNOSIS — G4733 Obstructive sleep apnea (adult) (pediatric): Secondary | ICD-10-CM | POA: Diagnosis present

## 2022-08-02 DIAGNOSIS — Z882 Allergy status to sulfonamides status: Secondary | ICD-10-CM

## 2022-08-02 DIAGNOSIS — Z85820 Personal history of malignant melanoma of skin: Secondary | ICD-10-CM

## 2022-08-02 DIAGNOSIS — K7581 Nonalcoholic steatohepatitis (NASH): Secondary | ICD-10-CM | POA: Diagnosis present

## 2022-08-02 DIAGNOSIS — E663 Overweight: Secondary | ICD-10-CM | POA: Diagnosis present

## 2022-08-02 DIAGNOSIS — K7460 Unspecified cirrhosis of liver: Secondary | ICD-10-CM | POA: Diagnosis present

## 2022-08-02 DIAGNOSIS — Z7982 Long term (current) use of aspirin: Secondary | ICD-10-CM

## 2022-08-02 DIAGNOSIS — Z8249 Family history of ischemic heart disease and other diseases of the circulatory system: Secondary | ICD-10-CM

## 2022-08-02 DIAGNOSIS — Z794 Long term (current) use of insulin: Secondary | ICD-10-CM

## 2022-08-02 DIAGNOSIS — Z79899 Other long term (current) drug therapy: Secondary | ICD-10-CM

## 2022-08-02 DIAGNOSIS — I110 Hypertensive heart disease with heart failure: Secondary | ICD-10-CM | POA: Diagnosis present

## 2022-08-02 DIAGNOSIS — Z20822 Contact with and (suspected) exposure to covid-19: Secondary | ICD-10-CM | POA: Diagnosis present

## 2022-08-02 DIAGNOSIS — I5032 Chronic diastolic (congestive) heart failure: Secondary | ICD-10-CM | POA: Diagnosis present

## 2022-08-02 LAB — COMPREHENSIVE METABOLIC PANEL
ALT: 32 U/L (ref 0–44)
AST: 50 U/L — ABNORMAL HIGH (ref 15–41)
Albumin: 3.8 g/dL (ref 3.5–5.0)
Alkaline Phosphatase: 104 U/L (ref 38–126)
Anion gap: 11 (ref 5–15)
BUN: 16 mg/dL (ref 8–23)
CO2: 20 mmol/L — ABNORMAL LOW (ref 22–32)
Calcium: 9.7 mg/dL (ref 8.9–10.3)
Chloride: 107 mmol/L (ref 98–111)
Creatinine, Ser: 1.16 mg/dL (ref 0.61–1.24)
GFR, Estimated: >60 mL/min (ref >60)
Glucose, Bld: 167 mg/dL — ABNORMAL HIGH (ref 70–99)
Potassium: 3.5 mmol/L (ref 3.5–5.1)
Sodium: 138 mmol/L (ref 135–145)
Total Bilirubin: 2.8 mg/dL — ABNORMAL HIGH (ref 0.3–1.2)
Total Protein: 8 g/dL (ref 6.5–8.1)

## 2022-08-02 LAB — CBC
HCT: 37.4 % — ABNORMAL LOW (ref 39.0–52.0)
Hemoglobin: 12.8 g/dL — ABNORMAL LOW (ref 13.0–17.0)
MCH: 31.9 pg (ref 26.0–34.0)
MCHC: 34.2 g/dL (ref 30.0–36.0)
MCV: 93.3 fL (ref 80.0–100.0)
Platelets: 71 10*3/uL — ABNORMAL LOW (ref 150–400)
RBC: 4.01 MIL/uL — ABNORMAL LOW (ref 4.22–5.81)
RDW: 15.3 % (ref 11.5–15.5)
WBC: 5.1 10*3/uL (ref 4.0–10.5)
nRBC: 0 % (ref 0.0–0.2)

## 2022-08-02 LAB — AMMONIA: Ammonia: 63 umol/L — ABNORMAL HIGH (ref 9–35)

## 2022-08-02 LAB — CBG MONITORING, ED: Glucose-Capillary: 146 mg/dL — ABNORMAL HIGH (ref 70–99)

## 2022-08-02 MED ORDER — LACTULOSE 10 GM/15ML PO SOLN
30.0000 g | Freq: Three times a day (TID) | ORAL | Status: DC
Start: 2022-08-03 — End: 2022-08-05
  Administered 2022-08-03 – 2022-08-05 (×5): 30 g via ORAL
  Filled 2022-08-02 (×5): qty 60

## 2022-08-02 MED ORDER — SODIUM CHLORIDE 0.45 % IV SOLN: INTRAVENOUS | Status: DC

## 2022-08-02 MED ORDER — INSULIN ASPART 100 UNIT/ML IJ SOLN
0.0000 [IU] | Freq: Three times a day (TID) | INTRAMUSCULAR | Status: DC
  Administered 2022-08-03: 3 [IU] via SUBCUTANEOUS
  Administered 2022-08-03 (×2): 2 [IU] via SUBCUTANEOUS
  Administered 2022-08-04 (×3): 3 [IU] via SUBCUTANEOUS
  Administered 2022-08-05: 8 [IU] via SUBCUTANEOUS
  Administered 2022-08-05: 2 [IU] via SUBCUTANEOUS
  Filled 2022-08-02 (×7): qty 1

## 2022-08-02 MED ORDER — INSULIN ASPART 100 UNIT/ML IJ SOLN
0.0000 [IU] | Freq: Every day | INTRAMUSCULAR | Status: DC
  Filled 2022-08-02: qty 1

## 2022-08-02 MED ORDER — LACTULOSE 10 GM/15ML PO SOLN: 30.0000 g | Freq: Three times a day (TID) | ORAL | Status: DC

## 2022-08-02 MED ORDER — RIFAXIMIN 550 MG PO TABS
550.0000 mg | ORAL_TABLET | Freq: Two times a day (BID) | ORAL | Status: DC
  Administered 2022-08-02 – 2022-08-03 (×2): 550 mg via ORAL
  Filled 2022-08-02 (×2): qty 1

## 2022-08-02 MED ORDER — MORPHINE SULFATE (PF) 2 MG/ML IV SOLN
2.0000 mg | INTRAVENOUS | Status: DC | PRN
  Administered 2022-08-03 (×3): 2 mg via INTRAVENOUS
  Filled 2022-08-02 (×3): qty 1

## 2022-08-02 MED ORDER — LACTULOSE 10 GM/15ML PO SOLN
30.0000 g | Freq: Once | ORAL | Status: AC
  Administered 2022-08-02: 30 g via ORAL
  Filled 2022-08-02: qty 60

## 2022-08-02 NOTE — ED Triage Notes (Signed)
Patients daughter called from [place] stating patient has been having fever, resting in bed mostly yesterday and today. States patients wife reported patient to be a little confused and having trouble finding his words. Daughter states she saw patient on Tuesday and seemed his normal self. Patient c/o back and shoulder pain from an injury 4 days ago. Patient slow to respond for some questions but speaking in full sentences.

## 2022-08-02 NOTE — ED Notes (Signed)
Daughter called  if need any information please call  she is out of town  Marlane Hatcher [phone]

## 2022-08-02 NOTE — ED Provider Notes (Signed)
[facility] Provider Note    Event Date/Time   First MD Initiated Contact with Patient 08/02/22 2130     (approximate)   History   Altered Mental Status   HPI  Joshua Berry is a 76 y.o. male with a history of [name] cirrhosis presents to the ER for worsening weakness confusion and forgetfulness over the past 24 to 48 hours.  Was seen recently for possible fever had septic work-up which is negative was discharged home.  Failure reports that today he has been very sluggish slow to respond and acting like his ammonia levels are elevated.  Reportedly has not had normal bowel movement in the past 24 to 48 hours questioning whether he has been taking his lactulose.  They did just recently moved from independent to assisted living facility.  No report of any fevers today.  No nausea or vomiting.     Physical Exam   Triage Vital Signs: ED Triage Vitals  Enc Vitals Group     BP 08/02/22 1813 (!) 147/78     Pulse Rate 08/02/22 1813 71     Resp 08/02/22 1813 16     Temp 08/02/22 1813 98.1 F (36.7 C)     Temp Source 08/02/22 1813 Oral     SpO2 08/02/22 1813 99 %     Weight 08/02/22 1817 232 lb (105.2 kg)     Height 08/02/22 1817 6' 2"  (1.88 m)     Head Circumference --      Peak Flow --      Pain Score 08/02/22 1817 4     Pain Loc --      Pain Edu? --      Excl. in [place]? --     Most recent vital signs: Vitals:   08/02/22 2100 08/02/22 2130  BP: (!) 147/73 (!) 156/74  Pulse: (!) 58 64  Resp: 12 13  Temp:    SpO2: 100% 98%     Constitutional: Alert  Eyes: Conjunctivae are normal.  Head: Atraumatic. Nose: No congestion/rhinnorhea. Mouth/Throat: Mucous membranes are moist.   Neck: Painless ROM.  Cardiovascular:   Good peripheral circulation. Respiratory: Normal respiratory effort.  No retractions.  Gastrointestinal: Soft and nontender.  Musculoskeletal:  no deformity Neurologic:  MAE spontaneously. No gross focal neurologic deficits are  appreciated. + asterixis Skin:  Skin is warm, dry and intact. No rash noted. Psychiatric: Mood and affect are normal. Speech and behavior are normal.    ED Results / Procedures / Treatments   Labs (all labs ordered are listed, but only abnormal results are displayed) Labs Reviewed  COMPREHENSIVE METABOLIC PANEL - Abnormal; Notable for the following components:      Result Value   CO2 20 (*)    Glucose, Bld 167 (*)    AST 50 (*)    Total Bilirubin 2.8 (*)    All other components within normal limits  CBC - Abnormal; Notable for the following components:   RBC 4.01 (*)    Hemoglobin 12.8 (*)    HCT 37.4 (*)    Platelets 71 (*)    All other components within normal limits  AMMONIA - Abnormal; Notable for the following components:   Ammonia 63 (*)    All other components within normal limits     EKG  ED ECG REPORT I, Merlyn Lot, the attending physician, personally viewed and interpreted this ECG.   Date: 08/02/2022  EKG Time: 18:23  Rate: 75  Rhythm: sinus with  pac  Axis: left  Intervals:normalqt  ST&T Change: no stemi, nonospecific st abn    RADIOLOGY Please see ED Course for my review and interpretation.  I personally reviewed all radiographic images ordered to evaluate for the above acute complaints and reviewed radiology reports and findings.  These findings were personally discussed with the patient.  Please see medical record for radiology report.    PROCEDURES:  Critical Care performed:   Procedures   MEDICATIONS ORDERED IN ED: Medications  lactulose (CHRONULAC) 10 GM/15ML solution 30 g (has no administration in time range)     IMPRESSION / MDM / ASSESSMENT AND PLAN / ED COURSE  I reviewed the triage vital signs and the nursing notes.                              Differential diagnosis includes, but is not limited to, hepatic encephalopathy dehydration, electrolyte abnormality, sepsis, CVA,  Presented to the ER for evaluation of  symptoms as described above.  This presenting complaint could reflect a potentially life-threatening illness therefore the patient will be placed on continuous pulse oximetry and telemetry for monitoring.  Laboratory evaluation will be sent to evaluate for the above complaints.  Blood work does show evidence of rising ammonia.  Clinically has symptoms consistent with hepatic encephalopathy.  CT imaging on my review and interpretation does not show any evidence of bleed.  Seems less consistent with CVA.  Does not meet septic criteria.  Will order lactulose.  Given his worsening confusion despite outpatient management I do feel he will require observation in the hospital for treatment of acute hepatic encephalopathy.  I have consulted hospitalist for further evaluation management.     FINAL CLINICAL IMPRESSION(S) / ED DIAGNOSES   Final diagnoses:  Hepatic encephalopathy ([place])     Rx / DC Orders   ED Discharge Orders     None        Note:  This document was prepared using Dragon voice recognition software and may include unintentional dictation errors.    Merlyn Lot, MD 08/02/22 2158

## 2022-08-02 NOTE — H&P (Signed)
History and Physical    [name] [id number]:[id number] DOB: [birth year] DOA: 08/02/2022 DOS: the patient was seen and examined on 08/02/2022 PCP: [facility]  Patient coming from: {Point_of_Origin:[address]}  Chief Complaint:  Chief Complaint  Patient presents with   Altered Mental Status   HPI: JOESEPH VERVILLE is a 76 y.o. male with medical history significant of ***  Review of Systems: {ROS_Text:26778} Past Medical History:  Diagnosis Date   Arthritis    Bleeding ulcer    Cancer ([place])    melanoma, carcinoma   Cirrhosis of liver ([place])    Diabetes mellitus without complication ([place])    Heart murmur    Hypertension    Iron deficiency anemia    NASH (nonalcoholic steatohepatitis)    Sleep apnea    Spleen enlarged    Thrombocytopenia ([place])    Past Surgical History:  Procedure Laterality Date   CHOLECYSTECTOMY     COLONOSCOPY N/A 07/23/2019   Procedure: COLONOSCOPY;  Surgeon: Lin Landsman, MD;  Location: [facility] ENDOSCOPY;  Service: Gastroenterology;  Laterality: N/A;   ESOPHAGOGASTRODUODENOSCOPY N/A 07/20/2019   Procedure: ESOPHAGOGASTRODUODENOSCOPY (EGD);  Surgeon: Lin Landsman, MD;  Location: [facility] ENDOSCOPY;  Service: Gastroenterology;  Laterality: N/A;   ESOPHAGOGASTRODUODENOSCOPY Left 08/20/2019   Procedure: ESOPHAGOGASTRODUODENOSCOPY (EGD);  Surgeon: Virgel Manifold, MD;  Location: [facility] ENDOSCOPY;  Service: Endoscopy;  Laterality: Left;   ESOPHAGOGASTRODUODENOSCOPY (EGD) WITH PROPOFOL N/A 04/25/2021   Procedure: ESOPHAGOGASTRODUODENOSCOPY (EGD) WITH PROPOFOL;  Surgeon: Lin Landsman, MD;  Location: [facility] ENDOSCOPY;  Service: Gastroenterology;  Laterality: N/A;   SKIN BIOPSY     TEE WITHOUT CARDIOVERSION N/A 12/23/2018   Procedure: TRANSESOPHAGEAL ECHOCARDIOGRAM (TEE);  Surgeon: Teodoro Spray, MD;  Location: [facility] ORS;  Service: Cardiovascular;  Laterality: N/A;   TEE WITHOUT CARDIOVERSION N/A 05/21/2022   Procedure: TRANSESOPHAGEAL ECHOCARDIOGRAM  (TEE);  Surgeon: Minna Merritts, MD;  Location: [facility] ORS;  Service: Cardiovascular;  Laterality: N/A;   Social History:  reports that he quit smoking about 53 years ago. His smoking use included cigarettes. He has a 2.50 pack-year smoking history. He has never used smokeless tobacco. He reports that he does not currently use alcohol. He reports that he does not currently use drugs.  Allergies  Allergen Reactions   Lisinopril Hives   Sulfamethoxazole Swelling   Camphor Rash and Swelling   Latex Rash   Nickel Rash    Family History  Problem Relation Age of Onset   Heart attack Brother     Prior to Admission medications   Medication Sig Start Date End Date Taking? Authorizing Provider  aspirin EC 81 MG tablet Take 81 mg by mouth daily.    [provider]  atorvastatin (LIPITOR) 20 MG tablet Take 20 mg by mouth at bedtime.    [provider]  augmented betamethasone dipropionate (DIPROLENE-AF) 0.05 % cream Apply 1 application topically 2 (two) times daily.    [provider]  Cholecalciferol 50 MCG (2000 UT) TABS Take 1 tablet by mouth daily.    [provider]  famotidine (PEPCID) 20 MG tablet Take 20 mg by mouth daily.    [provider]  folic acid (FOLVITE) 1 MG tablet Take 1 tablet by mouth daily. 12/03/19   [provider]  furosemide (LASIX) 20 MG tablet Take 1 tablet (20 mg total) by mouth daily. 04/07/22   Lorella Nimrod, MD  Glycerin-Hypromellose-PEG 400 0.2-0.2-1 % SOLN Apply 1 drop to eye 4 (four) times daily.    [provider]  insulin aspart (NOVOLOG) 100 UNIT/ML injection Inject 10 Units into the skin 3 (three) times daily before meals. Patient taking differently: Inject 16-22 Units into the skin 3 (three) times daily before meals. (Based on blood glucose reading) 04/26/21   Nita Sells, MD  insulin glargine (LANTUS) 100 UNIT/ML injection Inject 0.16 mLs (16 Units total) into the skin daily. Patient  taking differently: Inject 14 Units into the skin daily. 04/26/21   Nita Sells, MD  ketotifen (ZADITOR) 0.025 % ophthalmic solution Place 1 drop into both eyes 2 (two) times daily.    [provider]  Lactobacillus Rhamnosus, GG, (RA PROBIOTIC DIGESTIVE CARE) CAPS Take 1 capsule by mouth daily.    [provider]  lactulose (CHRONULAC) 10 GM/15ML solution Take 30 g by mouth daily.    [provider]  levothyroxine (SYNTHROID) 100 MCG tablet Take 100 mcg by mouth daily before breakfast.    [provider]  lidocaine (LIDODERM) 5 % Place 2 patches onto the skin daily. Remove & Discard patch within 12 hours or as directed by MD 05/28/22   Enzo Bi, MD  magnesium oxide (MAG-OX) 400 MG tablet Take 2 tablets (800 mg total) by mouth 2 (two) times daily. 05/28/22   Enzo Bi, MD  Melatonin 10 MG TABS Take 10 mg by mouth at bedtime.    [provider]  Omega-3 Fatty Acids (FISH OIL) 1000 MG CAPS Take 1 capsule by mouth 2 (two) times daily.    [provider]  pantoprazole (PROTONIX) 40 MG tablet Take 1 tablet (40 mg total) by mouth 2 (two) times daily. 04/07/22 06/06/22  Lorella Nimrod, MD  rifaximin (XIFAXAN) 550 MG TABS tablet Take 550 mg by mouth 2 (two) times daily.    [provider]  Semaglutide,0.25 or 0.5MG/DOS, 2 MG/1.5ML SOPN Inject 0.5 mg into the skin once a week.    [provider]  terazosin (HYTRIN) 5 MG capsule Take 5 mg by mouth at bedtime.    [provider]  trolamine salicylate (ASPERCREME) 10 % cream Apply topically as needed for muscle pain. 05/28/22   Enzo Bi, MD  vitamin C (ASCORBIC ACID) 500 MG tablet Take 1,000 mg by mouth daily.    [provider]    Physical Exam: Vitals:   08/02/22 1813 08/02/22 1817 08/02/22 2100 08/02/22 2130  BP: (!) 147/78  (!) 147/73 (!) 156/74  Pulse: 71  (!) 58 64  Resp: 16  12 13   Temp: 98.1 F (36.7 C)     TempSrc: Oral     SpO2: 99%  100% 98%   Weight:  105.2 kg    Height:  6' 2"  (1.88 m)     *** Data Reviewed: {Tip this will not be part of the note when signed- Document your independent interpretation of telemetry tracing, EKG, lab, Radiology test or any other diagnostic tests. Add any new diagnostic test ordered today. (Optional):26781} {Results:26384}  Assessment and Plan: No notes have been filed under this hospital service. Service: Hospitalist     Advance Care Planning:   Code Status: Prior ***  Consults: ***  Family Communication: ***  Severity of Illness: {Observation/Inpatient:21159}  AuthorBarbette Merino, MD 08/02/2022 10:10 PM  For on call review [email or web address].

## 2022-08-03 DIAGNOSIS — Z794 Long term (current) use of insulin: Secondary | ICD-10-CM | POA: Diagnosis not present

## 2022-08-03 DIAGNOSIS — Z79899 Other long term (current) drug therapy: Secondary | ICD-10-CM | POA: Diagnosis not present

## 2022-08-03 DIAGNOSIS — E1165 Type 2 diabetes mellitus with hyperglycemia: Secondary | ICD-10-CM | POA: Diagnosis present

## 2022-08-03 DIAGNOSIS — K7460 Unspecified cirrhosis of liver: Secondary | ICD-10-CM | POA: Diagnosis present

## 2022-08-03 DIAGNOSIS — K7682 Hepatic encephalopathy: Secondary | ICD-10-CM | POA: Diagnosis present

## 2022-08-03 DIAGNOSIS — D696 Thrombocytopenia, unspecified: Secondary | ICD-10-CM | POA: Diagnosis present

## 2022-08-03 DIAGNOSIS — Z85820 Personal history of malignant melanoma of skin: Secondary | ICD-10-CM | POA: Diagnosis not present

## 2022-08-03 DIAGNOSIS — E039 Hypothyroidism, unspecified: Secondary | ICD-10-CM

## 2022-08-03 DIAGNOSIS — E663 Overweight: Secondary | ICD-10-CM | POA: Diagnosis present

## 2022-08-03 DIAGNOSIS — Z8249 Family history of ischemic heart disease and other diseases of the circulatory system: Secondary | ICD-10-CM | POA: Diagnosis not present

## 2022-08-03 DIAGNOSIS — M1990 Unspecified osteoarthritis, unspecified site: Secondary | ICD-10-CM | POA: Diagnosis present

## 2022-08-03 DIAGNOSIS — Z87891 Personal history of nicotine dependence: Secondary | ICD-10-CM | POA: Diagnosis not present

## 2022-08-03 DIAGNOSIS — Z7982 Long term (current) use of aspirin: Secondary | ICD-10-CM | POA: Diagnosis not present

## 2022-08-03 DIAGNOSIS — I110 Hypertensive heart disease with heart failure: Secondary | ICD-10-CM | POA: Diagnosis present

## 2022-08-03 DIAGNOSIS — Z91040 Latex allergy status: Secondary | ICD-10-CM | POA: Diagnosis not present

## 2022-08-03 DIAGNOSIS — I5032 Chronic diastolic (congestive) heart failure: Secondary | ICD-10-CM

## 2022-08-03 DIAGNOSIS — Z20822 Contact with and (suspected) exposure to covid-19: Secondary | ICD-10-CM | POA: Diagnosis present

## 2022-08-03 DIAGNOSIS — K7581 Nonalcoholic steatohepatitis (NASH): Secondary | ICD-10-CM | POA: Diagnosis present

## 2022-08-03 DIAGNOSIS — Z9049 Acquired absence of other specified parts of digestive tract: Secondary | ICD-10-CM | POA: Diagnosis not present

## 2022-08-03 DIAGNOSIS — G4733 Obstructive sleep apnea (adult) (pediatric): Secondary | ICD-10-CM | POA: Diagnosis present

## 2022-08-03 DIAGNOSIS — Z888 Allergy status to other drugs, medicaments and biological substances status: Secondary | ICD-10-CM | POA: Diagnosis not present

## 2022-08-03 DIAGNOSIS — Z882 Allergy status to sulfonamides status: Secondary | ICD-10-CM | POA: Diagnosis not present

## 2022-08-03 DIAGNOSIS — Z79890 Hormone replacement therapy (postmenopausal): Secondary | ICD-10-CM | POA: Diagnosis not present

## 2022-08-03 LAB — COMPREHENSIVE METABOLIC PANEL
ALT: 28 U/L (ref 0–44)
AST: 44 U/L — ABNORMAL HIGH (ref 15–41)
Albumin: 3.4 g/dL — ABNORMAL LOW (ref 3.5–5.0)
Alkaline Phosphatase: 90 U/L (ref 38–126)
Anion gap: 8 (ref 5–15)
BUN: 16 mg/dL (ref 8–23)
CO2: 19 mmol/L — ABNORMAL LOW (ref 22–32)
Calcium: 9.1 mg/dL (ref 8.9–10.3)
Chloride: 109 mmol/L (ref 98–111)
Creatinine, Ser: 0.97 mg/dL (ref 0.61–1.24)
GFR, Estimated: >60 mL/min (ref >60)
Glucose, Bld: 146 mg/dL — ABNORMAL HIGH (ref 70–99)
Potassium: 3.2 mmol/L — ABNORMAL LOW (ref 3.5–5.1)
Sodium: 136 mmol/L (ref 135–145)
Total Bilirubin: 2.9 mg/dL — ABNORMAL HIGH (ref 0.3–1.2)
Total Protein: 6.8 g/dL (ref 6.5–8.1)

## 2022-08-03 LAB — CBC
HCT: 33 % — ABNORMAL LOW (ref 39.0–52.0)
Hemoglobin: 11.6 g/dL — ABNORMAL LOW (ref 13.0–17.0)
MCH: 32.7 pg (ref 26.0–34.0)
MCHC: 35.2 g/dL (ref 30.0–36.0)
MCV: 93 fL (ref 80.0–100.0)
Platelets: 71 10*3/uL — ABNORMAL LOW (ref 150–400)
RBC: 3.55 MIL/uL — ABNORMAL LOW (ref 4.22–5.81)
RDW: 15.1 % (ref 11.5–15.5)
WBC: 5.2 10*3/uL (ref 4.0–10.5)
nRBC: 0 % (ref 0.0–0.2)

## 2022-08-03 LAB — GLUCOSE, CAPILLARY
Glucose-Capillary: 138 mg/dL — ABNORMAL HIGH (ref 70–99)
Glucose-Capillary: 190 mg/dL — ABNORMAL HIGH (ref 70–99)
Glucose-Capillary: 137 mg/dL — ABNORMAL HIGH (ref 70–99)
Glucose-Capillary: 157 mg/dL — ABNORMAL HIGH (ref 70–99)

## 2022-08-03 LAB — BRAIN NATRIURETIC PEPTIDE: B Natriuretic Peptide: 47.7 pg/mL (ref 0.0–100.0)

## 2022-08-03 MED ORDER — LEVOTHYROXINE SODIUM 100 MCG PO TABS
100.0000 ug | ORAL_TABLET | Freq: Every day | ORAL | Status: DC
  Administered 2022-08-04 – 2022-08-05 (×2): 100 ug via ORAL
  Filled 2022-08-03 (×2): qty 1

## 2022-08-03 MED ORDER — FAMOTIDINE 20 MG PO TABS
20.0000 mg | ORAL_TABLET | Freq: Every day | ORAL | Status: DC
  Administered 2022-08-03 – 2022-08-05 (×3): 20 mg via ORAL
  Filled 2022-08-03 (×3): qty 1

## 2022-08-03 MED ORDER — FUROSEMIDE 20 MG PO TABS
20.0000 mg | ORAL_TABLET | Freq: Every day | ORAL | Status: DC
  Administered 2022-08-03 – 2022-08-05 (×3): 20 mg via ORAL
  Filled 2022-08-03 (×3): qty 1

## 2022-08-03 MED ORDER — ASPIRIN 81 MG PO TBEC
81.0000 mg | DELAYED_RELEASE_TABLET | Freq: Every day | ORAL | Status: DC
  Administered 2022-08-03 – 2022-08-05 (×3): 81 mg via ORAL
  Filled 2022-08-03 (×3): qty 1

## 2022-08-03 MED ORDER — ATORVASTATIN CALCIUM 20 MG PO TABS
20.0000 mg | ORAL_TABLET | Freq: Every day | ORAL | Status: DC
  Administered 2022-08-03 – 2022-08-04 (×2): 20 mg via ORAL
  Filled 2022-08-03 (×2): qty 1

## 2022-08-03 MED ORDER — POTASSIUM CHLORIDE CRYS ER 20 MEQ PO TBCR
40.0000 meq | EXTENDED_RELEASE_TABLET | Freq: Once | ORAL | Status: AC
  Administered 2022-08-03: 40 meq via ORAL
  Filled 2022-08-03: qty 2

## 2022-08-03 MED ORDER — RIFAXIMIN 550 MG PO TABS
550.0000 mg | ORAL_TABLET | Freq: Three times a day (TID) | ORAL | Status: DC
  Administered 2022-08-03 – 2022-08-05 (×6): 550 mg via ORAL
  Filled 2022-08-03 (×6): qty 1

## 2022-08-03 MED ORDER — FOLIC ACID 1 MG PO TABS
1.0000 mg | ORAL_TABLET | Freq: Every day | ORAL | Status: DC
  Administered 2022-08-03 – 2022-08-05 (×3): 1 mg via ORAL
  Filled 2022-08-03 (×3): qty 1

## 2022-08-03 MED ORDER — TRAMADOL HCL 50 MG PO TABS: 50.0000 mg | ORAL_TABLET | Freq: Four times a day (QID) | ORAL | Status: DC | PRN

## 2022-08-03 NOTE — Assessment & Plan Note (Signed)
Continue Synthroid °

## 2022-08-03 NOTE — Assessment & Plan Note (Signed)
Patient placed on lactulose and rifaximin, (with increased dosing of both).  By 8/21 felt to be back at baseline.  We will plan to discharge on home dose of rifaximin and increase lactulose from once a day to twice a day. Patient seen by PT and OT recommended home health at his assisted living facility

## 2022-08-03 NOTE — Plan of Care (Signed)

## 2022-08-03 NOTE — Evaluation (Signed)
Occupational Therapy Evaluation Patient Details Name: Joshua Berry MRN: [id number] DOB: 1946-03-19 Today's Date: 08/03/2022   History of Present Illness Pt is a 76 year old male admitted with  hepatic encephalopathy; PMH significant for Nash cirrhosis, diabetes, hypertension, hypothyroidism, obstructive sleep apnea, chronic thrombocytopenia and history of melanoma   Clinical Impression   Chart reviewed, RN cleared pt for participation in OT evaluation. Pt son is in room throughout evaluation. Pt is alert to self only. Pt son reports cognition is not at baseline. PTA pt is amb with rollator, just moved to [place]. One fall noted last week out of bed when pt running a fever per pt son report. Pt performs bed mobility with supervision, STS with MIN A, amb in room 20' with supervision-CGA with RW. Toilet transfer completed with [place]. Discussed with son re: recommendation for someone to assist with med management if pt were to return to ALF with home therapy. Pt also has a wheelchair to use if needed. Pt presents with deficits in activity tolerance, cognition affecting optimal and safe ADL completion. Recommend discharge back to ALF with HHOT. Pt is left as received, all needs met. OT will continue to follow acutely.      Recommendations for follow up therapy are one component of a multi-disciplinary discharge planning process, led by the attending physician.  Recommendations may be updated based on patient status, additional functional criteria and insurance authorization.   Follow Up Recommendations  Home health OT - at [place] Recommended at Discharge Frequent or constant Supervision/Assistance  Patient can return home with the following Direct supervision/assist for medications management;A little help with bathing/dressing/bathroom;Assistance with cooking/housework    Functional Status Assessment  Patient has had a recent decline in their functional status and  demonstrates the ability to make significant improvements in function in a reasonable and predictable amount of time.  Equipment Recommendations  BSC/3in1    Recommendations for Other Services       Precautions / Restrictions Precautions Precautions: Fall Restrictions Weight Bearing Restrictions: No      Mobility Bed Mobility Overal bed mobility: Needs Assistance Bed Mobility: Supine to Sit, Sit to Supine     Supine to sit: Supervision, HOB elevated Sit to supine: Supervision, HOB elevated        Transfers Overall transfer level: Needs assistance   Transfers: Sit to/from Stand Sit to Stand: Min assist                  Balance Overall balance assessment: Needs assistance Sitting-balance support: Feet supported Sitting balance-Leahy Scale: Good     Standing balance support: Bilateral upper extremity supported, During functional activity, Reliant on assistive device for balance Standing balance-Leahy Scale: Fair                             ADL either performed or assessed with clinical judgement   ADL Overall ADL's : Needs assistance/impaired                     Lower Body Dressing: Maximal assistance Lower Body Dressing Details (indicate cue type and reason): socks Toilet Transfer: Min guard;BSC/3in1;Ambulation   Toileting- Clothing Manipulation and Hygiene: Maximal assistance;Sit to/from stand       Functional mobility during ADLs: Min guard;Rolling walker (2 wheels) (approx 20' in room with RW)       Vision Patient Visual Report: No change from baseline  Perception     Praxis      Pertinent Vitals/Pain Pain Assessment Pain Assessment: Faces Faces Pain Scale: Hurts a little bit Pain Location: R shoulder from fall Pain Descriptors / Indicators: Grimacing Pain Intervention(s): Limited activity within patient's tolerance, Monitored during session, Premedicated before session, Repositioned     Hand Dominance      Extremity/Trunk Assessment Upper Extremity Assessment Upper Extremity Assessment: Generalized weakness (approx 4-/5 throughout BUE)   Lower Extremity Assessment Lower Extremity Assessment: Defer to PT evaluation   Cervical / Trunk Assessment Cervical / Trunk Assessment:  (forward head and shoulders)   Communication Communication Communication: No difficulties   Cognition Arousal/Alertness: Awake/alert Behavior During Therapy: Flat affect Overall Cognitive Status: Impaired/Different from baseline Area of Impairment: Orientation, Attention, Memory, Following commands, Safety/judgement, Awareness, Problem solving                 Orientation Level: Disoriented to, Time, Situation, Place Current Attention Level: Sustained Memory: Decreased recall of precautions Following Commands: Follows one step commands with increased time Safety/Judgement: Decreased awareness of safety, Decreased awareness of deficits Awareness: Emergent Problem Solving: Slow processing, Decreased initiation, Difficulty sequencing, Requires verbal cues, Requires tactile cues General Comments: Pt son reports pt is typically "sharp as a tack"     General Comments       Exercises     Shoulder Instructions      Home Living Family/patient expects to be discharged to:: Assisted living                             Home Equipment: Rollator (4 wheels);Cane - single point;Shower seat   Additional Comments: recently moved to [facility] this past week      Prior Functioning/Environment Prior Level of Function : Needs assist;History of Falls (last six months)             Mobility Comments: amb with rollator, fall last week when not feeling well out of bed ADLs Comments: assist with bathing; pt is generally MOD I in dressing, grooming, toileting; assist for IADLs from faciltiy        OT Problem List: Decreased strength;Decreased activity tolerance;Impaired balance (sitting and/or  standing);Decreased cognition;Decreased safety awareness;Decreased knowledge of use of DME or AE      OT Treatment/Interventions: Self-care/ADL training;Patient/family education;Therapeutic exercise;Balance training;Therapeutic activities;DME and/or AE instruction    OT Goals(Current goals can be found in the care plan section) Acute Rehab OT Goals Patient Stated Goal: go back to ALF OT Goal Formulation: With patient/family Time For Goal Achievement: 08/17/22 Potential to Achieve Goals: Good ADL Goals Pt Will Perform Grooming: standing;with modified independence;sitting Pt Will Perform Lower Body Dressing: with modified independence;sit to/from stand Pt Will Transfer to Toilet: with modified independence Pt Will Perform Toileting - Clothing Manipulation and hygiene: with modified independence;sit to/from stand  OT Frequency: Min 2X/week    Co-evaluation              AM-PAC OT "6 Clicks" Daily Activity     Outcome Measure Help from another person eating meals?: None Help from another person taking care of personal grooming?: None Help from another person toileting, which includes using toliet, bedpan, or urinal?: A Lot Help from another person bathing (including washing, rinsing, drying)?: A Little Help from another person to put on and taking off regular upper body clothing?: A Little Help from another person to put on and taking off regular lower body clothing?: A Lot 6 Click  Score: 18   End of Session Equipment Utilized During Treatment: Gait belt;Rolling walker (2 wheels) Nurse Communication: Mobility status  Activity Tolerance: Patient tolerated treatment well Patient left: in bed;with bed alarm set;with call bell/phone within reach;with family/visitor present  OT Visit Diagnosis: Unsteadiness on feet (R26.81);History of falling (Z91.81)                Time: 3388-2666 OT Time Calculation (min): 27 min Charges:  OT General Charges $OT Visit: 1 Visit OT Evaluation $OT  Eval Low Complexity: 1 Low OT Treatments $Self Care/Home Management : 8-22 mins  Shanon Payor, OTD OTR/L  08/03/22, 3:24 PM

## 2022-08-03 NOTE — TOC Progression Note (Signed)
Transition of Care [facility]) - Progression Note    Patient Details  Name: Joshua Berry MRN: [id number] Date of Birth: 05-17-1946  Transition of Care [facility]) CM/SW Contact  Izola Price, RN Phone Number: 08/03/2022, 3:27 PM  Clinical Narrative: 8/19: Left VM with Arelia Sneddon at [facility] to see if assessment needed before patient can return. Just moved in to [place] last week with wife. Simmie Davies RN CM            Expected Discharge Plan and Services                                                 Social Determinants of Health (SDOH) Interventions    Readmission Risk Interventions    05/17/2022    1:45 PM  Readmission Risk Prevention Plan  Transportation Screening Complete  PCP or Specialist Appt within 5-7 Days Complete  Home Care Screening Complete  Medication Review (RN CM) Complete

## 2022-08-03 NOTE — Assessment & Plan Note (Signed)
At this time, appears euvolemic.  Echocardiogram noted grade 1 diastolic dysfunction and preserved ejection fraction in June.  BNP on admission normal

## 2022-08-03 NOTE — Hospital Course (Signed)
Patient is a 76 year old male with past medical history of [name] cirrhosis, diabetes mellitus, hypertension, obstructive sleep apnea and chronic thrombocytopenia who previously had presented to the emergency department on 8/16 night with complaints of fever and not feeling well.  Work-up was unremarkable and patient was not febrile in the emergency department although was by EMS.  He was discharged home.  Patient returned back to the emergency room on the evening of 8/18 with complaints of forgetfulness and sluggishness and patient was unsure if he had taken his lactulose.  Ammonia level mildly elevated at 63 and patient was brought in for further evaluation to the hospitalist service.  Patient started on lactulose and continued on home rifaximin.  By 8/21, patient had several bowel movement and feeling much more coherent, back to baseline.  Seen by PT and OT who recommended home health.

## 2022-08-03 NOTE — Progress Notes (Signed)
[facility] Progress Note  [name]    QQV:[id number]  DOA: 08/02/2022    Date of Service: the patient was seen and examined on 08/03/2022  Brief hospital course: Patient is a 76 year old male with past medical history of [name] cirrhosis, diabetes mellitus, hypertension, obstructive sleep apnea and chronic thrombocytopenia who previously had presented to the emergency department on 8/16 night with complaints of fever and not feeling well.  Work-up was unremarkable and patient was not febrile in the emergency department although was by EMS.  He was discharged home.  Patient returned back to the emergency room on the evening of 8/18 with complaints of forgetfulness and sluggishness and patient was unsure if he had taken his lactulose.  Ammonia level mildly elevated at 63 and patient was brought in for further evaluation to the hospitalist service.  Patient started on lactulose and continued on home rifaximin.  Assessment and Plan: Assessment and Plan: * Hepatic encephalopathy (HCC) Continue lactulose and rifaximin.  Waiting for bowel movements.  Patient still confused.  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  Continue to monitor.  Chronic diastolic CHF (congestive heart failure) (HCC) At this time, appears euvolemic.  Echocardiogram noted grade 1 diastolic dysfunction and preserved ejection fraction in June.  BNP on admission normal  Controlled IDDM-2 with hyperglycemia CBGs have remained stable.  Last A1c at 4.8 in May of last year.  We will recheck.  Hypothyroidism Continue Synthroid  Overweight (BMI 25.0-29.9) Meets criteria with BMI greater than 25       Body mass index is 27.86 kg/m.        Consultants: None  Procedures: None  Antimicrobials: None  Code Status: Full code   Subjective: Patient states he does not feel well, confused  Objective: Vital signs were reviewed and unremarkable. Vitals:    08/03/22 0410 08/03/22 0908  BP: (!) 140/74 (!) 143/75  Pulse: 72 68  Resp: 18 18  Temp: 98.7 F (37.1 C) 98.3 F (36.8 C)  SpO2: 98% 100%    Intake/Output Summary (Last 24 hours) at 08/03/2022 1336 Last data filed at 08/03/2022 1053 Gross per 24 hour  Intake 442.06 ml  Output 1125 ml  Net -682.94 ml   Filed Weights   08/02/22 1817 08/03/22 0042  Weight: 105.2 kg 98.4 kg   Body mass index is 27.86 kg/m.  Exam:  General: Oriented x2, no acute distress HEENT: Normocephalic and atraumatic, mucous membranes are slightly dry Cardiovascular: Giller rate and rhythm, S1-S2 Respiratory: Clear to auscultation bilaterally Abdomen: Soft, nontender, nondistended, positive bowel sounds Musculoskeletal: Clubbing or cyanosis, trace pitting edema Skin: Skin breaks, tears or lesions Psychiatry: Acutely delirious from hepatic encephalopathy, but no active psychoses.  He has insight that he is confused Neurology: No focal deficits  Data Reviewed: Potassium 3.2.  Albumin of 3.4.  AST down to 44 and total bilirubin of 2.9.  Normal BNP.  Disposition:  Status is: Observation     Anticipated discharge date: 8/20  Remaining issues to be resolved so that patient can be discharged:  -Improvement in mentation -Evaluation by PT/OT   Family Communication: Updated stepson DVT Prophylaxis: SCDs Start: 08/02/22 2234    Author: Annita Brod ,MD 08/03/2022 1:36 PM  To reach On-call, see care teams to locate the attending and reach out via [email or web address]. Between 7PM-7AM, please contact night-coverage If you still have difficulty reaching the attending provider, please page the [facility] (Director on Call) for [facility] on amion for assistance.

## 2022-08-03 NOTE — Assessment & Plan Note (Signed)
Meets criteria with BMI greater than 25 

## 2022-08-03 NOTE — Assessment & Plan Note (Signed)
CBGs have remained stable.  Last A1c at 6.0 in April of this year.

## 2022-08-03 NOTE — Assessment & Plan Note (Signed)
Stable.       - Continue to monitor

## 2022-08-04 DIAGNOSIS — E039 Hypothyroidism, unspecified: Secondary | ICD-10-CM | POA: Diagnosis not present

## 2022-08-04 DIAGNOSIS — K7682 Hepatic encephalopathy: Secondary | ICD-10-CM | POA: Diagnosis not present

## 2022-08-04 DIAGNOSIS — K7460 Unspecified cirrhosis of liver: Secondary | ICD-10-CM | POA: Diagnosis not present

## 2022-08-04 DIAGNOSIS — I5032 Chronic diastolic (congestive) heart failure: Secondary | ICD-10-CM | POA: Diagnosis not present

## 2022-08-04 LAB — GLUCOSE, CAPILLARY
Glucose-Capillary: 151 mg/dL — ABNORMAL HIGH (ref 70–99)
Glucose-Capillary: 172 mg/dL — ABNORMAL HIGH (ref 70–99)
Glucose-Capillary: 183 mg/dL — ABNORMAL HIGH (ref 70–99)
Glucose-Capillary: 152 mg/dL — ABNORMAL HIGH (ref 70–99)

## 2022-08-04 NOTE — Evaluation (Signed)
Physical Therapy Evaluation Patient Details Name: Joshua Berry MRN: [id number] DOB: 08-01-1946 Today's Date: 08/04/2022  History of Present Illness  Pt is a 76 year old male admitted with  hepatic encephalopathy; PMH significant for Nash cirrhosis, diabetes, hypertension, hypothyroidism, obstructive sleep apnea, chronic thrombocytopenia and history of melanoma  Clinical Impression  Pt is a pleasant 76 year old male who was admitted for hepatic encephalopathy. Pt performs bed mobility with supervision, transfers with cga, and ambulation with cga and RW. Pt demonstrates deficits with strength/endurance/mobility. Pt is close to baseline level although does feel he has cognitive deficits including word finding. Would benefit from skilled PT to address above deficits and promote optimal return to PLOF. Recommend transition to [place] [facility] upon discharge from acute hospitalization.      Recommendations for follow up therapy are one component of a multi-disciplinary discharge planning process, led by the attending physician.  Recommendations may be updated based on patient status, additional functional criteria and insurance authorization.  Follow Up Recommendations Home health PT      Assistance Recommended at Discharge Intermittent Supervision/Assistance  Patient can return home with the following  A little help with walking and/or transfers    Equipment Recommendations None recommended by PT  Recommendations for Other Services       Functional Status Assessment Patient has had a recent decline in their functional status and demonstrates the ability to make significant improvements in function in a reasonable and predictable amount of time.     Precautions / Restrictions Precautions Precautions: Fall Restrictions Weight Bearing Restrictions: No      Mobility  Bed Mobility Overal bed mobility: Needs Assistance Bed Mobility: Supine to Sit     Supine to sit: Supervision     General bed  mobility comments: safe technique with upright posture once seated at EOB. No dizziness noted    Transfers Overall transfer level: Needs assistance Equipment used: Rolling walker (2 wheels) Transfers: Sit to/from Stand Sit to Stand: Min guard           General transfer comment: safe technique with no cues for hand placement    Ambulation/Gait Ambulation/Gait assistance: Min guard Gait Distance (Feet): 100 Feet Assistive device: Rolling walker (2 wheels) Gait Pattern/deviations: Step-through pattern       General Gait Details: ambulated in hallway with reciprocal gait pattern. Stays too far away from RW. Further distance limited due to back pain from recent falls prior to admission  Stairs            Wheelchair Mobility    Modified Rankin (Stroke Patients Only)       Balance Overall balance assessment: Needs assistance Sitting-balance support: Feet supported Sitting balance-Leahy Scale: Good     Standing balance support: Bilateral upper extremity supported, During functional activity, Reliant on assistive device for balance Standing balance-Leahy Scale: Fair Standing balance comment: was able to stand without B UE, however poor balance noted                             Pertinent Vitals/Pain Pain Assessment Pain Assessment: Faces Faces Pain Scale: Hurts a little bit Pain Location: R shoulder from fall Pain Descriptors / Indicators: Grimacing Pain Intervention(s): Limited activity within patient's tolerance, Repositioned, Patient requesting pain meds-RN notified    Home Living Family/patient expects to be discharged to:: Assisted living                 Home Equipment: Rollator (4 wheels);Cane -  single point;Shower Land (2 wheels) Additional Comments: recently moved to [facility] this past week    Prior Function Prior Level of Function : Needs assist;History of Falls (last six months)             Mobility  Comments: amb with rollator, fall last week when not feeling well out of bed ADLs Comments: assist with bathing; pt is generally MOD I in dressing, grooming, toileting; assist for IADLs from faciltiy     Hand Dominance        Extremity/Trunk Assessment   Upper Extremity Assessment Upper Extremity Assessment: Defer to OT evaluation    Lower Extremity Assessment Lower Extremity Assessment: Generalized weakness (B LE grossly 3+/5)       Communication   Communication: No difficulties  Cognition Arousal/Alertness: Awake/alert Behavior During Therapy: Flat affect Overall Cognitive Status: History of cognitive impairments - at baseline                                 General Comments: Pt reports he is having trouble word finding        General Comments      Exercises     Assessment/Plan    PT Assessment Patient needs continued PT services  PT Problem List Decreased activity tolerance;Decreased balance;Decreased mobility       PT Treatment Interventions Gait training;DME instruction;Therapeutic exercise;Balance training    PT Goals (Current goals can be found in the Care Plan section)  Acute Rehab PT Goals Patient Stated Goal: to go back to ALF PT Goal Formulation: With patient Time For Goal Achievement: 08/18/22 Potential to Achieve Goals: Good    Frequency Min 2X/week     Co-evaluation               AM-PAC PT "6 Clicks" Mobility  Outcome Measure Help needed turning from your back to your side while in a flat bed without using bedrails?: None Help needed moving from lying on your back to sitting on the side of a flat bed without using bedrails?: None Help needed moving to and from a bed to a chair (including a wheelchair)?: A Little Help needed standing up from a chair using your arms (e.g., wheelchair or bedside chair)?: A Little Help needed to walk in hospital room?: A Little Help needed climbing 3-5 steps with a railing? : A Little 6  Click Score: 20    End of Session Equipment Utilized During Treatment: Gait belt Activity Tolerance: Patient tolerated treatment well Patient left: in chair;with chair alarm set Nurse Communication: Mobility status PT Visit Diagnosis: Difficulty in walking, not elsewhere classified (R26.2);Unsteadiness on feet (R26.81)    Time: 5956-3875 PT Time Calculation (min) (ACUTE ONLY): 21 min   Charges:   PT Evaluation $PT Eval Low Complexity: 1 Low PT Treatments $Gait Training: 8-22 mins        Greggory Stallion, PT, DPT, GCS [phone]   Ashley Montminy 08/04/2022, 9:10 AM

## 2022-08-04 NOTE — Plan of Care (Signed)
  Problem: Education: Goal: Ability to describe self-care measures that may prevent or decrease complications (Diabetes Survival Skills Education) will improve Outcome: Progressing   Problem: Education: Goal: Ability to describe self-care measures that may prevent or decrease complications (Diabetes Survival Skills Education) will improve Outcome: Progressing Goal: Individualized Educational Video(s) Outcome: Progressing   Problem: Coping: Goal: Ability to adjust to condition or change in health will improve Outcome: Progressing   Problem: Fluid Volume: Goal: Ability to maintain a balanced intake and output will improve Outcome: Progressing   Problem: Health Behavior/Discharge Planning: Goal: Ability to identify and utilize available resources and services will improve Outcome: Progressing Goal: Ability to manage health-related needs will improve Outcome: Progressing   Problem: Metabolic: Goal: Ability to maintain appropriate glucose levels will improve Outcome: Progressing   Problem: Nutritional: Goal: Maintenance of adequate nutrition will improve Outcome: Progressing Goal: Progress toward achieving an optimal weight will improve Outcome: Progressing   Problem: Skin Integrity: Goal: Risk for impaired skin integrity will decrease Outcome: Progressing   Problem: Tissue Perfusion: Goal: Adequacy of tissue perfusion will improve Outcome: Progressing   Problem: Education: Goal: Knowledge of General Education information will improve Description: Including pain rating scale, medication(s)/side effects and non-pharmacologic comfort measures Outcome: Progressing   Problem: Health Behavior/Discharge Planning: Goal: Ability to manage health-related needs will improve Outcome: Progressing   Problem: Clinical Measurements: Goal: Ability to maintain clinical measurements within normal limits will improve Outcome: Progressing Goal: Will remain free from infection Outcome:  Progressing Goal: Diagnostic test results will improve Outcome: Progressing Goal: Respiratory complications will improve Outcome: Progressing Goal: Cardiovascular complication will be avoided Outcome: Progressing   Problem: Activity: Goal: Risk for activity intolerance will decrease Outcome: Progressing   Problem: Nutrition: Goal: Adequate nutrition will be maintained Outcome: Progressing   Problem: Coping: Goal: Level of anxiety will decrease Outcome: Progressing   Problem: Elimination: Goal: Will not experience complications related to bowel motility Outcome: Progressing Goal: Will not experience complications related to urinary retention Outcome: Progressing   Problem: Pain Managment: Goal: General experience of comfort will improve Outcome: Progressing   Problem: Safety: Goal: Ability to remain free from injury will improve Outcome: Progressing   Problem: Skin Integrity: Goal: Risk for impaired skin integrity will decrease Outcome: Progressing

## 2022-08-04 NOTE — Progress Notes (Signed)
[facility] Progress Note  [name]    HGD:[id number]  DOA: 08/02/2022    Date of Service: the patient was seen and examined on 08/04/2022  Brief hospital course: Patient is a 76 year old male with past medical history of [name] cirrhosis, diabetes mellitus, hypertension, obstructive sleep apnea and chronic thrombocytopenia who previously had presented to the emergency department on 8/16 night with complaints of fever and not feeling well.  Work-up was unremarkable and patient was not febrile in the emergency department although was by EMS.  He was discharged home.  Patient returned back to the emergency room on the evening of 8/18 with complaints of forgetfulness and sluggishness and patient was unsure if he had taken his lactulose.  Ammonia level mildly elevated at 63 and patient was brought in for further evaluation to the hospitalist service.  Patient started on lactulose and continued on home rifaximin.  By 8/20, has had some bowel movements and is feeling a little better although not yet back to baseline.  Assessment and Plan: Assessment and Plan: * Hepatic encephalopathy (HCC) Continue lactulose and rifaximin, (with increased dosing of rifaximin).  Started having some bowel movements, confusion improving. 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  Continue to monitor.  Chronic diastolic CHF (congestive heart failure) (HCC) At this time, appears euvolemic.  Echocardiogram noted grade 1 diastolic dysfunction and preserved ejection fraction in June.  BNP on admission normal  Controlled IDDM-2 with hyperglycemia CBGs have remained stable.  Last A1c at 4.8 in May of last year.  We will recheck.  Hypothyroidism Continue Synthroid  Overweight (BMI 25.0-29.9) Meets criteria with BMI greater than 25       Body mass index is 27.86 kg/m.         Consultants: None  Procedures: None  Antimicrobials: None  Code Status: Full code   Subjective: Patient feels a little better, not yet back to baseline  Objective: Vital signs were reviewed and unremarkable. Vitals:   08/04/22 1200 08/04/22 1213  BP: 107/60 107/60  Pulse: (!) 57 (!) 59  Resp: 16   Temp: 98.4 F (36.9 C)   SpO2: 100% 100%    Intake/Output Summary (Last 24 hours) at 08/04/2022 1437 Last data filed at 08/03/2022 2143 Gross per 24 hour  Intake --  Output 725 ml  Net -725 ml   Filed Weights   08/02/22 1817 08/03/22 0042  Weight: 105.2 kg 98.4 kg   Body mass index is 27.86 kg/m.  Exam:  General: Oriented x2, no acute distress HEENT: Normocephalic and atraumatic, mucous membranes are slightly dry Cardiovascular: Giller rate and rhythm, S1-S2 Respiratory: Clear to auscultation bilaterally Abdomen: Soft, nontender, nondistended, positive bowel sounds Musculoskeletal: Clubbing or cyanosis, trace pitting edema Skin: Skin breaks, tears or lesions Psychiatry: Acutely delirious from hepatic encephalopathy, but no active psychoses.  He has insight that he is confused Neurology: No focal deficits  Data Reviewed: No labs today  Disposition:  Status is: Observation     Anticipated discharge date: 8/21  Remaining issues to be resolved so that patient can be discharged:  -Improvement in mentation    Family Communication: Updated stepson DVT Prophylaxis: SCDs Start: 08/02/22 2234    Author: Annita Brod ,MD 08/04/2022 2:37 PM  To reach On-call, see care teams to locate the attending and reach out via [email or web address]. Between 7PM-7AM, please contact night-coverage If you still have difficulty reaching the attending  provider, please page the [facility] (Director on Call) for [facility] on amion for assistance.

## 2022-08-04 NOTE — Plan of Care (Signed)

## 2022-08-05 DIAGNOSIS — K7682 Hepatic encephalopathy: Secondary | ICD-10-CM | POA: Diagnosis not present

## 2022-08-05 DIAGNOSIS — I5032 Chronic diastolic (congestive) heart failure: Secondary | ICD-10-CM | POA: Diagnosis not present

## 2022-08-05 DIAGNOSIS — K7460 Unspecified cirrhosis of liver: Secondary | ICD-10-CM | POA: Diagnosis not present

## 2022-08-05 DIAGNOSIS — E663 Overweight: Secondary | ICD-10-CM | POA: Diagnosis not present

## 2022-08-05 LAB — BASIC METABOLIC PANEL
Anion gap: 7 (ref 5–15)
BUN: 22 mg/dL (ref 8–23)
CO2: 21 mmol/L — ABNORMAL LOW (ref 22–32)
Calcium: 9.6 mg/dL (ref 8.9–10.3)
Chloride: 109 mmol/L (ref 98–111)
Creatinine, Ser: 1 mg/dL (ref 0.61–1.24)
GFR, Estimated: >60 mL/min (ref >60)
Glucose, Bld: 205 mg/dL — ABNORMAL HIGH (ref 70–99)
Potassium: 3.9 mmol/L (ref 3.5–5.1)
Sodium: 137 mmol/L (ref 135–145)

## 2022-08-05 LAB — GLUCOSE, CAPILLARY
Glucose-Capillary: 275 mg/dL — ABNORMAL HIGH (ref 70–99)
Glucose-Capillary: 130 mg/dL — ABNORMAL HIGH (ref 70–99)

## 2022-08-05 MED ORDER — LACTULOSE 10 GM/15ML PO SOLN: 30.0000 g | Freq: Two times a day (BID) | ORAL | 12 refills | Status: DC

## 2022-08-05 MED ORDER — INSULIN GLARGINE 100 UNIT/ML ~~LOC~~ SOLN: 14.0000 [IU] | Freq: Every day | SUBCUTANEOUS | Status: DC

## 2022-08-05 NOTE — Inpatient Diabetes Management (Signed)
Inpatient Diabetes Program Recommendations  AACE/ADA: New Consensus Statement on Inpatient Glycemic Control (2015)  Target Ranges:  Prepandial:   less than 140 mg/dL      Peak postprandial:   less than 180 mg/dL (1-2 hours)      Critically ill patients:  140 - 180 mg/dL   Lab Results  Component Value Date   GLUCAP 275 (H) 08/05/2022   HGBA1C 6.0 (H) 04/07/2022    Review of Glycemic Control  Latest Reference Range & Units 08/04/22 17:13 08/04/22 20:34 08/05/22 08:52  Glucose-Capillary 70 - 99 mg/dL 183 (H) 152 (H) 275 (H)  (H): Data is abnormally high Diabetes history: Type 2 DM Outpatient Diabetes medications: Novolog 16-22 units TID, Lantus 14 units QD, Ozempic 0.5 mg qwk Current orders for Inpatient glycemic control: Novolog 0-15 units TID & HS  Inpatient Diabetes Program Recommendations:    Consider adding Semglee 12 units QD.  Thanks, Bronson Curb, MSN, RNC-OB Diabetes Coordinator [phone] (8a-5p)

## 2022-08-05 NOTE — Discharge Summary (Signed)
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  Work-up was unremarkable and patient was not febrile in the emergency department although was by EMS.  He was discharged home.  Patient returned back to the emergency room on the evening of 8/18 with complaints of forgetfulness and sluggishness and patient was unsure if he had taken his lactulose.  Ammonia level mildly elevated at 63 and patient was brought in for further evaluation to the hospitalist service.  Patient started on lactulose and continued on home rifaximin.  By 8/21, patient had several bowel movement and feeling much more coherent, back to baseline.  Seen by PT and OT who recommended home health.  Assessment and Plan: * Hepatic encephalopathy (HCC)-resolved as of 08/05/2022 Patient placed on lactulose and rifaximin, (with increased dosing of both).  By 8/21 felt to be back at baseline.  We will plan to discharge on home dose of rifaximin and increase lactulose from once a day to twice a  day. 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  Continue to monitor.  Chronic diastolic CHF (congestive heart failure) (HCC) At this time, appears euvolemic.  Echocardiogram noted grade 1 diastolic dysfunction and preserved ejection fraction in June.  BNP on admission normal  Controlled IDDM-2 with hyperglycemia CBGs have remained stable.  Last A1c at 6.0 in April of this year.   Hypothyroidism Continue Synthroid  Overweight (BMI 25.0-29.9) Meets criteria with BMI greater than 25         Consultants: None Procedures performed: None Disposition: Back to assisted living with home health PT and OT Diet recommendation:  Discharge Diet Orders (From admission, onward)     Start     Ordered   08/05/22 0000  Diet - low sodium heart healthy        08/05/22 1048           Heart healthy DISCHARGE MEDICATION: Allergies as of 08/05/2022       Reactions   Lisinopril Hives   Sulfamethoxazole Swelling   Camphor Rash, Swelling   Latex Rash   Nickel Rash        Medication List     TAKE these medications    ascorbic acid 500 MG tablet Commonly known as: VITAMIN C Take 1,000 mg by mouth daily.   Aspercreme Lidocaine 4 % Liqd Generic drug: Lidocaine HCl Apply 1 Application topically daily as needed.   aspirin EC 81 MG tablet Take 81 mg by mouth daily.   atorvastatin 20 MG tablet Commonly known as: LIPITOR Take 20  mg by mouth at bedtime.   augmented betamethasone dipropionate 0.05 % cream Commonly known as: DIPROLENE-AF Apply 1 application topically 2 (two) times daily.   Cholecalciferol 50 MCG (2000 UT) Tabs Take 1 tablet by mouth daily.   famotidine 20 MG tablet Commonly known as: PEPCID Take 20 mg by mouth daily.   Fish Oil 1000 MG Caps Take 1 capsule by mouth 2 (two) times daily.   folic acid 1 MG tablet Commonly known as:  FOLVITE Take 1 tablet by mouth daily.   furosemide 20 MG tablet Commonly known as: LASIX Take 1 tablet (20 mg total) by mouth daily.   insulin aspart 100 UNIT/ML injection Commonly known as: novoLOG Inject 10 Units into the skin 3 (three) times daily before meals. What changed:  how much to take additional instructions   insulin glargine 100 UNIT/ML injection Commonly known as: LANTUS Inject 0.14 mLs (14 Units total) into the skin daily.   ketotifen 0.025 % ophthalmic solution Commonly known as: ZADITOR Place 1 drop into both eyes 2 (two) times daily.   lactulose 10 GM/15ML solution Commonly known as: CHRONULAC Take 45 mLs (30 g total) by mouth 2 (two) times daily. What changed: when to take this   levothyroxine 100 MCG tablet Commonly known as: SYNTHROID Take 100 mcg by mouth daily before breakfast.   lidocaine 5 % Commonly known as: LIDODERM Place 2 patches onto the skin daily. Remove & Discard patch within 12 hours or as directed by MD   magnesium oxide 400 MG tablet Commonly known as: MAG-OX Take 2 tablets (800 mg total) by mouth 2 (two) times daily.   Melatonin 10 MG Tabs Take 10 mg by mouth at bedtime.   pantoprazole 40 MG tablet Commonly known as: Protonix Take 1 tablet (40 mg total) by mouth 2 (two) times daily.   RA Probiotic Digestive Care Caps Take 1 capsule by mouth daily.   rifaximin 550 MG Tabs tablet Commonly known as: XIFAXAN Take 550 mg by mouth 2 (two) times daily. Takes with lactulose   Semaglutide(0.25 or 0.5MG/DOS) 2 MG/1.5ML Sopn Inject 0.5 mg into the skin once a week.   terazosin 5 MG capsule Commonly known as: HYTRIN Take 5 mg by mouth at bedtime.   trolamine salicylate 10 % cream Commonly known as: ASPERCREME Apply topically as needed for muscle pain.        Discharge Exam: Filed Weights   08/02/22 1817 08/03/22 0042  Weight: 105.2 kg 98.4 kg   General: Alert and oriented x2-3, no acute distress Cardiac: Regular rate  and rhythm, S1-S2 Lungs: Clear to auscultation bilaterally  Condition at discharge: good  The results of significant diagnostics from this hospitalization (including imaging, microbiology, ancillary and laboratory) are listed below for reference.   Imaging Studies: CT Head Wo Contrast  Result Date: 08/02/2022 CLINICAL DATA:  Altered mental status EXAM: CT HEAD WITHOUT CONTRAST TECHNIQUE: Contiguous axial images were obtained from the base of the skull through the vertex without intravenous contrast. RADIATION DOSE REDUCTION: This exam was performed according to the departmental dose-optimization program which includes automated exposure control, adjustment of the mA and/or kV according to patient size and/or use of iterative reconstruction technique. COMPARISON:  05/14/2022 FINDINGS: Brain: No acute intracranial findings are seen in noncontrast CT brain. There are no signs of bleeding within the cranium. Cortical sulci are prominent. There is no focal edema or mass effect. Vascular: Scattered arterial calcifications are seen. Skull: No fracture is seen in calvarium. Sinuses/Orbits: Unremarkable. Other: None.  IMPRESSION: No acute intracranial findings are seen in noncontrast CT brain. Atrophy. Electronically Signed   By: Elmer Picker M.D.   On: 08/02/2022 18:47   DG Chest Port 1 View  Result Date: 07/31/2022 CLINICAL DATA:  Substernal chest pressure and weakness. EXAM: PORTABLE CHEST 1 VIEW COMPARISON:  May 14, 2022 FINDINGS: The heart size and mediastinal contours are within normal limits. 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 Mildly decreased lung volumes are seen. Both lungs are clear. A left shoulder prosthesis is noted. The visualized skeletal structures are unremarkable. IMPRESSION: No active disease. Electronically Signed   By: Virgina Norfolk M.D.   On: 07/31/2022 23:40    Microbiology: Results for orders placed or performed during the hospital encounter of 07/31/22  SARS Coronavirus  2 by RT PCR (hospital order, performed in [facility] hospital lab) *cepheid single result test* Anterior Nasal Swab     Status: None   Collection Time: 08/01/22 12:03 AM   Specimen: Anterior Nasal Swab  Result Value Ref Range Status   SARS Coronavirus 2 by RT PCR NEGATIVE NEGATIVE Final    Comment: (NOTE) SARS-CoV-2 target nucleic acids are NOT DETECTED.  The SARS-CoV-2 RNA is generally detectable in upper and lower respiratory specimens during the acute phase of infection. The lowest concentration of SARS-CoV-2 viral copies this assay can detect is 250 copies / mL. A negative result does not preclude SARS-CoV-2 infection and should not be used as the sole basis for treatment or other patient management decisions.  A negative result may occur with improper specimen collection / handling, submission of specimen other than nasopharyngeal swab, presence of viral mutation(s) within the areas targeted by this assay, and inadequate number of viral copies (<250 copies / mL). A negative result must be combined with clinical observations, patient history, and epidemiological information.  Fact Sheet for Patients:   https://www.patel.info/  Fact Sheet for Healthcare Providers: https://hall.com/  This test is not yet approved or  cleared by the [place] FDA and has been authorized for detection and/or diagnosis of SARS-CoV-2 by FDA under an Emergency Use Authorization (EUA).  This EUA will remain in effect (meaning this test can be used) for the duration of the COVID-19 declaration under Section 564(b)(1) of the Act, 21 U.S.C. section 360bbb-3(b)(1), unless the authorization is terminated or revoked sooner.  Performed at [facility], [address] [place], [place] [address]     Labs: CBC: Recent Labs  Lab 07/31/22 2330 08/02/22 1820 08/03/22 0429  WBC 3.4* 5.1 5.2  HGB 11.2* 12.8* 11.6*  HCT 32.4* 37.4* 33.0*  MCV 92.8  93.3 93.0  PLT 48* 71* 71*   Basic Metabolic Panel: Recent Labs  Lab 07/31/22 2330 08/02/22 1820 08/03/22 0429 08/05/22 0655  NA 138 138 136 137  K 4.0 3.5 3.2* 3.9  CL 107 107 109 109  CO2 24 20* 19* 21*  GLUCOSE 181* 167* 146* 205*  BUN 19 16 16 22   CREATININE 1.22 1.16 0.97 1.00  CALCIUM 9.1 9.7 9.1 9.6  MG 1.7  --   --   --    Liver Function Tests: Recent Labs  Lab 07/31/22 2330 08/02/22 1820 08/03/22 0429  AST 46* 50* 44*  ALT 31 32 28  ALKPHOS 102 104 90  BILITOT 1.9* 2.8* 2.9*  PROT 7.2 8.0 6.8  ALBUMIN 3.4* 3.8 3.4*   CBG: Recent Labs  Lab 08/04/22 1209 08/04/22 1713 08/04/22 2034 08/05/22 0852 08/05/22 1158  GLUCAP 172* 183* 152* 275* 130*  Discharge time spent: less than 30 minutes.  Signed: Annita Brod, MD [facility] 08/05/2022

## 2022-08-05 NOTE — NC FL2 (Signed)
[place] LEVEL OF CARE SCREENING TOOL     IDENTIFICATION  Patient Name: Joshua Berry Birthdate: Sep 17, 1946 Sex: male Admission Date (Current Location): 08/02/2022  [facility] [place] and [place] Number:  Engineering geologist and Address:  [facility], [address], [place], [place] [address]      Provider Number: [id number]  Attending Physician Name and Address:  Annita Brod, MD  Relative Name and Phone Number:  Bright, Spielmann (Spouse)   [phone] (Mobile)    Current Level of Care: Hospital Recommended Level of Care: [place] Prior Approval Number:    Date Approved/Denied:   PASRR Number: [id number]  Discharge Plan: Other (Comment) ([place])    Current Diagnoses: Patient Active Problem List   Diagnosis Date Noted   Overweight (BMI 25.0-29.9) 08/03/2022   Hepatic encephalopathy ([place]) 08/02/2022   Hyponatremia, hypokalemia and hypomagnesemia 05/18/2022   Hyponatremia 05/18/2022   Hypokalemia 05/18/2022   Back pain 05/17/2022   Hypomagnesemia 05/17/2022   Acute hepatic encephalopathy ([place]) 05/17/2022   AKI (acute kidney injury) ([place]) 05/16/2022   Elevated liver enzymes 05/16/2022   Hypotension 05/16/2022   Right shoulder pain 05/16/2022   Hypothyroidism 05/16/2022   Physical deconditioning 05/16/2022   Obesity (BMI 30-39.9) 05/16/2022   Lactic acidosis 05/16/2022   Fall at home, initial encounter 05/16/2022   Septic shock due to Staphylococcus bacteremia 05/14/2022   Chest pain 04/06/2022   Controlled IDDM-2 with hyperglycemia 04/06/2022   HLD (hyperlipidemia) 04/06/2022   HTN (hypertension) 04/06/2022   Chronic diastolic CHF (congestive heart failure) ([place]) 04/06/2022   Pancytopenia ([place]) 04/06/2022   GI bleeding 04/24/2021   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    Secondary esophageal varices without bleeding ([place])    Portal hypertension (HCC)     Stomach irritation    Melena    Acute gastrointestinal hemorrhage 07/19/2019   Sepsis ([place]) 12/20/2018    Orientation RESPIRATION BLADDER Height & Weight     Self, Time, Situation, Place  Normal Continent Weight: 98.4 kg Height:  6' 2"  (188 cm)  BEHAVIORAL SYMPTOMS/MOOD NEUROLOGICAL BOWEL NUTRITION STATUS      Continent Diet (Carb modified)  AMBULATORY STATUS COMMUNICATION OF NEEDS Skin   Limited Assist Verbally Bruising (Bilateral Buttocks)                       Personal Care Assistance Level of Assistance  Bathing, Feeding, Dressing Bathing Assistance: Limited assistance Feeding assistance: Limited assistance Dressing Assistance: Limited assistance     Functional Limitations Info  Sight, Hearing, Speech Sight Info: Adequate Hearing Info: Adequate Speech Info: Adequate    SPECIAL CARE FACTORS FREQUENCY  PT (By licensed PT)     PT Frequency: As per [facility] and facility              Contractures Contractures Info: Not present    Additional Factors Info  Code Status, Allergies Code Status Info: FULL CODE Allergies Info: Lisinopril Not Specified  Hives   Sulfamethoxazole Not Specified  Swelling   Camphor Low  Rash, Swelling   Latex Low  Rash   Nickel Low  Rash           Current Medications (08/05/2022):  This is the current hospital active medication list Current Facility-Administered Medications  Medication Dose Route Frequency Provider Last Rate Last Admin   aspirin EC tablet 81 mg  81 mg Oral Daily Annita Brod, MD   81 mg at 08/05/22 [phone]  atorvastatin (LIPITOR) tablet 20 mg  20 mg Oral QHS Annita Brod, MD   20 mg at 08/04/22 2139   famotidine (PEPCID) tablet 20 mg  20 mg Oral Daily Annita Brod, MD   20 mg at 38/45/36 4680   folic acid (FOLVITE) tablet 1 mg  1 mg Oral Daily Annita Brod, MD   1 mg at 08/05/22 0951   furosemide (LASIX) tablet 20 mg  20 mg Oral Daily Annita Brod, MD   20 mg at 08/05/22 0951   insulin  aspart (novoLOG) injection 0-15 Units  0-15 Units Subcutaneous TID WC Gala Romney L, MD   8 Units at 08/05/22 0950   insulin aspart (novoLOG) injection 0-5 Units  0-5 Units Subcutaneous QHS Gala Romney L, MD       lactulose (CHRONULAC) 10 GM/15ML solution 30 g  30 g Oral TID Gala Romney L, MD   30 g at 08/05/22 0951   levothyroxine (SYNTHROID) tablet 100 mcg  100 mcg Oral Q0600 Annita Brod, MD   100 mcg at 08/05/22 0612   rifaximin (XIFAXAN) tablet 550 mg  550 mg Oral TID Annita Brod, MD   550 mg at 08/05/22 0951   traMADol (ULTRAM) tablet 50 mg  50 mg Oral Q6H PRN Annita Brod, MD         Discharge Medications: Please see discharge summary for a list of discharge medications.  Relevant Imaging Results:  Relevant Lab Results:   Additional Information SSN [id number]  Pete Pelt, RN

## 2022-08-05 NOTE — Progress Notes (Signed)
Mobility Specialist - Progress Note    08/05/22 0952  Mobility  Activity Ambulated independently in hallway;Stood at bedside;Dangled on edge of bed  Level of Assistance Independent  Assistive Device Front wheel walker  Distance Ambulated (ft) 80 ft  Activity Response Tolerated well  $Mobility charge 1 Mobility   Pt semi-supine in bed on RA upon arrival. Pt STS and ambulates in hallway indep. Pt returns to recliner with needs in reach, chair alarm set, and RN in room.   Gretchen Short  Mobility Specialist  08/05/22 9:53 AM

## 2022-10-21 ENCOUNTER — Emergency Department
Admission: EM | Admit: 2022-10-21 | Discharge: 2022-10-22 | Disposition: A | Discharge status: Skilled Nursing Facility | Payer: No Typology Code available for payment source | Attending: Emergency Medicine | Admitting: Emergency Medicine

## 2022-10-21 ENCOUNTER — Other Ambulatory Visit: Payer: Self-pay

## 2022-10-21 ENCOUNTER — Encounter: Payer: Self-pay | Admitting: Emergency Medicine

## 2022-10-21 DIAGNOSIS — T148XXA Other injury of unspecified body region, initial encounter: Secondary | ICD-10-CM

## 2022-10-21 DIAGNOSIS — Z85828 Personal history of other malignant neoplasm of skin: Secondary | ICD-10-CM | POA: Diagnosis not present

## 2022-10-21 DIAGNOSIS — D696 Thrombocytopenia, unspecified: Secondary | ICD-10-CM | POA: Insufficient documentation

## 2022-10-21 DIAGNOSIS — E119 Type 2 diabetes mellitus without complications: Secondary | ICD-10-CM | POA: Insufficient documentation

## 2022-10-21 DIAGNOSIS — Z794 Long term (current) use of insulin: Secondary | ICD-10-CM | POA: Insufficient documentation

## 2022-10-21 DIAGNOSIS — I10 Essential (primary) hypertension: Secondary | ICD-10-CM | POA: Insufficient documentation

## 2022-10-21 DIAGNOSIS — L7622 Postprocedural hemorrhage and hematoma of skin and subcutaneous tissue following other procedure: Secondary | ICD-10-CM | POA: Insufficient documentation

## 2022-10-21 LAB — BASIC METABOLIC PANEL
Anion gap: 8 (ref 5–15)
BUN: 18 mg/dL (ref 8–23)
CO2: 24 mmol/L (ref 22–32)
Calcium: 9.3 mg/dL (ref 8.9–10.3)
Chloride: 107 mmol/L (ref 98–111)
Creatinine, Ser: 1.14 mg/dL (ref 0.61–1.24)
GFR, Estimated: >60 mL/min (ref >60)
Glucose, Bld: 195 mg/dL — ABNORMAL HIGH (ref 70–99)
Potassium: 4 mmol/L (ref 3.5–5.1)
Sodium: 139 mmol/L (ref 135–145)

## 2022-10-21 LAB — TYPE AND SCREEN
ABO/RH(D): O NEG
Antibody Screen: NEGATIVE

## 2022-10-21 LAB — PROTIME-INR
INR: 1.3 — ABNORMAL HIGH (ref 0.8–1.2)
Prothrombin Time: 16.2 seconds — ABNORMAL HIGH (ref 11.4–15.2)

## 2022-10-21 MED ORDER — HYDROCODONE-ACETAMINOPHEN 5-325 MG PO TABS
1.0000 | ORAL_TABLET | Freq: Once | ORAL | Status: AC
  Administered 2022-10-22: 1 via ORAL
  Filled 2022-10-21: qty 1

## 2022-10-21 MED ORDER — SILVER NITRATE-POT NITRATE 75-25 % EX MISC
1.0000 | Freq: Once | CUTANEOUS | Status: AC
  Administered 2022-10-22: 1 via TOPICAL
  Filled 2022-10-21: qty 10

## 2022-10-21 MED ORDER — LIDOCAINE-EPINEPHRINE (PF) 2 %-1:200000 IJ SOLN
20.0000 mL | Freq: Once | INTRAMUSCULAR | Status: AC
  Administered 2022-10-22: 20 mL
  Filled 2022-10-21: qty 20

## 2022-10-21 MED ORDER — TRANEXAMIC ACID FOR EPISTAXIS
500.0000 mg | Freq: Once | TOPICAL | Status: AC
  Administered 2022-10-22: 500 mg via TOPICAL
  Filled 2022-10-21: qty 10

## 2022-10-21 MED ORDER — LIDOCAINE HCL (PF) 1 % IJ SOLN
INTRAMUSCULAR | Status: AC
  Filled 2022-10-21: qty 10

## 2022-10-21 MED ORDER — ONDANSETRON 4 MG PO TBDP
4.0000 mg | ORAL_TABLET | Freq: Once | ORAL | Status: AC
  Administered 2022-10-22: 4 mg via ORAL
  Filled 2022-10-21: qty 1

## 2022-10-21 NOTE — ED Triage Notes (Signed)
Pt arrived via [facility] from [facility]  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  Pt has hx of diabetes.

## 2022-10-21 NOTE — ED Provider Notes (Incomplete)
[facility] Provider Note    Event Date/Time   First MD Initiated Contact with Patient 10/21/22 2258     (approximate)   History   Bleeding to Right Ear   HPI  CADON RACZKA is a 76 y.o. male with history of cirrhosis secondary to NASH, diabetes who underwent Mohs x 2 dermatology at [facility] today.  States since being discharged back to [place] facility he has had continuous oozing of blood from the wounds.  She does have history of thrombocytopenia.  He is not on any antiplatelets or anticoagulants.   History provided by patient and daughter.    Past Medical History:  Diagnosis Date   Arthritis    Bleeding ulcer    Cancer ([place])    melanoma, carcinoma   Cirrhosis of liver ([place])    Diabetes mellitus without complication ([place])    Heart murmur    Hypertension    Iron deficiency anemia    NASH (nonalcoholic steatohepatitis)    Sleep apnea    Spleen enlarged    Thrombocytopenia ([place])     Past Surgical History:  Procedure Laterality Date   CHOLECYSTECTOMY     COLONOSCOPY N/A 07/23/2019   Procedure: COLONOSCOPY;  Surgeon: Lin Landsman, MD;  Location: [facility] ENDOSCOPY;  Service: Gastroenterology;  Laterality: N/A;   ESOPHAGOGASTRODUODENOSCOPY N/A 07/20/2019   Procedure: ESOPHAGOGASTRODUODENOSCOPY (EGD);  Surgeon: Lin Landsman, MD;  Location: [facility] ENDOSCOPY;  Service: Gastroenterology;  Laterality: N/A;   ESOPHAGOGASTRODUODENOSCOPY Left 08/20/2019   Procedure: ESOPHAGOGASTRODUODENOSCOPY (EGD);  Surgeon: Virgel Manifold, MD;  Location: [facility] ENDOSCOPY;  Service: Endoscopy;  Laterality: Left;   ESOPHAGOGASTRODUODENOSCOPY (EGD) WITH PROPOFOL N/A 04/25/2021   Procedure: ESOPHAGOGASTRODUODENOSCOPY (EGD) WITH PROPOFOL;  Surgeon: Lin Landsman, MD;  Location: [facility] ENDOSCOPY;  Service: Gastroenterology;  Laterality: N/A;   SKIN BIOPSY     TEE WITHOUT CARDIOVERSION N/A 12/23/2018   Procedure: TRANSESOPHAGEAL ECHOCARDIOGRAM (TEE);   Surgeon: Teodoro Spray, MD;  Location: [facility] ORS;  Service: Cardiovascular;  Laterality: N/A;   TEE WITHOUT CARDIOVERSION N/A 05/21/2022   Procedure: TRANSESOPHAGEAL ECHOCARDIOGRAM (TEE);  Surgeon: Minna Merritts, MD;  Location: [facility] ORS;  Service: Cardiovascular;  Laterality: N/A;    MEDICATIONS:  Prior to Admission medications   Medication Sig Start Date End Date Taking? Authorizing Provider  ASPERCREME LIDOCAINE 4 % LIQD Apply 1 Application topically daily as needed. 06/07/22   [provider]  aspirin EC 81 MG tablet Take 81 mg by mouth daily. Patient not taking: Reported on 08/04/2022    [provider]  atorvastatin (LIPITOR) 20 MG tablet Take 20 mg by mouth at bedtime.    [provider]  augmented betamethasone dipropionate (DIPROLENE-AF) 0.05 % cream Apply 1 application topically 2 (two) times daily.    [provider]  Cholecalciferol 50 MCG (2000 UT) TABS Take 1 tablet by mouth daily.    [provider]  famotidine (PEPCID) 20 MG tablet Take 20 mg by mouth daily.    [provider]  folic acid (FOLVITE) 1 MG tablet Take 1 tablet by mouth daily. 12/03/19   [provider]  furosemide (LASIX) 20 MG tablet Take 1 tablet (20 mg total) by mouth daily. 04/07/22   Lorella Nimrod, MD  insulin aspart (NOVOLOG) 100 UNIT/ML injection Inject 10 Units into the skin 3 (three) times daily before meals. Patient taking differently: Inject 16-22 Units into the skin 3 (three) times daily before meals. (Based on blood glucose reading) 04/26/21   Nita Sells,  MD  insulin glargine (LANTUS) 100 UNIT/ML injection Inject 0.14 mLs (14 Units total) into the skin daily. 08/05/22   Annita Brod, MD  ketotifen (ZADITOR) 0.025 % ophthalmic solution Place 1 drop into both eyes 2 (two) times daily.    [provider]  Lactobacillus Rhamnosus, GG, (RA PROBIOTIC DIGESTIVE CARE) CAPS Take 1 capsule by mouth daily.    [provider]  lactulose (CHRONULAC) 10 GM/15ML solution Take 45 mLs (30 g total) by mouth 2 (two) times daily. 08/05/22   Annita Brod, MD  levothyroxine (SYNTHROID) 100 MCG tablet Take 100 mcg by mouth daily before breakfast.    [provider]  lidocaine (LIDODERM) 5 % Place 2 patches onto the skin daily. Remove & Discard patch within 12 hours or as directed by MD 05/28/22   Enzo Bi, MD  magnesium oxide (MAG-OX) 400 MG tablet Take 2 tablets (800 mg total) by mouth 2 (two) times daily. 05/28/22   Enzo Bi, MD  Melatonin 10 MG TABS Take 10 mg by mouth at bedtime.    [provider]  Omega-3 Fatty Acids (FISH OIL) 1000 MG CAPS Take 1 capsule by mouth 2 (two) times daily.    [provider]  pantoprazole (PROTONIX) 40 MG tablet Take 1 tablet (40 mg total) by mouth 2 (two) times daily. 04/07/22 06/06/22  Lorella Nimrod, MD  rifaximin (XIFAXAN) 550 MG TABS tablet Take 550 mg by mouth 2 (two) times daily. Takes with lactulose    [provider]  Semaglutide,0.25 or 0.5MG/DOS, 2 MG/1.5ML SOPN Inject 0.5 mg into the skin once a week.    [provider]  terazosin (HYTRIN) 5 MG capsule Take 5 mg by mouth at bedtime.    [provider]  trolamine salicylate (ASPERCREME) 10 % cream Apply topically as needed for muscle pain. 05/28/22   Enzo Bi, MD  vitamin C (ASCORBIC ACID) 500 MG tablet Take 1,000 mg by mouth daily. Patient not taking: Reported on 08/04/2022    [provider]    Physical Exam   Triage Vital Signs: ED Triage Vitals  Enc Vitals Group     BP 10/21/22 2240 137/67     Pulse Rate 10/21/22 2240 63     Resp 10/21/22 2240 18     Temp 10/21/22 2240 98.4 F (36.9 C)     Temp Source 10/21/22 2240 Oral     SpO2 10/21/22 2240 99 %     Weight 10/21/22 2239 228 lb (103.4 kg)     Height 10/21/22 2239 6' (1.829 m)     Head Circumference --      Peak Flow --      Pain Score --      Pain Loc --      Pain Edu? --      Excl. in [place]? --      Most recent vital signs: Vitals:   10/22/22 0430 10/22/22 0445  BP: 129/63 125/80  Pulse: (!) 59 (!) 59  Resp: 18   Temp: 98.1 F (36.7 C)   SpO2: 97% 96%    CONSTITUTIONAL: Alert and oriented and responds appropriately to questions.  Drooling, no distress HEAD: Normocephalic, atraumatic EYES: Conjunctivae clear, pupils appear equal, sclera nonicteric ENT: normal nose; moist mucous membranes.  Incision sites, sutures noted to the ER in parietal area on intact but he has multiple areas of oozing dark red blood.  He has packing noted both above the helix and posteriorly and inferiorly into the  pinna.  He has oozing coming from this packing. NECK: Supple, normal ROM CARD: RRR; S1 and S2 appreciated; no murmurs, no clicks, no rubs, no gallops RESP: Normal chest excursion without splinting or tachypnea; breath sounds clear and equal bilaterally; no wheezes, no rhonchi, no rales, no hypoxia or respiratory distress, speaking full sentences ABD/GI: Normal bowel sounds; non-distended; soft, non-tender, no rebound, no guarding, no peritoneal signs BACK: The back appears normal EXT: Normal ROM in all joints; no deformity noted, no edema; no cyanosis SKIN: Normal color for age and race; warm; no rash on exposed skin NEURO: Moves all extremities equally, normal speech PSYCH: The patient's mood and manner are appropriate.   ED Results / Procedures / Treatments   LABS: (all labs ordered are listed, but only abnormal results are displayed) Labs Reviewed  BASIC METABOLIC PANEL - Abnormal; Notable for the following components:      Result Value   Glucose, Bld 195 (*)    All other components within normal limits  CBC WITH DIFFERENTIAL/PLATELET - Abnormal; Notable for the following components:   WBC 3.6 (*)    RBC 3.73 (*)    Hemoglobin 12.0 (*)    HCT 34.2 (*)    RDW 16.1 (*)    Platelets 45 (*)    All other components within normal limits  PROTIME-INR - Abnormal; Notable for the  following components:   Prothrombin Time 16.2 (*)    INR 1.3 (*)    All other components within normal limits  CBG MONITORING, ED - Abnormal; Notable for the following components:   Glucose-Capillary 172 (*)    All other components within normal limits  TYPE AND SCREEN  PREPARE PLATELET PHERESIS     EKG:    Date: 10/22/2022 6:30 AM  Rate: 62  Rhythm: normal sinus rhythm  QRS Axis: normal  Intervals: normal  ST/T Wave abnormalities: normal  Conduction Disutrbances: none  Narrative Interpretation: unremarkable     RADIOLOGY: My personal review and interpretation of imaging:    I have personally reviewed all radiology reports.   No results found.   PROCEDURES:  Critical Care performed: Yes, see critical care procedure note(s)   CRITICAL CARE Performed by: Pryor Curia   Total critical care time: 45 minutes  Critical care time was exclusive of separately billable procedures and treating other patients.  Critical care was necessary to treat or prevent imminent or life-threatening deterioration.  Critical care was time spent personally by me on the following activities: development of treatment plan with patient and/or surrogate as well as nursing, discussions with consultants, evaluation of patient's response to treatment, examination of patient, obtaining history from patient or surrogate, ordering and performing treatments and interventions, ordering and review of laboratory studies, ordering and review of radiographic studies, pulse oximetry and re-evaluation of patient's condition.   Procedures    IMPRESSION / MDM / ASSESSMENT AND PLAN / ED COURSE  I reviewed the triage vital signs and the nursing notes.    Patient with postoperative bleeding.  History of thrombocytopenia.  The patient is on the cardiac monitor to evaluate for evidence of arrhythmia and/or significant heart rate changes.   DIFFERENTIAL DIAGNOSIS (includes but not limited to):    Postoperative bleeding, thrombocytopenia, coagulopathy, no signs of infection or wound dehiscence   Patient's presentation is most consistent with acute presentation with potential threat to life or bodily function.   PLAN: Labs obtained from triage.  Platelet count of 45,000.  INR is 1.3.  Hemoglobin is stable  at 12.  I have attempted to stop bleeding with direct pressure, lidocaine with epinephrine without much success.  We will transfuse 1 unit of platelets while applying TXA soaked gauze and a pressure dressing.   MEDICATIONS GIVEN IN ED: Medications  lidocaine (PF) (XYLOCAINE) 1 % injection (  Canceled Entry 10/22/22 0034)  oxymetazoline (AFRIN) 0.05 % nasal spray 1 spray (has no administration in time range)  tranexamic acid (CYKLOKAPRON) 1000 MG/10ML topical solution 500 mg (has no administration in time range)  tranexamic acid (CYKLOKAPRON) 1000 MG/10ML topical solution 500 mg (500 mg Topical Given 10/22/22 0001)  silver nitrate applicators applicator 1 Application (1 Application Topical Given 10/22/22 0001)  lidocaine-EPINEPHrine (XYLOCAINE W/EPI) 2 %-1:200000 (PF) injection 20 mL (20 mLs Other Given 10/22/22 0008)  HYDROcodone-acetaminophen (NORCO/VICODIN) 5-325 MG per tablet 1 tablet (1 tablet Oral Given 10/22/22 0001)  ondansetron (ZOFRAN-ODT) disintegrating tablet 4 mg (4 mg Oral Given 10/22/22 0001)  0.9 %  sodium chloride infusion (Manually program via Guardrails IV Fluids) ( Intravenous New Bag/Given 10/22/22 0103)  tranexamic acid (CYKLOKAPRON) 1000 MG/10ML topical solution 500 mg (500 mg Topical Given by Other 10/22/22 0404)  oxymetazoline (AFRIN) 0.05 % nasal spray 1 spray (1 spray Each Nare Given 10/22/22 0450)  EPINEPHrine (ADRENALIN) 3 mg (3 mg Subcutaneous Given 10/22/22 0450)  fentaNYL (SUBLIMAZE) injection 50 mcg (50 mcg Intravenous Given 10/22/22 0504)     ED COURSE: Patient continues to bleed despite being transfused and having a pressure dressing on for over an hour.  We  discussed risk and benefits of removing the Surgicel packing that was placed by dermatology but discussed with patient that I am not able to see what is actively bleeding without removing this packing.  Initially started by removing the packing to the superior portion just behind the helix.  He has several small arterial bleeds.  I was able to manage these with silver nitrate and trephination but he has another area of bleeding that I was not able to get this to stop.  I did try to place a figure-of-eight suture but unfortunately this was not successful either.  Prior to injecting lido with epi in this area without success.  Tried using Avitene, surgicel, combat guaze soaked with TXA and another pressure dressing for 30+ minutes.  Discussed with Dr. Tami Ribas with ENT who recommends trying cotton balls soaked in 1:1000 EPI, Afrin soaked gauze and surgicel with pressure dressing.  Family agrees to try this.  We are also trying to get a Glascott dressing from the OR.  If this is unsuccessful, Dr. Tami Ribas recommends consultation with the dermatologist at [facility] to perform his procedure.  Family did not contact them to coming into the [facility] ED.  Patient remains hemodynamically stable.  He has had approximately 200 mL estimated blood loss.   6:15 AM  Pt's wounds are no longer bleeding.  Have left the EPI soaked cotton ball, Surgicel in place and will reapply TXA and Afrin soaked gauze and pressure dressing to the area.  They will call Dr. Lacinda Axon in the morning for close outpatient follow-up next 24 to 48 hours.  Encouraged him to continue his antibiotics and pain medication as prescribed and discussed bleeding return precautions.  He continues to be hemodynamically stable.  6:31 AM  I was alerted by the charge nurse that patient's pulse ox was picking up a pulse of 34.  However when placed on the monitor the pulse ox is incorrect and the heart rate is double this.  Pulse ox  only reporting every other beat when calculating  the pulse.  Heart rate of 68.  No chest pain, shortness of breath, dizziness and normal blood pressure.   CONSULTS: Dr. Tami Ribas with ENT consulted.  Appreciate his help.  He agrees that epinephrine soaked cottonball can be left in place at this time with pressure dressing and recommends that he follow-up with his dermatologist Dr. Lacinda Axon today.  Discussed this with patient and daughter at length and they agree.   OUTSIDE RECORDS REVIEWED: Reviewed dermatology notes from [place] today.       FINAL CLINICAL IMPRESSION(S) / ED DIAGNOSES   Final diagnoses:  Bleeding from wound  Thrombocytopenia ([place])     Rx / DC Orders   ED Discharge Orders     None        Note:  This document was prepared using Dragon voice recognition software and may include unintentional dictation errors.      Maliya Marich, Delice Bison, DO 10/22/22 [phone]

## 2022-10-22 LAB — CBC WITH DIFFERENTIAL/PLATELET
Abs Immature Granulocytes: 0.01 10*3/uL (ref 0.00–0.07)
Basophils Absolute: 0 10*3/uL (ref 0.0–0.1)
Basophils Relative: 1 %
Eosinophils Absolute: 0.1 10*3/uL (ref 0.0–0.5)
Eosinophils Relative: 3 %
HCT: 34.2 % — ABNORMAL LOW (ref 39.0–52.0)
Hemoglobin: 12 g/dL — ABNORMAL LOW (ref 13.0–17.0)
Immature Granulocytes: 0 %
Lymphocytes Relative: 28 %
Lymphs Abs: 1 10*3/uL (ref 0.7–4.0)
MCH: 32.2 pg (ref 26.0–34.0)
MCHC: 35.1 g/dL (ref 30.0–36.0)
MCV: 91.7 fL (ref 80.0–100.0)
Monocytes Absolute: 0.5 10*3/uL (ref 0.1–1.0)
Monocytes Relative: 14 %
Neutro Abs: 1.9 10*3/uL (ref 1.7–7.7)
Neutrophils Relative %: 54 %
Platelets: 45 10*3/uL — ABNORMAL LOW (ref 150–400)
RBC: 3.73 MIL/uL — ABNORMAL LOW (ref 4.22–5.81)
RDW: 16.1 % — ABNORMAL HIGH (ref 11.5–15.5)
Smear Review: NORMAL
WBC: 3.6 10*3/uL — ABNORMAL LOW (ref 4.0–10.5)
nRBC: 0 % (ref 0.0–0.2)

## 2022-10-22 LAB — CBG MONITORING, ED: Glucose-Capillary: 172 mg/dL — ABNORMAL HIGH (ref 70–99)

## 2022-10-22 MED ORDER — FENTANYL CITRATE PF 50 MCG/ML IJ SOSY
50.0000 ug | PREFILLED_SYRINGE | Freq: Once | INTRAMUSCULAR | Status: AC
  Administered 2022-10-22: 50 ug via INTRAVENOUS
  Filled 2022-10-22: qty 1

## 2022-10-22 MED ORDER — OXYMETAZOLINE HCL 0.05 % NA SOLN
1.0000 | Freq: Once | NASAL | Status: AC
  Administered 2022-10-22: 1 via NASAL
  Filled 2022-10-22: qty 30

## 2022-10-22 MED ORDER — TRANEXAMIC ACID FOR EPISTAXIS
500.0000 mg | Freq: Once | TOPICAL | Status: AC
  Administered 2022-10-22: 500 mg via TOPICAL
  Filled 2022-10-22: qty 10

## 2022-10-22 MED ORDER — EPINEPHRINE PF 1 MG/ML IJ SOLN
3.0000 mg | Freq: Once | INTRAMUSCULAR | Status: AC
  Administered 2022-10-22: 3 mg via SUBCUTANEOUS
  Filled 2022-10-22: qty 3

## 2022-10-22 MED ORDER — SODIUM CHLORIDE 0.9% IV SOLUTION
Freq: Once | INTRAVENOUS | Status: AC
  Filled 2022-10-22: qty 250

## 2022-10-22 NOTE — Discharge Instructions (Addendum)
Please call Dr. Lacinda Axon today for follow-up.  I recommend that you be seen in the office in the next 24 - 48 hours.  Continue your antibiotics and pain medication as prescribed.  Please keep your pressure dressing in place for the next 24 hours.  You begin bleeding again, I recommend close follow-up at [facility].

## 2022-10-23 LAB — BPAM PLATELET PHERESIS
Blood Product Expiration Date: 202311082359
ISSUE DATE / TIME: 202311070047
Unit Type and Rh: 5100

## 2022-10-23 LAB — PREPARE PLATELET PHERESIS: Unit division: 0

## 2023-04-05 ENCOUNTER — Inpatient Hospital Stay: Payer: No Typology Code available for payment source

## 2023-04-05 ENCOUNTER — Emergency Department: Payer: No Typology Code available for payment source

## 2023-04-05 ENCOUNTER — Inpatient Hospital Stay
Admission: EM | Admit: 2023-04-05 | Discharge: 2023-04-17 | DRG: 872 | Disposition: A | Discharge status: Skilled Nursing Facility | Payer: No Typology Code available for payment source | Source: Skilled Nursing Facility | Attending: Internal Medicine | Admitting: Internal Medicine

## 2023-04-05 ENCOUNTER — Other Ambulatory Visit: Payer: Self-pay

## 2023-04-05 DIAGNOSIS — R54 Age-related physical debility: Secondary | ICD-10-CM | POA: Diagnosis present

## 2023-04-05 DIAGNOSIS — I110 Hypertensive heart disease with heart failure: Secondary | ICD-10-CM | POA: Diagnosis present

## 2023-04-05 DIAGNOSIS — D61818 Other pancytopenia: Secondary | ICD-10-CM | POA: Diagnosis present

## 2023-04-05 DIAGNOSIS — Z993 Dependence on wheelchair: Secondary | ICD-10-CM

## 2023-04-05 DIAGNOSIS — I5032 Chronic diastolic (congestive) heart failure: Secondary | ICD-10-CM | POA: Diagnosis present

## 2023-04-05 DIAGNOSIS — R6520 Severe sepsis without septic shock: Secondary | ICD-10-CM

## 2023-04-05 DIAGNOSIS — Z888 Allergy status to other drugs, medicaments and biological substances status: Secondary | ICD-10-CM

## 2023-04-05 DIAGNOSIS — A408 Other streptococcal sepsis: Secondary | ICD-10-CM | POA: Diagnosis present

## 2023-04-05 DIAGNOSIS — M19011 Primary osteoarthritis, right shoulder: Secondary | ICD-10-CM | POA: Diagnosis present

## 2023-04-05 DIAGNOSIS — R7881 Bacteremia: Secondary | ICD-10-CM

## 2023-04-05 DIAGNOSIS — K7469 Other cirrhosis of liver: Secondary | ICD-10-CM | POA: Diagnosis present

## 2023-04-05 DIAGNOSIS — E039 Hypothyroidism, unspecified: Secondary | ICD-10-CM | POA: Diagnosis present

## 2023-04-05 DIAGNOSIS — B955 Unspecified streptococcus as the cause of diseases classified elsewhere: Secondary | ICD-10-CM | POA: Diagnosis not present

## 2023-04-05 DIAGNOSIS — K047 Periapical abscess without sinus: Secondary | ICD-10-CM | POA: Diagnosis present

## 2023-04-05 DIAGNOSIS — K7682 Hepatic encephalopathy: Secondary | ICD-10-CM | POA: Diagnosis present

## 2023-04-05 DIAGNOSIS — K029 Dental caries, unspecified: Secondary | ICD-10-CM | POA: Diagnosis not present

## 2023-04-05 DIAGNOSIS — K7581 Nonalcoholic steatohepatitis (NASH): Secondary | ICD-10-CM | POA: Diagnosis present

## 2023-04-05 DIAGNOSIS — E872 Acidosis, unspecified: Secondary | ICD-10-CM | POA: Diagnosis present

## 2023-04-05 DIAGNOSIS — I10 Essential (primary) hypertension: Secondary | ICD-10-CM | POA: Diagnosis present

## 2023-04-05 DIAGNOSIS — R338 Other retention of urine: Secondary | ICD-10-CM | POA: Diagnosis present

## 2023-04-05 DIAGNOSIS — G8929 Other chronic pain: Secondary | ICD-10-CM | POA: Diagnosis present

## 2023-04-05 DIAGNOSIS — I8510 Secondary esophageal varices without bleeding: Secondary | ICD-10-CM | POA: Diagnosis present

## 2023-04-05 DIAGNOSIS — Z9049 Acquired absence of other specified parts of digestive tract: Secondary | ICD-10-CM

## 2023-04-05 DIAGNOSIS — I361 Nonrheumatic tricuspid (valve) insufficiency: Secondary | ICD-10-CM | POA: Diagnosis not present

## 2023-04-05 DIAGNOSIS — A419 Sepsis, unspecified organism: Secondary | ICD-10-CM | POA: Diagnosis present

## 2023-04-05 DIAGNOSIS — K7460 Unspecified cirrhosis of liver: Secondary | ICD-10-CM | POA: Diagnosis not present

## 2023-04-05 DIAGNOSIS — E119 Type 2 diabetes mellitus without complications: Secondary | ICD-10-CM | POA: Diagnosis present

## 2023-04-05 DIAGNOSIS — Z85820 Personal history of malignant melanoma of skin: Secondary | ICD-10-CM

## 2023-04-05 DIAGNOSIS — Z87891 Personal history of nicotine dependence: Secondary | ICD-10-CM

## 2023-04-05 DIAGNOSIS — Z1152 Encounter for screening for COVID-19: Secondary | ICD-10-CM

## 2023-04-05 DIAGNOSIS — Z66 Do not resuscitate: Secondary | ICD-10-CM | POA: Diagnosis present

## 2023-04-05 DIAGNOSIS — K766 Portal hypertension: Secondary | ICD-10-CM | POA: Diagnosis present

## 2023-04-05 DIAGNOSIS — Z79899 Other long term (current) drug therapy: Secondary | ICD-10-CM

## 2023-04-05 DIAGNOSIS — M25511 Pain in right shoulder: Secondary | ICD-10-CM | POA: Diagnosis present

## 2023-04-05 DIAGNOSIS — R4182 Altered mental status, unspecified: Secondary | ICD-10-CM | POA: Diagnosis present

## 2023-04-05 DIAGNOSIS — Z7985 Long-term (current) use of injectable non-insulin antidiabetic drugs: Secondary | ICD-10-CM

## 2023-04-05 DIAGNOSIS — E663 Overweight: Secondary | ICD-10-CM | POA: Diagnosis present

## 2023-04-05 DIAGNOSIS — Z886 Allergy status to analgesic agent status: Secondary | ICD-10-CM

## 2023-04-05 DIAGNOSIS — M4854XA Collapsed vertebra, not elsewhere classified, thoracic region, initial encounter for fracture: Secondary | ICD-10-CM | POA: Diagnosis present

## 2023-04-05 DIAGNOSIS — Z7982 Long term (current) use of aspirin: Secondary | ICD-10-CM

## 2023-04-05 DIAGNOSIS — Z96612 Presence of left artificial shoulder joint: Secondary | ICD-10-CM | POA: Diagnosis present

## 2023-04-05 DIAGNOSIS — Z953 Presence of xenogenic heart valve: Secondary | ICD-10-CM

## 2023-04-05 DIAGNOSIS — Z79890 Hormone replacement therapy (postmenopausal): Secondary | ICD-10-CM

## 2023-04-05 DIAGNOSIS — Z794 Long term (current) use of insulin: Secondary | ICD-10-CM | POA: Diagnosis not present

## 2023-04-05 DIAGNOSIS — N401 Enlarged prostate with lower urinary tract symptoms: Secondary | ICD-10-CM | POA: Diagnosis present

## 2023-04-05 DIAGNOSIS — I77810 Thoracic aortic ectasia: Secondary | ICD-10-CM | POA: Diagnosis present

## 2023-04-05 DIAGNOSIS — Z91040 Latex allergy status: Secondary | ICD-10-CM

## 2023-04-05 DIAGNOSIS — E785 Hyperlipidemia, unspecified: Secondary | ICD-10-CM | POA: Diagnosis present

## 2023-04-05 DIAGNOSIS — M545 Low back pain, unspecified: Secondary | ICD-10-CM | POA: Diagnosis present

## 2023-04-05 DIAGNOSIS — R5381 Other malaise: Secondary | ICD-10-CM | POA: Diagnosis present

## 2023-04-05 DIAGNOSIS — Z8249 Family history of ischemic heart disease and other diseases of the circulatory system: Secondary | ICD-10-CM

## 2023-04-05 LAB — CBC WITH DIFFERENTIAL/PLATELET
Abs Immature Granulocytes: 0 10*3/uL (ref 0.00–0.07)
Basophils Absolute: 0 10*3/uL (ref 0.0–0.1)
Basophils Relative: 1 %
Eosinophils Absolute: 0 10*3/uL (ref 0.0–0.5)
Eosinophils Relative: 1 %
HCT: 37.4 % — ABNORMAL LOW (ref 39.0–52.0)
Hemoglobin: 13.1 g/dL (ref 13.0–17.0)
Immature Granulocytes: 0 %
Lymphocytes Relative: 11 %
Lymphs Abs: 0.4 10*3/uL — ABNORMAL LOW (ref 0.7–4.0)
MCH: 32.6 pg (ref 26.0–34.0)
MCHC: 35 g/dL (ref 30.0–36.0)
MCV: 93 fL (ref 80.0–100.0)
Monocytes Absolute: 0.4 10*3/uL (ref 0.1–1.0)
Monocytes Relative: 12 %
Neutro Abs: 2.9 10*3/uL (ref 1.7–7.7)
Neutrophils Relative %: 75 %
Platelets: 37 10*3/uL — ABNORMAL LOW (ref 150–400)
RBC: 4.02 MIL/uL — ABNORMAL LOW (ref 4.22–5.81)
RDW: 15.7 % — ABNORMAL HIGH (ref 11.5–15.5)
Smear Review: NORMAL
WBC: 3.8 10*3/uL — ABNORMAL LOW (ref 4.0–10.5)
nRBC: 0 % (ref 0.0–0.2)

## 2023-04-05 LAB — ETHANOL: Alcohol, Ethyl (B): <10 mg/dL (ref <10)

## 2023-04-05 LAB — COMPREHENSIVE METABOLIC PANEL
ALT: 33 U/L (ref 0–44)
AST: 53 U/L — ABNORMAL HIGH (ref 15–41)
Albumin: 3.2 g/dL — ABNORMAL LOW (ref 3.5–5.0)
Alkaline Phosphatase: 95 U/L (ref 38–126)
Anion gap: 8 (ref 5–15)
BUN: 14 mg/dL (ref 8–23)
CO2: 22 mmol/L (ref 22–32)
Calcium: 9.1 mg/dL (ref 8.9–10.3)
Chloride: 107 mmol/L (ref 98–111)
Creatinine, Ser: 1.14 mg/dL (ref 0.61–1.24)
GFR, Estimated: >60 mL/min (ref >60)
Glucose, Bld: 122 mg/dL — ABNORMAL HIGH (ref 70–99)
Potassium: 3.8 mmol/L (ref 3.5–5.1)
Sodium: 137 mmol/L (ref 135–145)
Total Bilirubin: 2.8 mg/dL — ABNORMAL HIGH (ref 0.3–1.2)
Total Protein: 6.6 g/dL (ref 6.5–8.1)

## 2023-04-05 LAB — PROCALCITONIN: Procalcitonin: 0.15 ng/mL

## 2023-04-05 LAB — TROPONIN I (HIGH SENSITIVITY)
Troponin I (High Sensitivity): 12 ng/L (ref <18)
Troponin I (High Sensitivity): 15 ng/L (ref <18)

## 2023-04-05 LAB — RESP PANEL BY RT-PCR (RSV, FLU A&B, COVID)  RVPGX2
Influenza A by PCR: NEGATIVE
Influenza B by PCR: NEGATIVE
Resp Syncytial Virus by PCR: NEGATIVE
SARS Coronavirus 2 by RT PCR: NEGATIVE

## 2023-04-05 LAB — LACTIC ACID, PLASMA
Lactic Acid, Venous: 2.1 mmol/L (ref 0.5–1.9)
Lactic Acid, Venous: 1.7 mmol/L (ref 0.5–1.9)

## 2023-04-05 LAB — CK: Total CK: 114 U/L (ref 49–397)

## 2023-04-05 LAB — AMMONIA: Ammonia: 71 umol/L — ABNORMAL HIGH (ref 9–35)

## 2023-04-05 LAB — CBG MONITORING, ED: Glucose-Capillary: 119 mg/dL — ABNORMAL HIGH (ref 70–99)

## 2023-04-05 LAB — MAGNESIUM: Magnesium: 1.6 mg/dL — ABNORMAL LOW (ref 1.7–2.4)

## 2023-04-05 MED ORDER — ONDANSETRON HCL 4 MG PO TABS: 4.0000 mg | ORAL_TABLET | Freq: Four times a day (QID) | ORAL | Status: AC | PRN

## 2023-04-05 MED ORDER — ACETAMINOPHEN 10 MG/ML IV SOLN
1000.0000 mg | Freq: Four times a day (QID) | INTRAVENOUS | Status: AC | PRN
  Administered 2023-04-05: 1000 mg via INTRAVENOUS
  Filled 2023-04-05: qty 100

## 2023-04-05 MED ORDER — MELATONIN 5 MG PO TABS
10.0000 mg | ORAL_TABLET | Freq: Every day | ORAL | Status: DC
  Administered 2023-04-06 – 2023-04-16 (×12): 10 mg via ORAL
  Filled 2023-04-05 (×12): qty 2

## 2023-04-05 MED ORDER — LACTULOSE 10 GM/15ML PO SOLN
30.0000 g | Freq: Two times a day (BID) | ORAL | Status: DC
  Administered 2023-04-06 – 2023-04-10 (×9): 30 g via ORAL
  Filled 2023-04-05 (×10): qty 60

## 2023-04-05 MED ORDER — ONDANSETRON HCL 4 MG/2ML IJ SOLN: 4.0000 mg | Freq: Four times a day (QID) | INTRAMUSCULAR | Status: AC | PRN

## 2023-04-05 MED ORDER — VANCOMYCIN HCL IN DEXTROSE 1-5 GM/200ML-% IV SOLN
1000.0000 mg | Freq: Once | INTRAVENOUS | Status: AC
  Administered 2023-04-05: 1000 mg via INTRAVENOUS
  Filled 2023-04-05: qty 200

## 2023-04-05 MED ORDER — AQUAPHOR EX OINT
TOPICAL_OINTMENT | Freq: Two times a day (BID) | CUTANEOUS | Status: AC
  Filled 2023-04-05: qty 50

## 2023-04-05 MED ORDER — FAMOTIDINE 20 MG PO TABS
20.0000 mg | ORAL_TABLET | Freq: Every day | ORAL | Status: DC
  Administered 2023-04-06 – 2023-04-17 (×12): 20 mg via ORAL
  Filled 2023-04-05 (×12): qty 1

## 2023-04-05 MED ORDER — VANCOMYCIN HCL 2000 MG/400ML IV SOLN: 2000.0000 mg | INTRAVENOUS | Status: DC

## 2023-04-05 MED ORDER — IOHEXOL 300 MG/ML  SOLN
100.0000 mL | Freq: Once | INTRAMUSCULAR | Status: AC | PRN
  Administered 2023-04-05: 100 mL via INTRAVENOUS

## 2023-04-05 MED ORDER — ACETAMINOPHEN 650 MG RE SUPP: 650.0000 mg | Freq: Four times a day (QID) | RECTAL | Status: AC | PRN

## 2023-04-05 MED ORDER — RISAQUAD PO CAPS
1.0000 | ORAL_CAPSULE | Freq: Every day | ORAL | Status: DC
  Administered 2023-04-06 – 2023-04-17 (×12): 1 via ORAL
  Filled 2023-04-05 (×12): qty 1

## 2023-04-05 MED ORDER — FOLIC ACID 1 MG PO TABS
1.0000 mg | ORAL_TABLET | Freq: Every day | ORAL | Status: DC
  Administered 2023-04-06 – 2023-04-17 (×13): 1 mg via ORAL
  Filled 2023-04-05 (×13): qty 1

## 2023-04-05 MED ORDER — RIFAXIMIN 550 MG PO TABS
550.0000 mg | ORAL_TABLET | Freq: Two times a day (BID) | ORAL | Status: DC
  Administered 2023-04-06 – 2023-04-17 (×23): 550 mg via ORAL
  Filled 2023-04-05 (×25): qty 1

## 2023-04-05 MED ORDER — PANTOPRAZOLE SODIUM 40 MG PO TBEC
40.0000 mg | DELAYED_RELEASE_TABLET | Freq: Two times a day (BID) | ORAL | Status: DC
  Administered 2023-04-06 – 2023-04-17 (×24): 40 mg via ORAL
  Filled 2023-04-05 (×24): qty 1

## 2023-04-05 MED ORDER — MAGNESIUM SULFATE IN D5W 1-5 GM/100ML-% IV SOLN
1.0000 g | Freq: Once | INTRAVENOUS | Status: DC
  Filled 2023-04-05: qty 100

## 2023-04-05 MED ORDER — MAGNESIUM OXIDE 400 MG PO TABS
800.0000 mg | ORAL_TABLET | Freq: Two times a day (BID) | ORAL | Status: DC
  Administered 2023-04-06 – 2023-04-17 (×23): 800 mg via ORAL
  Filled 2023-04-05 (×46): qty 2

## 2023-04-05 MED ORDER — VANCOMYCIN HCL 1500 MG/300ML IV SOLN
1500.0000 mg | Freq: Once | INTRAVENOUS | Status: AC
  Administered 2023-04-06: 1500 mg via INTRAVENOUS
  Filled 2023-04-05: qty 300

## 2023-04-05 MED ORDER — SODIUM CHLORIDE 0.9 % IV BOLUS
1000.0000 mL | Freq: Once | INTRAVENOUS | Status: AC
  Administered 2023-04-05: 1000 mL via INTRAVENOUS

## 2023-04-05 MED ORDER — SENNOSIDES-DOCUSATE SODIUM 8.6-50 MG PO TABS: 1.0000 | ORAL_TABLET | Freq: Every evening | ORAL | Status: DC | PRN

## 2023-04-05 MED ORDER — ACETAMINOPHEN 325 MG PO TABS
650.0000 mg | ORAL_TABLET | Freq: Four times a day (QID) | ORAL | Status: AC | PRN
  Administered 2023-04-06 – 2023-04-07 (×2): 650 mg via ORAL
  Filled 2023-04-05 (×2): qty 2

## 2023-04-05 MED ORDER — LACTULOSE 10 GM/15ML PO SOLN
30.0000 g | Freq: Once | ORAL | Status: AC
  Administered 2023-04-09: 30 g via ORAL
  Filled 2023-04-05: qty 60

## 2023-04-05 MED ORDER — MAGNESIUM SULFATE 2 GM/50ML IV SOLN
2.0000 g | Freq: Once | INTRAVENOUS | Status: AC
  Administered 2023-04-05: 2 g via INTRAVENOUS
  Filled 2023-04-05: qty 50

## 2023-04-05 MED ORDER — ATORVASTATIN CALCIUM 20 MG PO TABS
20.0000 mg | ORAL_TABLET | Freq: Every day | ORAL | Status: DC
  Administered 2023-04-06 – 2023-04-11 (×7): 20 mg via ORAL
  Filled 2023-04-05 (×7): qty 1

## 2023-04-05 MED ORDER — LEVOTHYROXINE SODIUM 100 MCG PO TABS
100.0000 ug | ORAL_TABLET | Freq: Every day | ORAL | Status: DC
  Administered 2023-04-06 – 2023-04-17 (×11): 100 ug via ORAL
  Filled 2023-04-05 (×7): qty 1
  Filled 2023-04-05: qty 2
  Filled 2023-04-05 (×3): qty 1

## 2023-04-05 MED ORDER — LACTATED RINGERS IV BOLUS
1000.0000 mL | Freq: Once | INTRAVENOUS | Status: AC
  Administered 2023-04-05: 1000 mL via INTRAVENOUS

## 2023-04-05 MED ORDER — SODIUM CHLORIDE 0.9 % IV SOLN
2.0000 g | Freq: Three times a day (TID) | INTRAVENOUS | Status: DC
  Administered 2023-04-05 – 2023-04-06 (×2): 2 g via INTRAVENOUS
  Filled 2023-04-05 (×4): qty 12.5

## 2023-04-05 MED ORDER — VITAMIN C 500 MG PO TABS
1000.0000 mg | ORAL_TABLET | Freq: Every day | ORAL | Status: DC
  Administered 2023-04-07 – 2023-04-17 (×11): 1000 mg via ORAL
  Filled 2023-04-05 (×12): qty 2

## 2023-04-05 MED ORDER — TROLAMINE SALICYLATE 10 % EX CREA
TOPICAL_CREAM | CUTANEOUS | Status: DC | PRN
  Filled 2023-04-05: qty 85

## 2023-04-05 NOTE — Assessment & Plan Note (Signed)
Levothyroxine 100 mcg daily before breakfast resumed 

## 2023-04-05 NOTE — Assessment & Plan Note (Signed)
Resumed home rifaximin 550 mg p.o. twice daily, lactulose 30 g p.o. twice daily

## 2023-04-05 NOTE — ED Provider Notes (Signed)
[facility] Provider Note    Event Date/Time   First MD Initiated Contact with Patient 04/05/23 1618     (approximate)   History   Altered Mental Status   HPI  Joshua Berry is a 77 y.o. male   Past medical history of liver cirrhosis, nonalcoholic steatohepatitis, diabetes, hypertension, iron deficient anemia, CHF who presents the emergency department with altered mental status.  According to his wife he became progressively altered throughout the day and afternoon yesterday.  No recent illnesses, trauma, other acute medical complaints.  Last night given his altered mental status did not take his lactulose.  The patient is disoriented to situation but oriented to self.  He has long pauses between answering questions and at times does not answer me.  Follows commands.    Independent Historian contributed to assessment above: His wife over the telephone, [facility]  External Medical Documents Reviewed: Emergency department visit dated August 2023 for chest pain and fatigue ultimately discharged after unremarkable workup      Physical Exam   Triage Vital Signs: ED Triage Vitals  Enc Vitals Group     BP --      Pulse Rate 04/05/23 1621 84     Resp 04/05/23 1621 20     Temp 04/05/23 1621 100 F (37.8 C)     Temp Source 04/05/23 1621 Oral     SpO2 04/05/23 1621 98 %     Weight 04/05/23 1628 230 lb 3.2 oz (104.4 kg)     Height 04/05/23 1628  (1.88 m)     Head Circumference --      Peak Flow --      Pain Score --      Pain Loc --      Pain Edu? --      Excl. in [place]? --     Most recent vital signs: Vitals:   04/05/23 1621 04/05/23 1625  BP:  (!) 123/57  Pulse: 84 91  Resp: 20   Temp: 100 F (37.8 C)   SpO2: 98% 98%    General: Awake, no distress.  CV:  Good peripheral perfusion.  Resp:  Normal effort.  Abd:  No distention.  Other:  Mild jaundice.  No scleral icterus.  +Asterixis.  Neck supple with full range of motion no meningismus.   No signs of head trauma.  Abdomen soft and nontender and lungs are clear.  He is able to move all extremities he has no facial asymmetry.  Disoriented to situation but oriented to self.   ED Results / Procedures / Treatments   Labs (all labs ordered are listed, but only abnormal results are displayed) Labs Reviewed  COMPREHENSIVE METABOLIC PANEL - Abnormal; Notable for the following components:      Result Value   Glucose, Bld 122 (*)    Albumin 3.2 (*)    AST 53 (*)    Total Bilirubin 2.8 (*)    All other components within normal limits  LACTIC ACID, PLASMA - Abnormal; Notable for the following components:   Lactic Acid, Venous 2.1 (*)    All other components within normal limits  AMMONIA - Abnormal; Notable for the following components:   Ammonia 71 (*)    All other components within normal limits  MAGNESIUM - Abnormal; Notable for the following components:   Magnesium 1.6 (*)    All other components within normal limits  ETHANOL  CK  LACTIC ACID, PLASMA  CBC WITH DIFFERENTIAL/PLATELET  URINALYSIS, W/ REFLEX TO CULTURE (INFECTION SUSPECTED)  CBG MONITORING, ED  TROPONIN I (HIGH SENSITIVITY)     I ordered and reviewed the above labs they are notable for slightly elevated lactic at 2.1 and ammonia that is slightly elevated at 71  EKG  ED ECG REPORT I, Pilar Jarvis, the attending physician, personally viewed and interpreted this ECG.   Date: 04/05/2023  EKG Time: 1647  Rate: 85  Rhythm: nsr w pvcs  Axis: nl  Intervals:none  ST&T Change: No STEMI    RADIOLOGY I independently reviewed and interpreted CT scan of the head see no obvious bleeding or midline shift   PROCEDURES:  Critical Care performed: No  Procedures   MEDICATIONS ORDERED IN ED: Medications  sodium chloride 0.9 % bolus 1,000 mL (has no administration in time range)  lactulose (CHRONULAC) 10 GM/15ML solution 30 g (has no administration in time range)    External physician / consultants:  I  spoke with hospitalist for admission and regarding care plan for this patient.   IMPRESSION / MDM / ASSESSMENT AND PLAN / ED COURSE  I reviewed the triage vital signs and the nursing notes.                                Patient's presentation is most consistent with acute presentation with potential threat to life or bodily function.  Differential diagnosis includes, but is not limited to, hepatic encephalopathy, intracranial bleeding, metabolic encephalopathy, CVA, infection   The patient is on the cardiac monitor to evaluate for evidence of arrhythmia and/or significant heart rate changes.  MDM: Patient with liver cirrhosis due to NASH, altered mental status and asterixis with mild jaundice has not taken lactulose in the last 24 hours with mildly elevated ammonia most consistent with hepatic encephalopathy.  No reports of trauma got a CT of the head which is negative for bleeding, send off other infectious workup like chest x-ray and urinalysis though with no focal infectious symptoms to suggest these etiologies of pneumonia or urine infection.  I ordered lactulose.  I doubt CVA given altered mental status only no focal neurologic deficits, and he is outside of the thrombolytic window.  Admission given altered mental status and likely hepatic encephalopathy.         FINAL CLINICAL IMPRESSION(S) / ED DIAGNOSES   Final diagnoses:  Altered mental status, unspecified altered mental status type  Hepatic encephalopathy     Rx / DC Orders   ED Discharge Orders     None        Note:  This document was prepared using Dragon voice recognition software and may include unintentional dictation errors.    Pilar Jarvis, MD 04/05/23 [phone]

## 2023-04-05 NOTE — Assessment & Plan Note (Addendum)
Patient had elevated lactic acid of 2.1, leukopenia, fever, organ involvement is neurology with altered mentation, acute hepatic encephalopathy Etiology workup in progress Blood cultures x 2 ordered, check a UA Cefepime and vancomycin per pharmacy ordered to LR 1 L bolus CT chest abdomen pelvis with contrast ordered, MRI of the brain without contrast ordered Discussed with cross coverage provider Admit to PCU, inpatient

## 2023-04-05 NOTE — Hospital Course (Signed)
Joshua Berry is a 77 year old male with history of NASH liver cirrhosis, hypertension, iron deficiency anemia, chronic thrombocytopenia, who presents from [facility] assisted [facility] via EMS on 04/05/23 to emergency department for chief concerns of altered mental status. According to his wife he became progressively altered throughout the day and afternoon yesterday. No recent illnesses, trauma, other acute medical complaints. Last night given his altered mental status did not take his lactulose.  04/20: disoriented to situation but oriented to self. (+)fever, tachypnea, low WBC 3.8 = SIRS/sepsis. Ammonia elevated to 71. Lactic 2.1 --> 1.7. COVID/flu/RSV neg. UA neg for UTI. CXR non-acute. CT head non-acute, (+)chronic microvascular ischemic change and cerebral volume loss. CT C/A/P: liver disease, patent TIPS shunt, severe T12 compression question subacute, no noted infection. Admitted for concern for sepsis / hepatic encephalopathy  04/21: MRI brain non-acute. Ammonia down to 33. BCx (+)strep, change abx to ceftriaxone, Echo ordered, will repeat BCx and ask ID to see tomorrow. PT/OT ordered.  04/22: stable. Repeat BCx. MRI to eval for osteo Tspine given compression fx --> no osteo noted. TTE no concerns. ID saw patient - will need 4 weeks IV abx.  04/23: Cardiology consulted for TEE.     Consultants:  Infectious Disease Cardiology   Procedures: none      ASSESSMENT & PLAN:   Principal Problem:   Severe sepsis Active Problems:   Acute hepatic encephalopathy   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   Chronic diastolic CHF (congestive heart failure)   Hypothyroidism   Overweight (BMI 25.0-29.9)   Secondary esophageal varices without bleeding   HLD (hyperlipidemia)   HTN (hypertension)   Physical deconditioning   Hypomagnesemia   Hepatic encephalopathy    Sepsis w/ positive BCx x4 strep, source likely dental infection  Blood cultures (+)strep   Cefepime and vancomycin per pharmacy --> ceftriaxone yesterday, today is Day 4 total abx, will need 4 weeks following culture results repeat BCx pending  ID following  TEE pending   Compression fracture T-spine MRI showed no infectious process in spine Pain control  R shoulder pain MRI pending to evaluate septic arthritis   Altered mental status, improving  Likely hepatic encephalopathy + sepsis  MRI of the brain without contrast no concerns  Monitor     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 twice daily lactulose 30 g p.o. twice daily   Thrombocytopenia D/t liver disease Has been unable to get dental extractions d/t this per daughter  Monitor Plt  If needing surgical procedures may have to get plt transfusion   Hypothyroidism Levothyroxine 100 mcg daily    Hypomagnesemia Magnesium sulfate 1 g IV one-time dose ordered follow magnesium level    HTN (hypertension) Atorvastatin 20 mg nightly   IDDM Type 2 SSI ac Basal insulin Adjust as needed based on Glc      DVT prophylaxis: TED hose, no Rx ppx d/t chronic thrombocytopenia  Pertinent IV fluids/nutrition: no continuous IV fluids; regular diet  Central lines / invasive devices: none  Code Status: DNR - DNR, admitting hospitalist Dr Joshua Berry noted she confirmed this with daughter at bedside 04/20. Daughter states patient has a MOST form  ACP documentation reviewed: 04/06/23, 8:01 AM: none on file   Current Admission Status: inpatient  TOC needs / Dispo plan:  HH, anticipate d/c back to ALF Barriers to discharge / significant pending items: blood culture repeat, TEE

## 2023-04-05 NOTE — ED Notes (Signed)
Advised nurse that patient has ready bed 

## 2023-04-05 NOTE — H&P (Addendum)
History and Physical   JEFFRY VOGELSANG ZOX:[id number] DOB: 1946-06-23 DOA: 04/05/2023  PCP: [facility]  Patient coming from: Home via EMS  I have personally briefly reviewed patient's old medical records in [facility] EMR.  Chief Concern: Altered mental status confusion  HPI: [name] is a 77 year old male with history of [name], liver cirrhosis, hypertension, iron deficiency anemia, chronic thrombocytopenia, who presents to emergency department for chief concerns of altered mental status.  Vitals show temperature of 100, respiration rate of 20, heart rate 91, blood pressure 123/57, SpO2 of 98% on room air.  Serum sodium is 137, potassium 2.8, chloride 1007, bicarb 22, BUN of 14, serum creatinine 1.14, EGFR greater than 60, nonfasting blood glucose 122, WBC 3.8, hemoglobin 13.1, platelets of 37.  Ammonia level was elevated at 71, high sensitive troponin was 12, CK level was 114.  Per EDP, patient missed his lactulose dosing on 04/04/2023.  ED treatment: Lactulose 30 g p.o. one-time dose, sodium chloride 1 L bolus ---------------------------- At bedside, patient responded to his first and last name.  He has his eyes closed.  He appears lethargic.  He opens his eyes with verbal stimuli however does not appropriately answer questions.  He turns to his daughter and did not acknowledge her and or state her name.  He was not able to tell me his age, current calendar year, current location.  He is able to protect his airway.  He does not follow commands otherwise.  Daughter, [name] is at bedside.  She takes him to his medical appointments and helps him with his medication though legally, his spouse is the healthcare power of attorney.  [name]'s mother and patient's healthcare power of attorney is wheelchair-bound.  They both live at [facility] assisted.  Per daughter at bedside, she reports that her mother did not report patient having any vomiting or diarrhea or fever at home.  Daughter  reports that patient may have missed 1 day dose of lactulose on 4/19.  Social history: He lives at [facility] assisted living.  He does not smoke, drink alcohol, use recreational drugs.  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  ROS: Unable to complete as patient is altered, acute hepatic encephalopathy  ED Course: Discussed with emergency medicine provider, patient requiring hospitalization for chief concerns of altered mental status, possible hepatic encephalopathy.  Assessment/Plan  Principal Problem:   Severe sepsis Active Problems:   Acute hepatic encephalopathy   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   Chronic diastolic CHF (congestive heart failure)   Hypothyroidism   Overweight (BMI 25.0-29.9)   Secondary esophageal varices without bleeding   HLD (hyperlipidemia)   HTN (hypertension)   Physical deconditioning   Hypomagnesemia   Hepatic encephalopathy   Assessment and Plan:  * Severe sepsis Patient had elevated lactic acid of 2.1, leukopenia, fever, organ involvement is neurology with altered mentation, acute hepatic encephalopathy Etiology workup in progress Blood cultures x 2 ordered, check a UA Cefepime and vancomycin per pharmacy ordered to LR 1 L bolus CT chest abdomen pelvis with contrast ordered, MRI of the brain without contrast ordered Discussed with cross coverage provider Admit to PCU, inpatient  Acute hepatic encephalopathy Versus altered mental status Etiology workup in progress CT chest abdomen pelvis with contrast ordered MRI of the brain without contrast ordered Check procalcitonin, UA has been ordered pending collection at the time of this dictation  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 Resumed home  rifaximin 550 mg p.o. twice daily, lactulose 30 g p.o. twice daily  Hypothyroidism Levothyroxine 100 mcg daily before  breakfast resumed  Hypomagnesemia Magnesium sulfate 1 g IV one-time dose ordered Recheck magnesium level in a.m.  HTN (hypertension) Atorvastatin 20 mg nightly resumed  Chart reviewed.   DVT prophylaxis: TED hose, pharmacologic DVT prophylaxis not initiated as patient has chronic Code Status: DNR, confirmed with daughter at bedside.  Daughter states patient has a MOST form Diet: N.p.o. except for sips with meds Family Communication: Updated daughter at bedside  Disposition Plan: Pending clinical course Consults called: None at this time Admission status: Telemetry cardiac, inpatient  Past Medical History:  Diagnosis Date   Arthritis    Bleeding ulcer    Cancer    melanoma, carcinoma   Cirrhosis of liver    Diabetes mellitus without complication    Heart murmur    Hypertension    Iron deficiency anemia    NASH (nonalcoholic steatohepatitis)    Sleep apnea    Spleen enlarged    Thrombocytopenia    Past Surgical History:  Procedure Laterality Date   CHOLECYSTECTOMY     COLONOSCOPY N/A 07/23/2019   Procedure: COLONOSCOPY;  Surgeon: Toney Reil, MD;  Location: [facility] ENDOSCOPY;  Service: Gastroenterology;  Laterality: N/A;   ESOPHAGOGASTRODUODENOSCOPY N/A 07/20/2019   Procedure: ESOPHAGOGASTRODUODENOSCOPY (EGD);  Surgeon: Toney Reil, MD;  Location: [facility] ENDOSCOPY;  Service: Gastroenterology;  Laterality: N/A;   ESOPHAGOGASTRODUODENOSCOPY Left 08/20/2019   Procedure: ESOPHAGOGASTRODUODENOSCOPY (EGD);  Surgeon: Pasty Spillers, MD;  Location: [facility] ENDOSCOPY;  Service: Endoscopy;  Laterality: Left;   ESOPHAGOGASTRODUODENOSCOPY (EGD) WITH PROPOFOL N/A 04/25/2021   Procedure: ESOPHAGOGASTRODUODENOSCOPY (EGD) WITH PROPOFOL;  Surgeon: Toney Reil, MD;  Location: [facility] ENDOSCOPY;  Service: Gastroenterology;  Laterality: N/A;   SKIN BIOPSY     TEE WITHOUT CARDIOVERSION N/A 12/23/2018   Procedure: TRANSESOPHAGEAL ECHOCARDIOGRAM (TEE);  Surgeon: Dalia Heading, MD;   Location: [facility] ORS;  Service: Cardiovascular;  Laterality: N/A;   TEE WITHOUT CARDIOVERSION N/A 05/21/2022   Procedure: TRANSESOPHAGEAL ECHOCARDIOGRAM (TEE);  Surgeon: Antonieta Iba, MD;  Location: [facility] ORS;  Service: Cardiovascular;  Laterality: N/A;   Social History:  reports that he quit smoking about 54 years ago. His smoking use included cigarettes. He has a 2.50 pack-year smoking history. He has never used smokeless tobacco. He reports that he does not currently use alcohol. He reports that he does not currently use drugs.  Allergies  Allergen Reactions   Nsaids     Other Reaction(s): Ascites   Camphor Rash and Swelling   Nickel Rash   Sulfur Rash and Swelling   Sulfamethoxazole Swelling   Latex Rash   Lisinopril Hives    Other Reaction(s): Cough   Family History  Problem Relation Age of Onset   Heart attack Brother    Family history: Family history reviewed and not pertinent.  Prior to Admission medications   Medication Sig Start Date End Date Taking? Authorizing Provider  ASPERCREME LIDOCAINE 4 % LIQD Apply 1 Application topically daily as needed. 06/07/22   [provider]  aspirin EC 81 MG tablet Take 81 mg by mouth daily. Patient not taking: Reported on 08/04/2022    [provider]  atorvastatin (LIPITOR) 20 MG tablet Take 20 mg by mouth at bedtime.    [provider]  augmented betamethasone dipropionate (DIPROLENE-AF) 0.05 % cream Apply 1 application topically 2 (two) times daily.    [provider]  Cholecalciferol 50 MCG (2000 UT) TABS  Take 1 tablet by mouth daily.    [provider]  famotidine (PEPCID) 20 MG tablet Take 20 mg by mouth daily.    [provider]  folic acid (FOLVITE) 1 MG tablet Take 1 tablet by mouth daily. 12/03/19   [provider]  furosemide (LASIX) 20 MG tablet Take 1 tablet (20 mg total) by mouth daily. 04/07/22   Arnetha Courser, MD  insulin aspart (NOVOLOG) 100 UNIT/ML injection  Inject 10 Units into the skin 3 (three) times daily before meals. Patient taking differently: Inject 16-22 Units into the skin 3 (three) times daily before meals. (Based on blood glucose reading) 04/26/21   Rhetta Mura, MD  insulin glargine (LANTUS) 100 UNIT/ML injection Inject 0.14 mLs (14 Units total) into the skin daily. 08/05/22   Hollice Espy, MD  ketotifen (ZADITOR) 0.025 % ophthalmic solution Place 1 drop into both eyes 2 (two) times daily.    [provider]  Lactobacillus Rhamnosus, GG, (RA PROBIOTIC DIGESTIVE CARE) CAPS Take 1 capsule by mouth daily.    [provider]  lactulose (CHRONULAC) 10 GM/15ML solution Take 45 mLs (30 g total) by mouth 2 (two) times daily. 08/05/22   Hollice Espy, MD  levothyroxine (SYNTHROID) 100 MCG tablet Take 100 mcg by mouth daily before breakfast.    [provider]  lidocaine (LIDODERM) 5 % Place 2 patches onto the skin daily. Remove & Discard patch within 12 hours or as directed by MD 05/28/22   [name], MD  magnesium oxide (MAG-OX) 400 MG tablet Take 2 tablets (800 mg total) by mouth 2 (two) times daily. 05/28/22   [name], MD  Melatonin 10 MG TABS Take 10 mg by mouth at bedtime.    [provider]  Omega-3 Fatty Acids (FISH OIL) 1000 MG CAPS Take 1 capsule by mouth 2 (two) times daily.    [provider]  pantoprazole (PROTONIX) 40 MG tablet Take 1 tablet (40 mg total) by mouth 2 (two) times daily. 04/07/22 06/06/22  Arnetha Courser, MD  rifaximin (XIFAXAN) 550 MG TABS tablet Take 550 mg by mouth 2 (two) times daily. Takes with lactulose    [provider]  Semaglutide,0.25 or 0.5MG /DOS, 2 MG/1.5ML SOPN Inject 0.5 mg into the skin once a week.    [provider]  terazosin (HYTRIN) 5 MG capsule Take 5 mg by mouth at bedtime.    [provider]  trolamine salicylate (ASPERCREME) 10 % cream Apply topically as needed for muscle pain. 05/28/22   [name], MD  vitamin C  (ASCORBIC ACID) 500 MG tablet Take 1,000 mg by mouth daily. Patient not taking: Reported on 08/04/2022    [provider]   Physical Exam: Vitals:   04/05/23 1621 04/05/23 1625 04/05/23 1628 04/05/23 2017  BP:  (!) 123/57    Pulse: 84 91    Resp: 20     Temp: 100 F (37.8 C)   (!) 102.6 F (39.2 C)  TempSrc: Oral   Oral  SpO2: 98% 98%    Weight:   104.4 kg   Height:    (1.88 m)    Constitutional: appears confused, chronically ill, frail, generalized weak, altered Eyes: PERRL, lids and conjunctivae normal ENMT: Mucous membranes are moist. Posterior pharynx clear of any exudate or lesions. Age-appropriate dentition.  Unable to assess hearing Neck: normal, supple, no masses, no thyromegaly Respiratory: clear to auscultation bilaterally, no wheezing, no crackles. Normal respiratory effort. No accessory muscle use.  Cardiovascular:  Regular rate and rhythm, no murmurs / rubs / gallops. No extremity edema. 2+ pedal pulses. No carotid bruits.  Abdomen: Obese abdomen, no tenderness, no masses palpated, no hepatosplenomegaly. Bowel sounds positive.  Musculoskeletal: no clubbing / cyanosis. No joint deformity upper and lower extremities. Good ROM, no contractures, no atrophy. Normal muscle tone.  Skin: Bilateral lower extremity dry skin Neurologic: Sensation intact. Strength 5/5 in all 4.  Psychiatric: Currently lacks judgment and insight.  Sleepy, lethargic, responds to verbal stimuli.   EKG: independently reviewed, showing atrial fibrillation with rate of 85, QTc 478  Chest x-ray on Admission: I personally reviewed and I agree with radiologist reading as below.  CT Head Wo Contrast  Result Date: 04/05/2023 CLINICAL DATA:  Mental status change, unknown cause EXAM: CT HEAD WITHOUT CONTRAST TECHNIQUE: Contiguous axial images were obtained from the base of the skull through the vertex without intravenous contrast. RADIATION DOSE REDUCTION: This exam was performed according to the  departmental dose-optimization program which includes automated exposure control, adjustment of the mA and/or kV according to patient size and/or use of iterative reconstruction technique. COMPARISON:  08/02/2022 FINDINGS: Brain: No evidence of acute infarction, hemorrhage, hydrocephalus, extra-axial collection or mass lesion/mass effect. Patchy low-density changes within the periventricular and subcortical white matter most compatible with chronic microvascular ischemic change. Mild diffuse cerebral volume loss. Vascular: No hyperdense vessel or unexpected calcification. Skull: Normal. Negative for fracture or focal lesion. Sinuses/Orbits: No acute finding. Other: None. IMPRESSION: 1. No acute intracranial abnormality. 2. Chronic microvascular ischemic change and cerebral volume loss. Electronically Signed   By: Duanne Guess D.O.   On: 04/05/2023 17:13   DG Chest Port 1 View  Result Date: 04/05/2023 CLINICAL DATA:  Altered mental status EXAM: PORTABLE CHEST 1 VIEW COMPARISON:  CXR 07/31/22 FINDINGS: Status post TAVR. Possible trace left pleural effusion. No pneumothorax unchanged cardiac and mediastinal contours. No focal airspace opacity. There are prominent interstitial opacities, unchanged from prior exam. No radiographically apparent displaced rib fractures. Visualized upper abdomen is unremarkable. Surgical clips in the right upper quadrant. IMPRESSION: No focal airspace opacity. Electronically Signed   By: Lorenza Cambridge M.D.   On: 04/05/2023 16:52    Labs on Admission: I have personally reviewed following labs  CBC: Recent Labs  Lab 04/05/23 1642  WBC 3.8*  NEUTROABS 2.9  HGB 13.1  HCT 37.4*  MCV 93.0  PLT 37*   Basic Metabolic Panel: Recent Labs  Lab 04/05/23 1642  NA 137  K 3.8  CL 107  CO2 22  GLUCOSE 122*  BUN 14  CREATININE 1.14  CALCIUM 9.1  MG 1.6*   GFR: Estimated Creatinine Clearance: 68.8 mL/min (by C-G formula based on SCr of 1.14 mg/dL).  Liver Function  Tests: Recent Labs  Lab 04/05/23 1642  AST 53*  ALT 33  ALKPHOS 95  BILITOT 2.8*  PROT 6.6  ALBUMIN 3.2*   Recent Labs  Lab 04/05/23 1642  AMMONIA 71*   Cardiac Enzymes: Recent Labs  Lab 04/05/23 1642  CKTOTAL 114   Urine analysis:    Component Value Date/Time   COLORURINE YELLOW (A) 08/01/2022 0139   APPEARANCEUR CLEAR (A) 08/01/2022 0139   APPEARANCEUR Hazy 05/01/2014 1653   LABSPEC 1.014 08/01/2022 0139   LABSPEC 1.015 05/01/2014 1653   PHURINE 7.0 08/01/2022 0139   GLUCOSEU NEGATIVE 08/01/2022 0139   GLUCOSEU Negative 05/01/2014 1653   HGBUR NEGATIVE 08/01/2022 0139   BILIRUBINUR NEGATIVE 08/01/2022 0139   BILIRUBINUR Negative 05/01/2014 1653   KETONESUR NEGATIVE 08/01/2022  0139   PROTEINUR NEGATIVE 08/01/2022 0139   NITRITE NEGATIVE 08/01/2022 0139   LEUKOCYTESUR NEGATIVE 08/01/2022 0139   LEUKOCYTESUR Negative 05/01/2014 1653   CRITICAL CARE Performed by: Dr. Sedalia Muta  Total critical care time: 35 minutes  Critical care time was exclusive of separately billable procedures and treating other patients.  Critical care was necessary to treat or prevent imminent or life-threatening deterioration.  Critical care was time spent personally by me on the following activities: development of treatment plan with patient and/or surrogate as well as nursing, discussions with consultants, evaluation of patient's response to treatment, examination of patient, obtaining history from patient or surrogate, ordering and performing treatments and interventions, ordering and review of laboratory studies, ordering and review of radiographic studies, pulse oximetry and re-evaluation of patient's condition.  This document was prepared using Dragon Voice Recognition software and may include unintentional dictation errors.  Dr. Sedalia Muta [facility]  If 7PM-7AM, please contact overnight-coverage provider If 7AM-7PM, please contact day attending provider [email or web address]  04/05/2023,  8:30 PM

## 2023-04-05 NOTE — Assessment & Plan Note (Signed)
Magnesium sulfate 1 g IV one-time dose ordered Recheck magnesium level in a.m.

## 2023-04-05 NOTE — ED Triage Notes (Signed)
EMS called out to [facility] for AMS; wife reports he was "normal" this morning but unsure of time.

## 2023-04-05 NOTE — Assessment & Plan Note (Addendum)
Versus altered mental status Etiology workup in progress CT chest abdomen pelvis with contrast ordered MRI of the brain without contrast ordered Check procalcitonin, UA has been ordered pending collection at the time of this dictation

## 2023-04-05 NOTE — Consult Note (Signed)
Pharmacy Antibiotic Note  Joshua Berry is a 77 y.o. male admitted on 04/05/2023 with sepsis.  Pharmacy has been consulted for vancomycin and cefepime dosing.  Plan: Cefepime 2g IV every 8 hours Vancomycin  IV x1 followed by vancomycin  IV every 24 hours Goal AUC 400-550  Est AUC: 475.7 Est Cmax: 36.0 Est Cmin: 10.5 Calculated with SCr 1.14 mg/dL  Vd coefficient 1.61 L/kg   Height:  (188 cm) Weight: 104.4 kg (230 lb 3.2 oz) IBW/kg (Calculated) : 82.2  Temp (24hrs), Avg:101.3 F (38.5 C), Min:100 F (37.8 C), Max:102.6 F (39.2 C)  Recent Labs  Lab 04/05/23 1642 04/05/23 1938  WBC 3.8*  --   CREATININE 1.14  --   LATICACIDVEN 2.1* 1.7    Estimated Creatinine Clearance: 68.8 mL/min (by C-G formula based on SCr of 1.14 mg/dL).    Allergies  Allergen Reactions   Nsaids     Other Reaction(s): Ascites   Camphor Rash and Swelling   Nickel Rash   Sulfur Rash and Swelling   Sulfamethoxazole Swelling   Latex Rash   Lisinopril Hives    Other Reaction(s): Cough    Antimicrobials this admission: 4/20 vancomycin >>  4/20 cefepime >>   Microbiology results: 4/20 BCx: sent  Thank you for allowing pharmacy to be a part of this patient's care.  Selinda Eon 04/05/2023 8:51 PM

## 2023-04-05 NOTE — Assessment & Plan Note (Signed)
-   Atorvastatin 20 mg nightly resumed 

## 2023-04-06 ENCOUNTER — Inpatient Hospital Stay (HOSPITAL_COMMUNITY)
Admit: 2023-04-06 | Discharge: 2023-04-06 | Disposition: A | Discharge status: Home / Self Care | Payer: No Typology Code available for payment source | Attending: Osteopathic Medicine

## 2023-04-06 ENCOUNTER — Other Ambulatory Visit: Payer: Self-pay

## 2023-04-06 DIAGNOSIS — R4182 Altered mental status, unspecified: Secondary | ICD-10-CM | POA: Diagnosis present

## 2023-04-06 DIAGNOSIS — R7881 Bacteremia: Secondary | ICD-10-CM | POA: Diagnosis not present

## 2023-04-06 DIAGNOSIS — A419 Sepsis, unspecified organism: Secondary | ICD-10-CM | POA: Diagnosis not present

## 2023-04-06 DIAGNOSIS — R6520 Severe sepsis without septic shock: Secondary | ICD-10-CM | POA: Diagnosis not present

## 2023-04-06 LAB — BLOOD CULTURE ID PANEL (REFLEXED) - BCID2

## 2023-04-06 LAB — URINALYSIS, W/ REFLEX TO CULTURE (INFECTION SUSPECTED)
Bacteria, UA: NONE SEEN
Bilirubin Urine: NEGATIVE
Glucose, UA: NEGATIVE mg/dL
Ketones, ur: NEGATIVE mg/dL
Leukocytes,Ua: NEGATIVE
Nitrite: NEGATIVE
Protein, ur: 30 mg/dL — AB
Specific Gravity, Urine: >1.046 — ABNORMAL HIGH (ref 1.005–1.030)
Squamous Epithelial / HPF: NONE SEEN /HPF (ref 0–5)
WBC, UA: NONE SEEN WBC/hpf (ref 0–5)
pH: 5 (ref 5.0–8.0)

## 2023-04-06 LAB — ECHOCARDIOGRAM COMPLETE
AR max vel: 1.4 cm2
AV Area VTI: 1.36 cm2
AV Area mean vel: 1.4 cm2
AV Mean grad: 11.8 mmHg
AV Peak grad: 21.4 mmHg
Ao pk vel: 2.32 m/s
Area-P 1/2: 2.57 cm2
Height: 74 in
S' Lateral: 3.3 cm
Weight: 3683.2 oz

## 2023-04-06 LAB — CBC
HCT: 35.4 % — ABNORMAL LOW (ref 39.0–52.0)
Hemoglobin: 12.4 g/dL — ABNORMAL LOW (ref 13.0–17.0)
MCH: 32.6 pg (ref 26.0–34.0)
MCHC: 35 g/dL (ref 30.0–36.0)
MCV: 93.2 fL (ref 80.0–100.0)
Platelets: 31 10*3/uL — ABNORMAL LOW (ref 150–400)
RBC: 3.8 MIL/uL — ABNORMAL LOW (ref 4.22–5.81)
RDW: 15.9 % — ABNORMAL HIGH (ref 11.5–15.5)
WBC: 5.5 10*3/uL (ref 4.0–10.5)
nRBC: 0 % (ref 0.0–0.2)

## 2023-04-06 LAB — BASIC METABOLIC PANEL
Anion gap: 8 (ref 5–15)
BUN: 17 mg/dL (ref 8–23)
CO2: 20 mmol/L — ABNORMAL LOW (ref 22–32)
Calcium: 8.6 mg/dL — ABNORMAL LOW (ref 8.9–10.3)
Chloride: 106 mmol/L (ref 98–111)
Creatinine, Ser: 1.06 mg/dL (ref 0.61–1.24)
GFR, Estimated: >60 mL/min (ref >60)
Glucose, Bld: 145 mg/dL — ABNORMAL HIGH (ref 70–99)
Potassium: 3.8 mmol/L (ref 3.5–5.1)
Sodium: 134 mmol/L — ABNORMAL LOW (ref 135–145)

## 2023-04-06 LAB — GLUCOSE, CAPILLARY
Glucose-Capillary: 190 mg/dL — ABNORMAL HIGH (ref 70–99)
Glucose-Capillary: 128 mg/dL — ABNORMAL HIGH (ref 70–99)
Glucose-Capillary: 130 mg/dL — ABNORMAL HIGH (ref 70–99)
Glucose-Capillary: 141 mg/dL — ABNORMAL HIGH (ref 70–99)

## 2023-04-06 LAB — AMMONIA: Ammonia: 33 umol/L (ref 9–35)

## 2023-04-06 LAB — CBG MONITORING, ED
Glucose-Capillary: 123 mg/dL — ABNORMAL HIGH (ref 70–99)
Glucose-Capillary: 133 mg/dL — ABNORMAL HIGH (ref 70–99)

## 2023-04-06 LAB — MAGNESIUM: Magnesium: 1.8 mg/dL (ref 1.7–2.4)

## 2023-04-06 LAB — CULTURE, BLOOD (ROUTINE X 2)

## 2023-04-06 MED ORDER — INSULIN ASPART 100 UNIT/ML IJ SOLN
6.0000 [IU] | Freq: Three times a day (TID) | INTRAMUSCULAR | Status: DC
  Administered 2023-04-06 – 2023-04-10 (×9): 6 [IU] via SUBCUTANEOUS
  Filled 2023-04-06 (×7): qty 1

## 2023-04-06 MED ORDER — INSULIN ASPART 100 UNIT/ML IJ SOLN
0.0000 [IU] | Freq: Three times a day (TID) | INTRAMUSCULAR | Status: DC
  Administered 2023-04-06: 2 [IU] via SUBCUTANEOUS
  Administered 2023-04-06 – 2023-04-07 (×3): 1 [IU] via SUBCUTANEOUS
  Administered 2023-04-08: 2 [IU] via SUBCUTANEOUS
  Administered 2023-04-09 – 2023-04-10 (×3): 1 [IU] via SUBCUTANEOUS
  Administered 2023-04-11: 2 [IU] via SUBCUTANEOUS
  Administered 2023-04-11 – 2023-04-12 (×3): 3 [IU] via SUBCUTANEOUS
  Administered 2023-04-13: 1 [IU] via SUBCUTANEOUS
  Administered 2023-04-13: 2 [IU] via SUBCUTANEOUS
  Administered 2023-04-13 – 2023-04-14 (×2): 1 [IU] via SUBCUTANEOUS
  Administered 2023-04-14: 2 [IU] via SUBCUTANEOUS
  Administered 2023-04-15: 1 [IU] via SUBCUTANEOUS
  Administered 2023-04-15: 5 [IU] via SUBCUTANEOUS
  Administered 2023-04-16: 1 [IU] via SUBCUTANEOUS
  Administered 2023-04-16: 3 [IU] via SUBCUTANEOUS
  Filled 2023-04-06 (×21): qty 1

## 2023-04-06 MED ORDER — SODIUM CHLORIDE 0.9 % IV SOLN
2.0000 g | INTRAVENOUS | Status: DC
  Administered 2023-04-06 – 2023-04-10 (×5): 2 g via INTRAVENOUS
  Filled 2023-04-06: qty 2
  Filled 2023-04-06 (×3): qty 20
  Filled 2023-04-06: qty 2

## 2023-04-06 MED ORDER — INSULIN GLARGINE-YFGN 100 UNIT/ML ~~LOC~~ SOLN
10.0000 [IU] | Freq: Every day | SUBCUTANEOUS | Status: DC
  Administered 2023-04-06 – 2023-04-16 (×11): 10 [IU] via SUBCUTANEOUS
  Filled 2023-04-06 (×12): qty 0.1

## 2023-04-06 NOTE — ED Notes (Signed)
MD notified of potential need for PT/OT consult. Per daughter at bedside, pt is not quite at his baseline physically and seems weaker than normal.

## 2023-04-06 NOTE — Evaluation (Signed)
Physical Therapy Evaluation Patient Details Name: Joshua Berry MRN: [id number] DOB: [birth year] Today's Date: 04/06/2023  History of Present Illness  Patient is a 77 year old male with chief complaint of AMS, found to be septic with infection source unclear. History of [name], liver cirrhosis, hypertension, iron deficiency anemia, chronic thrombocytopenia  Clinical Impression  Patient is cooperative and agreeable to PT evaluation. Brother at the beside. The patient is from ALF where he uses a rollator for ambulation.  Today, the patient is able to follow single step commands with increased time. He required assistance for bed mobility. 2 standing bouts performed with lifting assistance and cues for technique. He was able to take several side steps along the side of bed using rolling walker with steadying assistance provided. Patient feels weaker than usual with limited standing tolerance. Recommend to continue PT to maximize independence and to decrease caregiver burden. Anticipate the need for continued PT and intermittent assistance at discharge.      Recommendations for follow up therapy are one component of a multi-disciplinary discharge planning process, led by the attending physician.  Recommendations may be updated based on patient status, additional functional criteria and insurance authorization.  Follow Up Recommendations       Assistance Recommended at Discharge Intermittent Supervision/Assistance  Patient can return home with the following  A little help with walking and/or transfers;A little help with bathing/dressing/bathroom;Assistance with cooking/housework;Help with stairs or ramp for entrance;Assist for transportation    Equipment Recommendations None recommended by PT  Recommendations for Other Services       Functional Status Assessment Patient has had a recent decline in their functional status and demonstrates the ability to make significant improvements in function in a  reasonable and predictable amount of time.     Precautions / Restrictions Precautions Precautions: Fall Restrictions Weight Bearing Restrictions: No      Mobility  Bed Mobility Overal bed mobility: Needs Assistance Bed Mobility: Supine to Sit, Sit to Supine     Supine to sit: HOB elevated, Max assist Sit to supine: Mod assist, HOB elevated   General bed mobility comments: trunk and BLE support provided. increased time and effort required    Transfers Overall transfer level: Needs assistance Equipment used: Rolling walker (2 wheels) Transfers: Sit to/from Stand Sit to Stand: Min assist, Mod assist, From elevated surface           General transfer comment: lifting assistance required for standing. verbal cues for hand placement for safety    Ambulation/Gait Ambulation/Gait assistance: Min assist Gait Distance (Feet): 2 Feet Assistive device: Rolling walker (2 wheels)         General Gait Details: patient able to take several side steps to the right with steadying assistance and cues for technique. patient feeling generally unsteady with standing with limited standing tolerance for progression of further ambulation at this time  Stairs            Wheelchair Mobility    Modified Rankin (Stroke Patients Only)       Balance Overall balance assessment: Needs assistance Sitting-balance support: Feet supported Sitting balance-Leahy Scale: Fair     Standing balance support: Single extremity supported, Reliant on assistive device for balance Standing balance-Leahy Scale: Fair Standing balance comment: using rolling walker for support in standing                             Pertinent Vitals/Pain Pain Assessment Pain Assessment: No/denies pain  Home Living Family/patient expects to be discharged to:: Assisted living                 Home Equipment: Rollator (4 wheels);Cane - single point;Shower Counsellor (2  wheels);Wheelchair - manual (lift chair) Additional Comments: lives at ALF with his spouse. meals provided    Prior Function Prior Level of Function : Needs assist             Mobility Comments: Mod I with rollator. limited ambulation (brother reports he will sometimes walk to the dining room to eat) ADLs Comments: geneally Mod I per patient report     Hand Dominance        Extremity/Trunk Assessment   Upper Extremity Assessment Upper Extremity Assessment: Generalized weakness (R shoulder joint crepitus noted with limited AROM at baseline)    Lower Extremity Assessment Lower Extremity Assessment: Generalized weakness       Communication   Communication: No difficulties  Cognition Arousal/Alertness: Awake/alert Behavior During Therapy: WFL for tasks assessed/performed Overall Cognitive Status: History of cognitive impairments - at baseline                                 General Comments: patient is able to follow single step commands with increased time. some confusion about recent events. brother reports intermittent baseline confusion        General Comments General comments (skin integrity, edema, etc.): brother at bedside during assessment    Exercises     Assessment/Plan    PT Assessment Patient needs continued PT services  PT Problem List Decreased strength;Decreased activity tolerance;Decreased balance;Decreased mobility;Decreased cognition;Decreased knowledge of precautions;Decreased safety awareness       PT Treatment Interventions DME instruction;Gait training;Functional mobility training;Therapeutic activities;Therapeutic exercise;Balance training;Neuromuscular re-education;Cognitive remediation;Patient/family education;Wheelchair mobility training    PT Goals (Current goals can be found in the Care Plan section)  Acute Rehab PT Goals Patient Stated Goal: to return home PT Goal Formulation: With patient Time For Goal Achievement:  04/20/23 Potential to Achieve Goals: Fair    Frequency Min 3X/week     Co-evaluation               AM-PAC PT "6 Clicks" Mobility  Outcome Measure Help needed turning from your back to your side while in a flat bed without using bedrails?: A Lot Help needed moving from lying on your back to sitting on the side of a flat bed without using bedrails?: A Lot Help needed moving to and from a bed to a chair (including a wheelchair)?: A Lot Help needed standing up from a chair using your arms (e.g., wheelchair or bedside chair)?: A Lot Help needed to walk in hospital room?: A Lot Help needed climbing 3-5 steps with a railing? : A Lot 6 Click Score: 12    End of Session   Activity Tolerance: Patient tolerated treatment well;Patient limited by fatigue Patient left: in bed;with call bell/phone within reach;with bed alarm set Nurse Communication: Mobility status PT Visit Diagnosis: Unsteadiness on feet (R26.81);Muscle weakness (generalized) (M62.81)    Time: 1610-9604 PT Time Calculation (min) (ACUTE ONLY): 18 min   Charges:   PT Evaluation $PT Eval Low Complexity: 1 Low PT Treatments $Therapeutic Activity: 8-22 mins        Donna Bernard, PT, MPT   [name] 04/06/2023, 2:42 PM

## 2023-04-06 NOTE — Progress Notes (Addendum)
PROGRESS NOTE    Joshua Berry   ZOX:[id number] DOB: 11-04-1946  DOA: 04/05/2023 Date of Service: 04/06/23 PCP: [facility]     Brief Narrative / Hospital Course:  Joshua Berry is a 77 year old male with history of NASH liver cirrhosis, hypertension, iron deficiency anemia, chronic thrombocytopenia, who presents from [facility] assisted [facility] via EMS on 04/05/23 to emergency department for chief concerns of altered mental status. According to his wife he became progressively altered throughout the day and afternoon yesterday. No recent illnesses, trauma, other acute medical complaints. Last night given his altered mental status did not take his lactulose.  04/20: disoriented to situation but oriented to self. (+)fever, tachypnea, low WBC 3.8 = SIRS/sepsis. Ammonia elevated to 71. Lactic 2.1 --> 1.7. COVID/flu/RSV neg. UA neg for UTI. CXR non-acute. CT head non-acute, (+)chronic microvascular ischemic change and cerebral volume loss. CT C/A/P: liver disease, patent TIPS shunt, severe T12 compression question subacute, no noted infection. Admitted for concern for sepsis / hepatic encephalopathy  04/21: MRI brain non-acute. Ammonia down to 33. BCx (+)strep, change abx to ceftriaxone, Echo ordered, will repeat BCx and ask ID to see tomorrow. PT/OT ordered.     Consultants:  none  Procedures: none      ASSESSMENT & PLAN:   Principal Problem:   Severe sepsis Active Problems:   Acute hepatic encephalopathy   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   Chronic diastolic CHF (congestive heart failure)   Hypothyroidism   Overweight (BMI 25.0-29.9)   Secondary esophageal varices without bleeding   HLD (hyperlipidemia)   HTN (hypertension)   Physical deconditioning   Hypomagnesemia   Hepatic encephalopathy    SIRS, (+) sepsis w/ positive BCx x4 strep, no obvious infection source though  Patient had elevated lactic acid of 2.1, leukopenia,  fever, organ involvement is neurology with altered mentation, acute hepatic encephalopathy Etiology workup in progress Blood cultures (+)strep  Cefepime and vancomycin per pharmacy --> ceftriaxone today following culture results Will repeat BCx in 48 hrs on abx and ask ID to evaluate tomorrow  Echo ordered   Altered mental status, improving  Likely hepatic encephalopathy MRI of the brain without contrast ordered Follow ammonia     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 twice daily lactulose 30 g p.o. twice daily   Hypothyroidism Levothyroxine 100 mcg daily    Hypomagnesemia Magnesium sulfate 1 g IV one-time dose ordered follow magnesium level    HTN (hypertension) Atorvastatin 20 mg nightly   IDDM Type 2 SSI ac Basal insulin Adjust as needed based on Glc      DVT prophylaxis: TED hose, no Rx ppx d/t chronic thrombocytopenia  Pertinent IV fluids/nutrition: no continuous IV fluids, pt alert resumed regular diet  Central lines / invasive devices: none  Code Status: DNR - DNR, admitting hospitalist Dr Sedalia Muta noted she confirmed this with daughter at bedside 04/20. Daughter states patient has a MOST form  ACP documentation reviewed: 04/06/23, 8:01 AM: none on file   Current Admission Status: inpatient  TOC needs / Dispo plan: pend PT/OT eval, may need HH, anticipate d/c back to ALF Barriers to discharge / significant pending items: blood cultures, clinical improvement, if BCx remain negative may be able to d/c tomorrow              Subjective / Brief ROS:  Patient reports no concerns He is alert and pleasant but not answering questions in great detail, not apparently oriented to situation  Denies CP/SOB, pain Hasn't had breakfast yet but it is at bedside    Family Communication: daughter is at bedside on rounds     Objective Findings:  Vitals:   04/06/23 0800 04/06/23 0900 04/06/23 0946 04/06/23 1144   BP: 99/80 125/71 130/65 (!) 119/54  Pulse: 78 78 72 74  Resp: 17 20 16 16   Temp: 98.4 F (36.9 C) 98.2 F (36.8 C) 98.7 F (37.1 C) 98.8 F (37.1 C)  TempSrc: Oral Oral    SpO2: 96% 96% 97% 98%  Weight:      Height:        Intake/Output Summary (Last 24 hours) at 04/06/2023 1225 Last data filed at 04/05/2023 2300 Gross per 24 hour  Intake 50 ml  Output --  Net 50 ml   Filed Weights   04/05/23 1628  Weight: 104.4 kg    Examination:  Physical Exam Constitutional:      General: He is not in acute distress. Cardiovascular:     Rate and Rhythm: Normal rate and regular rhythm.  Pulmonary:     Effort: Pulmonary effort is normal.     Breath sounds: Normal breath sounds.  Abdominal:     General: Abdomen is flat.     Palpations: Abdomen is soft.  Musculoskeletal:     Right lower leg: No edema.     Left lower leg: No edema.  Skin:    General: Skin is warm.  Neurological:     General: No focal deficit present.     Mental Status: He is alert. He is disoriented.  Psychiatric:        Mood and Affect: Mood normal.        Behavior: Behavior normal.          Scheduled Medications:   acidophilus  1 capsule Oral Daily   ascorbic acid  1,000 mg Oral Daily   atorvastatin  20 mg Oral QHS   famotidine  20 mg Oral Daily   folic acid  1 mg Oral Daily   insulin aspart  0-9 Units Subcutaneous TID WC   insulin aspart  6 Units Subcutaneous TID WC   insulin glargine-yfgn  10 Units Subcutaneous QHS   lactulose  30 g Oral Once   lactulose  30 g Oral BID   levothyroxine  100 mcg Oral Q0600   magnesium oxide  800 mg Oral BID   melatonin  10 mg Oral QHS   mineral oil-hydrophilic petrolatum   Topical BID   pantoprazole  40 mg Oral BID   rifaximin  550 mg Oral BID    Continuous Infusions:  acetaminophen Stopped (04/05/23 2222)   cefTRIAXone (ROCEPHIN)  IV      PRN Medications:  acetaminophen, acetaminophen **OR** acetaminophen, ondansetron **OR** ondansetron (ZOFRAN) IV,  senna-docusate, trolamine salicylate  Antimicrobials from admission:  Anti-infectives (From admission, onward)    Start     Dose/Rate Route Frequency Ordered Stop   04/06/23 2200  vancomycin (VANCOREADY) IVPB 2000 mg/400 mL  Status:  Discontinued        2,000 mg 200 mL/hr over 120 Minutes Intravenous Every 24 hours 04/05/23 2054 04/06/23 1224   04/06/23 1400  cefTRIAXone (ROCEPHIN) 2 g in sodium chloride 0.9 % 100 mL IVPB        2 g 200 mL/hr over 30 Minutes Intravenous Every 24 hours 04/06/23 1224     04/05/23 2200  rifaximin (XIFAXAN) tablet 550 mg       Note to Pharmacy: Takes with lactulose  550 mg Oral 2 times daily 04/05/23 1800     04/05/23 2200  vancomycin (VANCOCIN) IVPB 1000 mg/200 mL premix       See Hyperspace for full Linked Orders Report.   1,000 mg 200 mL/hr over 60 Minutes Intravenous  Once 04/05/23 2054 04/06/23 0104   04/05/23 2100  vancomycin (VANCOREADY) IVPB 1500 mg/300 mL       See Hyperspace for full Linked Orders Report.   1,500 mg 150 mL/hr over 120 Minutes Intravenous  Once 04/05/23 2054 04/06/23 0456   04/05/23 2100  ceFEPIme (MAXIPIME) 2 g in sodium chloride 0.9 % 100 mL IVPB  Status:  Discontinued        2 g 200 mL/hr over 30 Minutes Intravenous Every 8 hours 04/05/23 2054 04/06/23 1224           Data Reviewed:  I have personally reviewed the following...  CBC: Recent Labs  Lab 04/05/23 1642 04/06/23 0518  WBC 3.8* 5.5  NEUTROABS 2.9  --   HGB 13.1 12.4*  HCT 37.4* 35.4*  MCV 93.0 93.2  PLT 37* 31*   Basic Metabolic Panel: Recent Labs  Lab 04/05/23 1642 04/06/23 0518  NA 137 134*  K 3.8 3.8  CL 107 106  CO2 22 20*  GLUCOSE 122* 145*  BUN 14 17  CREATININE 1.14 1.06  CALCIUM 9.1 8.6*  MG 1.6* 1.8   GFR: Estimated Creatinine Clearance: 74 mL/min (by C-G formula based on SCr of 1.06 mg/dL). Liver Function Tests: Recent Labs  Lab 04/05/23 1642  AST 53*  ALT 33  ALKPHOS 95  BILITOT 2.8*  PROT 6.6  ALBUMIN 3.2*    No results for input(s): "LIPASE", "AMYLASE" in the last 168 hours. Recent Labs  Lab 04/05/23 1642 04/06/23 0947  AMMONIA 71* 33   Coagulation Profile: No results for input(s): "INR", "PROTIME" in the last 168 hours. Cardiac Enzymes: Recent Labs  Lab 04/05/23 1642  CKTOTAL 114   BNP (last 3 results) No results for input(s): "PROBNP" in the last 8760 hours. HbA1C: No results for input(s): "HGBA1C" in the last 72 hours. CBG: Recent Labs  Lab 04/05/23 1937 04/06/23 0512 04/06/23 0819 04/06/23 1138  GLUCAP 119* 123* 133* 190*   Lipid Profile: No results for input(s): "CHOL", "HDL", "LDLCALC", "TRIG", "CHOLHDL", "LDLDIRECT" in the last 72 hours. Thyroid Function Tests: No results for input(s): "TSH", "T4TOTAL", "FREET4", "T3FREE", "THYROIDAB" in the last 72 hours. Anemia Panel: No results for input(s): "VITAMINB12", "FOLATE", "FERRITIN", "TIBC", "IRON", "RETICCTPCT" in the last 72 hours. Most Recent Urinalysis On File:     Component Value Date/Time   COLORURINE YELLOW (A) 04/06/2023 0217   APPEARANCEUR CLEAR (A) 04/06/2023 0217   APPEARANCEUR Hazy 05/01/2014 1653   LABSPEC >1.046 (H) 04/06/2023 0217   LABSPEC 1.015 05/01/2014 1653   PHURINE 5.0 04/06/2023 0217   GLUCOSEU NEGATIVE 04/06/2023 0217   GLUCOSEU Negative 05/01/2014 1653   HGBUR SMALL (A) 04/06/2023 0217   BILIRUBINUR NEGATIVE 04/06/2023 0217   BILIRUBINUR Negative 05/01/2014 1653   KETONESUR NEGATIVE 04/06/2023 0217   PROTEINUR 30 (A) 04/06/2023 0217   NITRITE NEGATIVE 04/06/2023 0217   LEUKOCYTESUR NEGATIVE 04/06/2023 0217   LEUKOCYTESUR Negative 05/01/2014 1653   Sepsis Labs: @LABRCNTIP (procalcitonin:4,lacticidven:4) Microbiology: Recent Results (from the past 240 hour(s))  Resp panel by RT-PCR (RSV, Flu A&B, Covid)     Status: None   Collection Time: 04/05/23  9:24 PM   Specimen: Nasal Swab  Result Value Ref Range Status   SARS Coronavirus 2 by RT  PCR NEGATIVE NEGATIVE Final    Comment:  (NOTE) SARS-CoV-2 target nucleic acids are NOT DETECTED.  The SARS-CoV-2 RNA is generally detectable in upper respiratory specimens during the acute phase of infection. The lowest concentration of SARS-CoV-2 viral copies this assay can detect is 138 copies/mL. A negative result does not preclude SARS-Cov-2 infection and should not be used as the sole basis for treatment or other patient management decisions. A negative result may occur with  improper specimen collection/handling, submission of specimen other than nasopharyngeal swab, presence of viral mutation(s) within the areas targeted by this assay, and inadequate number of viral copies(<138 copies/mL). A negative result must be combined with clinical observations, patient history, and epidemiological information. The expected result is Negative.  Fact Sheet for Patients:  BloggerCourse.com  Fact Sheet for Healthcare Providers:  SeriousBroker.it  This test is no t yet approved or cleared by the [place] FDA and  has been authorized for detection and/or diagnosis of SARS-CoV-2 by FDA under an Emergency Use Authorization (EUA). This EUA will remain  in effect (meaning this test can be used) for the duration of the COVID-19 declaration under Section 564(b)(1) of the Act, 21 U.S.C.section 360bbb-3(b)(1), unless the authorization is terminated  or revoked sooner.       Influenza A by PCR NEGATIVE NEGATIVE Final   Influenza B by PCR NEGATIVE NEGATIVE Final    Comment: (NOTE) The Xpert Xpress SARS-CoV-2/FLU/RSV plus assay is intended as an aid in the diagnosis of influenza from Nasopharyngeal swab specimens and should not be used as a sole basis for treatment. Nasal washings and aspirates are unacceptable for Xpert Xpress SARS-CoV-2/FLU/RSV testing.  Fact Sheet for Patients: BloggerCourse.com  Fact Sheet for Healthcare  Providers: SeriousBroker.it  This test is not yet approved or cleared by the [place] FDA and has been authorized for detection and/or diagnosis of SARS-CoV-2 by FDA under an Emergency Use Authorization (EUA). This EUA will remain in effect (meaning this test can be used) for the duration of the COVID-19 declaration under Section 564(b)(1) of the Act, 21 U.S.C. section 360bbb-3(b)(1), unless the authorization is terminated or revoked.     Resp Syncytial Virus by PCR NEGATIVE NEGATIVE Final    Comment: (NOTE) Fact Sheet for Patients: BloggerCourse.com  Fact Sheet for Healthcare Providers: SeriousBroker.it  This test is not yet approved or cleared by the [place] FDA and has been authorized for detection and/or diagnosis of SARS-CoV-2 by FDA under an Emergency Use Authorization (EUA). This EUA will remain in effect (meaning this test can be used) for the duration of the COVID-19 declaration under Section 564(b)(1) of the Act, 21 U.S.C. section 360bbb-3(b)(1), unless the authorization is terminated or revoked.  Performed at [facility], [address] [place], [place] [address]   Culture, blood (Routine X 2) w Reflex to ID Panel     Status: None (Preliminary result)   Collection Time: 04/05/23  9:24 PM   Specimen: BLOOD  Result Value Ref Range Status   Specimen Description BLOOD BLOOD RIGHT ARM  Final   Special Requests   Final    BOTTLES DRAWN AEROBIC AND ANAEROBIC Blood Culture adequate volume   Culture  Setup Time   Final    GRAM POSITIVE COCCI IN BOTH AEROBIC AND ANAEROBIC BOTTLES CRITICAL RESULT CALLED TO, READ BACK BY AND VERIFIED WITH: CAROLYN COULTER AT 1211 04/06/23.PMF Performed at [facility], [address] [place], [place] [address]    Culture [facility] POSITIVE COCCI  Final  Report Status PENDING  Incomplete  Culture, blood (Routine X 2) w Reflex to ID Panel      Status: None (Preliminary result)   Collection Time: 04/05/23  9:24 PM   Specimen: BLOOD  Result Value Ref Range Status   Specimen Description BLOOD BLOOD LEFT ARM  Final   Special Requests   Final    BOTTLES DRAWN AEROBIC AND ANAEROBIC Blood Culture results may not be optimal due to an inadequate volume of blood received in culture bottles   Culture  Setup Time   Final    Organism ID to follow GRAM POSITIVE COCCI ANAEROBIC BOTTLE ONLY CRITICAL RESULT CALLED TO, READ BACK BY AND VERIFIED WITH: CAROLYN COULTER AT 1211 04/06/23.PMF Performed at [facility], [address] [place], [place] [address]    Culture [facility] POSITIVE COCCI  Final   Report Status PENDING  Incomplete  Blood Culture ID Panel (Reflexed)     Status: Abnormal   Collection Time: 04/05/23  9:24 PM  Result Value Ref Range Status   Enterococcus faecalis NOT DETECTED NOT DETECTED Final   Enterococcus Faecium NOT DETECTED NOT DETECTED Final   Listeria monocytogenes NOT DETECTED NOT DETECTED Final   Staphylococcus species NOT DETECTED NOT DETECTED Final   Staphylococcus aureus (BCID) NOT DETECTED NOT DETECTED Final   Staphylococcus epidermidis NOT DETECTED NOT DETECTED Final   Staphylococcus lugdunensis NOT DETECTED NOT DETECTED Final   Streptococcus species DETECTED (A) NOT DETECTED Final    Comment: Not Enterococcus species, Streptococcus agalactiae, Streptococcus pyogenes, or Streptococcus pneumoniae. CRITICAL RESULT CALLED TO, READ BACK BY AND VERIFIED WITH: CAROLYN COULTER AT 1211 04/06/23.PMF    Streptococcus agalactiae NOT DETECTED NOT DETECTED Final   Streptococcus pneumoniae NOT DETECTED NOT DETECTED Final   Streptococcus pyogenes NOT DETECTED NOT DETECTED Final   A.calcoaceticus-baumannii NOT DETECTED NOT DETECTED Final   Bacteroides fragilis NOT DETECTED NOT DETECTED Final   Enterobacterales NOT DETECTED NOT DETECTED Final   Enterobacter cloacae complex NOT DETECTED NOT DETECTED Final    Escherichia coli NOT DETECTED NOT DETECTED Final   Klebsiella aerogenes NOT DETECTED NOT DETECTED Final   Klebsiella oxytoca NOT DETECTED NOT DETECTED Final   Klebsiella pneumoniae NOT DETECTED NOT DETECTED Final   Proteus species NOT DETECTED NOT DETECTED Final   Salmonella species NOT DETECTED NOT DETECTED Final   Serratia marcescens NOT DETECTED NOT DETECTED Final   Haemophilus influenzae NOT DETECTED NOT DETECTED Final   Neisseria meningitidis NOT DETECTED NOT DETECTED Final   Pseudomonas aeruginosa NOT DETECTED NOT DETECTED Final   Stenotrophomonas maltophilia NOT DETECTED NOT DETECTED Final   Candida albicans NOT DETECTED NOT DETECTED Final   Candida auris NOT DETECTED NOT DETECTED Final   Candida glabrata NOT DETECTED NOT DETECTED Final   Candida krusei NOT DETECTED NOT DETECTED Final   Candida parapsilosis NOT DETECTED NOT DETECTED Final   Candida tropicalis NOT DETECTED NOT DETECTED Final   Cryptococcus neoformans/gattii NOT DETECTED NOT DETECTED Final    Comment: Performed at [facility], [address] [place], [place] [address]      Radiology Studies last 3 days: MR BRAIN WO CONTRAST  Result Date: 04/05/2023 CLINICAL DATA:  Altered mental status EXAM: MRI HEAD WITHOUT CONTRAST TECHNIQUE: Multiplanar, multiecho pulse sequences of the brain and surrounding structures were obtained without intravenous contrast. COMPARISON:  None Available. FINDINGS: Brain: No acute infarct, mass effect or extra-axial collection. No acute or chronic hemorrhage. There is multifocal hyperintense T2-weighted signal within the white matter. Generalized volume loss. The midline  structures are normal. Vascular: Major flow voids are preserved. Skull and upper cervical spine: Normal calvarium and skull base. Visualized upper cervical spine and soft tissues are normal. Sinuses/Orbits:No paranasal sinus fluid levels or advanced mucosal thickening. No mastoid or middle ear effusion. Normal orbits.  IMPRESSION: 1. No acute intracranial abnormality. 2. 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 Electronically Signed   By: Deatra Robinson M.D.   On: 04/05/2023 21:28   CT CHEST ABDOMEN PELVIS W CONTRAST  Result Date: 04/05/2023 CLINICAL DATA:  Sepsis EXAM: CT CHEST, ABDOMEN, AND PELVIS WITH CONTRAST TECHNIQUE: Multidetector CT imaging of the chest, abdomen and pelvis was performed following the standard protocol during bolus administration of intravenous contrast. RADIATION DOSE REDUCTION: This exam was performed according to the departmental dose-optimization program which includes automated exposure control, adjustment of the mA and/or kV according to patient size and/or use of iterative reconstruction technique. CONTRAST:  OMNIPAQUE IOHEXOL 300 MG/ML  SOLN COMPARISON:  Chest x-ray earlier 04/05/2023. CT angiogram chest 03/14/2022. Abdomen pelvis CT 07/20/2019. Multiple other comparison exams FINDINGS: CT CHEST FINDINGS Cardiovascular: Status post TAVR. Heart is nonenlarged. No pericardial effusion. The thoracic aorta overall has a normal course and caliber. Coronary artery calcifications are seen. Mediastinum/Nodes: Mildly patulous esophagus. No specific abnormal lymph node enlargement identified in the axillary region, hilum or mediastinum. Lungs/Pleura: No consolidation, pneumothorax or effusion. Breathing motion seen throughout the examination. There is some dependent atelectasis seen with some also areas of scarring and fibrotic change. Musculoskeletal: Scattered degenerative changes along the spine. There are multiple posterior endplate osteophytes identified with areas of stenosis in the thoracic spine region. There is severe compression of the T12 vertebral level. This was not seen on the prior CT scan. There is some lucencies along the inferior endplate with some air. This could be a benign compression but could be more acute to subacute. Please correlate with clinical history.  Streak artifact related to the patient's left shoulder arthroplasty. Gynecomastia. CT ABDOMEN PELVIS FINDINGS Hepatobiliary: Nodular liver consistent with chronic liver disease. 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 Main portal vein is patent with diameter at the liver hilum approaching 18 mm. Pancreas: Unremarkable. No pancreatic ductal dilatation or surrounding inflammatory changes. Spleen: Small splenule at the hilum. Spleen has a cephalocaudal length approaching 20 cm. Preserved enhancement. Adrenals/Urinary Tract: Adrenal glands are preserved. Stable lower pole left-sided renal cyst with a parapelvic component measuring 4 cm. Bosniak 1 lesion. There are some small exophytic similar cysts on the right side. No specific follow-up. Preserved contours of the urinary bladder. Mild bladder wall thickening. Stomach/Bowel: Scattered colonic stool. There is scattered diffuse colonic diverticula. Bowel is nondilated. Stomach is underdistended. Small bowel is nondilated. Vascular/Lymphatic: Normal caliber aorta and IVC with scattered mild vascular calcifications. No specific abnormal lymph node enlargement seen in the abdomen and pelvis. Reproductive: Enlarged prostate with mass effect along the base of the bladder. Other: Trace ascites in the pelvis. There is motion throughout the examination limiting evaluation particularly of the bowel and mesentery. Musculoskeletal: Advanced degenerative changes along the spine with multilevel stenosis. Again compression of T12. streak artifact as the patient's arms were scanned at the patient's side across the abdomen and pelvis. IMPRESSION: Evidence of chronic liver disease with a nodular heterogeneous liver, splenomegaly. Trace ascites. Patent tips shunt. Colonic diverticulosis. No bowel obstruction or free air. No rim enhancing fluid collections. No consolidation, pneumothorax or effusion. Severe compression of the T12 level which was not seen  previously.  Based on appearance this could be subacute. Please correlate with specific clinical history and symptomatology. Electronically Signed   By: Karen Kays M.D.   On: 04/05/2023 20:36   CT Head Wo Contrast  Result Date: 04/05/2023 CLINICAL DATA:  Mental status change, unknown cause EXAM: CT HEAD WITHOUT CONTRAST TECHNIQUE: Contiguous axial images were obtained from the base of the skull through the vertex without intravenous contrast. RADIATION DOSE REDUCTION: This exam was performed according to the departmental dose-optimization program which includes automated exposure control, adjustment of the mA and/or kV according to patient size and/or use of iterative reconstruction technique. COMPARISON:  08/02/2022 FINDINGS: Brain: No evidence of acute infarction, hemorrhage, hydrocephalus, extra-axial collection or mass lesion/mass effect. Patchy low-density changes within the periventricular and subcortical white matter most compatible with chronic microvascular ischemic change. Mild diffuse cerebral volume loss. Vascular: No hyperdense vessel or unexpected calcification. Skull: Normal. Negative for fracture or focal lesion. Sinuses/Orbits: No acute finding. Other: None. IMPRESSION: 1. No acute intracranial abnormality. 2. Chronic microvascular ischemic change and cerebral volume loss. Electronically Signed   By: Duanne Guess D.O.   On: 04/05/2023 17:13   DG Chest Port 1 View  Result Date: 04/05/2023 CLINICAL DATA:  Altered mental status EXAM: PORTABLE CHEST 1 VIEW COMPARISON:  CXR 07/31/22 FINDINGS: Status post TAVR. Possible trace left pleural effusion. No pneumothorax unchanged cardiac and mediastinal contours. No focal airspace opacity. There are prominent interstitial opacities, unchanged from prior exam. No radiographically apparent displaced rib fractures. Visualized upper abdomen is unremarkable. Surgical clips in the right upper quadrant. IMPRESSION: No focal airspace opacity. Electronically  Signed   By: Lorenza Cambridge M.D.   On: 04/05/2023 16:52             LOS: 1 day       Sunnie Nielsen, DO [facility] 04/06/2023, 12:25 PM    Dictation software may have been used to generate the above note. Typos may occur and escape review in typed/dictated notes. Please contact Dr Lyn Hollingshead directly for clarity if needed.  Staff may message me via secure chat in Epic  but this may not receive an immediate response,  please page me for urgent matters!  If 7PM-7AM, please contact night coverage [email or web address]

## 2023-04-06 NOTE — Progress Notes (Signed)
  Echocardiogram 2D Echocardiogram has been performed.  Lenor Coffin 04/06/2023, 2:29 PM

## 2023-04-06 NOTE — Consult Note (Signed)
PHARMACY - PHYSICIAN COMMUNICATION CRITICAL VALUE ALERT - BLOOD CULTURE IDENTIFICATION (BCID)  Joshua Berry is an 77 y.o. male who presented to [facility] on 04/05/2023 with a chief complaint of AMS. He was found to be septic in the ED. Source of infection is unclear. Workup ongoing  Assessment: 4 out of 4 bottles growing GPC. BCID detects Streptococcus spp. Resistance detection unavailable.  Name of physician (or Provider) Contacted: Sunnie Nielsen, DO  Current antibiotics: vancomycin, cefepime, rifaximin  Changes to prescribed antibiotics recommended: Switch from vancomycin and cefepime to ceftriaxone. Recommendations accepted by provider  Results for orders placed or performed during the hospital encounter of 04/05/23  Blood Culture ID Panel (Reflexed) (Collected: 04/05/2023  9:24 PM)  Result Value Ref Range   Enterococcus faecalis NOT DETECTED NOT DETECTED   Enterococcus Faecium NOT DETECTED NOT DETECTED   Listeria monocytogenes NOT DETECTED NOT DETECTED   Staphylococcus species NOT DETECTED NOT DETECTED   Staphylococcus aureus (BCID) NOT DETECTED NOT DETECTED   Staphylococcus epidermidis NOT DETECTED NOT DETECTED   Staphylococcus lugdunensis NOT DETECTED NOT DETECTED   Streptococcus species DETECTED (A) NOT DETECTED   Streptococcus agalactiae NOT DETECTED NOT DETECTED   Streptococcus pneumoniae NOT DETECTED NOT DETECTED   Streptococcus pyogenes NOT DETECTED NOT DETECTED   A.calcoaceticus-baumannii NOT DETECTED NOT DETECTED   Bacteroides fragilis NOT DETECTED NOT DETECTED   Enterobacterales NOT DETECTED NOT DETECTED   Enterobacter cloacae complex NOT DETECTED NOT DETECTED   Escherichia coli NOT DETECTED NOT DETECTED   Klebsiella aerogenes NOT DETECTED NOT DETECTED   Klebsiella oxytoca NOT DETECTED NOT DETECTED   Klebsiella pneumoniae NOT DETECTED NOT DETECTED   Proteus species NOT DETECTED NOT DETECTED   Salmonella species NOT DETECTED NOT DETECTED   Serratia marcescens  NOT DETECTED NOT DETECTED   Haemophilus influenzae NOT DETECTED NOT DETECTED   Neisseria meningitidis NOT DETECTED NOT DETECTED   Pseudomonas aeruginosa NOT DETECTED NOT DETECTED   Stenotrophomonas maltophilia NOT DETECTED NOT DETECTED   Candida albicans NOT DETECTED NOT DETECTED   Candida auris NOT DETECTED NOT DETECTED   Candida glabrata NOT DETECTED NOT DETECTED   Candida krusei NOT DETECTED NOT DETECTED   Candida parapsilosis NOT DETECTED NOT DETECTED   Candida tropicalis NOT DETECTED NOT DETECTED   Cryptococcus neoformans/gattii NOT DETECTED NOT DETECTED    Celene Squibb, PharmD PGY1 Pharmacy Resident 04/06/2023 12:21 PM

## 2023-04-06 NOTE — ED Notes (Signed)
Called 1C to check on bed status. Bed ready for 24 minutes. Per nursing secretary, nurse will complete handoff "in a little bit."

## 2023-04-07 ENCOUNTER — Inpatient Hospital Stay: Payer: No Typology Code available for payment source

## 2023-04-07 DIAGNOSIS — A419 Sepsis, unspecified organism: Secondary | ICD-10-CM | POA: Diagnosis not present

## 2023-04-07 DIAGNOSIS — R7881 Bacteremia: Secondary | ICD-10-CM | POA: Diagnosis not present

## 2023-04-07 DIAGNOSIS — B955 Unspecified streptococcus as the cause of diseases classified elsewhere: Secondary | ICD-10-CM

## 2023-04-07 DIAGNOSIS — K7460 Unspecified cirrhosis of liver: Secondary | ICD-10-CM | POA: Diagnosis not present

## 2023-04-07 DIAGNOSIS — K7682 Hepatic encephalopathy: Secondary | ICD-10-CM | POA: Diagnosis not present

## 2023-04-07 DIAGNOSIS — D61818 Other pancytopenia: Secondary | ICD-10-CM | POA: Diagnosis not present

## 2023-04-07 DIAGNOSIS — R6520 Severe sepsis without septic shock: Secondary | ICD-10-CM | POA: Diagnosis not present

## 2023-04-07 LAB — CBC
HCT: 34.4 % — ABNORMAL LOW (ref 39.0–52.0)
Hemoglobin: 11.9 g/dL — ABNORMAL LOW (ref 13.0–17.0)
MCH: 32.3 pg (ref 26.0–34.0)
MCHC: 34.6 g/dL (ref 30.0–36.0)
MCV: 93.5 fL (ref 80.0–100.0)
Platelets: 32 10*3/uL — ABNORMAL LOW (ref 150–400)
RBC: 3.68 MIL/uL — ABNORMAL LOW (ref 4.22–5.81)
RDW: 15.9 % — ABNORMAL HIGH (ref 11.5–15.5)
WBC: 5.1 10*3/uL (ref 4.0–10.5)
nRBC: 0 % (ref 0.0–0.2)

## 2023-04-07 LAB — COMPREHENSIVE METABOLIC PANEL
ALT: 29 U/L (ref 0–44)
AST: 71 U/L — ABNORMAL HIGH (ref 15–41)
Albumin: 2.7 g/dL — ABNORMAL LOW (ref 3.5–5.0)
Alkaline Phosphatase: 71 U/L (ref 38–126)
Anion gap: 6 (ref 5–15)
BUN: 16 mg/dL (ref 8–23)
CO2: 23 mmol/L (ref 22–32)
Calcium: 8.4 mg/dL — ABNORMAL LOW (ref 8.9–10.3)
Chloride: 109 mmol/L (ref 98–111)
Creatinine, Ser: 0.99 mg/dL (ref 0.61–1.24)
GFR, Estimated: >60 mL/min (ref >60)
Glucose, Bld: 125 mg/dL — ABNORMAL HIGH (ref 70–99)
Potassium: 3.7 mmol/L (ref 3.5–5.1)
Sodium: 138 mmol/L (ref 135–145)
Total Bilirubin: 2.5 mg/dL — ABNORMAL HIGH (ref 0.3–1.2)
Total Protein: 5.7 g/dL — ABNORMAL LOW (ref 6.5–8.1)

## 2023-04-07 LAB — GLUCOSE, CAPILLARY
Glucose-Capillary: 117 mg/dL — ABNORMAL HIGH (ref 70–99)
Glucose-Capillary: 134 mg/dL — ABNORMAL HIGH (ref 70–99)
Glucose-Capillary: 147 mg/dL — ABNORMAL HIGH (ref 70–99)
Glucose-Capillary: 148 mg/dL — ABNORMAL HIGH (ref 70–99)

## 2023-04-07 LAB — CULTURE, BLOOD (ROUTINE X 2)

## 2023-04-07 MED ORDER — DIAZEPAM 5 MG/ML IJ SOLN
2.5000 mg | Freq: Once | INTRAMUSCULAR | Status: AC
  Administered 2023-04-07: 2.5 mg via INTRAVENOUS
  Filled 2023-04-07: qty 2

## 2023-04-07 MED ORDER — OXYCODONE HCL 5 MG PO TABS
5.0000 mg | ORAL_TABLET | ORAL | Status: DC | PRN
  Administered 2023-04-07 – 2023-04-12 (×4): 5 mg via ORAL
  Filled 2023-04-07 (×4): qty 1

## 2023-04-07 NOTE — Progress Notes (Addendum)
PROGRESS NOTE    Joshua Berry   GUY:[id number] DOB: 09-08-1946  DOA: 04/05/2023 Date of Service: 04/07/23 PCP: [facility]     Brief Narrative / Hospital Course:  Joshua Berry is a 77 year old male with history of NASH liver cirrhosis, hypertension, iron deficiency anemia, chronic thrombocytopenia, who presents from [place] assisted [facility] via EMS on 04/05/23 to emergency department for chief concerns of altered mental status. According to his wife he became progressively altered throughout the day and afternoon yesterday. No recent illnesses, trauma, other acute medical complaints. Last night given his altered mental status did not take his lactulose.  04/20: disoriented to situation but oriented to self. (+)fever, tachypnea, low WBC 3.8 = SIRS/sepsis. Ammonia elevated to 71. Lactic 2.1 --> 1.7. COVID/flu/RSV neg. UA neg for UTI. CXR non-acute. CT head non-acute, (+)chronic microvascular ischemic change and cerebral volume loss. CT C/A/P: liver disease, patent TIPS shunt, severe T12 compression question subacute, no noted infection. Admitted for concern for sepsis / hepatic encephalopathy  04/21: MRI brain non-acute. Ammonia down to 33. BCx (+)strep, change abx to ceftriaxone, Echo ordered, will repeat BCx and ask ID to see tomorrow. PT/OT ordered.  04/22: stable. Repeat BCx. MRI to eval for osteo Tspine given compression fx     Consultants:  none  Procedures: none      ASSESSMENT & PLAN:   Principal Problem:   Severe sepsis Active Problems:   Acute hepatic encephalopathy   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   Chronic diastolic CHF (congestive heart failure)   Hypothyroidism   Overweight (BMI 25.0-29.9)   Secondary esophageal varices without bleeding   HLD (hyperlipidemia)   HTN (hypertension)   Physical deconditioning   Hypomagnesemia   Hepatic encephalopathy    Sepsis w/ positive BCx x4 strep, source likely dental  infection  Blood cultures (+)strep  Cefepime and vancomycin per pharmacy --> ceftriaxone yesterday, today is Day 3 total abx  following culture results Will repeat BCx today  ID consulted  Echo ordered   Compression fracture T-spine MRI ordered to eval further in case osteo   Altered mental status, improving  Likely hepatic encephalopathy + sepsis  MRI of the brain without contrast no concerns     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 twice daily lactulose 30 g p.o. twice daily   Thrombocytopenia D/t liver disease Has been unable to get dental extractions d/t this per daughter  Monitor Plt  If needing surgical procedures may have to get plt transfusion   Hypothyroidism Levothyroxine 100 mcg daily    Hypomagnesemia Magnesium sulfate 1 g IV one-time dose ordered follow magnesium level    HTN (hypertension) Atorvastatin 20 mg nightly   IDDM Type 2 SSI ac Basal insulin Adjust as needed based on Glc      DVT prophylaxis: TED hose, no Rx ppx d/t chronic thrombocytopenia  Pertinent IV fluids/nutrition: no continuous IV fluids, pt alert resumed regular diet  Central lines / invasive devices: none  Code Status: DNR - DNR, admitting hospitalist Dr Sedalia Muta noted she confirmed this with daughter at bedside 04/20. Daughter states patient has a MOST form  ACP documentation reviewed: 04/06/23, 8:01 AM: none on file   Current Admission Status: inpatient  TOC needs / Dispo plan: pend PT/OT eval, may need HH, anticipate d/c back to ALF Barriers to discharge / significant pending items: blood cultures, clinical improvement, if BCx remain negative may be able to d/c tomorrow  Subjective / Brief ROS:  Patient reports no concerns Denies CP/SOB, pain Hasn't had breakfast yet but it is at bedside    Family Communication: daughter is at bedside on rounds     Objective Findings:  Vitals:   04/06/23  1615 04/06/23 1951 04/07/23 0536 04/07/23 0717  BP: 119/75 136/61 138/63 130/84  Pulse: 68 73 87 67  Resp: Temp: 99.9 F (37.7 C) 100 F (37.8 C) 100 F (37.8 C) 98.5 F (36.9 C)  TempSrc:  Oral  Oral  SpO2: 96% 97% 98% 98%  Weight:      Height:        Intake/Output Summary (Last 24 hours) at 04/07/2023 1118 Last data filed at 04/07/2023 0847 Gross per 24 hour  Intake 100 ml  Output 1050 ml  Net -950 ml   Filed Weights   04/05/23 1628  Weight: 104.4 kg    Examination:  Physical Exam Constitutional:      General: He is not in acute distress. HENT:     Mouth/Throat:     Comments: Poor dentition, no obvious abscess Cardiovascular:     Rate and Rhythm: Normal rate and regular rhythm.  Pulmonary:     Effort: Pulmonary effort is normal.     Breath sounds: Normal breath sounds.  Abdominal:     General: Abdomen is flat.     Palpations: Abdomen is soft.  Musculoskeletal:     Right lower leg: No edema.     Left lower leg: No edema.  Skin:    General: Skin is warm.  Neurological:     General: No focal deficit present.     Mental Status: He is alert. Mental status is at baseline.  Psychiatric:        Mood and Affect: Mood normal.        Behavior: Behavior normal.          Scheduled Medications:   acidophilus  1 capsule Oral Daily   ascorbic acid  1,000 mg Oral Daily   atorvastatin  20 mg Oral QHS   diazepam  2.5 mg Intravenous Once   famotidine  20 mg Oral Daily   folic acid  1 mg Oral Daily   insulin aspart  0-9 Units Subcutaneous TID WC   insulin aspart  6 Units Subcutaneous TID WC   insulin glargine-yfgn  10 Units Subcutaneous QHS   lactulose  30 g Oral Once   lactulose  30 g Oral BID   levothyroxine  100 mcg Oral Q0600   magnesium oxide  800 mg Oral BID   melatonin  10 mg Oral QHS   mineral oil-hydrophilic petrolatum   Topical BID   pantoprazole  40 mg Oral BID   rifaximin  550 mg Oral BID    Continuous Infusions:  cefTRIAXone  (ROCEPHIN)  IV Stopped (04/06/23 1450)    PRN Medications:  acetaminophen **OR** acetaminophen, ondansetron **OR** ondansetron (ZOFRAN) IV, senna-docusate, trolamine salicylate  Antimicrobials from admission:  Anti-infectives (From admission, onward)    Start     Dose/Rate Route Frequency Ordered Stop   04/06/23 2200  vancomycin (VANCOREADY) IVPB 2000 mg/400 mL  Status:  Discontinued        2,000 mg 200 mL/hr over 120 Minutes Intravenous Every 24 hours 04/05/23 2054 04/06/23 1224   04/06/23 1400  cefTRIAXone (ROCEPHIN) 2 g in sodium chloride 0.9 % 100 mL IVPB        2 g 200 mL/hr over 30 Minutes Intravenous Every  24 hours 04/06/23 1224     04/05/23 2200  rifaximin (XIFAXAN) tablet 550 mg       Note to Pharmacy: Takes with lactulose     550 mg Oral 2 times daily 04/05/23 1800     04/05/23 2200  vancomycin (VANCOCIN) IVPB 1000 mg/200 mL premix       See Hyperspace for full Linked Orders Report.   1,000 mg 200 mL/hr over 60 Minutes Intravenous  Once 04/05/23 2054 04/06/23 0104   04/05/23 2100  vancomycin (VANCOREADY) IVPB 1500 mg/300 mL       See Hyperspace for full Linked Orders Report.   1,500 mg 150 mL/hr over 120 Minutes Intravenous  Once 04/05/23 2054 04/06/23 0456   04/05/23 2100  ceFEPIme (MAXIPIME) 2 g in sodium chloride 0.9 % 100 mL IVPB  Status:  Discontinued        2 g 200 mL/hr over 30 Minutes Intravenous Every 8 hours 04/05/23 2054 04/06/23 1224           Data Reviewed:  I have personally reviewed the following...  CBC: Recent Labs  Lab 04/05/23 1642 04/06/23 0518 04/07/23 0530  WBC 3.8* 5.5 5.1  NEUTROABS 2.9  --   --   HGB 13.1 12.4* 11.9*  HCT 37.4* 35.4* 34.4*  MCV 93.0 93.2 93.5  PLT 37* 31* 32*   Basic Metabolic Panel: Recent Labs  Lab 04/05/23 1642 04/06/23 0518 04/07/23 0530  NA 137 134* 138  K 3.8 3.8 3.7  CL 107 106 109  CO2 22 20* 23  GLUCOSE 122* 145* 125*  BUN 14 17 16   CREATININE 1.14 1.06 0.99  CALCIUM 9.1 8.6* 8.4*  MG 1.6*  1.8  --    GFR: Estimated Creatinine Clearance: 79.2 mL/min (by C-G formula based on SCr of 0.99 mg/dL). Liver Function Tests: Recent Labs  Lab 04/05/23 1642 04/07/23 0530  AST 53* 71*  ALT 33 29  ALKPHOS 95 71  BILITOT 2.8* 2.5*  PROT 6.6 5.7*  ALBUMIN 3.2* 2.7*   No results for input(s): "LIPASE", "AMYLASE" in the last 168 hours. Recent Labs  Lab 04/05/23 1642 04/06/23 0947  AMMONIA 71* 33   Coagulation Profile: No results for input(s): "INR", "PROTIME" in the last 168 hours. Cardiac Enzymes: Recent Labs  Lab 04/05/23 1642  CKTOTAL 114   BNP (last 3 results) No results for input(s): "PROBNP" in the last 8760 hours. HbA1C: No results for input(s): "HGBA1C" in the last 72 hours. CBG: Recent Labs  Lab 04/06/23 1138 04/06/23 1616 04/06/23 1953 04/06/23 2014 04/07/23 0808  GLUCAP 190* 128* 130* 141* 117*   Lipid Profile: No results for input(s): "CHOL", "HDL", "LDLCALC", "TRIG", "CHOLHDL", "LDLDIRECT" in the last 72 hours. Thyroid Function Tests: No results for input(s): "TSH", "T4TOTAL", "FREET4", "T3FREE", "THYROIDAB" in the last 72 hours. Anemia Panel: No results for input(s): "VITAMINB12", "FOLATE", "FERRITIN", "TIBC", "IRON", "RETICCTPCT" in the last 72 hours. Most Recent Urinalysis On File:     Component Value Date/Time   COLORURINE YELLOW (A) 04/06/2023 0217   APPEARANCEUR CLEAR (A) 04/06/2023 0217   APPEARANCEUR Hazy 05/01/2014 1653   LABSPEC >1.046 (H) 04/06/2023 0217   LABSPEC 1.015 05/01/2014 1653   PHURINE 5.0 04/06/2023 0217   GLUCOSEU NEGATIVE 04/06/2023 0217   GLUCOSEU Negative 05/01/2014 1653   HGBUR SMALL (A) 04/06/2023 0217   BILIRUBINUR NEGATIVE 04/06/2023 0217   BILIRUBINUR Negative 05/01/2014 1653   KETONESUR NEGATIVE 04/06/2023 0217   PROTEINUR 30 (A) 04/06/2023 0217   NITRITE NEGATIVE 04/06/2023 0217  LEUKOCYTESUR NEGATIVE 04/06/2023 0217   LEUKOCYTESUR Negative 05/01/2014 1653   Sepsis  Labs: (procalcitonin:4,lacticidven:4) Microbiology: Recent Results (from the past 240 hour(s))  Resp panel by RT-PCR (RSV, Flu A&B, Covid)     Status: None   Collection Time: 04/05/23  9:24 PM   Specimen: Nasal Swab  Result Value Ref Range Status   SARS Coronavirus 2 by RT PCR NEGATIVE NEGATIVE Final    Comment: (NOTE) SARS-CoV-2 target nucleic acids are NOT DETECTED.  The SARS-CoV-2 RNA is generally detectable in upper respiratory specimens during the acute phase of infection. The lowest concentration of SARS-CoV-2 viral copies this assay can detect is 138 copies/mL. A negative result does not preclude SARS-Cov-2 infection and should not be used as the sole basis for treatment or other patient management decisions. A negative result may occur with  improper specimen collection/handling, submission of specimen other than nasopharyngeal swab, presence of viral mutation(s) within the areas targeted by this assay, and inadequate number of viral copies(<138 copies/mL). A negative result must be combined with clinical observations, patient history, and epidemiological information. The expected result is Negative.  Fact Sheet for Patients:  BloggerCourse.com  Fact Sheet for Healthcare Providers:  SeriousBroker.it  This test is no t yet approved or cleared by the [place] FDA and  has been authorized for detection and/or diagnosis of SARS-CoV-2 by FDA under an Emergency Use Authorization (EUA). This EUA will remain  in effect (meaning this test can be used) for the duration of the COVID-19 declaration under Section 564(b)(1) of the Act, 21 U.S.C.section 360bbb-3(b)(1), unless the authorization is terminated  or revoked sooner.       Influenza A by PCR NEGATIVE NEGATIVE Final   Influenza B by PCR NEGATIVE NEGATIVE Final    Comment: (NOTE) The Xpert Xpress SARS-CoV-2/FLU/RSV plus assay is intended as an aid in the  diagnosis of influenza from Nasopharyngeal swab specimens and should not be used as a sole basis for treatment. Nasal washings and aspirates are unacceptable for Xpert Xpress SARS-CoV-2/FLU/RSV testing.  Fact Sheet for Patients: BloggerCourse.com  Fact Sheet for Healthcare Providers: SeriousBroker.it  This test is not yet approved or cleared by the [place] FDA and has been authorized for detection and/or diagnosis of SARS-CoV-2 by FDA under an Emergency Use Authorization (EUA). This EUA will remain in effect (meaning this test can be used) for the duration of the COVID-19 declaration under Section 564(b)(1) of the Act, 21 U.S.C. section 360bbb-3(b)(1), unless the authorization is terminated or revoked.     Resp Syncytial Virus by PCR NEGATIVE NEGATIVE Final    Comment: (NOTE) Fact Sheet for Patients: BloggerCourse.com  Fact Sheet for Healthcare Providers: SeriousBroker.it  This test is not yet approved or cleared by the [place] FDA and has been authorized for detection and/or diagnosis of SARS-CoV-2 by FDA under an Emergency Use Authorization (EUA). This EUA will remain in effect (meaning this test can be used) for the duration of the COVID-19 declaration under Section 564(b)(1) of the Act, 21 U.S.C. section 360bbb-3(b)(1), unless the authorization is terminated or revoked.  Performed at [facility], [address] [place], [place] [address]   Culture, blood (Routine X 2) w Reflex to ID Panel     Status: Abnormal (Preliminary result)   Collection Time: 04/05/23  9:24 PM   Specimen: BLOOD RIGHT ARM  Result Value Ref Range Status   Specimen Description   Final    BLOOD RIGHT ARM Performed at [facility], [address] [place], [place] [address]    Special Requests   Final    BOTTLES DRAWN AEROBIC AND ANAEROBIC Blood Culture adequate  volume Performed at [facility], [address] [place], [place] [address]    Culture  Setup Time   Final    GRAM POSITIVE COCCI IN BOTH AEROBIC AND ANAEROBIC BOTTLES CRITICAL RESULT CALLED TO, READ BACK BY AND VERIFIED WITH: CAROLYN COULTER AT 1211 04/06/23.PMF  REVIEWED BY A. LAFRANCE Performed at [facility], [address], [place], [place] [address]    Culture STREPTOCOCCUS ANGINOSIS (A)  Final   Report Status PENDING  Incomplete  Culture, blood (Routine X 2) w Reflex to ID Panel     Status: Abnormal (Preliminary result)   Collection Time: 04/05/23  9:24 PM   Specimen: BLOOD LEFT ARM  Result Value Ref Range Status   Specimen Description   Final    BLOOD LEFT ARM Performed at [facility], [address] [place], [place] [address]    Special Requests   Final    BOTTLES DRAWN AEROBIC AND ANAEROBIC Blood Culture results may not be optimal due to an inadequate volume of blood received in culture bottles Performed at [facility], [address] [place], [place] [address]    Culture  Setup Time   Final    GRAM POSITIVE COCCI IN BOTH AEROBIC AND ANAEROBIC BOTTLES CRITICAL RESULT CALLED TO, READ BACK BY AND VERIFIED WITH: CAROLYN COULTER AT 1211 04/06/23.PMF  REVIEWED BY A. LAFRANCE    Culture (A)  Final    STREPTOCOCCUS ANGINOSIS SUSCEPTIBILITIES TO FOLLOW Performed at [facility], [address], [place], [place] [address]    Report Status PENDING  Incomplete  Blood Culture ID Panel (Reflexed)     Status: Abnormal   Collection Time: 04/05/23  9:24 PM  Result Value Ref Range Status   Enterococcus faecalis NOT DETECTED NOT DETECTED Final   Enterococcus Faecium NOT DETECTED NOT DETECTED Final   Listeria monocytogenes NOT DETECTED NOT DETECTED Final   Staphylococcus species NOT DETECTED NOT DETECTED Final   Staphylococcus aureus (BCID) NOT DETECTED NOT DETECTED Final   Staphylococcus epidermidis NOT DETECTED NOT DETECTED Final    Staphylococcus lugdunensis NOT DETECTED NOT DETECTED Final   Streptococcus species DETECTED (A) NOT DETECTED Final    Comment: Not Enterococcus species, Streptococcus agalactiae, Streptococcus pyogenes, or Streptococcus pneumoniae. CRITICAL RESULT CALLED TO, READ BACK BY AND VERIFIED WITH: CAROLYN COULTER AT 1211 04/06/23.PMF    Streptococcus agalactiae NOT DETECTED NOT DETECTED Final   Streptococcus pneumoniae NOT DETECTED NOT DETECTED Final   Streptococcus pyogenes NOT DETECTED NOT DETECTED Final   A.calcoaceticus-baumannii NOT DETECTED NOT DETECTED Final   Bacteroides fragilis NOT DETECTED NOT DETECTED Final   Enterobacterales NOT DETECTED NOT DETECTED Final   Enterobacter cloacae complex NOT DETECTED NOT DETECTED Final   Escherichia coli NOT DETECTED NOT DETECTED Final   Klebsiella aerogenes NOT DETECTED NOT DETECTED Final   Klebsiella oxytoca NOT DETECTED NOT DETECTED Final   Klebsiella pneumoniae NOT DETECTED NOT DETECTED Final   Proteus species NOT DETECTED NOT DETECTED Final   Salmonella species NOT DETECTED NOT DETECTED Final   Serratia marcescens NOT DETECTED NOT DETECTED Final   Haemophilus influenzae NOT DETECTED NOT DETECTED Final   Neisseria meningitidis NOT DETECTED NOT DETECTED Final   Pseudomonas aeruginosa NOT DETECTED NOT DETECTED Final   Stenotrophomonas maltophilia NOT DETECTED NOT DETECTED Final   Candida albicans NOT DETECTED NOT DETECTED Final   Candida  auris NOT DETECTED NOT DETECTED Final   Candida glabrata NOT DETECTED NOT DETECTED Final   Candida krusei NOT DETECTED NOT DETECTED Final   Candida parapsilosis NOT DETECTED NOT DETECTED Final   Candida tropicalis NOT DETECTED NOT DETECTED Final   Cryptococcus neoformans/gattii NOT DETECTED NOT DETECTED Final    Comment: Performed at [facility], [address], [place], [place] [address]      Radiology Studies last 3 days: ECHOCARDIOGRAM COMPLETE  Result Date: 04/06/2023    ECHOCARDIOGRAM REPORT    Patient Name:   SHUAN STATZER Tocco Date of Exam: 04/06/2023 Medical Rec #:  [id number]     Height:       74.0 in Accession #:    [id number]    Weight:       230.2 lb Date of Birth:  05-19-1946     BSA:          2.307 m Patient Age:    77 years      BP:           119/54 mmHg Patient Gender: M             HR:           67 bpm. Exam Location:  [facility] Procedure: 2D Echo Indications:     Bacteremia R78.81  History:         Patient has prior history of Echocardiogram examinations, most                  recent 05/17/2022.                  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  Sonographer:     Overton Mam RDCS, FASE Referring Phys:  [id number] Sunnie Nielsen Diagnosing Phys: Debbe Odea MD  Sonographer Comments: Image acquisition challenging due to respiratory motion and Image acquisition challenging due to patient body habitus. IMPRESSIONS  1. Left ventricular ejection fraction, by estimation, is 55 to 60%. Left ventricular ejection fraction by PLAX is 59 %. The left ventricle has normal function. The left ventricle has no regional wall motion abnormalities. There is mild left ventricular hypertrophy. Left ventricular diastolic parameters are consistent with Grade I diastolic dysfunction (impaired relaxation).  2. Right ventricular systolic function is normal. The right ventricular size is normal.  3. The mitral valve is degenerative. No evidence of mitral valve regurgitation.  4. The aortic valve has been repaired/replaced. Aortic valve regurgitation is mild. 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 Aortic valve mean gradient measures 11.8 mmHg. FINDINGS  Left Ventricle: Left ventricular ejection fraction, by estimation, is 55 to 60%. Left ventricular ejection fraction by PLAX is 59 %. The left ventricle has normal function. The left ventricle has no regional wall motion abnormalities. The left ventricular internal cavity size was normal in size.  There is mild left ventricular hypertrophy. Left ventricular diastolic parameters are consistent with Grade I diastolic dysfunction (impaired relaxation). Right Ventricle: The right ventricular size is normal. No increase in right ventricular wall thickness. Right ventricular systolic function is normal. Left Atrium: Left atrial size was normal in size. Right Atrium: Right atrial size was normal in size. Pericardium: There is no evidence of pericardial effusion. Mitral Valve: The mitral valve is degenerative in appearance. Mild to moderate mitral annular calcification. No evidence of mitral valve regurgitation. Tricuspid Valve: The tricuspid  valve is normal in structure. Tricuspid valve regurgitation is mild. Aortic Valve: The aortic valve has been repaired/replaced. Aortic valve regurgitation is mild. Aortic valve mean gradient measures 11.8 mmHg. Aortic valve peak gradient measures 21.4 mmHg. Aortic valve area, by VTI measures 1.36 cm. 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 Pulmonic Valve: The pulmonic valve was normal in structure. Pulmonic valve regurgitation is mild. Aorta: The aortic root is normal in size and structure. Venous: The inferior vena cava was not well visualized. IAS/Shunts: No atrial level shunt detected by color flow Doppler.  LEFT VENTRICLE PLAX 2D LV EF:         Left            Diastology                ventricular     LV e' medial:    6.09 cm/s                ejection        LV E/e' medial:  17.6                fraction by     LV e' lateral:   10.40 cm/s                PLAX is 59      LV E/e' lateral: 10.3                %. LVIDd:         4.80 cm LVIDs:         3.30 cm LV PW:         1.10 cm LV IVS:        1.30 cm LVOT diam:     2.20 cm LV SV:         67 LV SV Index:   29 LVOT Area:     3.80 cm  RIGHT VENTRICLE RV Basal diam:  3.90 cm RV S prime:     13.20 cm/s TAPSE (M-mode): 2.1 cm LEFT ATRIUM             Index        RIGHT ATRIUM           Index LA diam:         3.60 cm 1.56 cm/m   RA Area:     13.40 cm LA Vol (A2C):   34.0 ml 14.74 ml/m  RA Volume:   29.50 ml  12.79 ml/m LA Vol (A4C):   31.2 ml 13.52 ml/m LA Biplane Vol: 34.1 ml 14.78 ml/m  AORTIC VALVE                     PULMONIC VALVE AV Area (Vmax):    1.40 cm      PV Vmax:        1.44 m/s AV Area (Vmean):   1.40 cm      PV Peak grad:   8.3 mmHg AV Area (VTI):     1.36 cm      RVOT Peak grad: 4 mmHg AV Vmax:           231.50 cm/s AV Vmean:          157.750 cm/s AV VTI:            0.492 m AV Peak Grad:      21.4 mmHg AV Mean Grad:      11.8 mmHg LVOT Vmax:  85.40 cm/s LVOT Vmean:        58.100 cm/s LVOT VTI:          0.176 m LVOT/AV VTI ratio: 0.36  AORTA Ao Root diam: 3.40 cm MITRAL VALVE MV Area (PHT): 2.57 cm     SHUNTS MV Decel Time: 295 msec     Systemic VTI:  0.18 m MV E velocity: 107.00 cm/s  Systemic Diam: 2.20 cm MV A velocity: 132.00 cm/s MV E/A ratio:  0.81 Debbe Odea MD Electronically signed by Debbe Odea MD Signature Date/Time: 04/06/2023/4:06:54 PM    Final    MR BRAIN WO CONTRAST  Result Date: 04/05/2023 CLINICAL DATA:  Altered mental status EXAM: MRI HEAD WITHOUT CONTRAST TECHNIQUE: Multiplanar, multiecho pulse sequences of the brain and surrounding structures were obtained without intravenous contrast. COMPARISON:  None Available. FINDINGS: Brain: No acute infarct, mass effect or extra-axial collection. No acute or chronic hemorrhage. There is multifocal hyperintense T2-weighted signal within the white matter. Generalized volume loss. The midline structures are normal. Vascular: Major flow voids are preserved. Skull and upper cervical spine: Normal calvarium and skull base. Visualized upper cervical spine and soft tissues are normal. Sinuses/Orbits:No paranasal sinus fluid levels or advanced mucosal thickening. No mastoid or middle ear effusion. Normal orbits. IMPRESSION: 1. No acute intracranial abnormality. 2. 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  Electronically Signed   By: Deatra Robinson M.D.   On: 04/05/2023 21:28   CT CHEST ABDOMEN PELVIS W CONTRAST  Result Date: 04/05/2023 CLINICAL DATA:  Sepsis EXAM: CT CHEST, ABDOMEN, AND PELVIS WITH CONTRAST TECHNIQUE: Multidetector CT imaging of the chest, abdomen and pelvis was performed following the standard protocol during bolus administration of intravenous contrast. RADIATION DOSE REDUCTION: This exam was performed according to the departmental dose-optimization program which includes automated exposure control, adjustment of the mA and/or kV according to patient size and/or use of iterative reconstruction technique. CONTRAST:  OMNIPAQUE IOHEXOL 300 MG/ML  SOLN COMPARISON:  Chest x-ray earlier 04/05/2023. CT angiogram chest 03/14/2022. Abdomen pelvis CT 07/20/2019. Multiple other comparison exams FINDINGS: CT CHEST FINDINGS Cardiovascular: Status post TAVR. Heart is nonenlarged. No pericardial effusion. The thoracic aorta overall has a normal course and caliber. Coronary artery calcifications are seen. Mediastinum/Nodes: Mildly patulous esophagus. No specific abnormal lymph node enlargement identified in the axillary region, hilum or mediastinum. Lungs/Pleura: No consolidation, pneumothorax or effusion. Breathing motion seen throughout the examination. There is some dependent atelectasis seen with some also areas of scarring and fibrotic change. Musculoskeletal: Scattered degenerative changes along the spine. There are multiple posterior endplate osteophytes identified with areas of stenosis in the thoracic spine region. There is severe compression of the T12 vertebral level. This was not seen on the prior CT scan. There is some lucencies along the inferior endplate with some air. This could be a benign compression but could be more acute to subacute. Please correlate with clinical history. Streak artifact related to the patient's left shoulder arthroplasty. Gynecomastia. CT ABDOMEN PELVIS FINDINGS  Hepatobiliary: Nodular liver consistent with chronic liver disease. 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 Main portal vein is patent with diameter at the liver hilum approaching 18 mm. Pancreas: Unremarkable. No pancreatic ductal dilatation or surrounding inflammatory changes. Spleen: Small splenule at the hilum. Spleen has a cephalocaudal length approaching 20 cm. Preserved enhancement. Adrenals/Urinary Tract: Adrenal glands are preserved. Stable lower pole left-sided renal cyst with a parapelvic component measuring 4 cm. Bosniak 1 lesion. There are some small exophytic  similar cysts on the right side. No specific follow-up. Preserved contours of the urinary bladder. Mild bladder wall thickening. Stomach/Bowel: Scattered colonic stool. There is scattered diffuse colonic diverticula. Bowel is nondilated. Stomach is underdistended. Small bowel is nondilated. Vascular/Lymphatic: Normal caliber aorta and IVC with scattered mild vascular calcifications. No specific abnormal lymph node enlargement seen in the abdomen and pelvis. Reproductive: Enlarged prostate with mass effect along the base of the bladder. Other: Trace ascites in the pelvis. There is motion throughout the examination limiting evaluation particularly of the bowel and mesentery. Musculoskeletal: Advanced degenerative changes along the spine with multilevel stenosis. Again compression of T12. streak artifact as the patient's arms were scanned at the patient's side across the abdomen and pelvis. IMPRESSION: Evidence of chronic liver disease with a nodular heterogeneous liver, splenomegaly. Trace ascites. Patent tips shunt. Colonic diverticulosis. No bowel obstruction or free air. No rim enhancing fluid collections. No consolidation, pneumothorax or effusion. Severe compression of the T12 level which was not seen previously. Based on appearance this could be subacute. Please correlate with specific clinical history and  symptomatology. Electronically Signed   By: Karen Kays M.D.   On: 04/05/2023 20:36   CT Head Wo Contrast  Result Date: 04/05/2023 CLINICAL DATA:  Mental status change, unknown cause EXAM: CT HEAD WITHOUT CONTRAST TECHNIQUE: Contiguous axial images were obtained from the base of the skull through the vertex without intravenous contrast. RADIATION DOSE REDUCTION: This exam was performed according to the departmental dose-optimization program which includes automated exposure control, adjustment of the mA and/or kV according to patient size and/or use of iterative reconstruction technique. COMPARISON:  08/02/2022 FINDINGS: Brain: No evidence of acute infarction, hemorrhage, hydrocephalus, extra-axial collection or mass lesion/mass effect. Patchy low-density changes within the periventricular and subcortical white matter most compatible with chronic microvascular ischemic change. Mild diffuse cerebral volume loss. Vascular: No hyperdense vessel or unexpected calcification. Skull: Normal. Negative for fracture or focal lesion. Sinuses/Orbits: No acute finding. Other: None. IMPRESSION: 1. No acute intracranial abnormality. 2. Chronic microvascular ischemic change and cerebral volume loss. Electronically Signed   By: Duanne Guess D.O.   On: 04/05/2023 17:13   DG Chest Port 1 View  Result Date: 04/05/2023 CLINICAL DATA:  Altered mental status EXAM: PORTABLE CHEST 1 VIEW COMPARISON:  CXR 07/31/22 FINDINGS: Status post TAVR. Possible trace left pleural effusion. No pneumothorax unchanged cardiac and mediastinal contours. No focal airspace opacity. There are prominent interstitial opacities, unchanged from prior exam. No radiographically apparent displaced rib fractures. Visualized upper abdomen is unremarkable. Surgical clips in the right upper quadrant. IMPRESSION: No focal airspace opacity. Electronically Signed   By: Lorenza Cambridge M.D.   On: 04/05/2023 16:52             LOS: 2 days        Sunnie Nielsen, DO [facility] 04/07/2023, 11:18 AM    Dictation software may have been used to generate the above note. Typos may occur and escape review in typed/dictated notes. Please contact Dr Lyn Hollingshead directly for clarity if needed.  Staff may message me via secure chat in Epic  but this may not receive an immediate response,  please page me for urgent matters!  If 7PM-7AM, please contact night coverage [email or web address]

## 2023-04-07 NOTE — Progress Notes (Signed)
PT Cancellation Note  Patient Details Name: Joshua Berry MRN: [id number] DOB: 24-Aug-1946   Cancelled Treatment:     Pt off floor at MRI of T-Spine, therapist returned in pm, pt politely declined due to significant fatigue and unable to tolerate skilled PT session. Continue per POC next available date/time.   Jannet Askew 04/07/2023, 4:55 PM

## 2023-04-07 NOTE — TOC Initial Note (Signed)
Transition of Care [facility]) - Initial/Assessment Note    Patient Details  Name: Joshua Berry MRN: [id number] Date of Birth: 11-22-1946  [facility]) CM/SW Contact:    Allena Katz, LCSW Phone Number: 04/07/2023, 4:19 PM  Clinical Narrative:   Pt from [facility]. [facility] called stating they are unable to take pt back at the current level of care required and requested PT eval for SNF. TOC shared with MD and therapy team.                       Patient Goals and CMS Choice            Expected Discharge Plan and Services                                              Prior Living Arrangements/Services                       Activities of Daily Living Home Assistive Devices/Equipment: Walker (specify type) ADL Screening (condition at time of admission) Patient's cognitive ability adequate to safely complete daily activities?: Yes Is the patient deaf or have difficulty hearing?: No Does the patient have difficulty seeing, even when wearing glasses/contacts?: No Does the patient have difficulty concentrating, remembering, or making decisions?: Yes Patient able to express need for assistance with ADLs?: Yes Does the patient have difficulty dressing or bathing?: Yes Independently performs ADLs?: Yes (appropriate for developmental age) Does the patient have difficulty walking or climbing stairs?: No Weakness of Legs: None Weakness of Arms/Hands: None  Permission Sought/Granted                  Emotional Assessment              Admission diagnosis:  Hepatic encephalopathy [K76.82] Altered mental status [R41.82] Altered mental status, unspecified altered mental status type [R41.82] Patient Active Problem List   Diagnosis Date Noted   Streptococcal bacteremia 04/07/2023   Altered mental status 04/06/2023   Hepatic encephalopathy 04/05/2023   Overweight (BMI 25.0-29.9) 08/03/2022   Hyponatremia, hypokalemia and hypomagnesemia  05/18/2022   Hyponatremia 05/18/2022   Hypokalemia 05/18/2022   Back pain 05/17/2022   Hypomagnesemia 05/17/2022   Acute hepatic encephalopathy 05/17/2022   AKI (acute kidney injury) 05/16/2022   Elevated liver enzymes 05/16/2022   Hypotension 05/16/2022   Right shoulder pain 05/16/2022   Hypothyroidism 05/16/2022   Physical deconditioning 05/16/2022   Obesity (BMI 30-39.9) 05/16/2022   Lactic acidosis 05/16/2022   Fall at home, initial encounter 05/16/2022   Septic shock due to Staphylococcus bacteremia 05/14/2022   Chest pain 04/06/2022   Controlled IDDM-2 with hyperglycemia 04/06/2022   HLD (hyperlipidemia) 04/06/2022   HTN (hypertension) 04/06/2022   Chronic diastolic CHF (congestive heart failure) 04/06/2022   Pancytopenia 04/06/2022   GI bleeding 04/24/2021   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 [place] [address] Phone: [phone] Fax: [phone]     Social Determinants of Health (SDOH) Social History: SDOH Screenings  Food Insecurity: No Food Insecurity (04/06/2023)  Housing: Low Risk  (04/06/2023)  Transportation Needs: No Transportation Needs (04/06/2023)  Utilities: Not At Risk (04/06/2023)  Depression (PHQ2-9): Low Risk  (01/07/2022)  Recent Concern: Depression (PHQ2-9) - Medium Risk (12/04/2021)  Tobacco Use: Medium Risk (04/05/2023)   SDOH Interventions:     Readmission Risk Interventions    05/17/2022    1:45 PM  Readmission Risk Prevention Plan  Transportation Screening Complete  PCP or Specialist Appt within 5-7 Days Complete  Home Care Screening Complete  Medication Review (RN CM) Complete

## 2023-04-07 NOTE — Consult Note (Signed)
NAME: Joshua Berry  DOB: May 26, 1946  MRN: [id number]  Date/Time: 04/07/2023 10:09 PM  REQUESTING PROVIDER: Dr.Alexander Subjective:  REASON FOR CONSULT: Streptococcus anginosus bacteremia ? Joshua Berry is a 77 y.o. 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 Staph capri bacteremia in June 2023 treated with 4 weeks of Iv cefazolin  Presented to the ED on 04/05/23 with confusion and altered mental status In the ED vitals were BP of 120/65, initial temperature was 100 which later went to home 102.6 and  HR of 84 ,sats of 99%. Labs revealed a WBC of 3.8, platelet of 37, Hb of 13.1 and creatinine of 1.14.  CT head was no acute abnormality seen, Blood culture was sent.  CT chest and abdomen revealed evidence of chronic liver disease with nodular heterogenic liver, splenomegaly, trace ascites with patent TIPS shunt.  There was colonic diverticulosis but no abscess.  Chest did not reveal any consolidation or pneumothorax or effusion.  There was severe compression of the T12 vertebrae which was not seen on the previous CT scans.  Ammonia was elevated and so patient was started on lactulose and IV cefepime and vancomycin was given after sending blood cultures.  The blood culture came positive for Streptococcus anginosus in both sets and I am seeing the patient.  Patient is currently on ceftriaxone..  2D echo did not reveal any obvious vegetation.  The bioprosthetic aortic valve was noted.  MRI of the spine revealed an old inferior endplate fracture at T5 and an old compression fracture at T12 with more recent worsening in the inferior T12 vertebral body portion.  There was no evidence of any  infection.    PMH DM Cirrhosis liver Aortic stenosis Hypothyroidism OSA NASH Esophageal varices GAVE BPH Malignant melanoma Group B strep bacteremia in 2020 with ? MRI then showed glenohumeral infection with epidural enhancement MRI neck -Mild smooth epidural thickening and enhancement posteriorly extending from C2 to below the field of view into the thoracic spine as well as diffuse paraspinal muscle enhancement. Findings may represent a primary myositis (rhabdomyolysis, infection, autoimmune) with reactive epidural inflammation, however, epidural infection is not excluded.  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  11/2021 Total shoulder arthroplasty left Upper GI endoscopy X 14  Cholecystectomy Variceal banding  X 9   Social History   Socioeconomic History   Marital status: Married    Spouse name: Not on file   Number of children: Not on file   Years of education: Not on file   Highest education level: Not on file  Occupational History   Not on file  Tobacco Use   Smoking status: Former    Packs/day: 0.50    Years: 5.00    Additional pack years: 0.00    Total pack years: 2.50    Types: Cigarettes    Quit date: 10/16/1968    Years since quitting: 54.5   Smokeless tobacco: Never  Vaping Use   Vaping Use: Never used  Substance and Sexual Activity   Alcohol use: Not Currently   Drug use: Not Currently   Sexual activity: Not Currently  Other Topics Concern   Not on file  Social History Narrative   Not on file  Social Determinants of Health   Financial Resource Strain: Not on file  Food Insecurity: No Food Insecurity (04/06/2023)   Hunger Vital Sign    Worried About Running Out of Food in the Last Year: Never true    Ran Out of Food in the Last Year: Never true  Transportation Needs: No Transportation Needs (04/06/2023)   PRAPARE - Administrator, Civil Service (Medical): No    Lack of Transportation (Non-Medical): No   Physical Activity: Not on file  Stress: Not on file  Social Connections: Not on file  Intimate Partner Violence: Unknown (04/06/2023)   Humiliation, Afraid, Rape, and Kick questionnaire    Fear of Current or Ex-Partner: No    Emotionally Abused: No    Physically Abused: No    Sexually Abused: Not on file    Family History  Problem Relation Age of Onset   Heart attack Brother    Allergies  Allergen Reactions   Nsaids     Other Reaction(s): Ascites   Camphor Rash and Swelling   Nickel Rash   Sulfur Rash and Swelling   Sulfamethoxazole Swelling   Latex Rash   Lisinopril Hives    Other Reaction(s): Cough   I? Current Facility-Administered Medications  Medication Dose Route Frequency Provider Last Rate Last Admin   acetaminophen (TYLENOL) tablet 650 mg  650 mg Oral Q6H PRN Cox, Amy N, DO   650 mg at 04/07/23 1252   Or   acetaminophen (TYLENOL) suppository 650 mg  650 mg Rectal Q6H PRN Cox, Amy N, DO       acidophilus (RISAQUAD) capsule 1 capsule  1 capsule Oral Daily Cox, Amy N, DO   1 capsule at 04/07/23 1000   ascorbic acid (VITAMIN C) tablet 1,000 mg  1,000 mg Oral Daily Cox, Amy N, DO   1,000 mg at 04/07/23 0949   atorvastatin (LIPITOR) tablet 20 mg  20 mg Oral QHS Cox, Amy N, DO   20 mg at 04/07/23 2046   cefTRIAXone (ROCEPHIN) 2 g in sodium chloride 0.9 % 100 mL IVPB  2 g Intravenous Q24H Sunnie Nielsen, DO 200 mL/hr at 04/07/23 1410 2 g at 04/07/23 1410   famotidine (PEPCID) tablet 20 mg  20 mg Oral Daily Cox, Amy N, DO   20 mg at 04/07/23 0951   folic acid (FOLVITE) tablet 1 mg  1 mg Oral Daily Cox, Amy N, DO   1 mg at 04/07/23 0950   insulin aspart (novoLOG) injection 0-9 Units  0-9 Units Subcutaneous TID WC Sunnie Nielsen, DO   1 Units at 04/07/23 1704   insulin aspart (novoLOG) injection 6 Units  6 Units Subcutaneous TID WC Sunnie Nielsen, DO   6 Units at 04/07/23 1704   insulin glargine-yfgn (SEMGLEE) injection 10 Units  10 Units Subcutaneous QHS Sunnie Nielsen, DO   10 Units at 04/07/23 2046   lactulose (CHRONULAC) 10 GM/15ML solution 30 g  30 g Oral Once Cox, Amy N, DO       lactulose (CHRONULAC) 10 GM/15ML solution 30 g  30 g Oral BID Cox, Amy N, DO   30 g at 04/07/23 2046   levothyroxine (SYNTHROID) tablet 100 mcg  100 mcg Oral Q0600 Cox, Amy N, DO   100 mcg at 04/07/23 0551   magnesium oxide (MAG-OX) tablet 800 mg  800 mg Oral BID Cox, Amy N, DO   800 mg at 04/07/23 2046   melatonin tablet 10 mg  10 mg Oral QHS Cox,  Amy N, DO   10 mg at 04/07/23 2045   mineral oil-hydrophilic petrolatum (AQUAPHOR) ointment   Topical BID Cox, Amy N, DO   Given at 04/07/23 1052   ondansetron (ZOFRAN) tablet 4 mg  4 mg Oral Q6H PRN Cox, Amy N, DO       Or   ondansetron (ZOFRAN) injection 4 mg  4 mg Intravenous Q6H PRN Cox, Amy N, DO       oxyCODONE (Oxy IR/ROXICODONE) immediate release tablet 5 mg  5 mg Oral Q4H PRN Sunnie Nielsen, DO   5 mg at 04/07/23 1432   pantoprazole (PROTONIX) EC tablet 40 mg  40 mg Oral BID Cox, Amy N, DO   40 mg at 04/07/23 2046   rifaximin (XIFAXAN) tablet 550 mg  550 mg Oral BID Cox, Amy N, DO   550 mg at 04/07/23 2045   senna-docusate (Senokot-S) tablet 1 tablet  1 tablet Oral QHS PRN Cox, Amy N, DO       trolamine salicylate (ASPERCREME) 10 % cream   Topical PRN Cox, Amy N, DO         Abtx:  Anti-infectives (From admission, onward)    Start     Dose/Rate Route Frequency Ordered Stop   04/06/23 2200  vancomycin (VANCOREADY) IVPB 2000 mg/400 mL  Status:  Discontinued        2,000 mg 200 mL/hr over 120 Minutes Intravenous Every 24 hours 04/05/23 2054 04/06/23 1224   04/06/23 1400  cefTRIAXone (ROCEPHIN) 2 g in sodium chloride 0.9 % 100 mL IVPB        2 g 200 mL/hr over 30 Minutes Intravenous Every 24 hours 04/06/23 1224     04/05/23 2200  rifaximin (XIFAXAN) tablet 550 mg       Note to Pharmacy: Takes with lactulose     550 mg Oral 2 times daily 04/05/23 1800     04/05/23 2200  vancomycin (VANCOCIN) IVPB 1000 mg/200 mL  premix       See Hyperspace for full Linked Orders Report.   1,000 mg 200 mL/hr over 60 Minutes Intravenous  Once 04/05/23 2054 04/06/23 0104   04/05/23 2100  vancomycin (VANCOREADY) IVPB 1500 mg/300 mL       See Hyperspace for full Linked Orders Report.   1,500 mg 150 mL/hr over 120 Minutes Intravenous  Once 04/05/23 2054 04/06/23 0456   04/05/23 2100  ceFEPIme (MAXIPIME) 2 g in sodium chloride 0.9 % 100 mL IVPB  Status:  Discontinued        2 g 200 mL/hr over 30 Minutes Intravenous Every 8 hours 04/05/23 2054 04/06/23 1224       REVIEW OF SYSTEMS:  Const: negative fever, negative chills, negative weight loss Eyes: negative diplopia or visual changes, negative eye pain ENT: negative coryza, negative sore throat Resp: negative cough, hemoptysis, dyspnea Cards: negative for chest pain, palpitations, lower extremity edema GU: negative for frequency, dysuria and hematuria GI: Negative for abdominal pain, diarrhea, bleeding, constipation Skin: negative for rash and pruritus Heme: negative for easy bruising and gum/nose bleeding MS: muscle weakness Neurolo:some confusion which is better now  Psych: negative for feelings of anxiety, depression  Endocrine: negative for thyroid, diabetes Allergy/Immunology- negative for any medication or food allergies ?  Objective:  VITALS:  BP (!) 109/58 (BP Location: Right Arm)   Pulse (!) 59   Temp 98.6 F (37 C) (Oral)   Resp 19   Ht 6\' 2"  (1.88 m)   Wt 104.4 kg  SpO2 99%   BMI 29.56 kg/m  LDA Foley Central line Other drainage tubes PHYSICAL EXAM:  General: Alert, cooperative, no distress, appears stated age.  Head: Normocephalic, without obvious abnormality, atraumatic. Eyes: Conjunctivae clear, anicteric sclerae. Pupils are equal ENT Nares normal. No drainage or sinus tenderness. Lips, mucosa, and tongue normal. No Thrush Neck: Supple, symmetrical, no adenopathy, thyroid: non tender no carotid bruit and no JVD. Back: No CVA  tenderness. Lungs: Clear to auscultation bilaterally. No Wheezing or Rhonchi. No rales. Heart: Regular rate and rhythm, no murmur, rub or gallop. Abdomen: Soft, non-tender,not distended. Bowel sounds normal. No masses Extremities: atraumatic, no cyanosis. No edema. No clubbing Skin: No rashes or lesions. Or bruising Lymph: Cervical, supraclavicular normal. Neurologic: Grossly non-focal Pertinent Labs Lab Results CBC    Component Value Date/Time   WBC 5.1 04/07/2023 0530   RBC 3.68 (L) 04/07/2023 0530   HGB 11.9 (L) 04/07/2023 0530   HGB 12.3 (L) 05/01/2014 1553   HCT 34.4 (L) 04/07/2023 0530   HCT 36.6 (L) 05/01/2014 1553   PLT 32 (L) 04/07/2023 0530   PLT 72 (L) 05/01/2014 1553   MCV 93.5 04/07/2023 0530   MCV 96 05/01/2014 1553   MCH 32.3 04/07/2023 0530   MCHC 34.6 04/07/2023 0530   RDW 15.9 (H) 04/07/2023 0530   RDW 15.8 (H) 05/01/2014 1553   LYMPHSABS 0.4 (L) 04/05/2023 1642   MONOABS 0.4 04/05/2023 1642   EOSABS 0.0 04/05/2023 1642   BASOSABS 0.0 04/05/2023 1642       Latest Ref Rng & Units 04/07/2023    5:30 AM 04/06/2023    5:18 AM 04/05/2023    4:42 PM  CMP  Glucose 70 - 99 mg/dL 409  811  914   BUN 8 - 23 mg/dL 16  17  14    Creatinine 0.61 - 1.24 mg/dL 7.82  9.56  2.13   Sodium 135 - 145 mmol/L 138  134  137   Potassium 3.5 - 5.1 mmol/L 3.7  3.8  3.8   Chloride 98 - 111 mmol/L 109  106  107   CO2 22 - 32 mmol/L 23  20  22    Calcium 8.9 - 10.3 mg/dL 8.4  8.6  9.1   Total Protein 6.5 - 8.1 g/dL 5.7   6.6   Total Bilirubin 0.3 - 1.2 mg/dL 2.5   2.8   Alkaline Phos 38 - 126 U/L 71   95   AST 15 - 41 U/L 71   53   ALT 0 - 44 U/L 29   33       Microbiology: Recent Results (from the past 240 hour(s))  Resp panel by RT-PCR (RSV, Flu A&B, Covid)     Status: None   Collection Time: 04/05/23  9:24 PM   Specimen: Nasal Swab  Result Value Ref Range Status   SARS Coronavirus 2 by RT PCR NEGATIVE NEGATIVE Final    Comment: (NOTE) SARS-CoV-2 target nucleic acids  are NOT DETECTED.  The SARS-CoV-2 RNA is generally detectable in upper respiratory specimens during the acute phase of infection. The lowest concentration of SARS-CoV-2 viral copies this assay can detect is 138 copies/mL. A negative result does not preclude SARS-Cov-2 infection and should not be used as the sole basis for treatment or other patient management decisions. A negative result may occur with  improper specimen collection/handling, submission of specimen other than nasopharyngeal swab, presence of viral mutation(s) within the areas targeted by this assay, and inadequate number of viral  copies(<138 copies/mL). A negative result must be combined with clinical observations, patient history, and epidemiological information. The expected result is Negative.  Fact Sheet for Patients:  BloggerCourse.com  Fact Sheet for Healthcare Providers:  SeriousBroker.it  This test is no t yet approved or cleared by the [place] FDA and  has been authorized for detection and/or diagnosis of SARS-CoV-2 by FDA under an Emergency Use Authorization (EUA). This EUA will remain  in effect (meaning this test can be used) for the duration of the COVID-19 declaration under Section 564(b)(1) of the Act, 21 U.S.C.section 360bbb-3(b)(1), unless the authorization is terminated  or revoked sooner.       Influenza A by PCR NEGATIVE NEGATIVE Final   Influenza B by PCR NEGATIVE NEGATIVE Final    Comment: (NOTE) The Xpert Xpress SARS-CoV-2/FLU/RSV plus assay is intended as an aid in the diagnosis of influenza from Nasopharyngeal swab specimens and should not be used as a sole basis for treatment. Nasal washings and aspirates are unacceptable for Xpert Xpress SARS-CoV-2/FLU/RSV testing.  Fact Sheet for Patients: BloggerCourse.com  Fact Sheet for Healthcare Providers: SeriousBroker.it  This test is  not yet approved or cleared by the [place] FDA and has been authorized for detection and/or diagnosis of SARS-CoV-2 by FDA under an Emergency Use Authorization (EUA). This EUA will remain in effect (meaning this test can be used) for the duration of the COVID-19 declaration under Section 564(b)(1) of the Act, 21 U.S.C. section 360bbb-3(b)(1), unless the authorization is terminated or revoked.     Resp Syncytial Virus by PCR NEGATIVE NEGATIVE Final    Comment: (NOTE) Fact Sheet for Patients: BloggerCourse.com  Fact Sheet for Healthcare Providers: SeriousBroker.it  This test is not yet approved or cleared by the [place] FDA and has been authorized for detection and/or diagnosis of SARS-CoV-2 by FDA under an Emergency Use Authorization (EUA). This EUA will remain in effect (meaning this test can be used) for the duration of the COVID-19 declaration under Section 564(b)(1) of the Act, 21 U.S.C. section 360bbb-3(b)(1), unless the authorization is terminated or revoked.  Performed at [facility], [address] [place], [place] [address]   Culture, blood (Routine X 2) w Reflex to ID Panel     Status: Abnormal (Preliminary result)   Collection Time: 04/05/23  9:24 PM   Specimen: BLOOD RIGHT ARM  Result Value Ref Range Status   Specimen Description   Final    BLOOD RIGHT ARM Performed at [facility], [address], [place], [place] [address]    Special Requests   Final    BOTTLES DRAWN AEROBIC AND ANAEROBIC Blood Culture adequate volume Performed at [facility], [address] [place], [place] [address]    Culture  Setup Time   Final    GRAM POSITIVE COCCI IN BOTH AEROBIC AND ANAEROBIC BOTTLES CRITICAL RESULT CALLED TO, READ BACK BY AND VERIFIED WITH: CAROLYN COULTER AT 1211 04/06/23.PMF  REVIEWED BY A. LAFRANCE Performed at [facility], [address], [place], [place] [address]     Culture STREPTOCOCCUS ANGINOSIS (A)  Final   Report Status PENDING  Incomplete  Culture, blood (Routine X 2) w Reflex to ID Panel     Status: Abnormal (Preliminary result)   Collection Time: 04/05/23  9:24 PM   Specimen: BLOOD LEFT ARM  Result Value Ref Range Status   Specimen Description   Final    BLOOD LEFT ARM Performed at [facility], [address], [place], [place] [address]    Special Requests   Final    BOTTLES DRAWN AEROBIC AND ANAEROBIC Blood Culture results may not be optimal due to an inadequate volume of blood received in culture bottles Performed at [facility], [address] [place], [place] [address]    Culture  Setup Time   Final    GRAM POSITIVE COCCI IN BOTH AEROBIC AND ANAEROBIC BOTTLES CRITICAL RESULT CALLED TO, READ BACK BY AND VERIFIED WITH: CAROLYN COULTER AT 1211 04/06/23.PMF  REVIEWED BY A. LAFRANCE    Culture (A)  Final    STREPTOCOCCUS ANGINOSIS SUSCEPTIBILITIES TO FOLLOW Performed at [facility], [address] [place], [place] [address]    Report Status PENDING  Incomplete  Blood Culture ID Panel (Reflexed)     Status: Abnormal   Collection Time: 04/05/23  9:24 PM  Result Value Ref Range Status   Enterococcus faecalis NOT DETECTED NOT DETECTED Final   Enterococcus Faecium NOT DETECTED NOT DETECTED Final   Listeria monocytogenes NOT DETECTED NOT DETECTED Final   Staphylococcus species NOT DETECTED NOT DETECTED Final   Staphylococcus aureus (BCID) NOT DETECTED NOT DETECTED Final   Staphylococcus epidermidis NOT DETECTED NOT DETECTED Final   Staphylococcus lugdunensis NOT DETECTED NOT DETECTED Final   Streptococcus species DETECTED (A) NOT DETECTED Final    Comment: Not Enterococcus species, Streptococcus agalactiae, Streptococcus pyogenes, or Streptococcus pneumoniae. CRITICAL RESULT CALLED TO, READ BACK BY AND VERIFIED WITH: CAROLYN COULTER AT 1211 04/06/23.PMF    Streptococcus agalactiae NOT DETECTED NOT DETECTED  Final   Streptococcus pneumoniae NOT DETECTED NOT DETECTED Final   Streptococcus pyogenes NOT DETECTED NOT DETECTED Final   A.calcoaceticus-baumannii NOT DETECTED NOT DETECTED Final   Bacteroides fragilis NOT DETECTED NOT DETECTED Final   Enterobacterales NOT DETECTED NOT DETECTED Final   Enterobacter cloacae complex NOT DETECTED NOT DETECTED Final   Escherichia coli NOT DETECTED NOT DETECTED Final   Klebsiella aerogenes NOT DETECTED NOT DETECTED Final   Klebsiella oxytoca NOT DETECTED NOT DETECTED Final   Klebsiella pneumoniae NOT DETECTED NOT DETECTED Final   Proteus species NOT DETECTED NOT DETECTED Final   Salmonella species NOT DETECTED NOT DETECTED Final   Serratia marcescens NOT DETECTED NOT DETECTED Final   Haemophilus influenzae NOT DETECTED NOT DETECTED Final   Neisseria meningitidis NOT DETECTED NOT DETECTED Final   Pseudomonas aeruginosa NOT DETECTED NOT DETECTED Final   Stenotrophomonas maltophilia NOT DETECTED NOT DETECTED Final   Candida albicans NOT DETECTED NOT DETECTED Final   Candida auris NOT DETECTED NOT DETECTED Final   Candida glabrata NOT DETECTED NOT DETECTED Final   Candida krusei NOT DETECTED NOT DETECTED Final   Candida parapsilosis NOT DETECTED NOT DETECTED Final   Candida tropicalis NOT DETECTED NOT DETECTED Final   Cryptococcus neoformans/gattii NOT DETECTED NOT DETECTED Final    Comment: Performed at [facility], 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 ? ___________________________________________________ Discussed with patient Note:  This document was prepared using Dragon voice  recognition software and may include unintentional dictation errors.

## 2023-04-07 NOTE — Evaluation (Signed)
Occupational Therapy Evaluation Patient Details Name: Joshua Berry MRN: [id number] DOB: [birth year] Today's Date: 04/07/2023   History of Present Illness Patient is a 77 year old male with chief complaint of AMS, found to be septic with infection source unclear. History of Joshua Berry, liver cirrhosis, hypertension, iron deficiency anemia, chronic thrombocytopenia   Clinical Impression   Patient agreeable to OT evaluation. Pt presenting with decreased independence in self care, balance, functional mobility/transfers, endurance, and safety awareness. PTA pt lived in ALF with spouse. Pt currently functioning at Mod-Max A for bed mobility, Mod A for STS from EOB, Min A to take several lateral steps toward HOB using a RW, and Min A for seated LB dressing. Pt endorsed 6/10 LBP with activity (RN notified). Pt will benefit from skilled acute OT services to address deficits noted below. OT recommends ongoing therapy upon discharge to maximize safety and independence with ADLs, decrease fall risk, decrease caregiver burden, and promote return to PLOF.     Recommendations for follow up therapy are one component of a multi-disciplinary discharge planning process, led by the attending physician.  Recommendations may be updated based on patient status, additional functional criteria and insurance authorization.   Assistance Recommended at Discharge Frequent or constant Supervision/Assistance  Patient can return home with the following A lot of help with walking and/or transfers;Assistance with cooking/housework;Assist for transportation;Help with stairs or ramp for entrance;Direct supervision/assist for medications management;A lot of help with bathing/dressing/bathroom    Functional Status Assessment  Patient has had a recent decline in their functional status and demonstrates the ability to make significant improvements in function in a reasonable and predictable amount of time.  Equipment Recommendations  Other  (comment) (defer to next venue of care)    Recommendations for Other Services       Precautions / Restrictions Precautions Precautions: Fall Restrictions Weight Bearing Restrictions: No      Mobility Bed Mobility Overal bed mobility: Needs Assistance Bed Mobility: Supine to Sit, Sit to Supine     Supine to sit: HOB elevated, Max assist (assist for trunk elevation) Sit to supine: Mod assist (assist for BLE management)   General bed mobility comments: increased time/effort required, Max A to scoot hips forward at EOB    Transfers Overall transfer level: Needs assistance Equipment used: Rolling walker (2 wheels) Transfers: Sit to/from Stand Sit to Stand: Mod assist, From elevated surface           General transfer comment: VC for hand placement and sequencing      Balance Overall balance assessment: Needs assistance Sitting-balance support: Feet supported Sitting balance-Leahy Scale: Fair     Standing balance support: Reliant on assistive device for balance, Bilateral upper extremity supported, During functional activity Standing balance-Leahy Scale: Fair         ADL either performed or assessed with clinical judgement   ADL Overall ADL's : Needs assistance/impaired     Grooming: Set up;Sitting;Wash/dry face               Lower Body Dressing: Minimal assistance;Sitting/lateral leans Lower Body Dressing Details (indicate cue type and reason): to don slip on shoes sitting EOB Toilet Transfer: Rolling walker (2 wheels);Moderate assistance;Cueing for safety;Cueing for sequencing Toilet Transfer Details (indicate cue type and reason): simulated         Functional mobility during ADLs: Rolling walker (2 wheels);Cueing for safety;Cueing for sequencing;Minimal assistance (to take ~4 lateral steps at EOB, assist for RW management)       Vision Baseline Vision/History:  1 Wears glasses Ability to See in Adequate Light: 1 Impaired Patient Visual Report:  No change from baseline Additional Comments: Pt able to read larger print in hospital room service menu     Perception     Praxis      Pertinent Vitals/Pain Pain Assessment Pain Assessment: 0-10 Pain Score: 6  Pain Location: LBP Pain Descriptors / Indicators: Grimacing, Aching, Sore Pain Intervention(s): Limited activity within patient's tolerance, Monitored during session, Repositioned, Patient requesting pain meds-RN notified     Hand Dominance Right   Extremity/Trunk Assessment Upper Extremity Assessment Upper Extremity Assessment: Generalized weakness;RUE deficits/detail;LUE deficits/detail (pt endorsed history of B shoulder arthritis) RUE Deficits / Details: shoulder flexion AROM <90 deg RUE: Shoulder pain with ROM LUE Deficits / Details: shoulder flexion AROM limited to ~90 deg   Lower Extremity Assessment Lower Extremity Assessment: Generalized weakness       Communication Communication Communication: No difficulties   Cognition Arousal/Alertness: Awake/alert Behavior During Therapy: WFL for tasks assessed/performed Overall Cognitive Status: History of cognitive impairments - at baseline         General Comments: Intermittent confusion at baseline. Oriented to self and month, followed single step commands with repetition and increased time, slow processing     General Comments       Exercises Other Exercises Other Exercises: OT provided education re: role of OT, OT POC, post acute recs, sitting up for all meals, EOB/OOB mobility with assistance, home/fall safety.     Shoulder Instructions      Home Living Family/patient expects to be discharged to:: Assisted living           Home Equipment: Rollator (4 wheels);Cane - single point;Shower Counsellor (2 wheels);Wheelchair - Careers adviser (comment) (lift chair)   Additional Comments: lives at ALF with his spouse. meals provided      Prior Functioning/Environment Prior Level of Function :  Needs assist             Mobility Comments: Mod I with rollator. limited ambulation (brother reports he will sometimes walk to the dining room to eat) ADLs Comments: geneally Mod I per patient report        OT Problem List: Decreased strength;Decreased activity tolerance;Impaired balance (sitting and/or standing);Decreased cognition;Decreased safety awareness;Decreased range of motion;Decreased knowledge of use of DME or AE;Pain      OT Treatment/Interventions: Self-care/ADL training;Therapeutic exercise;DME and/or AE instruction;Therapeutic activities;Cognitive remediation/compensation;Visual/perceptual remediation/compensation;Patient/family education;Balance training    OT Goals(Current goals can be found in the care plan section) Acute Rehab OT Goals Patient Stated Goal: return home OT Goal Formulation: With patient Time For Goal Achievement: 04/21/23 Potential to Achieve Goals: Fair   OT Frequency: Min 1X/week    Co-evaluation              AM-PAC OT "6 Clicks" Daily Activity     Outcome Measure Help from another person eating meals?: None Help from another person taking care of personal grooming?: A Little Help from another person toileting, which includes using toliet, bedpan, or urinal?: A Lot Help from another person bathing (including washing, rinsing, drying)?: A Lot Help from another person to put on and taking off regular upper body clothing?: A Little Help from another person to put on and taking off regular lower body clothing?: A Lot 6 Click Score: 16   End of Session Equipment Utilized During Treatment: Gait belt;Rolling walker (2 wheels) Nurse Communication: Mobility status;Patient requests pain meds  Activity Tolerance: Patient tolerated treatment well;Patient limited by fatigue Patient left: in  bed;with call bell/phone within reach;with bed alarm set  OT Visit Diagnosis: Unsteadiness on feet (R26.81);Muscle weakness (generalized) (M62.81);Pain Pain  - part of body:  (back)                Time: 6578-4696 OT Time Calculation (min): 22 min Charges:  OT General Charges $OT Visit: 1 Visit OT Evaluation $OT Eval Low Complexity: 1 Low  [facility] MS, OTR/L ascom [phone]  04/07/23, 1:48 PM

## 2023-04-08 ENCOUNTER — Inpatient Hospital Stay: Payer: No Typology Code available for payment source

## 2023-04-08 DIAGNOSIS — R7881 Bacteremia: Secondary | ICD-10-CM | POA: Diagnosis not present

## 2023-04-08 DIAGNOSIS — B955 Unspecified streptococcus as the cause of diseases classified elsewhere: Secondary | ICD-10-CM | POA: Diagnosis not present

## 2023-04-08 DIAGNOSIS — K7682 Hepatic encephalopathy: Secondary | ICD-10-CM | POA: Diagnosis not present

## 2023-04-08 DIAGNOSIS — R6520 Severe sepsis without septic shock: Secondary | ICD-10-CM | POA: Diagnosis not present

## 2023-04-08 DIAGNOSIS — A419 Sepsis, unspecified organism: Secondary | ICD-10-CM | POA: Diagnosis not present

## 2023-04-08 DIAGNOSIS — K7460 Unspecified cirrhosis of liver: Secondary | ICD-10-CM | POA: Diagnosis not present

## 2023-04-08 LAB — CULTURE, BLOOD (ROUTINE X 2)
Culture: NO GROWTH
Culture: NO GROWTH
Special Requests: ADEQUATE
Special Requests: ADEQUATE

## 2023-04-08 LAB — GLUCOSE, CAPILLARY
Glucose-Capillary: 109 mg/dL — ABNORMAL HIGH (ref 70–99)
Glucose-Capillary: 119 mg/dL — ABNORMAL HIGH (ref 70–99)
Glucose-Capillary: 152 mg/dL — ABNORMAL HIGH (ref 70–99)
Glucose-Capillary: 144 mg/dL — ABNORMAL HIGH (ref 70–99)

## 2023-04-08 MED ORDER — SODIUM CHLORIDE 0.9 % IV SOLN: INTRAVENOUS | Status: DC

## 2023-04-08 MED ORDER — DIAZEPAM 5 MG/ML IJ SOLN
2.5000 mg | Freq: Once | INTRAMUSCULAR | Status: AC
  Administered 2023-04-08: 2.5 mg via INTRAVENOUS
  Filled 2023-04-08: qty 2

## 2023-04-08 NOTE — Care Management Important Message (Signed)
Important Message  Patient Details  Name: Joshua Berry MRN: [id number] Date of Birth: 1946-10-26   Medicare Important Message Given:  N/A - LOS <3 / Initial given by admissions     [name] 04/08/2023, 8:19 AM

## 2023-04-08 NOTE — Consult Note (Signed)
   [facility] Inpatient Consult   04/08/2023  NEIL BRICKELL [birth year] [id number]     Location: [facility] Liaison met with pt via bedside [facility]).   [facility] Customer service manager [facility]) Accountable Care Organization [ACO] Patient: Insurance (1. VA 2.MCR)    [facility]   Patient screened for readmission hospitalization with noted medium risk score for unplanned readmission risk with 1 IP/1 ED in 6 months. [facility]/Population Health RN liaison will assess for potential [facility]) Care Management service needs for post hospital transition for care coordination. [facility] liaison visited pt at bedside and confirmed all his needs were being met through his VA benefits. No referral based upon this information provided.   [facility] Care Management/Population Health does not replace or interfere with any arrangements made by the Inpatient Transition of Care team.   For questions contact:      [name], RN, BSN [facility] Liaison [place]   [facility]  Population Health Office Hours MTWF 8:00 am to 6 pm off on Thursday [phone] mobile [phone] [Office toll free line][facility] Office Hours are M-F 8:30 - 5 pm 24 hour nurse advise line [phone] Conceirge  [email or web address]

## 2023-04-08 NOTE — Progress Notes (Signed)
Physical Therapy Treatment Patient Details Name: Joshua Berry MRN: [id number] DOB: 05-10-1946 Today's Date: 04/08/2023   History of Present Illness Patient is a 77 year old male with chief complaint of AMS, found to be septic with infection source unclear. History of Joshua Berry, liver cirrhosis, hypertension, iron deficiency anemia, chronic thrombocytopenia    PT Comments    Pt in bed, ready for session.  Did state nursing would not let him walk to the bathroom.  Discussed gait limitations and agreed that walking to the bathroom was not appropriate at this time but using a bedside commode would be.  Voiced understanding.  He is able to roll to left with mod a x 1 and transition to sitting with mod a x 1.  Limited by R shoulder pain.  Steady in sitting and is able to stand x 2 from EOB with mod a x 1.  Takes several sidesteps along bed, seated rest then transitions to chair at bedside.  Verbal cues for hand placements and sequencing.  Remained in chair with needs met after session.   Recommendations for follow up therapy are one component of a multi-disciplinary discharge planning process, led by the attending physician.  Recommendations may be updated based on patient status, additional functional criteria and insurance authorization.  Follow Up Recommendations       Assistance Recommended at Discharge Intermittent Supervision/Assistance  Patient can return home with the following A little help with walking and/or transfers;A little help with bathing/dressing/bathroom;Assistance with cooking/housework;Help with stairs or ramp for entrance;Assist for transportation   Equipment Recommendations  None recommended by PT    Recommendations for Other Services       Precautions / Restrictions Precautions Precautions: Fall Restrictions Weight Bearing Restrictions: No     Mobility  Bed Mobility Overal bed mobility: Needs Assistance Bed Mobility: Rolling, Sidelying to Sit Rolling: Min  assist Sidelying to sit: Min assist       General bed mobility comments: limited by R shoulder pain but does well overall    Transfers Overall transfer level: Needs assistance Equipment used: Rolling walker (2 wheels) Transfers: Sit to/from Stand Sit to Stand: Min assist, Mod assist, From elevated surface                Ambulation/Gait Ambulation/Gait assistance: Min assist Gait Distance (Feet): 3 Feet Assistive device: Rolling walker (2 wheels) Gait Pattern/deviations: Step-to pattern Gait velocity: decreased     General Gait Details: sidesteps along bed then after seated rest on bed is able to transition to chair at bedside.  slow steps with heavy reliance of RW.  limited gait distances.   Stairs             Wheelchair Mobility    Modified Rankin (Stroke Patients Only)       Balance Overall balance assessment: Needs assistance Sitting-balance support: Feet supported Sitting balance-Leahy Scale: Fair     Standing balance support: Reliant on assistive device for balance, Bilateral upper extremity supported, During functional activity Standing balance-Leahy Scale: Fair Standing balance comment: using rolling walker for support in standing                            Cognition Arousal/Alertness: Awake/alert Behavior During Therapy: WFL for tasks assessed/performed Overall Cognitive Status: History of cognitive impairments - at baseline  Exercises      General Comments        Pertinent Vitals/Pain Pain Assessment Pain Assessment: Faces Faces Pain Scale: Hurts little more Pain Location: R shoulder and toes - ? toenails Pain Descriptors / Indicators: Grimacing, Aching, Sore Pain Intervention(s): Limited activity within patient's tolerance, Monitored during session, Repositioned    Home Living                          Prior Function            PT Goals  (current goals can now be found in the care plan section) Progress towards PT goals: Progressing toward goals    Frequency    Min 3X/week      PT Plan Current plan remains appropriate    Co-evaluation              AM-PAC PT "6 Clicks" Mobility   Outcome Measure  Help needed turning from your back to your side while in a flat bed without using bedrails?: A Lot Help needed moving from lying on your back to sitting on the side of a flat bed without using bedrails?: A Lot Help needed moving to and from a bed to a chair (including a wheelchair)?: A Lot Help needed standing up from a chair using your arms (e.g., wheelchair or bedside chair)?: A Lot Help needed to walk in hospital room?: A Lot Help needed climbing 3-5 steps with a railing? : Total 6 Click Score: 11    End of Session Equipment Utilized During Treatment: Gait belt Activity Tolerance: Patient tolerated treatment well;Patient limited by fatigue Patient left: in chair;with call bell/phone within reach;with chair alarm set Nurse Communication: Mobility status PT Visit Diagnosis: Unsteadiness on feet (R26.81);Muscle weakness (generalized) (M62.81)     Time: 8295-6213 PT Time Calculation (min) (ACUTE ONLY): 20 min  Charges:  $Therapeutic Activity: 8-22 mins                   Danielle Dess, PTA 04/08/23, 3:16 PM

## 2023-04-08 NOTE — Progress Notes (Signed)
   [facility] has been requested to perform a transesophageal echocardiogram on Joshua Berry for bacteremia.  After careful review of history and examination, the risks and benefits of transesophageal echocardiogram have been explained including risks of esophageal damage, perforation (1:10,000 risk), bleeding, pharyngeal hematoma as well as other potential complications associated with conscious sedation including aspiration, arrhythmia, respiratory failure and death. Alternatives to treatment were discussed, questions were answered. Patient is willing to proceed. We will tentatively schedule for 04/08/2023. Time TBD.  Nicolasa Ducking, NP  04/08/2023 10:22 AM

## 2023-04-08 NOTE — Progress Notes (Signed)
Date of Admission:  04/05/2023      ID: Joshua Berry is a 77 y.o. male Principal Problem:   Streptococcal bacteremia Active Problems:   Severe sepsis   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   Secondary esophageal varices without bleeding   Portal hypertension   HLD (hyperlipidemia)   HTN (hypertension)   Chronic diastolic CHF (congestive heart failure)   Pancytopenia   Hypothyroidism   Physical deconditioning   Lactic acidosis   Hypomagnesemia   Acute hepatic encephalopathy   Overweight (BMI 25.0-29.9)   Hepatic encephalopathy   Altered mental status    Subjective: Pt says he is feeling okay   Medications:   acidophilus  1 capsule Oral Daily   ascorbic acid  1,000 mg Oral Daily   atorvastatin  20 mg Oral QHS   diazepam  2.5 mg Intravenous Once   famotidine  20 mg Oral Daily   folic acid  1 mg Oral Daily   insulin aspart  0-9 Units Subcutaneous TID WC   insulin aspart  6 Units Subcutaneous TID WC   insulin glargine-yfgn  10 Units Subcutaneous QHS   lactulose  30 g Oral Once   lactulose  30 g Oral BID   levothyroxine  100 mcg Oral Q0600   magnesium oxide  800 mg Oral BID   melatonin  10 mg Oral QHS   mineral oil-hydrophilic petrolatum   Topical BID   pantoprazole  40 mg Oral BID   rifaximin  550 mg Oral BID    Objective: Vital signs in last 24 hours: Patient Vitals for the past 24 hrs:  BP Temp Temp src Pulse Resp SpO2  04/08/23 0745 133/68 98.4 F (36.9 C) Oral 60 18 100 %  04/08/23 0428 138/70 98.5 F (36.9 C) Oral 62 18 98 %  04/07/23 2008 (!) 109/58 98.6 F (37 C) Oral (!) 59 19 99 %      PHYSICAL EXAM:  General: Alert, cooperative, no distress,  Head: small excoriations- covered with bandaid Eyes: Conjunctivae clear, anicteric sclerae. Pupils are equal ENT Nares normal. No drainage or sinus tenderness. Lips, mucosa, and tongue normal. No Thrush Neck: Supple, symmetrical, no adenopathy, thyroid: non  tender no carotid bruit and no JVD.  Lungs: Clear to auscultation bilaterally. No Wheezing or Rhonchi. No rales. Heart: Regular rate and rhythm, no murmur, rub or gallop. Abdomen: Soft, non-tender,not distended. Bowel sounds normal. No masses Extremities: venpus pigmentation and excoriations     Skin: No rashes or lesions. Or bruising Lymph: Cervical, supraclavicular normal. Neurologic: Grossly non-focal  Lab Results    Latest Ref Rng & Units 04/07/2023    5:30 AM 04/06/2023    5:18 AM 04/05/2023    4:42 PM  CBC  WBC 4.0 - 10.5 K/uL 5.1  5.5  3.8   Hemoglobin 13.0 - 17.0 g/dL 16.1  09.6  04.5   Hematocrit 39.0 - 52.0 % 34.4  35.4  37.4   Platelets 150 - 400 K/uL 32  31  37        Latest Ref Rng & Units 04/07/2023    5:30 AM 04/06/2023    5:18 AM 04/05/2023    4:42 PM  CMP  Glucose 70 - 99 mg/dL 409  811  914   BUN 8 - 23 mg/dL 16  17  14    Creatinine 0.61 - 1.24 mg/dL 7.82  9.56  2.13   Sodium 135 - 145 mmol/L 138  134  137   Potassium  3.5 - 5.1 mmol/L 3.7  3.8  3.8   Chloride 98 - 111 mmol/L 109  106  107   CO2 22 - 32 mmol/L Calcium 8.9 - 10.3 mg/dL 8.4  8.6  9.1   Total Protein 6.5 - 8.1 g/dL 5.7   6.6   Total Bilirubin 0.3 - 1.2 mg/dL 2.5   2.8   Alkaline Phos 38 - 126 U/L 71   95   AST 15 - 41 U/L 71   53   ALT 0 - 44 U/L 29   33       Microbiology:  Studies/Results: MR THORACIC SPINE WO CONTRAST  Result Date: 04/07/2023 CLINICAL DATA:  Mid back pain. Infection suspected. Abnormal x-ray/CT. Cirrhosis. Altered mental status. EXAM: MRI THORACIC SPINE WITHOUT CONTRAST TECHNIQUE: Multiplanar, multisequence MR imaging of the thoracic spine was performed. No intravenous contrast was administered. COMPARISON:  Chest CT 2 days ago. Lumbar MRI 05/17/2022. Chest CT 04/06/2022 chest CT 05/14/2022. FINDINGS: Alignment:  No significant malalignment. Vertebrae: Old inferior endplate fracture at T5 and superior endplate fracture at T6. Some bone marrow edema in this  region that is likely related to subsequent degenerative change. No T2 bright material in the disc space. Old compression fracture at T12 with more recent worsening in the inferior T12 vertebral body portion, also with development of a Schmorl's node at the superior endplate of L1. Similarly, this is favored to be extension of previous fracture more likely than worsening changes related to spinal infection. See below. Cord:  No primary cord lesion.  See below regarding stenosis. Paraspinal and other soft tissues: No paravertebral inflammatory changes are appreciated by MRI. Disc levels: T1-2: Minor disc bulge.  Mild facet degeneration.  No stenosis. T2-3: Moderate disc bulge and facet degeneration. No compressive stenosis. T3-4: Normal interspace. T4-5: Minimal disc bulge.  No stenosis. T5-6: Chronic disc herniation with calcification. Narrowing of the canal with slight deformity of the cord. Some subarachnoid space does surround the cord. 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 T6-7: Chronic shallow disc protrusion with partial calcification. Narrowing of the ventral subarachnoid space but no compression of the cord. T7-8: Chronic shallow protrusion more prominent towards the left with partial calcification. Narrowing of the ventral subarachnoid space but no compression of the cord. T8-9: Small central disc protrusion but no neural compression. T9-10: Small central to left posterolateral disc protrusion but no neural compression. T10-11: Endplate osteophytes and shallow protrusion of the disc. Facet and ligamentous hypertrophy. Canal narrowing with narrowing of the subarachnoid space surrounding the cord. T11-12: Disc bulge.  No stenosis. T12-L1: Disc bulge.  No stenosis. IMPRESSION: 1. Old inferior endplate fracture at T5 and superior endplate fracture at T6. Some bone marrow edema in this region is likely related to subsequent degenerative change. No T2 bright material in the disc space to suggest the  presence of infection. If there are strong clinical indications of infection, 1 could consider a follow-up scan in several weeks. I do not think it is possible to completely exclude infection at this point in time. 2. Old compression fracture at T12 with more recent worsening in the inferior T12 vertebral body portion, also with development of a Schmorl's node at the superior endplate of L1. Similarly, this is favored to be extension of previous fracture more likely than worsening changes related to spinal infection. As above, if concern persists this could be re-evaluated in several weeks. I do not think it is possible to completely exclude  infection at this point in time. 3. 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 Narrowing of the ventral subarachnoid space but no compression of the cord. Electronically Signed   By: Paulina Fusi M.D.   On: 04/07/2023 14:05     Assessment/Plan: Streptococcus anginosus bacteremia.  High bioburden.  This is a GIT organism. He has had aortic valve replacement so need to r/o endocarditis. Thoracic spine MRI did not show any osteomyelitis or discitis Right shoulder pain and restricted movement.  Very likely osteoarthritis but because of bacteremia will rule out infection.will get MRI  Depending on the TEE results and the right shoulder MRI we can decide on duration of antibiotic anywhere from 2 to 6 weeks. Repeat blood culture has been sent He has thrombocytopenia and hence TEE may be risky. If  TEE is not  performed then he will have to be empirically treated with prolonged course.  Patient is currently on IV ceftriaxone.   Cirrhosis  liver with decompensation.  Had ascites and underwent TIPS Recurrent hepatic encephalopathy.  History of left shoulder replacement  History of recurrent bacteremia Group B streptococcus in the past treated Staph capri treated  last year  Discussed the management with his daughter lisa Discussed the management  with hospitlaist  [facility] physicians will provide coverage while I am away from tomorrow

## 2023-04-08 NOTE — Progress Notes (Addendum)
PROGRESS NOTE    Joshua Berry   ZOX:[id number] DOB: 1946/01/30  DOA: 04/05/2023 Date of Service: 04/08/23 PCP: [facility]     Brief Narrative / Hospital Course:  Joshua Berry is a 77 year old male with history of NASH liver cirrhosis, hypertension, iron deficiency anemia, chronic thrombocytopenia, who presents from [facility] via EMS on 04/05/23 to emergency department for chief concerns of altered mental status. According to his wife he became progressively altered throughout the day and afternoon yesterday. No recent illnesses, trauma, other acute medical complaints. Last night given his altered mental status did not take his lactulose.  04/20: disoriented to situation but oriented to self. (+)fever, tachypnea, low WBC 3.8 = SIRS/sepsis. Ammonia elevated to 71. Lactic 2.1 --> 1.7. COVID/flu/RSV neg. UA neg for UTI. CXR non-acute. CT head non-acute, (+)chronic microvascular ischemic change and cerebral volume loss. CT C/A/P: liver disease, patent TIPS shunt, severe T12 compression question subacute, no noted infection. Admitted for concern for sepsis / hepatic encephalopathy  04/21: MRI brain non-acute. Ammonia down to 33. BCx (+)strep, change abx to ceftriaxone, Echo ordered, will repeat BCx and ask ID to see tomorrow. PT/OT ordered.  04/22: stable. Repeat BCx. MRI to eval for osteo Tspine given compression fx --> no osteo noted. TTE no concerns. ID saw patient - will need 4 weeks IV abx.  04/23: Cardiology consulted for TEE.     Consultants:  Infectious Disease Cardiology   Procedures: none      ASSESSMENT & PLAN:   Principal Problem:   Severe sepsis Active Problems:   Acute hepatic encephalopathy   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   Chronic diastolic CHF (congestive heart failure)   Hypothyroidism   Overweight (BMI 25.0-29.9)   Secondary esophageal varices without bleeding   HLD (hyperlipidemia)   HTN  (hypertension)   Physical deconditioning   Hypomagnesemia   Hepatic encephalopathy    Sepsis w/ positive BCx x4 strep, source likely dental infection  Blood cultures (+)strep  Cefepime and vancomycin per pharmacy --> ceftriaxone yesterday, today is Day 4 total abx, will need 4 weeks following culture results repeat BCx pending  ID following  TEE pending   Compression fracture T-spine MRI showed no infectious process in spine Pain control  R shoulder pain MRI pending to evaluate septic arthritis   Altered mental status, improving  Likely hepatic encephalopathy + sepsis  MRI of the brain without contrast no concerns  Monitor     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 twice daily lactulose 30 g p.o. twice daily   Thrombocytopenia D/t liver disease Has been unable to get dental extractions d/t this per daughter  Monitor Plt  If needing surgical procedures may have to get plt transfusion   Hypothyroidism Levothyroxine 100 mcg daily    Hypomagnesemia Magnesium sulfate 1 g IV one-time dose ordered follow magnesium level    HTN (hypertension) Atorvastatin 20 mg nightly   IDDM Type 2 SSI ac Basal insulin Adjust as needed based on Glc      DVT prophylaxis: TED hose, no Rx ppx d/t chronic thrombocytopenia  Pertinent IV fluids/nutrition: no continuous IV fluids; regular diet  Central lines / invasive devices: none  Code Status: DNR - DNR, admitting hospitalist Dr Sedalia Muta noted she confirmed this with daughter at bedside 04/20. Daughter states patient has a MOST form  ACP documentation reviewed: 04/06/23, 8:01 AM: none on file   Current Admission Status: inpatient  TOC needs / Dispo plan:  HH, anticipate d/c back to ALF Barriers to discharge / significant pending items: blood culture repeat, TEE              Subjective / Brief ROS:  Patient reports no concerns Denies CP/SOB, pain    Family  Communication: called and spoke w/ daughter 04/08/23 3:06 PM     Objective Findings:  Vitals:   04/07/23 0717 04/07/23 2008 04/08/23 0428 04/08/23 0745  BP: 130/84 (!) 109/58 138/70 133/68  Pulse: 67 (!) 59 62 60  Resp: 17 19 18 18   Temp: 98.5 F (36.9 C) 98.6 F (37 C) 98.5 F (36.9 C) 98.4 F (36.9 C)  TempSrc: Oral Oral Oral Oral  SpO2: 98% 99% 98% 100%  Weight:      Height:        Intake/Output Summary (Last 24 hours) at 04/08/2023 1506 Last data filed at 04/08/2023 1130 Gross per 24 hour  Intake 220 ml  Output 1500 ml  Net -1280 ml   Filed Weights   04/05/23 1628  Weight: 104.4 kg    Examination:  Physical Exam Constitutional:      General: He is not in acute distress. HENT:     Mouth/Throat:     Comments: Poor dentition, no obvious abscess Cardiovascular:     Rate and Rhythm: Normal rate and regular rhythm.  Pulmonary:     Effort: Pulmonary effort is normal.     Breath sounds: Normal breath sounds.  Abdominal:     General: Abdomen is flat.     Palpations: Abdomen is soft.  Musculoskeletal:     Right lower leg: No edema.     Left lower leg: No edema.  Skin:    General: Skin is warm.  Neurological:     General: No focal deficit present.     Mental Status: He is alert. Mental status is at baseline.  Psychiatric:        Mood and Affect: Mood normal.        Behavior: Behavior normal.          Scheduled Medications:   acidophilus  1 capsule Oral Daily   ascorbic acid  1,000 mg Oral Daily   atorvastatin  20 mg Oral QHS   diazepam  2.5 mg Intravenous Once   famotidine  20 mg Oral Daily   folic acid  1 mg Oral Daily   insulin aspart  0-9 Units Subcutaneous TID WC   insulin aspart  6 Units Subcutaneous TID WC   insulin glargine-yfgn  10 Units Subcutaneous QHS   lactulose  30 g Oral Once   lactulose  30 g Oral BID   levothyroxine  100 mcg Oral Q0600   magnesium oxide  800 mg Oral BID   melatonin  10 mg Oral QHS   mineral oil-hydrophilic  petrolatum   Topical BID   pantoprazole  40 mg Oral BID   rifaximin  550 mg Oral BID    Continuous Infusions:  cefTRIAXone (ROCEPHIN)  IV 2 g (04/07/23 1410)    PRN Medications:  acetaminophen **OR** acetaminophen, ondansetron **OR** ondansetron (ZOFRAN) IV, oxyCODONE, senna-docusate, trolamine salicylate  Antimicrobials from admission:  Anti-infectives (From admission, onward)    Start     Dose/Rate Route Frequency Ordered Stop   04/06/23 2200  vancomycin (VANCOREADY) IVPB 2000 mg/400 mL  Status:  Discontinued        2,000 mg 200 mL/hr over 120 Minutes Intravenous Every 24 hours 04/05/23 2054 04/06/23 1224   04/06/23 1400  cefTRIAXone (  ROCEPHIN) 2 g in sodium chloride 0.9 % 100 mL IVPB        2 g 200 mL/hr over 30 Minutes Intravenous Every 24 hours 04/06/23 1224     04/05/23 2200  rifaximin (XIFAXAN) tablet 550 mg       Note to Pharmacy: Takes with lactulose     550 mg Oral 2 times daily 04/05/23 1800     04/05/23 2200  vancomycin (VANCOCIN) IVPB 1000 mg/200 mL premix       See Hyperspace for full Linked Orders Report.   1,000 mg 200 mL/hr over 60 Minutes Intravenous  Once 04/05/23 2054 04/06/23 0104   04/05/23 2100  vancomycin (VANCOREADY) IVPB 1500 mg/300 mL       See Hyperspace for full Linked Orders Report.   1,500 mg 150 mL/hr over 120 Minutes Intravenous  Once 04/05/23 2054 04/06/23 0456   04/05/23 2100  ceFEPIme (MAXIPIME) 2 g in sodium chloride 0.9 % 100 mL IVPB  Status:  Discontinued        2 g 200 mL/hr over 30 Minutes Intravenous Every 8 hours 04/05/23 2054 04/06/23 1224           Data Reviewed:  I have personally reviewed the following...  CBC: Recent Labs  Lab 04/05/23 1642 04/06/23 0518 04/07/23 0530  WBC 3.8* 5.5 5.1  NEUTROABS 2.9  --   --   HGB 13.1 12.4* 11.9*  HCT 37.4* 35.4* 34.4*  MCV 93.0 93.2 93.5  PLT 37* 31* 32*   Basic Metabolic Panel: Recent Labs  Lab 04/05/23 1642 04/06/23 0518 04/07/23 0530  NA 137 134* 138  K 3.8 3.8  3.7  CL 107 106 109  CO2 22 20* 23  GLUCOSE 122* 145* 125*  BUN 14 17 16   CREATININE 1.14 1.06 0.99  CALCIUM 9.1 8.6* 8.4*  MG 1.6* 1.8  --    GFR: Estimated Creatinine Clearance: 79.2 mL/min (by C-G formula based on SCr of 0.99 mg/dL). Liver Function Tests: Recent Labs  Lab 04/05/23 1642 04/07/23 0530  AST 53* 71*  ALT 33 29  ALKPHOS 95 71  BILITOT 2.8* 2.5*  PROT 6.6 5.7*  ALBUMIN 3.2* 2.7*   No results for input(s): "LIPASE", "AMYLASE" in the last 168 hours. Recent Labs  Lab 04/05/23 1642 04/06/23 0947  AMMONIA 71* 33   Coagulation Profile: No results for input(s): "INR", "PROTIME" in the last 168 hours. Cardiac Enzymes: Recent Labs  Lab 04/05/23 1642  CKTOTAL 114   BNP (last 3 results) No results for input(s): "PROBNP" in the last 8760 hours. HbA1C: No results for input(s): "HGBA1C" in the last 72 hours. CBG: Recent Labs  Lab 04/07/23 1225 04/07/23 1644 04/07/23 2008 04/08/23 0822 04/08/23 1305  GLUCAP 134* 147* 148* 109* 119*   Lipid Profile: No results for input(s): "CHOL", "HDL", "LDLCALC", "TRIG", "CHOLHDL", "LDLDIRECT" in the last 72 hours. Thyroid Function Tests: No results for input(s): "TSH", "T4TOTAL", "FREET4", "T3FREE", "THYROIDAB" in the last 72 hours. Anemia Panel: No results for input(s): "VITAMINB12", "FOLATE", "FERRITIN", "TIBC", "IRON", "RETICCTPCT" in the last 72 hours. Most Recent Urinalysis On File:     Component Value Date/Time   COLORURINE YELLOW (A) 04/06/2023 0217   APPEARANCEUR CLEAR (A) 04/06/2023 0217   APPEARANCEUR Hazy 05/01/2014 1653   LABSPEC >1.046 (H) 04/06/2023 0217   LABSPEC 1.015 05/01/2014 1653   PHURINE 5.0 04/06/2023 0217   GLUCOSEU NEGATIVE 04/06/2023 0217   GLUCOSEU Negative 05/01/2014 1653   HGBUR SMALL (A) 04/06/2023 0217   BILIRUBINUR NEGATIVE  04/06/2023 0217   BILIRUBINUR Negative 05/01/2014 1653   KETONESUR NEGATIVE 04/06/2023 0217   PROTEINUR 30 (A) 04/06/2023 0217   NITRITE NEGATIVE  04/06/2023 0217   LEUKOCYTESUR NEGATIVE 04/06/2023 0217   LEUKOCYTESUR Negative 05/01/2014 1653   Sepsis Labs: (procalcitonin:4,lacticidven:4) Microbiology: Recent Results (from the past 240 hour(s))  Resp panel by RT-PCR (RSV, Flu A&B, Covid)     Status: None   Collection Time: 04/05/23  9:24 PM   Specimen: Nasal Swab  Result Value Ref Range Status   SARS Coronavirus 2 by RT PCR NEGATIVE NEGATIVE Final    Comment: (NOTE) SARS-CoV-2 target nucleic acids are NOT DETECTED.  The SARS-CoV-2 RNA is generally detectable in upper respiratory specimens during the acute phase of infection. The lowest concentration of SARS-CoV-2 viral copies this assay can detect is 138 copies/mL. A negative result does not preclude SARS-Cov-2 infection and should not be used as the sole basis for treatment or other patient management decisions. A negative result may occur with  improper specimen collection/handling, submission of specimen other than nasopharyngeal swab, presence of viral mutation(s) within the areas targeted by this assay, and inadequate number of viral copies(<138 copies/mL). A negative result must be combined with clinical observations, patient history, and epidemiological information. The expected result is Negative.  Fact Sheet for Patients:  BloggerCourse.com  Fact Sheet for Healthcare Providers:  SeriousBroker.it  This test is no t yet approved or cleared by the [place] FDA and  has been authorized for detection and/or diagnosis of SARS-CoV-2 by FDA under an Emergency Use Authorization (EUA). This EUA will remain  in effect (meaning this test can be used) for the duration of the COVID-19 declaration under Section 564(b)(1) of the Act, 21 U.S.C.section 360bbb-3(b)(1), unless the authorization is terminated  or revoked sooner.       Influenza A by PCR NEGATIVE NEGATIVE Final   Influenza B by PCR NEGATIVE  NEGATIVE Final    Comment: (NOTE) The Xpert Xpress SARS-CoV-2/FLU/RSV plus assay is intended as an aid in the diagnosis of influenza from Nasopharyngeal swab specimens and should not be used as a sole basis for treatment. Nasal washings and aspirates are unacceptable for Xpert Xpress SARS-CoV-2/FLU/RSV testing.  Fact Sheet for Patients: BloggerCourse.com  Fact Sheet for Healthcare Providers: SeriousBroker.it  This test is not yet approved or cleared by the [place] FDA and has been authorized for detection and/or diagnosis of SARS-CoV-2 by FDA under an Emergency Use Authorization (EUA). This EUA will remain in effect (meaning this test can be used) for the duration of the COVID-19 declaration under Section 564(b)(1) of the Act, 21 U.S.C. section 360bbb-3(b)(1), unless the authorization is terminated or revoked.     Resp Syncytial Virus by PCR NEGATIVE NEGATIVE Final    Comment: (NOTE) Fact Sheet for Patients: BloggerCourse.com  Fact Sheet for Healthcare Providers: SeriousBroker.it  This test is not yet approved or cleared by the [place] FDA and has been authorized for detection and/or diagnosis of SARS-CoV-2 by FDA under an Emergency Use Authorization (EUA). This EUA will remain in effect (meaning this test can be used) for the duration of the COVID-19 declaration under Section 564(b)(1) of the Act, 21 U.S.C. section 360bbb-3(b)(1), unless the authorization is terminated or revoked.  Performed at [facility], [address] [place], [place] [address]   Culture, blood (Routine X 2) w Reflex to ID Panel     Status: Abnormal   Collection Time: 04/05/23  9:24 PM   Specimen: BLOOD RIGHT ARM  Result Value Ref Range Status   Specimen Description   Final    BLOOD RIGHT ARM Performed at [facility], [address] [place], [place] [address]     Special Requests   Final    BOTTLES DRAWN AEROBIC AND ANAEROBIC Blood Culture adequate volume Performed at [facility], [address] [place], [place] [address]    Culture  Setup Time   Final    GRAM POSITIVE COCCI IN BOTH AEROBIC AND ANAEROBIC BOTTLES CRITICAL RESULT CALLED TO, READ BACK BY AND VERIFIED WITH: CAROLYN COULTER AT 1211 04/06/23.PMF  REVIEWED BY A. LAFRANCE    Culture (A)  Final    STREPTOCOCCUS ANGINOSIS SUSCEPTIBILITIES PERFORMED ON PREVIOUS CULTURE WITHIN THE LAST 5 DAYS. Performed at [facility], [address], [place], [place] [address]    Report Status 04/08/2023 FINAL  Final  Culture, blood (Routine X 2) w Reflex to ID Panel     Status: Abnormal   Collection Time: 04/05/23  9:24 PM   Specimen: BLOOD LEFT ARM  Result Value Ref Range Status   Specimen Description BLOOD LEFT ARM  Final   Special Requests   Final    BOTTLES DRAWN AEROBIC AND ANAEROBIC Blood Culture results may not be optimal due to an inadequate volume of blood received in culture bottles   Culture  Setup Time   Final    GRAM POSITIVE COCCI IN BOTH AEROBIC AND ANAEROBIC BOTTLES CRITICAL RESULT CALLED TO, READ BACK BY AND VERIFIED WITH: CAROLYN COULTER AT 1211 04/06/23.PMF  REVIEWED BY A. LAFRANCE    Culture STREPTOCOCCUS ANGINOSIS (A)  Final   Report Status 04/08/2023 FINAL  Final   Organism ID, Bacteria STREPTOCOCCUS ANGINOSIS  Final      Susceptibility   Streptococcus anginosis - MIC*    PENICILLIN <=0.06 SENSITIVE Sensitive     CEFTRIAXONE <=0.12 SENSITIVE Sensitive     ERYTHROMYCIN 1 RESISTANT Resistant     LEVOFLOXACIN 0.5 SENSITIVE Sensitive     VANCOMYCIN 0.5 SENSITIVE Sensitive     * STREPTOCOCCUS ANGINOSIS  Blood Culture ID Panel (Reflexed)     Status: Abnormal   Collection Time: 04/05/23  9:24 PM  Result Value Ref Range Status   Enterococcus faecalis NOT DETECTED NOT DETECTED Final   Enterococcus Faecium NOT DETECTED NOT DETECTED Final   Listeria monocytogenes  NOT DETECTED NOT DETECTED Final   Staphylococcus species NOT DETECTED NOT DETECTED Final   Staphylococcus aureus (BCID) NOT DETECTED NOT DETECTED Final   Staphylococcus epidermidis NOT DETECTED NOT DETECTED Final   Staphylococcus lugdunensis NOT DETECTED NOT DETECTED Final   Streptococcus species DETECTED (A) NOT DETECTED Final    Comment: Not Enterococcus species, Streptococcus agalactiae, Streptococcus pyogenes, or Streptococcus pneumoniae. CRITICAL RESULT CALLED TO, READ BACK BY AND VERIFIED WITH: CAROLYN COULTER AT 1211 04/06/23.PMF    Streptococcus agalactiae NOT DETECTED NOT DETECTED Final   Streptococcus pneumoniae NOT DETECTED NOT DETECTED Final   Streptococcus pyogenes NOT DETECTED NOT DETECTED Final   A.calcoaceticus-baumannii NOT DETECTED NOT DETECTED Final   Bacteroides fragilis NOT DETECTED NOT DETECTED Final   Enterobacterales NOT DETECTED NOT DETECTED Final   Enterobacter cloacae complex NOT DETECTED NOT DETECTED Final   Escherichia coli NOT DETECTED NOT DETECTED Final   Klebsiella aerogenes NOT DETECTED NOT DETECTED Final   Klebsiella oxytoca NOT DETECTED NOT DETECTED Final   Klebsiella pneumoniae NOT DETECTED NOT DETECTED Final   Proteus species NOT DETECTED NOT DETECTED Final   Salmonella species NOT DETECTED NOT DETECTED Final  Serratia marcescens NOT DETECTED NOT DETECTED Final   Haemophilus influenzae NOT DETECTED NOT DETECTED Final   Neisseria meningitidis NOT DETECTED NOT DETECTED Final   Pseudomonas aeruginosa NOT DETECTED NOT DETECTED Final   Stenotrophomonas maltophilia NOT DETECTED NOT DETECTED Final   Candida albicans NOT DETECTED NOT DETECTED Final   Candida auris NOT DETECTED NOT DETECTED Final   Candida glabrata NOT DETECTED NOT DETECTED Final   Candida krusei NOT DETECTED NOT DETECTED Final   Candida parapsilosis NOT DETECTED NOT DETECTED Final   Candida tropicalis NOT DETECTED NOT DETECTED Final   Cryptococcus neoformans/gattii NOT DETECTED NOT  DETECTED Final    Comment: Performed at [facility], [address] [place], [place] [address]  Culture, blood (Routine X 2) w Reflex to ID Panel     Status: None (Preliminary result)   Collection Time: 04/07/23 12:41 PM   Specimen: BLOOD  Result Value Ref Range Status   Specimen Description BLOOD LEFT ANTECUBITAL  Final   Special Requests   Final    BOTTLES DRAWN AEROBIC AND ANAEROBIC Blood Culture adequate volume   Culture   Final    NO GROWTH < 24 HOURS Performed at [facility], [address] [place], [place] [address]    Report Status PENDING  Incomplete  Culture, blood (Routine X 2) w Reflex to ID Panel     Status: None (Preliminary result)   Collection Time: 04/07/23 12:41 PM   Specimen: BLOOD  Result Value Ref Range Status   Specimen Description BLOOD RIGHT ANTECUBITAL  Final   Special Requests   Final    BOTTLES DRAWN AEROBIC AND ANAEROBIC Blood Culture adequate volume   Culture   Final    NO GROWTH < 24 HOURS Performed at [facility], [address], [place], [place] [address]    Report Status PENDING  Incomplete      Radiology Studies last 3 days: MR THORACIC SPINE WO CONTRAST  Result Date: 04/07/2023 CLINICAL DATA:  Mid back pain. Infection suspected. Abnormal x-ray/CT. Cirrhosis. Altered mental status. EXAM: MRI THORACIC SPINE WITHOUT CONTRAST TECHNIQUE: Multiplanar, multisequence MR imaging of the thoracic spine was performed. No intravenous contrast was administered. COMPARISON:  Chest CT 2 days ago. Lumbar MRI 05/17/2022. Chest CT 04/06/2022 chest CT 05/14/2022. FINDINGS: Alignment:  No significant malalignment. Vertebrae: Old inferior endplate fracture at T5 and superior endplate fracture at T6. Some bone marrow edema in this region that is likely related to subsequent degenerative change. No T2 bright material in the disc space. Old compression fracture at T12 with more recent worsening in the inferior T12 vertebral body portion,  also with development of a Schmorl's node at the superior endplate of L1. Similarly, this is favored to be extension of previous fracture more likely than worsening changes related to spinal infection. See below. Cord:  No primary cord lesion.  See below regarding stenosis. Paraspinal and other soft tissues: No paravertebral inflammatory changes are appreciated by MRI. Disc levels: T1-2: Minor disc bulge.  Mild facet degeneration.  No stenosis. T2-3: Moderate disc bulge and facet degeneration. No compressive stenosis. T3-4: Normal interspace. T4-5: Minimal disc bulge.  No stenosis. T5-6: Chronic disc herniation with calcification. Narrowing of the canal with slight deformity of the cord. Some subarachnoid space does surround the cord. 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 T6-7: Chronic shallow disc protrusion with partial calcification. Narrowing of the ventral subarachnoid space but no compression of the cord. T7-8: Chronic shallow protrusion more prominent towards the left with partial  calcification. Narrowing of the ventral subarachnoid space but no compression of the cord. T8-9: Small central disc protrusion but no neural compression. T9-10: Small central to left posterolateral disc protrusion but no neural compression. T10-11: Endplate osteophytes and shallow protrusion of the disc. Facet and ligamentous hypertrophy. Canal narrowing with narrowing of the subarachnoid space surrounding the cord. T11-12: Disc bulge.  No stenosis. T12-L1: Disc bulge.  No stenosis. IMPRESSION: 1. Old inferior endplate fracture at T5 and superior endplate fracture at T6. Some bone marrow edema in this region is likely related to subsequent degenerative change. No T2 bright material in the disc space to suggest the presence of infection. If there are strong clinical indications of infection, 1 could consider a follow-up scan in several weeks. I do not think it is possible to completely exclude infection at this point in time.  2. Old compression fracture at T12 with more recent worsening in the inferior T12 vertebral body portion, also with development of a Schmorl's node at the superior endplate of L1. Similarly, this is favored to be extension of previous fracture more likely than worsening changes related to spinal infection. As above, if concern persists this could be re-evaluated in several weeks. I do not think it is possible to completely exclude infection at this point in time. 3. 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 Narrowing of the ventral subarachnoid space but no compression of the cord. Electronically Signed   By: Paulina Fusi M.D.   On: 04/07/2023 14:05   ECHOCARDIOGRAM COMPLETE  Result Date: 04/06/2023    ECHOCARDIOGRAM REPORT   Patient Name:   GOTHAM RADEN Hagan Date of Exam: 04/06/2023 Medical Rec #:  [id number]     Height:       74.0 in Accession #:    [id number]    Weight:       230.2 lb Date of Birth:  12-01-1946     BSA:          2.307 m Patient Age:    77 years      BP:           119/54 mmHg Patient Gender: M             HR:           67 bpm. Exam Location:  [facility] Procedure: 2D Echo Indications:     Bacteremia R78.81  History:         Patient has prior history of Echocardiogram examinations, most                  recent 05/17/2022.                  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  Sonographer:     Overton Mam RDCS, FASE Referring Phys:  [id number] Sunnie Nielsen Diagnosing Phys: Debbe Odea MD  Sonographer Comments: Image acquisition challenging due to respiratory motion and Image acquisition challenging due to patient body habitus. IMPRESSIONS  1. Left ventricular ejection fraction, by estimation, is 55 to 60%. Left ventricular ejection fraction by PLAX is 59 %. The left ventricle has normal function. The left ventricle has no regional wall motion abnormalities. There is mild left ventricular hypertrophy. Left ventricular diastolic  parameters are consistent with Grade I diastolic dysfunction (impaired relaxation).  2. Right ventricular systolic function is normal. The right ventricular size  is normal.  3. The mitral valve is degenerative. No evidence of mitral valve regurgitation.  4. The aortic valve has been repaired/replaced. Aortic valve regurgitation is mild. 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 Aortic valve mean gradient measures 11.8 mmHg. FINDINGS  Left Ventricle: Left ventricular ejection fraction, by estimation, is 55 to 60%. Left ventricular ejection fraction by PLAX is 59 %. The left ventricle has normal function. The left ventricle has no regional wall motion abnormalities. The left ventricular internal cavity size was normal in size. There is mild left ventricular hypertrophy. Left ventricular diastolic parameters are consistent with Grade I diastolic dysfunction (impaired relaxation). Right Ventricle: The right ventricular size is normal. No increase in right ventricular wall thickness. Right ventricular systolic function is normal. Left Atrium: Left atrial size was normal in size. Right Atrium: Right atrial size was normal in size. Pericardium: There is no evidence of pericardial effusion. Mitral Valve: The mitral valve is degenerative in appearance. Mild to moderate mitral annular calcification. No evidence of mitral valve regurgitation. Tricuspid Valve: The tricuspid valve is normal in structure. Tricuspid valve regurgitation is mild. Aortic Valve: The aortic valve has been repaired/replaced. Aortic valve regurgitation is mild. Aortic valve mean gradient measures 11.8 mmHg. Aortic valve peak gradient measures 21.4 mmHg. Aortic valve area, by VTI measures 1.36 cm. 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 Pulmonic Valve: The pulmonic valve was normal in structure. Pulmonic valve regurgitation is mild. Aorta: The aortic root is normal in size and structure. Venous: The inferior  vena cava was not well visualized. IAS/Shunts: No atrial level shunt detected by color flow Doppler.  LEFT VENTRICLE PLAX 2D LV EF:         Left            Diastology                ventricular     LV e' medial:    6.09 cm/s                ejection        LV E/e' medial:  17.6                fraction by     LV e' lateral:   10.40 cm/s                PLAX is 59      LV E/e' lateral: 10.3                %. LVIDd:         4.80 cm LVIDs:         3.30 cm LV PW:         1.10 cm LV IVS:        1.30 cm LVOT diam:     2.20 cm LV SV:         67 LV SV Index:   29 LVOT Area:     3.80 cm  RIGHT VENTRICLE RV Basal diam:  3.90 cm RV S prime:     13.20 cm/s TAPSE (M-mode): 2.1 cm LEFT ATRIUM             Index        RIGHT ATRIUM           Index LA diam:        3.60 cm 1.56 cm/m   RA Area:     13.40 cm LA Vol (A2C):  34.0 ml 14.74 ml/m  RA Volume:   29.50 ml  12.79 ml/m LA Vol (A4C):   31.2 ml 13.52 ml/m LA Biplane Vol: 34.1 ml 14.78 ml/m  AORTIC VALVE                     PULMONIC VALVE AV Area (Vmax):    1.40 cm      PV Vmax:        1.44 m/s AV Area (Vmean):   1.40 cm      PV Peak grad:   8.3 mmHg AV Area (VTI):     1.36 cm      RVOT Peak grad: 4 mmHg AV Vmax:           231.50 cm/s AV Vmean:          157.750 cm/s AV VTI:            0.492 m AV Peak Grad:      21.4 mmHg AV Mean Grad:      11.8 mmHg LVOT Vmax:         85.40 cm/s LVOT Vmean:        58.100 cm/s LVOT VTI:          0.176 m LVOT/AV VTI ratio: 0.36  AORTA Ao Root diam: 3.40 cm MITRAL VALVE MV Area (PHT): 2.57 cm     SHUNTS MV Decel Time: 295 msec     Systemic VTI:  0.18 m MV E velocity: 107.00 cm/s  Systemic Diam: 2.20 cm MV A velocity: 132.00 cm/s MV E/A ratio:  0.81 Debbe Odea MD Electronically signed by Debbe Odea MD Signature Date/Time: 04/06/2023/4:06:54 PM    Final    MR BRAIN WO CONTRAST  Result Date: 04/05/2023 CLINICAL DATA:  Altered mental status EXAM: MRI HEAD WITHOUT CONTRAST TECHNIQUE: Multiplanar, multiecho pulse sequences of the  brain and surrounding structures were obtained without intravenous contrast. COMPARISON:  None Available. FINDINGS: Brain: No acute infarct, mass effect or extra-axial collection. No acute or chronic hemorrhage. There is multifocal hyperintense T2-weighted signal within the white matter. Generalized volume loss. The midline structures are normal. Vascular: Major flow voids are preserved. Skull and upper cervical spine: Normal calvarium and skull base. Visualized upper cervical spine and soft tissues are normal. Sinuses/Orbits:No paranasal sinus fluid levels or advanced mucosal thickening. No mastoid or middle ear effusion. Normal orbits. IMPRESSION: 1. No acute intracranial abnormality. 2. 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 Electronically Signed   By: Deatra Robinson M.D.   On: 04/05/2023 21:28   CT CHEST ABDOMEN PELVIS W CONTRAST  Result Date: 04/05/2023 CLINICAL DATA:  Sepsis EXAM: CT CHEST, ABDOMEN, AND PELVIS WITH CONTRAST TECHNIQUE: Multidetector CT imaging of the chest, abdomen and pelvis was performed following the standard protocol during bolus administration of intravenous contrast. RADIATION DOSE REDUCTION: This exam was performed according to the departmental dose-optimization program which includes automated exposure control, adjustment of the mA and/or kV according to patient size and/or use of iterative reconstruction technique. CONTRAST:  OMNIPAQUE IOHEXOL 300 MG/ML  SOLN COMPARISON:  Chest x-ray earlier 04/05/2023. CT angiogram chest 03/14/2022. Abdomen pelvis CT 07/20/2019. Multiple other comparison exams FINDINGS: CT CHEST FINDINGS Cardiovascular: Status post TAVR. Heart is nonenlarged. No pericardial effusion. The thoracic aorta overall has a normal course and caliber. Coronary artery calcifications are seen. Mediastinum/Nodes: Mildly patulous esophagus. No specific abnormal lymph node enlargement identified in the axillary region, hilum or mediastinum.  Lungs/Pleura: No consolidation, pneumothorax or effusion. Breathing motion  seen throughout the examination. There is some dependent atelectasis seen with some also areas of scarring and fibrotic change. Musculoskeletal: Scattered degenerative changes along the spine. There are multiple posterior endplate osteophytes identified with areas of stenosis in the thoracic spine region. There is severe compression of the T12 vertebral level. This was not seen on the prior CT scan. There is some lucencies along the inferior endplate with some air. This could be a benign compression but could be more acute to subacute. Please correlate with clinical history. Streak artifact related to the patient's left shoulder arthroplasty. Gynecomastia. CT ABDOMEN PELVIS FINDINGS Hepatobiliary: Nodular liver consistent with chronic liver disease. 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 Main portal vein is patent with diameter at the liver hilum approaching 18 mm. Pancreas: Unremarkable. No pancreatic ductal dilatation or surrounding inflammatory changes. Spleen: Small splenule at the hilum. Spleen has a cephalocaudal length approaching 20 cm. Preserved enhancement. Adrenals/Urinary Tract: Adrenal glands are preserved. Stable lower pole left-sided renal cyst with a parapelvic component measuring 4 cm. Bosniak 1 lesion. There are some small exophytic similar cysts on the right side. No specific follow-up. Preserved contours of the urinary bladder. Mild bladder wall thickening. Stomach/Bowel: Scattered colonic stool. There is scattered diffuse colonic diverticula. Bowel is nondilated. Stomach is underdistended. Small bowel is nondilated. Vascular/Lymphatic: Normal caliber aorta and IVC with scattered mild vascular calcifications. No specific abnormal lymph node enlargement seen in the abdomen and pelvis. Reproductive: Enlarged prostate with mass effect along the base of the bladder. Other: Trace ascites in the pelvis.  There is motion throughout the examination limiting evaluation particularly of the bowel and mesentery. Musculoskeletal: Advanced degenerative changes along the spine with multilevel stenosis. Again compression of T12. streak artifact as the patient's arms were scanned at the patient's side across the abdomen and pelvis. IMPRESSION: Evidence of chronic liver disease with a nodular heterogeneous liver, splenomegaly. Trace ascites. Patent tips shunt. Colonic diverticulosis. No bowel obstruction or free air. No rim enhancing fluid collections. No consolidation, pneumothorax or effusion. Severe compression of the T12 level which was not seen previously. Based on appearance this could be subacute. Please correlate with specific clinical history and symptomatology. Electronically Signed   By: Karen Kays M.D.   On: 04/05/2023 20:36   CT Head Wo Contrast  Result Date: 04/05/2023 CLINICAL DATA:  Mental status change, unknown cause EXAM: CT HEAD WITHOUT CONTRAST TECHNIQUE: Contiguous axial images were obtained from the base of the skull through the vertex without intravenous contrast. RADIATION DOSE REDUCTION: This exam was performed according to the departmental dose-optimization program which includes automated exposure control, adjustment of the mA and/or kV according to patient size and/or use of iterative reconstruction technique. COMPARISON:  08/02/2022 FINDINGS: Brain: No evidence of acute infarction, hemorrhage, hydrocephalus, extra-axial collection or mass lesion/mass effect. Patchy low-density changes within the periventricular and subcortical white matter most compatible with chronic microvascular ischemic change. Mild diffuse cerebral volume loss. Vascular: No hyperdense vessel or unexpected calcification. Skull: Normal. Negative for fracture or focal lesion. Sinuses/Orbits: No acute finding. Other: None. IMPRESSION: 1. No acute intracranial abnormality. 2. Chronic microvascular ischemic change and cerebral  volume loss. Electronically Signed   By: Duanne Guess D.O.   On: 04/05/2023 17:13   DG Chest Port 1 View  Result Date: 04/05/2023 CLINICAL DATA:  Altered mental status EXAM: PORTABLE CHEST 1 VIEW COMPARISON:  CXR 07/31/22 FINDINGS: Status post TAVR. Possible trace left pleural effusion. No pneumothorax unchanged cardiac and mediastinal contours. No focal  airspace opacity. There are prominent interstitial opacities, unchanged from prior exam. No radiographically apparent displaced rib fractures. Visualized upper abdomen is unremarkable. Surgical clips in the right upper quadrant. IMPRESSION: No focal airspace opacity. Electronically Signed   By: Lorenza Cambridge M.D.   On: 04/05/2023 16:52             LOS: 3 days       Sunnie Nielsen, DO [facility] 04/08/2023, 3:06 PM    Dictation software may have been used to generate the above note. Typos may occur and escape review in typed/dictated notes. Please contact Dr Lyn Hollingshead directly for clarity if needed.  Staff may message me via secure chat in Epic  but this may not receive an immediate response,  please page me for urgent matters!  If 7PM-7AM, please contact night coverage [email or web address]

## 2023-04-09 ENCOUNTER — Encounter: Payer: Self-pay | Admitting: Osteopathic Medicine

## 2023-04-09 ENCOUNTER — Inpatient Hospital Stay (HOSPITAL_COMMUNITY)
Admit: 2023-04-09 | Discharge: 2023-04-09 | Disposition: A | Discharge status: Home / Self Care | Payer: No Typology Code available for payment source | Attending: Nurse Practitioner | Admitting: Nurse Practitioner

## 2023-04-09 ENCOUNTER — Inpatient Hospital Stay: Payer: No Typology Code available for payment source | Admitting: Registered Nurse

## 2023-04-09 ENCOUNTER — Encounter
Admission: EM | Disposition: A | Discharge status: Skilled Nursing Facility | Payer: Self-pay | Source: Skilled Nursing Facility | Attending: Obstetrics and Gynecology

## 2023-04-09 ENCOUNTER — Inpatient Hospital Stay: Payer: No Typology Code available for payment source

## 2023-04-09 DIAGNOSIS — I361 Nonrheumatic tricuspid (valve) insufficiency: Secondary | ICD-10-CM

## 2023-04-09 DIAGNOSIS — R7881 Bacteremia: Secondary | ICD-10-CM

## 2023-04-09 DIAGNOSIS — K766 Portal hypertension: Secondary | ICD-10-CM

## 2023-04-09 DIAGNOSIS — K7581 Nonalcoholic steatohepatitis (NASH): Secondary | ICD-10-CM | POA: Diagnosis not present

## 2023-04-09 DIAGNOSIS — B955 Unspecified streptococcus as the cause of diseases classified elsewhere: Secondary | ICD-10-CM | POA: Diagnosis not present

## 2023-04-09 HISTORY — PX: TEE WITHOUT CARDIOVERSION: SHX5443

## 2023-04-09 LAB — ECHO TEE

## 2023-04-09 LAB — BASIC METABOLIC PANEL
Anion gap: 7 (ref 5–15)
BUN: 16 mg/dL (ref 8–23)
CO2: 24 mmol/L (ref 22–32)
Calcium: 8.3 mg/dL — ABNORMAL LOW (ref 8.9–10.3)
Chloride: 104 mmol/L (ref 98–111)
Creatinine, Ser: 0.94 mg/dL (ref 0.61–1.24)
GFR, Estimated: >60 mL/min (ref >60)
Glucose, Bld: 96 mg/dL (ref 70–99)
Potassium: 3.4 mmol/L — ABNORMAL LOW (ref 3.5–5.1)
Sodium: 135 mmol/L (ref 135–145)

## 2023-04-09 LAB — CBC
HCT: 33 % — ABNORMAL LOW (ref 39.0–52.0)
Hemoglobin: 11.7 g/dL — ABNORMAL LOW (ref 13.0–17.0)
MCH: 32.7 pg (ref 26.0–34.0)
MCHC: 35.5 g/dL (ref 30.0–36.0)
MCV: 92.2 fL (ref 80.0–100.0)
Platelets: 46 10*3/uL — ABNORMAL LOW (ref 150–400)
RBC: 3.58 MIL/uL — ABNORMAL LOW (ref 4.22–5.81)
RDW: 15.5 % (ref 11.5–15.5)
WBC: 4.8 10*3/uL (ref 4.0–10.5)
nRBC: 0 % (ref 0.0–0.2)

## 2023-04-09 LAB — GLUCOSE, CAPILLARY
Glucose-Capillary: 95 mg/dL (ref 70–99)
Glucose-Capillary: 86 mg/dL (ref 70–99)
Glucose-Capillary: 149 mg/dL — ABNORMAL HIGH (ref 70–99)
Glucose-Capillary: 120 mg/dL — ABNORMAL HIGH (ref 70–99)

## 2023-04-09 SURGERY — ECHOCARDIOGRAM, TRANSESOPHAGEAL
Anesthesia: Moderate Sedation

## 2023-04-09 MED ORDER — IOHEXOL 300 MG/ML  SOLN
75.0000 mL | Freq: Once | INTRAMUSCULAR | Status: AC | PRN
  Administered 2023-04-09: 75 mL via INTRAVENOUS

## 2023-04-09 MED ORDER — LIDOCAINE VISCOUS HCL 2 % MT SOLN
OROMUCOSAL | Status: AC
  Filled 2023-04-09: qty 15

## 2023-04-09 MED ORDER — FENTANYL CITRATE (PF) 100 MCG/2ML IJ SOLN
INTRAMUSCULAR | Status: AC
  Filled 2023-04-09: qty 2

## 2023-04-09 MED ORDER — FENTANYL CITRATE (PF) 100 MCG/2ML IJ SOLN
INTRAMUSCULAR | Status: AC | PRN
  Administered 2023-04-09 (×2): 25 ug via INTRAVENOUS

## 2023-04-09 MED ORDER — MIDAZOLAM HCL 2 MG/2ML IJ SOLN
INTRAMUSCULAR | Status: AC
  Filled 2023-04-09: qty 4

## 2023-04-09 MED ORDER — MIDAZOLAM HCL 2 MG/2ML IJ SOLN
INTRAMUSCULAR | Status: AC | PRN
  Administered 2023-04-09: 1 mg via INTRAVENOUS

## 2023-04-09 MED ORDER — LIDOCAINE VISCOUS HCL 2 % MT SOLN
OROMUCOSAL | Status: AC | PRN
  Administered 2023-04-09: 15 mL via OROMUCOSAL

## 2023-04-09 MED ORDER — BUTAMBEN-TETRACAINE-BENZOCAINE 2-2-14 % EX AERO
INHALATION_SPRAY | CUTANEOUS | Status: AC
  Filled 2023-04-09: qty 5

## 2023-04-09 NOTE — Progress Notes (Signed)
Received pt. Into specials recovery #15 for TEE: pt. Oriented to self , year, & knows "I'm in the hospital." Able to state 2024, but not month. Speech clear. Pt. Unable to states what test he is going to have done. Dr. Azucena Cecil at bedside, explaining to pt. TEE & reason for exam. Pt. Nods head in agreement. Consent done via telephone consent with wife Joshua Berry.

## 2023-04-09 NOTE — Progress Notes (Signed)
OT Cancellation Note  Patient Details Name: Joshua Berry MRN: [id number] DOB: [birth year]   Cancelled Treatment:    Reason Eval/Treat Not Completed: Patient at procedure or test/ unavailable. Pt currently off the floor at procedure. Will re-attempt as able.   Gerrie Nordmann 04/09/2023, 9:13 AM

## 2023-04-09 NOTE — Progress Notes (Signed)
PT Cancellation Note  Patient Details Name: Joshua Berry MRN: [id number] DOB: [birth year]   Cancelled Treatment:     PT attempt. Transport in room to take pt off unit. Will return later this date and continue to follow per current POC.    [name] 04/09/2023, 7:44 AM

## 2023-04-09 NOTE — Procedures (Signed)
Transesophageal Echocardiogram :  Indication: bacteremia Requesting/ordering  physician:   Procedure: 10 ml of viscous lidocaine were given orally to provide local anesthesia to the oropharynx. The patient was positioned supine on the left side, bite block provided. The patient was moderately sedated with the doses of versed and fentanyl as detailed below.  Using digital technique an omniplane probe was advanced into the esophagus without incident.   Moderate sedation: 1. Sedation used:  Versed: , Fentanyl: 2. Time administered:  8:06  Time when patient started recovery:820 3. I was face to face during this time: 14 mins  See report in EPIC  for complete details: In brief, imaging revealed normal LV function with no RWMAs and no mural apical thrombus.  Estimated ejection fraction was 55-60%%.  Right sided cardiac chambers were normal with no evidence of pulmonary hypertension.  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  There is no evidence for vegetation or endocarditis  The LA was well visualized in orthogonal views.  There was no spontaneous contrast and no thrombus in the LA and LA appendage   The descending thoracic aorta had no  mural aortic debris with no evidence of aneurysmal dilation or disection   Conclusion No evidence for endocarditis  Debbe Odea 04/09/2023 8:28 AM

## 2023-04-09 NOTE — Progress Notes (Addendum)
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   Secondary esophageal varices without bleeding   Portal hypertension   HLD (hyperlipidemia)   HTN (hypertension)   Chronic diastolic CHF (congestive heart failure)   Pancytopenia   Hypothyroidism   Physical deconditioning   Lactic acidosis   Hypomagnesemia   Acute hepatic encephalopathy   Overweight (BMI 25.0-29.9)   Hepatic encephalopathy   Altered mental status   Current Antibiotics:  Ceftriaxone   Lines/Hardwares: mechanical AVR   Interval Events: T max 100.2 , TEE today    Assessment 12 Y O male with multiple comorbidities as below including HLD, DM, NASH Liver cirrhosis w chronic thrombocytopenia, UGIB s/p TIPS, s/p TAVR, Pancytopenia, malignant melanoma, group b strep bacteremia in Jan 2020 with rt glenohumeral infection /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 MRI rt shoulder 4/24 negative for infection. TEE 4/24 negative for endocarditis. Repeat blood cx 4/22 NG in 2 days. He has poor dental hygiene with multiple missing teeth and suspect to be the source   # Mild dilatation of Aortic root - per Cardiology/CVTS  # NASH Liver cirrhosis w portal HTN/Esophageal varices/thrombocytopenia w hepatic encephalopathy - on rifaximin and lactulose, mental status seems to be improving    Recommendations Continue ceftriaxone for now Fu repeat blood cx 4/22 to make sure negative for 72 hrs. If negative for 72 hrs, OK to switch to PO amoxicillin 1g po tid for 2 weeks from date of negative blood cx 4/22. EOT 04/21/23 Will need dental evaluation given dental caries  ID available as needed, please recall if needed.   Rest of the management as per the primary team. Thank you for the consult. Please page with pertinent questions or concerns.  ______________________________________________________________________ Subjective patient seen and examined at the bedside. No complaints at his left shoulder, chronic pain at Rt shoulder.   Past Medical History:  Diagnosis Date   Arthritis    Bleeding ulcer    Cancer    melanoma, carcinoma   Cirrhosis of liver    Diabetes mellitus without complication    Heart murmur    Hypertension    Iron deficiency anemia    NASH (nonalcoholic steatohepatitis)    Sleep apnea    Spleen enlarged    Thrombocytopenia     Past Surgical History:  Procedure Laterality Date   CHOLECYSTECTOMY     COLONOSCOPY N/A 07/23/2019   Procedure: COLONOSCOPY;  Surgeon: Toney Reil, MD;  Location: [facility] ENDOSCOPY;  Service: Gastroenterology;  Laterality: N/A;   ESOPHAGOGASTRODUODENOSCOPY N/A 07/20/2019   Procedure: ESOPHAGOGASTRODUODENOSCOPY (EGD);  Surgeon: Toney Reil, MD;  Location: [facility] ENDOSCOPY;  Service: Gastroenterology;  Laterality: N/A;   ESOPHAGOGASTRODUODENOSCOPY Left 08/20/2019   Procedure: ESOPHAGOGASTRODUODENOSCOPY (EGD);  Surgeon: Pasty Spillers, MD;  Location: [facility] ENDOSCOPY;  Service: Endoscopy;  Laterality: Left;   ESOPHAGOGASTRODUODENOSCOPY (EGD) WITH PROPOFOL N/A 04/25/2021   Procedure: ESOPHAGOGASTRODUODENOSCOPY (EGD) WITH PROPOFOL;  Surgeon: Toney Reil, MD;  Location: [facility] ENDOSCOPY;  Service: Gastroenterology;  Laterality: N/A;   MECHANICAL AORTIC VALVE REPLACEMENT     SKIN BIOPSY     TEE WITHOUT  CARDIOVERSION N/A 12/23/2018   Procedure: TRANSESOPHAGEAL ECHOCARDIOGRAM (TEE);  Surgeon: Dalia Heading, MD;  Location: [facility] ORS;  Service: Cardiovascular;  Laterality: N/A;   TEE WITHOUT CARDIOVERSION N/A 05/21/2022   Procedure: TRANSESOPHAGEAL ECHOCARDIOGRAM (TEE);  Surgeon: Antonieta Iba, MD;  Location: [facility] ORS;  Service: Cardiovascular;  Laterality: N/A;    Vitals BP (!) 111/57   Pulse (!) 52   Temp 98.7 F (37.1 C) (Oral)   Resp 13   Ht 6\' 2"  (1.88 m)   Wt 104.4 kg   SpO2 93%   BMI 29.56 kg/m     Physical Exam Constitutional:  elderly male sitting in the bed seen post TEE     Comments:   Cardiovascular:     Rate and Rhythm: Normal rate and regular rhythm.     Heart sounds: s1 and s2, murmur +  Pulmonary:     Effort: Pulmonary effort is normal on room air     Comments: Normal breath sounds   Abdominal:     Palpations: Abdomen is soft.     Tenderness: non distended and non tender   Musculoskeletal:        General: No signs of septic peripheral joints   Skin:    Comments: No rashes, changes of chronic venous hyperpigmentation in the lower legs b/l   Neurological:     General: awake, alert and oriented, following basic commands   Psychiatric:        Mood and Affect: Mood normal.   Pertinent Microbiology Results for orders placed or performed during the hospital encounter of 04/05/23  Resp panel by RT-PCR (RSV, Flu A&B, Covid)     Status: None   Collection Time: 04/05/23  9:24 PM   Specimen: Nasal Swab  Result Value Ref Range Status   SARS Coronavirus 2 by RT PCR NEGATIVE NEGATIVE Final    Comment: (NOTE) SARS-CoV-2 target nucleic acids are NOT DETECTED.  The SARS-CoV-2 RNA is generally detectable in upper respiratory specimens during the acute phase of infection. The lowest concentration of SARS-CoV-2 viral copies this assay can detect is 138 copies/mL. A negative result does not preclude SARS-Cov-2 infection and should not be used as the sole  basis for treatment or other patient management decisions. A negative result may occur with  improper specimen collection/handling, submission of specimen other than nasopharyngeal swab, presence of viral mutation(s) within the areas targeted by this assay, and inadequate number of viral copies(<138 copies/mL). A negative result must be combined with clinical observations, patient history, and epidemiological information. The expected result is Negative.  Fact Sheet for Patients:  BloggerCourse.com  Fact Sheet for Healthcare Providers:  SeriousBroker.it  This test is no t yet approved or cleared by the [place] FDA and  has been authorized for detection and/or diagnosis of SARS-CoV-2 by FDA under an Emergency Use Authorization (EUA). This EUA will remain  in effect (meaning this test can be used) for the duration of the COVID-19 declaration under Section 564(b)(1) of the Act, 21 U.S.C.section 360bbb-3(b)(1), unless the authorization is terminated  or revoked sooner.       Influenza A by PCR NEGATIVE NEGATIVE Final   Influenza B by PCR NEGATIVE NEGATIVE Final    Comment: (NOTE) The Xpert Xpress SARS-CoV-2/FLU/RSV plus assay is intended as an aid in the diagnosis of influenza from Nasopharyngeal swab specimens and should not be used as  a sole basis for treatment. Nasal washings and aspirates are unacceptable for Xpert Xpress SARS-CoV-2/FLU/RSV testing.  Fact Sheet for Patients: BloggerCourse.com  Fact Sheet for Healthcare Providers: SeriousBroker.it  This test is not yet approved or cleared by the [place] FDA and has been authorized for detection and/or diagnosis of SARS-CoV-2 by FDA under an Emergency Use Authorization (EUA). This EUA will remain in effect (meaning this test can be used) for the duration of the COVID-19 declaration under Section 564(b)(1) of the Act,  21 U.S.C. section 360bbb-3(b)(1), unless the authorization is terminated or revoked.     Resp Syncytial Virus by PCR NEGATIVE NEGATIVE Final    Comment: (NOTE) Fact Sheet for Patients: BloggerCourse.com  Fact Sheet for Healthcare Providers: SeriousBroker.it  This test is not yet approved or cleared by the [place] FDA and has been authorized for detection and/or diagnosis of SARS-CoV-2 by FDA under an Emergency Use Authorization (EUA). This EUA will remain in effect (meaning this test can be used) for the duration of the COVID-19 declaration under Section 564(b)(1) of the Act, 21 U.S.C. section 360bbb-3(b)(1), unless the authorization is terminated or revoked.  Performed at [facility], [address] [place], [place] [address]   Culture, blood (Routine X 2) w Reflex to ID Panel     Status: Abnormal   Collection Time: 04/05/23  9:24 PM   Specimen: BLOOD RIGHT ARM  Result Value Ref Range Status   Specimen Description   Final    BLOOD RIGHT ARM Performed at [facility], [address] [place], [place] [address]    Special Requests   Final    BOTTLES DRAWN AEROBIC AND ANAEROBIC Blood Culture adequate volume Performed at [facility], [address] [place], [place] [address]    Culture  Setup Time   Final    GRAM POSITIVE COCCI IN BOTH AEROBIC AND ANAEROBIC BOTTLES CRITICAL RESULT CALLED TO, READ BACK BY AND VERIFIED WITH: CAROLYN COULTER AT 1211 04/06/23.PMF  REVIEWED BY A. LAFRANCE    Culture (A)  Final    STREPTOCOCCUS ANGINOSIS SUSCEPTIBILITIES PERFORMED ON PREVIOUS CULTURE WITHIN THE LAST 5 DAYS. Performed at [facility], [address], [place], [place] [address]    Report Status 04/08/2023 FINAL  Final  Culture, blood (Routine X 2) w Reflex to ID Panel     Status: Abnormal   Collection Time: 04/05/23  9:24 PM   Specimen: BLOOD LEFT ARM  Result Value Ref Range Status    Specimen Description BLOOD LEFT ARM  Final   Special Requests   Final    BOTTLES DRAWN AEROBIC AND ANAEROBIC Blood Culture results may not be optimal due to an inadequate volume of blood received in culture bottles   Culture  Setup Time   Final    GRAM POSITIVE COCCI IN BOTH AEROBIC AND ANAEROBIC BOTTLES CRITICAL RESULT CALLED TO, READ BACK BY AND VERIFIED WITH: CAROLYN COULTER AT 1211 04/06/23.PMF  REVIEWED BY A. LAFRANCE    Culture STREPTOCOCCUS ANGINOSIS (A)  Final   Report Status 04/08/2023 FINAL  Final   Organism ID, Bacteria STREPTOCOCCUS ANGINOSIS  Final      Susceptibility   Streptococcus anginosis - MIC*    PENICILLIN <=0.06 SENSITIVE Sensitive     CEFTRIAXONE <=0.12 SENSITIVE Sensitive     ERYTHROMYCIN 1 RESISTANT Resistant     LEVOFLOXACIN 0.5 SENSITIVE Sensitive     VANCOMYCIN 0.5 SENSITIVE Sensitive     * STREPTOCOCCUS ANGINOSIS  Blood Culture ID Panel (  Reflexed)     Status: Abnormal   Collection Time: 04/05/23  9:24 PM  Result Value Ref Range Status   Enterococcus faecalis NOT DETECTED NOT DETECTED Final   Enterococcus Faecium NOT DETECTED NOT DETECTED Final   Listeria monocytogenes NOT DETECTED NOT DETECTED Final   Staphylococcus species NOT DETECTED NOT DETECTED Final   Staphylococcus aureus (BCID) NOT DETECTED NOT DETECTED Final   Staphylococcus epidermidis NOT DETECTED NOT DETECTED Final   Staphylococcus lugdunensis NOT DETECTED NOT DETECTED Final   Streptococcus species DETECTED (A) NOT DETECTED Final    Comment: Not Enterococcus species, Streptococcus agalactiae, Streptococcus pyogenes, or Streptococcus pneumoniae. CRITICAL RESULT CALLED TO, READ BACK BY AND VERIFIED WITH: CAROLYN COULTER AT 1211 04/06/23.PMF    Streptococcus agalactiae NOT DETECTED NOT DETECTED Final   Streptococcus pneumoniae NOT DETECTED NOT DETECTED Final   Streptococcus pyogenes NOT DETECTED NOT DETECTED Final   A.calcoaceticus-baumannii NOT DETECTED NOT DETECTED Final   Bacteroides  fragilis NOT DETECTED NOT DETECTED Final   Enterobacterales NOT DETECTED NOT DETECTED Final   Enterobacter cloacae complex NOT DETECTED NOT DETECTED Final   Escherichia coli NOT DETECTED NOT DETECTED Final   Klebsiella aerogenes NOT DETECTED NOT DETECTED Final   Klebsiella oxytoca NOT DETECTED NOT DETECTED Final   Klebsiella pneumoniae NOT DETECTED NOT DETECTED Final   Proteus species NOT DETECTED NOT DETECTED Final   Salmonella species NOT DETECTED NOT DETECTED Final   Serratia marcescens NOT DETECTED NOT DETECTED Final   Haemophilus influenzae NOT DETECTED NOT DETECTED Final   Neisseria meningitidis NOT DETECTED NOT DETECTED Final   Pseudomonas aeruginosa NOT DETECTED NOT DETECTED Final   Stenotrophomonas maltophilia NOT DETECTED NOT DETECTED Final   Candida albicans NOT DETECTED NOT DETECTED Final   Candida auris NOT DETECTED NOT DETECTED Final   Candida glabrata NOT DETECTED NOT DETECTED Final   Candida krusei NOT DETECTED NOT DETECTED Final   Candida parapsilosis NOT DETECTED NOT DETECTED Final   Candida tropicalis NOT DETECTED NOT DETECTED Final   Cryptococcus neoformans/gattii NOT DETECTED NOT DETECTED Final    Comment: Performed at [facility], [address] [place], [place] [address]  Culture, blood (Routine X 2) w Reflex to ID Panel     Status: None (Preliminary result)   Collection Time: 04/07/23 12:41 PM   Specimen: BLOOD  Result Value Ref Range Status   Specimen Description BLOOD LEFT ANTECUBITAL  Final   Special Requests   Final    BOTTLES DRAWN AEROBIC AND ANAEROBIC Blood Culture adequate volume   Culture   Final    NO GROWTH 2 DAYS Performed at [facility], [address] [place], [place] [address]    Report Status PENDING  Incomplete  Culture, blood (Routine X 2) w Reflex to ID Panel     Status: None (Preliminary result)   Collection Time: 04/07/23 12:41 PM   Specimen: BLOOD  Result Value Ref Range Status   Specimen Description BLOOD  RIGHT ANTECUBITAL  Final   Special Requests   Final    BOTTLES DRAWN AEROBIC AND ANAEROBIC Blood Culture adequate volume   Culture   Final    NO GROWTH 2 DAYS Performed at [facility], [address], [place], [place] [address]    Report Status PENDING  Incomplete    Pertinent Lab.    Latest Ref Rng & Units 04/09/2023    5:45 AM 04/07/2023    5:30 AM 04/06/2023    5:18 AM  CBC  WBC 4.0 - 10.5 K/uL 4.8  5.1  5.5   Hemoglobin 13.0 - 17.0 g/dL 16.1  09.6  04.5   Hematocrit 39.0 - 52.0 % 33.0  34.4  35.4   Platelets 150 - 400 K/uL 46  32  31       Latest Ref Rng & Units 04/09/2023    5:45 AM 04/07/2023    5:30 AM 04/06/2023    5:18 AM  CMP  Glucose 70 - 99 mg/dL 96  409  811   BUN 8 - 23 mg/dL 16  16  17    Creatinine 0.61 - 1.24 mg/dL 9.14  7.82  9.56   Sodium 135 - 145 mmol/L 135  138  134   Potassium 3.5 - 5.1 mmol/L 3.4  3.7  3.8   Chloride 98 - 111 mmol/L 104  109  106   CO2 22 - 32 mmol/L 24  23  20    Calcium 8.9 - 10.3 mg/dL 8.3  8.4  8.6   Total Protein 6.5 - 8.1 g/dL  5.7    Total Bilirubin 0.3 - 1.2 mg/dL  2.5    Alkaline Phos 38 - 126 U/L  71    AST 15 - 41 U/L  71    ALT 0 - 44 U/L  29       Pertinent Imaging today Plain films and CT images have been personally visualized and interpreted; radiology reports have been reviewed. Decision making incorporated into the Impression  CT MAXILLOFACIAL W CONTRAST  Result Date: 04/09/2023 CLINICAL DATA:  Unclear source of strep bacteremia with concerns for dental infection. EXAM: CT MAXILLOFACIAL WITH CONTRAST TECHNIQUE: Multidetector CT imaging of the maxillofacial structures was performed with intravenous contrast. Multiplanar CT image reconstructions were also generated. RADIATION DOSE REDUCTION: This exam was performed according to the departmental dose-optimization program which includes automated exposure control, adjustment of the mA and/or kV according to patient size and/or use of iterative reconstruction  technique. CONTRAST:  75mL OMNIPAQUE IOHEXOL 300 MG/ML  SOLN COMPARISON:  Maxillofacial CT 01/22/2019. FINDINGS: Osseous: Extensive dental caries and periapical lucencies of the remaining maxillary and mandibular teeth. Fractured right lateral maxillary incisor. Orbits: Negative. No traumatic or inflammatory finding. Sinuses: Well aerated. Soft tissues: Unremarkable. Limited intracranial: Unremarkable. IMPRESSION: Extensive dental caries and periapical lucencies of the remaining maxillary and mandibular teeth. Fractured right lateral maxillary incisor. Electronically Signed   By: Orvan Falconer M.D.   On: 04/09/2023 15:49   ECHO TEE  Result Date: 04/09/2023    TRANSESOPHOGEAL ECHO REPORT   Patient Name:   Joshua Berry Bilotti Date of Exam: 04/09/2023 Medical Rec #:  [id number]     Height:       74.0 in Accession #:    [id number]    Weight:       230.2 lb Date of Birth:  02/02/1946     BSA:          2.307 m Patient Age:    77 years      BP:           119/54 mmHg Patient Gender: M             HR:           66 bpm. Exam Location:  [facility] Procedure: Transesophageal Echo, Cardiac Doppler and Color Doppler Indications:     Bacteremia R78.81  History:         Patient has prior history of Echocardiogram examinations, most  recent 04/06/2023. Signs/Symptoms:Murmur; Risk                  Factors:Hypertension and Diabetes. S/P TAVR.                  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  Sonographer:     Cristela Blue Referring Phys:  [id number] BERGE Diagnosing Phys: Debbe Odea MD PROCEDURE: The transesophogeal probe was passed without difficulty through the esophogus of the patient. Sedation performed by performing physician. The patient developed no complications during the procedure.  IMPRESSIONS  1. Left ventricular ejection fraction, by estimation, is 55 to 60%. The left ventricle has normal function.  2. Right ventricular systolic function is  normal. The right ventricular size is normal.  3. No left atrial/left atrial appendage thrombus was detected.  4. The mitral valve is normal in structure. Trivial mitral valve regurgitation.  5. The aortic valve has been repaired/replaced. Aortic valve regurgitation is mild. 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  6. Aortic dilatation noted. There is mild dilatation of the aortic root, measuring 43 mm. Conclusion(s)/Recommendation(s): No evidence of vegetation/infective endocarditis on this transesophageael echocardiogram. FINDINGS  Left Ventricle: Left ventricular ejection fraction, by estimation, is 55 to 60%. The left ventricle has normal function. The left ventricular internal cavity size was normal in size. Right Ventricle: The right ventricular size is normal. No increase in right ventricular wall thickness. Right ventricular systolic function is normal. Left Atrium: Left atrial size was normal in size. No left atrial/left atrial appendage thrombus was detected. Right Atrium: Right atrial size was normal in size. Pericardium: There is no evidence of pericardial effusion. Mitral Valve: The mitral valve is normal in structure. Trivial mitral valve regurgitation. Tricuspid Valve: The tricuspid valve is normal in structure. Tricuspid valve regurgitation is mild. Aortic Valve: The aortic valve has been repaired/replaced. Aortic valve regurgitation is mild. 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 Pulmonic Valve: The pulmonic valve was normal in structure. Pulmonic valve regurgitation is not visualized. Aorta: Aortic dilatation noted. There is mild dilatation of the aortic root, measuring 43 mm. IAS/Shunts: No atrial level shunt detected by color flow Doppler. Debbe Odea MD Electronically signed by Debbe Odea MD Signature Date/Time: 04/09/2023/1:18:56 PM    Final    MR SHOULDER RIGHT WO CONTRAST  Result Date: 04/09/2023 CLINICAL DATA:  Shoulder pain.  Septic arthritis suspected. Osteomyelitis suspected. EXAM: MRI OF THE RIGHT SHOULDER WITHOUT CONTRAST TECHNIQUE: Multiplanar, multisequence MR imaging of the shoulder was performed. No intravenous contrast was administered. COMPARISON:  Right shoulder MRI 01/02/2019. FINDINGS: Despite efforts by the technologist and patient, mild motion artifact is present on today's exam and could not be eliminated. This reduces exam sensitivity and specificity. Rotator cuff: Progressive infraspinatus tendinosis with interval development of a small intrasubstance insertional tear extending to the bursal surface, best seen on coronal image 10/9. No full-thickness component or tendon retraction identified. There is mild supraspinatus tendinosis. The subscapularis and teres minor tendons appear normal. Muscles:  No focal muscular atrophy or edema. Biceps long head: Mild tendinosis. The tendon is intact and normally located. Acromioclavicular Joint: The acromion is type 2. There are mild acromioclavicular degenerative changes. No significant fluid is present in the subacromial - subdeltoid bursa. Glenohumeral Joint: Chronic advanced glenohumeral arthropathy which has progressed from the previous study. There is diffuse  chondral thinning with osteophyte and subchondral cyst formation. Subchondral edema in the glenoid has improved compared with the prior study, and no bone destruction is identified. There is a small shoulder joint effusion with most of the fluid anteriorly in the superior subscapularis recess and the bicipital groove. Labrum: Diffusely degenerated with degenerative tearing superiorly and posteriorly. No significant paralabral cyst. Bones: Chronic glenohumeral arthropathy without findings highly suspicious for septic arthritis or osteomyelitis. Increased subcortical cyst formation in the humeral head near the supraspinatus insertion. Other: No periarticular inflammatory changes or fluid collections are demonstrated to  suggest septic arthritis. IMPRESSION: 1. Chronic advanced glenohumeral arthropathy which has progressed from the prior study of 01/02/2019. No specific evidence of septic arthritis or osteomyelitis. 2. Progressive degenerative tearing of the labrum posteriorly and superiorly. 3. Progressive infraspinatus tendinosis with small intrasubstance insertional tear. No full-thickness rotator cuff tear, tendon retraction or focal muscular atrophy. 4. Mild bicipital tendinosis. Electronically Signed   By: Carey Bullocks M.D.   On: 04/09/2023 08:21   MR THORACIC SPINE WO CONTRAST  Result Date: 04/07/2023 CLINICAL DATA:  Mid back pain. Infection suspected. Abnormal x-ray/CT. Cirrhosis. Altered mental status. EXAM: MRI THORACIC SPINE WITHOUT CONTRAST TECHNIQUE: Multiplanar, multisequence MR imaging of the thoracic spine was performed. No intravenous contrast was administered. COMPARISON:  Chest CT 2 days ago. Lumbar MRI 05/17/2022. Chest CT 04/06/2022 chest CT 05/14/2022. FINDINGS: Alignment:  No significant malalignment. Vertebrae: Old inferior endplate fracture at T5 and superior endplate fracture at T6. Some bone marrow edema in this region that is likely related to subsequent degenerative change. No T2 bright material in the disc space. Old compression fracture at T12 with more recent worsening in the inferior T12 vertebral body portion, also with development of a Schmorl's node at the superior endplate of L1. Similarly, this is favored to be extension of previous fracture more likely than worsening changes related to spinal infection. See below. Cord:  No primary cord lesion.  See below regarding stenosis. Paraspinal and other soft tissues: No paravertebral inflammatory changes are appreciated by MRI. Disc levels: T1-2: Minor disc bulge.  Mild facet degeneration.  No stenosis. T2-3: Moderate disc bulge and facet degeneration. No compressive stenosis. T3-4: Normal interspace. T4-5: Minimal disc bulge.  No stenosis.  T5-6: Chronic disc herniation with calcification. Narrowing of the canal with slight deformity of the cord. Some subarachnoid space does surround the cord. 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 T6-7: Chronic shallow disc protrusion with partial calcification. Narrowing of the ventral subarachnoid space but no compression of the cord. T7-8: Chronic shallow protrusion more prominent towards the left with partial calcification. Narrowing of the ventral subarachnoid space but no compression of the cord. T8-9: Small central disc protrusion but no neural compression. T9-10: Small central to left posterolateral disc protrusion but no neural compression. T10-11: Endplate osteophytes and shallow protrusion of the disc. Facet and ligamentous hypertrophy. Canal narrowing with narrowing of the subarachnoid space surrounding the cord. T11-12: Disc bulge.  No stenosis. T12-L1: Disc bulge.  No stenosis. IMPRESSION: 1. Old inferior endplate fracture at T5 and superior endplate fracture at T6. Some bone marrow edema in this region is likely related to subsequent degenerative change. No T2 bright material in the disc space to suggest the presence of infection. If there are strong clinical indications of infection, 1 could consider a follow-up scan in several weeks. I do not think it is possible to completely exclude infection at this point in time. 2. Old compression fracture at T12 with more recent worsening  in the inferior T12 vertebral body portion, also with development of a Schmorl's node at the superior endplate of L1. Similarly, this is favored to be extension of previous fracture more likely than worsening changes related to spinal infection. As above, if concern persists this could be re-evaluated in several weeks. I do not think it is possible to completely exclude infection at this point in time. 3. 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 Narrowing of the ventral subarachnoid space but no  compression of the cord. Electronically Signed   By: Paulina Fusi M.D.   On: 04/07/2023 14:05   ECHOCARDIOGRAM COMPLETE  Result Date: 04/06/2023    ECHOCARDIOGRAM REPORT   Patient Name:   Joshua Berry Grondin Date of Exam: 04/06/2023 Medical Rec #:  [id number]     Height:       74.0 in Accession #:    [id number]    Weight:       230.2 lb Date of Birth:  [birth year]     BSA:          2.307 m Patient Age:    77 years      BP:           119/54 mmHg Patient Gender: M             HR:           67 bpm. Exam Location:  [facility] Procedure: 2D Echo Indications:     Bacteremia R78.81  History:         Patient has prior history of Echocardiogram examinations, most                  recent 05/17/2022.                  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  Sonographer:     Overton Mam RDCS, FASE Referring Phys:  [id number] Sunnie Nielsen Diagnosing Phys: Debbe Odea MD  Sonographer Comments: Image acquisition challenging due to respiratory motion and Image acquisition challenging due to patient body habitus. IMPRESSIONS  1. Left ventricular ejection fraction, by estimation, is 55 to 60%. Left ventricular ejection fraction by PLAX is 59 %. The left ventricle has normal function. The left ventricle has no regional wall motion abnormalities. There is mild left ventricular hypertrophy. Left ventricular diastolic parameters are consistent with Grade I diastolic dysfunction (impaired relaxation).  2. Right ventricular systolic function is normal. The right ventricular size is normal.  3. The mitral valve is degenerative. No evidence of mitral valve regurgitation.  4. The aortic valve has been repaired/replaced. Aortic valve regurgitation is mild. 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 Aortic valve mean gradient measures 11.8 mmHg. FINDINGS  Left Ventricle: Left ventricular ejection fraction, by estimation, is 55 to 60%. Left ventricular ejection fraction by PLAX is  59 %. The left ventricle has normal function. The left ventricle has no regional wall motion abnormalities. The left ventricular internal cavity size was normal in size. There is mild left ventricular hypertrophy. Left ventricular diastolic parameters are consistent with Grade I diastolic dysfunction (impaired relaxation). Right Ventricle: The right ventricular size is normal. No increase in right ventricular wall thickness. Right ventricular systolic function is normal. Left Atrium: Left atrial size was normal in size. Right Atrium: Right atrial size was normal in size. Pericardium: There is no  evidence of pericardial effusion. Mitral Valve: The mitral valve is degenerative in appearance. Mild to moderate mitral annular calcification. No evidence of mitral valve regurgitation. Tricuspid Valve: The tricuspid valve is normal in structure. Tricuspid valve regurgitation is mild. Aortic Valve: The aortic valve has been repaired/replaced. Aortic valve regurgitation is mild. Aortic valve mean gradient measures 11.8 mmHg. Aortic valve peak gradient measures 21.4 mmHg. Aortic valve area, by VTI measures 1.36 cm. 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 Pulmonic Valve: The pulmonic valve was normal in structure. Pulmonic valve regurgitation is mild. Aorta: The aortic root is normal in size and structure. Venous: The inferior vena cava was not well visualized. IAS/Shunts: No atrial level shunt detected by color flow Doppler.  LEFT VENTRICLE PLAX 2D LV EF:         Left            Diastology                ventricular     LV e' medial:    6.09 cm/s                ejection        LV E/e' medial:  17.6                fraction by     LV e' lateral:   10.40 cm/s                PLAX is 59      LV E/e' lateral: 10.3                %. LVIDd:         4.80 cm LVIDs:         3.30 cm LV PW:         1.10 cm LV IVS:        1.30 cm LVOT diam:     2.20 cm LV SV:         67 LV SV Index:   29 LVOT Area:     3.80 cm   RIGHT VENTRICLE RV Basal diam:  3.90 cm RV S prime:     13.20 cm/s TAPSE (M-mode): 2.1 cm LEFT ATRIUM             Index        RIGHT ATRIUM           Index LA diam:        3.60 cm 1.56 cm/m   RA Area:     13.40 cm LA Vol (A2C):   34.0 ml 14.74 ml/m  RA Volume:   29.50 ml  12.79 ml/m LA Vol (A4C):   31.2 ml 13.52 ml/m LA Biplane Vol: 34.1 ml 14.78 ml/m  AORTIC VALVE                     PULMONIC VALVE AV Area (Vmax):    1.40 cm      PV Vmax:        1.44 m/s AV Area (Vmean):   1.40 cm      PV Peak grad:   8.3 mmHg AV Area (VTI):     1.36 cm      RVOT Peak grad: 4 mmHg AV Vmax:           231.50 cm/s AV Vmean:          157.750 cm/s AV VTI:  0.492 m AV Peak Grad:      21.4 mmHg AV Mean Grad:      11.8 mmHg LVOT Vmax:         85.40 cm/s LVOT Vmean:        58.100 cm/s LVOT VTI:          0.176 m LVOT/AV VTI ratio: 0.36  AORTA Ao Root diam: 3.40 cm MITRAL VALVE MV Area (PHT): 2.57 cm     SHUNTS MV Decel Time: 295 msec     Systemic VTI:  0.18 m MV E velocity: 107.00 cm/s  Systemic Diam: 2.20 cm MV A velocity: 132.00 cm/s MV E/A ratio:  0.81 Debbe Odea MD Electronically signed by Debbe Odea MD Signature Date/Time: 04/06/2023/4:06:54 PM    Final    MR BRAIN WO CONTRAST  Result Date: 04/05/2023 CLINICAL DATA:  Altered mental status EXAM: MRI HEAD WITHOUT CONTRAST TECHNIQUE: Multiplanar, multiecho pulse sequences of the brain and surrounding structures were obtained without intravenous contrast. COMPARISON:  None Available. FINDINGS: Brain: No acute infarct, mass effect or extra-axial collection. No acute or chronic hemorrhage. There is multifocal hyperintense T2-weighted signal within the white matter. Generalized volume loss. The midline structures are normal. Vascular: Major flow voids are preserved. Skull and upper cervical spine: Normal calvarium and skull base. Visualized upper cervical spine and soft tissues are normal. Sinuses/Orbits:No paranasal sinus fluid levels or advanced mucosal  thickening. No mastoid or middle ear effusion. Normal orbits. IMPRESSION: 1. No acute intracranial abnormality. 2. 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 Electronically Signed   By: Deatra Robinson M.D.   On: 04/05/2023 21:28   CT CHEST ABDOMEN PELVIS W CONTRAST  Result Date: 04/05/2023 CLINICAL DATA:  Sepsis EXAM: CT CHEST, ABDOMEN, AND PELVIS WITH CONTRAST TECHNIQUE: Multidetector CT imaging of the chest, abdomen and pelvis was performed following the standard protocol during bolus administration of intravenous contrast. RADIATION DOSE REDUCTION: This exam was performed according to the departmental dose-optimization program which includes automated exposure control, adjustment of the mA and/or kV according to patient size and/or use of iterative reconstruction technique. CONTRAST:  OMNIPAQUE IOHEXOL 300 MG/ML  SOLN COMPARISON:  Chest x-ray earlier 04/05/2023. CT angiogram chest 03/14/2022. Abdomen pelvis CT 07/20/2019. Multiple other comparison exams FINDINGS: CT CHEST FINDINGS Cardiovascular: Status post TAVR. Heart is nonenlarged. No pericardial effusion. The thoracic aorta overall has a normal course and caliber. Coronary artery calcifications are seen. Mediastinum/Nodes: Mildly patulous esophagus. No specific abnormal lymph node enlargement identified in the axillary region, hilum or mediastinum. Lungs/Pleura: No consolidation, pneumothorax or effusion. Breathing motion seen throughout the examination. There is some dependent atelectasis seen with some also areas of scarring and fibrotic change. Musculoskeletal: Scattered degenerative changes along the spine. There are multiple posterior endplate osteophytes identified with areas of stenosis in the thoracic spine region. There is severe compression of the T12 vertebral level. This was not seen on the prior CT scan. There is some lucencies along the inferior endplate with some air. This could be a benign compression but could be  more acute to subacute. Please correlate with clinical history. Streak artifact related to the patient's left shoulder arthroplasty. Gynecomastia. CT ABDOMEN PELVIS FINDINGS Hepatobiliary: Nodular liver consistent with chronic liver disease. 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 Main portal vein is patent with diameter at the liver hilum approaching 18 mm. Pancreas: Unremarkable. No pancreatic ductal dilatation or surrounding inflammatory changes. Spleen: Small splenule at the hilum. Spleen has a cephalocaudal length  approaching 20 cm. Preserved enhancement. Adrenals/Urinary Tract: Adrenal glands are preserved. Stable lower pole left-sided renal cyst with a parapelvic component measuring 4 cm. Bosniak 1 lesion. There are some small exophytic similar cysts on the right side. No specific follow-up. Preserved contours of the urinary bladder. Mild bladder wall thickening. Stomach/Bowel: Scattered colonic stool. There is scattered diffuse colonic diverticula. Bowel is nondilated. Stomach is underdistended. Small bowel is nondilated. Vascular/Lymphatic: Normal caliber aorta and IVC with scattered mild vascular calcifications. No specific abnormal lymph node enlargement seen in the abdomen and pelvis. Reproductive: Enlarged prostate with mass effect along the base of the bladder. Other: Trace ascites in the pelvis. There is motion throughout the examination limiting evaluation particularly of the bowel and mesentery. Musculoskeletal: Advanced degenerative changes along the spine with multilevel stenosis. Again compression of T12. streak artifact as the patient's arms were scanned at the patient's side across the abdomen and pelvis. IMPRESSION: Evidence of chronic liver disease with a nodular heterogeneous liver, splenomegaly. Trace ascites. Patent tips shunt. Colonic diverticulosis. No bowel obstruction or free air. No rim enhancing fluid collections. No consolidation, pneumothorax or effusion.  Severe compression of the T12 level which was not seen previously. Based on appearance this could be subacute. Please correlate with specific clinical history and symptomatology. Electronically Signed   By: Karen Kays M.D.   On: 04/05/2023 20:36   CT Head Wo Contrast  Result Date: 04/05/2023 CLINICAL DATA:  Mental status change, unknown cause EXAM: CT HEAD WITHOUT CONTRAST TECHNIQUE: Contiguous axial images were obtained from the base of the skull through the vertex without intravenous contrast. RADIATION DOSE REDUCTION: This exam was performed according to the departmental dose-optimization program which includes automated exposure control, adjustment of the mA and/or kV according to patient size and/or use of iterative reconstruction technique. COMPARISON:  08/02/2022 FINDINGS: Brain: No evidence of acute infarction, hemorrhage, hydrocephalus, extra-axial collection or mass lesion/mass effect. Patchy low-density changes within the periventricular and subcortical white matter most compatible with chronic microvascular ischemic change. Mild diffuse cerebral volume loss. Vascular: No hyperdense vessel or unexpected calcification. Skull: Normal. Negative for fracture or focal lesion. Sinuses/Orbits: No acute finding. Other: None. IMPRESSION: 1. No acute intracranial abnormality. 2. Chronic microvascular ischemic change and cerebral volume loss. Electronically Signed   By: Duanne Guess D.O.   On: 04/05/2023 17:13   DG Chest Port 1 View  Result Date: 04/05/2023 CLINICAL DATA:  Altered mental status EXAM: PORTABLE CHEST 1 VIEW COMPARISON:  CXR 07/31/22 FINDINGS: Status post TAVR. Possible trace left pleural effusion. No pneumothorax unchanged cardiac and mediastinal contours. No focal airspace opacity. There are prominent interstitial opacities, unchanged from prior exam. No radiographically apparent displaced rib fractures. Visualized upper abdomen is unremarkable. Surgical clips in the right upper  quadrant. IMPRESSION: No focal airspace opacity. Electronically Signed   By: Lorenza Cambridge M.D.   On: 04/05/2023 16:52      I have personally spent at least 50 minutes involved in face-to-face and non-face-to-face activities for this patient on the day of the visit. Professional time spent includes the following activities: Preparing to see the patient (review of tests), Obtaining and/or reviewing separately obtained history (admission/discharge record), Performing a medically appropriate examination and/or evaluation , Ordering medications/tests/procedures, referring and communicating with other health care professionals, Documenting clinical information in the EMR, Independently interpreting results (not separately reported), Communicating results to the patient/family/caregiver, Counseling and educating the patient/family/caregiver and Care coordination (not separately reported).   Electronically signed by:   Odette Fraction, MD Infectious Disease  Physician [facility] Pager: [phone]

## 2023-04-09 NOTE — Progress Notes (Signed)
PROGRESS NOTE    Joshua Berry   AVW:[id number] DOB: 04-29-1946  DOA: 04/05/2023 Date of Service: 04/09/23 PCP: [facility]     Brief Narrative / Hospital Course:  Joshua Berry is a 77 year old male with history of NASH liver cirrhosis, hypertension, iron deficiency anemia, chronic thrombocytopenia, who presents from [place] assisted living via EMS on 04/05/23 to emergency department for chief concerns of altered mental status. According to his wife he became progressively altered throughout the day and afternoon yesterday. No recent illnesses, trauma, other acute medical complaints. Last night given his altered mental status did not take his lactulose.  04/20: disoriented to situation but oriented to self. (+)fever, tachypnea, low WBC 3.8 = SIRS/sepsis. Ammonia elevated to 71. Lactic 2.1 --> 1.7. COVID/flu/RSV neg. UA neg for UTI. CXR non-acute. CT head non-acute, (+)chronic microvascular ischemic change and cerebral volume loss. CT C/A/P: liver disease, patent TIPS shunt, severe T12 compression question subacute, no noted infection. Admitted for concern for sepsis / hepatic encephalopathy  04/21: MRI brain non-acute. Ammonia down to 33. BCx (+)strep, change abx to ceftriaxone, Echo ordered, will repeat BCx and ask ID to see tomorrow. PT/OT ordered.  04/22: stable. Repeat BCx. MRI to eval for osteo Tspine given compression fx --> no osteo noted. TTE no concerns. ID saw patient - will need 4 weeks IV abx.  04/23: Cardiology consulted for TEE.  TEE and mri of right shoulder negative       Consultants:  Infectious Disease Cardiology    Procedures: none           ASSESSMENT & PLAN:   Principal Problem:   Severe sepsis Active Problems:   Acute hepatic encephalopathy   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   Chronic diastolic CHF (congestive heart failure)   Hypothyroidism   Overweight (BMI 25.0-29.9)   Secondary esophageal varices  without bleeding   HLD (hyperlipidemia)   HTN (hypertension)   Physical deconditioning   Hypomagnesemia   Hepatic encephalopathy      Sepsis w/ positive BCx x4 strep, source likely dental infection  Blood cultures (+)strep  Cefepime and vancomycin per pharmacy --> ceftriaxone , today is Day 4 will need 4 weeks following culture results repeat BCx pending  ID following  TEE negative Mri right shoulder negative  Plan for ct of maxillofacial per ID   Compression fracture T-spine MRI showed no infectious process in spine Pain control   R shoulder pain Mri shows degenerative changes   Altered mental status, improving  Likely hepatic encephalopathy + sepsis  MRI of the brain without contrast no concerns  Monitor     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 twice daily lactulose 30 g p.o. twice daily   Thrombocytopenia D/t liver disease Has been unable to get dental extractions d/t this per daughter  Monitor Plt  If needing surgical procedures may have to get plt transfusion    Hypothyroidism Levothyroxine 100 mcg daily    Hypomagnesemia Magnesium sulfate 1 g IV one-time dose ordered follow magnesium level    HTN (hypertension) Atorvastatin 20 mg nightly    IDDM Type 2 SSI ac Basal insulin Adjust as needed based on Glc        DVT prophylaxis: TED hose, no Rx ppx d/t chronic thrombocytopenia  Pertinent IV fluids/nutrition: no continuous IV fluids; regular diet  Central lines / invasive devices: none   Code Status: DNR - DNR, admitting hospitalist Dr Sedalia Muta noted she confirmed this with daughter  at bedside 04/20. Daughter states patient has a MOST form  ACP documentation reviewed: 04/06/23, 8:01 AM: none on file    Current Admission Status: inpatient  TOC needs / Dispo plan:  tbd. Alf can't take back on iv abx, needs snf Barriers to discharge / significant pending items: snf  placement          Subjective / Brief ROS:  Patient reports no concerns Recovering from tee    Family Communication: daughter at bedside 4/24     Objective Findings:  Vitals:   04/09/23 0820 04/09/23 0825 04/09/23 0830 04/09/23 0845  BP: (!) 108/44 (!) 107/51 (!) 112/51 (!) 111/57  Pulse: 73 71 71 (!) 52  Resp: 14 17 13 13   Temp:      TempSrc:      SpO2: 93% 95% 94% 93%  Weight:      Height:        Intake/Output Summary (Last 24 hours) at 04/09/2023 1355 Last data filed at 04/09/2023 1130 Gross per 24 hour  Intake 405.09 ml  Output 1025 ml  Net -619.91 ml    Filed Weights   04/05/23 1628  Weight: 104.4 kg    Examination:  NAD Confused CTAB RRR Ext warm, no edema       Scheduled Medications:   acidophilus  1 capsule Oral Daily   ascorbic acid  1,000 mg Oral Daily   atorvastatin  20 mg Oral QHS   famotidine  20 mg Oral Daily   fentaNYL       folic acid  1 mg Oral Daily   insulin aspart  0-9 Units Subcutaneous TID WC   insulin aspart  6 Units Subcutaneous TID WC   insulin glargine-yfgn  10 Units Subcutaneous QHS   lactulose  30 g Oral BID   levothyroxine  100 mcg Oral Q0600   lidocaine       magnesium oxide  800 mg Oral BID   melatonin  10 mg Oral QHS   midazolam       mineral oil-hydrophilic petrolatum   Topical BID   pantoprazole  40 mg Oral BID   rifaximin  550 mg Oral BID    Continuous Infusions:  cefTRIAXone (ROCEPHIN)  IV 2 g (04/09/23 1314)    PRN Medications:  acetaminophen **OR** acetaminophen, fentaNYL, lidocaine, midazolam, ondansetron **OR** ondansetron (ZOFRAN) IV, oxyCODONE, senna-docusate, trolamine salicylate  Antimicrobials from admission:  Anti-infectives (From admission, onward)    Start     Dose/Rate Route Frequency Ordered Stop   04/06/23 2200  vancomycin (VANCOREADY) IVPB 2000 mg/400 mL  Status:  Discontinued        2,000 mg 200 mL/hr over 120 Minutes Intravenous Every 24 hours 04/05/23 2054 04/06/23 1224    04/06/23 1400  cefTRIAXone (ROCEPHIN) 2 g in sodium chloride 0.9 % 100 mL IVPB        2 g 200 mL/hr over 30 Minutes Intravenous Every 24 hours 04/06/23 1224     04/05/23 2200  rifaximin (XIFAXAN) tablet 550 mg       Note to Pharmacy: Takes with lactulose     550 mg Oral 2 times daily 04/05/23 1800     04/05/23 2200  vancomycin (VANCOCIN) IVPB 1000 mg/200 mL premix       See Hyperspace for full Linked Orders Report.   1,000 mg 200 mL/hr over 60 Minutes Intravenous  Once 04/05/23 2054 04/06/23 0104   04/05/23 2100  vancomycin (VANCOREADY) IVPB 1500 mg/300 mL  See Hyperspace for full Linked Orders Report.   1,500 mg 150 mL/hr over 120 Minutes Intravenous  Once 04/05/23 2054 04/06/23 0456   04/05/23 2100  ceFEPIme (MAXIPIME) 2 g in sodium chloride 0.9 % 100 mL IVPB  Status:  Discontinued        2 g 200 mL/hr over 30 Minutes Intravenous Every 8 hours 04/05/23 2054 04/06/23 1224           Data Reviewed:  I have personally reviewed the following...  CBC: Recent Labs  Lab 04/05/23 1642 04/06/23 0518 04/07/23 0530 04/09/23 0545  WBC 3.8* 5.5 5.1 4.8  NEUTROABS 2.9  --   --   --   HGB 13.1 12.4* 11.9* 11.7*  HCT 37.4* 35.4* 34.4* 33.0*  MCV 93.0 93.2 93.5 92.2  PLT 37* 31* 32* 46*    Basic Metabolic Panel: Recent Labs  Lab 04/05/23 1642 04/06/23 0518 04/07/23 0530 04/09/23 0545  NA 137 134* 138 135  K 3.8 3.8 3.7 3.4*  CL 107 106 109 104  CO2 22 20* 23 24  GLUCOSE 122* 145* 125* 96  BUN 14 17 16 16   CREATININE 1.14 1.06 0.99 0.94  CALCIUM 9.1 8.6* 8.4* 8.3*  MG 1.6* 1.8  --   --     GFR: Estimated Creatinine Clearance: 83.5 mL/min (by C-G formula based on SCr of 0.94 mg/dL). Liver Function Tests: Recent Labs  Lab 04/05/23 1642 04/07/23 0530  AST 53* 71*  ALT 33 29  ALKPHOS 95 71  BILITOT 2.8* 2.5*  PROT 6.6 5.7*  ALBUMIN 3.2* 2.7*    No results for input(s): "LIPASE", "AMYLASE" in the last 168 hours. Recent Labs  Lab 04/05/23 1642  04/06/23 0947  AMMONIA 71* 33    Coagulation Profile: No results for input(s): "INR", "PROTIME" in the last 168 hours. Cardiac Enzymes: Recent Labs  Lab 04/05/23 1642  CKTOTAL 114    BNP (last 3 results) No results for input(s): "PROBNP" in the last 8760 hours. HbA1C: No results for input(s): "HGBA1C" in the last 72 hours. CBG: Recent Labs  Lab 04/08/23 1305 04/08/23 1609 04/08/23 2050 04/09/23 0718 04/09/23 1112  GLUCAP 119* 152* 144* 95 86    Lipid Profile: No results for input(s): "CHOL", "HDL", "LDLCALC", "TRIG", "CHOLHDL", "LDLDIRECT" in the last 72 hours. Thyroid Function Tests: No results for input(s): "TSH", "T4TOTAL", "FREET4", "T3FREE", "THYROIDAB" in the last 72 hours. Anemia Panel: No results for input(s): "VITAMINB12", "FOLATE", "FERRITIN", "TIBC", "IRON", "RETICCTPCT" in the last 72 hours. Most Recent Urinalysis On File:     Component Value Date/Time   COLORURINE YELLOW (A) 04/06/2023 0217   APPEARANCEUR CLEAR (A) 04/06/2023 0217   APPEARANCEUR Hazy 05/01/2014 1653   LABSPEC >1.046 (H) 04/06/2023 0217   LABSPEC 1.015 05/01/2014 1653   PHURINE 5.0 04/06/2023 0217   GLUCOSEU NEGATIVE 04/06/2023 0217   GLUCOSEU Negative 05/01/2014 1653   HGBUR SMALL (A) 04/06/2023 0217   BILIRUBINUR NEGATIVE 04/06/2023 0217   BILIRUBINUR Negative 05/01/2014 1653   KETONESUR NEGATIVE 04/06/2023 0217   PROTEINUR 30 (A) 04/06/2023 0217   NITRITE NEGATIVE 04/06/2023 0217   LEUKOCYTESUR NEGATIVE 04/06/2023 0217   LEUKOCYTESUR Negative 05/01/2014 1653   Sepsis Labs: @LABRCNTIP (procalcitonin:4,lacticidven:4) Microbiology: Recent Results (from the past 240 hour(s))  Resp panel by RT-PCR (RSV, Flu A&B, Covid)     Status: None   Collection Time: 04/05/23  9:24 PM   Specimen: Nasal Swab  Result Value Ref Range Status   SARS Coronavirus 2 by RT PCR NEGATIVE NEGATIVE Final  Comment: (NOTE) SARS-CoV-2 target nucleic acids are NOT DETECTED.  The SARS-CoV-2 RNA is  generally detectable in upper respiratory specimens during the acute phase of infection. The lowest concentration of SARS-CoV-2 viral copies this assay can detect is 138 copies/mL. A negative result does not preclude SARS-Cov-2 infection and should not be used as the sole basis for treatment or other patient management decisions. A negative result may occur with  improper specimen collection/handling, submission of specimen other than nasopharyngeal swab, presence of viral mutation(s) within the areas targeted by this assay, and inadequate number of viral copies(<138 copies/mL). A negative result must be combined with clinical observations, patient history, and epidemiological information. The expected result is Negative.  Fact Sheet for Patients:  BloggerCourse.com  Fact Sheet for Healthcare Providers:  SeriousBroker.it  This test is no t yet approved or cleared by the [place] FDA and  has been authorized for detection and/or diagnosis of SARS-CoV-2 by FDA under an Emergency Use Authorization (EUA). This EUA will remain  in effect (meaning this test can be used) for the duration of the COVID-19 declaration under Section 564(b)(1) of the Act, 21 U.S.C.section 360bbb-3(b)(1), unless the authorization is terminated  or revoked sooner.       Influenza A by PCR NEGATIVE NEGATIVE Final   Influenza B by PCR NEGATIVE NEGATIVE Final    Comment: (NOTE) The Xpert Xpress SARS-CoV-2/FLU/RSV plus assay is intended as an aid in the diagnosis of influenza from Nasopharyngeal swab specimens and should not be used as a sole basis for treatment. Nasal washings and aspirates are unacceptable for Xpert Xpress SARS-CoV-2/FLU/RSV testing.  Fact Sheet for Patients: BloggerCourse.com  Fact Sheet for Healthcare Providers: SeriousBroker.it  This test is not yet approved or cleared by the [place] FDA and has been authorized for detection and/or diagnosis of SARS-CoV-2 by FDA under an Emergency Use Authorization (EUA). This EUA will remain in effect (meaning this test can be used) for the duration of the COVID-19 declaration under Section 564(b)(1) of the Act, 21 U.S.C. section 360bbb-3(b)(1), unless the authorization is terminated or revoked.     Resp Syncytial Virus by PCR NEGATIVE NEGATIVE Final    Comment: (NOTE) Fact Sheet for Patients: BloggerCourse.com  Fact Sheet for Healthcare Providers: SeriousBroker.it  This test is not yet approved or cleared by the [place] FDA and has been authorized for detection and/or diagnosis of SARS-CoV-2 by FDA under an Emergency Use Authorization (EUA). This EUA will remain in effect (meaning this test can be used) for the duration of the COVID-19 declaration under Section 564(b)(1) of the Act, 21 U.S.C. section 360bbb-3(b)(1), unless the authorization is terminated or revoked.  Performed at [facility], [address] [place], [place] [address]   Culture, blood (Routine X 2) w Reflex to ID Panel     Status: Abnormal   Collection Time: 04/05/23  9:24 PM   Specimen: BLOOD RIGHT ARM  Result Value Ref Range Status   Specimen Description   Final    BLOOD RIGHT ARM Performed at [facility], [address], [place], [place] [address]    Special Requests   Final    BOTTLES DRAWN AEROBIC AND ANAEROBIC Blood Culture adequate volume Performed at [facility], [address] [place], [place] [address]    Culture  Setup Time   Final    GRAM POSITIVE COCCI IN BOTH AEROBIC AND ANAEROBIC BOTTLES CRITICAL RESULT CALLED TO, READ BACK BY AND VERIFIED WITH: CAROLYN COULTER AT 1211 04/06/23.PMF  REVIEWED BY A. LAFRANCE    Culture (A)  Final    STREPTOCOCCUS ANGINOSIS SUSCEPTIBILITIES PERFORMED ON PREVIOUS CULTURE WITHIN THE LAST 5 DAYS. Performed at  [facility], [address] [place], [place] [address]    Report Status 04/08/2023 FINAL  Final  Culture, blood (Routine X 2) w Reflex to ID Panel     Status: Abnormal   Collection Time: 04/05/23  9:24 PM   Specimen: BLOOD LEFT ARM  Result Value Ref Range Status   Specimen Description BLOOD LEFT ARM  Final   Special Requests   Final    BOTTLES DRAWN AEROBIC AND ANAEROBIC Blood Culture results may not be optimal due to an inadequate volume of blood received in culture bottles   Culture  Setup Time   Final    GRAM POSITIVE COCCI IN BOTH AEROBIC AND ANAEROBIC BOTTLES CRITICAL RESULT CALLED TO, READ BACK BY AND VERIFIED WITH: CAROLYN COULTER AT 1211 04/06/23.PMF  REVIEWED BY A. LAFRANCE    Culture STREPTOCOCCUS ANGINOSIS (A)  Final   Report Status 04/08/2023 FINAL  Final   Organism ID, Bacteria STREPTOCOCCUS ANGINOSIS  Final      Susceptibility   Streptococcus anginosis - MIC*    PENICILLIN <=0.06 SENSITIVE Sensitive     CEFTRIAXONE <=0.12 SENSITIVE Sensitive     ERYTHROMYCIN 1 RESISTANT Resistant     LEVOFLOXACIN 0.5 SENSITIVE Sensitive     VANCOMYCIN 0.5 SENSITIVE Sensitive     * STREPTOCOCCUS ANGINOSIS  Blood Culture ID Panel (Reflexed)     Status: Abnormal   Collection Time: 04/05/23  9:24 PM  Result Value Ref Range Status   Enterococcus faecalis NOT DETECTED NOT DETECTED Final   Enterococcus Faecium NOT DETECTED NOT DETECTED Final   Listeria monocytogenes NOT DETECTED NOT DETECTED Final   Staphylococcus species NOT DETECTED NOT DETECTED Final   Staphylococcus aureus (BCID) NOT DETECTED NOT DETECTED Final   Staphylococcus epidermidis NOT DETECTED NOT DETECTED Final   Staphylococcus lugdunensis NOT DETECTED NOT DETECTED Final   Streptococcus species DETECTED (A) NOT DETECTED Final    Comment: Not Enterococcus species, Streptococcus agalactiae, Streptococcus pyogenes, or Streptococcus pneumoniae. CRITICAL RESULT CALLED TO, READ BACK BY AND VERIFIED WITH: CAROLYN COULTER  AT 1211 04/06/23.PMF    Streptococcus agalactiae NOT DETECTED NOT DETECTED Final   Streptococcus pneumoniae NOT DETECTED NOT DETECTED Final   Streptococcus pyogenes NOT DETECTED NOT DETECTED Final   A.calcoaceticus-baumannii NOT DETECTED NOT DETECTED Final   Bacteroides fragilis NOT DETECTED NOT DETECTED Final   Enterobacterales NOT DETECTED NOT DETECTED Final   Enterobacter cloacae complex NOT DETECTED NOT DETECTED Final   Escherichia coli NOT DETECTED NOT DETECTED Final   Klebsiella aerogenes NOT DETECTED NOT DETECTED Final   Klebsiella oxytoca NOT DETECTED NOT DETECTED Final   Klebsiella pneumoniae NOT DETECTED NOT DETECTED Final   Proteus species NOT DETECTED NOT DETECTED Final   Salmonella species NOT DETECTED NOT DETECTED Final   Serratia marcescens NOT DETECTED NOT DETECTED Final   Haemophilus influenzae NOT DETECTED NOT DETECTED Final   Neisseria meningitidis NOT DETECTED NOT DETECTED Final   Pseudomonas aeruginosa NOT DETECTED NOT DETECTED Final   Stenotrophomonas maltophilia NOT DETECTED NOT DETECTED Final   Candida albicans NOT DETECTED NOT DETECTED Final   Candida auris NOT DETECTED NOT DETECTED Final   Candida glabrata NOT DETECTED NOT DETECTED Final   Candida krusei NOT DETECTED NOT DETECTED Final   Candida parapsilosis NOT DETECTED NOT DETECTED Final   Candida tropicalis NOT DETECTED NOT DETECTED Final  Cryptococcus neoformans/gattii NOT DETECTED NOT DETECTED Final    Comment: Performed at [facility], [address] [place], [place] [address]  Culture, blood (Routine X 2) w Reflex to ID Panel     Status: None (Preliminary result)   Collection Time: 04/07/23 12:41 PM   Specimen: BLOOD  Result Value Ref Range Status   Specimen Description BLOOD LEFT ANTECUBITAL  Final   Special Requests   Final    BOTTLES DRAWN AEROBIC AND ANAEROBIC Blood Culture adequate volume   Culture   Final    NO GROWTH 2 DAYS Performed at [facility], [address], [place], [place] [address]    Report Status PENDING  Incomplete  Culture, blood (Routine X 2) w Reflex to ID Panel     Status: None (Preliminary result)   Collection Time: 04/07/23 12:41 PM   Specimen: BLOOD  Result Value Ref Range Status   Specimen Description BLOOD RIGHT ANTECUBITAL  Final   Special Requests   Final    BOTTLES DRAWN AEROBIC AND ANAEROBIC Blood Culture adequate volume   Culture   Final    NO GROWTH 2 DAYS Performed at [facility], [address], [place], [place] [address]    Report Status PENDING  Incomplete      Radiology Studies last 3 days: ECHO TEE  Result Date: 04/09/2023    TRANSESOPHOGEAL ECHO REPORT   Patient Name:   Joshua Berry Kassner Date of Exam: 04/09/2023 Medical Rec #:  [id number]     Height:       74.0 in Accession #:    [id number]    Weight:       230.2 lb Date of Birth:  [birth year]     BSA:          2.307 m Patient Age:    77 years      BP:           119/54 mmHg Patient Gender: M             HR:           66 bpm. Exam Location:  [facility] Procedure: Transesophageal Echo, Cardiac Doppler and Color Doppler Indications:     Bacteremia R78.81  History:         Patient has prior history of Echocardiogram examinations, most                  recent 04/06/2023. Signs/Symptoms:Murmur; Risk                  Factors:Hypertension and Diabetes. S/P TAVR.                  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  Sonographer:     Cristela Blue Referring Phys:  [id number] BERGE Diagnosing Phys: Debbe Odea MD PROCEDURE: The transesophogeal probe was passed without difficulty through the esophogus of the patient. Sedation performed by performing physician. The patient developed no complications during the procedure.  IMPRESSIONS  1. Left ventricular ejection fraction, by estimation, is 55 to 60%. The left ventricle has normal function.  2. Right ventricular systolic function is normal. The right ventricular size is  normal.  3. No left atrial/left atrial appendage thrombus was detected.  4. The mitral valve is normal in structure. Trivial mitral valve regurgitation.  5. The aortic valve has been repaired/replaced.  Aortic valve regurgitation is mild. 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  6. Aortic dilatation noted. There is mild dilatation of the aortic root, measuring 43 mm. Conclusion(s)/Recommendation(s): No evidence of vegetation/infective endocarditis on this transesophageael echocardiogram. FINDINGS  Left Ventricle: Left ventricular ejection fraction, by estimation, is 55 to 60%. The left ventricle has normal function. The left ventricular internal cavity size was normal in size. Right Ventricle: The right ventricular size is normal. No increase in right ventricular wall thickness. Right ventricular systolic function is normal. Left Atrium: Left atrial size was normal in size. No left atrial/left atrial appendage thrombus was detected. Right Atrium: Right atrial size was normal in size. Pericardium: There is no evidence of pericardial effusion. Mitral Valve: The mitral valve is normal in structure. Trivial mitral valve regurgitation. Tricuspid Valve: The tricuspid valve is normal in structure. Tricuspid valve regurgitation is mild. Aortic Valve: The aortic valve has been repaired/replaced. Aortic valve regurgitation is mild. 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 Pulmonic Valve: The pulmonic valve was normal in structure. Pulmonic valve regurgitation is not visualized. Aorta: Aortic dilatation noted. There is mild dilatation of the aortic root, measuring 43 mm. IAS/Shunts: No atrial level shunt detected by color flow Doppler. Debbe Odea MD Electronically signed by Debbe Odea MD Signature Date/Time: 04/09/2023/1:18:56 PM    Final    MR SHOULDER RIGHT WO CONTRAST  Result Date: 04/09/2023 CLINICAL DATA:  Shoulder pain. Septic arthritis suspected. Osteomyelitis  suspected. EXAM: MRI OF THE RIGHT SHOULDER WITHOUT CONTRAST TECHNIQUE: Multiplanar, multisequence MR imaging of the shoulder was performed. No intravenous contrast was administered. COMPARISON:  Right shoulder MRI 01/02/2019. FINDINGS: Despite efforts by the technologist and patient, mild motion artifact is present on today's exam and could not be eliminated. This reduces exam sensitivity and specificity. Rotator cuff: Progressive infraspinatus tendinosis with interval development of a small intrasubstance insertional tear extending to the bursal surface, best seen on coronal image 10/9. No full-thickness component or tendon retraction identified. There is mild supraspinatus tendinosis. The subscapularis and teres minor tendons appear normal. Muscles:  No focal muscular atrophy or edema. Biceps long head: Mild tendinosis. The tendon is intact and normally located. Acromioclavicular Joint: The acromion is type 2. There are mild acromioclavicular degenerative changes. No significant fluid is present in the subacromial - subdeltoid bursa. Glenohumeral Joint: Chronic advanced glenohumeral arthropathy which has progressed from the previous study. There is diffuse chondral thinning with osteophyte and subchondral cyst formation. Subchondral edema in the glenoid has improved compared with the prior study, and no bone destruction is identified. There is a small shoulder joint effusion with most of the fluid anteriorly in the superior subscapularis recess and the bicipital groove. Labrum: Diffusely degenerated with degenerative tearing superiorly and posteriorly. No significant paralabral cyst. Bones: Chronic glenohumeral arthropathy without findings highly suspicious for septic arthritis or osteomyelitis. Increased subcortical cyst formation in the humeral head near the supraspinatus insertion. Other: No periarticular inflammatory changes or fluid collections are demonstrated to suggest septic arthritis. IMPRESSION: 1.  Chronic advanced glenohumeral arthropathy which has progressed from the prior study of 01/02/2019. No specific evidence of septic arthritis or osteomyelitis. 2. Progressive degenerative tearing of the labrum posteriorly and superiorly. 3. Progressive infraspinatus tendinosis with small intrasubstance insertional tear. No full-thickness rotator cuff tear, tendon retraction or focal muscular atrophy. 4. Mild bicipital tendinosis. Electronically Signed   By: Carey Bullocks M.D.   On: 04/09/2023 08:21   MR THORACIC SPINE WO CONTRAST  Result Date: 04/07/2023  CLINICAL DATA:  Mid back pain. Infection suspected. Abnormal x-ray/CT. Cirrhosis. Altered mental status. EXAM: MRI THORACIC SPINE WITHOUT CONTRAST TECHNIQUE: Multiplanar, multisequence MR imaging of the thoracic spine was performed. No intravenous contrast was administered. COMPARISON:  Chest CT 2 days ago. Lumbar MRI 05/17/2022. Chest CT 04/06/2022 chest CT 05/14/2022. FINDINGS: Alignment:  No significant malalignment. Vertebrae: Old inferior endplate fracture at T5 and superior endplate fracture at T6. Some bone marrow edema in this region that is likely related to subsequent degenerative change. No T2 bright material in the disc space. Old compression fracture at T12 with more recent worsening in the inferior T12 vertebral body portion, also with development of a Schmorl's node at the superior endplate of L1. Similarly, this is favored to be extension of previous fracture more likely than worsening changes related to spinal infection. See below. Cord:  No primary cord lesion.  See below regarding stenosis. Paraspinal and other soft tissues: No paravertebral inflammatory changes are appreciated by MRI. Disc levels: T1-2: Minor disc bulge.  Mild facet degeneration.  No stenosis. T2-3: Moderate disc bulge and facet degeneration. No compressive stenosis. T3-4: Normal interspace. T4-5: Minimal disc bulge.  No stenosis. T5-6: Chronic disc herniation with  calcification. Narrowing of the canal with slight deformity of the cord. Some subarachnoid space does surround the cord. 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 T6-7: Chronic shallow disc protrusion with partial calcification. Narrowing of the ventral subarachnoid space but no compression of the cord. T7-8: Chronic shallow protrusion more prominent towards the left with partial calcification. Narrowing of the ventral subarachnoid space but no compression of the cord. T8-9: Small central disc protrusion but no neural compression. T9-10: Small central to left posterolateral disc protrusion but no neural compression. T10-11: Endplate osteophytes and shallow protrusion of the disc. Facet and ligamentous hypertrophy. Canal narrowing with narrowing of the subarachnoid space surrounding the cord. T11-12: Disc bulge.  No stenosis. T12-L1: Disc bulge.  No stenosis. IMPRESSION: 1. Old inferior endplate fracture at T5 and superior endplate fracture at T6. Some bone marrow edema in this region is likely related to subsequent degenerative change. No T2 bright material in the disc space to suggest the presence of infection. If there are strong clinical indications of infection, 1 could consider a follow-up scan in several weeks. I do not think it is possible to completely exclude infection at this point in time. 2. Old compression fracture at T12 with more recent worsening in the inferior T12 vertebral body portion, also with development of a Schmorl's node at the superior endplate of L1. Similarly, this is favored to be extension of previous fracture more likely than worsening changes related to spinal infection. As above, if concern persists this could be re-evaluated in several weeks. I do not think it is possible to completely exclude infection at this point in time. 3. 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 Narrowing of the ventral subarachnoid space but no compression of the cord.  Electronically Signed   By: Paulina Fusi M.D.   On: 04/07/2023 14:05   ECHOCARDIOGRAM COMPLETE  Result Date: 04/06/2023    ECHOCARDIOGRAM REPORT   Patient Name:   Joshua Berry Harren Date of Exam: 04/06/2023 Medical Rec #:  [id number]     Height:       74.0 in Accession #:    [id number]    Weight:       230.2 lb Date of Birth:  1946/06/20     BSA:  2.307 m Patient Age:    36 years      BP:           119/54 mmHg Patient Gender: M             HR:           67 bpm. Exam Location:  [facility] Procedure: 2D Echo Indications:     Bacteremia R78.81  History:         Patient has prior history of Echocardiogram examinations, most                  recent 05/17/2022.                  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  Sonographer:     Overton Mam RDCS, FASE Referring Phys:  [id number] Sunnie Nielsen Diagnosing Phys: Debbe Odea MD  Sonographer Comments: Image acquisition challenging due to respiratory motion and Image acquisition challenging due to patient body habitus. IMPRESSIONS  1. Left ventricular ejection fraction, by estimation, is 55 to 60%. Left ventricular ejection fraction by PLAX is 59 %. The left ventricle has normal function. The left ventricle has no regional wall motion abnormalities. There is mild left ventricular hypertrophy. Left ventricular diastolic parameters are consistent with Grade I diastolic dysfunction (impaired relaxation).  2. Right ventricular systolic function is normal. The right ventricular size is normal.  3. The mitral valve is degenerative. No evidence of mitral valve regurgitation.  4. The aortic valve has been repaired/replaced. Aortic valve regurgitation is mild. 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 Aortic valve mean gradient measures 11.8 mmHg. FINDINGS  Left Ventricle: Left ventricular ejection fraction, by estimation, is 55 to 60%. Left ventricular ejection fraction by PLAX is 59 %. The left ventricle  has normal function. The left ventricle has no regional wall motion abnormalities. The left ventricular internal cavity size was normal in size. There is mild left ventricular hypertrophy. Left ventricular diastolic parameters are consistent with Grade I diastolic dysfunction (impaired relaxation). Right Ventricle: The right ventricular size is normal. No increase in right ventricular wall thickness. Right ventricular systolic function is normal. Left Atrium: Left atrial size was normal in size. Right Atrium: Right atrial size was normal in size. Pericardium: There is no evidence of pericardial effusion. Mitral Valve: The mitral valve is degenerative in appearance. Mild to moderate mitral annular calcification. No evidence of mitral valve regurgitation. Tricuspid Valve: The tricuspid valve is normal in structure. Tricuspid valve regurgitation is mild. Aortic Valve: The aortic valve has been repaired/replaced. Aortic valve regurgitation is mild. Aortic valve mean gradient measures 11.8 mmHg. Aortic valve peak gradient measures 21.4 mmHg. Aortic valve area, by VTI measures 1.36 cm. 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 Pulmonic Valve: The pulmonic valve was normal in structure. Pulmonic valve regurgitation is mild. Aorta: The aortic root is normal in size and structure. Venous: The inferior vena cava was not well visualized. IAS/Shunts: No atrial level shunt detected by color flow Doppler.  LEFT VENTRICLE PLAX 2D LV EF:         Left            Diastology                ventricular     LV e' medial:    6.09 cm/s  ejection        LV E/e' medial:  17.6                fraction by     LV e' lateral:   10.40 cm/s                PLAX is 59      LV E/e' lateral: 10.3                %. LVIDd:         4.80 cm LVIDs:         3.30 cm LV PW:         1.10 cm LV IVS:        1.30 cm LVOT diam:     2.20 cm LV SV:         67 LV SV Index:   29 LVOT Area:     3.80 cm  RIGHT VENTRICLE RV Basal  diam:  3.90 cm RV S prime:     13.20 cm/s TAPSE (M-mode): 2.1 cm LEFT ATRIUM             Index        RIGHT ATRIUM           Index LA diam:        3.60 cm 1.56 cm/m   RA Area:     13.40 cm LA Vol (A2C):   34.0 ml 14.74 ml/m  RA Volume:   29.50 ml  12.79 ml/m LA Vol (A4C):   31.2 ml 13.52 ml/m LA Biplane Vol: 34.1 ml 14.78 ml/m  AORTIC VALVE                     PULMONIC VALVE AV Area (Vmax):    1.40 cm      PV Vmax:        1.44 m/s AV Area (Vmean):   1.40 cm      PV Peak grad:   8.3 mmHg AV Area (VTI):     1.36 cm      RVOT Peak grad: 4 mmHg AV Vmax:           231.50 cm/s AV Vmean:          157.750 cm/s AV VTI:            0.492 m AV Peak Grad:      21.4 mmHg AV Mean Grad:      11.8 mmHg LVOT Vmax:         85.40 cm/s LVOT Vmean:        58.100 cm/s LVOT VTI:          0.176 m LVOT/AV VTI ratio: 0.36  AORTA Ao Root diam: 3.40 cm MITRAL VALVE MV Area (PHT): 2.57 cm     SHUNTS MV Decel Time: 295 msec     Systemic VTI:  0.18 m MV E velocity: 107.00 cm/s  Systemic Diam: 2.20 cm MV A velocity: 132.00 cm/s MV E/A ratio:  0.81 Debbe Odea MD Electronically signed by Debbe Odea MD Signature Date/Time: 04/06/2023/4:06:54 PM    Final    MR BRAIN WO CONTRAST  Result Date: 04/05/2023 CLINICAL DATA:  Altered mental status EXAM: MRI HEAD WITHOUT CONTRAST TECHNIQUE: Multiplanar, multiecho pulse sequences of the brain and surrounding structures were obtained without intravenous contrast. COMPARISON:  None Available. FINDINGS: Brain: No acute infarct, mass effect or extra-axial collection. No acute or chronic hemorrhage. There is multifocal hyperintense T2-weighted signal  within the white matter. Generalized volume loss. The midline structures are normal. Vascular: Major flow voids are preserved. Skull and upper cervical spine: Normal calvarium and skull base. Visualized upper cervical spine and soft tissues are normal. Sinuses/Orbits:No paranasal sinus fluid levels or advanced mucosal thickening. No mastoid or  middle ear effusion. Normal orbits. IMPRESSION: 1. No acute intracranial abnormality. 2. 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 Electronically Signed   By: Deatra Robinson M.D.   On: 04/05/2023 21:28   CT CHEST ABDOMEN PELVIS W CONTRAST  Result Date: 04/05/2023 CLINICAL DATA:  Sepsis EXAM: CT CHEST, ABDOMEN, AND PELVIS WITH CONTRAST TECHNIQUE: Multidetector CT imaging of the chest, abdomen and pelvis was performed following the standard protocol during bolus administration of intravenous contrast. RADIATION DOSE REDUCTION: This exam was performed according to the departmental dose-optimization program which includes automated exposure control, adjustment of the mA and/or kV according to patient size and/or use of iterative reconstruction technique. CONTRAST:  OMNIPAQUE IOHEXOL 300 MG/ML  SOLN COMPARISON:  Chest x-ray earlier 04/05/2023. CT angiogram chest 03/14/2022. Abdomen pelvis CT 07/20/2019. Multiple other comparison exams FINDINGS: CT CHEST FINDINGS Cardiovascular: Status post TAVR. Heart is nonenlarged. No pericardial effusion. The thoracic aorta overall has a normal course and caliber. Coronary artery calcifications are seen. Mediastinum/Nodes: Mildly patulous esophagus. No specific abnormal lymph node enlargement identified in the axillary region, hilum or mediastinum. Lungs/Pleura: No consolidation, pneumothorax or effusion. Breathing motion seen throughout the examination. There is some dependent atelectasis seen with some also areas of scarring and fibrotic change. Musculoskeletal: Scattered degenerative changes along the spine. There are multiple posterior endplate osteophytes identified with areas of stenosis in the thoracic spine region. There is severe compression of the T12 vertebral level. This was not seen on the prior CT scan. There is some lucencies along the inferior endplate with some air. This could be a benign compression but could be more acute to subacute.  Please correlate with clinical history. Streak artifact related to the patient's left shoulder arthroplasty. Gynecomastia. CT ABDOMEN PELVIS FINDINGS Hepatobiliary: Nodular liver consistent with chronic liver disease. 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 Main portal vein is patent with diameter at the liver hilum approaching 18 mm. Pancreas: Unremarkable. No pancreatic ductal dilatation or surrounding inflammatory changes. Spleen: Small splenule at the hilum. Spleen has a cephalocaudal length approaching 20 cm. Preserved enhancement. Adrenals/Urinary Tract: Adrenal glands are preserved. Stable lower pole left-sided renal cyst with a parapelvic component measuring 4 cm. Bosniak 1 lesion. There are some small exophytic similar cysts on the right side. No specific follow-up. Preserved contours of the urinary bladder. Mild bladder wall thickening. Stomach/Bowel: Scattered colonic stool. There is scattered diffuse colonic diverticula. Bowel is nondilated. Stomach is underdistended. Small bowel is nondilated. Vascular/Lymphatic: Normal caliber aorta and IVC with scattered mild vascular calcifications. No specific abnormal lymph node enlargement seen in the abdomen and pelvis. Reproductive: Enlarged prostate with mass effect along the base of the bladder. Other: Trace ascites in the pelvis. There is motion throughout the examination limiting evaluation particularly of the bowel and mesentery. Musculoskeletal: Advanced degenerative changes along the spine with multilevel stenosis. Again compression of T12. streak artifact as the patient's arms were scanned at the patient's side across the abdomen and pelvis. IMPRESSION: Evidence of chronic liver disease with a nodular heterogeneous liver, splenomegaly. Trace ascites. Patent tips shunt. Colonic diverticulosis. No bowel obstruction or free air. No rim enhancing fluid collections. No consolidation, pneumothorax or effusion. Severe compression of  the  T12 level which was not seen previously. Based on appearance this could be subacute. Please correlate with specific clinical history and symptomatology. Electronically Signed   By: Karen Kays M.D.   On: 04/05/2023 20:36   CT Head Wo Contrast  Result Date: 04/05/2023 CLINICAL DATA:  Mental status change, unknown cause EXAM: CT HEAD WITHOUT CONTRAST TECHNIQUE: Contiguous axial images were obtained from the base of the skull through the vertex without intravenous contrast. RADIATION DOSE REDUCTION: This exam was performed according to the departmental dose-optimization program which includes automated exposure control, adjustment of the mA and/or kV according to patient size and/or use of iterative reconstruction technique. COMPARISON:  08/02/2022 FINDINGS: Brain: No evidence of acute infarction, hemorrhage, hydrocephalus, extra-axial collection or mass lesion/mass effect. Patchy low-density changes within the periventricular and subcortical white matter most compatible with chronic microvascular ischemic change. Mild diffuse cerebral volume loss. Vascular: No hyperdense vessel or unexpected calcification. Skull: Normal. Negative for fracture or focal lesion. Sinuses/Orbits: No acute finding. Other: None. IMPRESSION: 1. No acute intracranial abnormality. 2. Chronic microvascular ischemic change and cerebral volume loss. Electronically Signed   By: Duanne Guess D.O.   On: 04/05/2023 17:13   DG Chest Port 1 View  Result Date: 04/05/2023 CLINICAL DATA:  Altered mental status EXAM: PORTABLE CHEST 1 VIEW COMPARISON:  CXR 07/31/22 FINDINGS: Status post TAVR. Possible trace left pleural effusion. No pneumothorax unchanged cardiac and mediastinal contours. No focal airspace opacity. There are prominent interstitial opacities, unchanged from prior exam. No radiographically apparent displaced rib fractures. Visualized upper abdomen is unremarkable. Surgical clips in the right upper quadrant. IMPRESSION: No focal  airspace opacity. Electronically Signed   By: Lorenza Cambridge M.D.   On: 04/05/2023 16:52             LOS: 4 days       Silvano Bilis, MD [facility] 04/09/2023, 1:55 PM     Staff may message me via secure chat in Epic  but this may not receive an immediate response,  please page me for urgent matters!  If 7PM-7AM, please contact night coverage [email or web address]

## 2023-04-09 NOTE — Progress Notes (Signed)
Phone consent for TEE received from Wife Raysean Graumann [phone] witnessed by Lequita Halt RN

## 2023-04-09 NOTE — Progress Notes (Signed)
*  PRELIMINARY RESULTS* Echocardiogram Echocardiogram Transesophageal has been performed.  Cristela Blue 04/09/2023, 8:33 AM

## 2023-04-09 NOTE — Progress Notes (Signed)
PT Cancellation Note  Patient Details Name: Joshua Berry MRN: [id number] DOB: Aug 17, 1946   Cancelled Treatment:     PT attempt. Pt sound asleep upon arrival. He does awake but is unable to stay awake long enough to participate. Acute PT will continue to follow and return when pt is more appropriate to participate.    [name] 04/09/2023, 2:24 PM

## 2023-04-09 NOTE — Progress Notes (Signed)
Occupational Therapy Treatment Patient Details Name: Joshua Berry MRN: [id number] DOB: 12-27-1945 Today's Date: 04/09/2023   History of present illness Patient is a 77 year old male with chief complaint of AMS, found to be septic with infection source unclear. History of [name], liver cirrhosis, hypertension, iron deficiency anemia, chronic thrombocytopenia   OT comments  Patient received supine in bed and asleep. Max multimodal cues required for alertness t/o session. Pt reported it was currently "morning time". Pt brought into chair position in bed to engage in grooming tasks and BUE AROM exercises. Pt participation limited this date due to lethargy/fatigue. Pt left sitting up in bed with lights on, blinds opened, and TV turned on to improve alertness/orientation. D/C recommendation remains appropriate. OT will continue to follow acutely.     Recommendations for follow up therapy are one component of a multi-disciplinary discharge planning process, led by the attending physician.  Recommendations may be updated based on patient status, additional functional criteria and insurance authorization.    Assistance Recommended at Discharge Frequent or constant Supervision/Assistance  Patient can return home with the following  A lot of help with walking and/or transfers;Assistance with cooking/housework;Assist for transportation;Help with stairs or ramp for entrance;Direct supervision/assist for medications management;A lot of help with bathing/dressing/bathroom   Equipment Recommendations  Other (comment) (defer to next venue of care)    Recommendations for Other Services      Precautions / Restrictions Precautions Precautions: Fall Restrictions Weight Bearing Restrictions: No       Mobility Bed Mobility Overal bed mobility: Needs Assistance             General bed mobility comments: deferred 2/2 decreased alertness and command following    Transfers Overall transfer level:  Needs assistance         Balance Overall balance assessment: Needs assistance     Sitting balance - Comments: deferred 2/2 decreased alertness and command following     ADL either performed or assessed with clinical judgement   ADL Overall ADL's : Needs assistance/impaired     Grooming: Bed level;Set up;Cueing for sequencing;Supervision/safety        Extremity/Trunk Assessment Upper Extremity Assessment Upper Extremity Assessment: Generalized weakness   Lower Extremity Assessment Lower Extremity Assessment: Generalized weakness        Vision Baseline Vision/History: 1 Wears glasses Patient Visual Report: No change from baseline     Perception     Praxis      Cognition Arousal/Alertness: Lethargic Behavior During Therapy: Flat affect Overall Cognitive Status: History of cognitive impairments - at baseline       General Comments: Max mulitmodal cues required t/o for alertness. Pt reported it was currently "morning time". Interventions put in place to improve alertness/orientation (blinds opened, lights and TV turned on).        Exercises Other Exercises Other Exercises: OT attempting to instruct pt in bed level BUE AROM exercises. Pt completed L shoulder flexion AROM with Min VC, assistance required to initiate movement of RUE (shoulder pain with movement, completed AAROM shoulder flexion to ~90 deg).    Shoulder Instructions       General Comments      Pertinent Vitals/ Pain       Pain Assessment Pain Assessment: Faces Faces Pain Scale: Hurts a little bit Pain Location: R shoulder with movement Pain Descriptors / Indicators: Grimacing, Aching, Sore Pain Intervention(s): Limited activity within patient's tolerance, Monitored during session, Repositioned  Home Living      Prior Functioning/Environment  Frequency  Min 1X/week        Progress Toward Goals  OT Goals(current goals can now be found in the care plan section)   Progress towards OT goals: Progressing toward goals  Acute Rehab OT Goals Patient Stated Goal: return home OT Goal Formulation: With patient Time For Goal Achievement: 04/21/23 Potential to Achieve Goals: Fair  Plan Discharge plan remains appropriate;Frequency remains appropriate    Co-evaluation                 AM-PAC OT "6 Clicks" Daily Activity     Outcome Measure   Help from another person eating meals?: None Help from another person taking care of personal grooming?: A Little Help from another person toileting, which includes using toliet, bedpan, or urinal?: A Lot Help from another person bathing (including washing, rinsing, drying)?: A Lot Help from another person to put on and taking off regular upper body clothing?: A Little Help from another person to put on and taking off regular lower body clothing?: A Lot 6 Click Score: 16    End of Session    OT Visit Diagnosis: Unsteadiness on feet (R26.81);Muscle weakness (generalized) (M62.81);Pain   Activity Tolerance Patient limited by lethargy   Patient Left in bed;with call bell/phone within reach;with bed alarm set   Nurse Communication Mobility status        Time: 1610-9604 OT Time Calculation (min): 11 min  Charges: OT General Charges $OT Visit: 1 Visit OT Treatments $Self Care/Home Management : 8-22 mins  [facility] MS, OTR/L ascom [phone]  04/09/23, 6:32 PM

## 2023-04-10 DIAGNOSIS — R7881 Bacteremia: Secondary | ICD-10-CM | POA: Diagnosis not present

## 2023-04-10 DIAGNOSIS — B955 Unspecified streptococcus as the cause of diseases classified elsewhere: Secondary | ICD-10-CM | POA: Diagnosis not present

## 2023-04-10 LAB — CBC
HCT: 34.5 % — ABNORMAL LOW (ref 39.0–52.0)
Hemoglobin: 12.1 g/dL — ABNORMAL LOW (ref 13.0–17.0)
MCH: 32.6 pg (ref 26.0–34.0)
MCHC: 35.1 g/dL (ref 30.0–36.0)
MCV: 93 fL (ref 80.0–100.0)
Platelets: 50 10*3/uL — ABNORMAL LOW (ref 150–400)
RBC: 3.71 MIL/uL — ABNORMAL LOW (ref 4.22–5.81)
RDW: 15.5 % (ref 11.5–15.5)
WBC: 4.4 10*3/uL (ref 4.0–10.5)
nRBC: 0 % (ref 0.0–0.2)

## 2023-04-10 LAB — GLUCOSE, CAPILLARY
Glucose-Capillary: 111 mg/dL — ABNORMAL HIGH (ref 70–99)
Glucose-Capillary: 103 mg/dL — ABNORMAL HIGH (ref 70–99)
Glucose-Capillary: 137 mg/dL — ABNORMAL HIGH (ref 70–99)
Glucose-Capillary: 151 mg/dL — ABNORMAL HIGH (ref 70–99)
Glucose-Capillary: 84 mg/dL (ref 70–99)

## 2023-04-10 LAB — CULTURE, BLOOD (ROUTINE X 2)

## 2023-04-10 MED ORDER — AMOXICILLIN 500 MG PO CAPS
1000.0000 mg | ORAL_CAPSULE | Freq: Three times a day (TID) | ORAL | Status: DC
  Administered 2023-04-10 – 2023-04-17 (×20): 1000 mg via ORAL
  Filled 2023-04-10 (×22): qty 2

## 2023-04-10 MED ORDER — INSULIN ASPART 100 UNIT/ML IJ SOLN
3.0000 [IU] | Freq: Three times a day (TID) | INTRAMUSCULAR | Status: DC
  Administered 2023-04-10 – 2023-04-17 (×19): 3 [IU] via SUBCUTANEOUS
  Filled 2023-04-10 (×20): qty 1

## 2023-04-10 MED ORDER — LACTULOSE 10 GM/15ML PO SOLN
30.0000 g | Freq: Three times a day (TID) | ORAL | Status: DC
  Administered 2023-04-10 – 2023-04-17 (×20): 30 g via ORAL
  Filled 2023-04-10 (×20): qty 60

## 2023-04-10 NOTE — Progress Notes (Signed)
PROGRESS NOTE    Joshua URBAS   UJW:[id number] DOB: 07-Nov-1946  DOA: 04/05/2023 Date of Service: 04/10/23 PCP: [facility]     Brief Narrative / Hospital Course:  Joshua Berry is a 77 year old male with history of NASH liver cirrhosis, hypertension, iron deficiency anemia, chronic thrombocytopenia, who presents from [facility] assisted living via EMS on 04/05/23 to emergency department for chief concerns of altered mental status. According to his wife he became progressively altered throughout the day and afternoon yesterday. No recent illnesses, trauma, other acute medical complaints. Last night given his altered mental status did not take his lactulose.  04/20: disoriented to situation but oriented to self. (+)fever, tachypnea, low WBC 3.8 = SIRS/sepsis. Ammonia elevated to 71. Lactic 2.1 --> 1.7. COVID/flu/RSV neg. UA neg for UTI. CXR non-acute. CT head non-acute, (+)chronic microvascular ischemic change and cerebral volume loss. CT C/A/P: liver disease, patent TIPS shunt, severe T12 compression question subacute, no noted infection. Admitted for concern for sepsis / hepatic encephalopathy  04/21: MRI brain non-acute. Ammonia down to 33. BCx (+)strep, change abx to ceftriaxone, Echo ordered, will repeat BCx and ask ID to see tomorrow. PT/OT ordered.  04/22: stable. Repeat BCx. MRI to eval for osteo Tspine given compression fx --> no osteo noted. TTE no concerns. ID saw patient - will need 4 weeks IV abx.  04/23: Cardiology consulted for TEE.  4/24: TEE and mri of right shoulder negative, CT maxillofacial extensive caries, no abscess       Consultants:  Infectious Disease Cardiology    Procedures: none           ASSESSMENT & PLAN:   Principal Problem:   Severe sepsis Active Problems:   Acute hepatic encephalopathy   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   Chronic diastolic CHF (congestive heart failure)   Hypothyroidism    Overweight (BMI 25.0-29.9)   Secondary esophageal varices without bleeding   HLD (hyperlipidemia)   HTN (hypertension)   Physical deconditioning   Hypomagnesemia   Hepatic encephalopathy      Sepsis w/ positive BCx x4 strep, source likely dental infection  Blood cultures (+)strep  Cefepime and vancomycin per pharmacy --> ceftriaxone, ID advising transition to oral amoxicillin through 5/6, orders are in following culture results, most recent cultures from 4/22 are ngtd ID following  TEE negative Mri right shoulder negative  ct of maxillofacial no abscess, has extensive caries   Compression fracture T-spine MRI showed no infectious process in spine Pain control   R shoulder pain Mri shows degenerative changes   Altered mental status, improving  Likely hepatic encephalopathy + sepsis  MRI of the brain without contrast no concerns. One bm last 24 - will increase lactulose to tid, daughter says that's his regular dose    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 twice daily lactulose 30 g p.o. twice daily   Thrombocytopenia D/t liver disease Has been unable to get dental extractions d/t this per daughter  Monitor Plt, stable at around 50   Hypothyroidism Levothyroxine 100 mcg daily    HTN (hypertension) Atorvastatin 20 mg nightly    IDDM Type 2 SSI ac Basal insulin      DVT prophylaxis: TED hose, no Rx ppx d/t chronic thrombocytopenia  Pertinent IV fluids/nutrition: no continuous IV fluids; regular diet  Central lines / invasive devices: none   Code Status: DNR - DNR, admitting hospitalist Dr Sedalia Muta noted she confirmed this with daughter at bedside 04/20. Daughter states  patient has a MOST form  ACP documentation reviewed: 04/06/23, 8:01 AM: none on file    Current Admission Status: inpatient  TOC needs / Dispo plan:  snf Barriers to discharge / significant pending items: snf placement   Family  communication: daughter updated telephonically 4/25    Subjective / Brief ROS:  Patient confused, no complaints    Family Communication: daughter at bedside 4/24     Objective Findings:  Vitals:   04/09/23 1950 04/10/23 0520 04/10/23 0724 04/10/23 1534  BP: 107/62 124/65 128/63 120/63  Pulse: 63 62 62 65  Resp: Temp: 99 F (37.2 C) 99.4 F (37.4 C) 98.3 F (36.8 C) 99.8 F (37.7 C)  TempSrc:      SpO2: 100% 96% 97% 100%  Weight:      Height:        Intake/Output Summary (Last 24 hours) at 04/10/2023 1617 Last data filed at 04/10/2023 0600 Gross per 24 hour  Intake 200 ml  Output 1400 ml  Net -1200 ml   Filed Weights   04/05/23 1628  Weight: 104.4 kg    Examination:  NAD Confused CTAB RRR Ext warm, no edema       Scheduled Medications:   acidophilus  1 capsule Oral Daily   ascorbic acid  1,000 mg Oral Daily   atorvastatin  20 mg Oral QHS   famotidine  20 mg Oral Daily   folic acid  1 mg Oral Daily   insulin aspart  0-9 Units Subcutaneous TID WC   insulin aspart  6 Units Subcutaneous TID WC   insulin glargine-yfgn  10 Units Subcutaneous QHS   lactulose  30 g Oral BID   levothyroxine  100 mcg Oral Q0600   magnesium oxide  800 mg Oral BID   melatonin  10 mg Oral QHS   pantoprazole  40 mg Oral BID   rifaximin  550 mg Oral BID    Continuous Infusions:  cefTRIAXone (ROCEPHIN)  IV 2 g (04/10/23 1452)    PRN Medications:  acetaminophen **OR** acetaminophen, ondansetron **OR** ondansetron (ZOFRAN) IV, oxyCODONE, senna-docusate, trolamine salicylate  Antimicrobials from admission:  Anti-infectives (From admission, onward)    Start     Dose/Rate Route Frequency Ordered Stop   04/06/23 2200  vancomycin (VANCOREADY) IVPB 2000 mg/400 mL  Status:  Discontinued        2,000 mg 200 mL/hr over 120 Minutes Intravenous Every 24 hours 04/05/23 2054 04/06/23 1224   04/06/23 1400  cefTRIAXone (ROCEPHIN) 2 g in sodium chloride 0.9 % 100 mL IVPB         2 g 200 mL/hr over 30 Minutes Intravenous Every 24 hours 04/06/23 1224     04/05/23 2200  rifaximin (XIFAXAN) tablet 550 mg       Note to Pharmacy: Takes with lactulose     550 mg Oral 2 times daily 04/05/23 1800     04/05/23 2200  vancomycin (VANCOCIN) IVPB 1000 mg/200 mL premix       See Hyperspace for full Linked Orders Report.   1,000 mg 200 mL/hr over 60 Minutes Intravenous  Once 04/05/23 2054 04/06/23 0104   04/05/23 2100  vancomycin (VANCOREADY) IVPB 1500 mg/300 mL       See Hyperspace for full Linked Orders Report.   1,500 mg 150 mL/hr over 120 Minutes Intravenous  Once 04/05/23 2054 04/06/23 0456   04/05/23 2100  ceFEPIme (MAXIPIME) 2 g in sodium chloride 0.9 % 100 mL IVPB  Status:  Discontinued        2 g 200 mL/hr over 30 Minutes Intravenous Every 8 hours 04/05/23 2054 04/06/23 1224           Data Reviewed:  I have personally reviewed the following...  CBC: Recent Labs  Lab 04/05/23 1642 04/06/23 0518 04/07/23 0530 04/09/23 0545 04/10/23 0531  WBC 3.8* 5.5 5.1 4.8 4.4  NEUTROABS 2.9  --   --   --   --   HGB 13.1 12.4* 11.9* 11.7* 12.1*  HCT 37.4* 35.4* 34.4* 33.0* 34.5*  MCV 93.0 93.2 93.5 92.2 93.0  PLT 37* 31* 32* 46* 50*   Basic Metabolic Panel: Recent Labs  Lab 04/05/23 1642 04/06/23 0518 04/07/23 0530 04/09/23 0545  NA 137 134* 138 135  K 3.8 3.8 3.7 3.4*  CL 107 106 109 104  CO2 22 20* 23 24  GLUCOSE 122* 145* 125* 96  BUN 14 17 16 16   CREATININE 1.14 1.06 0.99 0.94  CALCIUM 9.1 8.6* 8.4* 8.3*  MG 1.6* 1.8  --   --    GFR: Estimated Creatinine Clearance: 83.5 mL/min (by C-G formula based on SCr of 0.94 mg/dL). Liver Function Tests: Recent Labs  Lab 04/05/23 1642 04/07/23 0530  AST 53* 71*  ALT 33 29  ALKPHOS 95 71  BILITOT 2.8* 2.5*  PROT 6.6 5.7*  ALBUMIN 3.2* 2.7*   No results for input(s): "LIPASE", "AMYLASE" in the last 168 hours. Recent Labs  Lab 04/05/23 1642 04/06/23 0947  AMMONIA 71* 33   Coagulation  Profile: No results for input(s): "INR", "PROTIME" in the last 168 hours. Cardiac Enzymes: Recent Labs  Lab 04/05/23 1642  CKTOTAL 114   BNP (last 3 results) No results for input(s): "PROBNP" in the last 8760 hours. HbA1C: No results for input(s): "HGBA1C" in the last 72 hours. CBG: Recent Labs  Lab 04/09/23 1952 04/10/23 0727 04/10/23 0949 04/10/23 1148 04/10/23 1536  GLUCAP 120* 111* 103* 137* 151*   Lipid Profile: No results for input(s): "CHOL", "HDL", "LDLCALC", "TRIG", "CHOLHDL", "LDLDIRECT" in the last 72 hours. Thyroid Function Tests: No results for input(s): "TSH", "T4TOTAL", "FREET4", "T3FREE", "THYROIDAB" in the last 72 hours. Anemia Panel: No results for input(s): "VITAMINB12", "FOLATE", "FERRITIN", "TIBC", "IRON", "RETICCTPCT" in the last 72 hours. Most Recent Urinalysis On File:     Component Value Date/Time   COLORURINE YELLOW (A) 04/06/2023 0217   APPEARANCEUR CLEAR (A) 04/06/2023 0217   APPEARANCEUR Hazy 05/01/2014 1653   LABSPEC >1.046 (H) 04/06/2023 0217   LABSPEC 1.015 05/01/2014 1653   PHURINE 5.0 04/06/2023 0217   GLUCOSEU NEGATIVE 04/06/2023 0217   GLUCOSEU Negative 05/01/2014 1653   HGBUR SMALL (A) 04/06/2023 0217   BILIRUBINUR NEGATIVE 04/06/2023 0217   BILIRUBINUR Negative 05/01/2014 1653   KETONESUR NEGATIVE 04/06/2023 0217   PROTEINUR 30 (A) 04/06/2023 0217   NITRITE NEGATIVE 04/06/2023 0217   LEUKOCYTESUR NEGATIVE 04/06/2023 0217   LEUKOCYTESUR Negative 05/01/2014 1653   Sepsis Labs: @LABRCNTIP (procalcitonin:4,lacticidven:4) Microbiology: Recent Results (from the past 240 hour(s))  Resp panel by RT-PCR (RSV, Flu A&B, Covid)     Status: None   Collection Time: 04/05/23  9:24 PM   Specimen: Nasal Swab  Result Value Ref Range Status   SARS Coronavirus 2 by RT PCR NEGATIVE NEGATIVE Final    Comment: (NOTE) SARS-CoV-2 target nucleic acids are NOT DETECTED.  The SARS-CoV-2 RNA is generally detectable in upper respiratory specimens  during the acute phase of infection. The lowest concentration of SARS-CoV-2 viral copies this assay can  detect is 138 copies/mL. A negative result does not preclude SARS-Cov-2 infection and should not be used as the sole basis for treatment or other patient management decisions. A negative result may occur with  improper specimen collection/handling, submission of specimen other than nasopharyngeal swab, presence of viral mutation(s) within the areas targeted by this assay, and inadequate number of viral copies(<138 copies/mL). A negative result must be combined with clinical observations, patient history, and epidemiological information. The expected result is Negative.  Fact Sheet for Patients:  BloggerCourse.com  Fact Sheet for Healthcare Providers:  SeriousBroker.it  This test is no t yet approved or cleared by the [place] FDA and  has been authorized for detection and/or diagnosis of SARS-CoV-2 by FDA under an Emergency Use Authorization (EUA). This EUA will remain  in effect (meaning this test can be used) for the duration of the COVID-19 declaration under Section 564(b)(1) of the Act, 21 U.S.C.section 360bbb-3(b)(1), unless the authorization is terminated  or revoked sooner.       Influenza A by PCR NEGATIVE NEGATIVE Final   Influenza B by PCR NEGATIVE NEGATIVE Final    Comment: (NOTE) The Xpert Xpress SARS-CoV-2/FLU/RSV plus assay is intended as an aid in the diagnosis of influenza from Nasopharyngeal swab specimens and should not be used as a sole basis for treatment. Nasal washings and aspirates are unacceptable for Xpert Xpress SARS-CoV-2/FLU/RSV testing.  Fact Sheet for Patients: BloggerCourse.com  Fact Sheet for Healthcare Providers: SeriousBroker.it  This test is not yet approved or cleared by the [place] FDA and has been authorized for detection  and/or diagnosis of SARS-CoV-2 by FDA under an Emergency Use Authorization (EUA). This EUA will remain in effect (meaning this test can be used) for the duration of the COVID-19 declaration under Section 564(b)(1) of the Act, 21 U.S.C. section 360bbb-3(b)(1), unless the authorization is terminated or revoked.     Resp Syncytial Virus by PCR NEGATIVE NEGATIVE Final    Comment: (NOTE) Fact Sheet for Patients: BloggerCourse.com  Fact Sheet for Healthcare Providers: SeriousBroker.it  This test is not yet approved or cleared by the [place] FDA and has been authorized for detection and/or diagnosis of SARS-CoV-2 by FDA under an Emergency Use Authorization (EUA). This EUA will remain in effect (meaning this test can be used) for the duration of the COVID-19 declaration under Section 564(b)(1) of the Act, 21 U.S.C. section 360bbb-3(b)(1), unless the authorization is terminated or revoked.  Performed at [facility], [address] [place], [place] [address]   Culture, blood (Routine X 2) w Reflex to ID Panel     Status: Abnormal   Collection Time: 04/05/23  9:24 PM   Specimen: BLOOD RIGHT ARM  Result Value Ref Range Status   Specimen Description   Final    BLOOD RIGHT ARM Performed at [facility], [address], [place], [place] [address]    Special Requests   Final    BOTTLES DRAWN AEROBIC AND ANAEROBIC Blood Culture adequate volume Performed at [facility], [address] [place], [place] [address]    Culture  Setup Time   Final    GRAM POSITIVE COCCI IN BOTH AEROBIC AND ANAEROBIC BOTTLES CRITICAL RESULT CALLED TO, READ BACK BY AND VERIFIED WITH: CAROLYN COULTER AT 1211 04/06/23.PMF  REVIEWED BY A. LAFRANCE    Culture (A)  Final    STREPTOCOCCUS ANGINOSIS SUSCEPTIBILITIES PERFORMED ON PREVIOUS CULTURE WITHIN THE LAST 5 DAYS. Performed at [facility], [address],  [place],  [place] [address]    Report Status 04/08/2023 FINAL  Final  Culture, blood (Routine X 2) w Reflex to ID Panel     Status: Abnormal   Collection Time: 04/05/23  9:24 PM   Specimen: BLOOD LEFT ARM  Result Value Ref Range Status   Specimen Description BLOOD LEFT ARM  Final   Special Requests   Final    BOTTLES DRAWN AEROBIC AND ANAEROBIC Blood Culture results may not be optimal due to an inadequate volume of blood received in culture bottles   Culture  Setup Time   Final    GRAM POSITIVE COCCI IN BOTH AEROBIC AND ANAEROBIC BOTTLES CRITICAL RESULT CALLED TO, READ BACK BY AND VERIFIED WITH: CAROLYN COULTER AT 1211 04/06/23.PMF  REVIEWED BY A. LAFRANCE    Culture STREPTOCOCCUS ANGINOSIS (A)  Final   Report Status 04/08/2023 FINAL  Final   Organism ID, Bacteria STREPTOCOCCUS ANGINOSIS  Final      Susceptibility   Streptococcus anginosis - MIC*    PENICILLIN <=0.06 SENSITIVE Sensitive     CEFTRIAXONE <=0.12 SENSITIVE Sensitive     ERYTHROMYCIN 1 RESISTANT Resistant     LEVOFLOXACIN 0.5 SENSITIVE Sensitive     VANCOMYCIN 0.5 SENSITIVE Sensitive     * STREPTOCOCCUS ANGINOSIS  Blood Culture ID Panel (Reflexed)     Status: Abnormal   Collection Time: 04/05/23  9:24 PM  Result Value Ref Range Status   Enterococcus faecalis NOT DETECTED NOT DETECTED Final   Enterococcus Faecium NOT DETECTED NOT DETECTED Final   Listeria monocytogenes NOT DETECTED NOT DETECTED Final   Staphylococcus species NOT DETECTED NOT DETECTED Final   Staphylococcus aureus (BCID) NOT DETECTED NOT DETECTED Final   Staphylococcus epidermidis NOT DETECTED NOT DETECTED Final   Staphylococcus lugdunensis NOT DETECTED NOT DETECTED Final   Streptococcus species DETECTED (A) NOT DETECTED Final    Comment: Not Enterococcus species, Streptococcus agalactiae, Streptococcus pyogenes, or Streptococcus pneumoniae. CRITICAL RESULT CALLED TO, READ BACK BY AND VERIFIED WITH: CAROLYN COULTER AT 1211 04/06/23.PMF    Streptococcus agalactiae NOT  DETECTED NOT DETECTED Final   Streptococcus pneumoniae NOT DETECTED NOT DETECTED Final   Streptococcus pyogenes NOT DETECTED NOT DETECTED Final   A.calcoaceticus-baumannii NOT DETECTED NOT DETECTED Final   Bacteroides fragilis NOT DETECTED NOT DETECTED Final   Enterobacterales NOT DETECTED NOT DETECTED Final   Enterobacter cloacae complex NOT DETECTED NOT DETECTED Final   Escherichia coli NOT DETECTED NOT DETECTED Final   Klebsiella aerogenes NOT DETECTED NOT DETECTED Final   Klebsiella oxytoca NOT DETECTED NOT DETECTED Final   Klebsiella pneumoniae NOT DETECTED NOT DETECTED Final   Proteus species NOT DETECTED NOT DETECTED Final   Salmonella species NOT DETECTED NOT DETECTED Final   Serratia marcescens NOT DETECTED NOT DETECTED Final   Haemophilus influenzae NOT DETECTED NOT DETECTED Final   Neisseria meningitidis NOT DETECTED NOT DETECTED Final   Pseudomonas aeruginosa NOT DETECTED NOT DETECTED Final   Stenotrophomonas maltophilia NOT DETECTED NOT DETECTED Final   Candida albicans NOT DETECTED NOT DETECTED Final   Candida auris NOT DETECTED NOT DETECTED Final   Candida glabrata NOT DETECTED NOT DETECTED Final   Candida krusei NOT DETECTED NOT DETECTED Final   Candida parapsilosis NOT DETECTED NOT DETECTED Final   Candida tropicalis NOT DETECTED NOT DETECTED Final   Cryptococcus neoformans/gattii NOT DETECTED NOT DETECTED Final    Comment: Performed at [facility], [address] [place], [place] [address]  Culture, blood (Routine X 2) w Reflex to ID Panel  Status: None (Preliminary result)   Collection Time: 04/07/23 12:41 PM   Specimen: BLOOD  Result Value Ref Range Status   Specimen Description BLOOD LEFT ANTECUBITAL  Final   Special Requests   Final    BOTTLES DRAWN AEROBIC AND ANAEROBIC Blood Culture adequate volume   Culture   Final    NO GROWTH 3 DAYS Performed at [facility], [address], [place], [place] [address]    Report Status PENDING   Incomplete  Culture, blood (Routine X 2) w Reflex to ID Panel     Status: None (Preliminary result)   Collection Time: 04/07/23 12:41 PM   Specimen: BLOOD  Result Value Ref Range Status   Specimen Description BLOOD RIGHT ANTECUBITAL  Final   Special Requests   Final    BOTTLES DRAWN AEROBIC AND ANAEROBIC Blood Culture adequate volume   Culture   Final    NO GROWTH 3 DAYS Performed at [facility], [address], [place], [place] [address]    Report Status PENDING  Incomplete      Radiology Studies last 3 days: CT MAXILLOFACIAL W CONTRAST  Result Date: 04/09/2023 CLINICAL DATA:  Unclear source of strep bacteremia with concerns for dental infection. EXAM: CT MAXILLOFACIAL WITH CONTRAST TECHNIQUE: Multidetector CT imaging of the maxillofacial structures was performed with intravenous contrast. Multiplanar CT image reconstructions were also generated. RADIATION DOSE REDUCTION: This exam was performed according to the departmental dose-optimization program which includes automated exposure control, adjustment of the mA and/or kV according to patient size and/or use of iterative reconstruction technique. CONTRAST:  75mL OMNIPAQUE IOHEXOL 300 MG/ML  SOLN COMPARISON:  Maxillofacial CT 01/22/2019. FINDINGS: Osseous: Extensive dental caries and periapical lucencies of the remaining maxillary and mandibular teeth. Fractured right lateral maxillary incisor. Orbits: Negative. No traumatic or inflammatory finding. Sinuses: Well aerated. Soft tissues: Unremarkable. Limited intracranial: Unremarkable. IMPRESSION: Extensive dental caries and periapical lucencies of the remaining maxillary and mandibular teeth. Fractured right lateral maxillary incisor. Electronically Signed   By: Orvan Falconer M.D.   On: 04/09/2023 15:49   ECHO TEE  Result Date: 04/09/2023    TRANSESOPHOGEAL ECHO REPORT   Patient Name:   JAVONTA GRONAU Inscoe Date of Exam: 04/09/2023 Medical Rec #:  [id number]     Height:       74.0 in  Accession #:    [id number]    Weight:       230.2 lb Date of Birth:  Nov 28, 1946     BSA:          2.307 m Patient Age:    31 years      BP:           119/54 mmHg Patient Gender: M             HR:           66 bpm. Exam Location:  [facility] Procedure: Transesophageal Echo, Cardiac Doppler and Color Doppler Indications:     Bacteremia R78.81  History:         Patient has prior history of Echocardiogram examinations, most                  recent 04/06/2023. Signs/Symptoms:Murmur; Risk                  Factors:Hypertension and Diabetes. S/P TAVR.                  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  Sonographer:     Cristela Blue Referring Phys:  [id number] BERGE Diagnosing Phys: Debbe Odea MD PROCEDURE: The transesophogeal probe was passed without difficulty through the esophogus of the patient. Sedation performed by performing physician. The patient developed no complications during the procedure.  IMPRESSIONS  1. Left ventricular ejection fraction, by estimation, is 55 to 60%. The left ventricle has normal function.  2. Right ventricular systolic function is normal. The right ventricular size is normal.  3. No left atrial/left atrial appendage thrombus was detected.  4. The mitral valve is normal in structure. Trivial mitral valve regurgitation.  5. The aortic valve has been repaired/replaced. Aortic valve regurgitation is mild. 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  6. Aortic dilatation noted. There is mild dilatation of the aortic root, measuring 43 mm. Conclusion(s)/Recommendation(s): No evidence of vegetation/infective endocarditis on this transesophageael echocardiogram. FINDINGS  Left Ventricle: Left ventricular ejection fraction, by estimation, is 55 to 60%. The left ventricle has normal function. The left ventricular internal cavity size was normal in size. Right Ventricle: The right ventricular size is normal. No increase  in right ventricular wall thickness. Right ventricular systolic function is normal. Left Atrium: Left atrial size was normal in size. No left atrial/left atrial appendage thrombus was detected. Right Atrium: Right atrial size was normal in size. Pericardium: There is no evidence of pericardial effusion. Mitral Valve: The mitral valve is normal in structure. Trivial mitral valve regurgitation. Tricuspid Valve: The tricuspid valve is normal in structure. Tricuspid valve regurgitation is mild. Aortic Valve: The aortic valve has been repaired/replaced. Aortic valve regurgitation is mild. 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 Pulmonic Valve: The pulmonic valve was normal in structure. Pulmonic valve regurgitation is not visualized. Aorta: Aortic dilatation noted. There is mild dilatation of the aortic root, measuring 43 mm. IAS/Shunts: No atrial level shunt detected by color flow Doppler. Debbe Odea MD Electronically signed by Debbe Odea MD Signature Date/Time: 04/09/2023/1:18:56 PM    Final    MR SHOULDER RIGHT WO CONTRAST  Result Date: 04/09/2023 CLINICAL DATA:  Shoulder pain. Septic arthritis suspected. Osteomyelitis suspected. EXAM: MRI OF THE RIGHT SHOULDER WITHOUT CONTRAST TECHNIQUE: Multiplanar, multisequence MR imaging of the shoulder was performed. No intravenous contrast was administered. COMPARISON:  Right shoulder MRI 01/02/2019. FINDINGS: Despite efforts by the technologist and patient, mild motion artifact is present on today's exam and could not be eliminated. This reduces exam sensitivity and specificity. Rotator cuff: Progressive infraspinatus tendinosis with interval development of a small intrasubstance insertional tear extending to the bursal surface, best seen on coronal image 10/9. No full-thickness component or tendon retraction identified. There is mild supraspinatus tendinosis. The subscapularis and teres minor tendons appear normal. Muscles:  No focal  muscular atrophy or edema. Biceps long head: Mild tendinosis. The tendon is intact and normally located. Acromioclavicular Joint: The acromion is type 2. There are mild acromioclavicular degenerative changes. No significant fluid is present in the subacromial - subdeltoid bursa. Glenohumeral Joint: Chronic advanced glenohumeral arthropathy which has progressed from the previous study. There is diffuse chondral thinning with osteophyte and subchondral cyst formation. Subchondral edema in the glenoid has improved compared with the prior study, and no bone destruction is identified. There is a small shoulder joint effusion with most of the fluid anteriorly in the superior subscapularis recess and the bicipital groove. Labrum: Diffusely degenerated with degenerative tearing superiorly and posteriorly. No significant paralabral cyst. Bones: Chronic glenohumeral arthropathy without findings highly suspicious  for septic arthritis or osteomyelitis. Increased subcortical cyst formation in the humeral head near the supraspinatus insertion. Other: No periarticular inflammatory changes or fluid collections are demonstrated to suggest septic arthritis. IMPRESSION: 1. Chronic advanced glenohumeral arthropathy which has progressed from the prior study of 01/02/2019. No specific evidence of septic arthritis or osteomyelitis. 2. Progressive degenerative tearing of the labrum posteriorly and superiorly. 3. Progressive infraspinatus tendinosis with small intrasubstance insertional tear. No full-thickness rotator cuff tear, tendon retraction or focal muscular atrophy. 4. Mild bicipital tendinosis. Electronically Signed   By: Carey Bullocks M.D.   On: 04/09/2023 08:21   MR THORACIC SPINE WO CONTRAST  Result Date: 04/07/2023 CLINICAL DATA:  Mid back pain. Infection suspected. Abnormal x-ray/CT. Cirrhosis. Altered mental status. EXAM: MRI THORACIC SPINE WITHOUT CONTRAST TECHNIQUE: Multiplanar, multisequence MR imaging of the thoracic  spine was performed. No intravenous contrast was administered. COMPARISON:  Chest CT 2 days ago. Lumbar MRI 05/17/2022. Chest CT 04/06/2022 chest CT 05/14/2022. FINDINGS: Alignment:  No significant malalignment. Vertebrae: Old inferior endplate fracture at T5 and superior endplate fracture at T6. Some bone marrow edema in this region that is likely related to subsequent degenerative change. No T2 bright material in the disc space. Old compression fracture at T12 with more recent worsening in the inferior T12 vertebral body portion, also with development of a Schmorl's node at the superior endplate of L1. Similarly, this is favored to be extension of previous fracture more likely than worsening changes related to spinal infection. See below. Cord:  No primary cord lesion.  See below regarding stenosis. Paraspinal and other soft tissues: No paravertebral inflammatory changes are appreciated by MRI. Disc levels: T1-2: Minor disc bulge.  Mild facet degeneration.  No stenosis. T2-3: Moderate disc bulge and facet degeneration. No compressive stenosis. T3-4: Normal interspace. T4-5: Minimal disc bulge.  No stenosis. T5-6: Chronic disc herniation with calcification. Narrowing of the canal with slight deformity of the cord. Some subarachnoid space does surround the cord. 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 T6-7: Chronic shallow disc protrusion with partial calcification. Narrowing of the ventral subarachnoid space but no compression of the cord. T7-8: Chronic shallow protrusion more prominent towards the left with partial calcification. Narrowing of the ventral subarachnoid space but no compression of the cord. T8-9: Small central disc protrusion but no neural compression. T9-10: Small central to left posterolateral disc protrusion but no neural compression. T10-11: Endplate osteophytes and shallow protrusion of the disc. Facet and ligamentous hypertrophy. Canal narrowing with narrowing of the subarachnoid space  surrounding the cord. T11-12: Disc bulge.  No stenosis. T12-L1: Disc bulge.  No stenosis. IMPRESSION: 1. Old inferior endplate fracture at T5 and superior endplate fracture at T6. Some bone marrow edema in this region is likely related to subsequent degenerative change. No T2 bright material in the disc space to suggest the presence of infection. If there are strong clinical indications of infection, 1 could consider a follow-up scan in several weeks. I do not think it is possible to completely exclude infection at this point in time. 2. Old compression fracture at T12 with more recent worsening in the inferior T12 vertebral body portion, also with development of a Schmorl's node at the superior endplate of L1. Similarly, this is favored to be extension of previous fracture more likely than worsening changes related to spinal infection. As above, if concern persists this could be re-evaluated in several weeks. I do not think it is possible to completely exclude infection at this point in time. 3. 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 Narrowing of the ventral subarachnoid space but no compression of the cord. Electronically Signed   By: Paulina Fusi M.D.   On: 04/07/2023 14:05             LOS: 5 days       Silvano Bilis, MD [facility] 04/10/2023, 4:17 PM     Staff may message me via secure chat in Epic  but this may not receive an immediate response,  please page me for urgent matters!  If 7PM-7AM, please contact night coverage [email or web address]

## 2023-04-10 NOTE — Progress Notes (Signed)
Physical Therapy Treatment Patient Details Name: Joshua Berry MRN: [id number] DOB: 09/09/1946 Today's Date: 04/10/2023   History of Present Illness Patient is a 77 year old male with chief complaint of AMS, found to be septic with infection source unclear. History of Joshua Berry, liver cirrhosis, hypertension, iron deficiency anemia, chronic thrombocytopenia   PT Comments    Patient required significant assistance with bed mobility today. Patient also incontinent of bowel and required assistance for all hygiene and peri-care needs in bed. Sitting balance was poor with moderate external support required just to maintain midline sitting balance. Unable  to progress to standing today due to poor overall activity tolerance. Anticipate patient may now require frequent supervision/assistance at discharge as patient with slow overall progress with mobility. PT will continue to follow to maximize independence and to decrease caregiver burden.   Recommendations for follow up therapy are one component of a multi-disciplinary discharge planning process, led by the attending physician.  Recommendations may be updated based on patient status, additional functional criteria and insurance authorization.  Follow Up Recommendations  Can patient physically be transported by private vehicle: No    Assistance Recommended at Discharge Frequent or constant Supervision/Assistance  Patient can return home with the following A lot of help with walking and/or transfers;A lot of help with bathing/dressing/bathroom;Assistance with cooking/housework;Assist for transportation;Help with stairs or ramp for entrance;Direct supervision/assist for medications management   Equipment Recommendations  None recommended by PT    Recommendations for Other Services       Precautions / Restrictions Precautions Precautions: Fall Restrictions Weight Bearing Restrictions: No     Mobility  Bed Mobility Overal bed mobility: Needs  Assistance Bed Mobility: Supine to Sit, Sit to Supine, Rolling Rolling: Max assist, +2 for physical assistance   Supine to sit: Max assist Sit to supine: Max assist   General bed mobility comments: significant assistance required with bed mobility efforts. assistance for trunk and BLE support to sit upright. +2 person assistance required for rolling to each side for peri-care required after having bowel incontinence    Transfers Overall transfer level: Needs assistance                 General transfer comment: Max A required for one scooting bout to the left with maximal cues for technique. unable to progress to standing due to poor participation, fatigue with minimal activity, poor balance    Ambulation/Gait                   Stairs             Wheelchair Mobility    Modified Rankin (Stroke Patients Only)       Balance Overall balance assessment: Needs assistance Sitting-balance support: Feet supported Sitting balance-Leahy Scale: Poor Sitting balance - Comments: periods of Moderate assistance required secondary to posterior lean                                    Cognition Arousal/Alertness: Lethargic Behavior During Therapy: Flat affect Overall Cognitive Status: No family/caregiver present to determine baseline cognitive functioning                                 General Comments: slow processing, able to follow single step commands with increased time and multi modal cues        Exercises  General Comments        Pertinent Vitals/Pain Pain Assessment Pain Assessment: Faces Faces Pain Scale: Hurts little more Pain Location: buttocks Pain Descriptors / Indicators: Discomfort, Sore Pain Intervention(s): Limited activity within patient's tolerance, Monitored during session, Repositioned    Home Living                          Prior Function            PT Goals (current goals can now be  found in the care plan section) Acute Rehab PT Goals Patient Stated Goal: none stated PT Goal Formulation: With patient Time For Goal Achievement: 04/20/23 Potential to Achieve Goals: Fair Progress towards PT goals: Progressing toward goals    Frequency    Min 3X/week      PT Plan Discharge plan needs to be updated    Co-evaluation              AM-PAC PT "6 Clicks" Mobility   Outcome Measure  Help needed turning from your back to your side while in a flat bed without using bedrails?: A Lot Help needed moving from lying on your back to sitting on the side of a flat bed without using bedrails?: A Lot Help needed moving to and from a bed to a chair (including a wheelchair)?: A Lot Help needed standing up from a chair using your arms (e.g., wheelchair or bedside chair)?: A Lot Help needed to walk in hospital room?: A Lot Help needed climbing 3-5 steps with a railing? : Total 6 Click Score: 11    End of Session   Activity Tolerance: Patient limited by fatigue Patient left: in bed;with call bell/phone within reach;with bed alarm set;with nursing/sitter in room Nurse Communication: Mobility status PT Visit Diagnosis: Unsteadiness on feet (R26.81);Muscle weakness (generalized) (M62.81)     Time: 6213-0865 PT Time Calculation (min) (ACUTE ONLY): 24 min  Charges:  $Therapeutic Activity: 23-37 mins                     Donna Bernard, PT, MPT    Joshua Berry 04/10/2023, 1:17 PM

## 2023-04-10 NOTE — TOC Progression Note (Signed)
Transition of Care [facility]) - Progression Note    Patient Details  Name: Joshua Berry MRN: [id number] Date of Birth: 05-Jan-1946  [facility] CM/SW Contact  Allena Katz, LCSW Phone Number: 04/10/2023, 3:22 PM  Clinical Narrative:   CSW spoke with family who would like pt to go to [place] through the [place]. CSW will fax off request to [place].          Expected Discharge Plan and Services                                               Social Determinants of Health (SDOH) Interventions SDOH Screenings   Food Insecurity: No Food Insecurity (04/06/2023)  Housing: Low Risk  (04/06/2023)  Transportation Needs: No Transportation Needs (04/06/2023)  Utilities: Not At Risk (04/06/2023)  Depression (PHQ2-9): Low Risk  (01/07/2022)  Recent Concern: Depression (PHQ2-9) - Medium Risk (12/04/2021)  Tobacco Use: Medium Risk (04/09/2023)    Readmission Risk Interventions    05/17/2022    1:45 PM  Readmission Risk Prevention Plan  Transportation Screening Complete  PCP or Specialist Appt within 5-7 Days Complete  Home Care Screening Complete  Medication Review (RN CM) Complete

## 2023-04-11 ENCOUNTER — Encounter: Payer: Self-pay | Admitting: Cardiology

## 2023-04-11 DIAGNOSIS — R7881 Bacteremia: Secondary | ICD-10-CM | POA: Diagnosis not present

## 2023-04-11 DIAGNOSIS — R338 Other retention of urine: Secondary | ICD-10-CM | POA: Insufficient documentation

## 2023-04-11 DIAGNOSIS — B955 Unspecified streptococcus as the cause of diseases classified elsewhere: Secondary | ICD-10-CM | POA: Diagnosis not present

## 2023-04-11 LAB — GLUCOSE, CAPILLARY
Glucose-Capillary: 112 mg/dL — ABNORMAL HIGH (ref 70–99)
Glucose-Capillary: 212 mg/dL — ABNORMAL HIGH (ref 70–99)
Glucose-Capillary: 175 mg/dL — ABNORMAL HIGH (ref 70–99)
Glucose-Capillary: 160 mg/dL — ABNORMAL HIGH (ref 70–99)

## 2023-04-11 LAB — CBC
HCT: 36.2 % — ABNORMAL LOW (ref 39.0–52.0)
Hemoglobin: 12.8 g/dL — ABNORMAL LOW (ref 13.0–17.0)
MCH: 32.1 pg (ref 26.0–34.0)
MCHC: 35.4 g/dL (ref 30.0–36.0)
MCV: 90.7 fL (ref 80.0–100.0)
Platelets: 57 10*3/uL — ABNORMAL LOW (ref 150–400)
RBC: 3.99 MIL/uL — ABNORMAL LOW (ref 4.22–5.81)
RDW: 15.2 % (ref 11.5–15.5)
WBC: 5 10*3/uL (ref 4.0–10.5)
nRBC: 0 % (ref 0.0–0.2)

## 2023-04-11 LAB — CULTURE, BLOOD (ROUTINE X 2): Special Requests: ADEQUATE

## 2023-04-11 MED ORDER — TRAZODONE HCL 50 MG PO TABS: 50.0000 mg | ORAL_TABLET | Freq: Every evening | ORAL | Status: DC | PRN

## 2023-04-11 MED ORDER — POLYVINYL ALCOHOL 1.4 % OP SOLN
1.0000 [drp] | OPHTHALMIC | Status: DC | PRN
  Administered 2023-04-13: 1 [drp] via OPHTHALMIC
  Filled 2023-04-11: qty 15

## 2023-04-11 MED ORDER — TAMSULOSIN HCL 0.4 MG PO CAPS
0.4000 mg | ORAL_CAPSULE | Freq: Every day | ORAL | Status: DC
  Administered 2023-04-11 – 2023-04-12 (×2): 0.4 mg via ORAL
  Filled 2023-04-11 (×2): qty 1

## 2023-04-11 NOTE — Progress Notes (Addendum)
PROGRESS NOTE    Joshua Berry   ZOX:[id number] DOB: 08-10-1946  DOA: 04/05/2023 Date of Service: 04/11/23 PCP: [facility]     Brief Narrative / Hospital Course:  Joshua Berry is a 77 year old male with history of NASH liver cirrhosis, hypertension, iron deficiency anemia, chronic thrombocytopenia, who presents from [facility] via EMS on 04/05/23 to emergency department for chief concerns of altered mental status. According to his wife he became progressively altered throughout the day and afternoon yesterday. No recent illnesses, trauma, other acute medical complaints. Last night given his altered mental status did not take his lactulose.  04/20: disoriented to situation but oriented to self. (+)fever, tachypnea, low WBC 3.8 = SIRS/sepsis. Ammonia elevated to 71. Lactic 2.1 --> 1.7. COVID/flu/RSV neg. UA neg for UTI. CXR non-acute. CT head non-acute, (+)chronic microvascular ischemic change and cerebral volume loss. CT C/A/P: liver disease, patent TIPS shunt, severe T12 compression question subacute, no noted infection. Admitted for concern for sepsis / hepatic encephalopathy  04/21: MRI brain non-acute. Ammonia down to 33. BCx (+)strep, change abx to ceftriaxone, Echo ordered, will repeat BCx and ask ID to see tomorrow. PT/OT ordered.  04/22: stable. Repeat BCx. MRI to eval for osteo Tspine given compression fx --> no osteo noted. TTE no concerns. ID saw patient - will need 4 weeks IV abx.  04/23: Cardiology consulted for TEE.  4/24: TEE and mri of right shoulder negative, CT maxillofacial extensive caries, no abscess       Consultants:  Infectious Disease Cardiology    Procedures: none           ASSESSMENT & PLAN:   Principal Problem:   Severe sepsis Active Problems:   Acute hepatic encephalopathy   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   Chronic diastolic CHF (congestive heart failure)   Hypothyroidism    Overweight (BMI 25.0-29.9)   Secondary esophageal varices without bleeding   HLD (hyperlipidemia)   HTN (hypertension)   Physical deconditioning   Hypomagnesemia   Hepatic encephalopathy      Sepsis w/ positive BCx x4 strep, source likely dental infection  Blood cultures (+)strep  Cefepime and vancomycin per pharmacy --> ceftriaxone, ID has transitioned to oral amoxicillin through 5/6  following culture results, most recent cultures from 4/22 are ngtd TEE negative Mri right shoulder negative  ct of maxillofacial no abscess, has extensive caries  Dental caries Known problem. His dentist hesitant to remove teeth given bleeding risk. He was referred to and has seen an oral surgeon in [place]. That surgeon is planning to remove all the patient's teeth. The procedure still needs to be scheduled.   Compression fracture T-spine MRI showed no infectious process in spine Pain control  Lower abdominal pain Urinary retention Tolerating diet, no dysuria, having BMs, reports difficulty urinating, bladder scan today shows about 650 ml - I/o cath - monitor scans and cath prn - start flomax   R shoulder pain Mri shows degenerative changes   Altered mental status Likely hepatic encephalopathy + sepsis  MRI of the brain without contrast no concerns. Mental status improved today with up-titration of lactulose and 2 bms yesterday    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 twice daily lactulose 30 g p.o. thrice daily   Thrombocytopenia D/t liver disease Has been unable to get dental extractions d/t this per daughter  Monitor Plt, stable at around 50   Hypothyroidism Levothyroxine 100 mcg daily    HTN (hypertension) Atorvastatin 20  mg nightly    IDDM Type 2 SSI ac Basal insulin      DVT prophylaxis: TED hose, no Rx ppx d/t chronic thrombocytopenia  Pertinent IV fluids/nutrition: no continuous IV fluids; regular diet   Central lines / invasive devices: none   Code Status: DNR - DNR, admitting hospitalist Dr Sedalia Muta noted she confirmed this with daughter at bedside 04/20. Daughter states patient has a MOST form  ACP documentation reviewed: 04/06/23, 8:01 AM: none on file    Current Admission Status: inpatient  TOC needs / Dispo plan:  snf Barriers to discharge / significant pending items: snf placement   Family communication: daughter updated telephonically 4/25    Subjective / Brief ROS:  Reports chronic back pain, some lower abd pain, no dysuria. Says trouble urinating      Objective Findings:  Vitals:   04/10/23 1534 04/10/23 2010 04/11/23 0401 04/11/23 0728  BP: 120/63 (!) 157/78 125/61 (!) 160/103  Pulse: 65 65 (!) 56   Resp: 18 16 16 16   Temp: 99.8 F (37.7 C) 99.3 F (37.4 C) 98.4 F (36.9 C) 97.7 F (36.5 C)  TempSrc:  Oral    SpO2: 100% 98% 97% 100%  Weight:      Height:        Intake/Output Summary (Last 24 hours) at 04/11/2023 1339 Last data filed at 04/10/2023 2100 Gross per 24 hour  Intake --  Output 300 ml  Net -300 ml   Filed Weights   04/05/23 1628  Weight: 104.4 kg    Examination:  NAD Confused CTAB RRR Abdomen soft, non-tender Ext warm, no edema       Scheduled Medications:   acidophilus  1 capsule Oral Daily   amoxicillin  1,000 mg Oral Q8H   ascorbic acid  1,000 mg Oral Daily   atorvastatin  20 mg Oral QHS   famotidine  20 mg Oral Daily   folic acid  1 mg Oral Daily   insulin aspart  0-9 Units Subcutaneous TID WC   insulin aspart  3 Units Subcutaneous TID WC   insulin glargine-yfgn  10 Units Subcutaneous QHS   lactulose  30 g Oral TID   levothyroxine  100 mcg Oral Q0600   magnesium oxide  800 mg Oral BID   melatonin  10 mg Oral QHS   pantoprazole  40 mg Oral BID   rifaximin  550 mg Oral BID    Continuous Infusions:    PRN Medications:  oxyCODONE, senna-docusate, trolamine salicylate  Antimicrobials from admission:   Anti-infectives (From admission, onward)    Start     Dose/Rate Route Frequency Ordered Stop   04/10/23 2200  amoxicillin (AMOXIL) capsule 1,000 mg        1,000 mg Oral Every 8 hours 04/10/23 1622 04/21/23 2159   04/06/23 2200  vancomycin (VANCOREADY) IVPB 2000 mg/400 mL  Status:  Discontinued        2,000 mg 200 mL/hr over 120 Minutes Intravenous Every 24 hours 04/05/23 2054 04/06/23 1224   04/06/23 1400  cefTRIAXone (ROCEPHIN) 2 g in sodium chloride 0.9 % 100 mL IVPB  Status:  Discontinued        2 g 200 mL/hr over 30 Minutes Intravenous Every 24 hours 04/06/23 1224 04/10/23 1622   04/05/23 2200  rifaximin (XIFAXAN) tablet 550 mg       Note to Pharmacy: Takes with lactulose     550 mg Oral 2 times daily 04/05/23 1800     04/05/23 2200  vancomycin (  VANCOCIN) IVPB 1000 mg/200 mL premix       See Hyperspace for full Linked Orders Report.   1,000 mg 200 mL/hr over 60 Minutes Intravenous  Once 04/05/23 2054 04/06/23 0104   04/05/23 2100  vancomycin (VANCOREADY) IVPB 1500 mg/300 mL       See Hyperspace for full Linked Orders Report.   1,500 mg 150 mL/hr over 120 Minutes Intravenous  Once 04/05/23 2054 04/06/23 0456   04/05/23 2100  ceFEPIme (MAXIPIME) 2 g in sodium chloride 0.9 % 100 mL IVPB  Status:  Discontinued        2 g 200 mL/hr over 30 Minutes Intravenous Every 8 hours 04/05/23 2054 04/06/23 1224           Data Reviewed:  I have personally reviewed the following...  CBC: Recent Labs  Lab 04/05/23 1642 04/06/23 0518 04/07/23 0530 04/09/23 0545 04/10/23 0531 04/11/23 0608  WBC 3.8* 5.5 5.1 4.8 4.4 5.0  NEUTROABS 2.9  --   --   --   --   --   HGB 13.1 12.4* 11.9* 11.7* 12.1* 12.8*  HCT 37.4* 35.4* 34.4* 33.0* 34.5* 36.2*  MCV 93.0 93.2 93.5 92.2 93.0 90.7  PLT 37* 31* 32* 46* 50* 57*   Basic Metabolic Panel: Recent Labs  Lab 04/05/23 1642 04/06/23 0518 04/07/23 0530 04/09/23 0545  NA 137 134* 138 135  K 3.8 3.8 3.7 3.4*  CL 107 106 109 104  CO2 22 20*  23 24  GLUCOSE 122* 145* 125* 96  BUN 14 17 16 16   CREATININE 1.14 1.06 0.99 0.94  CALCIUM 9.1 8.6* 8.4* 8.3*  MG 1.6* 1.8  --   --    GFR: Estimated Creatinine Clearance: 83.5 mL/min (by C-G formula based on SCr of 0.94 mg/dL). Liver Function Tests: Recent Labs  Lab 04/05/23 1642 04/07/23 0530  AST 53* 71*  ALT 33 29  ALKPHOS 95 71  BILITOT 2.8* 2.5*  PROT 6.6 5.7*  ALBUMIN 3.2* 2.7*   No results for input(s): "LIPASE", "AMYLASE" in the last 168 hours. Recent Labs  Lab 04/05/23 1642 04/06/23 0947  AMMONIA 71* 33   Coagulation Profile: No results for input(s): "INR", "PROTIME" in the last 168 hours. Cardiac Enzymes: Recent Labs  Lab 04/05/23 1642  CKTOTAL 114   BNP (last 3 results) No results for input(s): "PROBNP" in the last 8760 hours. HbA1C: No results for input(s): "HGBA1C" in the last 72 hours. CBG: Recent Labs  Lab 04/10/23 1148 04/10/23 1536 04/10/23 2011 04/11/23 0731 04/11/23 1137  GLUCAP 137* 151* 84 112* 212*   Lipid Profile: No results for input(s): "CHOL", "HDL", "LDLCALC", "TRIG", "CHOLHDL", "LDLDIRECT" in the last 72 hours. Thyroid Function Tests: No results for input(s): "TSH", "T4TOTAL", "FREET4", "T3FREE", "THYROIDAB" in the last 72 hours. Anemia Panel: No results for input(s): "VITAMINB12", "FOLATE", "FERRITIN", "TIBC", "IRON", "RETICCTPCT" in the last 72 hours. Most Recent Urinalysis On File:     Component Value Date/Time   COLORURINE YELLOW (A) 04/06/2023 0217   APPEARANCEUR CLEAR (A) 04/06/2023 0217   APPEARANCEUR Hazy 05/01/2014 1653   LABSPEC >1.046 (H) 04/06/2023 0217   LABSPEC 1.015 05/01/2014 1653   PHURINE 5.0 04/06/2023 0217   GLUCOSEU NEGATIVE 04/06/2023 0217   GLUCOSEU Negative 05/01/2014 1653   HGBUR SMALL (A) 04/06/2023 0217   BILIRUBINUR NEGATIVE 04/06/2023 0217   BILIRUBINUR Negative 05/01/2014 1653   KETONESUR NEGATIVE 04/06/2023 0217   PROTEINUR 30 (A) 04/06/2023 0217   NITRITE NEGATIVE 04/06/2023 0217    LEUKOCYTESUR NEGATIVE  04/06/2023 0217   LEUKOCYTESUR Negative 05/01/2014 1653   Sepsis Labs: @LABRCNTIP (procalcitonin:4,lacticidven:4) Microbiology: Recent Results (from the past 240 hour(s))  Resp panel by RT-PCR (RSV, Flu A&B, Covid)     Status: None   Collection Time: 04/05/23  9:24 PM   Specimen: Nasal Swab  Result Value Ref Range Status   SARS Coronavirus 2 by RT PCR NEGATIVE NEGATIVE Final    Comment: (NOTE) SARS-CoV-2 target nucleic acids are NOT DETECTED.  The SARS-CoV-2 RNA is generally detectable in upper respiratory specimens during the acute phase of infection. The lowest concentration of SARS-CoV-2 viral copies this assay can detect is 138 copies/mL. A negative result does not preclude SARS-Cov-2 infection and should not be used as the sole basis for treatment or other patient management decisions. A negative result may occur with  improper specimen collection/handling, submission of specimen other than nasopharyngeal swab, presence of viral mutation(s) within the areas targeted by this assay, and inadequate number of viral copies(<138 copies/mL). A negative result must be combined with clinical observations, patient history, and epidemiological information. The expected result is Negative.  Fact Sheet for Patients:  BloggerCourse.com  Fact Sheet for Healthcare Providers:  SeriousBroker.it  This test is no t yet approved or cleared by the [place] FDA and  has been authorized for detection and/or diagnosis of SARS-CoV-2 by FDA under an Emergency Use Authorization (EUA). This EUA will remain  in effect (meaning this test can be used) for the duration of the COVID-19 declaration under Section 564(b)(1) of the Act, 21 U.S.C.section 360bbb-3(b)(1), unless the authorization is terminated  or revoked sooner.       Influenza A by PCR NEGATIVE NEGATIVE Final   Influenza B by PCR NEGATIVE NEGATIVE Final     Comment: (NOTE) The Xpert Xpress SARS-CoV-2/FLU/RSV plus assay is intended as an aid in the diagnosis of influenza from Nasopharyngeal swab specimens and should not be used as a sole basis for treatment. Nasal washings and aspirates are unacceptable for Xpert Xpress SARS-CoV-2/FLU/RSV testing.  Fact Sheet for Patients: BloggerCourse.com  Fact Sheet for Healthcare Providers: SeriousBroker.it  This test is not yet approved or cleared by the [place] FDA and has been authorized for detection and/or diagnosis of SARS-CoV-2 by FDA under an Emergency Use Authorization (EUA). This EUA will remain in effect (meaning this test can be used) for the duration of the COVID-19 declaration under Section 564(b)(1) of the Act, 21 U.S.C. section 360bbb-3(b)(1), unless the authorization is terminated or revoked.     Resp Syncytial Virus by PCR NEGATIVE NEGATIVE Final    Comment: (NOTE) Fact Sheet for Patients: BloggerCourse.com  Fact Sheet for Healthcare Providers: SeriousBroker.it  This test is not yet approved or cleared by the [place] FDA and has been authorized for detection and/or diagnosis of SARS-CoV-2 by FDA under an Emergency Use Authorization (EUA). This EUA will remain in effect (meaning this test can be used) for the duration of the COVID-19 declaration under Section 564(b)(1) of the Act, 21 U.S.C. section 360bbb-3(b)(1), unless the authorization is terminated or revoked.  Performed at [facility], [address] [place], [place] [address]   Culture, blood (Routine X 2) w Reflex to ID Panel     Status: Abnormal   Collection Time: 04/05/23  9:24 PM   Specimen: BLOOD RIGHT ARM  Result Value Ref Range Status   Specimen Description   Final    BLOOD RIGHT ARM Performed at [facility], [address] [place], [place] [address]  Special Requests   Final     BOTTLES DRAWN AEROBIC AND ANAEROBIC Blood Culture adequate volume Performed at [facility], [address] [place], [place] [address]    Culture  Setup Time   Final    GRAM POSITIVE COCCI IN BOTH AEROBIC AND ANAEROBIC BOTTLES CRITICAL RESULT CALLED TO, READ BACK BY AND VERIFIED WITH: CAROLYN COULTER AT 1211 04/06/23.PMF  REVIEWED BY A. LAFRANCE    Culture (A)  Final    STREPTOCOCCUS ANGINOSIS SUSCEPTIBILITIES PERFORMED ON PREVIOUS CULTURE WITHIN THE LAST 5 DAYS. Performed at [facility], [address] [place], [place] [address]    Report Status 04/08/2023 FINAL  Final  Culture, blood (Routine X 2) w Reflex to ID Panel     Status: Abnormal   Collection Time: 04/05/23  9:24 PM   Specimen: BLOOD LEFT ARM  Result Value Ref Range Status   Specimen Description BLOOD LEFT ARM  Final   Special Requests   Final    BOTTLES DRAWN AEROBIC AND ANAEROBIC Blood Culture results may not be optimal due to an inadequate volume of blood received in culture bottles   Culture  Setup Time   Final    GRAM POSITIVE COCCI IN BOTH AEROBIC AND ANAEROBIC BOTTLES CRITICAL RESULT CALLED TO, READ BACK BY AND VERIFIED WITH: CAROLYN COULTER AT 1211 04/06/23.PMF  REVIEWED BY A. LAFRANCE    Culture STREPTOCOCCUS ANGINOSIS (A)  Final   Report Status 04/08/2023 FINAL  Final   Organism ID, Bacteria STREPTOCOCCUS ANGINOSIS  Final      Susceptibility   Streptococcus anginosis - MIC*    PENICILLIN <=0.06 SENSITIVE Sensitive     CEFTRIAXONE <=0.12 SENSITIVE Sensitive     ERYTHROMYCIN 1 RESISTANT Resistant     LEVOFLOXACIN 0.5 SENSITIVE Sensitive     VANCOMYCIN 0.5 SENSITIVE Sensitive     * STREPTOCOCCUS ANGINOSIS  Blood Culture ID Panel (Reflexed)     Status: Abnormal   Collection Time: 04/05/23  9:24 PM  Result Value Ref Range Status   Enterococcus faecalis NOT DETECTED NOT DETECTED Final   Enterococcus Faecium NOT DETECTED NOT DETECTED Final   Listeria monocytogenes NOT DETECTED NOT  DETECTED Final   Staphylococcus species NOT DETECTED NOT DETECTED Final   Staphylococcus aureus (BCID) NOT DETECTED NOT DETECTED Final   Staphylococcus epidermidis NOT DETECTED NOT DETECTED Final   Staphylococcus lugdunensis NOT DETECTED NOT DETECTED Final   Streptococcus species DETECTED (A) NOT DETECTED Final    Comment: Not Enterococcus species, Streptococcus agalactiae, Streptococcus pyogenes, or Streptococcus pneumoniae. CRITICAL RESULT CALLED TO, READ BACK BY AND VERIFIED WITH: CAROLYN COULTER AT 1211 04/06/23.PMF    Streptococcus agalactiae NOT DETECTED NOT DETECTED Final   Streptococcus pneumoniae NOT DETECTED NOT DETECTED Final   Streptococcus pyogenes NOT DETECTED NOT DETECTED Final   A.calcoaceticus-baumannii NOT DETECTED NOT DETECTED Final   Bacteroides fragilis NOT DETECTED NOT DETECTED Final   Enterobacterales NOT DETECTED NOT DETECTED Final   Enterobacter cloacae complex NOT DETECTED NOT DETECTED Final   Escherichia coli NOT DETECTED NOT DETECTED Final   Klebsiella aerogenes NOT DETECTED NOT DETECTED Final   Klebsiella oxytoca NOT DETECTED NOT DETECTED Final   Klebsiella pneumoniae NOT DETECTED NOT DETECTED Final   Proteus species NOT DETECTED NOT DETECTED Final   Salmonella species NOT DETECTED NOT DETECTED Final   Serratia marcescens NOT DETECTED NOT DETECTED Final   Haemophilus influenzae NOT DETECTED NOT DETECTED Final   Neisseria meningitidis NOT DETECTED NOT DETECTED Final   Pseudomonas aeruginosa NOT DETECTED NOT  DETECTED Final   Stenotrophomonas maltophilia NOT DETECTED NOT DETECTED Final   Candida albicans NOT DETECTED NOT DETECTED Final   Candida auris NOT DETECTED NOT DETECTED Final   Candida glabrata NOT DETECTED NOT DETECTED Final   Candida krusei NOT DETECTED NOT DETECTED Final   Candida parapsilosis NOT DETECTED NOT DETECTED Final   Candida tropicalis NOT DETECTED NOT DETECTED Final   Cryptococcus neoformans/gattii NOT DETECTED NOT DETECTED Final     Comment: Performed at [facility], [address] [place], [place] [address]  Culture, blood (Routine X 2) w Reflex to ID Panel     Status: None (Preliminary result)   Collection Time: 04/07/23 12:41 PM   Specimen: BLOOD  Result Value Ref Range Status   Specimen Description BLOOD LEFT ANTECUBITAL  Final   Special Requests   Final    BOTTLES DRAWN AEROBIC AND ANAEROBIC Blood Culture adequate volume   Culture   Final    NO GROWTH 4 DAYS Performed at [facility], [address] [place], [place] [address]    Report Status PENDING  Incomplete  Culture, blood (Routine X 2) w Reflex to ID Panel     Status: None (Preliminary result)   Collection Time: 04/07/23 12:41 PM   Specimen: BLOOD  Result Value Ref Range Status   Specimen Description BLOOD RIGHT ANTECUBITAL  Final   Special Requests   Final    BOTTLES DRAWN AEROBIC AND ANAEROBIC Blood Culture adequate volume   Culture   Final    NO GROWTH 4 DAYS Performed at [facility], [address] [place], [place] [address]    Report Status PENDING  Incomplete      Radiology Studies last 3 days: CT MAXILLOFACIAL W CONTRAST  Result Date: 04/09/2023 CLINICAL DATA:  Unclear source of strep bacteremia with concerns for dental infection. EXAM: CT MAXILLOFACIAL WITH CONTRAST TECHNIQUE: Multidetector CT imaging of the maxillofacial structures was performed with intravenous contrast. Multiplanar CT image reconstructions were also generated. RADIATION DOSE REDUCTION: This exam was performed according to the departmental dose-optimization program which includes automated exposure control, adjustment of the mA and/or kV according to patient size and/or use of iterative reconstruction technique. CONTRAST:  75mL OMNIPAQUE IOHEXOL 300 MG/ML  SOLN COMPARISON:  Maxillofacial CT 01/22/2019. FINDINGS: Osseous: Extensive dental caries and periapical lucencies of the remaining maxillary and mandibular teeth. Fractured right lateral  maxillary incisor. Orbits: Negative. No traumatic or inflammatory finding. Sinuses: Well aerated. Soft tissues: Unremarkable. Limited intracranial: Unremarkable. IMPRESSION: Extensive dental caries and periapical lucencies of the remaining maxillary and mandibular teeth. Fractured right lateral maxillary incisor. Electronically Signed   By: Orvan Falconer M.D.   On: 04/09/2023 15:49   ECHO TEE  Result Date: 04/09/2023    TRANSESOPHOGEAL ECHO REPORT   Patient Name:   Joshua Berry Date of Exam: 04/09/2023 Medical Rec #:  [id number]     Height:       74.0 in Accession #:    [id number]    Weight:       230.2 lb Date of Birth:  1946-05-10     BSA:          2.307 m Patient Age:    77 years      BP:           119/54 mmHg Patient Gender: M             HR:           66 bpm. Exam Location:  [facility]  Procedure: Transesophageal Echo, Cardiac Doppler and Color Doppler Indications:     Bacteremia R78.81  History:         Patient has prior history of Echocardiogram examinations, most                  recent 04/06/2023. Signs/Symptoms:Murmur; Risk                  Factors:Hypertension and Diabetes. S/P TAVR.                  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  Sonographer:     Cristela Blue Referring Phys:  [id number] BERGE Diagnosing Phys: Debbe Odea MD PROCEDURE: The transesophogeal probe was passed without difficulty through the esophogus of the patient. Sedation performed by performing physician. The patient developed no complications during the procedure.  IMPRESSIONS  1. Left ventricular ejection fraction, by estimation, is 55 to 60%. The left ventricle has normal function.  2. Right ventricular systolic function is normal. The right ventricular size is normal.  3. No left atrial/left atrial appendage thrombus was detected.  4. The mitral valve is normal in structure. Trivial mitral valve regurgitation.  5. The aortic valve has been repaired/replaced. Aortic  valve regurgitation is mild. 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  6. Aortic dilatation noted. There is mild dilatation of the aortic root, measuring 43 mm. Conclusion(s)/Recommendation(s): No evidence of vegetation/infective endocarditis on this transesophageael echocardiogram. FINDINGS  Left Ventricle: Left ventricular ejection fraction, by estimation, is 55 to 60%. The left ventricle has normal function. The left ventricular internal cavity size was normal in size. Right Ventricle: The right ventricular size is normal. No increase in right ventricular wall thickness. Right ventricular systolic function is normal. Left Atrium: Left atrial size was normal in size. No left atrial/left atrial appendage thrombus was detected. Right Atrium: Right atrial size was normal in size. Pericardium: There is no evidence of pericardial effusion. Mitral Valve: The mitral valve is normal in structure. Trivial mitral valve regurgitation. Tricuspid Valve: The tricuspid valve is normal in structure. Tricuspid valve regurgitation is mild. Aortic Valve: The aortic valve has been repaired/replaced. Aortic valve regurgitation is mild. 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 Pulmonic Valve: The pulmonic valve was normal in structure. Pulmonic valve regurgitation is not visualized. Aorta: Aortic dilatation noted. There is mild dilatation of the aortic root, measuring 43 mm. IAS/Shunts: No atrial level shunt detected by color flow Doppler. Debbe Odea MD Electronically signed by Debbe Odea MD Signature Date/Time: 04/09/2023/1:18:56 PM    Final    MR SHOULDER RIGHT WO CONTRAST  Result Date: 04/09/2023 CLINICAL DATA:  Shoulder pain. Septic arthritis suspected. Osteomyelitis suspected. EXAM: MRI OF THE RIGHT SHOULDER WITHOUT CONTRAST TECHNIQUE: Multiplanar, multisequence MR imaging of the shoulder was performed. No intravenous contrast was administered. COMPARISON:  Right  shoulder MRI 01/02/2019. FINDINGS: Despite efforts by the technologist and patient, mild motion artifact is present on today's exam and could not be eliminated. This reduces exam sensitivity and specificity. Rotator cuff: Progressive infraspinatus tendinosis with interval development of a small intrasubstance insertional tear extending to the bursal surface, best seen on coronal image 10/9. No full-thickness component or tendon retraction identified. There is mild supraspinatus tendinosis. The subscapularis and teres minor tendons appear normal. Muscles:  No focal muscular atrophy or edema. Biceps long  head: Mild tendinosis. The tendon is intact and normally located. Acromioclavicular Joint: The acromion is type 2. There are mild acromioclavicular degenerative changes. No significant fluid is present in the subacromial - subdeltoid bursa. Glenohumeral Joint: Chronic advanced glenohumeral arthropathy which has progressed from the previous study. There is diffuse chondral thinning with osteophyte and subchondral cyst formation. Subchondral edema in the glenoid has improved compared with the prior study, and no bone destruction is identified. There is a small shoulder joint effusion with most of the fluid anteriorly in the superior subscapularis recess and the bicipital groove. Labrum: Diffusely degenerated with degenerative tearing superiorly and posteriorly. No significant paralabral cyst. Bones: Chronic glenohumeral arthropathy without findings highly suspicious for septic arthritis or osteomyelitis. Increased subcortical cyst formation in the humeral head near the supraspinatus insertion. Other: No periarticular inflammatory changes or fluid collections are demonstrated to suggest septic arthritis. IMPRESSION: 1. Chronic advanced glenohumeral arthropathy which has progressed from the prior study of 01/02/2019. No specific evidence of septic arthritis or osteomyelitis. 2. Progressive degenerative tearing of the  labrum posteriorly and superiorly. 3. Progressive infraspinatus tendinosis with small intrasubstance insertional tear. No full-thickness rotator cuff tear, tendon retraction or focal muscular atrophy. 4. Mild bicipital tendinosis. Electronically Signed   By: Carey Bullocks M.D.   On: 04/09/2023 08:21   MR THORACIC SPINE WO CONTRAST  Result Date: 04/07/2023 CLINICAL DATA:  Mid back pain. Infection suspected. Abnormal x-ray/CT. Cirrhosis. Altered mental status. EXAM: MRI THORACIC SPINE WITHOUT CONTRAST TECHNIQUE: Multiplanar, multisequence MR imaging of the thoracic spine was performed. No intravenous contrast was administered. COMPARISON:  Chest CT 2 days ago. Lumbar MRI 05/17/2022. Chest CT 04/06/2022 chest CT 05/14/2022. FINDINGS: Alignment:  No significant malalignment. Vertebrae: Old inferior endplate fracture at T5 and superior endplate fracture at T6. Some bone marrow edema in this region that is likely related to subsequent degenerative change. No T2 bright material in the disc space. Old compression fracture at T12 with more recent worsening in the inferior T12 vertebral body portion, also with development of a Schmorl's node at the superior endplate of L1. Similarly, this is favored to be extension of previous fracture more likely than worsening changes related to spinal infection. See below. Cord:  No primary cord lesion.  See below regarding stenosis. Paraspinal and other soft tissues: No paravertebral inflammatory changes are appreciated by MRI. Disc levels: T1-2: Minor disc bulge.  Mild facet degeneration.  No stenosis. T2-3: Moderate disc bulge and facet degeneration. No compressive stenosis. T3-4: Normal interspace. T4-5: Minimal disc bulge.  No stenosis. T5-6: Chronic disc herniation with calcification. Narrowing of the canal with slight deformity of the cord. Some subarachnoid space does surround the cord. 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 T6-7: Chronic shallow disc protrusion with  partial calcification. Narrowing of the ventral subarachnoid space but no compression of the cord. T7-8: Chronic shallow protrusion more prominent towards the left with partial calcification. Narrowing of the ventral subarachnoid space but no compression of the cord. T8-9: Small central disc protrusion but no neural compression. T9-10: Small central to left posterolateral disc protrusion but no neural compression. T10-11: Endplate osteophytes and shallow protrusion of the disc. Facet and ligamentous hypertrophy. Canal narrowing with narrowing of the subarachnoid space surrounding the cord. T11-12: Disc bulge.  No stenosis. T12-L1: Disc bulge.  No stenosis. IMPRESSION: 1. Old inferior endplate fracture at T5 and superior endplate fracture at T6. Some bone marrow edema in this region is likely related to subsequent degenerative change. No T2 bright material in the disc  space to suggest the presence of infection. If there are strong clinical indications of infection, 1 could consider a follow-up scan in several weeks. I do not think it is possible to completely exclude infection at this point in time. 2. Old compression fracture at T12 with more recent worsening in the inferior T12 vertebral body portion, also with development of a Schmorl's node at the superior endplate of L1. Similarly, this is favored to be extension of previous fracture more likely than worsening changes related to spinal infection. As above, if concern persists this could be re-evaluated in several weeks. I do not think it is possible to completely exclude infection at this point in time. 3. 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 Narrowing of the ventral subarachnoid space but no compression of the cord. Electronically Signed   By: Paulina Fusi M.D.   On: 04/07/2023 14:05             LOS: 6 days       Silvano Bilis, MD [facility] 04/11/2023, 1:39 PM     Staff may message me via secure chat in Epic   but this may not receive an immediate response,  please page me for urgent matters!  If 7PM-7AM, please contact night coverage [email or web address]

## 2023-04-11 NOTE — [place] FL2 (Signed)
[place] MEDICAID FL2 LEVEL OF CARE FORM     IDENTIFICATION  Patient Name: Joshua Berry Birthdate: 08/12/1946 Sex: male Admission Date (Current Location): 04/05/2023  [place] and [place] Number:  Chiropodist and Address:  [facility], [address], [place], [place] [address]      Provider Number: [id number]  Attending Physician Name and Address:  Kathrynn Running, MD  Relative Name and Phone Number:       Current Level of Care:   Recommended Level of Care: Skilled Nursing Facility Prior Approval Number:    Date Approved/Denied:   PASRR Number: [id number]  Discharge Plan: SNF    Current Diagnoses: Patient Active Problem List   Diagnosis Date Noted   Streptococcal bacteremia 04/07/2023   Altered mental status 04/06/2023   Hepatic encephalopathy (HCC) 04/05/2023   Overweight (BMI 25.0-29.9) 08/03/2022   Hyponatremia, hypokalemia and hypomagnesemia 05/18/2022   Hyponatremia 05/18/2022   Hypokalemia 05/18/2022   Back pain 05/17/2022   Hypomagnesemia 05/17/2022   Acute hepatic encephalopathy (HCC) 05/17/2022   AKI (acute kidney injury) (HCC) 05/16/2022   Elevated liver enzymes 05/16/2022   Hypotension 05/16/2022   Right shoulder pain 05/16/2022   Hypothyroidism 05/16/2022   Physical deconditioning 05/16/2022   Obesity (BMI 30-39.9) 05/16/2022   Lactic acidosis 05/16/2022   Fall at home, initial encounter 05/16/2022   Septic shock due to Staphylococcus bacteremia 05/14/2022   Chest pain 04/06/2022   Controlled IDDM-2 with hyperglycemia 04/06/2022   HLD (hyperlipidemia) 04/06/2022   HTN (hypertension) 04/06/2022   Chronic diastolic CHF (congestive heart failure) (HCC) 04/06/2022   Pancytopenia (HCC) 04/06/2022   GI bleeding 04/24/2021   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    Secondary esophageal varices without bleeding (HCC)    Portal hypertension (HCC)    Stomach irritation     Melena    Acute gastrointestinal hemorrhage 07/19/2019   Severe sepsis (HCC) 12/20/2018    Orientation RESPIRATION BLADDER Height & Weight        Normal Incontinent, External catheter Weight: 230 lb 3.2 oz (104.4 kg) Height:  6\' 2"  (188 cm)  BEHAVIORAL SYMPTOMS/MOOD NEUROLOGICAL BOWEL NUTRITION STATUS      Incontinent Diet  AMBULATORY STATUS COMMUNICATION OF NEEDS Skin   Extensive Assist Verbally Normal                       Personal Care Assistance Level of Assistance  Bathing, Feeding, Dressing Bathing Assistance: Maximum assistance Feeding assistance: Limited assistance Dressing Assistance: Maximum assistance     Functional Limitations Info  Sight, Hearing, Speech Sight Info: Impaired Hearing Info: Adequate Speech Info: Adequate    SPECIAL CARE FACTORS FREQUENCY  PT (By licensed PT), OT (By licensed OT)     PT Frequency: 5 times a week OT Frequency: 5 times a week            Contractures Contractures Info: Not present    Additional Factors Info  Code Status Code Status Info: DNR             Current Medications (04/11/2023):  This is the current hospital active medication list Current Facility-Administered Medications  Medication Dose Route Frequency Provider Last Rate Last Admin   acidophilus (RISAQUAD) capsule 1 capsule  1 capsule Oral Daily Cox, Amy N, DO   1 capsule at 04/11/23 1003   amoxicillin (AMOXIL) capsule 1,000 mg  1,000 mg Oral Q8H Wouk, Wilfred Curtis, MD   1,000 mg at 04/11/23 [phone]  ascorbic acid (VITAMIN C) tablet 1,000 mg  1,000 mg Oral Daily Cox, Amy N, DO   1,000 mg at 04/11/23 1001   atorvastatin (LIPITOR) tablet 20 mg  20 mg Oral QHS Cox, Amy N, DO   20 mg at 04/10/23 2132   famotidine (PEPCID) tablet 20 mg  20 mg Oral Daily Cox, Amy N, DO   20 mg at 04/11/23 1002   folic acid (FOLVITE) tablet 1 mg  1 mg Oral Daily Cox, Amy N, DO   1 mg at 04/11/23 1002   insulin aspart (novoLOG) injection 0-9 Units  0-9 Units Subcutaneous TID WC  Sunnie Nielsen, DO   1 Units at 04/10/23 1654   insulin aspart (novoLOG) injection 3 Units  3 Units Subcutaneous TID WC Wouk, Wilfred Curtis, MD   3 Units at 04/11/23 1002   insulin glargine-yfgn (SEMGLEE) injection 10 Units  10 Units Subcutaneous QHS Sunnie Nielsen, DO   10 Units at 04/10/23 2133   lactulose (CHRONULAC) 10 GM/15ML solution 30 g  30 g Oral TID Kathrynn Running, MD   30 g at 04/11/23 1002   levothyroxine (SYNTHROID) tablet 100 mcg  100 mcg Oral Q0600 Cox, Amy N, DO   100 mcg at 04/11/23 0542   magnesium oxide (MAG-OX) tablet 800 mg  800 mg Oral BID Cox, Amy N, DO   800 mg at 04/11/23 1002   melatonin tablet 10 mg  10 mg Oral QHS Cox, Amy N, DO   10 mg at 04/10/23 2133   oxyCODONE (Oxy IR/ROXICODONE) immediate release tablet 5 mg  5 mg Oral Q4H PRN Sunnie Nielsen, DO   5 mg at 04/09/23 1250   pantoprazole (PROTONIX) EC tablet 40 mg  40 mg Oral BID Cox, Amy N, DO   40 mg at 04/11/23 1002   rifaximin (XIFAXAN) tablet 550 mg  550 mg Oral BID Cox, Amy N, DO   550 mg at 04/11/23 1002   senna-docusate (Senokot-S) tablet 1 tablet  1 tablet Oral QHS PRN Cox, Amy N, DO       trolamine salicylate (ASPERCREME) 10 % cream   Topical PRN Cox, Amy N, DO         Discharge Medications: Please see discharge summary for a list of discharge medications.  Relevant Imaging Results:  Relevant Lab Results:   Additional Information SS [id number]  Allena Katz, LCSW

## 2023-04-11 NOTE — Progress Notes (Signed)
Physical Therapy Treatment Patient Details Name: Joshua Berry MRN: [id number] DOB: 1946/05/02 Today's Date: 04/11/2023   History of Present Illness Patient is a 77 year old male with chief complaint of AMS, found to be septic with infection source unclear. History of [name], liver cirrhosis, hypertension, iron deficiency anemia, chronic thrombocytopenia    PT Comments    Pt was long sitting in bed upon arrival. He requires encouragement but once agreeable does fully participate. No family available to determine baseline cognition. He was unaware that he had had loose BM in bed. Assisted with hygiene care prior to pt getting OOB. He requires increased time to process however was able to roll L to short sit with + mod assist. Stood to RW and took several slow shuffling steps to recliner. Pt does endorse back pain however it did not limit session progression. He will greatly benefit from continued skilled PT at DC to maximize his independence and safety with all ADLs.    Recommendations for follow up therapy are one component of a multi-disciplinary discharge planning process, led by the attending physician.  Recommendations may be updated based on patient status, additional functional criteria and insurance authorization.     Assistance Recommended at Discharge Frequent or constant Supervision/Assistance  Patient can return home with the following A lot of help with walking and/or transfers;A lot of help with bathing/dressing/bathroom;Assistance with cooking/housework;Assistance with feeding;Direct supervision/assist for medications management;Direct supervision/assist for financial management;Assist for transportation;Help with stairs or ramp for entrance   Equipment Recommendations  Other (comment) (defer to next level of care)       Precautions / Restrictions Precautions Precautions: Fall Restrictions Weight Bearing Restrictions: No     Mobility  Bed Mobility Overal bed mobility: Needs  Assistance Bed Mobility: Supine to Sit, Sit to Supine, Rolling Rolling: Min assist Sidelying to sit: Mod assist Supine to sit: Mod assist  General bed mobility comments: Pt was able to roll L to short sit with increased time + mod assist. sat EOB x several minutes while performing several strengthening exercises.    Transfers Overall transfer level: Needs assistance Equipment used: Rolling walker (2 wheels) Transfers: Sit to/from Stand Sit to Stand: Mod assist, From elevated surface  General transfer comment: mod assist to stand from slightly elevated bed height. discussed with RN tech standing from recliner later in the date.    Ambulation/Gait Ambulation/Gait assistance: Min assist Gait Distance (Feet): 5 Feet Assistive device: Rolling walker (2 wheels) Gait Pattern/deviations: Step-to pattern Gait velocity: decreased     General Gait Details: Pt was able to take several steps from EOB to recliner. Took very small shuffling steps but was unwilling to ambulate further distances even though he was encouraged to do so. will continue efforts to advance OOB and standing activity as able per pt tolerance    Balance Overall balance assessment: Needs assistance Sitting-balance support: Feet supported Sitting balance-Leahy Scale: Good Sitting balance - Comments: no LOB sittign EOB x > 5 minutes   Standing balance support: Bilateral upper extremity supported, During functional activity, Reliant on assistive device for balance Standing balance-Leahy Scale: Fair Standing balance comment: reliant on BUE on RW during dynamic standing task       Cognition Arousal/Alertness: Awake/alert Behavior During Therapy: Flat affect Overall Cognitive Status: No family/caregiver present to determine baseline cognitive functioning      General Comments: slow processing, able to follow single step commands with increased time and multi modal cues  General Comments General comments  (skin integrity, edema, etc.): Pt had loose BM prior to getting OOB. he rolled R but required assistance for hygiene care. per staff. has had several loose stools today      Pertinent Vitals/Pain Pain Assessment Pain Assessment: 0-10 Pain Score: 6  Pain Location: "all over" Pain Descriptors / Indicators: Discomfort, Sore Pain Intervention(s): Limited activity within patient's tolerance, Monitored during session, Premedicated before session, Repositioned     PT Goals (current goals can now be found in the care plan section) Acute Rehab PT Goals Patient Stated Goal: none stated Progress towards PT goals: Progressing toward goals    Frequency    Min 3X/week      PT Plan Current plan remains appropriate       AM-PAC PT "6 Clicks" Mobility   Outcome Measure  Help needed turning from your back to your side while in a flat bed without using bedrails?: A Little Help needed moving from lying on your back to sitting on the side of a flat bed without using bedrails?: A Little Help needed moving to and from a bed to a chair (including a wheelchair)?: A Lot Help needed standing up from a chair using your arms (e.g., wheelchair or bedside chair)?: A Lot Help needed to walk in hospital room?: A Lot Help needed climbing 3-5 steps with a railing? : A Lot 6 Click Score: 14    End of Session   Activity Tolerance: Patient tolerated treatment well;Patient limited by fatigue Patient left: in chair;with call bell/phone within reach;with chair alarm set Nurse Communication: Mobility status PT Visit Diagnosis: Unsteadiness on feet (R26.81);Muscle weakness (generalized) (M62.81)     Time: 2130-8657 PT Time Calculation (min) (ACUTE ONLY): 17 min  Charges:  $Therapeutic Activity: 8-22 mins                     Jetta Lout PTA 04/11/23, 12:35 PM

## 2023-04-12 DIAGNOSIS — R7881 Bacteremia: Secondary | ICD-10-CM | POA: Diagnosis not present

## 2023-04-12 DIAGNOSIS — B955 Unspecified streptococcus as the cause of diseases classified elsewhere: Secondary | ICD-10-CM | POA: Diagnosis not present

## 2023-04-12 LAB — BASIC METABOLIC PANEL
Anion gap: 7 (ref 5–15)
BUN: 12 mg/dL (ref 8–23)
CO2: 24 mmol/L (ref 22–32)
Calcium: 8.7 mg/dL — ABNORMAL LOW (ref 8.9–10.3)
Chloride: 103 mmol/L (ref 98–111)
Creatinine, Ser: 0.81 mg/dL (ref 0.61–1.24)
GFR, Estimated: >60 mL/min (ref >60)
Glucose, Bld: 111 mg/dL — ABNORMAL HIGH (ref 70–99)
Potassium: 3.5 mmol/L (ref 3.5–5.1)
Sodium: 134 mmol/L — ABNORMAL LOW (ref 135–145)

## 2023-04-12 LAB — CULTURE, BLOOD (ROUTINE X 2)

## 2023-04-12 LAB — GLUCOSE, CAPILLARY
Glucose-Capillary: 109 mg/dL — ABNORMAL HIGH (ref 70–99)
Glucose-Capillary: 202 mg/dL — ABNORMAL HIGH (ref 70–99)
Glucose-Capillary: 204 mg/dL — ABNORMAL HIGH (ref 70–99)
Glucose-Capillary: 123 mg/dL — ABNORMAL HIGH (ref 70–99)

## 2023-04-12 MED ORDER — TERAZOSIN HCL 5 MG PO CAPS
5.0000 mg | ORAL_CAPSULE | Freq: Every day | ORAL | Status: DC
  Administered 2023-04-12 – 2023-04-16 (×5): 5 mg via ORAL
  Filled 2023-04-12 (×5): qty 1

## 2023-04-12 MED ORDER — FUROSEMIDE 20 MG PO TABS
20.0000 mg | ORAL_TABLET | Freq: Every day | ORAL | Status: DC
  Administered 2023-04-12 – 2023-04-17 (×6): 20 mg via ORAL
  Filled 2023-04-12 (×7): qty 1

## 2023-04-12 NOTE — Progress Notes (Addendum)
PROGRESS NOTE    Joshua Berry   ZOX:[id number] DOB: 12-08-1946  DOA: 04/05/2023 Date of Service: 04/12/23 PCP: [facility]     Brief Narrative / Hospital Course:  Joshua Berry is a 77 year old male with history of NASH liver cirrhosis, hypertension, iron deficiency anemia, chronic thrombocytopenia, who presents from [facility] via EMS on 04/05/23 to emergency department for chief concerns of altered mental status. According to his wife he became progressively altered throughout the day and afternoon yesterday. No recent illnesses, trauma, other acute medical complaints. Last night given his altered mental status did not take his lactulose.  04/20: disoriented to situation but oriented to self. (+)fever, tachypnea, low WBC 3.8 = SIRS/sepsis. Ammonia elevated to 71. Lactic 2.1 --> 1.7. COVID/flu/RSV neg. UA neg for UTI. CXR non-acute. CT head non-acute, (+)chronic microvascular ischemic change and cerebral volume loss. CT C/A/P: liver disease, patent TIPS shunt, severe T12 compression question subacute, no noted infection. Admitted for concern for sepsis / hepatic encephalopathy  04/21: MRI brain non-acute. Ammonia down to 33. BCx (+)strep, change abx to ceftriaxone, Echo ordered, will repeat BCx and ask ID to see tomorrow. PT/OT ordered.  04/22: stable. Repeat BCx. MRI to eval for osteo Tspine given compression fx --> no osteo noted. TTE no concerns. ID saw patient - will need 4 weeks IV abx.  04/23: Cardiology consulted for TEE.  4/24: TEE and mri of right shoulder negative, CT maxillofacial extensive caries, no abscess       Consultants:  Infectious Disease Cardiology    Procedures: none           ASSESSMENT & PLAN:   Principal Problem:   Severe sepsis Active Problems:   Acute hepatic encephalopathy   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   Chronic diastolic CHF (congestive heart failure)   Hypothyroidism    Overweight (BMI 25.0-29.9)   Secondary esophageal varices without bleeding   HLD (hyperlipidemia)   HTN (hypertension)   Physical deconditioning   Hypomagnesemia   Hepatic encephalopathy      Sepsis w/ positive BCx x4 strep, source likely dental infection  Blood cultures (+)strep  Cefepime and vancomycin per pharmacy --> ceftriaxone, ID has transitioned to oral amoxicillin through 5/6  following culture results, most recent cultures from 4/22 are ngtd TEE negative Mri right shoulder negative  ct of maxillofacial no abscess, has extensive caries  Dental caries Known problem. His dentist hesitant to remove teeth given bleeding risk. He was referred to and has seen an oral surgeon in [place]. That surgeon is planning to remove all the patient's teeth. The procedure still needs to be scheduled. [facility]. Unclear what information they want/need and per the [facility] they haven't/won't provide this. I discussed today w/ [facility] dental on call. He advises touching base with patient's outpatient surgeon, says in the [facility] oral surgeon may be able to help.   Compression fracture T-spine MRI showed no infectious process in spine Pain control  Lower abdominal pain Urinary retention BPH Required I/o cath yesterday, able to void overnight, required I/o yesterday. Home alpha blocker wasn't continued on admission, it has now been resumed - monitor scans and cath prn - cont doxazosin   R shoulder pain Mri shows degenerative changes   Acute encephalopathy Likely hepatic encephalopathy + infection  MRI of the brain without contrast no concerns. Mental status improved last two days with up-titration of lactulose and 2 bms yesterday  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 twice daily lactulose 30 g p.o. thrice daily Will re-start home lasix and monitor   Thrombocytopenia D/t  liver disease Has been unable to get dental extractions d/t this per daughter  Monitor Plt, stable at around 50   Hypothyroidism Levothyroxine 100 mcg daily    HTN (hypertension) Atorvastatin 20 mg nightly    IDDM Type 2 SSI ac Basal insulin      DVT prophylaxis: TED hose, no Rx ppx d/t chronic thrombocytopenia  Pertinent IV fluids/nutrition: no continuous IV fluids; regular diet  Central lines / invasive devices: none   Code Status: DNR - DNR, admitting hospitalist Dr Sedalia Muta noted she confirmed this with daughter at bedside 04/20. Daughter states patient has a MOST form  ACP documentation reviewed: 04/06/23, 8:01 AM: none on file    Current Admission Status: inpatient  TOC needs / Dispo plan:  snf Barriers to discharge / significant pending items: snf placement   Family communication: daughter updated telephonically 4/26. Called today, no answer, left message    Subjective / Brief ROS:  Denies pain, tolerating diet      Objective Findings:  Vitals:   04/11/23 2027 04/12/23 0505 04/12/23 0751 04/12/23 1229  BP: (!) 109/57 (!) 107/54 (!) 116/57 118/69  Pulse: 71 60 (!) 57 62  Resp: 18 16 16 16   Temp: 98.8 F (37.1 C) 98.5 F (36.9 C) 98.5 F (36.9 C) 99.1 F (37.3 C)  TempSrc: Oral Oral    SpO2: 97% 95% 99% 97%  Weight:      Height:        Intake/Output Summary (Last 24 hours) at 04/12/2023 1350 Last data filed at 04/12/2023 1000 Gross per 24 hour  Intake 360 ml  Output 1600 ml  Net -1240 ml   Filed Weights   04/05/23 1628  Weight: 104.4 kg    Examination:  NAD Confused CTAB RRR Abdomen soft, non-tender Ext warm, no edema       Scheduled Medications:   acidophilus  1 capsule Oral Daily   amoxicillin  1,000 mg Oral Q8H   ascorbic acid  1,000 mg Oral Daily   atorvastatin  20 mg Oral QHS   famotidine  20 mg Oral Daily   folic acid  1 mg Oral Daily   insulin aspart  0-9 Units Subcutaneous TID WC   insulin aspart  3 Units Subcutaneous TID  WC   insulin glargine-yfgn  10 Units Subcutaneous QHS   lactulose  30 g Oral TID   levothyroxine  100 mcg Oral Q0600   magnesium oxide  800 mg Oral BID   melatonin  10 mg Oral QHS   pantoprazole  40 mg Oral BID   rifaximin  550 mg Oral BID   terazosin  5 mg Oral QHS    Continuous Infusions:    PRN Medications:  oxyCODONE, polyvinyl alcohol, senna-docusate, trolamine salicylate  Antimicrobials from admission:  Anti-infectives (From admission, onward)    Start     Dose/Rate Route Frequency Ordered Stop   04/10/23 2200  amoxicillin (AMOXIL) capsule 1,000 mg        1,000 mg Oral Every 8 hours 04/10/23 1622 04/21/23 2159   04/06/23 2200  vancomycin (VANCOREADY) IVPB 2000 mg/400 mL  Status:  Discontinued        2,000 mg 200 mL/hr over 120 Minutes Intravenous Every 24 hours 04/05/23 2054 04/06/23 1224   04/06/23 1400  cefTRIAXone (ROCEPHIN) 2 g in sodium chloride 0.9 %  100 mL IVPB  Status:  Discontinued        2 g 200 mL/hr over 30 Minutes Intravenous Every 24 hours 04/06/23 1224 04/10/23 1622   04/05/23 2200  rifaximin (XIFAXAN) tablet 550 mg       Note to Pharmacy: Takes with lactulose     550 mg Oral 2 times daily 04/05/23 1800     04/05/23 2200  vancomycin (VANCOCIN) IVPB 1000 mg/200 mL premix       See Hyperspace for full Linked Orders Report.   1,000 mg 200 mL/hr over 60 Minutes Intravenous  Once 04/05/23 2054 04/06/23 0104   04/05/23 2100  vancomycin (VANCOREADY) IVPB 1500 mg/300 mL       See Hyperspace for full Linked Orders Report.   1,500 mg 150 mL/hr over 120 Minutes Intravenous  Once 04/05/23 2054 04/06/23 0456   04/05/23 2100  ceFEPIme (MAXIPIME) 2 g in sodium chloride 0.9 % 100 mL IVPB  Status:  Discontinued        2 g 200 mL/hr over 30 Minutes Intravenous Every 8 hours 04/05/23 2054 04/06/23 1224           Data Reviewed:  I have personally reviewed the following...  CBC: Recent Labs  Lab 04/05/23 1642 04/06/23 0518 04/07/23 0530 04/09/23 0545  04/10/23 0531 04/11/23 0608  WBC 3.8* 5.5 5.1 4.8 4.4 5.0  NEUTROABS 2.9  --   --   --   --   --   HGB 13.1 12.4* 11.9* 11.7* 12.1* 12.8*  HCT 37.4* 35.4* 34.4* 33.0* 34.5* 36.2*  MCV 93.0 93.2 93.5 92.2 93.0 90.7  PLT 37* 31* 32* 46* 50* 57*   Basic Metabolic Panel: Recent Labs  Lab 04/05/23 1642 04/06/23 0518 04/07/23 0530 04/09/23 0545 04/12/23 0437  NA 137 134* 138 135 134*  K 3.8 3.8 3.7 3.4* 3.5  CL 107 106 109 104 103  CO2 22 20* 23 24 24   GLUCOSE 122* 145* 125* 96 111*  BUN 14 17 16 16 12   CREATININE 1.14 1.06 0.99 0.94 0.81  CALCIUM 9.1 8.6* 8.4* 8.3* 8.7*  MG 1.6* 1.8  --   --   --    GFR: Estimated Creatinine Clearance: 96.8 mL/min (by C-G formula based on SCr of 0.81 mg/dL). Liver Function Tests: Recent Labs  Lab 04/05/23 1642 04/07/23 0530  AST 53* 71*  ALT 33 29  ALKPHOS 95 71  BILITOT 2.8* 2.5*  PROT 6.6 5.7*  ALBUMIN 3.2* 2.7*   No results for input(s): "LIPASE", "AMYLASE" in the last 168 hours. Recent Labs  Lab 04/05/23 1642 04/06/23 0947  AMMONIA 71* 33   Coagulation Profile: No results for input(s): "INR", "PROTIME" in the last 168 hours. Cardiac Enzymes: Recent Labs  Lab 04/05/23 1642  CKTOTAL 114   BNP (last 3 results) No results for input(s): "PROBNP" in the last 8760 hours. HbA1C: No results for input(s): "HGBA1C" in the last 72 hours. CBG: Recent Labs  Lab 04/11/23 1137 04/11/23 1539 04/11/23 2045 04/12/23 0748 04/12/23 1223  GLUCAP 212* 175* 160* 109* 202*   Lipid Profile: No results for input(s): "CHOL", "HDL", "LDLCALC", "TRIG", "CHOLHDL", "LDLDIRECT" in the last 72 hours. Thyroid Function Tests: No results for input(s): "TSH", "T4TOTAL", "FREET4", "T3FREE", "THYROIDAB" in the last 72 hours. Anemia Panel: No results for input(s): "VITAMINB12", "FOLATE", "FERRITIN", "TIBC", "IRON", "RETICCTPCT" in the last 72 hours. Most Recent Urinalysis On File:     Component Value Date/Time   COLORURINE YELLOW (A) 04/06/2023  0217  APPEARANCEUR CLEAR (A) 04/06/2023 0217   APPEARANCEUR Hazy 05/01/2014 1653   LABSPEC >1.046 (H) 04/06/2023 0217   LABSPEC 1.015 05/01/2014 1653   PHURINE 5.0 04/06/2023 0217   GLUCOSEU NEGATIVE 04/06/2023 0217   GLUCOSEU Negative 05/01/2014 1653   HGBUR SMALL (A) 04/06/2023 0217   BILIRUBINUR NEGATIVE 04/06/2023 0217   BILIRUBINUR Negative 05/01/2014 1653   KETONESUR NEGATIVE 04/06/2023 0217   PROTEINUR 30 (A) 04/06/2023 0217   NITRITE NEGATIVE 04/06/2023 0217   LEUKOCYTESUR NEGATIVE 04/06/2023 0217   LEUKOCYTESUR Negative 05/01/2014 1653   Sepsis Labs: @LABRCNTIP (procalcitonin:4,lacticidven:4) Microbiology: Recent Results (from the past 240 hour(s))  Resp panel by RT-PCR (RSV, Flu A&B, Covid)     Status: None   Collection Time: 04/05/23  9:24 PM   Specimen: Nasal Swab  Result Value Ref Range Status   SARS Coronavirus 2 by RT PCR NEGATIVE NEGATIVE Final    Comment: (NOTE) SARS-CoV-2 target nucleic acids are NOT DETECTED.  The SARS-CoV-2 RNA is generally detectable in upper respiratory specimens during the acute phase of infection. The lowest concentration of SARS-CoV-2 viral copies this assay can detect is 138 copies/mL. A negative result does not preclude SARS-Cov-2 infection and should not be used as the sole basis for treatment or other patient management decisions. A negative result may occur with  improper specimen collection/handling, submission of specimen other than nasopharyngeal swab, presence of viral mutation(s) within the areas targeted by this assay, and inadequate number of viral copies(<138 copies/mL). A negative result must be combined with clinical observations, patient history, and epidemiological information. The expected result is Negative.  Fact Sheet for Patients:  BloggerCourse.com  Fact Sheet for Healthcare Providers:  SeriousBroker.it  This test is no t yet approved or cleared by the  [place] FDA and  has been authorized for detection and/or diagnosis of SARS-CoV-2 by FDA under an Emergency Use Authorization (EUA). This EUA will remain  in effect (meaning this test can be used) for the duration of the COVID-19 declaration under Section 564(b)(1) of the Act, 21 U.S.C.section 360bbb-3(b)(1), unless the authorization is terminated  or revoked sooner.       Influenza A by PCR NEGATIVE NEGATIVE Final   Influenza B by PCR NEGATIVE NEGATIVE Final    Comment: (NOTE) The Xpert Xpress SARS-CoV-2/FLU/RSV plus assay is intended as an aid in the diagnosis of influenza from Nasopharyngeal swab specimens and should not be used as a sole basis for treatment. Nasal washings and aspirates are unacceptable for Xpert Xpress SARS-CoV-2/FLU/RSV testing.  Fact Sheet for Patients: BloggerCourse.com  Fact Sheet for Healthcare Providers: SeriousBroker.it  This test is not yet approved or cleared by the [place] FDA and has been authorized for detection and/or diagnosis of SARS-CoV-2 by FDA under an Emergency Use Authorization (EUA). This EUA will remain in effect (meaning this test can be used) for the duration of the COVID-19 declaration under Section 564(b)(1) of the Act, 21 U.S.C. section 360bbb-3(b)(1), unless the authorization is terminated or revoked.     Resp Syncytial Virus by PCR NEGATIVE NEGATIVE Final    Comment: (NOTE) Fact Sheet for Patients: BloggerCourse.com  Fact Sheet for Healthcare Providers: SeriousBroker.it  This test is not yet approved or cleared by the [place] FDA and has been authorized for detection and/or diagnosis of SARS-CoV-2 by FDA under an Emergency Use Authorization (EUA). This EUA will remain in effect (meaning this test can be used) for the duration of the COVID-19 declaration under Section 564(b)(1) of the Act, 21 U.S.C. section  360bbb-3(b)(1),  unless the authorization is terminated or revoked.  Performed at [facility], [address] [place], [place] [address]   Culture, blood (Routine X 2) w Reflex to ID Panel     Status: Abnormal   Collection Time: 04/05/23  9:24 PM   Specimen: BLOOD RIGHT ARM  Result Value Ref Range Status   Specimen Description   Final    BLOOD RIGHT ARM Performed at [facility], [address] [place], [place] [address]    Special Requests   Final    BOTTLES DRAWN AEROBIC AND ANAEROBIC Blood Culture adequate volume Performed at [facility], [address] [place], [place] [address]    Culture  Setup Time   Final    GRAM POSITIVE COCCI IN BOTH AEROBIC AND ANAEROBIC BOTTLES CRITICAL RESULT CALLED TO, READ BACK BY AND VERIFIED WITH: CAROLYN COULTER AT 1211 04/06/23.PMF  REVIEWED BY A. LAFRANCE    Culture (A)  Final    STREPTOCOCCUS ANGINOSIS SUSCEPTIBILITIES PERFORMED ON PREVIOUS CULTURE WITHIN THE LAST 5 DAYS. Performed at [facility], [address], [place], [place] [address]    Report Status 04/08/2023 FINAL  Final  Culture, blood (Routine X 2) w Reflex to ID Panel     Status: Abnormal   Collection Time: 04/05/23  9:24 PM   Specimen: BLOOD LEFT ARM  Result Value Ref Range Status   Specimen Description BLOOD LEFT ARM  Final   Special Requests   Final    BOTTLES DRAWN AEROBIC AND ANAEROBIC Blood Culture results may not be optimal due to an inadequate volume of blood received in culture bottles   Culture  Setup Time   Final    GRAM POSITIVE COCCI IN BOTH AEROBIC AND ANAEROBIC BOTTLES CRITICAL RESULT CALLED TO, READ BACK BY AND VERIFIED WITH: CAROLYN COULTER AT 1211 04/06/23.PMF  REVIEWED BY A. LAFRANCE    Culture STREPTOCOCCUS ANGINOSIS (A)  Final   Report Status 04/08/2023 FINAL  Final   Organism ID, Bacteria STREPTOCOCCUS ANGINOSIS  Final      Susceptibility   Streptococcus anginosis - MIC*    PENICILLIN <=0.06 SENSITIVE Sensitive      CEFTRIAXONE <=0.12 SENSITIVE Sensitive     ERYTHROMYCIN 1 RESISTANT Resistant     LEVOFLOXACIN 0.5 SENSITIVE Sensitive     VANCOMYCIN 0.5 SENSITIVE Sensitive     * STREPTOCOCCUS ANGINOSIS  Blood Culture ID Panel (Reflexed)     Status: Abnormal   Collection Time: 04/05/23  9:24 PM  Result Value Ref Range Status   Enterococcus faecalis NOT DETECTED NOT DETECTED Final   Enterococcus Faecium NOT DETECTED NOT DETECTED Final   Listeria monocytogenes NOT DETECTED NOT DETECTED Final   Staphylococcus species NOT DETECTED NOT DETECTED Final   Staphylococcus aureus (BCID) NOT DETECTED NOT DETECTED Final   Staphylococcus epidermidis NOT DETECTED NOT DETECTED Final   Staphylococcus lugdunensis NOT DETECTED NOT DETECTED Final   Streptococcus species DETECTED (A) NOT DETECTED Final    Comment: Not Enterococcus species, Streptococcus agalactiae, Streptococcus pyogenes, or Streptococcus pneumoniae. CRITICAL RESULT CALLED TO, READ BACK BY AND VERIFIED WITH: CAROLYN COULTER AT 1211 04/06/23.PMF    Streptococcus agalactiae NOT DETECTED NOT DETECTED Final   Streptococcus pneumoniae NOT DETECTED NOT DETECTED Final   Streptococcus pyogenes NOT DETECTED NOT DETECTED Final   A.calcoaceticus-baumannii NOT DETECTED NOT DETECTED Final   Bacteroides fragilis NOT DETECTED NOT DETECTED Final   Enterobacterales NOT DETECTED NOT DETECTED Final   Enterobacter cloacae complex NOT DETECTED NOT DETECTED Final  Escherichia coli NOT DETECTED NOT DETECTED Final   Klebsiella aerogenes NOT DETECTED NOT DETECTED Final   Klebsiella oxytoca NOT DETECTED NOT DETECTED Final   Klebsiella pneumoniae NOT DETECTED NOT DETECTED Final   Proteus species NOT DETECTED NOT DETECTED Final   Salmonella species NOT DETECTED NOT DETECTED Final   Serratia marcescens NOT DETECTED NOT DETECTED Final   Haemophilus influenzae NOT DETECTED NOT DETECTED Final   Neisseria meningitidis NOT DETECTED NOT DETECTED Final   Pseudomonas aeruginosa NOT  DETECTED NOT DETECTED Final   Stenotrophomonas maltophilia NOT DETECTED NOT DETECTED Final   Candida albicans NOT DETECTED NOT DETECTED Final   Candida auris NOT DETECTED NOT DETECTED Final   Candida glabrata NOT DETECTED NOT DETECTED Final   Candida krusei NOT DETECTED NOT DETECTED Final   Candida parapsilosis NOT DETECTED NOT DETECTED Final   Candida tropicalis NOT DETECTED NOT DETECTED Final   Cryptococcus neoformans/gattii NOT DETECTED NOT DETECTED Final    Comment: Performed at [facility], [address] [place], [place] [address]  Culture, blood (Routine X 2) w Reflex to ID Panel     Status: None   Collection Time: 04/07/23 12:41 PM   Specimen: BLOOD  Result Value Ref Range Status   Specimen Description BLOOD LEFT ANTECUBITAL  Final   Special Requests   Final    BOTTLES DRAWN AEROBIC AND ANAEROBIC Blood Culture adequate volume   Culture   Final    NO GROWTH 5 DAYS Performed at [facility], [address] [place], [place] [address]    Report Status 04/12/2023 FINAL  Final  Culture, blood (Routine X 2) w Reflex to ID Panel     Status: None   Collection Time: 04/07/23 12:41 PM   Specimen: BLOOD  Result Value Ref Range Status   Specimen Description BLOOD RIGHT ANTECUBITAL  Final   Special Requests   Final    BOTTLES DRAWN AEROBIC AND ANAEROBIC Blood Culture adequate volume   Culture   Final    NO GROWTH 5 DAYS Performed at [facility], [address], [place], [place] [address]    Report Status 04/12/2023 FINAL  Final      Radiology Studies last 3 days: CT MAXILLOFACIAL W CONTRAST  Result Date: 04/09/2023 CLINICAL DATA:  Unclear source of strep bacteremia with concerns for dental infection. EXAM: CT MAXILLOFACIAL WITH CONTRAST TECHNIQUE: Multidetector CT imaging of the maxillofacial structures was performed with intravenous contrast. Multiplanar CT image reconstructions were also generated. RADIATION DOSE REDUCTION: This exam was performed  according to the departmental dose-optimization program which includes automated exposure control, adjustment of the mA and/or kV according to patient size and/or use of iterative reconstruction technique. CONTRAST:  75mL OMNIPAQUE IOHEXOL 300 MG/ML  SOLN COMPARISON:  Maxillofacial CT 01/22/2019. FINDINGS: Osseous: Extensive dental caries and periapical lucencies of the remaining maxillary and mandibular teeth. Fractured right lateral maxillary incisor. Orbits: Negative. No traumatic or inflammatory finding. Sinuses: Well aerated. Soft tissues: Unremarkable. Limited intracranial: Unremarkable. IMPRESSION: Extensive dental caries and periapical lucencies of the remaining maxillary and mandibular teeth. Fractured right lateral maxillary incisor. Electronically Signed   By: Orvan Falconer M.D.   On: 04/09/2023 15:49   ECHO TEE  Result Date: 04/09/2023    TRANSESOPHOGEAL ECHO REPORT   Patient Name:   JAMAINE QUINTIN Fernholz Date of Exam: 04/09/2023 Medical Rec #:  [id number]     Height:       74.0 in Accession #:    [id number]    Weight:  230.2 lb Date of Birth:  [birth year]     BSA:          2.307 m Patient Age:    12 years      BP:           119/54 mmHg Patient Gender: M             HR:           66 bpm. Exam Location:  [facility] Procedure: Transesophageal Echo, Cardiac Doppler and Color Doppler Indications:     Bacteremia R78.81  History:         Patient has prior history of Echocardiogram examinations, most                  recent 04/06/2023. Signs/Symptoms:Murmur; Risk                  Factors:Hypertension and Diabetes. S/P TAVR.                  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  Sonographer:     Cristela Blue Referring Phys:  [id number] BERGE Diagnosing Phys: Debbe Odea MD PROCEDURE: The transesophogeal probe was passed without difficulty through the esophogus of the patient. Sedation performed by performing physician. The patient developed no  complications during the procedure.  IMPRESSIONS  1. Left ventricular ejection fraction, by estimation, is 55 to 60%. The left ventricle has normal function.  2. Right ventricular systolic function is normal. The right ventricular size is normal.  3. No left atrial/left atrial appendage thrombus was detected.  4. The mitral valve is normal in structure. Trivial mitral valve regurgitation.  5. The aortic valve has been repaired/replaced. Aortic valve regurgitation is mild. 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  6. Aortic dilatation noted. There is mild dilatation of the aortic root, measuring 43 mm. Conclusion(s)/Recommendation(s): No evidence of vegetation/infective endocarditis on this transesophageael echocardiogram. FINDINGS  Left Ventricle: Left ventricular ejection fraction, by estimation, is 55 to 60%. The left ventricle has normal function. The left ventricular internal cavity size was normal in size. Right Ventricle: The right ventricular size is normal. No increase in right ventricular wall thickness. Right ventricular systolic function is normal. Left Atrium: Left atrial size was normal in size. No left atrial/left atrial appendage thrombus was detected. Right Atrium: Right atrial size was normal in size. Pericardium: There is no evidence of pericardial effusion. Mitral Valve: The mitral valve is normal in structure. Trivial mitral valve regurgitation. Tricuspid Valve: The tricuspid valve is normal in structure. Tricuspid valve regurgitation is mild. Aortic Valve: The aortic valve has been repaired/replaced. Aortic valve regurgitation is mild. 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 Pulmonic Valve: The pulmonic valve was normal in structure. Pulmonic valve regurgitation is not visualized. Aorta: Aortic dilatation noted. There is mild dilatation of the aortic root, measuring 43 mm. IAS/Shunts: No atrial level shunt detected by color flow Doppler. Debbe Odea MD Electronically signed by Debbe Odea MD Signature Date/Time: 04/09/2023/1:18:56 PM    Final    MR SHOULDER RIGHT WO CONTRAST  Result Date: 04/09/2023 CLINICAL DATA:  Shoulder pain. Septic arthritis suspected. Osteomyelitis suspected. EXAM: MRI OF THE RIGHT SHOULDER WITHOUT CONTRAST TECHNIQUE: Multiplanar, multisequence MR imaging of the shoulder was performed. No intravenous contrast was administered. COMPARISON:  Right shoulder MRI 01/02/2019. FINDINGS: Despite  efforts by the technologist and patient, mild motion artifact is present on today's exam and could not be eliminated. This reduces exam sensitivity and specificity. Rotator cuff: Progressive infraspinatus tendinosis with interval development of a small intrasubstance insertional tear extending to the bursal surface, best seen on coronal image 10/9. No full-thickness component or tendon retraction identified. There is mild supraspinatus tendinosis. The subscapularis and teres minor tendons appear normal. Muscles:  No focal muscular atrophy or edema. Biceps long head: Mild tendinosis. The tendon is intact and normally located. Acromioclavicular Joint: The acromion is type 2. There are mild acromioclavicular degenerative changes. No significant fluid is present in the subacromial - subdeltoid bursa. Glenohumeral Joint: Chronic advanced glenohumeral arthropathy which has progressed from the previous study. There is diffuse chondral thinning with osteophyte and subchondral cyst formation. Subchondral edema in the glenoid has improved compared with the prior study, and no bone destruction is identified. There is a small shoulder joint effusion with most of the fluid anteriorly in the superior subscapularis recess and the bicipital groove. Labrum: Diffusely degenerated with degenerative tearing superiorly and posteriorly. No significant paralabral cyst. Bones: Chronic glenohumeral arthropathy without findings highly suspicious for septic  arthritis or osteomyelitis. Increased subcortical cyst formation in the humeral head near the supraspinatus insertion. Other: No periarticular inflammatory changes or fluid collections are demonstrated to suggest septic arthritis. IMPRESSION: 1. Chronic advanced glenohumeral arthropathy which has progressed from the prior study of 01/02/2019. No specific evidence of septic arthritis or osteomyelitis. 2. Progressive degenerative tearing of the labrum posteriorly and superiorly. 3. Progressive infraspinatus tendinosis with small intrasubstance insertional tear. No full-thickness rotator cuff tear, tendon retraction or focal muscular atrophy. 4. Mild bicipital tendinosis. Electronically Signed   By: Carey Bullocks M.D.   On: 04/09/2023 08:21             LOS: 7 days       Silvano Bilis, MD [facility] 04/12/2023, 1:50 PM     Staff may message me via secure chat in Epic  but this may not receive an immediate response,  please page me for urgent matters!  If 7PM-7AM, please contact night coverage [email or web address]

## 2023-04-12 NOTE — TOC Progression Note (Addendum)
Transition of Care [facility]) - Progression Note    Patient Details  Name: Joshua Berry MRN: [id number] Date of Birth: 15-Sep-1946  Transition of Care [facility]) CM/SW Contact  Allena Katz, LCSW Phone Number: 04/12/2023, 9:42 AM  Clinical Narrative:   CSW spoke with [facility] representative [place] [phone] who states they have reviewed patients request for SNF. [facility] reports [facility] can not [facility] until a dental consult has happened. CSW explained this is not something that can likely be accommodated inpatient and asked for further details on what is needed but [facility] could not provide this. CSW relayed message to MD and wife. Updated progress note faxed to [facility] explaining pt had follow up in [place] regarding his dental care before hospitalization.           Expected Discharge Plan and Services                                               Social Determinants of Health (SDOH) Interventions SDOH Screenings   Food Insecurity: No Food Insecurity (04/06/2023)  Housing: Low Risk  (04/06/2023)  Transportation Needs: No Transportation Needs (04/06/2023)  Utilities: Not At Risk (04/06/2023)  Depression (PHQ2-9): Low Risk  (01/07/2022)  Recent Concern: Depression (PHQ2-9) - Medium Risk (12/04/2021)  Tobacco Use: Medium Risk (04/11/2023)    Readmission Risk Interventions    05/17/2022    1:45 PM  Readmission Risk Prevention Plan  Transportation Screening Complete  PCP or Specialist Appt within 5-7 Days Complete  Home Care Screening Complete  Medication Review (RN CM) Complete

## 2023-04-13 DIAGNOSIS — B955 Unspecified streptococcus as the cause of diseases classified elsewhere: Secondary | ICD-10-CM | POA: Diagnosis not present

## 2023-04-13 DIAGNOSIS — R7881 Bacteremia: Secondary | ICD-10-CM | POA: Diagnosis not present

## 2023-04-13 LAB — GLUCOSE, CAPILLARY
Glucose-Capillary: 123 mg/dL — ABNORMAL HIGH (ref 70–99)
Glucose-Capillary: 186 mg/dL — ABNORMAL HIGH (ref 70–99)
Glucose-Capillary: 146 mg/dL — ABNORMAL HIGH (ref 70–99)
Glucose-Capillary: 126 mg/dL — ABNORMAL HIGH (ref 70–99)

## 2023-04-13 MED ORDER — ACETAMINOPHEN 500 MG PO TABS
500.0000 mg | ORAL_TABLET | Freq: Four times a day (QID) | ORAL | Status: DC | PRN
  Administered 2023-04-13 – 2023-04-17 (×7): 500 mg via ORAL
  Filled 2023-04-13 (×8): qty 1

## 2023-04-13 MED ORDER — LIDOCAINE 5 % EX PTCH
1.0000 | MEDICATED_PATCH | Freq: Every day | CUTANEOUS | Status: DC | PRN
  Administered 2023-04-14 – 2023-04-16 (×3): 1 via TRANSDERMAL
  Filled 2023-04-13 (×4): qty 1

## 2023-04-13 MED ORDER — KETOTIFEN FUMARATE 0.035 % OP SOLN
1.0000 [drp] | Freq: Two times a day (BID) | OPHTHALMIC | Status: DC
  Administered 2023-04-13 – 2023-04-17 (×9): 1 [drp] via OPHTHALMIC
  Filled 2023-04-13: qty 5

## 2023-04-13 MED ORDER — ACETAMINOPHEN 500 MG PO TABS
1000.0000 mg | ORAL_TABLET | Freq: Four times a day (QID) | ORAL | Status: DC | PRN
  Administered 2023-04-13: 1000 mg via ORAL
  Filled 2023-04-13: qty 2

## 2023-04-13 NOTE — Progress Notes (Signed)
PROGRESS NOTE    Joshua Berry   ZOX:[id number] DOB: [birth year]  DOA: 04/05/2023 Date of Service: 04/13/23 [facility]     Brief Narrative / Hospital Course:  Joshua Berry is a 77 year old male with history of NASH liver cirrhosis, hypertension, iron deficiency anemia, chronic thrombocytopenia, who presents from [place] assisted living via EMS on 04/05/23 to emergency department for chief concerns of altered mental status. According to his wife he became progressively altered throughout the day and afternoon yesterday. No recent illnesses, trauma, other acute medical complaints. Last night given his altered mental status did not take his lactulose.  04/20: disoriented to situation but oriented to self. (+)fever, tachypnea, low WBC 3.8 = SIRS/sepsis. Ammonia elevated to 71. Lactic 2.1 --> 1.7. COVID/flu/RSV neg. UA neg for UTI. CXR non-acute. CT head non-acute, (+)chronic microvascular ischemic change and cerebral volume loss. CT C/A/P: liver disease, patent TIPS shunt, severe T12 compression question subacute, no noted infection. Admitted for concern for sepsis / hepatic encephalopathy  04/21: MRI brain non-acute. Ammonia down to 33. BCx (+)strep, change abx to ceftriaxone, Echo ordered, will repeat BCx and ask ID to see tomorrow. PT/OT ordered.  04/22: stable. Repeat BCx. MRI to eval for osteo Tspine given compression fx --> no osteo noted. TTE no concerns. ID saw patient - will need 4 weeks IV abx.  04/23: Cardiology consulted for TEE.  4/24: TEE and mri of right shoulder negative, CT maxillofacial extensive caries, no abscess       Consultants:  Infectious Disease Cardiology    Procedures: none           ASSESSMENT & PLAN:   Principal Problem:   Severe sepsis Active Problems:   Acute hepatic encephalopathy   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   Chronic diastolic CHF (congestive heart failure)   Hypothyroidism    Overweight (BMI 25.0-29.9)   Secondary esophageal varices without bleeding   HLD (hyperlipidemia)   HTN (hypertension)   Physical deconditioning   Hypomagnesemia   Hepatic encephalopathy      Sepsis w/ positive BCx x4 strep, source likely Berry infection  Blood cultures (+)strep  Cefepime and vancomycin per pharmacy --> ceftriaxone, ID has transitioned to oral amoxicillin through 5/6  Most recent (4/22) blood cultres are no growth TEE negative Mri right shoulder negative  ct of maxillofacial no abscess, has extensive caries  Berry caries Known problem. His dentist hesitant to remove teeth given bleeding risk. He was referred to and has seen an oral surgeon in [place]. That surgeon is planning to remove all the patient's teeth. The procedure still needs to be scheduled. Per TOC, the [facility] has [facility] Berry [facility]. Unclear what information they want/need and per the [place] they haven't/won't provide this. I discussed today w/ Joshua Berry on call. He advises touching base with patient's outpatient surgeon, says in the [facility] [place] oral surgeon may be able to help. Joshua Berry is that provider, will attempt to reach him tomorrow   Compression fracture T-spine MRI showed no infectious process in spine Pain control  Lower abdominal pain Urinary retention BPH Required I/o cath yesterday, able to void overnight, required I/o yesterday. 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 TTE/TEE unremarkable as above   Acute encephalopathy Likely hepatic encephalopathy + infection  Resolved. MRI unremarkable  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 twice daily lactulose 30 g p.o. thrice daily Home lasix re-started 4/27   Thrombocytopenia D/t liver  disease Has been unable to get Berry extractions d/t this per daughter  Monitor Plt, stable at around 50   Hypothyroidism Levothyroxine 100 mcg daily    HTN (hypertension) Holding statin given advanced liver disease    IDDM Type 2 SSI ac Basal insulin      DVT prophylaxis: TED hose, no Rx ppx d/t chronic thrombocytopenia  Pertinent IV fluids/nutrition: no continuous IV fluids; regular diet  Central lines / invasive devices: none   Code Status: DNR - DNR, admitting hospitalist Dr Joshua Berry noted she confirmed this with daughter at bedside 04/20. Daughter states patient has a MOST form  ACP documentation reviewed: 04/06/23, 8:01 AM: none on file    Current Admission Status: inpatient  TOC needs / Dispo plan:  snf Barriers to discharge / significant pending items: snf placement   Family communication: daughter updated telephonically 4/26. Called today, no answer, left message    Subjective / Brief ROS:  Denies pain, tolerating diet      Objective Findings:  Vitals:   04/12/23 1618 04/12/23 2013 04/13/23 0421 04/13/23 0815  BP: 129/66 115/63 (!) 102/50 104/61  Pulse: 64 66 66 60  Resp: 16 18 16 18   Temp: 98.7 F (37.1 C) 98.7 F (37.1 C) 98 F (36.7 C) 98.2 F (36.8 C)  TempSrc:  Oral Oral Oral  SpO2: 99% 99% 98% 96%  Weight:      Height:        Intake/Output Summary (Last 24 hours) at 04/13/2023 1234 Last data filed at 04/13/2023 0955 Gross per 24 hour  Intake 480 ml  Output --  Net 480 ml   Filed Weights   04/05/23 1628  Weight: 104.4 kg    Examination:  NAD CTAB RRR Abdomen soft, non-tender Ext warm, no edema       Scheduled Medications:   acidophilus  1 capsule Oral Daily   amoxicillin  1,000 mg Oral Q8H   ascorbic acid  1,000 mg Oral Daily   famotidine  20 mg Oral Daily   folic acid  1 mg Oral Daily   furosemide  20 mg Oral Daily   insulin aspart  0-9 Units Subcutaneous TID WC   insulin aspart  3 Units Subcutaneous TID WC   insulin  glargine-yfgn  10 Units Subcutaneous QHS   ketotifen  1 drop Both Eyes BID   lactulose  30 g Oral TID   levothyroxine  100 mcg Oral Q0600   magnesium oxide  800 mg Oral BID   melatonin  10 mg Oral QHS   pantoprazole  40 mg Oral BID   rifaximin  550 mg Oral BID   terazosin  5 mg Oral QHS    Continuous Infusions:    PRN Medications:  acetaminophen, polyvinyl alcohol, senna-docusate, trolamine salicylate  Antimicrobials from admission:  Anti-infectives (From admission, onward)    Start     Dose/Rate Route Frequency Ordered Stop   04/10/23 2200  amoxicillin (AMOXIL) capsule 1,000 mg        1,000 mg Oral Every 8 hours 04/10/23 1622 04/21/23 2159   04/06/23 2200  vancomycin (VANCOREADY) IVPB 2000 mg/400 mL  Status:  Discontinued        2,000 mg 200 mL/hr over 120 Minutes Intravenous Every 24 hours 04/05/23 2054 04/06/23 1224   04/06/23 1400  cefTRIAXone (ROCEPHIN) 2 g  in sodium chloride 0.9 % 100 mL IVPB  Status:  Discontinued        2 g 200 mL/hr over 30 Minutes Intravenous Every 24 hours 04/06/23 1224 04/10/23 1622   04/05/23 2200  rifaximin (XIFAXAN) tablet 550 mg       Note to Pharmacy: Takes with lactulose     550 mg Oral 2 times daily 04/05/23 1800     04/05/23 2200  vancomycin (VANCOCIN) IVPB 1000 mg/200 mL premix       See Hyperspace for full Linked Orders Report.   1,000 mg 200 mL/hr over 60 Minutes Intravenous  Once 04/05/23 2054 04/06/23 0104   04/05/23 2100  vancomycin (VANCOREADY) IVPB 1500 mg/300 mL       See Hyperspace for full Linked Orders Report.   1,500 mg 150 mL/hr over 120 Minutes Intravenous  Once 04/05/23 2054 04/06/23 0456   04/05/23 2100  ceFEPIme (MAXIPIME) 2 g in sodium chloride 0.9 % 100 mL IVPB  Status:  Discontinued        2 g 200 mL/hr over 30 Minutes Intravenous Every 8 hours 04/05/23 2054 04/06/23 1224           Data Reviewed:  I have personally reviewed the following...  CBC: Recent Labs  Lab 04/07/23 0530 04/09/23 0545  04/10/23 0531 04/11/23 0608  WBC 5.1 4.8 4.4 5.0  HGB 11.9* 11.7* 12.1* 12.8*  HCT 34.4* 33.0* 34.5* 36.2*  MCV 93.5 92.2 93.0 90.7  PLT 32* 46* 50* 57*   Basic Metabolic Panel: Recent Labs  Lab 04/07/23 0530 04/09/23 0545 04/12/23 0437  NA 138 135 134*  K 3.7 3.4* 3.5  CL 109 104 103  CO2 23 24 24   GLUCOSE 125* 96 111*  BUN 16 16 12   CREATININE 0.99 0.94 0.81  CALCIUM 8.4* 8.3* 8.7*   GFR: Estimated Creatinine Clearance: 96.8 mL/min (by C-G formula based on SCr of 0.81 mg/dL). Liver Function Tests: Recent Labs  Lab 04/07/23 0530  AST 71*  ALT 29  ALKPHOS 71  BILITOT 2.5*  PROT 5.7*  ALBUMIN 2.7*   No results for input(s): "LIPASE", "AMYLASE" in the last 168 hours. No results for input(s): "AMMONIA" in the last 168 hours.  Coagulation Profile: No results for input(s): "INR", "PROTIME" in the last 168 hours. Cardiac Enzymes: No results for input(s): "CKTOTAL", "CKMB", "CKMBINDEX", "TROPONINI" in the last 168 hours.  BNP (last 3 results) No results for input(s): "PROBNP" in the last 8760 hours. HbA1C: No results for input(s): "HGBA1C" in the last 72 hours. CBG: Recent Labs  Lab 04/12/23 1223 04/12/23 1615 04/12/23 2015 04/13/23 0813 04/13/23 1159  GLUCAP 202* 204* 123* 123* 186*   Lipid Profile: No results for input(s): "CHOL", "HDL", "LDLCALC", "TRIG", "CHOLHDL", "LDLDIRECT" in the last 72 hours. Thyroid Function Tests: No results for input(s): "TSH", "T4TOTAL", "FREET4", "T3FREE", "THYROIDAB" in the last 72 hours. Anemia Panel: No results for input(s): "VITAMINB12", "FOLATE", "FERRITIN", "TIBC", "IRON", "RETICCTPCT" in the last 72 hours. Most Recent Urinalysis On File:     Component Value Date/Time   COLORURINE YELLOW (A) 04/06/2023 0217   APPEARANCEUR CLEAR (A) 04/06/2023 0217   APPEARANCEUR Hazy 05/01/2014 1653   LABSPEC >1.046 (H) 04/06/2023 0217   LABSPEC 1.015 05/01/2014 1653   PHURINE 5.0 04/06/2023 0217   GLUCOSEU NEGATIVE 04/06/2023  0217   GLUCOSEU Negative 05/01/2014 1653   HGBUR SMALL (A) 04/06/2023 0217   BILIRUBINUR NEGATIVE 04/06/2023 0217   BILIRUBINUR Negative 05/01/2014 1653   KETONESUR NEGATIVE 04/06/2023 0217  PROTEINUR 30 (A) 04/06/2023 0217   NITRITE NEGATIVE 04/06/2023 0217   LEUKOCYTESUR NEGATIVE 04/06/2023 0217   LEUKOCYTESUR Negative 05/01/2014 1653   Sepsis Labs: @LABRCNTIP (procalcitonin:4,lacticidven:4) Microbiology: Recent Results (from the past 240 hour(s))  Resp panel by RT-PCR (RSV, Flu A&B, Covid)     Status: None   Collection Time: 04/05/23  9:24 PM   Specimen: Nasal Swab  Result Value Ref Range Status   SARS Coronavirus 2 by RT PCR NEGATIVE NEGATIVE Final    Comment: (NOTE) SARS-CoV-2 target nucleic acids are NOT DETECTED.  The SARS-CoV-2 RNA is generally detectable in upper respiratory specimens during the acute phase of infection. The lowest concentration of SARS-CoV-2 viral copies this assay can detect is 138 copies/mL. A negative result does not preclude SARS-Cov-2 infection and should not be used as the sole basis for treatment or other patient management decisions. A negative result may occur with  improper specimen collection/handling, submission of specimen other than nasopharyngeal swab, presence of viral mutation(s) within the areas targeted by this assay, and inadequate number of viral copies(<138 copies/mL). A negative result must be combined with clinical observations, patient history, and epidemiological information. The expected result is Negative.  Fact Sheet for Patients:  BloggerCourse.com  Fact Sheet for Healthcare Providers:  SeriousBroker.it  This test is no t yet approved or cleared by the [place] FDA and  has been authorized for detection and/or diagnosis of SARS-CoV-2 by FDA under an Emergency Use Authorization (EUA). This EUA will remain  in effect (meaning this test can be used) for the duration  of the COVID-19 declaration under Section 564(b)(1) of the Act, 21 U.S.C.section 360bbb-3(b)(1), unless the authorization is terminated  or revoked sooner.       Influenza A by PCR NEGATIVE NEGATIVE Final   Influenza B by PCR NEGATIVE NEGATIVE Final    Comment: (NOTE) The Xpert Xpress SARS-CoV-2/FLU/RSV plus assay is intended as an aid in the diagnosis of influenza from Nasopharyngeal swab specimens and should not be used as a sole basis for treatment. Nasal washings and aspirates are unacceptable for Xpert Xpress SARS-CoV-2/FLU/RSV testing.  Fact Sheet for Patients: BloggerCourse.com  Fact Sheet for Healthcare Providers: SeriousBroker.it  This test is not yet approved or cleared by the [place] FDA and has been authorized for detection and/or diagnosis of SARS-CoV-2 by FDA under an Emergency Use Authorization (EUA). This EUA will remain in effect (meaning this test can be used) for the duration of the COVID-19 declaration under Section 564(b)(1) of the Act, 21 U.S.C. section 360bbb-3(b)(1), unless the authorization is terminated or revoked.     Resp Syncytial Virus by PCR NEGATIVE NEGATIVE Final    Comment: (NOTE) Fact Sheet for Patients: BloggerCourse.com  Fact Sheet for Healthcare Providers: SeriousBroker.it  This test is not yet approved or cleared by the [place] FDA and has been authorized for detection and/or diagnosis of SARS-CoV-2 by FDA under an Emergency Use Authorization (EUA). This EUA will remain in effect (meaning this test can be used) for the duration of the COVID-19 declaration under Section 564(b)(1) of the Act, 21 U.S.C. section 360bbb-3(b)(1), unless the authorization is terminated or revoked.  Performed at [facility], [address] [place], [place] [address]   Culture, blood (Routine X 2) w Reflex to ID Panel     Status:  Abnormal   Collection Time: 04/05/23  9:24 PM   Specimen: BLOOD RIGHT ARM  Result Value Ref Range Status   Specimen Description   Final    BLOOD  RIGHT ARM Performed at [facility], [address], [place], [place] [address]    Special Requests   Final    BOTTLES DRAWN AEROBIC AND ANAEROBIC Blood Culture adequate volume Performed at [facility], [address] [place], [place] [address]    Culture  Setup Time   Final    GRAM POSITIVE COCCI IN BOTH AEROBIC AND ANAEROBIC BOTTLES CRITICAL RESULT CALLED TO, READ BACK BY AND VERIFIED WITH: CAROLYN COULTER AT 1211 04/06/23.PMF  REVIEWED BY A. LAFRANCE    Culture (A)  Final    STREPTOCOCCUS ANGINOSIS SUSCEPTIBILITIES PERFORMED ON PREVIOUS CULTURE WITHIN THE LAST 5 DAYS. Performed at [facility], [address], [place], [place] [address]    Report Status 04/08/2023 FINAL  Final  Culture, blood (Routine X 2) w Reflex to ID Panel     Status: Abnormal   Collection Time: 04/05/23  9:24 PM   Specimen: BLOOD LEFT ARM  Result Value Ref Range Status   Specimen Description BLOOD LEFT ARM  Final   Special Requests   Final    BOTTLES DRAWN AEROBIC AND ANAEROBIC Blood Culture results may not be optimal due to an inadequate volume of blood received in culture bottles   Culture  Setup Time   Final    GRAM POSITIVE COCCI IN BOTH AEROBIC AND ANAEROBIC BOTTLES CRITICAL RESULT CALLED TO, READ BACK BY AND VERIFIED WITH: CAROLYN COULTER AT 1211 04/06/23.PMF  REVIEWED BY A. LAFRANCE    Culture STREPTOCOCCUS ANGINOSIS (A)  Final   Report Status 04/08/2023 FINAL  Final   Organism ID, Bacteria STREPTOCOCCUS ANGINOSIS  Final      Susceptibility   Streptococcus anginosis - MIC*    PENICILLIN <=0.06 SENSITIVE Sensitive     CEFTRIAXONE <=0.12 SENSITIVE Sensitive     ERYTHROMYCIN 1 RESISTANT Resistant     LEVOFLOXACIN 0.5 SENSITIVE Sensitive     VANCOMYCIN 0.5 SENSITIVE Sensitive     * STREPTOCOCCUS ANGINOSIS  Blood Culture ID  Panel (Reflexed)     Status: Abnormal   Collection Time: 04/05/23  9:24 PM  Result Value Ref Range Status   Enterococcus faecalis NOT DETECTED NOT DETECTED Final   Enterococcus Faecium NOT DETECTED NOT DETECTED Final   Listeria monocytogenes NOT DETECTED NOT DETECTED Final   Staphylococcus species NOT DETECTED NOT DETECTED Final   Staphylococcus aureus (BCID) NOT DETECTED NOT DETECTED Final   Staphylococcus epidermidis NOT DETECTED NOT DETECTED Final   Staphylococcus lugdunensis NOT DETECTED NOT DETECTED Final   Streptococcus species DETECTED (A) NOT DETECTED Final    Comment: Not Enterococcus species, Streptococcus agalactiae, Streptococcus pyogenes, or Streptococcus pneumoniae. CRITICAL RESULT CALLED TO, READ BACK BY AND VERIFIED WITH: CAROLYN COULTER AT 1211 04/06/23.PMF    Streptococcus agalactiae NOT DETECTED NOT DETECTED Final   Streptococcus pneumoniae NOT DETECTED NOT DETECTED Final   Streptococcus pyogenes NOT DETECTED NOT DETECTED Final   A.calcoaceticus-baumannii NOT DETECTED NOT DETECTED Final   Bacteroides fragilis NOT DETECTED NOT DETECTED Final   Enterobacterales NOT DETECTED NOT DETECTED Final   Enterobacter cloacae complex NOT DETECTED NOT DETECTED Final   Escherichia coli NOT DETECTED NOT DETECTED Final   Klebsiella aerogenes NOT DETECTED NOT DETECTED Final   Klebsiella oxytoca NOT DETECTED NOT DETECTED Final   Klebsiella pneumoniae NOT DETECTED NOT DETECTED Final   Proteus species NOT DETECTED NOT DETECTED Final   Salmonella species NOT DETECTED NOT DETECTED Final   Serratia marcescens NOT DETECTED NOT DETECTED Final   Haemophilus influenzae NOT DETECTED NOT DETECTED  Final   Neisseria meningitidis NOT DETECTED NOT DETECTED Final   Pseudomonas aeruginosa NOT DETECTED NOT DETECTED Final   Stenotrophomonas maltophilia NOT DETECTED NOT DETECTED Final   Candida albicans NOT DETECTED NOT DETECTED Final   Candida auris NOT DETECTED NOT DETECTED Final   Candida glabrata  NOT DETECTED NOT DETECTED Final   Candida krusei NOT DETECTED NOT DETECTED Final   Candida parapsilosis NOT DETECTED NOT DETECTED Final   Candida tropicalis NOT DETECTED NOT DETECTED Final   Cryptococcus neoformans/gattii NOT DETECTED NOT DETECTED Final    Comment: Performed at [facility], [address] [place], [place] [address]  Culture, blood (Routine X 2) w Reflex to ID Panel     Status: None   Collection Time: 04/07/23 12:41 PM   Specimen: BLOOD  Result Value Ref Range Status   Specimen Description BLOOD LEFT ANTECUBITAL  Final   Special Requests   Final    BOTTLES DRAWN AEROBIC AND ANAEROBIC Blood Culture adequate volume   Culture   Final    NO GROWTH 5 DAYS Performed at [facility], [address], [place], [place] [address]    Report Status 04/12/2023 FINAL  Final  Culture, blood (Routine X 2) w Reflex to ID Panel     Status: None   Collection Time: 04/07/23 12:41 PM   Specimen: BLOOD  Result Value Ref Range Status   Specimen Description BLOOD RIGHT ANTECUBITAL  Final   Special Requests   Final    BOTTLES DRAWN AEROBIC AND ANAEROBIC Blood Culture adequate volume   Culture   Final    NO GROWTH 5 DAYS Performed at [facility], [address], [place], [place] [address]    Report Status 04/12/2023 FINAL  Final      Radiology Studies last 3 days: CT MAXILLOFACIAL W CONTRAST  Result Date: 04/09/2023 CLINICAL DATA:  Unclear source of strep bacteremia with concerns for Berry infection. EXAM: CT MAXILLOFACIAL WITH CONTRAST TECHNIQUE: Multidetector CT imaging of the maxillofacial structures was performed with intravenous contrast. Multiplanar CT image reconstructions were also generated. RADIATION DOSE REDUCTION: This exam was performed according to the departmental dose-optimization program which includes automated exposure control, adjustment of the mA and/or kV according to patient size and/or use of iterative reconstruction technique.  CONTRAST:  75mL OMNIPAQUE IOHEXOL 300 MG/ML  SOLN COMPARISON:  Maxillofacial CT 01/22/2019. FINDINGS: Osseous: Extensive Berry caries and periapical lucencies of the remaining maxillary and mandibular teeth. Fractured right lateral maxillary incisor. Orbits: Negative. No traumatic or inflammatory finding. Sinuses: Well aerated. Soft tissues: Unremarkable. Limited intracranial: Unremarkable. IMPRESSION: Extensive Berry caries and periapical lucencies of the remaining maxillary and mandibular teeth. Fractured right lateral maxillary incisor. Electronically Signed   By: Orvan Falconer M.D.   On: 04/09/2023 15:49             LOS: 8 days       Silvano Bilis, MD [facility] 04/13/2023, 12:34 PM     Staff may message me via secure chat in Epic  but this may not receive an immediate response,  please page me for urgent matters!  If 7PM-7AM, please contact night coverage [email or web address]

## 2023-04-14 DIAGNOSIS — B955 Unspecified streptococcus as the cause of diseases classified elsewhere: Secondary | ICD-10-CM | POA: Diagnosis not present

## 2023-04-14 DIAGNOSIS — R7881 Bacteremia: Secondary | ICD-10-CM | POA: Diagnosis not present

## 2023-04-14 LAB — CBC
HCT: 32.7 % — ABNORMAL LOW (ref 39.0–52.0)
Hemoglobin: 11.5 g/dL — ABNORMAL LOW (ref 13.0–17.0)
MCH: 32.7 pg (ref 26.0–34.0)
MCHC: 35.2 g/dL (ref 30.0–36.0)
MCV: 92.9 fL (ref 80.0–100.0)
Platelets: 58 10*3/uL — ABNORMAL LOW (ref 150–400)
RBC: 3.52 MIL/uL — ABNORMAL LOW (ref 4.22–5.81)
RDW: 15.4 % (ref 11.5–15.5)
WBC: 2.7 10*3/uL — ABNORMAL LOW (ref 4.0–10.5)
nRBC: 0 % (ref 0.0–0.2)

## 2023-04-14 LAB — BASIC METABOLIC PANEL
Anion gap: 7 (ref 5–15)
BUN: 15 mg/dL (ref 8–23)
CO2: 25 mmol/L (ref 22–32)
Calcium: 8.6 mg/dL — ABNORMAL LOW (ref 8.9–10.3)
Chloride: 105 mmol/L (ref 98–111)
Creatinine, Ser: 0.89 mg/dL (ref 0.61–1.24)
GFR, Estimated: >60 mL/min (ref >60)
Glucose, Bld: 109 mg/dL — ABNORMAL HIGH (ref 70–99)
Potassium: 3.6 mmol/L (ref 3.5–5.1)
Sodium: 137 mmol/L (ref 135–145)

## 2023-04-14 LAB — GLUCOSE, CAPILLARY
Glucose-Capillary: 112 mg/dL — ABNORMAL HIGH (ref 70–99)
Glucose-Capillary: 165 mg/dL — ABNORMAL HIGH (ref 70–99)
Glucose-Capillary: 139 mg/dL — ABNORMAL HIGH (ref 70–99)
Glucose-Capillary: 189 mg/dL — ABNORMAL HIGH (ref 70–99)

## 2023-04-14 NOTE — Progress Notes (Signed)
Occupational Therapy Treatment Patient Details Name: Joshua Berry MRN: [id number] DOB: Feb 10, 1946 Today's Date: 04/14/2023   History of present illness Patient is a 77 year old male with chief complaint of AMS, found to be septic with infection source unclear. History of Joshua Berry, liver cirrhosis, hypertension, iron deficiency anemia, chronic thrombocytopenia   OT comments  Pt received supine in bed with gown doffed and thrown on the floor. Agreeable to OT with encouragement. Pt endorsed 8/10 back pain at rest (RN notified). He required Max A for supine to sit. Once sitting EOB, pt completed UB dressing with Min A. Pt's bed found to be soiled with urine and seemed to be unaware. Pt then stood from elevated EOB with Mod A and took steps toward recliner with Min A using a RW. Pt left sitting up in recliner with all needs in reach. Pt is making progress toward goal completion. D/C recommendation remains appropriate. OT will continue to follow acutely.    Recommendations for follow up therapy are one component of a multi-disciplinary discharge planning process, led by the attending physician.  Recommendations may be updated based on patient status, additional functional criteria and insurance authorization.    Assistance Recommended at Discharge Frequent or constant Supervision/Assistance  Patient can return home with the following  A lot of help with walking and/or transfers;Assistance with cooking/housework;Assist for transportation;Help with stairs or ramp for entrance;Direct supervision/assist for medications management;A lot of help with bathing/dressing/bathroom   Equipment Recommendations  Other (comment) (defer to next venue of care)    Recommendations for Other Services      Precautions / Restrictions Precautions Precautions: Fall Restrictions Weight Bearing Restrictions: No       Mobility Bed Mobility Overal bed mobility: Needs Assistance Bed Mobility: Supine to Sit     Supine  to sit: Max assist          Transfers Overall transfer level: Needs assistance Equipment used: Rolling walker (2 wheels) Transfers: Sit to/from Stand Sit to Stand: Mod assist, From elevated surface                 Balance Overall balance assessment: Needs assistance Sitting-balance support: Feet supported Sitting balance-Leahy Scale: Good     Standing balance support: Bilateral upper extremity supported, During functional activity, Reliant on assistive device for balance Standing balance-Leahy Scale: Fair                             ADL either performed or assessed with clinical judgement   ADL Overall ADL's : Needs assistance/impaired                 Upper Body Dressing : Minimal assistance;Sitting Upper Body Dressing Details (indicate cue type and reason): to don gown     Toilet Transfer: Moderate assistance;Rolling walker (2 wheels);Cueing for safety;Cueing for sequencing;Minimal assistance Toilet Transfer Details (indicate cue type and reason): simulated         Functional mobility during ADLs: Minimal assistance;Rolling walker (2 wheels);Cueing for safety;Cueing for sequencing (~21ft from EOB>recliner)      Extremity/Trunk Assessment Upper Extremity Assessment Upper Extremity Assessment: Generalized weakness   Lower Extremity Assessment Lower Extremity Assessment: Generalized weakness        Vision Baseline Vision/History: 1 Wears glasses Patient Visual Report: No change from baseline     Perception     Praxis      Cognition Arousal/Alertness: Awake/alert Behavior During Therapy: Flat affect Overall Cognitive Status: No family/caregiver  present to determine baseline cognitive functioning       General Comments: slow processing, able to follow single step commands with increased time and multi modal cues        Exercises      Shoulder Instructions       General Comments      Pertinent Vitals/ Pain       Pain  Assessment Pain Assessment: 0-10 Pain Score: 8  Pain Location: back Pain Descriptors / Indicators: Discomfort, Sore, Grimacing Pain Intervention(s): Limited activity within patient's tolerance, Monitored during session, Repositioned, Patient requesting pain meds-RN notified  Home Living        Prior Functioning/Environment              Frequency  Min 1X/week        Progress Toward Goals  OT Goals(current goals can now be found in the care plan section)  Progress towards OT goals: Progressing toward goals  Acute Rehab OT Goals Patient Stated Goal: return home OT Goal Formulation: With patient Time For Goal Achievement: 04/21/23 Potential to Achieve Goals: Fair  Plan Discharge plan remains appropriate;Frequency remains appropriate    Co-evaluation                 AM-PAC OT "6 Clicks" Daily Activity     Outcome Measure   Help from another person eating meals?: None Help from another person taking care of personal grooming?: A Little Help from another person toileting, which includes using toliet, bedpan, or urinal?: A Lot Help from another person bathing (including washing, rinsing, drying)?: A Lot Help from another person to put on and taking off regular upper body clothing?: A Little Help from another person to put on and taking off regular lower body clothing?: A Lot 6 Click Score: 16    End of Session Equipment Utilized During Treatment: Gait belt;Rolling walker (2 wheels)  OT Visit Diagnosis: Unsteadiness on feet (R26.81);Muscle weakness (generalized) (M62.81);Pain   Activity Tolerance Patient tolerated treatment well   Patient Left in chair;with call bell/phone within reach;with chair alarm set   Nurse Communication Mobility status        Time: 6010-9323 OT Time Calculation (min): 18 min  Charges: OT General Charges $OT Visit: 1 Visit OT Treatments $Self Care/Home Management : 8-22 mins  [facility] MS, OTR/L ascom [phone]   04/14/23, 5:07 PM

## 2023-04-14 NOTE — Progress Notes (Signed)
PROGRESS NOTE    Joshua Berry   WUJ:[id number] DOB: 1946-04-04  DOA: 04/05/2023 Date of Service: 04/14/23 PCP: [facility]     Brief Narrative / Hospital Course:  Joshua Berry is a 77 year old male with history of NASH liver cirrhosis, hypertension, iron deficiency anemia, chronic thrombocytopenia, who presents from [facility] assisted living via EMS on 04/05/23 to emergency department for chief concerns of altered mental status. According to his wife he became progressively altered throughout the day and afternoon yesterday. No recent illnesses, trauma, other acute medical complaints. Last night given his altered mental status did not take his lactulose.  04/20: disoriented to situation but oriented to self. (+)fever, tachypnea, low WBC 3.8 = SIRS/sepsis. Ammonia elevated to 71. Lactic 2.1 --> 1.7. COVID/flu/RSV neg. UA neg for UTI. CXR non-acute. CT head non-acute, (+)chronic microvascular ischemic change and cerebral volume loss. CT C/A/P: liver disease, patent TIPS shunt, severe T12 compression question subacute, no noted infection. Admitted for concern for sepsis / hepatic encephalopathy  04/21: MRI brain non-acute. Ammonia down to 33. BCx (+)strep, change abx to ceftriaxone, Echo ordered, will repeat BCx and ask ID to see tomorrow. PT/OT ordered.  04/22: stable. Repeat BCx. MRI to eval for osteo Tspine given compression fx --> no osteo noted. TTE no concerns. ID saw patient - will need 4 weeks IV abx.  04/23: Cardiology consulted for TEE.  4/24: TEE and mri of right shoulder negative, CT maxillofacial extensive caries, no abscess       Consultants:  Infectious Disease Cardiology    Procedures: none           ASSESSMENT & PLAN:   Principal Problem:   Severe sepsis Active Problems:   Acute hepatic encephalopathy   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   Chronic diastolic CHF (congestive heart failure)   Hypothyroidism    Overweight (BMI 25.0-29.9)   Secondary esophageal varices without bleeding   HLD (hyperlipidemia)   HTN (hypertension)   Physical deconditioning   Hypomagnesemia   Hepatic encephalopathy      Sepsis w/ positive BCx x4 strep, source likely dental infection  Blood cultures (+)strep  Cefepime and vancomycin per pharmacy --> ceftriaxone, ID has transitioned to oral amoxicillin through 5/6  Most recent (4/22) blood cultres are no growth TEE negative Mri right shoulder negative  ct of maxillofacial no abscess, has extensive caries and one fractured incisor  Dental caries Known problem. His dentist hesitant to remove teeth given bleeding risk. He was referred to and has seen an oral surgeon in [place]. That surgeon is planning to remove all the patient's teeth. The procedure still needs to be scheduled. [facility] Unclear what information they want/need and per the [facility] they haven't/won't provide this. I discussed today w/ Joshua Berry dental on call. He advises touching base with patient's outpatient surgeon, says in the [facility] oral surgeon may be able to help. Joshua Berry is that provider, called his office today, awaiting call back.   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 TTE/TEE unremarkable as above   Acute encephalopathy Likely hepatic encephalopathy + infection  Resolved. MRI unremarkable    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 rifaximin  550 mg p.o. twice daily lactulose 30 g p.o. thrice daily Home lasix re-started 4/27   Thrombocytopenia D/t liver disease Has been unable to get dental extractions d/t  this per daughter  Monitor Plt, stable at around 50   Hypothyroidism Levothyroxine 100 mcg daily    HTN (hypertension) Holding statin given advanced liver disease    IDDM Type 2 SSI ac Basal insulin      DVT prophylaxis: TED hose, no Rx ppx d/t chronic thrombocytopenia  Pertinent IV fluids/nutrition: no continuous IV fluids; regular diet  Central lines / invasive devices: none   Code Status: DNR - DNR, admitting hospitalist Dr Sedalia Muta noted she confirmed this with daughter at bedside 04/20. Daughter states patient has a MOST form  ACP documentation reviewed: 04/06/23, 8:01 AM: none on file    Current Admission Status: inpatient  TOC needs / Dispo plan:  snf Barriers to discharge / significant pending items: snf placement   Family communication: daughter updated @ bedside 4/29    Subjective / Brief ROS:  Denies pain other than chronic shoulder pain, tolerating diet, voiding, having BMs      Objective Findings:  Vitals:   04/13/23 2020 04/14/23 0355 04/14/23 0603 04/14/23 0735  BP: (!) 91/53 (!) 98/53 (!) 100/59 110/63  Pulse: 64 (!) 58  61  Resp: 18 18  18   Temp: 98.4 F (36.9 C) 98.8 F (37.1 C)  98 F (36.7 C)  TempSrc: Oral Oral  Oral  SpO2: 97% 93%  99%  Weight:      Height:        Intake/Output Summary (Last 24 hours) at 04/14/2023 1244 Last data filed at 04/14/2023 1100 Gross per 24 hour  Intake 120 ml  Output 1150 ml  Net -1030 ml   Filed Weights   04/05/23 1628  Weight: 104.4 kg    Examination:  NAD CTAB RRR Abdomen soft, non-tender Ext warm, no edema       Scheduled Medications:   acidophilus  1 capsule Oral Daily   amoxicillin  1,000 mg Oral Q8H   ascorbic acid  1,000 mg Oral Daily   famotidine  20 mg Oral Daily   folic acid  1 mg Oral Daily   furosemide  20 mg Oral Daily   insulin aspart  0-9 Units Subcutaneous TID WC   insulin aspart  3 Units Subcutaneous TID WC   insulin glargine-yfgn  10 Units Subcutaneous QHS    ketotifen  1 drop Both Eyes BID   lactulose  30 g Oral TID   levothyroxine  100 mcg Oral Q0600   magnesium oxide  800 mg Oral BID   melatonin  10 mg Oral QHS   pantoprazole  40 mg Oral BID   rifaximin  550 mg Oral BID   terazosin  5 mg Oral QHS    Continuous Infusions:    PRN Medications:  acetaminophen, lidocaine, polyvinyl alcohol, senna-docusate, trolamine salicylate  Antimicrobials from admission:  Anti-infectives (From admission, onward)    Start     Dose/Rate Route Frequency Ordered Stop   04/10/23 2200  amoxicillin (AMOXIL) capsule 1,000 mg        1,000 mg Oral Every 8 hours 04/10/23 1622 04/21/23 2159   04/06/23 2200  vancomycin (VANCOREADY) IVPB 2000 mg/400 mL  Status:  Discontinued        2,000 mg 200 mL/hr over 120 Minutes Intravenous Every 24 hours 04/05/23 2054 04/06/23 1224   04/06/23 1400  cefTRIAXone (ROCEPHIN) 2 g in sodium chloride  0.9 % 100 mL IVPB  Status:  Discontinued        2 g 200 mL/hr over 30 Minutes Intravenous Every 24 hours 04/06/23 1224 04/10/23 1622   04/05/23 2200  rifaximin (XIFAXAN) tablet 550 mg       Note to Pharmacy: Takes with lactulose     550 mg Oral 2 times daily 04/05/23 1800     04/05/23 2200  vancomycin (VANCOCIN) IVPB 1000 mg/200 mL premix       See Hyperspace for full Linked Orders Report.   1,000 mg 200 mL/hr over 60 Minutes Intravenous  Once 04/05/23 2054 04/06/23 0104   04/05/23 2100  vancomycin (VANCOREADY) IVPB 1500 mg/300 mL       See Hyperspace for full Linked Orders Report.   1,500 mg 150 mL/hr over 120 Minutes Intravenous  Once 04/05/23 2054 04/06/23 0456   04/05/23 2100  ceFEPIme (MAXIPIME) 2 g in sodium chloride 0.9 % 100 mL IVPB  Status:  Discontinued        2 g 200 mL/hr over 30 Minutes Intravenous Every 8 hours 04/05/23 2054 04/06/23 1224           Data Reviewed:  I have personally reviewed the following...  CBC: Recent Labs  Lab 04/09/23 0545 04/10/23 0531 04/11/23 0608 04/14/23 0549  WBC 4.8 4.4  5.0 2.7*  HGB 11.7* 12.1* 12.8* 11.5*  HCT 33.0* 34.5* 36.2* 32.7*  MCV 92.2 93.0 90.7 92.9  PLT 46* 50* 57* 58*   Basic Metabolic Panel: Recent Labs  Lab 04/09/23 0545 04/12/23 0437 04/14/23 0549  NA 135 134* 137  K 3.4* 3.5 3.6  CL 104 103 105  CO2 24 24 25   GLUCOSE 96 111* 109*  BUN 16 12 15   CREATININE 0.94 0.81 0.89  CALCIUM 8.3* 8.7* 8.6*   GFR: Estimated Creatinine Clearance: 88.1 mL/min (by C-G formula based on SCr of 0.89 mg/dL). Liver Function Tests: No results for input(s): "AST", "ALT", "ALKPHOS", "BILITOT", "PROT", "ALBUMIN" in the last 168 hours.  No results for input(s): "LIPASE", "AMYLASE" in the last 168 hours. No results for input(s): "AMMONIA" in the last 168 hours.  Coagulation Profile: No results for input(s): "INR", "PROTIME" in the last 168 hours. Cardiac Enzymes: No results for input(s): "CKTOTAL", "CKMB", "CKMBINDEX", "TROPONINI" in the last 168 hours.  BNP (last 3 results) No results for input(s): "PROBNP" in the last 8760 hours. HbA1C: No results for input(s): "HGBA1C" in the last 72 hours. CBG: Recent Labs  Lab 04/13/23 1159 04/13/23 1546 04/13/23 2023 04/14/23 0725 04/14/23 1153  GLUCAP 186* 146* 126* 112* 165*   Lipid Profile: No results for input(s): "CHOL", "HDL", "LDLCALC", "TRIG", "CHOLHDL", "LDLDIRECT" in the last 72 hours. Thyroid Function Tests: No results for input(s): "TSH", "T4TOTAL", "FREET4", "T3FREE", "THYROIDAB" in the last 72 hours. Anemia Panel: No results for input(s): "VITAMINB12", "FOLATE", "FERRITIN", "TIBC", "IRON", "RETICCTPCT" in the last 72 hours. Most Recent Urinalysis On File:     Component Value Date/Time   COLORURINE YELLOW (A) 04/06/2023 0217   APPEARANCEUR CLEAR (A) 04/06/2023 0217   APPEARANCEUR Hazy 05/01/2014 1653   LABSPEC >1.046 (H) 04/06/2023 0217   LABSPEC 1.015 05/01/2014 1653   PHURINE 5.0 04/06/2023 0217   GLUCOSEU NEGATIVE 04/06/2023 0217   GLUCOSEU Negative 05/01/2014 1653   HGBUR  SMALL (A) 04/06/2023 0217   BILIRUBINUR NEGATIVE 04/06/2023 0217   BILIRUBINUR Negative 05/01/2014 1653   KETONESUR NEGATIVE 04/06/2023 0217   PROTEINUR 30 (A) 04/06/2023 0217   NITRITE NEGATIVE 04/06/2023 0217  LEUKOCYTESUR NEGATIVE 04/06/2023 0217   LEUKOCYTESUR Negative 05/01/2014 1653   Sepsis Labs: @LABRCNTIP (procalcitonin:4,lacticidven:4) Microbiology: Recent Results (from the past 240 hour(s))  Resp panel by RT-PCR (RSV, Flu A&B, Covid)     Status: None   Collection Time: 04/05/23  9:24 PM   Specimen: Nasal Swab  Result Value Ref Range Status   SARS Coronavirus 2 by RT PCR NEGATIVE NEGATIVE Final    Comment: (NOTE) SARS-CoV-2 target nucleic acids are NOT DETECTED.  The SARS-CoV-2 RNA is generally detectable in upper respiratory specimens during the acute phase of infection. The lowest concentration of SARS-CoV-2 viral copies this assay can detect is 138 copies/mL. A negative result does not preclude SARS-Cov-2 infection and should not be used as the sole basis for treatment or other patient management decisions. A negative result may occur with  improper specimen collection/handling, submission of specimen other than nasopharyngeal swab, presence of viral mutation(s) within the areas targeted by this assay, and inadequate number of viral copies(<138 copies/mL). A negative result must be combined with clinical observations, patient history, and epidemiological information. The expected result is Negative.  Fact Sheet for Patients:  BloggerCourse.com  Fact Sheet for Healthcare Providers:  SeriousBroker.it  This test is no t yet approved or cleared by the [place] FDA and  has been authorized for detection and/or diagnosis of SARS-CoV-2 by FDA under an Emergency Use Authorization (EUA). This EUA will remain  in effect (meaning this test can be used) for the duration of the COVID-19 declaration under Section  564(b)(1) of the Act, 21 U.S.C.section 360bbb-3(b)(1), unless the authorization is terminated  or revoked sooner.       Influenza A by PCR NEGATIVE NEGATIVE Final   Influenza B by PCR NEGATIVE NEGATIVE Final    Comment: (NOTE) The Xpert Xpress SARS-CoV-2/FLU/RSV plus assay is intended as an aid in the diagnosis of influenza from Nasopharyngeal swab specimens and should not be used as a sole basis for treatment. Nasal washings and aspirates are unacceptable for Xpert Xpress SARS-CoV-2/FLU/RSV testing.  Fact Sheet for Patients: BloggerCourse.com  Fact Sheet for Healthcare Providers: SeriousBroker.it  This test is not yet approved or cleared by the [place] FDA and has been authorized for detection and/or diagnosis of SARS-CoV-2 by FDA under an Emergency Use Authorization (EUA). This EUA will remain in effect (meaning this test can be used) for the duration of the COVID-19 declaration under Section 564(b)(1) of the Act, 21 U.S.C. section 360bbb-3(b)(1), unless the authorization is terminated or revoked.     Resp Syncytial Virus by PCR NEGATIVE NEGATIVE Final    Comment: (NOTE) Fact Sheet for Patients: BloggerCourse.com  Fact Sheet for Healthcare Providers: SeriousBroker.it  This test is not yet approved or cleared by the [place] FDA and has been authorized for detection and/or diagnosis of SARS-CoV-2 by FDA under an Emergency Use Authorization (EUA). This EUA will remain in effect (meaning this test can be used) for the duration of the COVID-19 declaration under Section 564(b)(1) of the Act, 21 U.S.C. section 360bbb-3(b)(1), unless the authorization is terminated or revoked.  Performed at [facility], [address] [place], [place] [address]   Culture, blood (Routine X 2) w Reflex to ID Panel     Status: Abnormal   Collection Time: 04/05/23  9:24 PM    Specimen: BLOOD RIGHT ARM  Result Value Ref Range Status   Specimen Description   Final    BLOOD RIGHT ARM Performed at [facility], [address] [place],  [place] [address]    Special Requests   Final    BOTTLES DRAWN AEROBIC AND ANAEROBIC Blood Culture adequate volume Performed at [facility], [address] [place], [place] [address]    Culture  Setup Time   Final    GRAM POSITIVE COCCI IN BOTH AEROBIC AND ANAEROBIC BOTTLES CRITICAL RESULT CALLED TO, READ BACK BY AND VERIFIED WITH: CAROLYN COULTER AT 1211 04/06/23.PMF  REVIEWED BY A. LAFR[place]    Culture (A)  Final    STREPTOCOCCUS ANGINOSIS SUSCEPTIBILITIES PERFORMED ON PREVIOUS CULTURE WITHIN THE LAST 5 DAYS. Performed at [facility], [address] [place], [place] [address]    Report Status 04/08/2023 FINAL  Final  Culture, blood (Routine X 2) w Reflex to ID Panel     Status: Abnormal   Collection Time: 04/05/23  9:24 PM   Specimen: BLOOD LEFT ARM  Result Value Ref Range Status   Specimen Description BLOOD LEFT ARM  Final   Special Requests   Final    BOTTLES DRAWN AEROBIC AND ANAEROBIC Blood Culture results may not be optimal due to an inadequate volume of blood received in culture bottles   Culture  Setup Time   Final    GRAM POSITIVE COCCI IN BOTH AEROBIC AND ANAEROBIC BOTTLES CRITICAL RESULT CALLED TO, READ BACK BY AND VERIFIED WITH: CAROLYN COULTER AT 1211 04/06/23.PMF  REVIEWED BY A. [place]    Culture STREPTOCOCCUS ANGINOSIS (A)  Final   Report Status 04/08/2023 FINAL  Final   Organism ID, Bacteria STREPTOCOCCUS ANGINOSIS  Final      Susceptibility   Streptococcus anginosis - MIC*    PENICILLIN <=0.06 SENSITIVE Sensitive     CEFTRIAXONE <=0.12 SENSITIVE Sensitive     ERYTHROMYCIN 1 RESISTANT Resistant     LEVOFLOXACIN 0.5 SENSITIVE Sensitive     VANCOMYCIN 0.5 SENSITIVE Sensitive     * STREPTOCOCCUS ANGINOSIS  Blood Culture ID Panel (Reflexed)     Status: Abnormal   Collection  Time: 04/05/23  9:24 PM  Result Value Ref Range Status   Enterococcus faecalis NOT DETECTED NOT DETECTED Final   Enterococcus Faecium NOT DETECTED NOT DETECTED Final   Listeria monocytogenes NOT DETECTED NOT DETECTED Final   Staphylococcus species NOT DETECTED NOT DETECTED Final   Staphylococcus aureus (BCID) NOT DETECTED NOT DETECTED Final   Staphylococcus epidermidis NOT DETECTED NOT DETECTED Final   Staphylococcus lugdunensis NOT DETECTED NOT DETECTED Final   Streptococcus species DETECTED (A) NOT DETECTED Final    Comment: Not Enterococcus species, Streptococcus agalactiae, Streptococcus pyogenes, or Streptococcus pneumoniae. CRITICAL RESULT CALLED TO, READ BACK BY AND VERIFIED WITH: CAROLYN COULTER AT 1211 04/06/23.PMF    Streptococcus agalactiae NOT DETECTED NOT DETECTED Final   Streptococcus pneumoniae NOT DETECTED NOT DETECTED Final   Streptococcus pyogenes NOT DETECTED NOT DETECTED Final   A.calcoaceticus-baumannii NOT DETECTED NOT DETECTED Final   Bacteroides fragilis NOT DETECTED NOT DETECTED Final   Enterobacterales NOT DETECTED NOT DETECTED Final   Enterobacter cloacae complex NOT DETECTED NOT DETECTED Final   Escherichia coli NOT DETECTED NOT DETECTED Final   Klebsiella aerogenes NOT DETECTED NOT DETECTED Final   Klebsiella oxytoca NOT DETECTED NOT DETECTED Final   Klebsiella pneumoniae NOT DETECTED NOT DETECTED Final   Proteus species NOT DETECTED NOT DETECTED Final   Salmonella species NOT DETECTED NOT DETECTED Final   Serratia marcescens NOT DETECTED NOT DETECTED Final   Haemophilus influenzae NOT DETECTED NOT DETECTED Final   Neisseria meningitidis NOT DETECTED NOT DETECTED Final   Pseudomonas  aeruginosa NOT DETECTED NOT DETECTED Final   Stenotrophomonas maltophilia NOT DETECTED NOT DETECTED Final   Candida albicans NOT DETECTED NOT DETECTED Final   Candida auris NOT DETECTED NOT DETECTED Final   Candida glabrata NOT DETECTED NOT DETECTED Final   Candida krusei NOT  DETECTED NOT DETECTED Final   Candida parapsilosis NOT DETECTED NOT DETECTED Final   Candida tropicalis NOT DETECTED NOT DETECTED Final   Cryptococcus neoformans/gattii NOT DETECTED NOT DETECTED Final    Comment: Performed at [facility], [address] [place], [place] [address]  Culture, blood (Routine X 2) w Reflex to ID Panel     Status: None   Collection Time: 04/07/23 12:41 PM   Specimen: BLOOD  Result Value Ref Range Status   Specimen Description BLOOD LEFT ANTECUBITAL  Final   Special Requests   Final    BOTTLES DRAWN AEROBIC AND ANAEROBIC Blood Culture adequate volume   Culture   Final    NO GROWTH 5 DAYS Performed at [facility], [address] [place], [place] [address]    Report Status 04/12/2023 FINAL  Final  Culture, blood (Routine X 2) w Reflex to ID Panel     Status: None   Collection Time: 04/07/23 12:41 PM   Specimen: BLOOD  Result Value Ref Range Status   Specimen Description BLOOD RIGHT ANTECUBITAL  Final   Special Requests   Final    BOTTLES DRAWN AEROBIC AND ANAEROBIC Blood Culture adequate volume   Culture   Final    NO GROWTH 5 DAYS Performed at [facility], [address] [place], [place] [address]    Report Status 04/12/2023 FINAL  Final      Radiology Studies last 3 days: No results found.           LOS: 9 days       Silvano Bilis, MD [facility] 04/14/2023, 12:44 PM     Staff may message me via secure chat in Epic  but this may not receive an immediate response,  please page me for urgent matters!  If 7PM-7AM, please contact night coverage [email or web address]

## 2023-04-14 NOTE — TOC Progression Note (Signed)
Transition of Care [facility]) - Progression Note    Patient Details  Name: Joshua Berry MRN: [id number] Date of Birth: 1946-04-12  [facility] CM/SW Contact  Allena Katz, LCSW Phone Number: 04/14/2023, 1:41 PM  Clinical Narrative:   CSW left message with [facility] coordinator to discuss updated notes sent for paitent. [phone] Helmut Muster Driver [id number]         Expected Discharge Plan and Services                                               Social Determinants of Health (SDOH) Interventions SDOH Screenings   Food Insecurity: No Food Insecurity (04/06/2023)  Housing: Low Risk  (04/06/2023)  Transportation Needs: No Transportation Needs (04/06/2023)  Utilities: Not At Risk (04/06/2023)  Depression (PHQ2-9): Low Risk  (01/07/2022)  Recent Concern: Depression (PHQ2-9) - Medium Risk (12/04/2021)  Tobacco Use: Medium Risk (04/11/2023)    Readmission Risk Interventions    05/17/2022    1:45 PM  Readmission Risk Prevention Plan  Transportation Screening Complete  PCP or Specialist Appt within 5-7 Days Complete  Home Care Screening Complete  Medication Review (RN CM) Complete

## 2023-04-15 DIAGNOSIS — B955 Unspecified streptococcus as the cause of diseases classified elsewhere: Secondary | ICD-10-CM | POA: Diagnosis not present

## 2023-04-15 DIAGNOSIS — R7881 Bacteremia: Secondary | ICD-10-CM | POA: Diagnosis not present

## 2023-04-15 LAB — GLUCOSE, CAPILLARY
Glucose-Capillary: 94 mg/dL (ref 70–99)
Glucose-Capillary: 269 mg/dL — ABNORMAL HIGH (ref 70–99)
Glucose-Capillary: 133 mg/dL — ABNORMAL HIGH (ref 70–99)
Glucose-Capillary: 119 mg/dL — ABNORMAL HIGH (ref 70–99)

## 2023-04-15 NOTE — Progress Notes (Signed)
PT Cancellation Note  Patient Details Name: [facility] MRN: [id number] DOB: 1946/07/29   Cancelled Treatment:     Therapist in this pm to see pt for PT session. Pt had just returned to bed with c/o fatigue and politely declined. Will re-attempt next available date/time per POC.   Jannet Askew 04/15/2023, 3:21 PM

## 2023-04-15 NOTE — Progress Notes (Signed)
PROGRESS NOTE    Joshua Berry   RUE:[id number] DOB: 07/25/1946  DOA: 04/05/2023 Date of Service: 04/15/23 PCP: [facility]     Brief Narrative / Hospital Course:  Joshua Berry is a 77 year old male with history of NASH liver cirrhosis, hypertension, iron deficiency anemia, chronic thrombocytopenia, who presents from [facility] assisted [facility] via EMS on 04/05/23 to emergency department for chief concerns of altered mental status. According to his wife he became progressively altered throughout the day and afternoon yesterday. No recent illnesses, trauma, other acute medical complaints. Last night given his altered mental status did not take his lactulose.  04/20: disoriented to situation but oriented to self. (+)fever, tachypnea, low WBC 3.8 = SIRS/sepsis. Ammonia elevated to 71. Lactic 2.1 --> 1.7. COVID/flu/RSV neg. UA neg for UTI. CXR non-acute. CT head non-acute, (+)chronic microvascular ischemic change and cerebral volume loss. CT C/A/P: liver disease, patent TIPS shunt, severe T12 compression question subacute, no noted infection. Admitted for concern for sepsis / hepatic encephalopathy  04/21: MRI brain non-acute. Ammonia down to 33. BCx (+)strep, change abx to ceftriaxone, Echo ordered, will repeat BCx and ask ID to see tomorrow. PT/OT ordered.  04/22: stable. Repeat BCx. MRI to eval for osteo Tspine given compression fx --> no osteo noted. TTE no concerns. ID saw patient - will need 4 weeks IV abx.  04/23: Cardiology consulted for TEE.  4/24: TEE and mri of right shoulder negative, CT maxillofacial extensive caries, no abscess 4/30: stable awaiting snf placement       Consultants:  Infectious Disease Cardiology    Procedures: none           ASSESSMENT & PLAN:   Principal Problem:   Severe sepsis Active Problems:   Acute hepatic encephalopathy   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   Chronic diastolic CHF (congestive heart  failure)   Hypothyroidism   Overweight (BMI 25.0-29.9)   Secondary esophageal varices without bleeding   HLD (hyperlipidemia)   HTN (hypertension)   Physical deconditioning   Hypomagnesemia   Hepatic encephalopathy      Sepsis w/ positive BCx x4 strep, source likely dental infection  Blood cultures (+)strep  Cefepime and vancomycin per pharmacy --> ceftriaxone, ID has transitioned to oral amoxicillin through 5/6  Most recent (4/22) blood cultres are no growth TEE negative Mri right shoulder negative  ct of maxillofacial no abscess, has extensive caries and one fractured incisor  Dental caries Known problem. His dentist hesitant to remove teeth given bleeding risk. He was referred to and has seen an oral surgeon in [place]. That surgeon is planning to remove all the patient's teeth. The procedure still needs to be scheduled. [facility] consult, but snf has now been approved. 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 TTE/TEE unremarkable as above   Acute encephalopathy Likely hepatic encephalopathy + infection  Resolved. MRI unremarkable    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 twice daily lactulose 30 g p.o. thrice daily Home lasix re-started 4/27   Thrombocytopenia D/t liver disease Has been unable to get dental extractions d/t this per daughter  Monitor Plt, stable at around 50  Hypothyroidism Levothyroxine 100 mcg daily    HTN (hypertension) Holding statin given advanced liver disease    IDDM Type 2 SSI ac Basal insulin      DVT prophylaxis:  TED hose, no Rx ppx d/t chronic thrombocytopenia  Pertinent IV fluids/nutrition: no continuous IV fluids; regular diet  Central lines / invasive devices: none   Code Status: DNR - DNR, admitting hospitalist Dr Sedalia Muta noted she confirmed this with daughter at bedside 04/20. Daughter states patient has a MOST form  ACP documentation reviewed: 04/06/23, 8:01 AM: none on file    Current Admission Status: inpatient  TOC needs / Dispo plan:  snf Barriers to discharge / significant pending items: snf placement   Family communication: daughter updated @ bedside 4/29    Subjective / Brief ROS:  Denies pain other than chronic shoulder pain and low back pain, tolerating diet, voiding, having BMs      Objective Findings:  Vitals:   04/14/23 1518 04/14/23 1941 04/15/23 0449 04/15/23 0809  BP: (!) 111/55 117/60 (!) 107/55 113/61  Pulse: 66 72 (!) 57 61  Resp: 16 18 18 16   Temp: 98.2 F (36.8 C) 98.7 F (37.1 C) 97.8 F (36.6 C) 98.5 F (36.9 C)  TempSrc: Oral Oral Oral Oral  SpO2: 99% 100% 97% 97%  Weight:      Height:        Intake/Output Summary (Last 24 hours) at 04/15/2023 1247 Last data filed at 04/15/2023 0810 Gross per 24 hour  Intake 240 ml  Output 700 ml  Net -460 ml   Filed Weights   04/05/23 1628  Weight: 104.4 kg    Examination:  NAD CTAB RRR Abdomen soft, non-tender Ext warm, no edema       Scheduled Medications:   acidophilus  1 capsule Oral Daily   amoxicillin  1,000 mg Oral Q8H   ascorbic acid  1,000 mg Oral Daily   famotidine  20 mg Oral Daily   folic acid  1 mg Oral Daily   furosemide  20 mg Oral Daily   insulin aspart  0-9 Units Subcutaneous TID WC   insulin aspart  3 Units Subcutaneous TID WC   insulin glargine-yfgn  10 Units Subcutaneous QHS   ketotifen  1 drop Both Eyes BID   lactulose  30 g Oral TID   levothyroxine  100 mcg Oral Q0600   magnesium oxide  800 mg Oral BID   melatonin  10 mg Oral QHS   pantoprazole  40 mg Oral BID    rifaximin  550 mg Oral BID   terazosin  5 mg Oral QHS    Continuous Infusions:    PRN Medications:  acetaminophen, lidocaine, polyvinyl alcohol, senna-docusate, trolamine salicylate  Antimicrobials from admission:  Anti-infectives (From admission, onward)    Start     Dose/Rate Route Frequency Ordered Stop   04/10/23 2200  amoxicillin (AMOXIL) capsule 1,000 mg        1,000 mg Oral Every 8 hours 04/10/23 1622 04/21/23 2159   04/06/23 2200  vancomycin (VANCOREADY) IVPB 2000 mg/400 mL  Status:  Discontinued        2,000 mg 200 mL/hr over 120 Minutes Intravenous Every 24 hours 04/05/23 2054 04/06/23 1224   04/06/23 1400  cefTRIAXone (ROCEPHIN) 2 g in sodium chloride 0.9 % 100 mL IVPB  Status:  Discontinued        2 g 200 mL/hr over 30 Minutes Intravenous Every 24 hours 04/06/23 1224 04/10/23 1622   04/05/23 2200  rifaximin (XIFAXAN) tablet 550 mg       Note to Pharmacy: Takes with lactulose     550 mg Oral 2 times daily 04/05/23 1800     04/05/23 2200  vancomycin (VANCOCIN) IVPB 1000 mg/200 mL premix       See Hyperspace for full Linked Orders Report.   1,000 mg 200 mL/hr over 60 Minutes Intravenous  Once 04/05/23 2054 04/06/23 0104   04/05/23 2100  vancomycin (VANCOREADY) IVPB 1500 mg/300 mL       See Hyperspace for full Linked Orders Report.   1,500 mg 150 mL/hr over 120 Minutes Intravenous  Once 04/05/23 2054 04/06/23 0456   04/05/23 2100  ceFEPIme (MAXIPIME) 2 g in sodium chloride 0.9 % 100 mL IVPB  Status:  Discontinued        2 g 200 mL/hr over 30 Minutes Intravenous Every 8 hours 04/05/23 2054 04/06/23 1224           Data Reviewed:  I have personally reviewed the following...  CBC: Recent Labs  Lab 04/09/23 0545 04/10/23 0531 04/11/23 0608 04/14/23 0549  WBC 4.8 4.4 5.0 2.7*  HGB 11.7* 12.1* 12.8* 11.5*  HCT 33.0* 34.5* 36.2* 32.7*  MCV 92.2 93.0 90.7 92.9  PLT 46* 50* 57* 58*   Basic Metabolic Panel: Recent Labs  Lab 04/09/23 0545 04/12/23 0437  04/14/23 0549  NA 135 134* 137  K 3.4* 3.5 3.6  CL 104 103 105  CO2 24 24 25   GLUCOSE 96 111* 109*  BUN 16 12 15   CREATININE 0.94 0.81 0.89  CALCIUM 8.3* 8.7* 8.6*   GFR: Estimated Creatinine Clearance: 88.1 mL/min (by C-G formula based on SCr of 0.89 mg/dL). Liver Function Tests: No results for input(s): "AST", "ALT", "ALKPHOS", "BILITOT", "PROT", "ALBUMIN" in the last 168 hours.  No results for input(s): "LIPASE", "AMYLASE" in the last 168 hours. No results for input(s): "AMMONIA" in the last 168 hours.  Coagulation Profile: No results for input(s): "INR", "PROTIME" in the last 168 hours. Cardiac Enzymes: No results for input(s): "CKTOTAL", "CKMB", "CKMBINDEX", "TROPONINI" in the last 168 hours.  BNP (last 3 results) No results for input(s): "PROBNP" in the last 8760 hours. HbA1C: No results for input(s): "HGBA1C" in the last 72 hours. CBG: Recent Labs  Lab 04/14/23 1153 04/14/23 1648 04/14/23 1944 04/15/23 0758 04/15/23 1143  GLUCAP 165* 139* 189* 94 269*   Lipid Profile: No results for input(s): "CHOL", "HDL", "LDLCALC", "TRIG", "CHOLHDL", "LDLDIRECT" in the last 72 hours. Thyroid Function Tests: No results for input(s): "TSH", "T4TOTAL", "FREET4", "T3FREE", "THYROIDAB" in the last 72 hours. Anemia Panel: No results for input(s): "VITAMINB12", "FOLATE", "FERRITIN", "TIBC", "IRON", "RETICCTPCT" in the last 72 hours. Most Recent Urinalysis On File:     Component Value Date/Time   COLORURINE YELLOW (A) 04/06/2023 0217   APPEARANCEUR CLEAR (A) 04/06/2023 0217   APPEARANCEUR Hazy 05/01/2014 1653   LABSPEC >1.046 (H) 04/06/2023 0217   LABSPEC 1.015 05/01/2014 1653   PHURINE 5.0 04/06/2023 0217   GLUCOSEU NEGATIVE 04/06/2023 0217   GLUCOSEU Negative 05/01/2014 1653   HGBUR SMALL (A) 04/06/2023 0217   BILIRUBINUR NEGATIVE 04/06/2023 0217   BILIRUBINUR Negative 05/01/2014 1653   KETONESUR NEGATIVE 04/06/2023 0217   PROTEINUR 30 (A) 04/06/2023 0217   NITRITE  NEGATIVE 04/06/2023 0217   LEUKOCYTESUR NEGATIVE 04/06/2023 0217   LEUKOCYTESUR Negative 05/01/2014 1653   Sepsis Labs: @LABRCNTIP (procalcitonin:4,lacticidven:4) Microbiology: Recent Results (from the past 240 hour(s))  Resp panel by RT-PCR (RSV, Flu A&B, Covid)  Status: None   Collection Time: 04/05/23  9:24 PM   Specimen: Nasal Swab  Result Value Ref Range Status   SARS Coronavirus 2 by RT PCR NEGATIVE NEGATIVE Final    Comment: (NOTE) SARS-CoV-2 target nucleic acids are NOT DETECTED.  The SARS-CoV-2 RNA is generally detectable in upper respiratory specimens during the acute phase of infection. The lowest concentration of SARS-CoV-2 viral copies this assay can detect is 138 copies/mL. A negative result does not preclude SARS-Cov-2 infection and should not be used as the sole basis for treatment or other patient management decisions. A negative result may occur with  improper specimen collection/handling, submission of specimen other than nasopharyngeal swab, presence of viral mutation(s) within the areas targeted by this assay, and inadequate number of viral copies(<138 copies/mL). A negative result must be combined with clinical observations, patient history, and epidemiological information. The expected result is Negative.  Fact Sheet for Patients:  BloggerCourse.com  Fact Sheet for Healthcare Providers:  SeriousBroker.it  This test is no t yet approved or cleared by the [place] FDA and  has been authorized for detection and/or diagnosis of SARS-CoV-2 by FDA under an Emergency Use Authorization (EUA). This EUA will remain  in effect (meaning this test can be used) for the duration of the COVID-19 declaration under Section 564(b)(1) of the Act, 21 U.S.C.section 360bbb-3(b)(1), unless the authorization is terminated  or revoked sooner.       Influenza A by PCR NEGATIVE NEGATIVE Final   Influenza B by PCR  NEGATIVE NEGATIVE Final    Comment: (NOTE) The Xpert Xpress SARS-CoV-2/FLU/RSV plus assay is intended as an aid in the diagnosis of influenza from Nasopharyngeal swab specimens and should not be used as a sole basis for treatment. Nasal washings and aspirates are unacceptable for Xpert Xpress SARS-CoV-2/FLU/RSV testing.  Fact Sheet for Patients: BloggerCourse.com  Fact Sheet for Healthcare Providers: SeriousBroker.it  This test is not yet approved or cleared by the [place] FDA and has been authorized for detection and/or diagnosis of SARS-CoV-2 by FDA under an Emergency Use Authorization (EUA). This EUA will remain in effect (meaning this test can be used) for the duration of the COVID-19 declaration under Section 564(b)(1) of the Act, 21 U.S.C. section 360bbb-3(b)(1), unless the authorization is terminated or revoked.     Resp Syncytial Virus by PCR NEGATIVE NEGATIVE Final    Comment: (NOTE) Fact Sheet for Patients: BloggerCourse.com  Fact Sheet for Healthcare Providers: SeriousBroker.it  This test is not yet approved or cleared by the [place] FDA and has been authorized for detection and/or diagnosis of SARS-CoV-2 by FDA under an Emergency Use Authorization (EUA). This EUA will remain in effect (meaning this test can be used) for the duration of the COVID-19 declaration under Section 564(b)(1) of the Act, 21 U.S.C. section 360bbb-3(b)(1), unless the authorization is terminated or revoked.  Performed at [facility], [address] [place], [place] [address]   Culture, blood (Routine X 2) w Reflex to ID Panel     Status: Abnormal   Collection Time: 04/05/23  9:24 PM   Specimen: BLOOD RIGHT ARM  Result Value Ref Range Status   Specimen Description   Final    BLOOD RIGHT ARM Performed at [facility], [address], [place], [place] [address]     Special Requests   Final    BOTTLES DRAWN AEROBIC AND ANAEROBIC Blood Culture adequate volume Performed at [facility], [address] [place], [place] [address]  Culture  Setup Time   Final    GRAM POSITIVE COCCI IN BOTH AEROBIC AND ANAEROBIC BOTTLES CRITICAL RESULT CALLED TO, READ BACK BY AND VERIFIED WITH: CAROLYN COULTER AT 1211 04/06/23.PMF  REVIEWED BY A. LAFRANCE    Culture (A)  Final    STREPTOCOCCUS ANGINOSIS SUSCEPTIBILITIES PERFORMED ON PREVIOUS CULTURE WITHIN THE LAST 5 DAYS. Performed at [facility], [address], [place], [place] [address]    Report Status 04/08/2023 FINAL  Final  Culture, blood (Routine X 2) w Reflex to ID Panel     Status: Abnormal   Collection Time: 04/05/23  9:24 PM   Specimen: BLOOD LEFT ARM  Result Value Ref Range Status   Specimen Description BLOOD LEFT ARM  Final   Special Requests   Final    BOTTLES DRAWN AEROBIC AND ANAEROBIC Blood Culture results may not be optimal due to an inadequate volume of blood received in culture bottles   Culture  Setup Time   Final    GRAM POSITIVE COCCI IN BOTH AEROBIC AND ANAEROBIC BOTTLES CRITICAL RESULT CALLED TO, READ BACK BY AND VERIFIED WITH: CAROLYN COULTER AT 1211 04/06/23.PMF  REVIEWED BY A. LAFRANCE    Culture STREPTOCOCCUS ANGINOSIS (A)  Final   Report Status 04/08/2023 FINAL  Final   Organism ID, Bacteria STREPTOCOCCUS ANGINOSIS  Final      Susceptibility   Streptococcus anginosis - MIC*    PENICILLIN <=0.06 SENSITIVE Sensitive     CEFTRIAXONE <=0.12 SENSITIVE Sensitive     ERYTHROMYCIN 1 RESISTANT Resistant     LEVOFLOXACIN 0.5 SENSITIVE Sensitive     VANCOMYCIN 0.5 SENSITIVE Sensitive     * STREPTOCOCCUS ANGINOSIS  Blood Culture ID Panel (Reflexed)     Status: Abnormal   Collection Time: 04/05/23  9:24 PM  Result Value Ref Range Status   Enterococcus faecalis NOT DETECTED NOT DETECTED Final   Enterococcus Faecium NOT DETECTED NOT DETECTED Final   Listeria  monocytogenes NOT DETECTED NOT DETECTED Final   Staphylococcus species NOT DETECTED NOT DETECTED Final   Staphylococcus aureus (BCID) NOT DETECTED NOT DETECTED Final   Staphylococcus epidermidis NOT DETECTED NOT DETECTED Final   Staphylococcus lugdunensis NOT DETECTED NOT DETECTED Final   Streptococcus species DETECTED (A) NOT DETECTED Final    Comment: Not Enterococcus species, Streptococcus agalactiae, Streptococcus pyogenes, or Streptococcus pneumoniae. CRITICAL RESULT CALLED TO, READ BACK BY AND VERIFIED WITH: CAROLYN COULTER AT 1211 04/06/23.PMF    Streptococcus agalactiae NOT DETECTED NOT DETECTED Final   Streptococcus pneumoniae NOT DETECTED NOT DETECTED Final   Streptococcus pyogenes NOT DETECTED NOT DETECTED Final   A.calcoaceticus-baumannii NOT DETECTED NOT DETECTED Final   Bacteroides fragilis NOT DETECTED NOT DETECTED Final   Enterobacterales NOT DETECTED NOT DETECTED Final   Enterobacter cloacae complex NOT DETECTED NOT DETECTED Final   Escherichia coli NOT DETECTED NOT DETECTED Final   Klebsiella aerogenes NOT DETECTED NOT DETECTED Final   Klebsiella oxytoca NOT DETECTED NOT DETECTED Final   Klebsiella pneumoniae NOT DETECTED NOT DETECTED Final   Proteus species NOT DETECTED NOT DETECTED Final   Salmonella species NOT DETECTED NOT DETECTED Final   Serratia marcescens NOT DETECTED NOT DETECTED Final   Haemophilus influenzae NOT DETECTED NOT DETECTED Final   Neisseria meningitidis NOT DETECTED NOT DETECTED Final   Pseudomonas aeruginosa NOT DETECTED NOT DETECTED Final   Stenotrophomonas maltophilia NOT DETECTED NOT DETECTED Final   Candida albicans NOT DETECTED NOT DETECTED Final   Candida auris NOT DETECTED NOT DETECTED Final   Candida glabrata  NOT DETECTED NOT DETECTED Final   Candida krusei NOT DETECTED NOT DETECTED Final   Candida parapsilosis NOT DETECTED NOT DETECTED Final   Candida tropicalis NOT DETECTED NOT DETECTED Final   Cryptococcus neoformans/gattii NOT  DETECTED NOT DETECTED Final    Comment: Performed at [facility], [address] [place], [place] [address]  Culture, blood (Routine X 2) w Reflex to ID Panel     Status: None   Collection Time: 04/07/23 12:41 PM   Specimen: BLOOD  Result Value Ref Range Status   Specimen Description BLOOD LEFT ANTECUBITAL  Final   Special Requests   Final    BOTTLES DRAWN AEROBIC AND ANAEROBIC Blood Culture adequate volume   Culture   Final    NO GROWTH 5 DAYS Performed at [facility], [address] [place], [place] [address]    Report Status 04/12/2023 FINAL  Final  Culture, blood (Routine X 2) w Reflex to ID Panel     Status: None   Collection Time: 04/07/23 12:41 PM   Specimen: BLOOD  Result Value Ref Range Status   Specimen Description BLOOD RIGHT ANTECUBITAL  Final   Special Requests   Final    BOTTLES DRAWN AEROBIC AND ANAEROBIC Blood Culture adequate volume   Culture   Final    NO GROWTH 5 DAYS Performed at [facility], [address] [place], [place] [address]    Report Status 04/12/2023 FINAL  Final      Radiology Studies last 3 days: No results found.           LOS: 10 days       Silvano Bilis, MD [facility] 04/15/2023, 12:47 PM     Staff may message me via secure chat in Epic  but this may not receive an immediate response,  please page me for urgent matters!  If 7PM-7AM, please contact night coverage [email or web address]

## 2023-04-15 NOTE — TOC Progression Note (Signed)
Transition of Care [facility]) - Progression Note    Patient Details  Name: Joshua Berry MRN: [id number] Date of Birth: [birth year]  Transition of [facility]) CM/SW Contact  Allena Katz, LCSW Phone Number: 04/15/2023, 2:12 PM  Clinical Narrative:   List of approved [facility] facilites sent to daughter. VM left for patients spouse. Family will follow back up on choice of facility. [facility] CM is Jasmine [phone].         Expected Discharge Plan and Services                                               Social Determinants of Health (SDOH) Interventions SDOH Screenings   Food Insecurity: No Food Insecurity (04/06/2023)  Housing: Low Risk  (04/06/2023)  Transportation Needs: No Transportation Needs (04/06/2023)  Utilities: Not At Risk (04/06/2023)  Depression (PHQ2-9): Low Risk  (01/07/2022)  Recent Concern: Depression (PHQ2-9) - Medium Risk (12/04/2021)  Tobacco Use: Medium Risk (04/11/2023)    Readmission Risk Interventions    05/17/2022    1:45 PM  Readmission Risk Prevention Plan  Transportation Screening Complete  PCP or Specialist Appt within 5-7 Days Complete  Home Care Screening Complete  Medication Review (RN CM) Complete

## 2023-04-15 NOTE — Plan of Care (Signed)

## 2023-04-15 NOTE — Progress Notes (Signed)
Mobility Specialist - Progress Note   04/15/23 1127  Mobility  Activity Ambulated with assistance in room (stood at recliner)  Level of Assistance Minimal assist, patient does 75% or more  Assistive Device Front wheel walker  Distance Ambulated (ft) 4 ft  Activity Response Tolerated well  $Mobility charge 1 Mobility   Pt sitting in the recliner upon entry, utilizing RA. Pt completed STS x3 MinA-- requiring less assistance each time. Pt stated that MS assisting with STS "makes me feel like I'm more stable and secure". Once standing for a third time pt took a few forward steps toward the end of bed-- slow shuffle gait. Pt took backward steps to return to the recliner-- heavy weight bearing through UE on RW, left seated with alarm set and needs within reach.   Zetta Bills Mobility Specialist 04/15/23 11:32 AM

## 2023-04-16 DIAGNOSIS — R7881 Bacteremia: Secondary | ICD-10-CM | POA: Diagnosis not present

## 2023-04-16 DIAGNOSIS — K029 Dental caries, unspecified: Secondary | ICD-10-CM

## 2023-04-16 DIAGNOSIS — B955 Unspecified streptococcus as the cause of diseases classified elsewhere: Secondary | ICD-10-CM | POA: Diagnosis not present

## 2023-04-16 DIAGNOSIS — K7469 Other cirrhosis of liver: Secondary | ICD-10-CM | POA: Diagnosis not present

## 2023-04-16 LAB — GLUCOSE, CAPILLARY
Glucose-Capillary: 112 mg/dL — ABNORMAL HIGH (ref 70–99)
Glucose-Capillary: 140 mg/dL — ABNORMAL HIGH (ref 70–99)
Glucose-Capillary: 220 mg/dL — ABNORMAL HIGH (ref 70–99)
Glucose-Capillary: 154 mg/dL — ABNORMAL HIGH (ref 70–99)

## 2023-04-16 MED ORDER — MORPHINE SULFATE (PF) 2 MG/ML IV SOLN
1.0000 mg | INTRAVENOUS | Status: DC | PRN
  Administered 2023-04-16: 1 mg via INTRAVENOUS
  Filled 2023-04-16: qty 1

## 2023-04-16 MED ORDER — OXYCODONE-ACETAMINOPHEN 5-325 MG PO TABS: 1.0000 | ORAL_TABLET | Freq: Four times a day (QID) | ORAL | Status: DC | PRN

## 2023-04-16 NOTE — Progress Notes (Signed)
Orthopedic Tech Progress Note Patient Details:  Joshua Berry Jun 08, 1946 [id number]  Called in order to [employer] for a TLSO BRACE   Patient ID: Joshua Berry, male   DOB: May 05, 1946, 76 y.o.   MRN: [id number]  [name] 04/16/2023, 12:53 PM

## 2023-04-16 NOTE — TOC Progression Note (Signed)
Transition of Care [facility]) - Progression Note    Patient Details  Name: Joshua Berry MRN: [id number] Date of Birth: Nov 12, 1946  [facility]) CM/SW Contact  Allena Katz, LCSW Phone Number: 04/16/2023, 1:36 PM  Clinical Narrative:  CSW spoke with pt daughter, who after speaking with the patient and her mother report she would like to go to SNF under patients medicare benefit so pt is able to go to [facility].  [facility] states they are able to accept patient on 5/2.           Expected Discharge Plan and Services                                               Social Determinants of Health (SDOH) Interventions SDOH Screenings   Food Insecurity: No Food Insecurity (04/06/2023)  Housing: Low Risk  (04/06/2023)  Transportation Needs: No Transportation Needs (04/06/2023)  Utilities: Not At Risk (04/06/2023)  Depression (PHQ2-9): Low Risk  (01/07/2022)  Recent Concern: Depression (PHQ2-9) - Medium Risk (12/04/2021)  Tobacco Use: Medium Risk (04/11/2023)    Readmission Risk Interventions    05/17/2022    1:45 PM  Readmission Risk Prevention Plan  Transportation Screening Complete  PCP or Specialist Appt within 5-7 Days Complete  Home Care Screening Complete  Medication Review (RN CM) Complete

## 2023-04-16 NOTE — Progress Notes (Signed)
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 Lelon Mast note on how pt met sepsis criteria. Likely secondary to bacteremia from likely dental infection. Blood cxs growing strep. Continue on amoxicillin as per ID. Repeat blood cxs NGTD. TEE neg    Dental caries: chronic. His dentist hesitant to remove teeth given bleeding risk. He was referred to and has seen an oral surgeon in [place]. That surgeon is planning to remove all the patient's teeth. The procedure still needs to be scheduled   Compression fracture T-spine: continue w/ supportive care. PT/OT recs SNF    Urinary retention: s/p I/O cath x 2. Resolved   BPH: continue on doxazosin  R shoulder pain: continue on lidocaine patch. 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 Continue on glargine, SSI w/ accuchecks       DVT prophylaxis: SCDs Code Status: DNR  Family Communication:   Disposition Plan:  waiting on SNF placement   Level of care: Med-Surg Status is: Inpatient Remains inpatient appropriate because: waiting on SNF placement    Consultants:    Procedures:   Antimicrobials: amoxicillin    Subjective: Pt c/o back pain   Objective: Vitals:   04/15/23 1541 04/15/23 2129 04/16/23 0436 04/16/23 0724  BP: (!) 112/57 (!) 129/58 (!) 107/58 103/60  Pulse: 67 65 62 61  Resp: 17 19 18 16   Temp: 98.7 F (37.1 C) 98.6 F (37 C) (!) 97.3 F (36.3 C) 98.1 F (36.7 C)  TempSrc: Oral Oral Oral   SpO2: 99% 96% 98% 98%  Weight:      Height:        Intake/Output Summary (Last 24 hours) at 04/16/2023 0745 Last data filed at 04/15/2023 0810 Gross per 24 hour  Intake --  Output 200 ml  Net -200 ml   Filed Weights   04/05/23 1628  Weight: 104.4 kg    Examination:  General exam: Appears calm and comfortable  Respiratory system: Clear to auscultation. Respiratory effort normal. Cardiovascular system: S1 & S2+. No  rubs, gallops or clicks.  Gastrointestinal system: Abdomen is nondistended, soft and nontender. Normal bowel sounds heard. Central nervous system: Alert and awake. Moves all extremities  Psychiatry: Judgement and insight appears at baseline. Flat mood and affect    Data Reviewed: I have personally reviewed following labs and imaging studies  CBC: Recent Labs  Lab 04/10/23 0531 04/11/23 0608 04/14/23 0549  WBC 4.4 5.0 2.7*  HGB 12.1*  12.8* 11.5*  HCT 34.5* 36.2* 32.7*  MCV 93.0 90.7 92.9  PLT 50* 57* 58*   Basic Metabolic Panel: Recent Labs  Lab 04/12/23 0437 04/14/23 0549  NA 134* 137  K 3.5 3.6  CL 103 105  CO2 24 25  GLUCOSE 111* 109*  BUN 12 15  CREATININE 0.81 0.89  CALCIUM 8.7* 8.6*   GFR: Estimated Creatinine Clearance: 88.1 mL/min (by C-G formula based on SCr of 0.89 mg/dL). Liver Function Tests: No results for input(s): "AST", "ALT", "ALKPHOS", "BILITOT", "PROT", "ALBUMIN" in the last 168 hours. No results  for input(s): "LIPASE", "AMYLASE" in the last 168 hours. No results for input(s): "AMMONIA" in the last 168 hours. Coagulation Profile: No results for input(s): "INR", "PROTIME" in the last 168 hours. Cardiac Enzymes: No results for input(s): "CKTOTAL", "CKMB", "CKMBINDEX", "TROPONINI" in the last 168 hours. BNP (last 3 results) No results for input(s): "PROBNP" in the last 8760 hours. HbA1C: No results for input(s): "HGBA1C" in the last 72 hours. CBG: Recent Labs  Lab 04/15/23 0758 04/15/23 1143 04/15/23 1658 04/15/23 2145 04/16/23 0725  GLUCAP 94 269* 133* 119* 112*   Lipid Profile: No results for input(s): "CHOL", "HDL", "LDLCALC", "TRIG", "CHOLHDL", "LDLDIRECT" in the last 72 hours. Thyroid Function Tests: No results for input(s): "TSH", "T4TOTAL", "FREET4", "T3FREE", "THYROIDAB" in the last 72 hours. Anemia Panel: No results for input(s): "VITAMINB12", "FOLATE", "FERRITIN", "TIBC", "IRON", "RETICCTPCT" in the last 72 hours. Sepsis Labs: No results for input(s): "PROCALCITON", "LATICACIDVEN" in the last 168 hours.  Recent Results (from the past 240 hour(s))  Culture, blood (Routine X 2) w Reflex to ID Panel     Status: None   Collection Time: 04/07/23 12:41 PM   Specimen: BLOOD  Result Value Ref Range Status   Specimen Description BLOOD LEFT ANTECUBITAL  Final   Special Requests   Final    BOTTLES DRAWN AEROBIC AND ANAEROBIC Blood Culture adequate volume   Culture   Final    NO GROWTH 5 DAYS Performed at [facility], [address], [place], [place] [address]    Report Status 04/12/2023 FINAL  Final  Culture, blood (Routine X 2) w Reflex to ID Panel     Status: None   Collection Time: 04/07/23 12:41 PM   Specimen: BLOOD  Result Value Ref Range Status   Specimen Description BLOOD RIGHT ANTECUBITAL  Final   Special Requests   Final    BOTTLES DRAWN AEROBIC AND ANAEROBIC Blood Culture adequate volume   Culture   Final    NO GROWTH 5 DAYS Performed at  [facility], [address], [place], [place] [address]    Report Status 04/12/2023 FINAL  Final         Radiology Studies: No results found.      Scheduled Meds:  acidophilus  1 capsule Oral Daily   amoxicillin  1,000 mg Oral Q8H   ascorbic acid  1,000 mg Oral Daily   famotidine  20 mg Oral Daily   folic acid  1 mg Oral Daily   furosemide  20 mg Oral Daily   insulin aspart  0-9 Units Subcutaneous TID WC   insulin aspart  3 Units Subcutaneous TID WC   insulin glargine-yfgn  10 Units Subcutaneous QHS   ketotifen  1 drop Both Eyes BID   lactulose  30 g Oral TID   levothyroxine  100 mcg Oral Q0600   magnesium oxide  800 mg Oral BID   melatonin  10 mg Oral  QHS   pantoprazole  40 mg Oral BID   rifaximin  550 mg Oral BID   terazosin  5 mg Oral QHS   Continuous Infusions:   LOS: 11 days    Time spent: 33 mins    Charise Killian, MD [facility] Pager [phone]  If 7PM-7AM, please contact night-coverage [email or web address] 04/16/2023, 7:45 AM

## 2023-04-16 NOTE — TOC Progression Note (Signed)
Transition of Care [facility]) - Progression Note    Patient Details  Name: Joshua Berry MRN: [id number] Date of Birth: [birth year]  [facility] CM/SW Contact  Allena Katz, LCSW Phone Number: 04/16/2023, 11:13 AM  Clinical Narrative:   Referral faxed to [facility]. [facility] to follow back up. [facility] and [facility] unable to accept [facility].         Expected Discharge Plan and Services                                               Social Determinants of Health (SDOH) Interventions SDOH Screenings   Food Insecurity: No Food Insecurity (04/06/2023)  Housing: Low Risk  (04/06/2023)  Transportation Needs: No Transportation Needs (04/06/2023)  Utilities: Not At Risk (04/06/2023)  Depression (PHQ2-9): Low Risk  (01/07/2022)  Recent Concern: Depression (PHQ2-9) - Medium Risk (12/04/2021)  Tobacco Use: Medium Risk (04/11/2023)    Readmission Risk Interventions    05/17/2022    1:45 PM  Readmission Risk Prevention Plan  Transportation Screening Complete  PCP or Specialist Appt within 5-7 Days Complete  Home Care Screening Complete  Medication Review (RN CM) Complete

## 2023-04-16 NOTE — Progress Notes (Signed)
OT Cancellation Note  Patient Details Name: Joshua Berry MRN: [id number] DOB: 06/12/1946   Cancelled Treatment:    Reason Eval/Treat Not Completed: Pain limiting ability to participate;Fatigue/lethargy limiting ability to participate. Pt received in bed with RN present who reported pt got pain meds recently. Pt endorsed fatigue from activity earlier this date and back pain despite being pre-medicated. Pt declined all therapeutic intervention at this time and requested OT come back in the morning. TLSO brace in room for mobility. Will re-attempt at later date/time.  Dorene Grebe  [facility] 04/16/2023, 4:00 PM

## 2023-04-16 NOTE — Progress Notes (Addendum)
Physical Therapy Treatment Patient Details Name: Joshua Berry MRN: [id number] DOB: 1946/08/12 Today's Date: 04/16/2023   History of Present Illness Patient is a 77 year old male with chief complaint of AMS, found to be septic with infection source unclear. History of [name], liver cirrhosis, hypertension, iron deficiency anemia, chronic thrombocytopenia    PT Comments    PT was broken into two separate smaller sessions due to pt not wanting to ambulate prior to breakfast. Author assisted pt OOB to recliner for breakfast then returned to progress gait. Pt c/o severe back pain however was able to ambulate in room ~ 25 ft with flex, slow gait kinematics. Pt required extensive assistance to return to bed after OOB activity. RN aware of pt requesting pain meds. Acute PT will continue to follow. Highly recommend continued skilled PT to maximize independence and safety with all ADLs. Pt is hopeful to get strong enough to eventually return to ALF.    Recommendations for follow up therapy are one component of a multi-disciplinary discharge planning process, led by the attending physician.  Recommendations may be updated based on patient status, additional functional criteria and insurance authorization.     Assistance Recommended at Discharge Frequent or constant Supervision/Assistance  Patient can return home with the following A lot of help with walking and/or transfers;A lot of help with bathing/dressing/bathroom;Assistance with cooking/housework;Assistance with feeding;Direct supervision/assist for medications management;Direct supervision/assist for financial management;Assist for transportation;Help with stairs or ramp for entrance   Equipment Recommendations   (Defer to next level of care)       Precautions / Restrictions Precautions Precautions: Fall Restrictions Weight Bearing Restrictions: No     Mobility  Bed Mobility Overal bed mobility: Needs Assistance Bed Mobility: Supine to  Sit Rolling: Min assist Sidelying to sit: Mod assist Supine to sit: Mod assist Sit to supine: Mod assist      Transfers Overall transfer level: Needs assistance Equipment used: Rolling walker (2 wheels) Transfers: Sit to/from Stand Sit to Stand: Mod assist, From elevated surface     Ambulation/Gait Ambulation/Gait assistance: Min assist Gait Distance (Feet): 25 Feet Assistive device: Rolling walker (2 wheels) Gait Pattern/deviations: Step-to pattern, Trunk flexed, Narrow base of support Gait velocity: decreased  General Gait Details: Pt was able to ambulate ~ 25 ft   with RW. Slow cadence with flexed posture. Distance limited by pain    Balance Overall balance assessment: Needs assistance Sitting-balance support: Feet supported Sitting balance-Leahy Scale: Good     Standing balance support: Bilateral upper extremity supported, During functional activity, Reliant on assistive device for balance Standing balance-Leahy Scale: Fair Standing balance comment: reliant on RW for all dynamic standing ctivity       Cognition Arousal/Alertness: Awake/alert Behavior During Therapy: WFL for tasks assessed/performed Overall Cognitive Status: No family/caregiver present to determine baseline cognitive functioning              Pertinent Vitals/Pain Pain Assessment Pain Assessment: 0-10 Pain Score: 7  Pain Location: back Pain Descriptors / Indicators: Discomfort, Sore, Grimacing Pain Intervention(s): Limited activity within patient's tolerance, Monitored during session, Repositioned, Patient requesting pain meds-RN notified     PT Goals (current goals can now be found in the care plan section) Acute Rehab PT Goals Patient Stated Goal: return to ALF when safe Progress towards PT goals: Progressing toward goals    Frequency    Min 3X/week      PT Plan Current plan remains appropriate       AM-PAC PT "6 Clicks" Mobility  Outcome Measure  Help needed turning from  your back to your side while in a flat bed without using bedrails?: A Little Help needed moving from lying on your back to sitting on the side of a flat bed without using bedrails?: A Little Help needed moving to and from a bed to a chair (including a wheelchair)?: A Lot Help needed standing up from a chair using your arms (e.g., wheelchair or bedside chair)?: A Lot Help needed to walk in hospital room?: A Little Help needed climbing 3-5 steps with a railing? : A Lot 6 Click Score: 15    End of Session   Activity Tolerance: Patient tolerated treatment well;Patient limited by pain Patient left: in bed;with call bell/phone within reach;with bed alarm set Nurse Communication: Mobility status PT Visit Diagnosis: Unsteadiness on feet (R26.81);Muscle weakness (generalized) (M62.81)     Time: 1010-1035 PT Time Calculation (min) (ACUTE ONLY): 25 min  Charges:  $Gait Training: 8-22 mins $Therapeutic Activity: 8-22 mins                    Jetta Lout PTA 04/16/23, 10:47 AM

## 2023-04-17 DIAGNOSIS — K7469 Other cirrhosis of liver: Secondary | ICD-10-CM | POA: Diagnosis not present

## 2023-04-17 DIAGNOSIS — B955 Unspecified streptococcus as the cause of diseases classified elsewhere: Secondary | ICD-10-CM | POA: Diagnosis not present

## 2023-04-17 DIAGNOSIS — K029 Dental caries, unspecified: Secondary | ICD-10-CM | POA: Diagnosis not present

## 2023-04-17 DIAGNOSIS — R7881 Bacteremia: Secondary | ICD-10-CM | POA: Diagnosis not present

## 2023-04-17 LAB — GLUCOSE, CAPILLARY: Glucose-Capillary: 92 mg/dL (ref 70–99)

## 2023-04-17 MED ORDER — AMOXICILLIN 500 MG PO CAPS: 1000.0000 mg | ORAL_CAPSULE | Freq: Three times a day (TID) | ORAL | 0 refills | Status: AC

## 2023-04-17 MED ORDER — OXYCODONE-ACETAMINOPHEN 5-325 MG PO TABS: 1.0000 | ORAL_TABLET | Freq: Four times a day (QID) | ORAL | 0 refills | Status: AC | PRN

## 2023-04-17 NOTE — Discharge Summary (Signed)
Physician Discharge Summary  Joshua Berry WUJ:[id number] DOB: 1946/05/16 DOA: 04/05/2023  PCP: [facility]  Admit date: 04/05/2023 Discharge date: 04/17/2023  Admitted From: home  Disposition:  SNF  Recommendations for Outpatient Follow-up:  Follow up with PCP in 1-2 weeks F/u w/ oral surgeon ASAP  Home Health: no  Equipment/Devices:  Discharge Condition: stable  CODE STATUS: DNR Diet recommendation: Carb Modified   Brief/Interim Summary: HPI was taken from Dr. Sedalia Muta: [name] is a 77 year old male with history of [place], liver cirrhosis, hypertension, iron deficiency anemia, chronic thrombocytopenia, who presents to emergency department for chief concerns of altered mental status.   Vitals show temperature of 100, respiration rate of 20, heart rate 91, blood pressure 123/57, SpO2 of 98% on room air.   Serum sodium is 137, potassium 2.8, chloride 1007, bicarb 22, BUN of 14, serum creatinine 1.14, EGFR greater than 60, nonfasting blood glucose 122, WBC 3.8, hemoglobin 13.1, platelets of 37.   Ammonia level was elevated at 71, high sensitive troponin was 12, CK level was 114.   Per EDP, patient missed his lactulose dosing on 04/04/2023.   ED treatment: Lactulose 30 g p.o. one-time dose, sodium chloride 1 L bolus ---------------------------- At bedside, patient responded to his first and last name.  He has his eyes closed.  He appears lethargic.  He opens his eyes with verbal stimuli however does not appropriately answer questions.  He turns to his daughter and did not acknowledge her and or state her name.  He was not able to tell me his age, current calendar year, current location.  He is able to protect his airway.  He does not follow commands otherwise.   Daughter, [name] is at bedside.  She takes him to his medical appointments and helps him with his medication though legally, his spouse is the healthcare power of attorney.  [name]'s mother and patient's healthcare power  of attorney is wheelchair-bound.  They both live at [facility] assisted.   Per daughter at bedside, she reports that her mother did not report patient having any vomiting or diarrhea or fever at home.  Daughter reports that patient may have missed 1 day dose of lactulose on 4/19.  As per Dr. Ashok Pall: Joshua Berry is a 76 year old male with history of NASH liver cirrhosis, hypertension, iron deficiency anemia, chronic thrombocytopenia, who presents from [place] assisted living via EMS on 04/05/23 to emergency department for chief concerns of altered mental status. According to his wife he became progressively altered throughout the day and afternoon yesterday. No recent illnesses, trauma, other acute medical complaints. Last night given his altered mental status did not take his lactulose.  04/20: disoriented to situation but oriented to self. (+)fever, tachypnea, low WBC 3.8 = SIRS/sepsis. Ammonia elevated to 71. Lactic 2.1 --> 1.7. COVID/flu/RSV neg. UA neg for UTI. CXR non-acute. CT head non-acute, (+)chronic microvascular ischemic change and cerebral volume loss. CT C/A/P: liver disease, patent TIPS shunt, severe T12 compression question subacute, no noted infection. Admitted for concern for sepsis / hepatic encephalopathy  04/21: MRI brain non-acute. Ammonia down to 33. BCx (+)strep, change abx to ceftriaxone, Echo ordered, will repeat BCx and ask ID to see tomorrow. PT/OT ordered.  04/22: stable. Repeat BCx. MRI to eval for osteo Tspine given compression fx --> no osteo noted. TTE no concerns. ID saw patient - will need 4 weeks IV abx.  04/23: Cardiology consulted for TEE.  4/24: TEE and mri of right shoulder negative, CT maxillofacial extensive caries, no  abscess 4/30: stable awaiting snf placement  As per Dr. Mayford Knife 5/1-04/17/23: Pt requested a back brace for his back pain. PT/OT had evaluated the pt and recommended SNF. Pt was agreeable to SNF. For more information, please see previous  progress/consult notes.    Discharge Diagnoses:  Principal Problem:   Streptococcal bacteremia Active Problems:   Acute hepatic encephalopathy (HCC)   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   Chronic diastolic CHF (congestive heart failure) (HCC)   Hypothyroidism   Overweight (BMI 25.0-29.9)   Severe sepsis (HCC)   Secondary esophageal varices without bleeding (HCC)   Portal hypertension (HCC)   HLD (hyperlipidemia)   HTN (hypertension)   Pancytopenia (HCC)   Physical deconditioning   Lactic acidosis   Hypomagnesemia   Hepatic encephalopathy (HCC)   Altered mental status   Acute urinary retention  Sepsis: see Dr. Lelon Mast note on how pt met sepsis criteria. Likely secondary to bacteremia from likely dental infection. Blood cxs growing strep. Continue on amoxicillin until 04/21/23 as per ID. Repeat blood cxs NGTD. TEE neg    Dental caries: chronic. His dentist hesitant to remove teeth given bleeding risk. He was referred to and has seen an oral surgeon in [place]. That surgeon is planning to remove all the patient's teeth. The procedure still needs to be scheduled   Compression fracture T-spine: continue w/ supportive care & back brace prn. PT/OT recs SNF    Urinary retention: s/p I/O cath x 2. Resolved    BPH: continue on doxazosin   R shoulder pain: continue on lidocaine patch. 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 Continue on home anti-DM meds at d/c   Discharge Instructions  Discharge Instructions     Diet Carb Modified   Complete by: As directed    Discharge instructions   Complete by: As  directed    F/u w/ PCP in 1-2 weeks. F/u w/ oral surgeon as soon as possible for dental caries   Increase activity slowly   Complete by: As directed       Allergies as of 04/17/2023       Reactions   Nsaids    Other Reaction(s): Ascites   Camphor Rash, Swelling   Nickel Rash   Sulfur Rash, Swelling   Sulfamethoxazole Swelling   Latex Rash   Lisinopril Hives   Other Reaction(s): Cough        Medication List     TAKE these medications    amoxicillin 500 MG capsule Commonly known as: AMOXIL Take 2 capsules (1,000 mg total) by mouth every 8 (eight) hours for 4 days.   ascorbic acid 500 MG tablet Commonly known as: VITAMIN C Take 1,000 mg by mouth daily.   Aspercreme Lidocaine 4 % Liqd Generic drug: Lidocaine HCl Apply 1 Application topically daily as needed.   atorvastatin 20 MG tablet Commonly known as: LIPITOR Take 20 mg by mouth at bedtime.   augmented betamethasone dipropionate 0.05 % cream Commonly known as: DIPROLENE-AF Apply 1 application topically 2 (two) times daily.   Cholecalciferol 50 MCG (2000 UT) Tabs Take 1 tablet by mouth daily.   famotidine  20 MG tablet Commonly known as: PEPCID Take 20 mg by mouth daily.   Fish Oil 1000 MG Caps Take 1 capsule by mouth 2 (two) times daily.   folic acid 1 MG tablet Commonly known as: FOLVITE Take 1 tablet by mouth daily.   furosemide 20 MG tablet Commonly known as: LASIX Take 1 tablet (20 mg total) by mouth daily.   insulin aspart 100 UNIT/ML injection Commonly known as: novoLOG Inject 10 Units into the skin 3 (three) times daily before meals. What changed:  how much to take additional instructions   insulin glargine 100 UNIT/ML injection Commonly known as: LANTUS Inject 0.14 mLs (14 Units total) into the skin daily.   ketotifen 0.025 % ophthalmic solution Commonly known as: ZADITOR Place 1 drop into both eyes 2 (two) times daily.   lactulose 10 GM/15ML solution Commonly known as:  CHRONULAC Take 45 mLs (30 g total) by mouth 2 (two) times daily.   levothyroxine 100 MCG tablet Commonly known as: SYNTHROID Take 100 mcg by mouth daily before breakfast.   lidocaine 5 % Commonly known as: LIDODERM Place 2 patches onto the skin daily. Remove & Discard patch within 12 hours or as directed by MD   magnesium oxide 400 MG tablet Commonly known as: MAG-OX Take 2 tablets (800 mg total) by mouth 2 (two) times daily.   Melatonin 10 MG Tabs Take 10 mg by mouth at bedtime.   oxyCODONE-acetaminophen 5-325 MG tablet Commonly known as: PERCOCET/ROXICET Take 1 tablet by mouth every 6 (six) hours as needed for up to 1 day for moderate pain or severe pain.   pantoprazole 40 MG tablet Commonly known as: Protonix Take 1 tablet (40 mg total) by mouth 2 (two) times daily.   RA Probiotic Digestive Care Caps Take 1 capsule by mouth daily.   rifaximin 550 MG Tabs tablet Commonly known as: XIFAXAN Take 550 mg by mouth 2 (two) times daily. Takes with lactulose   Semaglutide(0.25 or 0.5MG /DOS) 2 MG/1.5ML Sopn Inject 0.5 mg into the skin once a week.   terazosin 5 MG capsule Commonly known as: HYTRIN Take 5 mg by mouth at bedtime.   trolamine salicylate 10 % cream Commonly known as: ASPERCREME Apply topically as needed for muscle pain.        Contact information for after-discharge care     Destination     [facility] Preferred SNF .   Service: Skilled Nursing Contact information: [address] [place] [address] [phone]                    Allergies  Allergen Reactions   Nsaids     Other Reaction(s): Ascites   Camphor Rash and Swelling   Nickel Rash   Sulfur Rash and Swelling   Sulfamethoxazole Swelling   Latex Rash   Lisinopril Hives    Other Reaction(s): Cough    Consultations: ID  Cardio    Procedures/Studies: CT MAXILLOFACIAL W  CONTRAST  Result Date: 04/09/2023 CLINICAL DATA:  Unclear source of strep bacteremia with concerns for dental infection. EXAM: CT MAXILLOFACIAL WITH CONTRAST TECHNIQUE: Multidetector CT imaging of the maxillofacial structures was performed with intravenous contrast. Multiplanar CT image reconstructions were also generated. RADIATION DOSE REDUCTION: This exam was performed according to the departmental dose-optimization program which includes automated exposure control, adjustment of the mA and/or kV according to patient size and/or use of iterative reconstruction technique. CONTRAST:  75mL OMNIPAQUE  IOHEXOL 300 MG/ML  SOLN COMPARISON:  Maxillofacial CT 01/22/2019. FINDINGS: Osseous: Extensive dental caries and periapical lucencies of the remaining maxillary and mandibular teeth. Fractured right lateral maxillary incisor. Orbits: Negative. No traumatic or inflammatory finding. Sinuses: Well aerated. Soft tissues: Unremarkable. Limited intracranial: Unremarkable. IMPRESSION: Extensive dental caries and periapical lucencies of the remaining maxillary and mandibular teeth. Fractured right lateral maxillary incisor. Electronically Signed   By: Orvan Falconer M.D.   On: 04/09/2023 15:49   ECHO TEE  Result Date: 04/09/2023    TRANSESOPHOGEAL ECHO REPORT   Patient Name:   Joshua Berry Date of Exam: 04/09/2023 Medical Rec #:  [id number]     Height:       74.0 in Accession #:    [id number]    Weight:       230.2 lb Date of Birth:  1946-01-23     BSA:          2.307 m Patient Age:    60 years      BP:           119/54 mmHg Patient Gender: M             HR:           66 bpm. Exam Location:  [facility] Procedure: Transesophageal Echo, Cardiac Doppler and Color Doppler Indications:     Bacteremia R78.81  History:         Patient has prior history of Echocardiogram examinations, most                  recent 04/06/2023. Signs/Symptoms:Murmur; Risk                  Factors:Hypertension and Diabetes. S/P TAVR.                  Aortic  Valve: bioprosthetic valve is present in the aortic                  position.  Sonographer:     Cristela Blue Referring Phys:  [id number] Dois Davenport BERGE Diagnosing Phys: Debbe Odea MD PROCEDURE: The transesophogeal probe was passed without difficulty through the esophogus of the patient. Sedation performed by performing physician. The patient developed no complications during the procedure.  IMPRESSIONS  1. Left ventricular ejection fraction, by estimation, is 55 to 60%. The left ventricle has normal function.  2. Right ventricular systolic function is normal. The right ventricular size is normal.  3. No left atrial/left atrial appendage thrombus was detected.  4. The mitral valve is normal in structure. Trivial mitral valve regurgitation.  5. The aortic valve has been repaired/replaced. Aortic valve regurgitation is mild. 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  6. Aortic dilatation noted. There is mild dilatation of the aortic root, measuring 43 mm. Conclusion(s)/Recommendation(s): No evidence of vegetation/infective endocarditis on this transesophageael echocardiogram. FINDINGS  Left Ventricle: Left ventricular ejection fraction, by estimation, is 55 to 60%. The left ventricle has normal function. The left ventricular internal cavity size was normal in size. Right Ventricle: The right ventricular size is normal. No increase in right ventricular wall thickness. Right ventricular systolic function is normal. Left Atrium: Left atrial size was normal in size. No left atrial/left atrial appendage thrombus was detected. Right Atrium: Right atrial size was normal in size. Pericardium: There is no evidence of pericardial effusion. Mitral Valve: The mitral valve is normal in structure. Trivial mitral valve regurgitation. Tricuspid Valve: The tricuspid valve is normal in  structure. Tricuspid valve regurgitation is mild. Aortic Valve: The aortic valve has been repaired/replaced. Aortic valve  regurgitation is mild. 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 Pulmonic Valve: The pulmonic valve was normal in structure. Pulmonic valve regurgitation is not visualized. Aorta: Aortic dilatation noted. There is mild dilatation of the aortic root, measuring 43 mm. IAS/Shunts: No atrial level shunt detected by color flow Doppler. Debbe Odea MD Electronically signed by Debbe Odea MD Signature Date/Time: 04/09/2023/1:18:56 PM    Final    MR SHOULDER RIGHT WO CONTRAST  Result Date: 04/09/2023 CLINICAL DATA:  Shoulder pain. Septic arthritis suspected. Osteomyelitis suspected. EXAM: MRI OF THE RIGHT SHOULDER WITHOUT CONTRAST TECHNIQUE: Multiplanar, multisequence MR imaging of the shoulder was performed. No intravenous contrast was administered. COMPARISON:  Right shoulder MRI 01/02/2019. FINDINGS: Despite efforts by the technologist and patient, mild motion artifact is present on today's exam and could not be eliminated. This reduces exam sensitivity and specificity. Rotator cuff: Progressive infraspinatus tendinosis with interval development of a small intrasubstance insertional tear extending to the bursal surface, best seen on coronal image 10/9. No full-thickness component or tendon retraction identified. There is mild supraspinatus tendinosis. The subscapularis and teres minor tendons appear normal. Muscles:  No focal muscular atrophy or edema. Biceps long head: Mild tendinosis. The tendon is intact and normally located. Acromioclavicular Joint: The acromion is type 2. There are mild acromioclavicular degenerative changes. No significant fluid is present in the subacromial - subdeltoid bursa. Glenohumeral Joint: Chronic advanced glenohumeral arthropathy which has progressed from the previous study. There is diffuse chondral thinning with osteophyte and subchondral cyst formation. Subchondral edema in the glenoid has improved compared with the prior study, and no bone  destruction is identified. There is a small shoulder joint effusion with most of the fluid anteriorly in the superior subscapularis recess and the bicipital groove. Labrum: Diffusely degenerated with degenerative tearing superiorly and posteriorly. No significant paralabral cyst. Bones: Chronic glenohumeral arthropathy without findings highly suspicious for septic arthritis or osteomyelitis. Increased subcortical cyst formation in the humeral head near the supraspinatus insertion. Other: No periarticular inflammatory changes or fluid collections are demonstrated to suggest septic arthritis. IMPRESSION: 1. Chronic advanced glenohumeral arthropathy which has progressed from the prior study of 01/02/2019. No specific evidence of septic arthritis or osteomyelitis. 2. Progressive degenerative tearing of the labrum posteriorly and superiorly. 3. Progressive infraspinatus tendinosis with small intrasubstance insertional tear. No full-thickness rotator cuff tear, tendon retraction or focal muscular atrophy. 4. Mild bicipital tendinosis. Electronically Signed   By: Carey Bullocks M.D.   On: 04/09/2023 08:21   MR THORACIC SPINE WO CONTRAST  Result Date: 04/07/2023 CLINICAL DATA:  Mid back pain. Infection suspected. Abnormal x-ray/CT. Cirrhosis. Altered mental status. EXAM: MRI THORACIC SPINE WITHOUT CONTRAST TECHNIQUE: Multiplanar, multisequence MR imaging of the thoracic spine was performed. No intravenous contrast was administered. COMPARISON:  Chest CT 2 days ago. Lumbar MRI 05/17/2022. Chest CT 04/06/2022 chest CT 05/14/2022. FINDINGS: Alignment:  No significant malalignment. Vertebrae: Old inferior endplate fracture at T5 and superior endplate fracture at T6. Some bone marrow edema in this region that is likely related to subsequent degenerative change. No T2 bright material in the disc space. Old compression fracture at T12 with more recent worsening in the inferior T12 vertebral body portion, also with development  of a Schmorl's node at the superior endplate of L1. Similarly, this is favored to be extension of previous fracture more likely than worsening changes related to spinal infection. See below. Cord:  No primary cord lesion.  See below regarding stenosis. Paraspinal and other soft tissues: No paravertebral inflammatory changes are appreciated by MRI. Disc levels: T1-2: Minor disc bulge.  Mild facet degeneration.  No stenosis. T2-3: Moderate disc bulge and facet degeneration. No compressive stenosis. T3-4: Normal interspace. T4-5: Minimal disc bulge.  No stenosis. T5-6: Chronic disc herniation with calcification. Narrowing of the canal with slight deformity of the cord. Some subarachnoid space does surround the cord. 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 T6-7: Chronic shallow disc protrusion with partial calcification. Narrowing of the ventral subarachnoid space but no compression of the cord. T7-8: Chronic shallow protrusion more prominent towards the left with partial calcification. Narrowing of the ventral subarachnoid space but no compression of the cord. T8-9: Small central disc protrusion but no neural compression. T9-10: Small central to left posterolateral disc protrusion but no neural compression. T10-11: Endplate osteophytes and shallow protrusion of the disc. Facet and ligamentous hypertrophy. Canal narrowing with narrowing of the subarachnoid space surrounding the cord. T11-12: Disc bulge.  No stenosis. T12-L1: Disc bulge.  No stenosis. IMPRESSION: 1. Old inferior endplate fracture at T5 and superior endplate fracture at T6. Some bone marrow edema in this region is likely related to subsequent degenerative change. No T2 bright material in the disc space to suggest the presence of infection. If there are strong clinical indications of infection, 1 could consider a follow-up scan in several weeks. I do not think it is possible to completely exclude infection at this point in time. 2. Old compression  fracture at T12 with more recent worsening in the inferior T12 vertebral body portion, also with development of a Schmorl's node at the superior endplate of L1. Similarly, this is favored to be extension of previous fracture more likely than worsening changes related to spinal infection. As above, if concern persists this could be re-evaluated in several weeks. I do not think it is possible to completely exclude infection at this point in time. 3. 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 Narrowing of the ventral subarachnoid space but no compression of the cord. Electronically Signed   By: Paulina Fusi M.D.   On: 04/07/2023 14:05   ECHOCARDIOGRAM COMPLETE  Result Date: 04/06/2023    ECHOCARDIOGRAM REPORT   Patient Name:   Joshua Berry Date of Exam: 04/06/2023 Medical Rec #:  [id number]     Height:       74.0 in Accession #:    [id number]    Weight:       230.2 lb Date of Birth:  1946/03/09     BSA:          2.307 m Patient Age:    77 years      BP:           119/54 mmHg Patient Gender: M             HR:           67 bpm. Exam Location:  [facility] Procedure: 2D Echo Indications:     Bacteremia R78.81  History:         Patient has prior history of Echocardiogram examinations, most                  recent 05/17/2022.                  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  Sonographer:     Overton Mam RDCS, FASE Referring Phys:  [id number] Sunnie Nielsen Diagnosing Phys: Debbe Odea MD  Sonographer Comments: Image acquisition challenging due to respiratory motion and Image acquisition challenging due to patient body habitus. IMPRESSIONS  1. Left ventricular ejection fraction, by estimation, is 55 to 60%. Left ventricular ejection fraction by PLAX is 59 %. The left ventricle has normal function. The left ventricle has no regional wall motion abnormalities. There is mild left ventricular hypertrophy. Left ventricular diastolic parameters are  consistent with Grade I diastolic dysfunction (impaired relaxation).  2. Right ventricular systolic function is normal. The right ventricular size is normal.  3. The mitral valve is degenerative. No evidence of mitral valve regurgitation.  4. The aortic valve has been repaired/replaced. Aortic valve regurgitation is mild. 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 Aortic valve mean gradient measures 11.8 mmHg. FINDINGS  Left Ventricle: Left ventricular ejection fraction, by estimation, is 55 to 60%. Left ventricular ejection fraction by PLAX is 59 %. The left ventricle has normal function. The left ventricle has no regional wall motion abnormalities. The left ventricular internal cavity size was normal in size. There is mild left ventricular hypertrophy. Left ventricular diastolic parameters are consistent with Grade I diastolic dysfunction (impaired relaxation). Right Ventricle: The right ventricular size is normal. No increase in right ventricular wall thickness. Right ventricular systolic function is normal. Left Atrium: Left atrial size was normal in size. Right Atrium: Right atrial size was normal in size. Pericardium: There is no evidence of pericardial effusion. Mitral Valve: The mitral valve is degenerative in appearance. Mild to moderate mitral annular calcification. No evidence of mitral valve regurgitation. Tricuspid Valve: The tricuspid valve is normal in structure. Tricuspid valve regurgitation is mild. Aortic Valve: The aortic valve has been repaired/replaced. Aortic valve regurgitation is mild. Aortic valve mean gradient measures 11.8 mmHg. Aortic valve peak gradient measures 21.4 mmHg. Aortic valve area, by VTI measures 1.36 cm. 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 Pulmonic Valve: The pulmonic valve was normal in structure. Pulmonic valve regurgitation is mild. Aorta: The aortic root is normal in size and structure. Venous: The inferior vena cava was not  well visualized. IAS/Shunts: No atrial level shunt detected by color flow Doppler.  LEFT VENTRICLE PLAX 2D LV EF:         Left            Diastology                ventricular     LV e' medial:    6.09 cm/s                ejection        LV E/e' medial:  17.6                fraction by     LV e' lateral:   10.40 cm/s                PLAX is 59      LV E/e' lateral: 10.3                %. LVIDd:         4.80 cm LVIDs:         3.30 cm LV PW:         1.10 cm LV IVS:        1.30 cm LVOT diam:     2.20  cm LV SV:         67 LV SV Index:   29 LVOT Area:     3.80 cm  RIGHT VENTRICLE RV Basal diam:  3.90 cm RV S prime:     13.20 cm/s TAPSE (M-mode): 2.1 cm LEFT ATRIUM             Index        RIGHT ATRIUM           Index LA diam:        3.60 cm 1.56 cm/m   RA Area:     13.40 cm LA Vol (A2C):   34.0 ml 14.74 ml/m  RA Volume:   29.50 ml  12.79 ml/m LA Vol (A4C):   31.2 ml 13.52 ml/m LA Biplane Vol: 34.1 ml 14.78 ml/m  AORTIC VALVE                     PULMONIC VALVE AV Area (Vmax):    1.40 cm      PV Vmax:        1.44 m/s AV Area (Vmean):   1.40 cm      PV Peak grad:   8.3 mmHg AV Area (VTI):     1.36 cm      RVOT Peak grad: 4 mmHg AV Vmax:           231.50 cm/s AV Vmean:          157.750 cm/s AV VTI:            0.492 m AV Peak Grad:      21.4 mmHg AV Mean Grad:      11.8 mmHg LVOT Vmax:         85.40 cm/s LVOT Vmean:        58.100 cm/s LVOT VTI:          0.176 m LVOT/AV VTI ratio: 0.36  AORTA Ao Root diam: 3.40 cm MITRAL VALVE MV Area (PHT): 2.57 cm     SHUNTS MV Decel Time: 295 msec     Systemic VTI:  0.18 m MV E velocity: 107.00 cm/s  Systemic Diam: 2.20 cm MV A velocity: 132.00 cm/s MV E/A ratio:  0.81 Debbe Odea MD Electronically signed by Debbe Odea MD Signature Date/Time: 04/06/2023/4:06:54 PM    Final    MR BRAIN WO CONTRAST  Result Date: 04/05/2023 CLINICAL DATA:  Altered mental status EXAM: MRI HEAD WITHOUT CONTRAST TECHNIQUE: Multiplanar, multiecho pulse sequences of the brain and  surrounding structures were obtained without intravenous contrast. COMPARISON:  None Available. FINDINGS: Brain: No acute infarct, mass effect or extra-axial collection. No acute or chronic hemorrhage. There is multifocal hyperintense T2-weighted signal within the white matter. Generalized volume loss. The midline structures are normal. Vascular: Major flow voids are preserved. Skull and upper cervical spine: Normal calvarium and skull base. Visualized upper cervical spine and soft tissues are normal. Sinuses/Orbits:No paranasal sinus fluid levels or advanced mucosal thickening. No mastoid or middle ear effusion. Normal orbits. IMPRESSION: 1. No acute intracranial abnormality. 2. 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 Electronically Signed   By: Deatra Robinson M.D.   On: 04/05/2023 21:28   CT CHEST ABDOMEN PELVIS W CONTRAST  Result Date: 04/05/2023 CLINICAL DATA:  Sepsis EXAM: CT CHEST, ABDOMEN, AND PELVIS WITH CONTRAST TECHNIQUE: Multidetector CT imaging of the chest, abdomen and pelvis was performed following the standard protocol during bolus administration of intravenous contrast. RADIATION DOSE REDUCTION: This exam was  performed according to the departmental dose-optimization program which includes automated exposure control, adjustment of the mA and/or kV according to patient size and/or use of iterative reconstruction technique. CONTRAST:  OMNIPAQUE IOHEXOL 300 MG/ML  SOLN COMPARISON:  Chest x-ray earlier 04/05/2023. CT angiogram chest 03/14/2022. Abdomen pelvis CT 07/20/2019. Multiple other comparison exams FINDINGS: CT CHEST FINDINGS Cardiovascular: Status post TAVR. Heart is nonenlarged. No pericardial effusion. The thoracic aorta overall has a normal course and caliber. Coronary artery calcifications are seen. Mediastinum/Nodes: Mildly patulous esophagus. No specific abnormal lymph node enlargement identified in the axillary region, hilum or mediastinum. Lungs/Pleura: No  consolidation, pneumothorax or effusion. Breathing motion seen throughout the examination. There is some dependent atelectasis seen with some also areas of scarring and fibrotic change. Musculoskeletal: Scattered degenerative changes along the spine. There are multiple posterior endplate osteophytes identified with areas of stenosis in the thoracic spine region. There is severe compression of the T12 vertebral level. This was not seen on the prior CT scan. There is some lucencies along the inferior endplate with some air. This could be a benign compression but could be more acute to subacute. Please correlate with clinical history. Streak artifact related to the patient's left shoulder arthroplasty. Gynecomastia. CT ABDOMEN PELVIS FINDINGS Hepatobiliary: Nodular liver consistent with chronic liver disease. 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 Main portal vein is patent with diameter at the liver hilum approaching 18 mm. Pancreas: Unremarkable. No pancreatic ductal dilatation or surrounding inflammatory changes. Spleen: Small splenule at the hilum. Spleen has a cephalocaudal length approaching 20 cm. Preserved enhancement. Adrenals/Urinary Tract: Adrenal glands are preserved. Stable lower pole left-sided renal cyst with a parapelvic component measuring 4 cm. Bosniak 1 lesion. There are some small exophytic similar cysts on the right side. No specific follow-up. Preserved contours of the urinary bladder. Mild bladder wall thickening. Stomach/Bowel: Scattered colonic stool. There is scattered diffuse colonic diverticula. Bowel is nondilated. Stomach is underdistended. Small bowel is nondilated. Vascular/Lymphatic: Normal caliber aorta and IVC with scattered mild vascular calcifications. No specific abnormal lymph node enlargement seen in the abdomen and pelvis. Reproductive: Enlarged prostate with mass effect along the base of the bladder. Other: Trace ascites in the pelvis. There is motion  throughout the examination limiting evaluation particularly of the bowel and mesentery. Musculoskeletal: Advanced degenerative changes along the spine with multilevel stenosis. Again compression of T12. streak artifact as the patient's arms were scanned at the patient's side across the abdomen and pelvis. IMPRESSION: Evidence of chronic liver disease with a nodular heterogeneous liver, splenomegaly. Trace ascites. Patent tips shunt. Colonic diverticulosis. No bowel obstruction or free air. No rim enhancing fluid collections. No consolidation, pneumothorax or effusion. Severe compression of the T12 level which was not seen previously. Based on appearance this could be subacute. Please correlate with specific clinical history and symptomatology. Electronically Signed   By: Karen Kays M.D.   On: 04/05/2023 20:36   CT Head Wo Contrast  Result Date: 04/05/2023 CLINICAL DATA:  Mental status change, unknown cause EXAM: CT HEAD WITHOUT CONTRAST TECHNIQUE: Contiguous axial images were obtained from the base of the skull through the vertex without intravenous contrast. RADIATION DOSE REDUCTION: This exam was performed according to the departmental dose-optimization program which includes automated exposure control, adjustment of the mA and/or kV according to patient size and/or use of iterative reconstruction technique. COMPARISON:  08/02/2022 FINDINGS: Brain: No evidence of acute infarction, hemorrhage, hydrocephalus, extra-axial collection or mass lesion/mass effect. Patchy low-density changes within the periventricular  and subcortical white matter most compatible with chronic microvascular ischemic change. Mild diffuse cerebral volume loss. Vascular: No hyperdense vessel or unexpected calcification. Skull: Normal. Negative for fracture or focal lesion. Sinuses/Orbits: No acute finding. Other: None. IMPRESSION: 1. No acute intracranial abnormality. 2. Chronic microvascular ischemic change and cerebral volume loss.  Electronically Signed   By: Duanne Guess D.O.   On: 04/05/2023 17:13   DG Chest Port 1 View  Result Date: 04/05/2023 CLINICAL DATA:  Altered mental status EXAM: PORTABLE CHEST 1 VIEW COMPARISON:  CXR 07/31/22 FINDINGS: Status post TAVR. Possible trace left pleural effusion. No pneumothorax unchanged cardiac and mediastinal contours. No focal airspace opacity. There are prominent interstitial opacities, unchanged from prior exam. No radiographically apparent displaced rib fractures. Visualized upper abdomen is unremarkable. Surgical clips in the right upper quadrant. IMPRESSION: No focal airspace opacity. Electronically Signed   By: Lorenza Cambridge M.D.   On: 04/05/2023 16:52   (Echo, Carotid, EGD, Colonoscopy, ERCP)    Subjective: Pt c/o intermittent back pain.    Discharge Exam: Vitals:   04/16/23 1958 04/17/23 0436  BP: (!) 113/57 (!) 101/54  Pulse: 65 (!) 56  Resp: 16 18  Temp: 98.7 F (37.1 C) 98.8 F (37.1 C)  SpO2: 100% 97%   Vitals:   04/16/23 0724 04/16/23 1552 04/16/23 1958 04/17/23 0436  BP: 103/60 (!) 102/50 (!) 113/57 (!) 101/54  Pulse: 61 64 65 (!) 56  Resp: 16 18 16 18   Temp: 98.1 F (36.7 C) 98.4 F (36.9 C) 98.7 F (37.1 C) 98.8 F (37.1 C)  TempSrc:      SpO2: 98% 98% 100% 97%  Weight:      Height:        General: Pt is alert, awake, not in acute distress Cardiovascular:  S1/S2 +, no rubs, no gallops Respiratory: CTA bilaterally, no wheezing, no rhonchi Abdominal: Soft, NT, ND, bowel sounds + Extremities:  no cyanosis    The results of significant diagnostics from this hospitalization (including imaging, microbiology, ancillary and laboratory) are listed below for reference.     Microbiology: Recent Results (from the past 240 hour(s))  Culture, blood (Routine X 2) w Reflex to ID Panel     Status: None   Collection Time: 04/07/23 12:41 PM   Specimen: BLOOD  Result Value Ref Range Status   Specimen Description BLOOD LEFT ANTECUBITAL  Final    Special Requests   Final    BOTTLES DRAWN AEROBIC AND ANAEROBIC Blood Culture adequate volume   Culture   Final    NO GROWTH 5 DAYS Performed at [facility], [address], [place], [place] [address]    Report Status 04/12/2023 FINAL  Final  Culture, blood (Routine X 2) w Reflex to ID Panel     Status: None   Collection Time: 04/07/23 12:41 PM   Specimen: BLOOD  Result Value Ref Range Status   Specimen Description BLOOD RIGHT ANTECUBITAL  Final   Special Requests   Final    BOTTLES DRAWN AEROBIC AND ANAEROBIC Blood Culture adequate volume   Culture   Final    NO GROWTH 5 DAYS Performed at [facility], [address], [place], [place] [address]    Report Status 04/12/2023 FINAL  Final     Labs: BNP (last 3 results) Recent Labs    08/03/22 0429  BNP 47.7   Basic Metabolic Panel: Recent Labs  Lab 04/12/23 0437 04/14/23 0549  NA 134* 137  K 3.5 3.6  CL  103 105  CO2 24 25  GLUCOSE 111* 109*  BUN 12 15  CREATININE 0.81 0.89  CALCIUM 8.7* 8.6*   Liver Function Tests: No results for input(s): "AST", "ALT", "ALKPHOS", "BILITOT", "PROT", "ALBUMIN" in the last 168 hours. No results for input(s): "LIPASE", "AMYLASE" in the last 168 hours. No results for input(s): "AMMONIA" in the last 168 hours. CBC: Recent Labs  Lab 04/11/23 0608 04/14/23 0549  WBC 5.0 2.7*  HGB 12.8* 11.5*  HCT 36.2* 32.7*  MCV 90.7 92.9  PLT 57* 58*   Cardiac Enzymes: No results for input(s): "CKTOTAL", "CKMB", "CKMBINDEX", "TROPONINI" in the last 168 hours. BNP: Invalid input(s): "POCBNP" CBG: Recent Labs  Lab 04/16/23 0725 04/16/23 1137 04/16/23 1536 04/16/23 2000 04/17/23 0809  GLUCAP 112* 140* 220* 154* 92   D-Dimer No results for input(s): "DDIMER" in the last 72 hours. Hgb A1c No results for input(s): "HGBA1C" in the last 72 hours. Lipid Profile No results for input(s): "CHOL", "HDL", "LDLCALC", "TRIG", "CHOLHDL", "LDLDIRECT" in the last 72  hours. Thyroid function studies No results for input(s): "TSH", "T4TOTAL", "T3FREE", "THYROIDAB" in the last 72 hours.  Invalid input(s): "FREET3" Anemia work up No results for input(s): "VITAMINB12", "FOLATE", "FERRITIN", "TIBC", "IRON", "RETICCTPCT" in the last 72 hours. Urinalysis    Component Value Date/Time   COLORURINE YELLOW (A) 04/06/2023 0217   APPEARANCEUR CLEAR (A) 04/06/2023 0217   APPEARANCEUR Hazy 05/01/2014 1653   LABSPEC >1.046 (H) 04/06/2023 0217   LABSPEC 1.015 05/01/2014 1653   PHURINE 5.0 04/06/2023 0217   GLUCOSEU NEGATIVE 04/06/2023 0217   GLUCOSEU Negative 05/01/2014 1653   HGBUR SMALL (A) 04/06/2023 0217   BILIRUBINUR NEGATIVE 04/06/2023 0217   BILIRUBINUR Negative 05/01/2014 1653   KETONESUR NEGATIVE 04/06/2023 0217   PROTEINUR 30 (A) 04/06/2023 0217   NITRITE NEGATIVE 04/06/2023 0217   LEUKOCYTESUR NEGATIVE 04/06/2023 0217   LEUKOCYTESUR Negative 05/01/2014 1653   Sepsis Labs Recent Labs  Lab 04/11/23 0608 04/14/23 0549  WBC 5.0 2.7*   Microbiology Recent Results (from the past 240 hour(s))  Culture, blood (Routine X 2) w Reflex to ID Panel     Status: None   Collection Time: 04/07/23 12:41 PM   Specimen: BLOOD  Result Value Ref Range Status   Specimen Description BLOOD LEFT ANTECUBITAL  Final   Special Requests   Final    BOTTLES DRAWN AEROBIC AND ANAEROBIC Blood Culture adequate volume   Culture   Final    NO GROWTH 5 DAYS Performed at [facility], [address] [place], [place] [address]    Report Status 04/12/2023 FINAL  Final  Culture, blood (Routine X 2) w Reflex to ID Panel     Status: None   Collection Time: 04/07/23 12:41 PM   Specimen: BLOOD  Result Value Ref Range Status   Specimen Description BLOOD RIGHT ANTECUBITAL  Final   Special Requests   Final    BOTTLES DRAWN AEROBIC AND ANAEROBIC Blood Culture adequate volume   Culture   Final    NO GROWTH 5 DAYS Performed at [facility], [address], [name], [place] [address]    Report Status 04/12/2023 FINAL  Final     Time coordinating discharge: Over 30 minutes  SIGNED:   Charise Killian, MD  [facility] 04/17/2023, 11:28 AM Pager   If 7PM-7AM, please contact night-coverage [email or web address]

## 2023-04-17 NOTE — TOC Transition Note (Signed)
Transition of Care [facility]) - CM/SW Discharge Note   Patient Details  Name: Joshua Berry MRN: [id number] Date of Birth: 1946-02-27  Transition of [facility]) CM/SW Contact:  Allena Katz, LCSW Phone Number: 04/17/2023, 12:53 PM   Clinical Narrative:  Pt discharging today to [facility]. [facility] called patient second on the list. DC summary sent with patient. CSW signing off.            Patient Goals and CMS Choice      Discharge Placement                         Discharge Plan and Services Additional resources added to the After Visit Summary for                                       Social Determinants of Health (SDOH) Interventions SDOH Screenings   Food Insecurity: No Food Insecurity (04/06/2023)  Housing: Low Risk  (04/06/2023)  Transportation Needs: No Transportation Needs (04/06/2023)  Utilities: Not At Risk (04/06/2023)  Depression (PHQ2-9): Low Risk  (01/07/2022)  Recent Concern: Depression (PHQ2-9) - Medium Risk (12/04/2021)  Tobacco Use: Medium Risk (04/11/2023)     Readmission Risk Interventions    05/17/2022    1:45 PM  Readmission Risk Prevention Plan  Transportation Screening Complete  PCP or Specialist Appt within 5-7 Days Complete  Home Care Screening Complete  Medication Review (RN CM) Complete

## 2023-04-24 ENCOUNTER — Ambulatory Visit: Payer: No Typology Code available for payment source | Attending: Infectious Diseases | Admitting: Infectious Diseases

## 2023-04-24 ENCOUNTER — Encounter: Payer: Self-pay | Admitting: Infectious Diseases

## 2023-04-24 VITALS — BP 103/64 | HR 66 | Temp 98.4°F

## 2023-04-24 DIAGNOSIS — Z96611 Presence of right artificial shoulder joint: Secondary | ICD-10-CM | POA: Insufficient documentation

## 2023-04-24 DIAGNOSIS — Z952 Presence of prosthetic heart valve: Secondary | ICD-10-CM | POA: Diagnosis not present

## 2023-04-24 DIAGNOSIS — K7682 Hepatic encephalopathy: Secondary | ICD-10-CM | POA: Insufficient documentation

## 2023-04-24 DIAGNOSIS — K7460 Unspecified cirrhosis of liver: Secondary | ICD-10-CM | POA: Insufficient documentation

## 2023-04-24 DIAGNOSIS — R188 Other ascites: Secondary | ICD-10-CM | POA: Diagnosis not present

## 2023-04-24 DIAGNOSIS — B955 Unspecified streptococcus as the cause of diseases classified elsewhere: Secondary | ICD-10-CM | POA: Diagnosis not present

## 2023-04-24 DIAGNOSIS — G4733 Obstructive sleep apnea (adult) (pediatric): Secondary | ICD-10-CM | POA: Insufficient documentation

## 2023-04-24 DIAGNOSIS — Z96612 Presence of left artificial shoulder joint: Secondary | ICD-10-CM | POA: Insufficient documentation

## 2023-04-24 DIAGNOSIS — Z79899 Other long term (current) drug therapy: Secondary | ICD-10-CM | POA: Diagnosis not present

## 2023-04-24 DIAGNOSIS — R7881 Bacteremia: Secondary | ICD-10-CM | POA: Diagnosis present

## 2023-04-24 DIAGNOSIS — Z9049 Acquired absence of other specified parts of digestive tract: Secondary | ICD-10-CM | POA: Diagnosis not present

## 2023-04-24 DIAGNOSIS — I10 Essential (primary) hypertension: Secondary | ICD-10-CM | POA: Insufficient documentation

## 2023-04-24 DIAGNOSIS — Z794 Long term (current) use of insulin: Secondary | ICD-10-CM | POA: Insufficient documentation

## 2023-04-24 DIAGNOSIS — E119 Type 2 diabetes mellitus without complications: Secondary | ICD-10-CM | POA: Diagnosis not present

## 2023-04-24 DIAGNOSIS — Z79890 Hormone replacement therapy (postmenopausal): Secondary | ICD-10-CM | POA: Diagnosis not present

## 2023-04-24 DIAGNOSIS — Z87891 Personal history of nicotine dependence: Secondary | ICD-10-CM | POA: Insufficient documentation

## 2023-04-24 DIAGNOSIS — E785 Hyperlipidemia, unspecified: Secondary | ICD-10-CM | POA: Diagnosis not present

## 2023-04-24 DIAGNOSIS — K7581 Nonalcoholic steatohepatitis (NASH): Secondary | ICD-10-CM | POA: Diagnosis not present

## 2023-04-24 NOTE — Progress Notes (Signed)
NAME: Joshua Berry  DOB: Aug 10, 1946  MRN: [id number]  Date/Time: 04/24/2023 12:06 PM   Subjective:   ? Joshua Berry is a 77 y.o. 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 Staph capri bacteremia in June 2023 treated with 4 weeks of Iv cefazolin Was recently in [facility] for streptococcus anginosus bacteremia, negative TEE, negative MRI thoracic pine and rt shoulder for infection He was discharged on PO Amoxicillin 1 gram TID by Dr.Manandhar for 2 weeks . HE is now in [employer]. He completed the amoxicillin on 04/21/23 and today he is here for follow up with his daughter Doing better No fever or chills No sob No cough No pain abdomen No dysuria    Past Medical History:  Diagnosis Date   Arthritis    Bleeding ulcer    Cancer (HCC)    melanoma, carcinoma   Cirrhosis of liver (HCC)    Diabetes mellitus without complication (HCC)    Heart murmur    Hypertension    Iron deficiency anemia    NASH (nonalcoholic steatohepatitis)    Sleep apnea    Spleen enlarged    Thrombocytopenia (HCC)     Past Surgical History:  Procedure Laterality Date   CHOLECYSTECTOMY     COLONOSCOPY N/A 07/23/2019   Procedure: COLONOSCOPY;  Surgeon: Toney Reil, MD;  Location: [facility] ENDOSCOPY;  Service: Gastroenterology;  Laterality: N/A;   ESOPHAGOGASTRODUODENOSCOPY N/A 07/20/2019   Procedure: ESOPHAGOGASTRODUODENOSCOPY (EGD);  Surgeon: Toney Reil, MD;  Location: [facility] ENDOSCOPY;  Service: Gastroenterology;  Laterality: N/A;   ESOPHAGOGASTRODUODENOSCOPY Left 08/20/2019   Procedure: ESOPHAGOGASTRODUODENOSCOPY (EGD);  Surgeon: Pasty Spillers, MD;  Location: [facility] ENDOSCOPY;  Service: Endoscopy;   Laterality: Left;   ESOPHAGOGASTRODUODENOSCOPY (EGD) WITH PROPOFOL N/A 04/25/2021   Procedure: ESOPHAGOGASTRODUODENOSCOPY (EGD) WITH PROPOFOL;  Surgeon: Toney Reil, MD;  Location: [facility] ENDOSCOPY;  Service: Gastroenterology;  Laterality: N/A;   MECHANICAL AORTIC VALVE REPLACEMENT     SKIN BIOPSY     TEE WITHOUT CARDIOVERSION N/A 12/23/2018   Procedure: TRANSESOPHAGEAL ECHOCARDIOGRAM (TEE);  Surgeon: Dalia Heading, MD;  Location: [facility] ORS;  Service: Cardiovascular;  Laterality: N/A;   TEE WITHOUT CARDIOVERSION N/A 05/21/2022   Procedure: TRANSESOPHAGEAL ECHOCARDIOGRAM (TEE);  Surgeon: Antonieta Iba, MD;  Location: [facility] ORS;  Service: Cardiovascular;  Laterality: N/A;   TEE WITHOUT CARDIOVERSION N/A 04/09/2023   Procedure: TRANSESOPHAGEAL ECHOCARDIOGRAM;  Surgeon: Debbe Odea, MD;  Location: [facility] ORS;  Service: Cardiovascular;  Laterality: N/A;    Social History   Socioeconomic History   Marital status: Married    Spouse name: Soroush Dai   Number of children: Not on file   Years of education: Not on file   Highest education level: Not on file  Occupational History   Not on file  Tobacco Use   Smoking status: Former    Packs/day: 0.50    Years: 5.00    Additional pack years: 0.00    Total pack years: 2.50    Types: Cigarettes    Quit date: 10/16/1968    Years since quitting: 54.5   Smokeless tobacco: Never  Vaping Use   Vaping Use: Never used  Substance and Sexual Activity   Alcohol use: Not Currently   Drug use:  Not Currently   Sexual activity: Not Currently  Other Topics Concern   Not on file  Social History Narrative   Not on file   Social Determinants of Health   Financial Resource Strain: Not on file  Food Insecurity: No Food Insecurity (04/06/2023)   Hunger Vital Sign    Worried About Running Out of Food in the Last Year: Never true    Ran Out of Food in the Last Year: Never true  Transportation Needs: No Transportation Needs (04/06/2023)    PRAPARE - Administrator, Civil Service (Medical): No    Lack of Transportation (Non-Medical): No  Physical Activity: Not on file  Stress: Not on file  Social Connections: Not on file  Intimate Partner Violence: Unknown (04/06/2023)   Humiliation, Afraid, Rape, and Kick questionnaire    Fear of Current or Ex-Partner: No    Emotionally Abused: No    Physically Abused: No    Sexually Abused: Not on file    Family History  Problem Relation Age of Onset   Heart attack Brother    Allergies  Allergen Reactions   Nsaids     Other Reaction(s): Ascites   Camphor Rash and Swelling   Nickel Rash   Sulfur Rash and Swelling   Sulfamethoxazole Swelling   Latex Rash   Lisinopril Hives    Other Reaction(s): Cough   I? Current Outpatient Medications  Medication Sig Dispense Refill   ASPERCREME LIDOCAINE 4 % LIQD Apply 1 Application topically daily as needed.     atorvastatin (LIPITOR) 20 MG tablet Take 20 mg by mouth at bedtime.     augmented betamethasone dipropionate (DIPROLENE-AF) 0.05 % cream Apply 1 application topically 2 (two) times daily.     Cholecalciferol 50 MCG (2000 UT) TABS Take 1 tablet by mouth daily.     famotidine (PEPCID) 20 MG tablet Take 20 mg by mouth daily.     folic acid (FOLVITE) 1 MG tablet Take 1 tablet by mouth daily.     furosemide (LASIX) 20 MG tablet Take 1 tablet (20 mg total) by mouth daily. 30 tablet 1   insulin aspart (NOVOLOG) 100 UNIT/ML injection Inject 10 Units into the skin 3 (three) times daily before meals. (Patient taking differently: Inject 16-22 Units into the skin 3 (three) times daily before meals. (Based on blood glucose reading)) 10 mL 11   insulin glargine (LANTUS) 100 UNIT/ML injection Inject 0.14 mLs (14 Units total) into the skin daily.     ketotifen (ZADITOR) 0.025 % ophthalmic solution Place 1 drop into both eyes 2 (two) times daily.     Lactobacillus Rhamnosus, GG, (RA PROBIOTIC DIGESTIVE CARE) CAPS Take 1 capsule by mouth  daily.     lactulose (CHRONULAC) 10 GM/15ML solution Take 45 mLs (30 g total) by mouth 2 (two) times daily. 946 mL 12   levothyroxine (SYNTHROID) 100 MCG tablet Take 100 mcg by mouth daily before breakfast.     lidocaine (LIDODERM) 5 % Place 2 patches onto the skin daily. Remove & Discard patch within 12 hours or as directed by MD  0   magnesium oxide (MAG-OX) 400 MG tablet Take 2 tablets (800 mg total) by mouth 2 (two) times daily.     Melatonin 10 MG TABS Take 10 mg by mouth at bedtime.     Omega-3 Fatty Acids (FISH OIL) 1000 MG CAPS Take 1 capsule by mouth 2 (two) times daily.     rifaximin (XIFAXAN) 550 MG TABS tablet Take 550  mg by mouth 2 (two) times daily. Takes with lactulose     Semaglutide,0.25 or 0.5MG /DOS, 2 MG/1.5ML SOPN Inject 0.5 mg into the skin once a week.     terazosin (HYTRIN) 5 MG capsule Take 5 mg by mouth at bedtime.     trolamine salicylate (ASPERCREME) 10 % cream Apply topically as needed for muscle pain. 85 g 0   vitamin C (ASCORBIC ACID) 500 MG tablet Take 1,000 mg by mouth daily.     pantoprazole (PROTONIX) 40 MG tablet Take 1 tablet (40 mg total) by mouth 2 (two) times daily. 30 tablet 3   No current facility-administered medications for this visit.     Abtx:  Anti-infectives (From admission, onward)    None       REVIEW OF SYSTEMS:  Const: negative fever, negative chills, negative weight loss Eyes: negative diplopia or visual changes, negative eye pain ENT: negative coryza, negative sore throat Resp: negative cough, hemoptysis, dyspnea Cards: negative for chest pain, palpitations, lower extremity edema GU: negative for frequency, dysuria and hematuria GI: Negative for abdominal pain, diarrhea, bleeding, constipation Skin: negative for rash and pruritus Heme: negative for easy bruising and gum/nose bleeding MS: weakness, getting PT Neurolo:negative for headaches, dizziness, vertigo, memory problems  Psych: negative for feelings of anxiety, depression   Endocrine: negative for thyroid, diabetes Allergy/Immunology- as above Objective:  VITALS:  BP 103/64   Pulse 66   Temp 98.4 F (36.9 C) (Temporal)   SpO2 96%   PHYSICAL EXAM:  General: Alert, cooperative, no distress, appears stated age.  Head: Normocephalic, without obvious abnormality, atraumatic. Eyes: Conjunctivae clear, anicteric sclerae. Pupils are equal ENT Nares normal. No drainage or sinus tenderness. Lips, mucosa, and tongue normal. No Thrush Neck: Supple, symmetrical, no adenopathy, thyroid: non tender no carotid bruit and no JVD. Back: No CVA tenderness. Lungs: Clear to auscultation bilaterally. No Wheezing or Rhonchi. No rales. Heart: s1s2 systolic murmur Abdomen: Soft, non-tender,not distended. Bowel sounds normal. No masses Extremities: atraumatic, no cyanosis. No edema. No clubbing Skin: No rashes or lesions. Or bruising Lymph: Cervical, supraclavicular normal. Neurologic: Grossly non-focal Pt is in wheel chair   ? Impression/Recommendation  Streptococcus anginosus bacteremia with neg TEE and negative MRI thoracic spine and shoulder. Completed 2 weeks of antibiotic on 04/21/23. Will get a blood cultures- 10 days post antibiotic 05/01/23 ? 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 ? ___________________________________________________ Discussed with patient, and daughter Informed [facility] regarding blood culture Follow PRN Note:  This document was prepared using Dragon voice recognition software and may include unintentional dictation errors.

## 2023-06-16 ENCOUNTER — Other Ambulatory Visit: Payer: Self-pay

## 2023-06-16 ENCOUNTER — Inpatient Hospital Stay
Admission: EM | Admit: 2023-06-16 | Discharge: 2023-06-18 | DRG: 442 | Disposition: A | Discharge status: Nursing Home (Includes ICF) | Payer: No Typology Code available for payment source | Source: Skilled Nursing Facility | Attending: Internal Medicine | Admitting: Internal Medicine

## 2023-06-16 DIAGNOSIS — E119 Type 2 diabetes mellitus without complications: Secondary | ICD-10-CM | POA: Diagnosis present

## 2023-06-16 DIAGNOSIS — R5383 Other fatigue: Secondary | ICD-10-CM | POA: Diagnosis not present

## 2023-06-16 DIAGNOSIS — K7460 Unspecified cirrhosis of liver: Secondary | ICD-10-CM | POA: Diagnosis present

## 2023-06-16 DIAGNOSIS — I444 Left anterior fascicular block: Secondary | ICD-10-CM | POA: Diagnosis present

## 2023-06-16 DIAGNOSIS — K7682 Hepatic encephalopathy: Principal | ICD-10-CM | POA: Diagnosis present

## 2023-06-16 DIAGNOSIS — I440 Atrioventricular block, first degree: Secondary | ICD-10-CM | POA: Diagnosis present

## 2023-06-16 DIAGNOSIS — Z85820 Personal history of malignant melanoma of skin: Secondary | ICD-10-CM

## 2023-06-16 DIAGNOSIS — E7220 Disorder of urea cycle metabolism, unspecified: Principal | ICD-10-CM

## 2023-06-16 DIAGNOSIS — I10 Essential (primary) hypertension: Secondary | ICD-10-CM | POA: Diagnosis present

## 2023-06-16 DIAGNOSIS — Z952 Presence of prosthetic heart valve: Secondary | ICD-10-CM

## 2023-06-16 DIAGNOSIS — M1990 Unspecified osteoarthritis, unspecified site: Secondary | ICD-10-CM | POA: Diagnosis present

## 2023-06-16 DIAGNOSIS — K529 Noninfective gastroenteritis and colitis, unspecified: Secondary | ICD-10-CM | POA: Diagnosis present

## 2023-06-16 DIAGNOSIS — D72819 Decreased white blood cell count, unspecified: Secondary | ICD-10-CM | POA: Diagnosis present

## 2023-06-16 DIAGNOSIS — E039 Hypothyroidism, unspecified: Secondary | ICD-10-CM | POA: Diagnosis present

## 2023-06-16 DIAGNOSIS — Z794 Long term (current) use of insulin: Secondary | ICD-10-CM

## 2023-06-16 DIAGNOSIS — E871 Hypo-osmolality and hyponatremia: Secondary | ICD-10-CM | POA: Diagnosis present

## 2023-06-16 DIAGNOSIS — Z79899 Other long term (current) drug therapy: Secondary | ICD-10-CM

## 2023-06-16 DIAGNOSIS — Z8249 Family history of ischemic heart disease and other diseases of the circulatory system: Secondary | ICD-10-CM

## 2023-06-16 DIAGNOSIS — Z7985 Long-term (current) use of injectable non-insulin antidiabetic drugs: Secondary | ICD-10-CM

## 2023-06-16 DIAGNOSIS — Z79890 Hormone replacement therapy (postmenopausal): Secondary | ICD-10-CM

## 2023-06-16 DIAGNOSIS — D61818 Other pancytopenia: Secondary | ICD-10-CM | POA: Diagnosis present

## 2023-06-16 DIAGNOSIS — K7581 Nonalcoholic steatohepatitis (NASH): Secondary | ICD-10-CM | POA: Diagnosis present

## 2023-06-16 DIAGNOSIS — Z66 Do not resuscitate: Secondary | ICD-10-CM | POA: Diagnosis present

## 2023-06-16 DIAGNOSIS — K219 Gastro-esophageal reflux disease without esophagitis: Secondary | ICD-10-CM | POA: Diagnosis present

## 2023-06-16 DIAGNOSIS — G4733 Obstructive sleep apnea (adult) (pediatric): Secondary | ICD-10-CM | POA: Diagnosis present

## 2023-06-16 DIAGNOSIS — E785 Hyperlipidemia, unspecified: Secondary | ICD-10-CM | POA: Diagnosis present

## 2023-06-16 DIAGNOSIS — L8990 Pressure ulcer of unspecified site, unspecified stage: Secondary | ICD-10-CM

## 2023-06-16 DIAGNOSIS — L89152 Pressure ulcer of sacral region, stage 2: Secondary | ICD-10-CM | POA: Diagnosis present

## 2023-06-16 DIAGNOSIS — Z87891 Personal history of nicotine dependence: Secondary | ICD-10-CM

## 2023-06-16 LAB — COMPREHENSIVE METABOLIC PANEL
ALT: 33 U/L (ref 0–44)
AST: 52 U/L — ABNORMAL HIGH (ref 15–41)
Albumin: 3.2 g/dL — ABNORMAL LOW (ref 3.5–5.0)
Alkaline Phosphatase: 113 U/L (ref 38–126)
Anion gap: 10 (ref 5–15)
BUN: 12 mg/dL (ref 8–23)
CO2: 20 mmol/L — ABNORMAL LOW (ref 22–32)
Calcium: 9.2 mg/dL (ref 8.9–10.3)
Chloride: 104 mmol/L (ref 98–111)
Creatinine, Ser: 0.96 mg/dL (ref 0.61–1.24)
GFR, Estimated: >60 mL/min (ref >60)
Glucose, Bld: 126 mg/dL — ABNORMAL HIGH (ref 70–99)
Potassium: 3.8 mmol/L (ref 3.5–5.1)
Sodium: 134 mmol/L — ABNORMAL LOW (ref 135–145)
Total Bilirubin: 1.7 mg/dL — ABNORMAL HIGH (ref 0.3–1.2)
Total Protein: 6.7 g/dL (ref 6.5–8.1)

## 2023-06-16 LAB — CBC WITH DIFFERENTIAL/PLATELET
Abs Immature Granulocytes: 0 10*3/uL (ref 0.00–0.07)
Basophils Absolute: 0 10*3/uL (ref 0.0–0.1)
Basophils Relative: 2 %
Eosinophils Absolute: 0.1 10*3/uL (ref 0.0–0.5)
Eosinophils Relative: 3 %
HCT: 34.9 % — ABNORMAL LOW (ref 39.0–52.0)
Hemoglobin: 12.2 g/dL — ABNORMAL LOW (ref 13.0–17.0)
Immature Granulocytes: 0 %
Lymphocytes Relative: 28 %
Lymphs Abs: 0.6 10*3/uL — ABNORMAL LOW (ref 0.7–4.0)
MCH: 32.9 pg (ref 26.0–34.0)
MCHC: 35 g/dL (ref 30.0–36.0)
MCV: 94.1 fL (ref 80.0–100.0)
Monocytes Absolute: 0.3 10*3/uL (ref 0.1–1.0)
Monocytes Relative: 13 %
Neutro Abs: 1.2 10*3/uL — ABNORMAL LOW (ref 1.7–7.7)
Neutrophils Relative %: 54 %
Platelets: 49 10*3/uL — ABNORMAL LOW (ref 150–400)
RBC: 3.71 MIL/uL — ABNORMAL LOW (ref 4.22–5.81)
RDW: 15.6 % — ABNORMAL HIGH (ref 11.5–15.5)
WBC: 2.1 10*3/uL — ABNORMAL LOW (ref 4.0–10.5)
nRBC: 0 % (ref 0.0–0.2)

## 2023-06-16 LAB — LACTIC ACID, PLASMA: Lactic Acid, Venous: 2.2 mmol/L (ref 0.5–1.9)

## 2023-06-16 MED ORDER — SODIUM CHLORIDE 0.9 % IV BOLUS
1000.0000 mL | Freq: Once | INTRAVENOUS | Status: AC
  Administered 2023-06-16: 1000 mL via INTRAVENOUS

## 2023-06-16 NOTE — ED Triage Notes (Signed)
Pt arrives via POV BIB Daughter Joshua Berry from [facility] d/t recommendation of PCP at the [place]. Sts having low PLTs and WBC. Daughter sts concern for increased fatigue, intermittent slow to respond, and frequent urination over the past week. No cough/fevers. Hx thrombocytopenia and iron deficiency anemia. Did not received his last monthly infusion. Pt is A&Ox4.

## 2023-06-16 NOTE — ED Triage Notes (Signed)
Daughter sts PLTs were 40, unknown CBC level.

## 2023-06-16 NOTE — ED Notes (Signed)
Pt was unable to provide urine sample

## 2023-06-16 NOTE — ED Provider Notes (Signed)
[facility] Provider Note    Event Date/Time   First MD Initiated Contact with Patient 06/16/23 2302     (approximate)  History   Chief Complaint: Abnormal Labs   HPI  Joshua Berry is a 77 y.o. male with a past medical history of cirrhosis, diabetes, hypertension, thrombocytopenia, presents to the emergency department for concerns of fatigue and abnormal lab work.  According to the patient and daughter patient has a history of both low white blood cell count as well as thrombocytopenia.  Daughter states for the past week or so the patient has been acting more fatigued, physician was concerned so they did blood work showing white blood cell count of 2.1 and platelet count of 48 so they sent the patient to the emergency department for evaluation.  Here in the emergency department the patient appears well, no distress.  States he does receive iron transfusions for anemia but states last time they said he did not need one because his iron levels were normal.  Patient denies any recent illnesses such as fever vomiting cough congestion.  States chronic diarrhea due to lactulose use.  No urinary symptoms.  Physical Exam   Triage Vital Signs: ED Triage Vitals  Enc Vitals Group     BP 06/16/23 2205 (!) 116/59     Pulse Rate 06/16/23 2205 77     Resp 06/16/23 2205 20     Temp 06/16/23 2205 98.2 F (36.8 C)     Temp Source 06/16/23 2205 Oral     SpO2 06/16/23 2205 98 %     Weight 06/16/23 2206 224 lb (101.6 kg)     Height 06/16/23 2206 6\' 2"  (1.88 m)     Head Circumference --      Peak Flow --      Pain Score 06/16/23 2206 0     Pain Loc --      Pain Edu? --      Excl. in GC? --     Most recent vital signs: Vitals:   06/16/23 2205  BP: (!) 116/59  Pulse: 77  Resp: 20  Temp: 98.2 F (36.8 C)  SpO2: 98%    General: Awake, no distress.  CV:  Good peripheral perfusion.  Regular rate and rhythm  Resp:  Normal effort.  Equal breath sounds bilaterally.   Abd:  No distention.  Soft, nontender.  No rebound or guarding.  ED Results / Procedures / Treatments   MEDICATIONS ORDERED IN ED: Medications  sodium chloride 0.9 % bolus 1,000 mL (has no administration in time range)     IMPRESSION / MDM / ASSESSMENT AND PLAN / ED COURSE  I reviewed the triage vital signs and the nursing notes.  Patient's presentation is most consistent with acute presentation with potential threat to life or bodily function.  Patient presents emergency department for 1 week of generalized fatigue as well as abnormal lab work.  Patient CBC does show a white blood cell count of 2.1 and a platelet count of 49.  I reviewed the patient's past records he has had low white blood counts as low as this level as well as platelet counts much lower than 49 in the past.  These appear to be on the lower end but within his baseline range.  Patient's chemistry shows no significant findings.  Ammonia level is pending.  Patient's lactic acid is elevated to 2.2, but no infectious symptoms on review of systems or history.  We will IV  hydrate and recheck the lactic acid.  Will obtain an ammonia level as well as a urinalysis.  We will continue to closely monitor in the emergency department.  Patient's labs have resulted showing ammonia of 127.  Highly suspect the hyperammonemia to be causing patient's fatigue and weakness.  We will dose oral lactulose and admit to the hospital service for further workup and treatment.  Lactic is reassuringly downtrending.  [facility].  We discussed with the [facility] we finally heard back and they state that the patient could be admitted locally.  Will admit to our hospital.  Patient agreeable to plan.  FINAL CLINICAL IMPRESSION(S) / ED DIAGNOSES   Fatigue Chronic thrombocytopenia Chronic leukocytosis  Note:  This document was prepared using Dragon voice recognition software and may include unintentional dictation errors.   Minna Antis, MD 06/17/23 [phone]

## 2023-06-17 DIAGNOSIS — Z952 Presence of prosthetic heart valve: Secondary | ICD-10-CM | POA: Diagnosis not present

## 2023-06-17 DIAGNOSIS — I10 Essential (primary) hypertension: Secondary | ICD-10-CM | POA: Diagnosis present

## 2023-06-17 DIAGNOSIS — E785 Hyperlipidemia, unspecified: Secondary | ICD-10-CM

## 2023-06-17 DIAGNOSIS — K7460 Unspecified cirrhosis of liver: Secondary | ICD-10-CM | POA: Diagnosis present

## 2023-06-17 DIAGNOSIS — G4733 Obstructive sleep apnea (adult) (pediatric): Secondary | ICD-10-CM | POA: Diagnosis present

## 2023-06-17 DIAGNOSIS — E119 Type 2 diabetes mellitus without complications: Secondary | ICD-10-CM

## 2023-06-17 DIAGNOSIS — Z87891 Personal history of nicotine dependence: Secondary | ICD-10-CM | POA: Diagnosis not present

## 2023-06-17 DIAGNOSIS — L89152 Pressure ulcer of sacral region, stage 2: Secondary | ICD-10-CM | POA: Diagnosis present

## 2023-06-17 DIAGNOSIS — D72819 Decreased white blood cell count, unspecified: Secondary | ICD-10-CM | POA: Diagnosis present

## 2023-06-17 DIAGNOSIS — K529 Noninfective gastroenteritis and colitis, unspecified: Secondary | ICD-10-CM | POA: Diagnosis present

## 2023-06-17 DIAGNOSIS — Z8249 Family history of ischemic heart disease and other diseases of the circulatory system: Secondary | ICD-10-CM | POA: Diagnosis not present

## 2023-06-17 DIAGNOSIS — Z66 Do not resuscitate: Secondary | ICD-10-CM | POA: Diagnosis present

## 2023-06-17 DIAGNOSIS — D61818 Other pancytopenia: Secondary | ICD-10-CM | POA: Diagnosis present

## 2023-06-17 DIAGNOSIS — L8990 Pressure ulcer of unspecified site, unspecified stage: Secondary | ICD-10-CM

## 2023-06-17 DIAGNOSIS — K219 Gastro-esophageal reflux disease without esophagitis: Secondary | ICD-10-CM | POA: Diagnosis present

## 2023-06-17 DIAGNOSIS — K7682 Hepatic encephalopathy: Secondary | ICD-10-CM | POA: Diagnosis present

## 2023-06-17 DIAGNOSIS — E871 Hypo-osmolality and hyponatremia: Secondary | ICD-10-CM | POA: Diagnosis present

## 2023-06-17 DIAGNOSIS — I440 Atrioventricular block, first degree: Secondary | ICD-10-CM | POA: Diagnosis present

## 2023-06-17 DIAGNOSIS — R5383 Other fatigue: Secondary | ICD-10-CM | POA: Diagnosis present

## 2023-06-17 DIAGNOSIS — Z79890 Hormone replacement therapy (postmenopausal): Secondary | ICD-10-CM | POA: Diagnosis not present

## 2023-06-17 DIAGNOSIS — Z794 Long term (current) use of insulin: Secondary | ICD-10-CM

## 2023-06-17 DIAGNOSIS — M1990 Unspecified osteoarthritis, unspecified site: Secondary | ICD-10-CM | POA: Diagnosis present

## 2023-06-17 DIAGNOSIS — E039 Hypothyroidism, unspecified: Secondary | ICD-10-CM | POA: Diagnosis present

## 2023-06-17 DIAGNOSIS — K7581 Nonalcoholic steatohepatitis (NASH): Secondary | ICD-10-CM | POA: Diagnosis present

## 2023-06-17 DIAGNOSIS — Z85820 Personal history of malignant melanoma of skin: Secondary | ICD-10-CM | POA: Diagnosis not present

## 2023-06-17 DIAGNOSIS — I444 Left anterior fascicular block: Secondary | ICD-10-CM | POA: Diagnosis present

## 2023-06-17 LAB — GLUCOSE, CAPILLARY
Glucose-Capillary: 120 mg/dL — ABNORMAL HIGH (ref 70–99)
Glucose-Capillary: 98 mg/dL (ref 70–99)
Glucose-Capillary: 181 mg/dL — ABNORMAL HIGH (ref 70–99)
Glucose-Capillary: 149 mg/dL — ABNORMAL HIGH (ref 70–99)

## 2023-06-17 LAB — URINALYSIS, ROUTINE W REFLEX MICROSCOPIC
Bilirubin Urine: NEGATIVE
Glucose, UA: NEGATIVE mg/dL
Hgb urine dipstick: NEGATIVE
Ketones, ur: NEGATIVE mg/dL
Leukocytes,Ua: NEGATIVE
Nitrite: NEGATIVE
Protein, ur: NEGATIVE mg/dL
Specific Gravity, Urine: 1.013 (ref 1.005–1.030)
pH: 6 (ref 5.0–8.0)

## 2023-06-17 LAB — LACTIC ACID, PLASMA: Lactic Acid, Venous: 1.5 mmol/L (ref 0.5–1.9)

## 2023-06-17 LAB — MRSA NEXT GEN BY PCR, NASAL: MRSA by PCR Next Gen: NOT DETECTED

## 2023-06-17 LAB — AMMONIA: Ammonia: 127 umol/L — ABNORMAL HIGH (ref 9–35)

## 2023-06-17 MED ORDER — ACETAMINOPHEN 650 MG RE SUPP: 650.0000 mg | Freq: Four times a day (QID) | RECTAL | Status: DC | PRN

## 2023-06-17 MED ORDER — FOLIC ACID 1 MG PO TABS
1.0000 mg | ORAL_TABLET | Freq: Every day | ORAL | Status: DC
  Administered 2023-06-17 – 2023-06-18 (×2): 1 mg via ORAL
  Filled 2023-06-17 (×2): qty 1

## 2023-06-17 MED ORDER — BOOST / RESOURCE BREEZE PO LIQD CUSTOM
1.0000 | Freq: Three times a day (TID) | ORAL | Status: DC
  Administered 2023-06-17 – 2023-06-18 (×5): 1 via ORAL

## 2023-06-17 MED ORDER — FUROSEMIDE 20 MG PO TABS: 20.0000 mg | ORAL_TABLET | Freq: Every day | ORAL | Status: DC

## 2023-06-17 MED ORDER — VITAMIN C 500 MG PO TABS
1000.0000 mg | ORAL_TABLET | Freq: Every day | ORAL | Status: DC
  Administered 2023-06-17 – 2023-06-18 (×2): 1000 mg via ORAL
  Filled 2023-06-17 (×2): qty 2

## 2023-06-17 MED ORDER — HYDROCORTISONE 1 % EX CREA
TOPICAL_CREAM | Freq: Four times a day (QID) | CUTANEOUS | Status: DC
  Filled 2023-06-17: qty 28

## 2023-06-17 MED ORDER — PANTOPRAZOLE SODIUM 40 MG PO TBEC
40.0000 mg | DELAYED_RELEASE_TABLET | Freq: Two times a day (BID) | ORAL | Status: DC
  Administered 2023-06-17 – 2023-06-18 (×3): 40 mg via ORAL
  Filled 2023-06-17 (×3): qty 1

## 2023-06-17 MED ORDER — KETOTIFEN FUMARATE 0.035 % OP SOLN
1.0000 [drp] | Freq: Two times a day (BID) | OPHTHALMIC | Status: DC
  Administered 2023-06-17 – 2023-06-18 (×3): 1 [drp] via OPHTHALMIC
  Filled 2023-06-17: qty 5

## 2023-06-17 MED ORDER — MELATONIN 5 MG PO TABS
10.0000 mg | ORAL_TABLET | Freq: Every day | ORAL | Status: DC
  Administered 2023-06-17: 10 mg via ORAL
  Filled 2023-06-17: qty 2

## 2023-06-17 MED ORDER — INSULIN ASPART 100 UNIT/ML IJ SOLN
0.0000 [IU] | Freq: Every day | INTRAMUSCULAR | Status: DC
  Filled 2023-06-17: qty 1

## 2023-06-17 MED ORDER — SALINE SPRAY 0.65 % NA SOLN
1.0000 | NASAL | Status: DC | PRN
  Administered 2023-06-17: 1 via NASAL
  Filled 2023-06-17: qty 44

## 2023-06-17 MED ORDER — ACETAMINOPHEN 325 MG PO TABS: 650.0000 mg | ORAL_TABLET | Freq: Four times a day (QID) | ORAL | Status: DC | PRN

## 2023-06-17 MED ORDER — MAGNESIUM OXIDE -MG SUPPLEMENT 400 (240 MG) MG PO TABS
800.0000 mg | ORAL_TABLET | Freq: Two times a day (BID) | ORAL | Status: DC
  Administered 2023-06-17 – 2023-06-18 (×3): 800 mg via ORAL
  Filled 2023-06-17 (×3): qty 2

## 2023-06-17 MED ORDER — LIDOCAINE 5 % EX PTCH
2.0000 | MEDICATED_PATCH | CUTANEOUS | Status: DC
  Administered 2023-06-17 – 2023-06-18 (×2): 2 via TRANSDERMAL
  Filled 2023-06-17 (×2): qty 2

## 2023-06-17 MED ORDER — ADULT MULTIVITAMIN W/MINERALS CH
1.0000 | ORAL_TABLET | Freq: Every day | ORAL | Status: DC
  Administered 2023-06-17 – 2023-06-18 (×2): 1 via ORAL
  Filled 2023-06-17 (×2): qty 1

## 2023-06-17 MED ORDER — LACTULOSE 10 GM/15ML PO SOLN
30.0000 g | Freq: Once | ORAL | Status: AC
  Administered 2023-06-17: 30 g via ORAL
  Filled 2023-06-17: qty 60

## 2023-06-17 MED ORDER — RISAQUAD PO CAPS
1.0000 | ORAL_CAPSULE | Freq: Every day | ORAL | Status: DC
  Administered 2023-06-17 – 2023-06-18 (×2): 1 via ORAL
  Filled 2023-06-17 (×2): qty 1

## 2023-06-17 MED ORDER — TERAZOSIN HCL 5 MG PO CAPS
5.0000 mg | ORAL_CAPSULE | Freq: Every day | ORAL | Status: DC
  Administered 2023-06-17: 5 mg via ORAL
  Filled 2023-06-17: qty 1

## 2023-06-17 MED ORDER — TRIAMCINOLONE ACETONIDE 0.5 % EX CREA: TOPICAL_CREAM | Freq: Two times a day (BID) | CUTANEOUS | Status: DC | PRN

## 2023-06-17 MED ORDER — LEVOTHYROXINE SODIUM 100 MCG PO TABS
100.0000 ug | ORAL_TABLET | Freq: Every day | ORAL | Status: DC
  Administered 2023-06-17 – 2023-06-18 (×2): 100 ug via ORAL
  Filled 2023-06-17: qty 2
  Filled 2023-06-17: qty 1

## 2023-06-17 MED ORDER — FAMOTIDINE 20 MG PO TABS
20.0000 mg | ORAL_TABLET | Freq: Every day | ORAL | Status: DC
  Administered 2023-06-17 – 2023-06-18 (×2): 20 mg via ORAL
  Filled 2023-06-17 (×2): qty 1

## 2023-06-17 MED ORDER — ATORVASTATIN CALCIUM 20 MG PO TABS
20.0000 mg | ORAL_TABLET | Freq: Every day | ORAL | Status: DC
  Administered 2023-06-17: 20 mg via ORAL
  Filled 2023-06-17: qty 1

## 2023-06-17 MED ORDER — MAGNESIUM HYDROXIDE 400 MG/5ML PO SUSP: 30.0000 mL | Freq: Every day | ORAL | Status: DC | PRN

## 2023-06-17 MED ORDER — SODIUM CHLORIDE 0.9 % IV SOLN: INTRAVENOUS | Status: DC

## 2023-06-17 MED ORDER — INSULIN GLARGINE-YFGN 100 UNIT/ML ~~LOC~~ SOLN
14.0000 [IU] | Freq: Every day | SUBCUTANEOUS | Status: DC
  Administered 2023-06-17 – 2023-06-18 (×2): 14 [IU] via SUBCUTANEOUS
  Filled 2023-06-17 (×3): qty 0.14

## 2023-06-17 MED ORDER — OMEGA-3-ACID ETHYL ESTERS 1 G PO CAPS
1.0000 g | ORAL_CAPSULE | Freq: Two times a day (BID) | ORAL | Status: DC
  Administered 2023-06-17 – 2023-06-18 (×3): 1 g via ORAL
  Filled 2023-06-17 (×4): qty 1

## 2023-06-17 MED ORDER — LACTULOSE 10 GM/15ML PO SOLN
30.0000 g | Freq: Three times a day (TID) | ORAL | Status: DC
  Administered 2023-06-17 – 2023-06-18 (×4): 30 g via ORAL
  Filled 2023-06-17 (×4): qty 60

## 2023-06-17 MED ORDER — ONDANSETRON HCL 4 MG/2ML IJ SOLN: 4.0000 mg | Freq: Four times a day (QID) | INTRAMUSCULAR | Status: DC | PRN

## 2023-06-17 MED ORDER — TRAZODONE HCL 50 MG PO TABS
25.0000 mg | ORAL_TABLET | Freq: Every evening | ORAL | Status: DC | PRN
  Administered 2023-06-17: 25 mg via ORAL
  Filled 2023-06-17: qty 1

## 2023-06-17 MED ORDER — INSULIN ASPART 100 UNIT/ML IJ SOLN: 16.0000 [IU] | Freq: Three times a day (TID) | INTRAMUSCULAR | Status: DC

## 2023-06-17 MED ORDER — RIFAXIMIN 550 MG PO TABS
550.0000 mg | ORAL_TABLET | Freq: Two times a day (BID) | ORAL | Status: DC
  Administered 2023-06-17 – 2023-06-18 (×3): 550 mg via ORAL
  Filled 2023-06-17 (×3): qty 1

## 2023-06-17 MED ORDER — INSULIN ASPART 100 UNIT/ML IJ SOLN
0.0000 [IU] | Freq: Three times a day (TID) | INTRAMUSCULAR | Status: DC
  Administered 2023-06-17 – 2023-06-18 (×2): 4 [IU] via SUBCUTANEOUS
  Administered 2023-06-18: 3 [IU] via SUBCUTANEOUS
  Filled 2023-06-17 (×3): qty 1

## 2023-06-17 MED ORDER — ONDANSETRON HCL 4 MG PO TABS: 4.0000 mg | ORAL_TABLET | Freq: Four times a day (QID) | ORAL | Status: DC | PRN

## 2023-06-17 MED ORDER — VITAMIN D 25 MCG (1000 UNIT) PO TABS
2000.0000 [IU] | ORAL_TABLET | Freq: Every day | ORAL | Status: DC
  Administered 2023-06-17 – 2023-06-18 (×2): 2000 [IU] via ORAL
  Filled 2023-06-17 (×2): qty 2

## 2023-06-17 NOTE — Assessment & Plan Note (Signed)
-   The patient will be placed on supplemental coverage with NovoLog. - We will continue his basal coverage. 

## 2023-06-17 NOTE — H&P (Addendum)
Protection   PATIENT NAME: Joshua Berry    MR#:  [id number]  DATE OF BIRTH:  1946/10/17  DATE OF ADMISSION:  06/16/2023  PRIMARY CARE PHYSICIAN: [facility]   Patient is coming from: Home  REQUESTING/REFERRING PHYSICIAN: Minna Antis, MD  CHIEF COMPLAINT:   Chief Complaint  Patient presents with   Abnormal Labs     HISTORY OF PRESENT ILLNESS:  Joshua Berry is a 77 y.o. Caucasian male with medical history significant for type II DM, hypertension, thrombocytopenia and liver cirrhosis with NASH, OSA, who presented to the emergency room with acute onset of generalized fatigue and abnormal blood work.  Per his daughter the patient has been more fatigued over the past week and his PCP was concerned about leukopenia of 2.1 and thrombocytopenia of 48.  The patient denies any bleeding diathesis.  He receives iron transfusion for anemia.  No fever or chills.  No nausea or vomiting or abdominal pain.  No headache or dizziness or blurred vision.  He admits to mild somnolence and confusion.  No dysuria, oliguria or hematuria or flank pain.  He has chronic diarrhea with lactulose intake.  No cough or wheezing or hemoptysis.  ED Course: When he came to the ER, BP was 116/59 with otherwise normal vital signs.  Labs revealed hyponatremia of 134 and CO2 of 20 with glucose of 126 albumin 3.2 and AST 52.  Ammonia level came back significantly elevated at 127 and total bili was 1.7.  Lactic acid was 2.2 and later 1.5 and CBC showed WBC of 2.1 with neutropenia and anemia.  UA was unremarkable. EKG as reviewed by me : EKG showed normal sinus rhythm with a rate of 63 and PACs, prolonged PR interval and left anterior fascicular block. Imaging: Portable chest x-ray showed no acute cardiopulmonary disease.  The patient was given p.o. lactulose and 1 L bolus of IV bolus of IV normal saline.  He will be admitted to a medical telemetry bed for further evaluation and management. PAST MEDICAL  HISTORY:   Past Medical History:  Diagnosis Date   Arthritis    Bleeding ulcer    Cancer (HCC)    melanoma, carcinoma   Cirrhosis of liver (HCC)    Diabetes mellitus without complication (HCC)    Heart murmur    Hypertension    Iron deficiency anemia    NASH (nonalcoholic steatohepatitis)    Sleep apnea    Spleen enlarged    Thrombocytopenia (HCC)     PAST SURGICAL HISTORY:   Past Surgical History:  Procedure Laterality Date   CHOLECYSTECTOMY     COLONOSCOPY N/A 07/23/2019   Procedure: COLONOSCOPY;  Surgeon: Toney Reil, MD;  Location: [facility] ENDOSCOPY;  Service: Gastroenterology;  Laterality: N/A;   ESOPHAGOGASTRODUODENOSCOPY N/A 07/20/2019   Procedure: ESOPHAGOGASTRODUODENOSCOPY (EGD);  Surgeon: Toney Reil, MD;  Location: [facility] ENDOSCOPY;  Service: Gastroenterology;  Laterality: N/A;   ESOPHAGOGASTRODUODENOSCOPY Left 08/20/2019   Procedure: ESOPHAGOGASTRODUODENOSCOPY (EGD);  Surgeon: Pasty Spillers, MD;  Location: [facility] ENDOSCOPY;  Service: Endoscopy;  Laterality: Left;   ESOPHAGOGASTRODUODENOSCOPY (EGD) WITH PROPOFOL N/A 04/25/2021   Procedure: ESOPHAGOGASTRODUODENOSCOPY (EGD) WITH PROPOFOL;  Surgeon: Toney Reil, MD;  Location: [facility] ENDOSCOPY;  Service: Gastroenterology;  Laterality: N/A;   MECHANICAL AORTIC VALVE REPLACEMENT     SKIN BIOPSY     TEE WITHOUT CARDIOVERSION N/A 12/23/2018   Procedure: TRANSESOPHAGEAL ECHOCARDIOGRAM (TEE);  Surgeon: Dalia Heading, MD;  Location: [facility] ORS;  Service: Cardiovascular;  Laterality:  N/A;   TEE WITHOUT CARDIOVERSION N/A 05/21/2022   Procedure: TRANSESOPHAGEAL ECHOCARDIOGRAM (TEE);  Surgeon: Antonieta Iba, MD;  Location: [facility] ORS;  Service: Cardiovascular;  Laterality: N/A;   TEE WITHOUT CARDIOVERSION N/A 04/09/2023   Procedure: TRANSESOPHAGEAL ECHOCARDIOGRAM;  Surgeon: Debbe Odea, MD;  Location: [facility] ORS;  Service: Cardiovascular;  Laterality: N/A;    SOCIAL HISTORY:   Social History    Tobacco Use   Smoking status: Former    Packs/day: 0.50    Years: 5.00    Additional pack years: 0.00    Total pack years: 2.50    Types: Cigarettes    Quit date: 10/16/1968    Years since quitting: 54.7   Smokeless tobacco: Never  Substance Use Topics   Alcohol use: Not Currently    FAMILY HISTORY:   Family History  Problem Relation Age of Onset   Heart attack Brother     DRUG ALLERGIES:   Allergies  Allergen Reactions   Nsaids     Other Reaction(s): Ascites   Camphor Rash and Swelling   Nickel Rash   Sulfur Rash and Swelling   Sulfamethoxazole Swelling   Latex Rash   Lisinopril Hives    Other Reaction(s): Cough    REVIEW OF SYSTEMS:   ROS As per history of present illness. All pertinent systems were reviewed above. Constitutional, HEENT, cardiovascular, respiratory, GI, GU, musculoskeletal, neuro, psychiatric, endocrine, integumentary and hematologic systems were reviewed and are otherwise negative/unremarkable except for positive findings mentioned above in the HPI.   MEDICATIONS AT HOME:   Prior to Admission medications   Medication Sig Start Date End Date Taking? Authorizing Provider  ASPERCREME LIDOCAINE 4 % LIQD Apply 1 Application topically daily as needed. 06/07/22   [provider]  atorvastatin (LIPITOR) 20 MG tablet Take 20 mg by mouth at bedtime.    [provider]  augmented betamethasone dipropionate (DIPROLENE-AF) 0.05 % cream Apply 1 application topically 2 (two) times daily.    [provider]  Cholecalciferol 50 MCG (2000 UT) TABS Take 1 tablet by mouth daily.    [provider]  famotidine (PEPCID) 20 MG tablet Take 20 mg by mouth daily.    [provider]  folic acid (FOLVITE) 1 MG tablet Take 1 tablet by mouth daily. 12/03/19   [provider]  furosemide (LASIX) 20 MG tablet Take 1 tablet (20 mg total) by mouth daily. 04/07/22   Arnetha Courser, MD  insulin aspart (NOVOLOG) 100 UNIT/ML  injection Inject 10 Units into the skin 3 (three) times daily before meals. Patient taking differently: Inject 16-22 Units into the skin 3 (three) times daily before meals. (Based on blood glucose reading) 04/26/21   Rhetta Mura, MD  insulin glargine (LANTUS) 100 UNIT/ML injection Inject 0.14 mLs (14 Units total) into the skin daily. 08/05/22   Hollice Espy, MD  ketotifen (ZADITOR) 0.025 % ophthalmic solution Place 1 drop into both eyes 2 (two) times daily.    [provider]  Lactobacillus Rhamnosus, GG, (RA PROBIOTIC DIGESTIVE CARE) CAPS Take 1 capsule by mouth daily.    [provider]  lactulose (CHRONULAC) 10 GM/15ML solution Take 45 mLs (30 g total) by mouth 2 (two) times daily. 08/05/22   Hollice Espy, MD  levothyroxine (SYNTHROID) 100 MCG tablet Take 100 mcg by mouth daily before breakfast.    [provider]  lidocaine (LIDODERM) 5 % Place 2 patches onto the skin daily. Remove & Discard patch within 12 hours or as directed by  MD 05/28/22   [name], MD  magnesium oxide (MAG-OX) 400 MG tablet Take 2 tablets (800 mg total) by mouth 2 (two) times daily. 05/28/22   [name], MD  Melatonin 10 MG TABS Take 10 mg by mouth at bedtime.    [provider]  Omega-3 Fatty Acids (FISH OIL) 1000 MG CAPS Take 1 capsule by mouth 2 (two) times daily.    [provider]  pantoprazole (PROTONIX) 40 MG tablet Take 1 tablet (40 mg total) by mouth 2 (two) times daily. 04/07/22 04/05/23  Arnetha Courser, MD  rifaximin (XIFAXAN) 550 MG TABS tablet Take 550 mg by mouth 2 (two) times daily. Takes with lactulose    [provider]  Semaglutide,0.25 or 0.5MG /DOS, 2 MG/1.5ML SOPN Inject 0.5 mg into the skin once a week.    [provider]  terazosin (HYTRIN) 5 MG capsule Take 5 mg by mouth at bedtime.    [provider]  trolamine salicylate (ASPERCREME) 10 % cream Apply topically as needed for muscle pain. 05/28/22   [name], MD   vitamin C (ASCORBIC ACID) 500 MG tablet Take 1,000 mg by mouth daily.    [provider]      VITAL SIGNS:  Blood pressure (!) 148/70, pulse 64, temperature 97.6 F (36.4 C), resp. rate 18, height 6\' 2"  (1.88 m), weight 101.6 kg, SpO2 99 %.  PHYSICAL EXAMINATION:  Physical Exam  GENERAL:  77 y.o.-year-old Caucasian male patient lying in the bed with no acute distress.  EYES: Pupils equal, round, reactive to light and accommodation. No scleral icterus. Extraocular muscles intact.  HEENT: Head atraumatic, normocephalic. Oropharynx and nasopharynx clear.  NECK:  Supple, no jugular venous distention. No thyroid enlargement, no tenderness.  LUNGS: Normal breath sounds bilaterally, no wheezing, rales,rhonchi or crepitation. No use of accessory muscles of respiration.  CARDIOVASCULAR: Regular rate and rhythm, S1, S2 normal. No murmurs, rubs, or gallops.  ABDOMEN: Soft, nondistended, nontender. Bowel sounds present. No organomegaly or mass.  EXTREMITIES: No pedal edema, cyanosis, or clubbing.  NEUROLOGIC: Cranial nerves II through XII are intact. Muscle strength 5/5 in all extremities. Sensation intact. Gait not checked.  PSYCHIATRIC: The patient is alert and oriented x 3.  Normal affect and good eye contact. SKIN: No obvious rash, lesion, or ulcer.   LABORATORY PANEL:   CBC Recent Labs  Lab 06/16/23 2216  WBC 2.1*  HGB 12.2*  HCT 34.9*  PLT 49*   ------------------------------------------------------------------------------------------------------------------  Chemistries  Recent Labs  Lab 06/16/23 2216  NA 134*  K 3.8  CL 104  CO2 20*  GLUCOSE 126*  BUN 12  CREATININE 0.96  CALCIUM 9.2  AST 52*  ALT 33  ALKPHOS 113  BILITOT 1.7*   ------------------------------------------------------------------------------------------------------------------  Cardiac Enzymes No results for input(s): "TROPONINI" in the last 168  hours. ------------------------------------------------------------------------------------------------------------------  RADIOLOGY:  No results found.    IMPRESSION AND PLAN:  Assessment and Plan: Hepatic encephalopathy (HCC) - The patient will be admitted to a medical telemetry bed. - We will continue p.o. lactulose 45 mL p.o. 3 times daily. - We will follow mental status. - We will follow ammonia level. - We will continue rifaximin.  Pancytopenia (HCC) - We will follow CBC.  Hypothyroidism - We will continue Synthroid.  Dyslipidemia - We will continue statin therapy and Lovaza.  GERD without esophagitis - We will continue PPI therapy and H2 blocker.  Type 2 diabetes mellitus without complications (HCC) - The patient will be placed on supplemental coverage with  NovoLog. - We will continue his basal coverage.       DVT prophylaxis: SCDs.  Medical prophylaxis contraindicated due to thrombocytopenia. Advanced Care Planning:  Code Status: The patient is DNR and DNI.  This was discussed with him. Family Communication:  The plan of care was discussed in details with the patient (and family). I answered all questions. The patient agreed to proceed with the above mentioned plan. Further management will depend upon hospital course. Disposition Plan: Back to previous home environment Consults called: none. All the records are reviewed and case discussed with ED provider.  Status is: Inpatient   At the time of the admission, it appears that the appropriate admission status for this patient is inpatient.  This is judged to be reasonable and necessary in order to provide the required intensity of service to ensure the patient's safety given the presenting symptoms, physical exam findings and initial radiographic and laboratory data in the context of comorbid conditions.  The patient requires inpatient status due to high intensity of service, high risk of further deterioration  and high frequency of surveillance required.  I certify that at the time of admission, it is my clinical judgment that the patient will require inpatient hospital care extending more than 2 midnights.                            Dispo: The patient is from: Home              Anticipated d/c is to: Home              Patient currently is not medically stable to d/c.              Difficult to place patient: No  Hannah Beat M.D on 06/17/2023 at 6:18 AM  [facility]   From 7 PM-7 AM, contact night-coverage [email or web address]  CC: Primary care physician; [facility]

## 2023-06-17 NOTE — Progress Notes (Signed)
Initial Nutrition Assessment  DOCUMENTATION CODES:   Not applicable  INTERVENTION:   -Boost Breeze po TID, each supplement provides 250 kcal and 9 grams of protein  -MVI with minerals daily -Downgrade diet to dysphagia 3 for ease of intake  NUTRITION DIAGNOSIS:   Increased nutrient needs related to chronic illness (cirrhosis) as evidenced by estimated needs.  GOAL:   Patient will meet greater than or equal to 90% of their needs  MONITOR:   PO intake, Supplement acceptance  REASON FOR ASSESSMENT:   Malnutrition Screening Tool    ASSESSMENT:   Pt with medical history significant for type II DM, hypertension, thrombocytopenia and liver cirrhosis with NASH, OSA, who presented with acute onset of generalized fatigue and abnormal blood work.  Pt admitted with hepatic encephalopathy.   Reviewed I/O's: -700 ml x 24 hours  UOP: 700 ml x 24 hours  Case discussed with RN. Pt is from [facility] and is alert and oriented. He does not like the taste of Ensure supplements.   Spoke with pt at bedside, who was pleasant and in good spirits today. He reports feeling better. He consumed 100% of his omelette for breakfast this morning (documented po 100%).   Pt shares that he has experienced a general decline in health over the past 8 months. He explains that he had a prolonged hospitalization in October of 2023, which caused his to lose about 40#. He reports his UBW is around 264# and now he has been weighing steadily at 224# over the past few months. He shares with this RD that his teeth started breaking off in his mouth and has had a difficult time chewing foods due to texture. Pt mainly eats soft foods such as grits, ice cream, and puddings. He does not eat a lot of meat.   Discussed importance of good meal and supplement intake to promote healing. Pt is agreeable to a mechanically altered diet. Pt also amenable to [facility] and has had this in the past.   Pt shares he has a  dental appointment in 2 weeks where he can investigate the possibility of getting dentures.   Reviewed wt hx; pt has experienced a 2.7% wt loss over the past 2 months, which is not significant for time frame.   Medications reviewed and include vitamin C, vitamin D3, folic acid, lactulose,and 0.9% sodium chloride infusion @ 75 ml/hr.   Lab Results  Component Value Date   HGBA1C 6.0 (H) 04/07/2022   PTA DM medications are 10 units insulin aspart TID, 18 units insulin glargine daily, and 0.5 mg semaglutide daily.   Labs reviewed: Na: 134, CBGS: 98 (inpatient orders for glycemic control are 0-20 units insulin aspart TID with meals and  0-5 units insulin aspart daily at bedtime).    NUTRITION - FOCUSED PHYSICAL EXAM:  Flowsheet Row Most Recent Value  Orbital Region Moderate depletion  Upper Arm Region No depletion  Thoracic and Lumbar Region No depletion  Buccal Region No depletion  Temple Region Moderate depletion  Clavicle Bone Region Mild depletion  Clavicle and Acromion Bone Region Mild depletion  Scapular Bone Region Mild depletion  Dorsal Hand No depletion  Patellar Region No depletion  Anterior Thigh Region No depletion  Posterior Calf Region No depletion  Edema (RD Assessment) Mild  Hair Reviewed  Eyes Reviewed  Mouth Reviewed  Skin Reviewed  Nails Reviewed       Diet Order:   Diet Order  DIET DYS 3 Fluid consistency: Thin  Diet effective now                   EDUCATION NEEDS:   Education needs have been addressed  Skin:  Skin Assessment: Skin Integrity Issues: Skin Integrity Issues:: Stage I Stage I: coccyx  Last BM:  06/17/23 (type 5)  Height:   Ht Readings from Last 1 Encounters:  06/16/23 6\' 2"  (1.88 m)    Weight:   Wt Readings from Last 1 Encounters:  06/16/23 101.6 kg    Ideal Body Weight:  86.4 kg  BMI:  Body mass index is 28.76 kg/m.  Estimated Nutritional Needs:   Kcal:  2150-2350  Protein:  110-125 grams  Fluid:   > 2 L    Levada Schilling, RD, LDN, CDCES Registered Dietitian II Certified Diabetes Care and Education Specialist Please refer to [facility] for RD and/or RD on-call/weekend/after hours pager

## 2023-06-17 NOTE — Assessment & Plan Note (Signed)
-   We will continue Synthroid. 

## 2023-06-17 NOTE — Assessment & Plan Note (Addendum)
-   The patient will be admitted to a medical telemetry bed. - We will continue p.o. lactulose 45 mL p.o. 3 times daily. - We will follow mental status. - We will follow ammonia level. - We will continue rifaximin.

## 2023-06-17 NOTE — Assessment & Plan Note (Signed)
We will follow CBC. 

## 2023-06-17 NOTE — Progress Notes (Signed)
Per Dr Sreenath, dc tele monitoring  

## 2023-06-17 NOTE — Progress Notes (Signed)
Brief hospitalist update note.  This is a nonbillable note.  Please see same-day H&P for full billable details.  Briefly, this is a 77 year old male with history of known [name] cirrhosis who presents for evaluation of progressive fatigue and weakness.  Ammonia found to be markedly elevated at greater than 120.  Patient is on lactulose at home.  Dose has been increased to 45 g 3 times daily.  Titrate to 3-4 soft bowel movements daily.  Mentation was reasonable on my evaluation on 7/2 patient is alert, oriented.  His only complaint is weakness and fatigue.  Lolita Patella MD  No charge

## 2023-06-17 NOTE — Assessment & Plan Note (Addendum)
-   We will continue PPI therapy and H2 blocker.

## 2023-06-17 NOTE — Assessment & Plan Note (Addendum)
-   We will continue statin therapy and Lovaza. 

## 2023-06-18 DIAGNOSIS — K7682 Hepatic encephalopathy: Secondary | ICD-10-CM | POA: Diagnosis not present

## 2023-06-18 LAB — HEMOGLOBIN A1C
Hgb A1c MFr Bld: 5.4 % (ref 4.8–5.6)
Mean Plasma Glucose: 108 mg/dL

## 2023-06-18 LAB — BASIC METABOLIC PANEL
Anion gap: 8 (ref 5–15)
BUN: 10 mg/dL (ref 8–23)
CO2: 22 mmol/L (ref 22–32)
Calcium: 8.7 mg/dL — ABNORMAL LOW (ref 8.9–10.3)
Chloride: 108 mmol/L (ref 98–111)
Creatinine, Ser: 0.86 mg/dL (ref 0.61–1.24)
GFR, Estimated: >60 mL/min (ref >60)
Glucose, Bld: 141 mg/dL — ABNORMAL HIGH (ref 70–99)
Potassium: 3.4 mmol/L — ABNORMAL LOW (ref 3.5–5.1)
Sodium: 138 mmol/L (ref 135–145)

## 2023-06-18 LAB — CBC
HCT: 31 % — ABNORMAL LOW (ref 39.0–52.0)
Hemoglobin: 10.5 g/dL — ABNORMAL LOW (ref 13.0–17.0)
MCH: 32.7 pg (ref 26.0–34.0)
MCHC: 33.9 g/dL (ref 30.0–36.0)
MCV: 96.6 fL (ref 80.0–100.0)
Platelets: 43 10*3/uL — ABNORMAL LOW (ref 150–400)
RBC: 3.21 MIL/uL — ABNORMAL LOW (ref 4.22–5.81)
RDW: 15.4 % (ref 11.5–15.5)
WBC: 2.3 10*3/uL — ABNORMAL LOW (ref 4.0–10.5)
nRBC: 0 % (ref 0.0–0.2)

## 2023-06-18 LAB — GLUCOSE, CAPILLARY
Glucose-Capillary: 126 mg/dL — ABNORMAL HIGH (ref 70–99)
Glucose-Capillary: 177 mg/dL — ABNORMAL HIGH (ref 70–99)

## 2023-06-18 MED ORDER — LACTULOSE 10 GM/15ML PO SOLN: 30.0000 g | Freq: Three times a day (TID) | ORAL | 0 refills | Status: DC

## 2023-06-18 MED ORDER — POTASSIUM CHLORIDE CRYS ER 20 MEQ PO TBCR
40.0000 meq | EXTENDED_RELEASE_TABLET | Freq: Once | ORAL | Status: AC
  Administered 2023-06-18: 40 meq via ORAL
  Filled 2023-06-18: qty 2

## 2023-06-18 NOTE — Progress Notes (Signed)
Pt being discharged, discharge instructions reviewed with pt and daughter, states understanding, pt with no complaints  

## 2023-06-18 NOTE — TOC Transition Note (Signed)
Transition of Care [facility]) - CM/SW Discharge Note   Patient Details  Name: Joshua Berry MRN: [id number] Date of Birth: 1946-06-28  [facility] of [facility]) CM/SW Contact:  Allena Katz, LCSW Phone Number: 06/18/2023, 1:43 PM   Clinical Narrative:   Pt to discharge back to ALF. Family to transport. [name] from ALF notified. FL2 to be sent with family.    Final next level of care: Assisted Living Barriers to Discharge: Barriers Resolved   Patient Goals and CMS Choice      Discharge Placement                         Discharge Plan and Services Additional resources added to the After Visit Summary for                                       Social Determinants of Health (SDOH) Interventions SDOH Screenings   Food Insecurity: No Food Insecurity (06/17/2023)  Housing: Low Risk  (06/17/2023)  Transportation Needs: Unmet Transportation Needs (06/17/2023)  Utilities: At Risk (06/17/2023)  Depression (PHQ2-9): Low Risk  (04/24/2023)  Tobacco Use: Medium Risk (04/24/2023)     Readmission Risk Interventions    06/18/2023   10:19 AM 05/17/2022    1:45 PM  Readmission Risk Prevention Plan  Transportation Screening Complete Complete  PCP or Specialist Appt within 5-7 Days Complete Complete  Home Care Screening Complete Complete  Medication Review (RN CM) Complete Complete

## 2023-06-18 NOTE — Discharge Summary (Signed)
Physician Discharge Summary   [name] MRN: [id number] DOB: Oct 22, 1946  Admit date:     06/16/2023  Discharge date: 06/18/23  Discharge Physician: Florinda Taflinger   PCP: [facility]   Recommendations at discharge:   Take lactulose as recommended  Discharge Diagnoses: Principal Problem:   Hepatic encephalopathy (HCC) Active Problems:   Acute hepatic encephalopathy (HCC)   Liver cirrhosis secondary to NASH (HCC)   Pancytopenia (HCC)   Hypothyroidism   Type 2 diabetes mellitus without complications (HCC)   GERD without esophagitis   Dyslipidemia   Pressure injury of skin  Resolved Problems:   * No resolved hospital problems. Joshua Berry Course:  Joshua Berry is a 77 y.o.  male with medical history significant for type II DM, hypertension, thrombocytopenia and liver cirrhosis with NASH, OSA, who presented to the emergency room with acute onset of generalized fatigue and abnormal blood work.  Per his daughter the patient has been more fatigued over the past week and his PCP was concerned about leukopenia of 2.1 and thrombocytopenia of 48.  The patient denies any bleeding diathesis.  He receives iron transfusion for anemia.  No fever or chills.  No nausea or vomiting or abdominal pain.  No headache or dizziness or blurred vision.  He admits to mild somnolence and confusion.  No dysuria, oliguria or hematuria or flank pain.  He has chronic diarrhea with lactulose intake.  No cough or wheezing or hemoptysis.   ED Course: When he came to the ER, BP was 116/59 with otherwise normal vital signs.  Labs revealed hyponatremia of 134 and CO2 of 20 with glucose of 126 albumin 3.2 and AST 52.  Ammonia level came back significantly elevated at 127 and total bili was 1.7.  Lactic acid was 2.2 and later 1.5 and CBC showed WBC of 2.1 with neutropenia and anemia.  UA was unremarkable. EKG as reviewed by me : EKG showed normal sinus rhythm with a rate of 63 and PACs, prolonged PR  interval and left anterior fascicular block. Imaging: Portable chest x-ray showed no acute cardiopulmonary disease.   The patient was given p.o. lactulose and 1 L bolus of IV bolus of IV normal saline.  He will be admitted to a medical telemetry bed for further evaluation and management.    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   Dyslipidemia - Continue statin therapy and Lovaza.   GERD without esophagitis - We will continue PPI therapy and H2 blocker.   Type 2 diabetes mellitus without complications (HCC) - Continue scheduled NovoLog and long-acting insulin Maintain consistent carbohydrate diet        Consultants: None Procedures performed: None Disposition: Assisted living Diet recommendation:  Discharge Diet Orders (From admission, onward)     Start     Ordered   06/18/23 0000  Diet Carb Modified        06/18/23 1231           Cardiac and Carb modified diet DISCHARGE MEDICATION: Allergies as of 06/18/2023       Reactions   Nsaids    Other Reaction(s): Ascites   Camphor Rash, Swelling   Nickel Rash   Sulfur Rash, Swelling   Sulfamethoxazole Swelling   Latex Rash   Lisinopril Hives   Other Reaction(s): Cough  Medication List     TAKE these medications    ascorbic acid 500 MG tablet Commonly known as: VITAMIN C Take 1,000 mg by mouth daily.   Aspercreme Lidocaine 4 % Liqd Generic drug: Lidocaine HCl Apply 1 Application topically daily as needed.   atorvastatin 20 MG tablet Commonly known as: LIPITOR Take 20 mg by mouth at bedtime.   augmented betamethasone dipropionate 0.05 % cream Commonly known as: DIPROLENE-AF Apply 1 application topically 2 (two) times daily.   Cholecalciferol 50 MCG (2000 UT) Tabs Take 1 tablet by mouth  daily.   clotrimazole 1 % external solution Commonly known as: LOTRIMIN Apply 1 Application topically 2 (two) times daily. Apply to toe nails   famotidine 20 MG tablet Commonly known as: PEPCID Take 20 mg by mouth daily.   Fish Oil 1000 MG Caps Take 1 capsule by mouth 2 (two) times daily.   folic acid 1 MG tablet Commonly known as: FOLVITE Take 1 tablet by mouth daily.   furosemide 20 MG tablet Commonly known as: LASIX Take 1 tablet (20 mg total) by mouth daily. What changed:  when to take this additional instructions   insulin aspart 100 UNIT/ML injection Commonly known as: novoLOG Inject 10 Units into the skin 3 (three) times daily before meals. What changed:  how much to take when to take this additional instructions   insulin glargine 100 UNIT/ML injection Commonly known as: LANTUS Inject 0.14 mLs (14 Units total) into the skin daily. What changed: how much to take   ketotifen 0.025 % ophthalmic solution Commonly known as: ZADITOR Place 1 drop into both eyes 2 (two) times daily.   lactulose 10 GM/15ML solution Commonly known as: CHRONULAC Take 45 mLs (30 g total) by mouth 3 (three) times daily.   levothyroxine 100 MCG tablet Commonly known as: SYNTHROID Take 100 mcg by mouth daily before breakfast.   lidocaine 5 % Commonly known as: LIDODERM Place 2 patches onto the skin daily. Remove & Discard patch within 12 hours or as directed by MD   magnesium oxide 400 MG tablet Commonly known as: MAG-OX Take 2 tablets (800 mg total) by mouth 2 (two) times daily.   Melatonin 10 MG Tabs Take 10 mg by mouth at bedtime.   pantoprazole 40 MG tablet Commonly known as: Protonix Take 1 tablet (40 mg total) by mouth 2 (two) times daily.   RA Probiotic Digestive Care Caps Take 1 capsule by mouth daily.   rifaximin 550 MG Tabs tablet Commonly known as: XIFAXAN Take 550 mg by mouth 2 (two) times daily. Takes with lactulose   Semaglutide(0.25 or 0.5MG /DOS) 2  MG/1.5ML Sopn Inject 0.5 mg into the skin once a week. Thursday   terazosin 5 MG capsule Commonly known as: HYTRIN Take 5 mg by mouth at bedtime.   trolamine salicylate 10 % cream Commonly known as: ASPERCREME Apply topically as needed for muscle pain.   zinc oxide 20 % ointment Apply 1 Application topically in the morning and at bedtime. APPLY SMALL AMOUNT TO AFFECTED AREA TWICE A DAY FOR BUTTOCK RASH FOR BUTTOCK RASH        Discharge Exam: Filed Weights   06/16/23 2206  Weight: 101.6 kg    GENERAL:  77 y.o.-year-old Caucasian male patient lying in the bed with no acute distress.  EYES: Pupils equal, round, reactive to light and accommodation. No scleral icterus. Extraocular muscles intact.  HEENT: Head atraumatic, normocephalic. Oropharynx and nasopharynx clear.  NECK:  Supple, no jugular venous distention.  No thyroid enlargement, no tenderness.  LUNGS: Normal breath sounds bilaterally, no wheezing, rales,rhonchi or crepitation. No use of accessory muscles of respiration.  CARDIOVASCULAR: Regular rate and rhythm, S1, S2 normal. No murmurs, rubs, or gallops.  ABDOMEN: Soft, nondistended, nontender. Bowel sounds present. No organomegaly or mass.  EXTREMITIES: No pedal edema, cyanosis, or clubbing.  NEUROLOGIC: Cranial nerves II through XII are intact. Muscle strength 5/5 in all extremities. Sensation intact. Gait not checked.  PSYCHIATRIC: The patient is alert and oriented x 3.  Normal affect and good eye contact. SKIN: No obvious rash, lesion, or ulcer.    Condition at discharge: stable  The results of significant diagnostics from this hospitalization (including imaging, microbiology, ancillary and laboratory) are listed below for reference.   Imaging Studies: No results found.  Microbiology: Results for orders placed or performed during the hospital encounter of 06/16/23  MRSA Next Gen by PCR, Nasal     Status: None   Collection Time: 06/17/23 11:15 AM   Specimen:  Nasal Mucosa; Nasal Swab  Result Value Ref Range Status   MRSA by PCR Next Gen NOT DETECTED NOT DETECTED Final    Comment: (NOTE) The GeneXpert MRSA Assay (FDA approved for NASAL specimens only), is one component of a comprehensive MRSA colonization surveillance program. It is not intended to diagnose MRSA infection nor to guide or monitor treatment for MRSA infections. Test performance is not FDA approved in patients less than 43 years old. Performed at [facility], [address] [place], [place] [address]     Labs: CBC: Recent Labs  Lab 06/16/23 2216 06/18/23 0515  WBC 2.1* 2.3*  NEUTROABS 1.2*  --   HGB 12.2* 10.5*  HCT 34.9* 31.0*  MCV 94.1 96.6  PLT 49* 43*   Basic Metabolic Panel: Recent Labs  Lab 06/16/23 2216 06/18/23 0515  NA 134* 138  K 3.8 3.4*  CL 104 108  CO2 20* 22  GLUCOSE 126* 141*  BUN 12 10  CREATININE 0.96 0.86  CALCIUM 9.2 8.7*   Liver Function Tests: Recent Labs  Lab 06/16/23 2216  AST 52*  ALT 33  ALKPHOS 113  BILITOT 1.7*  PROT 6.7  ALBUMIN 3.2*   CBG: Recent Labs  Lab 06/17/23 1232 06/17/23 1538 06/17/23 1929 06/18/23 0722 06/18/23 1204  GLUCAP 98 181* 149* 126* 177*    Discharge time spent: greater than 30 minutes.  Signed: Lucile Shutters, MD [facility] 06/18/2023

## 2023-06-18 NOTE — TOC Initial Note (Signed)
Transition of Care [facility]) - Initial/Assessment Note    Patient Details  Name: Joshua Berry MRN: [id number] Date of Birth: 29-Sep-1946  Transition [facility]) CM/SW Contact:    Allena Katz, LCSW Phone Number: 06/18/2023, 10:19 AM  Clinical Narrative:     Pt admitted from [facility]. Pt reports he will return here after discharge. Pt reports he is service connected and active with his PCP at the [place]. Pt reports he walks very well with his RW and Rollator. Pt reports no transportation concerns with appointments as he says his daughter typically assists him with this.                     Patient Goals and CMS Choice            Expected Discharge Plan and Services                                              Prior Living Arrangements/Services                       Activities of Daily Living Home Assistive Devices/Equipment: Eyeglasses, Cane (specify quad or straight), Walker (specify type) ADL Screening (condition at time of admission) Patient's cognitive ability adequate to safely complete daily activities?: Yes Is the patient deaf or have difficulty hearing?: No Does the patient have difficulty seeing, even when wearing glasses/contacts?: No Does the patient have difficulty concentrating, remembering, or making decisions?: Yes Patient able to express need for assistance with ADLs?: Yes Does the patient have difficulty dressing or bathing?: Yes Independently performs ADLs?: No Communication: Independent Dressing (OT): Needs assistance Is this a change from baseline?: Change from baseline, expected to last <3days Grooming: Needs assistance Is this a change from baseline?: Change from baseline, expected to last <3 days Feeding: Independent Bathing: Needs assistance Is this a change from baseline?: Change from baseline, expected to last <3 days Toileting: Needs assistance Is this a change from baseline?: Change from baseline, expected to last <3  days In/Out Bed: Needs assistance Is this a change from baseline?: Change from baseline, expected to last <3 days Walks in Home: Needs assistance Is this a change from baseline?: Change from baseline, expected to last <3 days Does the patient have difficulty walking or climbing stairs?: Yes Weakness of Legs: Right Weakness of Arms/Hands: Both  Permission Sought/Granted                  Emotional Assessment              Admission diagnosis:  Hepatic encephalopathy (HCC) [K76.82] Hyperammonemia (HCC) [E72.20] Patient Active Problem List   Diagnosis Date Noted   Type 2 diabetes mellitus without complications (HCC) 06/17/2023   GERD without esophagitis 06/17/2023   Dyslipidemia 06/17/2023   Pressure injury of skin 06/17/2023   Acute urinary retention 04/11/2023   Streptococcal bacteremia 04/07/2023   Altered mental status 04/06/2023   Hepatic encephalopathy (HCC) 04/05/2023   Overweight (BMI 25.0-29.9) 08/03/2022   Hyponatremia, hypokalemia and hypomagnesemia 05/18/2022   Hyponatremia 05/18/2022   Hypokalemia 05/18/2022   Back pain 05/17/2022   Hypomagnesemia 05/17/2022   Acute hepatic encephalopathy (HCC) 05/17/2022   AKI (acute kidney injury) (HCC) 05/16/2022   Elevated liver enzymes 05/16/2022   Hypotension 05/16/2022   Right shoulder pain 05/16/2022   Hypothyroidism 05/16/2022  Physical deconditioning 05/16/2022   Obesity (BMI 30-39.9) 05/16/2022   Lactic acidosis 05/16/2022   Fall at home, initial encounter 05/16/2022   Septic shock due to Staphylococcus bacteremia 05/14/2022   Chest pain 04/06/2022   Controlled IDDM-2 with hyperglycemia 04/06/2022   HLD (hyperlipidemia) 04/06/2022   HTN (hypertension) 04/06/2022   Chronic diastolic CHF (congestive heart failure) (HCC) 04/06/2022   Pancytopenia (HCC) 04/06/2022   GI bleeding 04/24/2021   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    Secondary esophageal varices without  bleeding (HCC)    Portal hypertension (HCC)    Stomach irritation    Melena    Acute gastrointestinal hemorrhage 07/19/2019   Severe sepsis (HCC) 12/20/2018   PCP:  [facility] Pharmacy:   [facility] - [place], [place] - [address] [place] [address] Phone: [phone] Fax: [phone]     Social Determinants of Health (SDOH) Social History: SDOH Screenings   Food Insecurity: No Food Insecurity (06/17/2023)  Housing: Low Risk  (06/17/2023)  Transportation Needs: Unmet Transportation Needs (06/17/2023)  Utilities: At Risk (06/17/2023)  Depression (PHQ2-9): Low Risk  (04/24/2023)  Tobacco Use: Medium Risk (04/24/2023)   SDOH Interventions:     Readmission Risk Interventions    06/18/2023   10:19 AM 05/17/2022    1:45 PM  Readmission Risk Prevention Plan  Transportation Screening Complete Complete  PCP or Specialist Appt within 5-7 Days Complete Complete  Home Care Screening Complete Complete  Medication Review (RN CM) Complete Complete

## 2023-06-18 NOTE — NC FL2 (Signed)
[place] MEDICAID FL2 LEVEL OF CARE FORM     IDENTIFICATION  Patient Name: SEDARIUS POLEN Birthdate: 09-09-1946 Sex: male Admission Date (Current Location): 06/16/2023  [facility] and [place] Number:  Chiropodist and Address:  [facility], [address], [place], [place] [address]      Provider Number: [id number]  Attending Physician Name and Address:  Lucile Shutters, MD  Relative Name and Phone Number:  Roby Lofts (Daughter) [phone]    Current Level of Care: Hospital Recommended Level of Care: Assisted Living Facility Prior Approval Number:    Date Approved/Denied:   PASRR Number: [id number]  Discharge Plan: Other (Comment)    Current Diagnoses: Patient Active Problem List   Diagnosis Date Noted   Type 2 diabetes mellitus without complications (HCC) 06/17/2023   GERD without esophagitis 06/17/2023   Dyslipidemia 06/17/2023   Pressure injury of skin 06/17/2023   Acute urinary retention 04/11/2023   Streptococcal bacteremia 04/07/2023   Altered mental status 04/06/2023   Hepatic encephalopathy (HCC) 04/05/2023   Overweight (BMI 25.0-29.9) 08/03/2022   Hyponatremia, hypokalemia and hypomagnesemia 05/18/2022   Hyponatremia 05/18/2022   Hypokalemia 05/18/2022   Back pain 05/17/2022   Hypomagnesemia 05/17/2022   Acute hepatic encephalopathy (HCC) 05/17/2022   AKI (acute kidney injury) (HCC) 05/16/2022   Elevated liver enzymes 05/16/2022   Hypotension 05/16/2022   Right shoulder pain 05/16/2022   Hypothyroidism 05/16/2022   Physical deconditioning 05/16/2022   Obesity (BMI 30-39.9) 05/16/2022   Lactic acidosis 05/16/2022   Fall at home, initial encounter 05/16/2022   Septic shock due to Staphylococcus bacteremia 05/14/2022   Chest pain 04/06/2022   Controlled IDDM-2 with hyperglycemia 04/06/2022   HLD (hyperlipidemia) 04/06/2022   HTN (hypertension) 04/06/2022   Chronic diastolic CHF (congestive heart failure) (HCC)  04/06/2022   Pancytopenia (HCC) 04/06/2022   GI bleeding 04/24/2021   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    Secondary esophageal varices without bleeding (HCC)    Portal hypertension (HCC)    Stomach irritation    Melena    Acute gastrointestinal hemorrhage 07/19/2019   Severe sepsis (HCC) 12/20/2018    Orientation RESPIRATION BLADDER Height & Weight     Self, Time, Situation, Place  Normal   Weight: 224 lb (101.6 kg) Height:  6\' 2"  (188 cm)  BEHAVIORAL SYMPTOMS/MOOD NEUROLOGICAL BOWEL NUTRITION STATUS        Diet  AMBULATORY STATUS COMMUNICATION OF NEEDS Skin     Verbally Normal                       Personal Care Assistance Level of Assistance              Functional Limitations Info  Sight, Hearing, Speech Sight Info: Adequate Hearing Info: Adequate Speech Info: Adequate    SPECIAL CARE FACTORS FREQUENCY                       Contractures Contractures Info: Not present    Additional Factors Info  Code Status, Allergies Code Status Info: DNR Allergies Info: Nsaids  Camphor  Nickel  Sulfur  Sulfamethoxazole  Latex  Lisinopril           Current Medications (06/18/2023):  This is the current hospital active medication list Current Facility-Administered Medications  Medication Dose Route Frequency Provider Last Rate Last Admin   0.9 %  sodium chloride infusion   Intravenous Continuous Mansy, Vernetta Honey, MD   Stopped at 06/18/23  1104   acetaminophen (TYLENOL) tablet 650 mg  650 mg Oral Q6H PRN Mansy, Jan A, MD       Or   acetaminophen (TYLENOL) suppository 650 mg  650 mg Rectal Q6H PRN Mansy, Jan A, MD       acidophilus (RISAQUAD) capsule 1 capsule  1 capsule Oral Daily Mansy, Jan A, MD   1 capsule at 06/18/23 1610   ascorbic acid (VITAMIN C) tablet 1,000 mg  1,000 mg Oral Daily Mansy, Jan A, MD   1,000 mg at 06/18/23 0905   atorvastatin (LIPITOR) tablet 20 mg  20 mg Oral QHS Mansy, Jan A, MD   20 mg at 06/17/23 2042    cholecalciferol (VITAMIN D3) 25 MCG (1000 UNIT) tablet 2,000 Units  2,000 Units Oral Daily Mansy, Jan A, MD   2,000 Units at 06/18/23 0905   famotidine (PEPCID) tablet 20 mg  20 mg Oral Daily Mansy, Jan A, MD   20 mg at 06/18/23 9604   feeding supplement (BOOST / RESOURCE BREEZE) liquid 1 Container  1 Container Oral TID BM Tresa Moore, MD   1 Container at 06/18/23 0903   folic acid (FOLVITE) tablet 1 mg  1 mg Oral Daily Mansy, Jan A, MD   1 mg at 06/18/23 5409   hydrocortisone cream 1 %   Topical QID Lolita Patella B, MD   Given at 06/18/23 0904   insulin aspart (novoLOG) injection 0-20 Units  0-20 Units Subcutaneous TID WC Mansy, Vernetta Honey, MD   3 Units at 06/18/23 0751   insulin aspart (novoLOG) injection 0-5 Units  0-5 Units Subcutaneous QHS Mansy, Jan A, MD       insulin glargine-yfgn [facility]) injection 14 Units  14 Units Subcutaneous Daily Mansy, Jan A, MD   14 Units at 06/18/23 0904   ketotifen (ZADITOR) 0.035 % ophthalmic solution 1 drop  1 drop Both Eyes BID Mansy, Jan A, MD   1 drop at 06/18/23 0905   lactulose (CHRONULAC) 10 GM/15ML solution 30 g  30 g Oral TID Mansy, Jan A, MD   30 g at 06/18/23 0904   levothyroxine (SYNTHROID) tablet 100 mcg  100 mcg Oral Q0600 Mansy, Jan A, MD   100 mcg at 06/18/23 0603   lidocaine (LIDODERM) 5 % 2 patch  2 patch Transdermal Q24H Mansy, Jan A, MD   2 patch at 06/18/23 0606   magnesium hydroxide (MILK OF MAGNESIA) suspension 30 mL  30 mL Oral Daily PRN Mansy, Jan A, MD       magnesium oxide (MAG-OX) tablet 800 mg  800 mg Oral BID Mansy, Jan A, MD   800 mg at 06/18/23 8119   melatonin tablet 10 mg  10 mg Oral QHS Mansy, Jan A, MD   10 mg at 06/17/23 2041   multivitamin with minerals tablet 1 tablet  1 tablet Oral Daily Lolita Patella B, MD   1 tablet at 06/18/23 0905   omega-3 acid ethyl esters (LOVAZA) capsule 1 g  1 g Oral BID Mansy, Jan A, MD   1 g at 06/18/23 0905   ondansetron (ZOFRAN) tablet 4 mg  4 mg Oral Q6H PRN Mansy, Jan A, MD        Or   ondansetron [facility]) injection 4 mg  4 mg Intravenous Q6H PRN Mansy, Jan A, MD       pantoprazole (PROTONIX) EC tablet 40 mg  40 mg Oral BID Mansy, Jan A, MD   40 mg at 06/18/23 [phone]  rifaximin (XIFAXAN) tablet 550 mg  550 mg Oral BID Mansy, Jan A, MD   550 mg at 06/18/23 8295   sodium chloride (OCEAN) 0.65 % nasal spray 1 spray  1 spray Each Nare PRN Lolita Patella B, MD   1 spray at 06/17/23 2254   terazosin (HYTRIN) capsule 5 mg  5 mg Oral QHS Mansy, Jan A, MD   5 mg at 06/17/23 2043   traZODone (DESYREL) tablet 25 mg  25 mg Oral QHS PRN Mansy, Jan A, MD   25 mg at 06/17/23 2042   triamcinolone cream (KENALOG) 0.5 %   Topical BID PRN Mansy, Vernetta Honey, MD         Discharge Medications: Please see discharge summary for a list of discharge medications. Medication List       TAKE these medications     ascorbic acid 500 MG tablet Commonly known as: VITAMIN C Take 1,000 mg by mouth daily.    Aspercreme Lidocaine 4 % Liqd Generic drug: Lidocaine HCl Apply 1 Application topically daily as needed.    atorvastatin 20 MG tablet Commonly known as: LIPITOR Take 20 mg by mouth at bedtime.    augmented betamethasone dipropionate 0.05 % cream Commonly known as: DIPROLENE-AF Apply 1 application topically 2 (two) times daily.    Cholecalciferol 50 MCG (2000 UT) Tabs Take 1 tablet by mouth daily.    clotrimazole 1 % external solution Commonly known as: LOTRIMIN Apply 1 Application topically 2 (two) times daily. Apply to toe nails    famotidine 20 MG tablet Commonly known as: PEPCID Take 20 mg by mouth daily.    Fish Oil 1000 MG Caps Take 1 capsule by mouth 2 (two) times daily.    folic acid 1 MG tablet Commonly known as: FOLVITE Take 1 tablet by mouth daily.    furosemide 20 MG tablet Commonly known as: LASIX Take 1 tablet (20 mg total) by mouth daily. What changed:  when to take this additional instructions    insulin aspart 100 UNIT/ML injection Commonly known  as: novoLOG Inject 10 Units into the skin 3 (three) times daily before meals. What changed:  how much to take when to take this additional instructions    insulin glargine 100 UNIT/ML injection Commonly known as: LANTUS Inject 0.14 mLs (14 Units total) into the skin daily. What changed: how much to take    ketotifen 0.025 % ophthalmic solution Commonly known as: ZADITOR Place 1 drop into both eyes 2 (two) times daily.    lactulose 10 GM/15ML solution Commonly known as: CHRONULAC Take 45 mLs (30 g total) by mouth 3 (three) times daily.    levothyroxine 100 MCG tablet Commonly known as: SYNTHROID Take 100 mcg by mouth daily before breakfast.    lidocaine 5 % Commonly known as: LIDODERM Place 2 patches onto the skin daily. Remove & Discard patch within 12 hours or as directed by MD    magnesium oxide 400 MG tablet Commonly known as: MAG-OX Take 2 tablets (800 mg total) by mouth 2 (two) times daily.    Melatonin 10 MG Tabs Take 10 mg by mouth at bedtime.    pantoprazole 40 MG tablet Commonly known as: Protonix Take 1 tablet (40 mg total) by mouth 2 (two) times daily.    RA Probiotic Digestive Care Caps Take 1 capsule by mouth daily.    rifaximin 550 MG Tabs tablet Commonly known as: XIFAXAN Take 550 mg by mouth 2 (two) times daily. Takes with lactulose  Semaglutide(0.25 or 0.5MG /DOS) 2 MG/1.5ML Sopn Inject 0.5 mg into the skin once a week. Thursday    terazosin 5 MG capsule Commonly known as: HYTRIN Take 5 mg by mouth at bedtime.    trolamine salicylate 10 % cream Commonly known as: ASPERCREME Apply topically as needed for muscle pain.    zinc oxide 20 % ointment Apply 1 Application topically in the morning and at bedtime. APPLY SMALL AMOUNT TO AFFECTED AREA TWICE A DAY FOR BUTTOCK RASH FOR BUTTOCK RASH      Relevant Imaging Results:  Relevant Lab Results:   Additional Information SSN [id number]  Allena Katz, LCSW

## 2023-07-14 ENCOUNTER — Other Ambulatory Visit: Payer: Self-pay

## 2023-07-14 ENCOUNTER — Emergency Department: Payer: No Typology Code available for payment source

## 2023-07-14 ENCOUNTER — Encounter
Admission: EM | Disposition: A | Discharge status: Other Acute Inpatient Hospital | Payer: Self-pay | Source: Skilled Nursing Facility | Attending: Hospitalist

## 2023-07-14 ENCOUNTER — Emergency Department: Payer: No Typology Code available for payment source | Admitting: Anesthesiology

## 2023-07-14 ENCOUNTER — Inpatient Hospital Stay
Admission: EM | Admit: 2023-07-14 | Discharge: 2023-08-01 | DRG: 025 | Disposition: A | Discharge status: Other Acute Inpatient Hospital | Payer: No Typology Code available for payment source | Source: Skilled Nursing Facility | Attending: Student | Admitting: Student

## 2023-07-14 DIAGNOSIS — E871 Hypo-osmolality and hyponatremia: Secondary | ICD-10-CM | POA: Diagnosis not present

## 2023-07-14 DIAGNOSIS — I864 Gastric varices: Secondary | ICD-10-CM | POA: Diagnosis present

## 2023-07-14 DIAGNOSIS — Z794 Long term (current) use of insulin: Secondary | ICD-10-CM | POA: Diagnosis not present

## 2023-07-14 DIAGNOSIS — Z5982 Transportation insecurity: Secondary | ICD-10-CM

## 2023-07-14 DIAGNOSIS — R471 Dysarthria and anarthria: Secondary | ICD-10-CM | POA: Diagnosis not present

## 2023-07-14 DIAGNOSIS — D61818 Other pancytopenia: Secondary | ICD-10-CM | POA: Diagnosis not present

## 2023-07-14 DIAGNOSIS — L89151 Pressure ulcer of sacral region, stage 1: Secondary | ICD-10-CM | POA: Diagnosis not present

## 2023-07-14 DIAGNOSIS — R400 Somnolence: Secondary | ICD-10-CM | POA: Diagnosis not present

## 2023-07-14 DIAGNOSIS — E785 Hyperlipidemia, unspecified: Secondary | ICD-10-CM | POA: Diagnosis not present

## 2023-07-14 DIAGNOSIS — Z888 Allergy status to other drugs, medicaments and biological substances status: Secondary | ICD-10-CM

## 2023-07-14 DIAGNOSIS — D509 Iron deficiency anemia, unspecified: Secondary | ICD-10-CM | POA: Diagnosis present

## 2023-07-14 DIAGNOSIS — L89321 Pressure ulcer of left buttock, stage 1: Secondary | ICD-10-CM | POA: Diagnosis present

## 2023-07-14 DIAGNOSIS — Z952 Presence of prosthetic heart valve: Secondary | ICD-10-CM | POA: Diagnosis not present

## 2023-07-14 DIAGNOSIS — E119 Type 2 diabetes mellitus without complications: Secondary | ICD-10-CM | POA: Diagnosis present

## 2023-07-14 DIAGNOSIS — Z882 Allergy status to sulfonamides status: Secondary | ICD-10-CM

## 2023-07-14 DIAGNOSIS — E039 Hypothyroidism, unspecified: Secondary | ICD-10-CM | POA: Diagnosis present

## 2023-07-14 DIAGNOSIS — Z85820 Personal history of malignant melanoma of skin: Secondary | ICD-10-CM

## 2023-07-14 DIAGNOSIS — R4182 Altered mental status, unspecified: Secondary | ICD-10-CM

## 2023-07-14 DIAGNOSIS — I6201 Nontraumatic acute subdural hemorrhage: Principal | ICD-10-CM | POA: Diagnosis present

## 2023-07-14 DIAGNOSIS — M79603 Pain in arm, unspecified: Secondary | ICD-10-CM | POA: Diagnosis not present

## 2023-07-14 DIAGNOSIS — R509 Fever, unspecified: Secondary | ICD-10-CM | POA: Diagnosis not present

## 2023-07-14 DIAGNOSIS — M1990 Unspecified osteoarthritis, unspecified site: Secondary | ICD-10-CM | POA: Diagnosis present

## 2023-07-14 DIAGNOSIS — Z515 Encounter for palliative care: Secondary | ICD-10-CM | POA: Diagnosis not present

## 2023-07-14 DIAGNOSIS — G935 Compression of brain: Secondary | ICD-10-CM | POA: Diagnosis present

## 2023-07-14 DIAGNOSIS — K219 Gastro-esophageal reflux disease without esophagitis: Secondary | ICD-10-CM | POA: Diagnosis not present

## 2023-07-14 DIAGNOSIS — Z8719 Personal history of other diseases of the digestive system: Secondary | ICD-10-CM

## 2023-07-14 DIAGNOSIS — I5032 Chronic diastolic (congestive) heart failure: Secondary | ICD-10-CM | POA: Diagnosis not present

## 2023-07-14 DIAGNOSIS — G039 Meningitis, unspecified: Secondary | ICD-10-CM | POA: Diagnosis not present

## 2023-07-14 DIAGNOSIS — Z1152 Encounter for screening for COVID-19: Secondary | ICD-10-CM | POA: Diagnosis not present

## 2023-07-14 DIAGNOSIS — I10 Essential (primary) hypertension: Secondary | ICD-10-CM | POA: Diagnosis present

## 2023-07-14 DIAGNOSIS — I8510 Secondary esophageal varices without bleeding: Secondary | ICD-10-CM | POA: Diagnosis present

## 2023-07-14 DIAGNOSIS — Z66 Do not resuscitate: Secondary | ICD-10-CM | POA: Diagnosis present

## 2023-07-14 DIAGNOSIS — S065XAA Traumatic subdural hemorrhage with loss of consciousness status unknown, initial encounter: Secondary | ICD-10-CM | POA: Diagnosis present

## 2023-07-14 DIAGNOSIS — E440 Moderate protein-calorie malnutrition: Secondary | ICD-10-CM | POA: Diagnosis not present

## 2023-07-14 DIAGNOSIS — R161 Splenomegaly, not elsewhere classified: Secondary | ICD-10-CM | POA: Diagnosis present

## 2023-07-14 DIAGNOSIS — K7682 Hepatic encephalopathy: Secondary | ICD-10-CM | POA: Diagnosis not present

## 2023-07-14 DIAGNOSIS — Z87891 Personal history of nicotine dependence: Secondary | ICD-10-CM

## 2023-07-14 DIAGNOSIS — Z79890 Hormone replacement therapy (postmenopausal): Secondary | ICD-10-CM

## 2023-07-14 DIAGNOSIS — G9340 Encephalopathy, unspecified: Secondary | ICD-10-CM | POA: Diagnosis not present

## 2023-07-14 DIAGNOSIS — Z886 Allergy status to analgesic agent status: Secondary | ICD-10-CM

## 2023-07-14 DIAGNOSIS — Z8249 Family history of ischemic heart disease and other diseases of the circulatory system: Secondary | ICD-10-CM

## 2023-07-14 DIAGNOSIS — G9782 Other postprocedural complications and disorders of nervous system: Secondary | ICD-10-CM | POA: Diagnosis not present

## 2023-07-14 DIAGNOSIS — D6959 Other secondary thrombocytopenia: Secondary | ICD-10-CM | POA: Diagnosis present

## 2023-07-14 DIAGNOSIS — Z79899 Other long term (current) drug therapy: Secondary | ICD-10-CM

## 2023-07-14 DIAGNOSIS — D689 Coagulation defect, unspecified: Secondary | ICD-10-CM | POA: Diagnosis present

## 2023-07-14 DIAGNOSIS — K7460 Unspecified cirrhosis of liver: Secondary | ICD-10-CM | POA: Diagnosis present

## 2023-07-14 DIAGNOSIS — I609 Nontraumatic subarachnoid hemorrhage, unspecified: Secondary | ICD-10-CM | POA: Diagnosis not present

## 2023-07-14 DIAGNOSIS — K766 Portal hypertension: Secondary | ICD-10-CM | POA: Diagnosis present

## 2023-07-14 DIAGNOSIS — K7581 Nonalcoholic steatohepatitis (NASH): Secondary | ICD-10-CM | POA: Diagnosis present

## 2023-07-14 DIAGNOSIS — G9341 Metabolic encephalopathy: Secondary | ICD-10-CM | POA: Diagnosis not present

## 2023-07-14 DIAGNOSIS — G038 Meningitis due to other specified causes: Secondary | ICD-10-CM | POA: Diagnosis not present

## 2023-07-14 DIAGNOSIS — B957 Other staphylococcus as the cause of diseases classified elsewhere: Secondary | ICD-10-CM | POA: Diagnosis not present

## 2023-07-14 DIAGNOSIS — K7210 Chronic hepatic failure without coma: Secondary | ICD-10-CM | POA: Diagnosis present

## 2023-07-14 DIAGNOSIS — R7881 Bacteremia: Secondary | ICD-10-CM | POA: Diagnosis not present

## 2023-07-14 DIAGNOSIS — R569 Unspecified convulsions: Secondary | ICD-10-CM | POA: Diagnosis not present

## 2023-07-14 DIAGNOSIS — Z7985 Long-term (current) use of injectable non-insulin antidiabetic drugs: Secondary | ICD-10-CM

## 2023-07-14 DIAGNOSIS — Z91040 Latex allergy status: Secondary | ICD-10-CM

## 2023-07-14 DIAGNOSIS — Z8619 Personal history of other infectious and parasitic diseases: Secondary | ICD-10-CM

## 2023-07-14 DIAGNOSIS — R339 Retention of urine, unspecified: Secondary | ICD-10-CM | POA: Diagnosis not present

## 2023-07-14 DIAGNOSIS — E1165 Type 2 diabetes mellitus with hyperglycemia: Secondary | ICD-10-CM | POA: Diagnosis not present

## 2023-07-14 DIAGNOSIS — Z91199 Patient's noncompliance with other medical treatment and regimen due to unspecified reason: Secondary | ICD-10-CM

## 2023-07-14 DIAGNOSIS — I493 Ventricular premature depolarization: Secondary | ICD-10-CM | POA: Diagnosis not present

## 2023-07-14 DIAGNOSIS — E038 Other specified hypothyroidism: Secondary | ICD-10-CM | POA: Diagnosis not present

## 2023-07-14 DIAGNOSIS — Z96612 Presence of left artificial shoulder joint: Secondary | ICD-10-CM | POA: Diagnosis present

## 2023-07-14 DIAGNOSIS — G4733 Obstructive sleep apnea (adult) (pediatric): Secondary | ICD-10-CM | POA: Diagnosis present

## 2023-07-14 DIAGNOSIS — Z91048 Other nonmedicinal substance allergy status: Secondary | ICD-10-CM

## 2023-07-14 DIAGNOSIS — Z9049 Acquired absence of other specified parts of digestive tract: Secondary | ICD-10-CM

## 2023-07-14 HISTORY — PX: BURR HOLE: SHX908

## 2023-07-14 HISTORY — DX: Esophageal varices without bleeding: I85.00

## 2023-07-14 LAB — BLOOD GAS, VENOUS
Acid-base deficit: 2.5 mmol/L — ABNORMAL HIGH (ref 0.0–2.0)
Bicarbonate: 21.7 mmol/L (ref 20.0–28.0)
O2 Saturation: 98.3 %
Patient temperature: 37
pCO2, Ven: 35 mmHg — ABNORMAL LOW (ref 44–60)
pH, Ven: 7.4 (ref 7.25–7.43)
pO2, Ven: 81 mmHg — ABNORMAL HIGH (ref 32–45)

## 2023-07-14 LAB — PREPARE PLATELET PHERESIS
Unit division: 0
Unit division: 0
Unit division: 0

## 2023-07-14 LAB — CBC WITH DIFFERENTIAL/PLATELET
Abs Immature Granulocytes: 0.02 10*3/uL (ref 0.00–0.07)
Basophils Absolute: 0 10*3/uL (ref 0.0–0.1)
Basophils Relative: 1 %
Eosinophils Absolute: 0.1 10*3/uL (ref 0.0–0.5)
Eosinophils Relative: 2 %
HCT: 32.1 % — ABNORMAL LOW (ref 39.0–52.0)
Hemoglobin: 11.8 g/dL — ABNORMAL LOW (ref 13.0–17.0)
Immature Granulocytes: 1 %
Lymphocytes Relative: 17 %
Lymphs Abs: 0.6 10*3/uL — ABNORMAL LOW (ref 0.7–4.0)
MCH: 33.8 pg (ref 26.0–34.0)
MCHC: 36.8 g/dL — ABNORMAL HIGH (ref 30.0–36.0)
MCV: 92 fL (ref 80.0–100.0)
Monocytes Absolute: 0.4 10*3/uL (ref 0.1–1.0)
Monocytes Relative: 12 %
Neutro Abs: 2.3 10*3/uL (ref 1.7–7.7)
Neutrophils Relative %: 67 %
Platelets: 43 10*3/uL — ABNORMAL LOW (ref 150–400)
RBC: 3.49 MIL/uL — ABNORMAL LOW (ref 4.22–5.81)
RDW: 14.8 % (ref 11.5–15.5)
WBC: 3.4 10*3/uL — ABNORMAL LOW (ref 4.0–10.5)
nRBC: 0 % (ref 0.0–0.2)

## 2023-07-14 LAB — BPAM PLATELET PHERESIS
Blood Product Expiration Date: 202407312359
Blood Product Expiration Date: 202407312359
Blood Product Expiration Date: 202407312359
ISSUE DATE / TIME: 202407291538
ISSUE DATE / TIME: 202407292319
Unit Type and Rh: 9500
Unit Type and Rh: 6200
Unit Type and Rh: 7300

## 2023-07-14 LAB — BPAM FFP
Blood Product Expiration Date: 202408032359
Blood Product Expiration Date: 202408032359
ISSUE DATE / TIME: 202407291623
ISSUE DATE / TIME: 202407292146
Unit Type and Rh: 5100
Unit Type and Rh: 600

## 2023-07-14 LAB — PREPARE FRESH FROZEN PLASMA

## 2023-07-14 LAB — COMPREHENSIVE METABOLIC PANEL
ALT: 37 U/L (ref 0–44)
AST: 50 U/L — ABNORMAL HIGH (ref 15–41)
Albumin: 3 g/dL — ABNORMAL LOW (ref 3.5–5.0)
Alkaline Phosphatase: 97 U/L (ref 38–126)
Anion gap: 9 (ref 5–15)
BUN: 14 mg/dL (ref 8–23)
CO2: 22 mmol/L (ref 22–32)
Calcium: 8.9 mg/dL (ref 8.9–10.3)
Chloride: 107 mmol/L (ref 98–111)
Creatinine, Ser: 1.04 mg/dL (ref 0.61–1.24)
GFR, Estimated: >60 mL/min (ref >60)
Glucose, Bld: 120 mg/dL — ABNORMAL HIGH (ref 70–99)
Potassium: 3.8 mmol/L (ref 3.5–5.1)
Sodium: 138 mmol/L (ref 135–145)
Total Bilirubin: 2 mg/dL — ABNORMAL HIGH (ref 0.3–1.2)
Total Protein: 6.2 g/dL — ABNORMAL LOW (ref 6.5–8.1)

## 2023-07-14 LAB — CBC
HCT: 31.8 % — ABNORMAL LOW (ref 39.0–52.0)
Hemoglobin: 11.3 g/dL — ABNORMAL LOW (ref 13.0–17.0)
MCH: 32.9 pg (ref 26.0–34.0)
MCHC: 35.5 g/dL (ref 30.0–36.0)
MCV: 92.7 fL (ref 80.0–100.0)
Platelets: 44 10*3/uL — ABNORMAL LOW (ref 150–400)
RBC: 3.43 MIL/uL — ABNORMAL LOW (ref 4.22–5.81)
RDW: 14.8 % (ref 11.5–15.5)
WBC: 3.8 10*3/uL — ABNORMAL LOW (ref 4.0–10.5)
nRBC: 0 % (ref 0.0–0.2)

## 2023-07-14 LAB — AMMONIA: Ammonia: 65 umol/L — ABNORMAL HIGH (ref 9–35)

## 2023-07-14 LAB — TYPE AND SCREEN
ABO/RH(D): O NEG
Antibody Screen: NEGATIVE

## 2023-07-14 LAB — URINALYSIS, W/ REFLEX TO CULTURE (INFECTION SUSPECTED)
Bacteria, UA: NONE SEEN
Bilirubin Urine: NEGATIVE
Glucose, UA: NEGATIVE mg/dL
Hgb urine dipstick: NEGATIVE
Ketones, ur: NEGATIVE mg/dL
Leukocytes,Ua: NEGATIVE
Nitrite: NEGATIVE
Protein, ur: NEGATIVE mg/dL
Specific Gravity, Urine: 1.021 (ref 1.005–1.030)
Squamous Epithelial / HPF: NONE SEEN /HPF (ref 0–5)
pH: 5 (ref 5.0–8.0)

## 2023-07-14 LAB — LACTIC ACID, PLASMA
Lactic Acid, Venous: 2.1 mmol/L (ref 0.5–1.9)
Lactic Acid, Venous: 2.3 mmol/L (ref 0.5–1.9)

## 2023-07-14 LAB — GLUCOSE, CAPILLARY
Glucose-Capillary: 148 mg/dL — ABNORMAL HIGH (ref 70–99)
Glucose-Capillary: 166 mg/dL — ABNORMAL HIGH (ref 70–99)

## 2023-07-14 LAB — SARS CORONAVIRUS 2 BY RT PCR: SARS Coronavirus 2 by RT PCR: NEGATIVE

## 2023-07-14 LAB — CBG MONITORING, ED
Glucose-Capillary: 114 mg/dL — ABNORMAL HIGH (ref 70–99)
Glucose-Capillary: 149 mg/dL — ABNORMAL HIGH (ref 70–99)

## 2023-07-14 LAB — PROTIME-INR
INR: 1.5 — ABNORMAL HIGH (ref 0.8–1.2)
INR: 1.5 — ABNORMAL HIGH (ref 0.8–1.2)
Prothrombin Time: 18.7 seconds — ABNORMAL HIGH (ref 11.4–15.2)
Prothrombin Time: 18.7 seconds — ABNORMAL HIGH (ref 11.4–15.2)

## 2023-07-14 LAB — MRSA NEXT GEN BY PCR, NASAL: MRSA by PCR Next Gen: NOT DETECTED

## 2023-07-14 SURGERY — CREATION, CRANIAL BURR HOLE
Anesthesia: General | Laterality: Bilateral

## 2023-07-14 MED ORDER — FENTANYL CITRATE (PF) 100 MCG/2ML IJ SOLN: 25.0000 ug | INTRAMUSCULAR | Status: DC | PRN

## 2023-07-14 MED ORDER — PHENYLEPHRINE HCL-NACL 20-0.9 MG/250ML-% IV SOLN
INTRAVENOUS | Status: DC | PRN
Start: 2023-07-14 — End: 2023-07-14
  Administered 2023-07-14: 20 ug/min via INTRAVENOUS

## 2023-07-14 MED ORDER — DEXAMETHASONE SODIUM PHOSPHATE 10 MG/ML IJ SOLN
INTRAMUSCULAR | Status: DC | PRN
  Administered 2023-07-14: 10 mg via INTRAVENOUS

## 2023-07-14 MED ORDER — SUCCINYLCHOLINE CHLORIDE 200 MG/10ML IV SOSY
PREFILLED_SYRINGE | INTRAVENOUS | Status: DC | PRN
  Administered 2023-07-14: 100 mg via INTRAVENOUS

## 2023-07-14 MED ORDER — ONDANSETRON HCL 4 MG/2ML IJ SOLN: 4.0000 mg | Freq: Once | INTRAMUSCULAR | Status: DC | PRN

## 2023-07-14 MED ORDER — FAMOTIDINE IN NACL 20-0.9 MG/50ML-% IV SOLN: 20.0000 mg | INTRAVENOUS | Status: DC

## 2023-07-14 MED ORDER — SODIUM CHLORIDE 0.9 % IV SOLN
10.0000 mL/h | Freq: Once | INTRAVENOUS | Status: AC
  Administered 2023-07-21: 10 mL/h via INTRAVENOUS

## 2023-07-14 MED ORDER — FENTANYL CITRATE (PF) 100 MCG/2ML IJ SOLN
INTRAMUSCULAR | Status: DC | PRN
  Administered 2023-07-14: 25 ug via INTRAVENOUS

## 2023-07-14 MED ORDER — PROPOFOL 10 MG/ML IV BOLUS
INTRAVENOUS | Status: DC | PRN
Start: 2023-07-14 — End: 2023-07-14
  Administered 2023-07-14: 100 mg via INTRAVENOUS

## 2023-07-14 MED ORDER — SODIUM CHLORIDE 0.9 % IV SOLN: INTRAVENOUS | Status: DC | PRN

## 2023-07-14 MED ORDER — THROMBIN 5000 UNITS EX SOLR
CUTANEOUS | Status: AC
  Filled 2023-07-14: qty 5000

## 2023-07-14 MED ORDER — FENTANYL CITRATE (PF) 100 MCG/2ML IJ SOLN
INTRAMUSCULAR | Status: AC
  Filled 2023-07-14: qty 2

## 2023-07-14 MED ORDER — POLYETHYLENE GLYCOL 3350 17 G PO PACK: 17.0000 g | PACK | Freq: Every day | ORAL | Status: DC | PRN

## 2023-07-14 MED ORDER — EPHEDRINE SULFATE (PRESSORS) 50 MG/ML IJ SOLN
INTRAMUSCULAR | Status: DC | PRN
  Administered 2023-07-14: 5 mg via INTRAVENOUS

## 2023-07-14 MED ORDER — LACTATED RINGERS IV SOLN: INTRAVENOUS | Status: DC | PRN

## 2023-07-14 MED ORDER — PHENYLEPHRINE 80 MCG/ML (10ML) SYRINGE FOR IV PUSH (FOR BLOOD PRESSURE SUPPORT)
PREFILLED_SYRINGE | INTRAVENOUS | Status: DC | PRN
  Administered 2023-07-14 (×3): 160 ug via INTRAVENOUS

## 2023-07-14 MED ORDER — LIDOCAINE-EPINEPHRINE 1 %-1:100000 IJ SOLN
INTRAMUSCULAR | Status: AC
  Filled 2023-07-14: qty 1

## 2023-07-14 MED ORDER — LIDOCAINE-EPINEPHRINE 1 %-1:100000 IJ SOLN
INTRAMUSCULAR | Status: DC | PRN
  Administered 2023-07-14: 10 mL

## 2023-07-14 MED ORDER — 0.9 % SODIUM CHLORIDE (POUR BTL) OPTIME
TOPICAL | Status: DC | PRN
  Administered 2023-07-14: 1000 mL
  Administered 2023-07-14: 500 mL

## 2023-07-14 MED ORDER — SURGIFLO WITH THROMBIN (HEMOSTATIC MATRIX KIT) OPTIME
TOPICAL | Status: DC | PRN
  Administered 2023-07-14: 1 via TOPICAL

## 2023-07-14 MED ORDER — CHLORHEXIDINE GLUCONATE CLOTH 2 % EX PADS
6.0000 | MEDICATED_PAD | Freq: Every day | CUTANEOUS | Status: DC
  Administered 2023-07-14 – 2023-08-01 (×20): 6 via TOPICAL

## 2023-07-14 MED ORDER — HYDRALAZINE HCL 20 MG/ML IJ SOLN
10.0000 mg | INTRAMUSCULAR | Status: DC | PRN
  Administered 2023-07-23: 10 mg via INTRAVENOUS
  Filled 2023-07-14 (×2): qty 1

## 2023-07-14 MED ORDER — ONDANSETRON HCL 4 MG/2ML IJ SOLN
INTRAMUSCULAR | Status: DC | PRN
  Administered 2023-07-14 (×2): 4 mg via INTRAVENOUS

## 2023-07-14 MED ORDER — GLYCOPYRROLATE 0.2 MG/ML IJ SOLN
INTRAMUSCULAR | Status: DC | PRN
  Administered 2023-07-14: .2 mg via INTRAVENOUS

## 2023-07-14 MED ORDER — REMIFENTANIL HCL 1 MG IV SOLR
INTRAVENOUS | Status: AC
  Filled 2023-07-14: qty 1000

## 2023-07-14 MED ORDER — PANTOPRAZOLE SODIUM 40 MG IV SOLR: 40.0000 mg | INTRAVENOUS | Status: DC

## 2023-07-14 MED ORDER — PROPOFOL 10 MG/ML IV BOLUS
INTRAVENOUS | Status: AC
  Filled 2023-07-14: qty 20

## 2023-07-14 MED ORDER — REMIFENTANIL HCL 1 MG IV SOLR
INTRAVENOUS | Status: DC | PRN
  Administered 2023-07-14: .15 ug/kg/min via INTRAVENOUS

## 2023-07-14 MED ORDER — PANTOPRAZOLE SODIUM 40 MG IV SOLR
40.0000 mg | Freq: Two times a day (BID) | INTRAVENOUS | Status: DC
  Administered 2023-07-15 – 2023-07-18 (×8): 40 mg via INTRAVENOUS
  Filled 2023-07-14 (×8): qty 10

## 2023-07-14 MED ORDER — LABETALOL HCL 5 MG/ML IV SOLN: 10.0000 mg | INTRAVENOUS | Status: DC | PRN

## 2023-07-14 MED ORDER — INSULIN ASPART 100 UNIT/ML IJ SOLN
0.0000 [IU] | INTRAMUSCULAR | Status: DC
  Administered 2023-07-15 – 2023-07-17 (×4): 1 [IU] via SUBCUTANEOUS
  Filled 2023-07-14 (×4): qty 1

## 2023-07-14 MED ORDER — SODIUM CHLORIDE (PF) 0.9 % IJ SOLN
INTRAMUSCULAR | Status: AC
  Filled 2023-07-14: qty 50

## 2023-07-14 MED ORDER — LIDOCAINE HCL (CARDIAC) PF 100 MG/5ML IV SOSY
PREFILLED_SYRINGE | INTRAVENOUS | Status: DC | PRN
  Administered 2023-07-14: 100 mg via INTRAVENOUS

## 2023-07-14 MED ORDER — CEFAZOLIN SODIUM-DEXTROSE 2-3 GM-%(50ML) IV SOLR
INTRAVENOUS | Status: DC | PRN
  Administered 2023-07-14: 2 g via INTRAVENOUS

## 2023-07-14 MED ORDER — GELATIN ABSORBABLE 100 CM EX MISC
CUTANEOUS | Status: AC
  Filled 2023-07-14: qty 1

## 2023-07-14 MED ORDER — SODIUM CHLORIDE 0.9% IV SOLUTION: Freq: Once | INTRAVENOUS | Status: AC

## 2023-07-14 MED ORDER — ONDANSETRON HCL 4 MG/2ML IJ SOLN
4.0000 mg | Freq: Once | INTRAMUSCULAR | Status: AC
  Administered 2023-07-14: 4 mg via INTRAVENOUS
  Filled 2023-07-14: qty 2

## 2023-07-14 MED ORDER — SODIUM CHLORIDE 0.9 % IV SOLN
10.0000 mL/h | Freq: Once | INTRAVENOUS | Status: AC
  Administered 2023-07-24: 10 mL/h via INTRAVENOUS

## 2023-07-14 MED ORDER — SODIUM CHLORIDE 0.9 % IV BOLUS
1000.0000 mL | Freq: Once | INTRAVENOUS | Status: AC
  Administered 2023-07-14: 1000 mL via INTRAVENOUS

## 2023-07-14 MED ORDER — VASOPRESSIN 20 UNIT/ML IV SOLN
INTRAVENOUS | Status: DC | PRN
  Administered 2023-07-14 (×2): 2 [IU] via INTRAVENOUS

## 2023-07-14 SURGICAL SUPPLY — 89 items
AGENT HMST KT MTR STRL THRMB (HEMOSTASIS) ×2
APL PRP STRL LF ISPRP CHG 10.5 (MISCELLANEOUS) ×4
APL SKNCLS STERI-STRIP NONHPOA (GAUZE/BANDAGES/DRESSINGS)
APPLICATOR CHLORAPREP 10.5 ORG (MISCELLANEOUS) ×4 IMPLANT
BASIN GRAD PLASTIC 32OZ STRL (MISCELLANEOUS) ×2 IMPLANT
BENZOIN TINCTURE PRP APPL 2/3 (GAUZE/BANDAGES/DRESSINGS) IMPLANT
BLADE CLIPPER SPEC (BLADE) ×1 IMPLANT
BLADE SURG 15 STRL LF DISP TIS (BLADE) ×2 IMPLANT
BLADE SURG 15 STRL SS (BLADE) ×2
BULB RESERV EVAC DRAIN JP 100C (MISCELLANEOUS) IMPLANT
BUR ACORN 7.5 PRECISION (BURR) ×2 IMPLANT
BUR SPIRAL ROUTER 2.3 (BUR) ×2 IMPLANT
CATH VENTRIC 35X38 W/TROCAR LG (CATHETERS) ×2 IMPLANT
CNTNR URN SCR LID CUP LEK RST (MISCELLANEOUS) ×6 IMPLANT
CONT SPEC 4OZ STRL OR WHT (MISCELLANEOUS) ×6
COUNTER NEEDLE 20/40 LG (NEEDLE) ×2 IMPLANT
DRAIN CHANNEL JP 10F RND 20C F (MISCELLANEOUS) IMPLANT
DRAIN JP 10F RND SILICONE (MISCELLANEOUS) IMPLANT
DRAPE INCISE 23X17 STRL (DRAPES) ×2 IMPLANT
DRAPE INCISE IOBAN 23X17 STRL (DRAPES) IMPLANT
DRAPE INCISE IOBAN 66X45 STRL (DRAPES) ×1 IMPLANT
DRAPE SURG 17X11 SM STRL (DRAPES) ×8 IMPLANT
DRAPE WARM FLUID 44X44 (DRAPES) ×1 IMPLANT
DRSG TEGADERM 4X4.75 (GAUZE/BANDAGES/DRESSINGS) ×2 IMPLANT
DRSG TELFA 3X4 N-ADH STERILE (GAUZE/BANDAGES/DRESSINGS) ×2 IMPLANT
DRSG TELFA 4X3 1S NADH ST (GAUZE/BANDAGES/DRESSINGS) ×2 IMPLANT
ELECT CAUTERY BLADE TIP 2.5 (TIP) ×2
ELECT REM PT RETURN 9FT ADLT (ELECTROSURGICAL) ×2
ELECTRODE CAUTERY BLDE TIP 2.5 (TIP) ×2 IMPLANT
ELECTRODE REM PT RTRN 9FT ADLT (ELECTROSURGICAL) ×2 IMPLANT
GAUZE 4X4 16PLY ~~LOC~~+RFID DBL (SPONGE) ×3 IMPLANT
GAUZE SPONGE 4X4 12PLY STRL (GAUZE/BANDAGES/DRESSINGS) IMPLANT
GAUZE XEROFORM 1X8 LF (GAUZE/BANDAGES/DRESSINGS) ×2 IMPLANT
GLOVE BIOGEL PI IND STRL 6.5 (GLOVE) ×2 IMPLANT
GLOVE BIOGEL PI IND STRL 8.5 (GLOVE) ×3 IMPLANT
GLOVE SURG SYN 6.5 ES PF (GLOVE) ×2 IMPLANT
GLOVE SURG SYN 6.5 PF PI (GLOVE) ×2 IMPLANT
GLOVE SURG SYN 8.5 E (GLOVE) ×6 IMPLANT
GLOVE SURG SYN 8.5 PF PI (GLOVE) ×9 IMPLANT
GOWN SRG LRG LVL 4 IMPRV REINF (GOWNS) ×2 IMPLANT
GOWN SRG XL LVL 3 NONREINFORCE (GOWNS) ×2 IMPLANT
GOWN STRL NON-REIN TWL XL LVL3 (GOWNS) ×2
GOWN STRL REIN LRG LVL4 (GOWNS) ×2
GRADUATE 1200CC STRL 31836 (MISCELLANEOUS) ×2 IMPLANT
HEMOSTAT SURGICEL 2X14 (HEMOSTASIS) IMPLANT
HEMOSTAT SURGICEL 2X3 (HEMOSTASIS) IMPLANT
HEMOSTAT SURGICEL 4X8 (HEMOSTASIS) IMPLANT
HOLDER FOLEY CATH W/STRAP (MISCELLANEOUS) ×1 IMPLANT
HOOK STAY BLUNT/RETRACTOR 5M (MISCELLANEOUS) ×1 IMPLANT
KIT DRAIN CSF ACCUDRAIN (MISCELLANEOUS) ×3 IMPLANT
KIT TURNOVER KIT A (KITS) ×2 IMPLANT
MANIFOLD NEPTUNE II (INSTRUMENTS) ×2 IMPLANT
MARKER SKIN DUAL TIP RULER LAB (MISCELLANEOUS) ×4 IMPLANT
MAT ABSORB FLUID 56X50 GRAY (MISCELLANEOUS) ×2 IMPLANT
NDL HYPO 22X1.5 SAFETY MO (MISCELLANEOUS) ×1 IMPLANT
NDL HYPO 25GX1X1/2 BEV (NEEDLE) ×1 IMPLANT
NEEDLE HYPO 22X1.5 SAFETY MO (MISCELLANEOUS) ×2 IMPLANT
NEEDLE HYPO 25GX1X1/2 BEV (NEEDLE) ×2 IMPLANT
PACK CRANIOTOMY CUSTOM (CUSTOM PROCEDURE TRAY) ×2 IMPLANT
PACK LAMINECTOMY ARMC (PACKS) ×2 IMPLANT
PAD ARMBOARD 7.5X6 YLW CONV (MISCELLANEOUS) ×3 IMPLANT
PIN MAYFIELD SKULL DISP (PIN) IMPLANT
SCRUB TECHNI CARE 4 OZ NO DYE (MISCELLANEOUS) ×1 IMPLANT
SET CATH VENT DRAIN 3-15 1.9D (DRAIN) ×2 IMPLANT
SHEET NEURO XL SOL CTL (MISCELLANEOUS) ×2 IMPLANT
SOL PREP PVP 2OZ (MISCELLANEOUS)
SOL SCRUB PVP POV-IOD 4OZ 7.5% (MISCELLANEOUS)
SOLUTION PREP PVP 2OZ (MISCELLANEOUS) ×1 IMPLANT
SOLUTION SCRB POV-IOD 4OZ 7.5% (MISCELLANEOUS) ×1 IMPLANT
STAPLER SKIN PROX 35W (STAPLE) ×4 IMPLANT
STRIP CLOSURE SKIN 1/2X4 (GAUZE/BANDAGES/DRESSINGS) ×2 IMPLANT
SURGIFLO W/THROMBIN 8M KIT (HEMOSTASIS) ×3 IMPLANT
SURGILUBE 2OZ TUBE FLIPTOP (MISCELLANEOUS) ×1 IMPLANT
SUT MNCRL AB 3-0 PS2 27 (SUTURE) ×3 IMPLANT
SUT NURALON 4 0 TR CR/8 (SUTURE) ×2 IMPLANT
SUT SILK 2 0 SH CR/8 (SUTURE) ×2 IMPLANT
SUT VIC AB 2-0 CT1 18 (SUTURE) ×3 IMPLANT
SUT VIC AB 3-0 SH 8-18 (SUTURE) ×1 IMPLANT
SYR 10ML LL (SYRINGE) ×2 IMPLANT
SYR 20ML LL LF (SYRINGE) ×4 IMPLANT
TAPE CLOTH 3X10 WHT NS LF (GAUZE/BANDAGES/DRESSINGS) ×2 IMPLANT
TAPE HY-TAPE .5X5Y PINK LF (GAUZE/BANDAGES/DRESSINGS) ×1 IMPLANT
TOWEL OR 17X26 4PK STRL BLUE (TOWEL DISPOSABLE) ×8 IMPLANT
TRAP FLUID SMOKE EVACUATOR (MISCELLANEOUS) ×2 IMPLANT
TRAY FOLEY SLVR 16FR LF STAT (SET/KITS/TRAYS/PACK) IMPLANT
TRAY FOLEY SLVR 16FR TEMP STAT (SET/KITS/TRAYS/PACK) ×1 IMPLANT
TUBING ART PRESS 48 MALE/FEM (TUBING) IMPLANT
WATER STERILE IRR 1000ML POUR (IV SOLUTION) ×4 IMPLANT
WATER STERILE IRR 500ML POUR (IV SOLUTION) ×2 IMPLANT

## 2023-07-14 NOTE — H&P (View-Only) (Signed)
Consult requested by:  [name]   Consult requested for:  Subdural hematoma  Primary Physician:  [facility]  History of Present Illness: 07/14/2023 [name] is here today with a chief complaint of altered mental status.  Per his wife, he has had terrible headaches for 4-5 days. They have been unremitting.  Last night he had some confusion, and had trouble getting back into bed.    He has known history of diabetes, hypertension, and cirrhosis.  He has thrombocytopenia.  He had low blood pressure on presentation and was given IV fluids.  Workup was initiated which showed evidence of subdural hematomas.  The patient is unable to give me a history.  I spoke with his brother and with his wife to obtain history.   The symptoms are causing a significant impact on the patient's life.   I have utilized the care everywhere function in epic to review the outside records available from external health systems.  Review of Systems:  Unobtainable  Past Medical History: Past Medical History:  Diagnosis Date   Arthritis    Bleeding ulcer    Cancer (HCC)    melanoma, carcinoma   Cirrhosis of liver (HCC)    Diabetes mellitus without complication (HCC)    Heart murmur    Hypertension    Iron deficiency anemia    NASH (nonalcoholic steatohepatitis)    Sleep apnea    Spleen enlarged    Thrombocytopenia (HCC)     Past Surgical History: Past Surgical History:  Procedure Laterality Date   CHOLECYSTECTOMY     COLONOSCOPY N/A 07/23/2019   Procedure: COLONOSCOPY;  Surgeon: Toney Reil, MD;  Location: [facility] ENDOSCOPY;  Service: Gastroenterology;  Laterality: N/A;   ESOPHAGOGASTRODUODENOSCOPY N/A 07/20/2019   Procedure: ESOPHAGOGASTRODUODENOSCOPY (EGD);  Surgeon: Toney Reil, MD;  Location: [facility] ENDOSCOPY;  Service: Gastroenterology;  Laterality: N/A;   ESOPHAGOGASTRODUODENOSCOPY Left 08/20/2019   Procedure: ESOPHAGOGASTRODUODENOSCOPY (EGD);  Surgeon:  Pasty Spillers, MD;  Location: [facility] ENDOSCOPY;  Service: Endoscopy;  Laterality: Left;   ESOPHAGOGASTRODUODENOSCOPY (EGD) WITH PROPOFOL N/A 04/25/2021   Procedure: ESOPHAGOGASTRODUODENOSCOPY (EGD) WITH PROPOFOL;  Surgeon: Toney Reil, MD;  Location: [facility] ENDOSCOPY;  Service: Gastroenterology;  Laterality: N/A;   MECHANICAL AORTIC VALVE REPLACEMENT     SKIN BIOPSY     TEE WITHOUT CARDIOVERSION N/A 12/23/2018   Procedure: TRANSESOPHAGEAL ECHOCARDIOGRAM (TEE);  Surgeon: Dalia Heading, MD;  Location: [facility] ORS;  Service: Cardiovascular;  Laterality: N/A;   TEE WITHOUT CARDIOVERSION N/A 05/21/2022   Procedure: TRANSESOPHAGEAL ECHOCARDIOGRAM (TEE);  Surgeon: Antonieta Iba, MD;  Location: [facility] ORS;  Service: Cardiovascular;  Laterality: N/A;   TEE WITHOUT CARDIOVERSION N/A 04/09/2023   Procedure: TRANSESOPHAGEAL ECHOCARDIOGRAM;  Surgeon: Debbe Odea, MD;  Location: [facility] ORS;  Service: Cardiovascular;  Laterality: N/A;    Allergies: Allergies as of 07/14/2023 - Review Complete 07/14/2023  Allergen Reaction Noted   Nsaids  07/28/2019   Camphor Rash and Swelling 11/17/2002   Nickel Rash 11/17/2012   Sulfur Rash and Swelling 11/17/2002   Sulfamethoxazole Swelling 09/16/2012   Latex Rash 09/16/2012   Lisinopril Hives 09/09/2017    Medications: Current Meds  Medication Sig   ASPERCREME LIDOCAINE 4 % LIQD Apply 1 Application topically daily as needed.   atorvastatin (LIPITOR) 20 MG tablet Take 20 mg by mouth at bedtime.   augmented betamethasone dipropionate (DIPROLENE-AF) 0.05 % cream Apply 1 application topically 2 (two) times daily.   Cholecalciferol 50 MCG (2000 UT) TABS Take 1 tablet by  mouth daily.   clotrimazole (LOTRIMIN) 1 % external solution Apply 1 Application topically 2 (two) times daily. Apply to toe nails   famotidine (PEPCID) 20 MG tablet Take 20 mg by mouth daily.   folic acid (FOLVITE) 1 MG tablet Take 1 tablet by mouth daily.   insulin aspart (NOVOLOG)  100 UNIT/ML injection Inject 10 Units into the skin 3 (three) times daily before meals.   insulin glargine (LANTUS) 100 UNIT/ML injection Inject 0.14 mLs (14 Units total) into the skin daily. (Patient taking differently: Inject 18 Units into the skin daily.)   ketotifen (ZADITOR) 0.025 % ophthalmic solution Place 1 drop into both eyes 2 (two) times daily.   Lactobacillus Rhamnosus, GG, (RA PROBIOTIC DIGESTIVE CARE) CAPS Take 1 capsule by mouth daily.   lactulose (CHRONULAC) 10 GM/15ML solution Take 45 mLs (30 g total) by mouth 3 (three) times daily. (Patient taking differently: Take 30-45 mLs by mouth at bedtime. 45 ml @0800 /1400. 30 ml QHS)   levothyroxine (SYNTHROID) 100 MCG tablet Take 100 mcg by mouth daily before breakfast.   lidocaine (LIDODERM) 5 % Place 2 patches onto the skin daily. Remove & Discard patch within 12 hours or as directed by MD   magnesium oxide (MAG-OX) 400 MG tablet Take 2 tablets (800 mg total) by mouth 2 (two) times daily.   Melatonin 10 MG TABS Take 10 mg by mouth at bedtime.   Omega-3 Fatty Acids (FISH OIL) 1000 MG CAPS Take 1 capsule by mouth 2 (two) times daily.   pantoprazole (PROTONIX) 40 MG tablet Take 1 tablet (40 mg total) by mouth 2 (two) times daily.   rifaximin (XIFAXAN) 550 MG TABS tablet Take 550 mg by mouth 2 (two) times daily. Takes with lactulose   terazosin (HYTRIN) 5 MG capsule Take 5 mg by mouth at bedtime.   trolamine salicylate (ASPERCREME) 10 % cream Apply topically as needed for muscle pain.   vitamin C (ASCORBIC ACID) 500 MG tablet Take 1,000 mg by mouth daily.   zinc oxide 20 % ointment Apply 1 Application topically in the morning and at bedtime. APPLY SMALL AMOUNT TO AFFECTED AREA TWICE A DAY FOR BUTTOCK RASH FOR BUTTOCK RASH    Social History: Social History   Tobacco Use   Smoking status: Former    Current packs/day: 0.00    Average packs/day: 0.5 packs/day for 5.0 years (2.5 ttl pk-yrs)    Types: Cigarettes    Start date: 10/17/1963     Quit date: 10/16/1968    Years since quitting: 54.7   Smokeless tobacco: Never  Vaping Use   Vaping status: Never Used  Substance Use Topics   Alcohol use: Not Currently   Drug use: Not Currently    Family Medical History: Family History  Problem Relation Age of Onset   Heart attack Brother     Physical Examination: Vitals:   07/14/23 1230 07/14/23 1322  BP: 133/68   Pulse: (!) 37   Resp: 12   Temp:  98.1 F (36.7 C)  SpO2: 94%     General: Patient is sleepy and has trouble maintaining conversation.  Neck:   Supple.    Respiratory: Patient is breathing without any difficulty.   NEUROLOGICAL:     Opens eyes to stimulation.  Answers simple questions.  Oriented to self, hospital, in July 2024.  His speech is clear, but sparse.  Cranial Nerves: Pupils equal round and reactive to light.  Facial tone is symmetric.  Facial sensation is symmetric. Shoulder shrug is  symmetric. Tongue protrusion is midline.  He has left-sided pronator drift  Strength:  He does not cooperate with full strength examination, but has grossly 5 out of 5 strength on the right side with 4+ out of 5 strength in his left upper extremity and 5 out of 5 strength in his left hip flexor.  Sensory examination is unreliable.  Reflexes are 1+.  Gait is untested.     Medical Decision Making  Imaging: CT Head 07/14/2023 FINDINGS: Brain: There are acute subdural hematomas bilaterally with 1.2 cm leftward midline shift. The subdural hematoma along the right cerebral convexity measures up to 1.7 cm. The subdural hematoma along the left cerebral convexity measures up to 7 mm. There is effacement of the sulci bilaterally with likely bilateral uncal herniation. No hydrocephalus.   Vascular: No hyperdense vessel or unexpected calcification.   Skull: Normal. Negative for fracture or focal lesion.   Sinuses/Orbits: No acute finding.   Other: None.   IMPRESSION: Acute bilateral subdural hematomas with  1.2 cm leftward midline shift and likely early bilateral uncal herniation.   Findings were discussed with Dr. [name] on 07/14/23 at 1:05 PM.     Electronically Signed   By: Lorenza Cambridge M.D.   On: 07/14/2023 13:06  I have personally reviewed the images and agree with the above interpretation.  Labs reviewed.  Pertinent for thrombocytopenia and elevated INR.  Assessment and Plan: Mr. Tornes is a pleasant 77 y.o. male with bilateral subdural hematomas of mixed density.  He has altered mental status due to brain compression from the subdural hematoma.  He is coagulopathic with elevated INR as well as thrombocytopenia.  He has liver failure.  He is currently symptomatic from the subdural hematomas.  I have recommended consideration of surgical intervention with a right-sided bur hole versus mini craniotomy and left-sided bur hole versus mini craniotomy.  I discussed this with his wife and with his brother.  I relayed that he was extremely high risk for postoperative complication.  Even with correction of his coagulation parameters with platelets and Kcentra or FFP, he may suffer from complications or rebleeding.   I discussed the risks, benefits, alternatives, and indications. The risks discussed include but are not limited to bleeding, infection, need for reoperation, spinal fluid leak, stroke, vision loss, anesthetic complication, coma, paralysis, and even death. I also described in detail that improvement was not guaranteed.  The patient's wife expressed understanding of these risks, and asked that we proceed with surgery. I described the surgery in layman's terms, and gave ample opportunity for questions, which were answered to the best of my ability.  She is aware that he is at heightened risk of morbidity and mortality.  Once his blood parameters are optimized, we will consider proceeding to the operating room.  He will need to be admitted to the intensive care unit  postoperatively.  I have communicated my recommendations to the requesting physician and coordinated care to facilitate these recommendations.      K. Myer Haff MD, [facility] Neurosurgery

## 2023-07-14 NOTE — Anesthesia Procedure Notes (Signed)
Procedure Name: Intubation Date/Time: 07/14/2023 5:29 PM  Performed by: Mohammed Kindle, CRNAPre-anesthesia Checklist: Patient identified, Emergency Drugs available, Suction available and Patient being monitored Patient Re-evaluated:Patient Re-evaluated prior to induction Oxygen Delivery Method: Circle system utilized Preoxygenation: Pre-oxygenation with 100% oxygen Induction Type: IV induction, Rapid sequence and Cricoid Pressure applied Laryngoscope Size: McGraph and 3 Grade View: Grade I Tube type: Oral Tube size: 7.5 mm Number of attempts: 1 Airway Equipment and Method: Stylet and Oral airway Placement Confirmation: ETT inserted through vocal cords under direct vision, positive ETCO2 and breath sounds checked- equal and bilateral Secured at: 23 cm Tube secured with: Tape Dental Injury: Teeth and Oropharynx as per pre-operative assessment

## 2023-07-14 NOTE — Transfer of Care (Signed)
Immediate Anesthesia Transfer of Care Note  [name]  Procedure(s) Performed: Ezekiel Ina (Bilateral)  Patient Location: PACU  Anesthesia Type:General  Level of Consciousness: drowsy and patient cooperative  Airway & Oxygen Therapy: Patient Spontanous Breathing and Patient connected to face mask oxygen  Post-op Assessment: Report given to RN and Post -op Vital signs reviewed and stable  Post vital signs: Reviewed and stable  Last Vitals:  Vitals Value Taken Time  BP 133/81 07/14/23 1846  Temp 36.5 C 07/14/23 1845  Pulse 92 07/14/23 1851  Resp 12 07/14/23 1851  SpO2 100 % 07/14/23 1851  Vitals shown include unfiled device data.  Last Pain:  Vitals:   07/14/23 1646  TempSrc: Oral  PainSc:          Complications: No notable events documented.

## 2023-07-14 NOTE — ED Provider Notes (Signed)
[facility] Provider Note    Event Date/Time   First MD Initiated Contact with Patient 07/14/23 [phone]     (approximate)   History   Chief Complaint: Code Sepsis   HPI  Joshua Berry is a 77 y.o. male with a history of diabetes, hypertension, cirrhosis who comes the ED complaining of altered mental status.  Sent to the ED from a skilled nursing facility.  He was at his baseline state of health at 10:00 PM yesterday, and this morning was noted to be confused.  Has a history of sepsis as well.  No vomiting.  Patient denies pain.  Initially had hypotension with EMS, blood pressure of about 75/40.  He was given 500 mL IV fluids during transport and on arrival to the ED his blood pressure is normal.     Physical Exam   Triage Vital Signs: ED Triage Vitals  Encounter Vitals Group     BP 07/14/23 0916 (!) 127/58     Systolic BP Percentile --      Diastolic BP Percentile --      Pulse Rate 07/14/23 0914 85     Resp 07/14/23 0914 20     Temp 07/14/23 0916 98.7 F (37.1 C)     Temp Source 07/14/23 0916 Oral     SpO2 07/14/23 0914 96 %     Weight --      Height --      Head Circumference --      Peak Flow --      Pain Score 07/14/23 0909 5     Pain Loc --      Pain Education --      Exclude from Growth Chart --     Most recent vital signs: Vitals:   07/14/23 1400 07/14/23 1430  BP: (!) 148/93 133/77  Pulse: 81 (!) 54  Resp: (!) 26 15  Temp:    SpO2: 96% 95%    General: Awake, no distress.  Appears confused with decreased alertness CV:  Good peripheral perfusion.  Regular rate and rhythm Resp:  Normal effort.  Clear to auscultation bilaterally Abd:  No distention.  Soft nontender Other:  No lower extremity edema, no calf tenderness.  Dry oral mucosa   ED Results / Procedures / Treatments   Labs (all labs ordered are listed, but only abnormal results are displayed) Labs Reviewed  COMPREHENSIVE METABOLIC PANEL - Abnormal; Notable for the  following components:      Result Value   Glucose, Bld 120 (*)    Total Protein 6.2 (*)    Albumin 3.0 (*)    AST 50 (*)    Total Bilirubin 2.0 (*)    All other components within normal limits  LACTIC ACID, PLASMA - Abnormal; Notable for the following components:   Lactic Acid, Venous 2.1 (*)    All other components within normal limits  LACTIC ACID, PLASMA - Abnormal; Notable for the following components:   Lactic Acid, Venous 2.3 (*)    All other components within normal limits  CBC WITH DIFFERENTIAL/PLATELET - Abnormal; Notable for the following components:   WBC 3.4 (*)    RBC 3.49 (*)    Hemoglobin 11.8 (*)    HCT 32.1 (*)    MCHC 36.8 (*)    Platelets 43 (*)    Lymphs Abs 0.6 (*)    All other components within normal limits  PROTIME-INR - Abnormal; Notable for the following components:   Prothrombin Time  18.7 (*)    INR 1.5 (*)    All other components within normal limits  URINALYSIS, W/ REFLEX TO CULTURE (INFECTION SUSPECTED) - Abnormal; Notable for the following components:   Color, Urine AMBER (*)    APPearance CLEAR (*)    All other components within normal limits  AMMONIA - Abnormal; Notable for the following components:   Ammonia 65 (*)    All other components within normal limits  CBG MONITORING, ED - Abnormal; Notable for the following components:   Glucose-Capillary 114 (*)    All other components within normal limits  SARS CORONAVIRUS 2 BY RT PCR  CULTURE, BLOOD (ROUTINE X 2)  CULTURE, BLOOD (ROUTINE X 2)  PREPARE PLATELET PHERESIS  TYPE AND SCREEN  PREPARE FRESH FROZEN PLASMA     EKG Interpreted by me Sinus rhythm rate of 82.  Left axis, first-degree AV block .  Normal QRS ST segments and T waves.   RADIOLOGY Chest x-ray interpreted by me, unremarkable without pneumothorax edema or consolidation.  Radiology report reviewed  CT head interpreted by me, shows bilateral subdural hematoma with midline shift.  Radiology report reviewed noting early  signs of herniation.   PROCEDURES:  .Critical Care  Performed by: Sharman Cheek, MD Authorized by: Sharman Cheek, MD   Critical care provider statement:    Critical care time (minutes):  35   Critical care time was exclusive of:  Separately billable procedures and treating other patients   Critical care was necessary to treat or prevent imminent or life-threatening deterioration of the following conditions:  CNS failure or compromise   Critical care was time spent personally by me on the following activities:  Development of treatment plan with patient or surrogate, discussions with consultants, evaluation of patient's response to treatment, examination of patient, obtaining history from patient or surrogate, ordering and performing treatments and interventions, ordering and review of laboratory studies, ordering and review of radiographic studies, pulse oximetry, re-evaluation of patient's condition and review of old charts   Care discussed with: admitting provider      MEDICATIONS ORDERED IN ED: Medications  0.9 %  sodium chloride infusion (has no administration in time range)  0.9 %  sodium chloride infusion (has no administration in time range)  sodium chloride 0.9 % bolus 1,000 mL (0 mLs Intravenous Stopped 07/14/23 1047)  ondansetron (ZOFRAN) injection 4 mg (4 mg Intravenous Given 07/14/23 1406)     IMPRESSION / MDM / ASSESSMENT AND PLAN / ED COURSE  I reviewed the triage vital signs and the nursing notes.  DDx: UTI, pneumonia, COVID, dehydration, AKI, electrolyte abnormality, sepsis, hepatic encephalopathy  Patient's presentation is most consistent with acute presentation with potential threat to life or bodily function.  Patient brought to the ED due to confusion this morning, no focal findings on neuroexam.  Doubt ischemic stroke or intracranial hemorrhage.  No pain to suggest focal pathology.  I doubt sepsis but due to his age and morbidities will obtain broad lab  workup.  Possibly hepatic encephalopathy.   Clinical Course as of 07/14/23 1453  Mon Jul 14, 2023  1359 CT reveals bilateral subdural hematoma with midline shift and signs of herniation.  D/w spouse (phone), daughter(phone), and brother (bedside) who all agree that pt would be interested in surgery if appropriate.  Functional status is good, uses a walker for ambulation but otherwise performs ADLs, goes to [employer] and lives his daily life without significant limitation.  D/w NSGY, Dr. Myer Haff has arrived to bedside to  eval.  [PS]  1448 NSGY plan to OR for hematoma evacuation after transfusion of blood products to improve coagulopathy.  [PS]    Clinical Course User Index [PS] Sharman Cheek, MD      FINAL CLINICAL IMPRESSION(S) / ED DIAGNOSES   Final diagnoses:  Acute nontraumatic intracranial subdural hematoma (HCC)  Uncal herniation (HCC)     Rx / DC Orders   ED Discharge Orders     None        Note:  This document was prepared using Dragon voice recognition software and may include unintentional dictation errors.   Sharman Cheek, MD 07/14/23 [phone]

## 2023-07-14 NOTE — Consult Note (Addendum)
Consult requested by:  [name]   Consult requested for:  Subdural hematoma  Primary Physician:  [facility]  History of Present Illness: 07/14/2023 Joshua Berry is here today with a chief complaint of altered mental status.  Per his wife, he has had terrible headaches for 4-5 days. They have been unremitting.  Last night he had some confusion, and had trouble getting back into bed.    He has known history of diabetes, hypertension, and cirrhosis.  He has thrombocytopenia.  He had low blood pressure on presentation and was given IV fluids.  Workup was initiated which showed evidence of subdural hematomas.  The patient is unable to give me a history.  I spoke with his brother and with his wife to obtain history.   The symptoms are causing a significant impact on the patient's life.   I have utilized the care everywhere function in epic to review the outside records available from external health systems.  Review of Systems:  Unobtainable  Past Medical History: Past Medical History:  Diagnosis Date   Arthritis    Bleeding ulcer    Cancer (HCC)    melanoma, carcinoma   Cirrhosis of liver (HCC)    Diabetes mellitus without complication (HCC)    Heart murmur    Hypertension    Iron deficiency anemia    NASH (nonalcoholic steatohepatitis)    Sleep apnea    Spleen enlarged    Thrombocytopenia (HCC)     Past Surgical History: Past Surgical History:  Procedure Laterality Date   CHOLECYSTECTOMY     COLONOSCOPY N/A 07/23/2019   Procedure: COLONOSCOPY;  Surgeon: Toney Reil, MD;  Location: [facility] ENDOSCOPY;  Service: Gastroenterology;  Laterality: N/A;   ESOPHAGOGASTRODUODENOSCOPY N/A 07/20/2019   Procedure: ESOPHAGOGASTRODUODENOSCOPY (EGD);  Surgeon: Toney Reil, MD;  Location: [facility] ENDOSCOPY;  Service: Gastroenterology;  Laterality: N/A;   ESOPHAGOGASTRODUODENOSCOPY Left 08/20/2019   Procedure: ESOPHAGOGASTRODUODENOSCOPY (EGD);  Surgeon:  Pasty Spillers, MD;  Location: [facility] ENDOSCOPY;  Service: Endoscopy;  Laterality: Left;   ESOPHAGOGASTRODUODENOSCOPY (EGD) WITH PROPOFOL N/A 04/25/2021   Procedure: ESOPHAGOGASTRODUODENOSCOPY (EGD) WITH PROPOFOL;  Surgeon: Toney Reil, MD;  Location: [facility] ENDOSCOPY;  Service: Gastroenterology;  Laterality: N/A;   MECHANICAL AORTIC VALVE REPLACEMENT     SKIN BIOPSY     TEE WITHOUT CARDIOVERSION N/A 12/23/2018   Procedure: TRANSESOPHAGEAL ECHOCARDIOGRAM (TEE);  Surgeon: Dalia Heading, MD;  Location: [facility] ORS;  Service: Cardiovascular;  Laterality: N/A;   TEE WITHOUT CARDIOVERSION N/A 05/21/2022   Procedure: TRANSESOPHAGEAL ECHOCARDIOGRAM (TEE);  Surgeon: Antonieta Iba, MD;  Location: [facility] ORS;  Service: Cardiovascular;  Laterality: N/A;   TEE WITHOUT CARDIOVERSION N/A 04/09/2023   Procedure: TRANSESOPHAGEAL ECHOCARDIOGRAM;  Surgeon: Debbe Odea, MD;  Location: [facility] ORS;  Service: Cardiovascular;  Laterality: N/A;    Allergies: Allergies as of 07/14/2023 - Review Complete 07/14/2023  Allergen Reaction Noted   Nsaids  07/28/2019   Camphor Rash and Swelling 11/17/2002   Nickel Rash 11/17/2012   Sulfur Rash and Swelling 11/17/2002   Sulfamethoxazole Swelling 09/16/2012   Latex Rash 09/16/2012   Lisinopril Hives 09/09/2017    Medications: Current Meds  Medication Sig   ASPERCREME LIDOCAINE 4 % LIQD Apply 1 Application topically daily as needed.   atorvastatin (LIPITOR) 20 MG tablet Take 20 mg by mouth at bedtime.   augmented betamethasone dipropionate (DIPROLENE-AF) 0.05 % cream Apply 1 application topically 2 (two) times daily.   Cholecalciferol 50 MCG (2000 UT) TABS Take 1 tablet by  mouth daily.   clotrimazole (LOTRIMIN) 1 % external solution Apply 1 Application topically 2 (two) times daily. Apply to toe nails   famotidine (PEPCID) 20 MG tablet Take 20 mg by mouth daily.   folic acid (FOLVITE) 1 MG tablet Take 1 tablet by mouth daily.   insulin aspart (NOVOLOG)  100 UNIT/ML injection Inject 10 Units into the skin 3 (three) times daily before meals.   insulin glargine (LANTUS) 100 UNIT/ML injection Inject 0.14 mLs (14 Units total) into the skin daily. (Patient taking differently: Inject 18 Units into the skin daily.)   ketotifen (ZADITOR) 0.025 % ophthalmic solution Place 1 drop into both eyes 2 (two) times daily.   Lactobacillus Rhamnosus, GG, (RA PROBIOTIC DIGESTIVE CARE) CAPS Take 1 capsule by mouth daily.   lactulose (CHRONULAC) 10 GM/15ML solution Take 45 mLs (30 g total) by mouth 3 (three) times daily. (Patient taking differently: Take 30-45 mLs by mouth at bedtime. 45 ml @0800 /1400. 30 ml QHS)   levothyroxine (SYNTHROID) 100 MCG tablet Take 100 mcg by mouth daily before breakfast.   lidocaine (LIDODERM) 5 % Place 2 patches onto the skin daily. Remove & Discard patch within 12 hours or as directed by MD   magnesium oxide (MAG-OX) 400 MG tablet Take 2 tablets (800 mg total) by mouth 2 (two) times daily.   Melatonin 10 MG TABS Take 10 mg by mouth at bedtime.   Omega-3 Fatty Acids (FISH OIL) 1000 MG CAPS Take 1 capsule by mouth 2 (two) times daily.   pantoprazole (PROTONIX) 40 MG tablet Take 1 tablet (40 mg total) by mouth 2 (two) times daily.   rifaximin (XIFAXAN) 550 MG TABS tablet Take 550 mg by mouth 2 (two) times daily. Takes with lactulose   terazosin (HYTRIN) 5 MG capsule Take 5 mg by mouth at bedtime.   trolamine salicylate (ASPERCREME) 10 % cream Apply topically as needed for muscle pain.   vitamin C (ASCORBIC ACID) 500 MG tablet Take 1,000 mg by mouth daily.   zinc oxide 20 % ointment Apply 1 Application topically in the morning and at bedtime. APPLY SMALL AMOUNT TO AFFECTED AREA TWICE A DAY FOR BUTTOCK RASH FOR BUTTOCK RASH    Social History: Social History   Tobacco Use   Smoking status: Former    Current packs/day: 0.00    Average packs/day: 0.5 packs/day for 5.0 years (2.5 ttl pk-yrs)    Types: Cigarettes    Start date: 10/17/1963     Quit date: 10/16/1968    Years since quitting: 54.7   Smokeless tobacco: Never  Vaping Use   Vaping status: Never Used  Substance Use Topics   Alcohol use: Not Currently   Drug use: Not Currently    Family Medical History: Family History  Problem Relation Age of Onset   Heart attack Brother     Physical Examination: Vitals:   07/14/23 1230 07/14/23 1322  BP: 133/68   Pulse: (!) 37   Resp: 12   Temp:  98.1 F (36.7 C)  SpO2: 94%     General: Patient is sleepy and has trouble maintaining conversation.  Neck:   Supple.    Respiratory: Patient is breathing without any difficulty.   NEUROLOGICAL:     Opens eyes to stimulation.  Answers simple questions.  Oriented to self, hospital, in July 2024.  His speech is clear, but sparse.  Cranial Nerves: Pupils equal round and reactive to light.  Facial tone is symmetric.  Facial sensation is symmetric. Shoulder shrug is  symmetric. Tongue protrusion is midline.  He has left-sided pronator drift  Strength:  He does not cooperate with full strength examination, but has grossly 5 out of 5 strength on the right side with 4+ out of 5 strength in his left upper extremity and 5 out of 5 strength in his left hip flexor.  Sensory examination is unreliable.  Reflexes are 1+.  Gait is untested.     Medical Decision Making  Imaging: CT Head 07/14/2023 FINDINGS: Brain: There are acute subdural hematomas bilaterally with 1.2 cm leftward midline shift. The subdural hematoma along the right cerebral convexity measures up to 1.7 cm. The subdural hematoma along the left cerebral convexity measures up to 7 mm. There is effacement of the sulci bilaterally with likely bilateral uncal herniation. No hydrocephalus.   Vascular: No hyperdense vessel or unexpected calcification.   Skull: Normal. Negative for fracture or focal lesion.   Sinuses/Orbits: No acute finding.   Other: None.   IMPRESSION: Acute bilateral subdural hematomas with  1.2 cm leftward midline shift and likely early bilateral uncal herniation.   Findings were discussed with Dr. [name] on 07/14/23 at 1:05 PM.     Electronically Signed   By: Lorenza Cambridge M.D.   On: 07/14/2023 13:06  I have personally reviewed the images and agree with the above interpretation.  Labs reviewed.  Pertinent for thrombocytopenia and elevated INR.  Assessment and Plan: Mr. Tornes is a pleasant 77 y.o. male with bilateral subdural hematomas of mixed density.  He has altered mental status due to brain compression from the subdural hematoma.  He is coagulopathic with elevated INR as well as thrombocytopenia.  He has liver failure.  He is currently symptomatic from the subdural hematomas.  I have recommended consideration of surgical intervention with a right-sided bur hole versus mini craniotomy and left-sided bur hole versus mini craniotomy.  I discussed this with his wife and with his brother.  I relayed that he was extremely high risk for postoperative complication.  Even with correction of his coagulation parameters with platelets and Kcentra or FFP, he may suffer from complications or rebleeding.   I discussed the risks, benefits, alternatives, and indications. The risks discussed include but are not limited to bleeding, infection, need for reoperation, spinal fluid leak, stroke, vision loss, anesthetic complication, coma, paralysis, and even death. I also described in detail that improvement was not guaranteed.  The patient's wife expressed understanding of these risks, and asked that we proceed with surgery. I described the surgery in layman's terms, and gave ample opportunity for questions, which were answered to the best of my ability.  She is aware that he is at heightened risk of morbidity and mortality.  Once his blood parameters are optimized, we will consider proceeding to the operating room.  He will need to be admitted to the intensive care unit  postoperatively.  I have communicated my recommendations to the requesting physician and coordinated care to facilitate these recommendations.     Cassundra Mckeever K. Myer Haff MD, [facility] Neurosurgery

## 2023-07-14 NOTE — Op Note (Signed)
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   Findings: subdural hematoma  Preoperative Diagnosis: Subdural hematoma Postoperative Diagnosis: same   EBL: 50 ml IVF: see AR ml Drains: subdural drains placed Disposition: Extubated and Stable to PACU Complications: none  Preoperative Note:   Risks of surgery discussed include: infection, bleeding, stroke, coma, death, paralysis, CSF leak, weakness, need for further surgery, persistent symptoms, and the risks of anesthesia. The patient understood these risks and agreed to proceed.  Operative Note:   Procedure:  1) Bilateral frontal burr hole for drainage of subdural hematoma   Procedure: After obtaining informed consent, the patient taken to the operating room, placed in supine position, general anesthesia induced.  The head was placed on a horseshoe.  A linear incision was marked 5 cm lateral to the midline on each side on the coronal suture.   The operative site was prepped and draped.  A timeout was performed.    Each incision was injected with local. On the right and then the left, an incision was made and carried to the skull.  The pericranium was reflected laterally and a small self-retaining retractor placed.  The drill was used to make a burr hole.  The dura was coagulated, then divided with a #15 blade.  A membrane was encountered and opened.    Dark brown subdural fluid was encountered.  A bactiseal ventriculostomy catheter was placed into the subdural space, then tunneled medially and posteriorly approximately 5 cm from the incision.  The connector was placed and secured.  Brisk drainage was noted.  The same procedure was then performed on the opposite side. Thus, bilateral subdural drains were placed.  The primary incision was irrigated, then closed with vicryl and staples.  The drain was secured to the skin,  then a 3-0 monocryl was placed for future closure of the drainage hole, and tacked to the drain with a steristrip.  The drainage system was flushed, then attached to the drain.  Good drainage was noted.    Sterile dressings were placed.  Sponge and pattie counts were correct at the end of the procedure.   Venetia Night MD

## 2023-07-14 NOTE — ED Triage Notes (Signed)
Pt to ED [facility] from [facility] for AMS, LKW 2200.  18 Left arm Cbg 147 Wife [name] [id number]

## 2023-07-14 NOTE — Interval H&P Note (Signed)
History and Physical Interval Note:  07/14/2023 4:51 PM  Joshua Berry  has presented today for surgery, with the diagnosis of subdural hematoma.  The various methods of treatment have been discussed with the patient and family. After consideration of risks, benefits and other options for treatment, the patient's family has consented to  Procedure(s): BURR HOLES (Left) CRANIOTOMY HEMATOMA EVACUATION SUBDURAL (Right) as a surgical intervention.  The patient's history has been reviewed, patient examined, no change in status, stable for surgery.  I have reviewed the patient's chart and labs.  Questions were answered to the patient's family's satisfaction.    The patient's condition is critical.  This is a life-saving procedure.  Heart sounds normal no MRG. Chest Clear to Auscultation Bilaterally.  Natale Thoma

## 2023-07-14 NOTE — Progress Notes (Signed)
Patient assessed in PACU.  Resting comfortable.  PERRL. Not yet fully emerged from anesthesia.  Drains have put out ~50 since end of OR.  Plan:  - HOB flat - Monitor drains per orders - Neuro checks Q1 hour - CT Head in AM - Goal platelets >75k, INR <1.4

## 2023-07-14 NOTE — Progress Notes (Signed)
eLink Physician-Brief Progress Note Patient Name: KAISEI KERCHNER DOB: 26-Jan-1946 MRN: [id number]   Date of Service  07/14/2023  HPI/Events of Note  77 yo M with a history of cirrhosis and subdural hematomas presenting to [facility] ED from [place] assisted living via EMS for evaluation of altered mental status.  He was found to have acute on chronic bilateral subdural hematomas status post frontal burr holes by neurosurgery.  Admitted to the ICU for observation.  Patient initially noted to be mildly hypertensive now normotensive with 97% SpO2 with 2 L of O2 supplementation.  He is leukopenic with chronic thrombocytopenia, mild INR elevation.  Status post platelet transfusion and plasma transfusion.  eICU Interventions  Head of bed flat  Repeat CT head at 6 AM  Platelets goal greater than 75, INR less than 1.4.   SBP goal less than 150, added as needed hydralazine/labetalol  SCDs for DVT prophylaxis Home pantoprazole/Pepcid for GI prophylaxis     Intervention Category Evaluation Type: New Patient Evaluation  Riniyah Speich 07/14/2023, 10:43 PM

## 2023-07-14 NOTE — Consult Note (Signed)
NAME:  SHLOME GARRIDO, MRN:  [id number], DOB:  January 16, 1946, LOS: 0 ADMISSION DATE:  07/14/2023, CONSULTATION DATE:  07/14/23 REFERRING MD:  Dr. Marcell Barlow, CHIEF COMPLAINT:  Altered Mental Status  History of Present Illness:  77 yo M presenting to [facility] ED from [facility] assisted living via EMS for evaluation of altered mental status.  History provided per chart review and spouse interview bedside. Spouse reports the patient's baseline as A&O x 4, ambulates with a walker but otherwise performs all ADL's independently. She states that the patient was complaining of terrible headaches these past 4-5 days which have been unremitting. She has also noticed increasingly poor PO intake and fatigue. The night of 07/13/23 after eating some pizza, he vomited- non-bloody bilious- which is unusual for him. And his aide at [facility] reported some confusion, a slight weakness/dragging of his left foot as if he was unable to lift it off the ground and difficulty getting back into bed. Spouse denies any recent signs or symptoms of illness including urinary symptoms, SOB, fever/chills, abdominal pain. She also denies any falls in the last year. 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 The patient has been taking all his medication as prescribed. The lactulose evening dose was recently lowered outpatient. He does have a history of varices including esophageal. No history of tobacco use, ETOH or recreational drug use.  EMS initially reported hypotension with a BP of 75/40 and administered a 500 mL bolus in transport.  ED course: Upon arrival vitals stable with BP 124/70. Patient confused but no focal neurological findings on exam. Imaging revealed bilateral subdural hematomas with midline shift and signs of herniation. Family in agreement with surgical intervention. NSGY administered platelets and FFP to improve coagulopathy then transferred patient to OR for hematoma evacuation. Prior to  surgery patient documented as oriented x 3 with clear speech, left sided pronator drift and 4/5 strength LUE. Medications given: zofran, 1 L IVF bolus Initial Vitals: 98.7, 19, 70, 124/70 & 97% on RA Significant labs: (Labs/ Imaging personally reviewed) I, Cheryll Cockayne Rust-Chester, AGACNP-BC, personally viewed and interpreted this ECG. EKG Interpretation: Date: 07/14/23, EKG Time: 09:14, Rate: 82, Rhythm: NSR, QRS Axis: normal, Intervals: 1st degree HB, ST/T Wave abnormalities: none, Narrative Interpretation: NSR with 1st degree HB Chemistry: Na+: 138, K+: 3.8, BUN/Cr.: 14/ 1.04, Serum CO2/ AG: 22/ 9, AST: 50, Ammonia: 65 Hematology: WBC: 3.4, Hgb: 11.8, plt: 43, INR: 1.5  Lactic: 2.1 > 2.3, COVID-19: negative  CXR 07/14/23: no focal airspace opacity CT head wo contrast 07/14/23: acute bilateral subdural hematomas with 1.2 cm leftward midline shift and likely early bilateral uncal herniation. CT cervical spine wo contrast 07/14/23: no acute fracture or traumatic subluxation of the cervical spine. 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 Moderate to severe spinal canal narrowing at C5-C6.  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  Interim History / Subjective:  RASS: -1 to -2, able to follow simple commands LUE slightly weaker, non verbal > all which is unchanged post surgery. Bedside  discussion with wife who confirmed re-instatement of DNR/DNI post  procedure. Plan of care discussed and all questions answered at this time.  Objective   Blood pressure 127/75, pulse 94, temperature 97.8 F (36.6 C), resp. rate 12, height 6\' 2"  (1.88 m), weight 101 kg, SpO2 100%.        Intake/Output Summary (Last 24 hours) at 07/14/2023 1909 Last data filed at 07/14/2023 0981 Gross per 24 hour  Intake 2110 ml  Output 50 ml  Net 2060 ml   Filed Weights   07/14/23 0924  Weight: 101 kg    Examination: General: Adult male, critically ill, lying in bed, NAD HEENT: MM pink/moist, anicteric, atraumatic, neck supple Neuro: RASS: -1 to -2 but non verbal, follows simple commands, PERRL +3, MAE CV: s1s2 RRR, NSR with 1st degree HB on monitor, no r/m/g Pulm: Regular, non labored on 2 L [place], breath sounds coarse-BUL & diminished-BLL GI: soft, rounded, non tender, bs x 4 GU: foley in place with clear yellow urine Skin: no rashes/lesions noted Extremities: warm/dry, pulses + 2 R/P, no edema noted  Resolved Hospital Problem list     Assessment & Plan:  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 Goals: platelets > 75 & INR < 1.4 - Monitor for s/s of bleeding - Daily CBC > f/u STAT CBC & INR, transfuse if needed - Monitor coag panel, daily INR - Consider transfusion of platelets if < 75, consider FFP if INR < 1.4 (patient not on a blood thinner outpatient)  Cirrhosis secondary to NASH Transaminitis  PMHx: esophageal/gastric varices - will  need to restart outpatient medications once able to tolerate PO: rifaximin & lactulose - monitor hepatic function & ammonia - avoid hepatotoxic agents  OSA Was on CPAP historically but not recently - supplemental O2 PRN to maintain SPO2 > 92% - bronchodilators PRN - f/u VBG due to lethargy  Hypothyroidism - restart Synthroid once tolerating PO medications - consider IV in 3-5 days if unable to restart PO  Type 2 Diabetes Mellitus Insulin dependent - Monitor CBG Q 4 hours - SSI very sensitive dosing while NPO - target range while in ICU: 140-180 - follow ICU hyper/hypo-glycemia protocol - consider restarting long acting once tolerating a diet  GERD  - PPI BID - Pepcid daily  Dyslipidemia - consider restarting Lipitor daily once tolerated PO medications  Best Practice (right click and "Reselect all SmartList Selections" daily)  Diet/type: NPO w/ oral meds ( once RASS: 0) DVT prophylaxis: SCD GI prophylaxis: PPI Lines: N/A Foley:  Yes, and it is still needed Code Status:  DNR Last date of multidisciplinary goals of care discussion [07/14/23] Changed to FULL CODE for surgery only  Labs   CBC: Recent Labs  Lab 07/14/23 0915  WBC 3.4*  NEUTROABS 2.3  HGB 11.8*  HCT 32.1*  MCV 92.0  PLT 43*    Basic Metabolic Panel: Recent Labs  Lab 07/14/23 0915  NA 138  K 3.8  CL 107  CO2 22  GLUCOSE 120*  BUN 14  CREATININE 1.04  CALCIUM 8.9   GFR: Estimated Creatinine Clearance: 74.3 mL/min (by C-G formula based on SCr of 1.04 mg/dL). Recent Labs  Lab 07/14/23 0915 07/14/23 1230  WBC 3.4*  --   LATICACIDVEN 2.1* 2.3*    Liver Function Tests: Recent Labs  Lab 07/14/23 0915  AST 50*  ALT 37  ALKPHOS 97  BILITOT 2.0*  PROT 6.2*  ALBUMIN 3.0*   No results for input(s): "LIPASE", "AMYLASE" in the last 168 hours. Recent Labs  Lab 07/14/23 0929  AMMONIA 65*    ABG    Component Value Date/Time   PHART 7.5 (H) 05/14/2022 1734   PCO2ART 27 (L)  05/14/2022 1734   PO2ART 76 (L) 05/14/2022 1734   HCO3 21.1 05/14/2022 1734   ACIDBASEDEF 0.8 05/14/2022 1734   O2SAT 97.8 05/14/2022 1734     Coagulation Profile: Recent Labs  Lab 07/14/23 0915  INR 1.5*    Cardiac Enzymes: No results for input(s): "CKTOTAL", "CKMB", "CKMBINDEX", "TROPONINI" in the last 168 hours.  HbA1C: Hgb A1c MFr Bld  Date/Time Value Ref Range Status  06/17/2023 06:13 AM 5.4 4.8 - 5.6 % Final    Comment:    (NOTE)         Prediabetes: 5.7 - 6.4         Diabetes: >6.4         Glycemic control for adults with diabetes: <7.0   04/07/2022 05:57 AM 6.0 (H) 4.8 - 5.6 % Final    Comment:    (NOTE) Pre diabetes:          5.7%-6.4%  Diabetes:              >6.4%  Glycemic control for   <7.0% adults with diabetes     CBG: Recent Labs  Lab 07/14/23 0921 07/14/23 1850  GLUCAP 114* 149*    Review of Systems:  UTA- patient not verbal at this time. Past Medical History:  He,  has a past medical history of Arthritis, Bleeding ulcer, Cancer (HCC), Cirrhosis of liver (HCC), Diabetes mellitus without complication (HCC), Heart murmur, Hypertension, Iron deficiency anemia, NASH (nonalcoholic steatohepatitis), Sleep apnea, Spleen enlarged, and Thrombocytopenia (HCC).   Surgical History:   Past Surgical History:  Procedure Laterality Date   CHOLECYSTECTOMY     COLONOSCOPY N/A 07/23/2019   Procedure: COLONOSCOPY;  Surgeon: Toney Reil, MD;  Location: [facility] ENDOSCOPY;  Service: Gastroenterology;  Laterality: N/A;   ESOPHAGOGASTRODUODENOSCOPY N/A 07/20/2019   Procedure: ESOPHAGOGASTRODUODENOSCOPY (EGD);  Surgeon: Toney Reil, MD;  Location: [facility] ENDOSCOPY;  Service: Gastroenterology;  Laterality: N/A;   ESOPHAGOGASTRODUODENOSCOPY Left 08/20/2019   Procedure: ESOPHAGOGASTRODUODENOSCOPY (EGD);  Surgeon: Pasty Spillers, MD;  Location: [facility] ENDOSCOPY;  Service: Endoscopy;  Laterality: Left;   ESOPHAGOGASTRODUODENOSCOPY (EGD) WITH PROPOFOL N/A  04/25/2021   Procedure: ESOPHAGOGASTRODUODENOSCOPY (EGD) WITH PROPOFOL;  Surgeon: Toney Reil, MD;  Location: [facility] ENDOSCOPY;  Service: Gastroenterology;  Laterality: N/A;   MECHANICAL AORTIC VALVE REPLACEMENT     SKIN BIOPSY     TEE WITHOUT CARDIOVERSION N/A 12/23/2018   Procedure: TRANSESOPHAGEAL ECHOCARDIOGRAM (TEE);  Surgeon: Dalia Heading, MD;  Location: [facility] ORS;  Service: Cardiovascular;  Laterality: N/A;   TEE WITHOUT CARDIOVERSION N/A 05/21/2022   Procedure: TRANSESOPHAGEAL ECHOCARDIOGRAM (TEE);  Surgeon: Antonieta Iba, MD;  Location: [facility] ORS;  Service: Cardiovascular;  Laterality: N/A;   TEE WITHOUT CARDIOVERSION N/A 04/09/2023   Procedure: TRANSESOPHAGEAL ECHOCARDIOGRAM;  Surgeon: Debbe Odea, MD;  Location: [facility] ORS;  Service: Cardiovascular;  Laterality: N/A;     Social History:   reports that he quit smoking about 54 years ago. His smoking use included cigarettes. He started smoking about 59 years ago. He has a 2.5 pack-year smoking history. He has never used smokeless tobacco. He reports that he does not currently use alcohol. He reports that he does not currently use drugs.   Family History:  His family history includes Heart attack in his brother.   Allergies Allergies  Allergen Reactions   Nsaids     Other Reaction(s): Ascites   Camphor Rash and Swelling   Nickel Rash   Sulfur Rash and Swelling   Sulfamethoxazole Swelling   Latex Rash   Lisinopril Hives    Other Reaction(s): Cough     Home Medications  Prior to Admission medications   Medication Sig Start Date End Date Taking? Authorizing Provider  ASPERCREME LIDOCAINE 4 % LIQD Apply 1 Application topically daily as needed. 06/07/22  Yes [provider]  atorvastatin (LIPITOR) 20 MG tablet Take 20 mg by mouth at bedtime.   Yes [provider]  augmented betamethasone dipropionate (DIPROLENE-AF) 0.05 % cream Apply 1 application topically 2 (two) times daily.   Yes [provider]  Cholecalciferol 50 MCG (2000 UT) TABS Take 1 tablet by mouth daily.   Yes [provider]  clotrimazole (LOTRIMIN) 1 % external solution Apply 1 Application topically 2 (two) times daily. Apply to toe nails 02/12/23  Yes [provider]  famotidine (PEPCID) 20 MG tablet Take 20 mg by mouth daily.   Yes [provider]  folic acid (FOLVITE) 1 MG tablet Take 1 tablet by mouth daily. 12/03/19  Yes [provider]  insulin aspart (NOVOLOG) 100 UNIT/ML injection Inject 10 Units into the skin 3 (three) times daily before meals. 04/26/21  Yes Rhetta Mura, MD  insulin glargine (LANTUS) 100 UNIT/ML injection Inject 0.14 mLs (14 Units total) into the skin daily. Patient taking differently: Inject 18 Units into the skin daily. 08/05/22  Yes Hollice Espy, MD  ketotifen (ZADITOR) 0.025 % ophthalmic solution Place 1 drop into both eyes 2 (two) times daily.   Yes [provider]  Lactobacillus Rhamnosus, GG, (RA PROBIOTIC DIGESTIVE CARE) CAPS Take 1 capsule by mouth daily.   Yes [provider]  lactulose (CHRONULAC) 10 GM/15ML solution Take 45 mLs (30 g total) by mouth 3 (three) times daily. Patient taking differently: Take 30-45 mLs by mouth at bedtime. 45 ml @0800 /1400. 30 ml QHS 06/18/23  Yes Agbata, Tochukwu, MD  levothyroxine (SYNTHROID) 100 MCG tablet Take 100 mcg by mouth daily before breakfast.   Yes [provider]  lidocaine (LIDODERM) 5 % Place 2 patches onto the skin daily. Remove & Discard patch within 12 hours or as directed by MD 05/28/22  Yes [name], MD  magnesium oxide (MAG-OX) 400 MG tablet Take 2 tablets (800 mg total) by mouth 2 (two) times daily. 05/28/22  Yes [name], MD  Melatonin 10 MG TABS Take 10 mg by mouth at bedtime.   Yes [provider]  Omega-3 Fatty Acids (FISH OIL) 1000 MG CAPS Take 1 capsule by mouth 2 (two) times daily.   Yes [provider]  pantoprazole (PROTONIX) 40  MG tablet Take 1 tablet (40 mg total) by mouth 2 (two) times daily. 04/07/22 07/14/23 Yes Arnetha Courser, MD  rifaximin (XIFAXAN) 550 MG TABS tablet Take 550 mg by mouth 2 (two) times daily. Takes with lactulose  Yes [provider]  terazosin (HYTRIN) 5 MG capsule Take 5 mg by mouth at bedtime.   Yes [provider]  trolamine salicylate (ASPERCREME) 10 % cream Apply topically as needed for muscle pain. 05/28/22  Yes [name], MD  vitamin C (ASCORBIC ACID) 500 MG tablet Take 1,000 mg by mouth daily.   Yes [provider]  zinc oxide 20 % ointment Apply 1 Application topically in the morning and at bedtime. APPLY SMALL AMOUNT TO AFFECTED AREA TWICE A DAY FOR BUTTOCK RASH FOR BUTTOCK RASH 05/13/23  Yes [provider]  furosemide (LASIX) 20 MG tablet Take 1 tablet (20 mg total) by mouth daily. Patient taking differently: Take 20 mg by mouth daily. Only Monday, Wednesday and Friday 04/07/22   Arnetha Courser, MD  Semaglutide,0.25 or 0.5MG /DOS, 2 MG/1.5ML SOPN Inject 0.5 mg into the skin once a week. Thursday    [provider]     Critical care time: 65 minutes       Betsey Holiday, AGACNP-BC Acute Care Nurse Practitioner [facility]   [phone] / [phone] Please see Amion for details.

## 2023-07-14 NOTE — Anesthesia Preprocedure Evaluation (Signed)
Anesthesia Evaluation  Patient identified by MRN, date of birth, ID band Patient confused    Reviewed: Allergy & Precautions, H&P , NPO status , Patient's Chart, lab work & pertinent test results  History of Anesthesia Complications Negative for: history of anesthetic complications  Airway Mallampati: III  TM Distance: >3 FB Neck ROM: Full    Dental  (+) Loose, Chipped, Poor Dentition, Missing, Dental Advidsory Given   Pulmonary neg shortness of breath, sleep apnea and Continuous Positive Airway Pressure Ventilation , neg COPD, neg recent URI, former smoker   Pulmonary exam normal        Cardiovascular hypertension, Pt. on medications (-) angina +CHF  (-) CAD, (-) Past MI and (-) Cardiac Stents (-) dysrhythmias + Valvular Problems/Murmurs (s/p TAVR) AS  + Systolic murmurs Echo 4/24: 1. Left ventricular ejection fraction, by estimation, is 55 to 60%. The left ventricle has normal function.  2. Right ventricular systolic function is normal. The right ventricular size is normal.  3. No left atrial/left atrial appendage thrombus was detected.  4. The mitral valve is normal in structure. Trivial mitral valve regurgitation.  5. The aortic valve has been repaired/replaced. Aortic valve regurgitation is mild. 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  6. Aortic dilatation noted. There is mild dilatation of the aortic root, measuring 43 mm.    Neuro/Psych negative neurological ROS  negative psych ROS   GI/Hepatic ,GERD  Medicated and Controlled,,(+) Cirrhosis  (NASH)      , Hepatitis -, Toxin Related  Endo/Other  diabetes, Well Controlled, Insulin DependentHypothyroidism    Renal/GU negative Renal ROS  negative genitourinary   Musculoskeletal  (+) Arthritis , Osteoarthritis,    Abdominal   Peds  Hematology  (+) Blood dyscrasia, anemia Thrombocytopenia   Anesthesia Other Findings Past Medical  History: No date: Arthritis No date: Cancer (HCC)     Comment:  melanoma, carcinoma No date: Cirrhosis of liver (HCC) No date: Diabetes mellitus without complication (HCC) No date: Heart murmur No date: Hypertension No date: Iron deficiency anemia No date: NASH (nonalcoholic steatohepatitis) No date: Sleep apnea No date: Spleen enlarged No date: Thrombocytopenia (HCC)  Past Surgical History: No date: CHOLECYSTECTOMY 07/23/2019: COLONOSCOPY; N/A     Comment:  Procedure: COLONOSCOPY;  Surgeon: Toney Reil,               MD;  Location: [facility] ENDOSCOPY;  Service:               Gastroenterology;  Laterality: N/A; 07/20/2019: ESOPHAGOGASTRODUODENOSCOPY; N/A     Comment:  Procedure: ESOPHAGOGASTRODUODENOSCOPY (EGD);  Surgeon:               Toney Reil, MD;  Location: [facility] ENDOSCOPY;                Service: Gastroenterology;  Laterality: N/A; No date: SKIN BIOPSY 12/23/2018: TEE WITHOUT CARDIOVERSION; N/A     Comment:  Procedure: TRANSESOPHAGEAL ECHOCARDIOGRAM (TEE);                Surgeon: Dalia Heading, MD;  Location: [facility] ORS;                Service: Cardiovascular;  Laterality: N/A;  BMI    Body Mass Index: 28.87 kg/m      Reproductive/Obstetrics negative OB ROS                             Anesthesia Physical  Anesthesia Plan  ASA: 4 and emergent  Anesthesia Plan: General   Post-op Pain Management:    Induction: Intravenous, Rapid sequence and Cricoid pressure planned  PONV Risk Score and Plan: Ondansetron, Dexamethasone and Treatment may vary due to age or medical condition  Airway Management Planned: Natural Airway and Nasal Cannula  Additional Equipment:   Intra-op Plan:   Post-operative Plan:   Informed Consent: I have reviewed the patients History and Physical, chart, labs and discussed the procedure including the risks, benefits and alternatives for the proposed anesthesia with the patient or authorized representative who  has indicated his/her understanding and acceptance.     Dental Advisory Given  Plan Discussed with: Anesthesiologist and CRNA  Anesthesia Plan Comments: (Discussed risks/benefits of anesthesia with the patient's wife who is POA and signing consents.  She understood the risks and did mention that the patient had a DNR.  We discussed suspending the DNR for surgery and she agreed, but said that if something happens that is unfixable to go ahead and stop life saving measures, which I was agreeable to.)        Anesthesia Quick Evaluation

## 2023-07-15 ENCOUNTER — Inpatient Hospital Stay: Payer: No Typology Code available for payment source

## 2023-07-15 ENCOUNTER — Encounter: Payer: Self-pay | Admitting: Neurosurgery

## 2023-07-15 ENCOUNTER — Telehealth: Payer: Self-pay

## 2023-07-15 DIAGNOSIS — I6201 Nontraumatic acute subdural hemorrhage: Secondary | ICD-10-CM

## 2023-07-15 DIAGNOSIS — K7460 Unspecified cirrhosis of liver: Secondary | ICD-10-CM

## 2023-07-15 DIAGNOSIS — S065XAA Traumatic subdural hemorrhage with loss of consciousness status unknown, initial encounter: Secondary | ICD-10-CM

## 2023-07-15 LAB — PREPARE PLATELET PHERESIS
Unit division: 0
Unit division: 0
Unit division: 0
Unit division: 0

## 2023-07-15 LAB — GLUCOSE, CAPILLARY
Glucose-Capillary: 106 mg/dL — ABNORMAL HIGH (ref 70–99)
Glucose-Capillary: 112 mg/dL — ABNORMAL HIGH (ref 70–99)
Glucose-Capillary: 129 mg/dL — ABNORMAL HIGH (ref 70–99)
Glucose-Capillary: 141 mg/dL — ABNORMAL HIGH (ref 70–99)
Glucose-Capillary: 157 mg/dL — ABNORMAL HIGH (ref 70–99)
Glucose-Capillary: 97 mg/dL (ref 70–99)

## 2023-07-15 LAB — BPAM PLATELET PHERESIS
Blood Product Expiration Date: 202408012359
Blood Product Expiration Date: 202408012359
Blood Product Expiration Date: 202408012359
Blood Product Expiration Date: 202408012359
ISSUE DATE / TIME: 202407300750
ISSUE DATE / TIME: 202407300852
ISSUE DATE / TIME: 202407301100
ISSUE DATE / TIME: 202407301435
Unit Type and Rh: 5100
Unit Type and Rh: 5100
Unit Type and Rh: 5100
Unit Type and Rh: 5100

## 2023-07-15 LAB — BPAM FFP
Blood Product Expiration Date: 202408042359
ISSUE DATE / TIME: 202407301654
Unit Type and Rh: 5100

## 2023-07-15 LAB — CBC WITH DIFFERENTIAL/PLATELET
Abs Immature Granulocytes: 0.02 10*3/uL (ref 0.00–0.07)
Abs Immature Granulocytes: 0.02 10*3/uL (ref 0.00–0.07)
Basophils Absolute: 0 10*3/uL (ref 0.0–0.1)
Basophils Absolute: 0 10*3/uL (ref 0.0–0.1)
Basophils Relative: 0 %
Basophils Relative: 0 %
Eosinophils Absolute: 0 10*3/uL (ref 0.0–0.5)
Eosinophils Absolute: 0 10*3/uL (ref 0.0–0.5)
Eosinophils Relative: 0 %
Eosinophils Relative: 1 %
HCT: 27.3 % — ABNORMAL LOW (ref 39.0–52.0)
HCT: 27.4 % — ABNORMAL LOW (ref 39.0–52.0)
Hemoglobin: 10 g/dL — ABNORMAL LOW (ref 13.0–17.0)
Hemoglobin: 9.6 g/dL — ABNORMAL LOW (ref 13.0–17.0)
Immature Granulocytes: 0 %
Immature Granulocytes: 0 %
Lymphocytes Relative: 8 %
Lymphocytes Relative: 12 %
Lymphs Abs: 0.5 10*3/uL — ABNORMAL LOW (ref 0.7–4.0)
Lymphs Abs: 0.6 10*3/uL — ABNORMAL LOW (ref 0.7–4.0)
MCH: 33.6 pg (ref 26.0–34.0)
MCH: 33.1 pg (ref 26.0–34.0)
MCHC: 36.6 g/dL — ABNORMAL HIGH (ref 30.0–36.0)
MCHC: 35 g/dL (ref 30.0–36.0)
MCV: 91.6 fL (ref 80.0–100.0)
MCV: 94.5 fL (ref 80.0–100.0)
Monocytes Absolute: 0.7 10*3/uL (ref 0.1–1.0)
Monocytes Absolute: 0.6 10*3/uL (ref 0.1–1.0)
Monocytes Relative: 11 %
Monocytes Relative: 11 %
Neutro Abs: 5.2 10*3/uL (ref 1.7–7.7)
Neutro Abs: 4.1 10*3/uL (ref 1.7–7.7)
Neutrophils Relative %: 81 %
Neutrophils Relative %: 76 %
Platelets: 72 10*3/uL — ABNORMAL LOW (ref 150–400)
Platelets: 74 10*3/uL — ABNORMAL LOW (ref 150–400)
RBC: 2.98 MIL/uL — ABNORMAL LOW (ref 4.22–5.81)
RBC: 2.9 MIL/uL — ABNORMAL LOW (ref 4.22–5.81)
RDW: 14.6 % (ref 11.5–15.5)
RDW: 14.6 % (ref 11.5–15.5)
WBC: 6.4 10*3/uL (ref 4.0–10.5)
WBC: 5.4 10*3/uL (ref 4.0–10.5)
nRBC: 0 % (ref 0.0–0.2)
nRBC: 0 % (ref 0.0–0.2)

## 2023-07-15 LAB — PREPARE FRESH FROZEN PLASMA

## 2023-07-15 LAB — PROTIME-INR
INR: 1.5 — ABNORMAL HIGH (ref 0.8–1.2)
INR: 1.5 — ABNORMAL HIGH (ref 0.8–1.2)
Prothrombin Time: 18.2 seconds — ABNORMAL HIGH (ref 11.4–15.2)
Prothrombin Time: 18.3 seconds — ABNORMAL HIGH (ref 11.4–15.2)

## 2023-07-15 MED ORDER — ORAL CARE MOUTH RINSE
15.0000 mL | OROMUCOSAL | Status: DC
  Administered 2023-07-15 – 2023-08-01 (×63): 15 mL via OROMUCOSAL

## 2023-07-15 MED ORDER — MORPHINE SULFATE (PF) 2 MG/ML IV SOLN
1.5000 mg | Freq: Once | INTRAVENOUS | Status: AC
  Administered 2023-07-15: 1.5 mg via INTRAVENOUS
  Filled 2023-07-15: qty 1

## 2023-07-15 MED ORDER — SODIUM CHLORIDE 0.9% IV SOLUTION: Freq: Once | INTRAVENOUS | Status: AC

## 2023-07-15 MED ORDER — MORPHINE SULFATE (PF) 2 MG/ML IV SOLN: 1.0000 mg | Freq: Once | INTRAVENOUS | Status: AC

## 2023-07-15 MED ORDER — VITAMIN K1 10 MG/ML IJ SOLN
10.0000 mg | INTRAVENOUS | Status: AC
  Administered 2023-07-15 – 2023-07-17 (×3): 10 mg via INTRAVENOUS
  Filled 2023-07-15 (×3): qty 1

## 2023-07-15 MED ORDER — MORPHINE SULFATE (PF) 2 MG/ML IV SOLN
1.0000 mg | INTRAVENOUS | Status: DC | PRN
  Administered 2023-07-15 – 2023-07-18 (×6): 1 mg via INTRAVENOUS
  Filled 2023-07-15 (×6): qty 1

## 2023-07-15 MED ORDER — MORPHINE SULFATE (PF) 2 MG/ML IV SOLN
INTRAVENOUS | Status: AC
  Administered 2023-07-15: 1 mg via INTRAVENOUS
  Filled 2023-07-15: qty 1

## 2023-07-15 MED ORDER — ORAL CARE MOUTH RINSE
15.0000 mL | OROMUCOSAL | Status: DC | PRN
  Administered 2023-07-14 – 2023-07-15 (×5): 15 mL via OROMUCOSAL

## 2023-07-15 NOTE — Progress Notes (Signed)
PHARMACY CONSULT NOTE  Pharmacy Consult for Electrolyte Monitoring and Replacement   Recent Labs: Potassium (mmol/L)  Date Value  07/15/2023 4.2  05/01/2014 4.2   Magnesium (mg/dL)  Date Value  16/60/6301 1.9   Calcium (mg/dL)  Date Value  60/09/9322 8.7 (L)   Calcium, Total (mg/dL)  Date Value  55/73/2202 9.4   Albumin (g/dL)  Date Value  54/27/0623 2.8 (L)  05/01/2014 4.2   Phosphorus (mg/dL)  Date Value  76/28/3151 3.9   Sodium (mmol/L)  Date Value  07/15/2023 138  05/01/2014 136     Assessment: 77 y/o male with h/o NASH, cirrhosis, gastric and esophageal varices, chronic diarrhea (secondary to lactulose), HTN, DM, HLD, CHF, GERD, IDA, OSA and heart murmur s/p aortic valve who is admitted with SDH s/p bilateral frontal burr hole for drainage of subdural hematoma 7/29.   Goal of Therapy:  Electrolytes WNL  Plan:  ---no electrolyte replacement warranted for today ---recheck electrolytes in am  Lowella Bandy ,PharmD Clinical Pharmacist 07/15/2023 7:03 AM

## 2023-07-15 NOTE — Telephone Encounter (Signed)
Dr Myer Haff operated on Joshua Berry 07/14/23 (burr holes for subdural hematoma). Per Dr Myer Haff, he needs post op appts in 2 weeks and in 4-6 weeks with a repeat head CT a few days prior to his 4-6 week appointment.  Patty: Post op appts have been scheduled. I have ordered the head CT. Please make sure this is scheduled to be done prior to his 2nd post op. Also, not sure if we need a referral from the [place]? Thanks!

## 2023-07-15 NOTE — Telephone Encounter (Signed)
Noted  

## 2023-07-15 NOTE — Anesthesia Postprocedure Evaluation (Signed)
Anesthesia Post Note  [name]  Procedure(s) Performed: Ezekiel Ina (Bilateral)  Patient location during evaluation: ICU Anesthesia Type: General Respiratory status: spontaneous breathing Cardiovascular status: stable Anesthetic complications: no   No notable events documented.   Last Vitals:  Vitals:   07/15/23 0700 07/15/23 0809  BP: 137/69 (!) 127/56  Pulse: (!) 58 81  Resp: 10 16  Temp:  36.5 C  SpO2: 96%     Last Pain:  Vitals:   07/15/23 0809  TempSrc: Axillary  PainSc:                  Jaye Beagle

## 2023-07-15 NOTE — Progress Notes (Signed)
Initial Nutrition Assessment  DOCUMENTATION CODES:   Not applicable  INTERVENTION:   Ensure Enlive po BID with diet advancement, each supplement provides 350 kcal and 20 grams of protein.  MVI po daily with diet advancement  Pt at high refeed risk  Daily weights   NUTRITION DIAGNOSIS:   Inadequate oral intake related to acute illness as evidenced by per patient/family report.  GOAL:   Patient will meet greater than or equal to 90% of their needs  MONITOR:   Diet advancement, Labs, Weight trends, Skin, I & O's  REASON FOR ASSESSMENT:   Malnutrition Screening Tool    ASSESSMENT:   77 y/o male with h/o NASH, cirrhosis, gastric and esophageal varices, chronic diarrhea (secondary to lactulose), HTN, DM, HLD, CHF, GERD, IDA, OSA and heart murmur s/p aortic valve who is admitted with SDH s/p bilateral frontal burr hole for drainage of subdural hematoma 7/29.  RD working remotely.  Pt resides at [facility] Per chart review, pt's caregivers reported pt with decreased oral intake, nausea and one episode of vomiting (7/28) pta. Pt complaining of intractable headache for 4-5 days pta. Per chart, pt appears to be down ~7lbs(3%) from his last documented weight from April; RD unsure if this weight loss is significant. Pt NPO today as HOB must remain flat. RD will add supplements with diet advancement. Pt is likely at refeed risk. RD will obtain nutrition related exam and history at follow up.   Medications reviewed and include: insulin, protonix  Labs reviewed: K 4.2 wnl, P 3.9 wnl, Mg 1.9 wnl Ammonia- 65(H) Hgb 10.4(L), Hct 29.4(L) Cbgs- 141, 157, 166, , 149, 114 x 24 hrs AIC 5.4- 7/2  Right drain output  Left drain output 78.21ml   NUTRITION - FOCUSED PHYSICAL EXAM: Unable to perform at this time   Diet Order:   Diet Order             Diet NPO time specified  Diet effective now                  EDUCATION NEEDS:   No education needs have been identified at this  time  Skin:  Skin Assessment: Reviewed RN Assessment (incision head)  Last BM:  7/29  Height:   Ht Readings from Last 1 Encounters:  07/14/23 6\' 2"  (1.88 m)    Weight:   Wt Readings from Last 1 Encounters:  07/14/23 101 kg    Ideal Body Weight:  86.3 kg  BMI:  Body mass index is 28.59 kg/m.  Estimated Nutritional Needs:   Kcal:  2000-2300kcal/day  Protein:  100-115g/day  Fluid:  2.0-2.3L/day  Betsey Holiday MS, RD, LDN Please refer to [facility] for RD and/or RD on-call/weekend/after hours pager

## 2023-07-15 NOTE — Progress Notes (Signed)
Neuro PA at bedside, drains to remain in place until tomorrow per update to RN. RN to notify neuro MD immediately if either drain began to produce clear fluids (CSF).

## 2023-07-15 NOTE — Progress Notes (Signed)
MD at bedside when RN stepped away with other patient. MD sent secure chat by RN with following update:  "Patient has had a good day other than headache which he received ordered morphine. Left drain has had 1 of OP and Right drain has had 60 of OP. Neuro assessment has improved as day progressed with increased alertness and decreased delay in verbal responses"  MD acknowledged message and appreciative of update.

## 2023-07-15 NOTE — Progress Notes (Signed)
Neuro PA at bedside, updated by RN. Left EVD  no output since 0700. PA consulting with MD.

## 2023-07-15 NOTE — Progress Notes (Addendum)
NAME:  Joshua Berry, MRN:  [id number], DOB:  05/20/1946, LOS: 1 ADMISSION DATE:  07/14/2023, CONSULTATION DATE:  07/14/23 REFERRING MD:  Dr. Marcell Barlow, CHIEF COMPLAINT:  Altered Mental Status  History of Present Illness:  77 yo M presenting to [facility] ED from [facility] assisted living via EMS for evaluation of altered mental status.  History provided per chart review and spouse interview bedside. Spouse reports the patient's baseline as A&O x 4, ambulates with a walker but otherwise performs all ADL's independently. She states that the patient was complaining of terrible headaches these past 4-5 days which have been unremitting. She has also noticed increasingly poor PO intake and fatigue. The night of 07/13/23 after eating some pizza, he vomited- non-bloody bilious- which is unusual for him. And his aide at [facility] reported some confusion, a slight weakness/dragging of his left foot as if he was unable to lift it off the ground and difficulty getting back into bed. Spouse denies any recent signs or symptoms of illness including urinary symptoms, SOB, fever/chills, abdominal pain. She also denies any falls in the last year. 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 The patient has been taking all his medication as prescribed. The lactulose evening dose was recently lowered outpatient. He does have a history of varices including esophageal. No history of tobacco use, ETOH or recreational drug use.  EMS initially reported hypotension with a BP of 75/40 and administered a 500 mL bolus in transport.  ED course: Upon arrival vitals stable with BP 124/70. Patient confused but no focal neurological findings on exam. Imaging revealed bilateral subdural hematomas with midline shift and signs of herniation. Family in agreement with surgical intervention. NSGY administered platelets and FFP to improve coagulopathy then transferred patient to OR for hematoma evacuation. Prior to  surgery patient documented as oriented x 3 with clear speech, left sided pronator drift and 4/5 strength LUE. Medications given: zofran, 1 L IVF bolus Initial Vitals: 98.7, 19, 70, 124/70 & 97% on RA Significant labs: (Labs/ Imaging personally reviewed) I, Cheryll Cockayne Rust-Chester, AGACNP-BC, personally viewed and interpreted this ECG. EKG Interpretation: Date: 07/14/23, EKG Time: 09:14, Rate: 82, Rhythm: NSR, QRS Axis: normal, Intervals: 1st degree HB, ST/T Wave abnormalities: none, Narrative Interpretation: NSR with 1st degree HB Chemistry: Na+: 138, K+: 3.8, BUN/Cr.: 14/ 1.04, Serum CO2/ AG: 22/ 9, AST: 50, Ammonia: 65 Hematology: WBC: 3.4, Hgb: 11.8, plt: 43, INR: 1.5  Lactic: 2.1 > 2.3, COVID-19: negative  CXR 07/14/23: no focal airspace opacity CT head wo contrast 07/14/23: acute bilateral subdural hematomas with 1.2 cm leftward midline shift and likely early bilateral uncal herniation. CT cervical spine wo contrast 07/14/23: no acute fracture or traumatic subluxation of the cervical spine. 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 Moderate to severe spinal canal narrowing at C5-C6.  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 07/15/23: INR 1.5/platelets 58.  Pt received 2 units of FFP and pending transfusion of 2 units of platelets.  Repeat CT Head results pending  Interim History / Subjective:  No acute events overnight   Objective   Blood  pressure 137/69, pulse (!) 58, temperature 98.4 F (36.9 C), temperature source Oral, resp. rate 10, height 6\' 2"  (1.88 m), weight 101 kg, SpO2 96%.        Intake/Output Summary (Last 24 hours) at 07/15/2023 0743 Last data filed at 07/15/2023 0600 Gross per 24 hour  Intake 3343.47 ml  Output 1259.8 ml  Net 2083.67 ml   Filed Weights   07/14/23 0924  Weight: 101 kg    Examination: General: Acutely-ill appearing elderly male,  NAD HEENT: MM pink/moist, anicteric, atraumatic, neck supple Neuro: Awake, oriented to place and self only, following commands, moves all extremities, 5/5 right sided motor strength/3/5 left motor strength  CV: s1s2 RRR, NSR with 1st degree HB on monitor, no r/m/g Pulm: Diminished throughout, even, non labored  GI: +BS x4, soft, non tender, non distended  GU:  Indwelling foley catheter in place  Skin: Bilateral subdural drains present with sanguinous drainage incision site dressings intact with old dried blood present;  Extremities: warm/dry, pulses + 2 R/P, no edema noted  Resolved Hospital Problem list     Assessment & Plan:  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 Goals: platelets > 75 & INR < 1.4 - Trend CBC and coags  - Consider transfusion of platelets if < 75, consider FFP if INR < 1.4 (patient not on a blood thinner outpatient)  Cirrhosis secondary to NASH Transaminitis  PMHx: esophageal/gastric varices - Once able to tolerate po's resume outpatient rifaximin & lactulose - Trend hepatic  function & ammonia - Avoid hepatotoxic agents  OSA Was on CPAP historically but not recently - Supplemental O2 for dyspnea and/or hypoxia  - Maintain O2 sats 92% or higher  - Prn bronchodilator therapy   Hypothyroidism - Once able to take po's will resume outpatient synthroid  Type 2 Diabetes Mellitus Insulin dependent - CBG's q4 hrs - SSI very sensitive dosing while NPO - Target range while in ICU: 140-180 - Follow ICU hyper/hypo-glycemia protocol  GERD  - PPI BID  Dyslipidemia - Resume outpatient lipitor daily once able to tolerate po's   Best Practice (right click and "Reselect all SmartList Selections" daily)  Diet/type: NPO w/ oral meds ( once RASS: 0) DVT prophylaxis: SCD GI prophylaxis: PPI Lines: N/A Foley:  Yes, and it is still needed Code Status:  DNR Last date of multidisciplinary goals of care discussion [07/15/23]   07/30: Updated pt and pts daughter in law at bedside regarding pts condition and current plan of care  Labs   CBC: Recent Labs  Lab 07/14/23 0915 07/14/23 2022 07/15/23 0512  WBC 3.4* 3.8* 4.9  NEUTROABS 2.3  --   --   HGB 11.8* 11.3* 10.4*  HCT 32.1* 31.8* 29.4*  MCV 92.0 92.7 91.3  PLT 43* 44* 58*    Basic Metabolic Panel: Recent Labs  Lab 07/14/23 0915 07/15/23 0512  NA 138 138  K 3.8 4.2  CL 107 107  CO2 22 22  GLUCOSE 120* 153*  BUN 14  17  CREATININE 1.04 0.91  CALCIUM 8.9 8.7*  MG  --  1.9  PHOS  --  3.9   GFR: Estimated Creatinine Clearance: 84.9 mL/min (by C-G formula based on SCr of 0.91 mg/dL). Recent Labs  Lab 07/14/23 0915 07/14/23 1230 07/14/23 2022 07/15/23 0512  WBC 3.4*  --  3.8* 4.9  LATICACIDVEN 2.1* 2.3*  --   --     Liver Function Tests: Recent Labs  Lab 07/14/23 0915 07/15/23 0512  AST 50* 40  ALT 37 29  ALKPHOS 97 83  BILITOT 2.0* 1.9*  PROT 6.2* 5.7*  ALBUMIN 3.0* 2.8*   No results for input(s): "LIPASE", "AMYLASE" in the last 168 hours. Recent Labs  Lab 07/14/23 0929  AMMONIA  65*    ABG    Component Value Date/Time   PHART 7.5 (H) 05/14/2022 1734   PCO2ART 27 (L) 05/14/2022 1734   PO2ART 76 (L) 05/14/2022 1734   HCO3 21.7 07/14/2023 2022   ACIDBASEDEF 2.5 (H) 07/14/2023 2022   O2SAT 98.3 07/14/2023 2022     Coagulation Profile: Recent Labs  Lab 07/14/23 0915 07/14/23 2022 07/15/23 0512  INR 1.5* 1.5* 1.5*    Cardiac Enzymes: No results for input(s): "CKTOTAL", "CKMB", "CKMBINDEX", "TROPONINI" in the last 168 hours.  HbA1C: Hgb A1c MFr Bld  Date/Time Value Ref Range Status  06/17/2023 06:13 AM 5.4 4.8 - 5.6 % Final    Comment:    (NOTE)         Prediabetes: 5.7 - 6.4         Diabetes: >6.4         Glycemic control for adults with diabetes: <7.0   04/07/2022 05:57 AM 6.0 (H) 4.8 - 5.6 % Final    Comment:    (NOTE) Pre diabetes:          5.7%-6.4%  Diabetes:              >6.4%  Glycemic control for   <7.0% adults with diabetes     CBG: Recent Labs  Lab 07/14/23 1850 07/14/23 2013 07/14/23 2356 07/15/23 0352 07/15/23 0712  GLUCAP 149* 148* 166* 157* 141*    Review of Systems:  UTA- patient not verbal at this time.  Past Medical History:  He,  has a past medical history of Arthritis, Bleeding ulcer, Cancer (HCC), Cirrhosis of liver (HCC), Diabetes mellitus without complication (HCC), Heart murmur, Hypertension, Iron deficiency anemia, NASH (nonalcoholic steatohepatitis), Sleep apnea, Spleen enlarged, Thrombocytopenia (HCC), and Varices, esophageal (HCC).   Surgical History:   Past Surgical History:  Procedure Laterality Date   CHOLECYSTECTOMY     COLONOSCOPY N/A 07/23/2019   Procedure: COLONOSCOPY;  Surgeon: Toney Reil, MD;  Location: [facility] ENDOSCOPY;  Service: Gastroenterology;  Laterality: N/A;   ESOPHAGOGASTRODUODENOSCOPY N/A 07/20/2019   Procedure: ESOPHAGOGASTRODUODENOSCOPY (EGD);  Surgeon: Toney Reil, MD;  Location: [facility] ENDOSCOPY;  Service: Gastroenterology;  Laterality: N/A;    ESOPHAGOGASTRODUODENOSCOPY Left 08/20/2019   Procedure: ESOPHAGOGASTRODUODENOSCOPY (EGD);  Surgeon: Pasty Spillers, MD;  Location: [facility] ENDOSCOPY;  Service: Endoscopy;  Laterality: Left;   ESOPHAGOGASTRODUODENOSCOPY (EGD) WITH PROPOFOL N/A 04/25/2021   Procedure: ESOPHAGOGASTRODUODENOSCOPY (EGD) WITH PROPOFOL;  Surgeon: Toney Reil, MD;  Location: [facility] ENDOSCOPY;  Service: Gastroenterology;  Laterality: N/A;   MECHANICAL AORTIC VALVE REPLACEMENT     SKIN BIOPSY     TEE WITHOUT CARDIOVERSION N/A 12/23/2018   Procedure: TRANSESOPHAGEAL ECHOCARDIOGRAM (TEE);  Surgeon: Dalia Heading, MD;  Location: [facility] ORS;  Service: Cardiovascular;  Laterality: N/A;  TEE WITHOUT CARDIOVERSION N/A 05/21/2022   Procedure: TRANSESOPHAGEAL ECHOCARDIOGRAM (TEE);  Surgeon: Antonieta Iba, MD;  Location: [facility] ORS;  Service: Cardiovascular;  Laterality: N/A;   TEE WITHOUT CARDIOVERSION N/A 04/09/2023   Procedure: TRANSESOPHAGEAL ECHOCARDIOGRAM;  Surgeon: Debbe Odea, MD;  Location: [facility] ORS;  Service: Cardiovascular;  Laterality: N/A;     Social History:   reports that he quit smoking about 54 years ago. His smoking use included cigarettes. He started smoking about 59 years ago. He has a 2.5 pack-year smoking history. He has never used smokeless tobacco. He reports that he does not currently use alcohol. He reports that he does not currently use drugs.   Family History:  His family history includes Heart attack in his brother.   Allergies Allergies  Allergen Reactions   Nsaids     Other Reaction(s): Ascites   Camphor Rash and Swelling   Nickel Rash   Sulfur Rash and Swelling   Sulfamethoxazole Swelling   Latex Rash   Lisinopril Hives    Other Reaction(s): Cough     Home Medications  Prior to Admission medications   Medication Sig Start Date End Date Taking? Authorizing Provider  ASPERCREME LIDOCAINE 4 % LIQD Apply 1 Application topically daily as needed. 06/07/22  Yes [provider]  atorvastatin (LIPITOR) 20 MG tablet Take 20 mg by mouth at bedtime.   Yes [provider]  augmented betamethasone dipropionate (DIPROLENE-AF) 0.05 % cream Apply 1 application topically 2 (two) times daily.   Yes [provider]  Cholecalciferol 50 MCG (2000 UT) TABS Take 1 tablet by mouth daily.   Yes [provider]  clotrimazole (LOTRIMIN) 1 % external solution Apply 1 Application topically 2 (two) times daily. Apply to toe nails 02/12/23  Yes [provider]  famotidine (PEPCID) 20 MG tablet Take 20 mg by mouth daily.   Yes [provider]  folic acid (FOLVITE) 1 MG tablet Take 1 tablet by mouth daily. 12/03/19  Yes [provider]  insulin aspart (NOVOLOG) 100 UNIT/ML injection Inject 10 Units into the skin 3 (three) times daily before meals. 04/26/21  Yes Rhetta Mura, MD  insulin glargine (LANTUS) 100 UNIT/ML injection Inject 0.14 mLs (14 Units total) into the skin daily. Patient taking differently: Inject 18 Units into the skin daily. 08/05/22  Yes Hollice Espy, MD  ketotifen (ZADITOR) 0.025 % ophthalmic solution Place 1 drop into both eyes 2 (two) times daily.   Yes [provider]  Lactobacillus Rhamnosus, GG, (RA PROBIOTIC DIGESTIVE CARE) CAPS Take 1 capsule by mouth daily.   Yes [provider]  lactulose (CHRONULAC) 10 GM/15ML solution Take 45 mLs (30 g total) by mouth 3 (three) times daily. Patient taking differently: Take 30-45 mLs by mouth at bedtime. 45 ml @0800 /1400. 30 ml QHS 06/18/23  Yes Agbata, Tochukwu, MD  levothyroxine (SYNTHROID) 100 MCG tablet Take 100 mcg by mouth daily before breakfast.   Yes [provider]  lidocaine (LIDODERM) 5 % Place 2 patches onto the skin daily. Remove & Discard patch within 12 hours or as directed by MD 05/28/22  Yes [name], MD  magnesium oxide (MAG-OX) 400 MG tablet Take 2 tablets (800 mg total) by mouth 2 (two) times daily. 05/28/22  Yes  [name], MD  Melatonin 10 MG TABS Take 10 mg by mouth at bedtime.   Yes [provider]  Omega-3 Fatty Acids (FISH OIL) 1000 MG CAPS Take 1 capsule by mouth 2 (two) times daily.  Yes [provider]  pantoprazole (PROTONIX) 40 MG tablet Take 1 tablet (40 mg total) by mouth 2 (two) times daily. 04/07/22 07/14/23 Yes Arnetha Courser, MD  rifaximin (XIFAXAN) 550 MG TABS tablet Take 550 mg by mouth 2 (two) times daily. Takes with lactulose   Yes [provider]  terazosin (HYTRIN) 5 MG capsule Take 5 mg by mouth at bedtime.   Yes [provider]  trolamine salicylate (ASPERCREME) 10 % cream Apply topically as needed for muscle pain. 05/28/22  Yes [name], MD  vitamin C (ASCORBIC ACID) 500 MG tablet Take 1,000 mg by mouth daily.   Yes [provider]  zinc oxide 20 % ointment Apply 1 Application topically in the morning and at bedtime. APPLY SMALL AMOUNT TO AFFECTED AREA TWICE A DAY FOR BUTTOCK RASH FOR BUTTOCK RASH 05/13/23  Yes [provider]  furosemide (LASIX) 20 MG tablet Take 1 tablet (20 mg total) by mouth daily. Patient taking differently: Take 20 mg by mouth daily. Only Monday, Wednesday and Friday 04/07/22   Arnetha Courser, MD  Semaglutide,0.25 or 0.5MG /DOS, 2 MG/1.5ML SOPN Inject 0.5 mg into the skin once a week. Thursday    [provider]     Critical care time: 83 minutes    Zada Girt, AGNP  Pulmonary/Critical Care Pager [phone] (please enter 7 digits) PCCM Consult Pager [phone] (please enter 7 digits)

## 2023-07-15 NOTE — TOC Initial Note (Addendum)
Transition of Care [facility]) - Initial/Assessment Note    Patient Details  Name: Joshua Berry MRN: [id number] Date of Birth: 03/03/1946  Transition of Care [facility]) CM/SW Contact:    Kreg Shropshire, RN Phone Number: 07/15/2023, 8:53 AM  Clinical Narrative:                 Pt arrived from ED from: [facility] in [place] ALF Caregiver Support: Pt and wife live [place] DME at Home: Environmental consultant Transportation: [employer] Person of Contact: Wife Aeronautical engineer [facility]CP: [facility]  Readmission prevention screening completed  Cm reached out to [employer] [facility] [place] [facility] [place]. She stated that they do not have any STR. The nurse of [facility] will have to come to an assessment whenever pt is ready for d/c in order for him to return.  Cm will continue to follow for toc needs and d/c planning.     Barriers to Discharge: Continued Medical Work up   Patient Goals and CMS Choice   CMS Medicare.gov Compare Post Acute Care list provided to:: Patient Choice offered to / list presented to : Spouse      Expected Discharge Plan and Services       Living arrangements for the past 2 months: Assisted Living Facility                                      Prior Living Arrangements/Services Living arrangements for the past 2 months: Assisted Living Facility Lives with:: Self, Spouse Patient language and need for interpreter reviewed:: Yes            Current home services: DME    Activities of Daily Living      Permission Sought/Granted                  Emotional Assessment Appearance:: Appears stated age     Orientation: : Oriented to Place, Oriented to Self      Admission diagnosis:  Uncal herniation (HCC) [G93.5] Somnolence [R40.0] Acute nontraumatic intracranial subdural hematoma (HCC) [I62.01] Subdural hematoma (HCC) [S06.5XAA] Patient Active Problem List   Diagnosis Date Noted   Subdural hematoma (HCC) 07/14/2023   Compression of brain  (HCC) 07/14/2023   Uncal herniation (HCC) 07/14/2023   Type 2 diabetes mellitus without complications (HCC) 06/17/2023   GERD without esophagitis 06/17/2023   Dyslipidemia 06/17/2023   Pressure injury of skin 06/17/2023   Acute urinary retention 04/11/2023   Streptococcal bacteremia 04/07/2023   Altered mental status 04/06/2023   Hepatic encephalopathy (HCC) 04/05/2023   Overweight (BMI 25.0-29.9) 08/03/2022   Hyponatremia, hypokalemia and hypomagnesemia 05/18/2022   Hyponatremia 05/18/2022   Hypokalemia 05/18/2022   Back pain 05/17/2022   Hypomagnesemia 05/17/2022   Acute hepatic encephalopathy (HCC) 05/17/2022   AKI (acute kidney injury) (HCC) 05/16/2022   Elevated liver enzymes 05/16/2022   Hypotension 05/16/2022   Right shoulder pain 05/16/2022   Hypothyroidism 05/16/2022   Physical deconditioning 05/16/2022   Obesity (BMI 30-39.9) 05/16/2022   Lactic acidosis 05/16/2022   Fall at home, initial encounter 05/16/2022   Septic shock due to Staphylococcus bacteremia 05/14/2022   Chest pain 04/06/2022   Controlled IDDM-2 with hyperglycemia 04/06/2022   HLD (hyperlipidemia) 04/06/2022   HTN (hypertension) 04/06/2022   Chronic diastolic CHF (congestive heart failure) (HCC) 04/06/2022   Pancytopenia (HCC) 04/06/2022   GI bleeding 04/24/2021   Liver cirrhosis secondary to NASH (HCC)  Secondary esophageal varices without bleeding (HCC)    Portal hypertension (HCC)    Stomach irritation    Melena    Acute gastrointestinal hemorrhage 07/19/2019   Severe sepsis (HCC) 12/20/2018   PCP:  [facility]:   [facility] - [place], [place] - [address] [place] [address] Phone: [phone] Fax: [phone]     Social Determinants of Health (SDOH) Social History: SDOH Screenings   Food Insecurity: No Food Insecurity (06/17/2023)  Housing: Low Risk  (06/17/2023)  Transportation Needs: Unmet Transportation Needs (06/17/2023)   Utilities: At Risk (06/17/2023)  Depression (PHQ2-9): Low Risk  (04/24/2023)  Social Connections: Unknown (04/29/2022)   Received from [facility]  Tobacco Use: Medium Risk (07/14/2023)   SDOH Interventions:     Readmission Risk Interventions    07/15/2023    8:52 AM 06/18/2023   10:19 AM 05/17/2022    1:45 PM  Readmission Risk Prevention Plan  Transportation Screening Complete Complete Complete  PCP or Specialist Appt within 5-7 Days  Complete Complete  PCP or Specialist Appt within 3-5 Days Complete    Home Care Screening  Complete Complete  Medication Review (RN CM)  Complete Complete  Social Work Consult for Recovery Care Planning/Counseling Complete    Palliative Care Screening Not Applicable    Medication Review Oceanographer) Referral to Pharmacy

## 2023-07-15 NOTE — Plan of Care (Signed)
  Problem: Education: Goal: Knowledge of General Education information will improve Description: Including pain rating scale, medication(s)/side effects and non-pharmacologic comfort measures Outcome: Progressing   Problem: Clinical Measurements: Goal: Ability to maintain clinical measurements within normal limits will improve Outcome: Progressing Goal: Will remain free from infection Outcome: Progressing Goal: Diagnostic test results will improve Outcome: Progressing   Problem: Coping: Goal: Level of anxiety will decrease Outcome: Progressing   Problem: Elimination: Goal: Will not experience complications related to bowel motility Outcome: Progressing

## 2023-07-15 NOTE — ED Notes (Addendum)
Pt transported to OR with OR staff with plasma still infusing.

## 2023-07-15 NOTE — Progress Notes (Addendum)
   Neurosurgery Progress Note  History: Joshua Berry is a 77 year old presenting with bilateral subdural hematomas status post bilateral bur hole washout.  POD1: Patient have some improvement in his headaches and neurologic status overnight.  Physical Exam: Vitals:   07/15/23 0845 07/15/23 0900  BP: (!) 132/56 134/60  Pulse:    Resp: 10 10  Temp:    SpO2: 99% 98%    Drowsy but arouses to voice.  Follows commands x 5.  Pupils equal round and reactive to light. Right drain output 341 Left drain output 78.8  Data:  Other tests/results:  CT head 07/15/23 (radiology report pending) Overall improvement of midline shift and bilateral subdural fluid collections.  Assessment/Plan:  Joshua Berry is a 76 year old presenting with altered mental status and headaches found to have bilateral subdural hematomas status post bilateral bur hole for washout.  - Continue drains. Monitor per orders - HOB flat - q1 hour neuro checks - Goal platelets >75k, INR <1.4   Manning Charity PA-C Department of Neurosurgery

## 2023-07-16 ENCOUNTER — Ambulatory Visit: Payer: No Typology Code available for payment source

## 2023-07-16 ENCOUNTER — Inpatient Hospital Stay: Payer: No Typology Code available for payment source

## 2023-07-16 DIAGNOSIS — R400 Somnolence: Secondary | ICD-10-CM

## 2023-07-16 DIAGNOSIS — R4182 Altered mental status, unspecified: Secondary | ICD-10-CM | POA: Diagnosis not present

## 2023-07-16 DIAGNOSIS — S065XAA Traumatic subdural hemorrhage with loss of consciousness status unknown, initial encounter: Secondary | ICD-10-CM

## 2023-07-16 LAB — BPAM PLATELET PHERESIS
Blood Product Expiration Date: 202408032359
ISSUE DATE / TIME: 202407311029
Unit Type and Rh: 6200

## 2023-07-16 LAB — GLUCOSE, CAPILLARY
Glucose-Capillary: 130 mg/dL — ABNORMAL HIGH (ref 70–99)
Glucose-Capillary: 136 mg/dL — ABNORMAL HIGH (ref 70–99)
Glucose-Capillary: 154 mg/dL — ABNORMAL HIGH (ref 70–99)
Glucose-Capillary: 142 mg/dL — ABNORMAL HIGH (ref 70–99)
Glucose-Capillary: 138 mg/dL — ABNORMAL HIGH (ref 70–99)
Glucose-Capillary: 139 mg/dL — ABNORMAL HIGH (ref 70–99)

## 2023-07-16 LAB — PREPARE PLATELET PHERESIS: Unit division: 0

## 2023-07-16 MED ORDER — STERILE WATER FOR INJECTION IJ SOLN
INTRAMUSCULAR | Status: AC
  Administered 2023-07-16: 10 mL
  Filled 2023-07-16: qty 10

## 2023-07-16 MED ORDER — SODIUM CHLORIDE 0.9 % IV SOLN
2000.0000 mg | Freq: Once | INTRAVENOUS | Status: AC
  Administered 2023-07-16: 2000 mg via INTRAVENOUS
  Filled 2023-07-16: qty 20

## 2023-07-16 MED ORDER — SODIUM CHLORIDE 0.9% IV SOLUTION: Freq: Once | INTRAVENOUS | Status: AC

## 2023-07-16 MED ORDER — LEVETIRACETAM IN NACL 500 MG/100ML IV SOLN
500.0000 mg | Freq: Two times a day (BID) | INTRAVENOUS | Status: DC
  Administered 2023-07-17 – 2023-07-18 (×3): 500 mg via INTRAVENOUS
  Filled 2023-07-16 (×4): qty 100

## 2023-07-16 MED ORDER — CLOTRIMAZOLE 1 % EX CREA
TOPICAL_CREAM | Freq: Two times a day (BID) | CUTANEOUS | Status: DC
  Administered 2023-07-16 – 2023-07-31 (×4): 1 via TOPICAL
  Filled 2023-07-16 (×2): qty 15

## 2023-07-16 NOTE — Progress Notes (Signed)
  PROGRESS NOTE    Joshua Berry  MVH:[id number] DOB: [birth year] DOA: 07/14/2023 PCP: [facility]  [id number]  LOS: 2 days   Brief hospital course:   Assessment & Plan: 77 yo M presenting to [facility] ED from [place] assisted living via EMS for evaluation of altered mental status.    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 --s/p plt and FFP  --No need for further plt and FFP supplementation, per neurosurgery   Cirrhosis secondary to NASH Transaminitis  PMHx: esophageal/gastric varices - Once able to tolerate po's resume outpatient rifaximin & lactulose   OSA Was on CPAP historically but not recently   Hypothyroidism - Once able to take po's will resume outpatient synthroid   Type 2 Diabetes Mellitus Insulin dependent --q4h BG checks    GERD  --IV PPI BID   Dyslipidemia - Resume outpatient lipitor daily once able to tolerate po's    DVT prophylaxis: SCD/Compression stockings Code Status: DNR  Family Communication: family updated at bedside today Level of care: ICU Dispo:   The patient is from: ALF Anticipated d/c is to: to be determined Anticipated d/c date is: to be determined   Subjective and Interval History:  Pt's mental status improving as the day went on, per family.  Had PT session.     Objective: Vitals:   07/16/23 1226 07/16/23 1300 07/16/23 1310 07/16/23 1400  BP:  (!) 130/59  129/65  Pulse:  (!) 165 64 66  Resp:  15 13 13   Temp: 99.1 F (37.3 C)     TempSrc: Axillary     SpO2:  93% 94% 93%  Weight:      Height:        Intake/Output Summary (Last 24 hours) at 07/16/2023 1514 Last data filed at 07/16/2023 1226 Gross per 24 hour  Intake 312.83 ml  Output  1409.3 ml  Net -1096.47 ml   Filed Weights   07/14/23 0924 07/15/23 0800 07/16/23 0500  Weight: 101 kg 102.4 kg 102.7 kg    Examination:   Constitutional: NAD, can interact minimally  CV: No cyanosis.   RESP: normal respiratory effort, on RA SKIN: warm, dry   Data Reviewed: I have personally reviewed labs and imaging studies  Time spent: 35 minutes  [name], MD [facility] If 7PM-7AM, please contact night-coverage 07/16/2023, 3:14 PM

## 2023-07-16 NOTE — Plan of Care (Signed)
  Problem: Clinical Measurements: Goal: Will remain free from infection Outcome: Progressing Goal: Respiratory complications will improve Outcome: Progressing Goal: Cardiovascular complication will be avoided Outcome: Progressing   Problem: Safety: Goal: Ability to remain free from injury will improve Outcome: Progressing   Problem: Metabolic: Goal: Ability to maintain appropriate glucose levels will improve Outcome: Progressing

## 2023-07-16 NOTE — Progress Notes (Signed)
Eeg done 

## 2023-07-16 NOTE — Evaluation (Signed)
Physical Therapy Evaluation Patient Details Name: Joshua Berry MRN: [id number] DOB: 01-01-1946 Today's Date: 07/16/2023  History of Present Illness  77 y/o male presented to ED on 07/14/23 from [place] for AMS and headache x 4-5 days. CT head showed acute bilateral subdural hematomas with L midline shift. S/p bilateral frontal burr holes on 7/29. PMH: cirrhosis, diabetes, HTN, heart murmur, sleep apnea, hx of melanoma, iron deficiency anemia.  Clinical Impression  Patient admitted with the above. PTA, patient lives at [facility] with wife and was modI with use of rollator. Patient lethargic throughout session but wakens to name being called and tactile stimulation. Requires totalA+2 for bed mobility this date. While sitting EOB, required modA initially with R lateral lean and pushing noted. Facilitated L lateral lean on elbow x 3 with improvement in midline sitting to min guard. Providing one word answers throughout session, however delayed responses. Following simple commands intermittently (<50% of time). Patient will benefit from skilled PT services during acute stay to address listed deficits. Patient will benefit from ongoing therapy at discharge to maximize functional independence and safety.         If plan is discharge home, recommend the following: Two people to help with walking and/or transfers;Two people to help with bathing/dressing/bathroom   Can travel by private vehicle   No    Equipment Recommendations Other (comment) (defer to next venue of care)  Recommendations for Other Services       Functional Status Assessment Patient has had a recent decline in their functional status and demonstrates the ability to make significant improvements in function in a reasonable and predictable amount of time.     Precautions / Restrictions Precautions Precautions: Fall Restrictions Weight Bearing Restrictions: No      Mobility  Bed Mobility Overal bed mobility: Needs  Assistance Bed Mobility: Supine to Sit, Sit to Supine     Supine to sit: Total assist, +2 for physical assistance Sit to supine: Total assist, +2 for physical assistance        Transfers                   General transfer comment: defer 2/2 lethargy and poor command following    Ambulation/Gait                  Stairs            Wheelchair Mobility     Tilt Bed    Modified Rankin (Stroke Patients Only)       Balance Overall balance assessment: Needs assistance Sitting-balance support: Bilateral upper extremity supported, Feet supported Sitting balance-Leahy Scale: Fair Sitting balance - Comments: initial MAX A with R lateral lean improving to CGA near midline                                     Pertinent Vitals/Pain Pain Assessment Pain Assessment: PAINAD Breathing: normal Negative Vocalization: occasional moan/groan, low speech, negative/disapproving quality Facial Expression: smiling or inexpressive Body Language: relaxed Consolability: distracted or reassured by voice/touch PAINAD Score: 2    Home Living Family/patient expects to be discharged to:: Assisted living                 Home Equipment: Rollator (4 wheels);Cane - single point;Shower Counsellor (2 wheels);Wheelchair - manual;Other (comment) (lift chair) Additional Comments: ALF    Prior Function Prior Level of Function : Independent/Modified Independent  Mobility Comments: MOD I with rollator to toilet and dining hall ADLs Comments: son reprots pt dresses independently     Hand Dominance   Dominant Hand: Right    Extremity/Trunk Assessment   Upper Extremity Assessment Upper Extremity Assessment: Generalized weakness    Lower Extremity Assessment Lower Extremity Assessment: Generalized weakness       Communication   Communication: No difficulties  Cognition Arousal/Alertness: Lethargic Behavior During Therapy: WFL  for tasks assessed/performed Overall Cognitive Status: Impaired/Different from baseline Area of Impairment: Attention, Following commands, Safety/judgement                   Current Attention Level: Focused   Following Commands: Follows one step commands inconsistently Safety/Judgement: Decreased awareness of safety, Decreased awareness of deficits     General Comments: providing one word responses but delayed. Following one step commands intermittently        General Comments      Exercises     Assessment/Plan    PT Assessment Patient needs continued PT services  PT Problem List Decreased strength;Decreased activity tolerance;Decreased balance;Decreased coordination;Decreased mobility;Decreased cognition;Decreased knowledge of use of DME;Decreased safety awareness;Decreased knowledge of precautions;Cardiopulmonary status limiting activity       PT Treatment Interventions DME instruction;Gait training;Functional mobility training;Therapeutic activities;Therapeutic exercise;Balance training;Patient/family education    PT Goals (Current goals can be found in the Care Plan section)  Acute Rehab PT Goals Patient Stated Goal: did not state PT Goal Formulation: With family Time For Goal Achievement: 07/30/23 Potential to Achieve Goals: Fair    Frequency Min 1X/week     Co-evaluation PT/OT/SLP Co-Evaluation/Treatment: Yes Reason for Co-Treatment: Complexity of the patient's impairments (multi-system involvement);For patient/therapist safety PT goals addressed during session: Mobility/safety with mobility OT goals addressed during session: ADL's and self-care       AM-PAC PT "6 Clicks" Mobility  Outcome Measure Help needed turning from your back to your side while in a flat bed without using bedrails?: Total Help needed moving from lying on your back to sitting on the side of a flat bed without using bedrails?: Total Help needed moving to and from a bed to a chair  (including a wheelchair)?: Total Help needed standing up from a chair using your arms (e.g., wheelchair or bedside chair)?: Total Help needed to walk in hospital room?: Total Help needed climbing 3-5 steps with a railing? : Total 6 Click Score: 6    End of Session   Activity Tolerance: Patient limited by lethargy Patient left: in bed;with call bell/phone within reach;with family/visitor present Nurse Communication: Mobility status PT Visit Diagnosis: Unsteadiness on feet (R26.81);Muscle weakness (generalized) (M62.81);Other abnormalities of gait and mobility (R26.89);Other symptoms and signs involving the nervous system (R29.898)    Time: 5176-1607 PT Time Calculation (min) (ACUTE ONLY): 29 min   Charges:   PT Evaluation $PT Eval Moderate Complexity: 1 Mod PT Treatments $Therapeutic Activity: 8-22 mins PT General Charges $$ ACUTE PT VISIT: 1 Visit         Maylon Peppers, PT, DPT Physical Therapist - [facility]  [facility]   Fedora Knisely A Jamilia Jacques 07/16/2023, 1:15 PM

## 2023-07-16 NOTE — Progress Notes (Signed)
   Neurosurgery Progress Note  History: Joshua Berry is a 77 year old presenting with bilateral subdural hematomas status post bilateral bur hole washout.  POD2: Patient is not complaining, but having less speech output. POD1: Patient have some improvement in his headaches and neurologic status overnight.  Physical Exam: Vitals:   07/16/23 0800 07/16/23 0834  BP: 117/63   Pulse: 63   Resp: 12   Temp:  98.5 F (36.9 C)  SpO2: 97%     Drowsy but arouses to voice.  OE to voice.  Answers simple questions but not orientation questions.  No overt drift. PERRL.  Does not initiate volitional movements of extremities, but does move UE and LE.  Right drain output 110 Left drain output 4  Data:  Other tests/results:  CT head 07/15/23 (radiology report pending) Overall improvement of midline shift and bilateral subdural fluid collections.  Assessment/Plan:  Joshua Berry is a 77 year old presenting with altered mental status and headaches found to have bilateral subdural hematomas status post bilateral bur hole for washout.  He has had a setback in his current mental status.  His drains have transitioned to draining spinal fluid - I have removed both drains and redressed his incisions.   - Continue drains. Monitor per orders - HOB flat - q2 hour neuro checks - Goal platelets >75k, INR <1.4  - CT now to evaluate for stroke or any new hemorrhage. If that is unrevealing, will initiate additional workup with EEG, consider neuro consultation, and any additional lab evaluation. - OK to work with PTOTST if mental status allows and CT is reassuring  Venetia Night Department of Neurosurgery

## 2023-07-16 NOTE — Telephone Encounter (Signed)
Patient still in ICU

## 2023-07-16 NOTE — Evaluation (Signed)
Occupational Therapy Evaluation Patient Details Name: Joshua Berry MRN: [id number] DOB: 1946/03/10 Today's Date: 07/16/2023   History of Present Illness 77 y/o male presented to ED on 07/14/23 from [place] for AMS and headache x 4-5 days. CT head showed acute bilateral subdural hematomas with L midline shift. S/p bilateral frontal burr holes on 7/29. PMH: cirrhosis, diabetes, HTN, heart murmur, sleep apnea, hx of melanoma, iron deficiency anemia.   Clinical Impression   Mr Urrego was seen for OT/PT co-evaluation this date. Prior to hospital admission, pt was MOD I using rollator at ALF. Pt currently requires TOTAL A x2 sup<>sit. Initial MOD A static sitting with R lateral lean, pushing noted. Facilitated L truncal weight shifting x3 and noted sitting balance improving to CGA near midline. Poor command following, pt responds to ~50% of questions with 1 word answers. Pleasant and opens eyes on command. Pt would benefit from skilled OT to address noted impairments and functional limitations (see below for any additional details). Upon hospital discharge, recommend follow up therapy.   Recommendations for follow up therapy are one component of a multi-disciplinary discharge planning process, led by the attending physician.  Recommendations may be updated based on patient status, additional functional criteria and insurance authorization.   Assistance Recommended at Discharge Frequent or constant Supervision/Assistance  Patient can return home with the following Two people to help with walking and/or transfers;Two people to help with bathing/dressing/bathroom;Help with stairs or ramp for entrance    Functional Status Assessment  Patient has had a recent decline in their functional status and demonstrates the ability to make significant improvements in function in a reasonable and predictable amount of time.  Equipment Recommendations  Other (comment) (defer to next venue of care)    Recommendations  for Other Services       Precautions / Restrictions Precautions Precautions: Fall Restrictions Weight Bearing Restrictions: No      Mobility Bed Mobility Overal bed mobility: Needs Assistance Bed Mobility: Supine to Sit, Sit to Supine     Supine to sit: Total assist, +2 for physical assistance Sit to supine: Total assist, +2 for physical assistance        Transfers                   General transfer comment: defer 2/2 lethary and poor command following      Balance Overall balance assessment: Needs assistance Sitting-balance support: Bilateral upper extremity supported, Feet supported Sitting balance-Leahy Scale: Fair Sitting balance - Comments: initial MOD A with R lateral lean improving to CGA near midline                                   ADL either performed or assessed with clinical judgement   ADL Overall ADL's : Needs assistance/impaired                                       General ADL Comments: Difficulty command following to participate in ADLs, anticipate +2 for all seated tasks      Pertinent Vitals/Pain Pain Assessment Pain Assessment: PAINAD Breathing: normal Negative Vocalization: occasional moan/groan, low speech, negative/disapproving quality Facial Expression: smiling or inexpressive Body Language: relaxed Consolability: distracted or reassured by voice/touch PAINAD Score: 2     Hand Dominance Right   Extremity/Trunk Assessment Upper Extremity Assessment Upper Extremity Assessment: Generalized  weakness   Lower Extremity Assessment Lower Extremity Assessment: Generalized weakness       Communication Communication Communication: No difficulties   Cognition Arousal/Alertness: Lethargic Behavior During Therapy: WFL for tasks assessed/performed Overall Cognitive Status: Impaired/Different from baseline Area of Impairment: Attention, Following commands, Safety/judgement                    Current Attention Level: Focused   Following Commands: Follows one step commands inconsistently Safety/Judgement: Decreased awareness of safety, Decreased awareness of deficits            Home Living Family/patient expects to be discharged to:: Assisted living                             Home Equipment: Rollator (4 wheels);Cane - single point;Shower Counsellor (2 wheels);Wheelchair - Careers adviser (comment) (lift chair)   Additional Comments: ALF      Prior Functioning/Environment Prior Level of Function : Independent/Modified Independent             Mobility Comments: MOD I with rollator to toilet and dining hall ADLs Comments: son reprots pt dresses independently        OT Problem List: Decreased strength;Decreased range of motion;Decreased activity tolerance;Impaired balance (sitting and/or standing);Decreased cognition;Decreased safety awareness      OT Treatment/Interventions: Self-care/ADL training;Therapeutic exercise;Neuromuscular education;Energy conservation;DME and/or AE instruction;Therapeutic activities;Patient/family education;Balance training    OT Goals(Current goals can be found in the care plan section) Acute Rehab OT Goals Patient Stated Goal: to return to PLOF OT Goal Formulation: With patient/family Time For Goal Achievement: 07/30/23 Potential to Achieve Goals: Fair ADL Goals Pt Will Perform Grooming: with modified independence;sitting Pt Will Perform Lower Body Dressing: with min assist;sit to/from stand Pt Will Transfer to Toilet: with mod assist;stand pivot transfer;bedside commode  OT Frequency: Min 1X/week    Co-evaluation PT/OT/SLP Co-Evaluation/Treatment: Yes Reason for Co-Treatment: Complexity of the patient's impairments (multi-system involvement);For patient/therapist safety PT goals addressed during session: Mobility/safety with mobility OT goals addressed during session: ADL's and self-care      AM-PAC OT  "6 Clicks" Daily Activity     Outcome Measure Help from another person eating meals?: A Little Help from another person taking care of personal grooming?: A Lot Help from another person toileting, which includes using toliet, bedpan, or urinal?: A Lot Help from another person bathing (including washing, rinsing, drying)?: A Lot Help from another person to put on and taking off regular upper body clothing?: A Little Help from another person to put on and taking off regular lower body clothing?: A Lot 6 Click Score: 14   End of Session Nurse Communication: Mobility status  Activity Tolerance: Patient tolerated treatment well Patient left: in bed;with call bell/phone within reach;with family/visitor present  OT Visit Diagnosis: Other abnormalities of gait and mobility (R26.89);Muscle weakness (generalized) (M62.81)                Time: 5784-6962 OT Time Calculation (min): 29 min Charges:  OT General Charges $OT Visit: 1 Visit OT Evaluation $OT Eval Moderate Complexity: 1 Mod Kathie Dike, M.S. OTR/L  07/16/23, 1:05 PM  ascom [phone]

## 2023-07-16 NOTE — NC FL2 (Signed)
[place] MEDICAID FL2 LEVEL OF CARE FORM     IDENTIFICATION  Patient Name: Joshua Berry Birthdate: May 02, 1946 Sex: male Admission Date (Current Location): 07/14/2023  [place] and [place] Number:  Chiropodist and Address:  [facility], [address] [place], [place] [address]      Provider Number: [id number]  Attending Physician Name and Address:  [name], MD  Relative Name and Phone Number:  Wife Tadeusz Nealy [phone]    Current Level of Care: Hospital Recommended Level of Care: Skilled Nursing Facility Prior Approval Number:    Date Approved/Denied:   PASRR Number: [id number]  Discharge Plan: SNF    Current Diagnoses: Patient Active Problem List   Diagnosis Date Noted   Acute nontraumatic intracranial subdural hematoma (HCC) 07/15/2023   Subdural hematoma (HCC) 07/14/2023   Compression of brain (HCC) 07/14/2023   Uncal herniation (HCC) 07/14/2023   Type 2 diabetes mellitus without complications (HCC) 06/17/2023   GERD without esophagitis 06/17/2023   Dyslipidemia 06/17/2023   Pressure injury of skin 06/17/2023   Acute urinary retention 04/11/2023   Streptococcal bacteremia 04/07/2023   Altered mental status 04/06/2023   Hepatic encephalopathy (HCC) 04/05/2023   Overweight (BMI 25.0-29.9) 08/03/2022   Hyponatremia, hypokalemia and hypomagnesemia 05/18/2022   Hyponatremia 05/18/2022   Hypokalemia 05/18/2022   Back pain 05/17/2022   Hypomagnesemia 05/17/2022   Acute hepatic encephalopathy (HCC) 05/17/2022   AKI (acute kidney injury) (HCC) 05/16/2022   Elevated liver enzymes 05/16/2022   Hypotension 05/16/2022   Right shoulder pain 05/16/2022   Hypothyroidism 05/16/2022   Physical deconditioning 05/16/2022   Obesity (BMI 30-39.9) 05/16/2022   Lactic acidosis 05/16/2022   Fall at home, initial encounter 05/16/2022   Septic shock due to Staphylococcus bacteremia 05/14/2022   Chest pain 04/06/2022   Controlled IDDM-2  with hyperglycemia 04/06/2022   HLD (hyperlipidemia) 04/06/2022   HTN (hypertension) 04/06/2022   Chronic diastolic CHF (congestive heart failure) (HCC) 04/06/2022   Pancytopenia (HCC) 04/06/2022   GI bleeding 04/24/2021   Cirrhosis of liver without ascites (HCC)    Secondary esophageal varices without bleeding (HCC)    Portal hypertension (HCC)    Stomach irritation    Melena    Acute gastrointestinal hemorrhage 07/19/2019   Severe sepsis (HCC) 12/20/2018    Orientation RESPIRATION BLADDER Height & Weight     Self  O2 External catheter Weight: 102.7 kg Height:  6\' 2"  (188 cm)  BEHAVIORAL SYMPTOMS/MOOD NEUROLOGICAL BOWEL NUTRITION STATUS      Incontinent Diet (see d/c summary)  AMBULATORY STATUS COMMUNICATION OF NEEDS Skin   Extensive Assist Verbally Normal                       Personal Care Assistance Level of Assistance  Bathing, Feeding, Dressing Bathing Assistance: Maximum assistance Feeding assistance: Maximum assistance Dressing Assistance: Maximum assistance     Functional Limitations Info             SPECIAL CARE FACTORS FREQUENCY  PT (By licensed PT), OT (By licensed OT)     PT Frequency: x5 min weekly OT Frequency: x5 min weekly            Contractures      Additional Factors Info  Code Status, Allergies Code Status Info: DNR Allergies Info: NSAIDS (ascities)           Current Medications (07/16/2023):  This is the current hospital active medication list Current Facility-Administered Medications  Medication Dose Route Frequency  Provider Last Rate Last Admin   0.9 %  sodium chloride infusion  10 mL/hr Intravenous Once Venetia Night, MD       0.9 %  sodium chloride infusion  10 mL/hr Intravenous Once Venetia Night, MD       Chlorhexidine Gluconate Cloth 2 % PADS 6 each  6 each Topical Daily Raechel Chute, MD   6 each at 07/16/23 1435   clotrimazole (LOTRIMIN) 1 % cream   Topical BID [name], MD   Given at 07/16/23 1526    hydrALAZINE (APRESOLINE) injection 10 mg  10 mg Intravenous Q4H PRN Paliwal, Aditya, MD       insulin aspart (novoLOG) injection 0-6 Units  0-6 Units Subcutaneous Q4H Rust-Chester, Cecelia Byars, NP   1 Units at 07/16/23 1232   labetalol (NORMODYNE) injection 10 mg  10 mg Intravenous Q2H PRN Paliwal, Aditya, MD       levETIRAcetam (KEPPRA) 2,000 mg in sodium chloride 0.9 % 250 mL IVPB  2,000 mg Intravenous Once Jefferson Fuel, MD       Followed by   Melene Muller ON 07/17/2023] levETIRAcetam (KEPPRA) IVPB 500 mg/100 mL premix  500 mg Intravenous Q12H Jefferson Fuel, MD       morphine (PF) 2 MG/ML injection 1 mg  1 mg Intravenous Q3H PRN Venetia Night, MD   1 mg at 07/16/23 0010   Oral care mouth rinse  15 mL Mouth Rinse 4 times per day Raechel Chute, MD   15 mL at 07/16/23 1527   Oral care mouth rinse  15 mL Mouth Rinse PRN Raechel Chute, MD   15 mL at 07/15/23 0400   pantoprazole (PROTONIX) injection 40 mg  40 mg Intravenous Q12H Rust-Chester, Britton L, NP   40 mg at 07/16/23 6063   phytonadione (VITAMIN K) 10 mg in dextrose 5 % 50 mL IVPB  10 mg Intravenous Q24H Raechel Chute, MD 51 mL/hr at 07/16/23 1225 10 mg at 07/16/23 1225   polyethylene glycol (MIRALAX / GLYCOLAX) packet 17 g  17 g Oral Daily PRN Rust-Chester, Cecelia Byars, NP         Discharge Medications: Please see discharge summary for a list of discharge medications.  Relevant Imaging Results:  Relevant Lab Results:   Additional Information SSN [id number]  Kreg Shropshire, RN

## 2023-07-16 NOTE — TOC Progression Note (Addendum)
Transition of Care [facility]) - Progression Note    Patient Details  Name: Joshua Berry MRN: [id number] Date of Birth: 1946/01/19  Transition of Care [facility]) CM/SW Contact  Kreg Shropshire, RN Phone Number: 07/16/2023, 4:50 PM  Clinical Narrative:     PT/OT recommended STR with SNF. Cm spoke with wife and ask for preference. She stated that she would prefer [facility] or [facility]. Not [facility]. Pt has [facility] administration insurance. Cm sent email to [email or web address]) to ask about STR rehab benefits. CC supervisor Minerva Areola on email. Bed search initated  Cm awaiting to hear back.    Barriers to Discharge: Continued Medical Work up  Expected Discharge Plan and Services       Living arrangements for the past 2 months: Assisted Living Facility                                       Social Determinants of Health (SDOH) Interventions SDOH Screenings   Food Insecurity: No Food Insecurity (06/17/2023)  Housing: Low Risk  (06/17/2023)  Transportation Needs: Unmet Transportation Needs (06/17/2023)  Utilities: At Risk (06/17/2023)  Depression (PHQ2-9): Low Risk  (04/24/2023)  Social Connections: Unknown (04/29/2022)   Received from [facility]  Tobacco Use: Medium Risk (07/14/2023)    Readmission Risk Interventions    07/15/2023    8:52 AM 06/18/2023   10:19 AM 05/17/2022    1:45 PM  Readmission Risk Prevention Plan  Transportation Screening Complete Complete Complete  PCP or Specialist Appt within 5-7 Days  Complete Complete  PCP or Specialist Appt within 3-5 Days Complete    Home Care Screening  Complete Complete  Medication Review (RN CM)  Complete Complete  Social Work Consult for Recovery Care Planning/Counseling Complete    Palliative Care Screening Not Applicable    Medication Review Oceanographer) Referral to Pharmacy

## 2023-07-16 NOTE — Consult Note (Signed)
NEUROLOGY CONSULTATION NOTE   Date of service: July 16, 2023 Patient Name: Joshua Berry MRN:  [id number] DOB:  Apr 06, 1946 Reason for consult: AMS Requesting physician: Dr. 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 His mental status was improving and yesterday was able to answer questions appropriately and identify family members by voice over the phone. Today he is extremely lethargic and does not answer orientation questions for me at all. Per family overall is much worse than yesterday but has been fluctuating throughout the day. At baseline he lives in ALF and is normally oriented x3 and interactive. rEEG today showed no epileptiform activity. Head CT today overall improved from pre-op, with slight increase in acute subdural blood on L and stable intracranial air from prior, left to right shift of 1-62mm.   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   Past Surgical History:  Procedure Laterality Date   BURR HOLE Bilateral 07/14/2023   Procedure: BURR HOLES;  Surgeon: Venetia Night, MD;  Location: [facility] ORS;  Service: Neurosurgery;  Laterality: Bilateral;   CHOLECYSTECTOMY     COLONOSCOPY N/A 07/23/2019   Procedure: COLONOSCOPY;  Surgeon: Toney Reil, MD;  Location: [facility] ENDOSCOPY;  Service: Gastroenterology;   Laterality: N/A;   ESOPHAGOGASTRODUODENOSCOPY N/A 07/20/2019   Procedure: ESOPHAGOGASTRODUODENOSCOPY (EGD);  Surgeon: Toney Reil, MD;  Location: [facility] ENDOSCOPY;  Service: Gastroenterology;  Laterality: N/A;   ESOPHAGOGASTRODUODENOSCOPY Left 08/20/2019   Procedure: ESOPHAGOGASTRODUODENOSCOPY (EGD);  Surgeon: Pasty Spillers, MD;  Location: [facility] ENDOSCOPY;  Service: Endoscopy;  Laterality: Left;   ESOPHAGOGASTRODUODENOSCOPY (EGD) WITH PROPOFOL N/A 04/25/2021   Procedure: ESOPHAGOGASTRODUODENOSCOPY (EGD) WITH PROPOFOL;  Surgeon: Toney Reil, MD;  Location: [name] ENDOSCOPY;  Service: Gastroenterology;  Laterality: N/A;   MECHANICAL AORTIC VALVE REPLACEMENT     SKIN BIOPSY     TEE WITHOUT CARDIOVERSION N/A 12/23/2018   Procedure: TRANSESOPHAGEAL ECHOCARDIOGRAM (TEE);  Surgeon: Dalia Heading, MD;  Location: [facility] ORS;  Service: Cardiovascular;  Laterality: N/A;   TEE WITHOUT CARDIOVERSION N/A 05/21/2022   Procedure: TRANSESOPHAGEAL ECHOCARDIOGRAM (TEE);  Surgeon: Antonieta Iba, MD;  Location: [facility] ORS;  Service: Cardiovascular;  Laterality: N/A;   TEE WITHOUT CARDIOVERSION N/A 04/09/2023   Procedure: TRANSESOPHAGEAL ECHOCARDIOGRAM;  Surgeon: Debbe Odea, MD;  Location: [facility] ORS;  Service: Cardiovascular;  Laterality: N/A;   Family History  Problem Relation Age of Onset   Heart attack Brother    Social History   Socioeconomic History   Marital status: Married    Spouse name: Ke Bungay   Number of children: Not on file   Years of education:  Not on file   Highest education level: Not on file  Occupational History   Not on file  Tobacco Use   Smoking status: Former    Current packs/day: 0.00    Average packs/day: 0.5 packs/day for 5.0 years (2.5 ttl pk-yrs)    Types: Cigarettes    Start date: 10/17/1963    Quit date: 10/16/1968    Years since quitting: 54.7   Smokeless tobacco: Never  Vaping Use   Vaping status: Never Used  Substance and Sexual Activity    Alcohol use: Not Currently   Drug use: Not Currently   Sexual activity: Not Currently  Other Topics Concern   Not on file  Social History Narrative   Not on file   Social Determinants of Health   Financial Resource Strain: Not on file  Food Insecurity: No Food Insecurity (06/17/2023)   Hunger Vital Sign    Worried About Running Out of Food in the Last Year: Never true    Ran Out of Food in the Last Year: Never true  Transportation Needs: Unmet Transportation Needs (06/17/2023)   PRAPARE - Administrator, Civil Service (Medical): Yes    Lack of Transportation (Non-Medical): No  Physical Activity: Not on file  Stress: Not on file  Social Connections: Unknown (04/29/2022)   [facility]   Social Network    Social Network: Not on file   Allergies  Allergen Reactions   Nsaids     Other Reaction(s): Ascites   Camphor Rash and Swelling   Nickel Rash   Sulfur Rash and Swelling   Sulfamethoxazole Swelling   Latex Rash   Lisinopril Hives    Other Reaction(s): Cough    Medications   Medications Prior to Admission  Medication Sig Dispense Refill Last Dose   ASPERCREME LIDOCAINE 4 % LIQD Apply 1 Application topically daily as needed.   prn at unk   atorvastatin (LIPITOR) 20 MG tablet Take 20 mg by mouth at bedtime.   07/13/2023 at 2100   augmented betamethasone dipropionate (DIPROLENE-AF) 0.05 % cream Apply 1 application topically 2 (two) times daily.   07/13/2023 at 2100   Cholecalciferol 50 MCG (2000 UT) TABS Take 1 tablet by mouth daily.   07/13/2023 at 0700   clotrimazole (LOTRIMIN) 1 % external solution Apply 1 Application topically 2 (two) times daily. Apply to toe nails   07/13/2023 at 2100   famotidine (PEPCID) 20 MG tablet Take 20 mg by mouth daily.   07/13/2023 at 0700   folic acid (FOLVITE) 1 MG tablet Take 1 tablet by mouth daily.   07/13/2023 at 0700   insulin aspart (NOVOLOG) 100 UNIT/ML injection Inject 10 Units into the skin 3 (three) times daily  before meals. 10 mL 11 07/13/2023 at 1645   insulin glargine (LANTUS) 100 UNIT/ML injection Inject 0.14 mLs (14 Units total) into the skin daily. (Patient taking differently: Inject 18 Units into the skin daily.)   07/13/2023 at 0745   ketotifen (ZADITOR) 0.025 % ophthalmic solution Place 1 drop into both eyes 2 (two) times daily.   07/13/2023 at 2100   Lactobacillus Rhamnosus, GG, (RA PROBIOTIC DIGESTIVE CARE) CAPS Take 1 capsule by mouth daily.   07/13/2023 at 0700   lactulose (CHRONULAC) 10 GM/15ML solution Take 45 mLs (30 g total) by mouth 3 (three) times daily. (Patient taking differently: Take 30-45 mLs by mouth at bedtime. 45 ml @0800 /1400. 30 ml QHS) 236 mL 0 07/13/2023 at 2000   levothyroxine (SYNTHROID) 100  MCG tablet Take 100 mcg by mouth daily before breakfast.   07/13/2023 at 0700   lidocaine (LIDODERM) 5 % Place 2 patches onto the skin daily. Remove & Discard patch within 12 hours or as directed by MD  0 07/13/2023 at 0700   magnesium oxide (MAG-OX) 400 MG tablet Take 2 tablets (800 mg total) by mouth 2 (two) times daily.   07/13/2023 at 2100   Melatonin 10 MG TABS Take 10 mg by mouth at bedtime.   07/13/2023 at 2100   Omega-3 Fatty Acids (FISH OIL) 1000 MG CAPS Take 1 capsule by mouth 2 (two) times daily.   07/13/2023 at 2100   pantoprazole (PROTONIX) 40 MG tablet Take 1 tablet (40 mg total) by mouth 2 (two) times daily. 30 tablet 3 07/13/2023 at 2100   rifaximin (XIFAXAN) 550 MG TABS tablet Take 550 mg by mouth 2 (two) times daily. Takes with lactulose   07/13/2023 at 2100   terazosin (HYTRIN) 5 MG capsule Take 5 mg by mouth at bedtime.   07/13/2023 at 2100   trolamine salicylate (ASPERCREME) 10 % cream Apply topically as needed for muscle pain. 85 g 0 prn at unk   vitamin C (ASCORBIC ACID) 500 MG tablet Take 1,000 mg by mouth daily.   07/13/2023 at 0700   zinc oxide 20 % ointment Apply 1 Application topically in the morning and at bedtime. APPLY SMALL AMOUNT TO AFFECTED AREA TWICE A DAY FOR BUTTOCK  RASH FOR BUTTOCK RASH   07/13/2023 at 2100   furosemide (LASIX) 20 MG tablet Take 1 tablet (20 mg total) by mouth daily. (Patient taking differently: Take 20 mg by mouth daily. Only Monday, Wednesday and Friday) 30 tablet 1 07/11/2023 at 0700   Semaglutide,0.25 or 0.5MG /DOS, 2 MG/1.5ML SOPN Inject 0.5 mg into the skin once a week. Thursday   07/10/2023 at 0900      Current Facility-Administered Medications:    0.9 %  sodium chloride infusion, 10 mL/hr, Intravenous, Once, Venetia Night, MD   0.9 %  sodium chloride infusion, 10 mL/hr, Intravenous, Once, Venetia Night, MD   Chlorhexidine Gluconate Cloth 2 % PADS 6 each, 6 each, Topical, Daily, Raechel Chute, MD, 6 each at 07/16/23 1435   clotrimazole (LOTRIMIN) 1 % cream, , Topical, BID, [name], MD, Given at 07/16/23 1526   hydrALAZINE (APRESOLINE) injection 10 mg, 10 mg, Intravenous, Q4H PRN, Paliwal, Aditya, MD   insulin aspart (novoLOG) injection 0-6 Units, 0-6 Units, Subcutaneous, Q4H, Rust-Chester, Cecelia Byars, NP, 1 Units at 07/16/23 1232   labetalol (NORMODYNE) injection 10 mg, 10 mg, Intravenous, Q2H PRN, Paliwal, Aditya, MD   [COMPLETED] levETIRAcetam (KEPPRA) 2,000 mg in sodium chloride 0.9 % 250 mL IVPB, 2,000 mg, Intravenous, Once, Stopped at 07/16/23 1721 **FOLLOWED BY** [START ON 07/17/2023] levETIRAcetam (KEPPRA) IVPB 500 mg/100 mL premix, 500 mg, Intravenous, Q12H, Jefferson Fuel, MD   morphine (PF) 2 MG/ML injection 1 mg, 1 mg, Intravenous, Q3H PRN, Venetia Night, MD, 1 mg at 07/16/23 0010   Oral care mouth rinse, 15 mL, Mouth Rinse, 4 times per day, Raechel Chute, MD, 15 mL at 07/16/23 1527   Oral care mouth rinse, 15 mL, Mouth Rinse, PRN, Dgayli, Khabib, MD, 15 mL at 07/15/23 0400   pantoprazole (PROTONIX) injection 40 mg, 40 mg, Intravenous, Q12H, Rust-Chester, Britton L, NP, 40 mg at 07/16/23 0909   phytonadione (VITAMIN K) 10 mg in dextrose 5 % 50 mL IVPB, 10 mg, Intravenous, Q24H, Dgayli, Khabib, MD, Last Rate:  51 mL/hr  at 07/16/23 1225, 10 mg at 07/16/23 1225   polyethylene glycol (MIRALAX / GLYCOLAX) packet 17 g, 17 g, Oral, Daily PRN, Rust-Chester, Cecelia Byars, NP  Vitals   Vitals:   07/16/23 1600 07/16/23 1700 07/16/23 1800 07/16/23 1900  BP: (!) 143/60 (!) 141/68 131/62 (!) 134/58  Pulse: 63 66 63 (!) 55  Resp: 10 12 13 15   Temp:  99.3 F (37.4 C)    TempSrc:  Oral    SpO2: 96% 97% 92% 92%  Weight:      Height:         Body mass index is 29.07 kg/m.  Physical Exam   Gen: patient lying in bed, NAD CV: extremities appear well-perfused Resp: normal WOB   Neurologic exam MS: arousable only to sternal rub, requires repeated stimulation to attend, does not follow commands Speech: no intelligible speech CN: PERRL, (+) oculocephalics, corneals, face symmetric at rest Motor & sensory: withdraws to noxious stimuli in all extremities Coordination, gait: UTA   Labs   CBC:  Recent Labs  Lab 07/15/23 1319 07/15/23 2139 07/16/23 0434  WBC 6.4 5.4 5.1  NEUTROABS 5.2 4.1  --   HGB 10.0* 9.6* 10.0*  HCT 27.3* 27.4* 28.7*  MCV 91.6 94.5 94.7  PLT 72* 74* 62*    Basic Metabolic Panel:  Lab Results  Component Value Date   NA 139 07/16/2023   K 4.0 07/16/2023   CO2 24 07/16/2023   GLUCOSE 128 (H) 07/16/2023   BUN 24 (H) 07/16/2023   CREATININE 0.93 07/16/2023   CALCIUM 8.6 (L) 07/16/2023   GFRNONAA >60 07/16/2023   GFRAA >60 09/23/2019   Lipid Panel:  Lab Results  Component Value Date   LDLCALC 78 04/06/2022   HgbA1c:  Lab Results  Component Value Date   HGBA1C 5.4 06/17/2023   Urine Drug Screen: No results found for: "LABOPIA", "COCAINSCRNUR", "LABBENZ", "AMPHETMU", "THCU", "LABBARB"  Alcohol Level     Component Value Date/Time   ETH <10 04/05/2023 1642    CT Head without contrast: 1. Both subdural drains have been removed. The amount of intracranial air is stable or slightly diminished. 2. On the right, the subdural hematoma is stable, maximal thickness 7  mm. 3. On the left, the subdural blood and fluid is overall stable, but there is a slight increase in acute subdural blood at about the level of the left-sided burr hole. If this does not continue to increase, it is probably not significant, but certainly this should be watched. There is left-to-right shift of 1-2 mm.  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 Given his bilateral SDH s/p burr hole evacuation and significant fluctuations in mental status per family seizures are high on the differential. rEEG showed no epileptiform activity but my suspicion is high enough to start him on empiric keppra. Repeat head CT today overall improved from pre-op, with slight increase in acute subdural blood on L and stable intracranial air from prior, left to right shift of 1-3mm; no findings to explain altered mentation. If no improvement by tomorrow, will order MRI brain.  Recommendations   - Keppra 2g load f/b 500mg  bid - Consider  MRI brain tomorrow if not improved - Will continue to follow ______________________________________________________________________   Thank you for the opportunity to take part in the care of this patient. If you have any further questions, please contact the neurology consultation attending.  Signed,  Bing Neighbors, MD [facility] [phone]  If 7pm- 7am, please page neurology on call as listed in AMION.  **Any copied and pasted documentation in this note was written by me in another application not billed for and pasted by me into this document.

## 2023-07-16 NOTE — Progress Notes (Signed)
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   Goal of Therapy:  Electrolytes WNL  Plan:  ---no electrolyte replacement warranted for today ---recheck electrolytes in am  Lowella Bandy ,PharmD Clinical Pharmacist 07/16/2023 7:15 AM

## 2023-07-16 NOTE — Progress Notes (Signed)
0730 patient alert to self follows some commands and answers some questions  0850 Bilateral drains removed by neurosurgery 0900 called CT will take patient at 1000 Update given to wife on phone [place] neurosurgeon [phone] called blood bank platelets here waiting on approval from blood bank director

## 2023-07-17 DIAGNOSIS — R569 Unspecified convulsions: Secondary | ICD-10-CM

## 2023-07-17 DIAGNOSIS — S065XAA Traumatic subdural hemorrhage with loss of consciousness status unknown, initial encounter: Secondary | ICD-10-CM | POA: Diagnosis not present

## 2023-07-17 LAB — GLUCOSE, CAPILLARY
Glucose-Capillary: 123 mg/dL — ABNORMAL HIGH (ref 70–99)
Glucose-Capillary: 121 mg/dL — ABNORMAL HIGH (ref 70–99)
Glucose-Capillary: 125 mg/dL — ABNORMAL HIGH (ref 70–99)
Glucose-Capillary: 163 mg/dL — ABNORMAL HIGH (ref 70–99)
Glucose-Capillary: 121 mg/dL — ABNORMAL HIGH (ref 70–99)
Glucose-Capillary: 227 mg/dL — ABNORMAL HIGH (ref 70–99)

## 2023-07-17 MED ORDER — POLYVINYL ALCOHOL 1.4 % OP SOLN
1.0000 [drp] | OPHTHALMIC | Status: DC | PRN
  Administered 2023-07-17 – 2023-07-19 (×2): 1 [drp] via OPHTHALMIC
  Filled 2023-07-17 (×2): qty 15

## 2023-07-17 MED ORDER — ENSURE ENLIVE PO LIQD
237.0000 mL | Freq: Three times a day (TID) | ORAL | Status: DC
  Administered 2023-07-17 – 2023-07-18 (×4): 237 mL via ORAL

## 2023-07-17 MED ORDER — ADULT MULTIVITAMIN W/MINERALS CH
1.0000 | ORAL_TABLET | Freq: Every day | ORAL | Status: DC
  Administered 2023-07-18 – 2023-08-01 (×14): 1 via ORAL
  Filled 2023-07-17 (×14): qty 1

## 2023-07-17 MED ORDER — INSULIN ASPART 100 UNIT/ML IJ SOLN
0.0000 [IU] | Freq: Three times a day (TID) | INTRAMUSCULAR | Status: DC
  Administered 2023-07-18 – 2023-07-19 (×2): 1 [IU] via SUBCUTANEOUS
  Administered 2023-07-19: 2 [IU] via SUBCUTANEOUS
  Administered 2023-07-20 – 2023-07-21 (×3): 1 [IU] via SUBCUTANEOUS
  Administered 2023-07-23: 2 [IU] via SUBCUTANEOUS
  Administered 2023-07-24 – 2023-07-25 (×3): 1 [IU] via SUBCUTANEOUS
  Administered 2023-07-26: 2 [IU] via SUBCUTANEOUS
  Administered 2023-07-26 – 2023-07-30 (×9): 1 [IU] via SUBCUTANEOUS
  Administered 2023-07-31 – 2023-08-01 (×2): 2 [IU] via SUBCUTANEOUS
  Filled 2023-07-17 (×20): qty 1

## 2023-07-17 NOTE — Plan of Care (Signed)
  Problem: Clinical Measurements: Goal: Respiratory complications will improve Outcome: Progressing   Problem: Activity: Goal: Risk for activity intolerance will decrease Outcome: Progressing   Problem: Safety: Goal: Ability to remain free from injury will improve Outcome: Progressing   Problem: Tissue Perfusion: Goal: Adequacy of tissue perfusion will improve Outcome: Progressing

## 2023-07-17 NOTE — Progress Notes (Deleted)
   REFERRING PHYSICIAN:  [facility] [address] [place],  [place] [address]  DOS: 07/14/23 Bilateral frontal burr hole for drainage of subdural hematoma   HISTORY OF PRESENT ILLNESS: Joshua Berry is 2 weeks status post Bilateral frontal burr hole for drainage of subdural hematoma . Given *** on discharge from the hospital.   ***was still in ICU when I did note. Check chart.    PHYSICAL EXAMINATION:  NEUROLOGICAL:  General: In no acute distress.   Awake, alert, oriented to person, place, and time.  Pupils equal round and reactive to light.  Facial tone is symmetric.    Strength: Side Biceps Triceps Deltoid Interossei Grip Wrist Ext. Wrist Flex.  R 5 5 5 5 5 5 5   L 5 5 5 5 5 5 5    Incision c/d/i  Imaging:  Nothing new to review.   Assessment / Plan: Joshua Berry is doing well s/p above surgery. Treatment options reviewed with patient and following plan made:   - We discussed activity escalation and I have advised the patient to lift up to 10 pounds until 6 weeks after surgery (until follow up with Dr. Myer Haff).   - Reviewed wound care.  - Continue current medications including *** - Follow up as scheduled in 4 weeks and prn.   Advised to contact the office if any questions or concerns arise.   Drake Leach PA-C Dept of Neurosurgery

## 2023-07-17 NOTE — Plan of Care (Signed)
  Problem: Clinical Measurements: Goal: Will remain free from infection Outcome: Progressing Goal: Respiratory complications will improve Outcome: Progressing Goal: Cardiovascular complication will be avoided Outcome: Progressing   Problem: Coping: Goal: Level of anxiety will decrease Outcome: Progressing   Problem: Pain Managment: Goal: General experience of comfort will improve Outcome: Progressing   Problem: Safety: Goal: Ability to remain free from injury will improve Outcome: Progressing   Problem: Metabolic: Goal: Ability to maintain appropriate glucose levels will improve Outcome: Progressing

## 2023-07-17 NOTE — Progress Notes (Signed)
  PROGRESS NOTE    Joshua Berry  VHQ:[id number] DOB: [birth year] DOA: 07/14/2023 PCP: [facility]  [id number]  LOS: 3 days   Brief hospital course:   Assessment & Plan: 77 yo M presenting to [facility] ED from [place] assisted living via EMS for evaluation of altered mental status.    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 --s/p plt and FFP.  No need for further supplementation. 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  PMHx: esophageal/gastric varices - Once able to tolerate po's resume outpatient rifaximin & lactulose   OSA Was on CPAP historically but not recently   Hypothyroidism - Once able to take po's will resume outpatient synthroid   Type 2 Diabetes Mellitus Insulin dependent --ACHS and SSI   GERD  --IV PPI BID   Dyslipidemia - Resume outpatient lipitor daily once able to tolerate po's   Acute urinary retention --cont Foley for now   DVT prophylaxis: SCD/Compression stockings Code Status: DNR  Family Communication: wife updated at bedside today Level of care: Stepdown Dispo:   The patient is from: ALF Anticipated d/c is to: SNF rehab Anticipated d/c date is: 1-2 days medical ready   Subjective and Interval History:  Pt's mentation was much better this morning, talking, smiling, asking to eat/drink.  Complained to the RN about right arm/shoulder pain which was not reproducible with passive movement.    Objective: Vitals:   07/17/23 1100 07/17/23 1200 07/17/23 1400 07/17/23 1600  BP: 134/81 127/79 126/65 (!) 125/59  Pulse: 65 (!) 59 60 60  Resp: 11 13 14 12   Temp:  (!) 97.5 F (36.4 C)  98.5 F (36.9 C)  TempSrc:  Axillary   Axillary  SpO2: 97% 98% 94% 98%  Weight:      Height:        Intake/Output Summary (Last 24 hours) at 07/17/2023 1718 Last data filed at 07/17/2023 1700 Gross per 24 hour  Intake 914.13 ml  Output 1277 ml  Net -362.87 ml   Filed Weights   07/15/23 0800 07/16/23 0500 07/17/23 0500  Weight: 102.4 kg 102.7 kg 98.6 kg    Examination:   Constitutional: NAD, AAOx3 HEENT: conjunctivae and lids normal, EOMI CV: No cyanosis.   RESP: normal respiratory effort, on RA Psych: Normal mood and affect.     Data Reviewed: I have personally reviewed labs and imaging studies  Time spent: 35 minutes  [name], MD [facility] If 7PM-7AM, please contact night-coverage 07/17/2023, 5:18 PM

## 2023-07-17 NOTE — TOC Progression Note (Signed)
Transition of Care [facility]) - Progression Note    Patient Details  Name: Joshua Berry MRN: [id number] Date of Birth: 03/26/1946  [facility] CM/SW Contact  Darleene Cleaver, [place] Phone Number: 07/17/2023, 5:57 PM  Clinical Narrative:     CSW received email back from [place] at the [place] in [place].  She said patient is enrolled with the [facility], and to contact them to start the SNF approval process.  CSW sent email to [facility], waiting for response back.  CSW to continue to follow patient's progress throughout discharge planning.     Barriers to Discharge: Continued Medical Work up  Expected Discharge Plan and Services       Living arrangements for the past 2 months: Assisted Living Facility                                       Social Determinants of Health (SDOH) Interventions SDOH Screenings   Food Insecurity: No Food Insecurity (06/17/2023)  Housing: Low Risk  (06/17/2023)  Transportation Needs: Unmet Transportation Needs (06/17/2023)  Utilities: At Risk (06/17/2023)  Depression (PHQ2-9): Low Risk  (04/24/2023)  Social Connections: Unknown (04/29/2022)   Received from [facility]  Tobacco Use: Medium Risk (07/14/2023)    Readmission Risk Interventions    07/15/2023    8:52 AM 06/18/2023   10:19 AM 05/17/2022    1:45 PM  Readmission Risk Prevention Plan  Transportation Screening Complete Complete Complete  PCP or Specialist Appt within 5-7 Days  Complete Complete  PCP or Specialist Appt within 3-5 Days Complete    Home Care Screening  Complete Complete  Medication Review (RN CM)  Complete Complete  Social Work Consult for Recovery Care Planning/Counseling Complete    Palliative Care Screening Not Applicable    Medication Review Oceanographer) Referral to Pharmacy

## 2023-07-17 NOTE — Progress Notes (Signed)
Physical Therapy Treatment Patient Details Name: Joshua Berry MRN: [id number] DOB: August 13, 1946 Today's Date: 07/17/2023   History of Present Illness 77 y/o male presented to ED on 07/14/23 from [facility] for AMS and headache x 4-5 days. CT head showed acute bilateral subdural hematomas with L midline shift. S/p bilateral frontal burr holes on 7/29. PMH: cirrhosis, diabetes, HTN, heart murmur, sleep apnea, hx of melanoma, iron deficiency anemia.    PT Comments  Patient more alert this date compared to previous and agreeable to PT/OT treatment session. Following commands more consistently but with increased time. Required modA+2 for bed mobility and sit to stand with facilitation at low back for upright posture. Complaining of L knee pain with weightbearing. Required modA+2 to transfer to the chair with RW with assist for balance, weight shift to L, and RW management. Discharge plan remains appropriate.      If plan is discharge home, recommend the following: Two people to help with walking and/or transfers;Two people to help with bathing/dressing/bathroom   Can travel by private vehicle     No  Equipment Recommendations  Other (comment) (defer to next venue of care)    Recommendations for Other Services       Precautions / Restrictions Precautions Precautions: Fall Restrictions Weight Bearing Restrictions: No     Mobility  Bed Mobility Overal bed mobility: Needs Assistance Bed Mobility: Supine to Sit     Supine to sit: Mod assist, +2 for physical assistance, +2 for safety/equipment          Transfers Overall transfer level: Needs assistance Equipment used: Rolling Crisanto Nied (2 wheels) Transfers: Sit to/from Stand, Bed to chair/wheelchair/BSC Sit to Stand: Mod assist, +2 physical assistance, +2 safety/equipment   Step pivot transfers: Mod assist, +2 safety/equipment, +2 physical assistance       General transfer comment: required facilitation at low back for upright  posture in standing. Assist for balance, weight shift to L, and Rw management during transfer. Complaining of L knee pain during transfer    Ambulation/Gait                   Stairs             Wheelchair Mobility     Tilt Bed    Modified Rankin (Stroke Patients Only)       Balance Overall balance assessment: Needs assistance Sitting-balance support: Bilateral upper extremity supported, Feet supported Sitting balance-Leahy Scale: Fair     Standing balance support: Bilateral upper extremity supported Standing balance-Leahy Scale: Poor                              Cognition Arousal/Alertness: Awake/alert Behavior During Therapy: WFL for tasks assessed/performed Overall Cognitive Status: Within Functional Limits for tasks assessed                                 General Comments: seems WFL during session        Exercises      General Comments General comments (skin integrity, edema, etc.): VSS on RA      Pertinent Vitals/Pain Pain Assessment Pain Assessment: Faces Faces Pain Scale: Hurts even more Pain Location: L knee Pain Descriptors / Indicators: Grimacing, Guarding Pain Intervention(s): Limited activity within patient's tolerance, Monitored during session, Repositioned    Home Living  Prior Function            PT Goals (current goals can now be found in the care plan section) Acute Rehab PT Goals PT Goal Formulation: With patient/family Time For Goal Achievement: 07/30/23 Potential to Achieve Goals: Fair Progress towards PT goals: Progressing toward goals    Frequency    Min 1X/week      PT Plan Current plan remains appropriate    Co-evaluation PT/OT/SLP Co-Evaluation/Treatment: Yes Reason for Co-Treatment: Complexity of the patient's impairments (multi-system involvement);For patient/therapist safety PT goals addressed during session: Mobility/safety with  mobility;Balance OT goals addressed during session: ADL's and self-care      AM-PAC PT "6 Clicks" Mobility   Outcome Measure  Help needed turning from your back to your side while in a flat bed without using bedrails?: Total Help needed moving from lying on your back to sitting on the side of a flat bed without using bedrails?: Total Help needed moving to and from a bed to a chair (including a wheelchair)?: Total Help needed standing up from a chair using your arms (e.g., wheelchair or bedside chair)?: Total Help needed to walk in hospital room?: Total Help needed climbing 3-5 steps with a railing? : Total 6 Click Score: 6    End of Session   Activity Tolerance: Patient tolerated treatment well Patient left: in chair;with call bell/phone within reach;with family/visitor present Nurse Communication: Mobility status PT Visit Diagnosis: Unsteadiness on feet (R26.81);Muscle weakness (generalized) (M62.81);Other abnormalities of gait and mobility (R26.89);Other symptoms and signs involving the nervous system (R29.898)     Time: 1610-9604 PT Time Calculation (min) (ACUTE ONLY): 26 min  Charges:    $Therapeutic Activity: 8-22 mins PT General Charges $$ ACUTE PT VISIT: 1 Visit                     Maylon Peppers, PT, DPT Physical Therapist - [facility]  [facility]    Davinci Glotfelty A Dolphus Linch 07/17/2023, 12:59 PM

## 2023-07-17 NOTE — Progress Notes (Signed)
Occupational Therapy Treatment Patient Details Name: Joshua Berry MRN: [id number] DOB: June 20, 1946 Today's Date: 07/17/2023   History of present illness 77 y/o male presented to ED on 07/14/23 from [place] for AMS and headache x 4-5 days. CT head showed acute bilateral subdural hematomas with L midline shift. S/p bilateral frontal burr holes on 7/29. PMH: cirrhosis, diabetes, HTN, heart murmur, sleep apnea, hx of melanoma, iron deficiency anemia.   OT comments  Joshua Berry was seen for OT/PT co-treatment on this date. Upon arrival to room pt reclined in bed, A&O x4, agreeable to tx. Pt requires MOD A x2 + RW sit<>stand for 2 trials, assist to achieve upright posture. MOD A x2 + RW bed>chair step pivot t/f. MAX A don B socks seated EOB. SETUP seated tooth brushing and eating ice chips. Reports 6/10 L knee pain limiting progress, RN notified. Pt making good progress toward goals, will continue to follow POC. Discharge recommendation remains appropriate.     Recommendations for follow up therapy are one component of a multi-disciplinary discharge planning process, led by the attending physician.  Recommendations may be updated based on patient status, additional functional criteria and insurance authorization.    Assistance Recommended at Discharge Frequent or constant Supervision/Assistance  Patient can return home with the following  Two people to help with walking and/or transfers;Two people to help with bathing/dressing/bathroom;Help with stairs or ramp for entrance   Equipment Recommendations  Other (comment) (defer to next venue of care)    Recommendations for Other Services      Precautions / Restrictions Precautions Precautions: Fall Restrictions Weight Bearing Restrictions: No       Mobility Bed Mobility Overal bed mobility: Needs Assistance Bed Mobility: Supine to Sit     Supine to sit: Mod assist, HOB elevated          Transfers Overall transfer level: Needs  assistance Equipment used: Rolling walker (2 wheels) Transfers: Sit to/from Stand, Bed to chair/wheelchair/BSC Sit to Stand: Mod assist, +2 physical assistance     Step pivot transfers: Mod assist, +2 physical assistance           Balance Overall balance assessment: Needs assistance Sitting-balance support: Feet supported, No upper extremity supported Sitting balance-Leahy Scale: Fair     Standing balance support: Bilateral upper extremity supported Standing balance-Leahy Scale: Poor                             ADL either performed or assessed with clinical judgement   ADL Overall ADL's : Needs assistance/impaired                                       General ADL Comments: MOD A x2 + RW for simulated BSC t/f. MAX A don B socks seated EOB. SETUP seated tooth brushing and eating ice chips      Cognition Arousal/Alertness: Awake/alert Behavior During Therapy: WFL for tasks assessed/performed Overall Cognitive Status: Within Functional Limits for tasks assessed                                 General Comments: A&O x4, cues for transfer however significantly improved insight into deficits and safety this date         Pertinent Vitals/ Pain       Pain Assessment Pain  Assessment: 0-10 Pain Score: 6  Pain Location: L knee Pain Descriptors / Indicators: Discomfort, Grimacing Pain Intervention(s): Limited activity within patient's tolerance, Patient requesting pain meds-RN notified   Frequency  Min 1X/week        Progress Toward Goals  OT Goals(current goals can now be found in the care plan section)  Progress towards OT goals: Progressing toward goals  Acute Rehab OT Goals Patient Stated Goal: to return to PLOF OT Goal Formulation: With patient/family Time For Goal Achievement: 07/30/23 Potential to Achieve Goals: Fair ADL Goals Pt Will Perform Grooming: with modified independence;sitting Pt Will Perform Lower Body  Dressing: with min assist;sit to/from stand Pt Will Transfer to Toilet: with mod assist;stand pivot transfer;bedside commode  Plan Discharge plan remains appropriate;Frequency remains appropriate    Co-evaluation    PT/OT/SLP Co-Evaluation/Treatment: Yes Reason for Co-Treatment: Complexity of the patient's impairments (multi-system involvement);For patient/therapist safety PT goals addressed during session: Mobility/safety with mobility OT goals addressed during session: ADL's and self-care      AM-PAC OT "6 Clicks" Daily Activity     Outcome Measure   Help from another person eating meals?: A Little Help from another person taking care of personal grooming?: A Little Help from another person toileting, which includes using toliet, bedpan, or urinal?: A Lot Help from another person bathing (including washing, rinsing, drying)?: A Lot Help from another person to put on and taking off regular upper body clothing?: A Little Help from another person to put on and taking off regular lower body clothing?: A Lot 6 Click Score: 15    End of Session Equipment Utilized During Treatment: Rolling walker (2 wheels)  OT Visit Diagnosis: Other abnormalities of gait and mobility (R26.89);Muscle weakness (generalized) (M62.81)   Activity Tolerance Patient tolerated treatment well   Patient Left in chair;with call bell/phone within reach;with family/visitor present   Nurse Communication Mobility status        Time: 1610-9604 OT Time Calculation (min): 32 min  Charges: OT General Charges $OT Visit: 1 Visit OT Treatments $Self Care/Home Management : 8-22 mins  Kathie Dike, M.S. OTR/L  07/17/23, 12:57 PM  ascom [phone]

## 2023-07-17 NOTE — Progress Notes (Signed)
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 Repeat head CT today yesterday improved from pre-op, with slight increase in acute subdural blood on L and stable intracranial air from prior, left to right shift of 1-39mm. rEEG showed no epileptiform abnl but suspicion remains high that AMS was 2/2 intermittent nonconvulsive seizure activity.   - No further neurologic workup indicated at this time - Continue keppra at discharge - F/u with neurology and neurosurgery - Neurology will be available prn for questions going forward  Bing Neighbors, MD [facility] [phone]  If 7pm- 7am, please page neurology on call as listed in AMION.

## 2023-07-17 NOTE — Progress Notes (Signed)
Speech Language Pathology Treatment: Dysphagia  Patient Details Name: Joshua Berry MRN: [id number] DOB: Apr 25, 1946 Today's Date: 07/17/2023 Time: 1240-1305 SLP Time Calculation (min) (ACUTE ONLY): 25 min  Assessment / Plan / Recommendation Clinical Impression  Pt seen for clinical swallowing re-evaluation at the request of Dr. Fran Lowes. Pt alert and cooperative. Wife and stepson present. Pt presents with s/sx mild-moderate oral dysphagia c/b prolonged, inefficient mastication of solids. Per wife, pt consumed a naturally soft diet PTA. Pharyngeally, x1 immediate cough noted on first sip of thin liquids via straw. No other overt s/sx noted. Recommend cautious initiation of a Dysphagia 2 Diet with Thin Liquids with safe swallowing strategies/aspiration precautions as outlined below. Pt is at increased risk for aspiration/aspiration PNA given fluctuations in mental status, dentition, and medical comorbidities. SLP to f/u next date for tolerance. Should pt exhibit any increased difficulty chewing/swallowing, changes to lung status concerning for aspiration/aspiration PNA, or changes to mental status, please make pt NPO and notify SLP.    HPI HPI: Pt presented to ED on 07/14/23 with a chief complaint of altered mental status and "terrible" headaches. Workup revealed B SDH, pt now s/p B burr holes on 7/29. PMHx history of diabetes, hypertension, and cirrhosis. Head CT, 7/31, "The amount of intracranial air is stable or slightly  diminished. Both subdural drains have been removed. On the right,  the subdural hematoma is stable, maximal thickness 7 mm. On the  left, the subdural blood and fluid is overall stable, but there is a  slight increase in acute subdural blood at about the level of the  left-sided burr hole. If this does not continue to increase, it is  probably not significant, but certainly this should be watched.  There is left-to-right shift of 1-2 mm. No hydrocephalus. No  intraparenchymal hemorrhage.  Subdural blood along the tentorium is  stable." CXR, 7/30, "1. Endotracheal tube, if present, is not identified within the  visualized portions of the neck and chest.  2. Pulmonary vascular congestion without overt edema."      SLP Plan  Continue with current plan of care (cognitive-linguistic evaluation)      Recommendations for follow up therapy are one component of a multi-disciplinary discharge planning process, led by the attending physician.  Recommendations may be updated based on patient status, additional functional criteria and insurance authorization.    Recommendations  Diet recommendations: Dysphagia 2 (fine chop);Thin liquid Liquids provided via: Cup;Straw Medication Administration: Crushed with puree Supervision: Patient able to self feed;Full supervision/cueing for compensatory strategies Compensations: Minimize environmental distractions;Slow rate;Small sips/bites Postural Changes and/or Swallow Maneuvers: Seated upright 90 degrees;Upright 30-60 min after meal                  Oral care QID;Staff/trained caregiver to provide oral care   Frequent or constant Supervision/Assistance Dysphagia, oropharyngeal phase (R13.12)     Continue with current plan of care (cognitive-linguistic evaluation)    Clyde Canterbury, M.S., CCC-SLP Speech-Language Pathologist [facility] [phone] (ASCOM)  Woodroe Chen  07/17/2023, 1:13 PM

## 2023-07-17 NOTE — Progress Notes (Addendum)
   Neurosurgery Progress Note  History: Joshua Berry is a 77 year old presenting with bilateral subdural hematomas status post bilateral bur hole washout.  POD3: Pt complaining of headache this morning POD2: Patient is not complaining, but having less speech output. POD1: Patient have some improvement in his headaches and neurologic status overnight.  Physical Exam: Vitals:   07/17/23 0600 07/17/23 0700  BP: 136/65 137/65  Pulse: (!) 57 64  Resp: 13 17  Temp:    SpO2: 100% 100%    Drowsy but arouses to voice. Oriented to self and place. No overt drift. PERRL. Following commands x 4  Data: 07/16/23 CT head  IMPRESSION: 1. Both subdural drains have been removed. The amount of intracranial air is stable or slightly diminished. 2. On the right, the subdural hematoma is stable, maximal thickness 7 mm. 3. On the left, the subdural blood and fluid is overall stable, but there is a slight increase in acute subdural blood at about the level of the left-sided burr hole. If this does not continue to increase, it is probably not significant, but certainly this should be watched. There is left-to-right shift of 1-2 mm.     Electronically Signed   By: Paulina Fusi M.D.   On: 07/16/2023 11:07 Other tests/results:   Assessment/Plan:  Joshua Berry is a 77 year old presenting with altered mental status and headaches found to have bilateral subdural hematomas status post bilateral bur hole for washout.  He has had a setback in his current mental status.  His drains have transitioned to draining spinal fluid - I have removed both drains and redressed his incisions.   - q2 hour neuro checks - Neurology consulted for AMS - OK to work with PTOTST if mental status allows and CT is reassuring - remainder of care per CC.  Manning Charity PA-C Department of Neurosurgery

## 2023-07-17 NOTE — Evaluation (Signed)
Clinical/Bedside Swallow Evaluation Patient Details  Name: Joshua Berry MRN: [id number] Date of Birth: 08-Sep-1946  Today's Date: 07/17/2023 Time: SLP Start Time (ACUTE ONLY): 0810 SLP Stop Time (ACUTE ONLY): 0825 SLP Time Calculation (min) (ACUTE ONLY): 15 min  Past Medical History:  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   Past Surgical History:  Past Surgical History:  Procedure Laterality Date   BURR HOLE Bilateral 07/14/2023   Procedure: BURR HOLES;  Surgeon: Venetia Night, MD;  Location: [facility] ORS;  Service: Neurosurgery;  Laterality: Bilateral;   CHOLECYSTECTOMY     COLONOSCOPY N/A 07/23/2019   Procedure: COLONOSCOPY;  Surgeon: Toney Reil, MD;  Location: [facility] ENDOSCOPY;  Service: Gastroenterology;  Laterality: N/A;   ESOPHAGOGASTRODUODENOSCOPY N/A 07/20/2019   Procedure: ESOPHAGOGASTRODUODENOSCOPY (EGD);  Surgeon: Toney Reil, MD;  Location: [facility] ENDOSCOPY;  Service: Gastroenterology;  Laterality: N/A;   ESOPHAGOGASTRODUODENOSCOPY Left 08/20/2019   Procedure: ESOPHAGOGASTRODUODENOSCOPY (EGD);  Surgeon: Pasty Spillers, MD;  Location: [facility] ENDOSCOPY;  Service: Endoscopy;  Laterality: Left;   ESOPHAGOGASTRODUODENOSCOPY (EGD) WITH PROPOFOL N/A 04/25/2021   Procedure: ESOPHAGOGASTRODUODENOSCOPY (EGD) WITH PROPOFOL;  Surgeon: Toney Reil, MD;  Location: [facility] ENDOSCOPY;  Service: Gastroenterology;  Laterality: N/A;   MECHANICAL AORTIC VALVE REPLACEMENT     SKIN BIOPSY     TEE WITHOUT CARDIOVERSION N/A 12/23/2018   Procedure: TRANSESOPHAGEAL ECHOCARDIOGRAM (TEE);  Surgeon: Dalia Heading, MD;   Location: [facility] ORS;  Service: Cardiovascular;  Laterality: N/A;   TEE WITHOUT CARDIOVERSION N/A 05/21/2022   Procedure: TRANSESOPHAGEAL ECHOCARDIOGRAM (TEE);  Surgeon: Antonieta Iba, MD;  Location: [facility] ORS;  Service: Cardiovascular;  Laterality: N/A;   TEE WITHOUT CARDIOVERSION N/A 04/09/2023   Procedure: TRANSESOPHAGEAL ECHOCARDIOGRAM;  Surgeon: Debbe Odea, MD;  Location: [facility] ORS;  Service: Cardiovascular;  Laterality: N/A;   HPI:  Pt presented to ED on 07/14/23 with a chief complaint of altered mental status and "terrible" headaches. Workup revealed B SDH, pt now s/p B burr holes on 7/29. PMHx history of diabetes, hypertension, and cirrhosis. Head CT, 7/31, "The amount of intracranial air is stable or slightly  diminished. Both subdural drains have been removed. On the right,  the subdural hematoma is stable, maximal thickness 7 mm. On the  left, the subdural blood and fluid is overall stable, but there is a  slight increase in acute subdural blood at about the level of the  left-sided burr hole. If this does not continue to increase, it is  probably not significant, but certainly this should be watched.  There is left-to-right shift of 1-2 mm. No hydrocephalus. No  intraparenchymal hemorrhage. Subdural blood along the tentorium is  stable." CXR, 7/30, "1. Endotracheal tube, if present, is not identified within the  visualized portions of the neck and chest.  2. Pulmonary vascular congestion without overt edema."    Assessment / Plan / Recommendation  Clinical Impression  Pt seen for clinical swallowing evaluation. Pt lethargic, able to rouse to voice. Slow to respond. Oriented to self, "hospital" and year. Loosely to situation ("surgery"). Subtle wet  vocal quality appreciated prior to POs suggestive of reduced secretion management. Improved with spontaneous throat clearing. Yankauer at bedside. Pt presents with s/sx concerning for oropharyngeal dysphagia. Pt with reduced bolus awareness  across all consistencies requiring verbal cues to accept POs (e.g. open mouth, close mouth). Pt with reduced labial seal resulting in intermittently difficulty siphoning liquids from liquids. Pt with prolonged mastication of ice chips. Pharyngeally, pt with secondary swallows appreciated with ice chips and immediate coughing with tsp and straw sips of H2O. Pt's swallow function likely impacted by AMS and overall LOA. SLP to f/u next date for re-evaluation. In the interim, recommend NPO x ice chips for comfort. Critical meds may be given crushed in puree, as able. Pt will require full supervision/assistance with POs.  SLP Visit Diagnosis: Dysphagia, oropharyngeal phase (R13.12)    Aspiration Risk  Moderate aspiration risk;Severe aspiration risk    Diet Recommendation NPO;Ice chips PRN after oral care    Medication Administration: Crushed with puree (critical meds) Supervision: Full supervision/cueing for compensatory strategies (for ice chips and meds) Compensations: Minimize environmental distractions;Slow rate;Small sips/bites Postural Changes: Seated upright at 90 degrees    Other  Recommendations Oral Care Recommendations: Oral care QID;Staff/trained caregiver to provide oral care Caregiver Recommendations: Have oral suction available;Remove water pitcher    Recommendations for follow up therapy are one component of a multi-disciplinary discharge planning process, led by the attending physician.  Recommendations may be updated based on patient status, additional functional criteria and insurance authorization.  Follow up Recommendations  (TBD)      Assistance Recommended at Discharge    Functional Status Assessment Patient has had a recent decline in their functional status and demonstrates the ability to make significant improvements in function in a reasonable and predictable amount of time.  Frequency and Duration min 2x/week  2 weeks       Prognosis Prognosis for improved  oropharyngeal function: Fair Barriers to Reach Goals: Cognitive deficits;Severity of deficits      Swallow Study   General Date of Onset: 07/14/23 HPI: Pt presented to ED on 07/14/23 with a chief complaint of altered mental status and "terrible" headaches. Workup revealed B SDH, pt now s/p B burr holes on 7/29. PMHx history of diabetes, hypertension, and cirrhosis. Head CT, 7/31, "The amount of intracranial air is stable or slightly  diminished. Both subdural drains have been removed. On the right,  the subdural hematoma is stable, maximal thickness 7 mm. On the  left, the subdural blood and fluid is overall stable, but there is a  slight increase in acute subdural blood at about the level of the  left-sided burr hole. If this does not continue to increase, it is  probably not significant, but certainly this should be watched.  There is left-to-right shift of 1-2 mm. No hydrocephalus. No  intraparenchymal hemorrhage. Subdural blood along the tentorium is  stable." CXR, 7/30, "1. Endotracheal tube, if present, is not identified within the  visualized portions of the neck and chest.  2. Pulmonary vascular congestion without overt edema." Type of Study: Bedside Swallow Evaluation Previous Swallow Assessment: none; on Dysphagia 3 and Thin during admission earlier this month Diet Prior to this Study: NPO Respiratory Status: Nasal cannula History of Recent Intubation: Yes Total duration of intubation (days): 2 days Date extubated: 07/15/23 Behavior/Cognition: Lethargic/Drowsy Oral Cavity Assessment: Within Functional Limits Oral Care Completed by SLP: Yes Oral Cavity - Dentition: Missing dentition Vision:  (pt did not attempt to feed self) Self-Feeding Abilities: Total assist  Patient Positioning: Upright in bed Baseline Vocal Quality: Normal;Wet (occasional, minimally wet vocal quality; suggestive of reduced secretion management) Volitional Cough: Cognitively unable to elicit Volitional Swallow:  Unable to elicit    Oral/Motor/Sensory Function Overall Oral Motor/Sensory Function:  (unable to follow commands; no obvious deficits)   Ice Chips Ice chips: Impaired Presentation: Spoon Oral Phase Impairments: Impaired mastication;Poor awareness of bolus Oral Phase Functional Implications: Prolonged oral transit (prolonged mastication; verbal cues to accept PO) Pharyngeal Phase Impairments: Multiple swallows   Thin Liquid Thin Liquid: Impaired Presentation: Spoon Oral Phase Impairments: Reduced labial seal;Poor awareness of bolus Oral Phase Functional Implications:  (inconsistent ability to siphon liquids from straw; cues to take PO) Pharyngeal  Phase Impairments: Cough - Immediate    Nectar Thick Nectar Thick Liquid: Not tested   Honey Thick Honey Thick Liquid: Not tested   Puree Puree: Impaired Presentation: Spoon Oral Phase Impairments: Poor awareness of bolus Oral Phase Functional Implications: Prolonged oral transit Pharyngeal Phase Impairments:  (WFL)   Solid     Solid: Not tested     Clyde Canterbury, M.S., CCC-SLP Speech-Language Pathologist  [facility] [phone] (ASCOM)  Alessandra Bevels Lola Czerwonka 07/17/2023,9:49 AM

## 2023-07-17 NOTE — Progress Notes (Signed)
Nutrition Follow Up Note   DOCUMENTATION CODES:   Not applicable  INTERVENTION:   Recommend NGT placement and nutrition support if pt's mental status declines and he is unable to take in oral intake   Ensure Enlive po TID, each supplement provides 350 kcal and 20 grams of protein.  Magic cup TID with meals, each supplement provides 290 kcal and 9 grams of protein  MVI po daily   Pt at high refeed risk; recommend monitor potassium, magnesium and phosphorus labs daily until stable  Daily weights   NUTRITION DIAGNOSIS:   Inadequate oral intake related to acute illness as evidenced by per patient/family report. 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  Pt remains lethargic and is unable to provide any nutrition related history. Pt seen by SLP today and was initiated on a dysphagia 2/thin liquid diet. RD will add supplements and MVI to help pt meet his estimated needs. Pt is at high refeed risk. Pt's mental status has waxed and waned; if pt's mental status declines and he is unable to take in po intake, recommend NGT and nutrition support; this was discussed with SLP and MD. Per chart, pt is down ~6lbs(2%) since admission. Drains removed.     Medications reviewed and include: insulin, protonix, vitamin K  Labs reviewed: K 3.8 wnl, BUN 26(H), P 3.3 wnl, Mg 2.0 wnl Ammonia- 41(H)- 7/31 Hgb 9.6(L), Hct 26.8(L) Cbgs- 125, 121, 123 x 24 hrs  Diet Order:   Diet Order             DIET DYS 2 Room service appropriate? Yes with Assist; Fluid consistency: Thin  Diet  effective now                  EDUCATION NEEDS:   No education needs have been identified at this time  Skin:  Skin Assessment: Reviewed RN Assessment (incision head)  Last BM:  7/29  Height:   Ht Readings from Last 1 Encounters:  07/14/23 6\' 2"  (1.88 m)    Weight:   Wt Readings from Last 1 Encounters:  07/17/23 98.6 kg    Ideal Body Weight:  86.3 kg  BMI:  Body mass index is 27.91 kg/m.  Estimated Nutritional Needs:   Kcal:  2000-2300kcal/day  Protein:  100-115g/day  Fluid:  2.0-2.3L/day  Betsey Holiday MS, RD, LDN Please refer to [facility] for RD and/or RD on-call/weekend/after hours pager

## 2023-07-17 NOTE — Progress Notes (Signed)
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   Goal of Therapy:  Electrolytes WNL  Plan:  ---no electrolyte replacement warranted for today ---recheck electrolytes in am  Lowella Bandy ,PharmD Clinical Pharmacist 07/17/2023 7:07 AM

## 2023-07-18 DIAGNOSIS — S065XAA Traumatic subdural hemorrhage with loss of consciousness status unknown, initial encounter: Secondary | ICD-10-CM | POA: Diagnosis not present

## 2023-07-18 LAB — GLUCOSE, CAPILLARY
Glucose-Capillary: 126 mg/dL — ABNORMAL HIGH (ref 70–99)
Glucose-Capillary: 115 mg/dL — ABNORMAL HIGH (ref 70–99)
Glucose-Capillary: 175 mg/dL — ABNORMAL HIGH (ref 70–99)
Glucose-Capillary: 141 mg/dL — ABNORMAL HIGH (ref 70–99)
Glucose-Capillary: 142 mg/dL — ABNORMAL HIGH (ref 70–99)

## 2023-07-18 MED ORDER — LEVETIRACETAM 500 MG PO TABS: 500.0000 mg | ORAL_TABLET | Freq: Two times a day (BID) | ORAL | Status: DC

## 2023-07-18 MED ORDER — LEVETIRACETAM 500 MG PO TABS
500.0000 mg | ORAL_TABLET | Freq: Two times a day (BID) | ORAL | Status: DC
  Administered 2023-07-18 – 2023-07-21 (×6): 500 mg via ORAL
  Filled 2023-07-18 (×6): qty 1

## 2023-07-18 MED ORDER — MELATONIN 5 MG PO TABS
10.0000 mg | ORAL_TABLET | Freq: Every day | ORAL | Status: DC
  Administered 2023-07-18 – 2023-07-25 (×5): 10 mg via ORAL
  Filled 2023-07-18 (×6): qty 2

## 2023-07-18 MED ORDER — RIFAXIMIN 550 MG PO TABS
550.0000 mg | ORAL_TABLET | Freq: Two times a day (BID) | ORAL | Status: DC
  Administered 2023-07-18 – 2023-08-01 (×26): 550 mg via ORAL
  Filled 2023-07-18 (×26): qty 1

## 2023-07-18 MED ORDER — TERAZOSIN HCL 5 MG PO CAPS
5.0000 mg | ORAL_CAPSULE | Freq: Every day | ORAL | Status: DC
  Administered 2023-07-18 – 2023-07-31 (×12): 5 mg via ORAL
  Filled 2023-07-18 (×14): qty 1

## 2023-07-18 MED ORDER — PANTOPRAZOLE SODIUM 40 MG PO TBEC
40.0000 mg | DELAYED_RELEASE_TABLET | Freq: Two times a day (BID) | ORAL | Status: DC
  Administered 2023-07-19 – 2023-08-01 (×24): 40 mg via ORAL
  Filled 2023-07-18 (×25): qty 1

## 2023-07-18 MED ORDER — PANTOPRAZOLE SODIUM 40 MG PO TBEC: 40.0000 mg | DELAYED_RELEASE_TABLET | Freq: Two times a day (BID) | ORAL | Status: DC

## 2023-07-18 MED ORDER — ACETAMINOPHEN 500 MG PO TABS
1000.0000 mg | ORAL_TABLET | Freq: Three times a day (TID) | ORAL | Status: DC | PRN
  Administered 2023-07-18 – 2023-07-21 (×6): 1000 mg via ORAL
  Filled 2023-07-18 (×6): qty 2

## 2023-07-18 MED ORDER — LACTULOSE 10 GM/15ML PO SOLN
30.0000 g | Freq: Three times a day (TID) | ORAL | Status: DC
  Administered 2023-07-18 – 2023-07-19 (×3): 30 g via ORAL
  Filled 2023-07-18 (×3): qty 60

## 2023-07-18 MED ORDER — LEVOTHYROXINE SODIUM 100 MCG PO TABS
100.0000 ug | ORAL_TABLET | Freq: Every day | ORAL | Status: DC
  Administered 2023-07-19 – 2023-08-01 (×12): 100 ug via ORAL
  Filled 2023-07-18 (×12): qty 1

## 2023-07-18 NOTE — Progress Notes (Signed)
Speech Language Pathology Treatment: Dysphagia  Patient Details Name: Joshua Berry MRN: [id number] DOB: 03/23/1946 Today's Date: 07/18/2023 Time: 4332-9518 SLP Time Calculation (min) (ACUTE ONLY): 10 min  Assessment / Plan / Recommendation Clinical Impression  Follow up education completed with stepson and family at bedside. Pt somnolent due to medication per family. PO trials and cognitive-linguistic evaluation deferred at this time. Per family report, pt tolerating current diet without overt s/sx pharyngeal dysphagia. Good PO intake. Family reports pt requiring extra time to respond; however, speech is clear and pt's behavior is "more like himself." Reviewed diet recommendations, safe swallowing strategies/aspiration precautions, s/sx pharyngeal dysphagia to be mindful of, basic strategies to improve cognition/prevent delirium, and SLP POC. Family verbalized understanding/agreement. SLP to continue to f/u for diet tolerance, trials of upgraded textures, and cognitive-linguistic evaluation.    HPI HPI: Pt presented to ED on 07/14/23 with a chief complaint of altered mental status and "terrible" headaches. Workup revealed B SDH, pt now s/p B burr holes on 7/29. PMHx history of diabetes, hypertension, and cirrhosis. Head CT, 7/31, "The amount of intracranial air is stable or slightly  diminished. Both subdural drains have been removed. On the right,  the subdural hematoma is stable, maximal thickness 7 mm. On the  left, the subdural blood and fluid is overall stable, but there is a  slight increase in acute subdural blood at about the level of the  left-sided burr hole. If this does not continue to increase, it is  probably not significant, but certainly this should be watched.  There is left-to-right shift of 1-2 mm. No hydrocephalus. No  intraparenchymal hemorrhage. Subdural blood along the tentorium is  stable." CXR, 7/30, "1. Endotracheal tube, if present, is not identified within the  visualized  portions of the neck and chest.  2. Pulmonary vascular congestion without overt edema."      SLP Plan  Continue with current plan of care (cognitive-linguistic evaluation pending)      Recommendations for follow up therapy are one component of a multi-disciplinary discharge planning process, led by the attending physician.  Recommendations may be updated based on patient status, additional functional criteria and insurance authorization.    Recommendations  Diet recommendations: Dysphagia 2 (fine chop);Thin liquid Liquids provided via: Cup;Straw Medication Administration: Crushed with puree Supervision: Patient able to self feed;Full supervision/cueing for compensatory strategies Compensations: Minimize environmental distractions;Slow rate;Small sips/bites Postural Changes and/or Swallow Maneuvers: Seated upright 90 degrees;Upright 30-60 min after meal                  Oral care QID;Staff/trained caregiver to provide oral care   Frequent or constant Supervision/Assistance Dysphagia, oropharyngeal phase (R13.12)     Continue with current plan of care (cognitive-linguistic evaluation pending)    Clyde Canterbury, M.S., CCC-SLP Speech-Language Pathologist [facility] [phone] (ASCOM)  Woodroe Chen  07/18/2023, 11:13 AM

## 2023-07-18 NOTE — Progress Notes (Signed)
Occupational Therapy Treatment Patient Details Name: Joshua Berry MRN: [id number] DOB: 11-13-1946 Today's Date: 07/18/2023   History of present illness 77 y/o male presented to ED on 07/14/23 from [place] for AMS and headache x 4-5 days. CT head showed acute bilateral subdural hematomas with L midline shift. S/p bilateral frontal burr holes on 7/29. PMH: cirrhosis, diabetes, HTN, heart murmur, sleep apnea, hx of melanoma, iron deficiency anemia.   OT comments  Mr Hochstetler was seen for OT treatment on this date. Upon arrival to room pt reclined in bed, spouse at bed side encouraging pt to participate, pt agreeable to seated tx. Pt requires MOD A sup<>sit - assist for trunk exiting and BLE returning to bed. Fair static sitting balance; tolerates seated BLE therex as described below. MAX A lateral scoot along EOB. Pt making good progress toward goals, will continue to follow POC. Discharge recommendation remains appropriate.     Recommendations for follow up therapy are one component of a multi-disciplinary discharge planning process, led by the attending physician.  Recommendations may be updated based on patient status, additional functional criteria and insurance authorization.    Assistance Recommended at Discharge Frequent or constant Supervision/Assistance  Patient can return home with the following  Two people to help with walking and/or transfers;Two people to help with bathing/dressing/bathroom;Help with stairs or ramp for entrance   Equipment Recommendations  Other (comment) (defer)    Recommendations for Other Services      Precautions / Restrictions Precautions Precautions: Fall Restrictions Weight Bearing Restrictions: No       Mobility Bed Mobility Overal bed mobility: Needs Assistance Bed Mobility: Supine to Sit, Sit to Supine     Supine to sit: Mod assist, HOB elevated Sit to supine: Mod assist   General bed mobility comments: assist for trunk exiting and BLE  returning to bed    Transfers Overall transfer level: Needs assistance   Transfers: Bed to chair/wheelchair/BSC            Lateral/Scoot Transfers: Max assist General transfer comment: along EOB     Balance Overall balance assessment: Needs assistance Sitting-balance support: Bilateral upper extremity supported, Feet supported Sitting balance-Leahy Scale: Fair                                     ADL either performed or assessed with clinical judgement   ADL Overall ADL's : Needs assistance/impaired                                       General ADL Comments: MAX A don B socks at bed level      Cognition Arousal/Alertness: Awake/alert Behavior During Therapy: WFL for tasks assessed/performed Overall Cognitive Status: Within Functional Limits for tasks assessed                                          Exercises Exercises: General Lower Extremity General Exercises - Lower Extremity Gluteal Sets: AROM, Strengthening, Both, 10 reps, Seated Long Arc Quad: AROM, Strengthening, Both, 10 reps, Seated Hip ABduction/ADduction: AROM, Strengthening, Both, 10 reps, Seated Hip Flexion/Marching: AROM, Strengthening, Both, 10 reps, Seated Toe Raises: AROM, Strengthening, Both, 10 reps, Seated Heel Raises: AROM, Strengthening, Both, 10 reps, Seated  Pertinent Vitals/ Pain       Pain Assessment Pain Assessment: No/denies pain   Frequency  Min 1X/week        Progress Toward Goals  OT Goals(current goals can now be found in the care plan section)  Progress towards OT goals: Progressing toward goals  Acute Rehab OT Goals Patient Stated Goal: to return to PLOF OT Goal Formulation: With patient/family Time For Goal Achievement: 07/30/23 Potential to Achieve Goals: Fair ADL Goals Pt Will Perform Grooming: with modified independence;sitting Pt Will Perform Lower Body Dressing: with min assist;sit to/from  stand Pt Will Transfer to Toilet: with mod assist;stand pivot transfer;bedside commode  Plan Discharge plan remains appropriate;Frequency remains appropriate    Co-evaluation                 AM-PAC OT "6 Clicks" Daily Activity     Outcome Measure   Help from another person eating meals?: None Help from another person taking care of personal grooming?: A Little Help from another person toileting, which includes using toliet, bedpan, or urinal?: A Lot Help from another person bathing (including washing, rinsing, drying)?: A Lot Help from another person to put on and taking off regular upper body clothing?: A Little Help from another person to put on and taking off regular lower body clothing?: A Lot 6 Click Score: 16    End of Session    OT Visit Diagnosis: Other abnormalities of gait and mobility (R26.89);Muscle weakness (generalized) (M62.81)   Activity Tolerance Patient tolerated treatment well   Patient Left in bed;with call bell/phone within reach;with family/visitor present   Nurse Communication          Time: 1610-9604 OT Time Calculation (min): 18 min  Charges: OT General Charges $OT Visit: 1 Visit OT Treatments $Therapeutic Exercise: 8-22 mins  Kathie Dike, M.S. OTR/L  07/18/23, 3:26 PM  ascom [phone]

## 2023-07-18 NOTE — Progress Notes (Signed)
  PROGRESS NOTE    Joshua Berry  ZHY:[id number] DOB: 03/26/1946 DOA: 07/14/2023 PCP: [facility]  103A/103A-AA  LOS: 4 days   Brief hospital course:   Assessment & Plan: 77 yo M presenting to [facility] ED from [place] assisted living via EMS for evaluation of altered mental status.    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 --s/p plt and FFP.  No need for further supplementation. 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  PMHx: esophageal/gastric varices - resume home rifaximin and lactulose today   OSA Was on CPAP historically but not recently   Hypothyroidism --resume home Synthroid   Type 2 Diabetes Mellitus --ACHS and SSI   GERD  --resume home PPI   Dyslipidemia --resume home statin after discharge  Acute urinary retention --cont Foley for now --voiding trial in a few days   DVT prophylaxis: SCD/Compression stockings Code Status: DNR  Family Communication: family updated at bedside today Level of care: Med-Surg Dispo:   The patient is from: ALF Anticipated d/c is to: SNF rehab Anticipated d/c date is: whenever bed available   Subjective and Interval History:  Pt had chronic right shoulder pain.  Left hand tremor noted yesterday had resolved today.  No issue with oral intake.   Objective: Vitals:   07/18/23 1200 07/18/23 1300 07/18/23 1400 07/18/23 1611  BP: 123/63 (!) 122/59 113/61 135/65  Pulse: (!) 51 (!) 58 (!) 59 (!) 51  Resp: 18 12 16 15   Temp: 98.6 F (37 C)   98.8 F (37.1 C)  TempSrc: Oral     SpO2: 97% 94% 95% 97%  Weight:      Height:        Intake/Output Summary (Last 24 hours) at 07/18/2023 1704 Last data filed  at 07/18/2023 1400 Gross per 24 hour  Intake 1295 ml  Output 1220 ml  Net 75 ml   Filed Weights   07/16/23 0500 07/17/23 0500 07/18/23 0440  Weight: 102.7 kg 98.6 kg 100.6 kg    Examination:   Constitutional: NAD, AAOx3 HEENT: conjunctivae and lids normal, EOMI CV: No cyanosis.   RESP: normal respiratory effort, on RA Neuro: II - XII grossly intact.     Data Reviewed: I have personally reviewed labs and imaging studies  Time spent: 35 minutes  [name], MD [facility] If 7PM-7AM, please contact night-coverage 07/18/2023, 5:04 PM

## 2023-07-18 NOTE — Progress Notes (Signed)
PT Cancellation Note  Patient Details Name: Joshua Berry MRN: [id number] DOB: 10/09/1946   Cancelled Treatment:    Reason Eval/Treat Not Completed: Fatigue/lethargy limiting ability to participate Patient lethargic on arrival, recently received morphine. Will re-attempt at later date/time.   Maylon Peppers, PT, DPT Physical Therapist - [place]  [facility]     A  07/18/2023, 11:03 AM

## 2023-07-18 NOTE — TOC Progression Note (Signed)
Transition of Care [facility]) - Progression Note    Patient Details  Name: MC BLOODWORTH MRN: [id number] Date of Birth: 1946-01-18  [facility]) CM/SW Contact  Darleene Cleaver, [place] Phone Number: 07/18/2023, 9:20 AM  Clinical Narrative:     CSW left message on [place] voice mail to find out who patient's social worker is.  Awaiting for a call back.    Barriers to Discharge: Continued Medical Work up  Expected Discharge Plan and Services       Living arrangements for the past 2 months: Assisted Living Facility                                       Social Determinants of Health (SDOH) Interventions SDOH Screenings   Food Insecurity: No Food Insecurity (06/17/2023)  Housing: Low Risk  (06/17/2023)  Transportation Needs: Unmet Transportation Needs (06/17/2023)  Utilities: At Risk (06/17/2023)  Depression (PHQ2-9): Low Risk  (04/24/2023)  Social Connections: Unknown (04/29/2022)   Received from [facility], [facility]  Tobacco Use: Medium Risk (07/14/2023)    Readmission Risk Interventions    07/15/2023    8:52 AM 06/18/2023   10:19 AM 05/17/2022    1:45 PM  Readmission Risk Prevention Plan  Transportation Screening Complete Complete Complete  PCP or Specialist Appt within 5-7 Days  Complete Complete  PCP or Specialist Appt within 3-5 Days Complete    Home Care Screening  Complete Complete  Medication Review (RN CM)  Complete Complete  Social Work Consult for Recovery Care Planning/Counseling Complete    Palliative Care Screening Not Applicable    Medication Review Oceanographer) Referral to Pharmacy

## 2023-07-18 NOTE — Consult Note (Signed)
PHARMACIST - PHYSICIAN COMMUNICATION  DR: [name], MD  CONCERNING: IV to Oral Route Change Policy  RECOMMENDATION: This patient is receiving Keppra 500 mg BID by the intravenous route.  Based on criteria approved by the Pharmacy and Therapeutics Committee, the intravenous medication(s) is/are being converted to the equivalent oral dose form(s).   DESCRIPTION: These criteria include: The patient is eating (either orally or via tube) and/or has been taking other orally administered medications for a least 24 hours The patient has no evidence of active gastrointestinal bleeding or impaired GI absorption (gastrectomy, short bowel, patient on TNA or NPO).  If you have questions about this conversion, please contact the Pharmacy Department  []   [phone] )  [facility] [x]   [phone] )  [facility] []   [phone] )  [facility] []   [phone] )  [facility] []   [phone] )  Ilene Qua   Celene Squibb, PharmD Clinical Pharmacist 07/18/2023 2:22 PM

## 2023-07-18 NOTE — Progress Notes (Signed)
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   Goal of Therapy:  Electrolytes WNL  Plan:  ---no electrolyte replacement warranted for today ---recheck electrolytes in am  Lowella Bandy ,PharmD Clinical Pharmacist 07/18/2023 7:14 AM

## 2023-07-18 NOTE — TOC Progression Note (Addendum)
Transition of Care [facility]) - Progression Note    Patient Details  Name: Joshua Berry MRN: [id number] Date of Birth: 04/27/1946  [facility] CM/SW Contact  Liliana Cline, LCSW Phone Number: 07/18/2023, 11:56 AM  Clinical Narrative:    Met with patient's son Susy Frizzle and daughter in law at bedside. They called patient's spouse who was present over speaker phone.Explained we are waiting on the [facility] to reply with a list of their contracted SNFs. TOC will also need to submit paperwork for SNF approval through [place].  Patient's SW at the [place] is [place] -checked with [facility] Supervisor who left a voicemail for this morning. CSW also attempted call to [place] at the [place] and left another VM requesting a return call.  [name] states patient does have Medicare and went to [facility] in the past using his Medicare. However, she does not want patient to go to SNF under his Medicare. She feels he will need more than 21 days and he does not have the funds to pay for his copay days after that. She understands he would need to go to a [place] contracted SNF to use his VA benefits. She does not want [facility]. She inquired about [facility] in [place].  CSW called [facility] to inquire if they are in contract with [place]. [facility] in Admissions states they are contracted with [facility] and requested referral be faxed to [phone]. Merial's call back number is [phone]. Merial reported she will have to review the referral on Monday and requested TOC call her on Monday. Referral faxed to [facility] and [facility] handoff updated.  Updated daughter in law Canella [phone]).   12:30- Call from [place] with [facility], she states she will send the form for [facility] SNF approval. Once that is approved, they will send list of contracted SNFs. Updated the family.   2:15- VA Checklist and requested Medical notes faxed to the [place].      Barriers to Discharge: Continued Medical Work up  Expected Discharge Plan  and Services       Living arrangements for the past 2 months: Assisted Living Facility                                       Social Determinants of Health (SDOH) Interventions SDOH Screenings   Food Insecurity: No Food Insecurity (06/17/2023)  Housing: Low Risk  (06/17/2023)  Transportation Needs: Unmet Transportation Needs (06/17/2023)  Utilities: At Risk (06/17/2023)  Depression (PHQ2-9): Low Risk  (04/24/2023)  Social Connections: Unknown (04/29/2022)   Received from [facility]  Tobacco Use: Medium Risk (07/14/2023)    Readmission Risk Interventions    07/15/2023    8:52 AM 06/18/2023   10:19 AM 05/17/2022    1:45 PM  Readmission Risk Prevention Plan  Transportation Screening Complete Complete Complete  PCP or Specialist Appt within 5-7 Days  Complete Complete  PCP or Specialist Appt within 3-5 Days Complete    Home Care Screening  Complete Complete  Medication Review (RN CM)  Complete Complete  Social Work Consult for Recovery Care Planning/Counseling Complete    Palliative Care Screening Not Applicable    Medication Review Oceanographer) Referral to Pharmacy

## 2023-07-19 DIAGNOSIS — S065XAA Traumatic subdural hemorrhage with loss of consciousness status unknown, initial encounter: Secondary | ICD-10-CM | POA: Diagnosis not present

## 2023-07-19 LAB — GLUCOSE, CAPILLARY
Glucose-Capillary: 149 mg/dL — ABNORMAL HIGH (ref 70–99)
Glucose-Capillary: 201 mg/dL — ABNORMAL HIGH (ref 70–99)
Glucose-Capillary: 188 mg/dL — ABNORMAL HIGH (ref 70–99)
Glucose-Capillary: 172 mg/dL — ABNORMAL HIGH (ref 70–99)

## 2023-07-19 MED ORDER — POTASSIUM CHLORIDE CRYS ER 20 MEQ PO TBCR
40.0000 meq | EXTENDED_RELEASE_TABLET | Freq: Once | ORAL | Status: AC
  Administered 2023-07-19: 40 meq via ORAL
  Filled 2023-07-19: qty 2

## 2023-07-19 MED ORDER — BOOST / RESOURCE BREEZE PO LIQD CUSTOM
1.0000 | Freq: Three times a day (TID) | ORAL | Status: DC
  Administered 2023-07-19 – 2023-07-21 (×7): 1 via ORAL

## 2023-07-19 MED ORDER — LACTULOSE 10 GM/15ML PO SOLN
30.0000 g | ORAL | Status: AC
  Administered 2023-07-19 (×2): 30 g via ORAL
  Filled 2023-07-19: qty 60

## 2023-07-19 MED ORDER — LACTULOSE 10 GM/15ML PO SOLN
30.0000 g | ORAL | Status: DC
  Administered 2023-07-19: 30 g via ORAL
  Filled 2023-07-19: qty 60

## 2023-07-19 NOTE — Plan of Care (Signed)
  Problem: Education: Goal: Knowledge of General Education information will improve Description Including pain rating scale, medication(s)/side effects and non-pharmacologic comfort measures Outcome: Progressing   

## 2023-07-19 NOTE — Progress Notes (Signed)
Physical Therapy Treatment Patient Details Name: Joshua Berry MRN: [id number] DOB: [birth year] Today's Date: 07/19/2023   History of Present Illness 77 y/o male presented to ED on 07/14/23 from [facility] for AMS and headache x 4-5 days. CT head showed acute bilateral subdural hematomas with L midline shift. S/p bilateral frontal burr holes on 7/29. PMH: cirrhosis, diabetes, HTN, heart murmur, sleep apnea, hx of melanoma, iron deficiency anemia.    PT Comments  Pt was long sitting in bed upon arrival. He is alert but presents with slow processing. He requires extensive +1 assistance to safely exit L side of bed, stand to RW 3 x then stood and took steps to [facility]. Pt needed to have prolonged time to successfully have BM. He remained on [facility] at conclusion of session with RN staff made aware. Acute PT will continue to follow and progress per current POC. DC recs remain appropriate.    If plan is discharge home, recommend the following: A lot of help with walking and/or transfers;A lot of help with bathing/dressing/bathroom;Assistance with cooking/housework;Direct supervision/assist for medications management;Direct supervision/assist for financial management;Assist for transportation;Help with stairs or ramp for entrance     Equipment Recommendations  Other (comment) (Defer to next level of care)       Precautions / Restrictions Precautions Precautions: Fall Restrictions Weight Bearing Restrictions: No     Mobility  Bed Mobility Overal bed mobility: Needs Assistance Bed Mobility: Supine to Sit, Sit to Supine  Supine to sit: Mod assist, HOB elevated  General bed mobility comments: increased time + mod assist to safely achieve EOB sitting    Transfers Overall transfer level: Needs assistance Equipment used: Rolling walker (2 wheels) Transfers: Sit to/from Stand Sit to Stand: From elevated surface, Mod assist, Max assist  General transfer comment: Pt was able to stand 3 x EOB prior to take  steps to [facility]. session cut short due to pt needing to have prolonged BM. RN staff made aware that pt was sitting on BSC at conclusion of session.    Ambulation/Gait Ambulation/Gait assistance: Min assist Gait Distance (Feet): 3 Feet Assistive device: Rolling walker (2 wheels) Gait Pattern/deviations: Step-to pattern, Antalgic, Decreased stance time - right, Decreased stance time - left, Decreased stride length, Narrow base of support Gait velocity: decreased  General Gait Details: Pt was able to take ~ 3 steps to [facility]. Flexed posture but able to clear floor with both feet to take steps. session greatly limited by pt needing to have prolonged BM     Balance Overall balance assessment: Needs assistance Sitting-balance support: Bilateral upper extremity supported, Feet supported Sitting balance-Leahy Scale: Fair Sitting balance - Comments: no LOB in sitting   Standing balance support: Bilateral upper extremity supported, During functional activity, Reliant on assistive device for balance Standing balance-Leahy Scale: Fair Standing balance comment: reliant on RW for all standing activity    Cognition Arousal/Alertness: Awake/alert Behavior During Therapy: Flat affect Overall Cognitive Status: Impaired/Different from baseline    General Comments: Pt is A but presents with increased processing time.               Pertinent Vitals/Pain Pain Assessment Pain Assessment: No/denies pain Pain Score: 0-No pain     PT Goals (current goals can now be found in the care plan section) Acute Rehab PT Goals Patient Stated Goal: reurn to a ALF Progress towards PT goals: Progressing toward goals    Frequency    Min 1X/week      PT Plan Current plan remains  appropriate    Co-evaluation     PT goals addressed during session: Mobility/safety with mobility;Balance        AM-PAC PT "6 Clicks" Mobility   Outcome Measure  Help needed turning from your back to your side while in a  flat bed without using bedrails?: A Lot Help needed moving from lying on your back to sitting on the side of a flat bed without using bedrails?: A Lot Help needed moving to and from a bed to a chair (including a wheelchair)?: A Lot Help needed standing up from a chair using your arms (e.g., wheelchair or bedside chair)?: A Lot Help needed to walk in hospital room?: A Lot Help needed climbing 3-5 steps with a railing? : Total 6 Click Score: 11    End of Session   Activity Tolerance: Patient tolerated treatment well Patient left: Other (comment) (On BSC at conclusion of session with RN staff made aware) Nurse Communication: Mobility status PT Visit Diagnosis: Unsteadiness on feet (R26.81);Muscle weakness (generalized) (M62.81);Other abnormalities of gait and mobility (R26.89);Other symptoms and signs involving the nervous system (R29.898)     Time: 3244-0102 PT Time Calculation (min) (ACUTE ONLY): 11 min  Charges:    $Therapeutic Activity: 8-22 mins PT General Charges $$ ACUTE PT VISIT: 1 Visit                    Jetta Lout PTA 07/19/23, 1:53 PM

## 2023-07-19 NOTE — Progress Notes (Signed)
  PROGRESS NOTE    BLU LORI  ZOX:[id number] DOB: 09-16-1946 DOA: 07/14/2023 [facility]  103A/103A-AA  LOS: 5 days   Brief hospital course:   Assessment & Plan: 77 yo M presenting to [facility] ED from [place] assisted living via EMS for evaluation of altered mental status.    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 --s/p plt and FFP.  No need for further supplementation. 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  PMHx: esophageal/gastric varices - cont home rifaximin  --increase lactulose today to produce BM   OSA Was on CPAP historically but not recently   Hypothyroidism --cont home Synthroid   Type 2 Diabetes Mellitus --recent A1c 5.4 about a month ago --ACHS and SSI   GERD  --cont home PPI   Dyslipidemia --resume home statin after discharge  Acute urinary retention --cont Foley for now --voiding trial in a few days   DVT prophylaxis: SCD/Compression stockings Code Status: DNR  Family Communication: family updated at bedside today Level of care: Med-Surg Dispo:   The patient is from: ALF Anticipated d/c is to: SNF rehab Anticipated d/c date is: whenever bed available   Subjective and Interval History:  Pt was sleepy after a bath this morning, but per family, pt was coherent.    No BM yet, so lactulose increased, and resulted in large BM by afternoon.   Objective: Vitals:   07/19/23 0113 07/19/23 0452 07/19/23 0500 07/19/23 0902  BP: 118/60 (!) 119/59  (!) 108/56  Pulse: (!) 52 (!) 47  65  Resp: 20 20  16   Temp: 98.3 F (36.8 C) 98.2 F (36.8 C)  98.4 F (36.9 C)   TempSrc:  Oral  Oral  SpO2: 95% 99%  95%  Weight:   99.5 kg   Height:        Intake/Output Summary (Last 24 hours) at 07/19/2023 1615 Last data filed at 07/18/2023 1900 Gross per 24 hour  Intake 120 ml  Output --  Net 120 ml   Filed Weights   07/17/23 0500 07/18/23 0440 07/19/23 0500  Weight: 98.6 kg 100.6 kg 99.5 kg    Examination:   Constitutional: NAD, sleepy but arousable CV: No cyanosis.   RESP: normal respiratory effort, on RA SKIN: warm, dry   Data Reviewed: I have personally reviewed labs and imaging studies  Time spent: 35 minutes  [name], MD [facility] If 7PM-7AM, please contact night-coverage 07/19/2023, 4:15 PM

## 2023-07-19 NOTE — Plan of Care (Signed)

## 2023-07-20 ENCOUNTER — Inpatient Hospital Stay: Payer: No Typology Code available for payment source

## 2023-07-20 DIAGNOSIS — S065XAA Traumatic subdural hemorrhage with loss of consciousness status unknown, initial encounter: Secondary | ICD-10-CM | POA: Diagnosis not present

## 2023-07-20 LAB — BASIC METABOLIC PANEL WITH GFR
Anion gap: 8 (ref 5–15)
BUN: 16 mg/dL (ref 8–23)
CO2: 23 mmol/L (ref 22–32)
Calcium: 8.6 mg/dL — ABNORMAL LOW (ref 8.9–10.3)
Chloride: 105 mmol/L (ref 98–111)
Creatinine, Ser: 0.67 mg/dL (ref 0.61–1.24)
GFR, Estimated: >60 mL/min (ref >60)
Glucose, Bld: 146 mg/dL — ABNORMAL HIGH (ref 70–99)
Potassium: 3.5 mmol/L (ref 3.5–5.1)
Sodium: 136 mmol/L (ref 135–145)

## 2023-07-20 LAB — MAGNESIUM: Magnesium: 1.5 mg/dL — ABNORMAL LOW (ref 1.7–2.4)

## 2023-07-20 LAB — CBC
HCT: 24.7 % — ABNORMAL LOW (ref 39.0–52.0)
Hemoglobin: 9.2 g/dL — ABNORMAL LOW (ref 13.0–17.0)
MCH: 34.2 pg — ABNORMAL HIGH (ref 26.0–34.0)
MCHC: 37.2 g/dL — ABNORMAL HIGH (ref 30.0–36.0)
MCV: 91.8 fL (ref 80.0–100.0)
Platelets: 50 10*3/uL — ABNORMAL LOW (ref 150–400)
RBC: 2.69 MIL/uL — ABNORMAL LOW (ref 4.22–5.81)
RDW: 14.5 % (ref 11.5–15.5)
WBC: 2.9 10*3/uL — ABNORMAL LOW (ref 4.0–10.5)
nRBC: 0 % (ref 0.0–0.2)

## 2023-07-20 LAB — GLUCOSE, CAPILLARY
Glucose-Capillary: 133 mg/dL — ABNORMAL HIGH (ref 70–99)
Glucose-Capillary: 198 mg/dL — ABNORMAL HIGH (ref 70–99)
Glucose-Capillary: 187 mg/dL — ABNORMAL HIGH (ref 70–99)
Glucose-Capillary: 139 mg/dL — ABNORMAL HIGH (ref 70–99)

## 2023-07-20 MED ORDER — LACTULOSE 10 GM/15ML PO SOLN
30.0000 g | ORAL | Status: DC
  Administered 2023-07-20 (×2): 30 g via ORAL
  Filled 2023-07-20 (×2): qty 60

## 2023-07-20 MED ORDER — MAGNESIUM SULFATE 2 GM/50ML IV SOLN
2.0000 g | Freq: Once | INTRAVENOUS | Status: AC
  Administered 2023-07-20: 2 g via INTRAVENOUS
  Filled 2023-07-20: qty 50

## 2023-07-20 MED ORDER — LACTULOSE 10 GM/15ML PO SOLN
30.0000 g | ORAL | Status: AC
  Administered 2023-07-20 – 2023-07-21 (×2): 30 g via ORAL
  Filled 2023-07-20 (×2): qty 60

## 2023-07-20 MED ORDER — BUTALBITAL-APAP-CAFFEINE 50-325-40 MG PO TABS
2.0000 | ORAL_TABLET | Freq: Once | ORAL | Status: AC
  Administered 2023-07-20: 2 via ORAL
  Filled 2023-07-20: qty 2

## 2023-07-20 NOTE — Progress Notes (Signed)
  PROGRESS NOTE    Joshua Berry  NUU:[id number] DOB: July 07, 1946 DOA: 07/14/2023 [facility]  103A/103A-AA  LOS: 6 days   Brief hospital course:   Assessment & Plan: 77 yo M presenting [facility] ED from [place] assisted living via EMS for evaluation of altered mental status.    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 --s/p plt and FFP.  No need for further supplementation. 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  PMHx: esophageal/gastric varices - cont home rifaximin  --lactulose QID today    OSA Was on CPAP historically but not recently   Hypothyroidism --cont home Synthroid   Type 2 Diabetes Mellitus --recent A1c 5.4 about a month ago --SSI   GERD  --cont home PPI   Dyslipidemia --resume home statin after discharge  Acute urinary retention --cont Foley for now --voiding trial in a few days   DVT prophylaxis: SCD/Compression stockings Code Status: DNR  Family Communication:  Level of care: Med-Surg Dispo:   The patient is from: ALF Anticipated d/c is to: SNF rehab Anticipated d/c date is: whenever bed available   Subjective and Interval History:  Pt was sleepy during rounds, but arousable.  Around evening time, RN reported pt complaining of headache not improved with Tyleno.  CT head ordered.   Objective: Vitals:   07/20/23 0400 07/20/23 0500 07/20/23 0804 07/20/23 1549  BP: (!) 112/54  (!) 105/54 (!) 114/59  Pulse: (!) 59  63 98  Resp: 16  16 18   Temp: 98.4 F (36.9 C)  98.6 F (37 C) 98 F (36.7 C)  TempSrc: Oral  Oral   SpO2: 96%  96% 99%  Weight:  106 kg     Height:        Intake/Output Summary (Last 24 hours) at 07/20/2023 1915 Last data filed at 07/20/2023 1823 Gross per 24 hour  Intake --  Output 600 ml  Net -600 ml   Filed Weights   07/18/23 0440 07/19/23 0500 07/20/23 0500  Weight: 100.6 kg 99.5 kg 106 kg    Examination:   Constitutional: NAD, sleepy but arousable, oriented CV: No cyanosis.   RESP: normal respiratory effort, on RA Extremities: No effusions, edema in BLE SKIN: warm, dry Foley present  Data Reviewed: I have personally reviewed labs and imaging studies  Time spent: 35 minutes  [name], MD [facility] If 7PM-7AM, please contact night-coverage 07/20/2023, 7:15 PM

## 2023-07-20 NOTE — Procedures (Signed)
Routine EEG Report  Joshua Berry is a 77 y.o. male with a history of altered mental status who is undergoing an EEG to evaluate for seizures.  Report: This EEG was acquired with electrodes placed according to the International 10-20 electrode system (including Fp1, Fp2, F3, F4, C3, C4, P3, P4, O1, O2, T3, T4, T5, T6, A1, A2, Fz, Cz, Pz). The following electrodes were missing or displaced: none.  The occipital dominant rhythm was 4-5 Hz with occasional triphasic waves. This activity is reactive to stimulation. Drowsiness was manifested by background fragmentation; deeper stages of sleep were identified by K complexes and sleep spindles. There was no focal slowing. There were no interictal epileptiform discharges. There were no electrographic seizures identified. Photic stimulation and hyperventilation were not performed.   Impression and clinical correlation: This EEG was obtained while awake and asleep and is abnormal due to moderate diffuse slowing indicative of global cerebral dysfunction and occasional triphasic waves indicative of metabolic encephalopathy. Epileptiform abnormalities were not seen during this recording.  Bing Neighbors, MD [facility] [phone]  If 7pm- 7am, please page neurology on call as listed in AMION.

## 2023-07-20 NOTE — Evaluation (Signed)
Speech Language Pathology Evaluation + Swallowing Treatment Patient Details Name: Joshua Berry MRN: [id number] DOB: 10-02-1946 Today's Date: 07/20/2023 Time: 6213-0865 SLP Time Calculation (min) (ACUTE ONLY): 25 min  Problem List:  Patient Active Problem List   Diagnosis Date Noted   Acute nontraumatic intracranial subdural hematoma (HCC) 07/15/2023   Subdural hematoma (HCC) 07/14/2023   Compression of brain (HCC) 07/14/2023   Uncal herniation (HCC) 07/14/2023   Type 2 diabetes mellitus without complications (HCC) 06/17/2023   GERD without esophagitis 06/17/2023   Dyslipidemia 06/17/2023   Pressure injury of skin 06/17/2023   Acute urinary retention 04/11/2023   Streptococcal bacteremia 04/07/2023   Altered mental status 04/06/2023   Hepatic encephalopathy (HCC) 04/05/2023   Overweight (BMI 25.0-29.9) 08/03/2022   Hyponatremia, hypokalemia and hypomagnesemia 05/18/2022   Hyponatremia 05/18/2022   Hypokalemia 05/18/2022   Back pain 05/17/2022   Hypomagnesemia 05/17/2022   Acute hepatic encephalopathy (HCC) 05/17/2022   AKI (acute kidney injury) (HCC) 05/16/2022   Elevated liver enzymes 05/16/2022   Hypotension 05/16/2022   Right shoulder pain 05/16/2022   Hypothyroidism 05/16/2022   Physical deconditioning 05/16/2022   Obesity (BMI 30-39.9) 05/16/2022   Lactic acidosis 05/16/2022   Fall at home, initial encounter 05/16/2022   Septic shock due to Staphylococcus bacteremia 05/14/2022   Chest pain 04/06/2022   Controlled IDDM-2 with hyperglycemia 04/06/2022   HLD (hyperlipidemia) 04/06/2022   HTN (hypertension) 04/06/2022   Chronic diastolic CHF (congestive heart failure) (HCC) 04/06/2022   Pancytopenia (HCC) 04/06/2022   GI bleeding 04/24/2021   Cirrhosis of liver without ascites (HCC)    Secondary esophageal varices without bleeding (HCC)    Portal hypertension (HCC)    Stomach irritation    Melena    Acute gastrointestinal hemorrhage 07/19/2019   Severe sepsis (HCC)  12/20/2018   Past Medical History:  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   Past Surgical History:  Past Surgical History:  Procedure Laterality Date   BURR HOLE Bilateral 07/14/2023   Procedure: BURR HOLES;  Surgeon: Venetia Night, MD;  Location: [facility] ORS;  Service: Neurosurgery;  Laterality: Bilateral;   CHOLECYSTECTOMY     COLONOSCOPY N/A 07/23/2019   Procedure: COLONOSCOPY;  Surgeon: Toney Reil, MD;  Location: [facility] ENDOSCOPY;  Service: Gastroenterology;  Laterality: N/A;   ESOPHAGOGASTRODUODENOSCOPY N/A 07/20/2019   Procedure: ESOPHAGOGASTRODUODENOSCOPY (EGD);  Surgeon: Toney Reil, MD;  Location: [facility] ENDOSCOPY;  Service: Gastroenterology;  Laterality: N/A;   ESOPHAGOGASTRODUODENOSCOPY Left 08/20/2019   Procedure: ESOPHAGOGASTRODUODENOSCOPY (EGD);  Surgeon: Pasty Spillers, MD;  Location: [facility] ENDOSCOPY;  Service: Endoscopy;  Laterality: Left;   ESOPHAGOGASTRODUODENOSCOPY (EGD) WITH PROPOFOL N/A 04/25/2021   Procedure: ESOPHAGOGASTRODUODENOSCOPY (EGD) WITH PROPOFOL;  Surgeon: Toney Reil, MD;  Location: [facility] ENDOSCOPY;  Service: Gastroenterology;  Laterality: N/A;   MECHANICAL AORTIC VALVE REPLACEMENT     SKIN BIOPSY     TEE WITHOUT CARDIOVERSION N/A 12/23/2018   Procedure: TRANSESOPHAGEAL ECHOCARDIOGRAM (TEE);  Surgeon: Dalia Heading, MD;  Location: [facility] ORS;  Service: Cardiovascular;  Laterality: N/A;   TEE WITHOUT CARDIOVERSION  N/A 05/21/2022   Procedure: TRANSESOPHAGEAL ECHOCARDIOGRAM (TEE);  Surgeon: Antonieta Iba, MD;  Location: [facility] ORS;  Service: Cardiovascular;  Laterality:  N/A;   TEE WITHOUT CARDIOVERSION N/A 04/09/2023   Procedure: TRANSESOPHAGEAL ECHOCARDIOGRAM;  Surgeon: Debbe Odea, MD;  Location: [facility] ORS;  Service: Cardiovascular;  Laterality: N/A;   HPI:  Pt presented to ED on 07/14/23 with a chief complaint of altered mental status and "terrible" headaches. Workup revealed B SDH, pt now s/p B burr holes on 7/29. PMHx history of diabetes, hypertension, and cirrhosis. Head CT, 7/31, "The amount of intracranial air is stable or slightly  diminished. Both subdural drains have been removed. On the right,  the subdural hematoma is stable, maximal thickness 7 mm. On the  left, the subdural blood and fluid is overall stable, but there is a  slight increase in acute subdural blood at about the level of the  left-sided burr hole. If this does not continue to increase, it is  probably not significant, but certainly this should be watched.  There is left-to-right shift of 1-2 mm. No hydrocephalus. No  intraparenchymal hemorrhage. Subdural blood along the tentorium is  stable." CXR, 7/30, "1. Endotracheal tube, if present, is not identified within the  visualized portions of the neck and chest.  2. Pulmonary vascular congestion without overt edema."   Assessment / Plan / Recommendation Clinical Impression  Pt seen for cognitive-linguistic evaluation and swallowing treatment. Pt alert, pleasant, and cooperative. Slow to respond at times. Stepson present. Cognitive-linguistic evaluation completed via SLUMS and informal assessment. Pt scored 19/30 on SLUMS. Pt presents with cognitive-linguistic deficits affecting attention, memory (immediate, short term, working), and executive functioning. Per stepson, pt nearing cognitive-linguistic baseline.   Pt not observed directly with POs as pt had just finished breakfast. Per pt and stepson, pt tolerating current diet without overt s/sx pharyngeal dysphagia. Pt endorsed current diet is "just right" for mastication given pt's dental  status. Stepson noted that diet is very similar to pt's baseline diet with exception of spaghetti which is not allowed on Dysphagia 2 Diet. Doreene Adas thought it would be best to hold off any any uupgrades though to allow for pt to have spaghetti. Per chart review, temp WNL and WBC low. Pt on room air. No recent chest imaging.   Encouraged stepson to reach out to SLP if pt or family thought pt was ready for re-assessment for upgrade. Education completed re: progress to date (both swallowing and cognitive-linguistic), results of assessment, diet recommendations, diet options, safe swallowing strategies, and SLP POC. Pt and stepson verbalized understanding.  SLP to f/u per POC for cognitive-linguistic tx and clinical swallowing re-assessment as appropriate.    SLP Assessment  SLP Recommendation/Assessment: Patient needs continued Speech Lanaguage Pathology Services SLP Visit Diagnosis: Dysphagia, oropharyngeal phase (R13.12);Attention and concentration deficit;Cognitive communication deficit (R41.841)    Recommendations for follow up therapy are one component of a multi-disciplinary discharge planning process, led by the attending physician.  Recommendations may be updated based on patient status, additional functional criteria and insurance authorization.    Follow Up Recommendations  Skilled nursing-short term rehab (<3 hours/day)    Assistance Recommended at Discharge  Frequent or constant Supervision/Assistance  Functional Status Assessment Patient has had a recent decline in their functional status and demonstrates the ability to make significant improvements in function in a reasonable and predictable amount of time.  Frequency and Duration min 2x/week  2 weeks      SLP Evaluation Cognition  Overall Cognitive Status: Impaired/Different from  baseline Arousal/Alertness: Awake/alert Orientation Level: Oriented to person;Oriented to place;Oriented to situation (not to date or DOW) Attention:  Sustained Sustained Attention: Impaired Sustained Attention Impairment: Verbal basic Memory: Impaired Memory Impairment: Storage deficit;Retrieval deficit Problem Solving: Appears intact Problem Solving Impairment: Verbal basic Executive Function: Self Monitoring;Self Correcting Self Monitoring: Impaired Self Correcting: Impaired Safety/Judgment: Impaired       Comprehension  Auditory Comprehension Overall Auditory Comprehension: Appears within functional limits for tasks assessed    Expression Expression Primary Mode of Expression: Verbal Verbal Expression Overall Verbal Expression: Impaired Initiation: No impairment Automatic Speech: Name;Social Response Level of Generative/Spontaneous Verbalization: Sentence;Conversation Repetition: No impairment Naming: Impairment Divergent:  (7 "animals" in 60s) Pragmatics: No impairment Interfering Components: Attention Written Expression Dominant Hand: Right Written Expression: Not tested (functional for clockdrawing)   Oral / Motor  Oral Motor/Sensory Function Overall Oral Motor/Sensory Function: Within functional limits (unable to follow commands; no obvious deficits) Motor Speech Overall Motor Speech: Appears within functional limits for tasks assessed Respiration: Within functional limits Phonation: Normal Resonance: Within functional limits Articulation: Within functional limitis Intelligibility: Intelligible Motor Planning: Witnin functional limits           Clyde Canterbury, M.S., CCC-SLP Speech-Language Pathologist [place] [phone] (ASCOM)  Woodroe Chen 07/20/2023, 11:10 AM

## 2023-07-21 ENCOUNTER — Inpatient Hospital Stay: Payer: No Typology Code available for payment source

## 2023-07-21 ENCOUNTER — Ambulatory Visit: Payer: No Typology Code available for payment source

## 2023-07-21 DIAGNOSIS — R4182 Altered mental status, unspecified: Secondary | ICD-10-CM | POA: Diagnosis not present

## 2023-07-21 DIAGNOSIS — S065XAA Traumatic subdural hemorrhage with loss of consciousness status unknown, initial encounter: Secondary | ICD-10-CM | POA: Diagnosis not present

## 2023-07-21 LAB — BLOOD GAS, ARTERIAL
Acid-Base Excess: 4.4 mmol/L — ABNORMAL HIGH (ref 0.0–2.0)
Bicarbonate: 26.7 mmol/L (ref 20.0–28.0)
O2 Saturation: 98.6 %
Patient temperature: 37
pCO2 arterial: 32 mmHg (ref 32–48)
pH, Arterial: 7.53 — ABNORMAL HIGH (ref 7.35–7.45)
pO2, Arterial: 77 mmHg — ABNORMAL LOW (ref 83–108)

## 2023-07-21 LAB — GLUCOSE, CAPILLARY
Glucose-Capillary: 131 mg/dL — ABNORMAL HIGH (ref 70–99)
Glucose-Capillary: 155 mg/dL — ABNORMAL HIGH (ref 70–99)
Glucose-Capillary: 180 mg/dL — ABNORMAL HIGH (ref 70–99)
Glucose-Capillary: 142 mg/dL — ABNORMAL HIGH (ref 70–99)
Glucose-Capillary: 122 mg/dL — ABNORMAL HIGH (ref 70–99)
Glucose-Capillary: 130 mg/dL — ABNORMAL HIGH (ref 70–99)

## 2023-07-21 LAB — AMMONIA: Ammonia: 57 umol/L — ABNORMAL HIGH (ref 9–35)

## 2023-07-21 MED ORDER — ACETAMINOPHEN 650 MG RE SUPP
650.0000 mg | Freq: Four times a day (QID) | RECTAL | Status: DC | PRN
  Filled 2023-07-21: qty 1

## 2023-07-21 MED ORDER — BUTALBITAL-APAP-CAFFEINE 50-325-40 MG PO TABS
2.0000 | ORAL_TABLET | Freq: Three times a day (TID) | ORAL | Status: DC | PRN
  Administered 2023-07-21 – 2023-08-01 (×16): 2 via ORAL
  Filled 2023-07-21 (×17): qty 2

## 2023-07-21 MED ORDER — ACETAMINOPHEN 325 MG PO TABS
650.0000 mg | ORAL_TABLET | Freq: Four times a day (QID) | ORAL | Status: DC | PRN
  Administered 2023-07-21: 650 mg via ORAL

## 2023-07-21 MED ORDER — LACTULOSE 10 GM/15ML PO SOLN
30.0000 g | Freq: Four times a day (QID) | ORAL | Status: DC
  Administered 2023-07-21 (×2): 30 g via ORAL
  Filled 2023-07-21 (×2): qty 60

## 2023-07-21 MED ORDER — LEVETIRACETAM IN NACL 1000 MG/100ML IV SOLN
1000.0000 mg | Freq: Two times a day (BID) | INTRAVENOUS | Status: DC
  Administered 2023-07-21 – 2023-07-27 (×12): 1000 mg via INTRAVENOUS
  Filled 2023-07-21 (×13): qty 100

## 2023-07-21 MED ORDER — LACTULOSE ENEMA
300.0000 mL | ORAL | Status: DC
  Administered 2023-07-21 – 2023-07-22 (×3): 300 mL via RECTAL
  Filled 2023-07-21 (×10): qty 300

## 2023-07-21 MED ORDER — LACTULOSE 10 GM/15ML PO SOLN: 40.0000 g | Freq: Four times a day (QID) | ORAL | Status: DC

## 2023-07-21 NOTE — Progress Notes (Signed)
Family concerned noted change in Pt being lethargic  MD notified

## 2023-07-21 NOTE — Progress Notes (Signed)
Responded to rapid. Family in conversation with medical team. No needs expressed at this time. Please let chaplain services know if needs arise.

## 2023-07-21 NOTE — Procedures (Addendum)
Routine EEG Report  ARSENE DESHAZIER is a 77 y.o. male with a history of seizure who is undergoing an EEG to evaluate for seizures.  Report: This EEG was acquired with electrodes placed according to the International 10-20 electrode system (including Fp1, Fp2, F3, F4, C3, C4, P3, P4, O1, O2, T3, T4, T5, T6, A1, A2, Fz, Cz, Pz). The following electrodes were missing or displaced: none.  The occipital dominant rhythm was 3-5 Hz. This activity is reactive to stimulation. Drowsiness was manifested by background fragmentation; deeper stages of sleep were identified by K complexes and sleep spindles. There was no focal slowing. There were no interictal epileptiform discharges. There were no electrographic seizures identified. Photic stimulation and hyperventilation were not performed.  Impression and clinical correlation: This EEG was obtained while awake and asleep and is abnormal due to moderate to severe diffuse slowing indicative of global cerebral dysfunction. Epileptiform abnormalities were not seen during this recording.  Bing Neighbors, MD [facility] [phone]  If 7pm- 7am, please page neurology on call as listed in AMION.

## 2023-07-21 NOTE — Progress Notes (Signed)
Occupational Therapy Treatment Patient Details Name: Joshua Berry MRN: [id number] DOB: December 02, 1946 Today's Date: 07/21/2023   History of present illness 77 y/o male presented to ED on 07/14/23 from [place] for AMS and headache x 4-5 days. CT head showed acute bilateral subdural hematomas with L midline shift. S/p bilateral frontal burr holes on 7/29. PMH: cirrhosis, diabetes, HTN, heart murmur, sleep apnea, hx of melanoma, iron deficiency anemia.   OT comments  Mr Parrales was seen for OT treatment on this date. Upon arrival to room pt seated in chair, reports fatigue and wishes to return to bed, agreeable to tx. Pt requires MOD A sit>stand from chair, MIN A bed>chair step pivot t/f, cues for sequencing. MOD A sit>sup, assist for BLE. Pt making good progress toward goals, will continue to follow POC. Discharge recommendation remains appropriate.     Recommendations for follow up therapy are one component of a multi-disciplinary discharge planning process, led by the attending physician.  Recommendations may be updated based on patient status, additional functional criteria and insurance authorization.    Assistance Recommended at Discharge Frequent or constant Supervision/Assistance  Patient can return home with the following  Two people to help with walking and/or transfers;Two people to help with bathing/dressing/bathroom;Help with stairs or ramp for entrance   Equipment Recommendations  Other (comment) (defer)    Recommendations for Other Services      Precautions / Restrictions Precautions Precautions: Fall Restrictions Weight Bearing Restrictions: No       Mobility Bed Mobility Overal bed mobility: Needs Assistance Bed Mobility: Sit to Supine       Sit to supine: Mod assist   General bed mobility comments: assist for BLE    Transfers Overall transfer level: Needs assistance Equipment used: Rolling walker (2 wheels) Transfers: Sit to/from Stand, Bed to  chair/wheelchair/BSC Sit to Stand: Mod assist     Step pivot transfers: Min assist           Balance Overall balance assessment: Needs assistance Sitting-balance support: Bilateral upper extremity supported, Feet supported Sitting balance-Leahy Scale: Fair     Standing balance support: Bilateral upper extremity supported, Reliant on assistive device for balance Standing balance-Leahy Scale: Poor                             ADL either performed or assessed with clinical judgement   ADL Overall ADL's : Needs assistance/impaired                                       General ADL Comments: MAX A don B socks in sitting. MIN A + RW for simulated BSC t/f      Cognition Arousal/Alertness: Awake/alert Behavior During Therapy: WFL for tasks assessed/performed Overall Cognitive Status: Within Functional Limits for tasks assessed                                 General Comments: cues to sequence transfer                   Pertinent Vitals/ Pain       Pain Assessment Pain Assessment: 0-10 Pain Score: 6  Pain Location: headache Pain Descriptors / Indicators: Headache Pain Intervention(s): Limited activity within patient's tolerance, Repositioned   Frequency  Min 1X/week  Progress Toward Goals  OT Goals(current goals can now be found in the care plan section)  Progress towards OT goals: Progressing toward goals  Acute Rehab OT Goals Patient Stated Goal: to return to PLOF OT Goal Formulation: With patient/family Time For Goal Achievement: 07/30/23 Potential to Achieve Goals: Fair ADL Goals Pt Will Perform Grooming: with modified independence;sitting Pt Will Perform Lower Body Dressing: with min assist;sit to/from stand Pt Will Transfer to Toilet: with mod assist;stand pivot transfer;bedside commode  Plan Discharge plan remains appropriate;Frequency remains appropriate    Co-evaluation                  AM-PAC OT "6 Clicks" Daily Activity     Outcome Measure   Help from another person eating meals?: None Help from another person taking care of personal grooming?: A Little Help from another person toileting, which includes using toliet, bedpan, or urinal?: A Lot Help from another person bathing (including washing, rinsing, drying)?: A Lot Help from another person to put on and taking off regular upper body clothing?: A Little Help from another person to put on and taking off regular lower body clothing?: A Lot 6 Click Score: 16    End of Session    OT Visit Diagnosis: Other abnormalities of gait and mobility (R26.89);Muscle weakness (generalized) (M62.81)   Activity Tolerance Patient tolerated treatment well   Patient Left in bed;with call bell/phone within reach;with family/visitor present   Nurse Communication          Time: 5176-1607 OT Time Calculation (min): 19 min  Charges: OT General Charges $OT Visit: 1 Visit OT Treatments $Self Care/Home Management : 8-22 mins  Kathie Dike, M.S. OTR/L  07/21/23, 11:11 AM  ascom [phone]

## 2023-07-21 NOTE — TOC Progression Note (Signed)
Transition of Care [facility]) - Progression Note    Patient Details  Name: Joshua Berry MRN: [id number] Date of Birth: [birth year]  Transition of [facility]) CM/SW Contact  Allena Katz, LCSW Phone Number: 07/21/2023, 2:52 PM  Clinical Narrative:   Voicemail from [place], Child psychotherapist with [employer]. Stated they had reviewed request for short term stay rehab and was approved for 30 contract with [facility] facility. Instructed to call her to obtain list of [facility] facilities and further auth instructions. Number to reach her by: [phone] ext. [phone].       Barriers to Discharge: Continued Medical Work up  Expected Discharge Plan and Services       Living arrangements for the past 2 months: Assisted Living Facility                                       Social Determinants of Health (SDOH) Interventions SDOH Screenings   Food Insecurity: No Food Insecurity (07/18/2023)  Housing: Low Risk  (07/18/2023)  Transportation Needs: No Transportation Needs (07/18/2023)  Recent Concern: Transportation Needs - Unmet Transportation Needs (06/17/2023)  Utilities: Not At Risk (07/18/2023)  Recent Concern: Utilities - At Risk (06/17/2023)  Depression (PHQ2-9): Low Risk  (04/24/2023)  Social Connections: Unknown (04/29/2022)   [facility] from [facility]  Tobacco Use: Medium Risk (07/14/2023)    Readmission Risk Interventions    07/15/2023    8:52 AM 06/18/2023   10:19 AM 05/17/2022    1:45 PM  Readmission Risk Prevention Plan  Transportation Screening Complete Complete Complete  PCP or Specialist Appt within 5-7 Days  Complete Complete  PCP or Specialist Appt within 3-5 Days Complete    Home Care Screening  Complete Complete  Medication Review (RN CM)  Complete Complete  Social Work Consult for Recovery Care Planning/Counseling Complete    Palliative Care Screening Not Applicable    Medication Review Oceanographer) Referral to Pharmacy

## 2023-07-21 NOTE — Progress Notes (Signed)
PT Cancellation Note  Patient Details Name: Joshua Berry MRN: [id number] DOB: 1946/04/10   Cancelled Treatment:    Reason Eval/Treat Not Completed: Patient at procedure or test/unavailable (Spoke with nurse outside the room. Patient going soon for EEG. PT to continue with attempts as appropriate)  Donna Bernard, PT, MPT  [name] 07/21/2023, 2:38 PM

## 2023-07-21 NOTE — Progress Notes (Signed)
Rapid Response Event Note   Reason for Call : unresponsiveness   Initial Focused Assessment: On my arrival pt is unresponsive, only responsive to sternal rub at the time. Primary RN says pt has gotten more lethargic throughout day. RN waiting on lactulose enema to come from pharmacy that was due at 1630. HR 72, BP 143/67, 97% on room air. Family talking with Dr. Fran Lowes at bedside.  Interventions: CBG 142. VS stable at this time. MD to increase Keppra dose and order repeat head CT. Pt had head CT yesterday. Pt will transfer to stepdown for closer monitoring.   Plan of Care: Pt transferred to ICU 6.    Event Summary:   MD Notified: Dr. Fran Lowes Call Time: 1818 Arrival Time: 1820 End Time: Pt transferred to ICU 6  Henrene Dodge, RN

## 2023-07-21 NOTE — Progress Notes (Signed)
  PROGRESS NOTE    TACOMA DRUMM  HKV:[id number] DOB: 23-Aug-1946 DOA: 07/14/2023 PCP: [facility]  103A/103A-AA  LOS: 7 days   Brief hospital course:   Assessment & Plan: 77 yo M presenting to [facility] ED from [place] assisted living via EMS for evaluation of altered mental status.    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 --s/p plt and FFP.  No need for further supplementation. 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  Lactulose and rifaximin resumed on 8/2, with 1 large BM on 8/2, however, no more BM since then even though pt received 4 doses of lactulose per day for the past 2 days. --Pt more lethargic and confused today Plan: --order lactulose enema if pt too lethargic to take oral lactulose --EEG    OSA Was on CPAP historically but not recently   Hypothyroidism --cont home Synthroid   Type 2 Diabetes Mellitus --recent A1c 5.4 about a month ago --SSI   GERD  --cont home PPI   Dyslipidemia --resume home statin after discharge  Acute urinary retention --cont Foley for now --voiding trial in a few days after pt becomes more alert  Persistent headache --repeat head CT 8/4 due to persistent headache, read as stable   DVT prophylaxis: SCD/Compression stockings Code Status: DNR  Family Communication: son and daughter-in-law updated at bedside twice today Level of care: Med-Surg Dispo:   The patient is from: ALF Anticipated d/c is to: SNF rehab Anticipated d/c date is: 1-2  days   Subjective and Interval History:  Pt was more lethargic and confused today.  Has not had BM in the past day.     Objective: Vitals:   07/21/23 0616 07/21/23 0725 07/21/23 1358 07/21/23 1602  BP: 124/60 117/61 (!) 148/57 (!) 136/90  Pulse: (!) 58 (!) 56 71 70  Resp: 16 18  18   Temp: 98.1 F (36.7 C) 99 F (37.2 C) 99.5 F (37.5 C) 99.5 F (37.5 C)  TempSrc:      SpO2: 97% 99% 99% 96%  Weight:      Height:        Intake/Output Summary (Last 24 hours) at 07/21/2023 1736 Last data filed at 07/21/2023 1436 Gross per 24 hour  Intake 360 ml  Output 1275 ml  Net -915 ml   Filed Weights   07/19/23 0500 07/20/23 0500 07/21/23 0500  Weight: 99.5 kg 106 kg 106.3 kg    Examination:   Constitutional: NAD, lethargic CV: No cyanosis.   RESP: normal respiratory effort, on RA SKIN: warm, dry Foley present  Data Reviewed: I have personally reviewed labs and imaging studies  Time spent: 50 minutes  [name], MD [facility] If 7PM-7AM, please contact night-coverage 07/21/2023, 5:36 PM

## 2023-07-21 NOTE — Plan of Care (Signed)

## 2023-07-21 NOTE — Progress Notes (Addendum)
Patient was unable to swallow pills or water d/t being too lethargic. MD made aware and MD said to hold anything po d/t somnolence that was at 1658. Patient had Lactulose enema due at 1630 and was not sent until around 1810 during rapid response. MD placed transfer orders. Patient was transferred to room 6 ICU. Report called to Imma.

## 2023-07-21 NOTE — Progress Notes (Signed)
1855, Received pt from 1C, Pt was S/P   CRANIOTOMY HEMATOMA EVACUATION SUBDURAL (Right) as a surgical intervention on 7/29 and pt transferred to the floor and RRT was called this evening for unresponsive. Pt was transferred to CCU 6.  Pt made comfortable in bed, place on tele monitor and VSS. NSR on tele, Pt opens eye to name, moans when turned. CHG completed and family was called back to room. Report to night shift RN Doctor, hospital

## 2023-07-21 NOTE — Progress Notes (Signed)
Eeg done 

## 2023-07-21 NOTE — Progress Notes (Signed)
Rapid called on on Pt  because he was unresponsive to verbal commands,but responsive to painful stimuli. MD came to bedside new orders placed

## 2023-07-22 ENCOUNTER — Inpatient Hospital Stay: Payer: No Typology Code available for payment source

## 2023-07-22 DIAGNOSIS — R509 Fever, unspecified: Secondary | ICD-10-CM

## 2023-07-22 DIAGNOSIS — I609 Nontraumatic subarachnoid hemorrhage, unspecified: Secondary | ICD-10-CM | POA: Diagnosis not present

## 2023-07-22 DIAGNOSIS — K7460 Unspecified cirrhosis of liver: Secondary | ICD-10-CM | POA: Diagnosis not present

## 2023-07-22 DIAGNOSIS — G038 Meningitis due to other specified causes: Secondary | ICD-10-CM

## 2023-07-22 DIAGNOSIS — S065XAA Traumatic subdural hemorrhage with loss of consciousness status unknown, initial encounter: Secondary | ICD-10-CM | POA: Diagnosis not present

## 2023-07-22 DIAGNOSIS — G9782 Other postprocedural complications and disorders of nervous system: Secondary | ICD-10-CM | POA: Diagnosis not present

## 2023-07-22 LAB — HEPATIC FUNCTION PANEL
ALT: 43 U/L (ref 0–44)
AST: 51 U/L — ABNORMAL HIGH (ref 15–41)
Albumin: 2.6 g/dL — ABNORMAL LOW (ref 3.5–5.0)
Alkaline Phosphatase: 78 U/L (ref 38–126)
Bilirubin, Direct: 0.5 mg/dL — ABNORMAL HIGH (ref 0.0–0.2)
Indirect Bilirubin: 1.9 mg/dL — ABNORMAL HIGH (ref 0.3–0.9)
Total Bilirubin: 2.4 mg/dL — ABNORMAL HIGH (ref 0.3–1.2)
Total Protein: 5.6 g/dL — ABNORMAL LOW (ref 6.5–8.1)

## 2023-07-22 LAB — BASIC METABOLIC PANEL
Anion gap: 9 (ref 5–15)
BUN: 13 mg/dL (ref 8–23)
CO2: 22 mmol/L (ref 22–32)
Calcium: 8.3 mg/dL — ABNORMAL LOW (ref 8.9–10.3)
Chloride: 100 mmol/L (ref 98–111)
Creatinine, Ser: 0.76 mg/dL (ref 0.61–1.24)
GFR, Estimated: >60 mL/min (ref >60)
Glucose, Bld: 147 mg/dL — ABNORMAL HIGH (ref 70–99)
Potassium: 3.8 mmol/L (ref 3.5–5.1)
Sodium: 131 mmol/L — ABNORMAL LOW (ref 135–145)

## 2023-07-22 LAB — CBC
HCT: 29.8 % — ABNORMAL LOW (ref 39.0–52.0)
Hemoglobin: 10.5 g/dL — ABNORMAL LOW (ref 13.0–17.0)
MCH: 32.9 pg (ref 26.0–34.0)
MCHC: 35.2 g/dL (ref 30.0–36.0)
MCV: 93.4 fL (ref 80.0–100.0)
Platelets: 50 10*3/uL — ABNORMAL LOW (ref 150–400)
RBC: 3.19 MIL/uL — ABNORMAL LOW (ref 4.22–5.81)
RDW: 14.8 % (ref 11.5–15.5)
WBC: 4.6 10*3/uL (ref 4.0–10.5)
nRBC: 0 % (ref 0.0–0.2)

## 2023-07-22 LAB — PROCALCITONIN: Procalcitonin: <0.1 ng/mL

## 2023-07-22 LAB — GLUCOSE, CAPILLARY
Glucose-Capillary: 135 mg/dL — ABNORMAL HIGH (ref 70–99)
Glucose-Capillary: 148 mg/dL — ABNORMAL HIGH (ref 70–99)
Glucose-Capillary: 122 mg/dL — ABNORMAL HIGH (ref 70–99)

## 2023-07-22 LAB — MAGNESIUM: Magnesium: 1.6 mg/dL — ABNORMAL LOW (ref 1.7–2.4)

## 2023-07-22 MED ORDER — DIPHENHYDRAMINE HCL 50 MG/ML IJ SOLN
25.0000 mg | Freq: Once | INTRAMUSCULAR | Status: AC
  Administered 2023-07-22: 25 mg via INTRAVENOUS
  Filled 2023-07-22: qty 1

## 2023-07-22 MED ORDER — PROCHLORPERAZINE EDISYLATE 10 MG/2ML IJ SOLN
5.0000 mg | Freq: Once | INTRAMUSCULAR | Status: AC
  Administered 2023-07-22: 5 mg via INTRAVENOUS
  Filled 2023-07-22: qty 2

## 2023-07-22 MED ORDER — GERHARDT'S BUTT CREAM
TOPICAL_CREAM | Freq: Three times a day (TID) | CUTANEOUS | Status: DC
  Administered 2023-07-23 – 2023-07-31 (×4): 1 via TOPICAL
  Filled 2023-07-22: qty 1

## 2023-07-22 MED ORDER — ACETAMINOPHEN 10 MG/ML IV SOLN
1000.0000 mg | Freq: Four times a day (QID) | INTRAVENOUS | Status: AC | PRN
  Administered 2023-07-22: 1000 mg via INTRAVENOUS
  Filled 2023-07-22: qty 100

## 2023-07-22 MED ORDER — VANCOMYCIN HCL 1250 MG/250ML IV SOLN
1250.0000 mg | Freq: Two times a day (BID) | INTRAVENOUS | Status: AC
  Administered 2023-07-23 – 2023-07-29 (×14): 1250 mg via INTRAVENOUS
  Filled 2023-07-22 (×14): qty 250

## 2023-07-22 MED ORDER — MAGNESIUM SULFATE 2 GM/50ML IV SOLN
2.0000 g | Freq: Once | INTRAVENOUS | Status: AC
  Administered 2023-07-22: 2 g via INTRAVENOUS
  Filled 2023-07-22: qty 50

## 2023-07-22 MED ORDER — SODIUM CHLORIDE 0.9 % IV SOLN: INTRAVENOUS | Status: AC

## 2023-07-22 MED ORDER — SODIUM CHLORIDE 0.9 % IV SOLN
2.0000 g | Freq: Three times a day (TID) | INTRAVENOUS | Status: DC
  Administered 2023-07-22 – 2023-07-23 (×2): 2 g via INTRAVENOUS
  Filled 2023-07-22 (×3): qty 12.5

## 2023-07-22 MED ORDER — VANCOMYCIN HCL 2000 MG/400ML IV SOLN
2000.0000 mg | Freq: Once | INTRAVENOUS | Status: AC
  Administered 2023-07-22: 2000 mg via INTRAVENOUS
  Filled 2023-07-22: qty 400

## 2023-07-22 MED ORDER — LACTULOSE ENEMA
300.0000 mL | Freq: Four times a day (QID) | ORAL | Status: DC
  Administered 2023-07-22 – 2023-07-23 (×2): 300 mL via RECTAL
  Filled 2023-07-22 (×7): qty 300

## 2023-07-22 NOTE — Plan of Care (Signed)
  Problem: Education: Goal: Knowledge of General Education information will improve Description: Including pain rating scale, medication(s)/side effects and non-pharmacologic comfort measures Outcome: Progressing   Problem: Clinical Measurements: Goal: Diagnostic test results will improve Outcome: Progressing   Problem: Coping: Goal: Level of anxiety will decrease Outcome: Progressing   Problem: Elimination: Goal: Will not experience complications related to urinary retention Outcome: Progressing   Problem: Pain Managment: Goal: General experience of comfort will improve Outcome: Progressing   Problem: Skin Integrity: Goal: Risk for impaired skin integrity will decrease Outcome: Progressing

## 2023-07-22 NOTE — Progress Notes (Signed)
SLP Cancellation Note  Patient Details Name: VRISHANK NEPOMUCENO MRN: [id number] DOB: Mar 04, 1946   Cancelled treatment:       Reason Eval/Treat Not Completed: Medical issues which prohibited therapy;Patient's level of consciousness (consulted NSG re: pt's status today)  Pt remains groggy w/ decreased alertness today unless given MOD-MAX verbal/tactile stim. NSG is providing oral care for stim of swallowing and hygiene. NSG is holding all po medications.   Will hold on repeat BSE to re-establish po's and reassess pt's status tomorrow. NSG agreed.      Jerilynn Som, MS, CCC-SLP Speech Language Pathologist Rehab Services; [facility] [phone] (ascom) , 07/22/2023, 2:21 PM

## 2023-07-22 NOTE — Progress Notes (Signed)
Pharmacy Antibiotic Note  Joshua Berry is a 77 y.o. male admitted on 07/14/2023 with meningitis.  Pharmacy has been consulted for vancomycin and cefepime dosing. Patient s/p elective neurosurgery bilateral burr holes on 7/29 for drainage of chronic SDH.  Bilateral ventriculostomy drains placed in OR and removed on 7/31.  Patient now having fever and altered mental status (PMH non-alcoholic cirrhosis with hepatic encephalopathy)  Today, 07/22/2023 Has not receive any recent antibiotics (other then rifiximin for hepatic encephalopathy) Tm/24h 102.8 WBC WNL (previously leukopenic) Renal: SCR WNL and at baseline 8/6 Procalcitonin < 0.1 Blood cultures collect 8/6 (prev blood cx from 7/29 were no growth)  Plan: Vancomycin 2gm IV x 1 then 1250mg  IV q12h  Goal vancomycin trough 15-68mcg/mL for CNS infection Estimated trough with this dose is 15.5 mcg/ml using an estimated CrCl of 25ml/min based on age.   Cefepime 2gm IV q8h Follow renal function and check trough at steady state if remains on vancomycin   Height: 6\' 2"  (188 cm) Weight: 101.7 kg (224 lb 3.3 oz) IBW/kg (Calculated) : 82.2  Temp (24hrs), Avg:100.2 F (37.9 C), Min:98.4 F (36.9 C), Max:102.8 F (39.3 C)  Recent Labs  Lab 07/18/23 0438 07/19/23 0528 07/20/23 0411 07/21/23 0403 07/22/23 0851  WBC 5.1 2.7* 2.9* 3.5* 4.6  CREATININE 0.72 0.71 0.67 0.70 0.76    Estimated Creatinine Clearance: 96.9 mL/min (by C-G formula based on SCr of 0.76 mg/dL).    Allergies  Allergen Reactions   Nsaids     Other Reaction(s): Ascites   Camphor Rash and Swelling   Nickel Rash   Sulfur Rash and Swelling   Sulfamethoxazole Swelling   Latex Rash   Lisinopril Hives    Other Reaction(s): Cough    Antimicrobials this admission: 8/6 vanco >>  8/6 cefepime >>   Dose adjustments this admission:   Microbiology results: 8/6 BCx: IP 7/29 BCx: NG-F 7/29 MRSA PCR: neg  Thank you for allowing pharmacy to be a part of this patient's  care.  Juliette Alcide, PharmD, BCPS, BCIDP Work Cell: [phone] 07/22/2023 2:39 PM

## 2023-07-22 NOTE — Progress Notes (Signed)
PT Cancellation Note  Patient Details Name: Joshua Berry MRN: [id number] DOB: 07-Oct-1946   Cancelled Treatment:    Reason Eval/Treat Not Completed: Patient not medically ready (Patient has had a change in medical status with rapid response yesterday due to altered mental status with transfer to ICU, medical work up ongoing. No continue at transfer orders. Please re-consult PT when appropriate.)  Donna Bernard, PT, MPT  [name] 07/22/2023, 11:58 AM

## 2023-07-22 NOTE — Progress Notes (Addendum)
   Neurosurgery Progress Note  History: Joshua Berry is a 77 year old presenting with bilateral subdural hematomas status post bilateral bur hole washout.  POD 8: Pt experienced AMS overnight prompting transfer to the ICU  POD3: Pt complaining of headache this morning POD2: Patient is not complaining, but having less speech output. POD1: Patient have some improvement in his headaches and neurologic status overnight.  Physical Exam: Vitals:   07/22/23 0600 07/22/23 0750  BP: (!) 123/57   Pulse: 71   Resp: 15   Temp:  99.3 F (37.4 C)  SpO2: 97%     Drowsy but arouses to voice. Oriented to self and place. No overt drift. PERRL. Does not spontaneously open his eyes Incisions c/d/I with staples in place Patient was seen on evening rounds and checked for signs of meningismus.  No difficulty with passive range of motion of his neck to flexion extension or rotation.  Data: CT head 07/22/23  IMPRESSION: Unchanged appearance of bifrontal pneumocephalus and bilateral hemispheric, predominantly isodense subdural hematomas.     Electronically Signed   By: Deatra Robinson M.D.   On: 07/21/2023 21:21  Assessment/Plan:  Joshua Berry is a 77 year old presenting with altered mental status and headaches found to have bilateral subdural hematomas status post bilateral bur hole for washout.  He had an episode of AMS yesterday prompting transfer back to the ICU.   - q4 hour neuro checks - Neurology re-engaged for AMS; EMG without signs of seizures -Would recommend correcting acute hyponatremia. Consider salt tabs if appropriate - Will remove staples on 8/8; please keep incisions covered with daily dressing changes. - please call with any questions or concerns  Manning Charity PA-C Department of Neurosurgery

## 2023-07-22 NOTE — Progress Notes (Signed)
Patient has new skin irritation from excessive amount of bowel movements s/t lactulose. Stool leakage around flexi seal continuously. MD notified. Orders changed to decrease lactulose administration to help with skin integrity.

## 2023-07-22 NOTE — Plan of Care (Signed)
  Problem: Education: Goal: Knowledge of General Education information will improve Description: Including pain rating scale, medication(s)/side effects and non-pharmacologic comfort measures Outcome: Not Progressing   Problem: Health Behavior/Discharge Planning: Goal: Ability to manage health-related needs will improve Outcome: Not Progressing   Problem: Clinical Measurements: Goal: Ability to maintain clinical measurements within normal limits will improve Outcome: Not Progressing Goal: Will remain free from infection Outcome: Not Progressing Goal: Diagnostic test results will improve Outcome: Not Progressing Goal: Respiratory complications will improve Outcome: Not Progressing Goal: Cardiovascular complication will be avoided Outcome: Not Progressing   Problem: Activity: Goal: Risk for activity intolerance will decrease Outcome: Not Progressing   Problem: Nutrition: Goal: Adequate nutrition will be maintained Outcome: Not Progressing   Problem: Coping: Goal: Level of anxiety will decrease Outcome: Not Progressing   Problem: Elimination: Goal: Will not experience complications related to bowel motility Outcome: Not Progressing Goal: Will not experience complications related to urinary retention Outcome: Not Progressing   Problem: Pain Managment: Goal: General experience of comfort will improve Outcome: Not Progressing   Problem: Safety: Goal: Ability to remain free from injury will improve Outcome: Not Progressing   Problem: Skin Integrity: Goal: Risk for impaired skin integrity will decrease Outcome: Not Progressing   Problem: Education: Goal: Ability to describe self-care measures that may prevent or decrease complications (Diabetes Survival Skills Education) will improve Outcome: Not Progressing Goal: Individualized Educational Video(s) Outcome: Not Progressing   Problem: Coping: Goal: Ability to adjust to condition or change in health will  improve Outcome: Not Progressing   Problem: Fluid Volume: Goal: Ability to maintain a balanced intake and output will improve Outcome: Not Progressing   Problem: Health Behavior/Discharge Planning: Goal: Ability to identify and utilize available resources and services will improve Outcome: Not Progressing Goal: Ability to manage health-related needs will improve Outcome: Not Progressing   Problem: Metabolic: Goal: Ability to maintain appropriate glucose levels will improve Outcome: Not Progressing   Problem: Nutritional: Goal: Maintenance of adequate nutrition will improve Outcome: Not Progressing Goal: Progress toward achieving an optimal weight will improve Outcome: Not Progressing   Problem: Skin Integrity: Goal: Risk for impaired skin integrity will decrease Outcome: Not Progressing   Problem: Tissue Perfusion: Goal: Adequacy of tissue perfusion will improve Outcome: Not Progressing   Pt returned to ICU/SD d/t increased somnolence, difficulty arousing, and noted fever upon arrival to unit. When asked orientation questions, pt initially only answering unfortunately and not following commands in BLE. Pt opens eyes to voice. Pt taken down for head CT per orders without difficulties. Family at bedside. Initially pt covered with multiple blankets, which were removed and PRN Tylenol suppository ordered and administered for fever with improvement in temperature. S/p administration of Tylenol and allowing for time for medication to be absorbed, Lactulose enema administered as ordered. Pt noted to have large liquid BM s/p enema requiring a full bed change. At 2315, pt more alert and oriented to self and place and wiggling toes on BLE. Remains on cardiac monitoring. Call bell within reach. Pt's family updated prior to leaving for the night and answered questions to the best of my abilities.

## 2023-07-22 NOTE — Consult Note (Addendum)
NAME: Joshua Berry  DOB: 1946/01/12  MRN: [id number]  Date/Time: 07/22/2023 2:10 PM  REQUESTING PROVIDER: Dr.Lai Subjective:  REASON FOR CONSULT: fever r/o meningitis ? Joshua Berry is a 77 y.o. 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 Staph capri bacteremia in June 2023 treated with 4 weeks of Iv cefazolin, recent streptococcus anginosus bacteremia in April 2024 ( neg TEE, Neg MRI thoracic spine and rt shoulder) and received a total of 3 weeks of antibiotic, was in [facility] 7/1-7/3 for hepatic encephalopathy resolved Presented to ED on 07/14/23 from [place] with Altered Mental status,   Vital sin the ED  07/14/23 09:16  BP 127/58 !  Temp 98.7 F (37.1 C)  Pulse Rate 83  Resp 22 !  SpO2 97 %    Latest Reference Range & Units 07/14/23 09:15  WBC 4.0 - 10.5 K/uL 3.4 (L)  Hemoglobin 13.0 - 17.0 g/dL 32.9 (L)  HCT 51.8 - 84.1 % 32.1 (L)  Platelets 150 - 400 K/uL 43 (L) [1]  Creatinine 0.61 - 1.24 mg/dL 6.60   CT head showed b/l subdural hematoma with midline shift and signs of herniation He underwent on 07/14/2023 bilateral frontal burr hole for drainage of subdural hematoma and placement of drains.  Next day patient was awake and alert and following commands.  On 07/16/2023 he was found to be a little lethargic and not responding to questions very well.  Neurosurgery had to remove the drains because it was draining spinal fluid.  He underwent another CT which showed Right subdural hematoma to be stable with thickness of 7 mm and the left the subdural blood and fluid was overall stable but there was a slight increase in acute subdural blood at about the level of the left subsided bur hole.  An EEG was done which did  not reveal any seizures. He was seen by neurologist.  She was concerned about seizures and started him on Keppra.  The next day 07/17/23 patient was better and his mentation had improved and he was talking smiling and asking to eat and drink.He had missed several days of lactulose because of being n.p.o. in the ICU and it was restarted on 8/2/-24  8/2 he was fine 8/3 he was okay  had a big bowel movt when lactulose was increased 8/4 was sleepy but arousable- was c/o headache, CT ead was done.  It showed residual bilateral subdural hematomas measuring up to 9 mm bilaterally.  No significant midline shift.  On 07/21/2023 patient was more lethargic and confused.  Had not had a bowel movement in the 24-hour. He had a fever of 102.9 at 8 PM. On 07/22/2023 blood cultures were sent And as there was a concern for neck pain and headache and previous issue of subdural drains being in the CSF he was started on vancomycin and cefepime to cover for nosocomial meningitis. I am asked to see the patient for the same.   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    Past Surgical History:  Procedure Laterality Date   BURR HOLE Bilateral 07/14/2023   Procedure: BURR HOLES;  Surgeon: Venetia Night, MD;  Location: [facility] ORS;  Service: Neurosurgery;  Laterality: Bilateral;   CHOLECYSTECTOMY     COLONOSCOPY N/A 07/23/2019   Procedure: COLONOSCOPY;  Surgeon: Toney Reil, MD;  Location: [facility] ENDOSCOPY;  Service: Gastroenterology;  Laterality: N/A;   ESOPHAGOGASTRODUODENOSCOPY N/A 07/20/2019   Procedure: ESOPHAGOGASTRODUODENOSCOPY (EGD);  Surgeon:  Toney Reil, MD;  Location: [facility] ENDOSCOPY;  Service: Gastroenterology;  Laterality: N/A;   ESOPHAGOGASTRODUODENOSCOPY Left 08/20/2019   Procedure: ESOPHAGOGASTRODUODENOSCOPY (EGD);  Surgeon: Pasty Spillers, MD;  Location: [facility] ENDOSCOPY;  Service: Endoscopy;  Laterality: Left;   ESOPHAGOGASTRODUODENOSCOPY (EGD) WITH PROPOFOL N/A 04/25/2021   Procedure: ESOPHAGOGASTRODUODENOSCOPY (EGD) WITH PROPOFOL;  Surgeon: Toney Reil, MD;  Location: [facility] ENDOSCOPY;  Service: Gastroenterology;  Laterality: N/A;   MECHANICAL AORTIC VALVE REPLACEMENT     SKIN BIOPSY     TEE WITHOUT CARDIOVERSION N/A 12/23/2018   Procedure: TRANSESOPHAGEAL ECHOCARDIOGRAM (TEE);  Surgeon: Dalia Heading, MD;  Location: [facility] ORS;  Service: Cardiovascular;  Laterality: N/A;   TEE WITHOUT CARDIOVERSION N/A 05/21/2022   Procedure: TRANSESOPHAGEAL ECHOCARDIOGRAM (TEE);  Surgeon: Antonieta Iba, MD;  Location: [facility] ORS;  Service: Cardiovascular;  Laterality: N/A;   TEE WITHOUT CARDIOVERSION N/A 04/09/2023   Procedure: TRANSESOPHAGEAL ECHOCARDIOGRAM;  Surgeon: Debbe Odea, MD;  Location: [facility] ORS;  Service: Cardiovascular;  Laterality: N/A;    Social History   Socioeconomic History   Marital status: Married    Spouse name: Delawrence Portis   Number of children: Not on file   Years of education: Not on file   Highest education level: Not on file  Occupational History   Not on file  Tobacco Use   Smoking status: Former    Current packs/day: 0.00    Average packs/day: 0.5 packs/day for 5.0 years (2.5 ttl pk-yrs)    Types: Cigarettes    Start date: 10/17/1963    Quit date: 10/16/1968    Years since quitting: 54.8   Smokeless tobacco: Never  Vaping Use   Vaping status: Never Used  Substance and Sexual Activity   Alcohol use: Not Currently   Drug use: Not Currently   Sexual activity: Not Currently  Other Topics Concern   Not on file  Social History Narrative   Not on file   Social Determinants of  Health   Financial Resource Strain: Not on file  Food Insecurity: No Food Insecurity (07/18/2023)   Hunger Vital Sign    Worried About Running Out of Food in the Last Year: Never true    Ran Out of Food in the Last Year: Never true  Transportation Needs: No Transportation Needs (07/18/2023)   PRAPARE - Administrator, Civil Service (Medical): No    Lack of Transportation (Non-Medical): No  Recent Concern: Transportation Needs - Unmet Transportation Needs (06/17/2023)   PRAPARE - Administrator, Civil Service (Medical): Yes    Lack of Transportation (Non-Medical): No  Physical Activity: Not on file  Stress: Not on file  Social Connections: Unknown (04/29/2022)   [facility] from [facility]   Social Network    Social Network: Not on file  Intimate Partner Violence: Not At Risk (  07/18/2023)   Humiliation, Afraid, Rape, and Kick questionnaire    Fear of Current or Ex-Partner: No    Emotionally Abused: No    Physically Abused: No    Sexually Abused: No    Family History  Problem Relation Age of Onset   Heart attack Brother    Allergies  Allergen Reactions   Nsaids     Other Reaction(s): Ascites   Camphor Rash and Swelling   Nickel Rash   Sulfur Rash and Swelling   Sulfamethoxazole Swelling   Latex Rash   Lisinopril Hives    Other Reaction(s): Cough   I? Current Facility-Administered Medications  Medication Dose Route Frequency Provider Last Rate Last Admin   0.9 %  sodium chloride infusion  10 mL/hr Intravenous Once Venetia Night, MD       acetaminophen (TYLENOL) tablet 650 mg  650 mg Oral Q6H PRN Andris Baumann, MD   650 mg at 07/21/23 2054   Or   acetaminophen (TYLENOL) suppository 650 mg  650 mg Rectal Q6H PRN Andris Baumann, MD       butalbital-acetaminophen-caffeine (FIORICET) [phone] MG per tablet 2 tablet  2 tablet Oral Q8H PRN [name], MD   2 tablet at 07/21/23 1256   Chlorhexidine Gluconate Cloth 2 % PADS 6 each  6 each  Topical Daily Raechel Chute, MD   6 each at 07/22/23 0825   clotrimazole (LOTRIMIN) 1 % cream   Topical BID [name], MD   Given at 07/21/23 2106   feeding supplement (BOOST / RESOURCE BREEZE) liquid 1 Container  1 Container Oral TID BM [name], MD   1 Container at 07/21/23 1415   Gerhardt's butt cream   Topical TID [name], MD   Given at 07/22/23 1120   hydrALAZINE (APRESOLINE) injection 10 mg  10 mg Intravenous Q4H PRN Paliwal, Aditya, MD       insulin aspart (novoLOG) injection 0-6 Units  0-6 Units Subcutaneous TID WC [name], MD   1 Units at 07/21/23 1646   labetalol (NORMODYNE) injection 10 mg  10 mg Intravenous Q2H PRN Paliwal, Aditya, MD       lactulose (CHRONULAC) enema 200 gm  300 mL Rectal QID [name], MD       levETIRAcetam (KEPPRA) IVPB 1000 mg/100 mL premix  1,000 mg Intravenous Q12H [name], MD   Stopped at 07/22/23 0835   levothyroxine (SYNTHROID) tablet 100 mcg  100 mcg Oral Q0600 [name], MD   100 mcg at 07/21/23 0558   melatonin tablet 10 mg  10 mg Oral QHS [name], MD   10 mg at 07/20/23 2103   multivitamin with minerals tablet 1 tablet  1 tablet Oral Daily [name], MD   1 tablet at 07/21/23 5621   Oral care mouth rinse  15 mL Mouth Rinse 4 times per day Raechel Chute, MD   15 mL at 07/22/23 1121   Oral care mouth rinse  15 mL Mouth Rinse PRN Raechel Chute, MD   15 mL at 07/15/23 0400   pantoprazole (PROTONIX) EC tablet 40 mg  40 mg Oral BID [name], MD   40 mg at 07/21/23 3086   polyethylene glycol (MIRALAX / GLYCOLAX) packet 17 g  17 g Oral Daily PRN Rust-Chester, Cecelia Byars, NP       polyvinyl alcohol (LIQUIFILM TEARS) 1.4 % ophthalmic solution 1 drop  1 drop Both Eyes PRN [name], MD   1 drop at 07/19/23 [phone]  rifaximin (XIFAXAN) tablet 550 mg  550 mg Oral BID [name], MD   550 mg at 07/21/23 1601   terazosin (HYTRIN) capsule 5 mg  5 mg Oral QHS [name], MD   5 mg at 07/20/23 2103     Abtx:  Anti-infectives (From admission, onward)    Start      Dose/Rate Route Frequency Ordered Stop   07/18/23 1400  rifaximin (XIFAXAN) tablet 550 mg       Note to Pharmacy: Takes with lactulose     550 mg Oral 2 times daily 07/18/23 1301         REVIEW OF SYSTEMS:  NA Objective:  VITALS:  BP (!) 143/51   Pulse 72   Temp 100.1 F (37.8 C) (Axillary)   Resp 17   Ht 6\' 2"  (1.88 m)   Wt 101.7 kg   SpO2 97%   BMI 28.79 kg/m   PHYSICAL EXAM:  General: obtunded- opens eyes to calling hs name. Jerolyn Shin gives one word answer like yes Head: surgical burr holes are covered with dressing Eyes: pupils are pin point ENT cannot examine Tongue very dry Neck: cannot check for suppleness Lungs: b/l air entry- decreased bases Heart: tachycardia Abdomen: Soft, non-tender,not distended. Bowel sounds normal. No masses Extremities: atraumatic, no cyanosis. Minimal ankle edema Skin: No rashes or lesions. Or bruising Lymph: Cervical, supraclavicular normal. Neurologic: cannot assess Pertinent Labs Lab Results CBC    Component Value Date/Time   WBC 4.6 07/22/2023 0851   RBC 3.19 (L) 07/22/2023 0851   HGB 10.5 (L) 07/22/2023 0851   HGB 12.3 (L) 05/01/2014 1553   HCT 29.8 (L) 07/22/2023 0851   HCT 36.6 (L) 05/01/2014 1553   PLT 50 (L) 07/22/2023 0851   PLT 72 (L) 05/01/2014 1553   MCV 93.4 07/22/2023 0851   MCV 96 05/01/2014 1553   MCH 32.9 07/22/2023 0851   MCHC 35.2 07/22/2023 0851   RDW 14.8 07/22/2023 0851   RDW 15.8 (H) 05/01/2014 1553   LYMPHSABS 0.6 (L) 07/15/2023 2139   MONOABS 0.6 07/15/2023 2139   EOSABS 0.0 07/15/2023 2139   BASOSABS 0.0 07/15/2023 2139       Latest Ref Rng & Units 07/22/2023    8:51 AM 07/21/2023    4:03 AM 07/20/2023    4:11 AM  CMP  Glucose 70 - 99 mg/dL 093  235  573   BUN 8 - 23 mg/dL 13  13  16    Creatinine 0.61 - 1.24 mg/dL 2.20  2.54  2.70   Sodium 135 - 145 mmol/L 131  133  136   Potassium 3.5 - 5.1 mmol/L 3.8  3.5  3.5   Chloride 98 - 111 mmol/L 100  102  105   CO2 22 - 32 mmol/L 22  24  23     Calcium 8.9 - 10.3 mg/dL 8.3  8.3  8.6       Microbiology: Recent Results (from the past 240 hour(s))  Culture, blood (Routine x 2)     Status: None   Collection Time: 07/14/23  9:15 AM   Specimen: BLOOD  Result Value Ref Range Status   Specimen Description BLOOD RIGHT FA  Final   Special Requests   Final    BOTTLES DRAWN AEROBIC AND ANAEROBIC Blood Culture adequate volume   Culture   Final    NO GROWTH 5 DAYS Performed at [facility], [address], [place], [place] [address]    Report Status 07/19/2023 FINAL  Final  SARS Coronavirus 2 by RT PCR (hospital order, performed in [facility] hospital lab) *cepheid single result test* Anterior Nasal Swab     Status: None   Collection Time: 07/14/23  9:27 AM   Specimen: Anterior Nasal Swab  Result Value Ref Range Status   SARS Coronavirus 2 by RT PCR NEGATIVE NEGATIVE Final    Comment: (NOTE) SARS-CoV-2 target nucleic acids are NOT DETECTED.  The SARS-CoV-2 RNA is generally detectable in upper and lower respiratory specimens during the acute phase of infection. The lowest concentration of SARS-CoV-2 viral copies this assay can detect is 250 copies / mL. A negative result does not preclude SARS-CoV-2 infection and should not be used as the sole basis for treatment or other patient management decisions.  A negative result may occur with improper specimen collection / handling, submission of specimen other than nasopharyngeal swab, presence of viral mutation(s) within the areas targeted by this assay, and inadequate number of viral copies (<250 copies / mL). A negative result must be combined with clinical observations, patient history, and epidemiological information.  Fact Sheet for Patients:   RoadLapTop.co.za  Fact Sheet for Healthcare Providers: http://kim-miller.com/  This test is not yet approved or  cleared by the [place] FDA and has been authorized for  detection and/or diagnosis of SARS-CoV-2 by FDA under an Emergency Use Authorization (EUA).  This EUA will remain in effect (meaning this test can be used) for the duration of the COVID-19 declaration under Section 564(b)(1) of the Act, 21 U.S.C. section 360bbb-3(b)(1), unless the authorization is terminated or revoked sooner.  Performed at [facility], [address] [place], [place] [address]   Culture, blood (Routine x 2)     Status: None   Collection Time: 07/14/23  9:29 AM   Specimen: BLOOD  Result Value Ref Range Status   Specimen Description BLOOD LEFT HAND  Final   Special Requests   Final    BOTTLES DRAWN AEROBIC AND ANAEROBIC Blood Culture adequate volume   Culture   Final    NO GROWTH 5 DAYS Performed at [facility], [address] [place], [place] [address]    Report Status 07/19/2023 FINAL  Final  MRSA Next Gen by PCR, Nasal     Status: None   Collection Time: 07/14/23  7:50 PM   Specimen: Nasal Mucosa; Nasal Swab  Result Value Ref Range Status   MRSA by PCR Next Gen NOT DETECTED NOT DETECTED Final    Comment: (NOTE) The GeneXpert MRSA Assay (FDA approved for NASAL specimens only), is one component of a comprehensive MRSA colonization surveillance program. It is not intended to diagnose MRSA infection nor to guide or monitor treatment for MRSA infections. Test performance is not FDA approved in patients less than 62 years old. Performed at [facility], 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 ? ? ___________________________________________________ Discussed with son, daughter in law and  requesting provider Note:  This document was prepared using Dragon voice recognition software and may include unintentional dictation errors.

## 2023-07-22 NOTE — Progress Notes (Signed)
Nutrition Follow Up Note   DOCUMENTATION CODES:   Not applicable  INTERVENTION:   RD will add supplements with diet advancement   Recommend NGT placement and nutrition support if pt's mental status does not improve and he is unable to take in oral intake   Pt remains at high refeed risk  Daily weights   NUTRITION DIAGNOSIS:   Inadequate oral intake related to acute illness as evidenced by per patient/family report. 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  Pt transferred to the ICU overnight for worsening mental status. Pt initiated on an oral diet 8/1. Pt consuming 50-75% of meals up until yesterday; pt more lethargic and only eating sips/bites of lunch and dinner. Pt lethargic today and is unable to take in any oral intake. SLP visited pt's room today but was unable to evaluate pt secondary to mental status. RD will add supplements with diet advancement. Would recommend NGT and nutrition support if patient is unable to be initiated on a diet by tomorrow. Pt remains at refeed risk. Per chart, pt has remained stable since admission.   Medications reviewed and include: insulin, lactulose, synthroid, melatonin, MVI, protonix, xifaxan, cefepime, vancomycin   Labs reviewed: Na 131(L), K 3.8 wnl, Mg 1.6(L) Ammonia- 57(H)- 8/5 Hgb 10.5(L), Hct 29.8(L) Cbgs- 148, 135 x 24 hrs  Diet Order:   Diet Order             Diet NPO time specified  Diet effective now                  EDUCATION NEEDS:   No education needs have been identified at this time  Skin:  Skin  Assessment: Reviewed RN Assessment (incision head)  Last BM:  8/6- type 7  Height:   Ht Readings from Last 1 Encounters:  07/14/23 6\' 2"  (1.88 m)    Weight:   Wt Readings from Last 1 Encounters:  07/22/23 101.7 kg    Ideal Body Weight:  86.3 kg  BMI:  Body mass index is 28.79 kg/m.  Estimated Nutritional Needs:   Kcal:  2000-2300kcal/day  Protein:  100-115g/day  Fluid:  2.0-2.3L/day  Betsey Holiday MS, RD, LDN Please refer to [facility] for RD and/or RD on-call/weekend/after hours pager

## 2023-07-22 NOTE — Progress Notes (Signed)
PROGRESS NOTE    Joshua Berry  WUJ:[id number] DOB: 01-01-1946 DOA: 07/14/2023 PCP: [facility]  [id number]  LOS: 8 days   Brief hospital course:   Assessment & Plan: ZACHARIA HAGARTY is a 77 yo M with hx of NASH with liver cirrhosis, portal HTN, TIPS, GIB/GAVE, pancytopenia, OSA, TAVR, left shoulder replacement, multiple episodes of bacteremia requiring prolonged IV abx treatment who presented to [facility] ED from [facility] assisted [facility] via EMS for evaluation of altered mental status.   ED workup revealed bilateral subdural hematomas with midline shift and signs of herniation. Family in agreement with surgical intervention. NSGY administered platelets and FFP to improve coagulopathy then brought patient to OR for hematoma evacuation.   Pt was in ICU managed by PCCM, then transferred to hospitalist service on 7/31.   Acute Bilateral Subdural Hematomas  s/p bilateral frontal burr holes --drain removed. --s/p plt and FFP.  No need for further supplementation. --repeat CT head stable --No further intervention planned, per neurosurgery  AMS  Acute metabolic encephalopathy --multiple etiologies, including subdural hematoma, possible seizure, hepatic encephalopathy, infection. --neuro was consulted for persistent somnolence.  EEG neg for seizure, however, pt was started on Keppra with improvement in mental status the next day. --mental status worsened again on 8/5, also with fever.  Started treatment for hepatic encephalopathy and meningitis as below.  Also empirically increased Keppra from 500 mg BID to 1g BID.  Acute hepatic encephalopathy - Pt missed several days of lactulose while NPO in ICU.  Lactulose and rifaximin resumed on 8/2, with 1 large BM on 8/2, however, no more BM since then even though pt received 4 doses of lactulose per day. --Pt more lethargic and confused on 8/5.  Pt moved back to stepdown for closer monitoring, and started on lactulose enema, with subsequent  large amount of liquid BM's.  Rectal tube inserted due to skin breakdown. Plan: --cont lactulose enema QID.  Can transition to oral if pt becomes more alert. --cont rifaximin when pt safe to take oral  Fever --fever started night of 8/5.  Blood cx obtained.  Since pt had a subdural drain that had drained spinal fluid, there is concern for noscomial meningitis. --discussed with neurosurgery, who deemed LP to carry too much risk for this pt.  Decision made to start tx for meningitis empirically. Plan: --ID consult today --start vanc and cefepime for empiric tx of meningitis  Acute urinary retention --Foley placed while in ICU. 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 --s/p plt and FFP  --No need for further plt and FFP supplementation, per neurosurgery  OSA Was on CPAP historically but not recently   Hypothyroidism --cont home Synthroid   Type 2 Diabetes Mellitus --recent A1c 5.4 about a month ago --SSI   GERD  --cont home PPI   Dyslipidemia --resume home statin after discharge  Persistent headache --repeat head CT 8/4 due to persistent headache, read as stable   DVT prophylaxis: SCD/Compression stockings Code Status: DNR  Family Communication: son and wife updated at bedside today Level of care: Stepdown Dispo:   The patient is from: ALF Anticipated d/c is to: SNF rehab Anticipated d/c date is: undetermined.  On IV abx for meningitis.     Subjective and Interval History:  Pt started having profuse liquid stool after lactulose enema, rectal tube inserted.  Pt was still somnolent, but more  responsive.  Still complained of headache, and son noted pt rubbing his neck.    Pt started having fever last night.  Due to recent subdural drain and was outputting spinal fluid, abx started for concern  for meningitis.      Objective: Vitals:   07/22/23 1300 07/22/23 1400 07/22/23 1600 07/22/23 1820  BP:  125/62 (!) 135/56 (!) 141/41  Pulse:  71 77 81  Resp:  15 15 16   Temp: 100.1 F (37.8 C)  (!) 100.7 F (38.2 C)   TempSrc: Axillary  Oral   SpO2:  96% 97% 95%  Weight:      Height:        Intake/Output Summary (Last 24 hours) at 07/22/2023 1846 Last data filed at 07/22/2023 1727 Gross per 24 hour  Intake 541.42 ml  Output 1065 ml  Net -523.58 ml   Filed Weights   07/20/23 0500 07/21/23 0500 07/22/23 0500  Weight: 106 kg 106.3 kg 101.7 kg    Examination:   Constitutional: NAD, lethargic, opens eyes to voice CV: No cyanosis.   RESP: normal respiratory effort, on RA SKIN: warm, dry   Data Reviewed: I have personally reviewed labs and imaging studies  Time spent: 50 minutes  [name], MD [facility] If 7PM-7AM, please contact night-coverage 07/22/2023, 6:46 PM

## 2023-07-23 ENCOUNTER — Inpatient Hospital Stay: Payer: No Typology Code available for payment source

## 2023-07-23 DIAGNOSIS — G935 Compression of brain: Secondary | ICD-10-CM | POA: Diagnosis not present

## 2023-07-23 DIAGNOSIS — R7881 Bacteremia: Secondary | ICD-10-CM

## 2023-07-23 DIAGNOSIS — S065XAA Traumatic subdural hemorrhage with loss of consciousness status unknown, initial encounter: Secondary | ICD-10-CM | POA: Diagnosis not present

## 2023-07-23 DIAGNOSIS — R400 Somnolence: Secondary | ICD-10-CM | POA: Diagnosis not present

## 2023-07-23 DIAGNOSIS — K7460 Unspecified cirrhosis of liver: Secondary | ICD-10-CM | POA: Diagnosis not present

## 2023-07-23 DIAGNOSIS — B957 Other staphylococcus as the cause of diseases classified elsewhere: Secondary | ICD-10-CM

## 2023-07-23 DIAGNOSIS — I6201 Nontraumatic acute subdural hemorrhage: Principal | ICD-10-CM

## 2023-07-23 LAB — GLUCOSE, CAPILLARY
Glucose-Capillary: 107 mg/dL — ABNORMAL HIGH (ref 70–99)
Glucose-Capillary: 149 mg/dL — ABNORMAL HIGH (ref 70–99)
Glucose-Capillary: 160 mg/dL — ABNORMAL HIGH (ref 70–99)
Glucose-Capillary: 132 mg/dL — ABNORMAL HIGH (ref 70–99)

## 2023-07-23 LAB — AMMONIA: Ammonia: 63 umol/L — ABNORMAL HIGH (ref 9–35)

## 2023-07-23 MED ORDER — LORAZEPAM 2 MG/ML PO CONC
1.0000 mg | Freq: Once | ORAL | Status: AC
  Administered 2023-07-23: 1 mg via ORAL
  Filled 2023-07-23: qty 1

## 2023-07-23 MED ORDER — GADOBUTROL 1 MMOL/ML IV SOLN
10.0000 mL | Freq: Once | INTRAVENOUS | Status: AC | PRN
  Administered 2023-07-23: 10 mL via INTRAVENOUS

## 2023-07-23 MED ORDER — VITAMIN C 500 MG PO TABS
500.0000 mg | ORAL_TABLET | Freq: Two times a day (BID) | ORAL | Status: DC
  Administered 2023-07-23 – 2023-08-01 (×18): 500 mg via ORAL
  Filled 2023-07-23 (×18): qty 1

## 2023-07-23 MED ORDER — ENSURE ENLIVE PO LIQD
237.0000 mL | Freq: Three times a day (TID) | ORAL | Status: DC
  Administered 2023-07-23 – 2023-08-01 (×18): 237 mL via ORAL

## 2023-07-23 MED ORDER — LACTULOSE 10 GM/15ML PO SOLN
30.0000 g | Freq: Three times a day (TID) | ORAL | Status: DC
  Administered 2023-07-23 – 2023-07-24 (×2): 30 g via ORAL
  Filled 2023-07-23 (×2): qty 60

## 2023-07-23 NOTE — Progress Notes (Signed)
Date of Admission:  07/14/2023      ID: GREEN SCHERR is a 77 y.o. male  Principal Problem:   Subdural hematoma (HCC) Active Problems:   Cirrhosis of liver without ascites (HCC)   Compression of brain (HCC)   Uncal herniation (HCC)   Acute nontraumatic intracranial subdural hematoma (HCC)    Subjective: Awake and moe alert Says he is feeling okay  Medications:   Chlorhexidine Gluconate Cloth  6 each Topical Daily   clotrimazole   Topical BID   Gerhardt's butt cream   Topical TID   insulin aspart  0-6 Units Subcutaneous TID WC   lactulose  300 mL Rectal QID   levothyroxine  100 mcg Oral Q0600   melatonin  10 mg Oral QHS   multivitamin with minerals  1 tablet Oral Daily   mouth rinse  15 mL Mouth Rinse 4 times per day   pantoprazole  40 mg Oral BID   rifaximin  550 mg Oral BID   terazosin  5 mg Oral QHS    Objective: Vital signs in last 24 hours: Patient Vitals for the past 24 hrs:  BP Temp Temp src Pulse Resp SpO2 Weight  07/23/23 0500 (!) 140/56 -- -- (!) 57 11 97 % 102.9 kg  07/23/23 0400 129/74 -- -- (!) 53 17 99 % --  07/23/23 0300 (!) 139/52 -- -- (!) 56 13 98 % --  07/23/23 0200 (!) 127/51 -- -- (!) 53 13 98 % --  07/23/23 0159 (!) 127/51 98.2 F (36.8 C) Oral (!) 56 16 97 % --  07/23/23 0100 (!) 136/57 -- -- (!) 58 10 99 % --  07/23/23 0000 (!) 123/59 -- -- 60 15 98 % --  07/22/23 2325 -- -- -- (!) 54 15 96 % --  07/22/23 2300 (!) 114/54 -- -- 60 15 90 % --  07/22/23 2200 (!) 128/112 (!) 100.7 F (38.2 C) Oral 77 19 96 % --  07/22/23 2100 (!) 139/58 -- -- 72 15 96 % --  07/22/23 2000 130/60 -- -- 78 17 97 % --  07/22/23 1946 -- (!) 101.9 F (38.8 C) -- -- -- -- --  07/22/23 1820 (!) 141/41 -- -- 81 16 95 % --  07/22/23 1600 (!) 135/56 (!) 100.7 F (38.2 C) Oral 77 15 97 % --  07/22/23 1400 125/62 -- -- 71 15 96 % --  07/22/23 1300 -- 100.1 F (37.8 C) Axillary -- -- -- --  07/22/23 1200 (!) 143/51 -- -- 72 17 97 % --  07/22/23 1000 (!) 129/51 -- --  63 16 98 % --      PHYSICAL EXAM:  General: awake , no distress, some confusion Head:scalp dressing Eyes: pupils are small ENT Nares normal. No drainage or sinus tenderness. Lips, mucosa, and tongue normal. No Thrush Neck: Supple, symmetrical, no adenopathy, thyroid: non tender no carotid bruit and no JVD. Lungs: Clear to auscultation bilaterally. No Wheezing or Rhonchi. No rales. Heart: s1s2 Abdomen: Soft, non-tender,not distended. Bowel sounds normal. No masses Extremities: atraumatic, no cyanosis. No edema. No clubbing Skin: No rashes or lesions. Or bruising Lymph: Cervical, supraclavicular normal. Neurologic: Grossly non-focal  Lab Results    Latest Ref Rng & Units 07/23/2023    3:56 AM 07/22/2023    8:51 AM 07/21/2023    4:03 AM  CBC  WBC 4.0 - 10.5 K/uL 4.4  4.6  3.5   Hemoglobin 13.0 - 17.0 g/dL 16.1  09.6  9.4  Hematocrit 39.0 - 52.0 % 28.9  29.8  26.2   Platelets 150 - 400 K/uL 50  50  49        Latest Ref Rng & Units 07/23/2023    3:56 AM 07/22/2023    8:51 AM 07/21/2023    4:03 AM  CMP  Glucose 70 - 99 mg/dL 161  096  045   BUN 8 - 23 mg/dL 15  13  13    Creatinine 0.61 - 1.24 mg/dL 4.09  8.11  9.14   Sodium 135 - 145 mmol/L 132  131  133   Potassium 3.5 - 5.1 mmol/L 3.9  3.8  3.5   Chloride 98 - 111 mmol/L 102  100  102   CO2 22 - 32 mmol/L 22  22  24    Calcium 8.9 - 10.3 mg/dL 8.2  8.3  8.3   Total Protein 6.5 - 8.1 g/dL  5.6    Total Bilirubin 0.3 - 1.2 mg/dL  2.4    Alkaline Phos 38 - 126 U/L  78    AST 15 - 41 U/L  51    ALT 0 - 44 U/L  43        Microbiology: Bc- staph epidermidis X 4 Studies/Results: DG Chest Port 1 View  Result Date: 07/22/2023 CLINICAL DATA:  77 year old male with fever and sepsis. EXAM: PORTABLE CHEST 1 VIEW COMPARISON:  Portable chest 07/15/2023 and earlier. FINDINGS: Portable AP semi upright view at 0936 hours. Slightly lower lung volumes. Stable cardiac size and mediastinal contours. Sequelae of TAVR. Increased pulmonary  interstitium which most resembles vascular congestion appears unchanged since 07/15/2023. No pneumothorax, pleural effusion or confluent lung opacity. Partially visible left shoulder arthroplasty. No acute osseous abnormality identified. Visualized tracheal air column is within normal limits. IMPRESSION: Lower lung volumes with ongoing interstitial opacity favored due to vascular congestion. Mild interstitial edema is possible but no pneumonia or pleural effusion is identified. Electronically Signed   By: Odessa Fleming M.D.   On: 07/22/2023 12:56   CT HEAD WO CONTRAST ( )  Result Date: 07/21/2023 CLINICAL DATA:  Lethargy, postop EXAM: CT HEAD WITHOUT CONTRAST TECHNIQUE: Contiguous axial images were obtained from the base of the skull through the vertex without intravenous contrast. RADIATION DOSE REDUCTION: This exam was performed according to the departmental dose-optimization program which includes automated exposure control, adjustment of the mA and/or kV according to patient size and/or use of iterative reconstruction technique. COMPARISON:  07/20/2023 FINDINGS: Brain: Unchanged appearance of bifrontal pneumocephalus and bilateral hemispheric, predominantly isodense extra-axial collections. Small amount of hyperdense blood bilaterally is unchanged. No midline shift or other mass effect. Vascular: No hyperdense vessel or unexpected calcification. Skull: Right parietal and left frontal burr holes. Sinuses/Orbits: No acute finding. Other: None. IMPRESSION: Unchanged appearance of bifrontal pneumocephalus and bilateral hemispheric, predominantly isodense subdural hematomas. Electronically Signed   By: Deatra Robinson M.D.   On: 07/21/2023 21:21   EEG adult  Result Date: 07/21/2023 Jefferson Fuel, MD     07/21/2023  4:39 PM Routine EEG Report RASHAAD MAZZANTI is a 77 y.o. male with a history of seizure who is undergoing an EEG to evaluate for seizures. Report: This EEG was acquired with electrodes placed according to  the International 10-20 electrode system (including Fp1, Fp2, F3, F4, C3, C4, P3, P4, O1, O2, T3, T4, T5, T6, A1, A2, Fz, Cz, Pz). The following electrodes were missing or displaced: none. The occipital dominant rhythm was 3-5 Hz. This activity is reactive to  stimulation. Drowsiness was manifested by background fragmentation; deeper stages of sleep were identified by K complexes and sleep spindles. There was no focal slowing. There were no interictal epileptiform discharges. There were no electrographic seizures identified. Photic stimulation and hyperventilation were not performed. Impression and clinical correlation: This EEG was obtained while awake and asleep and is abnormal due to moderate to severe diffuse slowing indicative of global cerebral dysfunction. Epileptiform abnormalities were not seen during this recording. Bing Neighbors, MD [facility] [phone] If 7pm- 7am, please page neurology on call as listed in AMION.     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

## 2023-07-23 NOTE — Progress Notes (Signed)
Progress Note   [name]:[id number] DOB: 1946-12-16 DOA: 07/14/2023     9 DOS: the patient was seen and examined on 07/23/2023   Brief hospital course: Joshua Berry is a 77 yo M with hx of NASH with liver cirrhosis, portal HTN, TIPS, GIB/GAVE, pancytopenia, OSA, TAVR, left shoulder replacement, multiple episodes of bacteremia requiring prolonged IV abx treatment who presented to [facility] ED from [facility] via EMS for evaluation of altered mental status.    ED workup revealed bilateral subdural hematomas with midline shift and signs of herniation. Family in agreement with surgical intervention. NSGY administered platelets and FFP to improve coagulopathy then brought patient to OR for hematoma evacuation.    Patient was in ICU managed by PCCM, then transferred to hospitalist service on 7/31. 07/21/23 he spike fever, started on prophylactic meningitis treatment.  07/23/23 MRSE positive blood cultures. ID on board. Continue Vanco stopped Cefepime. MRI brain rule out abscess. Infected hematoma ordered.  Assessment and Plan: Acute Bilateral Subdural Hematomas  s/p bilateral frontal burr hole washout. Drains removed 8/1. S/p plt and FFP.  No need for further supplementation. Daily dressing, sutures to be removed 8/8, per neurosurgery. ID recommended MRI brain with and with out to rule out intracreanial infectious source given bacteremia.    AMS  Acute metabolic encephalopathy In the setting of subdural hematoma, possible seizure, hepatic encephalopathy, infection. Neuro was consulted for persistent somnolence.  EEG neg for seizure, on Keppra with improvement in mental status. AMS, fever 8/5- Started treatment for hepatic encephalopathy and meningitis. Continue vancomycin for MRSE blood cultures.  Stop cefepime. Today his mental status much better, more alert and able to follow commands. Continue neurochecks.   Acute hepatic encephalopathy Continue lactulose and  rifaximin therapy. Patient is transferred to stepdown for closer monitoring, and started on lactulose enema, with subsequent large amount of liquid BM's.  Rectal tube inserted due to skin breakdown. Continue lactulose enema QID.  Repeat ammonia ordered Continue rifaximin therapy as he is safe to take oral   Fever, AMS MRSE bacteremia Since pt had a subdural drain that had drained spinal fluid, there is concern for noscomial meningitis. Neurosurgery, who deemed LP to carry too much risk for this pt.   Decision made to start tx for meningitis empirically. Discussed with ID who recommended to stop cefepime, continue vancomycin, MRI brain with and without contrast ordered.   Acute urinary retention Status post Foley removed. 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 Continue lactulose, rifaximin therapy.  Recheck ammonia level. Continue to monitor neurochecks. Nursing supportive care.   OSA Not compliant with CPAP. Will order CPAP at night   Hypothyroidism Continue home dose Synthroid   Type 2 Diabetes Mellitus Sliding scale insulin as per floor protocol   GERD  Continue PPI therapy.   Dyslipidemia Resume statin at discharge.      Subjective: Patient is seen and examined today morning. He is lying comfortably, weak. More alert and awake, able to follow commands. Passed swallow eval at bedside. RN at bedside. He denies any complaints. Afebrile  Physical Exam: Vitals:   07/23/23 0701 07/23/23 0800 07/23/23 1000 07/23/23 1100  BP:  (!) 152/76 (!) 157/85 (!) 161/84  Pulse: 63  62 69  Resp: 12 11 13 14   Temp: 98.3 F (36.8 C)     TempSrc: Oral  SpO2: 97% 98% 97% 99%  Weight:      Height:       General - Elderly Caucasian weak male, no distress HEENT - PERRLA, EOMI, atraumatic head, non  tender sinuses. Lung - Clear, rales, rhonchi, wheezes. Heart - S1, S2 heard, no murmurs, rubs, trace pedal edema Neuro - Alert, awake and oriented, able to follow commands, non focal exam. Skin - Warm and dry. Data Reviewed:     Latest Ref Rng & Units 07/23/2023    3:56 AM 07/22/2023    8:51 AM 07/21/2023    4:03 AM  CBC  WBC 4.0 - 10.5 K/uL 4.4  4.6  3.5   Hemoglobin 13.0 - 17.0 g/dL 08.6  57.8  9.4   Hematocrit 39.0 - 52.0 % 28.9  29.8  26.2   Platelets 150 - 400 K/uL 50  50  49       Latest Ref Rng & Units 07/23/2023    3:56 AM 07/22/2023    8:51 AM 07/21/2023    4:03 AM  BMP  Glucose 70 - 99 mg/dL 469  629  528   BUN 8 - 23 mg/dL 15  13  13    Creatinine 0.61 - 1.24 mg/dL 4.13  2.44  0.10   Sodium 135 - 145 mmol/L 132  131  133   Potassium 3.5 - 5.1 mmol/L 3.9  3.8  3.5   Chloride 98 - 111 mmol/L 102  100  102   CO2 22 - 32 mmol/L 22  22  24    Calcium 8.9 - 10.3 mg/dL 8.2  8.3  8.3    DG Chest Port 1 View  Result Date: 07/22/2023 CLINICAL DATA:  77 year old male with fever and sepsis. EXAM: PORTABLE CHEST 1 VIEW COMPARISON:  Portable chest 07/15/2023 and earlier. FINDINGS: Portable AP semi upright view at 0936 hours. Slightly lower lung volumes. Stable cardiac size and mediastinal contours. Sequelae of TAVR. Increased pulmonary interstitium which most resembles vascular congestion appears unchanged since 07/15/2023. No pneumothorax, pleural effusion or confluent lung opacity. Partially visible left shoulder arthroplasty. No acute osseous abnormality identified. Visualized tracheal air column is within normal limits. IMPRESSION: Lower lung volumes with ongoing interstitial opacity favored due to vascular congestion. Mild interstitial edema is possible but no pneumonia or pleural effusion is identified. Electronically Signed   By: Odessa Fleming M.D.   On: 07/22/2023 12:56   CT HEAD WO CONTRAST ( )  Result Date: 07/21/2023 CLINICAL DATA:  Lethargy, postop EXAM: CT HEAD WITHOUT CONTRAST  TECHNIQUE: Contiguous axial images were obtained from the base of the skull through the vertex without intravenous contrast. RADIATION DOSE REDUCTION: This exam was performed according to the departmental dose-optimization program which includes automated exposure control, adjustment of the mA and/or kV according to patient size and/or use of iterative reconstruction technique. COMPARISON:  07/20/2023 FINDINGS: Brain: Unchanged appearance of bifrontal pneumocephalus and bilateral hemispheric, predominantly isodense extra-axial collections. Small amount of hyperdense blood bilaterally is unchanged. No midline shift or other mass effect. Vascular: No hyperdense vessel or unexpected calcification. Skull: Right parietal and left frontal burr holes. Sinuses/Orbits: No acute finding. Other: None. IMPRESSION: Unchanged appearance of bifrontal pneumocephalus and bilateral hemispheric, predominantly isodense subdural hematomas. Electronically Signed   By: Deatra Robinson M.D.   On: 07/21/2023 21:21   EEG adult  Result Date: 07/21/2023 Jefferson Fuel, MD     07/21/2023  4:39 PM Routine EEG Report Joshua Berry is a 77 y.o. male with a history of seizure who  is undergoing an EEG to evaluate for seizures. Report: This EEG was acquired with electrodes placed according to the International 10-20 electrode system (including Fp1, Fp2, F3, F4, C3, C4, P3, P4, O1, O2, T3, T4, T5, T6, A1, A2, Fz, Cz, Pz). The following electrodes were missing or displaced: none. The occipital dominant rhythm was 3-5 Hz. This activity is reactive to stimulation. Drowsiness was manifested by background fragmentation; deeper stages of sleep were identified by K complexes and sleep spindles. There was no focal slowing. There were no interictal epileptiform discharges. There were no electrographic seizures identified. Photic stimulation and hyperventilation were not performed. Impression and clinical correlation: This EEG was obtained while awake and  asleep and is abnormal due to moderate to severe diffuse slowing indicative of global cerebral dysfunction. Epileptiform abnormalities were not seen during this recording. Bing Neighbors, MD [facility] [phone] If 7pm- 7am, please page neurology on call as listed in AMION.     Family Communication: Family at bedside understand current care plan.  Disposition: Status is: Inpatient Remains inpatient appropriate because: bacteremia work up, IV antibiotics.  Planned Discharge Destination: Skilled nursing facility    MDM level 3- patient s/p burr hole, had fever, now with bacteremia. He need close hemodynamic, electrolyte, neurologic, telemetry monitoring. He is at high risk for clinical deterioration.  Author: Marcelino Duster, MD 07/23/2023 12:56 PM  For on call review [email or web address].

## 2023-07-23 NOTE — Evaluation (Signed)
Clinical/Bedside Swallow Evaluation Patient Details  Name: Joshua Berry MRN: [id number] Date of Birth: [birth year]  Today's Date: 07/23/2023 Time: SLP Start Time (ACUTE ONLY): 0935 SLP Stop Time (ACUTE ONLY): 1030 SLP Time Calculation (min) (ACUTE ONLY): 55 min  Past Medical History:  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 ENDOSCOPY;  Service: Gastroenterology;  Laterality: N/A;   ESOPHAGOGASTRODUODENOSCOPY N/A 07/20/2019   Procedure: ESOPHAGOGASTRODUODENOSCOPY (EGD);  Surgeon: Toney Reil, MD;  Location: [facility] ENDOSCOPY;  Service: Gastroenterology;  Laterality: N/A;   ESOPHAGOGASTRODUODENOSCOPY Left 08/20/2019   Procedure: ESOPHAGOGASTRODUODENOSCOPY (EGD);  Surgeon: Pasty Spillers, MD;  Location: [facility] ENDOSCOPY;  Service: Endoscopy;  Laterality: Left;   ESOPHAGOGASTRODUODENOSCOPY (EGD) WITH PROPOFOL N/A 04/25/2021   Procedure: ESOPHAGOGASTRODUODENOSCOPY (EGD) WITH PROPOFOL;  Surgeon: Toney Reil, MD;  Location: [facility] ENDOSCOPY;  Service: Gastroenterology;  Laterality: N/A;   MECHANICAL AORTIC VALVE REPLACEMENT     SKIN BIOPSY     TEE WITHOUT CARDIOVERSION N/A 12/23/2018   Procedure: TRANSESOPHAGEAL ECHOCARDIOGRAM (TEE);  Surgeon: Dalia Heading, MD;   Location: [facility] ORS;  Service: Cardiovascular;  Laterality: N/A;   TEE WITHOUT CARDIOVERSION N/A 05/21/2022   Procedure: TRANSESOPHAGEAL ECHOCARDIOGRAM (TEE);  Surgeon: Antonieta Iba, MD;  Location: [facility] ORS;  Service: Cardiovascular;  Laterality: N/A;   TEE WITHOUT CARDIOVERSION N/A 04/09/2023   Procedure: TRANSESOPHAGEAL ECHOCARDIOGRAM;  Surgeon: Debbe Odea, MD;  Location: [facility] ORS;  Service: Cardiovascular;  Laterality: N/A;   HPI:  Pt presented to ED on 07/14/23 with a chief complaint of altered mental status and "terrible" headaches. Workup revealed B SDH, pt now s/p B burr holes on 7/29. PMHx history of diabetes, hypertension, and cirrhosis. Head CT, 7/31, "The amount of intracranial air is stable or slightly  diminished. Both subdural drains have been removed. On the right,  the subdural hematoma is stable, maximal thickness 7 mm. On the  left, the subdural blood and fluid is overall stable, but there is a  slight increase in acute subdural blood at about the level of the  left-sided burr hole. If this does not continue to increase, it is  probably not significant, but certainly this should be watched.  There is left-to-right shift of 1-2 mm. No hydrocephalus. No  intraparenchymal hemorrhage. Subdural blood along the tentorium is  stable." CXR, 7/30, "1. Endotracheal tube, if present, is not identified within the  visualized portions of the neck and chest.  2. Pulmonary vascular congestion without overt edema.".  Pt had a Rapid Response medical decline in status on 07/21/2023 and was transferred to CCU for care. He is now more responseive, A/O.  Imaging since: Head CT: Unchanged appearance of bifrontal pneumocephalus and bilateral  hemispheric, predominantly  isodense subdural hematomas.  CXR: Lower lung volumes with ongoing interstitial opacity favored due to  vascular congestion. Mild interstitial edema is possible but no  pneumonia or pleural effusion is identified.    Assessment / Plan /  Recommendation  Clinical Impression   Pt seen today for repeat BSE secondary to decline in status on 07/21/2023 requiring transfer to CCU. This morning, pt is alert/verbal and able to follow basic commands given instruction/cue. Family present.  Pt cooperative and eager to have something to drink. Min slow to respond at times.   On RA, afebrile. WBC 4.4.  Pt appears to present w/ grossly functional oropharyngeal phase swallowing w/ No pharyngeal phase dysphagia noted, No neuromuscular deficits noted. Oral phase appeared min slower for mastication of increased textured boluses -- similar to previous evaluation. Pt consumed po trials w/ No immediate, overt, clinical s/s of aspiration during po trials.  Pt appears at reduced risk for aspiration following general aspiration precautions using a modified diet consistency.  However, pt does have challenging factors that could impact oropharyngeal swallowing to include Acute/lengthy illness, fatigue/weakness/deconditioning, and need for support w/ feeding at meals. These factors can increase risk for aspiration, dysphagia as well as decreased oral intake overall.   During po trials, pt consumed all consistencies w/ no overt coughing, decline in vocal quality, or change in respiratory presentation during/post trials. O2 sats remained 97%. Oral phase appeared grossly [facility] w/ timely bolus management and control of bolus propulsion for A-P transfer for swallowing. Min prolonged mastication time/effort noted w/ soft solids; suspect impact of increased texture. Oral clearing achieved w/ all trial consistencies -- moistened foods given.  OM Exam appeared [facility] w/ no unilateral weakness noted. Speech Clear; soft voice. Pt helped to feed self by holding Cup to drink which improves safety of swallowing.   Recommend a more MINCED consistency diet w/ well-moistened foods; Thin liquids -- carefully monitor Cup drinking and pt should help to Hold Cup when drinking; NO Straws.  Recommend general aspiration precautions, tray setup and support sitting fully Upright w/ all oral intake. Reduce distractions/talking during meals. Feeding support as needed. Pills WHOLE in Puree for safer, easier swallowing -- pt did well w/ this w/ SLP/NSG this morning so continued to recommend for now and for D/C to the Family.  Education given on Pills in Puree; food consistencies and easy to eat options; general aspiration precautions to pt and Family. ST services will f/u for diet upgrade next 1-2 days as pt's medical status continues to improve. NSG and MD updated, agreed. Family/pt agreed. Recommend Dietician f/u for support. SLP Visit Diagnosis: Dysphagia, oral phase (R13.11) (min)    Aspiration Risk  Mild aspiration risk;Risk for inadequate nutrition/hydration (reduced following general aspiration precautions)    Diet Recommendation   Thin;Dysphagia 2 (chopped) = a more MINCED consistency diet w/ well-moistened foods; Thin liquids -- carefully monitor Cup drinking and pt should help to Hold Cup when drinking; NO Straws. Recommend general aspiration precautions, tray setup and support sitting fully Upright w/ all oral intake. Reduce distractions/talking during meals. Feeding support as needed.   Medication Administration: Whole meds with puree (vs crushed)    Other  Recommendations Recommended Consults:  (Dietician f/u) Oral Care Recommendations: Oral care BID;Oral care before and after PO;Staff/trained caregiver to provide oral care    Recommendations for follow up therapy are one component of a multi-disciplinary discharge planning process, led by the attending physician.  Recommendations may be updated based on patient status, additional functional criteria and  insurance authorization.  Follow up Recommendations Follow physician's recommendations for discharge plan and follow up therapies      Assistance Recommended at Discharge  FULL  Functional Status Assessment Patient has had a  recent decline in their functional status and demonstrates the ability to make significant improvements in function in a reasonable and predictable amount of time.  Frequency and Duration min 2x/week  2 weeks       Prognosis Prognosis for improved oropharyngeal function: Fair (-Good) Barriers to Reach Goals: Cognitive deficits;Time post onset;Severity of deficits Barriers/Prognosis Comment: SDH requiring burr holes, tx      Swallow Study   General Date of Onset: 07/14/23 HPI: Pt presented to ED on 07/14/23 with a chief complaint of altered mental status and "terrible" headaches. Workup revealed B SDH, pt now s/p B burr holes on 7/29. PMHx history of diabetes, hypertension, and cirrhosis. Head CT, 7/31, "The amount of intracranial air is stable or slightly  diminished. Both subdural drains have been removed. On the right,  the subdural hematoma is stable, maximal thickness 7 mm. On the  left, the subdural blood and fluid is overall stable, but there is a  slight increase in acute subdural blood at about the level of the  left-sided burr hole. If this does not continue to increase, it is  probably not significant, but certainly this should be watched.  There is left-to-right shift of 1-2 mm. No hydrocephalus. No  intraparenchymal hemorrhage. Subdural blood along the tentorium is  stable." CXR, 7/30, "1. Endotracheal tube, if present, is not identified within the  visualized portions of the neck and chest.  2. Pulmonary vascular congestion without overt edema.".  Pt had a Rapid Response medical decline in status on 07/21/2023 and was transferred to CCU for care. He is now more responseive, A/O.  Imaging since: Head CT: Unchanged appearance of bifrontal pneumocephalus and bilateral  hemispheric, predominantly isodense subdural hematomas.  CXR: Lower lung volumes with ongoing interstitial opacity favored due to  vascular congestion. Mild interstitial edema is possible but no  pneumonia or pleural effusion is  identified. Type of Study: Bedside Swallow Evaluation Previous Swallow Assessment: 07/17/2023 - dys. 2 w/ thins Diet Prior to this Study: NPO Temperature Spikes Noted: No (wbc 4.4) Respiratory Status: Room air History of Recent Intubation: Yes Total duration of intubation (days): 2 days Date extubated: 07/15/23 Behavior/Cognition: Alert;Cooperative;Pleasant mood;Distractible;Requires cueing Oral Cavity Assessment: Within Functional Limits Oral Care Completed by SLP: Yes Oral Cavity - Dentition: Missing dentition (MOST) Vision: Functional for self-feeding Self-Feeding Abilities: Able to feed self;Needs assist;Needs set up (held cup to drink) Patient Positioning: Upright in bed (needed positioning support) Baseline Vocal Quality: Normal;Low vocal intensity (min) Volitional Cough: Strong    Oral/Motor/Sensory Function Overall Oral Motor/Sensory Function: Within functional limits (no unilateral weakness noted)   Ice Chips Ice chips: Within functional limits Presentation: Spoon (fed; 3 trials)   Thin Liquid Thin Liquid: Within functional limits Presentation: Cup;Self Fed (supported; 12 trials)    Nectar Thick Nectar Thick Liquid: Not tested   Honey Thick Honey Thick Liquid: Not tested   Puree Puree: Within functional limits Presentation: Spoon (fed; 9+ trials)   Solid     Solid: Impaired (min) Presentation: Spoon (fed; 4 trials) Oral Phase Impairments: Impaired mastication (increased time) Oral Phase Functional Implications: Prolonged oral transit;Impaired mastication (min) Pharyngeal Phase Impairments:  (none)        Jerilynn Som, MS, CCC-SLP Speech Language Pathologist Rehab Services; [facility] [place] [phone] (ascom) , 07/23/2023,2:04 PM

## 2023-07-23 NOTE — Plan of Care (Signed)
  Problem: Clinical Measurements: Goal: Ability to maintain clinical measurements within normal limits will improve Outcome: Progressing Goal: Diagnostic test results will improve Outcome: Progressing Goal: Respiratory complications will improve Outcome: Progressing   Problem: Clinical Measurements: Goal: Will remain free from infection Outcome: Not Progressing   Problem: Activity: Goal: Risk for activity intolerance will decrease Outcome: Not Progressing   Problem: Elimination: Goal: Will not experience complications related to bowel motility Outcome: Not Progressing   Problem: Education: Goal: Knowledge of General Education information will improve Description: Including pain rating scale, medication(s)/side effects and non-pharmacologic comfort measures Outcome: Not Applicable

## 2023-07-23 NOTE — Progress Notes (Signed)
OT Cancellation Note  Patient Details Name: MATAEO PERSING MRN: [id number] DOB: 07-10-1946   Cancelled Treatment:    Reason Eval/Treat Not Completed: Medical issues which prohibited therapy. Patient has had a change in medical status with transfer to ICU, medical work up ongoing. Will sign off, please re-consult OT as pt appropriate to participate in therapy.   Kathie Dike, M.S. OTR/L  07/23/23, 10:56 AM  ascom [phone]

## 2023-07-23 NOTE — Progress Notes (Signed)
Nutrition Follow Up Note   DOCUMENTATION CODES:   Not applicable  INTERVENTION:   Ensure Enlive po TID, each supplement provides 350 kcal and 20 grams of protein.  Magic cup TID with meals, each supplement provides 290 kcal and 9 grams of protein  MVI po daily   Vitamin C 500mg  po BID   Pt at high refeed risk; recommend monitor potassium, magnesium and phosphorus labs daily until stable  NUTRITION DIAGNOSIS:   Inadequate oral intake related to acute illness as evidenced by per patient/family report. 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  Pt seen by SLP today and was initiated on a dysphagia 2/thin liquid diet. RD will add supplements and vitamins to help pt meet his estimated needs and to support wound healing. Pt remains at high refeed risk. Per chart, pt is weight stable since admission.    Medications reviewed and include: insulin, lactulose, synthroid, melatonin, MVI, protonix, xifaxan, vancomycin   Labs reviewed: Na 132(L), K 3.9 wnl, P 3.0 wnl, Mg 1.7 wnl Ammonia- 57(H)- 8/5 Hgb 10.4(L), Hct 28.9(L) Cbgs- 149, 107 x 24 hrs  Diet Order:   Diet Order             DIET DYS 2 Room service appropriate? Yes with Assist; Fluid consistency: Thin  Diet effective now                  EDUCATION NEEDS:   No education needs have been identified at this time  Skin:  Skin Assessment: Reviewed RN Assessment (incision head, Stage I buttocks)  Last BM:  8/7- type 1  Height:   Ht Readings from Last 1 Encounters:  07/14/23 6\' 2"  (1.88 m)    Weight:   Wt  Readings from Last 1 Encounters:  07/23/23 102.9 kg    Ideal Body Weight:  86.3 kg  BMI:  Body mass index is 29.13 kg/m.  Estimated Nutritional Needs:   Kcal:  2000-2300kcal/day  Protein:  100-115g/day  Fluid:  2.0-2.3L/day  Betsey Holiday MS, RD, LDN Please refer to [facility] for RD and/or RD on-call/weekend/after hours pager

## 2023-07-23 NOTE — Progress Notes (Signed)
PHARMACY - PHYSICIAN COMMUNICATION CRITICAL VALUE ALERT - BLOOD CULTURE IDENTIFICATION (BCID)  Joshua Berry is an 77 y.o. male who presented to [facility] on 07/14/2023 with a chief complaint of fever following burr holes to drain SDH  Assessment:  8/6 blood cultures with GPC 4/4 bottles, BCID detected MRSE.  Concern for CNS infection following neurosurgery  Name of physician (or Provider) Contacted: Rivka Safer and Sreeram  Current antibiotics: vancomycin, cefepime  Changes to prescribed antibiotics recommended:  ID following - d/c cefepime  Results for orders placed or performed during the hospital encounter of 07/14/23  Blood Culture ID Panel (Reflexed) (Collected: 07/22/2023  8:52 AM)  Result Value Ref Range   Enterococcus faecalis NOT DETECTED NOT DETECTED   Enterococcus Faecium NOT DETECTED NOT DETECTED   Listeria monocytogenes NOT DETECTED NOT DETECTED   Staphylococcus species DETECTED (A) NOT DETECTED   Staphylococcus aureus (BCID) NOT DETECTED NOT DETECTED   Staphylococcus epidermidis DETECTED (A) NOT DETECTED   Staphylococcus lugdunensis NOT DETECTED NOT DETECTED   Streptococcus species NOT DETECTED NOT DETECTED   Streptococcus agalactiae NOT DETECTED NOT DETECTED   Streptococcus pneumoniae NOT DETECTED NOT DETECTED   Streptococcus pyogenes NOT DETECTED NOT DETECTED   A.calcoaceticus-baumannii NOT DETECTED NOT DETECTED   Bacteroides fragilis NOT DETECTED NOT DETECTED   Enterobacterales NOT DETECTED NOT DETECTED   Enterobacter cloacae complex NOT DETECTED NOT DETECTED   Escherichia coli NOT DETECTED NOT DETECTED   Klebsiella aerogenes NOT DETECTED NOT DETECTED   Klebsiella oxytoca NOT DETECTED NOT DETECTED   Klebsiella pneumoniae NOT DETECTED NOT DETECTED   Proteus species NOT DETECTED NOT DETECTED   Salmonella species NOT DETECTED NOT DETECTED   Serratia marcescens NOT DETECTED NOT DETECTED   Haemophilus influenzae NOT DETECTED NOT DETECTED   Neisseria meningitidis  NOT DETECTED NOT DETECTED   Pseudomonas aeruginosa NOT DETECTED NOT DETECTED   Stenotrophomonas maltophilia NOT DETECTED NOT DETECTED   Candida albicans NOT DETECTED NOT DETECTED   Candida auris NOT DETECTED NOT DETECTED   Candida glabrata NOT DETECTED NOT DETECTED   Candida krusei NOT DETECTED NOT DETECTED   Candida parapsilosis NOT DETECTED NOT DETECTED   Candida tropicalis NOT DETECTED NOT DETECTED   Cryptococcus neoformans/gattii NOT DETECTED NOT DETECTED   Methicillin resistance mecA/C DETECTED (A) NOT DETECTED    Juliette Alcide, PharmD, BCPS, BCIDP Work Cell: [phone] 07/23/2023 8:55 AM

## 2023-07-23 NOTE — TOC Progression Note (Addendum)
Transition of Care [facility]) - Progression Note    Patient Details  Name: Joshua Berry MRN: [id number] Date of Birth: 1946/01/24  Transition of [facility]) CM/SW Contact  Kreg Shropshire, RN Phone Number: 07/23/2023, 11:39 AM  Clinical Narrative:     Cm LVM for Opal Sidles admissions with [facility] in [place] [phone] for update on pt STR referral. Awaiting to hear back.   1648- Cm received TOC consult for LTAC. Cm sent email to [place], [place] to see if pt has LTAC benefits [email or web address]. Cm LVM for wife to ask about LTAC options.   Barriers to Discharge: Continued Medical Work up  Expected Discharge Plan and Services       Living arrangements for the past 2 months: Assisted Living Facility                                       Social Determinants of Health (SDOH) Interventions SDOH Screenings   Food Insecurity: No Food Insecurity (07/18/2023)  Housing: Low Risk  (07/18/2023)  Transportation Needs: No Transportation Needs (07/18/2023)  Recent Concern: Transportation Needs - Unmet Transportation Needs (06/17/2023)  Utilities: Not At Risk (07/18/2023)  Recent Concern: Utilities - At Risk (06/17/2023)  Depression (PHQ2-9): Low Risk  (04/24/2023)  Social Connections: Unknown (04/29/2022)   Received [facility]  Tobacco Use: Medium Risk (07/14/2023)    Readmission Risk Interventions    07/15/2023    8:52 AM 06/18/2023   10:19 AM 05/17/2022    1:45 PM  Readmission Risk Prevention Plan  Transportation Screening Complete Complete Complete  PCP or Specialist Appt within 5-7 Days  Complete Complete  PCP or Specialist Appt within 3-5 Days Complete    Home Care Screening  Complete Complete  Medication Review (RN CM)  Complete Complete  Social Work Consult for Recovery Care Planning/Counseling Complete    Palliative Care Screening Not Applicable    Medication Review Oceanographer) Referral to Pharmacy

## 2023-07-24 ENCOUNTER — Ambulatory Visit: Payer: No Typology Code available for payment source

## 2023-07-24 ENCOUNTER — Inpatient Hospital Stay: Payer: No Typology Code available for payment source

## 2023-07-24 DIAGNOSIS — R400 Somnolence: Secondary | ICD-10-CM | POA: Diagnosis not present

## 2023-07-24 DIAGNOSIS — K7460 Unspecified cirrhosis of liver: Secondary | ICD-10-CM | POA: Diagnosis not present

## 2023-07-24 DIAGNOSIS — G935 Compression of brain: Secondary | ICD-10-CM | POA: Diagnosis not present

## 2023-07-24 DIAGNOSIS — R569 Unspecified convulsions: Secondary | ICD-10-CM | POA: Diagnosis not present

## 2023-07-24 DIAGNOSIS — S065XAA Traumatic subdural hemorrhage with loss of consciousness status unknown, initial encounter: Secondary | ICD-10-CM | POA: Diagnosis not present

## 2023-07-24 LAB — BLOOD GAS, ARTERIAL
Acid-Base Excess: 0.9 mmol/L (ref 0.0–2.0)
Bicarbonate: 24 mmol/L (ref 20.0–28.0)
O2 Saturation: 99.7 %
Patient temperature: 37
pCO2 arterial: 33 mmHg (ref 32–48)
pH, Arterial: 7.47 — ABNORMAL HIGH (ref 7.35–7.45)
pO2, Arterial: 109 mmHg — ABNORMAL HIGH (ref 83–108)

## 2023-07-24 LAB — GLUCOSE, CAPILLARY
Glucose-Capillary: 139 mg/dL — ABNORMAL HIGH (ref 70–99)
Glucose-Capillary: 159 mg/dL — ABNORMAL HIGH (ref 70–99)
Glucose-Capillary: 155 mg/dL — ABNORMAL HIGH (ref 70–99)
Glucose-Capillary: 114 mg/dL — ABNORMAL HIGH (ref 70–99)

## 2023-07-24 LAB — HEPATIC FUNCTION PANEL
ALT: 39 U/L (ref 0–44)
AST: 55 U/L — ABNORMAL HIGH (ref 15–41)
Albumin: 2.5 g/dL — ABNORMAL LOW (ref 3.5–5.0)
Alkaline Phosphatase: 71 U/L (ref 38–126)
Bilirubin, Direct: 0.4 mg/dL — ABNORMAL HIGH (ref 0.0–0.2)
Indirect Bilirubin: 1.6 mg/dL — ABNORMAL HIGH (ref 0.3–0.9)
Total Bilirubin: 2 mg/dL — ABNORMAL HIGH (ref 0.3–1.2)
Total Protein: 5.6 g/dL — ABNORMAL LOW (ref 6.5–8.1)

## 2023-07-24 MED ORDER — LACTULOSE ENEMA
300.0000 mL | Freq: Two times a day (BID) | ORAL | Status: DC
  Administered 2023-07-24 – 2023-07-26 (×6): 300 mL via RECTAL
  Filled 2023-07-24 (×10): qty 300

## 2023-07-24 MED ORDER — SODIUM CHLORIDE 1 G PO TABS
1.0000 g | ORAL_TABLET | Freq: Two times a day (BID) | ORAL | Status: DC
  Administered 2023-07-24 – 2023-08-01 (×17): 1 g via ORAL
  Filled 2023-07-24 (×18): qty 1

## 2023-07-24 MED ORDER — SODIUM CHLORIDE 0.9 % IV SOLN: INTRAVENOUS | Status: DC

## 2023-07-24 MED ORDER — LACTULOSE ENEMA: 300.0000 mL | Freq: Two times a day (BID) | ORAL | Status: DC

## 2023-07-24 NOTE — Progress Notes (Signed)
   Neurosurgery Progress Note  History: Joshua Berry is a 77 year old presenting with bilateral subdural hematomas status post bilateral bur hole washout.  POD10: remains drowsy with fluctuating neurologic status  POD 8: Pt experienced AMS overnight prompting transfer to the ICU  POD3: Pt complaining of headache this morning POD2: Patient is not complaining, but having less speech output. POD1: Patient have some improvement in his headaches and neurologic status overnight.  Physical Exam: Vitals:   07/24/23 0700 07/24/23 0802  BP: 117/67 (!) 117/53  Pulse: 66 61  Resp: 14 14  Temp:  98.7 F (37.1 C)  SpO2: 98% 95%    Drowsy and briefly arouses to voice. PERRL.  Does not spontaneously open his eyes Incisions c/d/I   Data: MRI brain 07/23/23  IMPRESSION: 1. Bilateral holohemispheric extra-axial collections, measuring 9 mm on the right and 8 mm on the left. 2. Mass effect on both superior hemispheres. No midline shift. 3. No specific features of infection.     Electronically Signed   By: Deatra Robinson M.D.   On: 07/23/2023 23:55 CT head 07/22/23  IMPRESSION: Unchanged appearance of bifrontal pneumocephalus and bilateral hemispheric, predominantly isodense subdural hematomas.     Electronically Signed   By: Deatra Robinson M.D.   On: 07/21/2023 21:21  Assessment/Plan:  Joshua Berry is a 77 year old presenting with altered mental status and headaches found to have bilateral subdural hematomas status post bilateral bur hole for washout.  He had an episode of AMS yesterday prompting transfer back to the ICU.   - q4 hour neuro checks - Neurology re-engaged for AMS; EEG without signs of seizures. Would agree with consideration of continuous EEG -Would recommend correcting acute hyponatremia. Consider salt tabs if appropriate - Staples removed.  Sutures in place at previous drain sites are dissolvable - please call with any questions or concerns  Manning Charity  PA-C Department of Neurosurgery

## 2023-07-24 NOTE — Progress Notes (Signed)
Progress Note   [name] ZOX:[id number] DOB: [birth year] DOA: 07/14/2023     10 DOS: the patient was seen and examined on 07/24/2023   Brief hospital course: Joshua Berry is a 77 yo M with hx of NASH with liver cirrhosis, portal HTN, TIPS, GIB/GAVE, pancytopenia, OSA, TAVR, left shoulder replacement, multiple episodes of bacteremia requiring prolonged IV abx treatment who presented to [facility] ED from [facility] via EMS for evaluation of altered mental status.    ED workup revealed bilateral subdural hematomas with midline shift and signs of herniation. Family in agreement with surgical intervention. NSGY administered platelets and FFP to improve coagulopathy then brought patient to OR for hematoma evacuation.    Patient was in ICU managed by PCCM, then transferred to hospitalist service on 7/31. 07/21/23 he spiked fever, started on prophylactic meningitis treatment.  07/23/23 MRSE positive blood cultures. ID on board. Continue Vanco, stopped Cefepime. MRI brain rule out abscess. Infected hematoma ordered. 07/24/23 MRI of the brain bilateral holohemispheric extra-axial collections measuring 9 mm, mass effect on both superior hemispheres.  Neurosurgery team suggested MRI findings not concerning.  Advised cEEG, neurology asked for follow-up.  Assessment and Plan: Acute Bilateral Subdural Hematomas  s/p bilateral frontal burr hole washout. Drains removed 8/1. S/p plt and FFP.  No need for further supplementation. Sutures to be removed 8/8, per neurosurgery. Sodium 123, salt tabs ordered. MRI of the brain bilateral holohemispheric extra-axial collections measuring 9 mm, mass effect on both superior hemispheres. No intervention per Neuro surgery team. Discussed with Neurologist, advised spot EEG possibly daily if concern for seizures. We do not have cEEG at this facility.    AMS  Acute metabolic encephalopathy In the setting of subdural hematoma, possible seizure, hepatic  encephalopathy, infection.  On 8/5 Neuro was consulted for persistent somnolence.  EEG neg for seizure, on Keppra with improvement in mental status. AMS, fever 8/5- Started treatment for hepatic encephalopathy and meningitis. Rectal tube inserted due to skin breakdown. Continue lactulose enema QID.  Repeat ammonia 63. Continue rifaximin therapy once he is safe to take oral Continue vancomycin for MRSE blood cultures.   Neurology to reeval for seizures. His repeat MRI 07/23/23 with mass effect on both sides may be the cause of his lethargy. Continue neurochecks. Will place foley for retention.   Fever, AMS MRSE bacteremia Since pt had a subdural drain that had drained spinal fluid, there is concern for noscomial meningitis. Neurosurgery, who deemed LP to carry too much risk. Decision made to start tx for meningitis empirically. Given MRSE bacteremia, ID recommended to stop cefepime, continue vancomycin,    Acute urinary retention Foley reinsertion and care. Once his mental status improves will remove foley with voiding trial.   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 Continue lactulose, rifaximin therapy.  Continue to monitor neurochecks. Nursing supportive care.   OSA Not compliant with CPAP. Will get repeat ABG to check for CO2.   Hypothyroidism Continue home dose Synthroid   Type 2 Diabetes Mellitus Sliding scale insulin as per floor protocol   GERD  Continue PPI therapy.   Dyslipidemia Resume statin at discharge.      Subjective: Patient is seen and examined today morning. He is more sleepy and lethargic. Family at bedside. Sutures removed by neurosurgery team. RN doing bladder scan has retained. His BP has been lower side. Took meds this AM.  Physical Exam:  Vitals:   07/24/23 0802 07/24/23 1000 07/24/23 1100 07/24/23 1200  BP:  (!) 117/53 (!) 92/50 123/61 (!) 96/51  Pulse: 61 (!) 56 (!) 54 63  Resp: 14 14 12 15   Temp: 98.7 F (37.1 C)   98 F (36.7 C)  TempSrc: Oral   Oral  SpO2: 95% 94% 96% 94%  Weight:      Height:       General - Elderly Caucasian weak male, sleepy, opens eyes with pain. HEENT - PERRLA, EOMI, atraumatic head, non tender sinuses. Lung - Clear, diffuse rales, rhonchi. Heart - S1, S2 heard, no murmurs, rubs, trace pedal edema Neuro - lethargic, does not follow commands,unable to do full neuro exam. Skin - Warm and dry. Data Reviewed:     Latest Ref Rng & Units 07/24/2023    4:22 AM 07/23/2023    3:56 AM 07/22/2023    8:51 AM  CBC  WBC 4.0 - 10.5 K/uL 4.4  4.4  4.6   Hemoglobin 13.0 - 17.0 g/dL 9.9  86.5  78.4   Hematocrit 39.0 - 52.0 % 26.7  28.9  29.8   Platelets 150 - 400 K/uL 52  50  50       Latest Ref Rng & Units 07/24/2023    4:22 AM 07/23/2023    3:56 AM 07/22/2023    8:51 AM  BMP  Glucose 70 - 99 mg/dL 696  295  284   BUN 8 - 23 mg/dL 18  15  13    Creatinine 0.61 - 1.24 mg/dL 1.32  4.40  1.02   Sodium 135 - 145 mmol/L 132  132  131   Potassium 3.5 - 5.1 mmol/L 3.8  3.9  3.8   Chloride 98 - 111 mmol/L 103  102  100   CO2 22 - 32 mmol/L 21  22  22    Calcium 8.9 - 10.3 mg/dL 8.3  8.2  8.3    US Venous Img Upper Uni Right(DVT)  Result Date: 07/24/2023 CLINICAL DATA:  Right upper extremity swelling. EXAM: RIGHT UPPER EXTREMITY VENOUS DOPPLER ULTRASOUND TECHNIQUE: Gray-scale sonography with graded compression, as well as color Doppler and duplex ultrasound were performed to evaluate the upper extremity deep venous system from the level of the subclavian vein and including the jugular, axillary, basilic, radial, ulnar and upper cephalic vein. Spectral Doppler was utilized to evaluate flow at rest and with distal augmentation maneuvers. COMPARISON:  None Available. FINDINGS: Contralateral Subclavian Vein: Respiratory phasicity is normal and symmetric with the symptomatic side. No evidence  of thrombus. Normal compressibility. Internal Jugular Vein: No evidence of thrombus. Normal compressibility, respiratory phasicity and response to augmentation. Subclavian Vein: No evidence of thrombus. Normal compressibility, respiratory phasicity and response to augmentation. Axillary Vein: No evidence of thrombus. Normal compressibility, respiratory phasicity and response to augmentation. Cephalic Vein: No evidence of thrombus. Normal compressibility, respiratory phasicity and response to augmentation. Basilic Vein: No evidence of thrombus. Normal compressibility, respiratory phasicity and response to augmentation. Brachial Veins: No evidence of thrombus. Normal compressibility, respiratory phasicity and response to augmentation. Radial Veins: No evidence of thrombus. Normal compressibility, respiratory phasicity and response to augmentation. Ulnar Veins: No evidence of thrombus. Normal compressibility, respiratory phasicity and response to augmentation. Venous Reflux:  None visualized. Other Findings:  None visualized. IMPRESSION: No evidence of DVT within the RIGHT upper extremity. Electronically Signed   By: Aram Candela M.D.   On: 07/24/2023 01:36   MR BRAIN W WO CONTRAST  Result Date: 07/23/2023 CLINICAL DATA:  ALTERED MENTAL STATUS EXAM: MRI HEAD WITHOUT AND WITH CONTRAST TECHNIQUE: Multiplanar, multiecho pulse sequences of the brain and surrounding structures were obtained without and with intravenous contrast. CONTRAST:  10mL GADAVIST GADOBUTROL 1 MMOL/ML IV SOLN COMPARISON:  04/05/2023 brain MRI 07/21/2023 head CT FINDINGS: Brain: There are bilateral holo hemispheric extra-axial collections, measuring 9 mm on the right and 8 mm on the left. There are areas of diffusion restriction within the collection, which may be due to the presence of blood clot or field inhomogeneity caused by pneumocephalus. Moderate anterior pneumocephalus. No acute infarct. Mass effect on both superior hemispheres. No  midline shift. No hydrocephalus. The midline structures are normal. There is multifocal hyperintense T2-weighted signal within the periventricular and deep white matter. No abnormal contrast enhancement. Vascular: Major flow voids are preserved. Skull and upper cervical spine: Bifrontal burr holes. Sinuses/Orbits:No paranasal sinus fluid levels or advanced mucosal thickening. No mastoid or middle ear effusion. Normal orbits. IMPRESSION: 1. Bilateral holohemispheric extra-axial collections, measuring 9 mm on the right and 8 mm on the left. 2. Mass effect on both superior hemispheres. No midline shift. 3. No specific features of infection. Electronically Signed   By: Deatra Robinson M.D.   On: 07/23/2023 23:55     Family Communication: Family at bedside updated regarding current care plan. TOC discussed about LTAC placement.  Disposition: Status is: Inpatient Remains inpatient appropriate because: Very somnolent need close follow up, bacteremia work up, IV antibiotics.  Planned Discharge Destination: LTAC    MDM level 3- patient s/p burr hole, had fever, now with bacteremia, more lethargic. He need IV antibiotic, subspecialty follow up, nursing care, close hemodynamic, electrolyte, neurologic, telemetry monitoring. He is at high risk for clinical deterioration.  Author: Marcelino Duster, MD 07/24/2023 12:33 PM  For on call review [email or web address].

## 2023-07-24 NOTE — TOC Progression Note (Addendum)
Transition of Care [facility]) - Progression Note    Patient Details  Name: Joshua Berry MRN: [id number] Date of Birth: Apr 12, 1946  [facility] CM/SW Contact  Kreg Shropshire, RN Phone Number: 07/24/2023, 11:56 AM  Clinical Narrative:     Cm LVM for [facility] the [place] [phone] ext [phone] to ask about LTAC benefits. Awaiting to hear back  1254- Cm spoke with pt Son Joshua Berry about LTAC option and to offer choice. Pt son agreed for father to go to LTAC [facility]. However he wanted his father to get more stable such as his father only being on abx for a few days. He wants to revisit over the weekend. Cm updated MD and [facility] Liaison.   Barriers to Discharge: Continued Medical Work up  Expected Discharge Plan and Services       Living arrangements for the past 2 months: Assisted Living Facility                                       Social Determinants of Health (SDOH) Interventions SDOH Screenings   Food Insecurity: No Food Insecurity (07/18/2023)  Housing: Low Risk  (07/18/2023)  Transportation Needs: No Transportation Needs (07/18/2023)  Recent Concern: Transportation Needs - Unmet Transportation Needs (06/17/2023)  Utilities: Not At Risk (07/18/2023)  Recent Concern: Utilities - At Risk (06/17/2023)  Depression (PHQ2-9): Low Risk  (04/24/2023)  Social Connections: Unknown (04/29/2022)   Received from [facility]  Tobacco Use: Medium Risk (07/14/2023)    Readmission Risk Interventions    07/15/2023    8:52 AM 06/18/2023   10:19 AM 05/17/2022    1:45 PM  Readmission Risk Prevention Plan  Transportation Screening Complete Complete Complete  PCP or Specialist Appt within 5-7 Days  Complete Complete  PCP or Specialist Appt within 3-5 Days Complete    Home Care Screening  Complete Complete  Medication Review (RN CM)  Complete Complete  Social Work Consult for Recovery Care Planning/Counseling Complete    Palliative Care Screening Not Applicable     Medication Review Oceanographer) Referral to Pharmacy

## 2023-07-24 NOTE — Progress Notes (Addendum)
Staples from head removed by Verita Lamb PA, sutures remain in place. Mri results discussed with family

## 2023-07-24 NOTE — Progress Notes (Signed)
Pharmacy Antibiotic Note  Joshua Berry is a 77 y.o. male admitted on 07/14/2023 with meningitis.  Pharmacy has been consulted for vancomycin and cefepime dosing. Patient s/p elective neurosurgery bilateral burr holes on 7/29 for drainage of chronic SDH.  Bilateral ventriculostomy drains placed in OR and removed on 7/31.  Patient now having fever and altered mental status (PMH non-alcoholic cirrhosis with hepatic encephalopathy)  Today, 07/24/2023 Day #3 vancomycin  Tm/24h afebrile WBC WNL (previously leukopenic) Renal: SCR WNL and at baseline 8/6 Procalcitonin < 0.1 Blood cultures: 4/4 Botltles, BCID = MRSE   (prev blood cx from 7/29 were no growth)  Vancomycin levels: 8/9 vancomycin trough at 03:30 on 1250mg  IV q12h = ___  Plan: Continue Vancomycin 1250mg  IV q12h  Goal vancomycin trough 15-69mcg/mL for CNS infection Estimated trough with this dose is 15.5 mcg/ml using an estimated CrCl of 64ml/min based on age.   Follow renal function and check trough at steady state if remains on vancomycin  Plan to check trough prior to 4am dose 8/9  Height: 6\' 2"  (188 cm) Weight: 101.2 kg (223 lb 1.7 oz) IBW/kg (Calculated) : 82.2  Temp (24hrs), Avg:98.8 F (37.1 C), Min:98.4 F (36.9 C), Max:99.4 F (37.4 C)  Recent Labs  Lab 07/20/23 0411 07/21/23 0403 07/22/23 0851 07/23/23 0356 07/24/23 0422  WBC 2.9* 3.5* 4.6 4.4 4.4  CREATININE 0.67 0.70 0.76 0.72 0.73    Estimated Creatinine Clearance: 96.7 mL/min (by C-G formula based on SCr of 0.73 mg/dL).    Allergies  Allergen Reactions   Nsaids     Other Reaction(s): Ascites   Camphor Rash and Swelling   Nickel Rash   Sulfur Rash and Swelling   Sulfamethoxazole Swelling   Latex Rash   Lisinopril Hives    Other Reaction(s): Cough    Antimicrobials this admission: 8/6 vanco >>  8/6 cefepime >> 8/7  Dose adjustments this admission:   Microbiology results: 8/6 BCx: IP 7/29 BCx: NG-F 7/29 MRSA PCR: neg  Thank you for  allowing pharmacy to be a part of this patient's care.  Juliette Alcide, PharmD, BCPS, BCIDP Work Cell: [phone] 07/24/2023 10:21 AM

## 2023-07-24 NOTE — Progress Notes (Signed)
Dr. Clide Dales aware of patients poor intake. Dietician concerned recommending NG tube for feeding. Per family patient has a history of varices however have been banded but asking if patients specialist steven clayton at [facility] can be made aware and okay the NG tube. Per md will see how patient does by tomorrow before placing NG tube

## 2023-07-24 NOTE — Plan of Care (Signed)
?  Problem: Clinical Measurements: ?Goal: Respiratory complications will improve ?Outcome: Progressing ?Goal: Cardiovascular complication will be avoided ?Outcome: Progressing ?  ?Problem: Safety: ?Goal: Ability to remain free from injury will improve ?Outcome: Progressing ?  ?

## 2023-07-24 NOTE — Progress Notes (Addendum)
SLP Cancellation Note  Patient Details Name: Joshua Berry MRN: [id number] DOB: 03-30-1946   Cancelled treatment:       Reason Eval/Treat Not Completed: Patient at procedure or test/unavailable (EEG)  Per chart notes, pt continues to have periods of poor responsiveness and poor oral intake when lethargic. At times, he awakens and can answer a few basic questions and follow 1 step commands per NSG today.  Per Neurology note, pt's State and presentation could be related to Neuro status: "In addition to mass effect from subdural collections, the bacteremia and hyperammonemia also may be contributing to his altered mentation. MRI brain was completed which shows no evidence of infection intracranially but shows decent sized extra-axial collections bilaterally with mass effect on both superior hemispheres.".  Noted discussion in chart re: possible NGT for nutrition support.   Pt is currently having an EEG. Will f/u w/ toleration of diet tomorrow. NSG monitoring pt at meals/po's following aspiration precautions; Pills in Puree. Will continue to monitor for Cog-linguistic needs while admitted, but suspect Cog-linguistic f/u would be best at next venue of care to address communication issues/needs in ADLs in a structured setting. NSG agreed.      Jerilynn Som, MS, CCC-SLP Speech Language Pathologist Rehab Services; [facility] [phone] (ascom) , 07/24/2023, 2:16 PM

## 2023-07-24 NOTE — Progress Notes (Signed)
1 hour eeg done

## 2023-07-24 NOTE — Progress Notes (Signed)
Dr. Clide Dales made aware of low BP, per MD start on NS at 9ml/hr. Also made aware that bladder scan showed 497 per MD place foley catheter.

## 2023-07-24 NOTE — Progress Notes (Signed)
Neurology Progress Note   S:// Recalled by the hospitalist. Requested advice on need for continuous EEG Patient remains very lethargic-neurosurgery suggests MRI findings are not concerning and advised continuous EEG and neurology follow-up.   O:// Current vital signs: BP (!) 96/51   Pulse 63   Temp 98 F (36.7 C) (Oral)   Resp 15   Ht 6\' 2"  (1.88 m)   Wt 101.2 kg   SpO2 94%   BMI 28.65 kg/m  Vital signs in last 24 hours: Temp:  [98 F (36.7 C)-99.4 F (37.4 C)] 98 F (36.7 C) (08/08 1200) Pulse Rate:  [54-88] 63 (08/08 1200) Resp:  [12-22] 15 (08/08 1200) BP: (84-171)/(40-82) 96/51 (08/08 1200) SpO2:  [94 %-99 %] 94 % (08/08 1200) Weight:  [101.2 kg] 101.2 kg (08/08 0500) General Sleeping in bed, in no distress HEENT: Bandages on the head CVS: Regular rhythm Abdomen nondistended nontender Chest clear to auscultation Neurological exam Drowsy, sleeping in bed. Awakes to voice.  Keeps falling asleep during the exam. Able to tell me that he is in the hospital, able to tell me his name and age correctly. Unable to tell me the correct month. Poor attention concentration Cranial nerves II to XII grossly intact Motor examination with no drift in bilateral upper extremities which she is able to hold antigravity.  He is able to lift both his legs antigravity but does not follow commands enough to keep them up for 5 seconds but there is no focal weakness. Sensation is intact to light touch   Medications  Current Facility-Administered Medications:    0.9 %  sodium chloride infusion, , Intravenous, Continuous, Sreeram, Narendranath, MD, Last Rate: 75 mL/hr at 07/24/23 1231, Infusion Verify at 07/24/23 1231   ascorbic acid (VITAMIN C) tablet 500 mg, 500 mg, Oral, BID, Sreeram, Narendranath, MD, 500 mg at 07/24/23 1043   butalbital-acetaminophen-caffeine (FIORICET) 50-325-40 MG per tablet 2 tablet, 2 tablet, Oral, Q8H PRN, [name], MD, 2 tablet at 07/24/23 0759   Chlorhexidine  Gluconate Cloth 2 % PADS 6 each, 6 each, Topical, Daily, Raechel Chute, MD, 6 each at 07/24/23 1050   clotrimazole (LOTRIMIN) 1 % cream, , Topical, BID, [name], MD, Given at 07/24/23 1050   feeding supplement (ENSURE ENLIVE / ENSURE PLUS) liquid 237 mL, 237 mL, Oral, TID BM, Sreeram, Narendranath, MD, 237 mL at 07/24/23 1049   Gerhardt's butt cream, , Topical, TID, [name], MD, Given at 07/24/23 1043   hydrALAZINE (APRESOLINE) injection 10 mg, 10 mg, Intravenous, Q4H PRN, Paliwal, Aditya, MD, 10 mg at 07/23/23 1353   insulin aspart (novoLOG) injection 0-6 Units, 0-6 Units, Subcutaneous, TID WC, [name], MD, 1 Units at 07/24/23 1210   labetalol (NORMODYNE) injection 10 mg, 10 mg, Intravenous, Q2H PRN, Paliwal, Aditya, MD   lactulose (CHRONULAC) 10 GM/15ML solution 30 g, 30 g, Oral, TID, Sreeram, Narendranath, MD, 30 g at 07/24/23 1044   levETIRAcetam (KEPPRA) IVPB 1000 mg/100 mL premix, 1,000 mg, Intravenous, Q12H, [name], MD, Last Rate: 400 mL/hr at 07/24/23 0609, 1,000 mg at 07/24/23 0981   levothyroxine (SYNTHROID) tablet 100 mcg, 100 mcg, Oral, Q0600, [name], MD, 100 mcg at 07/24/23 0606   melatonin tablet 10 mg, 10 mg, Oral, QHS, [name], MD, 10 mg at 07/23/23 2144   multivitamin with minerals tablet 1 tablet, 1 tablet, Oral, Daily, [name], MD, 1 tablet at 07/24/23 1043   Oral care mouth rinse, 15 mL, Mouth Rinse, 4 times per day, Raechel Chute, MD, 15  mL at 07/24/23 1214   Oral care mouth rinse, 15 mL, Mouth Rinse, PRN, Dgayli, Khabib, MD, 15 mL at 07/15/23 0400   pantoprazole (PROTONIX) EC tablet 40 mg, 40 mg, Oral, BID, [name], MD, 40 mg at 07/24/23 0759   polyethylene glycol (MIRALAX / GLYCOLAX) packet 17 g, 17 g, Oral, Daily PRN, Rust-Chester, Britton L, NP   polyvinyl alcohol (LIQUIFILM TEARS) 1.4 % ophthalmic solution 1 drop, 1 drop, Both Eyes, PRN, [name], MD, 1 drop at 07/19/23 0908   rifaximin (XIFAXAN) tablet 550 mg, 550 mg, Oral, BID, [name], MD, 550 mg  at 07/24/23 1043   sodium chloride tablet 1 g, 1 g, Oral, BID WC, Sreeram, Narendranath, MD, 1 g at 07/24/23 1043   terazosin (HYTRIN) capsule 5 mg, 5 mg, Oral, QHS, [name], MD, 5 mg at 07/23/23 2149   vancomycin (VANCOREADY) IVPB 1250 mg/250 mL, 1,250 mg, Intravenous, Q12H, Aleda Grana, RPH, Last Rate: 166.7 mL/hr at 07/24/23 0420, 1,250 mg at 07/24/23 0420  Labs CBC    Component Value Date/Time   WBC 4.4 07/24/2023 0422   RBC 2.96 (L) 07/24/2023 0422   HGB 9.9 (L) 07/24/2023 0422   HGB 12.3 (L) 05/01/2014 1553   HCT 26.7 (L) 07/24/2023 0422   HCT 36.6 (L) 05/01/2014 1553   PLT 52 (L) 07/24/2023 0422   PLT 72 (L) 05/01/2014 1553   MCV 90.2 07/24/2023 0422   MCV 96 05/01/2014 1553   MCH 33.4 07/24/2023 0422   MCHC 37.1 (H) 07/24/2023 0422   RDW 14.8 07/24/2023 0422   RDW 15.8 (H) 05/01/2014 1553   LYMPHSABS 0.6 (L) 07/15/2023 2139   MONOABS 0.6 07/15/2023 2139   EOSABS 0.0 07/15/2023 2139   BASOSABS 0.0 07/15/2023 2139    CMP     Component Value Date/Time   NA 132 (L) 07/24/2023 0422   NA 136 05/01/2014 1553   K 3.8 07/24/2023 0422   K 4.2 05/01/2014 1553   CL 103 07/24/2023 0422   CL 105 05/01/2014 1553   CO2 21 (L) 07/24/2023 0422   CO2 28 05/01/2014 1553   GLUCOSE 150 (H) 07/24/2023 0422   GLUCOSE 178 (H) 05/01/2014 1553   BUN 18 07/24/2023 0422   BUN 27 (H) 05/01/2014 1553   CREATININE 0.73 07/24/2023 0422   CREATININE 1.49 (H) 05/01/2014 1553   CALCIUM 8.3 (L) 07/24/2023 0422   CALCIUM 9.4 05/01/2014 1553   PROT 5.6 (L) 07/22/2023 0851   PROT 7.9 05/01/2014 1553   ALBUMIN 2.6 (L) 07/22/2023 0851   ALBUMIN 4.2 05/01/2014 1553   AST 51 (H) 07/22/2023 0851   AST 43 (H) 05/01/2014 1553   ALT 43 07/22/2023 0851   ALT 41 05/01/2014 1553   ALKPHOS 78 07/22/2023 0851   ALKPHOS 46 05/01/2014 1553   BILITOT 2.4 (H) 07/22/2023 0851   BILITOT 0.6 05/01/2014 1553   GFRNONAA >60 07/24/2023 0422   GFRNONAA 47 (L) 05/01/2014 1553   GFRAA >60 09/23/2019 1305    GFRAA 55 (L) 05/01/2014 1553    Lipid Panel     Component Value Date/Time   CHOL 145 04/06/2022 1804   TRIG 122 04/06/2022 1804   HDL 43 04/06/2022 1804   CHOLHDL 3.4 04/06/2022 1804   VLDL 24 04/06/2022 1804   LDLCALC 78 04/06/2022 1804    Lab Results  Component Value Date   HGBA1C 5.4 06/17/2023  Ammonia also elevated at 65  Imaging I have reviewed images in epic and the results pertinent to  this consultation are: Most recent one of the brain from yesterday which was performed with and without contrast shows bilateral holohemispheric extra-axial collections measuring up to 9 mm on the right and 8 mm on the left.  There is mass effect on both superior hemispheres with no midline shift.  No specific features of infection were noted.  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 Routine EEG showed no epileptiform abnormality but suspicion remains high for possible nonconvulsive seizures. ID has been consulted-MRSE on blood cultures.  Unable to do spinal tap.  MRI brain was completed which shows no evidence of infection intracranially but shows decent sized extra-axial collections bilaterally with mass effect on both superior hemispheres. For me, he was drowsy and kept falling asleep during the exam but was able to follow all commands. I suspect that his waxing and waning mentation and lower level of consciousness is because of bilateral hemispheric subdural collections causing mass effect. I do not see an urgent need for transfer for continuous EEG but am not averse to  repeating spot EEGs.  In addition to mass effect from subdural collections, the bacteremia and hyperammonemia also may be contributing to his altered mentation.  Suspicion for nonconvulsive status epilepticus is low.  Recommendations: Routine EEG-have recommended that the technologist perform a prolonged EEG.  We do not have the capability for continuous EEG is here and I will consider transfer if the EEG today or repeat EEG tomorrow shows any suspicious findings. Continue Keppra for now Management of hyperammonemia per primary team-I would recommend switching lactulose to rectal until the levels come normal. Plan was discussed with son and wife at bedside.  Plan was relayed to Dr. Sidney Ace  Will follow  -- Milon Dikes, MD Neurologist [facility] Pager: [phone]

## 2023-07-24 NOTE — Procedures (Signed)
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   Level of alertness: Awake  AEDs during EEG study: LEV  Technical aspects: This EEG study was done with scalp electrodes positioned according to the 10-20 International system of electrode placement. Electrical activity was reviewed with band pass filter of 1-70Hz , sensitivity of 7 uV/mm, display speed of 46mm/sec with a 60Hz  notched filter applied as appropriate. EEG data were recorded continuously and digitally stored.  Video monitoring was available and reviewed as appropriate.  Description: EEG showed continuous generalized 3 to 6 Hz theta-delta slowing. Hyperventilation and photic stimulation were not performed.     Of note, EEG was technically difficult due to significant myogenic artifact.   ABNORMALITY - Continuous slow, generalized  IMPRESSION: This technically difficult  study is suggestive of moderate to severe diffuse encephalopathy, nonspecific etiology. No seizures or epileptiform discharges were seen throughout the recording.   Annabelle Harman

## 2023-07-24 NOTE — Progress Notes (Signed)
Date of Admission:  07/14/2023      ID: Joshua Berry is a 77 y.o. male  Principal Problem:   Subdural hematoma (HCC) Active Problems:   Cirrhosis of liver without ascites (HCC)   Compression of brain (HCC)   Uncal herniation (HCC)   Acute nontraumatic intracranial subdural hematoma (HCC)   Staphylococcus epidermidis bacteremia    Subjective: Mental status is fluctuating Mornign he was not alert but now he is more  Medications:   vitamin C  500 mg Oral BID   Chlorhexidine Gluconate Cloth  6 each Topical Daily   clotrimazole   Topical BID   feeding supplement  237 mL Oral TID BM   Gerhardt's butt cream   Topical TID   insulin aspart  0-6 Units Subcutaneous TID WC   lactulose  300 mL Rectal BID   levothyroxine  100 mcg Oral Q0600   melatonin  10 mg Oral QHS   multivitamin with minerals  1 tablet Oral Daily   mouth rinse  15 mL Mouth Rinse 4 times per day   pantoprazole  40 mg Oral BID   rifaximin  550 mg Oral BID   sodium chloride  1 g Oral BID WC   terazosin  5 mg Oral QHS    Objective: Vital signs in last 24 hours: Patient Vitals for the past 24 hrs:  BP Temp Temp src Pulse Resp SpO2 Weight  07/24/23 1400 (!) 117/59 -- -- 66 13 98 % --  07/24/23 1200 (!) 96/51 98 F (36.7 C) Oral 63 15 94 % --  07/24/23 1100 123/61 -- -- (!) 54 12 96 % --  07/24/23 1000 (!) 92/50 -- -- (!) 56 14 94 % --  07/24/23 0802 (!) 117/53 98.7 F (37.1 C) Oral 61 14 95 % --  07/24/23 0700 117/67 -- -- 66 14 98 % --  07/24/23 0600 (!) 114/56 -- -- (!) 58 15 94 % --  07/24/23 0500 (!) 97/54 -- -- 75 14 96 % 101.2 kg  07/24/23 0400 (!) 102/52 -- -- 70 18 97 % --  07/24/23 0300 (!) 105/50 -- -- 69 14 96 % --  07/24/23 0200 (!) 84/40 -- -- (!) 59 16 94 % --  07/24/23 0100 (!) 107/55 -- -- 76 17 95 % --  07/24/23 0000 (!) 117/54 98.6 F (37 C) Oral -- -- -- --  07/23/23 2200 (!) 121/54 -- -- 64 13 98 % --  07/23/23 2149 (!) 112/53 -- -- -- -- -- --  07/23/23 2100 (!) 112/53 -- -- 70 (!)  21 98 % --  07/23/23 2000 (!) 114/50 99.4 F (37.4 C) Oral 70 18 97 % --  07/23/23 1900 127/79 99.2 F (37.3 C) Oral 88 (!) 22 99 % --  07/23/23 1800 (!) 113/52 -- -- 74 16 97 % --  07/23/23 1700 134/61 -- -- 68 16 97 % --  07/23/23 1600 (!) 157/65 98.7 F (37.1 C) Axillary 70 15 97 % --  07/23/23 1500 (!) 134/57 -- -- 70 15 96 % --      PHYSICAL EXAM:  General: Fluctuating level of arousability Head:scalp surgical sites look okay  Eyes: pupils are equal Lungs: Clear to auscultation bilaterally. No Wheezing or Rhonchi. No rales. Heart: s1s2 Abdomen: Soft, non-tender,not distended. Bowel sounds normal. No masses Extremities: atraumatic, no cyanosis. No edema. No clubbing Skin: No rashes or lesions. Or bruising Lymph: Cervical, supraclavicular normal. Neurologic: moves all limbs  Lab Results  Latest Ref Rng & Units 07/24/2023    4:22 AM 07/23/2023    3:56 AM 07/22/2023    8:51 AM  CBC  WBC 4.0 - 10.5 K/uL 4.4  4.4  4.6   Hemoglobin 13.0 - 17.0 g/dL 9.9  29.5  18.8   Hematocrit 39.0 - 52.0 % 26.7  28.9  29.8   Platelets 150 - 400 K/uL 52  50  50        Latest Ref Rng & Units 07/24/2023    4:22 AM 07/23/2023    3:56 AM 07/22/2023    8:51 AM  CMP  Glucose 70 - 99 mg/dL 416  606  301   BUN 8 - 23 mg/dL 18  15  13    Creatinine 0.61 - 1.24 mg/dL 6.01  0.93  2.35   Sodium 135 - 145 mmol/L 132  132  131   Potassium 3.5 - 5.1 mmol/L 3.8  3.9  3.8   Chloride 98 - 111 mmol/L 103  102  100   CO2 22 - 32 mmol/L 21  22  22    Calcium 8.9 - 10.3 mg/dL 8.3  8.2  8.3   Total Protein 6.5 - 8.1 g/dL   5.6   Total Bilirubin 0.3 - 1.2 mg/dL   2.4   Alkaline Phos 38 - 126 U/L   78   AST 15 - 41 U/L   51   ALT 0 - 44 U/L   43       Microbiology: Bc- staph epidermidis X 4 Studies/Results: US Venous Img Upper Uni Right(DVT)  Result Date: 07/24/2023 CLINICAL DATA:  Right upper extremity swelling. EXAM: RIGHT UPPER EXTREMITY VENOUS DOPPLER ULTRASOUND TECHNIQUE: Gray-scale sonography with  graded compression, as well as color Doppler and duplex ultrasound were performed to evaluate the upper extremity deep venous system from the level of the subclavian vein and including the jugular, axillary, basilic, radial, ulnar and upper cephalic vein. Spectral Doppler was utilized to evaluate flow at rest and with distal augmentation maneuvers. COMPARISON:  None Available. FINDINGS: Contralateral Subclavian Vein: Respiratory phasicity is normal and symmetric with the symptomatic side. No evidence of thrombus. Normal compressibility. Internal Jugular Vein: No evidence of thrombus. Normal compressibility, respiratory phasicity and response to augmentation. Subclavian Vein: No evidence of thrombus. Normal compressibility, respiratory phasicity and response to augmentation. Axillary Vein: No evidence of thrombus. Normal compressibility, respiratory phasicity and response to augmentation. Cephalic Vein: No evidence of thrombus. Normal compressibility, respiratory phasicity and response to augmentation. Basilic Vein: No evidence of thrombus. Normal compressibility, respiratory phasicity and response to augmentation. Brachial Veins: No evidence of thrombus. Normal compressibility, respiratory phasicity and response to augmentation. Radial Veins: No evidence of thrombus. Normal compressibility, respiratory phasicity and response to augmentation. Ulnar Veins: No evidence of thrombus. Normal compressibility, respiratory phasicity and response to augmentation. Venous Reflux:  None visualized. Other Findings:  None visualized. IMPRESSION: No evidence of DVT within the RIGHT upper extremity. Electronically Signed   By: Aram Candela M.D.   On: 07/24/2023 01:36   MR BRAIN W WO CONTRAST  Result Date: 07/23/2023 CLINICAL DATA:  ALTERED MENTAL STATUS EXAM: MRI HEAD WITHOUT AND WITH CONTRAST TECHNIQUE: Multiplanar, multiecho pulse sequences of the brain and surrounding structures were obtained without and with intravenous  contrast. CONTRAST:  10mL GADAVIST GADOBUTROL 1 MMOL/ML IV SOLN COMPARISON:  04/05/2023 brain MRI 07/21/2023 head CT FINDINGS: Brain: There are bilateral holo hemispheric extra-axial collections, measuring 9 mm on the right and 8 mm on the left. There  are areas of diffusion restriction within the collection, which may be due to the presence of blood clot or field inhomogeneity caused by pneumocephalus. Moderate anterior pneumocephalus. No acute infarct. Mass effect on both superior hemispheres. No midline shift. No hydrocephalus. The midline structures are normal. There is multifocal hyperintense T2-weighted signal within the periventricular and deep white matter. No abnormal contrast enhancement. Vascular: Major flow voids are preserved. Skull and upper cervical spine: Bifrontal burr holes. Sinuses/Orbits:No paranasal sinus fluid levels or advanced mucosal thickening. No mastoid or middle ear effusion. Normal orbits. IMPRESSION: 1. Bilateral holohemispheric extra-axial collections, measuring 9 mm on the right and 8 mm on the left. 2. Mass effect on both superior hemispheres. No midline shift. 3. No specific features of infection. Electronically Signed   By: Deatra Robinson M.D.   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

## 2023-07-25 ENCOUNTER — Ambulatory Visit: Payer: No Typology Code available for payment source

## 2023-07-25 DIAGNOSIS — Z515 Encounter for palliative care: Secondary | ICD-10-CM

## 2023-07-25 DIAGNOSIS — R569 Unspecified convulsions: Secondary | ICD-10-CM | POA: Diagnosis not present

## 2023-07-25 DIAGNOSIS — K7460 Unspecified cirrhosis of liver: Secondary | ICD-10-CM | POA: Diagnosis not present

## 2023-07-25 DIAGNOSIS — R400 Somnolence: Secondary | ICD-10-CM | POA: Diagnosis not present

## 2023-07-25 DIAGNOSIS — R7881 Bacteremia: Secondary | ICD-10-CM | POA: Diagnosis not present

## 2023-07-25 DIAGNOSIS — G935 Compression of brain: Secondary | ICD-10-CM | POA: Diagnosis not present

## 2023-07-25 DIAGNOSIS — S065XAA Traumatic subdural hemorrhage with loss of consciousness status unknown, initial encounter: Secondary | ICD-10-CM | POA: Diagnosis not present

## 2023-07-25 LAB — GLUCOSE, CAPILLARY
Glucose-Capillary: 124 mg/dL — ABNORMAL HIGH (ref 70–99)
Glucose-Capillary: 188 mg/dL — ABNORMAL HIGH (ref 70–99)
Glucose-Capillary: 123 mg/dL — ABNORMAL HIGH (ref 70–99)
Glucose-Capillary: 161 mg/dL — ABNORMAL HIGH (ref 70–99)

## 2023-07-25 MED ORDER — MAGNESIUM SULFATE 2 GM/50ML IV SOLN
2.0000 g | Freq: Once | INTRAVENOUS | Status: AC
  Administered 2023-07-25: 2 g via INTRAVENOUS
  Filled 2023-07-25: qty 50

## 2023-07-25 NOTE — Plan of Care (Signed)

## 2023-07-25 NOTE — Consult Note (Signed)
PHARMACY CONSULT NOTE - ELECTROLYTES  Pharmacy Consult for Electrolyte Monitoring and Replacement   Recent Labs: Potassium (mmol/L)  Date Value  07/25/2023 3.6  05/01/2014 4.2   Magnesium (mg/dL)  Date Value  29/56/2130 1.6 (L)   Calcium (mg/dL)  Date Value  86/57/8469 8.0 (L)   Calcium, Total (mg/dL)  Date Value  62/95/2841 9.4   Albumin (g/dL)  Date Value  32/44/0102 2.5 (L)  05/01/2014 4.2   Phosphorus (mg/dL)  Date Value  72/53/6644 2.7   Sodium (mmol/L)  Date Value  07/25/2023 133 (L)  05/01/2014 136   Height: 6\' 2"  (188 cm) Weight: 101.9 kg (224 lb 10.4 oz) IBW/kg (Calculated) : 82.2 Estimated Creatinine Clearance: 97 mL/min (by C-G formula based on SCr of 0.7 mg/dL).  Assessment  Joshua Berry is a 77 y.o. male presenting with acute bilateral subdural hematomas s/p bilateral frontal burr hole washout. Hospital course complicated by MRSE bacteremia and altered mental status. PMH significant for NASH with liver cirrhosis, portal HTN, TIPS, GIB/GAVE, pancytopenia, OSA, TAVR, multiple episodes of bacteremia requiring prolonged IV antibiotic therapy. Pharmacy has been consulted to monitor and replace electrolytes.  Diet: Dysphagia 2 MIVF: NS @ 75 mL/hr Pertinent medications: NaCl 1 g BIDM  Goal of Therapy: Electrolytes within normal limits  Plan:  Na 133, continue NaCl 1 g BIDM and fluids as above Mg 1.6, will give magnesium sulfate 2 g IV x 1 Follow-up electrolytes with AM labs tomorrow  Thank you for allowing pharmacy to be a part of this patient's care.  Tressie Ellis 07/25/2023 10:58 AM

## 2023-07-25 NOTE — Progress Notes (Signed)
Eeg done 

## 2023-07-25 NOTE — Progress Notes (Signed)
Progress Note   [name] NWG:[id number] DOB: May 21, 1946 DOA: 07/14/2023     11 DOS: the patient was seen and examined on 07/25/2023   Brief hospital course: Joshua Berry is a 77 yo M with hx of NASH with liver cirrhosis, portal HTN, TIPS, GIB/GAVE, pancytopenia, OSA, TAVR, left shoulder replacement, multiple episodes of bacteremia requiring prolonged IV abx treatment who presented to [facility] ED from [place] assisted living via EMS for evaluation of altered mental status.    ED workup revealed bilateral subdural hematomas with midline shift and signs of herniation. Family in agreement with surgical intervention. NSGY administered platelets and FFP to improve coagulopathy then brought patient to OR for hematoma evacuation.    Patient was in ICU managed by PCCM, then transferred to hospitalist service on 7/31. 07/21/23 he spiked fever, started on prophylactic meningitis treatment.  07/23/23 MRSE positive blood cultures. ID on board. Continue Vanco, stopped Cefepime. MRI brain rule out abscess. Infected hematoma ordered. 07/24/23 MRI of the brain bilateral holohemispheric extra-axial collections measuring 9 mm, mass effect on both superior hemispheres.  Neurosurgery team suggested MRI findings not concerning.  Advised cEEG, neurology asked for follow-up.  Assessment and Plan: Acute Bilateral Subdural Hematomas  s/p bilateral frontal burr hole washout. Drains removed 8/1. S/p plt and FFP.  No need for further supplementation. Staples removed on 8/8 by neurosurgery..  Sutures in place and the previous drain sites are dissolvable per neurosurgery Sodium 123 -> 133, continue salt tabs  MRI of the brain bilateral holohemispheric extra-axial collections measuring 9 mm, mass effect on both superior hemispheres. No intervention per Neuro surgery team. Discussed with Neurologist, EEG not showing any active seizure.  Repeat EEG today also showed no seizure. I discussed with patient and family for  possible NG/Dobbhoff tube per nutritionist request that they all refused and they would like to keep him on oral nutrition.  They are very concerned about his agitation with NG tube and possible damage to esophageal varices and bleed  Acute metabolic encephalopathy In the setting of subdural hematoma, possible seizure, hepatic encephalopathy, infection.  On 8/5 Neuro was consulted for persistent somnolence.  EEG neg for seizure, on Keppra with improvement in mental status.  Patient has waxing and waning mental status AMS, fever 8/5- Started treatment for hepatic encephalopathy and meningitis. Rectal tube inserted due to skin breakdown. Continue lactulose enema QID.  Repeat ammonia 63. Continue rifaximin therapy once he is safe to take oral Continue vancomycin for MRSE blood cultures.   Neurology following.  EEG negative for seizure. His repeat MRI 07/23/23 with mass effect on both sides may be the cause of his lethargy. Continue neurochecks.  Indwelling foley for urinary retention.   Fever, AMS MRSE bacteremia Since pt had a subdural drain that had drained spinal fluid, there is concern for noscomial meningitis. Neurosurgery, who deemed LP to carry too much risk. Decision made to start tx for meningitis empirically. Given MRSE bacteremia, ID recommended to stop cefepime, continue vancomycin,    Acute urinary retention Foley reinsertion and care during the inpatient stay. Once his mental status improves will remove foley with voiding trial.   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 Continue lactulose, rifaximin therapy.  Continue to monitor neurochecks. Nursing supportive care.   OSA Not compliant with CPAP.   Hypothyroidism Continue home dose Synthroid   Type 2 Diabetes Mellitus Sliding scale insulin as per floor protocol  GERD  Continue PPI  therapy.   Dyslipidemia Resume statin at discharge.      Subjective: Patient is awake and alert, was aware that he is in the hospital.  Has no new complaints.  Physical Exam: Vitals:   07/25/23 1000 07/25/23 1100 07/25/23 1121 07/25/23 1619  BP: (!) 116/51 120/64  (!) 111/59  Pulse: 69 69 72 65  Resp: 12 13 15 20   Temp:   98.3 F (36.8 C) 98.5 F (36.9 C)  TempSrc:   Oral   SpO2: 98% 96% 98% 97%  Weight:      Height:       General - Elderly Caucasian weak male, lying in the bed comfortably without any acute distress HEENT - PERRLA, EOMI, atraumatic head, non tender sinuses.  Has bandages on his head Lung - Clear, diffuse rales, rhonchi. Heart - S1, S2 heard, no murmurs, rubs, trace pedal edema Neuro -awake and alert, nonfocal Skin - Warm and dry. Data Reviewed:     Latest Ref Rng & Units 07/25/2023    3:19 AM 07/24/2023    4:22 AM 07/23/2023    3:56 AM  CBC  WBC 4.0 - 10.5 K/uL 3.6  4.4  4.4   Hemoglobin 13.0 - 17.0 g/dL 9.5  9.9  13.0   Hematocrit 39.0 - 52.0 % 26.2  26.7  28.9   Platelets 150 - 400 K/uL 48  52  50       Latest Ref Rng & Units 07/25/2023    3:19 AM 07/24/2023    4:22 AM 07/23/2023    3:56 AM  BMP  Glucose 70 - 99 mg/dL 865  784  696   BUN 8 - 23 mg/dL 14  18  15    Creatinine 0.61 - 1.24 mg/dL 2.95  2.84  1.32   Sodium 135 - 145 mmol/L 133  132  132   Potassium 3.5 - 5.1 mmol/L 3.6  3.8  3.9   Chloride 98 - 111 mmol/L 105  103  102   CO2 22 - 32 mmol/L 23  21  22    Calcium 8.9 - 10.3 mg/dL 8.0  8.3  8.2    EEG adult  Result Date: 07/25/2023 Charlsie Quest, MD     07/25/2023  4:52 PM Patient Name: Joshua Berry MRN: [id number] Epilepsy Attending: Charlsie Quest Referring Physician/Provider: Milon Dikes, MD Date: 07/25/2023 Duration: 25.48 mins Patient history: 77yo M presented with altered mental status and was found to have 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 EEG to evaluate for seizure Level of alertness: Awake  AEDs during EEG study: LEV  Technical aspects: This EEG study was done with scalp electrodes positioned according to the 10-20 International system of electrode placement. Electrical activity was reviewed with band pass filter of 1-70Hz , sensitivity of 7 uV/mm, display speed of 5mm/sec with a 60Hz  notched filter applied as appropriate. EEG data were recorded continuously and digitally stored.  Video monitoring was available and reviewed as appropriate.  Description: EEG showed continuous generalized 3 to 6 Hz theta-delta slowing. Hyperventilation and photic stimulation were not performed.    Of note, EEG was technically difficult due to significant myogenic artifact.  ABNORMALITY - Continuous slow, generalized  IMPRESSION: This technically difficult  study is suggestive of moderate to severe diffuse encephalopathy,  nonspecific etiology. No seizures or epileptiform discharges were seen throughout the recording.  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 Level of alertness: Awake AEDs during EEG study: LEV Technical aspects: This EEG study was done with scalp electrodes positioned according to the 10-20 International system of electrode placement. Electrical activity was reviewed with band pass  filter of 1-70Hz , sensitivity of 7 uV/mm, display speed of 74mm/sec with a 60Hz  notched filter applied as appropriate. EEG data were recorded continuously and digitally stored.  Video monitoring was available and reviewed as appropriate. Description: EEG showed continuous generalized 3 to 6 Hz theta-delta slowing. Hyperventilation and photic stimulation were not performed.   Of note, EEG was technically difficult due to significant myogenic artifact. ABNORMALITY - Continuous slow, generalized IMPRESSION: This technically difficult  study is suggestive of moderate to severe diffuse encephalopathy, nonspecific etiology. No seizures or epileptiform discharges were seen throughout the recording. Priyanka Annabelle Harman   US Venous Img Upper Uni Right(DVT)  Result Date: 07/24/2023 CLINICAL DATA:  Right upper extremity swelling. EXAM: RIGHT UPPER EXTREMITY VENOUS DOPPLER ULTRASOUND TECHNIQUE: Gray-scale sonography with graded compression, as well as color Doppler and duplex ultrasound were performed to evaluate the upper extremity deep venous system from the level of the subclavian vein and including the jugular, axillary, basilic, radial, ulnar and upper cephalic vein. Spectral Doppler was utilized to evaluate flow at rest and with distal augmentation maneuvers. COMPARISON:  None Available. FINDINGS: Contralateral Subclavian Vein: Respiratory phasicity is normal and symmetric with the symptomatic side. No evidence of thrombus. Normal compressibility. Internal Jugular Vein: No evidence of thrombus. Normal compressibility, respiratory phasicity and response to augmentation. Subclavian Vein: No evidence of thrombus. Normal compressibility, respiratory phasicity and response to augmentation. Axillary Vein: No evidence of thrombus. Normal compressibility, respiratory phasicity and response to augmentation. Cephalic Vein: No evidence of thrombus. Normal compressibility, respiratory phasicity and response to augmentation. Basilic  Vein: No evidence of thrombus. Normal compressibility, respiratory phasicity and response to augmentation. Brachial Veins: No evidence of thrombus. Normal compressibility, respiratory phasicity and response to augmentation. Radial Veins: No evidence of thrombus. Normal compressibility, respiratory phasicity and response to augmentation. Ulnar Veins: No evidence of thrombus. Normal compressibility, respiratory phasicity and response to augmentation. Venous Reflux:  None visualized. Other Findings:  None visualized. IMPRESSION: No evidence of DVT within the RIGHT upper extremity. Electronically Signed   By: Aram Candela M.D.   On: 07/24/2023 01:36   MR BRAIN W WO CONTRAST  Result Date: 07/23/2023 CLINICAL DATA:  ALTERED MENTAL STATUS EXAM: MRI HEAD WITHOUT AND WITH CONTRAST TECHNIQUE: Multiplanar, multiecho pulse sequences of the brain and surrounding structures were obtained without and with intravenous contrast. CONTRAST:  10mL GADAVIST GADOBUTROL 1 MMOL/ML IV SOLN COMPARISON:  04/05/2023 brain MRI 07/21/2023 head CT FINDINGS: Brain: There are bilateral holo hemispheric extra-axial collections, measuring 9 mm on the right and 8 mm on the left. There are areas of diffusion restriction within the collection, which may be  due to the presence of blood clot or field inhomogeneity caused by pneumocephalus. Moderate anterior pneumocephalus. No acute infarct. Mass effect on both superior hemispheres. No midline shift. No hydrocephalus. The midline structures are normal. There is multifocal hyperintense T2-weighted signal within the periventricular and deep white matter. No abnormal contrast enhancement. Vascular: Major flow voids are preserved. Skull and upper cervical spine: Bifrontal burr holes. Sinuses/Orbits:No paranasal sinus fluid levels or advanced mucosal thickening. No mastoid or middle ear effusion. Normal orbits. IMPRESSION: 1. Bilateral holohemispheric extra-axial collections, measuring 9 mm on the right  and 8 mm on the left. 2. Mass effect on both superior hemispheres. No midline shift. 3. No specific features of infection. Electronically Signed   By: Deatra Robinson M.D.   On: 07/23/2023 23:55     Family Communication: Family/son updated over phone regarding current care plan.  Neurology also has updated family.  They are aware of him moving to PCU and in agreement  Disposition: Status is: Inpatient: bacteremia work up, IV antibiotics.  Planned Discharge Destination: LTAC   Time spent 35 minutes MDM level 3- patient s/p burr hole, had fever, now with bacteremia, waxing and waning mental status. He need IV antibiotic, subspecialty follow up, nursing care, close hemodynamic, electrolyte, neurologic, telemetry monitoring. He is at high risk for clinical deterioration.  Author: Delfino Lovett, MD 07/25/2023 5:40 PM  For on call review [email or web address].

## 2023-07-25 NOTE — TOC Progression Note (Signed)
Transition of Care [facility]) - Progression Note    Patient Details  Name: Joshua Berry MRN: [id number] Date of Birth: 1946/04/10  Transition of Care [facility]) CM/SW Contact  Kreg Shropshire, RN Phone Number: 07/25/2023, 9:44 AM  Clinical Narrative:     Cm spoke with [name] from [place]. She stated that pt has VA benefits from [facility] and [facility] completes the admission process    Barriers to Discharge: Continued Medical Work up  Expected Discharge Plan and Services       Living arrangements for the past 2 months: Assisted Living Facility                                       Social Determinants of Health (SDOH) Interventions SDOH Screenings   Food Insecurity: No Food Insecurity (07/18/2023)  Housing: Low Risk  (07/18/2023)  Transportation Needs: No Transportation Needs (07/18/2023)  Recent Concern: Transportation Needs - Unmet Transportation Needs (06/17/2023)  Utilities: Not At Risk (07/18/2023)  Recent Concern: Utilities - At Risk (06/17/2023)  Depression (PHQ2-9): Low Risk  (04/24/2023)  Social Connections: Unknown (04/29/2022)   Received from [facility], [facility]  Tobacco Use: Medium Risk (07/14/2023)    Readmission Risk Interventions    07/15/2023    8:52 AM 06/18/2023   10:19 AM 05/17/2022    1:45 PM  Readmission Risk Prevention Plan  Transportation Screening Complete Complete Complete  PCP or Specialist Appt within 5-7 Days  Complete Complete  PCP or Specialist Appt within 3-5 Days Complete    Home Care Screening  Complete Complete  Medication Review (RN CM)  Complete Complete  Social Work Consult for Recovery Care Planning/Counseling Complete    Palliative Care Screening Not Applicable    Medication Review Oceanographer) Referral to Pharmacy

## 2023-07-25 NOTE — TOC Progression Note (Addendum)
Transition of Care [facility]) - Progression Note    Patient Details  Name: Joshua Berry MRN: [id number] Date of Birth: [birth year]  [facility]) CM/SW Contact  Kreg Shropshire, RN Phone Number: 07/25/2023, 8:11 AM  Clinical Narrative:     Cm received message from [place] from the [place] about LTAC benefits. Cm informed Jasmin that Initially pt needed STR with SNF. Now consult is for LTAC. Jasmin stated to give [facility] Hearing [phone] a call regarding LTAC benefits. CM LVM for [facility] Hearing regarding questions about LTAC. Awaiting to hear back. Claudean Severance Liaison from [facility] [phone] is also following for LTAC needs   Readmission Risk Interventions    07/15/2023    8:52 AM 06/18/2023   10:19 AM 05/17/2022    1:45 PM  Readmission Risk Prevention Plan  Transportation Screening Complete Complete Complete  PCP or Specialist Appt within 5-7 Days  Complete Complete  PCP or Specialist Appt within 3-5 Days Complete    Home Care Screening  Complete Complete  Medication Review (RN CM)  Complete Complete  Social Work Consult for Recovery Care Planning/Counseling Complete    Palliative Care Screening Not Applicable    Medication Review Oceanographer) Referral to Pharmacy

## 2023-07-25 NOTE — Consult Note (Signed)
Consultation Note Date: 07/25/2023 at 1400  Patient Name: Joshua Berry  DOB: 1946/05/05  MRN: [id number]  Age / Sex: 77 y.o., male  [facility] Referring Physician: Delfino Lovett, MD  Reason for Consultation: Establishing goals of care  HPI/Patient Profile: 77 y.o. male  with past medical history of NASH with liver cirrhosis, portal HTN, TIPS, GIB/GAVE, pancytopenia, OSA, TAVR, left shoulder replacement, multiple episodes of bacteremia requiring prolonged IV abx treatment  admitted on 07/14/2023 with AMS from [facility] nursing facility.   Patient found to have bilateral subdural hematomas with midline shift and signs of herniation.  Platelets and FFP given to improve coagulopathy before patient underwent bilateral frontal burr hole washout.  07/21/23 he spiked fever, started on prophylactic meningitis treatment.  07/23/23 MRSE positive blood cultures. ID on board. Continue Vanco, stopped Cefepime. MRI brain rule out abscess. Infected hematoma ordered. 07/24/23 MRI of the brain bilateral holohemispheric extra-axial collections measuring 9 mm, mass effect on both superior hemispheres.  Neurosurgery team suggested MRI findings not concerning.  Advised cEEG, neurology asked for follow-up.  PMT was consulted to discuss goals and boundaries of care.  Clinical Assessment and Goals of Care: I have reviewed medical records including EPIC notes, labs and imaging, assessed the patient and then met with patient and his wife Joshua Berry at bedside to discuss diagnosis prognosis, GOC, EOL wishes, disposition and options.  I introduced Palliative Medicine as specialized medical care for people living with serious illness. It focuses on providing relief from the symptoms and stress of a serious illness. The goal is to improve quality of life for both the patient and the family.  We discussed a brief life review of the patient.   Joshua Berry and Joshua Berry have been married for 12 years.  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  They are both LTC residence at [employer].  Patient has 1 biological daughter and 2 stepchildren (Joshua Berry's son and daughter).  Joshua Berry describes Joshua Berry as "the most [place] man I know".  As far as functional and nutritional status she shares he was independent with all ADLs PTA.  She shares she had no challenges with p.o. intake or nutrition PTA.  She endorses he had a headache for 4 days and she did not recognize that it was a sign of a brain bleed.  We discussed patient's current illness and what it means in the larger context of patient's on-going co-morbidities.  Education provided on recovery after bur hole procedure and recovery time.  Neurology's recommendations also discussed.  I attempted to elicit values and goals of care important to the patient.  She shares she wants him to go to rehab if needed and then return to [place] with her.  I assured her TOC is following closely for disposition planning.  Joshua Berry says that she and her family are taking it 1 day at a time.  They are hopeful for improvements every day.   Joshua Berry shares that she and was had many conversations prior to this event in regards to  boundaries of care.  Joshua Berry endorses that Joshua Berry would want to remain a DNR with limited interventions, would never be accepting of a ventilator or feeding tube, and would never want artificial means or life support to sustain his life. Patient's son has spoken with Dr. Sherryll Burger and all remain in agreement for no feeding tube/NG/dbhoff to be placed at this time. Furthermore, Joshua Berry shares she would never want a feeding tube placed in patient at any time.  Joshua Berry is concerned that patient will transfer out of ICU and he will not get the appropriate nursing care he needs.  She had a previously negative experience with unit 1C and is requesting that patient not be transferred there.  I assured her medical team is optimizing  his care and that her request to avoid 1C will be made known to the medical team.    Advance directives, concepts specific to code status, artificial feeding and hydration, and rehospitalization were considered and discussed. MOST form introduced.  Joshua Berry shares she is clear on what she knows the patient would want for himself.  She would want a MOST form completed but would like for her son to review it as well.  Joshua Berry shares that her children may have a difference of opinion as far as feeding tubes.  However, she is adamant that the patient would never be accepting of a temporary or permanent feeding tube, given his high risk for bleeding.  Joshua Berry shared that she would want the following but he completed on his MOST form but is not prepared to finalize it at this time:   Cardiopulmonary Resuscitation: Do Not Attempt Resuscitation (DNR/No CPR)  Medical Interventions: Limited Additional Interventions: Use medical treatment, IV fluids and cardiac monitoring as indicated, DO NOT USE intubation or mechanical ventilation. May consider use of less invasive airway support such as BiPAP or CPAP. Also provide comfort measures. Transfer to the hospital if indicated. Avoid intensive care.   Antibiotics: Antibiotics if indicated  IV Fluids: IV fluids for a defined trial period  Feeding Tube: No feeding tube     Discussed with patient and Joshua Berry the importance of continued conversation with family and the medical providers regarding overall plan of care and treatment options, ensuring decisions are within the context of the patient's values and GOCs.    Questions and concerns were addressed. The family was encouraged to call with questions or concerns.   Goals are clear. MOST pending. PMT will continue to follow.   Primary Decision Maker NEXT OF KIN  Physical Exam Vitals reviewed.  Constitutional:      General: He is not in acute distress.    Appearance: He is normal weight.  HENT:     Head:  Normocephalic.     Nose: Nose normal.     Mouth/Throat:     Mouth: Mucous membranes are moist.  Pulmonary:     Effort: Pulmonary effort is normal.  Abdominal:     Palpations: Abdomen is soft.  Musculoskeletal:     Comments: Generalized weakness  Skin:    General: Skin is warm and dry.  Neurological:     Mental Status: He is alert.     Comments: Oriented to self and place  Psychiatric:        Mood and Affect: Mood normal.        Behavior: Behavior normal.        Thought Content: Thought content normal.     Palliative Assessment/Data: 50%     Thank you for this  consult. Palliative medicine will continue to follow and assist holistically.   Time Total: 75 minutes  Signed by: Georgiann Cocker, DNP, FNP-BC Palliative Medicine    Please contact Palliative Medicine Team phone at [phone] for questions and concerns.  For individual provider: See Loretha Stapler

## 2023-07-25 NOTE — Progress Notes (Signed)
SLP F/U Note  Patient Details Name: WILTON BONACCORSO MRN: [id number] DOB: December 30, 1945   Cancelled treatment:       Reason Eval/Treat Not Completed:  (chart reviewed; consulted NSG and met w/ pt/Wife in room; pt was sleeping.)  Per Wife and NSG, pt has been having trouble eating some of the Minced foods in the Dysphagia level 2 diet(meatloaf, chicken). He is tolerating the Purees in the diet "very well" per Wife/NSG. Wife requested diet downgraded to puree -- agreed w/ this during his stay in the hospital d/t his waxing/waning mental status and alertness. Noted EEG results: "study is suggestive of moderate to severe diffuse encephalopathy, nonspecific etiology".   Diet modified to a Dysphagia level 1 w/ thin liquids; aspiration precautions. Recommend f/u at his next venue of care for assessment to upgrade diet IF appropriate; also f/u then for any cognitive-linguistic needs. Wife and NSG agreed.      Jerilynn Som, MS, CCC-SLP Speech Language Pathologist Rehab Services; [facility] [phone] (ascom) , 07/25/2023, 3:38 PM

## 2023-07-25 NOTE — Progress Notes (Signed)
Neurology Progress Note   S:// Seen and examined. Sitting in bed eating breakfast. More awake than last few days   O:// Current vital signs: BP 115/68   Pulse 74   Temp 98.3 F (36.8 C) (Oral)   Resp 14   Ht 6\' 2"  (1.88 m)   Wt 101.9 kg   SpO2 98%   BMI 28.84 kg/m  Vital signs in last 24 hours: Temp:  [98 F (36.7 C)-98.8 F (37.1 C)] 98.3 F (36.8 C) (08/09 0830) Pulse Rate:  [39-81] 74 (08/09 0900) Resp:  [10-18] 14 (08/09 0900) BP: (96-128)/(47-70) 115/68 (08/09 0900) SpO2:  [92 %-100 %] 98 % (08/09 0900) Weight:  [101.9 kg] 101.9 kg (08/09 0500) General sitting in bed eating breakfast HEENT: Bandages on the head CVS: Regular rhythm Abdomen nondistended nontender Chest clear to auscultation Neurological exam Awake alert and oriented x2. Unable to tell me the correct month. Poor attention concentration but improved from yesterday Cranial nerves II to XII grossly intact Motor examination with no drift in bilateral upper extremities which she is able to hold antigravity.  He is able to lift both his legs antigravity but does not follow commands enough to keep them up for 5 seconds but there is no focal weakness. Sensation is intact to light touch  Medications  Current Facility-Administered Medications:    0.9 %  sodium chloride infusion, , Intravenous, Continuous, Sreeram, Narendranath, MD, Last Rate: 75 mL/hr at 07/25/23 0600, Infusion Verify at 07/25/23 0600   ascorbic acid (VITAMIN C) tablet 500 mg, 500 mg, Oral, BID, Sreeram, Narendranath, MD, 500 mg at 07/25/23 0855   butalbital-acetaminophen-caffeine (FIORICET) 50-325-40 MG per tablet 2 tablet, 2 tablet, Oral, Q8H PRN, [name], MD, 2 tablet at 07/25/23 1008   Chlorhexidine Gluconate Cloth 2 % PADS 6 each, 6 each, Topical, Daily, Dgayli, Lianne Bushy, MD, 6 each at 07/24/23 1050   clotrimazole (LOTRIMIN) 1 % cream, , Topical, BID, [name], MD, Given at 07/25/23 0857   feeding supplement (ENSURE ENLIVE / ENSURE  PLUS) liquid 237 mL, 237 mL, Oral, TID BM, Sreeram, Narendranath, MD, 237 mL at 07/24/23 1049   Gerhardt's butt cream, , Topical, TID, [name], MD, Given at 07/25/23 1610   hydrALAZINE (APRESOLINE) injection 10 mg, 10 mg, Intravenous, Q4H PRN, Paliwal, Aditya, MD, 10 mg at 07/23/23 1353   insulin aspart (novoLOG) injection 0-6 Units, 0-6 Units, Subcutaneous, TID WC, [name], MD, 1 Units at 07/24/23 1646   labetalol (NORMODYNE) injection 10 mg, 10 mg, Intravenous, Q2H PRN, Paliwal, Aditya, MD   lactulose (CHRONULAC) enema 200 gm, 300 mL, Rectal, BID, Sreeram, Narendranath, MD, 300 mL at 07/25/23 0856   levETIRAcetam (KEPPRA) IVPB 1000 mg/100 mL premix, 1,000 mg, Intravenous, Q12H, [name], MD, Last Rate: 400 mL/hr at 07/25/23 0857, 1,000 mg at 07/25/23 0857   levothyroxine (SYNTHROID) tablet 100 mcg, 100 mcg, Oral, Q0600, [name], MD, 100 mcg at 07/25/23 9604   melatonin tablet 10 mg, 10 mg, Oral, QHS, [name], MD, 10 mg at 07/23/23 2144   multivitamin with minerals tablet 1 tablet, 1 tablet, Oral, Daily, [name], MD, 1 tablet at 07/25/23 0855   Oral care mouth rinse, 15 mL, Mouth Rinse, 4 times per day, Raechel Chute, MD, 15 mL at 07/25/23 0858   Oral care mouth rinse, 15 mL, Mouth Rinse, PRN, Dgayli, Khabib, MD, 15 mL at 07/15/23 0400   pantoprazole (PROTONIX) EC tablet 40 mg, 40 mg, Oral, BID, [name], MD, 40 mg at 07/25/23 [phone]  polyethylene glycol (MIRALAX / GLYCOLAX) packet 17 g, 17 g, Oral, Daily PRN, Rust-Chester, Britton L, NP   polyvinyl alcohol (LIQUIFILM TEARS) 1.4 % ophthalmic solution 1 drop, 1 drop, Both Eyes, PRN, [name], MD, 1 drop at 07/19/23 0908   rifaximin (XIFAXAN) tablet 550 mg, 550 mg, Oral, BID, [name], MD, 550 mg at 07/25/23 0855   sodium chloride tablet 1 g, 1 g, Oral, BID WC, Marcelino Duster, MD, 1 g at 07/25/23 0855   terazosin (HYTRIN) capsule 5 mg, 5 mg, Oral, QHS, [name], MD, 5 mg at 07/24/23 2307   vancomycin (VANCOREADY) IVPB 1250  mg/250 mL, 1,250 mg, Intravenous, Q12H, Aleda Grana, RPH, Last Rate: 166.7 mL/hr at 07/25/23 0445, 1,250 mg at 07/25/23 0445  Labs CBC    Component Value Date/Time   WBC 3.6 (L) 07/25/2023 0319   RBC 2.86 (L) 07/25/2023 0319   HGB 9.5 (L) 07/25/2023 0319   HGB 12.3 (L) 05/01/2014 1553   HCT 26.2 (L) 07/25/2023 0319   HCT 36.6 (L) 05/01/2014 1553   PLT 48 (L) 07/25/2023 0319   PLT 72 (L) 05/01/2014 1553   MCV 91.6 07/25/2023 0319   MCV 96 05/01/2014 1553   MCH 33.2 07/25/2023 0319   MCHC 36.3 (H) 07/25/2023 0319   RDW 15.1 07/25/2023 0319   RDW 15.8 (H) 05/01/2014 1553   LYMPHSABS 0.6 (L) 07/15/2023 2139   MONOABS 0.6 07/15/2023 2139   EOSABS 0.0 07/15/2023 2139   BASOSABS 0.0 07/15/2023 2139   CMP     Component Value Date/Time   NA 133 (L) 07/25/2023 0319   NA 136 05/01/2014 1553   K 3.6 07/25/2023 0319   K 4.2 05/01/2014 1553   CL 105 07/25/2023 0319   CL 105 05/01/2014 1553   CO2 23 07/25/2023 0319   CO2 28 05/01/2014 1553   GLUCOSE 126 (H) 07/25/2023 0319   GLUCOSE 178 (H) 05/01/2014 1553   BUN 14 07/25/2023 0319   BUN 27 (H) 05/01/2014 1553   CREATININE 0.70 07/25/2023 0319   CREATININE 1.49 (H) 05/01/2014 1553   CALCIUM 8.0 (L) 07/25/2023 0319   CALCIUM 9.4 05/01/2014 1553   PROT 5.6 (L) 07/24/2023 1506   PROT 7.9 05/01/2014 1553   ALBUMIN 2.5 (L) 07/24/2023 1506   ALBUMIN 4.2 05/01/2014 1553   AST 55 (H) 07/24/2023 1506   AST 43 (H) 05/01/2014 1553   ALT 39 07/24/2023 1506   ALT 41 05/01/2014 1553   ALKPHOS 71 07/24/2023 1506   ALKPHOS 46 05/01/2014 1553   BILITOT 2.0 (H) 07/24/2023 1506   BILITOT 0.6 05/01/2014 1553   GFRNONAA >60 07/25/2023 0319   GFRNONAA 47 (L) 05/01/2014 1553   GFRAA >60 09/23/2019 1305   GFRAA 55 (L) 05/01/2014 1553   Lipid Panel     Component Value Date/Time   CHOL 145 04/06/2022 1804   TRIG 122 04/06/2022 1804   HDL 43 04/06/2022 1804   CHOLHDL 3.4 04/06/2022 1804   VLDL 24 04/06/2022 1804   LDLCALC 78  04/06/2022 1804    Lab Results  Component Value Date   HGBA1C 5.4 06/17/2023  Ammonia also elevated at 65 yesterday  Imaging I have reviewed images in epic and the results pertinent to this consultation are: Most recent one of the brain from yesterday which was performed with and without contrast shows bilateral holohemispheric extra-axial collections measuring up to 9 mm on the right and 8 mm on the left.  There is mass effect on both superior hemispheres  with no midline shift.  No specific features of infection were noted.  EEG - extended duration of 1h: no seizures. Excessive myogenic artifact. Generalized slowing.  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 Routine EEG showed no epileptiform abnormality but suspicion remains high for possible nonconvulsive seizures. ID has been consulted-MRSE on blood cultures.  Unable to do spinal tap.  MRI brain was completed which shows no evidence of infection intracranially but shows decent sized extra-axial collections bilaterally with mass effect on both superior hemispheres. For me, he was drowsy and kept falling asleep during the exam but was able to follow all commands yesterday and today is more awake and participatory with exam with decreased attention concentration but no clear focal deficits. I suspect that his waxing and waning mentation and lower level of consciousness is because of bilateral hemispheric subdural collections causing mass effect. As said in my note before, I do not see an urgent need for transfer for continuous  EEG but am not averse to repeating spot EEGs. Yesterday, an  hour long EEG was done and only showed generalized slowing. Will repeat another one today.  In addition to mass effect from subdural collections, the bacteremia and hyperammonemia also may be contributing to his altered mentation.    Suspicion for nonconvulsive status epilepticus is low.  IMPRESSION Fluctuating mental status likely from bilateral hemispheric subdural collections causing mass effect  Recommendations: Repeat EEG OK to continue Keppra for now and at least short term for few months. Check Ammonia Management of hyperammonemia per primary team  Follow up with NSGY and Neurology outpatient   Plan was discussed with wife  at bedside.  Plan was relayed to Dr. [name]  Will follow EEG and will be available for recall as needed. Please do not hesitate to call.  -- Milon Dikes, MD Neurologist [facility] Pager: [phone]

## 2023-07-25 NOTE — Procedures (Signed)
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 EEG to evaluate for seizure  Level of alertness: Awake   AEDs during EEG study: LEV   Technical aspects: This EEG study was done with scalp electrodes positioned according to the 10-20 International system of electrode placement. Electrical activity was reviewed with band pass filter of 1-70Hz , sensitivity of 7 uV/mm, display speed of 49mm/sec with a 60Hz  notched filter applied as appropriate. EEG data were recorded continuously and digitally stored.  Video monitoring was available and reviewed as appropriate.   Description: EEG showed continuous generalized 3 to 6 Hz theta-delta slowing. Hyperventilation and photic stimulation were not performed.      Of note, EEG was technically difficult due to significant myogenic artifact.    ABNORMALITY - Continuous slow, generalized   IMPRESSION: This technically difficult  study is suggestive of moderate to severe diffuse encephalopathy, nonspecific etiology. No seizures or epileptiform discharges were seen throughout the recording.    Annabelle Harman

## 2023-07-25 NOTE — Progress Notes (Signed)
Nutrition Follow Up Note   DOCUMENTATION CODES:   Not applicable  INTERVENTION:   Ensure Enlive po TID, each supplement provides 350 kcal and 20 grams of protein.  Magic cup TID with meals, each supplement provides 290 kcal and 9 grams of protein  MVI po daily   Vitamin C 500mg  po BID   Pt at high refeed risk; recommend monitor potassium, magnesium and phosphorus labs daily until stable  NUTRITION DIAGNOSIS:   Inadequate oral intake related to acute illness as evidenced by per patient/family report. 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  Pt continues to have poor appetite and oral intake in hospital secondary to AMS; pt eating <20% of meals. Pt did eat some eggs and pureed food for breakfast this morning. Pt refusing most Ensure. RD spoke with MD regarding the recommendation for NGT placement and nutrition support. Family has decided against NGT. Recommend encouragement of supplements to help pt meet his estimated needs. Pt remains at high refeed risk. SLP following. Per chart, pt appears to be at his UBW. Pt is at high risk for developing malnutrition.   Medications reviewed and include: vitamin C, insulin, lactulose, synthroid, melatonin, MVI, protonix, xifaxan, NaCl tabs, NaCl @75ml /hr, vancomycin   Labs reviewed: Na 133(L), K 3.6 wnl, P 2.7 wnl, Mg 1.6(L) Wbc- 3.6(L), Hgb 9.5(L), Hct 26.2(L) Cbgs- 188, 124 x 24 hrs  Diet Order:   Diet Order             DIET DYS 2 Room service appropriate? Yes with Assist; Fluid consistency: Thin  Diet effective  now                  EDUCATION NEEDS:   No education needs have been identified at this time  Skin:  Skin Assessment: Reviewed RN Assessment (incision head, Stage I buttocks)  Last BM:  8/9- TYPE 7  Height:   Ht Readings from Last 1 Encounters:  07/14/23 6\' 2"  (1.88 m)    Weight:   Wt Readings from Last 1 Encounters:  07/25/23 101.9 kg    Ideal Body Weight:  86.3 kg  BMI:  Body mass index is 28.84 kg/m.  Estimated Nutritional Needs:   Kcal:  2000-2300kcal/day  Protein:  100-115g/day  Fluid:  2.0-2.3L/day  Betsey Holiday MS, RD, LDN Please refer to [facility] for RD and/or RD on-call/weekend/after hours pager

## 2023-07-25 NOTE — Progress Notes (Signed)
Pharmacy Antibiotic Note  Joshua Berry is a 77 y.o. male admitted on 07/14/2023 with meningitis.  Pharmacy has been consulted for vancomycin and cefepime dosing. Patient s/p elective neurosurgery bilateral burr holes on 7/29 for drainage of chronic SDH.  Bilateral ventriculostomy drains placed in OR and removed on 7/31.  Patient now having fever and altered mental status (PMH non-alcoholic cirrhosis with hepatic encephalopathy)  Today, 07/25/2023 Day #4 vancomycin  Tm/24h afebrile WBC 3.6 Renal: SCR WNL and at baseline- stable 8/6 Procalcitonin < 0.1 Blood cultures: 4/4 Botltles, BCID = MRSE   (prev blood cx from 7/29 were no growth) Repeat Blood cultures pending  Vancomycin levels: 8/9 vancomycin trough at 03:19 on 1250mg  IV q12h = 13 mcg/mL (prior to 5 maintenance dose), previous dose given at 16:36 8/8  Plan: Continue Vancomycin 1250mg  IV q12 despite trough being little below goal. I anticipate vancomycin level will trend up a little more with subsequent dosing.  Hesitant to increase dose now based on patient's age and increase risk of kidney injury.  Will recheck level in 3-4 days.  Sooner if worsens from infection standpoint Goal vancomycin trough 15-59mcg/mL for CNS infection Estimated trough with this dose is 15.5 mcg/ml using an estimated CrCl of 20ml/min based on age.   Follow renal function and blood cultures Recheck vancomycin trough in 3-4 days   Height: 6\' 2"  (188 cm) Weight: 101.9 kg (224 lb 10.4 oz) IBW/kg (Calculated) : 82.2  Temp (24hrs), Avg:98.5 F (36.9 C), Min:98.2 F (36.8 C), Max:98.8 F (37.1 C)  Recent Labs  Lab 07/21/23 0403 07/22/23 0851 07/23/23 0356 07/24/23 0422 07/25/23 0319  WBC 3.5* 4.6 4.4 4.4 3.6*  CREATININE 0.70 0.76 0.72 0.73 0.70  VANCOTROUGH  --   --   --   --  13*    Estimated Creatinine Clearance: 97 mL/min (by C-G formula based on SCr of 0.7 mg/dL).    Allergies  Allergen Reactions   Nsaids     Other Reaction(s): Ascites    Camphor Rash and Swelling   Nickel Rash   Sulfur Rash and Swelling   Sulfamethoxazole Swelling   Latex Rash   Lisinopril Hives    Other Reaction(s): Cough    Antimicrobials this admission: 8/6 vanco >>  8/6 cefepime >> 8/7  Dose adjustments this admission:   Microbiology results: 8/8 Bcx: pend 8/6 BCx: MRSE 7/29 BCx: NG-F 7/29 MRSA PCR: neg  Thank you for allowing pharmacy to be a part of this patient's care.  Juliette Alcide, PharmD, BCPS, BCIDP Work Cell: [phone] 07/25/2023 12:13 PM

## 2023-07-25 NOTE — Progress Notes (Signed)
Date of Admission:  07/14/2023      ID: Joshua Berry is a 77 y.o. male  Principal Problem:   Subdural hematoma (HCC) Active Problems:   Cirrhosis of liver without ascites (HCC)   Compression of brain (HCC)   Uncal herniation (HCC)   Acute nontraumatic intracranial subdural hematoma (HCC)   Staphylococcus epidermidis bacteremia    Subjective: Pt is more alert today Wife at bed side  Medications:   vitamin C  500 mg Oral BID   Chlorhexidine Gluconate Cloth  6 each Topical Daily   clotrimazole   Topical BID   feeding supplement  237 mL Oral TID BM   Gerhardt's butt cream   Topical TID   insulin aspart  0-6 Units Subcutaneous TID WC   lactulose  300 mL Rectal BID   levothyroxine  100 mcg Oral Q0600   melatonin  10 mg Oral QHS   multivitamin with minerals  1 tablet Oral Daily   mouth rinse  15 mL Mouth Rinse 4 times per day   pantoprazole  40 mg Oral BID   rifaximin  550 mg Oral BID   sodium chloride  1 g Oral BID WC   terazosin  5 mg Oral QHS    Objective: Vital signs in last 24 hours: Patient Vitals for the past 24 hrs:  BP Temp Temp src Pulse Resp SpO2 Weight  07/25/23 1121 -- 98.3 F (36.8 C) Oral 72 15 98 % --  07/25/23 1100 120/64 -- -- 69 13 96 % --  07/25/23 1000 (!) 116/51 -- -- 69 12 98 % --  07/25/23 0900 115/68 -- -- 74 14 98 % --  07/25/23 0830 -- 98.3 F (36.8 C) Oral 63 10 92 % --  07/25/23 0803 -- -- -- 60 13 94 % --  07/25/23 0800 122/60 -- -- 60 12 97 % --  07/25/23 0700 119/60 -- -- 62 18 92 % --  07/25/23 0600 (!) 117/58 -- -- 70 13 92 % --  07/25/23 0500 (!) 102/47 -- -- (!) 56 13 96 % 101.9 kg  07/25/23 0400 128/70 98.8 F (37.1 C) Oral 70 13 97 % --  07/25/23 0300 (!) 122/57 -- -- 65 16 98 % --  07/25/23 0200 115/61 -- -- 73 13 95 % --  07/25/23 0100 (!) 109/52 -- -- 76 12 97 % --  07/25/23 0000 (!) 111/51 98.7 F (37.1 C) Oral 77 15 97 % --  07/24/23 2300 119/62 -- -- 74 14 99 % --  07/24/23 2200 (!) 116/56 -- -- 79 16 100 % --   07/24/23 2100 (!) 112/53 -- -- 73 15 100 % --  07/24/23 2000 (!) 111/57 98.8 F (37.1 C) Oral 72 15 98 % --  07/24/23 1900 111/61 -- -- 75 16 100 % --  07/24/23 1800 (!) 116/59 -- -- 81 17 98 % --  07/24/23 1700 119/60 -- -- (!) 39 14 100 % --  07/24/23 1600 (!) 108/49 98.2 F (36.8 C) Oral 67 12 98 % --  07/24/23 1500 123/66 -- -- 64 17 99 % --  07/24/23 1400 (!) 117/59 -- -- 66 13 98 % --      PHYSICAL EXAM:  General: awake, more alert afebrile Head:scalp surgical sites look okay  Eyes: pupils are equal Lungs: Clear to auscultation bilaterally. No Wheezing or Rhonchi. No rales. Heart: s1s2 Abdomen: Soft, non-tender,not distended. Bowel sounds normal. No masses Extremities: atraumatic, no cyanosis. No edema. No  clubbing Skin: No rashes or lesions. Or bruising Lymph: Cervical, supraclavicular normal. Neurologic: moves all limbs  Lab Results    Latest Ref Rng & Units 07/25/2023    3:19 AM 07/24/2023    4:22 AM 07/23/2023    3:56 AM  CBC  WBC 4.0 - 10.5 K/uL 3.6  4.4  4.4   Hemoglobin 13.0 - 17.0 g/dL 9.5  9.9  69.6   Hematocrit 39.0 - 52.0 % 26.2  26.7  28.9   Platelets 150 - 400 K/uL 48  52  50        Latest Ref Rng & Units 07/25/2023    3:19 AM 07/24/2023    3:06 PM 07/24/2023    4:22 AM  CMP  Glucose 70 - 99 mg/dL 295   284   BUN 8 - 23 mg/dL 14   18   Creatinine 1.32 - 1.24 mg/dL 4.40   1.02   Sodium 725 - 145 mmol/L 133   132   Potassium 3.5 - 5.1 mmol/L 3.6   3.8   Chloride 98 - 111 mmol/L 105   103   CO2 22 - 32 mmol/L 23   21   Calcium 8.9 - 10.3 mg/dL 8.0   8.3   Total Protein 6.5 - 8.1 g/dL  5.6    Total Bilirubin 0.3 - 1.2 mg/dL  2.0    Alkaline Phos 38 - 126 U/L  71    AST 15 - 41 U/L  55    ALT 0 - 44 U/L  39        Microbiology: Bc- staph epidermidis X 4 Studies/Results: EEG adult  Result Date: 07/24/2023 Charlsie Quest, MD     07/24/2023  6:02 PM Patient Name: Joshua Berry MRN: [id number] Epilepsy Attending: Charlsie Quest Referring  Physician/Provider: Milon Dikes, MD Date: 07/24/2023 Duration: 1 hour 4 mins Patient history: 77yo M presented for evaluation of altered mental status and was found to have 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 Level of alertness: Awake AEDs during EEG study: LEV Technical aspects: This EEG study was done with scalp electrodes positioned according to the 10-20 International system of electrode placement. Electrical activity was reviewed with band pass filter of 1-70Hz , sensitivity of 7 uV/mm, display speed of 53mm/sec with a 60Hz  notched filter applied as appropriate. EEG data were recorded continuously and digitally stored.  Video monitoring was available and reviewed as appropriate. Description: EEG showed continuous generalized 3 to 6 Hz theta-delta slowing. Hyperventilation and photic stimulation were not performed.   Of note, EEG was technically difficult due to significant myogenic artifact. ABNORMALITY - Continuous slow, generalized IMPRESSION: This technically difficult  study is suggestive of moderate to severe diffuse encephalopathy, nonspecific etiology. No seizures or epileptiform discharges were seen throughout the recording. Priyanka Annabelle Harman   US Venous Img Upper Uni Right(DVT)  Result Date: 07/24/2023 CLINICAL DATA:  Right upper extremity swelling. EXAM: RIGHT UPPER EXTREMITY VENOUS DOPPLER ULTRASOUND TECHNIQUE: Gray-scale sonography with graded compression, as well as color Doppler and duplex ultrasound were performed to evaluate the upper extremity deep venous system from the level of the subclavian vein and including the jugular, axillary, basilic, radial, ulnar and upper cephalic vein. Spectral Doppler was utilized to evaluate flow at rest and with distal augmentation maneuvers. COMPARISON:  None Available. FINDINGS: Contralateral Subclavian Vein: Respiratory phasicity is normal and symmetric with the symptomatic side. No evidence of thrombus. Normal compressibility. Internal Jugular Vein: No evidence of thrombus. Normal compressibility, respiratory phasicity and response to augmentation. Subclavian Vein: No evidence of thrombus. Normal compressibility, respiratory phasicity and response to augmentation. Axillary Vein: No evidence of thrombus. Normal compressibility, respiratory phasicity and response to augmentation. Cephalic Vein: No evidence of thrombus. Normal compressibility, respiratory phasicity and response to augmentation. Basilic Vein: No evidence of thrombus. Normal compressibility, respiratory phasicity and response to augmentation. Brachial Veins: No evidence of thrombus. Normal compressibility, respiratory phasicity and response to augmentation. Radial Veins: No evidence of thrombus. Normal compressibility, respiratory phasicity and response to augmentation. Ulnar Veins: No evidence of thrombus. Normal compressibility, respiratory phasicity and response to augmentation. Venous Reflux:  None visualized. Other Findings:  None visualized. IMPRESSION: No evidence of DVT within the RIGHT upper extremity. Electronically Signed   By: Aram Candela M.D.   On: 07/24/2023 01:36   MR BRAIN W WO CONTRAST  Result Date: 07/23/2023 CLINICAL DATA:  ALTERED MENTAL STATUS EXAM: MRI HEAD WITHOUT AND WITH CONTRAST TECHNIQUE: Multiplanar, multiecho pulse sequences of the brain and surrounding structures were obtained without and with intravenous contrast. CONTRAST:  10mL GADAVIST GADOBUTROL 1 MMOL/ML IV SOLN COMPARISON:  04/05/2023 brain MRI 07/21/2023 head CT FINDINGS: Brain: There are bilateral holo hemispheric extra-axial collections, measuring 9 mm on the right and 8 mm on the left. There are areas of diffusion restriction within the collection, which may be due to the presence of blood clot or  field inhomogeneity caused by pneumocephalus. Moderate anterior pneumocephalus. No acute infarct. Mass effect on both superior hemispheres. No midline shift. No hydrocephalus. The midline structures are normal. There is multifocal hyperintense T2-weighted signal within the periventricular and deep white matter. No abnormal contrast enhancement. Vascular: Major flow voids are preserved. Skull and upper cervical spine: Bifrontal burr holes. Sinuses/Orbits:No paranasal sinus fluid levels or advanced mucosal thickening. No mastoid or middle ear effusion. Normal orbits. IMPRESSION: 1. Bilateral holohemispheric extra-axial collections, measuring 9 mm on the right and 8 mm on the left. 2. Mass effect on both superior hemispheres. No midline shift. 3. No specific features of infection. Electronically Signed   By: Deatra Robinson M.D.   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

## 2023-07-26 DIAGNOSIS — G935 Compression of brain: Secondary | ICD-10-CM | POA: Diagnosis not present

## 2023-07-26 DIAGNOSIS — S065XAA Traumatic subdural hemorrhage with loss of consciousness status unknown, initial encounter: Secondary | ICD-10-CM | POA: Diagnosis not present

## 2023-07-26 DIAGNOSIS — K7460 Unspecified cirrhosis of liver: Secondary | ICD-10-CM | POA: Diagnosis not present

## 2023-07-26 DIAGNOSIS — R7881 Bacteremia: Secondary | ICD-10-CM | POA: Diagnosis not present

## 2023-07-26 LAB — GLUCOSE, CAPILLARY
Glucose-Capillary: 111 mg/dL — ABNORMAL HIGH (ref 70–99)
Glucose-Capillary: 229 mg/dL — ABNORMAL HIGH (ref 70–99)
Glucose-Capillary: 181 mg/dL — ABNORMAL HIGH (ref 70–99)
Glucose-Capillary: 121 mg/dL — ABNORMAL HIGH (ref 70–99)

## 2023-07-26 MED ORDER — MAGNESIUM SULFATE 4 GM/100ML IV SOLN
4.0000 g | Freq: Once | INTRAVENOUS | Status: AC
  Administered 2023-07-26: 4 g via INTRAVENOUS
  Filled 2023-07-26: qty 100

## 2023-07-26 NOTE — Plan of Care (Signed)
  Problem: Health Behavior/Discharge Planning: Goal: Ability to manage health-related needs will improve Outcome: Progressing   Problem: Clinical Measurements: Goal: Ability to maintain clinical measurements within normal limits will improve Outcome: Progressing Goal: Will remain free from infection Outcome: Progressing Goal: Diagnostic test results will improve Outcome: Progressing Goal: Respiratory complications will improve Outcome: Progressing Goal: Cardiovascular complication will be avoided Outcome: Progressing   Problem: Activity: Goal: Risk for activity intolerance will decrease Outcome: Progressing   Problem: Nutrition: Goal: Adequate nutrition will be maintained Outcome: Progressing   Problem: Coping: Goal: Level of anxiety will decrease Outcome: Progressing   Problem: Elimination: Goal: Will not experience complications related to bowel motility Outcome: Progressing Goal: Will not experience complications related to urinary retention Outcome: Progressing   Problem: Pain Managment: Goal: General experience of comfort will improve Outcome: Progressing   Problem: Safety: Goal: Ability to remain free from injury will improve Outcome: Progressing   Problem: Skin Integrity: Goal: Risk for impaired skin integrity will decrease Outcome: Progressing   Problem: Education: Goal: Ability to describe self-care measures that may prevent or decrease complications (Diabetes Survival Skills Education) will improve Outcome: Progressing Goal: Individualized Educational Video(s) Outcome: Progressing   Problem: Coping: Goal: Ability to adjust to condition or change in health will improve Outcome: Progressing   Problem: Fluid Volume: Goal: Ability to maintain a balanced intake and output will improve Outcome: Progressing   Problem: Health Behavior/Discharge Planning: Goal: Ability to identify and utilize available resources and services will improve Outcome:  Progressing Goal: Ability to manage health-related needs will improve Outcome: Progressing   Problem: Metabolic: Goal: Ability to maintain appropriate glucose levels will improve Outcome: Progressing   Problem: Nutritional: Goal: Maintenance of adequate nutrition will improve Outcome: Progressing Goal: Progress toward achieving an optimal weight will improve Outcome: Progressing   Problem: Skin Integrity: Goal: Risk for impaired skin integrity will decrease Outcome: Progressing   Problem: Tissue Perfusion: Goal: Adequacy of tissue perfusion will improve Outcome: Progressing   Problem: Education: Goal: Ability to describe self-care measures that may prevent or decrease complications (Diabetes Survival Skills Education) will improve Outcome: Progressing   Problem: Coping: Goal: Ability to adjust to condition or change in health will improve Outcome: Progressing   Problem: Fluid Volume: Goal: Ability to maintain a balanced intake and output will improve Outcome: Progressing   Problem: Health Behavior/Discharge Planning: Goal: Ability to identify and utilize available resources and services will improve Outcome: Progressing Goal: Ability to manage health-related needs will improve Outcome: Progressing   Problem: Metabolic: Goal: Ability to maintain appropriate glucose levels will improve Outcome: Progressing   Problem: Nutritional: Goal: Maintenance of adequate nutrition will improve Outcome: Progressing Goal: Progress toward achieving an optimal weight will improve Outcome: Progressing   Problem: Skin Integrity: Goal: Risk for impaired skin integrity will decrease Outcome: Progressing   Problem: Tissue Perfusion: Goal: Adequacy of tissue perfusion will improve Outcome: Progressing

## 2023-07-26 NOTE — Evaluation (Signed)
Physical Therapy Evaluation Patient Details Name: Joshua Berry MRN: [id number] DOB: 08-21-1946 Today's Date: 07/26/2023  History of Present Illness  Pt is a 77 year old male admitted with Acute Bilateral Subdural Hematomas  s/p bilateral frontal burr hole washout, acute metabolic encephalopathy;    PMH significant for liver cirrhosis, portal HTN, TIPS, GIB/GAVE, pancytopenia, OSA, TAVR, left shoulder replacement, multiple episodes of bacteremia requiring prolonged IV abx treatment   Clinical Impression  Patient received in bed, lethargic, eyes shut. Answers simple questions. He required total +2 assist for bed mobility. Poor sitting balance with hands on assist needed at all times. Demonstrates posterior and right lateral leaning. Patient unsafe to attempt standing at this time. He will continue to benefit from skilled PT to improve strength and functional independence.         If plan is discharge home, recommend the following: Two people to help with walking and/or transfers;Two people to help with bathing/dressing/bathroom;Assist for transportation;Direct supervision/assist for medications management;Assistance with feeding;Direct supervision/assist for financial management;Assistance with cooking/housework   Can travel by private vehicle   No    Equipment Recommendations None recommended by PT  Recommendations for Other Services       Functional Status Assessment Patient has had a recent decline in their functional status and demonstrates the ability to make significant improvements in function in a reasonable and predictable amount of time.     Precautions / Restrictions Precautions Precautions: Fall Restrictions Weight Bearing Restrictions: No      Mobility  Bed Mobility Overal bed mobility: Needs Assistance Bed Mobility: Supine to Sit, Sit to Supine     Supine to sit: Total assist, +2 for physical assistance, HOB elevated, Used rails Sit to supine: Total assist, +2 for  physical assistance, Used rails   General bed mobility comments: assist for BLE    Transfers                   General transfer comment: patient with poor sitting balance, and poor alertness this session. Not safe to attempt    Ambulation/Gait                  Stairs            Wheelchair Mobility     Tilt Bed    Modified Rankin (Stroke Patients Only)       Balance Overall balance assessment: Needs assistance Sitting-balance support: Feet supported Sitting balance-Leahy Scale: Poor Sitting balance - Comments: requires at least MIN A progressing to MOD A seated on edge of bed; pt attempts to self correct with cueing however unable to maintain Postural control: Posterior lean, Right lateral lean                                   Pertinent Vitals/Pain Pain Assessment Pain Assessment: No/denies pain Pain Intervention(s): Monitored during session, Repositioned    Home Living Family/patient expects to be discharged to:: Skilled nursing facility (Simultaneous filing. User may not have seen previous data.)   Available Help at Discharge: Family;Personal care attendant (Simultaneous filing. User may not have seen previous data.) Type of Home:  (from assisted living  Simultaneous filing. User may not have seen previous data.)           Home Equipment: Rollator (4 wheels);Cane - single point;Shower Counsellor (2 wheels);Wheelchair - manual;Other (comment) (Simultaneous filing. User may not have seen previous data.) Additional Comments: information  taken from previous evaluation- pt is poor historian on this date    Prior Function Prior Level of Function : Independent/Modified Independent (Simultaneous filing. User may not have seen previous data.)             Mobility Comments: MOD I with rollator to toilet and dining hall (Simultaneous filing. User may not have seen previous data.) ADLs Comments: patient reports he is  independent at baseline     Extremity/Trunk Assessment   Upper Extremity Assessment Upper Extremity Assessment: Defer to OT evaluation    Lower Extremity Assessment Lower Extremity Assessment: Generalized weakness    Cervical / Trunk Assessment Cervical / Trunk Assessment: Normal  Communication   Communication Communication: Difficulty communicating thoughts/reduced clarity of speech;Difficulty following commands/understanding Following commands: Follows one step commands inconsistently Cueing Techniques: Verbal cues;Gestural cues;Tactile cues;Visual cues  Cognition Arousal: Lethargic Behavior During Therapy: Flat affect Overall Cognitive Status: Impaired/Different from baseline Area of Impairment: Orientation, Following commands, Safety/judgement, Awareness, Problem solving                 Orientation Level: Disoriented to, Time, Situation Current Attention Level: Focused Memory: Decreased short-term memory Following Commands: Follows one step commands inconsistently Safety/Judgement: Decreased awareness of safety, Decreased awareness of deficits Awareness: Intellectual Problem Solving: Slow processing, Decreased initiation, Difficulty sequencing, Requires verbal cues, Requires tactile cues General Comments: cues to sequence transfer        General Comments General comments (skin integrity, edema, etc.): vss on RA throughout    Exercises     Assessment/Plan    PT Assessment Patient needs continued PT services  PT Problem List Decreased strength;Decreased range of motion;Decreased activity tolerance;Decreased balance;Decreased mobility;Decreased safety awareness;Decreased knowledge of use of DME;Decreased cognition;Decreased coordination       PT Treatment Interventions DME instruction;Gait training;Functional mobility training;Therapeutic activities;Therapeutic exercise;Balance training;Patient/family education    PT Goals (Current goals can be found in the  Care Plan section)  Acute Rehab PT Goals Patient Stated Goal: reurn to a ALF PT Goal Formulation: With patient Time For Goal Achievement: 08/09/23 Potential to Achieve Goals: Fair    Frequency Min 1X/week     Co-evaluation PT/OT/SLP Co-Evaluation/Treatment: Yes Reason for Co-Treatment: For patient/therapist safety;To address functional/ADL transfers;Necessary to address cognition/behavior during functional activity PT goals addressed during session: Mobility/safety with mobility;Balance OT goals addressed during session: ADL's and self-care       AM-PAC PT "6 Clicks" Mobility  Outcome Measure Help needed turning from your back to your side while in a flat bed without using bedrails?: Total Help needed moving from lying on your back to sitting on the side of a flat bed without using bedrails?: Total Help needed moving to and from a bed to a chair (including a wheelchair)?: Total Help needed standing up from a chair using your arms (e.g., wheelchair or bedside chair)?: Total Help needed to walk in hospital room?: Total Help needed climbing 3-5 steps with a railing? : Total 6 Click Score: 6    End of Session   Activity Tolerance: Patient limited by lethargy Patient left: in bed;with call bell/phone within reach;with bed alarm set Nurse Communication: Mobility status PT Visit Diagnosis: Muscle weakness (generalized) (M62.81);Other abnormalities of gait and mobility (R26.89)    Time: 3244-0102 PT Time Calculation (min) (ACUTE ONLY): 16 min   Charges:   PT Evaluation $PT Eval Moderate Complexity: 1 Mod   PT General Charges $$ ACUTE PT VISIT: 1 Visit          , PT, [facility] 07/26/23,12:26  PM

## 2023-07-26 NOTE — TOC Progression Note (Signed)
Transition of Care [facility]) - Progression Note    Patient Details  Name: Joshua Berry MRN: [id number] Date of Birth: 07-Aug-1946  [facility] CM/SW Contact  Liliana Cline, LCSW Phone Number: 07/26/2023, 11:09 AM  Clinical Narrative:    CSW called [facility] Representative Latoya, left VM requesting return call if there are any updates over the weekend.      Barriers to Discharge: Continued Medical Work up  Expected Discharge Plan and Services       Living arrangements for the past 2 months: Assisted Living Facility                                       Social Determinants of Health (SDOH) Interventions SDOH Screenings   Food Insecurity: No Food Insecurity (07/18/2023)  Housing: Low Risk  (07/18/2023)  Transportation Needs: No Transportation Needs (07/18/2023)  Recent Concern: Transportation Needs - Unmet Transportation Needs (06/17/2023)  Utilities: Not At Risk (07/18/2023)  Recent Concern: Utilities - At Risk (06/17/2023)  Depression (PHQ2-9): Low Risk  (04/24/2023)  Social Connections: Unknown (04/29/2022)   [facility] from [facility]  Tobacco Use: Medium Risk (07/14/2023)    Readmission Risk Interventions    07/15/2023    8:52 AM 06/18/2023   10:19 AM 05/17/2022    1:45 PM  Readmission Risk Prevention Plan  Transportation Screening Complete Complete Complete  PCP or Specialist Appt within 5-7 Days  Complete Complete  PCP or Specialist Appt within 3-5 Days Complete    Home Care Screening  Complete Complete  Medication Review (RN CM)  Complete Complete  Social Work Consult for Recovery Care Planning/Counseling Complete    Palliative Care Screening Not Applicable    Medication Review Oceanographer) Referral to Pharmacy

## 2023-07-26 NOTE — Progress Notes (Signed)
Neurosurgery visit note Patient seen and examined.  Transferred up to the floor now status post bur hole drainage several days ago.  Denies any headaches.  Notes he has some shoulder and arm pain  The patient patient is ANO times name, [facility] and 2024, cranial nerves appear grossly intact in his upper extremity physical strength and sensation. Incisions clean dry and intact.  AP: Overall the patient has stable neurologic exam.  Will continue to follow along.  Mobilize as tolerated from neurosurgical standpoint.  Peter Garter. Madaline Brilliant, MD Neurosurgery

## 2023-07-26 NOTE — Consult Note (Signed)
PHARMACY CONSULT NOTE - ELECTROLYTES  Pharmacy Consult for Electrolyte Monitoring and Replacement   Recent Labs: Potassium (mmol/L)  Date Value  07/26/2023 3.5  05/01/2014 4.2   Magnesium (mg/dL)  Date Value  16/09/9603 1.6 (L)   Calcium (mg/dL)  Date Value  54/08/8118 8.0 (L)   Calcium, Total (mg/dL)  Date Value  14/78/2956 9.4   Albumin (g/dL)  Date Value  21/30/8657 2.5 (L)  05/01/2014 4.2   Phosphorus (mg/dL)  Date Value  84/69/6295 2.6   Sodium (mmol/L)  Date Value  07/26/2023 136  05/01/2014 136   Height: 6\' 2"  (188 cm) Weight: 107.9 kg (237 lb 14 oz) IBW/kg (Calculated) : 82.2 Estimated Creatinine Clearance: 99.6 mL/min (by C-G formula based on SCr of 0.67 mg/dL).  Assessment  Joshua Berry is a 77 y.o. male presenting with acute bilateral subdural hematomas s/p bilateral frontal burr hole washout. Hospital course complicated by MRSE bacteremia and altered mental status. PMH significant for NASH with liver cirrhosis, portal HTN, TIPS, GIB/GAVE, pancytopenia, OSA, TAVR, multiple episodes of bacteremia requiring prolonged IV antibiotic therapy. Pharmacy has been consulted to monitor and replace electrolytes.  Diet: Dysphagia 2 MIVF: NS @ 75 mL/hr Pertinent medications: NaCl 1 g BIDM  Goal of Therapy: Electrolytes within normal limits  Plan:  Na 133>136, continue NaCl 1 g BIDM and fluids as above Mg 1.6, will give magnesium sulfate 4 g IV x 1 Follow-up electrolytes with AM labs tomorrow  Thank you for allowing pharmacy to be a part of this patient's care.   A  07/26/2023 9:29 AM

## 2023-07-26 NOTE — Plan of Care (Signed)
  Problem: Clinical Measurements: Goal: Respiratory complications will improve Outcome: Progressing   Problem: Coping: Goal: Level of anxiety will decrease Outcome: Progressing   Problem: Health Behavior/Discharge Planning: Goal: Ability to manage health-related needs will improve Outcome: Not Progressing   Problem: Clinical Measurements: Goal: Cardiovascular complication will be avoided Outcome: Not Progressing   Problem: Nutrition: Goal: Adequate nutrition will be maintained Outcome: Not Progressing   Problem: Elimination: Goal: Will not experience complications related to urinary retention Outcome: Not Progressing   Problem: Coping: Goal: Ability to adjust to condition or change in health will improve Outcome: Not Progressing   Problem: Skin Integrity: Goal: Risk for impaired skin integrity will decrease Outcome: Not Progressing

## 2023-07-26 NOTE — Evaluation (Signed)
Occupational Therapy Re-Evaluation Patient Details Name: Joshua Berry MRN: [id number] DOB: [birth year] Today's Date: 07/26/2023   History of Present Illness Pt is a 77 year old male admitted with Acute Bilateral Subdural Hematomas  s/p bilateral frontal burr hole washout, acute metabolic encephalopathy;    PMH significant for liver cirrhosis, portal HTN, TIPS, GIB/GAVE, pancytopenia, OSA, TAVR, left shoulder replacement, multiple episodes of bacteremia requiring prolonged IV abx treatment   Clinical Impression   Chart reviewed to date, nurse cleared pt for participation in OT re-evaluation after transfer to ICU. Co tx completed with PT on this date. Pt is lethargic, cues to open eyes, reports he is tired. Pt is oriented to self and place. Pt requires TOTAL A +2 for bed mobility, poor sitting balance. Anticipate MAX-TOTAL A for ADLs at this time ,MAX A required for LB dressing. Pt is left as received, all needs met, lines/leads, rectal tube/foley in tact. Pt will benefit from ongoing OT to address deficits. OT will follow acutely.       If plan is discharge home, recommend the following: Two people to help with walking and/or transfers;Two people to help with bathing/dressing/bathroom;Help with stairs or ramp for entrance    Functional Status Assessment  Patient has had a recent decline in their functional status and demonstrates the ability to make significant improvements in function in a reasonable and predictable amount of time.  Equipment Recommendations  Other (comment) (defer)    Recommendations for Other Services       Precautions / Restrictions Precautions Precautions: Fall Restrictions Weight Bearing Restrictions: No      Mobility Bed Mobility Overal bed mobility: Needs Assistance Bed Mobility: Supine to Sit, Sit to Supine     Supine to sit: Total assist, +2 for physical assistance Sit to supine: Total assist, +2 for physical assistance        Transfers Overall  transfer level: Needs assistance                 General transfer comment: unsafe to attempt on this date, pt is lethargic with poor sitting balance      Balance Overall balance assessment: Needs assistance Sitting-balance support: Bilateral upper extremity supported, Feet supported Sitting balance-Leahy Scale: Poor Sitting balance - Comments: requires at least MIN A progressing to MOD A seated on edge of bed; pt attempts to self correct with cueing however unable to maintain Postural control: Posterior lean, Right lateral lean                                 ADL either performed or assessed with clinical judgement   ADL Overall ADL's : Needs assistance/impaired                     Lower Body Dressing: Maximal assistance                       Vision Patient Visual Report:  (pt unable to partiicpate in formal vision assessment, will continue to assess)              Pertinent Vitals/Pain Pain Assessment Pain Assessment: PAINAD Breathing: normal Negative Vocalization: none Facial Expression: smiling or inexpressive Body Language: relaxed Consolability: no need to console PAINAD Score: 0 Pain Intervention(s): Monitored during session     Extremity/Trunk Assessment Upper Extremity Assessment Upper Extremity Assessment: Generalized weakness;Difficult to assess due to impaired cognition   Lower Extremity  Assessment Lower Extremity Assessment: Generalized weakness;Difficult to assess due to impaired cognition (B feet in dorsiflexoin, can range to neutral)       Communication Communication Communication: Difficulty communicating thoughts/reduced clarity of speech;Difficulty following commands/understanding Following commands: Follows one step commands inconsistently Cueing Techniques: Verbal cues;Tactile cues;Visual cues;Gestural cues   Cognition Arousal: Lethargic Behavior During Therapy: Flat affect Overall Cognitive Status:  Impaired/Different from baseline Area of Impairment: Orientation, Attention, Memory, Following commands, Safety/judgement, Awareness, Problem solving                 Orientation Level: Disoriented to, Time, Situation Current Attention Level: Focused Memory: Decreased short-term memory Following Commands: Follows one step commands inconsistently Safety/Judgement: Decreased awareness of safety, Decreased awareness of deficits Awareness: Intellectual Problem Solving: Slow processing, Decreased initiation, Difficulty sequencing, Requires verbal cues, Requires tactile cues       General Comments  vss on RA throughout            Home Living Family/patient expects to be discharged to:: Skilled nursing facility (Simultaneous filing. User may not have seen previous data.)   Available Help at Discharge: Family;Personal care attendant (Simultaneous filing. User may not have seen previous data.) Type of Home:  (from assisted living  Simultaneous filing. User may not have seen previous data.)                       Home Equipment: Rollator (4 wheels);Cane - single point;Shower Counsellor (2 wheels);Wheelchair - manual;Other (comment) (Simultaneous filing. User may not have seen previous data.)   Additional Comments: information taken from previous evaluation- pt is poor historian on this date      Prior Functioning/Environment Prior Level of Function : Independent/Modified Independent (Simultaneous filing. User may not have seen previous data.)             Mobility Comments: MOD I with rollator to toilet and dining hall (Simultaneous filing. User may not have seen previous data.) ADLs Comments: patient reports he is independent at baseline        OT Problem List: Decreased strength;Decreased range of motion;Decreased activity tolerance;Impaired balance (sitting and/or standing);Decreased cognition;Decreased safety awareness;Decreased knowledge of use of DME or  AE      OT Treatment/Interventions: Self-care/ADL training;Therapeutic exercise;Neuromuscular education;Energy conservation;DME and/or AE instruction;Therapeutic activities;Patient/family education;Balance training    OT Goals(Current goals can be found in the care plan section) Acute Rehab OT Goals OT Goal Formulation: Patient unable to participate in goal setting ADL Goals Pt Will Perform Grooming: with min assist;sitting Pt Will Transfer to Toilet: with mod assist;stand pivot transfer  OT Frequency: Min 1X/week    Co-evaluation PT/OT/SLP Co-Evaluation/Treatment: Yes Reason for Co-Treatment: For patient/therapist safety;To address functional/ADL transfers;Necessary to address cognition/behavior during functional activity PT goals addressed during session: Mobility/safety with mobility;Balance OT goals addressed during session: ADL's and self-care      AM-PAC OT "6 Clicks" Daily Activity     Outcome Measure Help from another person eating meals?: A Lot Help from another person taking care of personal grooming?: A Lot Help from another person toileting, which includes using toliet, bedpan, or urinal?: Total Help from another person bathing (including washing, rinsing, drying)?: Total Help from another person to put on and taking off regular upper body clothing?: A Lot Help from another person to put on and taking off regular lower body clothing?: A Lot 6 Click Score: 10   End of Session Nurse Communication: Mobility status  Activity Tolerance: Patient limited by lethargy Patient left:  in bed;with call bell/phone within reach;with bed alarm set  OT Visit Diagnosis: Other abnormalities of gait and mobility (R26.89);Muscle weakness (generalized) (M62.81)                Time: 0981-1914 OT Time Calculation (min): 18 min Charges:  OT General Charges $OT Visit: 1 Visit OT Evaluation $OT Eval Moderate Complexity: 1 Mod  Oleta Mouse, OTD OTR/L  07/26/23, 12:23 PM

## 2023-07-26 NOTE — Progress Notes (Addendum)
Progress Note   [name] EXB:[id number] DOB: 1946-05-11 DOA: 07/14/2023     12 DOS: the patient was seen and examined on 07/26/2023   Brief hospital course: Joshua Berry is a 77 yo M with hx of NASH with liver cirrhosis, portal HTN, TIPS, GIB/GAVE, pancytopenia, OSA, TAVR, left shoulder replacement, multiple episodes of bacteremia requiring prolonged IV abx treatment who presented to [facility] ED from [facility] assisted living via EMS for evaluation of altered mental status.    ED workup revealed bilateral subdural hematomas with midline shift and signs of herniation. Family in agreement with surgical intervention. NSGY administered platelets and FFP to improve coagulopathy then brought patient to OR for hematoma evacuation.    Patient was in ICU managed by PCCM, then transferred to hospitalist service on 7/31. 07/21/23 he spiked fever, started on prophylactic meningitis treatment.  07/23/23 MRSE positive blood cultures. ID on board. Continue Vanco, stopped Cefepime. MRI brain rule out abscess. Infected hematoma ordered. 07/24/23 MRI of the brain bilateral holohemispheric extra-axial collections measuring 9 mm, mass effect on both superior hemispheres.  Neurosurgery team suggested MRI findings not concerning.  Advised cEEG, neurology asked for follow-up. 8/9: EEG negative for any acute seizure 8/10: Transfer to PCU.  PT and OT eval  Assessment and Plan: Acute Bilateral Subdural Hematomas  s/p bilateral frontal burr hole washout. Drains removed 8/1. S/p plt and FFP.  No need for further supplementation. Staples removed on 8/8 by neurosurgery..  Sutures in place and the previous drain sites are dissolvable per neurosurgery Sodium 123 -> 136, continue salt tabs  MRI of the brain bilateral holohemispheric extra-axial collections measuring 9 mm, mass effect on both superior hemispheres. No intervention per Neuro surgery team. Discussed with Neurologist, EEG not showing any active seizure.   Repeat EEG today also showed no seizure.  Acute metabolic encephalopathy In the setting of subdural hematoma, possible seizure, hepatic encephalopathy, infection.  He has waxing and waning mental status On 8/5 Neuro was consulted for persistent somnolence.  EEG neg for seizure, on Keppra with improvement in mental status.  AMS, fever 8/5- Started treatment for hepatic encephalopathy and meningitis. Rectal tube inserted due to skin breakdown. Continue lactulose enema QID.  Repeat ammonia 63. Continue rifaximin therapy once he is safe to take oral Continue vancomycin for MRSE blood cultures.   Neurology following.  EEG negative for seizure. His repeat MRI 07/23/23 with mass effect on both sides may be the cause of his lethargy. Continue neurochecks.  Indwelling foley for urinary retention.   Fever, AMS MRSE bacteremia Since pt had a subdural drain that had drained spinal fluid, there is concern for noscomial meningitis. Neurosurgery, who deemed LP to carry too much risk. Decision made to start tx for meningitis empirically. Given MRSE bacteremia, ID recommended to stop cefepime, continue vancomycin for now   Acute urinary retention Foley reinsertion and care during the inpatient stay. Once his mental status improves will remove foley with voiding trial.   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 Continue lactulose, rifaximin therapy.  Continue to monitor neurochecks. Nursing supportive care.   OSA Not compliant with CPAP.   Hypothyroidism Continue home dose Synthroid   Type 2 Diabetes Mellitus Sliding scale insulin as per floor protocol   GERD  Continue PPI therapy.   Dyslipidemia Resume statin at discharge.      Subjective: Patient is awake and alert, complains of some headache.  Wanting to eat breakfast  Physical Exam: Vitals:   07/26/23  0516 07/26/23 0600 07/26/23 0731 07/26/23 1131  BP: 131/69  127/66 (!) 118/51  Pulse: 71  72 81  Resp: 18 14 14 14   Temp: 98 F (36.7 C)  98.1 F (36.7 C) 98.1 F (36.7 C)  TempSrc:      SpO2: 99%  97% 96%  Weight: 107.9 kg     Height:       General - Elderly Caucasian weak male, lying in the bed comfortably without any acute distress HEENT - PERRLA, EOMI, atraumatic head, non tender sinuses.  Has bandages on his head Lung - Clear, diffuse rales, rhonchi. Heart - S1, S2 heard, no murmurs, rubs, trace pedal edema Neuro -awake and alert, nonfocal Skin - Warm and dry. Data Reviewed:     Latest Ref Rng & Units 07/26/2023    6:36 AM 07/25/2023    3:19 AM 07/24/2023    4:22 AM  CBC  WBC 4.0 - 10.5 K/uL 3.1  3.6  4.4   Hemoglobin 13.0 - 17.0 g/dL 9.2  9.5  9.9   Hematocrit 39.0 - 52.0 % 25.8  26.2  26.7   Platelets 150 - 400 K/uL 50  48  52       Latest Ref Rng & Units 07/26/2023    6:36 AM 07/25/2023    3:19 AM 07/24/2023    4:22 AM  BMP  Glucose 70 - 99 mg/dL 161  096  045   BUN 8 - 23 mg/dL 10  14  18    Creatinine 0.61 - 1.24 mg/dL 4.09  8.11  9.14   Sodium 135 - 145 mmol/L 136  133  132   Potassium 3.5 - 5.1 mmol/L 3.5  3.6  3.8   Chloride 98 - 111 mmol/L 109  105  103   CO2 22 - 32 mmol/L 21  23  21    Calcium 8.9 - 10.3 mg/dL 8.0  8.0  8.3    EEG adult  Result Date: 07/25/2023 Joshua Quest, MD     07/25/2023  4:52 PM Patient Name: Joshua Berry MRN: [id number] Epilepsy Attending: Charlsie Berry Referring Physician/Provider: Milon Dikes, MD Date: 07/25/2023 Duration: 25.48 mins Patient history: 77yo M presented with altered mental status and was found to have 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 EEG to evaluate for seizure Level of alertness: Awake  AEDs during EEG study: LEV   Technical aspects: This EEG study was done with scalp electrodes positioned according to the 10-20 International system of electrode placement. Electrical activity was reviewed with band pass filter of 1-70Hz , sensitivity of 7 uV/mm, display speed of 49mm/sec with a 60Hz  notched filter applied as appropriate. EEG data were recorded continuously and digitally stored.  Video monitoring was available and reviewed as appropriate.  Description: EEG showed continuous generalized 3 to 6 Hz theta-delta slowing. Hyperventilation and photic stimulation were not performed.    Of note, EEG was technically difficult due to significant myogenic artifact.  ABNORMALITY - Continuous slow, generalized  IMPRESSION: This technically difficult  study is suggestive of moderate to severe diffuse encephalopathy, nonspecific etiology. No seizures or epileptiform discharges were seen throughout the recording.  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 Level of alertness: Awake AEDs during EEG study: LEV Technical aspects: This EEG study was done with scalp electrodes positioned according to the 10-20 International system of electrode placement. Electrical activity was reviewed with band pass filter of 1-70Hz , sensitivity of 7 uV/mm, display speed of 78mm/sec with a 60Hz  notched filter applied as appropriate. EEG data were recorded continuously and digitally stored.  Video monitoring was  available and reviewed as appropriate. Description: EEG showed continuous generalized 3 to 6 Hz theta-delta slowing. Hyperventilation and photic stimulation were not performed.   Of note, EEG was technically difficult due to significant myogenic artifact. ABNORMALITY - Continuous slow, generalized IMPRESSION: This technically difficult  study is suggestive of moderate to severe diffuse encephalopathy, nonspecific etiology. No seizures or epileptiform discharges were seen throughout the recording. Priyanka Annabelle Harman     Family Communication: None at bedside  Disposition: Status is: Inpatient: bacteremia work up, IV antibiotics.  Planned Discharge Destination: LTAC   Time spent 15 minutes MDM level 3- patient s/p burr hole, had fever, now with bacteremia, waxing and waning mental status. He need IV antibiotic, subspecialty follow up, nursing care, close hemodynamic, electrolyte, neurologic, telemetry monitoring. He is at high risk for clinical deterioration.  Author: Delfino Lovett, MD 07/26/2023 1:06 PM  For on call review [email or web address].

## 2023-07-27 DIAGNOSIS — K7682 Hepatic encephalopathy: Secondary | ICD-10-CM | POA: Diagnosis not present

## 2023-07-27 DIAGNOSIS — S065XAA Traumatic subdural hemorrhage with loss of consciousness status unknown, initial encounter: Secondary | ICD-10-CM | POA: Diagnosis not present

## 2023-07-27 DIAGNOSIS — K7460 Unspecified cirrhosis of liver: Secondary | ICD-10-CM | POA: Diagnosis not present

## 2023-07-27 LAB — BASIC METABOLIC PANEL WITH GFR
Anion gap: 5 (ref 5–15)
BUN: 12 mg/dL (ref 8–23)
CO2: 21 mmol/L — ABNORMAL LOW (ref 22–32)
Calcium: 7.7 mg/dL — ABNORMAL LOW (ref 8.9–10.3)
Chloride: 108 mmol/L (ref 98–111)
Creatinine, Ser: 0.7 mg/dL (ref 0.61–1.24)
GFR, Estimated: >60 mL/min (ref >60)
Glucose, Bld: 102 mg/dL — ABNORMAL HIGH (ref 70–99)
Potassium: 3.4 mmol/L — ABNORMAL LOW (ref 3.5–5.1)
Sodium: 134 mmol/L — ABNORMAL LOW (ref 135–145)

## 2023-07-27 LAB — CBC
HCT: 25.6 % — ABNORMAL LOW (ref 39.0–52.0)
Hemoglobin: 9 g/dL — ABNORMAL LOW (ref 13.0–17.0)
MCH: 32.5 pg (ref 26.0–34.0)
MCHC: 35.2 g/dL (ref 30.0–36.0)
MCV: 92.4 fL (ref 80.0–100.0)
Platelets: 54 10*3/uL — ABNORMAL LOW (ref 150–400)
RBC: 2.77 MIL/uL — ABNORMAL LOW (ref 4.22–5.81)
RDW: 15.2 % (ref 11.5–15.5)
WBC: 3.4 10*3/uL — ABNORMAL LOW (ref 4.0–10.5)
nRBC: 0 % (ref 0.0–0.2)

## 2023-07-27 LAB — AMMONIA: Ammonia: 114 umol/L — ABNORMAL HIGH (ref 9–35)

## 2023-07-27 LAB — GLUCOSE, CAPILLARY
Glucose-Capillary: 120 mg/dL — ABNORMAL HIGH (ref 70–99)
Glucose-Capillary: 171 mg/dL — ABNORMAL HIGH (ref 70–99)
Glucose-Capillary: 166 mg/dL — ABNORMAL HIGH (ref 70–99)
Glucose-Capillary: 165 mg/dL — ABNORMAL HIGH (ref 70–99)

## 2023-07-27 LAB — PHOSPHORUS: Phosphorus: 2.6 mg/dL (ref 2.5–4.6)

## 2023-07-27 LAB — MAGNESIUM: Magnesium: 1.7 mg/dL (ref 1.7–2.4)

## 2023-07-27 MED ORDER — POTASSIUM CHLORIDE CRYS ER 20 MEQ PO TBCR
40.0000 meq | EXTENDED_RELEASE_TABLET | Freq: Once | ORAL | Status: AC
  Administered 2023-07-27: 40 meq via ORAL
  Filled 2023-07-27: qty 2

## 2023-07-27 MED ORDER — MAGNESIUM SULFATE 2 GM/50ML IV SOLN
2.0000 g | Freq: Once | INTRAVENOUS | Status: AC
  Administered 2023-07-27: 2 g via INTRAVENOUS
  Filled 2023-07-27: qty 50

## 2023-07-27 MED ORDER — LACTULOSE 10 GM/15ML PO SOLN
30.0000 g | Freq: Three times a day (TID) | ORAL | Status: DC
  Administered 2023-07-27 – 2023-07-28 (×4): 30 g via ORAL
  Filled 2023-07-27 (×4): qty 60

## 2023-07-27 MED ORDER — LEVETIRACETAM IN NACL 500 MG/100ML IV SOLN
500.0000 mg | Freq: Two times a day (BID) | INTRAVENOUS | Status: DC
  Administered 2023-07-27 – 2023-07-29 (×4): 500 mg via INTRAVENOUS
  Filled 2023-07-27 (×5): qty 100

## 2023-07-27 MED ORDER — ACETAMINOPHEN 500 MG PO TABS
500.0000 mg | ORAL_TABLET | Freq: Four times a day (QID) | ORAL | Status: DC | PRN
  Administered 2023-07-27 – 2023-08-01 (×7): 500 mg via ORAL
  Filled 2023-07-27 (×8): qty 1

## 2023-07-27 NOTE — Consult Note (Addendum)
PHARMACY CONSULT NOTE - ELECTROLYTES  Pharmacy Consult for Electrolyte Monitoring and Replacement   Recent Labs: Height: 6\' 2"  (188 cm) Weight: 109.3 kg (240 lb 15.4 oz) IBW/kg (Calculated) : 82.2 Estimated Creatinine Clearance: 100.1 mL/min (by C-G formula based on SCr of 0.7 mg/dL). Potassium (mmol/L)  Date Value  07/27/2023 3.4 (L)  05/01/2014 4.2   Magnesium (mg/dL)  Date Value  38/75/6433 1.7   Calcium (mg/dL)  Date Value  29/51/8841 7.7 (L)   Calcium, Total (mg/dL)  Date Value  66/05/3015 9.4   Albumin (g/dL)  Date Value  12/24/3233 2.5 (L)  05/01/2014 4.2   Phosphorus (mg/dL)  Date Value  57/32/2025 2.6   Sodium (mmol/L)  Date Value  07/27/2023 134 (L)  05/01/2014 136   Corrected Ca: 8.9 mg/dL  Assessment  IHAN MCWHIRTER is a 77 y.o. male presenting with SDH. PMH significant for NASH with liver cirrhosis, portal HTN, pancytopenia, OSA, s/p TAVR. Pharmacy has been consulted to monitor and replace electrolytes.  Diet: dysphagia 1 MIVF: NS @ 75 mL/hr Pertinent medications: lactulose PO and enema, NaCl 1g PO BID  Goal of Therapy: Electrolytes WNL  Plan:  Continue PO Na supplementation as ordered Give KCl 40 mEq PO x2 as ordered Ca acceptable per corrected Ca Phos stable for several days, move to twice weekly checks rather than daily Check CMP, Mg with AM labs  Thank you for allowing pharmacy to be a part of this patient's care.  Celene Squibb, PharmD Clinical Pharmacist 07/27/2023 2:36 PM

## 2023-07-27 NOTE — Plan of Care (Signed)
  Problem: Coping: Goal: Level of anxiety will decrease Outcome: Progressing   Problem: Health Behavior/Discharge Planning: Goal: Ability to manage health-related needs will improve Outcome: Not Progressing   Problem: Activity: Goal: Risk for activity intolerance will decrease Outcome: Not Progressing   Problem: Nutrition: Goal: Adequate nutrition will be maintained Outcome: Not Progressing   Problem: Elimination: Goal: Will not experience complications related to bowel motility Outcome: Not Progressing Goal: Will not experience complications related to urinary retention Outcome: Not Progressing   Problem: Pain Managment: Goal: General experience of comfort will improve Outcome: Not Progressing   Problem: Skin Integrity: Goal: Risk for impaired skin integrity will decrease Outcome: Not Progressing   Problem: Coping: Goal: Ability to adjust to condition or change in health will improve Outcome: Not Progressing   Problem: Fluid Volume: Goal: Ability to maintain a balanced intake and output will improve Outcome: Not Progressing

## 2023-07-27 NOTE — Consult Note (Signed)
PHARMACY CONSULT NOTE - ELECTROLYTES  Pharmacy Consult for Electrolyte Monitoring and Replacement   Recent Labs: Potassium (mmol/L)  Date Value  07/27/2023 3.4 (L)  05/01/2014 4.2   Magnesium (mg/dL)  Date Value  40/98/1191 1.7   Calcium (mg/dL)  Date Value  47/82/9562 7.7 (L)   Calcium, Total (mg/dL)  Date Value  13/07/6577 9.4   Albumin (g/dL)  Date Value  46/96/2952 2.5 (L)  05/01/2014 4.2   Phosphorus (mg/dL)  Date Value  84/13/2440 2.6   Sodium (mmol/L)  Date Value  07/27/2023 134 (L)  05/01/2014 136   Height: 6\' 2"  (188 cm) Weight: 109.3 kg (240 lb 15.4 oz) IBW/kg (Calculated) : 82.2 Estimated Creatinine Clearance: 100.1 mL/min (by C-G formula based on SCr of 0.7 mg/dL).  Assessment  Joshua Berry is a 77 y.o. male presenting with acute bilateral subdural hematomas s/p bilateral frontal burr hole washout. Hospital course complicated by MRSE bacteremia and altered mental status. PMH significant for NASH with liver cirrhosis, portal HTN, TIPS, GIB/GAVE, pancytopenia, OSA, TAVR, multiple episodes of bacteremia requiring prolonged IV antibiotic therapy. Pharmacy has been consulted to monitor and replace electrolytes.  Diet: Dysphagia 2 MIVF: NS @ 75 mL/hr Pertinent medications: NaCl 1 g BIDM  Goal of Therapy: Electrolytes within normal limits  Plan:  Na 136>134, continue NaCl 1 g BIDM and fluids as above K 3.4, KCL PO x 1 Mg 1.7, will give magnesium sulfate 2 g IV x 1 Follow-up electrolytes with AM labs tomorrow  Thank you for allowing pharmacy to be a part of this patient's care.   A  07/27/2023 9:03 AM

## 2023-07-27 NOTE — Progress Notes (Addendum)
Neurology Progress Note   S:// Recalled by hospitalist-family concerned that patient has much worse level of wakefulness today than yesterday.  Said about medication side effects with Keppra   O:// Current vital signs: BP 121/61   Pulse 72   Temp 98.9 F (37.2 C) (Oral)   Resp 14   Ht 6\' 2"  (1.88 m)   Wt 109.3 kg   SpO2 97%   BMI 30.94 kg/m  Vital signs in last 24 hours: Temp:  [98 F (36.7 C)-98.9 F (37.2 C)] 98.9 F (37.2 C) (08/11 0834) Pulse Rate:  [64-81] 72 (08/11 0834) Resp:  [14-17] 14 (08/11 0900) BP: (103-123)/(49-74) 121/61 (08/11 0900) SpO2:  [96 %-100 %] 97 % (08/11 0834) Weight:  [109.3 kg] 109.3 kg (08/11 0446) GEN: Drowsy, in no acute distress HEENT: Wounds CDI CVS: Regular rhythm Abdomen nondistended nontender Neurological exam Drowsy, opens eyes to loud voice. Is able to tell me his name Unable to tell me where he is or what month it is Keep following asleep during the encounter Is able to mouth a few words with mild dysarthria Cranial nerves: Pupils equal round react light, extraocular movements intact, visual fields appear full, mouth and face appears symmetric Motor examination: Antigravity send in both upper extremities with mild drift which is equal bilaterally more related to his lack of attention concentration.  Bilateral lower extremities he was able to wiggle toes to command. Sensation: Grimaces to noxious stimulation and withdraws upper extremities pretty equally. Coordination difficult to assess given his mentation  Medications  Current Facility-Administered Medications:    0.9 %  sodium chloride infusion, , Intravenous, Continuous, Sreeram, Narendranath, MD, Last Rate: 75 mL/hr at 07/27/23 0800, Infusion Verify at 07/27/23 0800   ascorbic acid (VITAMIN C) tablet 500 mg, 500 mg, Oral, BID, Sreeram, Narendranath, MD, 500 mg at 07/27/23 0920   butalbital-acetaminophen-caffeine (FIORICET) 50-325-40 MG per tablet 2 tablet, 2 tablet, Oral, Q8H  PRN, [name], MD, 2 tablet at 07/27/23 0535   Chlorhexidine Gluconate Cloth 2 % PADS 6 each, 6 each, Topical, Daily, Raechel Chute, MD, 6 each at 07/26/23 0841   clotrimazole (LOTRIMIN) 1 % cream, , Topical, BID, [name], MD, Given at 07/27/23 0950   feeding supplement (ENSURE ENLIVE / ENSURE PLUS) liquid 237 mL, 237 mL, Oral, TID BM, Sreeram, Narendranath, MD, 237 mL at 07/27/23 0951   Gerhardt's butt cream, , Topical, TID, [name], MD, Given at 07/27/23 0951   hydrALAZINE (APRESOLINE) injection 10 mg, 10 mg, Intravenous, Q4H PRN, Paliwal, Aditya, MD, 10 mg at 07/23/23 1353   insulin aspart (novoLOG) injection 0-6 Units, 0-6 Units, Subcutaneous, TID WC, [name], MD, 1 Units at 07/26/23 1710   labetalol (NORMODYNE) injection 10 mg, 10 mg, Intravenous, Q2H PRN, Paliwal, Aditya, MD   lactulose (CHRONULAC) enema 200 gm, 300 mL, Rectal, BID, Sreeram, Narendranath, MD, 300 mL at 07/26/23 2259   levETIRAcetam (KEPPRA) IVPB 500 mg/100 mL premix, 500 mg, Intravenous, Q12H, Milon Dikes, MD   levothyroxine (SYNTHROID) tablet 100 mcg, 100 mcg, Oral, A2130, [name], MD, 100 mcg at 07/27/23 8657   magnesium sulfate IVPB 2 g 50 mL, 2 g, Intravenous, Once, Nazari, Walid A, RPH   multivitamin with minerals tablet 1 tablet, 1 tablet, Oral, Daily, [name], MD, 1 tablet at 07/27/23 0920   Oral care mouth rinse, 15 mL, Mouth Rinse, 4 times per day, Raechel Chute, MD, 15 mL at 07/27/23 0818   Oral care mouth rinse, 15 mL, Mouth Rinse, PRN, Dgayli, Khabib,  MD, 15 mL at 07/15/23 0400   pantoprazole (PROTONIX) EC tablet 40 mg, 40 mg, Oral, BID, [name], MD, 40 mg at 07/27/23 0820   polyethylene glycol (MIRALAX / GLYCOLAX) packet 17 g, 17 g, Oral, Daily PRN, Rust-Chester, Britton L, NP   polyvinyl alcohol (LIQUIFILM TEARS) 1.4 % ophthalmic solution 1 drop, 1 drop, Both Eyes, PRN, [name], MD, 1 drop at 07/19/23 0908   potassium chloride SA (KLOR-CON M) CR tablet 40 mEq, 40 mEq, Oral, Once, Nazari, Walid  A, RPH   rifaximin (XIFAXAN) tablet 550 mg, 550 mg, Oral, BID, [name], MD, 550 mg at 07/27/23 0920   sodium chloride tablet 1 g, 1 g, Oral, BID WC, Sreeram, Narendranath, MD, 1 g at 07/27/23 0820   terazosin (HYTRIN) capsule 5 mg, 5 mg, Oral, QHS, [name], MD, 5 mg at 07/26/23 2050   vancomycin (VANCOREADY) IVPB 1250 mg/250 mL, 1,250 mg, Intravenous, Q12H, Aleda Grana, RPH, Stopped at 07/27/23 0430  Labs CBC    Component Value Date/Time   WBC 3.4 (L) 07/27/2023 0523   RBC 2.77 (L) 07/27/2023 0523   HGB 9.0 (L) 07/27/2023 0523   HGB 12.3 (L) 05/01/2014 1553   HCT 25.6 (L) 07/27/2023 0523   HCT 36.6 (L) 05/01/2014 1553   PLT 54 (L) 07/27/2023 0523   PLT 72 (L) 05/01/2014 1553   MCV 92.4 07/27/2023 0523   MCV 96 05/01/2014 1553   MCH 32.5 07/27/2023 0523   MCHC 35.2 07/27/2023 0523   RDW 15.2 07/27/2023 0523   RDW 15.8 (H) 05/01/2014 1553   LYMPHSABS 0.6 (L) 07/15/2023 2139   MONOABS 0.6 07/15/2023 2139   EOSABS 0.0 07/15/2023 2139   BASOSABS 0.0 07/15/2023 2139   CMP     Component Value Date/Time   NA 134 (L) 07/27/2023 0523   NA 136 05/01/2014 1553   K 3.4 (L) 07/27/2023 0523   K 4.2 05/01/2014 1553   CL 108 07/27/2023 0523   CL 105 05/01/2014 1553   CO2 21 (L) 07/27/2023 0523   CO2 28 05/01/2014 1553   GLUCOSE 102 (H) 07/27/2023 0523   GLUCOSE 178 (H) 05/01/2014 1553   BUN 12 07/27/2023 0523   BUN 27 (H) 05/01/2014 1553   CREATININE 0.70 07/27/2023 0523   CREATININE 1.49 (H) 05/01/2014 1553   CALCIUM 7.7 (L) 07/27/2023 0523   CALCIUM 9.4 05/01/2014 1553   PROT 5.6 (L) 07/24/2023 1506   PROT 7.9 05/01/2014 1553   ALBUMIN 2.5 (L) 07/24/2023 1506   ALBUMIN 4.2 05/01/2014 1553   AST 55 (H) 07/24/2023 1506   AST 43 (H) 05/01/2014 1553   ALT 39 07/24/2023 1506   ALT 41 05/01/2014 1553   ALKPHOS 71 07/24/2023 1506   ALKPHOS 46 05/01/2014 1553   BILITOT 2.0 (H) 07/24/2023 1506   BILITOT 0.6 05/01/2014 1553   GFRNONAA >60 07/27/2023 0523   GFRNONAA 47  (L) 05/01/2014 1553   GFRAA >60 09/23/2019 1305   GFRAA 55 (L) 05/01/2014 1553   Lipid Panel     Component Value Date/Time   CHOL 145 04/06/2022 1804   TRIG 122 04/06/2022 1804   HDL 43 04/06/2022 1804   CHOLHDL 3.4 04/06/2022 1804   VLDL 24 04/06/2022 1804   LDLCALC 78 04/06/2022 1804    Lab Results  Component Value Date   HGBA1C 5.4 06/17/2023  Ammonia also elevated at 65 yesterday  Imaging I have reviewed images in epic and the results pertinent to this consultation are: Most recent one  of the brain from yesterday which was performed with and without contrast shows bilateral holohemispheric extra-axial collections measuring up to 9 mm on the right and 8 mm on the left.  There is mass effect on both superior hemispheres with no midline shift.  No specific features of infection were noted.  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  Routine EEG showed no epileptiform abnormality but suspicion remained high for possible nonconvulsive seizures.  ID has been consulted-MRSE on blood cultures.  Unable to do spinal tap.  MRI brain was completed which shows no evidence of infection intracranially but shows decent sized extra-axial collections bilaterally with mass effect on both superior hemispheres.  He has had waxing and waning phase where a couple of days he was pretty good, sitting in bed eating his meal and yesterday had more difficulty staying awake.  I suspect this is all due to fluctuating mental status from the  bilateral hemispheric subdural collections causing mass effect+hyperammoniemia.    EEGs have been unremarkable for any focality or evidence of seizures.  Family is concerned that Keppra may be contributing to his drowsiness.  Suspicion for nonconvulsive status epilepticus is low given the exam and EEG findings.  IMPRESSION Fluctuating mental status likely from bilateral hemispheric subdural collections causing mass effect plus hyperammonemia  Recommendations: Repeat EEG OK to continue Keppra for now but at a reduced dose-reducing from 1000 twice daily to 500 twice daily. Check Ammonia -management of hyperammonemia per primary team My plan is to watch him on the lower doses of Keppra to see if that improves his mentation.  If that does not improve his mentation, consider Provigil or amantadine in the mornings. Follow up with NSGY and Neurology outpatient   I will sign out his care to the oncoming neurologist for an exam in the coming day or 2 to see if reducing the Keppra has been beneficial and to make decisions on the amantadine and any further decisions on further reducing the dose of Keppra to 250 twice daily or even holding it if appropriate.   Plan was discussed in detail with the daughter at bedside, son-in-law over the phone FaceTime and Dr. Sherryll Burger and Burnett Harry RN  ADDENDUM Ammonia level resulted- 114 Hyperammonemia likely contributing in addition to the bilateral subdural collections. Correction of hyperammonemia per primary team. Will follow.   -- Milon Dikes, MD Neurologist [facility] Pager: [phone]

## 2023-07-27 NOTE — Progress Notes (Signed)
Progress Note   [name] OZH:[id number] DOB: 1946-07-07 DOA: 07/14/2023     13 DOS: the patient was seen and examined on 07/27/2023   Brief hospital course: RICE BINZ is a 77 yo M with hx of NASH with liver cirrhosis, portal HTN, TIPS, GIB/GAVE, pancytopenia, OSA, TAVR, left shoulder replacement, multiple episodes of bacteremia requiring prolonged IV abx treatment who presented to [facility] ED from [place] assisted living via EMS for evaluation of altered mental status.    ED workup revealed bilateral subdural hematomas with midline shift and signs of herniation. Family in agreement with surgical intervention. NSGY administered platelets and FFP to improve coagulopathy then brought patient to OR for hematoma evacuation.    Patient was in ICU managed by PCCM, then transferred to hospitalist service on 7/31. 07/21/23 he spiked fever, started on prophylactic meningitis treatment.  07/23/23 MRSE positive blood cultures. ID on board. Continue Vanco, stopped Cefepime. MRI brain rule out abscess. Infected hematoma ordered. 07/24/23 MRI of the brain bilateral holohemispheric extra-axial collections measuring 9 mm, mass effect on both superior hemispheres.  Neurosurgery team suggested MRI findings not concerning.  Advised cEEG, neurology asked for follow-up. 8/9: EEG negative for any acute seizure 8/10: Transfer to PCU.  PT and OT eval 8/11: Patient more drowsy, cut back dose of Keppra from 1000 to 500 mg twice daily, ammonia elevated so ordered p.o. lactulose (as rectal may not be working/staying inside to have effect)  Assessment and Plan: Acute Bilateral Subdural Hematomas  s/p bilateral frontal burr hole washout. Drains removed 8/1. S/p plt and FFP.  No need for further supplementation. Staples removed on 8/8 by neurosurgery..  Sutures in place and the previous drain sites are dissolvable per neurosurgery Sodium 123 -> 134, continue salt tabs  MRI of the brain bilateral holohemispheric  extra-axial collections measuring 9 mm, mass effect on both superior hemispheres. No intervention per Neuro surgery team. Discussed with Neurologist, EEG not showing any active seizure.   Acute metabolic encephalopathy In the setting of subdural hematoma, hepatic encephalopathy, infection.  He has waxing and waning mental status likely due to hyperammonemia and bilateral hemispheric subdural collections On 8/5 Neuro was consulted for persistent somnolence.  EEG neg for seizure, on Keppra at reduced dose now.  Reduced from 1000 mg to 500 mg twice daily AMS, fever 8/5- Started treatment for hepatic encephalopathy and meningitis. Rectal tube inserted due to skin breakdown. Continue lactulose enema QID.  Repeat ammonia 63-> 114.  Will try oral lactulose, continue rifaximin.  Concern is that patient may not be holding lactulose in in the rectum for 30 to 45 minutes to have actual effect of medicine Continue vancomycin for MRSE blood cultures.   Neurology following.  EEG negative for seizure. His repeat MRI 07/23/23 with mass effect on both sides may be the cause of his lethargy. Continue neurochecks.  Indwelling foley for urinary retention. 8/11: Patient much more drowsy today so ammonia checked which is elevated.  Lactulose ordered by mouth.  Cutting back Keppra dose to 500 mg twice daily.  Appreciate neurology reevaluation.  If his mentation does not improve neurology may consider Provigil or amitriptyline in the morning tomorrow. If he is not able to tolerate oral lactulose may need to consider NG tube/Dobbhoff tube after talking with his GI doctor at [facility] (family insist we call his GI before putting tube considering his esophageal varices for which they are concerned about rupture if inserting tube)   Fever, AMS MRSE bacteremia Since pt had  a subdural drain that had drained spinal fluid, there is concern for noscomial meningitis. Neurosurgery, who deemed LP to carry too much risk. Decision made  to start tx for meningitis empirically.  Continue IV vancomycin Given MRSE bacteremia, ID recommended to stop cefepime, continue vancomycin for now   Acute urinary retention Foley reinsertion and care during the inpatient stay. Once his mental status improves will remove foley with voiding trial.   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 Continue lactulose, rifaximin therapy.  Continue to monitor neurochecks. Nursing supportive care.   OSA Not compliant with CPAP.   Hypothyroidism Continue home dose Synthroid   Type 2 Diabetes Mellitus Sliding scale insulin as per floor protocol   GERD  Continue PPI therapy.   Dyslipidemia Resume statin at discharge.      Subjective: Patient is drowsy today  Physical Exam: Vitals:   07/27/23 1100 07/27/23 1200 07/27/23 1300 07/27/23 1400  BP: 123/65 118/62 (!) 111/58 112/62  Pulse:      Resp: 15 15 15 15   Temp:  98.9 F (37.2 C)    TempSrc:      SpO2:  98%    Weight:      Height:       General - Elderly Caucasian weak male, lying in the bed comfortably without any acute distress HEENT - non tender sinuses.  Has bandages on his head Lung - Clear, diffuse rales, rhonchi. Heart - S1, S2 heard, no murmurs, rubs, trace pedal edema Neuro -he is drowsy but open eyes to the verbal commands. nonfocal, falls back to sleep easily Skin - Warm and dry. Data Reviewed:     Latest Ref Rng & Units 07/27/2023    5:23 AM 07/26/2023    6:36 AM 07/25/2023    3:19 AM  CBC  WBC 4.0 - 10.5 K/uL 3.4  3.1  3.6   Hemoglobin 13.0 - 17.0 g/dL 9.0  9.2  9.5   Hematocrit 39.0 - 52.0 % 25.6  25.8  26.2   Platelets 150 - 400 K/uL 54  50  48       Latest Ref Rng & Units 07/27/2023    5:23 AM 07/26/2023    6:36 AM 07/25/2023    3:19 AM  BMP  Glucose 70 - 99 mg/dL 829  562  130   BUN 8 - 23 mg/dL 12  10  14    Creatinine  0.61 - 1.24 mg/dL 8.65  7.84  6.96   Sodium 135 - 145 mmol/L 134  136  133   Potassium 3.5 - 5.1 mmol/L 3.4  3.5  3.6   Chloride 98 - 111 mmol/L 108  109  105   CO2 22 - 32 mmol/L 21  21  23    Calcium 8.9 - 10.3 mg/dL 7.7  8.0  8.0    EEG adult  Result Date: 07/25/2023 Charlsie Quest, MD     07/25/2023  4:52 PM Patient Name: LIMMIE WHITEEAGLE MRN: [id number] Epilepsy Attending: Charlsie Quest Referring Physician/Provider: Milon Dikes, MD Date: 07/25/2023 Duration: 25.48 mins Patient history: 77yo M presented with altered mental status and was found to have 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 EEG to evaluate for seizure Level of  alertness: Awake  AEDs during EEG study: LEV  Technical aspects: This EEG study was done with scalp electrodes positioned according to the 10-20 International system of electrode placement. Electrical activity was reviewed with band pass filter of 1-70Hz , sensitivity of 7 uV/mm, display speed of 37mm/sec with a 60Hz  notched filter applied as appropriate. EEG data were recorded continuously and digitally stored.  Video monitoring was available and reviewed as appropriate.  Description: EEG showed continuous generalized 3 to 6 Hz theta-delta slowing. Hyperventilation and photic stimulation were not performed.    Of note, EEG was technically difficult due to significant myogenic artifact.  ABNORMALITY - Continuous slow, generalized  IMPRESSION: This technically difficult  study is suggestive of moderate to severe diffuse encephalopathy, nonspecific etiology. No seizures or epileptiform discharges were seen throughout the recording.  Charlsie Quest     Family Communication: Updated daughter-in-law, patient's wife at bedside  Disposition: Status is: Inpatient: bacteremia work up,  IV antibiotics.  Planned Discharge Destination: LTAC versus SNF   Time spent 35 minutes MDM level 3- patient s/p burr hole, had fever, now with bacteremia, waxing and waning mental status. He need IV antibiotic, subspecialty follow up, nursing care, close hemodynamic, electrolyte, neurologic, telemetry monitoring. He is at high risk for clinical deterioration.  Author: Delfino Lovett, MD 07/27/2023 2:16 PM  For on call review [email or web address].

## 2023-07-27 NOTE — Progress Notes (Signed)
Neurosurgery visit note Patient seen and examined.  No issues overnight.  This morning he is somewhat sleepy but easily interactive to conversation alert times name [facility] and 2024.  He was on 4 extremities gross full strength and sensation.  The incisions are well-healing.  AP: Overall the patient is doing extremely well from a postsurgical standpoint.  Will continue to follow.  Peter Garter. Madaline Brilliant, MD Neurosurgery

## 2023-07-28 ENCOUNTER — Inpatient Hospital Stay: Payer: No Typology Code available for payment source

## 2023-07-28 DIAGNOSIS — S065XAA Traumatic subdural hemorrhage with loss of consciousness status unknown, initial encounter: Secondary | ICD-10-CM | POA: Diagnosis not present

## 2023-07-28 LAB — GLUCOSE, CAPILLARY
Glucose-Capillary: 113 mg/dL — ABNORMAL HIGH (ref 70–99)
Glucose-Capillary: 178 mg/dL — ABNORMAL HIGH (ref 70–99)
Glucose-Capillary: 178 mg/dL — ABNORMAL HIGH (ref 70–99)
Glucose-Capillary: 169 mg/dL — ABNORMAL HIGH (ref 70–99)

## 2023-07-28 MED ORDER — LACTULOSE ENEMA
300.0000 mL | Freq: Two times a day (BID) | ORAL | Status: DC
  Filled 2023-07-28: qty 300

## 2023-07-28 MED ORDER — LACTULOSE 10 GM/15ML PO SOLN
30.0000 g | Freq: Two times a day (BID) | ORAL | Status: DC
  Administered 2023-07-28 – 2023-07-29 (×2): 30 g via ORAL
  Filled 2023-07-28 (×2): qty 60

## 2023-07-28 NOTE — Telephone Encounter (Signed)
Patient still admitted.

## 2023-07-28 NOTE — Progress Notes (Signed)
Physical Therapy Treatment Patient Details Name: Joshua Berry MRN: [id number] DOB: 08-05-1946 Today's Date: 07/28/2023   History of Present Illness Pt is a 77 year old male admitted with Acute Bilateral Subdural Hematomas  s/p bilateral frontal burr hole washout, acute metabolic encephalopathy;    PMH significant for liver cirrhosis, portal HTN, TIPS, GIB/GAVE, pancytopenia, OSA, TAVR, left shoulder replacement, multiple episodes of bacteremia requiring prolonged IV abx treatment    PT Comments  Pt seen for PT tx with daughter in law present for session. Pt requires total assist for supine<>sit & total assist for static sitting EOB. Pt requires max cuing to open eyes but quickly closes them again. Pt with poor balance & poor righting reactions. Pt assisted back to bed & left with all needs in reach.    If plan is discharge home, recommend the following: Two people to help with walking and/or transfers;Two people to help with bathing/dressing/bathroom;Assist for transportation;Direct supervision/assist for medications management;Assistance with feeding;Direct supervision/assist for financial management;Assistance with cooking/housework   Can travel by private vehicle     No  Equipment Recommendations  None recommended by PT    Recommendations for Other Services       Precautions / Restrictions Precautions Precautions: Fall Restrictions Weight Bearing Restrictions: No     Mobility  Bed Mobility Overal bed mobility: Needs Assistance Bed Mobility: Supine to Sit, Sit to Supine, Rolling Rolling: Total assist   Supine to sit: Total assist, HOB elevated Sit to supine: Total assist, +2 for physical assistance   General bed mobility comments: +2 total assist to scoot to [facility]    Transfers                        Ambulation/Gait                   Stairs             Wheelchair Mobility     Tilt Bed    Modified Rankin (Stroke Patients Only)        Balance Overall balance assessment: Needs assistance Sitting-balance support: Feet supported Sitting balance-Leahy Scale: Zero   Postural control: Right lateral lean                                  Cognition Arousal: Lethargic Behavior During Therapy: Flat affect                                   General Comments: Pt requires MAX cuing to open eyes but only does so briefly despite PT assisting pt to sit EOB, placing cold wet cloth on forehead.        Exercises      General Comments        Pertinent Vitals/Pain Pain Assessment Pain Assessment: Faces Faces Pain Scale: No hurt    Home Living                          Prior Function            PT Goals (current goals can now be found in the care plan section) Acute Rehab PT Goals Patient Stated Goal: reurn to a ALF PT Goal Formulation: With patient Time For Goal Achievement: 08/09/23 Potential to Achieve Goals: Fair Progress towards PT goals: PT to  reassess next treatment    Frequency    Min 1X/week      PT Plan      Co-evaluation              AM-PAC PT "6 Clicks" Mobility   Outcome Measure  Help needed turning from your back to your side while in a flat bed without using bedrails?: Total Help needed moving from lying on your back to sitting on the side of a flat bed without using bedrails?: Total Help needed moving to and from a bed to a chair (including a wheelchair)?: Total Help needed standing up from a chair using your arms (e.g., wheelchair or bedside chair)?: Total Help needed to walk in hospital room?: Total Help needed climbing 3-5 steps with a railing? : Total 6 Click Score: 6    End of Session   Activity Tolerance: Patient limited by lethargy Patient left: in bed;with call bell/phone within reach;with bed alarm set;with SCD's reapplied   PT Visit Diagnosis: Muscle weakness (generalized) (M62.81);Other abnormalities of gait and mobility  (R26.89);Difficulty in walking, not elsewhere classified (R26.2)     Time: 4098-1191 PT Time Calculation (min) (ACUTE ONLY): 16 min  Charges:    $Therapeutic Activity: 8-22 mins PT General Charges $$ ACUTE PT VISIT: 1 Visit                     Aleda Grana, PT, DPT 07/28/23, 4:08 PM   Sandi Mariscal 07/28/2023, 4:07 PM

## 2023-07-28 NOTE — Progress Notes (Signed)
Subjective: Continues to be confused.   Exam: Vitals:   07/28/23 0839 07/28/23 1151  BP: 130/65 (!) 122/57  Pulse: 75 63  Resp: 17 20  Temp: 98.8 F (37.1 C) 99.2 F (37.3 C)  SpO2: 96% 98%   Gen: In bed, NAD Resp: non-labored breathing, no acute distress Abd: soft, nt  Neuro: MS: Lethargic, but does awaken to stimulation, I am able to get him to follow commands and tell me his name and that he is in the hospital.  He does not answer other orientation questions. CN: Endorses seeing fingers wiggle in both hemifield's, crosses midline in both directions Motor: He will lift up his arms at the elbows, but will hold them against gravity, does not cooperate with lower extremity testing. Sensory: Response to noxious stimulation bilaterally  Pertinent Labs: Ammonia yesterday was 118 Sodium 134 Calcium 7.9 with an albumin of 2.3 Platelets 58  Impression: 77 year old male with persistent encephalopathy in the setting of bilateral subdural hematomas, cirrhosis secondary to [facility] with hyperammonemia, MRSE on blood cultures.  My suspicion is that his worsening is multifactorial secondary to his liver disease, bacteremia, in the setting of underlying CNS pathology (SDH).  Given we have not had any imaging in a few days, I do think repeating an CT scan would be reasonable.  As long as these appear stable, I am not sure I would attribute his encephalopathy to this given the other issues he has going on.  He did have his Keppra reduced yesterday, and Keppra can be sedating.  He has not had a definite seizure, but is on for prophylaxis.  Recommendations: 1) continue Keppra 500 twice daily 2) continue lactulose, ensure ammonia continues to trend down 3) repeat head CT 4) neurology will continue to follow  Ritta Slot, MD [facility] [phone]  If 7pm- 7am, please page neurology on call as listed in AMION.

## 2023-07-28 NOTE — Progress Notes (Signed)
Pt ate from all 3 meal trays today. Had 100 ml liquid stool out total for dayshift.

## 2023-07-28 NOTE — Progress Notes (Signed)
Pharmacy Antibiotic Note  Joshua Berry is a 77 y.o. male admitted on 07/14/2023 with meningitis.  Pharmacy has been consulted for vancomycin and cefepime dosing. Patient s/p elective neurosurgery bilateral burr holes on 7/29 for drainage of chronic SDH.  Bilateral ventriculostomy drains placed in OR and removed on 7/31.  Patient now having fever and altered mental status (PMH non-alcoholic cirrhosis with hepatic encephalopathy)  Today, 07/28/2023 Day #7 vancomycin  Tm/24h afebrile WBC 3.6 Renal: SCR WNL and at baseline- stable  Blood cultures: 4/4 Botltles, BCID = MRSE   (prev blood cx from 7/29 were no growth) Repeat Blood cultures from 8/8 are NGTD  Vancomycin levels: 8/9 vancomycin trough at 03:19 on 1250mg  IV q12h = 13 mcg/mL (prior to 5 maintenance dose), previous dose given at 16:36 8/8 8/13 vancomycin trough at  ___ on 1250mg  IV q12h = ___ mcg/mL. Previous dose given at ___  Plan: Continue Vancomycin 1250mg  IV q12 despite trough being little below goal. I anticipate vancomycin level will trend up a little more with subsequent dosing.  Hesitant to increase dose now based on patient's age and increase risk of kidney injury.  Will recheck level in 3-4 days.  Sooner if worsens from infection standpoint Goal vancomycin trough 15-71mcg/mL for CNS infection Estimated trough with this dose is 15.5 mcg/ml using an estimated CrCl of 68ml/min based on age.   Follow renal function and blood cultures Recheck vancomycin trough 8/13 prior to 4am dose   Height: 6\' 2"  (188 cm) Weight: 108.2 kg (238 lb 8.6 oz) IBW/kg (Calculated) : 82.2  Temp (24hrs), Avg:98.9 F (37.2 C), Min:98.3 F (36.8 C), Max:99.2 F (37.3 C)  Recent Labs  Lab 07/24/23 0422 07/25/23 0319 07/26/23 0636 07/27/23 0523 07/28/23 0448  WBC 4.4 3.6* 3.1* 3.4* 3.6*  CREATININE 0.73 0.70 0.67 0.70 0.64  VANCOTROUGH  --  13*  --   --   --     Estimated Creatinine Clearance: 99.7 mL/min (by C-G formula based on SCr of  0.64 mg/dL).    Allergies  Allergen Reactions   Camphor Swelling and Rash   Latex Rash   Lisinopril Cough    Other Reaction(s): Cough   Sulfamethoxazole Swelling   Chocolate     Family states he is allergic   Nsaids Other (See Comments)    NASH    Antimicrobials this admission: 8/6 vanco >>  8/6 cefepime >> 8/7  Dose adjustments this admission:   Microbiology results: 8/8 Bcx: NGTD 8/6 BCx: MRSE 7/29 BCx: NG-F 7/29 MRSA PCR: neg  Thank you for allowing pharmacy to be a part of this patient's care.  Juliette Alcide, PharmD, BCPS, BCIDP Work Cell: [phone] 07/28/2023 1:07 PM

## 2023-07-28 NOTE — Consult Note (Signed)
PHARMACY CONSULT NOTE - ELECTROLYTES  Pharmacy Consult for Electrolyte Monitoring and Replacement   Recent Labs: Height: 6\' 2"  (188 cm) Weight: 108.2 kg (238 lb 8.6 oz) IBW/kg (Calculated) : 82.2 Estimated Creatinine Clearance: 99.7 mL/min (by C-G formula based on SCr of 0.64 mg/dL). Potassium (mmol/L)  Date Value  07/28/2023 3.7  05/01/2014 4.2   Magnesium (mg/dL)  Date Value  62/13/0865 1.7   Calcium (mg/dL)  Date Value  78/46/9629 7.9 (L)   Calcium, Total (mg/dL)  Date Value  52/84/1324 9.4   Albumin (g/dL)  Date Value  40/09/2724 2.3 (L)  05/01/2014 4.2   Phosphorus (mg/dL)  Date Value  36/64/4034 2.7   Sodium (mmol/L)  Date Value  07/28/2023 134 (L)  05/01/2014 136   Corrected Ca: 8.9 mg/dL  Assessment  Joshua Berry is a 77 y.o. male presenting with SDH. PMH significant for NASH with liver cirrhosis, portal HTN, pancytopenia, OSA, s/p TAVR. Pharmacy has been consulted to monitor and replace electrolytes.  Na 134 low and stable, Mg 1.7 this AM needs replacement. All other electrolytes within normal limits.  Diet: dysphagia 1 MIVF: NS @ 75 mL/hr Pertinent medications: lactulose PO and enema, NaCl 1g PO BID  Goal of Therapy: Electrolytes WNL  Plan:  Continue PO Na supplementation as ordered Replace Mg with MgSulfate 2gm IV x 1 Ca acceptable per corrected Ca Phos check twice weekly (Mo & Th) Check CMP, Mg with AM labs  Thank you for allowing pharmacy to be a part of this patient's care.   Rodriguez-Guzman PharmD, BCPS 07/28/2023 7:40 AM

## 2023-07-28 NOTE — Progress Notes (Signed)
Date of Admission:  07/14/2023      ID: Joshua Berry is a 77 y.o. male  Principal Problem:   Subdural hematoma (HCC) Active Problems:   Cirrhosis of liver without ascites (HCC)   Compression of brain (HCC)   Uncal herniation (HCC)   Acute nontraumatic intracranial subdural hematoma (HCC)   Staphylococcus epidermidis bacteremia    Subjective: Lethargic Non verbal  Medications:   vitamin C  500 mg Oral BID   Chlorhexidine Gluconate Cloth  6 each Topical Daily   clotrimazole   Topical BID   feeding supplement  237 mL Oral TID BM   Gerhardt's butt cream   Topical TID   insulin aspart  0-6 Units Subcutaneous TID WC   lactulose  30 g Oral BID   Or   lactulose  300 mL Rectal BID   levothyroxine  100 mcg Oral Q0600   multivitamin with minerals  1 tablet Oral Daily   mouth rinse  15 mL Mouth Rinse 4 times per day   pantoprazole  40 mg Oral BID   rifaximin  550 mg Oral BID   sodium chloride  1 g Oral BID WC   terazosin  5 mg Oral QHS    Objective: Vital signs in last 24 hours: Patient Vitals for the past 24 hrs:  BP Temp Temp src Pulse Resp SpO2 Weight  07/28/23 1151 (!) 122/57 99.2 F (37.3 C) -- 63 20 98 % --  07/28/23 0839 130/65 98.8 F (37.1 C) Oral 75 17 96 % --  07/28/23 0500 -- -- -- -- -- -- 108.2 kg  07/28/23 0458 (!) 147/63 98.9 F (37.2 C) Oral 73 -- 97 % --  07/27/23 2200 127/63 98.3 F (36.8 C) Axillary -- -- 99 % --  07/27/23 1919 -- 99.2 F (37.3 C) Oral 74 -- 99 % --  07/27/23 1600 (!) 149/69 -- -- -- 15 -- --  07/27/23 1500 119/62 -- -- -- 16 -- --  07/27/23 1400 112/62 -- -- -- 15 -- --      PHYSICAL EXAM:  General: lethargic- opens eyes to calling his name Non verbal afebrile Head:scalp - dressing not removed Lungs: Clear to auscultation bilaterally. No Wheezing or Rhonchi. No rales. Heart: s1s2 Abdomen: Soft, non-tender,not distended. Bowel sounds normal. No masses Extremities: atraumatic, no cyanosis. No edema. No clubbing Skin: No  rashes or lesions. Or bruising Lymph: Cervical, supraclavicular normal. Neurologic: moves all limbs  Lab Results    Latest Ref Rng & Units 07/28/2023    4:48 AM 07/27/2023    5:23 AM 07/26/2023    6:36 AM  CBC  WBC 4.0 - 10.5 K/uL 3.6  3.4  3.1   Hemoglobin 13.0 - 17.0 g/dL 9.5  9.0  9.2   Hematocrit 39.0 - 52.0 % 27.3  25.6  25.8   Platelets 150 - 400 K/uL 58  54  50        Latest Ref Rng & Units 07/28/2023    4:48 AM 07/27/2023    5:23 AM 07/26/2023    6:36 AM  CMP  Glucose 70 - 99 mg/dL 782  956  213   BUN 8 - 23 mg/dL 11  12  10    Creatinine 0.61 - 1.24 mg/dL 0.86  5.78  4.69   Sodium 135 - 145 mmol/L 134  134  136   Potassium 3.5 - 5.1 mmol/L 3.7  3.4  3.5   Chloride 98 - 111 mmol/L 107  108  109   CO2 22 - 32 mmol/L 21  21  21    Calcium 8.9 - 10.3 mg/dL 7.9  7.7  8.0   Total Protein 6.5 - 8.1 g/dL 5.3     Total Bilirubin 0.3 - 1.2 mg/dL 1.8     Alkaline Phos 38 - 126 U/L 79     AST 15 - 41 U/L 52     ALT 0 - 44 U/L 42         Microbiology: Bc- staph epidermidis X 4 Studies/Results: No results found.   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

## 2023-07-28 NOTE — TOC Progression Note (Signed)
Transition of Care [facility]) - Progression Note    Patient Details  Name: Joshua Berry MRN: [id number] Date of Birth: March 23, 1946  [facility]) CM/SW Contact  Truddie Hidden, RN Phone Number: 07/28/2023, 3:41 PM  Clinical Narrative:    Attempt to contact Carollee Herter and Claudean Severance from [facility] for update on admission acceptance. Left a message with assigned TOC for tomorrow.      Barriers to Discharge: Continued Medical Work up  Expected Discharge Plan and Services       Living arrangements for the past 2 months: Assisted Living Facility                                       Social Determinants of Health (SDOH) Interventions SDOH Screenings   Food Insecurity: No Food Insecurity (07/18/2023)  Housing: Low Risk  (07/18/2023)  Transportation Needs: No Transportation Needs (07/18/2023)  Recent Concern: Transportation Needs - Unmet Transportation Needs (06/17/2023)  Utilities: Not At Risk (07/18/2023)  Recent Concern: Utilities - At Risk (06/17/2023)  Depression (PHQ2-9): Low Risk  (04/24/2023)  Social Connections: Unknown (04/29/2022)   Received from [facility]  Tobacco Use: Medium Risk (07/14/2023)    Readmission Risk Interventions    07/15/2023    8:52 AM 06/18/2023   10:19 AM 05/17/2022    1:45 PM  Readmission Risk Prevention Plan  Transportation Screening Complete Complete Complete  PCP or Specialist Appt within 5-7 Days  Complete Complete  PCP or Specialist Appt within 3-5 Days Complete    Home Care Screening  Complete Complete  Medication Review (RN CM)  Complete Complete  Social Work Consult for Recovery Care Planning/Counseling Complete    Palliative Care Screening Not Applicable    Medication Review Oceanographer) Referral to Pharmacy

## 2023-07-28 NOTE — Progress Notes (Signed)
[facility] Progress Note  [name]    ZOX:[id number]  DOA: 07/14/2023     Date of Service: the patient was seen and examined on 07/28/2023  Chief Complaint  Patient presents with   Code Sepsis   Brief hospital course: Joshua Berry is a 77 yo M with hx of NASH with liver cirrhosis, portal HTN, TIPS, GIB/GAVE, pancytopenia, OSA, TAVR, left shoulder replacement, multiple episodes of bacteremia requiring prolonged IV abx treatment who presented to [facility] ED from [facility] living via EMS for evaluation of altered mental status.    ED workup revealed bilateral subdural hematomas with midline shift and signs of herniation. Family in agreement with surgical intervention. NSGY administered platelets and FFP to improve coagulopathy then brought patient to OR for hematoma evacuation.    Patient was in ICU managed by PCCM, then transferred to hospitalist service on 7/31. 07/21/23 he spiked fever, started on prophylactic meningitis treatment.  07/23/23 MRSE positive blood cultures. ID on board. Continue Vanco, stopped Cefepime. MRI brain rule out abscess. Infected hematoma ordered. 07/24/23 MRI of the brain bilateral holohemispheric extra-axial collections measuring 9 mm, mass effect on both superior hemispheres.  Neurosurgery team suggested MRI findings not concerning.  Advised cEEG, neurology asked for follow-up. 8/9: EEG negative for any acute seizure 8/10: Transfer to PCU.  PT and OT eval 8/11: Patient more drowsy, cut back dose of Keppra from 1000 to 500 mg twice daily, ammonia elevated so ordered p.o. lactulose (as rectal may not be working/staying inside to have effect)  Assessment and Plan: # Acute Bilateral Subdural Hematomas  s/p bilateral frontal burr hole washout. Drains removed on 8/1. S/p plt and FFP.  No need for further supplementation. Staples removed on 8/8 by neurosurgery..  Sutures in place and the previous drain sites are dissolvable per neurosurgery Sodium  123 -> 134, continue salt tabs  MRI of the brain bilateral holohemispheric extra-axial collections measuring 9 mm, mass effect on both superior hemispheres. No intervention per Neuro surgery team. Discussed with Neurologist, EEG not showing any active seizure.    Acute metabolic encephalopathy In the setting of subdural hematoma, hepatic encephalopathy, infection.  He has waxing and waning mental status likely due to hyperammonemia and bilateral hemispheric subdural collections On 8/5 Neuro was consulted for persistent somnolence.  EEG neg for seizure, on Keppra at reduced dose now.  Reduced from 1000 mg to 500 mg twice daily AMS, fever 8/5- Started treatment for hepatic encephalopathy and meningitis. Rectal tube inserted due to skin breakdown. Continue lactulose enema QID.  Repeat ammonia 63-> 114.  Will try oral lactulose, continue rifaximin.  Concern is that patient may not be holding lactulose in in the rectum for 30 to 45 minutes to have actual effect of medicine Continue vancomycin for MRSE blood cultures.   Neurology following.  EEG negative for seizure. His repeat MRI 07/23/23 with mass effect on both sides may be the cause of his lethargy. Continue neurochecks.  Indwelling foley for urinary retention. 8/11: Patient much more drowsy today so ammonia checked which is elevated.  Lactulose ordered by mouth.  Cutting back Keppra dose to 500 mg twice daily.   Appreciate neurology reevaluation.   If his mentation does not improve neurology may consider Provigil or amitriptyline  If he is not able to tolerate oral lactulose may need to consider NG tube/Dobbhoff tube after talking with his GI doctor at [facility] (family insist we call his GI before putting tube considering his esophageal varices for which  they are concerned about rupture if inserting tube)   Fever, AMS MRSE bacteremia Since pt had a subdural drain that had drained spinal fluid, there is concern for noscomial meningitis.  Neurosurgery, who deemed LP to carry too much risk. Decision made to start tx for meningitis empirically.  Continue IV vancomycin Given MRSE bacteremia, ID recommended to stop cefepime, continue vancomycin for now   Acute urinary retention Foley reinsertion and care during the inpatient stay. Once his mental status improves will remove foley with voiding trial.   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 Continue lactulose, rifaximin therapy.  Continue to monitor neurochecks. Nursing supportive care.   OSA Not compliant with CPAP.   Hypothyroidism Continue home dose Synthroid   Type 2 Diabetes Mellitus Sliding scale insulin as per floor protocol   GERD  Continue PPI therapy.   Dyslipidemia Resume statin at discharge.   Body mass index is 30.63 kg/m.  Nutrition Problem: Inadequate oral intake Etiology: acute illness Interventions:    Pressure Injury 06/17/23 Coccyx Mid Stage 1 -  Intact skin with non-blanchable redness of a localized area usually over a bony prominence. (Active)  06/17/23 0615  Location: Coccyx  Location Orientation: Mid  Staging: Stage 1 -  Intact skin with non-blanchable redness of a localized area usually over a bony prominence.  Wound Description (Comments):   Present on Admission:      Pressure Injury 07/18/23 Buttocks Left;Right Stage 1 -  Intact skin with non-blanchable redness of a localized area usually over a bony prominence. (Active)  07/18/23 1450  Location: Buttocks  Location Orientation: Left;Right  Staging: Stage 1 -  Intact skin with non-blanchable redness of a localized area usually over a bony prominence.  Wound Description (Comments):   Present on Admission: Yes  Dressing Type Foam - Lift dressing to assess site every shift 07/28/23 0800     Diet: Dysphagia 1 diet DVT Prophylaxis: SCD,  pharmacological prophylaxis contraindicated due to thrombocytopenia    Advance goals of care discussion: DNR  Family Communication: family was present at bedside, at the time of interview.  The pt provided permission to discuss medical plan with the family. Opportunity was given to ask question and all questions were answered satisfactorily.   Disposition:  Pt is from ALF, admitted with subdural hematoma, NASH liver cirrhosis, hyperammonemia,, still has AMS, which precludes a safe discharge. Discharge to SNF, when clinically stable, may need few more days to become stable.  Subjective: No significant events overnight, in the morning time patient was sleepy, AO x 1, very slow to respond and follow commands.  Patient was unable to offer any complaints, seems to be resting comfortably. Patient's family was at bedside and over the FaceTime, management plan discussed and all question and concerns answered.   Physical Exam: General: NAD, lying comfortably, sleepy Appear in no distress Eyes: PERRLA ENT: Oral Mucosa Clear, moist  Neck: no JVD,  Cardiovascular: S1 and S2 Present, no Murmur,  Respiratory: good respiratory effort, Bilateral Air entry equal and Decreased, no Crackles, no wheezes Abdomen: Bowel Sound present, Soft and no tenderness,  Skin: no rashes Extremities: no Pedal edema, no calf tenderness Neurologic: Sleepy AO x 1, unable to follow commands  Gait not checked due to patient safety concerns  Vitals:   07/28/23 0458 07/28/23 0500 07/28/23 0839 07/28/23 1151  BP: (!) 147/63  130/65 (!) 122/57  Pulse: 73  75 63  Resp:   17 20  Temp: 98.9 F (37.2 C)  98.8 F (37.1 C) 99.2 F (37.3 C)  TempSrc: Oral  Oral   SpO2: 97%  96% 98%  Weight:  108.2 kg    Height:        Intake/Output Summary (Last 24 hours) at 07/28/2023 1353 Last data filed at 07/28/2023 0559 Gross per 24 hour  Intake 632.93 ml  Output 1835 ml  Net -1202.07 ml   Filed Weights   07/26/23 0516 07/27/23  0446 07/28/23 0500  Weight: 107.9 kg 109.3 kg 108.2 kg    Data Reviewed: I have personally reviewed and interpreted daily labs, tele strips, imagings as discussed above. I reviewed all nursing notes, pharmacy notes, vitals, pertinent old records I have discussed plan of care as described above with RN and patient/family.  CBC: Recent Labs  Lab 07/24/23 0422 07/25/23 0319 07/26/23 0636 07/27/23 0523 07/28/23 0448  WBC 4.4 3.6* 3.1* 3.4* 3.6*  HGB 9.9* 9.5* 9.2* 9.0* 9.5*  HCT 26.7* 26.2* 25.8* 25.6* 27.3*  MCV 90.2 91.6 92.1 92.4 94.8  PLT 52* 48* 50* 54* 58*   Basic Metabolic Panel: Recent Labs  Lab 07/23/23 0356 07/24/23 0422 07/25/23 0319 07/26/23 0636 07/27/23 0523 07/28/23 0448  NA 132* 132* 133* 136 134* 134*  K 3.9 3.8 3.6 3.5 3.4* 3.7  CL 102 103 105 109 108 107  CO2 22 21* 23 21* 21* 21*  GLUCOSE 117* 150* 126* 106* 102* 105*  BUN 15 18 14 10 12 11   CREATININE 0.72 0.73 0.70 0.67 0.70 0.64  CALCIUM 8.2* 8.3* 8.0* 8.0* 7.7* 7.9*  MG 1.7 2.0 1.6* 1.6* 1.7  --   PHOS 3.0 2.6 2.7 2.6 2.6 2.7    Studies: No results found.  Scheduled Meds:  vitamin C  500 mg Oral BID   Chlorhexidine Gluconate Cloth  6 each Topical Daily   clotrimazole   Topical BID   feeding supplement  237 mL Oral TID BM   Gerhardt's butt cream   Topical TID   insulin aspart  0-6 Units Subcutaneous TID WC   lactulose  30 g Oral BID   Or   lactulose  300 mL Rectal BID   levothyroxine  100 mcg Oral Q0600   multivitamin with minerals  1 tablet Oral Daily   mouth rinse  15 mL Mouth Rinse 4 times per day   pantoprazole  40 mg Oral BID   rifaximin  550 mg Oral BID   sodium chloride  1 g Oral BID WC   terazosin  5 mg Oral QHS   Continuous Infusions:  sodium chloride 75 mL/hr at 07/28/23 0952   levETIRAcetam 500 mg (07/28/23 0932)   vancomycin 1,250 mg (07/28/23 0205)   PRN Meds: acetaminophen, butalbital-acetaminophen-caffeine, hydrALAZINE, labetalol, mouth rinse, polyethylene glycol,  polyvinyl alcohol  Time spent: 35 minutes  Author: Gillis Santa. MD [facility] 07/28/2023 1:53 PM  To reach On-call, see care teams to locate the attending and reach out to them via [email or web address]. If 7PM-7AM, please contact night-coverage If you still have difficulty reaching the attending provider, please page the [facility] (Director on Call) for [facility] on amion for assistance.

## 2023-07-29 ENCOUNTER — Encounter: Payer: No Typology Code available for payment source | Admitting: Orthopedic Surgery

## 2023-07-29 DIAGNOSIS — R7881 Bacteremia: Secondary | ICD-10-CM | POA: Diagnosis not present

## 2023-07-29 DIAGNOSIS — Z515 Encounter for palliative care: Secondary | ICD-10-CM | POA: Diagnosis not present

## 2023-07-29 DIAGNOSIS — R400 Somnolence: Secondary | ICD-10-CM | POA: Diagnosis not present

## 2023-07-29 DIAGNOSIS — S065XAA Traumatic subdural hemorrhage with loss of consciousness status unknown, initial encounter: Secondary | ICD-10-CM | POA: Diagnosis not present

## 2023-07-29 DIAGNOSIS — Z9889 Other specified postprocedural states: Secondary | ICD-10-CM

## 2023-07-29 LAB — AMMONIA: Ammonia: 58 umol/L — ABNORMAL HIGH (ref 9–35)

## 2023-07-29 LAB — GLUCOSE, CAPILLARY
Glucose-Capillary: 101 mg/dL — ABNORMAL HIGH (ref 70–99)
Glucose-Capillary: 183 mg/dL — ABNORMAL HIGH (ref 70–99)
Glucose-Capillary: 168 mg/dL — ABNORMAL HIGH (ref 70–99)
Glucose-Capillary: 170 mg/dL — ABNORMAL HIGH (ref 70–99)

## 2023-07-29 LAB — TSH: TSH: 4.237 u[IU]/mL (ref 0.350–4.500)

## 2023-07-29 LAB — VITAMIN D 25 HYDROXY (VIT D DEFICIENCY, FRACTURES): Vit D, 25-Hydroxy: 46.02 ng/mL (ref 30–100)

## 2023-07-29 MED ORDER — LACTULOSE 10 GM/15ML PO SOLN
30.0000 g | Freq: Three times a day (TID) | ORAL | Status: DC
  Administered 2023-07-29 – 2023-08-01 (×6): 30 g via ORAL
  Filled 2023-07-29 (×8): qty 60

## 2023-07-29 MED ORDER — BISACODYL 5 MG PO TBEC
10.0000 mg | DELAYED_RELEASE_TABLET | Freq: Once | ORAL | Status: AC
  Administered 2023-07-29: 10 mg via ORAL
  Filled 2023-07-29: qty 2

## 2023-07-29 MED ORDER — VANCOMYCIN HCL 1500 MG/300ML IV SOLN
1500.0000 mg | Freq: Two times a day (BID) | INTRAVENOUS | Status: DC
  Administered 2023-07-30 – 2023-08-01 (×5): 1500 mg via INTRAVENOUS
  Filled 2023-07-29 (×6): qty 300

## 2023-07-29 MED ORDER — SODIUM CHLORIDE 0.9 % IV SOLN
250.0000 mg | Freq: Two times a day (BID) | INTRAVENOUS | Status: DC
  Administered 2023-07-29 – 2023-07-31 (×4): 250 mg via INTRAVENOUS
  Filled 2023-07-29 (×5): qty 2.5

## 2023-07-29 MED ORDER — LACTULOSE ENEMA
300.0000 mL | Freq: Three times a day (TID) | ORAL | Status: DC
  Administered 2023-07-30 – 2023-07-31 (×2): 300 mL via RECTAL
  Filled 2023-07-29 (×2): qty 300

## 2023-07-29 MED ORDER — MAGNESIUM SULFATE 2 GM/50ML IV SOLN
2.0000 g | Freq: Once | INTRAVENOUS | Status: AC
  Administered 2023-07-29: 2 g via INTRAVENOUS
  Filled 2023-07-29: qty 50

## 2023-07-29 NOTE — Progress Notes (Signed)
Occupational Therapy Treatment Patient Details Name: Joshua Berry MRN: [id number] DOB: 16-Oct-1946 Today's Date: 07/29/2023   History of present illness Pt is a 77 year old male admitted with Acute Bilateral Subdural Hematomas  s/p bilateral frontal burr hole washout, acute metabolic encephalopathy;    PMH significant for liver cirrhosis, portal HTN, TIPS, GIB/GAVE, pancytopenia, OSA, TAVR, left shoulder replacement, multiple episodes of bacteremia requiring prolonged IV abx treatment   OT comments  Pt seen for OT treatment on this date. Upon arrival to room, pt's wife in room. PT and OT initially attempt to co-tx due to pt's CLOF, pt with minimal alertness for participation in mobilization. OT facilitates ADL mgmt as below. Pt requires MAX A to attempt lifting BUE and encouragement from spouse to awaken to his name. Overall, pt lethargic and little participation given throughout session. Pt making slow progress toward goals, will continue to follow POC. Discharge recommendation remains appropriate.  Pt left in bed, NP in room with spouse, needs within reach and all lines/leads intact.       If plan is discharge home, recommend the following:  Two people to help with walking and/or transfers;Two people to help with bathing/dressing/bathroom;Help with stairs or ramp for entrance   Equipment Recommendations  Other (comment)    Recommendations for Other Services      Precautions / Restrictions Precautions Precautions: Fall Restrictions Weight Bearing Restrictions: No       Mobility Bed Mobility                    Transfers                   General transfer comment: unsafe to mobilize at this time         ADL either performed or assessed with clinical judgement   ADL Overall ADL's : Needs assistance/impaired     Grooming: Wash/dry face;Bed level;Maximal assistance Grooming Details (indicate cue type and reason): washes face with max-total assist from  therapist; pt able to grasp washcloth and lift arms briefly                               General ADL Comments:  (Difficulty with arousal levels to follow commands and participate, +2 for any seated tasks. MAX)    Extremity/Trunk Assessment Upper Extremity Assessment Upper Extremity Assessment: Difficult to assess due to impaired cognition;Generalized weakness   Lower Extremity Assessment Lower Extremity Assessment: Generalized weakness;Defer to PT evaluation         Cognition Arousal: Lethargic Behavior During Therapy: Flat affect Overall Cognitive Status: Impaired/Different from baseline Area of Impairment: Orientation, Following commands, Safety/judgement, Awareness, Problem solving                 Orientation Level: Disoriented to, Time, Situation Current Attention Level: Focused Memory: Decreased short-term memory Following Commands: Follows one step commands inconsistently Safety/Judgement: Decreased awareness of safety, Decreased awareness of deficits Awareness: Intellectual Problem Solving: Slow processing, Decreased initiation, Difficulty sequencing, Requires verbal cues, Requires tactile cues General Comments: overall MAX multimodal cuing to open eyes, does do briefly with encourgement from spouse, verbalizes goodbye as therapsit leaves and respondes inconsisently to his name        Exercises Exercises: General Upper Extremity (OT attempts AROM of BUE, minimal assist from pt, PROM of BUE completed for ADL participation in shld flexion x 10, elbow flexion to reach nose x 10, OT facilitates washing face with  washcloth)            Pertinent Vitals/ Pain       Pain Assessment Pain Assessment: No/denies pain   Frequency  Min 1X/week        Progress Toward Goals  OT Goals(current goals can now be found in the care plan section)     Acute Rehab OT Goals OT Goal Formulation: Patient unable to participate in goal setting Time For Goal  Achievement: 08/09/23 Potential to Achieve Goals: Fair  Plan Discharge plan remains appropriate;Frequency remains appropriate    Co-evaluation    PT/OT/SLP Co-Evaluation/Treatment: Yes Reason for Co-Treatment: For patient/therapist safety;To address functional/ADL transfers;Necessary to address cognition/behavior during functional activity (initally attempted co-tx, pt not safe to mobilize so OT continues tx without PT) PT goals addressed during session: Mobility/safety with mobility;Balance OT goals addressed during session: ADL's and self-care      AM-PAC OT "6 Clicks" Daily Activity     Outcome Measure   Help from another person eating meals?: A Lot Help from another person taking care of personal grooming?: A Lot Help from another person toileting, which includes using toliet, bedpan, or urinal?: Total Help from another person bathing (including washing, rinsing, drying)?: Total Help from another person to put on and taking off regular upper body clothing?: A Lot Help from another person to put on and taking off regular lower body clothing?: A Lot 6 Click Score: 10    End of Session    OT Visit Diagnosis: Other abnormalities of gait and mobility (R26.89);Muscle weakness (generalized) (M62.81)   Activity Tolerance Patient limited by lethargy   Patient Left in bed;with call bell/phone within reach;with bed alarm set;with family/visitor present           Time: 9629-5284 OT Time Calculation (min): 13 min  Charges: OT General Charges $OT Visit: 1 Visit OT Treatments $Self Care/Home Management : 8-22 mins   L. , OTR/L  07/29/23, 1:42 PM

## 2023-07-29 NOTE — Ethics Note (Signed)
Ethics Committee Consult  Date: 07/29/2023 Time: 4:16 PM  Primary Committee Member:  Allayne Gitelman Secondary Committee Member:  N/A  Does the patient have decision making capacity?  No - lacks capacity at this time  Does the patient have an Scientist, water quality or Health Care Power of Attorney?  No  Legal Decision Maker:  spouse Relationship to Patient:  Spouse Mitigating Factors:  No  Name and contact information for person who requested the consult:  Darolyn Rua - SW - [phone]  Attending Physician:  Gillis Santa, MD Was attending physician notified?  Yes  Other individuals involved, present/attending, their names, relationship/role to the patient:  N/A  Individuals involved but unable to attend/participate their names, relationship/role to the patient:  N/A  Reason for the consult / ethical problem (eg. Goals of care, values clarification, forum for discussion, conflict resolution, autonomy, beneficence, etc.):  Clarification of healthcare decision maker  Preliminary Information:  See in previous note  Objective facts related to or contributing to the problem (eg.  Pertinent medical history, facts of the case):  See previous note  Summary of consultation and recommendations:  This note is to clarify initial ethics consultation call from SW. The patient's son and wife are not in disagreement with one another about the care plan. They were concerned about the patient's readiness to be discharged at this time. The son is listed as primary in chart because he has been caring for his father and may be more accessible if needed. Clarification has been provided on healthcare decision maker. No further ethical concerns at this time.

## 2023-07-29 NOTE — Progress Notes (Signed)
Subjective: May be slightly more awake today  Exam: Vitals:   07/29/23 0900 07/29/23 1148  BP:  135/63  Pulse:  63  Resp:  15  Temp: (!) 97.3 F (36.3 C) 98 F (36.7 C)  SpO2:  98%   Gen: In bed, NAD Resp: non-labored breathing, no acute distress Abd: soft, nt  Neuro: MS: He awakens to voice, able to tell me his month, year, name. CN: Endorses seeing fingers wiggle in both hemifield's, crosses midline in both directions Motor: He will lift up his arms and hold them against gravity today, he does the same with both legs, no asterixis.  Sensory: Response to noxious stimulation bilaterally  Pertinent Labs: Ammonia 58 Sodium 134 Calcium 7.9 with an albumin of 2.3 Platelets 58  Impression: 77 year old male with persistent encephalopathy in the setting of bilateral subdural hematomas, cirrhosis secondary to [facility] with hyperammonemia, MRSE on blood cultures.  My suspicion is that his worsening is multifactorial secondary to his liver disease, bacteremia, in the setting of underlying CNS pathology (SDH).  There is possible Keppra is contributing, and reasonable to titrate down on this.  Recommendations: 1) decrease Keppra to 250 twice daily 2) continue lactulose, ensure ammonia continues to trend down 3) neurology will continue to follow  Ritta Slot, MD [facility] [phone]  If 7pm- 7am, please page neurology on call as listed in AMION.

## 2023-07-29 NOTE — Plan of Care (Signed)
  Problem: Coping: Goal: Level of anxiety will decrease Outcome: Progressing   Problem: Health Behavior/Discharge Planning: Goal: Ability to manage health-related needs will improve Outcome: Not Progressing   Problem: Activity: Goal: Risk for activity intolerance will decrease Outcome: Not Progressing   Problem: Nutrition: Goal: Adequate nutrition will be maintained Outcome: Not Progressing   Problem: Nutritional: Goal: Progress toward achieving an optimal weight will improve Outcome: Not Progressing   Problem: Tissue Perfusion: Goal: Adequacy of tissue perfusion will improve Outcome: Not Progressing

## 2023-07-29 NOTE — TOC Progression Note (Signed)
Transition of Care [facility]) - Progression Note    Patient Details  Name: Joshua Berry MRN: [id number] Date of Birth: [birth year]  [facility] CM/SW Contact  Darolyn Rua, [place] Phone Number: 07/29/2023, 3:33 PM  Clinical Narrative:     CSW spoke with patient son Susy Frizzle and patient's spouse [name] during conference call.   They report they no longer want to pursue [facility] due to negative reviews.   They report their goal is for patient to be able to use a call bell and speak for himself prior to moving off the unit. They are concerned should he go to a facility that his needs will not be met, CSW did explain LTAC is for acute care needs and facilities are ale to accommodate higher level of care needs.   At this time, patient family does not want patient moved or discharged until he is ale to use call bell and speak for himself. Then [facility] will explore [facility] and are open to any options in [place] .   MD updated on above.   Per Ethics consult , spouse has final decision making capabilities and son reports he does not have POA paperwork. All parties were agreeable to conference call today.     Barriers to Discharge: Continued Medical Work up  Expected Discharge Plan and Services       Living arrangements for the past 2 months: Assisted Living Facility                                       Social Determinants of Health (SDOH) Interventions SDOH Screenings   Food Insecurity: No Food Insecurity (07/18/2023)  Housing: Low Risk  (07/18/2023)  Transportation Needs: No Transportation Needs (07/18/2023)  Recent Concern: Transportation Needs - Unmet Transportation Needs (06/17/2023)  Utilities: Not At Risk (07/18/2023)  Recent Concern: Utilities - At Risk (06/17/2023)  Depression (PHQ2-9): Low Risk  (04/24/2023)  Social Connections: Unknown (04/29/2022)   Received from [facility]  Tobacco Use: Medium Risk (07/14/2023)    Readmission Risk  Interventions    07/15/2023    8:52 AM 06/18/2023   10:19 AM 05/17/2022    1:45 PM  Readmission Risk Prevention Plan  Transportation Screening Complete Complete Complete  PCP or Specialist Appt within 5-7 Days  Complete Complete  PCP or Specialist Appt within 3-5 Days Complete    Home Care Screening  Complete Complete  Medication Review (RN CM)  Complete Complete  Social Work Consult for Recovery Care Planning/Counseling Complete    Palliative Care Screening Not Applicable    Medication Review Oceanographer) Referral to Pharmacy

## 2023-07-29 NOTE — Consult Note (Signed)
PHARMACY CONSULT NOTE - ELECTROLYTES  Pharmacy Consult for Electrolyte Monitoring and Replacement   Recent Labs: Height: 6\' 2"  (188 cm) Weight: 108.2 kg (238 lb 8.6 oz) IBW/kg (Calculated) : 82.2 Estimated Creatinine Clearance: 99.7 mL/min (by C-G formula based on SCr of 0.64 mg/dL). Potassium (mmol/L)  Date Value  07/29/2023 3.9  05/01/2014 4.2   Magnesium (mg/dL)  Date Value  27/25/3664 1.5 (L)   Calcium (mg/dL)  Date Value  40/34/7425 7.9 (L)   Calcium, Total (mg/dL)  Date Value  95/63/8756 9.4   Albumin (g/dL)  Date Value  43/32/9518 2.3 (L)  05/01/2014 4.2   Phosphorus (mg/dL)  Date Value  84/16/6063 3.0   Sodium (mmol/L)  Date Value  07/29/2023 134 (L)  05/01/2014 136   Corrected Ca: 8.9 mg/dL  Assessment  Joshua Berry is a 77 y.o. male presenting with SDH. PMH significant for NASH with liver cirrhosis, portal HTN, pancytopenia, OSA, s/p TAVR. Pharmacy has been consulted to monitor and replace electrolytes.  Na 134 low and stable, Mg 1.5 this AM needs replacement. All other electrolytes within normal limits.  Diet: dysphagia 1 MIVF: NS @ 75 mL/hr Pertinent medications: lactulose PO and enema, NaCl 1g PO BID  Goal of Therapy: Electrolytes WNL  Plan:  Continue PO Na supplementation as ordered Replace Mg with MgSulfate 2gm IV x 1 Ca acceptable per corrected Ca Phos check twice weekly (Mo & Th) - changed back to daily by MD Check CMP, Mg with AM labs  Thank you for allowing pharmacy to be a part of this patient's care.   Rodriguez-Guzman PharmD, BCPS 07/29/2023 7:39 AM

## 2023-07-29 NOTE — Progress Notes (Signed)
[facility] Progress Note  [name]    ZOX:[id number]  DOA: 07/14/2023     Date of Service: the patient was seen and examined on 07/29/2023  Chief Complaint  Patient presents with   Code Sepsis   Brief hospital course: TOPHER SHIRER is a 77 yo M with hx of NASH with liver cirrhosis, portal HTN, TIPS, GIB/GAVE, pancytopenia, OSA, TAVR, left shoulder replacement, multiple episodes of bacteremia requiring prolonged IV abx treatment who presented to [facility] from [place] assisted living via EMS for evaluation of altered mental status.    ED workup revealed bilateral subdural hematomas with midline shift and signs of herniation. Family in agreement with surgical intervention. NSGY administered platelets and FFP to improve coagulopathy then brought patient to OR for hematoma evacuation.    Patient was in ICU managed by PCCM, then transferred to hospitalist service on 7/31. 07/21/23 he spiked fever, started on prophylactic meningitis treatment.  07/23/23 MRSE positive blood cultures. ID on board. Continue Vanco, stopped Cefepime. MRI brain rule out abscess. Infected hematoma ordered. 07/24/23 MRI of the brain bilateral holohemispheric extra-axial collections measuring 9 mm, mass effect on both superior hemispheres.  Neurosurgery team suggested MRI findings not concerning.  Advised cEEG, neurology asked for follow-up. 8/9: EEG negative for any acute seizure 8/10: Transfer to PCU.  PT and OT eval 8/11: Patient more drowsy, cut back dose of Keppra from 1000 to 500 mg twice daily, ammonia elevated so ordered p.o. lactulose (as rectal may not be working/staying inside to have effect)  Assessment and Plan: # Acute Bilateral Subdural Hematomas  s/p bilateral frontal burr hole washout. Drains removed on 8/1. S/p plt and FFP.  No need for further supplementation. Staples removed on 8/8 by neurosurgery..  Sutures in place and the previous drain sites are dissolvable per neurosurgery Sodium  123 -> 134, continue salt tabs  MRI of the brain bilateral holohemispheric extra-axial collections measuring 9 mm, mass effect on both superior hemispheres. No intervention per Neuro surgery team. Discussed with Neurologist, EEG not showing any active seizure.    Acute metabolic encephalopathy In the setting of subdural hematoma, hepatic encephalopathy, infection.  He has waxing and waning mental status likely due to hyperammonemia and bilateral hemispheric subdural collections On 8/5 Neuro was consulted for persistent somnolence.  EEG neg for seizure, Keppra decreased 1000 ->>500 ->> 250 mg IV bid AMS, fever 8/5- Started treatment for hepatic encephalopathy and meningitis. Rectal tube inserted due to skin breakdown. Continue lactulose enema QID.  Repeat ammonia 63-> 114.  Will try oral lactulose, continue rifaximin.  Concern is that patient may not be holding lactulose in in the rectum for 30 to 45 minutes to have actual effect of medicine Continue vancomycin for MRSE blood cultures.   Neurology following.  EEG negative for seizure. His repeat MRI 07/23/23 with mass effect on both sides may be the cause of his lethargy. Continue neurochecks.  Indwelling foley for urinary retention. 8/11: Patient much more drowsy today so ammonia checked which is elevated.  Lactulose ordered by mouth.  Cutting back Keppra dose to 500 mg twice daily.   Appreciate neurology reevaluation.   If his mentation does not improve neurology may consider Provigil or amitriptyline  If he is not able to tolerate oral lactulose may need to consider NG tube/Dobbhoff tube after talking with his GI doctor at [facility] (family insist we call his GI before putting tube considering his esophageal varices for which they are concerned about rupture if inserting  tube)   Fever, AMS MRSE bacteremia Since pt had a subdural drain that had drained spinal fluid, there is concern for noscomial meningitis. Neurosurgery, who deemed LP to carry  too much risk. Decision made to start tx for meningitis empirically.  Continue IV vancomycin Given MRSE bacteremia, ID recommended to stop cefepime, continue vancomycin for now 8/13 f/u repeat blood cultures, patient did spike fever last night  Acute urinary retention Foley reinsertion and care during the inpatient stay. Once his mental status improves will remove foley with voiding trial.   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 Chronic Leukopenia s/p plt and FFP. Continue lactulose, rifaximin therapy.  Continue to monitor neurochecks. Nursing supportive care. 8/13 increase lactulose 30 g p.o. 3 times daily, Dulcolax 10 mg x 1 dose given as well Ammonia level 58, gradually improving   Frequent PVCs Hypomagnesemia, mag repleted. Monitor electrolytes and replete Continue to monitor on telemetry   OSA Not compliant with CPAP.   Hypothyroidism Continue home dose Synthroid   Type 2 Diabetes Mellitus Sliding scale insulin as per floor protocol   GERD  Continue PPI therapy.   Dyslipidemia Resume statin at discharge.   Body mass index is 30.63 kg/m.  Nutrition Problem: Inadequate oral intake Etiology: acute illness Interventions:    Pressure Injury 06/17/23 Coccyx Mid Stage 1 -  Intact skin with non-blanchable redness of a localized area usually over a bony prominence. (Active)  06/17/23 0615  Location: Coccyx  Location Orientation: Mid  Staging: Stage 1 -  Intact skin with non-blanchable redness of a localized area usually over a bony prominence.  Wound Description (Comments):   Present on Admission:      Pressure Injury 07/18/23 Buttocks Left;Right Stage 1 -  Intact skin with non-blanchable redness of a localized area usually over a bony prominence. (Active)  07/18/23 1450  Location: Buttocks  Location Orientation: Left;Right  Staging: Stage 1 -  Intact skin with  non-blanchable redness of a localized area usually over a bony prominence.  Wound Description (Comments):   Present on Admission: Yes  Dressing Type Foam - Lift dressing to assess site every shift 07/29/23 0800     Diet: Dysphagia 1 diet DVT Prophylaxis: SCD, pharmacological prophylaxis contraindicated due to thrombocytopenia    Advance goals of care discussion: DNR  Family Communication: family was present at bedside, at the time of interview.  The pt provided permission to discuss medical plan with the family. Opportunity was given to ask question and all questions were answered satisfactorily.   Disposition:  Pt is from ALF, admitted with subdural hematoma, NASH liver cirrhosis, hyperammonemia,, still has AMS, which precludes a safe discharge. Discharge to SNF, when clinically stable, may need few more days to become stable.  Subjective: No significant events overnight, patient had low-grade temp last night.  Patient is AO x 3, was sleepy but woke up and was able to follow commands. As per patient's wife patient had PVCs whole night on the monitor.  We will continue to monitor on telemetry    Physical Exam: General: NAD, lying comfortably, sleepy Appear in no distress Eyes: PERRLA ENT: Oral Mucosa Clear, moist  Neck: no JVD,  Cardiovascular: S1 and S2 Present, no Murmur,  Respiratory: good respiratory effort, Bilateral Air entry equal and Decreased, no Crackles, no wheezes Abdomen: Bowel Sound present, Soft and no tenderness,  Skin: no rashes Extremities: no Pedal edema, no calf tenderness Neurologic: Sleepy AO x 1, unable to  follow commands  Gait not checked due to patient safety concerns  Vitals:   07/29/23 0704 07/29/23 0900 07/29/23 1148 07/29/23 1547  BP:   135/63 136/67  Pulse:   63 68  Resp:   15 13  Temp:  (!) 97.3 F (36.3 C) 98 F (36.7 C) 98.5 F (36.9 C)  TempSrc:  Oral    SpO2:   98% 99%  Weight: 108.2 kg     Height:        Intake/Output Summary  (Last 24 hours) at 07/29/2023 1727 Last data filed at 07/29/2023 1300 Gross per 24 hour  Intake 2619.67 ml  Output 2450 ml  Net 169.67 ml   Filed Weights   07/27/23 0446 07/28/23 0500 07/29/23 0704  Weight: 109.3 kg 108.2 kg 108.2 kg    Data Reviewed: I have personally reviewed and interpreted daily labs, tele strips, imagings as discussed above. I reviewed all nursing notes, pharmacy notes, vitals, pertinent old records I have discussed plan of care as described above with RN and patient/family.  CBC: Recent Labs  Lab 07/25/23 0319 07/26/23 0636 07/27/23 0523 07/28/23 0448 07/29/23 0316  WBC 3.6* 3.1* 3.4* 3.6* 3.8*  HGB 9.5* 9.2* 9.0* 9.5* 9.5*  HCT 26.2* 25.8* 25.6* 27.3* 27.1*  MCV 91.6 92.1 92.4 94.8 94.8  PLT 48* 50* 54* 58* 66*   Basic Metabolic Panel: Recent Labs  Lab 07/24/23 0422 07/25/23 0319 07/26/23 0636 07/27/23 0523 07/28/23 0448 07/29/23 0316  NA 132* 133* 136 134* 134* 134*  K 3.8 3.6 3.5 3.4* 3.7 3.9  CL 103 105 109 108 107 107  CO2 21* 23 21* 21* 21* 22  GLUCOSE 150* 126* 106* 102* 105* 131*  BUN 18 14 10 12 11 12   CREATININE 0.73 0.70 0.67 0.70 0.64 0.64  CALCIUM 8.3* 8.0* 8.0* 7.7* 7.9* 7.9*  MG 2.0 1.6* 1.6* 1.7  --  1.5*  PHOS 2.6 2.7 2.6 2.6 2.7 3.0    Studies: No results found.  Scheduled Meds:  vitamin C  500 mg Oral BID   Chlorhexidine Gluconate Cloth  6 each Topical Daily   clotrimazole   Topical BID   feeding supplement  237 mL Oral TID BM   Gerhardt's butt cream   Topical TID   insulin aspart  0-6 Units Subcutaneous TID WC   lactulose  30 g Oral BID   Or   lactulose  300 mL Rectal BID   levothyroxine  100 mcg Oral Q0600   multivitamin with minerals  1 tablet Oral Daily   mouth rinse  15 mL Mouth Rinse 4 times per day   pantoprazole  40 mg Oral BID   rifaximin  550 mg Oral BID   sodium chloride  1 g Oral BID WC   terazosin  5 mg Oral QHS   Continuous Infusions:  sodium chloride Stopped (07/28/23 1506)   levETIRAcetam      vancomycin 1,250 mg (07/29/23 1658)   [START ON 07/30/2023] vancomycin     PRN Meds: acetaminophen, butalbital-acetaminophen-caffeine, hydrALAZINE, labetalol, mouth rinse, polyethylene glycol, polyvinyl alcohol  Time spent: 35 minutes  Author: Gillis Santa. MD [facility] 07/29/2023 5:27 PM  To reach On-call, see care teams to locate the attending and reach out to them via [email or web address]. If 7PM-7AM, please contact night-coverage If you still have difficulty reaching the attending provider, please page the [facility] (Director on Call) for [facility] on amion for assistance.

## 2023-07-29 NOTE — TOC Progression Note (Signed)
Transition of Care [facility]) - Progression Note    Patient Details  Name: Joshua Berry MRN: [id number] Date of Birth: 07/25/1946  Transition of Care [facility]) CM/SW Contact  Darolyn Rua, [place] Phone Number: 07/29/2023, 10:42 AM  Clinical Narrative:     Per MD patient had a fever overnight and is not stable to discharge.   CSW notes that [place] with [employer] is updated and reports she will hold off on starting insurance auth until patient is stable as Berkley Harvey is only good for a few days.     Barriers to Discharge: Continued Medical Work up  Expected Discharge Plan and Services       Living arrangements for the past 2 months: Assisted Living Facility                                       Social Determinants of Health (SDOH) Interventions SDOH Screenings   Food Insecurity: No Food Insecurity (07/18/2023)  Housing: Low Risk  (07/18/2023)  Transportation Needs: No Transportation Needs (07/18/2023)  Recent Concern: Transportation Needs - Unmet Transportation Needs (06/17/2023)  Utilities: Not At Risk (07/18/2023)  Recent Concern: Utilities - At Risk (06/17/2023)  Depression (PHQ2-9): Low Risk  (04/24/2023)  Social Connections: Unknown (04/29/2022)   Received from [facility], [facility]  Tobacco Use: Medium Risk (07/14/2023)    Readmission Risk Interventions    07/15/2023    8:52 AM 06/18/2023   10:19 AM 05/17/2022    1:45 PM  Readmission Risk Prevention Plan  Transportation Screening Complete Complete Complete  PCP or Specialist Appt within 5-7 Days  Complete Complete  PCP or Specialist Appt within 3-5 Days Complete    Home Care Screening  Complete Complete  Medication Review (RN CM)  Complete Complete  Social Work Consult for Recovery Care Planning/Counseling Complete    Palliative Care Screening Not Applicable    Medication Review Oceanographer) Referral to Pharmacy

## 2023-07-29 NOTE — Progress Notes (Signed)
Palliative Care Progress Note, Assessment & Plan   Patient Name: Joshua Berry       Date: 07/29/2023 DOB: 1946-08-10  Age: 77 y.o. MRN#: [id number] Attending Physician: Gillis Santa, MD Primary Care Physician: [facility] Admit Date: 07/14/2023  Subjective: Patient is sitting up in bed in no apparent distress.  He is resting but is easily awakened.  He acknowledges my presence and answers orientation questions appropriately.  His wife is at bedside.  Dr. Amada Jupiter also visited during my encounter.  HPI: 77 y.o. male  with past medical history of NASH with liver cirrhosis, portal HTN, TIPS, GIB/GAVE, pancytopenia, OSA, TAVR, left shoulder replacement, multiple episodes of bacteremia requiring prolonged IV abx treatment  admitted on 07/14/2023 with AMS from [place] nursing facility.    Patient found to have bilateral subdural hematomas with midline shift and signs of herniation.  Platelets and FFP given to improve coagulopathy before patient underwent bilateral frontal burr hole washout.   07/21/23 he spiked fever, started on prophylactic meningitis treatment.  07/23/23 MRSE positive blood cultures. ID on board. Continue Vanco, stopped Cefepime. MRI brain rule out abscess. Infected hematoma ordered. 07/24/23 MRI of the brain bilateral holohemispheric extra-axial collections measuring 9 mm, mass effect on both superior hemispheres.  Neurosurgery team suggested MRI findings not concerning.  Advised cEEG, neurology asked for follow-up. 8/9: EEG negative for any acute seizure 8/10: Transfer to PCU.  PT and OT eval 8/11: Patient more drowsy, cut back dose of Keppra from 1000 to 500 mg twice daily, ammonia elevated so ordered p.o. lactulose (as rectal may not be working/staying inside to have effect)   PMT was  consulted to discuss goals and boundaries of care.  Summary of counseling/coordination of care: After reviewing the patient's chart and assessing the patient at bedside, I counseled with patient, his wife Joshua Berry, and Dr. Amada Jupiter in regards to goals and plan of care.  Dr. Amada Jupiter discussed patient's current medical status from neuro perspective. Plan is to adjust keppra to see if this improves patient's wakefulness. Wife and DIL on speakerphone in agreement with this adjustment. Reviewed multifactorial nature of patient's "sleepiness" which includes his liver disease as well as use of keppra and recent hematoma evacuation. Family shares understanding that more time is needed to see patient's recovery.   I discussed goals of care with Joshua Berry at bedside. She shares that after discussing with family she and family remain in agreement that should patient need artificial nutrition then they would be open to him accepting it on a short term basis. They have discussed this with Dr. Sherryll Burger and have a plan in place should patient require artificial nutrition.   Reviewed that at the moment patient is not in need of artifical nutrition. Encouraging PO intake to continue.  Therapeutic silence, active listening, and emotional support provided.  PMT will continue to follow and support patient and family throughout his hospitalization.   Physical Exam Vitals reviewed.  Constitutional:      General: He is not in acute distress.    Appearance: He is normal weight.  HENT:     Head: Normocephalic.     Mouth/Throat:     Mouth: Mucous membranes are moist.  Eyes:  Pupils: Pupils are equal, round, and reactive to light.  Cardiovascular:     Rate and Rhythm: Normal rate.  Pulmonary:     Effort: Pulmonary effort is normal.  Abdominal:     Palpations: Abdomen is soft.  Skin:    General: Skin is warm and dry.     Comments: Healing bore holes on scalp  Neurological:     Mental Status: He is alert.   Psychiatric:        Mood and Affect: Mood normal.        Behavior: Behavior normal.             Total Time 35 minutes    L. Manon Hilding, FNP-BC Palliative Medicine Team Team Phone # [phone]

## 2023-07-29 NOTE — Progress Notes (Signed)
PT Cancellation Note  Patient Details Name: Joshua Berry MRN: [id number] DOB: 05-Apr-1946   Cancelled Treatment:    Reason Eval/Treat Not Completed: Fatigue/lethargy limiting ability to participate  Attempted co-tx with OT.  Pt lethargic.  BLE PROM with little to no assist from pt.  OT in for UE and ADL tasks but pt not waking enough to safely attempt mobility.  Will return at a later time/date.  No charge.   Danielle Dess 07/29/2023, 11:05 AM

## 2023-07-29 NOTE — Progress Notes (Addendum)
Pharmacy Antibiotic Note  Joshua Berry is a 77 y.o. male admitted on 07/14/2023 with meningitis.  Pharmacy has been consulted for vancomycin and cefepime dosing. Patient s/p elective neurosurgery bilateral burr holes on 7/29 for drainage of chronic SDH.  Bilateral ventriculostomy drains placed in OR and removed on 7/31.  Patient now having fever and altered mental status (PMH non-alcoholic cirrhosis with hepatic encephalopathy)  Today, 07/29/2023 Day #8 vancomycin  Tm/24h afebrile WBC 3.8 Renal: SCR WNL and at baseline- stable  Blood cultures: 4/4 Botltles, BCID = MRSE   (prev blood cx from 7/29 were no growth) Repeat Blood cultures from 8/8 are NGTD  Vancomycin levels: 8/9 vancomycin trough at 03:19 on 1250mg  IV q12h = 13 mcg/mL (prior to 5 maintenance dose), previous dose given at 16:36 8/8 8/13 vancomycin trough at  03:16 on 1250mg  IV q12h = 13 mcg/mL. Previous dose given at 17:11 on 8/12  Plan: Continue Vancomycin 1250mg  IV q12, trough today (8/13) consistent with previous trough).  Trough little below goa, but  hesitant to increase based on patient's age and increase risk of kidney injury.  IF concerns for worsening infection will consider increasing to 1500mg  IV q12h  ADDENDUM - after further discussion with ID will change vancomycin to 1500mg  IV q12h w/ new estimated vancomycin trough = 24m5 mcg/mL( plan to recheck vancomycin trough in ~3 days) Goal vancomycin trough 15-11mcg/mL for CNS infection  Follow renal function and blood cultures Recheck vancomycin trough 8/13 prior to 4am dose   Height: 6\' 2"  (188 cm) Weight: 108.2 kg (238 lb 8.6 oz) IBW/kg (Calculated) : 82.2  Temp (24hrs), Avg:98.7 F (37.1 C), Min:97.3 F (36.3 C), Max:100.7 F (38.2 C)  Recent Labs  Lab 07/25/23 0319 07/26/23 0636 07/27/23 0523 07/28/23 0448 07/29/23 0316  WBC 3.6* 3.1* 3.4* 3.6* 3.8*  CREATININE 0.70 0.67 0.70 0.64 0.64  VANCOTROUGH 13*  --   --   --   --     Estimated Creatinine  Clearance: 99.7 mL/min (by C-G formula based on SCr of 0.64 mg/dL).    Allergies  Allergen Reactions   Camphor Swelling and Rash   Latex Rash   Lisinopril Cough    Other Reaction(s): Cough   Sulfamethoxazole Swelling   Chocolate     Family states he is allergic   Nsaids Other (See Comments)    NASH    Antimicrobials this admission: 8/6 vanco >>  8/6 cefepime >> 8/7  Dose adjustments this admission:   Microbiology results: 8/8 Bcx: NGTD 8/6 BCx: MRSE 7/29 BCx: NG-F 7/29 MRSA PCR: neg  Thank you for allowing pharmacy to be a part of this patient's care.  Juliette Alcide, PharmD, BCPS, BCIDP Work Cell: [phone] 07/29/2023 10:13 AM

## 2023-07-29 NOTE — Progress Notes (Signed)
Nutrition Follow-up  DOCUMENTATION CODES:   Not applicable  INTERVENTION:   -Continue Ensure Enlive po TID, each supplement provides 350 kcal and 20 grams of protein. -Continue Magic cup TID with meals, each supplement provides 290 kcal and 9 grams of protein -Continue MVI po daily  -Continue Vitamin C 500mg  po BID   NUTRITION DIAGNOSIS:   Inadequate oral intake related to acute illness as evidenced by per patient/family report.  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  8/9- s/p BSE- downgraded to dysphagia 1 diet with thin liquids  Reviewed I/O's: +619 ml x 24 hours and -2.6 L since 07/15/23  UOP: 1.8 L x 24 hours  Rectal tube output: 100 ml x 24 hours   Pt remains on a dysphagia 1 diet. Noted variable intake; meal completions 20-75%. Per MD notes, pt and family do not desire feeding tube.   Pt with periods of lethargy. Neurology following.   No wt loss this admission.   Per TOC notes, awaiting possible LTACH placement.   Medications reviewed and include lactulose, keppra, and sodium chloride.   Labs reviewed: CBGS: 101-183 (inpatient orders for glycemic control are 0-6 units insulin aspart TID with meals).    Diet Order:   Diet Order             DIET - DYS 1 Room service appropriate? Yes with Assist; Fluid consistency: Thin  Diet effective now                   EDUCATION NEEDS:   No education needs have been identified at this time  Skin:  Skin Assessment: Skin  Integrity Issues: Skin Integrity Issues:: Stage I, Incisions Stage I: buttocks Incisions: closed head  Last BM:  07/28/23 (type 7- via rectal tube)  Height:   Ht Readings from Last 1 Encounters:  07/14/23 6\' 2"  (1.88 m)    Weight:   Wt Readings from Last 1 Encounters:  07/29/23 108.2 kg    Ideal Body Weight:  86.3 kg  BMI:  Body mass index is 30.63 kg/m.  Estimated Nutritional Needs:   Kcal:  2000-2300kcal/day  Protein:  100-115g/day  Fluid:  2.0-2.3L/day    Levada Schilling, RD, LDN, CDCES Registered Dietitian II Certified Diabetes Care and Education Specialist Please refer to [facility] for RD and/or RD on-call/weekend/after hours pager

## 2023-07-29 NOTE — Ethics Note (Signed)
Ethics Committee Consult  Date: 07/29/2023 Time: 3:08 PM  Primary Committee Member:  Allayne Gitelman Secondary Committee Member:  N/A  Does the patient have decision making capacity?  No - No  Does the patient have an Scientist, water quality or Health Care Power of Attorney?  No  Legal Decision Maker:  Patient is married, so in absence of any AD paperwork, she would be the healthcare decision maker Relationship to Patient:  wife Mitigating Factors:  Yes- See below - two family members with differing responses to staff  Name and contact information for person who requested the consult:  Morrie Sheldon - Social Worker [phone]  Attending Physician:  Gillis Santa, MD Was attending physician notified?  No  Other individuals involved, present/attending, their names, relationship/role to the patient:  This consult was a phone conversation between [place] -Child psychotherapist and Rosalita Chessman - Camera operator member  Individuals involved but unable to attend/participate their names, relationship/role to the patient:  N/A  Reason for the consult / ethical problem (eg. Goals of care, values clarification, forum for discussion, conflict resolution, autonomy, beneficence, etc.):  Clarify who is the healthcare decision maker for a patient who lacks capacity to make his own decisions at this time.  Preliminary Information:  Patient is in his 31's and listed his son as primary contact in his chart. Hospital speaking with son and patient's wife, who differ on preferred placement of patient after discharge. Morrie Sheldon contacted ethics consult number for clarification on who should be the person staff goes to for healthcare decisions regarding the patient.  Objective facts related to or contributing to the problem (eg.  Pertinent medical history, facts of the case):  Disagreement between son and spouse of patient on disposition after discharge. Morrie Sheldon states patient does not have capacity at this time to make his own  healthcare decisions. Seeking clarity on who has authority to make healthcare decisions on behalf of patient.  Summary of consultation and recommendations:  I advised that if the patient does not have a healthcare power of attorney document or any other legal document that gives someone other than his spouse the right to make healthcare decisions, then the spouse is the person who has the legal authority to speak on behalf of the patient's healthcare situation. I encouraged also bringing the son into the conversation, perhaps with a family meeting, to try to get everyone in the loop/on board with the patient's care.

## 2023-07-29 NOTE — Progress Notes (Signed)
Date of Admission:  07/14/2023      ID: Joshua Berry is a 77 y.o. male  Principal Problem:   Subdural hematoma (HCC) Active Problems:   Cirrhosis of liver without ascites (HCC)   Compression of brain (HCC)   Uncal herniation (HCC)   Acute nontraumatic intracranial subdural hematoma (HCC)   Staphylococcus epidermidis bacteremia    Subjective: Lethargic Non verbal  Medications:   vitamin C  500 mg Oral BID   Chlorhexidine Gluconate Cloth  6 each Topical Daily   clotrimazole   Topical BID   feeding supplement  237 mL Oral TID BM   Gerhardt's butt cream   Topical TID   insulin aspart  0-6 Units Subcutaneous TID WC   lactulose  30 g Oral BID   Or   lactulose  300 mL Rectal BID   levothyroxine  100 mcg Oral Q0600   multivitamin with minerals  1 tablet Oral Daily   mouth rinse  15 mL Mouth Rinse 4 times per day   pantoprazole  40 mg Oral BID   rifaximin  550 mg Oral BID   sodium chloride  1 g Oral BID WC   terazosin  5 mg Oral QHS    Objective: Vital signs in last 24 hours: Patient Vitals for the past 24 hrs:  BP Temp Temp src Pulse Resp SpO2 Weight  07/29/23 1148 135/63 98 F (36.7 C) -- 63 15 98 % --  07/29/23 0900 -- (!) 97.3 F (36.3 C) Oral -- -- -- --  07/29/23 0704 -- -- -- -- -- -- 108.2 kg  07/29/23 0430 123/60 98.1 F (36.7 C) -- 69 18 97 % --  07/29/23 0027 131/62 98 F (36.7 C) -- 84 20 97 % --  07/28/23 2231 130/65 99 F (37.2 C) -- 78 14 97 % --  07/28/23 1949 137/64 (!) 100.7 F (38.2 C) -- 77 18 97 % --  07/28/23 1758 (!) 141/77 98.6 F (37 C) -- 78 17 96 % --      PHYSICAL EXAM:  General: lethargic- opens eyes to calling his name Non verbal afebrile Head:scalp - dressing not removed Lungs: Clear to auscultation bilaterally. No Wheezing or Rhonchi. No rales. Heart: s1s2 Abdomen: Soft, non-tender,not distended. Bowel sounds normal. No masses Extremities: atraumatic, no cyanosis. No edema. No clubbing Skin: No rashes or lesions. Or  bruising Lymph: Cervical, supraclavicular normal. Neurologic: moves all limbs  Lab Results    Latest Ref Rng & Units 07/29/2023    3:16 AM 07/28/2023    4:48 AM 07/27/2023    5:23 AM  CBC  WBC 4.0 - 10.5 K/uL 3.8  3.6  3.4   Hemoglobin 13.0 - 17.0 g/dL 9.5  9.5  9.0   Hematocrit 39.0 - 52.0 % 27.1  27.3  25.6   Platelets 150 - 400 K/uL 66  58  54        Latest Ref Rng & Units 07/29/2023    3:16 AM 07/28/2023    4:48 AM 07/27/2023    5:23 AM  CMP  Glucose 70 - 99 mg/dL 409  811  914   BUN 8 - 23 mg/dL 12  11  12    Creatinine 0.61 - 1.24 mg/dL 7.82  9.56  2.13   Sodium 135 - 145 mmol/L 134  134  134   Potassium 3.5 - 5.1 mmol/L 3.9  3.7  3.4   Chloride 98 - 111 mmol/L 107  107  108  CO2 22 - 32 mmol/L 22  21  21    Calcium 8.9 - 10.3 mg/dL 7.9  7.9  7.7   Total Protein 6.5 - 8.1 g/dL  5.3    Total Bilirubin 0.3 - 1.2 mg/dL  1.8    Alkaline Phos 38 - 126 U/L  79    AST 15 - 41 U/L  52    ALT 0 - 44 U/L  42        Microbiology: Bc- staph epidermidis X 4 Studies/Results: CT HEAD WO CONTRAST ( )  Result Date: 07/28/2023 CLINICAL DATA:  Mental status change, unknown cause. EXAM: CT HEAD WITHOUT CONTRAST TECHNIQUE: Contiguous axial images were obtained from the base of the skull through the vertex without intravenous contrast. RADIATION DOSE REDUCTION: This exam was performed according to the departmental dose-optimization program which includes automated exposure control, adjustment of the mA and/or kV according to patient size and/or use of iterative reconstruction technique. COMPARISON:  Head CT 07/21/2023.  Brain MRI 07/23/2023. FINDINGS: Brain: Similar size of the bilateral subdural collections compared to the recent brain MRI, accounting for differences in modality. Unchanged 6 mm of leftward midline shift. Continued decrease in pneumocephalus. No hydrocephalus. Basilar cisterns are patent. Vascular: No hyperdense vessel or unexpected calcification. Skull: Unchanged postoperative  appearance from recent bifrontal burr holes. Sinuses/Orbits: No acute findings. Other: None. IMPRESSION: Similar size of the bilateral subdural collections compared to the recent brain MRI, accounting for differences in modality. Unchanged 6 mm of leftward midline shift. Electronically Signed   By: Orvan Falconer M.D.   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

## 2023-07-29 NOTE — Plan of Care (Signed)
  Problem: Coping: Goal: Level of anxiety will decrease Outcome: Progressing   Problem: Health Behavior/Discharge Planning: Goal: Ability to manage health-related needs will improve Outcome: Not Progressing   Problem: Clinical Measurements: Goal: Respiratory complications will improve Outcome: Not Progressing   Problem: Skin Integrity: Goal: Risk for impaired skin integrity will decrease Outcome: Not Progressing   Problem: Health Behavior/Discharge Planning: Goal: Ability to manage health-related needs will improve Outcome: Not Progressing   Problem: Nutritional: Goal: Maintenance of adequate nutrition will improve Outcome: Not Progressing   Problem: Tissue Perfusion: Goal: Adequacy of tissue perfusion will improve Outcome: Not Progressing

## 2023-07-30 DIAGNOSIS — R7881 Bacteremia: Secondary | ICD-10-CM | POA: Diagnosis not present

## 2023-07-30 DIAGNOSIS — G935 Compression of brain: Secondary | ICD-10-CM | POA: Diagnosis not present

## 2023-07-30 DIAGNOSIS — G9340 Encephalopathy, unspecified: Secondary | ICD-10-CM | POA: Diagnosis not present

## 2023-07-30 DIAGNOSIS — S065XAA Traumatic subdural hemorrhage with loss of consciousness status unknown, initial encounter: Secondary | ICD-10-CM | POA: Diagnosis not present

## 2023-07-30 DIAGNOSIS — R400 Somnolence: Secondary | ICD-10-CM | POA: Diagnosis not present

## 2023-07-30 LAB — GLUCOSE, CAPILLARY
Glucose-Capillary: 127 mg/dL — ABNORMAL HIGH (ref 70–99)
Glucose-Capillary: 175 mg/dL — ABNORMAL HIGH (ref 70–99)
Glucose-Capillary: 200 mg/dL — ABNORMAL HIGH (ref 70–99)

## 2023-07-30 MED ORDER — MAGNESIUM OXIDE -MG SUPPLEMENT 400 (240 MG) MG PO TABS
400.0000 mg | ORAL_TABLET | Freq: Two times a day (BID) | ORAL | Status: DC
  Administered 2023-07-31 – 2023-08-01 (×3): 400 mg via ORAL
  Filled 2023-07-30 (×3): qty 1

## 2023-07-30 MED ORDER — MAGNESIUM SULFATE 4 GM/100ML IV SOLN
4.0000 g | Freq: Once | INTRAVENOUS | Status: AC
  Administered 2023-07-30: 4 g via INTRAVENOUS
  Filled 2023-07-30: qty 100

## 2023-07-30 MED ORDER — BISACODYL 5 MG PO TBEC
10.0000 mg | DELAYED_RELEASE_TABLET | Freq: Two times a day (BID) | ORAL | Status: DC
  Administered 2023-07-30 – 2023-08-01 (×4): 10 mg via ORAL
  Filled 2023-07-30 (×5): qty 2

## 2023-07-30 NOTE — Progress Notes (Signed)
Date of Admission:  07/14/2023      ID: Joshua Berry is a 77 y.o. male  Principal Problem:   Subdural hematoma (HCC) Active Problems:   Cirrhosis of liver without ascites (HCC)   Compression of brain (HCC)   Uncal herniation (HCC)   Acute nontraumatic intracranial subdural hematoma (HCC)   Staphylococcus epidermidis bacteremia    Subjective: Says he is okay  Medications:   vitamin C  500 mg Oral BID   bisacodyl  10 mg Oral BID   Chlorhexidine Gluconate Cloth  6 each Topical Daily   clotrimazole   Topical BID   feeding supplement  237 mL Oral TID BM   Gerhardt's butt cream   Topical TID   insulin aspart  0-6 Units Subcutaneous TID WC   lactulose  30 g Oral TID   Or   lactulose  300 mL Rectal TID   levothyroxine  100 mcg Oral Q0600   [START ON 07/31/2023] magnesium oxide  400 mg Oral BID   multivitamin with minerals  1 tablet Oral Daily   mouth rinse  15 mL Mouth Rinse 4 times per day   pantoprazole  40 mg Oral BID   rifaximin  550 mg Oral BID   sodium chloride  1 g Oral BID WC   terazosin  5 mg Oral QHS    Objective: Vital signs in last 24 hours: Patient Vitals for the past 24 hrs:  BP Temp Temp src Pulse Resp SpO2 Weight  07/30/23 1225 (!) 90/45 98.8 F (37.1 C) -- 65 15 95 % --  07/30/23 0845 134/66 98.4 F (36.9 C) -- 65 18 97 % --  07/30/23 0500 -- -- -- -- -- -- 107.7 kg  07/30/23 0449 (!) 144/70 98.2 F (36.8 C) Oral 63 16 99 % --  07/30/23 0016 (!) 152/60 98.1 F (36.7 C) Oral 62 18 99 % --  07/29/23 2103 (!) 151/63 98.7 F (37.1 C) Oral 68 18 97 % --  07/29/23 1547 136/67 98.5 F (36.9 C) -- 68 13 99 % --      PHYSICAL EXAM:  General: awake, but very slow movements, speech slow Scalp surgical site - no erythema, discharge or swelling Lungs: Clear to auscultation bilaterally. No Wheezing or Rhonchi. No rales. Heart: s1s2 Abdomen: Soft, non-tender,not distended. Bowel sounds normal. No masses Extremities: atraumatic, no cyanosis. No edema. No  clubbing Skin: No rashes or lesions. Or bruising Lymph: Cervical, supraclavicular normal. Neurologic: moves all limbs  Lab Results    Latest Ref Rng & Units 07/30/2023    3:15 AM 07/29/2023    3:16 AM 07/28/2023    4:48 AM  CBC  WBC 4.0 - 10.5 K/uL 3.0  3.8  3.6   Hemoglobin 13.0 - 17.0 g/dL 9.6  9.5  9.5   Hematocrit 39.0 - 52.0 % 26.7  27.1  27.3   Platelets 150 - 400 K/uL 70  66  58        Latest Ref Rng & Units 07/30/2023    3:15 AM 07/29/2023    3:16 AM 07/28/2023    4:48 AM  CMP  Glucose 70 - 99 mg/dL 409  811  914   BUN 8 - 23 mg/dL 11  12  11    Creatinine 0.61 - 1.24 mg/dL 7.82  9.56  2.13   Sodium 135 - 145 mmol/L 135  134  134   Potassium 3.5 - 5.1 mmol/L 3.8  3.9  3.7   Chloride 98 -  111 mmol/L 109  107  107   CO2 22 - 32 mmol/L 22  22  21    Calcium 8.9 - 10.3 mg/dL 8.2  7.9  7.9   Total Protein 6.5 - 8.1 g/dL   5.3   Total Bilirubin 0.3 - 1.2 mg/dL   1.8   Alkaline Phos 38 - 126 U/L   79   AST 15 - 41 U/L   52   ALT 0 - 44 U/L   42       Microbiology: Bc- staph epidermidis X 4 07/24/23 BC NG 07/29/23 BC sent Studies/Results: CT HEAD WO CONTRAST ( )  Result Date: 07/28/2023 CLINICAL DATA:  Mental status change, unknown cause. EXAM: CT HEAD WITHOUT CONTRAST TECHNIQUE: Contiguous axial images were obtained from the base of the skull through the vertex without intravenous contrast. RADIATION DOSE REDUCTION: This exam was performed according to the departmental dose-optimization program which includes automated exposure control, adjustment of the mA and/or kV according to patient size and/or use of iterative reconstruction technique. COMPARISON:  Head CT 07/21/2023.  Brain MRI 07/23/2023. FINDINGS: Brain: Similar size of the bilateral subdural collections compared to the recent brain MRI, accounting for differences in modality. Unchanged 6 mm of leftward midline shift. Continued decrease in pneumocephalus. No hydrocephalus. Basilar cisterns are patent. Vascular: No  hyperdense vessel or unexpected calcification. Skull: Unchanged postoperative appearance from recent bifrontal burr holes. Sinuses/Orbits: No acute findings. Other: None. IMPRESSION: Similar size of the bilateral subdural collections compared to the recent brain MRI, accounting for differences in modality. Unchanged 6 mm of leftward midline shift. Electronically Signed   By: Orvan Falconer M.D.   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

## 2023-07-30 NOTE — Progress Notes (Signed)
Occupational Therapy Treatment Patient Details Name: Joshua Berry MRN: [id number] DOB: November 30, 1946 Today's Date: 07/30/2023   History of present illness Pt is a 77 year old male admitted with Acute Bilateral Subdural Hematomas  s/p bilateral frontal burr hole washout, acute metabolic encephalopathy;    PMH significant for liver cirrhosis, portal HTN, TIPS, GIB/GAVE, pancytopenia, OSA, TAVR, left shoulder replacement, multiple episodes of bacteremia requiring prolonged IV abx treatment   OT comments  Mr Renfro was seen for OT treatment on this date. Upon arrival to room pt returning to bed with PT, agreeable to bed level tx session. Pt requires SETUP self-drinking at bed in chair position decreasing to MIN A as pt fatigues - cues to attend to task. MIN A face washing at bed in chair position. Tolerates 10 min at bed in chair position, requires multimodal cues to maintain eyes open. Follows commands with time and repetition. Pt making limited progress toward goals, will continue to follow POC. Discharge recommendation remains appropriate.       If plan is discharge home, recommend the following:  Two people to help with walking and/or transfers;Two people to help with bathing/dressing/bathroom;Help with stairs or ramp for entrance   Equipment Recommendations  Other (comment) (defer)    Recommendations for Other Services      Precautions / Restrictions Precautions Precautions: Fall Restrictions Weight Bearing Restrictions: No       Mobility Bed Mobility Overal bed mobility: Needs Assistance             General bed mobility comments: tolerated bed in chair for 10 min    Transfers                   General transfer comment: deferred 2/2 fatigue and lethargy         ADL either performed or assessed with clinical judgement   ADL Overall ADL's : Needs assistance/impaired                                       General ADL Comments: SETUP self-drinking  at bed in chair position decreasing to MIN A as pt fatigues - cues to attend to task. MIN A face washing at bed in chair position.      Cognition Arousal: Lethargic Behavior During Therapy: Flat affect Overall Cognitive Status: Impaired/Different from baseline Area of Impairment: Following commands                       Following Commands: Follows one step commands inconsistently                           Pertinent Vitals/ Pain       Pain Assessment Pain Assessment: Faces Faces Pain Scale: Hurts little more Pain Location: BLE with movement Pain Descriptors / Indicators: Aching Pain Intervention(s): Limited activity within patient's tolerance, Repositioned   Frequency  Min 1X/week        Progress Toward Goals  OT Goals(current goals can now be found in the care plan section)  Progress towards OT goals: Progressing toward goals  Acute Rehab OT Goals Patient Stated Goal: pt unable to participate OT Goal Formulation: Patient unable to participate in goal setting Time For Goal Achievement: 08/09/23 Potential to Achieve Goals: Fair ADL Goals Pt Will Perform Grooming: with min assist;sitting Pt Will Perform Lower Body Dressing: with min  assist;sit to/from stand Pt Will Transfer to Toilet: with mod assist;stand pivot transfer  Plan Discharge plan remains appropriate;Frequency remains appropriate    Co-evaluation                 AM-PAC OT "6 Clicks" Daily Activity     Outcome Measure   Help from another person eating meals?: A Lot Help from another person taking care of personal grooming?: A Lot Help from another person toileting, which includes using toliet, bedpan, or urinal?: Total Help from another person bathing (including washing, rinsing, drying)?: Total Help from another person to put on and taking off regular upper body clothing?: A Lot Help from another person to put on and taking off regular lower body clothing?: A Lot 6 Click  Score: 10    End of Session    OT Visit Diagnosis: Other abnormalities of gait and mobility (R26.89);Muscle weakness (generalized) (M62.81)   Activity Tolerance Patient limited by lethargy   Patient Left in bed;with call bell/phone within reach;with bed alarm set;with nursing/sitter in room   Nurse Communication          Time: 1610-9604 OT Time Calculation (min): 10 min  Charges: OT General Charges $OT Visit: 1 Visit OT Treatments $Self Care/Home Management : 8-22 mins  Kathie Dike, M.S. OTR/L  07/30/23, 4:14 PM  ascom [phone]

## 2023-07-30 NOTE — Progress Notes (Signed)
Palliative Care Progress Note, Assessment & Plan   Patient Name: Joshua Berry       Date: 07/30/2023 DOB: 1946/03/15  Age: 77 y.o. MRN#: [id number] Attending Physician: Gillis Santa, MD Primary Care Physician: [facility] Admit Date: 07/14/2023  Subjective: Patient is sitting up in bed in no apparent distress.  He acknowledges my presence and is able to make his wishes known.  His responses are delayed but appropriate.  His step daughter in law is at bedside during my visit. She stayed with him overnight and has just helped him completed his breakfast.   HPI: 77 y.o. male  with past medical history of NASH with liver cirrhosis, portal HTN, TIPS, GIB/GAVE, pancytopenia, OSA, TAVR, left shoulder replacement, multiple episodes of bacteremia requiring prolonged IV abx treatment  admitted on 07/14/2023 with AMS from [facility].    Patient found to have bilateral subdural hematomas with midline shift and signs of herniation.  Platelets and FFP given to improve coagulopathy before patient underwent bilateral frontal burr hole washout.   07/21/23 he spiked fever, started on prophylactic meningitis treatment.  07/23/23 MRSE positive blood cultures. ID on board. Continue Vanco, stopped Cefepime. MRI brain rule out abscess. Infected hematoma ordered. 07/24/23 MRI of the brain bilateral holohemispheric extra-axial collections measuring 9 mm, mass effect on both superior hemispheres.  Neurosurgery team suggested MRI findings not concerning.  Advised cEEG, neurology asked for follow-up. 8/9: EEG negative for any acute seizure 8/10: Transfer to PCU.  PT and OT eval 8/11: Patient more drowsy, cut back dose of Keppra from 1000 to 500 mg twice daily, ammonia elevated so ordered p.o. lactulose (as rectal may  not be working/staying inside to have effect) 8/13: Neuro reduced Keppra to 250mg  BID   PMT was consulted to discuss goals and boundaries of care.  Summary of counseling/coordination of care: After reviewing the patient's chart and assessing the patient at bedside, I spoke with patient and daughter-in-law at bedside in regards to plan and goals of care.  Symptoms assessed.  Patient has no acute complaints at this time.  He endorses he is sleepy.  No adjustments to medications needed at this time.  We discussed patient's functional status. Encouraged patient to participate in feeding himself, move/adjust his linens himself, utilize the TV remote, and overall attempt to participate in daily cares/ADLs to increase mobility. PT/OT following.   Reviewed nutritional status. Patient having adequate PO intake with total assist for feeding. Continued support of PO intake encouraged to promote healing and strength.   Cognitive functional discussed. DIL endorses he is more alert in the last two days. Reviewed multiple factors contributing to patient's "sleepiness".   No adjustment to POC at this time. Patient is showing small improvements daily but time for outcomes needed.   Questions and concerns were addressed. PMT will continue to follow and support patient and family throughout his hospitalization.   Physical Exam Constitutional:      General: He is not in acute distress. HENT:     Head: Normocephalic.     Mouth/Throat:     Mouth: Mucous membranes are moist.  Cardiovascular:     Rate and Rhythm: Normal rate.  Pulmonary:  Effort: Pulmonary effort is normal.  Abdominal:     Palpations: Abdomen is soft.  Musculoskeletal:     Comments: Generalized weakness  Skin:    General: Skin is warm and dry.  Neurological:     Mental Status: He is alert.  Psychiatric:        Behavior: Behavior normal.             Total Time 25 minutes    L. Manon Hilding, FNP-BC Palliative Medicine  Team Team Phone # [phone]

## 2023-07-30 NOTE — Plan of Care (Signed)
  Problem: Health Behavior/Discharge Planning: Goal: Ability to manage health-related needs will improve Outcome: Progressing   Problem: Clinical Measurements: Goal: Ability to maintain clinical measurements within normal limits will improve Outcome: Progressing Goal: Will remain free from infection Outcome: Progressing Goal: Diagnostic test results will improve Outcome: Progressing Goal: Respiratory complications will improve Outcome: Progressing Goal: Cardiovascular complication will be avoided Outcome: Progressing   Problem: Activity: Goal: Risk for activity intolerance will decrease Outcome: Progressing   Problem: Nutrition: Goal: Adequate nutrition will be maintained Outcome: Progressing   Problem: Coping: Goal: Level of anxiety will decrease Outcome: Progressing   Problem: Elimination: Goal: Will not experience complications related to bowel motility Outcome: Progressing Goal: Will not experience complications related to urinary retention Outcome: Progressing   Problem: Pain Managment: Goal: General experience of comfort will improve Outcome: Progressing   Problem: Safety: Goal: Ability to remain free from injury will improve Outcome: Progressing   Problem: Skin Integrity: Goal: Risk for impaired skin integrity will decrease Outcome: Progressing   Problem: Education: Goal: Ability to describe self-care measures that may prevent or decrease complications (Diabetes Survival Skills Education) will improve Outcome: Progressing Goal: Individualized Educational Video(s) Outcome: Progressing   Problem: Coping: Goal: Ability to adjust to condition or change in health will improve Outcome: Progressing   Problem: Fluid Volume: Goal: Ability to maintain a balanced intake and output will improve Outcome: Progressing   Problem: Health Behavior/Discharge Planning: Goal: Ability to identify and utilize available resources and services will improve Outcome:  Progressing Goal: Ability to manage health-related needs will improve Outcome: Progressing   Problem: Metabolic: Goal: Ability to maintain appropriate glucose levels will improve Outcome: Progressing   Problem: Nutritional: Goal: Maintenance of adequate nutrition will improve Outcome: Progressing Goal: Progress toward achieving an optimal weight will improve Outcome: Progressing   Problem: Skin Integrity: Goal: Risk for impaired skin integrity will decrease Outcome: Progressing   Problem: Tissue Perfusion: Goal: Adequacy of tissue perfusion will improve Outcome: Progressing   Problem: Education: Goal: Ability to describe self-care measures that may prevent or decrease complications (Diabetes Survival Skills Education) will improve Outcome: Progressing   Problem: Coping: Goal: Ability to adjust to condition or change in health will improve Outcome: Progressing   Problem: Fluid Volume: Goal: Ability to maintain a balanced intake and output will improve Outcome: Progressing   Problem: Health Behavior/Discharge Planning: Goal: Ability to identify and utilize available resources and services will improve Outcome: Progressing Goal: Ability to manage health-related needs will improve Outcome: Progressing   Problem: Metabolic: Goal: Ability to maintain appropriate glucose levels will improve Outcome: Progressing   Problem: Nutritional: Goal: Maintenance of adequate nutrition will improve Outcome: Progressing Goal: Progress toward achieving an optimal weight will improve Outcome: Progressing   Problem: Skin Integrity: Goal: Risk for impaired skin integrity will decrease Outcome: Progressing   Problem: Tissue Perfusion: Goal: Adequacy of tissue perfusion will improve Outcome: Progressing

## 2023-07-30 NOTE — Progress Notes (Signed)
[facility] Progress Note  [name]    CZY:[id number]  DOA: 07/14/2023     Date of Service: the patient was seen and examined on 07/30/2023  Chief Complaint  Patient presents with   Code Sepsis   Brief hospital course: Joshua Berry is a 77 yo M with hx of NASH with liver cirrhosis, portal HTN, TIPS, GIB/GAVE, pancytopenia, OSA, TAVR, left shoulder replacement, multiple episodes of bacteremia requiring prolonged IV abx treatment who presented to [facility] ED from [facility] via EMS for evaluation of altered mental status.    ED workup revealed bilateral subdural hematomas with midline shift and signs of herniation. Family in agreement with surgical intervention. NSGY administered platelets and FFP to improve coagulopathy then brought patient to OR for hematoma evacuation.    Patient was in ICU managed by PCCM, then transferred to hospitalist service on 7/31. 07/21/23 he spiked fever, started on prophylactic meningitis treatment.  07/23/23 MRSE positive blood cultures. ID on board. Continue Vanco, stopped Cefepime. MRI brain rule out abscess. Infected hematoma ordered. 07/24/23 MRI of the brain bilateral holohemispheric extra-axial collections measuring 9 mm, mass effect on both superior hemispheres.  Neurosurgery team suggested MRI findings not concerning.  Advised cEEG, neurology asked for follow-up. 8/9: EEG negative for any acute seizure 8/10: Transfer to PCU.  PT and OT eval 8/11: Patient more drowsy, cut back dose of Keppra from 1000 to 500 mg twice daily, ammonia elevated so ordered p.o. lactulose (as rectal may not be working/staying inside to have effect)  Assessment and Plan: # Acute Bilateral Subdural Hematomas  s/p bilateral frontal burr hole washout. Drains removed on 8/1. S/p plt and FFP.  No need for further supplementation. Staples removed on 8/8 by neurosurgery..  Sutures in place and the previous drain sites are dissolvable per neurosurgery Sodium  123 -> 134, continue salt tabs  MRI of the brain bilateral holohemispheric extra-axial collections measuring 9 mm, mass effect on both superior hemispheres. No intervention per Neuro surgery team. Discussed with Neurologist, EEG not showing any active seizure.  8/12 CT Head: Similar size of the bilateral subdural collections compared to the recent brain MRI, accounting for differences in modality. Unchanged 6 mm of leftward midline shift. 8/14 follow neurosurgery for further recommendation   # Acute metabolic encephalopathy In the setting of subdural hematoma, hepatic encephalopathy, infection.  He has waxing and waning mental status likely due to hyperammonemia and bilateral hemispheric subdural collections On 8/5 Neuro was consulted for persistent somnolence.  EEG neg for seizure, Keppra decreased 1000 ->>500 ->> 250 mg IV bid AMS, fever 8/5- Started treatment for hepatic encephalopathy and meningitis. Rectal tube inserted due to skin breakdown. Continue lactulose enema QID.  Repeat ammonia 63-> 114.  Will try oral lactulose, continue rifaximin.  Concern is that patient may not be holding lactulose in in the rectum for 30 to 45 minutes to have actual effect of medicine Continue vancomycin for MRSE blood cultures.   Neurology following.  EEG negative for seizure. His repeat MRI 07/23/23 with mass effect on both sides may be the cause of his lethargy. Continue neurochecks.  Indwelling foley for urinary retention. 8/11: Patient much more drowsy today so ammonia checked which is elevated.  Lactulose ordered by mouth.  Cutting back Keppra dose to 500 mg twice daily.   Appreciate neurology reevaluation.   If his mentation does not improve neurology may consider Provigil or amitriptyline  If he is not able to tolerate oral lactulose may need to  consider NG tube/Dobbhoff tube after talking with his GI doctor at [facility] (family insist we call his GI before putting tube considering his esophageal varices  for which they are concerned about rupture if inserting tube)   # Fever, AMS # MRSE bacteremia Since pt had a subdural drain that had drained spinal fluid, there is concern for noscomial meningitis. Neurosurgery, who deemed LP to carry too much risk. Decision made to start tx for meningitis empirically.  Continue IV vancomycin Given MRSE bacteremia, ID recommended to stop cefepime, continue vancomycin for now 8/13 repeat blood cultures was ordered due to fever, Bld Cx NGTD  8/14 follow TTE   # Acute urinary retention Foley reinsertion and care during the inpatient stay. Once his mental status improves will remove foley with voiding trial.   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 Chronic Leukopenia s/p plt and FFP. Continue lactulose, rifaximin therapy.  Continue to monitor neurochecks. Nursing supportive care. 8/13 increase lactulose 30 g p.o. 3 times daily, Dulcolax 10 mg x 1 dose given as well Ammonia level 58, gradually improving 8/14 started Dulcolax 10 mg p.o. twice daily for 3 days  # Frequent PVCs # Hypomagnesemia, mag repleted. Monitor electrolytes and replete Continue to monitor on telemetry   # OSA: Not compliant with CPAP. # Hypothyroidism: Continue home dose Synthroid # Type 2 Diabetes Mellitus: Sliding scale insulin as per floor protocol # GERD: Continue PPI therapy. # Dyslipidemia: Resume statin at discharge.   Body mass index is 30.48 kg/m.  Nutrition Problem: Inadequate oral intake Etiology: acute illness Interventions:    Pressure Injury 06/17/23 Coccyx Mid Stage 1 -  Intact skin with non-blanchable redness of a localized area usually over a bony prominence. (Active)  06/17/23 0615  Location: Coccyx  Location Orientation: Mid  Staging: Stage 1 -  Intact skin with non-blanchable redness of a localized area usually over a bony prominence.  Wound  Description (Comments):   Present on Admission:      Pressure Injury 07/18/23 Buttocks Left;Right Stage 1 -  Intact skin with non-blanchable redness of a localized area usually over a bony prominence. (Active)  07/18/23 1450  Location: Buttocks  Location Orientation: Left;Right  Staging: Stage 1 -  Intact skin with non-blanchable redness of a localized area usually over a bony prominence.  Wound Description (Comments):   Present on Admission: Yes  Dressing Type Foam - Lift dressing to assess site every shift 07/30/23 1000     Diet: Dysphagia 1 diet DVT Prophylaxis: SCD, pharmacological prophylaxis contraindicated due to thrombocytopenia    Advance goals of care discussion: DNR  Family Communication: family was present at bedside, at the time of interview.  The pt provided permission to discuss medical plan with the family. Opportunity was given to ask question and all questions were answered satisfactorily.   Disposition:  Pt is from ALF, admitted with subdural hematoma, NASH liver cirrhosis, hyperammonemia,, still has AMS, which precludes a safe discharge. Discharge to SNF, when clinically stable, may need few more days to become stable.  Subjective: No significant events overnight, today patient was sleepy, family was at bedside, stated that patient was awake in the morning and he did not eat breakfast.  Currently patient is sleepy.   Physical Exam: General: NAD, lying comfortably, sleepy Appear in no distress Eyes: PERRLA ENT: Oral Mucosa Clear, moist  Neck: no JVD,  Cardiovascular: S1 and S2 Present, no Murmur,  Respiratory: good respiratory  effort, Bilateral Air entry equal and Decreased, no Crackles, no wheezes Abdomen: Bowel Sound present, Soft and no tenderness,  Skin: no rashes Extremities: no Pedal edema, no calf tenderness Neurologic: Sleepy, unable to follow commands  Gait not checked due to patient safety concerns  Vitals:   07/30/23 0449 07/30/23 0500 07/30/23  0845 07/30/23 1225  BP: (!) 144/70  134/66 (!) 90/45  Pulse: 63  65 65  Resp: 16  18 15   Temp: 98.2 F (36.8 C)  98.4 F (36.9 C) 98.8 F (37.1 C)  TempSrc: Oral     SpO2: 99%  97% 95%  Weight:  107.7 kg    Height:        Intake/Output Summary (Last 24 hours) at 07/30/2023 1509 Last data filed at 07/30/2023 1201 Gross per 24 hour  Intake 721.41 ml  Output 2450 ml  Net -1728.59 ml   Filed Weights   07/28/23 0500 07/29/23 0704 07/30/23 0500  Weight: 108.2 kg 108.2 kg 107.7 kg    Data Reviewed: I have personally reviewed and interpreted daily labs, tele strips, imagings as discussed above. I reviewed all nursing notes, pharmacy notes, vitals, pertinent old records I have discussed plan of care as described above with RN and patient/family.  CBC: Recent Labs  Lab 07/26/23 0636 07/27/23 0523 07/28/23 0448 07/29/23 0316 07/30/23 0315  WBC 3.1* 3.4* 3.6* 3.8* 3.0*  NEUTROABS  --   --   --   --  2.0  HGB 9.2* 9.0* 9.5* 9.5* 9.6*  HCT 25.8* 25.6* 27.3* 27.1* 26.7*  MCV 92.1 92.4 94.8 94.8 92.1  PLT 50* 54* 58* 66* 70*   Basic Metabolic Panel: Recent Labs  Lab 07/25/23 0319 07/26/23 0636 07/27/23 0523 07/28/23 0448 07/29/23 0316 07/30/23 0315  NA 133* 136 134* 134* 134* 135  K 3.6 3.5 3.4* 3.7 3.9 3.8  CL 105 109 108 107 107 109  CO2 23 21* 21* 21* 22 22  GLUCOSE 126* 106* 102* 105* 131* 119*  BUN 14 10 12 11 12 11   CREATININE 0.70 0.67 0.70 0.64 0.64 0.65  CALCIUM 8.0* 8.0* 7.7* 7.9* 7.9* 8.2*  MG 1.6* 1.6* 1.7  --  1.5* 1.4*  PHOS 2.7 2.6 2.6 2.7 3.0 2.6    Studies: No results found.  Scheduled Meds:  vitamin C  500 mg Oral BID   bisacodyl  10 mg Oral BID   Chlorhexidine Gluconate Cloth  6 each Topical Daily   clotrimazole   Topical BID   feeding supplement  237 mL Oral TID BM   Gerhardt's butt cream   Topical TID   insulin aspart  0-6 Units Subcutaneous TID WC   lactulose  30 g Oral TID   Or   lactulose  300 mL Rectal TID   levothyroxine  100 mcg  Oral Q0600   [START ON 07/31/2023] magnesium oxide  400 mg Oral BID   multivitamin with minerals  1 tablet Oral Daily   mouth rinse  15 mL Mouth Rinse 4 times per day   pantoprazole  40 mg Oral BID   rifaximin  550 mg Oral BID   sodium chloride  1 g Oral BID WC   terazosin  5 mg Oral QHS   Continuous Infusions:  sodium chloride Stopped (07/28/23 1506)   levETIRAcetam Stopped (07/30/23 0957)   vancomycin Stopped (07/30/23 0618)   PRN Meds: acetaminophen, butalbital-acetaminophen-caffeine, hydrALAZINE, labetalol, mouth rinse, polyethylene glycol, polyvinyl alcohol  Time spent: 35 minutes  Author: Gillis Santa. MD [facility]  07/30/2023 3:09 PM  To reach On-call, see care teams to locate the attending and reach out to them via [email or web address]. If 7PM-7AM, please contact night-coverage If you still have difficulty reaching the attending provider, please page the [facility] (Director on Call) for [facility] on amion for assistance.

## 2023-07-30 NOTE — Consult Note (Signed)
PHARMACY CONSULT NOTE - ELECTROLYTES  Pharmacy Consult for Electrolyte Monitoring and Replacement   Recent Labs: Height: 6\' 2"  (188 cm) Weight: 107.7 kg (237 lb 7 oz) IBW/kg (Calculated) : 82.2 Estimated Creatinine Clearance: 99.5 mL/min (by C-G formula based on SCr of 0.65 mg/dL). Potassium (mmol/L)  Date Value  07/30/2023 3.8  05/01/2014 4.2   Magnesium (mg/dL)  Date Value  56/43/3295 1.4 (L)   Calcium (mg/dL)  Date Value  18/84/1660 8.2 (L)   Calcium, Total (mg/dL)  Date Value  63/12/6008 9.4   Albumin (g/dL)  Date Value  93/23/5573 2.3 (L)  05/01/2014 4.2   Phosphorus (mg/dL)  Date Value  22/01/5426 2.6   Sodium (mmol/L)  Date Value  07/30/2023 135  05/01/2014 136   Corrected Ca: 8.9 mg/dL  Assessment  ANTARES LOY is a 77 y.o. male presenting with SDH. PMH significant for NASH with liver cirrhosis, portal HTN, pancytopenia, OSA, s/p TAVR. Pharmacy has been consulted to monitor and replace electrolytes.  Na 134 low and stable, Mg 1.5 this AM needs replacement. All other electrolytes within normal limits.  Diet: dysphagia 1 MIVF: NS @ 75 mL/hr Pertinent medications: lactulose PO and enema, NaCl 1g PO BID  Goal of Therapy: Electrolytes WNL  Plan:  Continue PO Na supplementation as ordered Replace Mg with MgSulfate 4 gm IV x 1 Ca acceptable per corrected Ca Phos check twice weekly (Mo & Th) - changed back to daily by MD Check CMP, Mg with AM labs  Thank you for allowing pharmacy to be a part of this patient's care.   Rodriguez-Guzman PharmD, BCPS 07/30/2023 8:49 AM

## 2023-07-30 NOTE — Progress Notes (Signed)
Subjective: slightly more awake today again  Exam: Vitals:   07/30/23 0845 07/30/23 1225  BP: 134/66 (!) 90/45  Pulse: 65 65  Resp: 18 15  Temp: 98.4 F (36.9 C) 98.8 F (37.1 C)  SpO2: 97% 95%   Gen: In bed, NAD Resp: non-labored breathing, no acute distress Abd: soft, nt  Neuro: MS: He awakens to voice, able to tell me his month, year, name. CN: Endorses seeing fingers wiggle in both hemifield's, crosses midline in both directions Motor: He will lift up his arms and hold them against gravity today, wiggles toes bilaterally.  Sensory: Endorses some sensation  Pertinent Labs: Ammonia 67   Impression: 77 year old male with persistent encephalopathy in the setting of bilateral subdural hematomas, cirrhosis secondary to [facility] with hyperammonemia, MRSE on blood cultures.  My suspicion is that his worsening is multifactorial secondary to his liver disease, bacteremia, in the setting of underlying CNS pathology (SDH).  There is possible Keppra is contributing, and reasonable to titrate down on this.  His CT showed stability since August 7, however there was accumulation between the scan of August 5 and August 7.  I have asked neurosurgery to comment on if there are any other treatment options for him.  Recommendations: 1) continue Keppra 250 twice daily 2) lactulose per IM, ensure ammonia continues to trend down 3) appreciate neurosurgery assistance  Ritta Slot, MD [facility] [phone]  If 7pm- 7am, please page neurology on call as listed in [facility].

## 2023-07-30 NOTE — Progress Notes (Signed)
Physical Therapy Treatment Patient Details Name: JAVONI DESANTOS MRN: [id number] DOB: 06-27-1946 Today's Date: 07/30/2023   History of Present Illness Pt is a 77 year old male admitted with acute bilateral subdural hematomas s/p bilateral frontal burr hole washout, acute metabolic encephalopathy;    PMH significant for liver cirrhosis, portal HTN, TIPS, GIB/GAVE, pancytopenia, OSA, TAVR, left shoulder replacement, multiple episodes of bacteremia requiring prolonged IV abx treatment.    PT Comments  Pt lethargic but would open eyes with minimal verbal stimulation.  Pt presented with very little verbal communication during the session with only 2-3 instances of uttering one word responses to questions noted. Pt required +2 total assist to get to sitting at the EOB and once in sitting required near constant min A to maintain static sitting balance. Without support pt drifted slowly but steadily posteriorly requiring min to mod A to correct back to neutral.  Pt able to follow only a few one step commands with extra time and cuing such as moving UEs and shifting weight is sitting but was unable to follow cues for bed mobility tasks or to move either LE.  Pt will benefit from continued PT services upon discharge to safely address deficits listed in patient problem list for decreased caregiver assistance and eventual return to PLOF.        If plan is discharge home, recommend the following: Two people to help with walking and/or transfers;Two people to help with bathing/dressing/bathroom;Assist for transportation;Direct supervision/assist for medications management;Assistance with feeding;Direct supervision/assist for financial management;Assistance with cooking/housework   Can travel by private vehicle     No  Equipment Recommendations  None recommended by PT    Recommendations for Other Services       Precautions / Restrictions Precautions Precautions: Fall Restrictions Weight Bearing  Restrictions: No     Mobility  Bed Mobility Overal bed mobility: Needs Assistance       Supine to sit: Total assist, HOB elevated, +2 for physical assistance Sit to supine: Total assist, +2 for physical assistance, HOB elevated   General bed mobility comments: Little to no assistance from patient during sup to/from sit    Transfers                   General transfer comment: unable/unsafe to attempt    Ambulation/Gait                   Stairs             Wheelchair Mobility     Tilt Bed    Modified Rankin (Stroke Patients Only)       Balance Overall balance assessment: Needs assistance   Sitting balance-Leahy Scale: Poor Sitting balance - Comments: Near constant min A to maintain static sitting balance; without support pt drifted slowly but steadily posteriorly requiring min to mod A to correct back to neutral                                    Cognition Arousal: Lethargic Behavior During Therapy: Flat affect Overall Cognitive Status: No family/caregiver present to determine baseline cognitive functioning                                 General Comments: Max multimodal cuing to open eyes with minimal verbal communication consisting of 2-3 one word responses to questions  during the session and limited ability to follow commands        Exercises Other Exercises Other Exercises: Static sitting balance training and core strengthening at the EOB x 8 min    General Comments        Pertinent Vitals/Pain Pain Assessment Pain Assessment: PAINAD Breathing: normal Negative Vocalization: none Facial Expression: smiling or inexpressive Body Language: relaxed Consolability: no need to console PAINAD Score: 0    Home Living                          Prior Function            PT Goals (current goals can now be found in the care plan section) Progress towards PT goals: Not progressing toward goals  - comment (limited by lethargy and functional weakness)    Frequency    Min 1X/week      PT Plan      Co-evaluation              AM-PAC PT "6 Clicks" Mobility   Outcome Measure  Help needed turning from your back to your side while in a flat bed without using bedrails?: Total Help needed moving from lying on your back to sitting on the side of a flat bed without using bedrails?: Total Help needed moving to and from a bed to a chair (including a wheelchair)?: Total Help needed standing up from a chair using your arms (e.g., wheelchair or bedside chair)?: Total Help needed to walk in hospital room?: Total Help needed climbing 3-5 steps with a railing? : Total 6 Click Score: 6    End of Session   Activity Tolerance: Patient limited by lethargy Patient left: in bed;with call bell/phone within reach;with bed alarm set (Pt left with OT for start of OT session) Nurse Communication: Mobility status PT Visit Diagnosis: Muscle weakness (generalized) (M62.81);Other abnormalities of gait and mobility (R26.89);Difficulty in walking, not elsewhere classified (R26.2)     Time: 1610-9604 PT Time Calculation (min) (ACUTE ONLY): 19 min  Charges:    $Therapeutic Activity: 8-22 mins PT General Charges $$ ACUTE PT VISIT: 1 Visit                     D. Elly Modena PT, DPT 07/30/23, 4:49 PM

## 2023-07-31 ENCOUNTER — Inpatient Hospital Stay (HOSPITAL_COMMUNITY)
Admit: 2023-07-31 | Discharge: 2023-07-31 | Disposition: A | Discharge status: Home / Self Care | Payer: No Typology Code available for payment source | Attending: Student | Admitting: Student

## 2023-07-31 DIAGNOSIS — Z952 Presence of prosthetic heart valve: Secondary | ICD-10-CM

## 2023-07-31 DIAGNOSIS — S065XAA Traumatic subdural hemorrhage with loss of consciousness status unknown, initial encounter: Secondary | ICD-10-CM | POA: Diagnosis not present

## 2023-07-31 DIAGNOSIS — G9340 Encephalopathy, unspecified: Secondary | ICD-10-CM | POA: Diagnosis not present

## 2023-07-31 LAB — BASIC METABOLIC PANEL
Anion gap: 5 (ref 5–15)
BUN: 12 mg/dL (ref 8–23)
CO2: 22 mmol/L (ref 22–32)
Calcium: 8.1 mg/dL — ABNORMAL LOW (ref 8.9–10.3)
Chloride: 106 mmol/L (ref 98–111)
Creatinine, Ser: 0.63 mg/dL (ref 0.61–1.24)
GFR, Estimated: >60 mL/min (ref >60)
Glucose, Bld: 139 mg/dL — ABNORMAL HIGH (ref 70–99)
Potassium: 3.8 mmol/L (ref 3.5–5.1)
Sodium: 133 mmol/L — ABNORMAL LOW (ref 135–145)

## 2023-07-31 LAB — ECHOCARDIOGRAM COMPLETE
AR max vel: 1.29 cm2
AV Area VTI: 1.4 cm2
AV Area mean vel: 1.22 cm2
AV Mean grad: 12.7 mmHg
AV Peak grad: 25.1 mmHg
Ao pk vel: 2.5 m/s
Area-P 1/2: 1.72 cm2
Height: 74 in
MV VTI: 1.43 cm2
S' Lateral: 3.5 cm
Weight: 3802.49 [oz_av]

## 2023-07-31 LAB — CBC WITH DIFFERENTIAL/PLATELET
Abs Immature Granulocytes: 0.02 10*3/uL (ref 0.00–0.07)
Basophils Absolute: 0.1 10*3/uL (ref 0.0–0.1)
Basophils Relative: 1 %
Eosinophils Absolute: 0.1 10*3/uL (ref 0.0–0.5)
Eosinophils Relative: 4 %
HCT: 28.3 % — ABNORMAL LOW (ref 39.0–52.0)
Hemoglobin: 10.2 g/dL — ABNORMAL LOW (ref 13.0–17.0)
Immature Granulocytes: 1 %
Lymphocytes Relative: 17 %
Lymphs Abs: 0.6 10*3/uL — ABNORMAL LOW (ref 0.7–4.0)
MCH: 33.4 pg (ref 26.0–34.0)
MCHC: 36 g/dL (ref 30.0–36.0)
MCV: 92.8 fL (ref 80.0–100.0)
Monocytes Absolute: 0.4 10*3/uL (ref 0.1–1.0)
Monocytes Relative: 12 %
Neutro Abs: 2.4 10*3/uL (ref 1.7–7.7)
Neutrophils Relative %: 65 %
Platelets: 76 10*3/uL — ABNORMAL LOW (ref 150–400)
RBC: 3.05 MIL/uL — ABNORMAL LOW (ref 4.22–5.81)
RDW: 15.9 % — ABNORMAL HIGH (ref 11.5–15.5)
WBC: 3.6 10*3/uL — ABNORMAL LOW (ref 4.0–10.5)
nRBC: 0 % (ref 0.0–0.2)

## 2023-07-31 LAB — GLUCOSE, CAPILLARY
Glucose-Capillary: 135 mg/dL — ABNORMAL HIGH (ref 70–99)
Glucose-Capillary: 215 mg/dL — ABNORMAL HIGH (ref 70–99)
Glucose-Capillary: 112 mg/dL — ABNORMAL HIGH (ref 70–99)
Glucose-Capillary: 123 mg/dL — ABNORMAL HIGH (ref 70–99)

## 2023-07-31 LAB — AMMONIA: Ammonia: 54 umol/L — ABNORMAL HIGH (ref 9–35)

## 2023-07-31 LAB — MAGNESIUM: Magnesium: 1.7 mg/dL (ref 1.7–2.4)

## 2023-07-31 LAB — PHOSPHORUS: Phosphorus: 2.7 mg/dL (ref 2.5–4.6)

## 2023-07-31 MED ORDER — ACETAMINOPHEN 500 MG PO TABS: 500.0000 mg | ORAL_TABLET | Freq: Four times a day (QID) | ORAL | Status: DC | PRN

## 2023-07-31 MED ORDER — ENSURE ENLIVE PO LIQD: 237.0000 mL | Freq: Three times a day (TID) | ORAL | Status: DC

## 2023-07-31 MED ORDER — BUTALBITAL-APAP-CAFFEINE 50-325-40 MG PO TABS: 2.0000 | ORAL_TABLET | Freq: Three times a day (TID) | ORAL | Status: DC | PRN

## 2023-07-31 MED ORDER — VANCOMYCIN HCL 1500 MG/300ML IV SOLN: 1500.0000 mg | Freq: Two times a day (BID) | INTRAVENOUS | Status: DC

## 2023-07-31 MED ORDER — ADULT MULTIVITAMIN W/MINERALS CH: 1.0000 | ORAL_TABLET | Freq: Every day | ORAL | Status: DC

## 2023-07-31 MED ORDER — MAGNESIUM OXIDE -MG SUPPLEMENT 400 (240 MG) MG PO TABS: 400.0000 mg | ORAL_TABLET | Freq: Two times a day (BID) | ORAL | Status: DC

## 2023-07-31 MED ORDER — LEVETIRACETAM 250 MG PO TABS: 250.0000 mg | ORAL_TABLET | Freq: Two times a day (BID) | ORAL | Status: DC

## 2023-07-31 MED ORDER — PERFLUTREN LIPID MICROSPHERE
1.0000 mL | INTRAVENOUS | Status: AC | PRN
  Administered 2023-07-31: 5 mL via INTRAVENOUS
  Filled 2023-07-31: qty 10

## 2023-07-31 MED ORDER — ORAL CARE MOUTH RINSE: 15.0000 mL | OROMUCOSAL | Status: DC | PRN

## 2023-07-31 MED ORDER — BISACODYL 5 MG PO TBEC: 10.0000 mg | DELAYED_RELEASE_TABLET | Freq: Two times a day (BID) | ORAL | Status: DC

## 2023-07-31 MED ORDER — LEVETIRACETAM 250 MG PO TABS
250.0000 mg | ORAL_TABLET | Freq: Two times a day (BID) | ORAL | Status: DC
  Administered 2023-07-31 – 2023-08-01 (×2): 250 mg via ORAL
  Filled 2023-07-31 (×3): qty 1

## 2023-07-31 NOTE — Consult Note (Signed)
PHARMACY CONSULT NOTE - ELECTROLYTES  Pharmacy Consult for Electrolyte Monitoring and Replacement   Recent Labs: Height: 6\' 2"  (188 cm) Weight: 107.7 kg (237 lb 7 oz) IBW/kg (Calculated) : 82.2 Estimated Creatinine Clearance: 99.5 mL/min (by C-G formula based on SCr of 0.63 mg/dL). Potassium (mmol/L)  Date Value  07/31/2023 3.8  05/01/2014 4.2   Magnesium (mg/dL)  Date Value  16/09/9603 1.7   Calcium (mg/dL)  Date Value  54/08/8118 8.1 (L)   Calcium, Total (mg/dL)  Date Value  14/78/2956 9.4   Albumin (g/dL)  Date Value  21/30/8657 2.3 (L)  05/01/2014 4.2   Phosphorus (mg/dL)  Date Value  84/69/6295 2.7   Sodium (mmol/L)  Date Value  07/31/2023 133 (L)  05/01/2014 136   Corrected Ca: 8.9 mg/dL  Assessment  Joshua Berry is a 77 y.o. male presenting with SDH. PMH significant for NASH with liver cirrhosis, portal HTN, pancytopenia, OSA, s/p TAVR. Pharmacy has been consulted to monitor and replace electrolytes.  Na 134 low and stable, Mg 1.5 this AM needs replacement. All other electrolytes within normal limits.  Diet: dysphagia 1 MIVF: NS @ 75 mL/hr Pertinent medications: lactulose PO and enema, NaCl 1g PO BID  Goal of Therapy: Electrolytes WNL  Plan:  Continue PO Na supplementation as ordered No replacement warranted today  Phos check twice weekly (Mo & Th) - changed back to daily by MD Check CMP, Mg with AM labs  Thank you for allowing pharmacy to be a part of this patient's care.   Rodriguez-Guzman PharmD, BCPS 07/31/2023 9:11 AM

## 2023-07-31 NOTE — Progress Notes (Signed)
Occupational Therapy Treatment Patient Details Name: Joshua Berry MRN: [id number] DOB: 01-15-1946 Today's Date: 07/31/2023   History of present illness Pt is a 77 year old male admitted with acute bilateral subdural hematomas s/p bilateral frontal burr hole washout, acute metabolic encephalopathy;    PMH significant for liver cirrhosis, portal HTN, TIPS, GIB/GAVE, pancytopenia, OSA, TAVR, left shoulder replacement, multiple episodes of bacteremia requiring prolonged IV abx treatment.   OT comments  Pt seen for OT tx, limited by pt's lethargy/decreased alertness. Spouse present and supportive. Pt demonstrates limited response to noxious stimuli, requiring sternal rub for brief response with pt opening his eyes a couple times and nodded head slightly one time to simple question. Unable to follow commands. Grimaced when provided with deep pressure stimulus to feet. Repositioned with pillows for improved comfort and midline posture for pressure relief after pt was noted to be leaning slightly to L side. Placed rolled linens in R hand to maximize neutral/functional joint positioning. Pt continues to benefit from skilled OT services at this time.       If plan is discharge home, recommend the following:  Two people to help with walking and/or transfers;Two people to help with bathing/dressing/bathroom;Help with stairs or ramp for entrance   Equipment Recommendations  Other (comment) (defer)    Recommendations for Other Services      Precautions / Restrictions Precautions Precautions: Fall Restrictions Weight Bearing Restrictions: No       Mobility Bed Mobility               General bed mobility comments: TOTAL A for slight repositioning to improve upright midline posture for pressure relief    Transfers                         Balance                                           ADL either performed or assessed with clinical judgement   ADL Overall  ADL's : Needs assistance/impaired     Grooming: Wash/dry face;Bed level;Total assistance                                      Extremity/Trunk Assessment              Vision       Perception     Praxis      Cognition Arousal: Stuporous Behavior During Therapy: Flat affect Overall Cognitive Status: Difficult to assess                                 General Comments: MAX stimulation and VC cues to open eyes very briefly and nods head once to yes/no question but otherwise unresponsive        Exercises      Shoulder Instructions       General Comments      Pertinent Vitals/ Pain       Pain Assessment Pain Assessment: PAINAD Breathing: normal Negative Vocalization: none Facial Expression: facial grimacing Body Language: relaxed Consolability: no need to console PAINAD Score: 2 Pain Location: pt grimaced with sternal rub and repositioning of R arm (pt noting hx of R shoulder pain) Pain  Descriptors / Indicators: Grimacing Pain Intervention(s): Limited activity within patient's tolerance, Monitored during session  Home Living                                          Prior Functioning/Environment              Frequency  Min 1X/week        Progress Toward Goals  OT Goals(current goals can now be found in the care plan section)  Progress towards OT goals: OT to reassess next treatment  Acute Rehab OT Goals Patient Stated Goal: pt unable to participate OT Goal Formulation: Patient unable to participate in goal setting Time For Goal Achievement: 08/09/23 Potential to Achieve Goals: Fair  Plan      Co-evaluation                 AM-PAC OT "6 Clicks" Daily Activity     Outcome Measure   Help from another person eating meals?: A Lot Help from another person taking care of personal grooming?: Total Help from another person toileting, which includes using toliet, bedpan, or urinal?:  Total Help from another person bathing (including washing, rinsing, drying)?: Total Help from another person to put on and taking off regular upper body clothing?: A Lot Help from another person to put on and taking off regular lower body clothing?: A Lot 6 Click Score: 9    End of Session    OT Visit Diagnosis: Other abnormalities of gait and mobility (R26.89);Muscle weakness (generalized) (M62.81)   Activity Tolerance Patient limited by lethargy   Patient Left in bed;with call bell/phone within reach;with bed alarm set;with family/visitor present   Nurse Communication          Time: 4540-9811 OT Time Calculation (min): 14 min  Charges: OT General Charges $OT Visit: 1 Visit OT Treatments $Therapeutic Activity: 8-22 mins  Arman Filter., MPH, MS, OTR/L ascom [phone] 07/31/23, 4:54 PM

## 2023-07-31 NOTE — Discharge Summary (Addendum)
[facility] Discharge Summary   [name] UEA:[id number]  PCP: [facility]  Date of admission: 07/14/2023   Date of discharge:  08/01/2023     Discharge Diagnoses:  Principal Problem:   Subdural hematoma (HCC) Active Problems:   Cirrhosis of liver without ascites (HCC)   Compression of brain (HCC)   Uncal herniation (HCC)   Acute nontraumatic intracranial subdural hematoma (HCC)   Staphylococcus epidermidis bacteremia   Admitted From: ALF Disposition: Transfer to [facility] for interventional neurosurgery embolization Case was discussed with neurosurgeon Dr. Janeece Agee who will consult on the patient Awaiting for transfer under hospitalist service.  Recommendations for Outpatient Follow-up:  Follow recommendation after discharge from [facility] Follow up LABS/TEST:     Diet recommendation: Dysphagia type 1 thin Liquid  Activity: The patient is advised to gradually reintroduce usual activities, as tolerated  Discharge Condition: stable  Code Status: DNR   History of present illness: As per the H and P dictated on admission Hospital Course:  Joshua Berry is a 77 yo M with hx of NASH with liver cirrhosis, portal HTN, TIPS, GIB/GAVE, pancytopenia, OSA, TAVR, left shoulder replacement, multiple episodes of bacteremia requiring prolonged IV abx treatment who presented to [facility] ED from [facility] assisted [facility] via EMS for evaluation of altered mental status.    ED workup revealed bilateral subdural hematomas with midline shift and signs of herniation. Family in agreement with surgical intervention. NSGY administered platelets and FFP to improve coagulopathy then brought patient to OR for hematoma evacuation.    Patient was in ICU managed by PCCM, then transferred to hospitalist service on 7/31. 07/21/23 he spiked fever, started on prophylactic meningitis treatment.  07/23/23 MRSE positive blood cultures. ID on board. Continue Vanco, stopped Cefepime. MRI brain rule out  abscess. Infected hematoma ordered. 07/24/23 MRI of the brain bilateral holohemispheric extra-axial collections measuring 9 mm, mass effect on both superior hemispheres.  Neurosurgery team suggested MRI findings not concerning.  Advised cEEG, neurology asked for follow-up. 8/9: EEG negative for any acute seizure 8/10: Transfer to PCU.  PT and OT eval 8/11: Patient more drowsy, cut back dose of Keppra from 1000 to 500 mg twice daily, ammonia elevated so ordered p.o. lactulose (as rectal may not be working/staying inside to have effect)  Patient was admitted on 07/14/2023 and I started taking care of this patient on 07/28/2023.  Please review prior notes for details.  Further management as below.  Assessment and Plan: # Acute Bilateral Subdural Hematomas  s/p bilateral frontal burr hole washout done on 7/29 Drains removed on 8/1. S/p plt and FFP.  No need for further supplementation. Staples removed on 8/8 by neurosurgery..  Sutures in place and the previous drain sites are dissolvable per neurosurgery Sodium 123 -> 134, continue salt tabs  MRI of the brain bilateral holohemispheric extra-axial collections measuring 9 mm, mass effect on both superior hemispheres. No intervention per Neuro surgery team. Discussed with Neurologist, EEG not showing any active seizure.  8/12 CT Head: Similar size of the bilateral subdural collections compared to the recent brain MRI, accounting for differences in modality. Unchanged 6 mm of leftward midline shift. 8/14 follow neurosurgery for further recommendation 8/15 neurosurgery recommended to transfer to [facility] under interventional neurosurgery for embolization due to reaccumulation of fluid. 8/16 patient got accepted by neurosurgeon Dr. Grace Isaac, will be transferred to [facility] today.   # Acute metabolic encephalopathy In the setting of subdural hematoma, hepatic encephalopathy, infection.  He has waxing and waning mental status  likely due to hyperammonemia and bilateral  hemispheric subdural collections On 8/5 Neuro was consulted for persistent somnolence.  EEG neg for seizure, Keppra decreased 1000 ->>500 ->> 250 mg IV bid AMS, fever 8/5- Started treatment for hepatic encephalopathy and meningitis. Rectal tube inserted due to skin breakdown. Continue lactulose enema QID.  Repeat ammonia 63-> 114.  Continue oral lactulose, continue rifaximin.  Concern is that patient may not be holding lactulose in in the rectum for 30 to 45 minutes to have actual effect of medicine Continue vancomycin for MRSE blood cultures.   Neurology following.  EEG negative for seizure. His repeat MRI 07/23/23 with mass effect on both sides may be the cause of his lethargy. Continue neurochecks.  Indwelling foley for urinary retention. 8/11: Patient much more drowsy today so ammonia checked which is elevated.  Lactulose ordered by mouth.  Cutting back Keppra dose to 500 mg twice daily.   Appreciate neurology reevaluation.   If his mentation does not improve neurology may consider Provigil or amitriptyline  If he is not able to tolerate oral lactulose may need to consider NG tube/Dobbhoff tube after talking with his GI doctor at 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 Neurosurgery, who deemed LP to carry too much risk. Decision made to start tx for meningitis empirically.  Continue IV vancomycin Given MRSE bacteremia, ID recommended to stop cefepime, continue vancomycin for now 8/13 repeat blood cultures was ordered due to fever, Bld Cx NGTD  8/14  TTE LVEF 60 to 65%, moderate LV hypertrophy, grade 1 diastolic dysfunction. S/p TAVR, no any other significant findings 8/15 as per ID patient may need vancomycin for 2 to 4 weeks  # Acute urinary  retention Foley reinsertion and care during the inpatient stay. Once his mental status improves will remove foley with voiding trial.   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 Chronic Leukopenia s/p plt and FFP. Continue lactulose, rifaximin therapy.  Continue to monitor neurochecks. Nursing supportive care. 8/13 increase lactulose 30 g p.o. 3 times daily, Dulcolax 10 mg x 1 dose given as well Ammonia level 38, gradually improving 8/14 started Dulcolax 10 mg p.o. twice daily for 3 days   # Frequent PVCs # Hypomagnesemia, mag repleted. Monitor electrolytes and replete Continue to monitor on telemetry     # OSA: Not compliant with CPAP. # Hypothyroidism: Continue home dose Synthroid # Type 2 Diabetes Mellitus: Sliding scale insulin as per floor protocol # GERD: Continue PPI therapy. # Dyslipidemia: Resume statin at discharge.   Body mass index is 30.51 kg/m.  Nutrition Problem: Inadequate oral intake Etiology: acute illness Nutrition Interventions:  Pressure Injury 06/17/23 Coccyx Mid Stage 1 -  Intact skin with non-blanchable redness of a localized area usually over a bony prominence. (Active)  06/17/23 0615  Location: Coccyx  Location Orientation: Mid  Staging: Stage 1 -  Intact skin with non-blanchable redness of a localized area usually over a bony prominence.  Wound Description (Comments):   Present on Admission:      Pressure Injury 07/18/23 Buttocks Left;Right Stage 1 -  Intact skin with non-blanchable redness of a localized area usually over a bony prominence. (Active)  07/18/23 1450  Location:  Buttocks  Location Orientation: Left;Right  Staging: Stage 1 -  Intact skin with non-blanchable redness of a localized area usually over a bony prominence.  Wound Description (Comments):   Present on Admission: Yes  Dressing Type Foam - Lift dressing to assess site every  shift 08/01/23 1200     Patient was seen by physical therapy, who recommended Therapy, SNF placement, which needs to be arranged when patient is stable to be discharged.  Patient is stable to transfer to [facility] for further management.  Consultants: Neurology, neurosurgery, ID, palliative care, Procedures: s/p bilateral frontal bur hole washout  Discharge Exam: General: Appear in no distress, no Rash; Oral Mucosa Clear, moist. Cardiovascular: S1 and S2 Present, no Murmur, Respiratory: normal respiratory effort, Bilateral Air entry present and no Crackles, no wheezes Abdomen: Bowel Sound present, Soft and no tenderness, no hernia Extremities: no Pedal edema, no calf tenderness Neurology: Sleepy, hard to wake up, following, and off-and-on.   affect appropriate.  Filed Weights   07/29/23 0704 07/30/23 0500 07/31/23 0706  Weight: 108.2 kg 107.7 kg 107.8 kg   Vitals:   08/01/23 0800 08/01/23 1148  BP:    Pulse:  75  Resp:    Temp: 98.6 F (37 C) 98.6 F (37 C)  SpO2: 98% 100%    DISCHARGE MEDICATION: Allergies as of 08/01/2023       Reactions   Camphor Swelling, Rash   Latex Rash   Lisinopril Cough   Other Reaction(s): Cough   Sulfamethoxazole Swelling   Chocolate    Family states he is allergic   Nsaids Other (See Comments)   NASH        Medication List     STOP taking these medications    Melatonin 10 MG Tabs       TAKE these medications    acetaminophen 500 MG tablet Commonly known as: TYLENOL Take 1 tablet (500 mg total) by mouth every 6 (six) hours as needed for moderate pain, fever, headache or mild pain (headache).   ascorbic acid 500 MG tablet Commonly known as: VITAMIN C Take 1,000 mg by mouth daily.   Aspercreme Lidocaine 4 % Liqd Generic drug: Lidocaine HCl Apply 1 Application topically daily as needed.   atorvastatin 20 MG tablet Commonly known as: LIPITOR Take 20 mg by mouth at bedtime.   augmented betamethasone dipropionate 0.05 %  cream Commonly known as: DIPROLENE-AF Apply 1 application topically 2 (two) times daily.   bisacodyl 5 MG EC tablet Commonly known as: DULCOLAX Take 2 tablets (10 mg total) by mouth 2 (two) times daily.   butalbital-acetaminophen-caffeine 50-325-40 MG tablet Commonly known as: FIORICET Take 2 tablets by mouth every 8 (eight) hours as needed for headache.   Cholecalciferol 50 MCG (2000 UT) Tabs Take 1 tablet by mouth daily.   clotrimazole 1 % external solution Commonly known as: LOTRIMIN Apply 1 Application topically 2 (two) times daily. Apply to toe nails   famotidine 20 MG tablet Commonly known as: PEPCID Take 20 mg by mouth daily.   feeding supplement Liqd Take 237 mLs by mouth 3 (three) times daily between meals.   Fish Oil 1000 MG Caps Take 1 capsule by mouth 2 (two) times daily.   folic acid 1 MG tablet Commonly known as: FOLVITE Take 1 tablet by mouth daily.   furosemide 20 MG tablet Commonly known as: LASIX Take 1 tablet (20 mg total) by mouth daily. What changed: additional instructions   insulin aspart 100 UNIT/ML injection Commonly known as:  novoLOG Inject 10 Units into the skin 3 (three) times daily before meals.   insulin glargine 100 UNIT/ML injection Commonly known as: LANTUS Inject 0.14 mLs (14 Units total) into the skin daily. What changed: how much to take   ketotifen 0.025 % ophthalmic solution Commonly known as: ZADITOR Place 1 drop into both eyes 2 (two) times daily.   lactulose 10 GM/15ML solution Commonly known as: CHRONULAC Take 45 mLs (30 g total) by mouth 3 (three) times daily. What changed:  how much to take when to take this additional instructions   levETIRAcetam 250 MG tablet Commonly known as: KEPPRA Take 1 tablet (250 mg total) by mouth 2 (two) times daily.   levothyroxine 100 MCG tablet Commonly known as: SYNTHROID Take 100 mcg by mouth daily before breakfast.   lidocaine 5 % Commonly known as: LIDODERM Place 2  patches onto the skin daily. Remove & Discard patch within 12 hours or as directed by MD   magnesium oxide 400 (240 Mg) MG tablet Commonly known as: MAG-OX Take 1 tablet (400 mg total) by mouth 2 (two) times daily.   magnesium oxide 400 MG tablet Commonly known as: MAG-OX Take 2 tablets (800 mg total) by mouth 2 (two) times daily.   multivitamin with minerals Tabs tablet Take 1 tablet by mouth daily.   pantoprazole 40 MG tablet Commonly known as: Protonix Take 1 tablet (40 mg total) by mouth 2 (two) times daily.   RA Probiotic Digestive Care Caps Take 1 capsule by mouth daily.   rifaximin 550 MG Tabs tablet Commonly known as: XIFAXAN Take 550 mg by mouth 2 (two) times daily. Takes with lactulose   Semaglutide(0.25 or 0.5MG /DOS) 2 MG/1.5ML Sopn Inject 0.5 mg into the skin once a week. Thursday   terazosin 5 MG capsule Commonly known as: HYTRIN Take 5 mg by mouth at bedtime.   trolamine salicylate 10 % cream Commonly known as: ASPERCREME Apply topically as needed for muscle pain.   vancomycin HCl 1500 MG/300ML Soln Commonly known as: VANCOREADY Inject 300 mLs (1,500 mg total) into the vein every 12 (twelve) hours.   zinc oxide 20 % ointment Apply 1 Application topically in the morning and at bedtime. APPLY SMALL AMOUNT TO AFFECTED AREA TWICE A DAY FOR BUTTOCK RASH FOR BUTTOCK RASH               Discharge Care Instructions  (From admission, onward)           Start     Ordered   07/31/23 0000  Discharge wound care:       Comments: As above   07/31/23 1715           Allergies  Allergen Reactions   Camphor Swelling and Rash   Latex Rash   Lisinopril Cough    Other Reaction(s): Cough   Sulfamethoxazole Swelling   Chocolate     Family states he is allergic   Nsaids Other (See Comments)    NASH   Discharge Instructions     Call MD for:  difficulty breathing, headache or visual disturbances   Complete by: As directed    Call MD for:  extreme  fatigue   Complete by: As directed    Call MD for:  persistant dizziness or light-headedness   Complete by: As directed    Call MD for:  persistant nausea and vomiting   Complete by: As directed    Call MD for:  severe uncontrolled pain   Complete by: As  directed    Call MD for:  temperature >100.4   Complete by: As directed    Diet - low sodium heart healthy   Complete by: As directed    Discharge instructions   Complete by: As directed    Follow-up discharge instructions after discharge from [facility]   Discharge wound care:   Complete by: As directed    As above   Increase activity slowly   Complete by: As directed        The results of significant diagnostics from this hospitalization (including imaging, microbiology, ancillary and laboratory) are listed below for reference.    Significant Diagnostic Studies: ECHOCARDIOGRAM COMPLETE  Result Date: 07/31/2023    ECHOCARDIOGRAM REPORT   Patient Name:   MONTRELLE BALZER Hockenberry Date of Exam: 07/31/2023 Medical Rec #:  [id number]     Height:       74.0 in Accession #:    [id number]    Weight:       237.7 lb Date of Birth:  [birth year]     BSA:          2.339 m Patient Age:    21 years      BP:           125/68 mmHg Patient Gender: M             HR:           76 bpm. Exam Location:  [facility] Procedure: 2D Echo, Cardiac Doppler, Color Doppler and Intracardiac            Opacification Agent Indications:     Post TAVR evaluation  History:         Patient has prior history of Echocardiogram examinations, most                  recent 04/09/2023. CHF, Signs/Symptoms:Chest Pain, Altered                  Mental Status and Bacteremia; Risk Factors:Hypertension,                  Diabetes and Dyslipidemia. 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  Sonographer:     Mikki Harbor Referring Phys:  [id number] Gillis Santa Diagnosing Phys: Julien Nordmann MD  Sonographer Comments: Technically difficult study due to poor echo windows and  suboptimal apical window. IMPRESSIONS  1. Left ventricular ejection fraction, by estimation, is 60 to 65%. The left ventricle has normal function. The left ventricle has no regional wall motion abnormalities. There is moderate left ventricular hypertrophy. Left ventricular diastolic parameters are consistent with Grade I diastolic dysfunction (impaired relaxation).  2. Right ventricular systolic function is normal. The right ventricular size is normal. There is normal pulmonary artery systolic pressure. The estimated right ventricular systolic pressure is 34.6 mmHg.  3. Left atrial size was mildly dilated.  4. The mitral valve is normal in structure. No evidence of mitral valve regurgitation. No evidence of mitral stenosis. Moderate mitral annular calcification.  5. The aortic valve has been repaired/replaced, bioprosthetic valve, s/p TAVR. Aortic valve regurgitation is not visualized. Aortic valve mean gradient measures 12.7 mmHg.  6. The inferior vena cava is normal in size with greater than 50% respiratory variability, suggesting right atrial pressure of 3 mmHg. FINDINGS  Left Ventricle: Left ventricular ejection fraction, by estimation, is 60 to 65%. The left ventricle  has normal function. The left ventricle has no regional wall motion abnormalities. Definity contrast agent was given IV to delineate the left ventricular  endocardial borders. The left ventricular internal cavity size was normal in size. There is moderate left ventricular hypertrophy. Left ventricular diastolic parameters are consistent with Grade I diastolic dysfunction (impaired relaxation). Right Ventricle: The right ventricular size is normal. No increase in right ventricular wall thickness. Right ventricular systolic function is normal. There is normal pulmonary artery systolic pressure. The tricuspid regurgitant velocity is 2.58 m/s, and  with an assumed right atrial pressure of 8 mmHg, the estimated right ventricular systolic pressure is  34.6 mmHg. Left Atrium: Left atrial size was mildly dilated. Right Atrium: Right atrial size was normal in size. Pericardium: There is no evidence of pericardial effusion. Mitral Valve: The mitral valve is normal in structure. Moderate mitral annular calcification. No evidence of mitral valve regurgitation. No evidence of mitral valve stenosis. MV peak gradient, 9.1 mmHg. The mean mitral valve gradient is 4.0 mmHg. Tricuspid Valve: The tricuspid valve is normal in structure. Tricuspid valve regurgitation is mild . No evidence of tricuspid stenosis. Aortic Valve: The aortic valve has been repaired/replaced. Aortic valve regurgitation is not visualized. Aortic valve sclerosis is present, with no evidence of aortic valve stenosis. Aortic valve mean gradient measures 12.7 mmHg. Aortic valve peak gradient measures 25.1 mmHg. Aortic valve area, by VTI measures 1.40 cm. 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 Pulmonic Valve: The pulmonic valve was normal in structure. Pulmonic valve regurgitation is not visualized. No evidence of pulmonic stenosis. Aorta: The aortic root is normal in size and structure. Venous: The inferior vena cava is normal in size with greater than 50% respiratory variability, suggesting right atrial pressure of 3 mmHg. IAS/Shunts: No atrial level shunt detected by color flow Doppler.  LEFT VENTRICLE PLAX 2D LVIDd:         5.40 cm   Diastology LVIDs:         3.50 cm   LV e' medial:    7.51 cm/s LV PW:         1.30 cm   LV E/e' medial:  17.8 LV IVS:        1.50 cm   LV e' lateral:   10.90 cm/s LVOT diam:     2.00 cm   LV E/e' lateral: 12.3 LV SV:         72 LV SV Index:   31 LVOT Area:     3.14 cm  RIGHT VENTRICLE RV Basal diam:  4.90 cm RV S prime:     16.60 cm/s LEFT ATRIUM           Index        RIGHT ATRIUM           Index LA diam:      4.70 cm 2.01 cm/m   RA Area:     24.50 cm LA Vol (A4C): 63.0 ml 26.94 ml/m  RA Volume:   69.60 ml  29.76 ml/m  AORTIC VALVE                      PULMONIC VALVE AV Area (Vmax):    1.29 cm      PV Vmax:       1.47 m/s AV Area (Vmean):   1.22 cm      PV Peak grad:  8.6 mmHg AV Area (VTI):     1.40 cm AV Vmax:  250.33 cm/s AV Vmean:          160.000 cm/s AV VTI:            0.513 m AV Peak Grad:      25.1 mmHg AV Mean Grad:      12.7 mmHg LVOT Vmax:         102.50 cm/s LVOT Vmean:        62.250 cm/s LVOT VTI:          0.228 m LVOT/AV VTI ratio: 0.44  AORTA Ao Root diam: 3.60 cm MITRAL VALVE                TRICUSPID VALVE MV Area (PHT): 1.72 cm     TR Peak grad:   26.6 mmHg MV Area VTI:   1.43 cm     TR Vmax:        258.00 cm/s MV Peak grad:  9.1 mmHg MV Mean grad:  4.0 mmHg     SHUNTS MV Vmax:       1.51 m/s     Systemic VTI:  0.23 m MV Vmean:      88.8 cm/s    Systemic Diam: 2.00 cm MV Decel Time: 440 msec MV E velocity: 134.00 cm/s MV A velocity: 153.00 cm/s MV E/A ratio:  0.88 Julien Nordmann MD Electronically signed by Julien Nordmann MD Signature Date/Time: 07/31/2023/4:48:21 PM    Final    CT HEAD WO CONTRAST ( )  Result Date: 07/28/2023 CLINICAL DATA:  Mental status change, unknown cause. EXAM: CT HEAD WITHOUT CONTRAST TECHNIQUE: Contiguous axial images were obtained from the base of the skull through the vertex without intravenous contrast. RADIATION DOSE REDUCTION: This exam was performed according to the departmental dose-optimization program which includes automated exposure control, adjustment of the mA and/or kV according to patient size and/or use of iterative reconstruction technique. COMPARISON:  Head CT 07/21/2023.  Brain MRI 07/23/2023. FINDINGS: Brain: Similar size of the bilateral subdural collections compared to the recent brain MRI, accounting for differences in modality. Unchanged 6 mm of leftward midline shift. Continued decrease in pneumocephalus. No hydrocephalus. Basilar cisterns are patent. Vascular: No hyperdense vessel or unexpected calcification. Skull: Unchanged postoperative appearance from recent bifrontal  burr holes. Sinuses/Orbits: No acute findings. Other: None. IMPRESSION: Similar size of the bilateral subdural collections compared to the recent brain MRI, accounting for differences in modality. Unchanged 6 mm of leftward midline shift. Electronically Signed   By: Orvan Falconer M.D.   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 EEG to evaluate for seizure Level of alertness: Awake  AEDs during EEG study: LEV  Technical aspects: This EEG study was done with scalp electrodes positioned according to the 10-20 International system of electrode placement. Electrical activity was reviewed with band pass filter of 1-70Hz , sensitivity of 7 uV/mm, display speed of 52mm/sec with a 60Hz  notched filter applied as appropriate. EEG data were recorded continuously and digitally stored.  Video monitoring was available and  reviewed as appropriate.  Description: EEG showed continuous generalized 3 to 6 Hz theta-delta slowing. Hyperventilation and photic stimulation were not performed.    Of note, EEG was technically difficult due to significant myogenic artifact.  ABNORMALITY - Continuous slow, generalized  IMPRESSION: This technically difficult  study is suggestive of moderate to severe diffuse encephalopathy, nonspecific etiology. No seizures or epileptiform discharges were seen throughout the recording.  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 Level of alertness: Awake AEDs during EEG study: LEV Technical aspects: This EEG study was done with scalp electrodes positioned according to the 10-20 International system of electrode placement. Electrical activity was reviewed with band pass filter of 1-70Hz , sensitivity of 7 uV/mm, display speed of 51mm/sec with a 60Hz  notched filter applied as appropriate. EEG data were recorded continuously and digitally stored.  Video monitoring was available and reviewed as appropriate. Description: EEG showed continuous generalized 3 to 6 Hz theta-delta slowing. Hyperventilation and photic stimulation were not performed.   Of note, EEG was technically difficult due to significant myogenic artifact. ABNORMALITY - Continuous slow, generalized IMPRESSION: This technically difficult  study is suggestive of moderate to severe diffuse encephalopathy, nonspecific etiology. No seizures or epileptiform discharges were seen throughout the recording. Priyanka Annabelle Harman   US Venous Img Upper Uni Right(DVT)  Result Date: 07/24/2023 CLINICAL DATA:  Right upper extremity swelling. EXAM: RIGHT UPPER EXTREMITY VENOUS DOPPLER ULTRASOUND TECHNIQUE: Gray-scale sonography with graded compression, as well as color Doppler and duplex ultrasound were performed to evaluate the upper extremity deep venous system from the level of the subclavian vein and including the jugular, axillary, basilic, radial, ulnar and upper cephalic vein.  Spectral Doppler was utilized to evaluate flow at rest and with distal augmentation maneuvers. COMPARISON:  None Available. FINDINGS: Contralateral Subclavian Vein: Respiratory phasicity is normal and symmetric with the symptomatic side. No evidence of thrombus. Normal compressibility. Internal Jugular Vein: No evidence of thrombus. Normal compressibility, respiratory phasicity and response to augmentation. Subclavian Vein: No evidence of thrombus. Normal compressibility, respiratory phasicity and response to augmentation. Axillary Vein: No evidence of thrombus. Normal compressibility, respiratory phasicity and response to augmentation. Cephalic Vein: No evidence of thrombus. Normal compressibility, respiratory phasicity and response to augmentation. Basilic Vein: No evidence of thrombus. Normal compressibility, respiratory phasicity and response to augmentation. Brachial Veins: No evidence of thrombus. Normal compressibility, respiratory phasicity and response to augmentation. Radial Veins: No evidence of thrombus. Normal compressibility, respiratory phasicity and response to augmentation. Ulnar Veins: No evidence of thrombus. Normal compressibility, respiratory phasicity and response to augmentation. Venous Reflux:  None visualized. Other Findings:  None visualized. IMPRESSION: No evidence of DVT within the RIGHT upper extremity. Electronically Signed   By: Aram Candela M.D.   On: 07/24/2023 01:36   MR BRAIN W WO CONTRAST  Result Date: 07/23/2023 CLINICAL DATA:  ALTERED MENTAL STATUS EXAM: MRI HEAD WITHOUT AND WITH CONTRAST TECHNIQUE: Multiplanar, multiecho pulse sequences of the brain and surrounding structures  were obtained without and with intravenous contrast. CONTRAST:  10mL GADAVIST GADOBUTROL 1 MMOL/ML IV SOLN COMPARISON:  04/05/2023 brain MRI 07/21/2023 head CT FINDINGS: Brain: There are bilateral holo hemispheric extra-axial collections, measuring 9 mm on the right and 8 mm on the left. There are  areas of diffusion restriction within the collection, which may be due to the presence of blood clot or field inhomogeneity caused by pneumocephalus. Moderate anterior pneumocephalus. No acute infarct. Mass effect on both superior hemispheres. No midline shift. No hydrocephalus. The midline structures are normal. There is multifocal hyperintense T2-weighted signal within the periventricular and deep white matter. No abnormal contrast enhancement. Vascular: Major flow voids are preserved. Skull and upper cervical spine: Bifrontal burr holes. Sinuses/Orbits:No paranasal sinus fluid levels or advanced mucosal thickening. No mastoid or middle ear effusion. Normal orbits. IMPRESSION: 1. Bilateral holohemispheric extra-axial collections, measuring 9 mm on the right and 8 mm on the left. 2. Mass effect on both superior hemispheres. No midline shift. 3. No specific features of infection. Electronically Signed   By: Deatra Robinson M.D.   On: 07/23/2023 23:55   DG Chest Port 1 View  Result Date: 07/22/2023 CLINICAL DATA:  77 year old male with fever and sepsis. EXAM: PORTABLE CHEST 1 VIEW COMPARISON:  Portable chest 07/15/2023 and earlier. FINDINGS: Portable AP semi upright view at 0936 hours. Slightly lower lung volumes. Stable cardiac size and mediastinal contours. Sequelae of TAVR. Increased pulmonary interstitium which most resembles vascular congestion appears unchanged since 07/15/2023. No pneumothorax, pleural effusion or confluent lung opacity. Partially visible left shoulder arthroplasty. No acute osseous abnormality identified. Visualized tracheal air column is within normal limits. IMPRESSION: Lower lung volumes with ongoing interstitial opacity favored due to vascular congestion. Mild interstitial edema is possible but no pneumonia or pleural effusion is identified. Electronically Signed   By: Odessa Fleming M.D.   On: 07/22/2023 12:56   CT HEAD WO CONTRAST ( )  Result Date: 07/21/2023 CLINICAL DATA:  Lethargy,  postop EXAM: CT HEAD WITHOUT CONTRAST TECHNIQUE: Contiguous axial images were obtained from the base of the skull through the vertex without intravenous contrast. RADIATION DOSE REDUCTION: This exam was performed according to the departmental dose-optimization program which includes automated exposure control, adjustment of the mA and/or kV according to patient size and/or use of iterative reconstruction technique. COMPARISON:  07/20/2023 FINDINGS: Brain: Unchanged appearance of bifrontal pneumocephalus and bilateral hemispheric, predominantly isodense extra-axial collections. Small amount of hyperdense blood bilaterally is unchanged. No midline shift or other mass effect. Vascular: No hyperdense vessel or unexpected calcification. Skull: Right parietal and left frontal burr holes. Sinuses/Orbits: No acute finding. Other: None. IMPRESSION: Unchanged appearance of bifrontal pneumocephalus and bilateral hemispheric, predominantly isodense subdural hematomas. Electronically Signed   By: Deatra Robinson M.D.   On: 07/21/2023 21:21   EEG adult  Result Date: 07/21/2023 Jefferson Fuel, MD     07/21/2023  4:39 PM Routine EEG Report Joshua Berry is a 77 y.o. male with a history of seizure who is undergoing an EEG to evaluate for seizures. Report: This EEG was acquired with electrodes placed according to the International 10-20 electrode system (including Fp1, Fp2, F3, F4, C3, C4, P3, P4, O1, O2, T3, T4, T5, T6, A1, A2, Fz, Cz, Pz). The following electrodes were missing or displaced: none. The occipital dominant rhythm was 3-5 Hz. This activity is reactive to stimulation. Drowsiness was manifested by background fragmentation; deeper stages of sleep were identified by K complexes and sleep spindles. There was no focal slowing. There  were no interictal epileptiform discharges. There were no electrographic seizures identified. Photic stimulation and hyperventilation were not performed. Impression and clinical correlation: This  EEG was obtained while awake and asleep and is abnormal due to moderate to severe diffuse slowing indicative of global cerebral dysfunction. Epileptiform abnormalities were not seen during this recording. Bing Neighbors, MD [facility] [phone] If 7pm- 7am, please page neurology on call as listed in AMION.   CT HEAD WO CONTRAST ( )  Result Date: 07/20/2023 CLINICAL DATA:  Initial evaluation for headache. EXAM: CT HEAD WITHOUT CONTRAST TECHNIQUE: Contiguous axial images were obtained from the base of the skull through the vertex without intravenous contrast. RADIATION DOSE REDUCTION: This exam was performed according to the departmental dose-optimization program which includes automated exposure control, adjustment of the mA and/or kV according to patient size and/or use of iterative reconstruction technique. COMPARISON:  Prior CT from 07/16/2023. FINDINGS: Brain: Postoperative changes from prior bilateral burr hole craniotomy for subdural evacuation. Postoperative pneumocephalus has decreased from prior. Residual bilateral subdural hematomas are similar measuring up to approximately 9 mm bilaterally. Similar extension along the falx and tentorium. No evidence for significant interval bleeding. No significant midline shift. Basilar cisterns remain patent. No other acute intracranial hemorrhage. No large vessel territory infarct. No mass lesion. No hydrocephalus. Vascular: No abnormal hyperdense vessel. Scattered calcified atherosclerosis present about the skull base. Skull: Prior bilateral burr hole craniotomy. Skin staples remain in place. Sinuses/Orbits: Globes and orbital soft tissues demonstrate no acute finding. Paranasal sinuses remain largely clear. No significant mastoid effusion. Other: None. IMPRESSION: 1. Postoperative changes from prior bilateral burr hole craniotomy for subdural evacuation. Residual bilateral subdural hematomas are similar measuring up to 9 mm bilaterally. No evidence  for significant interval bleeding. No significant midline shift. 2. No other new acute intracranial abnormality. Electronically Signed   By: Rise Mu M.D.   On: 07/20/2023 20:03   EEG adult  Result Date: 07/16/2023 Jefferson Fuel, MD     07/20/2023  7:11 PM Routine EEG Report Joshua Berry is a 77 y.o. male with a history of altered mental status who is undergoing an EEG to evaluate for seizures. Report: This EEG was acquired with electrodes placed according to the International 10-20 electrode system (including Fp1, Fp2, F3, F4, C3, C4, P3, P4, O1, O2, T3, T4, T5, T6, A1, A2, Fz, Cz, Pz). The following electrodes were missing or displaced: none. The occipital dominant rhythm was 4-5 Hz with occasional triphasic waves. This activity is reactive to stimulation. Drowsiness was manifested by background fragmentation; deeper stages of sleep were identified by K complexes and sleep spindles. There was no focal slowing. There were no interictal epileptiform discharges. There were no electrographic seizures identified. Photic stimulation and hyperventilation were not performed. Impression and clinical correlation: This EEG was obtained while awake and asleep and is abnormal due to moderate diffuse slowing indicative of global cerebral dysfunction and occasional triphasic waves indicative of metabolic encephalopathy. Epileptiform abnormalities were not seen during this recording. Bing Neighbors, MD [facility] [phone] If 7pm- 7am, please page neurology on call as listed in AMION.   CT HEAD WO CONTRAST ( )  Result Date: 07/16/2023 CLINICAL DATA:  Mental status change of unknown cause. Follow-up subdural hematoma. EXAM: CT HEAD WITHOUT CONTRAST TECHNIQUE: Contiguous axial images were obtained from the base of the skull through the vertex without intravenous contrast. RADIATION DOSE REDUCTION: This exam was performed according to the departmental dose-optimization program which includes  automated exposure control, adjustment of the mA  and/or kV according to patient size and/or use of iterative reconstruction technique. COMPARISON:  07/15/2023 FINDINGS: Brain: The amount of intracranial air is stable or slightly diminished. Both subdural drains have been removed. On the right, the subdural hematoma is stable, maximal thickness 7 mm. On the left, the subdural blood and fluid is overall stable, but there is a slight increase in acute subdural blood at about the level of the left-sided burr hole. If this does not continue to increase, it is probably not significant, but certainly this should be watched. There is left-to-right shift of 1-2 mm. No hydrocephalus. No intraparenchymal hemorrhage. Subdural blood along the tentorium is stable. Vascular: No new vascular finding. Skull: No new skull finding. Sinuses/Orbits: Clear/normal Other: None IMPRESSION: 1. Both subdural drains have been removed. The amount of intracranial air is stable or slightly diminished. 2. On the right, the subdural hematoma is stable, maximal thickness 7 mm. 3. On the left, the subdural blood and fluid is overall stable, but there is a slight increase in acute subdural blood at about the level of the left-sided burr hole. If this does not continue to increase, it is probably not significant, but certainly this should be watched. There is left-to-right shift of 1-2 mm. Electronically Signed   By: Paulina Fusi M.D.   On: 07/16/2023 11:07   DG Chest Port 1 View  Result Date: 07/15/2023 CLINICAL DATA:  Subdural hematoma, endotracheal tube EXAM: PORTABLE CHEST 1 VIEW COMPARISON:  07/14/2023 FINDINGS: 2 frontal views of the chest are obtained. There is no endotracheal tube identified within the visualized portions of the neck and chest. Aortic valve prosthesis again noted. Cardiac silhouette is unremarkable. Slight increase in pulmonary vascular congestion, without focal airspace disease, effusion, or pneumothorax. No acute bony  abnormality. IMPRESSION: 1. Endotracheal tube, if present, is not identified within the visualized portions of the neck and chest. 2. Pulmonary vascular congestion without overt edema. These results will be called to the ordering clinician or representative by the Radiologist Assistant, and communication documented in the PACS or [employer]. Electronically Signed   By: Sharlet Salina M.D.   On: 07/15/2023 17:55   CT HEAD WO CONTRAST ( )  Result Date: 07/15/2023 CLINICAL DATA:  Subdural hemorrhage.  Subdural hematoma.  Follow-up. EXAM: CT HEAD WITHOUT CONTRAST TECHNIQUE: Contiguous axial images were obtained from the base of the skull through the vertex without intravenous contrast. RADIATION DOSE REDUCTION: This exam was performed according to the departmental dose-optimization program which includes automated exposure control, adjustment of the mA and/or kV according to patient size and/or use of iterative reconstruction technique. COMPARISON:  07/14/2023 FINDINGS: Brain: Interval bilateral frontoparietal vertex burr holes and placement of bilateral subdural drains. Right-sided drain extends to the floor of the middle cranial fossa. Left-sided drain extends to the parieto-occipital region posteriorly. The amount of subdural blood and fluid is decreased on both sides, more so on the right than the left. Subdural air is present in the non dependent frontal regions. There is resolution of midline shift. No sign of ischemic infarction. No worsening or new finding. Vascular: There is atherosclerotic calcification of the major vessels at the base of the brain. Skull: Otherwise negative Sinuses/Orbits: Clear/normal Other: None IMPRESSION: Interval placement of bilateral subdural drains. The amount of subdural blood and fluid is decreased on both sides, more so on the right than the left. Resolution of midline shift. Considerable amount of subdural air in the non dependent frontal regions. Electronically Signed    By: Paulina Fusi  M.D.   On: 07/15/2023 10:08   CT CERVICAL SPINE WO CONTRAST  Result Date: 07/14/2023 CLINICAL DATA:  Mental status change, unknown cause EXAM: CT CERVICAL SPINE WITHOUT CONTRAST TECHNIQUE: Multidetector CT imaging of the cervical spine was performed without intravenous contrast. Multiplanar CT image reconstructions were also generated. RADIATION DOSE REDUCTION: This exam was performed according to the departmental dose-optimization program which includes automated exposure control, adjustment of the mA and/or kV according to patient size and/or use of iterative reconstruction technique. COMPARISON:  CT C Spine 05/14/22 FINDINGS: Alignment: Straightening of the normal cervical lordosis. Skull base and vertebrae: No acute fracture. No primary bone lesion or focal pathologic process. 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 Soft tissues and spinal canal: No prevertebral fluid or swelling. No visible canal hematoma. Disc levels:  Moderate to severe spinal canal narrowing at C5-C6. Upper chest: Negative. Other: None. IMPRESSION: 1. No acute fracture or traumatic subluxation of the cervical spine. 2. 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 Moderate to severe spinal canal narrowing at C5-C6. Electronically Signed   By: Lorenza Cambridge M.D.   On: 07/14/2023 13:52   CT Head Wo Contrast  Result Date: 07/14/2023 CLINICAL DATA:  Mental status change, unknown cause EXAM: CT HEAD WITHOUT CONTRAST TECHNIQUE: Contiguous axial images were obtained from the base of the skull through the vertex without intravenous contrast. RADIATION DOSE REDUCTION: This exam was performed according to the departmental dose-optimization program which includes automated exposure control, adjustment of the mA and/or kV according to patient size and/or use of iterative reconstruction technique. COMPARISON:  CT Head 04/05/23 FINDINGS: Brain: There are acute subdural  hematomas bilaterally with 1.2 cm leftward midline shift. The subdural hematoma along the right cerebral convexity measures up to 1.7 cm. The subdural hematoma along the left cerebral convexity measures up to 7 mm. There is effacement of the sulci bilaterally with likely bilateral uncal herniation. No hydrocephalus. Vascular: No hyperdense vessel or unexpected calcification. Skull: Normal. Negative for fracture or focal lesion. Sinuses/Orbits: No acute finding. Other: None. IMPRESSION: Acute bilateral subdural hematomas with 1.2 cm leftward midline shift and likely early bilateral uncal herniation. Findings were discussed with Dr. [name] on 07/14/23 at 1:05 PM. Electronically Signed   By: Lorenza Cambridge M.D.   On: 07/14/2023 13:06   DG Chest Port 1 View  Result Date: 07/14/2023 CLINICAL DATA:  [id number] Sepsis [facility]) [id number] EXAM: PORTABLE CHEST 1 VIEW COMPARISON:  CXR 04/05/23 FINDINGS: No pleural effusion. No pneumothorax. No focal airspace opacity. Normal cardiac and mediastinal contours. No radiographically apparent displaced rib fractures. Visualized upper abdomen is unremarkable. Postprocedural changes from TAVR. There are prominent bilateral interstitial opacities that could represent pulmonary venous congestion. Degenerative changes of the right glenohumeral joint IMPRESSION: No focal airspace opacity. Electronically Signed   By: Lorenza Cambridge M.D.   On: 07/14/2023 10:36    Microbiology: Recent Results (from the past 240 hour(s))  Culture, blood (Routine X 2) w Reflex to ID Panel     Status: None   Collection Time: 07/24/23  3:07 PM   Specimen: BLOOD  Result Value Ref Range Status   Specimen Description BLOOD RIGHT ANTECUBITAL  Final   Special Requests   Final    BOTTLES DRAWN AEROBIC AND ANAEROBIC Blood Culture adequate volume   Culture   Final    NO GROWTH 5 DAYS Performed at [facility], [address] [place], [place] [address]    Report Status 07/29/2023 FINAL  Final   Culture, blood (Routine X 2) w Reflex to ID Panel     Status: None   Collection Time: 07/24/23  3:13 PM   Specimen: BLOOD  Result Value Ref Range Status   Specimen Description BLOOD BLOOD RIGHT HAND  Final   Special Requests   Final    BOTTLES DRAWN AEROBIC AND ANAEROBIC Blood Culture adequate volume   Culture   Final    NO GROWTH 5 DAYS Performed at [facility], [address] [place], [place] [address]    Report Status 07/29/2023 FINAL  Final  Culture, blood (Routine X 2) w Reflex to ID Panel     Status: None (Preliminary result)   Collection Time: 07/29/23 10:33 AM   Specimen: BLOOD RIGHT HAND  Result Value Ref Range Status   Specimen Description BLOOD RIGHT HAND  Final   Special Requests   Final    BOTTLES DRAWN AEROBIC AND ANAEROBIC Blood Culture adequate volume   Culture   Final    NO GROWTH 3 DAYS Performed at [facility], [address] [place], [place] [address]    Report Status PENDING  Incomplete  Culture, blood (Routine X 2) w Reflex to ID Panel     Status: None (Preliminary result)   Collection Time: 07/29/23 10:43 AM   Specimen: BLOOD RIGHT ARM  Result Value Ref Range Status   Specimen Description BLOOD RIGHT ARM  Final   Special Requests   Final    BOTTLES DRAWN AEROBIC AND ANAEROBIC Blood Culture results may not be optimal due to an excessive volume of blood received in culture bottles   Culture   Final    NO GROWTH 3 DAYS Performed at [facility], [address] [place], [place] [address]    Report Status PENDING  Incomplete     Labs: CBC: Recent Labs  Lab 07/28/23 0448 07/29/23 0316 07/30/23 0315 07/31/23 0533 08/01/23 0531  WBC 3.6* 3.8* 3.0* 3.6* 3.1*  NEUTROABS  --   --  2.0 2.4 1.9  HGB 9.5* 9.5* 9.6* 10.2* 9.9*  HCT 27.3* 27.1* 26.7* 28.3* 28.1*  MCV 94.8 94.8 92.1 92.8 94.3  PLT 58* 66* 70* 76* 68*   Basic Metabolic Panel: Recent Labs  Lab 07/27/23 0523 07/28/23 0448 07/29/23 0316 07/30/23 0315  07/31/23 0533 08/01/23 0531  NA 134* 134* 134* 135 133* 135  K 3.4* 3.7 3.9 3.8 3.8 3.7  CL 108 107 107 109 106 106  CO2 21* 21* 22 22 22 23   GLUCOSE 102* 105* 131* 119* 139* 121*  BUN 12 11 12 11 12 12   CREATININE 0.70 0.64 0.64 0.65 0.63 0.63  CALCIUM 7.7* 7.9* 7.9* 8.2* 8.1* 8.4*  MG 1.7  --  1.5* 1.4* 1.7 1.6*  PHOS 2.6 2.7 3.0 2.6 2.7 2.7   Liver Function Tests: Recent Labs  Lab 07/28/23 0448  AST 52*  ALT 42  ALKPHOS 79  BILITOT 1.8*  PROT 5.3*  ALBUMIN 2.3*   No results for input(s): "LIPASE", "AMYLASE" in the last 168 hours. Recent Labs  Lab 07/28/23 0453 07/29/23 0802 07/30/23 0315 07/31/23 0533 08/01/23 0531  AMMONIA 50* 58* 67* 54* 38*   Cardiac Enzymes: No results for input(s): "CKTOTAL", "CKMB", "CKMBINDEX", "TROPONINI" in the last 168 hours. BNP (last 3 results) Recent Labs    08/03/22 0429  BNP 47.7   CBG: Recent Labs  Lab 07/31/23 1136 07/31/23 1736 07/31/23 2202 08/01/23 0750 08/01/23 1150  GLUCAP 215* 112* 123* 111* 184*  Time spent: 35 minutes  Signed:  Gillis Santa  [facility] 08/01/2023 3:56 PM

## 2023-07-31 NOTE — Progress Notes (Signed)
*  PRELIMINARY RESULTS* Echocardiogram 2D Echocardiogram has been performed.  Carolyne Fiscal 07/31/2023, 4:24 PM

## 2023-07-31 NOTE — Progress Notes (Signed)
Subjective: No significant changes  Exam: Vitals:   07/31/23 0400 07/31/23 0742  BP:  125/68  Pulse: 69 70  Resp:    Temp: 98.8 F (37.1 C) 98.7 F (37.1 C)  SpO2: 98% 98%   Gen: In bed, NAD Resp: non-labored breathing, no acute distress Abd: soft, nt  Neuro: MS: He awakens to voice, able to tell me his month, name.  Gives the year is 2006 CN: Endorses seeing fingers wiggle in both hemifield's, crosses midline in both directions Motor: He will lift up his arms and hold them against gravity today, wiggles toes bilaterally.  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  My suspicion is that his worsening is multifactorial secondary to his liver disease, bacteremia, in the setting of underlying CNS pathology (SDH).  His CT showed stability since August 7, however there was accumulation between the scan of August 5 and August 7.  I have asked neurosurgery to comment on if there are any other treatment options for him and the family is wanting to pursue transfer for consideration of middle meningeal artery embolization.  This seems reasonable.  From a neurological standpoint, I do not think I would add any other medications or titrate down further on the Keppra.  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  I do not think I would make any other changes pending transfer unless he were to have significant change.  Recommendations: 1) continue Keppra 250 twice daily 2) lactulose per IM, ensure ammonia continues to trend down 3) appreciate neurosurgery assistance 4) neurology will be available on an as-needed basis.  Ritta Slot, MD [facility] [phone]  If 7pm- 7am, please page neurology on call as listed in AMION.

## 2023-07-31 NOTE — Progress Notes (Signed)
Date of Admission:  07/14/2023      ID: Joshua Berry is a 77 y.o. male  Principal Problem:   Subdural hematoma (HCC) Active Problems:   Cirrhosis of liver without ascites (HCC)   Compression of brain (HCC)   Uncal herniation (HCC)   Acute nontraumatic intracranial subdural hematoma (HCC)   Staphylococcus epidermidis bacteremia    Subjective: Lethargic, but responds to questions slowly  Medications:   vitamin C  500 mg Oral BID   bisacodyl  10 mg Oral BID   Chlorhexidine Gluconate Cloth  6 each Topical Daily   clotrimazole   Topical BID   feeding supplement  237 mL Oral TID BM   Gerhardt's butt cream   Topical TID   insulin aspart  0-6 Units Subcutaneous TID WC   lactulose  30 g Oral TID   Or   lactulose  300 mL Rectal TID   levETIRAcetam  250 mg Oral BID   levothyroxine  100 mcg Oral Q0600   magnesium oxide  400 mg Oral BID   multivitamin with minerals  1 tablet Oral Daily   mouth rinse  15 mL Mouth Rinse 4 times per day   pantoprazole  40 mg Oral BID   rifaximin  550 mg Oral BID   sodium chloride  1 g Oral BID WC   terazosin  5 mg Oral QHS    Objective: Vital signs in last 24 hours: Patient Vitals for the past 24 hrs:  BP Temp Temp src Pulse Resp SpO2 Weight  07/31/23 1900 (!) 132/55 98.2 F (36.8 C) Oral 77 12 99 % --  07/31/23 1809 113/61 98 F (36.7 C) Oral 74 13 98 % --  07/31/23 1100 126/61 98.3 F (36.8 C) Oral 66 -- 99 % --  07/31/23 0742 125/68 98.7 F (37.1 C) Oral 70 -- 98 % --  07/31/23 0706 -- -- -- -- -- -- 107.8 kg  07/31/23 0400 -- 98.8 F (37.1 C) Oral 69 -- 98 % --      PHYSICAL EXAM:  General: awake, but very slow movements, speech slow, keeps rubbing his head Scalp surgical site - no erythema, discharge or swelling Lungs: b/l air entry Heart: s1s2 Abdomen: Soft, non-tender,not distended. Bowel sounds normal. No masses Extremities: atraumatic, no cyanosis. No edema. No clubbing Skin: No rashes or lesions. Or bruising Lymph:  Cervical, supraclavicular normal. Neurologic: moves all limbs  Lab Results    Latest Ref Rng & Units 07/31/2023    5:33 AM 07/30/2023    3:15 AM 07/29/2023    3:16 AM  CBC  WBC 4.0 - 10.5 K/uL 3.6  3.0  3.8   Hemoglobin 13.0 - 17.0 g/dL 16.1  9.6  9.5   Hematocrit 39.0 - 52.0 % 28.3  26.7  27.1   Platelets 150 - 400 K/uL 76  70  66        Latest Ref Rng & Units 07/31/2023    5:33 AM 07/30/2023    3:15 AM 07/29/2023    3:16 AM  CMP  Glucose 70 - 99 mg/dL 096  045  409   BUN 8 - 23 mg/dL 12  11  12    Creatinine 0.61 - 1.24 mg/dL 8.11  9.14  7.82   Sodium 135 - 145 mmol/L 133  135  134   Potassium 3.5 - 5.1 mmol/L 3.8  3.8  3.9   Chloride 98 - 111 mmol/L 106  109  107   CO2 22 -  32 mmol/L 22  22  22    Calcium 8.9 - 10.3 mg/dL 8.1  8.2  7.9       Microbiology: Bc- staph epidermidis X 4 07/24/23 BC NG 07/29/23 BC sent Studies/Results: ECHOCARDIOGRAM COMPLETE  Result Date: 07/31/2023    ECHOCARDIOGRAM REPORT   Patient Name:   Joshua Berry Azam Date of Exam: 07/31/2023 Medical Rec #:  [id number]     Height:       74.0 in Accession #:    [id number]    Weight:       237.7 lb Date of Birth:  04-Feb-1946     BSA:          2.339 m Patient Age:    77 years      BP:           125/68 mmHg Patient Gender: M             HR:           76 bpm. Exam Location:  [facility] Procedure: 2D Echo, Cardiac Doppler, Color Doppler and Intracardiac            Opacification Agent Indications:     Post TAVR evaluation  History:         Patient has prior history of Echocardiogram examinations, most                  recent 04/09/2023. CHF, Signs/Symptoms:Chest Pain, Altered                  Mental Status and Bacteremia; Risk Factors:Hypertension,                  Diabetes and Dyslipidemia. 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  Sonographer:     Mikki Harbor Referring Phys:  [id number] Gillis Santa Diagnosing Phys: Julien Nordmann MD  Sonographer Comments: Technically difficult study due to  poor echo windows and suboptimal apical window. IMPRESSIONS  1. Left ventricular ejection fraction, by estimation, is 60 to 65%. The left ventricle has normal function. The left ventricle has no regional wall motion abnormalities. There is moderate left ventricular hypertrophy. Left ventricular diastolic parameters are consistent with Grade I diastolic dysfunction (impaired relaxation).  2. Right ventricular systolic function is normal. The right ventricular size is normal. There is normal pulmonary artery systolic pressure. The estimated right ventricular systolic pressure is 34.6 mmHg.  3. Left atrial size was mildly dilated.  4. The mitral valve is normal in structure. No evidence of mitral valve regurgitation. No evidence of mitral stenosis. Moderate mitral annular calcification.  5. The aortic valve has been repaired/replaced, bioprosthetic valve, s/p TAVR. Aortic valve regurgitation is not visualized. Aortic valve mean gradient measures 12.7 mmHg.  6. The inferior vena cava is normal in size with greater than 50% respiratory variability, suggesting right atrial pressure of 3 mmHg. FINDINGS  Left Ventricle: Left ventricular ejection fraction, by estimation, is 60 to 65%. The left ventricle has normal function. The left ventricle has no regional wall motion abnormalities. Definity contrast agent was given IV to delineate the left ventricular  endocardial borders. The left ventricular internal cavity size was normal in size. There is moderate left ventricular hypertrophy. Left ventricular diastolic parameters are consistent with Grade I diastolic dysfunction (impaired relaxation). Right Ventricle: The right ventricular size is normal. No increase in right ventricular wall thickness. Right ventricular systolic  function is normal. There is normal pulmonary artery systolic pressure. The tricuspid regurgitant velocity is 2.58 m/s, and  with an assumed right atrial pressure of 8 mmHg, the estimated right ventricular  systolic pressure is 34.6 mmHg. Left Atrium: Left atrial size was mildly dilated. Right Atrium: Right atrial size was normal in size. Pericardium: There is no evidence of pericardial effusion. Mitral Valve: The mitral valve is normal in structure. Moderate mitral annular calcification. No evidence of mitral valve regurgitation. No evidence of mitral valve stenosis. MV peak gradient, 9.1 mmHg. The mean mitral valve gradient is 4.0 mmHg. Tricuspid Valve: The tricuspid valve is normal in structure. Tricuspid valve regurgitation is mild . No evidence of tricuspid stenosis. Aortic Valve: The aortic valve has been repaired/replaced. Aortic valve regurgitation is not visualized. Aortic valve sclerosis is present, with no evidence of aortic valve stenosis. Aortic valve mean gradient measures 12.7 mmHg. Aortic valve peak gradient measures 25.1 mmHg. Aortic valve area, by VTI measures 1.40 cm. 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 Pulmonic Valve: The pulmonic valve was normal in structure. Pulmonic valve regurgitation is not visualized. No evidence of pulmonic stenosis. Aorta: The aortic root is normal in size and structure. Venous: The inferior vena cava is normal in size with greater than 50% respiratory variability, suggesting right atrial pressure of 3 mmHg. IAS/Shunts: No atrial level shunt detected by color flow Doppler.  LEFT VENTRICLE PLAX 2D LVIDd:         5.40 cm   Diastology LVIDs:         3.50 cm   LV e' medial:    7.51 cm/s LV PW:         1.30 cm   LV E/e' medial:  17.8 LV IVS:        1.50 cm   LV e' lateral:   10.90 cm/s LVOT diam:     2.00 cm   LV E/e' lateral: 12.3 LV SV:         72 LV SV Index:   31 LVOT Area:     3.14 cm  RIGHT VENTRICLE RV Basal diam:  4.90 cm RV S prime:     16.60 cm/s LEFT ATRIUM           Index        RIGHT ATRIUM           Index LA diam:      4.70 cm 2.01 cm/m   RA Area:     24.50 cm LA Vol (A4C): 63.0 ml 26.94 ml/m  RA Volume:   69.60 ml  29.76 ml/m   AORTIC VALVE                     PULMONIC VALVE AV Area (Vmax):    1.29 cm      PV Vmax:       1.47 m/s AV Area (Vmean):   1.22 cm      PV Peak grad:  8.6 mmHg AV Area (VTI):     1.40 cm AV Vmax:           250.33 cm/s AV Vmean:          160.000 cm/s AV VTI:            0.513 m AV Peak Grad:      25.1 mmHg AV Mean Grad:      12.7 mmHg LVOT Vmax:         102.50 cm/s LVOT Vmean:  62.250 cm/s LVOT VTI:          0.228 m LVOT/AV VTI ratio: 0.44  AORTA Ao Root diam: 3.60 cm MITRAL VALVE                TRICUSPID VALVE MV Area (PHT): 1.72 cm     TR Peak grad:   26.6 mmHg MV Area VTI:   1.43 cm     TR Vmax:        258.00 cm/s MV Peak grad:  9.1 mmHg MV Mean grad:  4.0 mmHg     SHUNTS MV Vmax:       1.51 m/s     Systemic VTI:  0.23 m MV Vmean:      88.8 cm/s    Systemic Diam: 2.00 cm MV Decel Time: 440 msec MV E velocity: 134.00 cm/s MV A velocity: 153.00 cm/s MV E/A ratio:  0.88 Julien Nordmann MD Electronically signed by Julien Nordmann MD Signature Date/Time: 07/31/2023/4:48:21 PM    Final      Assessment/Plan:  Staph epidermidis bacteremia --Fever and altered mental status  in a patient who had burr hole surgery for bi frontal subdural hematoma- - The Subdural drain had entered csf  - concern for meningitis VS infected hematomas /abscess CT/MRI has not shown abscess  Fever has resolved -On  vanco ( started 07/23/23)  day 9 - may need upto  4  weeks , depending on how he progresses and whether he would need further intervention for CNS infected hematoma  Watch closely the trough and creatinine  B/l subdural hematomas- s/p evacuation thru burr hole Planning to transfer to [facility] for embolization   Fluctuating encephalopathy- combination of subdural hematoma, surgery, Decompensated cirrhosis, hepatic encephalopathy and bacteremia    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

## 2023-07-31 NOTE — Progress Notes (Signed)
                                                     Palliative Care Progress Note   Patient Name: Joshua Berry       Date: 07/31/2023 DOB: 14-Aug-1946  Age: 77 y.o. MRN#: [id number] Attending Physician: Gillis Santa, MD Primary Care Physician: [facility] Date: 07/14/2023  Chart reviewed. No acute palliative needs today. Plan remains for watchful waiting. Time for outcomes needed.  Goals are unchanged. DNR with limited interventions and treat the treatable remains.   PMT will continue to follow.  Samara Deist L. Bonita Quin, DNP, FNP-BC Palliative Medicine Team  No charge

## 2023-07-31 NOTE — Plan of Care (Signed)
  Problem: Health Behavior/Discharge Planning: Goal: Ability to manage health-related needs will improve Outcome: Progressing   Problem: Clinical Measurements: Goal: Ability to maintain clinical measurements within normal limits will improve Outcome: Progressing Goal: Will remain free from infection Outcome: Progressing Goal: Diagnostic test results will improve Outcome: Progressing Goal: Respiratory complications will improve Outcome: Progressing Goal: Cardiovascular complication will be avoided Outcome: Progressing   Problem: Activity: Goal: Risk for activity intolerance will decrease Outcome: Progressing   Problem: Nutrition: Goal: Adequate nutrition will be maintained Outcome: Progressing   Problem: Coping: Goal: Level of anxiety will decrease Outcome: Progressing   Problem: Elimination: Goal: Will not experience complications related to bowel motility Outcome: Progressing Goal: Will not experience complications related to urinary retention Outcome: Progressing   Problem: Pain Managment: Goal: General experience of comfort will improve Outcome: Progressing   Problem: Safety: Goal: Ability to remain free from injury will improve Outcome: Progressing   Problem: Skin Integrity: Goal: Risk for impaired skin integrity will decrease Outcome: Progressing   Problem: Education: Goal: Ability to describe self-care measures that may prevent or decrease complications (Diabetes Survival Skills Education) will improve Outcome: Progressing Goal: Individualized Educational Video(s) Outcome: Progressing   Problem: Coping: Goal: Ability to adjust to condition or change in health will improve Outcome: Progressing   Problem: Fluid Volume: Goal: Ability to maintain a balanced intake and output will improve Outcome: Progressing   Problem: Health Behavior/Discharge Planning: Goal: Ability to identify and utilize available resources and services will improve Outcome:  Progressing Goal: Ability to manage health-related needs will improve Outcome: Progressing   Problem: Metabolic: Goal: Ability to maintain appropriate glucose levels will improve Outcome: Progressing   Problem: Nutritional: Goal: Maintenance of adequate nutrition will improve Outcome: Progressing Goal: Progress toward achieving an optimal weight will improve Outcome: Progressing   Problem: Skin Integrity: Goal: Risk for impaired skin integrity will decrease Outcome: Progressing   Problem: Tissue Perfusion: Goal: Adequacy of tissue perfusion will improve Outcome: Progressing   Problem: Education: Goal: Ability to describe self-care measures that may prevent or decrease complications (Diabetes Survival Skills Education) will improve Outcome: Progressing   Problem: Coping: Goal: Ability to adjust to condition or change in health will improve Outcome: Progressing   Problem: Fluid Volume: Goal: Ability to maintain a balanced intake and output will improve Outcome: Progressing   Problem: Health Behavior/Discharge Planning: Goal: Ability to identify and utilize available resources and services will improve Outcome: Progressing Goal: Ability to manage health-related needs will improve Outcome: Progressing   Problem: Metabolic: Goal: Ability to maintain appropriate glucose levels will improve Outcome: Progressing   Problem: Nutritional: Goal: Maintenance of adequate nutrition will improve Outcome: Progressing Goal: Progress toward achieving an optimal weight will improve Outcome: Progressing   Problem: Skin Integrity: Goal: Risk for impaired skin integrity will decrease Outcome: Progressing   Problem: Tissue Perfusion: Goal: Adequacy of tissue perfusion will improve Outcome: Progressing

## 2023-07-31 NOTE — Telephone Encounter (Signed)
Given new plan to transfer to [facility], ok to cancel the plan for repeat head CT and post op appointment with you on 08/26/23?

## 2023-07-31 NOTE — Progress Notes (Signed)
[facility] Progress Note  [name]    ZOX:[id number]  DOA: 07/14/2023     Date of Service: the patient was seen and examined on 07/31/2023  Chief Complaint  Patient presents with   Code Sepsis   Brief hospital course: LADARIS BESSE is a 77 yo M with hx of NASH with liver cirrhosis, portal HTN, TIPS, GIB/GAVE, pancytopenia, OSA, TAVR, left shoulder replacement, multiple episodes of bacteremia requiring prolonged IV abx treatment who presented to [facility] ED from [place] assisted living via EMS for evaluation of altered mental status.    ED workup revealed bilateral subdural hematomas with midline shift and signs of herniation. Family in agreement with surgical intervention. NSGY administered platelets and FFP to improve coagulopathy then brought patient to OR for hematoma evacuation.    Patient was in ICU managed by PCCM, then transferred to hospitalist service on 7/31. 07/21/23 he spiked fever, started on prophylactic meningitis treatment.  07/23/23 MRSE positive blood cultures. ID on board. Continue Vanco, stopped Cefepime. MRI brain rule out abscess. Infected hematoma ordered. 07/24/23 MRI of the brain bilateral holohemispheric extra-axial collections measuring 9 mm, mass effect on both superior hemispheres.  Neurosurgery team suggested MRI findings not concerning.  Advised cEEG, neurology asked for follow-up. 8/9: EEG negative for any acute seizure 8/10: Transfer to PCU.  PT and OT eval 8/11: Patient more drowsy, cut back dose of Keppra from 1000 to 500 mg twice daily, ammonia elevated so ordered p.o. lactulose (as rectal may not be working/staying inside to have effect)  Assessment and Plan: # Acute Bilateral Subdural Hematomas  s/p bilateral frontal burr hole washout done on 7/29 Drains removed on 8/1. S/p plt and FFP.  No need for further supplementation. Staples removed on 8/8 by neurosurgery..  Sutures in place and the previous drain sites are dissolvable per  neurosurgery Sodium 123 -> 134, continue salt tabs  MRI of the brain bilateral holohemispheric extra-axial collections measuring 9 mm, mass effect on both superior hemispheres. No intervention per Neuro surgery team. Discussed with Neurologist, EEG not showing any active seizure.  8/12 CT Head: Similar size of the bilateral subdural collections compared to the recent brain MRI, accounting for differences in modality. Unchanged 6 mm of leftward midline shift. 8/14 follow neurosurgery for further recommendation 8/15 neurosurgery recommended to transfer to [facility] under interventional neurosurgery for embolization due to reaccumulation of fluid. Awaiting for transfer to [facility] under hospitalist service, neurosurgeon Dr. Janeece Agee is aware and will be consulting on him.  # Acute metabolic encephalopathy In the setting of subdural hematoma, hepatic encephalopathy, infection.  He has waxing and waning mental status likely due to hyperammonemia and bilateral hemispheric subdural collections On 8/5 Neuro was consulted for persistent somnolence.  EEG neg for seizure, Keppra decreased 1000 ->>500 ->> 250 mg IV bid AMS, fever 8/5- Started treatment for hepatic encephalopathy and meningitis. Rectal tube inserted due to skin breakdown. Continue lactulose enema QID.  Repeat ammonia 63-> 114.  Continue oral lactulose, continue rifaximin.  Concern is that patient may not be holding lactulose in in the rectum for 30 to 45 minutes to have actual effect of medicine Continue vancomycin for MRSE blood cultures.   Neurology following.  EEG negative for seizure. His repeat MRI 07/23/23 with mass effect on both sides may be the cause of his lethargy. Continue neurochecks.  Indwelling foley for urinary retention. 8/11: Patient much more drowsy today so ammonia checked which is elevated.  Lactulose ordered by mouth.  Cutting back Keppra dose  to 500 mg twice daily.   Appreciate neurology reevaluation.   If his mentation does not  improve neurology may consider Provigil or amitriptyline  If he is not able to tolerate oral lactulose may need to consider NG tube/Dobbhoff tube after talking with his GI doctor at [facility] (family insist we call his GI before putting tube considering his esophageal varices for which they are concerned about rupture if inserting tube)   # Fever, AMS # MRSE bacteremia Since pt had a subdural drain that had drained spinal fluid, there is concern for noscomial meningitis. Neurosurgery, who deemed LP to carry too much risk. Decision made to start tx for meningitis empirically.  Continue IV vancomycin Given MRSE bacteremia, ID recommended to stop cefepime, continue vancomycin for now 8/13 repeat blood cultures was ordered due to fever, Bld Cx NGTD  8/14  TTE LVEF 60 to 65%, moderate LV hypertrophy, grade 1 diastolic dysfunction. S/p TAVR, no any other significant findings   # Acute urinary retention Foley reinsertion and care during the inpatient stay. Once his mental status improves will remove foley with voiding trial.   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 Chronic Leukopenia s/p plt and FFP. Continue lactulose, rifaximin therapy.  Continue to monitor neurochecks. Nursing supportive care. 8/13 increase lactulose 30 g p.o. 3 times daily, Dulcolax 10 mg x 1 dose given as well Ammonia level 58, gradually improving 8/14 started Dulcolax 10 mg p.o. twice daily for 3 days  # Frequent PVCs # Hypomagnesemia, mag repleted. Monitor electrolytes and replete Continue to monitor on telemetry   # OSA: Not compliant with CPAP. # Hypothyroidism: Continue home dose Synthroid # Type 2 Diabetes Mellitus: Sliding scale insulin as per floor protocol # GERD: Continue PPI therapy. # Dyslipidemia: Resume statin at discharge.   Body mass index is 30.51 kg/m.  Nutrition Problem: Inadequate oral  intake Etiology: acute illness Interventions:    Pressure Injury 06/17/23 Coccyx Mid Stage 1 -  Intact skin with non-blanchable redness of a localized area usually over a bony prominence. (Active)  06/17/23 0615  Location: Coccyx  Location Orientation: Mid  Staging: Stage 1 -  Intact skin with non-blanchable redness of a localized area usually over a bony prominence.  Wound Description (Comments):   Present on Admission:      Pressure Injury 07/18/23 Buttocks Left;Right Stage 1 -  Intact skin with non-blanchable redness of a localized area usually over a bony prominence. (Active)  07/18/23 1450  Location: Buttocks  Location Orientation: Left;Right  Staging: Stage 1 -  Intact skin with non-blanchable redness of a localized area usually over a bony prominence.  Wound Description (Comments):   Present on Admission: Yes  Dressing Type Foam - Lift dressing to assess site every shift 07/31/23 0918     Diet: Dysphagia 1 diet DVT Prophylaxis: SCD, pharmacological prophylaxis contraindicated due to thrombocytopenia    Advance goals of care discussion: DNR  Family Communication: family was present at bedside, at the time of interview.  The pt provided permission to discuss medical plan with the family. Opportunity was given to ask question and all questions were answered satisfactorily.   Disposition:  Pt is from ALF, admitted with subdural hematoma, NASH liver cirrhosis, hyperammonemia,, still has AMS, which precludes a safe discharge. Discharge to SNF, when clinically stable, may need few more days to become stable.  Subjective: No significant events overnight, patient is still sleepy, hard to wake up.  Unable to offer any complaints, seems to be resting overnight. Patient's wife is at bedside, agreed with the plan to transfer to [facility].  Physical Exam: General: NAD, lying comfortably, sleepy Appear in no distress Eyes: PERRLA ENT: Oral Mucosa Clear, moist  Neck: no JVD,   Cardiovascular: S1 and S2 Present, no Murmur,  Respiratory: good respiratory effort, Bilateral Air entry equal and Decreased, no Crackles, no wheezes Abdomen: Bowel Sound present, Soft and no tenderness,  Skin: no rashes Extremities: no Pedal edema, no calf tenderness Neurologic: Sleepy, unable to follow commands  Gait not checked due to patient safety concerns  Vitals:   07/31/23 0400 07/31/23 0706 07/31/23 0742 07/31/23 1100  BP:   125/68 126/61  Pulse: 69  70 66  Resp:      Temp: 98.8 F (37.1 C)  98.7 F (37.1 C) 98.3 F (36.8 C)  TempSrc: Oral  Oral Oral  SpO2: 98%  98% 99%  Weight:  107.8 kg    Height:        Intake/Output Summary (Last 24 hours) at 07/31/2023 1716 Last data filed at 07/31/2023 1127 Gross per 24 hour  Intake 2106.9 ml  Output 1595 ml  Net 511.9 ml   Filed Weights   07/29/23 0704 07/30/23 0500 07/31/23 0706  Weight: 108.2 kg 107.7 kg 107.8 kg    Data Reviewed: I have personally reviewed and interpreted daily labs, tele strips, imagings as discussed above. I reviewed all nursing notes, pharmacy notes, vitals, pertinent old records I have discussed plan of care as described above with RN and patient/family.  CBC: Recent Labs  Lab 07/27/23 0523 07/28/23 0448 07/29/23 0316 07/30/23 0315 07/31/23 0533  WBC 3.4* 3.6* 3.8* 3.0* 3.6*  NEUTROABS  --   --   --  2.0 2.4  HGB 9.0* 9.5* 9.5* 9.6* 10.2*  HCT 25.6* 27.3* 27.1* 26.7* 28.3*  MCV 92.4 94.8 94.8 92.1 92.8  PLT 54* 58* 66* 70* 76*   Basic Metabolic Panel: Recent Labs  Lab 07/26/23 0636 07/27/23 0523 07/28/23 0448 07/29/23 0316 07/30/23 0315 07/31/23 0533  NA 136 134* 134* 134* 135 133*  K 3.5 3.4* 3.7 3.9 3.8 3.8  CL 109 108 107 107 109 106  CO2 21* 21* 21* 22 22 22   GLUCOSE 106* 102* 105* 131* 119* 139*  BUN 10 12 11 12 11 12   CREATININE 0.67 0.70 0.64 0.64 0.65 0.63  CALCIUM 8.0* 7.7* 7.9* 7.9* 8.2* 8.1*  MG 1.6* 1.7  --  1.5* 1.4* 1.7  PHOS 2.6 2.6 2.7 3.0 2.6 2.7     Studies: ECHOCARDIOGRAM COMPLETE  Result Date: 07/31/2023    ECHOCARDIOGRAM REPORT   Patient Name:   ALRIC CAPLEY Weppler Date of Exam: 07/31/2023 Medical Rec #:  [id number]     Height:       74.0 in Accession #:    [id number]    Weight:       237.7 lb Date of Birth:  03/22/1946     BSA:          2.339 m Patient Age:    77 years      BP:           125/68 mmHg Patient Gender: M             HR:           76 bpm. Exam Location:  [facility] Procedure: 2D Echo, Cardiac Doppler, Color Doppler and Intracardiac  Opacification Agent Indications:     Post TAVR evaluation  History:         Patient has prior history of Echocardiogram examinations, most                  recent 04/09/2023. CHF, Signs/Symptoms:Chest Pain, Altered                  Mental Status and Bacteremia; Risk Factors:Hypertension,                  Diabetes and Dyslipidemia. 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  Sonographer:     Mikki Harbor Referring Phys:  [id number] Gillis Santa Diagnosing Phys: Julien Nordmann MD  Sonographer Comments: Technically difficult study due to poor echo windows and suboptimal apical window. IMPRESSIONS  1. Left ventricular ejection fraction, by estimation, is 60 to 65%. The left ventricle has normal function. The left ventricle has no regional wall motion abnormalities. There is moderate left ventricular hypertrophy. Left ventricular diastolic parameters are consistent with Grade I diastolic dysfunction (impaired relaxation).  2. Right ventricular systolic function is normal. The right ventricular size is normal. There is normal pulmonary artery systolic pressure. The estimated right ventricular systolic pressure is 34.6 mmHg.  3. Left atrial size was mildly dilated.  4. The mitral valve is normal in structure. No evidence of mitral valve regurgitation. No evidence of mitral stenosis. Moderate mitral annular calcification.  5. The aortic valve has been repaired/replaced, bioprosthetic  valve, s/p TAVR. Aortic valve regurgitation is not visualized. Aortic valve mean gradient measures 12.7 mmHg.  6. The inferior vena cava is normal in size with greater than 50% respiratory variability, suggesting right atrial pressure of 3 mmHg. FINDINGS  Left Ventricle: Left ventricular ejection fraction, by estimation, is 60 to 65%. The left ventricle has normal function. The left ventricle has no regional wall motion abnormalities. Definity contrast agent was given IV to delineate the left ventricular  endocardial borders. The left ventricular internal cavity size was normal in size. There is moderate left ventricular hypertrophy. Left ventricular diastolic parameters are consistent with Grade I diastolic dysfunction (impaired relaxation). Right Ventricle: The right ventricular size is normal. No increase in right ventricular wall thickness. Right ventricular systolic function is normal. There is normal pulmonary artery systolic pressure. The tricuspid regurgitant velocity is 2.58 m/s, and  with an assumed right atrial pressure of 8 mmHg, the estimated right ventricular systolic pressure is 34.6 mmHg. Left Atrium: Left atrial size was mildly dilated. Right Atrium: Right atrial size was normal in size. Pericardium: There is no evidence of pericardial effusion. Mitral Valve: The mitral valve is normal in structure. Moderate mitral annular calcification. No evidence of mitral valve regurgitation. No evidence of mitral valve stenosis. MV peak gradient, 9.1 mmHg. The mean mitral valve gradient is 4.0 mmHg. Tricuspid Valve: The tricuspid valve is normal in structure. Tricuspid valve regurgitation is mild . No evidence of tricuspid stenosis. Aortic Valve: The aortic valve has been repaired/replaced. Aortic valve regurgitation is not visualized. Aortic valve sclerosis is present, with no evidence of aortic valve stenosis. Aortic valve mean gradient measures 12.7 mmHg. Aortic valve peak gradient measures 25.1 mmHg. Aortic  valve area, by VTI measures 1.40 cm. 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 Pulmonic Valve: The pulmonic valve was normal in structure. Pulmonic valve regurgitation is not visualized. No evidence  of pulmonic stenosis. Aorta: The aortic root is normal in size and structure. Venous: The inferior vena cava is normal in size with greater than 50% respiratory variability, suggesting right atrial pressure of 3 mmHg. IAS/Shunts: No atrial level shunt detected by color flow Doppler.  LEFT VENTRICLE PLAX 2D LVIDd:         5.40 cm   Diastology LVIDs:         3.50 cm   LV e' medial:    7.51 cm/s LV PW:         1.30 cm   LV E/e' medial:  17.8 LV IVS:        1.50 cm   LV e' lateral:   10.90 cm/s LVOT diam:     2.00 cm   LV E/e' lateral: 12.3 LV SV:         72 LV SV Index:   31 LVOT Area:     3.14 cm  RIGHT VENTRICLE RV Basal diam:  4.90 cm RV S prime:     16.60 cm/s LEFT ATRIUM           Index        RIGHT ATRIUM           Index LA diam:      4.70 cm 2.01 cm/m   RA Area:     24.50 cm LA Vol (A4C): 63.0 ml 26.94 ml/m  RA Volume:   69.60 ml  29.76 ml/m  AORTIC VALVE                     PULMONIC VALVE AV Area (Vmax):    1.29 cm      PV Vmax:       1.47 m/s AV Area (Vmean):   1.22 cm      PV Peak grad:  8.6 mmHg AV Area (VTI):     1.40 cm AV Vmax:           250.33 cm/s AV Vmean:          160.000 cm/s AV VTI:            0.513 m AV Peak Grad:      25.1 mmHg AV Mean Grad:      12.7 mmHg LVOT Vmax:         102.50 cm/s LVOT Vmean:        62.250 cm/s LVOT VTI:          0.228 m LVOT/AV VTI ratio: 0.44  AORTA Ao Root diam: 3.60 cm MITRAL VALVE                TRICUSPID VALVE MV Area (PHT): 1.72 cm     TR Peak grad:   26.6 mmHg MV Area VTI:   1.43 cm     TR Vmax:        258.00 cm/s MV Peak grad:  9.1 mmHg MV Mean grad:  4.0 mmHg     SHUNTS MV Vmax:       1.51 m/s     Systemic VTI:  0.23 m MV Vmean:      88.8 cm/s    Systemic Diam: 2.00 cm MV Decel Time: 440 msec MV E velocity: 134.00 cm/s MV A velocity:  153.00 cm/s MV E/A ratio:  0.88 Julien Nordmann MD Electronically signed by Julien Nordmann MD Signature Date/Time: 07/31/2023/4:48:21 PM    Final     Scheduled Meds:  vitamin C  500 mg Oral BID   bisacodyl  10 mg Oral BID   Chlorhexidine Gluconate Cloth  6 each Topical Daily   clotrimazole   Topical BID   feeding supplement  237 mL Oral TID BM   Gerhardt's butt cream   Topical TID   insulin aspart  0-6 Units Subcutaneous TID WC   lactulose  30 g Oral TID   Or   lactulose  300 mL Rectal TID   levETIRAcetam  250 mg Oral BID   levothyroxine  100 mcg Oral Q0600   magnesium oxide  400 mg Oral BID   multivitamin with minerals  1 tablet Oral Daily   mouth rinse  15 mL Mouth Rinse 4 times per day   pantoprazole  40 mg Oral BID   rifaximin  550 mg Oral BID   sodium chloride  1 g Oral BID WC   terazosin  5 mg Oral QHS   Continuous Infusions:  sodium chloride Stopped (07/28/23 1506)   vancomycin Stopped (07/31/23 0729)   PRN Meds: acetaminophen, butalbital-acetaminophen-caffeine, hydrALAZINE, labetalol, mouth rinse, mouth rinse, polyethylene glycol, polyvinyl alcohol  Time spent: 35 minutes  Author: Gillis Santa. MD [facility] 07/31/2023 5:16 PM  To reach On-call, see care teams to locate the attending and reach out to them via [email or web address]. If 7PM-7AM, please contact night-coverage If you still have difficulty reaching the attending provider, please page the [facility] (Director on Call) for [facility] on amion for assistance.

## 2023-07-31 NOTE — Progress Notes (Signed)
PT Cancellation Note  Patient Details Name: Joshua Berry MRN: [id number] DOB: Apr 11, 1946   Cancelled Treatment:     PT attempt. Pt in the middle of procedure/imaging study. Per pt's spouse," He has been mostly non responsive today. Can you try back tomorrow?"    [name] 07/31/2023, 3:19 PM

## 2023-08-01 DIAGNOSIS — S065XAA Traumatic subdural hemorrhage with loss of consciousness status unknown, initial encounter: Secondary | ICD-10-CM | POA: Diagnosis not present

## 2023-08-01 LAB — CBC WITH DIFFERENTIAL/PLATELET
Abs Immature Granulocytes: 0.02 10*3/uL (ref 0.00–0.07)
Basophils Absolute: 0 10*3/uL (ref 0.0–0.1)
Basophils Relative: 1 %
Eosinophils Absolute: 0.1 10*3/uL (ref 0.0–0.5)
Eosinophils Relative: 5 %
HCT: 28.1 % — ABNORMAL LOW (ref 39.0–52.0)
Hemoglobin: 9.9 g/dL — ABNORMAL LOW (ref 13.0–17.0)
Immature Granulocytes: 1 %
Lymphocytes Relative: 20 %
Lymphs Abs: 0.6 10*3/uL — ABNORMAL LOW (ref 0.7–4.0)
MCH: 33.2 pg (ref 26.0–34.0)
MCHC: 35.2 g/dL (ref 30.0–36.0)
MCV: 94.3 fL (ref 80.0–100.0)
Monocytes Absolute: 0.4 10*3/uL (ref 0.1–1.0)
Monocytes Relative: 13 %
Neutro Abs: 1.9 10*3/uL (ref 1.7–7.7)
Neutrophils Relative %: 60 %
Platelets: 68 10*3/uL — ABNORMAL LOW (ref 150–400)
RBC: 2.98 MIL/uL — ABNORMAL LOW (ref 4.22–5.81)
RDW: 15.9 % — ABNORMAL HIGH (ref 11.5–15.5)
WBC: 3.1 10*3/uL — ABNORMAL LOW (ref 4.0–10.5)
nRBC: 0 % (ref 0.0–0.2)

## 2023-08-01 LAB — BASIC METABOLIC PANEL
Anion gap: 6 (ref 5–15)
BUN: 12 mg/dL (ref 8–23)
CO2: 23 mmol/L (ref 22–32)
Calcium: 8.4 mg/dL — ABNORMAL LOW (ref 8.9–10.3)
Chloride: 106 mmol/L (ref 98–111)
Creatinine, Ser: 0.63 mg/dL (ref 0.61–1.24)
GFR, Estimated: >60 mL/min (ref >60)
Glucose, Bld: 121 mg/dL — ABNORMAL HIGH (ref 70–99)
Potassium: 3.7 mmol/L (ref 3.5–5.1)
Sodium: 135 mmol/L (ref 135–145)

## 2023-08-01 LAB — GLUCOSE, CAPILLARY
Glucose-Capillary: 111 mg/dL — ABNORMAL HIGH (ref 70–99)
Glucose-Capillary: 184 mg/dL — ABNORMAL HIGH (ref 70–99)
Glucose-Capillary: 130 mg/dL — ABNORMAL HIGH (ref 70–99)

## 2023-08-01 LAB — MAGNESIUM: Magnesium: 1.6 mg/dL — ABNORMAL LOW (ref 1.7–2.4)

## 2023-08-01 LAB — PHOSPHORUS: Phosphorus: 2.7 mg/dL (ref 2.5–4.6)

## 2023-08-01 LAB — VANCOMYCIN, TROUGH: Vancomycin Tr: 16 ug/mL (ref 15–20)

## 2023-08-01 LAB — AMMONIA: Ammonia: 38 umol/L — ABNORMAL HIGH (ref 9–35)

## 2023-08-01 MED ORDER — MAGNESIUM SULFATE 2 GM/50ML IV SOLN
2.0000 g | Freq: Once | INTRAVENOUS | Status: AC
  Administered 2023-08-01: 2 g via INTRAVENOUS
  Filled 2023-08-01: qty 50

## 2023-08-01 NOTE — Progress Notes (Signed)
Date of Admission:  07/14/2023      ID: Joshua Berry is a 77 y.o. male  Principal Problem:   Subdural hematoma (HCC) Active Problems:   Cirrhosis of liver without ascites (HCC)   Compression of brain (HCC)   Uncal herniation (HCC)   Acute nontraumatic intracranial subdural hematoma (HCC)   Staphylococcus epidermidis bacteremia    Subjective: Status quo Very sleepy but arousable Slow response  Medications:   vitamin C  500 mg Oral BID   bisacodyl  10 mg Oral BID   Chlorhexidine Gluconate Cloth  6 each Topical Daily   clotrimazole   Topical BID   feeding supplement  237 mL Oral TID BM   Gerhardt's butt cream   Topical TID   insulin aspart  0-6 Units Subcutaneous TID WC   lactulose  30 g Oral TID   Or   lactulose  300 mL Rectal TID   levETIRAcetam  250 mg Oral BID   levothyroxine  100 mcg Oral Q0600   magnesium oxide  400 mg Oral BID   multivitamin with minerals  1 tablet Oral Daily   mouth rinse  15 mL Mouth Rinse 4 times per day   pantoprazole  40 mg Oral BID   rifaximin  550 mg Oral BID   sodium chloride  1 g Oral BID WC   terazosin  5 mg Oral QHS    Objective: Vital signs in last 24 hours: Patient Vitals for the past 24 hrs:  BP Temp Temp src Pulse Resp SpO2  08/01/23 1148 -- 98.6 F (37 C) Oral 75 -- 100 %  08/01/23 0800 -- 98.6 F (37 C) Oral -- -- 98 %  08/01/23 0747 133/60 98.8 F (37.1 C) Oral 80 -- 99 %  08/01/23 0600 130/78 98 F (36.7 C) Oral 90 15 98 %  08/01/23 0200 (!) 124/58 -- -- -- 18 --  07/31/23 2300 -- 98.5 F (36.9 C) Oral -- -- --  07/31/23 1900 (!) 132/55 98.2 F (36.8 C) Oral 77 12 99 %  07/31/23 1809 113/61 98 F (36.7 C) Oral 74 13 98 %      PHYSICAL EXAM:  General: somnolent, opens eyes on calling his name and says he is fine Slow verbal response surgical site scalp  - no erythema, discharge or swelling Lungs: b/l air entry Heart: s1s2 Abdomen: Soft, non-tender,not distended. Bowel sounds normal. No  masses Extremities: atraumatic, no cyanosis. No edema. No clubbing Skin: No rashes or lesions. Or bruising Lymph: Cervical, supraclavicular normal. Neurologic: moves all limbs Foley   Lab Results    Latest Ref Rng & Units 08/01/2023    5:31 AM 07/31/2023    5:33 AM 07/30/2023    3:15 AM  CBC  WBC 4.0 - 10.5 K/uL 3.1  3.6  3.0   Hemoglobin 13.0 - 17.0 g/dL 9.9  16.1  9.6   Hematocrit 39.0 - 52.0 % 28.1  28.3  26.7   Platelets 150 - 400 K/uL 68  76  70        Latest Ref Rng & Units 08/01/2023    5:31 AM 07/31/2023    5:33 AM 07/30/2023    3:15 AM  CMP  Glucose 70 - 99 mg/dL 096  045  409   BUN 8 - 23 mg/dL 12  12  11    Creatinine 0.61 - 1.24 mg/dL 8.11  9.14  7.82   Sodium 135 - 145 mmol/L 135  133  135  Potassium 3.5 - 5.1 mmol/L 3.7  3.8  3.8   Chloride 98 - 111 mmol/L 106  106  109   CO2 22 - 32 mmol/L 23  22  22    Calcium 8.9 - 10.3 mg/dL 8.4  8.1  8.2       Microbiology: Bc- staph epidermidis X 4 07/24/23 BC NG 07/29/23 BC NG Studies/Results: ECHOCARDIOGRAM COMPLETE  Result Date: 07/31/2023    ECHOCARDIOGRAM REPORT   Patient Name:   Joshua Berry Date of Exam: 07/31/2023 Medical Rec #:  [id number]     Height:       74.0 in Accession #:    [id number]    Weight:       237.7 lb Date of Birth:  09/10/1946     BSA:          2.339 m Patient Age:    27 years      BP:           125/68 mmHg Patient Gender: M             HR:           76 bpm. Exam Location:  [facility] Procedure: 2D Echo, Cardiac Doppler, Color Doppler and Intracardiac            Opacification Agent Indications:     Post TAVR evaluation  History:         Patient has prior history of Echocardiogram examinations, most                  recent 04/09/2023. CHF, Signs/Symptoms:Chest Pain, Altered                  Mental Status and Bacteremia; Risk Factors:Hypertension,                  Diabetes and Dyslipidemia. 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  Sonographer:     Mikki Harbor Referring  Phys:  [id number] Gillis Santa Diagnosing Phys: Julien Nordmann MD  Sonographer Comments: Technically difficult study due to poor echo windows and suboptimal apical window. IMPRESSIONS  1. Left ventricular ejection fraction, by estimation, is 60 to 65%. The left ventricle has normal function. The left ventricle has no regional wall motion abnormalities. There is moderate left ventricular hypertrophy. Left ventricular diastolic parameters are consistent with Grade I diastolic dysfunction (impaired relaxation).  2. Right ventricular systolic function is normal. The right ventricular size is normal. There is normal pulmonary artery systolic pressure. The estimated right ventricular systolic pressure is 34.6 mmHg.  3. Left atrial size was mildly dilated.  4. The mitral valve is normal in structure. No evidence of mitral valve regurgitation. No evidence of mitral stenosis. Moderate mitral annular calcification.  5. The aortic valve has been repaired/replaced, bioprosthetic valve, s/p TAVR. Aortic valve regurgitation is not visualized. Aortic valve mean gradient measures 12.7 mmHg.  6. The inferior vena cava is normal in size with greater than 50% respiratory variability, suggesting right atrial pressure of 3 mmHg. FINDINGS  Left Ventricle: Left ventricular ejection fraction, by estimation, is 60 to 65%. The left ventricle has normal function. The left ventricle has no regional wall motion abnormalities. Definity contrast agent was given IV to delineate the left ventricular  endocardial borders. The left ventricular internal cavity size was normal in size. There is moderate left ventricular hypertrophy. Left ventricular diastolic parameters  are consistent with Grade I diastolic dysfunction (impaired relaxation). Right Ventricle: The right ventricular size is normal. No increase in right ventricular wall thickness. Right ventricular systolic function is normal. There is normal pulmonary artery systolic pressure. The tricuspid  regurgitant velocity is 2.58 m/s, and  with an assumed right atrial pressure of 8 mmHg, the estimated right ventricular systolic pressure is 34.6 mmHg. Left Atrium: Left atrial size was mildly dilated. Right Atrium: Right atrial size was normal in size. Pericardium: There is no evidence of pericardial effusion. Mitral Valve: The mitral valve is normal in structure. Moderate mitral annular calcification. No evidence of mitral valve regurgitation. No evidence of mitral valve stenosis. MV peak gradient, 9.1 mmHg. The mean mitral valve gradient is 4.0 mmHg. Tricuspid Valve: The tricuspid valve is normal in structure. Tricuspid valve regurgitation is mild . No evidence of tricuspid stenosis. Aortic Valve: The aortic valve has been repaired/replaced. Aortic valve regurgitation is not visualized. Aortic valve sclerosis is present, with no evidence of aortic valve stenosis. Aortic valve mean gradient measures 12.7 mmHg. Aortic valve peak gradient measures 25.1 mmHg. Aortic valve area, by VTI measures 1.40 cm. 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 Pulmonic Valve: The pulmonic valve was normal in structure. Pulmonic valve regurgitation is not visualized. No evidence of pulmonic stenosis. Aorta: The aortic root is normal in size and structure. Venous: The inferior vena cava is normal in size with greater than 50% respiratory variability, suggesting right atrial pressure of 3 mmHg. IAS/Shunts: No atrial level shunt detected by color flow Doppler.  LEFT VENTRICLE PLAX 2D LVIDd:         5.40 cm   Diastology LVIDs:         3.50 cm   LV e' medial:    7.51 cm/s LV PW:         1.30 cm   LV E/e' medial:  17.8 LV IVS:        1.50 cm   LV e' lateral:   10.90 cm/s LVOT diam:     2.00 cm   LV E/e' lateral: 12.3 LV SV:         72 LV SV Index:   31 LVOT Area:     3.14 cm  RIGHT VENTRICLE RV Basal diam:  4.90 cm RV S prime:     16.60 cm/s LEFT ATRIUM           Index        RIGHT ATRIUM           Index LA diam:       4.70 cm 2.01 cm/m   RA Area:     24.50 cm LA Vol (A4C): 63.0 ml 26.94 ml/m  RA Volume:   69.60 ml  29.76 ml/m  AORTIC VALVE                     PULMONIC VALVE AV Area (Vmax):    1.29 cm      PV Vmax:       1.47 m/s AV Area (Vmean):   1.22 cm      PV Peak grad:  8.6 mmHg AV Area (VTI):     1.40 cm AV Vmax:           250.33 cm/s AV Vmean:          160.000 cm/s AV VTI:            0.513 m AV Peak Grad:  25.1 mmHg AV Mean Grad:      12.7 mmHg LVOT Vmax:         102.50 cm/s LVOT Vmean:        62.250 cm/s LVOT VTI:          0.228 m LVOT/AV VTI ratio: 0.44  AORTA Ao Root diam: 3.60 cm MITRAL VALVE                TRICUSPID VALVE MV Area (PHT): 1.72 cm     TR Peak grad:   26.6 mmHg MV Area VTI:   1.43 cm     TR Vmax:        258.00 cm/s MV Peak grad:  9.1 mmHg MV Mean grad:  4.0 mmHg     SHUNTS MV Vmax:       1.51 m/s     Systemic VTI:  0.23 m MV Vmean:      88.8 cm/s    Systemic Diam: 2.00 cm MV Decel Time: 440 msec MV E velocity: 134.00 cm/s MV A velocity: 153.00 cm/s MV E/A ratio:  0.88 Julien Nordmann MD Electronically signed by Julien Nordmann MD Signature Date/Time: 07/31/2023/4:48:21 PM    Final      Assessment/Plan:  Staph epidermidis bacteremia --Fever and altered mental status  in a patient who had burr hole surgery for bi frontal subdural hematoma- - The Subdural drain had entered csf  - concern for meningitis VS infected hematomas /abscess CT/MRI has not shown abscess  Fever has resolved -On  vanco ( started 07/23/23)  day 10 - may need upto  4  weeks , depending on how he progresses and whether he would need further intervention for CNS infected hematoma  Watch closely vanco trough and creatinine  B/l subdural hematomas- s/p evacuation thru burr hole Planning to transfer to [facility] for embolization   Fluctuating encephalopathy- combination of subdural hematoma, surgery, Decompensated cirrhosis, hepatic encephalopathy and bacteremia     NASH with decompensated cirrhosis H/o TIPS  Pancytopenia  stable   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

## 2023-08-01 NOTE — Progress Notes (Signed)
                                                     Palliative Care Progress Note   Patient Name: Joshua Berry       Date: 08/01/2023 DOB: 11-Aug-1946  Age: 77 y.o. MRN#: [id number] Attending Physician: Gillis Santa, MD Primary Care Physician: [facility] Admit Date: 07/14/2023  Chart reviewed . No acute palliative needs at this time.   Patient is awaiting transfer to [facility] for further medical management.   Patient/family have PMT contact information and were encouraged to contact PMT with any future acute palliative needs.   PMT will monitor the patient peripherally and shadow his chart. Please re-engage with PMT if goals change, at patient/family's request, or if patient's health deteriorates during hospitalization.    Thank you for allowing the Palliative Medicine Team to assist in the care of [name].  Samara Deist L. Bonita Quin, DNP, FNP-BC Palliative Medicine Team  No charge

## 2023-08-01 NOTE — Telephone Encounter (Signed)
I moved his post op appt to 09/09/23 and I edited the "expected date" on the CT order.  Patty, please make sure they are aware of the new post op appointment date and the need for a repeat CT the week prior to the appointment.   *he is being transferred to [facility]. We will need to powershare any new imaging from [place] prior to his appt on 09/09/23

## 2023-08-01 NOTE — Telephone Encounter (Signed)
Noted.  I will work on the CT as we get closer and request imaging that was done at [facility].

## 2023-08-01 NOTE — Consult Note (Signed)
PHARMACY CONSULT NOTE - ELECTROLYTES  Pharmacy Consult for Electrolyte Monitoring and Replacement   Recent Labs: Height: 6\' 2"  (188 cm) Weight: 107.8 kg (237 lb 10.5 oz) IBW/kg (Calculated) : 82.2 Estimated Creatinine Clearance: 99.5 mL/min (by C-G formula based on SCr of 0.63 mg/dL). Potassium (mmol/L)  Date Value  08/01/2023 3.7  05/01/2014 4.2   Magnesium (mg/dL)  Date Value  16/09/9603 1.6 (L)   Calcium (mg/dL)  Date Value  54/08/8118 8.4 (L)   Calcium, Total (mg/dL)  Date Value  14/78/2956 9.4   Albumin (g/dL)  Date Value  21/30/8657 2.3 (L)  05/01/2014 4.2   Phosphorus (mg/dL)  Date Value  84/69/6295 2.7   Sodium (mmol/L)  Date Value  08/01/2023 135  05/01/2014 136   Corrected Ca: 8.9 mg/dL  Assessment  Joshua Berry is a 77 y.o. male presenting with SDH. PMH significant for NASH with liver cirrhosis, portal HTN, pancytopenia, OSA, s/p TAVR. Pharmacy has been consulted to monitor and replace electrolytes.  Mg 1.6 this AM needs replacement. All other electrolytes within normal limits.  Diet: dysphagia 1 MIVF: NS @ 75 mL/hr - MD contacted to dc Pertinent medications: lactulose PO and enema, NaCl 1g PO BID  Goal of Therapy: Electrolytes WNL  Plan:  Continue PO Na supplementation as ordered MgSukfate 2gm IV x 1 dose today  Phos check twice weekly (Mo & Th) - changed back to daily by MD Check CMP, Mg with AM labs  Thank you for allowing pharmacy to be a part of this patient's care.  Ossie Beltran Rodriguez-Guzman PharmD, BCPS 08/01/2023 7:25 AM

## 2023-08-01 NOTE — Progress Notes (Signed)
Pharmacy Antibiotic Note  Joshua Berry is a 77 y.o. male admitted on 07/14/2023 with meningitis.  Pharmacy has been consulted for vancomycin and cefepime dosing. Patient s/p elective neurosurgery bilateral burr holes on 7/29 for drainage of chronic SDH.  Bilateral ventriculostomy drains placed in OR and removed on 7/31.  Patient now having fever and altered mental status (PMH non-alcoholic cirrhosis with hepatic encephalopathy)  Today, 08/01/2023 Day #11 vancomycin  Tm/24h afebrile WBC 3.1 (stable) Renal: SCR WNL and at baseline- stable Blood cultures: 4/4 Botltles, BCID = MRSE   (prev blood cx from 7/29 were no growth) Repeat Blood cultures from 8/8 are NGTD  Vancomycin levels: 8/16 vancomycin trough at 05:31 on 1500mg  IV q12h = 16 mcg/mL (prior to 5th dose of new regimen), previous dose given at 18:28 on 8/15 8/9 vancomycin trough at 03:19 on 1250mg  IV q12h = 13 mcg/mL (prior to 5 maintenance dose), previous dose given at 16:36 8/8 8/13 vancomycin trough at  03:16 on 1250mg  IV q12h = 13 mcg/mL. Previous dose given at 17:11 on 8/12  Plan: Continue Vancomycin 1500mg  IV q12, trough today = 7mcg/ml.   Goal vancomycin trough 15-50mcg/mL since concern for CNS infection  Follow renal function  Recheck vancomycin trough in ~5 days or sooner if change in renal function Note patient with plan to transfer to [facility] for IR neurosurgery to perform emobilization   Height: 6\' 2"  (188 cm) Weight: 107.8 kg (237 lb 10.5 oz) IBW/kg (Calculated) : 82.2  Temp (24hrs), Avg:98.4 F (36.9 C), Min:98 F (36.7 C), Max:98.8 F (37.1 C)  Recent Labs  Lab 07/28/23 0448 07/29/23 0316 07/30/23 0315 07/31/23 0533 08/01/23 0531  WBC 3.6* 3.8* 3.0* 3.6* 3.1*  CREATININE 0.64 0.64 0.65 0.63 0.63  VANCOTROUGH  --  13*  --   --  16    Estimated Creatinine Clearance: 99.5 mL/min (by C-G formula based on SCr of 0.63 mg/dL).    Allergies  Allergen Reactions   Camphor Swelling and Rash   Latex Rash    Lisinopril Cough    Other Reaction(s): Cough   Sulfamethoxazole Swelling   Chocolate     Family states he is allergic   Nsaids Other (See Comments)    NASH    Antimicrobials this admission: 8/6 vanco >>  8/6 cefepime >> 8/7  Dose adjustments this admission:   Microbiology results: 8/8 Bcx: NGTD 8/6 BCx: MRSE 7/29 BCx: NG-F 7/29 MRSA PCR: neg  Thank you for allowing pharmacy to be a part of this patient's care.  Juliette Alcide, PharmD, BCPS, BCIDP Work Cell: [phone] 08/01/2023 11:54 AM

## 2023-08-05 ENCOUNTER — Inpatient Hospital Stay: Admit: 2023-08-05 | Payer: No Typology Code available for payment source | Admitting: Family Medicine

## 2023-08-05 ENCOUNTER — Telehealth: Payer: Self-pay | Admitting: Family Medicine

## 2023-08-05 ENCOUNTER — Encounter (HOSPITAL_COMMUNITY): Payer: Self-pay

## 2023-08-05 NOTE — Telephone Encounter (Signed)
77 yo male with baseline hx of NASH with liver cirrhosis, portal HTN, TIPS, GIB/GAVE, pancytopenia, OSA, TAVR, left shoulder replacement, multiple episodes of bacteremia requiring prolonged IV abx. Originally admitted 7/29-->8/16 at [facility] for issues including SDH s/p bilateral frontal burr hole washout done on 7/29, acute metabolic encephalopathy, MRSE bacteremia on IV vancomycin until 8/15, cirrhosis, chronic thrombocytopenia . Transferred 8/17 to [facility]  Middle meningial artery embolization. Per, [facility], pt stable to be discharged back to [facility] after procedure.  Please see [facility] discharge summary.  Vital signs Afebrile, BP 112/59, HR 71, satting well on RA  Hgb 10.4  Cr 0.71  Na 138 Signout from Effie Shy MD NSG resident.  I have asked for a full discharge summary from NSG as well as the hospitalist service to be completed prior to patient being transferred.

## 2023-08-12 ENCOUNTER — Telehealth: Payer: Self-pay | Admitting: Neurosurgery

## 2023-08-12 ENCOUNTER — Other Ambulatory Visit: Payer: Self-pay

## 2023-08-12 ENCOUNTER — Inpatient Hospital Stay
Admission: EM | Admit: 2023-08-12 | Discharge: 2023-08-20 | DRG: 070 | Disposition: A | Discharge status: Skilled Nursing Facility | Payer: No Typology Code available for payment source | Source: Skilled Nursing Facility | Attending: Internal Medicine | Admitting: Internal Medicine

## 2023-08-12 ENCOUNTER — Emergency Department: Payer: No Typology Code available for payment source

## 2023-08-12 ENCOUNTER — Inpatient Hospital Stay
Admit: 2023-08-12 | Discharge: 2023-08-12 | Disposition: A | Discharge status: Home / Self Care | Payer: Self-pay | Attending: Neurosurgery | Admitting: Neurosurgery

## 2023-08-12 DIAGNOSIS — L89151 Pressure ulcer of sacral region, stage 1: Secondary | ICD-10-CM | POA: Insufficient documentation

## 2023-08-12 DIAGNOSIS — K7682 Hepatic encephalopathy: Secondary | ICD-10-CM | POA: Diagnosis present

## 2023-08-12 DIAGNOSIS — I5032 Chronic diastolic (congestive) heart failure: Secondary | ICD-10-CM | POA: Diagnosis present

## 2023-08-12 DIAGNOSIS — B957 Other staphylococcus as the cause of diseases classified elsewhere: Secondary | ICD-10-CM | POA: Diagnosis present

## 2023-08-12 DIAGNOSIS — Z049 Encounter for examination and observation for unspecified reason: Secondary | ICD-10-CM

## 2023-08-12 DIAGNOSIS — R7881 Bacteremia: Secondary | ICD-10-CM | POA: Diagnosis present

## 2023-08-12 DIAGNOSIS — E871 Hypo-osmolality and hyponatremia: Secondary | ICD-10-CM | POA: Diagnosis present

## 2023-08-12 DIAGNOSIS — Z79899 Other long term (current) drug therapy: Secondary | ICD-10-CM

## 2023-08-12 DIAGNOSIS — Z91040 Latex allergy status: Secondary | ICD-10-CM

## 2023-08-12 DIAGNOSIS — K219 Gastro-esophageal reflux disease without esophagitis: Secondary | ICD-10-CM | POA: Diagnosis present

## 2023-08-12 DIAGNOSIS — I10 Essential (primary) hypertension: Secondary | ICD-10-CM | POA: Diagnosis present

## 2023-08-12 DIAGNOSIS — K7581 Nonalcoholic steatohepatitis (NASH): Secondary | ICD-10-CM | POA: Diagnosis present

## 2023-08-12 DIAGNOSIS — K7460 Unspecified cirrhosis of liver: Secondary | ICD-10-CM | POA: Diagnosis present

## 2023-08-12 DIAGNOSIS — Z66 Do not resuscitate: Secondary | ICD-10-CM | POA: Diagnosis present

## 2023-08-12 DIAGNOSIS — G9341 Metabolic encephalopathy: Principal | ICD-10-CM | POA: Diagnosis present

## 2023-08-12 DIAGNOSIS — Z794 Long term (current) use of insulin: Secondary | ICD-10-CM

## 2023-08-12 DIAGNOSIS — Z952 Presence of prosthetic heart valve: Secondary | ICD-10-CM

## 2023-08-12 DIAGNOSIS — E039 Hypothyroidism, unspecified: Secondary | ICD-10-CM | POA: Diagnosis present

## 2023-08-12 DIAGNOSIS — E785 Hyperlipidemia, unspecified: Secondary | ICD-10-CM | POA: Diagnosis present

## 2023-08-12 DIAGNOSIS — M25531 Pain in right wrist: Secondary | ICD-10-CM | POA: Insufficient documentation

## 2023-08-12 DIAGNOSIS — Z85820 Personal history of malignant melanoma of skin: Secondary | ICD-10-CM

## 2023-08-12 DIAGNOSIS — L89321 Pressure ulcer of left buttock, stage 1: Secondary | ICD-10-CM | POA: Diagnosis present

## 2023-08-12 DIAGNOSIS — M19031 Primary osteoarthritis, right wrist: Secondary | ICD-10-CM | POA: Diagnosis present

## 2023-08-12 DIAGNOSIS — I6200 Nontraumatic subdural hemorrhage, unspecified: Secondary | ICD-10-CM | POA: Diagnosis present

## 2023-08-12 DIAGNOSIS — Z882 Allergy status to sulfonamides status: Secondary | ICD-10-CM

## 2023-08-12 DIAGNOSIS — S065XAA Traumatic subdural hemorrhage with loss of consciousness status unknown, initial encounter: Secondary | ICD-10-CM | POA: Diagnosis present

## 2023-08-12 DIAGNOSIS — Z79890 Hormone replacement therapy (postmenopausal): Secondary | ICD-10-CM

## 2023-08-12 DIAGNOSIS — I110 Hypertensive heart disease with heart failure: Secondary | ICD-10-CM | POA: Diagnosis present

## 2023-08-12 DIAGNOSIS — Z1152 Encounter for screening for COVID-19: Secondary | ICD-10-CM

## 2023-08-12 DIAGNOSIS — Z8249 Family history of ischemic heart disease and other diseases of the circulatory system: Secondary | ICD-10-CM

## 2023-08-12 DIAGNOSIS — D61818 Other pancytopenia: Secondary | ICD-10-CM | POA: Diagnosis present

## 2023-08-12 DIAGNOSIS — L89311 Pressure ulcer of right buttock, stage 1: Secondary | ICD-10-CM | POA: Diagnosis present

## 2023-08-12 DIAGNOSIS — Z6826 Body mass index (BMI) 26.0-26.9, adult: Secondary | ICD-10-CM

## 2023-08-12 DIAGNOSIS — E1165 Type 2 diabetes mellitus with hyperglycemia: Secondary | ICD-10-CM | POA: Diagnosis present

## 2023-08-12 DIAGNOSIS — G935 Compression of brain: Secondary | ICD-10-CM | POA: Diagnosis present

## 2023-08-12 DIAGNOSIS — E440 Moderate protein-calorie malnutrition: Secondary | ICD-10-CM | POA: Insufficient documentation

## 2023-08-12 DIAGNOSIS — Z7985 Long-term (current) use of injectable non-insulin antidiabetic drugs: Secondary | ICD-10-CM

## 2023-08-12 DIAGNOSIS — R410 Disorientation, unspecified: Principal | ICD-10-CM

## 2023-08-12 DIAGNOSIS — Z87891 Personal history of nicotine dependence: Secondary | ICD-10-CM

## 2023-08-12 DIAGNOSIS — R4182 Altered mental status, unspecified: Secondary | ICD-10-CM | POA: Diagnosis present

## 2023-08-12 DIAGNOSIS — G4733 Obstructive sleep apnea (adult) (pediatric): Secondary | ICD-10-CM | POA: Diagnosis present

## 2023-08-12 LAB — CBC
HCT: 34.3 % — ABNORMAL LOW (ref 39.0–52.0)
Hemoglobin: 11.9 g/dL — ABNORMAL LOW (ref 13.0–17.0)
MCH: 32.7 pg (ref 26.0–34.0)
MCHC: 34.7 g/dL (ref 30.0–36.0)
MCV: 94.2 fL (ref 80.0–100.0)
Platelets: 75 10*3/uL — ABNORMAL LOW (ref 150–400)
RBC: 3.64 MIL/uL — ABNORMAL LOW (ref 4.22–5.81)
RDW: 15.8 % — ABNORMAL HIGH (ref 11.5–15.5)
WBC: 5.5 10*3/uL (ref 4.0–10.5)
nRBC: 0 % (ref 0.0–0.2)

## 2023-08-12 LAB — URINALYSIS, W/ REFLEX TO CULTURE (INFECTION SUSPECTED)
Bacteria, UA: NONE SEEN
Bilirubin Urine: NEGATIVE
Glucose, UA: NEGATIVE mg/dL
Hgb urine dipstick: NEGATIVE
Ketones, ur: NEGATIVE mg/dL
Leukocytes,Ua: NEGATIVE
Nitrite: NEGATIVE
Protein, ur: NEGATIVE mg/dL
Specific Gravity, Urine: 1.015 (ref 1.005–1.030)
Squamous Epithelial / HPF: NONE SEEN /HPF (ref 0–5)
pH: 6 (ref 5.0–8.0)

## 2023-08-12 LAB — BASIC METABOLIC PANEL
Anion gap: 8 (ref 5–15)
BUN: 10 mg/dL (ref 8–23)
CO2: 24 mmol/L (ref 22–32)
Calcium: 9.2 mg/dL (ref 8.9–10.3)
Chloride: 105 mmol/L (ref 98–111)
Creatinine, Ser: 0.64 mg/dL (ref 0.61–1.24)
GFR, Estimated: >60 mL/min (ref >60)
Glucose, Bld: 116 mg/dL — ABNORMAL HIGH (ref 70–99)
Potassium: 4 mmol/L (ref 3.5–5.1)
Sodium: 137 mmol/L (ref 135–145)

## 2023-08-12 LAB — AMMONIA: Ammonia: 54 umol/L — ABNORMAL HIGH (ref 9–35)

## 2023-08-12 NOTE — ED Provider Notes (Signed)
[facility] Provider Note    Event Date/Time   First MD Initiated Contact with Patient 08/12/23 1924     (approximate)   History   Chief Complaint: Altered Mental Status   HPI  Joshua Berry is a 77 y.o. male with a past history of cirrhosis, diabetes, hypertension, and bilateral subdural hematoma who was brought to the ED due to increased confusion over the past several days.  No falls or new trauma or fever.  To me patient is able to follow some very simple basic commands but not able to communicate and unable to maintain attention.  Outside records reviewed noting that he had bilateral bur holes by neurosurgery on July 14, 2023, and then due to declining mental status while recovering in the hospital, transferred to [facility] and had bilateral middle meningeal artery embolizations on 08/04/2023.     Physical Exam   Triage Vital Signs: ED Triage Vitals  Encounter Vitals Group     BP 08/12/23 1636 (!) 117/55     Systolic BP Percentile --      Diastolic BP Percentile --      Pulse Rate 08/12/23 1636 75     Resp 08/12/23 1636 14     Temp 08/12/23 1636 98.9 F (37.2 C)     Temp Source 08/12/23 1636 Oral     SpO2 08/12/23 1636 99 %     Weight 08/12/23 1639 235 lb 14.3 oz (107 kg)     Height 08/12/23 1639 6\' 2"  (1.88 m)     Head Circumference --      Peak Flow --      Pain Score 08/12/23 1639 0     Pain Loc --      Pain Education --      Exclude from Growth Chart --     Most recent vital signs: Vitals:   08/12/23 2315 08/12/23 2330  BP:  103/70  Pulse: 66 61  Resp:    Temp:    SpO2: 93% 97%    General: Awake, no distress.  Not oriented CV:  Good peripheral perfusion.  Regular rate and rhythm Resp:  Normal effort.  Clear to auscultation bilaterally Abd:  No distention.  Soft nontender Other:  No lower extremity swelling or inflammatory changes.  Bur hole incisions well-healed and not inflamed   ED Results / Procedures / Treatments    Labs (all labs ordered are listed, but only abnormal results are displayed) Labs Reviewed  CBC - Abnormal; Notable for the following components:      Result Value   RBC 3.64 (*)    Hemoglobin 11.9 (*)    HCT 34.3 (*)    RDW 15.8 (*)    Platelets 75 (*)    All other components within normal limits  BASIC METABOLIC PANEL - Abnormal; Notable for the following components:   Glucose, Bld 116 (*)    All other components within normal limits  AMMONIA - Abnormal; Notable for the following components:   Ammonia 54 (*)    All other components within normal limits  URINALYSIS, W/ REFLEX TO CULTURE (INFECTION SUSPECTED) - Abnormal; Notable for the following components:   Color, Urine YELLOW (*)    APPearance CLEAR (*)    All other components within normal limits  SARS CORONAVIRUS 2 BY RT PCR     EKG Interpreted by me Sinus rhythm rate of 79.  Left axis, first-degree AV block.  Poor R wave progression.  Normal ST segments  and T waves.  Frequent PACs   RADIOLOGY CT head reveals bilateral subdurals, somewhat decreased in size compared to recent previous CT.  No acute appearing bleeding.   PROCEDURES:  Procedures   MEDICATIONS ORDERED IN ED: Medications - No data to display   IMPRESSION / MDM / ASSESSMENT AND PLAN / ED COURSE  I reviewed the triage vital signs and the nursing notes.  DDx: Cerebral hemorrhage, hepatic encephalopathy, dehydration, AKI, anemia, UTI, COVID  Patient's presentation is most consistent with acute presentation with potential threat to life or bodily function.  Patient presents with acute altered mental status in the setting of recently managed bilateral subdural hematoma.  He is not in distress, maintaining his airway, vitals are normal, and initial lab workup is all reassuring.  Will need to hospitalize overnight for further neurology and neurosurgery reevaluation.  ----------------------------------------- 11:54 PM on  08/12/2023 ----------------------------------------- Case discussed with hospitalist       FINAL CLINICAL IMPRESSION(S) / ED DIAGNOSES   Final diagnoses:  Disorientation  Bilateral subdural hematomas (HCC)     Rx / DC Orders   ED Discharge Orders     None        Note:  This document was prepared using Dragon voice recognition software and may include unintentional dictation errors.   Sharman Cheek, MD 08/12/23 [phone]

## 2023-08-12 NOTE — Telephone Encounter (Signed)
Daugher in-law just called back. [facility] is going to transport him to [facility].

## 2023-08-12 NOTE — Telephone Encounter (Signed)
burr holes on 07/14/23 At [facility] is calling, pt's daughter in-law. Patient is lethargic and not himself today.  He just wants to be sleeping. He is acting the same way like when he had the collection of blood in the brain. Family is worried and would like to take him to the ER. Would you recommend [facility] or [facility]?

## 2023-08-12 NOTE — ED Notes (Signed)
Patient transported to CT 

## 2023-08-12 NOTE — H&P (Incomplete)
History and Physical    Joshua Berry WUJ:[id number] DOB: May 20, 1946 DOA: 08/12/2023  Referring MD/NP/PA:   PCP: [facility]   Patient coming from:  The patient is coming from home.     Chief Complaint:   HPI: Joshua Berry is a 77 y.o. male with medical history significant of      Data reviewed independently and ED Course: pt was found to have     ***       EKG: I have personally reviewed.  Not done in ED, will get one.   ***   Review of Systems:   General: no fevers, chills, no body weight gain, has poor appetite, has fatigue HEENT: no blurry vision, hearing changes or sore throat Respiratory: no dyspnea, coughing, wheezing CV: no chest pain, no palpitations GI: no nausea, vomiting, abdominal pain, diarrhea, constipation GU: no dysuria, burning on urination, increased urinary frequency, hematuria  Ext: no leg edema Neuro: no unilateral weakness, numbness, or tingling, no vision change or hearing loss Skin: no rash, no skin tear. MSK: No muscle spasm, no deformity, no limitation of range of movement in spin Heme: No easy bruising.  Travel history: No recent long distant travel.   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    Past Surgical History:  Procedure Laterality Date   BURR HOLE Bilateral 07/14/2023   Procedure: BURR HOLES;  Surgeon: Venetia Night, MD;  Location: [facility] ORS;  Service: Neurosurgery;  Laterality: Bilateral;   CHOLECYSTECTOMY     COLONOSCOPY N/A 07/23/2019   Procedure: COLONOSCOPY;  Surgeon: Toney Reil, MD;  Location: [facility] ENDOSCOPY;  Service: Gastroenterology;  Laterality: N/A;   ESOPHAGOGASTRODUODENOSCOPY N/A 07/20/2019   Procedure: ESOPHAGOGASTRODUODENOSCOPY (EGD);  Surgeon: Toney Reil, MD;  Location: [facility] ENDOSCOPY;  Service: Gastroenterology;  Laterality: N/A;   ESOPHAGOGASTRODUODENOSCOPY Left 08/20/2019   Procedure: ESOPHAGOGASTRODUODENOSCOPY (EGD);  Surgeon: Pasty Spillers, MD;  Location: [facility] ENDOSCOPY;  Service: Endoscopy;  Laterality: Left;   ESOPHAGOGASTRODUODENOSCOPY (EGD) WITH PROPOFOL N/A 04/25/2021   Procedure: ESOPHAGOGASTRODUODENOSCOPY (EGD) WITH PROPOFOL;  Surgeon: Toney Reil, MD;  Location: [facility] ENDOSCOPY;  Service: Gastroenterology;  Laterality: N/A;   MECHANICAL AORTIC VALVE REPLACEMENT     SKIN BIOPSY     TEE WITHOUT CARDIOVERSION N/A 12/23/2018   Procedure: TRANSESOPHAGEAL ECHOCARDIOGRAM (TEE);  Surgeon: Dalia Heading, MD;  Location: [facility] ORS;  Service: Cardiovascular;  Laterality: N/A;   TEE WITHOUT CARDIOVERSION N/A 05/21/2022   Procedure: TRANSESOPHAGEAL ECHOCARDIOGRAM (TEE);  Surgeon: Antonieta Iba, MD;  Location: [facility] ORS;  Service: Cardiovascular;  Laterality: N/A;   TEE WITHOUT CARDIOVERSION N/A 04/09/2023   Procedure: TRANSESOPHAGEAL ECHOCARDIOGRAM;  Surgeon: Debbe Odea, MD;  Location: [facility] ORS;  Service: Cardiovascular;  Laterality:  N/A;    Social History:  reports that he quit smoking about 54 years ago. His smoking use included cigarettes. He started smoking about 59 years ago. He has a 2.5 pack-year smoking history. He has never used smokeless tobacco. He reports that he does not currently use alcohol. He reports that he does not currently use drugs.  Family History:  Family History  Problem Relation Age of Onset   Heart attack Brother       Prior to Admission medications   Medication Sig Start Date End Date Taking? Authorizing Provider  acetaminophen (TYLENOL) 500 MG tablet Take 1 tablet (500 mg total) by mouth every 6 (six) hours as needed for moderate pain, fever, headache or mild pain (headache). 07/31/23   Gillis Santa, MD  ASPERCREME LIDOCAINE 4 % LIQD Apply 1 Application topically daily as needed. 06/07/22   [provider]  atorvastatin (LIPITOR) 20 MG tablet Take 20 mg by mouth at bedtime.    [provider]  augmented betamethasone dipropionate (DIPROLENE-AF) 0.05 % cream Apply 1 application topically 2 (two) times daily.    [provider]  bisacodyl (DULCOLAX) 5 MG EC tablet Take 2 tablets (10 mg total) by mouth 2 (two) times daily. 07/31/23   Gillis Santa, MD  butalbital-acetaminophen-caffeine (FIORICET) [phone] MG tablet Take 2 tablets by mouth every 8 (eight) hours as needed for headache. 07/31/23   Gillis Santa, MD  Cholecalciferol 50 MCG (2000 UT) TABS Take 1 tablet by mouth daily.    [provider]  clotrimazole (LOTRIMIN) 1 % external solution Apply 1 Application topically 2 (two) times daily. Apply to toe nails 02/12/23   [provider]  famotidine (PEPCID) 20 MG tablet Take 20 mg by mouth daily.    [provider]  feeding supplement (ENSURE ENLIVE / ENSURE PLUS) LIQD Take 237 mLs by mouth 3 (three) times daily between meals. 07/31/23   Gillis Santa, MD  folic acid (FOLVITE) 1 MG tablet Take 1 tablet by mouth daily. 12/03/19   [provider]  furosemide (LASIX) 20 MG tablet Take 1 tablet (20 mg total) by mouth daily. Patient taking differently: Take 20 mg by mouth daily. Only Monday, Wednesday and Friday 04/07/22   Arnetha Courser, MD  insulin aspart (NOVOLOG) 100 UNIT/ML injection Inject 10 Units into the skin 3 (three) times daily before meals. 04/26/21   Rhetta Mura, MD  insulin glargine (LANTUS) 100 UNIT/ML injection Inject 0.14 mLs  (14 Units total) into the skin daily. Patient taking differently: Inject 18 Units into the skin daily. 08/05/22   Hollice Espy, MD  ketotifen (ZADITOR) 0.025 % ophthalmic solution Place 1 drop into both eyes 2 (two) times daily.    [provider]  Lactobacillus Rhamnosus, GG, (RA PROBIOTIC DIGESTIVE CARE) CAPS Take 1 capsule by mouth daily.    [provider]  lactulose (CHRONULAC) 10 GM/15ML solution Take 45 mLs (30 g total) by mouth 3 (three) times daily. Patient taking differently: Take 30-45 mLs by mouth at bedtime. 45 ml @0800 /1400. 30 ml QHS 06/18/23   Agbata, Tochukwu, MD  levETIRAcetam (KEPPRA) 250 MG tablet Take 1 tablet (250 mg total) by mouth 2 (two) times daily. 07/31/23   Gillis Santa, MD  levothyroxine (SYNTHROID) 100 MCG tablet Take 100 mcg by mouth daily before breakfast.    [provider]  lidocaine (LIDODERM) 5 % Place 2 patches onto the skin daily. Remove & Discard patch within 12 hours or as directed by MD 05/28/22   Fran Lowes,  Inetta Fermo, MD  magnesium oxide (MAG-OX) 400 (240 Mg) MG tablet Take 1 tablet (400 mg total) by mouth 2 (two) times daily. 07/31/23   Gillis Santa, MD  magnesium oxide (MAG-OX) 400 MG tablet Take 2 tablets (800 mg total) by mouth 2 (two) times daily. 05/28/22   [name], MD  Multiple Vitamin (MULTIVITAMIN WITH MINERALS) TABS tablet Take 1 tablet by mouth daily. 08/01/23   Gillis Santa, MD  Omega-3 Fatty Acids (FISH OIL) 1000 MG CAPS Take 1 capsule by mouth 2 (two) times daily.    [provider]  pantoprazole (PROTONIX) 40 MG tablet Take 1 tablet (40 mg total) by mouth 2 (two) times daily. 04/07/22 07/14/23  Arnetha Courser, MD  rifaximin (XIFAXAN) 550 MG TABS tablet Take 550 mg by mouth 2 (two) times daily. Takes with lactulose    [provider]  Semaglutide,0.25 or 0.5MG /DOS, 2 MG/1.5ML SOPN Inject 0.5 mg into the skin once a week. Thursday    [provider]  terazosin (HYTRIN) 5 MG capsule Take 5 mg by mouth at  bedtime.    [provider]  trolamine salicylate (ASPERCREME) 10 % cream Apply topically as needed for muscle pain. 05/28/22   [name], MD  vancomycin HCl (VANCOREADY) 1500 MG/300ML SOLN Inject 300 mLs (1,500 mg total) into the vein every 12 (twelve) hours. 07/31/23   Gillis Santa, MD  vitamin C (ASCORBIC ACID) 500 MG tablet Take 1,000 mg by mouth daily.    [provider]  zinc oxide 20 % ointment Apply 1 Application topically in the morning and at bedtime. APPLY SMALL AMOUNT TO AFFECTED AREA TWICE A DAY FOR BUTTOCK RASH FOR BUTTOCK RASH 05/13/23   [provider]    Physical Exam: Vitals:   08/12/23 2245 08/12/23 2300 08/12/23 2315 08/12/23 2330  BP:  112/65  103/70  Pulse: 62 62 66 61  Resp:      Temp:      TempSrc:      SpO2: 97% 92% 93% 97%  Weight:      Height:       General: Not in acute distress HEENT:       Eyes: PERRL, EOMI, no jaundice       ENT: No discharge from the ears and nose, no pharynx injection, no tonsillar enlargement.        Neck: No JVD, no bruit, no mass felt. Heme: No neck lymph node enlargement. Cardiac: S1/S2, RRR, No murmurs, No gallops or rubs. Respiratory: No rales, wheezing, rhonchi or rubs. GI: Soft, nondistended, nontender, no rebound pain, no organomegaly, BS present. GU: No hematuria Ext: No pitting leg edema bilaterally. 1+DP/PT pulse bilaterally. Musculoskeletal: No joint deformities, No joint redness or warmth, no limitation of ROM in spin. Skin: No rashes.  Neuro: Alert, oriented X3, cranial nerves II-XII grossly intact, moves all extremities normally. Muscle strength 5/5 in all extremities, sensation to light touch intact. Brachial reflex 2+ bilaterally. Knee reflex 1+ bilaterally. Negative Babinski's sign. Normal finger to nose test. Psych: Patient is not psychotic, no suicidal or hemocidal ideation.  Labs on Admission: I have personally reviewed following labs and imaging studies  CBC: Recent Labs  Lab  08/12/23 1659  WBC 5.5  HGB 11.9*  HCT 34.3*  MCV 94.2  PLT 75*   Basic Metabolic Panel: Recent Labs  Lab 08/12/23 1659  NA 137  K 4.0  CL 105  CO2 24  GLUCOSE 116*  BUN 10  CREATININE 0.64  CALCIUM 9.2   GFR:  Estimated Creatinine Clearance: 88.5 mL/min (by C-G formula based on SCr of 0.64 mg/dL). Liver Function Tests: No results for input(s): "AST", "ALT", "ALKPHOS", "BILITOT", "PROT", "ALBUMIN" in the last 168 hours. No results for input(s): "LIPASE", "AMYLASE" in the last 168 hours. Recent Labs  Lab 08/12/23 2017  AMMONIA 54*   Coagulation Profile: No results for input(s): "INR", "PROTIME" in the last 168 hours. Cardiac Enzymes: No results for input(s): "CKTOTAL", "CKMB", "CKMBINDEX", "TROPONINI" in the last 168 hours. BNP (last 3 results) No results for input(s): "PROBNP" in the last 8760 hours. HbA1C: No results for input(s): "HGBA1C" in the last 72 hours. CBG: No results for input(s): "GLUCAP" in the last 168 hours. Lipid Profile: No results for input(s): "CHOL", "HDL", "LDLCALC", "TRIG", "CHOLHDL", "LDLDIRECT" in the last 72 hours. Thyroid Function Tests: No results for input(s): "TSH", "T4TOTAL", "FREET4", "T3FREE", "THYROIDAB" in the last 72 hours. Anemia Panel: No results for input(s): "VITAMINB12", "FOLATE", "FERRITIN", "TIBC", "IRON", "RETICCTPCT" in the last 72 hours. Urine analysis:    Component Value Date/Time   COLORURINE YELLOW (A) 08/12/2023 2017   APPEARANCEUR CLEAR (A) 08/12/2023 2017   APPEARANCEUR Hazy 05/01/2014 1653   LABSPEC 1.015 08/12/2023 2017   LABSPEC 1.015 05/01/2014 1653   PHURINE 6.0 08/12/2023 2017   GLUCOSEU NEGATIVE 08/12/2023 2017   GLUCOSEU Negative 05/01/2014 1653   HGBUR NEGATIVE 08/12/2023 2017   BILIRUBINUR NEGATIVE 08/12/2023 2017   BILIRUBINUR Negative 05/01/2014 1653   KETONESUR NEGATIVE 08/12/2023 2017   PROTEINUR NEGATIVE 08/12/2023 2017   NITRITE NEGATIVE 08/12/2023 2017   LEUKOCYTESUR NEGATIVE 08/12/2023  2017   LEUKOCYTESUR Negative 05/01/2014 1653   Sepsis Labs: @LABRCNTIP (procalcitonin:4,lacticidven:4) )No results found for this or any previous visit (from the past 240 hour(s)).   Radiological Exams on Admission: CT Head Wo Contrast  Result Date: 08/12/2023 CLINICAL DATA:  Mental status change of unknown cause EXAM: CT HEAD WITHOUT CONTRAST TECHNIQUE: Contiguous axial images were obtained from the base of the skull through the vertex without intravenous contrast. RADIATION DOSE REDUCTION: This exam was performed according to the departmental dose-optimization program which includes automated exposure control, adjustment of the mA and/or kV according to patient size and/or use of iterative reconstruction technique. COMPARISON:  07/28/2023 FINDINGS: Brain: No focal abnormality affects the brainstem or cerebellum. Bilateral low to intermediate density subdural collections persist, maximal thickness on the right 11 mm compared with 13 mm 2 weeks ago. Maximal thickness on the left is 6 mm, compared with 7 mm 2 weeks ago. Resolution of previously seen subdural air. Mass effect persists with right to left shift of 6 mm, unchanged. No evidence of parenchymal infarction. No hydrocephalus. Vascular: There is atherosclerotic calcification of the major vessels at the base of the brain. Skull: No acute finding.  Bilateral burr holes. Sinuses/Orbits: Clear/normal Other: None IMPRESSION: Bilateral low to intermediate density subdural collections persist, maximal thickness on the right 11 mm compared with 13 mm 2 weeks ago. Maximal thickness on the left 6 mm, compared with 7 mm 2 weeks ago. Resolution of previously seen subdural air. Mass effect persists with right to left shift of 6 mm, unchanged. No worsening finding. Electronically Signed   By: Paulina Fusi M.D.   On: 08/12/2023 17:08      Assessment/Plan Active Problems:   * No active hospital problems. *   Assessment and Plan: No notes have been filed  under this hospital service. Service: Hospitalist      Active Problems:   * No active hospital problems. *  DVT ppx: SQ Heparin         SQ Lovenox  Code Status: Full code   ***  Family Communication: not done, no family member is at bed side.              Yes, patient's    at bed side.       by phone  Disposition Plan:  Anticipate discharge back to previous environment  Consults called:    Admission status and Level of care: :    Med-surg bed for obs as inpt    progressive unit for obs   as inpt      SDU/inpation        {Inpatient:23812}  Dispo: The patient is from: {From:23814}              Anticipated d/c is to: {To:23815}              Anticipated d/c date is: {Days:23816}              Patient currently {Medically stable:23817}    Severity of Illness:  {Observation/Inpatient:21159}       Date of Service 08/12/2023    Lorretta Harp [facility]   If 7PM-7AM, please contact night-coverage [email or web address] 08/12/2023, 11:47 PM

## 2023-08-12 NOTE — ED Triage Notes (Signed)
Pt to ED [facility] from [facility] for increased AMS according to family for unknown time. Staff report pt at baseline for the past 4 days that he has been in facility. In rehab after brain bleed.  Pt alert, answers questions appropriately, not following all commands, short attention span.

## 2023-08-13 DIAGNOSIS — K219 Gastro-esophageal reflux disease without esophagitis: Secondary | ICD-10-CM

## 2023-08-13 DIAGNOSIS — E440 Moderate protein-calorie malnutrition: Secondary | ICD-10-CM | POA: Insufficient documentation

## 2023-08-13 DIAGNOSIS — S065XAA Traumatic subdural hemorrhage with loss of consciousness status unknown, initial encounter: Secondary | ICD-10-CM

## 2023-08-13 DIAGNOSIS — E039 Hypothyroidism, unspecified: Secondary | ICD-10-CM

## 2023-08-13 DIAGNOSIS — E785 Hyperlipidemia, unspecified: Secondary | ICD-10-CM

## 2023-08-13 DIAGNOSIS — L89151 Pressure ulcer of sacral region, stage 1: Secondary | ICD-10-CM | POA: Insufficient documentation

## 2023-08-13 LAB — RESPIRATORY PANEL BY PCR

## 2023-08-13 LAB — HEPATIC FUNCTION PANEL
ALT: 64 U/L — ABNORMAL HIGH (ref 0–44)
AST: 67 U/L — ABNORMAL HIGH (ref 15–41)
Albumin: 2.8 g/dL — ABNORMAL LOW (ref 3.5–5.0)
Alkaline Phosphatase: 108 U/L (ref 38–126)
Bilirubin, Direct: 0.4 mg/dL — ABNORMAL HIGH (ref 0.0–0.2)
Indirect Bilirubin: 1.5 mg/dL — ABNORMAL HIGH (ref 0.3–0.9)
Total Bilirubin: 1.9 mg/dL — ABNORMAL HIGH (ref 0.3–1.2)
Total Protein: 6.5 g/dL (ref 6.5–8.1)

## 2023-08-13 LAB — CBC
HCT: 34.8 % — ABNORMAL LOW (ref 39.0–52.0)
Hemoglobin: 11.9 g/dL — ABNORMAL LOW (ref 13.0–17.0)
MCH: 32.7 pg (ref 26.0–34.0)
MCHC: 34.2 g/dL (ref 30.0–36.0)
MCV: 95.6 fL (ref 80.0–100.0)
Platelets: 75 10*3/uL — ABNORMAL LOW (ref 150–400)
RBC: 3.64 MIL/uL — ABNORMAL LOW (ref 4.22–5.81)
RDW: 15.9 % — ABNORMAL HIGH (ref 11.5–15.5)
WBC: 4.8 10*3/uL (ref 4.0–10.5)
nRBC: 0 % (ref 0.0–0.2)

## 2023-08-13 LAB — BASIC METABOLIC PANEL
Anion gap: 10 (ref 5–15)
BUN: 10 mg/dL (ref 8–23)
CO2: 24 mmol/L (ref 22–32)
Calcium: 9.1 mg/dL (ref 8.9–10.3)
Chloride: 101 mmol/L (ref 98–111)
Creatinine, Ser: 0.76 mg/dL (ref 0.61–1.24)
GFR, Estimated: >60 mL/min (ref >60)
Glucose, Bld: 128 mg/dL — ABNORMAL HIGH (ref 70–99)
Potassium: 3.9 mmol/L (ref 3.5–5.1)
Sodium: 135 mmol/L (ref 135–145)

## 2023-08-13 LAB — APTT: aPTT: 38 seconds — ABNORMAL HIGH (ref 24–36)

## 2023-08-13 LAB — GLUCOSE, CAPILLARY
Glucose-Capillary: 119 mg/dL — ABNORMAL HIGH (ref 70–99)
Glucose-Capillary: 135 mg/dL — ABNORMAL HIGH (ref 70–99)
Glucose-Capillary: 186 mg/dL — ABNORMAL HIGH (ref 70–99)
Glucose-Capillary: 150 mg/dL — ABNORMAL HIGH (ref 70–99)
Glucose-Capillary: 145 mg/dL — ABNORMAL HIGH (ref 70–99)

## 2023-08-13 LAB — PROTIME-INR
INR: 1.4 — ABNORMAL HIGH (ref 0.8–1.2)
Prothrombin Time: 17.8 seconds — ABNORMAL HIGH (ref 11.4–15.2)

## 2023-08-13 LAB — SARS CORONAVIRUS 2 BY RT PCR: SARS Coronavirus 2 by RT PCR: NEGATIVE

## 2023-08-13 LAB — BRAIN NATRIURETIC PEPTIDE: B Natriuretic Peptide: 23.8 pg/mL (ref 0.0–100.0)

## 2023-08-13 MED ORDER — ENSURE ENLIVE PO LIQD
237.0000 mL | Freq: Three times a day (TID) | ORAL | Status: DC
  Administered 2023-08-13 – 2023-08-20 (×22): 237 mL via ORAL

## 2023-08-13 MED ORDER — INSULIN ASPART 100 UNIT/ML IJ SOLN
0.0000 [IU] | Freq: Three times a day (TID) | INTRAMUSCULAR | Status: DC
  Administered 2023-08-13: 2 [IU] via SUBCUTANEOUS
  Administered 2023-08-13 (×2): 1 [IU] via SUBCUTANEOUS
  Administered 2023-08-14 (×2): 2 [IU] via SUBCUTANEOUS
  Administered 2023-08-15: 3 [IU] via SUBCUTANEOUS
  Administered 2023-08-15: 2 [IU] via SUBCUTANEOUS
  Administered 2023-08-16: 1 [IU] via SUBCUTANEOUS
  Administered 2023-08-16: 3 [IU] via SUBCUTANEOUS
  Administered 2023-08-17: 2 [IU] via SUBCUTANEOUS
  Administered 2023-08-17: 1 [IU] via SUBCUTANEOUS
  Administered 2023-08-17: 3 [IU] via SUBCUTANEOUS
  Administered 2023-08-18: 1 [IU] via SUBCUTANEOUS
  Administered 2023-08-18 – 2023-08-19 (×2): 2 [IU] via SUBCUTANEOUS
  Administered 2023-08-19 – 2023-08-20 (×2): 1 [IU] via SUBCUTANEOUS
  Administered 2023-08-20: 2 [IU] via SUBCUTANEOUS
  Filled 2023-08-13 (×14): qty 1

## 2023-08-13 MED ORDER — OMEGA-3-ACID ETHYL ESTERS 1 G PO CAPS
1.0000 g | ORAL_CAPSULE | Freq: Every day | ORAL | Status: DC
  Administered 2023-08-13 – 2023-08-20 (×8): 1 g via ORAL
  Filled 2023-08-13 (×8): qty 1

## 2023-08-13 MED ORDER — ACETAMINOPHEN 650 MG RE SUPP: 650.0000 mg | Freq: Four times a day (QID) | RECTAL | Status: DC | PRN

## 2023-08-13 MED ORDER — VANCOMYCIN HCL 1250 MG/250ML IV SOLN
1250.0000 mg | Freq: Two times a day (BID) | INTRAVENOUS | Status: DC
  Administered 2023-08-13 – 2023-08-15 (×4): 1250 mg via INTRAVENOUS
  Filled 2023-08-13 (×5): qty 250

## 2023-08-13 MED ORDER — TERAZOSIN HCL 5 MG PO CAPS
5.0000 mg | ORAL_CAPSULE | Freq: Every day | ORAL | Status: DC
  Administered 2023-08-13 – 2023-08-19 (×8): 5 mg via ORAL
  Filled 2023-08-13 (×8): qty 1

## 2023-08-13 MED ORDER — ATORVASTATIN CALCIUM 20 MG PO TABS
20.0000 mg | ORAL_TABLET | Freq: Every day | ORAL | Status: DC
  Administered 2023-08-13 – 2023-08-19 (×8): 20 mg via ORAL
  Filled 2023-08-13 (×8): qty 1

## 2023-08-13 MED ORDER — ADULT MULTIVITAMIN W/MINERALS CH
1.0000 | ORAL_TABLET | Freq: Every day | ORAL | Status: DC
  Administered 2023-08-13 – 2023-08-20 (×8): 1 via ORAL
  Filled 2023-08-13 (×8): qty 1

## 2023-08-13 MED ORDER — LEVOTHYROXINE SODIUM 100 MCG PO TABS
100.0000 ug | ORAL_TABLET | Freq: Every day | ORAL | Status: DC
  Administered 2023-08-13 – 2023-08-20 (×8): 100 ug via ORAL
  Filled 2023-08-13 (×8): qty 1

## 2023-08-13 MED ORDER — VITAMIN D 25 MCG (1000 UNIT) PO TABS
2000.0000 [IU] | ORAL_TABLET | Freq: Every day | ORAL | Status: DC
  Administered 2023-08-13 – 2023-08-20 (×8): 2000 [IU] via ORAL
  Filled 2023-08-13 (×8): qty 2

## 2023-08-13 MED ORDER — INSULIN ASPART 100 UNIT/ML IJ SOLN
0.0000 [IU] | Freq: Every day | INTRAMUSCULAR | Status: DC
  Filled 2023-08-13: qty 1

## 2023-08-13 MED ORDER — RIFAXIMIN 550 MG PO TABS
550.0000 mg | ORAL_TABLET | Freq: Two times a day (BID) | ORAL | Status: DC
  Administered 2023-08-13 – 2023-08-20 (×16): 550 mg via ORAL
  Filled 2023-08-13 (×17): qty 1

## 2023-08-13 MED ORDER — ACETAMINOPHEN 325 MG PO TABS
650.0000 mg | ORAL_TABLET | Freq: Four times a day (QID) | ORAL | Status: DC | PRN
  Administered 2023-08-14 – 2023-08-20 (×6): 650 mg via ORAL
  Filled 2023-08-13 (×8): qty 2

## 2023-08-13 MED ORDER — AMPHETAMINE-DEXTROAMPHETAMINE 10 MG PO TABS
10.0000 mg | ORAL_TABLET | Freq: Every day | ORAL | Status: DC
  Administered 2023-08-13 – 2023-08-18 (×6): 10 mg via ORAL
  Filled 2023-08-13 (×6): qty 1

## 2023-08-13 MED ORDER — KETOTIFEN FUMARATE 0.035 % OP SOLN
1.0000 [drp] | Freq: Two times a day (BID) | OPHTHALMIC | Status: DC
  Administered 2023-08-13 – 2023-08-20 (×15): 1 [drp] via OPHTHALMIC
  Filled 2023-08-13 (×2): qty 5

## 2023-08-13 MED ORDER — INSULIN GLARGINE-YFGN 100 UNIT/ML ~~LOC~~ SOLN
10.0000 [IU] | Freq: Every day | SUBCUTANEOUS | Status: DC
  Administered 2023-08-13 – 2023-08-19 (×7): 10 [IU] via SUBCUTANEOUS
  Filled 2023-08-13 (×8): qty 0.1

## 2023-08-13 MED ORDER — METHYLPHENIDATE HCL ER (OSM) 18 MG PO TBCR: 18.0000 mg | EXTENDED_RELEASE_TABLET | Freq: Every day | ORAL | Status: DC

## 2023-08-13 MED ORDER — FAMOTIDINE 20 MG PO TABS
20.0000 mg | ORAL_TABLET | Freq: Every day | ORAL | Status: DC
  Administered 2023-08-13 – 2023-08-20 (×8): 20 mg via ORAL
  Filled 2023-08-13 (×8): qty 1

## 2023-08-13 MED ORDER — ONDANSETRON HCL 4 MG/2ML IJ SOLN: 4.0000 mg | Freq: Three times a day (TID) | INTRAMUSCULAR | Status: DC | PRN

## 2023-08-13 MED ORDER — PANTOPRAZOLE SODIUM 40 MG PO TBEC
40.0000 mg | DELAYED_RELEASE_TABLET | Freq: Two times a day (BID) | ORAL | Status: DC
  Administered 2023-08-13 – 2023-08-20 (×16): 40 mg via ORAL
  Filled 2023-08-13 (×16): qty 1

## 2023-08-13 MED ORDER — LEVETIRACETAM 250 MG PO TABS
250.0000 mg | ORAL_TABLET | Freq: Two times a day (BID) | ORAL | Status: DC
  Administered 2023-08-13: 250 mg via ORAL
  Filled 2023-08-13 (×2): qty 1

## 2023-08-13 MED ORDER — BUTALBITAL-APAP-CAFFEINE 50-325-40 MG PO TABS: 2.0000 | ORAL_TABLET | Freq: Three times a day (TID) | ORAL | Status: DC | PRN

## 2023-08-13 MED ORDER — VANCOMYCIN HCL 2000 MG/400ML IV SOLN
2000.0000 mg | Freq: Once | INTRAVENOUS | Status: AC
  Administered 2023-08-13: 2000 mg via INTRAVENOUS
  Filled 2023-08-13: qty 400

## 2023-08-13 MED ORDER — HYDRALAZINE HCL 20 MG/ML IJ SOLN: 5.0000 mg | INTRAMUSCULAR | Status: DC | PRN

## 2023-08-13 MED ORDER — LACTULOSE 10 GM/15ML PO SOLN
20.0000 g | Freq: Every day | ORAL | Status: DC
  Administered 2023-08-13 – 2023-08-15 (×4): 20 g via ORAL
  Filled 2023-08-13 (×5): qty 30

## 2023-08-13 MED ORDER — RISAQUAD PO CAPS
1.0000 | ORAL_CAPSULE | Freq: Every day | ORAL | Status: DC
  Administered 2023-08-13 – 2023-08-20 (×8): 1 via ORAL
  Filled 2023-08-13 (×8): qty 1

## 2023-08-13 MED ORDER — LACTULOSE 10 GM/15ML PO SOLN
30.0000 g | Freq: Two times a day (BID) | ORAL | Status: DC
  Administered 2023-08-13 – 2023-08-16 (×8): 30 g via ORAL
  Filled 2023-08-13 (×7): qty 60

## 2023-08-13 NOTE — Hospital Course (Addendum)
77 y.o. male with medical history significant of NASH, liver cirrhosis, hepatic encephalopathy, esophageal varices, GI bleeding, anemia, thrombocytopenia, HTN, HLD, DM, dCHF, hypothyroidism, OSA not on CPAP, melanoma, s/p of TAVR, recent admission due to subdural hematoma, MRSE bacteremia, who presents with altered mental status.   Patient was recently hospitalized from 7/29 - 8/16 due to subdural hematoma with uncal hernia.  Patient was s/p of s/p bilateral frontal burr hole washout procedure on 7/29, and then transferred to [facility] where pt had MMA embolization with NIR 8/19, then d/c'd to rehab facility on Friday (8/23). Per discharge summary, pt also had MRSE bacteremia. "Since pt had a subdural drain that had drained spinal fluid, there is concern for noscomial meningitis. Since has high risk for doing LP, decision was made to start tx for meningitis empirically. 8/15 as per ID patient may need vancomycin for 2 to 4 weeks".  Per his daughter, for reason vancomycin was not continued after patient was transferred to [facility].   Per his daughter, patient was alert and orientated x 3 when patient was transferred to rehab facility on 8/23, but becomes confused in the past several days.  When I saw patient in ED, patient is obviously confused and lethargic.  He is arousable and still orientated x 3 when aroused.  He moves all extremities.  No facial droop or slurred speech.  Patient does not have chest pain, cough or SOB.  No nausea, vomiting or abdominal pain.  Patient has loose stool which his daughter attributes to lactulose use.  No symptoms of UTI.  8/28.  Neurosurgery did not want to do any invasive procedure at this time.  Patient started on IV vancomycin for previous Staph epidermidis in blood cultures.  Patient's family interested in starting a stimulant so he can be up and awake and eat. 8/29.  Patient more alert today.  Very stiff.  As per patient's wife ate better last night. 8/30.  Patient more alert  today than yesterday.  Ate a better breakfast this morning. 8/31.  Patient holding more of a conversation today than yesterday.  Eating more. 9/1.  Patient able to straight leg raise on his own today.   9/2.  Patient asking more questions today. 9/3.  Family concerned that his mental status was not as good today as previously.  Repeat laboratory data showed a sodium of 132 creatinine 0.82, ammonia 34, white blood count 2.7, hemoglobin 10.6 and platelet count of 56.  CT scan of the head reviewed with neurosurgery and recommended conservative management.  He did not think this was a new blood.  Patient this morning having trouble completing full thoughts.  This afternoon doing better with that.  Restarted Keppra.

## 2023-08-13 NOTE — Progress Notes (Signed)
Initial Nutrition Assessment  DOCUMENTATION CODES:   Non-severe (moderate) malnutrition in context of chronic illness  INTERVENTION:   -Ensure Enlive po TID, each supplement provides 350 kcal and 20 grams of protein -Magic cup TID with meals, each supplement provides 290 kcal and 9 grams of protein  -MVI with minerals daily  NUTRITION DIAGNOSIS:   Moderate Malnutrition related to chronic illness (cirrhosis) as evidenced by mild fat depletion, moderate fat depletion, mild muscle depletion, moderate muscle depletion, percent weight loss.  GOAL:   Patient will meet greater than or equal to 90% of their needs  MONITOR:   PO intake, Supplement acceptance, Diet advancement  REASON FOR ASSESSMENT:   Malnutrition Screening Tool    ASSESSMENT:   Pt with medical history significant of NASH, liver cirrhosis, hepatic encephalopathy, esophageal varices, GI bleeding, anemia, thrombocytopenia, HTN, HLD, DM, dCHF, hypothyroidism, OSA not on CPAP, melanoma, s/p of TAVR, recent admission due to subdural hematoma, MRSE bacteremia, who presents with altered mental status.  Pt admitted with hepatic encephalopathy and SDH.   Reviewed I/O's: +400 ml x 24 hours  UOP: 700 ml x 24 hours   Per neurosurgery notes, continuing to speak with family regarding plan.   Pt lying in bed at time of visit. He did not respond to voice or touch. No family at bedside.   Pt currently on a dysphagia 2 diet. Noted meal completions 75%. Observed lunch tray; pt consumed a few bites of chicken.   Reviewed wt hx; pt has experienced a 8.7% wt loss over the past 4 months, which is significant for time frame.   Medications reviewed and include vitamin D3, lactulose, and lovasa.  Lab Results  Component Value Date   HGBA1C 5.4 06/17/2023   PTA DM medications are 0.5 mg semaglutide weekly, 18 units insulin glargine daily, and 10 units insulin aspart TID.   Labs reviewed: CBGS: 119-186 (inpatient orders for glycemic  control are 0-5 units insulin aspart daily at bedtime, 0-9 units insulin aspart TID, and 10 units insulin glargine-yfgn daily).    NUTRITION - FOCUSED PHYSICAL EXAM:  Flowsheet Row Most Recent Value  Orbital Region Mild depletion  Upper Arm Region Moderate depletion  Thoracic and Lumbar Region No depletion  Buccal Region Mild depletion  Temple Region Moderate depletion  Clavicle Bone Region Mild depletion  Clavicle and Acromion Bone Region Mild depletion  Scapular Bone Region Mild depletion  Dorsal Hand Mild depletion  Patellar Region No depletion  Anterior Thigh Region No depletion  Posterior Calf Region No depletion  Edema (RD Assessment) None  Hair Reviewed  Eyes Reviewed  Mouth Reviewed  Skin Reviewed  Nails Reviewed       Diet Order:   Diet Order             DIET DYS 2 Room service appropriate? Yes with Assist; Fluid consistency: Thin  Diet effective now                   EDUCATION NEEDS:   No education needs have been identified at this time  Skin:  Skin Integrity Issues:: Stage I Stage I: lt buttocks Incisions: -  Last BM:  08/12/23  Height:   Ht Readings from Last 1 Encounters:  08/12/23 6\' 2"  (1.88 m)    Weight:   Wt Readings from Last 1 Encounters:  08/13/23 95.3 kg    Ideal Body Weight:  86.4 kg  BMI:  Body mass index is 26.98 kg/m.  Estimated Nutritional Needs:   Kcal:  2400-2600  Protein:  115-130 grams  Fluid:  > 2 L    Levada Schilling, RD, LDN, CDCES Registered Dietitian II Certified Diabetes Care and Education Specialist Please refer to [facility] for RD and/or RD on-call/weekend/after hours pager

## 2023-08-13 NOTE — Assessment & Plan Note (Addendum)
Continue Xifaxan and lactulose.

## 2023-08-13 NOTE — Plan of Care (Signed)

## 2023-08-13 NOTE — Assessment & Plan Note (Addendum)
Could be secondary to partially treated Staph epidermidis infection.  Vancomycin restarted.  Case discussed with infectious disease specialist if blood cultures negative for 5 days then can discontinue the vancomycin after that.  Patient seems more alert with a trial of stimulant Adderall.  Ammonia level not that high I do not think this is hepatic encephalopathy.  CT scan showed improvement of subdural hematoma.  Keppra discontinued (spoke with neurology about this).

## 2023-08-13 NOTE — Assessment & Plan Note (Addendum)
Platelet count 61, hemoglobin 10.7 and white cell count 2.7

## 2023-08-13 NOTE — Plan of Care (Signed)
  Problem: Skin Integrity: Goal: Risk for impaired skin integrity will decrease Outcome: Progressing   Problem: Clinical Measurements: Goal: Ability to maintain clinical measurements within normal limits will improve Outcome: Progressing Goal: Will remain free from infection Outcome: Progressing Goal: Diagnostic test results will improve Outcome: Progressing Goal: Respiratory complications will improve Outcome: Progressing Goal: Cardiovascular complication will be avoided Outcome: Progressing   Problem: Elimination: Goal: Will not experience complications related to bowel motility Outcome: Progressing Goal: Will not experience complications related to urinary retention Outcome: Progressing   Problem: Pain Managment: Goal: General experience of comfort will improve Outcome: Progressing   Problem: Safety: Goal: Ability to remain free from injury will improve Outcome: Progressing

## 2023-08-13 NOTE — Assessment & Plan Note (Signed)
Continue supplements

## 2023-08-13 NOTE — Assessment & Plan Note (Signed)
Also on buttock.  See full description below, present on admission.

## 2023-08-13 NOTE — Assessment & Plan Note (Signed)
On Protonix 

## 2023-08-13 NOTE — Evaluation (Addendum)
Clinical/Bedside Swallow Evaluation Patient Details  Name: Joshua Berry MRN: [id number] Date of Birth: 1946/05/02  Today's Date: 08/13/2023 Time: SLP Start Time (ACUTE ONLY): 1200 SLP Stop Time (ACUTE ONLY): 1300 SLP Time Calculation (min) (ACUTE ONLY): 60 min  Past Medical History:  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   Past Surgical History:  Past Surgical History:  Procedure Laterality Date   BURR HOLE Bilateral 07/14/2023   Procedure: BURR HOLES;  Surgeon: Venetia Night, MD;  Location: [facility] ORS;  Service: Neurosurgery;  Laterality: Bilateral;   CHOLECYSTECTOMY     COLONOSCOPY N/A 07/23/2019   Procedure: COLONOSCOPY;  Surgeon: Toney Reil, MD;  Location: [facility] ENDOSCOPY;  Service: Gastroenterology;  Laterality: N/A;   ESOPHAGOGASTRODUODENOSCOPY N/A 07/20/2019   Procedure: ESOPHAGOGASTRODUODENOSCOPY (EGD);  Surgeon: Toney Reil, MD;  Location: [facility] ENDOSCOPY;  Service: Gastroenterology;  Laterality: N/A;   ESOPHAGOGASTRODUODENOSCOPY Left 08/20/2019   Procedure: ESOPHAGOGASTRODUODENOSCOPY (EGD);  Surgeon: Pasty Spillers, MD;  Location: [facility] ENDOSCOPY;  Service: Endoscopy;  Laterality: Left;   ESOPHAGOGASTRODUODENOSCOPY (EGD) WITH PROPOFOL N/A 04/25/2021   Procedure: ESOPHAGOGASTRODUODENOSCOPY (EGD) WITH PROPOFOL;  Surgeon: Toney Reil, MD;  Location: [facility] ENDOSCOPY;  Service: Gastroenterology;  Laterality: N/A;   MECHANICAL AORTIC VALVE REPLACEMENT     SKIN BIOPSY     TEE WITHOUT CARDIOVERSION N/A 12/23/2018   Procedure: TRANSESOPHAGEAL ECHOCARDIOGRAM (TEE);  Surgeon: Dalia Heading, MD;   Location: [facility] ORS;  Service: Cardiovascular;  Laterality: N/A;   TEE WITHOUT CARDIOVERSION N/A 05/21/2022   Procedure: TRANSESOPHAGEAL ECHOCARDIOGRAM (TEE);  Surgeon: Antonieta Iba, MD;  Location: [facility] ORS;  Service: Cardiovascular;  Laterality: N/A;   TEE WITHOUT CARDIOVERSION N/A 04/09/2023   Procedure: TRANSESOPHAGEAL ECHOCARDIOGRAM;  Surgeon: Debbe Odea, MD;  Location: [facility] ORS;  Service: Cardiovascular;  Laterality: N/A;   HPI:  Pt is a 77 y.o. male with medical history significant of NASH, liver cirrhosis, hepatic encephalopathy, esophageal varices, GI bleeding, anemia, thrombocytopenia, HTN, HLD, DM, dCHF, hypothyroidism, OSA not on CPAP, melanoma, s/p of TAVR, recent admission due to subdural hematoma, MRSE bacteremia, who presents with altered mental status.     Patient was recently hospitalized from 7/29 - 8/16 due to subdural hematoma with uncal hernia.  Patient was s/p of s/p bilateral frontal burr hole washout procedure on 7/29, and then transferred to [facility] where pt had MMA embolization with NIR 8/19, then d/c'd to rehab facility on Friday (8/23). Per discharge summary, pt also had MRSE bacteremia. "Since pt had a subdural drain that had drained spinal fluid, there is concern for noscomial meningitis. Since has high risk for doing LP, decision was made to start tx for meningitis empirically. 8/15 as per ID patient may need vancomycin for 2 to 4 weeks".  Per his daughter, for reason vancomycin was not continued after patient was transferred to [facility].     Per his daughter, patient was alert and orientated x 3 when patient was transferred to rehab facility on 8/23, but becomes confused in the past several days.  When I saw patient in ED,  patient is obviously confused and lethargic.  He is arousable and still orientated x 3 when aroused.  He moves all extremities.  No facial droop or slurred speech.  Pt had been at [facility] for Rehab post last admit.   CT Head Imaging: Bilateral low to  intermediate density subdural collections persist,  maximal thickness on the right 11 mm compared with 13 mm 2 weeks  ago. Maximal thickness on the left 6 mm, compared with 7 mm 2 weeks  ago. Resolution of previously seen subdural air. Mass effect  persists with right to left shift of 6 mm, unchanged. No focal abnormality affects the brainstem or cerebellum. No worsening  finding.    Assessment / Plan / Recommendation  Clinical Impression   Pt seen today for BSE at this admit; recently seen by ST services during last(recent) admit. Pt alert and cooperative. Slow to respond at times. Family present. Pt seen for last admit w/ Dys. 2 w/ thins recommended d/t oral phase and mental status presentations then. Pt requires feeding support d/t current mental status, weakness, and decreased awareness.   OF NOTE: Pt exhibited mental status decline and often kept his eyes closed during the session/po intake unless cued to open them. Family stated they had been seeing the same which impacted his ability to "eat and drink enough" at Rehab and during this admit so far. On RA, afebrile. WBC 4.4.   Pt appears to present w/ grossly functional oropharyngeal phase swallowing w/ the minced foods, thin liquids diet w/ No pharyngeal phase dysphagia noted, No neuromuscular deficits noted. Oral phase appeared min slower for mastication of increased textured boluses -- similar to previous evaluations. Pt consumed po trials w/ No immediate, overt, clinical s/s of aspiration during po trials.  Pt appears at reduced risk for aspiration following general aspiration precautions using a modified diet consistency.  However, pt does have challenging factors that could impact oropharyngeal swallowing to include Acute/lengthy and Chronic illness, fatigue/weakness/deconditioning, and need for support w/ feeding at meals. These factors can increase risk for aspiration, dysphagia as well as decreased oral intake overall.   Pt appears to  present w/ more of a mental status impact on his alertness and desire for oral intake; not an oropharyngeal phase swallowing deficit.   During po trials, pt consumed all consistencies w/ no overt coughing, decline in vocal quality, or change in respiratory presentation during/post trials. Oral phase appeared grossly [facility] w/ timely bolus management and control of bolus propulsion for A-P transfer for swallowing. Min prolonged mastication time/effort noted w/ soft solids; suspect impact of increased texture. Oral clearing achieved w/ all trial consistencies -- moistened foods given.  OM Exam appeared [facility] w/ no unilateral weakness noted. Speech Clear; soft voice when he spoke. Pt helped to feed self by holding Cup to drink which improves safety of swallowing.    Recommend continue a more MINCED consistency diet w/ well-moistened foods; Thin liquids -- carefully monitor Cup drinking and pt should help to Hold Cup when drinking; No Straws if coughing noted w/ them. Recommend general aspiration precautions, tray setup and support sitting fully Upright w/ all oral intake. Reduce distractions/talking during meals. Feeding support as needed. Pills WHOLE vs CRUSHED in Puree for safer, easier swallowing-- recommend for now and for D/C to the Family. Reflux precautions. Education given on Pills in Puree; food consistencies and easy to eat options; general aspiration precautions to pt and Family.   No further skilled ST services indicated at this time as pt  appears to present at a Baseline similar to last admit -- f/u at Rehab recommended when pt's medical and mental status alertness improve for consideration of diet upgrade. Family agreed. NSG and MD updated, agreed. Family/pt agreed w/ above.  Recommend Dietician and Palliative Care f/u for support. SLP Visit Diagnosis: Dysphagia, oral phase (R13.11) (min;  missing Most Dentition; decreased awareness/alertness often- see Neurology/MD notes)    Aspiration Risk  Mild  aspiration risk;Risk for inadequate nutrition/hydration (reduced following general aspiration precautions; modified diet)    Diet Recommendation   Thin;Dysphagia 2 (chopped) (baseline) = a more MINCED consistency diet w/ well-moistened foods; Thin liquids -- carefully monitor Cup drinking and pt should help to Hold Cup when drinking; No Straws if coughing noted w/ them. Recommend general aspiration precautions, tray setup and support sitting fully Upright w/ all oral intake. Reduce distractions/talking during meals. Feeding support as needed. Reflux precautions.   Medication Administration: Whole meds with puree (vs CRUSHED in Puree)    Other  Recommendations Recommended Consults:  (Dietician f/u for support; Palliative Care for GOC, support) Oral Care Recommendations: Oral care BID;Oral care before and after PO;Staff/trained caregiver to provide oral care Caregiver Recommendations:  (n/a)    Recommendations for follow up therapy are one component of a multi-disciplinary discharge planning process, led by the attending physician.  Recommendations may be updated based on patient status, additional functional criteria and insurance authorization.  Follow up Recommendations Follow physician's recommendations for discharge plan and follow up therapies (at next venue of care)      Assistance Recommended at Discharge  FULL  Functional Status Assessment Patient has had a recent decline in their functional status and/or demonstrates limited ability to make significant improvements in function in a reasonable and predictable amount of time  Frequency and Duration  (n/a)   (n/a)       Prognosis Prognosis for improved oropharyngeal function: Fair Barriers to Reach Goals: Cognitive deficits;Language deficits;Time post onset;Severity of deficits;Behavior (eyes closed often) Barriers/Prognosis Comment: SDH requiring burr holes, tx      Swallow Study   General Date of Onset: 08/12/23 HPI: Pt is a 77  y.o. male with medical history significant of NASH, liver cirrhosis, hepatic encephalopathy, esophageal varices, GI bleeding, anemia, thrombocytopenia, HTN, HLD, DM, dCHF, hypothyroidism, OSA not on CPAP, melanoma, s/p of TAVR, recent admission due to subdural hematoma, MRSE bacteremia, who presents with altered mental status.     Patient was recently hospitalized from 7/29 - 8/16 due to subdural hematoma with uncal hernia.  Patient was s/p of s/p bilateral frontal burr hole washout procedure on 7/29, and then transferred to [facility] where pt had MMA embolization with NIR 8/19, then d/c'd to rehab facility on Friday (8/23). Per discharge summary, pt also had MRSE bacteremia. "Since pt had a subdural drain that had drained spinal fluid, there is concern for noscomial meningitis. Since has high risk for doing LP, decision was made to start tx for meningitis empirically. 8/15 as per ID patient may need vancomycin for 2 to 4 weeks".  Per his daughter, for reason vancomycin was not continued after patient was transferred to [facility].     Per his daughter, patient was alert and orientated x 3 when patient was transferred to rehab facility on 8/23, but becomes confused in the past several days.  When I saw patient in ED, patient is obviously confused and lethargic.  He is arousable and still orientated x 3 when aroused.  He moves all extremities.  No facial droop or  slurred speech.  Pt had been at [facility] for Rehab post last admit.   CT Head Imaging: Bilateral low to intermediate density subdural collections persist,  maximal thickness on the right 11 mm compared with 13 mm 2 weeks  ago. Maximal thickness on the left 6 mm, compared with 7 mm 2 weeks  ago. Resolution of previously seen subdural air. Mass effect  persists with right to left shift of 6 mm, unchanged. No focal abnormality affects the brainstem or cerebellum. No worsening  finding. Type of Study: Bedside Swallow Evaluation Previous Swallow Assessment: 07/17/2023 and  07/20/2023- dys. 2 w/ thins Diet Prior to this Study: Dysphagia 2 (finely chopped);Thin liquids (Level 0) Temperature Spikes Noted: No (wbc 4.8) Respiratory Status: Room air History of Recent Intubation: No Behavior/Cognition: Alert;Cooperative;Pleasant mood;Confused;Distractible;Requires cueing (decreased mental status - eyes closed when not cued to open them.) Oral Cavity Assessment: Within Functional Limits Oral Care Completed by SLP: Yes Oral Cavity - Dentition: Missing dentition (MOST) Vision:  (closed eyes often) Self-Feeding Abilities: Needs assist;Needs set up;Total assist (can hold cup to drink) Patient Positioning: Upright in bed (full assistance) Baseline Vocal Quality: Normal;Low vocal intensity Volitional Cough: Cognitively unable to elicit Volitional Swallow: Unable to elicit    Oral/Motor/Sensory Function Overall Oral Motor/Sensory Function: Within functional limits (no unilateral weakness noted)   Ice Chips Ice chips: Within functional limits Presentation: Spoon (fed; 1 trial)   Thin Liquid Thin Liquid: Within functional limits Presentation: Cup;Self Fed;Straw (~5+ ozs total) Other Comments: water, juice    Nectar Thick Nectar Thick Liquid: Not tested   Honey Thick Honey Thick Liquid: Not tested   Puree Puree: Within functional limits Presentation: Spoon (fed; 8 trials)   Solid     Solid: Impaired (minced/moistened) Presentation: Spoon (fed; 7 trials) Oral Phase Impairments: Impaired mastication (time) Oral Phase Functional Implications: Prolonged oral transit;Impaired mastication (min; missing dentition too) Pharyngeal Phase Impairments:  (none)        Jerilynn Som, MS, CCC-SLP Speech Language Pathologist Rehab Services; [facility] [place] [phone] (ascom) Kynisha Memon 08/13/2023,3:34 PM

## 2023-08-13 NOTE — Assessment & Plan Note (Signed)
On levothyroxine, last TSH normal range.

## 2023-08-13 NOTE — Assessment & Plan Note (Signed)
On Semglee insulin and sliding scale

## 2023-08-13 NOTE — Progress Notes (Signed)
Progress Note   [name] ZOX:[id number] DOB: July 17, 1946 DOA: 08/12/2023     1 DOS: the patient was seen and examined on 08/13/2023   Brief hospital course: 77 y.o. male with medical history significant of NASH, liver cirrhosis, hepatic encephalopathy, esophageal varices, GI bleeding, anemia, thrombocytopenia, HTN, HLD, DM, dCHF, hypothyroidism, OSA not on CPAP, melanoma, s/p of TAVR, recent admission due to subdural hematoma, MRSE bacteremia, who presents with altered mental status.   Patient was recently hospitalized from 7/29 - 8/16 due to subdural hematoma with uncal hernia.  Patient was s/p of s/p bilateral frontal burr hole washout procedure on 7/29, and then transferred to [facility] where pt had MMA embolization with NIR 8/19, then d/c'd to rehab facility on Friday (8/23). Per discharge summary, pt also had MRSE bacteremia. "Since pt had a subdural drain that had drained spinal fluid, there is concern for noscomial meningitis. Since has high risk for doing LP, decision was made to start tx for meningitis empirically. 8/15 as per ID patient may need vancomycin for 2 to 4 weeks".  Per his daughter, for reason vancomycin was not continued after patient was transferred to [facility].   Per his daughter, patient was alert and orientated x 3 when patient was transferred to rehab facility on 8/23, but becomes confused in the past several days.  When I saw patient in ED, patient is obviously confused and lethargic.  He is arousable and still orientated x 3 when aroused.  He moves all extremities.  No facial droop or slurred speech.  Patient does not have chest pain, cough or SOB.  No nausea, vomiting or abdominal pain.  Patient has loose stool which his daughter attributes to lactulose use.  No symptoms of UTI.  8/28.  Neurosurgery did not want to do any invasive procedure at this time.  Patient started on IV vancomycin for previous Staph epidermidis in blood cultures.  Patient's family interested in  starting a stimulant so he can be up and awake and eat.  Assessment and Plan: * Acute metabolic encephalopathy Could be secondary to partially treated Staph epidermidis infection.  Vancomycin restarted.  Patient's family interested in trying a stimulant.  Ammonia level not that high I do not think this is hepatic encephalopathy.  CT scan showed improvement of subdural hematoma.  Will discontinue Keppra.  Staphylococcus epidermidis bacteremia Previous hospitalization ID recommended 2 to 4 weeks of IV vancomycin but this was not continued after discharge from [facility].  Empirically on vancomycin and repeat blood cultures so far negative.  Subdural hematoma (HCC) Bilateral subdural hematomas and uncal herniation.  Status post bilateral frontal burr hole and then had an embolization procedure.  Will discontinue Keppra since this could be contributing to the altered mental status.  Cirrhosis of liver without ascites (HCC) Continue Xifaxan.  Acute hepatic encephalopathy (HCC) Continue Xifaxan.  Pancytopenia (HCC) Platelet count 75, hemoglobin 11.9 and white cell count 4.8.  Chronic diastolic CHF (congestive heart failure) (HCC) No signs of heart failure.  Last EF 60%.  HLD (hyperlipidemia) On Lipitor  GERD without esophagitis On Protonix  Controlled IDDM-2 with hyperglycemia On Semglee insulin and sliding scale  Hypothyroidism On levothyroxine, last TSH normal range.  Decubitus ulcer of coccyx, stage I Also on buttock.  See full description below, present on admission.  Malnutrition of moderate degree Continue supplements        Subjective: Patient get his eyes closed most of the time I was in the room.  Did answer few questions and  tried to follow a few commands.  Family concerned that he is not eating very well.  They wanted to try a stimulant.  Physical Exam: Vitals:   08/13/23 0429 08/13/23 0500 08/13/23 0815 08/13/23 1138  BP: (!) 116/54  (!) 115/59 (!) 106/54  Pulse:  78   74  Resp: 19  14 16   Temp: 98.2 F (36.8 C)  98.8 F (37.1 C) 99.1 F (37.3 C)  TempSrc: Oral  Oral Oral  SpO2: 100%  94% 96%  Weight:  95.3 kg    Height:       Physical Exam HENT:     Head: Normocephalic.     Mouth/Throat:     Pharynx: No oropharyngeal exudate.  Eyes:     General: Lids are normal.     Conjunctiva/sclera: Conjunctivae normal.  Cardiovascular:     Rate and Rhythm: Normal rate and regular rhythm.     Heart sounds: Normal heart sounds, S1 normal and S2 normal.  Pulmonary:     Breath sounds: No decreased breath sounds, wheezing, rhonchi or rales.  Abdominal:     Palpations: Abdomen is soft.     Tenderness: There is no abdominal tenderness.  Musculoskeletal:     Right lower leg: No swelling.     Left lower leg: No swelling.  Skin:    General: Skin is warm.     Findings: No rash.  Neurological:     Mental Status: He is lethargic.     Comments: Barely able to straight leg raise bilaterally.     Data Reviewed: Creatinine 0.76, AST 67, ALT 64, total bilirubin 1.9, hemoglobin 11.9, white blood count 4.8, platelet count 75  Family Communication: Spoke with wife at the bedside and children on the phone  Disposition: Status is: Inpatient Remains inpatient appropriate because: Monitor patient's eating.  Restarted vancomycin for previous Staph epidermidis infection.  Planned Discharge Destination: Home    Time spent: 28 minutes  Author: Alford Highland, MD 08/13/2023 4:58 PM  For on call review [email or web address].

## 2023-08-13 NOTE — Progress Notes (Signed)
Pharmacy Antibiotic Note  Joshua Berry is a 76 y.o. male admitted on 08/12/2023 with bacteremia and MRSE .  Pharmacy has been consulted for Vancomycin dosing.  Plan: Vancomycin 2 gm IV X 1 ordered for 8/28 @ ~ 0200. Vancomycin 1250 mg IV Q12H ordered to start on 8/28 @ ~ 1400.  AUC = 491.3 Vanc trough = 13.9   Height: 6\' 2"  (188 cm) Weight: 90.8 kg (200 lb 2.8 oz) IBW/kg (Calculated) : 82.2  Temp (24hrs), Avg:98.8 F (37.1 C), Min:98.7 F (37.1 C), Max:98.9 F (37.2 C)  Recent Labs  Lab 08/12/23 1659  WBC 5.5  CREATININE 0.64    Estimated Creatinine Clearance: 88.5 mL/min (by C-G formula based on SCr of 0.64 mg/dL).    Allergies  Allergen Reactions   Camphor Swelling and Rash   Latex Rash   Lisinopril Cough    Other Reaction(s): Cough   Sulfamethoxazole Swelling   Chocolate     Family states he is allergic   Nsaids Other (See Comments)    NASH    Antimicrobials this admission:   >>    >>   Dose adjustments this admission:   Microbiology results:  BCx:   UCx:    Sputum:    MRSA PCR:   Thank you for allowing pharmacy to be a part of this patient's care.  Cheryn Lundquist D 08/13/2023 1:50 AM

## 2023-08-13 NOTE — Assessment & Plan Note (Signed)
No signs of heart failure.  Last EF 60%.

## 2023-08-13 NOTE — Consult Note (Addendum)
Consult requested by:  [name]   Consult requested for:  Subdural hematoma  Primary Physician:  [facility]  DOS: 07/14/2023 (B burr holes) 08/04/2023 (MMA embo at [facility])   History of Present Illness: 08/13/2023 Mr. Ooley returns to medical attention with altered mental status.  He became confused over the past several days.  He was confused and lethargic in the emergency department.  He was admitted due to decline in function.  He has undergone extensive workup.  I was consulted to comment on his subdural hematomas.  The patient denies headache, nausea, or vomiting.  He quickly opened his eyes to interact with me.   07/14/2023 Mr. Deontrez Slavens is here today with a chief complaint of altered mental status.  Per his wife, he has had terrible headaches for 4-5 days. They have been unremitting.  Last night he had some confusion, and had trouble getting back into bed.    He has known history of diabetes, hypertension, and cirrhosis.  He has thrombocytopenia.  He had low blood pressure on presentation and was given IV fluids.  Workup was initiated which showed evidence of subdural hematomas.  The patient is unable to give me a history.  I spoke with his brother and with his wife to obtain history.   The symptoms are causing a significant impact on the patient's life.   I have utilized the care everywhere function in epic to review the outside records available from external health systems.  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    Past Surgical  History: Past Surgical History:  Procedure Laterality Date   BURR HOLE Bilateral 07/14/2023   Procedure: BURR HOLES;  Surgeon: Venetia Night, MD;  Location: [facility] ORS;  Service: Neurosurgery;  Laterality: Bilateral;   CHOLECYSTECTOMY     COLONOSCOPY N/A 07/23/2019   Procedure: COLONOSCOPY;  Surgeon: Toney Reil, MD;  Location: [facility] ENDOSCOPY;  Service: Gastroenterology;  Laterality: N/A;   ESOPHAGOGASTRODUODENOSCOPY N/A 07/20/2019   Procedure: ESOPHAGOGASTRODUODENOSCOPY (EGD);  Surgeon: Toney Reil, MD;  Location: [facility] ENDOSCOPY;  Service: Gastroenterology;  Laterality: N/A;   ESOPHAGOGASTRODUODENOSCOPY Left 08/20/2019   Procedure: ESOPHAGOGASTRODUODENOSCOPY (EGD);  Surgeon: Pasty Spillers, MD;  Location: [facility] ENDOSCOPY;  Service: Endoscopy;  Laterality: Left;   ESOPHAGOGASTRODUODENOSCOPY (EGD) WITH PROPOFOL N/A 04/25/2021   Procedure: ESOPHAGOGASTRODUODENOSCOPY (EGD) WITH PROPOFOL;  Surgeon: Toney Reil, MD;  Location: [facility] ENDOSCOPY;  Service: Gastroenterology;  Laterality: N/A;   MECHANICAL AORTIC VALVE REPLACEMENT     SKIN BIOPSY     TEE WITHOUT CARDIOVERSION N/A 12/23/2018   Procedure: TRANSESOPHAGEAL ECHOCARDIOGRAM (TEE);  Surgeon: Dalia Heading, MD;  Location: [facility] ORS;  Service: Cardiovascular;  Laterality: N/A;   TEE WITHOUT CARDIOVERSION N/A 05/21/2022   Procedure: TRANSESOPHAGEAL ECHOCARDIOGRAM (TEE);  Surgeon: Antonieta Iba, MD;  Location: [facility] ORS;  Service: Cardiovascular;  Laterality: N/A;   TEE WITHOUT CARDIOVERSION N/A 04/09/2023   Procedure: TRANSESOPHAGEAL ECHOCARDIOGRAM;  Surgeon: Debbe Odea, MD;  Location: [facility] ORS;  Service:  Cardiovascular;  Laterality: N/A;    Allergies: Allergies as of 08/12/2023 - Review Complete 08/12/2023  Allergen Reaction Noted   Camphor Swelling and Rash 11/17/2002   Latex Rash 09/16/2012   Lisinopril Cough 09/09/2017   Sulfamethoxazole Swelling 09/16/2012   Chocolate  07/26/2023   Nsaids  Other (See Comments) 07/28/2019    Medications: Current Meds  Medication Sig   atorvastatin (LIPITOR) 20 MG tablet Take 20 mg by mouth at bedtime.   augmented betamethasone dipropionate (DIPROLENE-AF) 0.05 % cream Apply 1 application topically 2 (two) times daily.   bisacodyl (DULCOLAX) 5 MG EC tablet Take 2 tablets (10 mg total) by mouth 2 (two) times daily.   Cholecalciferol 50 MCG (2000 UT) TABS Take 1 tablet by mouth daily.   clotrimazole (LOTRIMIN) 1 % external solution Apply 1 Application topically 2 (two) times daily. Apply to toe nails   famotidine (PEPCID) 20 MG tablet Take 20 mg by mouth daily.   furosemide (LASIX) 20 MG tablet Take 1 tablet (20 mg total) by mouth daily. (Patient taking differently: Take 20 mg by mouth daily. Only Monday, Wednesday and Friday)   insulin aspart (NOVOLOG) 100 UNIT/ML injection Inject 10 Units into the skin 3 (three) times daily before meals.   insulin glargine (LANTUS) 100 UNIT/ML injection Inject 0.14 mLs (14 Units total) into the skin daily. (Patient taking differently: Inject 18 Units into the skin daily.)   ketotifen (ZADITOR) 0.025 % ophthalmic solution Place 1 drop into both eyes 2 (two) times daily.   Lactobacillus Rhamnosus, GG, (RA PROBIOTIC DIGESTIVE CARE) CAPS Take 1 capsule by mouth daily.   lactulose (CHRONULAC) 10 GM/15ML solution Take 45 mLs (30 g total) by mouth 3 (three) times daily. (Patient taking differently: Take 30-45 mLs by mouth at bedtime. 45 ml @0800 /1400. 30 ml QHS)   levETIRAcetam (KEPPRA) 250 MG tablet Take 1 tablet (250 mg total) by mouth 2 (two) times daily.   levothyroxine (SYNTHROID) 100 MCG tablet Take 100 mcg by mouth daily before breakfast.   lidocaine (LIDODERM) 5 % Place 2 patches onto the skin daily. Remove & Discard patch within 12 hours or as directed by MD   magnesium oxide (MAG-OX) 400 (240 Mg) MG tablet Take 1 tablet (400 mg total) by mouth 2 (two) times daily.   Multiple Vitamin (MULTIVITAMIN WITH MINERALS)  TABS tablet Take 1 tablet by mouth daily.   Omega-3 Fatty Acids (FISH OIL) 1000 MG CAPS Take 1 capsule by mouth 2 (two) times daily.   pantoprazole (PROTONIX) 40 MG tablet Take 1 tablet (40 mg total) by mouth 2 (two) times daily.   rifaximin (XIFAXAN) 550 MG TABS tablet Take 550 mg by mouth 2 (two) times daily. Takes with lactulose   terazosin (HYTRIN) 5 MG capsule Take 5 mg by mouth at bedtime.   zinc oxide 20 % ointment Apply 1 Application topically in the morning and at bedtime. APPLY SMALL AMOUNT TO AFFECTED AREA TWICE A DAY FOR BUTTOCK RASH FOR BUTTOCK RASH    Social History: Social History   Tobacco Use   Smoking status: Former    Current packs/day: 0.00    Average packs/day: 0.5 packs/day for 5.0 years (2.5 ttl pk-yrs)    Types: Cigarettes    Start date: 10/17/1963    Quit date: 10/16/1968    Years since quitting: 54.8   Smokeless tobacco: Never  Vaping Use   Vaping status: Never Used  Substance Use Topics   Alcohol use: Not Currently   Drug use: Not Currently  Family Medical History: Family History  Problem Relation Age of Onset   Heart attack Brother     Physical Examination: Vitals:   08/13/23 0104 08/13/23 0429  BP: 120/68 (!) 116/54  Pulse: 74 78  Resp: 17 19  Temp: 98.7 F (37.1 C) 98.2 F (36.8 C)  SpO2: 100% 100%    General: Patient is arousable and can maintain conversation  Neck:   Supple.    Respiratory: Patient is breathing without any difficulty.   NEUROLOGICAL:     Opens eyes to voice.  Regards.  Follows commands.  Is able to answer questions.  He is oriented to self, hospital, and August 2024. His speech is clear, but sparse.  Cranial Nerves: Pupils equal round and reactive to light.  Facial tone is symmetric.  Facial sensation is symmetric. Shoulder shrug is symmetric. Tongue protrusion is midline.  He has mild left-sided pronator drift  Strength:  He appears to have 5/5 strength in all 4 limbs.  Sensory examination is unreliable.   Reflexes are 1+.  Gait is untested.    Incisions are clean dry and intact bilaterally.  Medical Decision Making  Imaging: CT Head 07/14/2023 FINDINGS: Brain: There are acute subdural hematomas bilaterally with 1.2 cm leftward midline shift. The subdural hematoma along the right cerebral convexity measures up to 1.7 cm. The subdural hematoma along the left cerebral convexity measures up to 7 mm. There is effacement of the sulci bilaterally with likely bilateral uncal herniation. No hydrocephalus.   Vascular: No hyperdense vessel or unexpected calcification.   Skull: Normal. Negative for fracture or focal lesion.   Sinuses/Orbits: No acute finding.   Other: None.   IMPRESSION: Acute bilateral subdural hematomas with 1.2 cm leftward midline shift and likely early bilateral uncal herniation.   Findings were discussed with Dr. [name] on 07/14/23 at 1:05 PM.     Electronically Signed   By: Lorenza Cambridge M.D.   On: 07/14/2023 13:06   CT Head 08/12/2023 IMPRESSION: Bilateral low to intermediate density subdural collections persist, maximal thickness on the right 11 mm compared with 13 mm 2 weeks ago. Maximal thickness on the left 6 mm, compared with 7 mm 2 weeks ago. Resolution of previously seen subdural air. Mass effect persists with right to left shift of 6 mm, unchanged. No worsening finding.     Electronically Signed   By: Paulina Fusi M.D.   On: 08/12/2023 17:08  I have personally reviewed the images and agree with the above interpretation.  Labs reviewed.  Pertinent for thrombocytopenia and elevated INR.  Assessment and Plan: Mr. Facenda is a pleasant 77 y.o. 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  They are smaller than on prior studies, but he continues to have some midline shift from right to left due to brain compression.  There is no evidence of infection.  It is likely that his subdurals will continue to slowly decrease in size  over time now that he has had the MMA embolization.  However, it may take some time to optimize.  I would expect weeks before the full results of the MMA embolization are shown on CT scan.  I would like to talk with his family to develop a comprehensive plan and discuss whether repeat drainage at this point is worthwhile.  His blood parameters are improved compared to with his presentation last month.  As such, he may not need as much blood product support as he did prior to and after the prior  surgery.  Will finalize our plan moving forward.  I have communicated my recommendations to the requesting physician and coordinated care to facilitate these recommendations.     Sadia Belfiore K. Myer Haff MD, MPHS Neurosurgery   Addendum to above - spoke to patient, wife, son, daughter in law around 28 am.  - no surgical intervention - will discuss utility of CNS stimulant or appetite stimulant to see if that will improve mentation/PO intake - patient would not want permanent G tube

## 2023-08-13 NOTE — Assessment & Plan Note (Addendum)
Pancytopenia and elevated liver function test secondary to cirrhosis.  Continue Xifaxan.

## 2023-08-13 NOTE — Assessment & Plan Note (Addendum)
On Lipitor.  CK normal range

## 2023-08-13 NOTE — Assessment & Plan Note (Addendum)
Bilateral subdural hematomas and uncal herniation.  Status post bilateral frontal burr hole and then had an embolization procedure.  Restart Keppra.

## 2023-08-13 NOTE — Progress Notes (Signed)
Assumed care of this patient arriving from ED at 0100 via stretcher. Family member is by bedside. On RA, breathing is even and unlabored. Patient was alert to self and place. Skin assessment done, noted with ecchymotic areas to right arm and redness to sacrum area. He also has a healing abrasion to left leg. Denies pain at this time. Patient was oriented to room and call bell placed within reach.

## 2023-08-13 NOTE — Assessment & Plan Note (Addendum)
Previous hospitalization ID recommended 2 to 4 weeks of IV vancomycin but this was not continued after discharge from [facility].  Empirically on vancomycin and repeat blood cultures so far negative.  Will empirically have on vancomycin until blood cultures are negative for 5 days then will discontinue.

## 2023-08-14 ENCOUNTER — Inpatient Hospital Stay: Payer: No Typology Code available for payment source

## 2023-08-14 DIAGNOSIS — M25531 Pain in right wrist: Secondary | ICD-10-CM

## 2023-08-14 LAB — GLUCOSE, CAPILLARY
Glucose-Capillary: 121 mg/dL — ABNORMAL HIGH (ref 70–99)
Glucose-Capillary: 225 mg/dL — ABNORMAL HIGH (ref 70–99)
Glucose-Capillary: 194 mg/dL — ABNORMAL HIGH (ref 70–99)
Glucose-Capillary: 136 mg/dL — ABNORMAL HIGH (ref 70–99)

## 2023-08-14 LAB — URIC ACID: Uric Acid, Serum: 3.4 mg/dL — ABNORMAL LOW (ref 3.7–8.6)

## 2023-08-14 NOTE — Evaluation (Signed)
Physical Therapy Evaluation Patient Details Name: TONNY BOISSEAU MRN: [id number] DOB: [birth year] Today's Date: 08/14/2023  History of Present Illness  HERVE BITTING is a 77 y.o. male with medical history significant of NASH, liver cirrhosis, hepatic encephalopathy, esophageal varices, GI bleeding, anemia, thrombocytopenia, HTN, HLD, DM, dCHF, hypothyroidism, OSA not on CPAP, melanoma, s/p of TAVR, recent admission due to subdural hematoma, MRSE bacteremia, who presents with altered mental status. Recent admission 2 weeks ago and was transferred to [facility] and then SNF.   Clinical Impression  Patient received in bed, he is alert and able to respond to direction and questions. Unsure of accuracy of information provided, no family at bedside. Patient is extremely rigid/stiff and painful in all joints of B UEs and B LEs. Looks like from prior admission he required total assist for mobility 2 weeks ago. Not sure that he has been able to participate in therapy since that time. Patient is dependent with all mobility now. Will keep on caseload until we can determine if he was able to participate in therapy while at SNF. If this is his new baseline we will likely sign off.             If plan is discharge home, recommend the following: Two people to help with walking and/or transfers;Two people to help with bathing/dressing/bathroom   Can travel by private vehicle   No    Equipment Recommendations None recommended by PT  Recommendations for Other Services       Functional Status Assessment Patient has had a recent decline in their functional status and demonstrates the ability to make significant improvements in function in a reasonable and predictable amount of time.     Precautions / Restrictions Precautions Precautions: Fall Restrictions Weight Bearing Restrictions: No      Mobility  Bed Mobility Overal bed mobility: Needs Assistance Bed Mobility: Rolling Rolling: Total assist          General bed mobility comments: patient requies total assist to even separate legs, straighten elbows, etc. Does not appear that he has been mobilizing at all lately    Transfers                   General transfer comment: unable/unsafe to attempt    Ambulation/Gait               General Gait Details: unable  Stairs            Wheelchair Mobility     Tilt Bed    Modified Rankin (Stroke Patients Only)       Balance Overall balance assessment: Needs assistance                                           Pertinent Vitals/Pain Pain Assessment Pain Assessment: Faces Faces Pain Scale: Hurts even more Pain Location: grimaces with movement of all extremities, patient very stiff and rigid. Pain Descriptors / Indicators: Grimacing Pain Intervention(s): Monitored during session, Repositioned    Home Living Family/patient expects to be discharged to:: Skilled nursing facility                 Home Equipment: Rollator (4 wheels);Cane - single point;Shower Counsellor (2 wheels);Wheelchair - manual;Other (comment) Additional Comments: information taken from previous evaluation- pt is poor historian on this date    Prior Function Prior Level of Function : Patient  poor historian/Family not available             Mobility Comments: unsure, patient reports he has not been out of bed in 6 months. no family at bedside. I do know patient was just at SNF prior to admission. ADLs Comments: unsure     Extremity/Trunk Assessment   Upper Extremity Assessment Upper Extremity Assessment: LUE deficits/detail;RUE deficits/detail RUE: Shoulder pain with ROM RUE Coordination: decreased gross motor LUE Deficits / Details: rigidity and pain with passive rom in all joints of B UEs. LUE: Shoulder pain with ROM LUE Coordination: decreased gross motor    Lower Extremity Assessment Lower Extremity Assessment: RLE deficits/detail;LLE  deficits/detail RLE: Unable to fully assess due to pain RLE Coordination: decreased gross motor LLE Deficits / Details: significant rigidity/stiffness in B LEs. Limited by pain LLE: Unable to fully assess due to pain LLE Coordination: decreased gross motor       Communication   Communication Following commands: Follows one step commands with increased time Cueing Techniques: Verbal cues  Cognition Arousal: Alert Behavior During Therapy: Flat affect Overall Cognitive Status: Difficult to assess Area of Impairment: Problem solving, Awareness, Safety/judgement                 Orientation Level: Disoriented to, Time, Situation Current Attention Level: Focused   Following Commands: Follows one step commands with increased time       General Comments: patient alert and responds to questions. Unsure of accuracy of information provided by patient        General Comments      Exercises     Assessment/Plan    PT Assessment Patient needs continued PT services  PT Problem List Decreased strength;Decreased range of motion;Decreased mobility;Pain       PT Treatment Interventions Functional mobility training;Therapeutic activities;Therapeutic exercise;Patient/family education    PT Goals (Current goals can be found in the Care Plan section)  Acute Rehab PT Goals Patient Stated Goal: return to SNF PT Goal Formulation: With patient Time For Goal Achievement: 08/29/23 Potential to Achieve Goals: Fair    Frequency Min 1X/week     Co-evaluation               AM-PAC PT "6 Clicks" Mobility  Outcome Measure Help needed turning from your back to your side while in a flat bed without using bedrails?: Total Help needed moving from lying on your back to sitting on the side of a flat bed without using bedrails?: Total Help needed moving to and from a bed to a chair (including a wheelchair)?: Total Help needed standing up from a chair using your arms (e.g., wheelchair or  bedside chair)?: Total Help needed to walk in hospital room?: Total Help needed climbing 3-5 steps with a railing? : Total 6 Click Score: 6    End of Session   Activity Tolerance: Patient limited by pain;Other (comment) (weakness, rigidity) Patient left: in bed;with nursing/sitter in room;with call bell/phone within reach Nurse Communication: Mobility status PT Visit Diagnosis: Other abnormalities of gait and mobility (R26.89);Pain Pain - Right/Left:  (Bilateral) Pain - part of body:  (all joints/extremities)    Time: 1431-1440 PT Time Calculation (min) (ACUTE ONLY): 9 min   Charges:   PT Evaluation $PT Eval Moderate Complexity: 1 Mod   PT General Charges $$ ACUTE PT VISIT: 1 Visit         Kyonna Frier, PT, GCS 08/14/23,3:11 PM

## 2023-08-14 NOTE — TOC Progression Note (Signed)
Transition of Care [facility]) - Progression Note    Patient Details  Name: Joshua Berry MRN: [id number] Date of Birth: 26-Sep-1946  Transition of [facility]) CM/SW Contact  Chapman Fitch, RN Phone Number: 08/14/2023, 2:48 PM  Clinical Narrative:     Confirmed with Tiffany at [facility] that patient is STR. PT/OT eval pending   Expected Discharge Plan: Skilled Nursing Facility Barriers to Discharge: Barriers Resolved  Expected Discharge Plan and Services                                               Social Determinants of Health (SDOH) Interventions SDOH Screenings   Food Insecurity: No Food Insecurity (08/13/2023)  Housing: Low Risk  (08/13/2023)  Transportation Needs: No Transportation Needs (08/13/2023)  Recent Concern: Transportation Needs - Unmet Transportation Needs (06/17/2023)  Utilities: Not At Risk (08/13/2023)  Recent Concern: Utilities - At Risk (06/17/2023)  Depression (PHQ2-9): Low Risk  (04/24/2023)  Financial Resource Strain: Low Risk  (08/06/2023)   Received from [facility]  Social Connections: Unknown (04/29/2022)   [facility] from [facility]  Tobacco Use: Medium Risk (08/12/2023)    Readmission Risk Interventions    08/14/2023    2:18 PM 07/15/2023    8:52 AM 06/18/2023   10:19 AM  Readmission Risk Prevention Plan  Transportation Screening Complete Complete Complete  PCP or Specialist Appt within 5-7 Days   Complete  PCP or Specialist Appt within 3-5 Days Complete Complete   Home Care Screening   Complete  Medication Review (RN CM)   Complete  HRI or Home Care Consult Complete    Social Work Consult for Recovery Care Planning/Counseling Complete Complete   Palliative Care Screening Not Applicable Not Applicable   Medication Review Oceanographer) Complete Referral to Pharmacy

## 2023-08-14 NOTE — Progress Notes (Signed)
Progress Note   [name] GNF:[id number] DOB: [birth year] DOA: 08/12/2023     2 DOS: the patient was seen and examined on 08/14/2023   Brief hospital course: 77 y.o. male with medical history significant of NASH, liver cirrhosis, hepatic encephalopathy, esophageal varices, GI bleeding, anemia, thrombocytopenia, HTN, HLD, DM, dCHF, hypothyroidism, OSA not on CPAP, melanoma, s/p of TAVR, recent admission due to subdural hematoma, MRSE bacteremia, who presents with altered mental status.   Patient was recently hospitalized from 7/29 - 8/16 due to subdural hematoma with uncal hernia.  Patient was s/p of s/p bilateral frontal burr hole washout procedure on 7/29, and then transferred to [facility] where pt had MMA embolization with NIR 8/19, then d/c'd to rehab facility on Friday (8/23). Per discharge summary, pt also had MRSE bacteremia. "Since pt had a subdural drain that had drained spinal fluid, there is concern for noscomial meningitis. Since has high risk for doing LP, decision was made to start tx for meningitis empirically. 8/15 as per ID patient may need vancomycin for 2 to 4 weeks".  Per his daughter, for reason vancomycin was not continued after patient was transferred to [facility].   Per his daughter, patient was alert and orientated x 3 when patient was transferred to rehab facility on 8/23, but becomes confused in the past several days.  When I saw patient in ED, patient is obviously confused and lethargic.  He is arousable and still orientated x 3 when aroused.  He moves all extremities.  No facial droop or slurred speech.  Patient does not have chest pain, cough or SOB.  No nausea, vomiting or abdominal pain.  Patient has loose stool which his daughter attributes to lactulose use.  No symptoms of UTI.  8/28.  Neurosurgery did not want to do any invasive procedure at this time.  Patient started on IV vancomycin for previous Staph epidermidis in blood cultures.  Patient's family interested in  starting a stimulant so he can be up and awake and eat. 8/29.  Patient more alert today.  Very stiff.  As per patient's wife ate better last night.  Assessment and Plan: * Acute metabolic encephalopathy Could be secondary to partially treated Staph epidermidis infection.  Vancomycin restarted.  Will get ID consultation to see what to do with antibiotic.  Patient's family interested in trying a stimulant.  Ammonia level not that high I do not think this is hepatic encephalopathy.  CT scan showed improvement of subdural hematoma.  Keppra discontinued (spoke with neurology about this).  Staphylococcus epidermidis bacteremia Previous hospitalization ID recommended 2 to 4 weeks of IV vancomycin but this was not continued after discharge from [facility].  Empirically on vancomycin and repeat blood cultures so far negative.  Will consult ID about what to do with antibiotics.  Subdural hematoma (HCC) Bilateral subdural hematomas and uncal herniation.  Status post bilateral frontal burr hole and then had an embolization procedure.  Discontinued Keppra since this could be contributing to the altered mental status.  Cirrhosis of liver without ascites (HCC) Thrombocytopenia and elevated liver function test secondary to cirrhosis.  Continue Xifaxan.  Acute hepatic encephalopathy (HCC) Continue Xifaxan.  Pancytopenia (HCC) Platelet count 75, hemoglobin 11.9 and white cell count 4.8.  Chronic diastolic CHF (congestive heart failure) (HCC) No signs of heart failure.  Last EF 60%.  HLD (hyperlipidemia) On Lipitor.  Check CK tomorrow.  GERD without esophagitis On Protonix  Controlled IDDM-2 with hyperglycemia On Semglee insulin and sliding scale  Hypothyroidism On levothyroxine,  last TSH normal range.  Right wrist pain X-ray shows arthritis.  No fracture.  Patient is very stiff all over his body.  PT and OT consultation.  Decubitus ulcer of coccyx, stage I Also on buttock.  See full description  below, present on admission.  Malnutrition of moderate degree Continue supplements        Subjective: Patient wishes he felt better.  More talkative today.  His eyes are open today.  He had an Ensure in his hand but had it spilled on his count.  Physical Exam: Vitals:   08/13/23 1729 08/13/23 2209 08/14/23 0051 08/14/23 1045  BP: 120/69 123/64 (!) 106/58 127/67  Pulse: 73 71 76 77  Resp: 16     Temp: 99.3 F (37.4 C) 99.1 F (37.3 C) 99.2 F (37.3 C)   TempSrc: Oral Oral Oral   SpO2: 99% 97% 98% 100%  Weight:      Height:       Physical Exam HENT:     Head: Normocephalic.     Mouth/Throat:     Pharynx: No oropharyngeal exudate.  Eyes:     General: Lids are normal.     Conjunctiva/sclera: Conjunctivae normal.  Cardiovascular:     Rate and Rhythm: Normal rate and regular rhythm.     Heart sounds: Normal heart sounds, S1 normal and S2 normal.  Pulmonary:     Breath sounds: No decreased breath sounds, wheezing, rhonchi or rales.  Abdominal:     Palpations: Abdomen is soft.     Tenderness: There is no abdominal tenderness.  Musculoskeletal:     Right lower leg: No swelling.     Left lower leg: No swelling.  Skin:    General: Skin is warm.     Findings: No rash.  Neurological:     Mental Status: He is alert.     Comments: Patient very stiff with all of his joints legs and arms.     Data Reviewed: Uric acid 3.4.  Last platelet count 75  Family Communication: Spoke with wife on the phone.  Disposition: Status is: Inpatient Remains inpatient appropriate because: Will get PT and OT evaluations since mental status is a little bit better today.  Will get ID consultation about what to do with antibiotics.  Planned Discharge Destination: Rehab    Time spent: 28 minutes Case discussed with neurology.  Author: Alford Highland, MD 08/14/2023 1:38 PM  For on call review [email or web address].

## 2023-08-14 NOTE — TOC Initial Note (Signed)
Transition of Care [facility]) - Initial/Assessment Note    Patient Details  Name: Joshua Berry MRN: [id number] Date of Birth: [birth year]  Transition of Care [facility]) CM/SW Contact:    Truddie Hidden, RN Phone Number: 08/14/2023, 2:24 PM  Clinical Narrative:                 Spoke with patient's wife. Patient was sleeping.  Patient and his wife are residents at [facility]. Prior to admitting to [facility] his wife states he was at [facility]. She would like for him to return using any available [facility] benefits.   Expected Discharge Plan: Skilled Nursing Facility Barriers to Discharge: Barriers Resolved   Patient Goals and CMS Choice Patient states their goals for this hospitalization and ongoing recovery are:: Return to SNF          Expected Discharge Plan and Services                                              Prior Living Arrangements/Services              Need for Family Participation in Patient Care: Yes (Comment) Care giver support system in place?: Yes (comment)   Criminal Activity/Legal Involvement Pertinent to Current Situation/Hospitalization: No - Comment as needed  Activities of Daily Living Home Assistive Devices/Equipment: Cane (specify quad or straight) ADL Screening (condition at time of admission) Patient's cognitive ability adequate to safely complete daily activities?: No Is the patient deaf or have difficulty hearing?: Yes Does the patient have difficulty seeing, even when wearing glasses/contacts?: No Does the patient have difficulty concentrating, remembering, or making decisions?: Yes Patient able to express need for assistance with ADLs?: Yes Does the patient have difficulty dressing or bathing?: No Independently performs ADLs?: Yes (appropriate for developmental age) Does the patient have difficulty walking or climbing stairs?: Yes Weakness of Legs: Both Weakness of Arms/Hands: None  Permission Sought/Granted                   Emotional Assessment Appearance:: Other (Comment Required (Spouse completed)       Alcohol / Substance Use: Not Applicable Psych Involvement: No (comment)  Admission diagnosis:  Disorientation [R41.0] Bilateral subdural hematomas (HCC) [S06.5XAA] Acute metabolic encephalopathy [G93.41] Patient Active Problem List   Diagnosis Date Noted   Right wrist pain 08/14/2023   Bilateral subdural hematomas (HCC) 08/13/2023   Malnutrition of moderate degree 08/13/2023   Decubitus ulcer of coccyx, stage I 08/13/2023   Acute metabolic encephalopathy 08/12/2023   Staphylococcus epidermidis bacteremia 07/23/2023   Acute nontraumatic intracranial subdural hematoma (HCC) 07/15/2023   Subdural hematoma (HCC) 07/14/2023   Compression of brain (HCC) 07/14/2023   Uncal herniation (HCC) 07/14/2023   Type 2 diabetes mellitus without complications (HCC) 06/17/2023   GERD without esophagitis 06/17/2023   Dyslipidemia 06/17/2023   Pressure injury of skin 06/17/2023   Acute urinary retention 04/11/2023   Streptococcal bacteremia 04/07/2023   Altered mental status 04/06/2023   Hepatic encephalopathy (HCC) 04/05/2023   Overweight (BMI 25.0-29.9) 08/03/2022   Hyponatremia, hypokalemia and hypomagnesemia 05/18/2022   Hyponatremia 05/18/2022   Hypokalemia 05/18/2022   Back pain 05/17/2022   Hypomagnesemia 05/17/2022   Acute hepatic encephalopathy (HCC) 05/17/2022   AKI (acute kidney injury) (HCC) 05/16/2022   Elevated liver enzymes 05/16/2022   Hypotension 05/16/2022   Right shoulder pain 05/16/2022   Hypothyroidism  05/16/2022   Physical deconditioning 05/16/2022   Obesity (BMI 30-39.9) 05/16/2022   Lactic acidosis 05/16/2022   Fall at home, initial encounter 05/16/2022   Septic shock due to Staphylococcus bacteremia 05/14/2022   Chest pain 04/06/2022   Controlled IDDM-2 with hyperglycemia 04/06/2022   HLD (hyperlipidemia) 04/06/2022   HTN (hypertension) 04/06/2022   Chronic diastolic CHF  (congestive heart failure) (HCC) 04/06/2022   Pancytopenia (HCC) 04/06/2022   GI bleeding 04/24/2021   Cirrhosis of liver without ascites (HCC)    Secondary esophageal varices without bleeding (HCC)    Portal hypertension (HCC)    Stomach irritation    Melena    Acute gastrointestinal hemorrhage 07/19/2019   Severe sepsis (HCC) 12/20/2018   [facility]P:  [facility]   [facility] - [place], [place] - [address] [place] [address] Phone: [phone] Fax: [phone]     Social Determinants of Health (SDOH) Social History: SDOH Screenings   Food Insecurity: No Food Insecurity (08/13/2023)  Housing: Low Risk  (08/13/2023)  Transportation Needs: No Transportation Needs (08/13/2023)  Recent Concern: Transportation Needs - Unmet Transportation Needs (06/17/2023)  Utilities: Not At Risk (08/13/2023)  Recent Concern: Utilities - At Risk (06/17/2023)  Depression (PHQ2-9): Low Risk  (04/24/2023)  Financial Resource Strain: Low Risk  (08/06/2023)   Received from [facility]  Social Connections: Unknown (04/29/2022)   Received from [facility]  Tobacco Use: Medium Risk (08/12/2023)   SDOH Interventions:     Readmission Risk Interventions    08/14/2023    2:18 PM 07/15/2023    8:52 AM 06/18/2023   10:19 AM  Readmission Risk Prevention Plan  Transportation Screening Complete Complete Complete  PCP or Specialist Appt within 5-7 Days   Complete  PCP or Specialist Appt within 3-5 Days Complete Complete   Home Care Screening   Complete  Medication Review (RN CM)   Complete  HRI or Home Care Consult Complete    Social Work Consult for Recovery Care Planning/Counseling Complete Complete   Palliative Care Screening Not Applicable Not Applicable   Medication Review Oceanographer) Complete Referral to Pharmacy

## 2023-08-14 NOTE — Assessment & Plan Note (Addendum)
X-ray shows arthritis.  No fracture.  Patient is very stiff all over his body.

## 2023-08-15 LAB — CBC
HCT: 32.5 % — ABNORMAL LOW (ref 39.0–52.0)
Hemoglobin: 11.2 g/dL — ABNORMAL LOW (ref 13.0–17.0)
MCH: 32.7 pg (ref 26.0–34.0)
MCHC: 34.5 g/dL (ref 30.0–36.0)
MCV: 94.8 fL (ref 80.0–100.0)
Platelets: 51 10*3/uL — ABNORMAL LOW (ref 150–400)
RBC: 3.43 MIL/uL — ABNORMAL LOW (ref 4.22–5.81)
RDW: 15.4 % (ref 11.5–15.5)
WBC: 3.7 10*3/uL — ABNORMAL LOW (ref 4.0–10.5)
nRBC: 0 % (ref 0.0–0.2)

## 2023-08-15 LAB — GLUCOSE, CAPILLARY
Glucose-Capillary: 146 mg/dL — ABNORMAL HIGH (ref 70–99)
Glucose-Capillary: 232 mg/dL — ABNORMAL HIGH (ref 70–99)
Glucose-Capillary: 196 mg/dL — ABNORMAL HIGH (ref 70–99)
Glucose-Capillary: 143 mg/dL — ABNORMAL HIGH (ref 70–99)

## 2023-08-15 LAB — COMPREHENSIVE METABOLIC PANEL
ALT: 68 U/L — ABNORMAL HIGH (ref 0–44)
AST: 78 U/L — ABNORMAL HIGH (ref 15–41)
Albumin: 2.6 g/dL — ABNORMAL LOW (ref 3.5–5.0)
Alkaline Phosphatase: 100 U/L (ref 38–126)
Anion gap: 15 (ref 5–15)
BUN: 11 mg/dL (ref 8–23)
CO2: 23 mmol/L (ref 22–32)
Calcium: 9.5 mg/dL (ref 8.9–10.3)
Chloride: 99 mmol/L (ref 98–111)
Creatinine, Ser: 0.72 mg/dL (ref 0.61–1.24)
GFR, Estimated: >60 mL/min (ref >60)
Glucose, Bld: 135 mg/dL — ABNORMAL HIGH (ref 70–99)
Potassium: 3.9 mmol/L (ref 3.5–5.1)
Sodium: 137 mmol/L (ref 135–145)
Total Bilirubin: 1.7 mg/dL — ABNORMAL HIGH (ref 0.3–1.2)
Total Protein: 6.1 g/dL — ABNORMAL LOW (ref 6.5–8.1)

## 2023-08-15 LAB — CK: Total CK: 35 U/L — ABNORMAL LOW (ref 49–397)

## 2023-08-15 MED ORDER — VANCOMYCIN HCL 1500 MG/300ML IV SOLN
1500.0000 mg | Freq: Two times a day (BID) | INTRAVENOUS | Status: DC
  Administered 2023-08-15 – 2023-08-17 (×5): 1500 mg via INTRAVENOUS
  Filled 2023-08-15 (×6): qty 300

## 2023-08-15 NOTE — Evaluation (Signed)
Occupational Therapy Evaluation Patient Details Name: Joshua Berry MRN: [id number] DOB: Nov 18, 1946 Today's Date: 08/15/2023   History of Present Illness Joshua Berry is a 77 y.o. male with medical history significant of NASH, liver cirrhosis, hepatic encephalopathy, esophageal varices, GI bleeding, anemia, thrombocytopenia, HTN, HLD, DM, dCHF, hypothyroidism, OSA not on CPAP, melanoma, s/p of TAVR, recent admission due to subdural hematoma, MRSE bacteremia, who presents with altered mental status. Recent admission 2 weeks ago and was transferred to [facility] and then SNF.   Clinical Impression   Pt and spouse in room with NT, agreeable to OT eval. NT providing TOTAL assist for pt eating. Pt with long hospital course beginning end of July, has been in bed since, and decreased level of arousal caused inability to participate in SNF therapy. In July, pt was living with spouse at ALF, MOD I for mobility and ADLs. Required assist for IADLs. Pt is currently severely deconditioned, stiff/painful with all joint movements and requires TOTAL assist for all mobility. Is able to self-feed with MAX A to load spoon and MOD A to bring to mouth by OT. Anticipate +2 for all mobility to EOB. Educated spouse and pt on bed level ROM exercises to mobilize joints and pressure relief strategies. Given pt's improved level of arousal and ability to participate in therapy, spouse and pt are hopeful to return to SNF for rehab. Patient will benefit from acute OT to increase overall independence in the areas of ADLs, functional mobility in order to safely discharge.        If plan is discharge home, recommend the following: Two people to help with walking and/or transfers;Two people to help with bathing/dressing/bathroom;Help with stairs or ramp for entrance    Functional Status Assessment  Patient has had a recent decline in their functional status and demonstrates the ability to make significant improvements in function in a  reasonable and predictable amount of time.  Equipment Recommendations  Other (comment)       Precautions / Restrictions Precautions Precautions: Fall;Other (comment) (aspiration) Restrictions Weight Bearing Restrictions: No      Mobility Bed Mobility               General bed mobility comments: Requires passive assist to seperate legs, dorsiflex feet, etc.    Transfers                   General transfer comment: unable/unsafe to attempt      Balance Overall balance assessment: Needs assistance                                         ADL either performed or assessed with clinical judgement   ADL Overall ADL's : Needs assistance/impaired Eating/Feeding: Total assistance;Bed level (NT in room, feeding pt. Pt able to verbalize some needs)                                     General ADL Comments: Unsafe to attempt OOB mobility at thsi time. Pt sitting up in chair position, requires significant assist to feed self. Will be TOTAL for all bed level ADLs at this time, +2 to EOB      Pertinent Vitals/Pain Pain Assessment Pain Assessment: Faces Faces Pain Scale: Hurts little more Pain Location: grimaces with movement of all extremeties  Extremity/Trunk Assessment Upper Extremity Assessment Upper Extremity Assessment: Right hand dominant;RUE deficits/detail;LUE deficits/detail (he can raise arms to 90 degs shld flexion, holds for ~3 seconds, can bring BUE to mouth with increased time and cuing) RUE Deficits / Details: recent imaging for pain in R hands, imaging showed OA. LUE Deficits / Details: rigidity and pain with passive rom in all joints of B UEs.   Lower Extremity Assessment Lower Extremity Assessment: Defer to PT evaluation;RLE deficits/detail;LLE deficits/detail RLE Deficits / Details: rigitiy and painful with all active movement. Pt can perform dorsiflexion but with increased efforts LLE Deficits / Details:  significant rigidity/stiffness in B LEs. Limited by pain LLE: Unable to fully assess due to pain       Communication Communication Communication: Difficulty following commands/understanding;Difficulty communicating thoughts/reduced clarity of speech Following commands: Follows one step commands inconsistently;Follows one step commands with increased time Cueing Techniques: Verbal cues;Gestural cues   Cognition Arousal: Alert Behavior During Therapy: Flat affect Overall Cognitive Status: Impaired/Different from baseline (Pt answers basic questions (with LOTS of increased time). Slow processing. Improved from earlier this month (he could barely open eyes). Spouse indicates they have added a stimulant.) Area of Impairment: Problem solving, Awareness, Safety/judgement                 Orientation Level: Disoriented to, Time, Situation   Memory: Decreased short-term memory Following Commands: Follows one step commands with increased time Safety/Judgement: Decreased awareness of safety, Decreased awareness of deficits   Problem Solving: Slow processing, Decreased initiation, Difficulty sequencing, Requires verbal cues, Requires tactile cues General Comments: alert, responds to simple questions     General Comments  Heels floted in bed for pressure relief    Exercises Exercises: General Upper Extremity, General Lower Extremity General Exercises - Upper Extremity Shoulder Flexion: AROM, Both (education to wife/pt regarding bed level UE/LE exercises to perform to promote movement) General Exercises - Lower Extremity Ankle Circles/Pumps: AROM, Both, Other (comment) (2 ankle circles before pt requries physical assist to complete) Heel Slides: Other (comment), AROM (performs 3 before fatiguing/increase pain)   Shoulder Instructions      Home Living Family/patient expects to be discharged to:: Skilled nursing facility (Pt has been back and forth from [facility] since first brain surgery  in July. He has been in bed requiring TOTAL assist for a month.)   Available Help at Discharge: Other (Comment) (Spouse cannot assist pt with ADLs) Type of Home: Other(Comment) (ALF)                       Home Equipment: Rollator (4 wheels);Cane - single point;Shower Counsellor (2 wheels);Wheelchair - Careers adviser (comment)   Additional Comments: Wife states they live at ALF [facility]). Typically, she and pt go down to dining hall for meals and recieve HH PT at their ALF.      Prior Functioning/Environment Prior Level of Function : Needs assist             Mobility Comments: As of July, pt was using a RW for mobility ADLs Comments: Pt was MOD I back in July        OT Problem List: Decreased strength;Decreased range of motion;Decreased activity tolerance;Impaired balance (sitting and/or standing);Decreased cognition;Decreased safety awareness;Decreased knowledge of use of DME or AE;Other (comment);Pain (severely deconditioned)      OT Treatment/Interventions: Self-care/ADL training;Therapeutic exercise;Neuromuscular education;Energy conservation;DME and/or AE instruction;Therapeutic activities;Patient/family education;Balance training    OT Goals(Current goals can be found in the care plan section) Acute Rehab OT  Goals OT Goal Formulation: With patient/family Time For Goal Achievement: 08/29/23 Potential to Achieve Goals: Fair ADL Goals Pt Will Perform Eating: with min assist;with adaptive utensils;sitting Pt Will Perform Grooming: with adaptive equipment;sitting;with min assist Pt Will Perform Upper Body Bathing: sitting;with mod assist  OT Frequency: Min 1X/week       AM-PAC OT "6 Clicks" Daily Activity     Outcome Measure Help from another person eating meals?: A Lot Help from another person taking care of personal grooming?: Total Help from another person toileting, which includes using toliet, bedpan, or urinal?: Total Help from another person  bathing (including washing, rinsing, drying)?: Total Help from another person to put on and taking off regular upper body clothing?: Total Help from another person to put on and taking off regular lower body clothing?: Total 6 Click Score: 7   End of Session Nurse Communication: Other (comment) (pt request for mango icee, spouse will assist pt with eating)  Activity Tolerance: Other (comment);Patient limited by pain (MD enters room) Patient left: in bed;with call bell/phone within reach;with bed alarm set;with family/visitor present;Other (comment) (MD in room)  OT Visit Diagnosis: Other abnormalities of gait and mobility (R26.89);Muscle weakness (generalized) (M62.81)                Time: 1610-9604 OT Time Calculation (min): 30 min Charges:  OT General Charges $OT Visit: 1 Visit OT Evaluation $OT Eval Moderate Complexity: 1 Mod OT Treatments $Self Care/Home Management : 8-22 mins  Maryuri Warnke L. Remington Highbaugh, OTR/L  08/15/23, 10:19 AM

## 2023-08-15 NOTE — Progress Notes (Signed)
Pharmacy Antibiotic Note  Joshua Berry is a 77 y.o. male admitted on 08/12/2023 with bacteremia and MRSE .  Pharmacy has been consulted for Vancomycin dosing. Goal vancomycin trough 15-10mcg/mL since concern for CNS infection.  Plan: Increase to vancomycin 1500 mg IV q12h (AUC 561.7, Css min 15.9) A vancomycin trough level is ordered for prior to the 5th dose on 9/1 Pharmacy will continue to monitor and adjust levels as needed   Height: 6\' 2"  (188 cm) Weight: 95.3 kg (210 lb 1.6 oz) IBW/kg (Calculated) : 82.2  Temp (24hrs), Avg:99 F (37.2 C), Min:98 F (36.7 C), Max:100.7 F (38.2 C)  Recent Labs  Lab 08/12/23 1659 08/13/23 0118 08/15/23 0502  WBC 5.5 4.8 3.7*  CREATININE 0.64 0.76 0.72    Estimated Creatinine Clearance: 88.5 mL/min (by C-G formula based on SCr of 0.72 mg/dL).    Allergies  Allergen Reactions   Camphor Swelling and Rash   Latex Rash   Lisinopril Cough    Other Reaction(s): Cough   Sulfamethoxazole Swelling   Chocolate     Family states he is allergic   Nsaids Other (See Comments)    NASH    Antimicrobials this admission:  8/28 Vanc >>    Dose adjustments this admission: None  Microbiology results: 8/6 Bcx- 2/2 Staph epidermidis 8/8 Bcx- 2/2 NGTD 8/13 Bcx- 2/2 NGTD 8/28 Bcx- 2/2 NGTD  Thank you for allowing pharmacy to be a part of this patient's care.  Merryl Hacker, PharmD Clinical Pharmacist 08/15/2023 9:14 AM

## 2023-08-15 NOTE — Plan of Care (Signed)

## 2023-08-15 NOTE — [place] FL2 (Signed)
[place] MEDICAID FL2 LEVEL OF CARE FORM     IDENTIFICATION  Patient Name: Joshua Berry Birthdate: [birth year] Sex: male Admission Date (Current Location): 08/12/2023  [place] and [place] Number:  Chiropodist and Address:  [facility], [address] [place], [place] [address]      Provider Number: [id number]  Attending Physician Name and Address:  Alford Highland, MD  Relative Name and Phone Number:  Teghan Schneekloth [phone]    Current Level of Care: Hospital Recommended Level of Care: Skilled Nursing Facility Prior Approval Number:    Date Approved/Denied:   PASRR Number: [id number]  Discharge Plan:      Current Diagnoses: Patient Active Problem List   Diagnosis Date Noted   Right wrist pain 08/14/2023   Bilateral subdural hematomas (HCC) 08/13/2023   Malnutrition of moderate degree 08/13/2023   Decubitus ulcer of coccyx, stage I 08/13/2023   Acute metabolic encephalopathy 08/12/2023   Staphylococcus epidermidis bacteremia 07/23/2023   Acute nontraumatic intracranial subdural hematoma (HCC) 07/15/2023   Subdural hematoma (HCC) 07/14/2023   Compression of brain (HCC) 07/14/2023   Uncal herniation (HCC) 07/14/2023   Type 2 diabetes mellitus without complications (HCC) 06/17/2023   GERD without esophagitis 06/17/2023   Dyslipidemia 06/17/2023   Pressure injury of skin 06/17/2023   Acute urinary retention 04/11/2023   Streptococcal bacteremia 04/07/2023   Altered mental status 04/06/2023   Hepatic encephalopathy (HCC) 04/05/2023   Overweight (BMI 25.0-29.9) 08/03/2022   Hyponatremia, hypokalemia and hypomagnesemia 05/18/2022   Hyponatremia 05/18/2022   Hypokalemia 05/18/2022   Back pain 05/17/2022   Hypomagnesemia 05/17/2022   Acute hepatic encephalopathy (HCC) 05/17/2022   AKI (acute kidney injury) (HCC) 05/16/2022   Elevated liver enzymes 05/16/2022   Hypotension 05/16/2022   Right shoulder pain 05/16/2022    Hypothyroidism 05/16/2022   Physical deconditioning 05/16/2022   Obesity (BMI 30-39.9) 05/16/2022   Lactic acidosis 05/16/2022   Fall at home, initial encounter 05/16/2022   Septic shock due to Staphylococcus bacteremia 05/14/2022   Chest pain 04/06/2022   Controlled IDDM-2 with hyperglycemia 04/06/2022   HLD (hyperlipidemia) 04/06/2022   HTN (hypertension) 04/06/2022   Chronic diastolic CHF (congestive heart failure) (HCC) 04/06/2022   Pancytopenia (HCC) 04/06/2022   GI bleeding 04/24/2021   Cirrhosis of liver without ascites (HCC)    Secondary esophageal varices without bleeding (HCC)    Portal hypertension (HCC)    Stomach irritation    Melena    Acute gastrointestinal hemorrhage 07/19/2019   Severe sepsis (HCC) 12/20/2018    Orientation RESPIRATION BLADDER Height & Weight     Self  Normal External catheter Weight: 210 lb 1.6 oz (95.3 kg) Height:  6\' 2"  (188 cm)  BEHAVIORAL SYMPTOMS/MOOD NEUROLOGICAL BOWEL NUTRITION STATUS      Continent Diet (dys 2)  AMBULATORY STATUS COMMUNICATION OF NEEDS Skin   Total Care Verbally (delayed response) Other (Comment), Bruising (stage 1 L buttock; stage 2 mid coccyx)                       Personal Care Assistance Level of Assistance  Bathing, Feeding, Dressing Bathing Assistance: Maximum assistance Feeding assistance: Maximum assistance Dressing Assistance: Maximum assistance     Functional Limitations Info             SPECIAL CARE FACTORS FREQUENCY  PT (By licensed PT), OT (By licensed OT)     PT Frequency: 5 times per week OT Frequency: 5 times per week  Contractures      Additional Factors Info  Code Status, Allergies Code Status Info: DNR Allergies Info: Camphor, Latex, Lisinopril, Sulfamethoxazole, Chocolate, Nsaids           Current Medications (08/15/2023):  This is the current hospital active medication list Current Facility-Administered Medications  Medication Dose Route Frequency  Provider Last Rate Last Admin   acetaminophen (TYLENOL) suppository 650 mg  650 mg Rectal Q6H PRN Lorretta Harp, MD       acetaminophen (TYLENOL) tablet 650 mg  650 mg Oral Q6H PRN Lorretta Harp, MD   650 mg at 08/14/23 2106   acidophilus (RISAQUAD) capsule 1 capsule  1 capsule Oral Daily Lorretta Harp, MD   1 capsule at 08/14/23 1047   amphetamine-dextroamphetamine (ADDERALL) tablet 10 mg  10 mg Oral Daily Alford Highland, MD   10 mg at 08/14/23 1046   atorvastatin (LIPITOR) tablet 20 mg  20 mg Oral QHS Lorretta Harp, MD   20 mg at 08/14/23 2106   butalbital-acetaminophen-caffeine (FIORICET) 50-325-40 MG per tablet 2 tablet  2 tablet Oral Q8H PRN Lorretta Harp, MD       cholecalciferol (VITAMIN D3) 25 MCG (1000 UNIT) tablet 2,000 Units  2,000 Units Oral Daily Lorretta Harp, MD   2,000 Units at 08/14/23 1047   famotidine (PEPCID) tablet 20 mg  20 mg Oral Daily Lorretta Harp, MD   20 mg at 08/14/23 1047   feeding supplement (ENSURE ENLIVE / ENSURE PLUS) liquid 237 mL  237 mL Oral TID BM Lorretta Harp, MD   237 mL at 08/14/23 2107   hydrALAZINE (APRESOLINE) injection 5 mg  5 mg Intravenous Q2H PRN Lorretta Harp, MD       insulin aspart (novoLOG) injection 0-5 Units  0-5 Units Subcutaneous QHS Lorretta Harp, MD       insulin aspart (novoLOG) injection 0-9 Units  0-9 Units Subcutaneous TID WC Lorretta Harp, MD   2 Units at 08/14/23 1824   insulin glargine-yfgn (SEMGLEE) injection 10 Units  10 Units Subcutaneous QHS Lorretta Harp, MD   10 Units at 08/14/23 2106   ketotifen (ZADITOR) 0.035 % ophthalmic solution 1 drop  1 drop Both Eyes BID Lorretta Harp, MD   1 drop at 08/14/23 2109   lactulose (CHRONULAC) 10 GM/15ML solution 20 g  20 g Oral QHS Lorretta Harp, MD   20 g at 08/14/23 2105   lactulose (CHRONULAC) 10 GM/15ML solution 30 g  30 g Oral BID Lorretta Harp, MD   30 g at 08/14/23 1452   levothyroxine (SYNTHROID) tablet 100 mcg  100 mcg Oral Q0600 Lorretta Harp, MD   100 mcg at 08/15/23 0559   multivitamin with minerals tablet 1 tablet  1 tablet  Oral Daily Lorretta Harp, MD   1 tablet at 08/14/23 1047   omega-3 acid ethyl esters (LOVAZA) capsule 1 g  1 g Oral Daily Lorretta Harp, MD   1 g at 08/14/23 1046   ondansetron (ZOFRAN) injection 4 mg  4 mg Intravenous Q8H PRN Lorretta Harp, MD       pantoprazole (PROTONIX) EC tablet 40 mg  40 mg Oral BID Lorretta Harp, MD   40 mg at 08/14/23 2105   rifaximin (XIFAXAN) tablet 550 mg  550 mg Oral BID Lorretta Harp, MD   550 mg at 08/14/23 2106   terazosin (HYTRIN) capsule 5 mg  5 mg Oral QHS Lorretta Harp, MD   5 mg at 08/14/23 2106   vancomycin (VANCOREADY) IVPB 1250 mg/250 mL  1,250 mg  Intravenous Q12H Lorretta Harp, MD 166.7 mL/hr at 08/15/23 0222 1,250 mg at 08/15/23 0222     Discharge Medications: Please see discharge summary for a list of discharge medications.  Relevant Imaging Results:  Relevant Lab Results:   Additional Information SS #: [id number]  Jarrel Knoke E Monay Houlton, LCSW

## 2023-08-15 NOTE — TOC Progression Note (Addendum)
Transition of Care [facility]) - Progression Note    Patient Details  Name: Joshua Berry MRN: [id number] Date of Birth: 1946-07-24  Transition of [facility]) CM/SW Contact  Liliana Cline, LCSW Phone Number: 08/15/2023, 9:11 AM  Clinical Narrative:    CSW reached out to Tiffany at [facility] - informed her that patient will need additional STR at [place]. Asked to confirm patient can return to [facility] at DC. Awaiting reply.   1:28- Called Verlon Au at [facility]. She confirms patient can come back to [facility] for more short-term rehab at [place]. She states patient has Medicare and was there using that benefit which can continue. She states the earliest they can take the patient is Tuesday if medically ready.    Expected Discharge Plan: Skilled Nursing Facility Barriers to Discharge: Barriers Resolved  Expected Discharge Plan and Services                                               Social Determinants of Health (SDOH) Interventions SDOH Screenings   Food Insecurity: No Food Insecurity (08/13/2023)  Housing: Low Risk  (08/13/2023)  Transportation Needs: No Transportation Needs (08/13/2023)  Recent Concern: Transportation Needs - Unmet Transportation Needs (06/17/2023)  Utilities: Not At Risk (08/13/2023)  Recent Concern: Utilities - At Risk (06/17/2023)  Depression (PHQ2-9): Low Risk  (04/24/2023)  Financial Resource Strain: Low Risk  (08/06/2023)   Received from [facility]  Social Connections: Unknown (04/29/2022)   Received from [facility]  Tobacco Use: Medium Risk (08/12/2023)    Readmission Risk Interventions    08/14/2023    2:18 PM 07/15/2023    8:52 AM 06/18/2023   10:19 AM  Readmission Risk Prevention Plan  Transportation Screening Complete Complete Complete  PCP or Specialist Appt within 5-7 Days   Complete  PCP or Specialist Appt within 3-5 Days Complete Complete   Home Care Screening   Complete  Medication Review (RN CM)    Complete  HRI or Home Care Consult Complete    Social Work Consult for Recovery Care Planning/Counseling Complete Complete   Palliative Care Screening Not Applicable Not Applicable   Medication Review Oceanographer) Complete Referral to Pharmacy

## 2023-08-15 NOTE — Progress Notes (Signed)
Progress Note   [name]:[id number] DOB: 15-Apr-1946 DOA: 08/12/2023     3 DOS: the patient was seen and examined on 08/15/2023   Brief hospital course: 77 y.o. male with medical history significant of NASH, liver cirrhosis, hepatic encephalopathy, esophageal varices, GI bleeding, anemia, thrombocytopenia, HTN, HLD, DM, dCHF, hypothyroidism, OSA not on CPAP, melanoma, s/p of TAVR, recent admission due to subdural hematoma, MRSE bacteremia, who presents with altered mental status.   Patient was recently hospitalized from 7/29 - 8/16 due to subdural hematoma with uncal hernia.  Patient was s/p of s/p bilateral frontal burr hole washout procedure on 7/29, and then transferred to [facility] where pt had MMA embolization with NIR 8/19, then d/c'd to rehab facility on Friday (8/23). Per discharge summary, pt also had MRSE bacteremia. "Since pt had a subdural drain that had drained spinal fluid, there is concern for noscomial meningitis. Since has high risk for doing LP, decision was made to start tx for meningitis empirically. 8/15 as per ID patient may need vancomycin for 2 to 4 weeks".  Per his daughter, for reason vancomycin was not continued after patient was transferred to [facility].   Per his daughter, patient was alert and orientated x 3 when patient was transferred to rehab facility on 8/23, but becomes confused in the past several days.  When I saw patient in ED, patient is obviously confused and lethargic.  He is arousable and still orientated x 3 when aroused.  He moves all extremities.  No facial droop or slurred speech.  Patient does not have chest pain, cough or SOB.  No nausea, vomiting or abdominal pain.  Patient has loose stool which his daughter attributes to lactulose use.  No symptoms of UTI.  8/28.  Neurosurgery did not want to do any invasive procedure at this time.  Patient started on IV vancomycin for previous Staph epidermidis in blood cultures.  Patient's family interested in  starting a stimulant so he can be up and awake and eat. 8/29.  Patient more alert today.  Very stiff.  As per patient's wife ate better last night. 8/30.  Patient more alert today than yesterday.  Ate a better breakfast this morning.  Assessment and Plan: * Acute metabolic encephalopathy Could be secondary to partially treated Staph epidermidis infection.  Vancomycin restarted.  Case discussed with infectious disease specialist if blood cultures negative for 5 days then can discontinue the vancomycin after that.  Patient seems more alert with a trial of stimulant Adderall.  Ammonia level not that high I do not think this is hepatic encephalopathy.  CT scan showed improvement of subdural hematoma.  Keppra discontinued (spoke with neurology about this).  Staphylococcus epidermidis bacteremia Previous hospitalization ID recommended 2 to 4 weeks of IV vancomycin but this was not continued after discharge from [facility].  Empirically on vancomycin and repeat blood cultures so far negative.  Will empirically have on vancomycin until blood cultures are negative for 5 days then will discontinue.  Subdural hematoma (HCC) Bilateral subdural hematomas and uncal herniation.  Status post bilateral frontal burr hole and then had an embolization procedure.  Discontinued Keppra since this could be contributing to the altered mental status.  Cirrhosis of liver without ascites (HCC) Thrombocytopenia and elevated liver function test secondary to cirrhosis.  Continue Xifaxan.  Acute hepatic encephalopathy (HCC) Continue Xifaxan.  Pancytopenia (HCC) Platelet count 51, hemoglobin 11.2 and white cell count 3.7  Chronic diastolic CHF (congestive heart failure) (HCC) No signs of heart failure.  Last EF 60%.  HLD (hyperlipidemia) On Lipitor.  CK normal range  GERD without esophagitis On Protonix  Controlled IDDM-2 with hyperglycemia On Semglee insulin and sliding scale  Hypothyroidism On levothyroxine, last TSH  normal range.  Right wrist pain X-ray shows arthritis.  No fracture.  Patient is very stiff all over his body.  PT and OT consultation.  Decubitus ulcer of coccyx, stage I Also on buttock.  See full description below, present on admission.  Malnutrition of moderate degree Continue supplements        Subjective: Patient more alert today.  Offers no complaints.  As per wife ate better today.  Admitted with altered mental status and poor appetite.  Physical Exam: Vitals:   08/15/23 0500 08/15/23 0600 08/15/23 0700 08/15/23 1147  BP: (!) 97/59 (!) 108/53  (!) 113/56  Pulse:   72 78  Resp: 18 12  16   Temp:   98 F (36.7 C) 98.5 F (36.9 C)  TempSrc:   Oral Oral  SpO2:   98% 99%  Weight:      Height:       Physical Exam HENT:     Head: Normocephalic.     Mouth/Throat:     Pharynx: No oropharyngeal exudate.  Eyes:     General: Lids are normal.     Conjunctiva/sclera: Conjunctivae normal.  Cardiovascular:     Rate and Rhythm: Normal rate and regular rhythm.     Heart sounds: Normal heart sounds, S1 normal and S2 normal.  Pulmonary:     Breath sounds: Examination of the right-lower field reveals decreased breath sounds. Examination of the left-lower field reveals decreased breath sounds. Decreased breath sounds present. No wheezing, rhonchi or rales.  Abdominal:     Palpations: Abdomen is soft.     Tenderness: There is no abdominal tenderness.  Musculoskeletal:     Right lower leg: Swelling present.     Left lower leg: Swelling present.  Skin:    General: Skin is warm.     Findings: No rash.  Neurological:     Mental Status: He is alert.     Comments: Barely able to straight leg raise.  Able to lift arms up off of bed.     Data Reviewed: Platelet count 51, hemoglobin 11.2, white blood cell count 3.7, creatinine 0.72, AST 78 ALT 68, total bilirubin 1.7  Family Communication: Spoke with wife at the bedside  Disposition: Status is: Inpatient Remains inpatient  appropriate because: Will continue IV vancomycin until blood cultures are negative for 5 days.  Planned Discharge Destination: Rehab    Time spent: 28 minutes  Author: Alford Highland, MD 08/15/2023 2:51 PM  For on call review [email or web address].

## 2023-08-16 LAB — GLUCOSE, CAPILLARY
Glucose-Capillary: 148 mg/dL — ABNORMAL HIGH (ref 70–99)
Glucose-Capillary: 241 mg/dL — ABNORMAL HIGH (ref 70–99)
Glucose-Capillary: 131 mg/dL — ABNORMAL HIGH (ref 70–99)
Glucose-Capillary: 146 mg/dL — ABNORMAL HIGH (ref 70–99)

## 2023-08-16 NOTE — Progress Notes (Signed)
Progress Note   [name] ATF:[id number] DOB: [birth year] DOA: 08/12/2023     4 DOS: the patient was seen and examined on 08/16/2023   Brief hospital course: 77 y.o. male with medical history significant of NASH, liver cirrhosis, hepatic encephalopathy, esophageal varices, GI bleeding, anemia, thrombocytopenia, HTN, HLD, DM, dCHF, hypothyroidism, OSA not on CPAP, melanoma, s/p of TAVR, recent admission due to subdural hematoma, MRSE bacteremia, who presents with altered mental status.   Patient was recently hospitalized from 7/29 - 8/16 due to subdural hematoma with uncal hernia.  Patient was s/p of s/p bilateral frontal burr hole washout procedure on 7/29, and then transferred to [facility] where pt had MMA embolization with NIR 8/19, then d/c'd to rehab facility on Friday (8/23). Per discharge summary, pt also had MRSE bacteremia. "Since pt had a subdural drain that had drained spinal fluid, there is concern for noscomial meningitis. Since has high risk for doing LP, decision was made to start tx for meningitis empirically. 8/15 as per ID patient may need vancomycin for 2 to 4 weeks".  Per his daughter, for reason vancomycin was not continued after patient was transferred to [facility].   Per his daughter, patient was alert and orientated x 3 when patient was transferred to rehab facility on 8/23, but becomes confused in the past several days.  When I saw patient in ED, patient is obviously confused and lethargic.  He is arousable and still orientated x 3 when aroused.  He moves all extremities.  No facial droop or slurred speech.  Patient does not have chest pain, cough or SOB.  No nausea, vomiting or abdominal pain.  Patient has loose stool which his daughter attributes to lactulose use.  No symptoms of UTI.  8/28.  Neurosurgery did not want to do any invasive procedure at this time.  Patient started on IV vancomycin for previous Staph epidermidis in blood cultures.  Patient's family interested in  starting a stimulant so he can be up and awake and eat. 8/29.  Patient more alert today.  Very stiff.  As per patient's wife ate better last night. 8/30.  Patient more alert today than yesterday.  Ate a better breakfast this morning. 8/31.  Patient holding more of a conversation today than yesterday.  Eating more.  Assessment and Plan: * Acute metabolic encephalopathy Could be secondary to partially treated Staph epidermidis infection.  Vancomycin restarted.  Case discussed with infectious disease specialist if blood cultures negative for 5 days then can discontinue the vancomycin after that.  Blood cultures negative for 3 days.  Patient's wife thinks that he improved after starting Adderall.  I will continue this upon discharge.  Keppra also discontinued.  Staphylococcus epidermidis bacteremia Previous hospitalization ID recommended 2 to 4 weeks of IV vancomycin but this was not continued after discharge from [facility].  Empirically on vancomycin and repeat blood cultures so far negative for 3 days.  Will empirically have on vancomycin until blood cultures are negative for 5 days then will discontinue.  Subdural hematoma (HCC) Bilateral subdural hematomas and uncal herniation.  Status post bilateral frontal burr hole and then had an embolization procedure.  Discontinued Keppra since this could be contributing to the altered mental status.  Cirrhosis of liver without ascites (HCC) Thrombocytopenia and elevated liver function test secondary to cirrhosis.  Continue Xifaxan.  Acute hepatic encephalopathy (HCC) Continue Xifaxan.  Pancytopenia (HCC) Platelet count 51, hemoglobin 11.2 and white cell count 3.7  Chronic diastolic CHF (congestive heart failure) (HCC) No  signs of heart failure.  Last EF 60%.  HLD (hyperlipidemia) On Lipitor.  CK normal range  GERD without esophagitis On Protonix  Controlled IDDM-2 with hyperglycemia On Semglee insulin and sliding scale  Hypothyroidism On  levothyroxine, last TSH normal range.  Right wrist pain X-ray shows arthritis.  No fracture.  Patient is very stiff all over his body.   Decubitus ulcer of coccyx, stage I Also on buttock.  See full description below, present on admission.  Malnutrition of moderate degree Continue supplements        Subjective: Patient seen during breakfast time.  He was scared that his breakfast may get cold.  He was able to hold his arms up over his head for a while.  Upper extremities left stiff.  Lower extremity still pretty stiff.  Physical Exam: Vitals:   08/16/23 0800 08/16/23 0841 08/16/23 0900 08/16/23 1235  BP: (!) 99/56  (!) 122/56 (!) 114/55  Pulse: (!) 50  (!) 56   Resp: 17  14   Temp:  98 F (36.7 C)  98 F (36.7 C)  TempSrc:    Oral  SpO2: 95%  94%   Weight:      Height:       Physical Exam HENT:     Head: Normocephalic.     Mouth/Throat:     Pharynx: No oropharyngeal exudate.  Eyes:     General: Lids are normal.     Conjunctiva/sclera: Conjunctivae normal.  Cardiovascular:     Rate and Rhythm: Normal rate and regular rhythm.     Heart sounds: Normal heart sounds, S1 normal and S2 normal.  Pulmonary:     Breath sounds: Examination of the right-lower field reveals decreased breath sounds. Examination of the left-lower field reveals decreased breath sounds. Decreased breath sounds present. No wheezing, rhonchi or rales.  Abdominal:     Palpations: Abdomen is soft.     Tenderness: There is no abdominal tenderness.  Musculoskeletal:     Right lower leg: Swelling present.     Left lower leg: Swelling present.  Skin:    General: Skin is warm.     Findings: No rash.  Neurological:     Mental Status: He is alert.     Comments: Able to hold the better conversation today.  Still very stiff with his lower extremities and barely able to straight leg raise.  Able to hold arms up overhead on his own once I was able to get his elbows up off the bed.     Data Reviewed: White  blood count 3.7, hemoglobin 11.2, platelet count 51 Family Communication: Spoke with wife on the phone  Disposition: Status is: Inpatient Remains inpatient appropriate because: Will need to go back to rehab  Planned Discharge Destination: Rehab    Time spent: 28 minutes  Author: Alford Highland, MD 08/16/2023 4:15 PM  For on call review [email or web address].

## 2023-08-16 NOTE — Plan of Care (Signed)

## 2023-08-17 LAB — GLUCOSE, CAPILLARY
Glucose-Capillary: 129 mg/dL — ABNORMAL HIGH (ref 70–99)
Glucose-Capillary: 209 mg/dL — ABNORMAL HIGH (ref 70–99)
Glucose-Capillary: 171 mg/dL — ABNORMAL HIGH (ref 70–99)
Glucose-Capillary: 97 mg/dL (ref 70–99)

## 2023-08-17 LAB — CBC
HCT: 30.6 % — ABNORMAL LOW (ref 39.0–52.0)
Hemoglobin: 10.7 g/dL — ABNORMAL LOW (ref 13.0–17.0)
MCH: 32.9 pg (ref 26.0–34.0)
MCHC: 35 g/dL (ref 30.0–36.0)
MCV: 94.2 fL (ref 80.0–100.0)
Platelets: 61 10*3/uL — ABNORMAL LOW (ref 150–400)
RBC: 3.25 MIL/uL — ABNORMAL LOW (ref 4.22–5.81)
RDW: 15.3 % (ref 11.5–15.5)
WBC: 2.7 10*3/uL — ABNORMAL LOW (ref 4.0–10.5)
nRBC: 0 % (ref 0.0–0.2)

## 2023-08-17 LAB — BASIC METABOLIC PANEL
Anion gap: 7 (ref 5–15)
BUN: 14 mg/dL (ref 8–23)
CO2: 22 mmol/L (ref 22–32)
Calcium: 8.5 mg/dL — ABNORMAL LOW (ref 8.9–10.3)
Chloride: 103 mmol/L (ref 98–111)
Creatinine, Ser: 0.7 mg/dL (ref 0.61–1.24)
GFR, Estimated: >60 mL/min (ref >60)
Glucose, Bld: 107 mg/dL — ABNORMAL HIGH (ref 70–99)
Potassium: 3.8 mmol/L (ref 3.5–5.1)
Sodium: 132 mmol/L — ABNORMAL LOW (ref 135–145)

## 2023-08-17 LAB — VANCOMYCIN, TROUGH: Vancomycin Tr: 22 ug/mL (ref 15–20)

## 2023-08-17 MED ORDER — VANCOMYCIN HCL 1250 MG/250ML IV SOLN
1250.0000 mg | Freq: Two times a day (BID) | INTRAVENOUS | Status: DC
  Administered 2023-08-18: 1250 mg via INTRAVENOUS
  Filled 2023-08-17: qty 250

## 2023-08-17 NOTE — Progress Notes (Signed)
Progress Note   [name]:[id number] DOB: Jun 16, 1946 DOA: 08/12/2023     5 DOS: the patient was seen and examined on 08/17/2023   Brief hospital course: 77 y.o. male with medical history significant of NASH, liver cirrhosis, hepatic encephalopathy, esophageal varices, GI bleeding, anemia, thrombocytopenia, HTN, HLD, DM, dCHF, hypothyroidism, OSA not on CPAP, melanoma, s/p of TAVR, recent admission due to subdural hematoma, MRSE bacteremia, who presents with altered mental status.   Patient was recently hospitalized from 7/29 - 8/16 due to subdural hematoma with uncal hernia.  Patient was s/p of s/p bilateral frontal burr hole washout procedure on 7/29, and then transferred to [facility] where pt had MMA embolization with NIR 8/19, then d/c'd to rehab facility on Friday (8/23). Per discharge summary, pt also had MRSE bacteremia. "Since pt had a subdural drain that had drained spinal fluid, there is concern for noscomial meningitis. Since has high risk for doing LP, decision was made to start tx for meningitis empirically. 8/15 as per ID patient may need vancomycin for 2 to 4 weeks".  Per his daughter, for reason vancomycin was not continued after patient was transferred to [facility].   Per his daughter, patient was alert and orientated x 3 when patient was transferred to rehab facility on 8/23, but becomes confused in the past several days.  When I saw patient in ED, patient is obviously confused and lethargic.  He is arousable and still orientated x 3 when aroused.  He moves all extremities.  No facial droop or slurred speech.  Patient does not have chest pain, cough or SOB.  No nausea, vomiting or abdominal pain.  Patient has loose stool which his daughter attributes to lactulose use.  No symptoms of UTI.  8/28.  Neurosurgery did not want to do any invasive procedure at this time.  Patient started on IV vancomycin for previous Staph epidermidis in blood cultures.  Patient's family interested in  starting a stimulant so he can be up and awake and eat. 8/29.  Patient more alert today.  Very stiff.  As per patient's wife ate better last night. 8/30.  Patient more alert today than yesterday.  Ate a better breakfast this morning. 8/31.  Patient holding more of a conversation today than yesterday.  Eating more. 9/1.  Patient able to straight leg raise on his own today.    Assessment and Plan: * Acute metabolic encephalopathy Could be secondary to partially treated Staph epidermidis infection.  Vancomycin restarted.  Case discussed with infectious disease specialist if blood cultures negative for 5 days then can discontinue the vancomycin after that.  Blood cultures negative for 4 days.  Patient's wife thinks that he improved after starting Adderall.  I will continue this upon discharge.  Keppra also discontinued.  Staphylococcus epidermidis bacteremia Previous hospitalization ID recommended 2 to 4 weeks of IV vancomycin but this was not continued after discharge from [facility].  Empirically on vancomycin and repeat blood cultures so far negative for 4 days.  Will empirically have on vancomycin until blood cultures are negative for 5 days then will discontinue.  Subdural hematoma (HCC) Bilateral subdural hematomas and uncal herniation.  Status post bilateral frontal burr hole and then had an embolization procedure.  Discontinued Keppra.  Cirrhosis of liver without ascites (HCC) Pancytopenia and elevated liver function test secondary to cirrhosis.  Continue Xifaxan.  Acute hepatic encephalopathy (HCC) Continue Xifaxan.  Pancytopenia (HCC) Platelet count 61, hemoglobin 10.7 and white cell count 2.7  Chronic diastolic CHF (congestive  heart failure) (HCC) No signs of heart failure.  Last EF 60%.  HLD (hyperlipidemia) On Lipitor.  CK normal range  GERD without esophagitis On Protonix  Controlled IDDM-2 with hyperglycemia On Semglee insulin and sliding scale  Hypothyroidism On  levothyroxine, last TSH normal range.  Right wrist pain X-ray shows arthritis.  No fracture.  Patient is very stiff all over his body.   Decubitus ulcer of coccyx, stage I Also on buttock.  See full description below, present on admission.  Malnutrition of moderate degree Continue supplements        Subjective: Patient able to straight leg raise on his own today.  Able to lift his arms over his head on his own today.  Very alert.  Was able to use the call bell to call the nurse to help feed him.  Physical Exam: Vitals:   08/17/23 0435 08/17/23 0500 08/17/23 0757 08/17/23 1209  BP:  (!) 105/57  108/80  Pulse: 70 69    Resp: 13 13    Temp: 98.1 F (36.7 C)  97.8 F (36.6 C) 98.2 F (36.8 C)  TempSrc: Oral  Oral Oral  SpO2: 97% 97%    Weight:      Height:       Physical Exam HENT:     Head: Normocephalic.     Mouth/Throat:     Pharynx: No oropharyngeal exudate.  Eyes:     General: Lids are normal.     Conjunctiva/sclera: Conjunctivae normal.  Cardiovascular:     Rate and Rhythm: Normal rate and regular rhythm.     Heart sounds: Normal heart sounds, S1 normal and S2 normal.  Pulmonary:     Breath sounds: Examination of the right-lower field reveals decreased breath sounds. Examination of the left-lower field reveals decreased breath sounds. Decreased breath sounds present. No wheezing, rhonchi or rales.  Abdominal:     Palpations: Abdomen is soft.     Tenderness: There is no abdominal tenderness.  Musculoskeletal:     Right lower leg: Swelling present.     Left lower leg: Swelling present.  Skin:    General: Skin is warm.     Findings: No rash.  Neurological:     Mental Status: He is alert.     Comments: Able to straight leg raise on his own today.  Able to hold onto the bed rails and pull himself up a little bit.  Able to lift his arms up over his head on his own.     Data Reviewed: Sodium 132, creatinine 0.7, white blood cell count 2.7, hemoglobin 10.7,  platelet count 61  Family Communication: Updated patient's wife on the phone  Disposition: Status is: Inpatient Remains inpatient appropriate because: Will be able to go out to rehab until the holiday weekend is over.  Planned Discharge Destination: Rehab    Time spent: 28 minutes  Author: Alford Highland, MD 08/17/2023 2:01 PM  For on call review [email or web address].

## 2023-08-17 NOTE — Progress Notes (Signed)
Pharmacy Antibiotic Note  Joshua Berry is a 77 y.o. male admitted on 08/12/2023 with bacteremia and MRSE . PMH significant for NASH, liver cirrhosis, esophageal varices, anemia, thrombocytopenia, HTN, HLD, T2DM, dCHF, hypothyroidism, OSA, melanoma. Pharmacy has been consulted for vancomycin dosing.  Plan: Day 5 of antibiotics Current regimen: vancomycin 1500 mg Q12H 9/1 1245 Vanc tr: 22 Time adjusted Vanc tr: 19.1 Decrease to vancomycin 1250 mg IV Q12H. Goal trough 15-66mcg/mL due to concern for CNS infection. Expected Css min: 15.9 Continue to monitor renal function and follow culture results    Height: 6\' 2"  (188 cm) Weight: 95.3 kg (210 lb 1.6 oz) IBW/kg (Calculated) : 82.2  Temp (24hrs), Avg:98.2 F (36.8 C), Min:97.8 F (36.6 C), Max:98.6 F (37 C)  Recent Labs  Lab 08/12/23 1659 08/13/23 0118 08/15/23 0502 08/17/23 0603 08/17/23 1245  WBC 5.5 4.8 3.7* 2.7*  --   CREATININE 0.64 0.76 0.72 0.70  --   VANCOTROUGH  --   --   --   --  22*    Estimated Creatinine Clearance: 88.5 mL/min (by C-G formula based on SCr of 0.7 mg/dL).    Allergies  Allergen Reactions   Camphor Swelling and Rash   Latex Rash   Lisinopril Cough    Other Reaction(s): Cough   Sulfamethoxazole Swelling   Chocolate     Family states he is allergic   Nsaids Other (See Comments)    NASH    Antimicrobials this admission: 8/28 Vancomycin >>   Dose adjustments this admission: 9/1 Vancomycin 1500 mg IV Q12H >> Vancomycin 1250 mg IV Q12H   Microbiology results: 8/6 BCx: MRSE (R- cipro, clinda, oxacillin) 8/28 BCx: NG4D  Thank you for allowing pharmacy to be a part of this patient's care.  Celene Squibb, PharmD Clinical Pharmacist 08/17/2023 9:04 PM

## 2023-08-17 NOTE — Plan of Care (Signed)

## 2023-08-18 DIAGNOSIS — E038 Other specified hypothyroidism: Secondary | ICD-10-CM

## 2023-08-18 LAB — CULTURE, BLOOD (ROUTINE X 2)
Culture: NO GROWTH
Culture: NO GROWTH
Special Requests: ADEQUATE

## 2023-08-18 LAB — GLUCOSE, CAPILLARY
Glucose-Capillary: 120 mg/dL — ABNORMAL HIGH (ref 70–99)
Glucose-Capillary: 189 mg/dL — ABNORMAL HIGH (ref 70–99)
Glucose-Capillary: 145 mg/dL — ABNORMAL HIGH (ref 70–99)
Glucose-Capillary: 144 mg/dL — ABNORMAL HIGH (ref 70–99)

## 2023-08-18 MED ORDER — LACTULOSE 10 GM/15ML PO SOLN
15.0000 g | Freq: Four times a day (QID) | ORAL | Status: DC
  Administered 2023-08-19 – 2023-08-20 (×4): 15 g via ORAL
  Filled 2023-08-18 (×4): qty 30

## 2023-08-18 NOTE — Progress Notes (Signed)
,  CRITICAL VALUE STICKER  CRITICAL VALUE: critical lab  RECEIVER Sheria Lang, RN  DATE & TIME NOTIFIED: 08/17/23 1956  MD NOTIFIED: Dr. Para March  TIME OF NOTIFICATION:2010  RESPONSE: Dose adjustment via pharmacy

## 2023-08-18 NOTE — Progress Notes (Signed)
Progress Note   [name]:[id number] DOB: 11/19/1946 DOA: 08/12/2023     6 DOS: the patient was seen and examined on 08/18/2023   Brief hospital course: 77 y.o. male with medical history significant of NASH, liver cirrhosis, hepatic encephalopathy, esophageal varices, GI bleeding, anemia, thrombocytopenia, HTN, HLD, DM, dCHF, hypothyroidism, OSA not on CPAP, melanoma, s/p of TAVR, recent admission due to subdural hematoma, MRSE bacteremia, who presents with altered mental status.   Patient was recently hospitalized from 7/29 - 8/16 due to subdural hematoma with uncal hernia.  Patient was s/p of s/p bilateral frontal burr hole washout procedure on 7/29, and then transferred to [facility] where pt had MMA embolization with NIR 8/19, then d/c'd to rehab facility on Friday (8/23). Per discharge summary, pt also had MRSE bacteremia. "Since pt had a subdural drain that had drained spinal fluid, there is concern for noscomial meningitis. Since has high risk for doing LP, decision was made to start tx for meningitis empirically. 8/15 as per ID patient may need vancomycin for 2 to 4 weeks".  Per his daughter, for reason vancomycin was not continued after patient was transferred to [facility].   Per his daughter, patient was alert and orientated x 3 when patient was transferred to rehab facility on 8/23, but becomes confused in the past several days.  When I saw patient in ED, patient is obviously confused and lethargic.  He is arousable and still orientated x 3 when aroused.  He moves all extremities.  No facial droop or slurred speech.  Patient does not have chest pain, cough or SOB.  No nausea, vomiting or abdominal pain.  Patient has loose stool which his daughter attributes to lactulose use.  No symptoms of UTI.  8/28.  Neurosurgery did not want to do any invasive procedure at this time.  Patient started on IV vancomycin for previous Staph epidermidis in blood cultures.  Patient's family interested in  starting a stimulant so he can be up and awake and eat. 8/29.  Patient more alert today.  Very stiff.  As per patient's wife ate better last night. 8/30.  Patient more alert today than yesterday.  Ate a better breakfast this morning. 8/31.  Patient holding more of a conversation today than yesterday.  Eating more. 9/1.  Patient able to straight leg raise on his own today.   9/2.  Patient asking more questions today.  Assessment and Plan: * Acute metabolic encephalopathy Could be secondary to partially treated Staph epidermidis infection.  Vancomycin completed today since blood cultures negative for 5 days.  Patient's wife thinks that he improved after starting Adderall.  I will continue this upon discharge.  Keppra also discontinued.  Staphylococcus epidermidis bacteremia Previous hospitalization ID recommended 2 to 4 weeks of IV vancomycin but this was not continued after discharge from [facility].  Empirically on vancomycin and repeat blood cultures so far negative for 5 days.  Will discontinue vancomycin.  Subdural hematoma (HCC) Bilateral subdural hematomas and uncal herniation.  Status post bilateral frontal burr hole and then had an embolization procedure.  Discontinued Keppra.  Cirrhosis of liver without ascites (HCC) Pancytopenia and elevated liver function test secondary to cirrhosis.  Continue Xifaxan.  Acute hepatic encephalopathy (HCC) Continue Xifaxan.  Pancytopenia (HCC) Platelet count 61, hemoglobin 10.7 and white cell count 2.7  Chronic diastolic CHF (congestive heart failure) (HCC) No signs of heart failure.  Last EF 60%.  HLD (hyperlipidemia) On Lipitor.  CK normal range  GERD without esophagitis On  Protonix  Controlled IDDM-2 with hyperglycemia On Semglee insulin and sliding scale  Hypothyroidism On levothyroxine, last TSH normal range.  Right wrist pain X-ray shows arthritis.  No fracture.  Patient is very stiff all over his body.   Decubitus ulcer of coccyx,  stage I Also on buttock.  See full description below, present on admission.  Malnutrition of moderate degree Continue supplements        Subjective: Patient asking more questions today.  Able to straight leg raise.  Able to lift his arms up over his head.  Admitted with altered mental status.  Physical Exam: Vitals:   08/18/23 0740 08/18/23 0745 08/18/23 0750 08/18/23 0755  BP:   129/65   Pulse: 71 64 70 75  Resp: 11 13 10 17   Temp:   97.9 F (36.6 C)   TempSrc:   Oral   SpO2: 100% 98% 99% 96%  Weight:      Height:       Physical Exam HENT:     Head: Normocephalic.     Mouth/Throat:     Pharynx: No oropharyngeal exudate.  Eyes:     General: Lids are normal.     Conjunctiva/sclera: Conjunctivae normal.  Cardiovascular:     Rate and Rhythm: Normal rate and regular rhythm.     Heart sounds: Normal heart sounds, S1 normal and S2 normal.  Pulmonary:     Breath sounds: Examination of the right-lower field reveals decreased breath sounds. Examination of the left-lower field reveals decreased breath sounds. Decreased breath sounds present. No wheezing, rhonchi or rales.  Abdominal:     Palpations: Abdomen is soft.     Tenderness: There is no abdominal tenderness.  Musculoskeletal:     Right lower leg: Swelling present.     Left lower leg: Swelling present.  Skin:    General: Skin is warm.     Findings: No rash.  Neurological:     Mental Status: He is alert.     Comments: Patient able to ask more questions today and inquire about his health.  Patient able to straight leg raise and lift up his arms up off the bed.     Data Reviewed: Last hemoglobin 10.7.  Family Communication: Updated wife on the phone  Disposition: Status is: Inpatient Remains inpatient appropriate because: Hopefully to [employer] tomorrow  Planned Discharge Destination: Skilled nursing facility    Time spent: 28 minutes Case discussed with nursing staff  Author: Alford Highland,  MD 08/18/2023 1:09 PM  For on call review [email or web address].

## 2023-08-18 NOTE — Progress Notes (Signed)
       CROSS COVER NOTE  NAME: Joshua Berry MRN: [id number] DOB : [birth year]    Concern as stated by nurse / staff   this patient has NASH cirrhosis and was admitted bc of hepatic encephalopathy. he has been getting lactulose but has been refusing doses the last few days. he is asking to take half of the ordered dose. can the doses be adjusted      Pertinent findings on chart review:   Assessment and  Interventions   Assessment:  Hepatic encephalopathy  Plan: Lactulose dose decreased to 15 g 4 times daily for patient tolerance.  He does not want enema and will not take the higher dose X X

## 2023-08-19 ENCOUNTER — Inpatient Hospital Stay: Payer: No Typology Code available for payment source

## 2023-08-19 LAB — AMMONIA: Ammonia: 34 umol/L (ref 9–35)

## 2023-08-19 LAB — CBC
HCT: 30.2 % — ABNORMAL LOW (ref 39.0–52.0)
Hemoglobin: 10.6 g/dL — ABNORMAL LOW (ref 13.0–17.0)
MCH: 33 pg (ref 26.0–34.0)
MCHC: 35.1 g/dL (ref 30.0–36.0)
MCV: 94.1 fL (ref 80.0–100.0)
Platelets: 56 10*3/uL — ABNORMAL LOW (ref 150–400)
RBC: 3.21 MIL/uL — ABNORMAL LOW (ref 4.22–5.81)
RDW: 15.1 % (ref 11.5–15.5)
WBC: 2.7 10*3/uL — ABNORMAL LOW (ref 4.0–10.5)
nRBC: 0 % (ref 0.0–0.2)

## 2023-08-19 LAB — GLUCOSE, CAPILLARY
Glucose-Capillary: 113 mg/dL — ABNORMAL HIGH (ref 70–99)
Glucose-Capillary: 128 mg/dL — ABNORMAL HIGH (ref 70–99)
Glucose-Capillary: 130 mg/dL — ABNORMAL HIGH (ref 70–99)
Glucose-Capillary: 114 mg/dL — ABNORMAL HIGH (ref 70–99)

## 2023-08-19 LAB — BASIC METABOLIC PANEL
Anion gap: 7 (ref 5–15)
BUN: 11 mg/dL (ref 8–23)
CO2: 25 mmol/L (ref 22–32)
Calcium: 8.6 mg/dL — ABNORMAL LOW (ref 8.9–10.3)
Chloride: 100 mmol/L (ref 98–111)
Creatinine, Ser: 0.82 mg/dL (ref 0.61–1.24)
GFR, Estimated: >60 mL/min (ref >60)
Glucose, Bld: 162 mg/dL — ABNORMAL HIGH (ref 70–99)
Potassium: 4 mmol/L (ref 3.5–5.1)
Sodium: 132 mmol/L — ABNORMAL LOW (ref 135–145)

## 2023-08-19 MED ORDER — INSULIN GLARGINE 100 UNIT/ML ~~LOC~~ SOLN: 10.0000 [IU] | Freq: Every day | SUBCUTANEOUS | Status: DC

## 2023-08-19 MED ORDER — INSULIN ASPART 100 UNIT/ML IJ SOLN: 5.0000 [IU] | Freq: Three times a day (TID) | INTRAMUSCULAR | Status: DC

## 2023-08-19 MED ORDER — AMPHETAMINE-DEXTROAMPHETAMINE 10 MG PO TABS
5.0000 mg | ORAL_TABLET | Freq: Every day | ORAL | Status: DC
  Administered 2023-08-19 – 2023-08-20 (×2): 5 mg via ORAL
  Filled 2023-08-19 (×2): qty 1

## 2023-08-19 MED ORDER — AMPHETAMINE-DEXTROAMPHETAMINE 5 MG PO TABS: 5.0000 mg | ORAL_TABLET | Freq: Every day | ORAL | 0 refills | Status: DC

## 2023-08-19 MED ORDER — LACTULOSE 10 GM/15ML PO SOLN: 10.0000 g | Freq: Three times a day (TID) | ORAL | Status: DC

## 2023-08-19 MED ORDER — LEVETIRACETAM 250 MG PO TABS
250.0000 mg | ORAL_TABLET | Freq: Two times a day (BID) | ORAL | Status: DC
  Administered 2023-08-19 – 2023-08-20 (×2): 250 mg via ORAL
  Filled 2023-08-19 (×2): qty 1

## 2023-08-19 MED ORDER — MAGNESIUM OXIDE -MG SUPPLEMENT 400 (240 MG) MG PO TABS: 400.0000 mg | ORAL_TABLET | Freq: Every day | ORAL | Status: DC

## 2023-08-19 MED ORDER — BUTALBITAL-APAP-CAFFEINE 50-325-40 MG PO TABS: 1.0000 | ORAL_TABLET | Freq: Three times a day (TID) | ORAL | 0 refills | Status: DC | PRN

## 2023-08-19 NOTE — Progress Notes (Signed)
Occupational Therapy Treatment Patient Details Name: Joshua Berry MRN: [id number] DOB: 1946/04/06 Today's Date: 08/19/2023   History of present illness Joshua Berry is a 77 y.o. male with medical history significant of NASH, liver cirrhosis, hepatic encephalopathy, esophageal varices, GI bleeding, anemia, thrombocytopenia, HTN, HLD, DM, dCHF, hypothyroidism, OSA not on CPAP, melanoma, s/p of TAVR, recent admission due to subdural hematoma, MRSE bacteremia, who presents with altered mental status. Recent admission 2 weeks ago and was transferred to [facility] and then SNF.   OT comments  Pt seen for OT tx and co-tx with PT to optimize ADL mobility safety. Pt requires intermittent VC and environmental supports to improve alertness (lights on, upright positioning, blankets removed) with pt demonstrating improved orientation this date, able to follow simple commands with time and multimodal cues. Pt required MAX A +2 for rolling into side lying and TOTAL A +2 for safety for side lying to sitting EOB. Pt initially requiring MAX A for static sitting balance, improving to intermittent SBA - MAX A for balance. Pt demonstrating progress towards goals and continues to benefit from skilled OT services.       If plan is discharge home, recommend the following:  Two people to help with walking and/or transfers;Two people to help with bathing/dressing/bathroom;Help with stairs or ramp for entrance   Equipment Recommendations  Other (comment) (defer to next venue)    Recommendations for Other Services      Precautions / Restrictions Precautions Precautions: Fall Restrictions Weight Bearing Restrictions: No       Mobility Bed Mobility Overal bed mobility: Needs Assistance Bed Mobility: Sidelying to Sit, Rolling Rolling: Max assist, +2 for physical assistance Sidelying to sit: Total assist, +2 for safety/equipment       General bed mobility comments: multimodal cues for sequencing and initiation     Transfers                         Balance Overall balance assessment: Needs assistance Sitting-balance support: Feet supported, Feet unsupported, Bilateral upper extremity supported Sitting balance-Leahy Scale: Poor Sitting balance - Comments: requires initial MAX A for static sitting balance, improving to varying SBA-MAX A to maintain balance, improving after dynamic reaching Postural control: Posterior lean, Right lateral lean                                 ADL either performed or assessed with clinical judgement   ADL                                              Extremity/Trunk Assessment              Vision       Perception     Praxis      Cognition Arousal: Lethargic Behavior During Therapy: Flat affect Overall Cognitive Status: Impaired/Different from baseline Area of Impairment: Attention, Following commands, Problem solving, Awareness, Orientation, Memory                 Orientation Level: Disoriented to, Situation Current Attention Level: Focused Memory: Decreased short-term memory Following Commands: Follows one step commands with increased time   Awareness: Emergent Problem Solving: Slow processing, Decreased initiation, Difficulty sequencing, Requires verbal cues, Requires tactile cues  Exercises      Shoulder Instructions       General Comments      Pertinent Vitals/ Pain       Pain Assessment Pain Assessment: Faces Faces Pain Scale: Hurts a little bit Pain Location: bilat knee stiffness Pain Intervention(s): Monitored during session, Repositioned  Home Living                                          Prior Functioning/Environment              Frequency  Min 1X/week        Progress Toward Goals  OT Goals(current goals can now be found in the care plan section)  Progress towards OT goals: Progressing toward goals  Acute Rehab OT Goals Patient  Stated Goal: pt unable to participate OT Goal Formulation: With patient/family Time For Goal Achievement: 08/29/23 Potential to Achieve Goals: Good  Plan      Co-evaluation    PT/OT/SLP Co-Evaluation/Treatment: Yes Reason for Co-Treatment: For patient/therapist safety;To address functional/ADL transfers PT goals addressed during session: Mobility/safety with mobility;Balance OT goals addressed during session: ADL's and self-care      AM-PAC OT "6 Clicks" Daily Activity     Outcome Measure   Help from another person eating meals?: A Lot Help from another person taking care of personal grooming?: Total Help from another person toileting, which includes using toliet, bedpan, or urinal?: Total Help from another person bathing (including washing, rinsing, drying)?: Total Help from another person to put on and taking off regular upper body clothing?: Total Help from another person to put on and taking off regular lower body clothing?: Total 6 Click Score: 7    End of Session    OT Visit Diagnosis: Other abnormalities of gait and mobility (R26.89);Muscle weakness (generalized) (M62.81)   Activity Tolerance Patient tolerated treatment well   Patient Left in bed;with call bell/phone within reach;with bed alarm set   Nurse Communication          Time: 1610-9604 OT Time Calculation (min): 28 min  Charges: OT General Charges $OT Visit: 1 Visit OT Treatments $Therapeutic Activity: 8-22 mins  Arman Filter., MPH, MS, OTR/L ascom [phone] 08/19/23, 10:45 AM

## 2023-08-19 NOTE — Progress Notes (Addendum)
Physical Therapy Treatment Patient Details Name: Joshua Berry MRN: [id number] DOB: 1946/09/01 Today's Date: 08/19/2023   History of Present Illness Joshua Berry is a 77 y.o. male with medical history significant of NASH, liver cirrhosis, hepatic encephalopathy, esophageal varices, GI bleeding, anemia, thrombocytopenia, HTN, HLD, DM, dCHF, hypothyroidism, OSA not on CPAP, melanoma, s/p of TAVR, recent admission due to subdural hematoma, MRSE bacteremia, who presents with altered mental status. Recent admission 2 weeks ago and was transferred to [facility] and then SNF.    PT Comments  PT/OT cotreat performed for patient/ therapist safety and to maximize functional abilities. Pt presents laying in bed, A&Ox3 and disoriented to situation with some complaints of pain in low back/knees. PT assisted with b/l knee AAROM (min-modA from therapist) 5x ea to assist with stiffness and maximize functional mobility during session. Pt performed rolling maxAx2, sidelying>sit totalAx2, and sit>supine totalAx2. Overall sitting balance initially required maxA for trunk control with intermittent SBA/CGA over time. Pt exhibited a prominent posterior/right lean in sitting that was briefly corrected with reaching to the left.   Throughout session, pt required frequent multimodal cueing for initiating and sequencing mobility. He exhibited improved alertness at the start of session but lethargy increased with fatigue. Pt would benefit from continued skilled therapy to maximize functional abilities.    If plan is discharge home, recommend the following: Two people to help with walking and/or transfers;Two people to help with bathing/dressing/bathroom;Assistance with cooking/housework;Direct supervision/assist for medications management;Help with stairs or ramp for entrance;Assist for transportation;Direct supervision/assist for financial management;Supervision due to cognitive status;Assistance with feeding   Can travel by private  vehicle     No  Equipment Recommendations  None recommended by PT    Recommendations for Other Services       Precautions / Restrictions Precautions Precautions: Fall Restrictions Weight Bearing Restrictions: No     Mobility  Bed Mobility Overal bed mobility: Needs Assistance Bed Mobility: Sidelying to Sit, Rolling, Sit to Supine Rolling: Max assist, +2 for physical assistance Sidelying to sit: Total assist, +2 for safety/equipment   Sit to supine: Total assist, +2 for physical assistance   General bed mobility comments: multimodal cues for sequencing and initiation    Transfers                   General transfer comment: unable at this time    Ambulation/Gait               General Gait Details: unable at this time   Stairs             Wheelchair Mobility     Tilt Bed    Modified Rankin (Stroke Patients Only)       Balance Overall balance assessment: Needs assistance Sitting-balance support: Feet supported, Feet unsupported, Bilateral upper extremity supported Sitting balance-Leahy Scale: Poor Sitting balance - Comments: overall MaxA with intermittent supervision/CGA, sitting balance improved over time but limited by fatigue Postural control: Posterior lean, Right lateral lean                                  Cognition Arousal: Lethargic Behavior During Therapy: Flat affect Overall Cognitive Status: Impaired/Different from baseline Area of Impairment: Orientation, Following commands, Problem solving                 Orientation Level: Disoriented to, Situation     Following Commands: Follows one step commands with increased time,  Follows multi-step commands inconsistently     Problem Solving: Slow processing, Difficulty sequencing, Requires verbal cues, Requires tactile cues, Decreased initiation          Exercises General Exercises - Lower Extremity Heel Slides: AAROM, Both, 5 reps, Supine (min-modA  from therapist)    General Comments        Pertinent Vitals/Pain Pain Assessment Pain Assessment: Faces Faces Pain Scale: Hurts a little bit Pain Location: b/l knee/ low back Pain Descriptors / Indicators: Other (Comment), Discomfort (stiffness) Pain Intervention(s): Repositioned, Monitored during session    Home Living                          Prior Function            PT Goals (current goals can now be found in the care plan section) Acute Rehab PT Goals Patient Stated Goal: return to SNF Progress towards PT goals: Progressing toward goals    Frequency    Min 1X/week      PT Plan      Co-evaluation PT/OT/SLP Co-Evaluation/Treatment: Yes Reason for Co-Treatment: For patient/therapist safety;To address functional/ADL transfers PT goals addressed during session: Mobility/safety with mobility;Balance OT goals addressed during session: ADL's and self-care      AM-PAC PT "6 Clicks" Mobility   Outcome Measure  Help needed turning from your back to your side while in a flat bed without using bedrails?: A Lot Help needed moving from lying on your back to sitting on the side of a flat bed without using bedrails?: A Lot Help needed moving to and from a bed to a chair (including a wheelchair)?: Total Help needed standing up from a chair using your arms (e.g., wheelchair or bedside chair)?: Total Help needed to walk in hospital room?: Total Help needed climbing 3-5 steps with a railing? : Total 6 Click Score: 8    End of Session   Activity Tolerance: Patient limited by pain;Other (comment);Patient limited by lethargy (Limited by Attention) Patient left: in bed;with call bell/phone within reach;with bed alarm set   PT Visit Diagnosis: Other abnormalities of gait and mobility (R26.89);Pain Pain - Right/Left:  (B/L) Pain - part of body:  (all joints/ extremities)     Time: 0940-1009 PT Time Calculation (min) (ACUTE ONLY): 29 min  Charges:     $Therapeutic Activity: 8-22 mins PT General Charges $$ ACUTE PT VISIT: 1 Visit                    Joshua Berry, PT, SPT 12:00 PM,08/19/23

## 2023-08-19 NOTE — Plan of Care (Signed)
  Problem: Education: Goal: Knowledge of General Education information will improve Description: Including pain rating scale, medication(s)/side effects and non-pharmacologic comfort measures Outcome: Progressing   Problem: Clinical Measurements: Goal: Respiratory complications will improve Outcome: Progressing   Problem: Clinical Measurements: Goal: Cardiovascular complication will be avoided Outcome: Progressing   Problem: Elimination: Goal: Will not experience complications related to urinary retention Outcome: Progressing   Problem: Pain Managment: Goal: General experience of comfort will improve Outcome: Progressing   Problem: Safety: Goal: Ability to remain free from injury will improve Outcome: Progressing   

## 2023-08-19 NOTE — Progress Notes (Signed)
Progress Note   [name] ZOX:[id number] DOB: 1946-06-14 DOA: 08/12/2023     7 DOS: the patient was seen and examined on 08/19/2023   Brief hospital course: 77 y.o. male with medical history significant of NASH, liver cirrhosis, hepatic encephalopathy, esophageal varices, GI bleeding, anemia, thrombocytopenia, HTN, HLD, DM, dCHF, hypothyroidism, OSA not on CPAP, melanoma, s/p of TAVR, recent admission due to subdural hematoma, MRSE bacteremia, who presents with altered mental status.   Patient was recently hospitalized from 7/29 - 8/16 due to subdural hematoma with uncal hernia.  Patient was s/p of s/p bilateral frontal burr hole washout procedure on 7/29, and then transferred to [facility] where pt had MMA embolization with NIR 8/19, then d/c'd to rehab facility on Friday (8/23). Per discharge summary, pt also had MRSE bacteremia. "Since pt had a subdural drain that had drained spinal fluid, there is concern for noscomial meningitis. Since has high risk for doing LP, decision was made to start tx for meningitis empirically. 8/15 as per ID patient may need vancomycin for 2 to 4 weeks".  Per his daughter, for reason vancomycin was not continued after patient was transferred to [facility].   Per his daughter, patient was alert and orientated x 3 when patient was transferred to rehab facility on 8/23, but becomes confused in the past several days.  When I saw patient in ED, patient is obviously confused and lethargic.  He is arousable and still orientated x 3 when aroused.  He moves all extremities.  No facial droop or slurred speech.  Patient does not have chest pain, cough or SOB.  No nausea, vomiting or abdominal pain.  Patient has loose stool which his daughter attributes to lactulose use.  No symptoms of UTI.  8/28.  Neurosurgery did not want to do any invasive procedure at this time.  Patient started on IV vancomycin for previous Staph epidermidis in blood cultures.  Patient's family interested in  starting a stimulant so he can be up and awake and eat. 8/29.  Patient more alert today.  Very stiff.  As per patient's wife ate better last night. 8/30.  Patient more alert today than yesterday.  Ate a better breakfast this morning. 8/31.  Patient holding more of a conversation today than yesterday.  Eating more. 9/1.  Patient able to straight leg raise on his own today.   9/2.  Patient asking more questions today. 9/3.  Family concerned that his mental status was not as good today as previously.  Repeat laboratory data showed a sodium of 132 creatinine 0.82, ammonia 34, white blood count 2.7, hemoglobin 10.6 and platelet count of 56.  CT scan of the head reviewed with neurosurgery and recommended conservative management.  He did not think this was a new blood.  Patient this morning having trouble completing full thoughts.  This afternoon doing better with that.  Restarted Keppra.  Assessment and Plan: * Acute metabolic encephalopathy Could be secondary to partially treated Staph epidermidis infection.  Vancomycin completed yesterday since blood cultures negative for 5 days.  Patient's wife thinks that he improved after starting Adderall.  I will continue this upon discharge.  Patient needs lactulose and xifaxan.  Patients mental status has improved during the hospital course.  Family concerned that mental status not as good today as yesterday.  CT scan of the head done but pending official read.  Laboratory data would not explain this.  Will restart Keppra.  Staphylococcus epidermidis bacteremia Previous hospitalization ID recommended 2 to 4  weeks of IV vancomycin but this was not continued after discharge from [facility].  Empirically on vancomycin and repeat blood cultures so far negative for 5 days.  Discontinued vancomycin.  Subdural hematoma (HCC) Bilateral subdural hematomas and uncal herniation.  Status post bilateral frontal burr hole and then had an embolization procedure.  Restart  Keppra.  Cirrhosis of liver without ascites (HCC) Pancytopenia and elevated liver function test secondary to cirrhosis.  Continue Xifaxan and lactulose.  Today's ammonia level 34.  Acute hepatic encephalopathy (HCC) Continue Xifaxan and lactulose.  Pancytopenia (HCC) Platelet count 56, hemoglobin 10.6 and white cell count 2.7  Chronic diastolic CHF (congestive heart failure) (HCC) No signs of heart failure.  Last EF 60%.  HLD (hyperlipidemia) On Lipitor.  CK normal range  GERD without esophagitis On Protonix  Controlled IDDM-2 with hyperglycemia On Semglee insulin and sliding scale  Hypothyroidism On levothyroxine, last TSH normal range.  Right wrist pain X-ray shows arthritis.  No fracture.  Patient is very stiff all over his body.   Decubitus ulcer of coccyx, stage I Also on buttock.  See full description below, present on admission.  Malnutrition of moderate degree Continue supplements        Subjective: Patient this morning was having some difficulty finishing his thoughts.  He was able to follow commands and do straight leg raise and lift up his arms.  Repeat CT scan of the head and laboratory data done.  Physical Exam: Vitals:   08/19/23 0410 08/19/23 0757 08/19/23 1235 08/19/23 1608  BP:  120/61 119/64   Pulse:      Resp:      Temp: 98.2 F (36.8 C) 98.1 F (36.7 C) 98.2 F (36.8 C) 98.1 F (36.7 C)  TempSrc:  Oral Oral Oral  SpO2:      Weight:      Height:       Physical Exam HENT:     Head: Normocephalic.     Mouth/Throat:     Pharynx: No oropharyngeal exudate.  Eyes:     General: Lids are normal.     Conjunctiva/sclera: Conjunctivae normal.  Cardiovascular:     Rate and Rhythm: Normal rate and regular rhythm.     Heart sounds: Normal heart sounds, S1 normal and S2 normal.  Pulmonary:     Breath sounds: Examination of the right-lower field reveals decreased breath sounds. Examination of the left-lower field reveals decreased breath  sounds. Decreased breath sounds present. No wheezing, rhonchi or rales.  Abdominal:     Palpations: Abdomen is soft.     Tenderness: There is no abdominal tenderness.  Musculoskeletal:     Right lower leg: Swelling present.     Left lower leg: Swelling present.  Skin:    General: Skin is warm.     Findings: No rash.  Neurological:     Mental Status: He is alert.     Comments: Patient able to straight leg raise and lift arms up off the bed.  This morning was having trouble finishing some of his thoughts.  This afternoon was doing a little bit better with that.     Data Reviewed: CT scan showing stable size of mixed density bilateral subdural hematomas measuring 1.2 cm on the right and 0.9 cm on the left.  Unchanged 6 to 7 mm leftward midline shift.  Small amount of hyperdensity on the right may reflect redistribution or small amount of new acute blood.  CT scan reviewed with neurosurgery and they do not think  that this is new blood thinks it is redistribution. White blood cell count 2.7, hemoglobin 10.6, platelet count 56, creatinine 0.82, sodium 132  Family Communication: Spoke with patient's wife on the phone this morning and again this afternoon.  Disposition: Status is: Inpatient Remains inpatient appropriate because: Will watch overnight and reassess for disposition tomorrow  Planned Discharge Destination: Rehab    Time spent: 45 minutes  Author: Alford Highland, MD 08/19/2023 5:34 PM  For on call review [email or web address].

## 2023-08-19 NOTE — Progress Notes (Signed)
Nutrition Follow-up  DOCUMENTATION CODES:   Non-severe (moderate) malnutrition in context of chronic illness  INTERVENTION:   -Continue Ensure Enlive po TID, each supplement provides 350 kcal and 20 grams of protein -Continue Magic cup TID with meals, each supplement provides 290 kcal and 9 grams of protein  -Continue MVI with minerals daily  NUTRITION DIAGNOSIS:   Moderate Malnutrition related to chronic illness (cirrhosis) as evidenced by mild fat depletion, moderate fat depletion, mild muscle depletion, moderate muscle depletion, percent weight loss.  Ongoing  GOAL:   Patient will meet greater than or equal to 90% of their needs  Progressing   MONITOR:   PO intake, Supplement acceptance, Diet advancement  REASON FOR ASSESSMENT:   Malnutrition Screening Tool    ASSESSMENT:   Pt with medical history significant of NASH, liver cirrhosis, hepatic encephalopathy, esophageal varices, GI bleeding, anemia, thrombocytopenia, HTN, HLD, DM, dCHF, hypothyroidism, OSA not on CPAP, melanoma, s/p of TAVR, recent admission due to subdural hematoma, MRSE bacteremia, who presents with altered mental status.  8/26- s/p BSE- dysphagia 2 diet with thin liquids  Reviewed I/O's: -1.2 L x 24 hours and -3 L since admission  UOP: 1.2 L x 24 hours   Lactulose dose decreased yesterday by MD due to pt refusing doses.   Pt unavailable at time of visit.   Pt with poor oral intake. Noted meal completions 0-50%.   Per MD, pt to d/c lack to SNF for short term rehab today.   Medications reviewed and include addreall, vitamin D3, lactulose, and lovasa.  Labs reviewed: Na: 132, CBGS: 97-189 (inpatient orders for glycemic control are 0-5 units insulin aspart daily at bedtime, 0-9 units insulin aspart TID with meals, and 10 units insulin glargine-yfgn dauly at bedtime).    Diet Order:   Diet Order             DIET DYS 2 Room service appropriate? Yes with Assist; Fluid consistency: Thin  Diet  effective now                   EDUCATION NEEDS:   No education needs have been identified at this time  Skin:  Skin Assessment: Skin Integrity Issues: Skin Integrity Issues:: Stage I, Stage II Stage I: rt and lt buttocks Stage II: coccyx with early/ partial granulation Incisions: -  Last BM:  08/18/23 (type 6)  Height:   Ht Readings from Last 1 Encounters:  08/12/23 6\' 2"  (1.88 m)    Weight:   Wt Readings from Last 1 Encounters:  08/13/23 95.3 kg    Ideal Body Weight:  86.4 kg  BMI:  Body mass index is 26.98 kg/m.  Estimated Nutritional Needs:   Kcal:  2400-2600  Protein:  115-130 grams  Fluid:  > 2 L    Levada Schilling, RD, LDN, CDCES Registered Dietitian II Certified Diabetes Care and Education Specialist Please refer to [facility] for RD and/or RD on-call/weekend/after hours pager

## 2023-08-19 NOTE — Plan of Care (Signed)

## 2023-08-19 NOTE — TOC Progression Note (Signed)
Transition of Care [facility]) - Progression Note    Patient Details  Name: Joshua Berry MRN: [id number] Date of Birth: [birth year]  Transition [facility]) CM/SW Contact  Truddie Hidden, RN Phone Number: 08/19/2023, 3:09 PM  Clinical Narrative:    Discharge cancelled. Per primary nurse EMS was cancelled [facility] spoke with Tiffany in admissions at [facility] to notify of discharge.    Expected Discharge Plan: Skilled Nursing Facility Barriers to Discharge: Barriers Resolved  Expected Discharge Plan and Services         Expected Discharge Date: 08/19/23                                     Social Determinants of Health (SDOH) Interventions SDOH Screenings   Food Insecurity: No Food Insecurity (08/13/2023)  Housing: Low Risk  (08/13/2023)  Transportation Needs: No Transportation Needs (08/13/2023)  Recent Concern: Transportation Needs - Unmet Transportation Needs (06/17/2023)  Utilities: Not At Risk (08/13/2023)  Recent Concern: Utilities - At Risk (06/17/2023)  Depression (PHQ2-9): Low Risk  (04/24/2023)  Financial Resource Strain: Low Risk  (08/06/2023)   Received from [facility]  Social Connections: Unknown (04/29/2022)   Received from [facility]  Tobacco Use: Medium Risk (08/12/2023)    Readmission Risk Interventions    08/14/2023    2:18 PM 07/15/2023    8:52 AM 06/18/2023   10:19 AM  Readmission Risk Prevention Plan  Transportation Screening Complete Complete Complete  PCP or Specialist Appt within 5-7 Days   Complete  PCP or Specialist Appt within 3-5 Days Complete Complete   Home Care Screening   Complete  Medication Review (RN CM)   Complete  HRI or Home Care Consult Complete    Social Work Consult for Recovery Care Planning/Counseling Complete Complete   Palliative Care Screening Not Applicable Not Applicable   Medication Review Oceanographer) Complete Referral to Pharmacy

## 2023-08-19 NOTE — Discharge Summary (Addendum)
Physician Discharge Summary   [name] MRN: [id number] DOB: 06/09/1946  Admit date:     08/12/2023  Discharge date: 08/19/23  Discharge Physician: Alford Highland   PCP: [facility]   Recommendations at discharge:    Follow up team at rehab one day  Discharge Diagnoses: Principal Problem:   Acute metabolic encephalopathy Active Problems:   Staphylococcus epidermidis bacteremia   Subdural hematoma (HCC)   Uncal herniation (HCC)   Cirrhosis of liver without ascites (HCC)   Acute hepatic encephalopathy (HCC)   Pancytopenia (HCC)   Chronic diastolic CHF (congestive heart failure) (HCC)   HLD (hyperlipidemia)   GERD without esophagitis   Controlled IDDM-2 with hyperglycemia   Hypothyroidism   Bilateral subdural hematomas (HCC)   Malnutrition of moderate degree   Decubitus ulcer of coccyx, stage I   Right wrist pain    Hospital Course: 77 y.o. male with medical history significant of NASH, liver cirrhosis, hepatic encephalopathy, esophageal varices, GI bleeding, anemia, thrombocytopenia, HTN, HLD, DM, dCHF, hypothyroidism, OSA not on CPAP, melanoma, s/p of TAVR, recent admission due to subdural hematoma, MRSE bacteremia, who presents with altered mental status.   Patient was recently hospitalized from 7/29 - 8/16 due to subdural hematoma with uncal hernia.  Patient was s/p of s/p bilateral frontal burr hole washout procedure on 7/29, and then transferred to [facility] where pt had MMA embolization with NIR 8/19, then d/c'd to rehab facility on Friday (8/23). Per discharge summary, pt also had MRSE bacteremia. "Since pt had a subdural drain that had drained spinal fluid, there is concern for noscomial meningitis. Since has high risk for doing LP, decision was made to start tx for meningitis empirically. 8/15 as per ID patient may need vancomycin for 2 to 4 weeks".  Per his daughter, for reason vancomycin was not continued after patient was transferred to [facility].   Per his  daughter, patient was alert and orientated x 3 when patient was transferred to rehab facility on 8/23, but becomes confused in the past several days.  When I saw patient in ED, patient is obviously confused and lethargic.  He is arousable and still orientated x 3 when aroused.  He moves all extremities.  No facial droop or slurred speech.  Patient does not have chest pain, cough or SOB.  No nausea, vomiting or abdominal pain.  Patient has loose stool which his daughter attributes to lactulose use.  No symptoms of UTI.  8/28.  Neurosurgery did not want to do any invasive procedure at this time.  Patient started on IV vancomycin for previous Staph epidermidis in blood cultures.  Patient's family interested in starting a stimulant so he can be up and awake and eat. 8/29.  Patient more alert today.  Very stiff.  As per patient's wife ate better last night. 8/30.  Patient more alert today than yesterday.  Ate a better breakfast this morning. 8/31.  Patient holding more of a conversation today than yesterday.  Eating more. 9/1.  Patient able to straight leg raise on his own today.   9/2.  Patient asking more questions today. 9/3.  Family concerned that his mental status was not as good today as previously.  Repeat laboratory data showed a sodium of 132 creatinine 0.82, ammonia 34, white blood count 2.7, hemoglobin 10.6 and platelet count of 56.  CT scan of the head done but still pending official read.  Patient this morning having trouble completing full thoughts.  This afternoon doing better with  that.  Will restart Keppra.  Assessment and Plan: * Acute metabolic encephalopathy Could be secondary to partially treated Staph epidermidis infection.  Vancomycin completed yesterday since blood cultures negative for 5 days.  Patient's wife thinks that he improved after starting Adderall.  I will continue this upon discharge.  Patient needs lactulose and xifaxan.  Patients mental status has improved during the  hospital course.  Family concerned that mental status not as good today as yesterday.  CT scan of the head done but pending official read.  Laboratory data would not explain this.  Will restart Keppra.  Staphylococcus epidermidis bacteremia Previous hospitalization ID recommended 2 to 4 weeks of IV vancomycin but this was not continued after discharge from [facility]  Empirically on vancomycin and repeat blood cultures so far negative for 5 days.  Discontinued vancomycin.  Subdural hematoma (HCC) Bilateral subdural hematomas and uncal herniation.  Status post bilateral frontal burr hole and then had an embolization procedure.  Restart Keppra.  Cirrhosis of liver without ascites (HCC) Pancytopenia and elevated liver function test secondary to cirrhosis.  Continue Xifaxan and lactulose.  Today's ammonia level 34.  Acute hepatic encephalopathy (HCC) Continue Xifaxan and lactulose.  Pancytopenia (HCC) Platelet count 56, hemoglobin 10.6 and white cell count 2.7  Chronic diastolic CHF (congestive heart failure) (HCC) No signs of heart failure.  Last EF 60%.  HLD (hyperlipidemia) On Lipitor.  CK normal range  GERD without esophagitis On Protonix  Controlled IDDM-2 with hyperglycemia On Semglee insulin and sliding scale  Hypothyroidism On levothyroxine, last TSH normal range.  Right wrist pain X-ray shows arthritis.  No fracture.  Patient is very stiff all over his body.   Decubitus ulcer of coccyx, stage I Also on buttock.  See full description below, present on admission.  Malnutrition of moderate degree Continue supplements         Consultants: neurosurgery Procedures performed: none Disposition: Rehabilitation facility Diet recommendation:  Dysphagia 2 diet with thin liquids DISCHARGE MEDICATION: Allergies as of 08/19/2023       Reactions   Camphor Swelling, Rash   Latex Rash   Lisinopril Cough   Other Reaction(s): Cough   Sulfamethoxazole Swelling   Chocolate     Family states he is allergic   Nsaids Other (See Comments)   NASH        Medication List     STOP taking these medications    ascorbic acid 500 MG tablet Commonly known as: VITAMIN C   Aspercreme Lidocaine 4 % Liqd Generic drug: Lidocaine HCl   augmented betamethasone dipropionate 0.05 % cream Commonly known as: DIPROLENE-AF   bisacodyl 5 MG EC tablet Commonly known as: DULCOLAX   folic acid 1 MG tablet Commonly known as: FOLVITE   magnesium oxide 400 MG tablet Commonly known as: MAG-OX   Semaglutide(0.25 or 0.5MG /DOS) 2 MG/1.5ML Sopn   vancomycin HCl 1500 MG/300ML Soln Commonly known as: VANCOREADY       TAKE these medications    acetaminophen 500 MG tablet Commonly known as: TYLENOL Take 1 tablet (500 mg total) by mouth every 6 (six) hours as needed for moderate pain, fever, headache or mild pain (headache).   amphetamine-dextroamphetamine 5 MG tablet Commonly known as: ADDERALL Take 1 tablet (5 mg total) by mouth daily with breakfast.   atorvastatin 20 MG tablet Commonly known as: LIPITOR Take 20 mg by mouth at bedtime.   butalbital-acetaminophen-caffeine 50-325-40 MG tablet Commonly known as: FIORICET Take 1 tablet by mouth every 8 (eight) hours as needed  for headache. What changed: how much to take   Cholecalciferol 50 MCG (2000 UT) Tabs Take 1 tablet by mouth daily.   clotrimazole 1 % external solution Commonly known as: LOTRIMIN Apply 1 Application topically 2 (two) times daily. Apply to toe nails   famotidine 20 MG tablet Commonly known as: PEPCID Take 20 mg by mouth daily.   feeding supplement Liqd Take 237 mLs by mouth 3 (three) times daily between meals.   Fish Oil 1000 MG Caps Take 1 capsule by mouth 2 (two) times daily.   furosemide 20 MG tablet Commonly known as: LASIX Take 1 tablet (20 mg total) by mouth daily. What changed: additional instructions   insulin aspart 100 UNIT/ML injection Commonly known as: novoLOG Inject 5  Units into the skin 3 (three) times daily before meals. What changed: how much to take   insulin glargine 100 UNIT/ML injection Commonly known as: LANTUS Inject 0.1 mLs (10 Units total) into the skin at bedtime. What changed:  how much to take when to take this   ketotifen 0.025 % ophthalmic solution Commonly known as: ZADITOR Place 1 drop into both eyes 2 (two) times daily.   lactulose 10 GM/15ML solution Commonly known as: CHRONULAC Take 15 mLs (10 g total) by mouth 3 (three) times daily. What changed: how much to take   levETIRAcetam 250 MG tablet Commonly known as: KEPPRA Take 1 tablet (250 mg total) by mouth 2 (two) times daily.   levothyroxine 100 MCG tablet Commonly known as: SYNTHROID Take 100 mcg by mouth daily before breakfast.   lidocaine 5 % Commonly known as: LIDODERM Place 2 patches onto the skin daily. Remove & Discard patch within 12 hours or as directed by MD   magnesium oxide 400 (240 Mg) MG tablet Commonly known as: MAG-OX Take 1 tablet (400 mg total) by mouth daily. What changed: when to take this   multivitamin with minerals Tabs tablet Take 1 tablet by mouth daily.   pantoprazole 40 MG tablet Commonly known as: Protonix Take 1 tablet (40 mg total) by mouth 2 (two) times daily.   RA Probiotic Digestive Care Caps Take 1 capsule by mouth daily.   rifaximin 550 MG Tabs tablet Commonly known as: XIFAXAN Take 550 mg by mouth 2 (two) times daily. Takes with lactulose   terazosin 5 MG capsule Commonly known as: HYTRIN Take 5 mg by mouth at bedtime.   trolamine salicylate 10 % cream Commonly known as: ASPERCREME Apply topically as needed for muscle pain.   zinc oxide 20 % ointment Apply 1 Application topically in the morning and at bedtime. APPLY SMALL AMOUNT TO AFFECTED AREA TWICE A DAY FOR BUTTOCK RASH FOR BUTTOCK RASH        Contact information for follow-up providers     [facility] Follow up in 1 day(s).          Venetia Night, MD Follow up in 3 week(s).   Specialty: Neurosurgery Why: Please call the office to make this appointment. Thanks! Contact information: [address] [place] [address] [phone]              Contact information for after-discharge care     Destination     [facility] Preferred SNF .   Service: Skilled Paramedic information: [address] [place] [address] [phone]  Discharge Exam: Filed Weights   08/12/23 1639 08/12/23 1700 08/13/23 0500  Weight: 107 kg 90.8 kg 95.3 kg   Physical Exam HENT:     Head: Normocephalic.     Mouth/Throat:     Pharynx: No oropharyngeal exudate.  Eyes:     General: Lids are normal.     Conjunctiva/sclera: Conjunctivae normal.  Cardiovascular:     Rate and Rhythm: Normal rate and regular rhythm.     Heart sounds: Normal heart sounds, S1 normal and S2 normal.  Pulmonary:     Breath sounds: No decreased breath sounds, wheezing, rhonchi or rales.  Abdominal:     Palpations: Abdomen is soft.     Tenderness: There is no abdominal tenderness.  Musculoskeletal:     Right lower leg: Swelling present.     Left lower leg: Swelling present.  Skin:    General: Skin is warm.     Findings: No rash.  Neurological:     Mental Status: He is alert.     Comments: Patient able to ask more questions today and inquire about his health.  Patient able to straight leg raise and lift up his arms up off the bed.      Condition at discharge: stable  The results of significant diagnostics from this hospitalization (including imaging, microbiology, ancillary and laboratory) are listed below for reference.   Imaging Studies: DG Wrist 2 Views Right  Result Date: 08/14/2023 CLINICAL DATA:  Right wrist pain for 2 days.  No known injury. EXAM: RIGHT WRIST - 2 VIEW COMPARISON:  None  Available. FINDINGS: Frontal and lateral views of the right wrist. 1.5 mm ulnar negative variance. Moderate to severe thumb carpometacarpal joint space narrowing, subchondral sclerosis, and subchondral cystic change, and peripheral osteophytosis. Moderate index finger metacarpophalangeal and triscaphe joint space narrowing. No acute fracture or dislocation. Moderate atherosclerotic calcifications. IMPRESSION: 1. 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 2. Moderate index finger metacarpophalangeal and triscaphe osteoarthritis. Electronically Signed   By: Neita Garnet M.D.   On: 08/14/2023 12:26   CT Head Wo Contrast  Result Date: 08/12/2023 CLINICAL DATA:  Mental status change of unknown cause EXAM: CT HEAD WITHOUT CONTRAST TECHNIQUE: Contiguous axial images were obtained from the base of the skull through the vertex without intravenous contrast. RADIATION DOSE REDUCTION: This exam was performed according to the departmental dose-optimization program which includes automated exposure control, adjustment of the mA and/or kV according to patient size and/or use of iterative reconstruction technique. COMPARISON:  07/28/2023 FINDINGS: Brain: No focal abnormality affects the brainstem or cerebellum. Bilateral low to intermediate density subdural collections persist, maximal thickness on the right 11 mm compared with 13 mm 2 weeks ago. Maximal thickness on the left is 6 mm, compared with 7 mm 2 weeks ago. Resolution of previously seen subdural air. Mass effect persists with right to left shift of 6 mm, unchanged. No evidence of parenchymal infarction. No hydrocephalus. Vascular: There is atherosclerotic calcification of the major vessels at the base of the brain. Skull: No acute finding.  Bilateral burr holes. Sinuses/Orbits: Clear/normal Other: None IMPRESSION: Bilateral low to intermediate density subdural collections persist, maximal thickness on the right 11 mm compared with 13 mm 2 weeks ago.  Maximal thickness on the left 6 mm, compared with 7 mm 2 weeks ago. Resolution of previously seen subdural air. Mass effect persists with right to left shift of 6 mm, unchanged. No worsening finding. Electronically Signed   By: Paulina Fusi M.D.   On: 08/12/2023 17:08  ECHOCARDIOGRAM COMPLETE  Result Date: 07/31/2023    ECHOCARDIOGRAM REPORT   Patient Name:   Joshua Berry Silas Date of Exam: 07/31/2023 Medical Rec #:  [id number]     Height:       74.0 in Accession #:    [id number]    Weight:       237.7 lb Date of Birth:  04-22-1946     BSA:          2.339 m Patient Age:    77 years      BP:           125/68 mmHg Patient Gender: M             HR:           76 bpm. Exam Location:  [facility] Procedure: 2D Echo, Cardiac Doppler, Color Doppler and Intracardiac            Opacification Agent Indications:     Post TAVR evaluation  History:         Patient has prior history of Echocardiogram examinations, most                  recent 04/09/2023. CHF, Signs/Symptoms:Chest Pain, Altered                  Mental Status and Bacteremia; Risk Factors:Hypertension,                  Diabetes and Dyslipidemia. 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  Sonographer:     Mikki Harbor Referring Phys:  [id number] Gillis Santa Diagnosing Phys: Julien Nordmann MD  Sonographer Comments: Technically difficult study due to poor echo windows and suboptimal apical window. IMPRESSIONS  1. Left ventricular ejection fraction, by estimation, is 60 to 65%. The left ventricle has normal function. The left ventricle has no regional wall motion abnormalities. There is moderate left ventricular hypertrophy. Left ventricular diastolic parameters are consistent with Grade I diastolic dysfunction (impaired relaxation).  2. Right ventricular systolic function is normal. The right ventricular size is normal. There is normal pulmonary artery systolic pressure. The estimated right ventricular systolic pressure is 34.6 mmHg.  3.  Left atrial size was mildly dilated.  4. The mitral valve is normal in structure. No evidence of mitral valve regurgitation. No evidence of mitral stenosis. Moderate mitral annular calcification.  5. The aortic valve has been repaired/replaced, bioprosthetic valve, s/p TAVR. Aortic valve regurgitation is not visualized. Aortic valve mean gradient measures 12.7 mmHg.  6. The inferior vena cava is normal in size with greater than 50% respiratory variability, suggesting right atrial pressure of 3 mmHg. FINDINGS  Left Ventricle: Left ventricular ejection fraction, by estimation, is 60 to 65%. The left ventricle has normal function. The left ventricle has no regional wall motion abnormalities. Definity contrast agent was given IV to delineate the left ventricular  endocardial borders. The left ventricular internal cavity size was normal in size. There is moderate left ventricular hypertrophy. Left ventricular diastolic parameters are consistent with Grade I diastolic dysfunction (impaired relaxation). Right Ventricle: The right ventricular size is normal. No increase in right ventricular wall thickness. Right ventricular systolic function is normal. There is normal pulmonary artery systolic pressure. The tricuspid regurgitant velocity is 2.58 m/s, and  with an assumed right atrial pressure of 8 mmHg, the estimated right ventricular systolic pressure is 34.6 mmHg. Left  Atrium: Left atrial size was mildly dilated. Right Atrium: Right atrial size was normal in size. Pericardium: There is no evidence of pericardial effusion. Mitral Valve: The mitral valve is normal in structure. Moderate mitral annular calcification. No evidence of mitral valve regurgitation. No evidence of mitral valve stenosis. MV peak gradient, 9.1 mmHg. The mean mitral valve gradient is 4.0 mmHg. Tricuspid Valve: The tricuspid valve is normal in structure. Tricuspid valve regurgitation is mild . No evidence of tricuspid stenosis. Aortic Valve: The aortic  valve has been repaired/replaced. Aortic valve regurgitation is not visualized. Aortic valve sclerosis is present, with no evidence of aortic valve stenosis. Aortic valve mean gradient measures 12.7 mmHg. Aortic valve peak gradient measures 25.1 mmHg. Aortic valve area, by VTI measures 1.40 cm. 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 Pulmonic Valve: The pulmonic valve was normal in structure. Pulmonic valve regurgitation is not visualized. No evidence of pulmonic stenosis. Aorta: The aortic root is normal in size and structure. Venous: The inferior vena cava is normal in size with greater than 50% respiratory variability, suggesting right atrial pressure of 3 mmHg. IAS/Shunts: No atrial level shunt detected by color flow Doppler.  LEFT VENTRICLE PLAX 2D LVIDd:         5.40 cm   Diastology LVIDs:         3.50 cm   LV e' medial:    7.51 cm/s LV PW:         1.30 cm   LV E/e' medial:  17.8 LV IVS:        1.50 cm   LV e' lateral:   10.90 cm/s LVOT diam:     2.00 cm   LV E/e' lateral: 12.3 LV SV:         72 LV SV Index:   31 LVOT Area:     3.14 cm  RIGHT VENTRICLE RV Basal diam:  4.90 cm RV S prime:     16.60 cm/s LEFT ATRIUM           Index        RIGHT ATRIUM           Index LA diam:      4.70 cm 2.01 cm/m   RA Area:     24.50 cm LA Vol (A4C): 63.0 ml 26.94 ml/m  RA Volume:   69.60 ml  29.76 ml/m  AORTIC VALVE                     PULMONIC VALVE AV Area (Vmax):    1.29 cm      PV Vmax:       1.47 m/s AV Area (Vmean):   1.22 cm      PV Peak grad:  8.6 mmHg AV Area (VTI):     1.40 cm AV Vmax:           250.33 cm/s AV Vmean:          160.000 cm/s AV VTI:            0.513 m AV Peak Grad:      25.1 mmHg AV Mean Grad:      12.7 mmHg LVOT Vmax:         102.50 cm/s LVOT Vmean:        62.250 cm/s LVOT VTI:          0.228 m LVOT/AV VTI ratio: 0.44  AORTA Ao Root diam: 3.60 cm MITRAL VALVE  TRICUSPID VALVE MV Area (PHT): 1.72 cm     TR Peak grad:   26.6 mmHg MV Area VTI:   1.43 cm      TR Vmax:        258.00 cm/s MV Peak grad:  9.1 mmHg MV Mean grad:  4.0 mmHg     SHUNTS MV Vmax:       1.51 m/s     Systemic VTI:  0.23 m MV Vmean:      88.8 cm/s    Systemic Diam: 2.00 cm MV Decel Time: 440 msec MV E velocity: 134.00 cm/s MV A velocity: 153.00 cm/s MV E/A ratio:  0.88 Julien Nordmann MD Electronically signed by Julien Nordmann MD Signature Date/Time: 07/31/2023/4:48:21 PM    Final    CT HEAD WO CONTRAST ( )  Result Date: 07/28/2023 CLINICAL DATA:  Mental status change, unknown cause. EXAM: CT HEAD WITHOUT CONTRAST TECHNIQUE: Contiguous axial images were obtained from the base of the skull through the vertex without intravenous contrast. RADIATION DOSE REDUCTION: This exam was performed according to the departmental dose-optimization program which includes automated exposure control, adjustment of the mA and/or kV according to patient size and/or use of iterative reconstruction technique. COMPARISON:  Head CT 07/21/2023.  Brain MRI 07/23/2023. FINDINGS: Brain: Similar size of the bilateral subdural collections compared to the recent brain MRI, accounting for differences in modality. Unchanged 6 mm of leftward midline shift. Continued decrease in pneumocephalus. No hydrocephalus. Basilar cisterns are patent. Vascular: No hyperdense vessel or unexpected calcification. Skull: Unchanged postoperative appearance from recent bifrontal burr holes. Sinuses/Orbits: No acute findings. Other: None. IMPRESSION: Similar size of the bilateral subdural collections compared to the recent brain MRI, accounting for differences in modality. Unchanged 6 mm of leftward midline shift. Electronically Signed   By: Orvan Falconer M.D.   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 EEG to evaluate for seizure Level of alertness: Awake  AEDs during EEG study: LEV  Technical aspects: This EEG study was done with scalp electrodes positioned according to the 10-20 International system of electrode placement. Electrical activity was reviewed with band pass filter of 1-70Hz , sensitivity of 7 uV/mm, display speed of 62mm/sec with a 60Hz  notched filter applied as appropriate. EEG data were recorded continuously and digitally stored.  Video monitoring was available and reviewed as appropriate.  Description: EEG showed continuous generalized 3 to 6 Hz theta-delta slowing. Hyperventilation and photic stimulation were not performed.    Of note, EEG was technically difficult due to significant myogenic artifact.  ABNORMALITY - Continuous slow, generalized  IMPRESSION: This technically difficult  study is suggestive of moderate to severe diffuse encephalopathy, nonspecific etiology. No seizures or epileptiform discharges were seen throughout the recording.  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 Level of alertness: Awake AEDs during EEG study: LEV  Technical aspects: This EEG study was done with scalp electrodes positioned according to the 10-20 International system of electrode placement. Electrical activity was reviewed with band pass filter of 1-70Hz , sensitivity of 7 uV/mm, display speed of 21mm/sec with a 60Hz  notched filter applied as appropriate. EEG data were recorded continuously and digitally stored.  Video monitoring was available and reviewed as appropriate. Description: EEG showed continuous generalized 3 to 6 Hz theta-delta slowing. Hyperventilation and photic stimulation were not performed.   Of note, EEG was technically difficult due to significant myogenic artifact. ABNORMALITY - Continuous slow, generalized IMPRESSION: This technically difficult  study is suggestive of moderate to severe diffuse encephalopathy, nonspecific etiology. No seizures or epileptiform discharges were seen throughout the recording. Priyanka Annabelle Harman   US Venous Img Upper Uni Right(DVT)  Result Date: 07/24/2023 CLINICAL DATA:  Right upper extremity swelling. EXAM: RIGHT UPPER EXTREMITY VENOUS DOPPLER ULTRASOUND TECHNIQUE: Gray-scale sonography with graded compression, as well as color Doppler and duplex ultrasound were performed to evaluate the upper extremity deep venous system from the level of the subclavian vein and including the jugular, axillary, basilic, radial, ulnar and upper cephalic vein. Spectral Doppler was utilized to evaluate flow at rest and with distal augmentation maneuvers. COMPARISON:  None Available. FINDINGS: Contralateral Subclavian Vein: Respiratory phasicity is normal and symmetric with the symptomatic side. No evidence of thrombus. Normal compressibility. Internal Jugular Vein: No evidence of thrombus. Normal compressibility, respiratory phasicity and response to augmentation. Subclavian Vein: No evidence of thrombus. Normal compressibility, respiratory phasicity and response to augmentation. Axillary Vein: No evidence of thrombus. Normal  compressibility, respiratory phasicity and response to augmentation. Cephalic Vein: No evidence of thrombus. Normal compressibility, respiratory phasicity and response to augmentation. Basilic Vein: No evidence of thrombus. Normal compressibility, respiratory phasicity and response to augmentation. Brachial Veins: No evidence of thrombus. Normal compressibility, respiratory phasicity and response to augmentation. Radial Veins: No evidence of thrombus. Normal compressibility, respiratory phasicity and response to augmentation. Ulnar Veins: No evidence of thrombus. Normal compressibility, respiratory phasicity and response to augmentation. Venous Reflux:  None visualized. Other Findings:  None visualized. IMPRESSION: No evidence of DVT within the RIGHT upper extremity. Electronically Signed   By: Aram Candela M.D.   On: 07/24/2023 01:36   MR BRAIN W WO CONTRAST  Result Date: 07/23/2023 CLINICAL DATA:  ALTERED MENTAL STATUS EXAM: MRI HEAD WITHOUT AND WITH CONTRAST TECHNIQUE: Multiplanar, multiecho pulse sequences of the brain and surrounding structures were obtained without and with intravenous contrast. CONTRAST:  10mL GADAVIST GADOBUTROL 1 MMOL/ML IV SOLN COMPARISON:  04/05/2023 brain MRI 07/21/2023 head CT FINDINGS: Brain: There are bilateral holo hemispheric extra-axial collections, measuring 9 mm on the right and 8 mm on the left. There are areas of diffusion restriction within the collection, which may be due to the presence of blood clot or field inhomogeneity caused by pneumocephalus. Moderate anterior pneumocephalus. No acute infarct. Mass effect on both superior hemispheres. No midline shift. No hydrocephalus. The midline structures are normal. There is multifocal hyperintense T2-weighted signal within the periventricular and deep white matter. No abnormal contrast enhancement. Vascular: Major flow voids  are preserved. Skull and upper cervical spine: Bifrontal burr holes. Sinuses/Orbits:No paranasal  sinus fluid levels or advanced mucosal thickening. No mastoid or middle ear effusion. Normal orbits. IMPRESSION: 1. Bilateral holohemispheric extra-axial collections, measuring 9 mm on the right and 8 mm on the left. 2. Mass effect on both superior hemispheres. No midline shift. 3. No specific features of infection. Electronically Signed   By: Deatra Robinson M.D.   On: 07/23/2023 23:55   DG Chest Port 1 View  Result Date: 07/22/2023 CLINICAL DATA:  77 year old male with fever and sepsis. EXAM: PORTABLE CHEST 1 VIEW COMPARISON:  Portable chest 07/15/2023 and earlier. FINDINGS: Portable AP semi upright view at 0936 hours. Slightly lower lung volumes. Stable cardiac size and mediastinal contours. Sequelae of TAVR. Increased pulmonary interstitium which most resembles vascular congestion appears unchanged since 07/15/2023. No pneumothorax, pleural effusion or confluent lung opacity. Partially visible left shoulder arthroplasty. No acute osseous abnormality identified. Visualized tracheal air column is within normal limits. IMPRESSION: Lower lung volumes with ongoing interstitial opacity favored due to vascular congestion. Mild interstitial edema is possible but no pneumonia or pleural effusion is identified. Electronically Signed   By: Odessa Fleming M.D.   On: 07/22/2023 12:56   CT HEAD WO CONTRAST ( )  Result Date: 07/21/2023 CLINICAL DATA:  Lethargy, postop EXAM: CT HEAD WITHOUT CONTRAST TECHNIQUE: Contiguous axial images were obtained from the base of the skull through the vertex without intravenous contrast. RADIATION DOSE REDUCTION: This exam was performed according to the departmental dose-optimization program which includes automated exposure control, adjustment of the mA and/or kV according to patient size and/or use of iterative reconstruction technique. COMPARISON:  07/20/2023 FINDINGS: Brain: Unchanged appearance of bifrontal pneumocephalus and bilateral hemispheric, predominantly isodense extra-axial  collections. Small amount of hyperdense blood bilaterally is unchanged. No midline shift or other mass effect. Vascular: No hyperdense vessel or unexpected calcification. Skull: Right parietal and left frontal burr holes. Sinuses/Orbits: No acute finding. Other: None. IMPRESSION: Unchanged appearance of bifrontal pneumocephalus and bilateral hemispheric, predominantly isodense subdural hematomas. Electronically Signed   By: Deatra Robinson M.D.   On: 07/21/2023 21:21   EEG adult  Result Date: 07/21/2023 Jefferson Fuel, MD     07/21/2023  4:39 PM Routine EEG Report Joshua Berry is a 77 y.o. male with a history of seizure who is undergoing an EEG to evaluate for seizures. Report: This EEG was acquired with electrodes placed according to the International 10-20 electrode system (including Fp1, Fp2, F3, F4, C3, C4, P3, P4, O1, O2, T3, T4, T5, T6, A1, A2, Fz, Cz, Pz). The following electrodes were missing or displaced: none. The occipital dominant rhythm was 3-5 Hz. This activity is reactive to stimulation. Drowsiness was manifested by background fragmentation; deeper stages of sleep were identified by K complexes and sleep spindles. There was no focal slowing. There were no interictal epileptiform discharges. There were no electrographic seizures identified. Photic stimulation and hyperventilation were not performed. Impression and clinical correlation: This EEG was obtained while awake and asleep and is abnormal due to moderate to severe diffuse slowing indicative of global cerebral dysfunction. Epileptiform abnormalities were not seen during this recording. Bing Neighbors, MD [facility] [phone] If 7pm- 7am, please page neurology on call as listed in AMION.   CT HEAD WO CONTRAST ( )  Result Date: 07/20/2023 CLINICAL DATA:  Initial evaluation for headache. EXAM: CT HEAD WITHOUT CONTRAST TECHNIQUE: Contiguous axial images were obtained from the base of the skull through the vertex without  intravenous contrast.  RADIATION DOSE REDUCTION: This exam was performed according to the departmental dose-optimization program which includes automated exposure control, adjustment of the mA and/or kV according to patient size and/or use of iterative reconstruction technique. COMPARISON:  Prior CT from 07/16/2023. FINDINGS: Brain: Postoperative changes from prior bilateral burr hole craniotomy for subdural evacuation. Postoperative pneumocephalus has decreased from prior. Residual bilateral subdural hematomas are similar measuring up to approximately 9 mm bilaterally. Similar extension along the falx and tentorium. No evidence for significant interval bleeding. No significant midline shift. Basilar cisterns remain patent. No other acute intracranial hemorrhage. No large vessel territory infarct. No mass lesion. No hydrocephalus. Vascular: No abnormal hyperdense vessel. Scattered calcified atherosclerosis present about the skull base. Skull: Prior bilateral burr hole craniotomy. Skin staples remain in place. Sinuses/Orbits: Globes and orbital soft tissues demonstrate no acute finding. Paranasal sinuses remain largely clear. No significant mastoid effusion. Other: None. IMPRESSION: 1. Postoperative changes from prior bilateral burr hole craniotomy for subdural evacuation. Residual bilateral subdural hematomas are similar measuring up to 9 mm bilaterally. No evidence for significant interval bleeding. No significant midline shift. 2. No other new acute intracranial abnormality. Electronically Signed   By: Rise Mu M.D.   On: 07/20/2023 20:03    Microbiology: Results for orders placed or performed during the hospital encounter of 08/12/23  Culture, blood (Routine X 2) w Reflex to ID Panel     Status: None   Collection Time: 08/13/23  1:18 AM   Specimen: BLOOD  Result Value Ref Range Status   Specimen Description BLOOD BLOOD RIGHT HAND  Final   Special Requests   Final    IN PEDIATRIC BOTTLE Blood  Culture results may not be optimal due to an excessive volume of blood received in culture bottles   Culture   Final    NO GROWTH 5 DAYS Performed at [facility], [address] [place], [place] [address]    Report Status 08/18/2023 FINAL  Final  Culture, blood (Routine X 2) w Reflex to ID Panel     Status: None   Collection Time: 08/13/23  1:19 AM   Specimen: BLOOD  Result Value Ref Range Status   Specimen Description BLOOD BLOOD RIGHT ARM  Final   Special Requests   Final    BOTTLES DRAWN AEROBIC AND ANAEROBIC Blood Culture adequate volume   Culture   Final    NO GROWTH 5 DAYS Performed at [facility], [address] [place], [place] [address]    Report Status 08/18/2023 FINAL  Final  SARS Coronavirus 2 by RT PCR (hospital order, performed in [facility] hospital lab) *cepheid single result test* Anterior Nasal Swab     Status: None   Collection Time: 08/13/23  2:57 AM   Specimen: Anterior Nasal Swab  Result Value Ref Range Status   SARS Coronavirus 2 by RT PCR NEGATIVE NEGATIVE Final    Comment: (NOTE) SARS-CoV-2 target nucleic acids are NOT DETECTED.  The SARS-CoV-2 RNA is generally detectable in upper and lower respiratory specimens during the acute phase of infection. The lowest concentration of SARS-CoV-2 viral copies this assay can detect is 250 copies / mL. A negative result does not preclude SARS-CoV-2 infection and should not be used as the sole basis for treatment or other patient management decisions.  A negative result may occur with improper specimen collection / handling, submission of specimen other than nasopharyngeal swab, presence of viral mutation(s) within the areas targeted by this assay, and inadequate number of viral copies (<  250 copies / mL). A negative result must be combined with clinical observations, patient history, and epidemiological information.  Fact Sheet for Patients:    RoadLapTop.co.za  Fact Sheet for Healthcare Providers: http://kim-miller.com/  This test is not yet approved or  cleared by the [place] FDA and has been authorized for detection and/or diagnosis of SARS-CoV-2 by FDA under an Emergency Use Authorization (EUA).  This EUA will remain in effect (meaning this test can be used) for the duration of the COVID-19 declaration under Section 564(b)(1) of the Act, 21 U.S.C. section 360bbb-3(b)(1), unless the authorization is terminated or revoked sooner.  Performed at [facility], [address] [place], [place] [address]   Respiratory (~20 pathogens) panel by PCR     Status: None   Collection Time: 08/13/23  3:33 PM   Specimen: Nasopharyngeal Swab; Respiratory  Result Value Ref Range Status   Adenovirus NOT DETECTED NOT DETECTED Final   Coronavirus 229E NOT DETECTED NOT DETECTED Final    Comment: (NOTE) The Coronavirus on the Respiratory Panel, DOES NOT test for the novel  Coronavirus (2019 nCoV)    Coronavirus HKU1 NOT DETECTED NOT DETECTED Final   Coronavirus NL63 NOT DETECTED NOT DETECTED Final   Coronavirus OC43 NOT DETECTED NOT DETECTED Final   Metapneumovirus NOT DETECTED NOT DETECTED Final   Rhinovirus / Enterovirus NOT DETECTED NOT DETECTED Final   Influenza A NOT DETECTED NOT DETECTED Final   Influenza B NOT DETECTED NOT DETECTED Final   Parainfluenza Virus 1 NOT DETECTED NOT DETECTED Final   Parainfluenza Virus 2 NOT DETECTED NOT DETECTED Final   Parainfluenza Virus 3 NOT DETECTED NOT DETECTED Final   Parainfluenza Virus 4 NOT DETECTED NOT DETECTED Final   Respiratory Syncytial Virus NOT DETECTED NOT DETECTED Final   Bordetella pertussis NOT DETECTED NOT DETECTED Final   Bordetella Parapertussis NOT DETECTED NOT DETECTED Final   Chlamydophila pneumoniae NOT DETECTED NOT DETECTED Final   Mycoplasma pneumoniae NOT DETECTED NOT DETECTED Final    Comment: Performed at  [facility], [address], [place], [place] [address]    Labs: CBC: Recent Labs  Lab 08/12/23 1659 08/13/23 0118 08/15/23 0502 08/17/23 0603 08/19/23 1430  WBC 5.5 4.8 3.7* 2.7* 2.7*  HGB 11.9* 11.9* 11.2* 10.7* 10.6*  HCT 34.3* 34.8* 32.5* 30.6* 30.2*  MCV 94.2 95.6 94.8 94.2 94.1  PLT 75* 75* 51* 61* 56*   Basic Metabolic Panel: Recent Labs  Lab 08/12/23 1659 08/13/23 0118 08/15/23 0502 08/17/23 0603 08/19/23 1430  NA 137 135 137 132* 132*  K 4.0 3.9 3.9 3.8 4.0  CL 105 101 99 103 100  CO2 24 24 23 22 25   GLUCOSE 116* 128* 135* 107* 162*  BUN 10 10 11 14 11   CREATININE 0.64 0.76 0.72 0.70 0.82  CALCIUM 9.2 9.1 9.5 8.5* 8.6*   Liver Function Tests: Recent Labs  Lab 08/13/23 0118 08/15/23 0502  AST 67* 78*  ALT 64* 68*  ALKPHOS 108 100  BILITOT 1.9* 1.7*  PROT 6.5 6.1*  ALBUMIN 2.8* 2.6*   CBG: Recent Labs  Lab 08/18/23 1144 08/18/23 1609 08/18/23 2030 08/19/23 0753 08/19/23 1233  GLUCAP 189* 145* 144* 113* 128*    Discharge time spent: greater than 30 minutes.  Signed: Alford Highland, MD [facility] 08/19/2023

## 2023-08-19 NOTE — Consult Note (Signed)
[facility] Customer service manager [facility]) Accountable Care Organization (ACO) [facility] Liaison Note  08/19/2023  [name] Sep 11, 1946 [id number]  Location: [facility] RN Hospital Liaison screened the patient remotely at [facility].  Insurance:  [employer] and Medicare   Joshua Berry is a 77 y.o. male who is a [facility]. The patient was screened for 30 day readmission hospitalization with noted high risk score for unplanned readmission risk with 4 IP in 6 months.  The patient was assessed for potential [facility]) Care Management service needs for post hospital transition for care coordination. Review of patient's electronic medical record reveals patient admitted with Disorientation secondary to Bilateral subdural Hematoma. Pt discharged to SNF for his ongoing needs. Liaison will make a referral due to his ACO [facility] will refer to PAC-RN for to follow for care coordination needs once discharged via SNF level of care.    [facility] Care Management/Population Health does not replace or interfere with any arrangements made by the Inpatient Transition of Care team.   For questions contact:   [name], RN, [facility] Liaison [facility]   Population Health Office Hours MTWF  8:00 am-6:00 pm [phone] mobile [phone] [Office toll free line] Office Hours are M-F 8:30 - 5 pm [email or web address]

## 2023-08-19 NOTE — TOC Transition Note (Signed)
Transition of Care [facility]) - CM/SW Discharge Note   Patient Details  Name: Joshua Berry MRN: [id number] Date of Birth: 04-14-1946  Transition of Care [facility]) CM/SW Contact:  Truddie Hidden, RN Phone Number: 08/19/2023, 1:28 PM   Clinical Narrative:     Spoke with Tiffany  in admissions at [facility]  Per facility patient admission confirmed for today. Patient assigned room # 506 Nurse will call report to [phone] Message left for patient's daughter, [name] regarding discharge  Face sheet and medical necessity forms printed to the floor to be added to the EMS pack EMS arranged  Discharge summary and SNF transfer report sent in HUB.  TOC signing off.    Final next level of care: Skilled Nursing Facility Barriers to Discharge: Barriers Resolved   Patient Goals and CMS Choice      Discharge Placement                Patient chooses bed at: [facility] Patient to be transferred to facility by: [facility] Name of family member notified: [name] Patient and family notified of of transfer: 08/19/23  Discharge Plan and Services Additional resources added to the After Visit Summary for                                       Social Determinants of Health (SDOH) Interventions SDOH Screenings   Food Insecurity: No Food Insecurity (08/13/2023)  Housing: Low Risk  (08/13/2023)  Transportation Needs: No Transportation Needs (08/13/2023)  Recent Concern: Transportation Needs - Unmet Transportation Needs (06/17/2023)  Utilities: Not At Risk (08/13/2023)  Recent Concern: Utilities - At Risk (06/17/2023)  Depression (PHQ2-9): Low Risk  (04/24/2023)  Financial Resource Strain: Low Risk  (08/06/2023)   [facility]  Social Connections: Unknown (04/29/2022)   [facility] from [facility]  Tobacco Use: Medium Risk (08/12/2023)     Readmission Risk Interventions    08/14/2023    2:18 PM 07/15/2023    8:52 AM 06/18/2023   10:19 AM   Readmission Risk Prevention Plan  Transportation Screening Complete Complete Complete  PCP or Specialist Appt within 5-7 Days   Complete  PCP or Specialist Appt within 3-5 Days Complete Complete   Home Care Screening   Complete  Medication Review (RN CM)   Complete  HRI or Home Care Consult Complete    Social Work Consult for Recovery Care Planning/Counseling Complete Complete   Palliative Care Screening Not Applicable Not Applicable   Medication Review Oceanographer) Complete Referral to Pharmacy

## 2023-08-20 DIAGNOSIS — S065XAA Traumatic subdural hemorrhage with loss of consciousness status unknown, initial encounter: Secondary | ICD-10-CM | POA: Diagnosis not present

## 2023-08-20 DIAGNOSIS — G9341 Metabolic encephalopathy: Secondary | ICD-10-CM | POA: Diagnosis not present

## 2023-08-20 DIAGNOSIS — K7460 Unspecified cirrhosis of liver: Secondary | ICD-10-CM | POA: Diagnosis not present

## 2023-08-20 LAB — GLUCOSE, CAPILLARY
Glucose-Capillary: 129 mg/dL — ABNORMAL HIGH (ref 70–99)
Glucose-Capillary: 200 mg/dL — ABNORMAL HIGH (ref 70–99)

## 2023-08-20 NOTE — TOC Transition Note (Addendum)
Transition of Care [facility]) - CM/SW Discharge Note   Patient Details  Name: Joshua Berry MRN: [id number] Date of Birth: August 16, 1946  Transition of Care [facility]) CM/SW Contact:  Truddie Hidden, RN Phone Number: 08/20/2023, 12:17 PM   Clinical Narrative:    Attempt to reach Tiffany, Admissions Coordinator to confirm patient acceptance to the facility today. No answer. Left a message.  Face sheet and medical necessity forms printed to the floor to be added to the EMS pack.   Spoke with Tiffany  in admissions at [facility]  Per facility patient admission confirmed for today. Patient assigned room # 506 Nurse will call report to [phone] Message left for patient's daughter, [name] EMS arranged  Discharge summary and SNF transfer report sent in [place].  TOC signing off.     Final next level of care: Skilled Nursing Facility Barriers to Discharge: Barriers Resolved   Patient Goals and CMS Choice      Discharge Placement                Patient [facility] bed at: [facility] Patient to be transferred to facility by: [facility] Name of family member notified: [name] Patient and family notified of of transfer: 08/19/23  Discharge Plan and Services Additional resources added to the After Visit Summary for                                       Social Determinants of Health (SDOH) Interventions SDOH Screenings   Food Insecurity: No Food Insecurity (08/13/2023)  Housing: Low Risk  (08/13/2023)  Transportation Needs: No Transportation Needs (08/13/2023)  Recent Concern: Transportation Needs - Unmet Transportation Needs (06/17/2023)  Utilities: Not At Risk (08/13/2023)  Recent Concern: Utilities - At Risk (06/17/2023)  Depression (PHQ2-9): Low Risk  (04/24/2023)  Financial Resource Strain: Low Risk  (08/06/2023)   Received from [facility]  Social Connections: Unknown (04/29/2022)   Received from [facility]  Tobacco Use: Medium Risk (08/12/2023)      Readmission Risk Interventions    08/14/2023    2:18 PM 07/15/2023    8:52 AM 06/18/2023   10:19 AM  Readmission Risk Prevention Plan  Transportation Screening Complete Complete Complete  PCP or Specialist Appt within 5-7 Days   Complete  PCP or Specialist Appt within 3-5 Days Complete Complete   Home Care Screening   Complete  Medication Review (RN CM)   Complete  HRI or Home Care Consult Complete    Social Work Consult for Recovery Care Planning/Counseling Complete Complete   Palliative Care Screening Not Applicable Not Applicable   Medication Review Oceanographer) Complete Referral to Pharmacy

## 2023-08-20 NOTE — Discharge Summary (Signed)
Physician Discharge Summary   [name] MRN: [id number] DOB: 12/26/1945  Admit date:     08/12/2023  Discharge date: 08/20/23  Discharge Physician: Marrion Coy   PCP: [facility]   Recommendations at discharge:   Follow-up with PCP in nursing home in 1 week. Follow-up with neurosurgery in 2 or 3 weeks.  Discharge Diagnoses: Principal Problem:   Acute metabolic encephalopathy Active Problems:   Staphylococcus epidermidis bacteremia   Subdural hematoma (HCC)   Uncal herniation (HCC)   Cirrhosis of liver without ascites (HCC)   Acute hepatic encephalopathy (HCC)   Pancytopenia (HCC)   Chronic diastolic CHF (congestive heart failure) (HCC)   HLD (hyperlipidemia)   GERD without esophagitis   Controlled IDDM-2 with hyperglycemia   Hypothyroidism   Bilateral subdural hematomas (HCC)   Malnutrition of moderate degree   Decubitus ulcer of coccyx, stage I   Right wrist pain Hyponatremia secondary to liver cirrhosis. Resolved Problems:   * No resolved hospital problems. *  Hospital Course: 77 y.o. male with medical history significant of NASH, liver cirrhosis, hepatic encephalopathy, esophageal varices, GI bleeding, anemia, thrombocytopenia, HTN, HLD, DM, dCHF, hypothyroidism, OSA not on CPAP, melanoma, s/p of TAVR, recent admission due to subdural hematoma, MRSE bacteremia, who presents with altered mental status.   Patient was recently hospitalized from 7/29 - 8/16 due to subdural hematoma with uncal hernia.  Patient was s/p of s/p bilateral frontal burr hole washout procedure on 7/29, and then transferred to [facility] where pt had MMA embolization with NIR 8/19, then d/c'd to rehab facility on Friday (8/23). Per discharge summary, pt also had MRSE bacteremia. "Since pt had a subdural drain that had drained spinal fluid, there is concern for noscomial meningitis. Since has high risk for doing LP, decision was made to start tx for meningitis empirically. 8/15 as per ID  patient may need vancomycin for 2 to 4 weeks".  Per his daughter, for reason vancomycin was not continued after patient was transferred to [facility].   Per his daughter, patient was alert and orientated x 3 when patient was transferred to rehab facility on 8/23, but becomes confused in the past several days.  When I saw patient in ED, patient is obviously confused and lethargic.  He is arousable and still orientated x 3 when aroused.  He moves all extremities.  No facial droop or slurred speech.  Patient does not have chest pain, cough or SOB.  No nausea, vomiting or abdominal pain.  Patient has loose stool which his daughter attributes to lactulose use.  No symptoms of UTI.  8/28.  Neurosurgery did not want to do any invasive procedure at this time.  Patient started on IV vancomycin for previous Staph epidermidis in blood cultures.  Patient's family interested in starting a stimulant so he can be up and awake and eat. 8/29.  Patient more alert today.  Very stiff.  As per patient's wife ate better last night. 8/30.  Patient more alert today than yesterday.  Ate a better breakfast this morning. 8/31.  Patient holding more of a conversation today than yesterday.  Eating more. 9/1.  Patient able to straight leg raise on his own today.   9/2.  Patient asking more questions today. 9/3.  Family concerned that his mental status was not as good today as previously.  Repeat laboratory data showed a sodium of 132 creatinine 0.82, ammonia 34, white blood count 2.7, hemoglobin 10.6 and platelet count of 56.  CT scan of the  head reviewed with neurosurgery and recommended conservative management.  He did not think this was a new blood.  Patient this morning having trouble completing full thoughts.  This afternoon doing better with that.  Restarted Keppra.  Assessment and Plan: * Acute metabolic encephalopathy Could be secondary to partially treated Staph epidermidis infection.  Vancomycin completed yesterday since blood  cultures negative for 5 days.  Patient's wife thinks that he improved after starting Adderall.  I will continue this upon discharge.  Patient needs lactulose and xifaxan.  Patients mental status has improved during the hospital course.  Family concerned that mental status not as good today as yesterday.  CT scan of the head done but pending official read.  Laboratory data would not explain this.  Will restart Keppra.  Staphylococcus epidermidis bacteremia Previous hospitalization ID recommended 2 to 4 weeks of IV vancomycin but this was not continued after discharge from [facility].  Empirically on vancomycin and repeat blood cultures so far negative for 5 days.  Discontinued vancomycin.  Subdural hematoma (HCC) Bilateral subdural hematomas and uncal herniation.  Status post bilateral frontal burr hole and then had an embolization procedure.  Restart Keppra.  Cirrhosis of liver without ascites (HCC) Pancytopenia and elevated liver function test secondary to cirrhosis.  Continue Xifaxan and lactulose.  Today's ammonia level 34.  Acute hepatic encephalopathy (HCC) Continue Xifaxan and lactulose.  Pancytopenia (HCC) Platelet count 56, hemoglobin 10.6 and white cell count 2.7  Chronic diastolic CHF (congestive heart failure) (HCC) No signs of heart failure.  Last EF 60%.  HLD (hyperlipidemia) On Lipitor.  CK normal range  GERD without esophagitis On Protonix  Controlled IDDM-2 with hyperglycemia On Semglee insulin and sliding scale  Hypothyroidism On levothyroxine, last TSH normal range.  Right wrist pain X-ray shows arthritis.  No fracture.  Patient is very stiff all over his body.   Decubitus ulcer of coccyx, stage I Also on buttock.  See full description below, present on admission.  Malnutrition of moderate degree Continue supplements  The above was taken from discharge summary performed by Dr. Renae Gloss, I examined the patient today, patient is still stable for discharge to nursing  home as scheduled.  Pressure ulcers POA. Follow with RN Pressure Injury 06/17/23 Coccyx Mid Stage 2 -  Partial thickness loss of dermis presenting as a shallow open injury with a red, pink wound bed without slough. (Active)  06/17/23 0615  Location: Coccyx  Location Orientation: Mid  Staging: Stage 2 -  Partial thickness loss of dermis presenting as a shallow open injury with a red, pink wound bed without slough.  Wound Description (Comments):   Present on Admission: Yes     Pressure Injury 07/18/23 Buttocks Left;Right Stage 1 -  Intact skin with non-blanchable redness of a localized area usually over a bony prominence. (Active)  07/18/23 1450  Location: Buttocks  Location Orientation: Left;Right  Staging: Stage 1 -  Intact skin with non-blanchable redness of a localized area usually over a bony prominence.  Wound Description (Comments):   Present on Admission: Yes          Consultants: Neurosurgery Procedures performed: None  Disposition: Skilled nursing facility Diet recommendation:  Discharge Diet Orders (From admission, onward)     Start     Ordered   08/20/23 0000  Diet general       Comments: Dys 2 diet   08/20/23 1036           Dysphagia type 2 Thin Liquid DISCHARGE MEDICATION: Allergies as  of 08/20/2023       Reactions   Camphor Swelling, Rash   Latex Rash   Lisinopril Cough   Other Reaction(s): Cough   Sulfamethoxazole Swelling   Chocolate    Family states he is allergic   Nsaids Other (See Comments)   NASH        Medication List     STOP taking these medications    ascorbic acid 500 MG tablet Commonly known as: VITAMIN C   Aspercreme Lidocaine 4 % Liqd Generic drug: Lidocaine HCl   augmented betamethasone dipropionate 0.05 % cream Commonly known as: DIPROLENE-AF   bisacodyl 5 MG EC tablet Commonly known as: DULCOLAX   folic acid 1 MG tablet Commonly known as: FOLVITE   magnesium oxide 400 MG tablet Commonly known as: MAG-OX    Semaglutide(0.25 or 0.5MG /DOS) 2 MG/1.5ML Sopn   vancomycin HCl 1500 MG/300ML Soln Commonly known as: VANCOREADY       TAKE these medications    acetaminophen 500 MG tablet Commonly known as: TYLENOL Take 1 tablet (500 mg total) by mouth every 6 (six) hours as needed for moderate pain, fever, headache or mild pain (headache).   amphetamine-dextroamphetamine 5 MG tablet Commonly known as: ADDERALL Take 1 tablet (5 mg total) by mouth daily with breakfast.   atorvastatin 20 MG tablet Commonly known as: LIPITOR Take 20 mg by mouth at bedtime.   butalbital-acetaminophen-caffeine 50-325-40 MG tablet Commonly known as: FIORICET Take 1 tablet by mouth every 8 (eight) hours as needed for headache. What changed: how much to take   Cholecalciferol 50 MCG (2000 UT) Tabs Take 1 tablet by mouth daily.   clotrimazole 1 % external solution Commonly known as: LOTRIMIN Apply 1 Application topically 2 (two) times daily. Apply to toe nails   famotidine 20 MG tablet Commonly known as: PEPCID Take 20 mg by mouth daily.   feeding supplement Liqd Take 237 mLs by mouth 3 (three) times daily between meals.   Fish Oil 1000 MG Caps Take 1 capsule by mouth 2 (two) times daily.   furosemide 20 MG tablet Commonly known as: LASIX Take 1 tablet (20 mg total) by mouth daily. What changed: additional instructions   insulin aspart 100 UNIT/ML injection Commonly known as: novoLOG Inject 5 Units into the skin 3 (three) times daily before meals. What changed: how much to take   insulin glargine 100 UNIT/ML injection Commonly known as: LANTUS Inject 0.1 mLs (10 Units total) into the skin at bedtime. What changed:  how much to take when to take this   ketotifen 0.025 % ophthalmic solution Commonly known as: ZADITOR Place 1 drop into both eyes 2 (two) times daily.   lactulose 10 GM/15ML solution Commonly known as: CHRONULAC Take 15 mLs (10 g total) by mouth 3 (three) times daily. What  changed: how much to take   levETIRAcetam 250 MG tablet Commonly known as: KEPPRA Take 1 tablet (250 mg total) by mouth 2 (two) times daily.   levothyroxine 100 MCG tablet Commonly known as: SYNTHROID Take 100 mcg by mouth daily before breakfast.   lidocaine 5 % Commonly known as: LIDODERM Place 2 patches onto the skin daily. Remove & Discard patch within 12 hours or as directed by MD   magnesium oxide 400 (240 Mg) MG tablet Commonly known as: MAG-OX Take 1 tablet (400 mg total) by mouth daily. What changed: when to take this   multivitamin with minerals Tabs tablet Take 1 tablet by mouth daily.   pantoprazole 40  MG tablet Commonly known as: Protonix Take 1 tablet (40 mg total) by mouth 2 (two) times daily.   RA Probiotic Digestive Care Caps Take 1 capsule by mouth daily.   rifaximin 550 MG Tabs tablet Commonly known as: XIFAXAN Take 550 mg by mouth 2 (two) times daily. Takes with lactulose   terazosin 5 MG capsule Commonly known as: HYTRIN Take 5 mg by mouth at bedtime.   trolamine salicylate 10 % cream Commonly known as: ASPERCREME Apply topically as needed for muscle pain.   zinc oxide 20 % ointment Apply 1 Application topically in the morning and at bedtime. APPLY SMALL AMOUNT TO AFFECTED AREA TWICE A DAY FOR BUTTOCK RASH FOR BUTTOCK RASH               Discharge Care Instructions  (From admission, onward)           Start     Ordered   08/20/23 0000  Discharge wound care:       Comments: Follow with RN for wounds   08/20/23 1036            Contact information for follow-up providers     [facility] Follow up in 1 day(s).          Venetia Night, MD Follow up in 3 week(s).   Specialty: Neurosurgery Why: Please call the office to make this appointment. Thanks! Contact information: [address] [place] [address] [phone]              Contact information for after-discharge care      Destination     [facility] Preferred [facility]F .   Service: Skilled Nursing Contact information: [address] [place] [address] [phone]                    Discharge Exam: Ceasar Mons Weights   08/12/23 1639 08/12/23 1700 08/13/23 0500  Weight: 107 kg 90.8 kg 95.3 kg   General exam: Appears calm and comfortable  Respiratory system: Clear to auscultation. Respiratory effort normal. Cardiovascular system: S1 & S2 heard, RRR. No JVD, murmurs, rubs, gallops or clicks. No pedal edema. Gastrointestinal system: Abdomen is nondistended, soft and nontender. No organomegaly or masses felt. Normal bowel sounds heard. Central nervous system: Alert and oriented. No focal neurological deficits. Extremities: Symmetric 5 x 5 power. Skin: No rashes, lesions or ulcers Psychiatry: Judgement and insight appear normal. Mood & affect appropriate.    Condition at discharge: good  The results of significant diagnostics from this hospitalization (including imaging, microbiology, ancillary and laboratory) are listed below for reference.   Imaging Studies: CT HEAD WO CONTRAST ( )  Result Date: 08/19/2023 CLINICAL DATA:  Altered mental status, subdural hematoma EXAM: CT HEAD WITHOUT CONTRAST TECHNIQUE: Contiguous axial images were obtained from the base of the skull through the vertex without intravenous contrast. RADIATION DOSE REDUCTION: This exam was performed according to the departmental dose-optimization program which includes automated exposure control, adjustment of the mA and/or kV according to patient size and/or use of iterative reconstruction technique. COMPARISON:  CT head 08/12/2023 FINDINGS: Brain: Mixed density bilateral subdural hematomas are again seen measuring up to 1.2 cm on the right and 0.9 cm on the left in maximal thickness in the coronal plane, unchanged in size when measured again using  similar technique (4-49, 4-55). A small amount of hyperdensity anteriorly on the right may reflect redistribution or a small  amount of new acute blood there is unchanged mass effect on the underlying brain parenchyma with sulcal effacement and 6-7 mm leftward midline shift, unchanged. There is no other new acute intracranial hemorrhage. There is no acute territorial infarct. Parenchymal volume is stable. The ventricles are stable in size. Gray-white differentiation is preserved The pituitary and suprasellar region are normal. There is no solid mass lesion. Vascular: No hyperdense vessel or unexpected calcification. Skull: Bilateral burr holes are again noted. Sinuses/Orbits: The paranasal sinuses are clear. The globes and orbits are unremarkable. Other: The mastoid air cells and middle ear cavities are clear. IMPRESSION: Stable size of the mixed density bilateral subdural hematomas measuring up to 1.2 cm on the right and 0.9 cm on the left with unchanged 6-7 mm leftward midline shift. A small amount of hyperdensity on the right may reflect redistribution or a small amount of new acute blood. Electronically Signed   By: Lesia Hausen M.D.   On: 08/19/2023 16:09   DG Wrist 2 Views Right  Result Date: 08/14/2023 CLINICAL DATA:  Right wrist pain for 2 days.  No known injury. EXAM: RIGHT WRIST - 2 VIEW COMPARISON:  None Available. FINDINGS: Frontal and lateral views of the right wrist. 1.5 mm ulnar negative variance. Moderate to severe thumb carpometacarpal joint space narrowing, subchondral sclerosis, and subchondral cystic change, and peripheral osteophytosis. Moderate index finger metacarpophalangeal and triscaphe joint space narrowing. No acute fracture or dislocation. Moderate atherosclerotic calcifications. IMPRESSION: 1. 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 2. Moderate index finger metacarpophalangeal and triscaphe osteoarthritis. Electronically Signed   By: Neita Garnet M.D.   On:  08/14/2023 12:26   CT Head Wo Contrast  Result Date: 08/12/2023 CLINICAL DATA:  Mental status change of unknown cause EXAM: CT HEAD WITHOUT CONTRAST TECHNIQUE: Contiguous axial images were obtained from the base of the skull through the vertex without intravenous contrast. RADIATION DOSE REDUCTION: This exam was performed according to the departmental dose-optimization program which includes automated exposure control, adjustment of the mA and/or kV according to patient size and/or use of iterative reconstruction technique. COMPARISON:  07/28/2023 FINDINGS: Brain: No focal abnormality affects the brainstem or cerebellum. Bilateral low to intermediate density subdural collections persist, maximal thickness on the right 11 mm compared with 13 mm 2 weeks ago. Maximal thickness on the left is 6 mm, compared with 7 mm 2 weeks ago. Resolution of previously seen subdural air. Mass effect persists with right to left shift of 6 mm, unchanged. No evidence of parenchymal infarction. No hydrocephalus. Vascular: There is atherosclerotic calcification of the major vessels at the base of the brain. Skull: No acute finding.  Bilateral burr holes. Sinuses/Orbits: Clear/normal Other: None IMPRESSION: Bilateral low to intermediate density subdural collections persist, maximal thickness on the right 11 mm compared with 13 mm 2 weeks ago. Maximal thickness on the left 6 mm, compared with 7 mm 2 weeks ago. Resolution of previously seen subdural air. Mass effect persists with right to left shift of 6 mm, unchanged. No worsening finding. Electronically Signed   By: Paulina Fusi M.D.   On: 08/12/2023 17:08   ECHOCARDIOGRAM COMPLETE  Result Date: 07/31/2023    ECHOCARDIOGRAM REPORT   Patient Name:   RYUNOSUKE BUCHANAN Lindstrom Date of Exam: 07/31/2023 Medical Rec #:  [id number]     Height:       74.0 in Accession #:    [id number]    Weight:       237.7 lb Date of Birth:  16-Apr-1946  BSA:          2.339 m Patient Age:    77 years      BP:            125/68 mmHg Patient Gender: M             HR:           76 bpm. Exam Location:  [facility] Procedure: 2D Echo, Cardiac Doppler, Color Doppler and Intracardiac            Opacification Agent Indications:     Post TAVR evaluation  History:         Patient has prior history of Echocardiogram examinations, most                  recent 04/09/2023. CHF, Signs/Symptoms:Chest Pain, Altered                  Mental Status and Bacteremia; Risk Factors:Hypertension,                  Diabetes and Dyslipidemia. 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  Sonographer:     Mikki Harbor Referring Phys:  [id number] Gillis Santa Diagnosing Phys: Julien Nordmann MD  Sonographer Comments: Technically difficult study due to poor echo windows and suboptimal apical window. IMPRESSIONS  1. Left ventricular ejection fraction, by estimation, is 60 to 65%. The left ventricle has normal function. The left ventricle has no regional wall motion abnormalities. There is moderate left ventricular hypertrophy. Left ventricular diastolic parameters are consistent with Grade I diastolic dysfunction (impaired relaxation).  2. Right ventricular systolic function is normal. The right ventricular size is normal. There is normal pulmonary artery systolic pressure. The estimated right ventricular systolic pressure is 34.6 mmHg.  3. Left atrial size was mildly dilated.  4. The mitral valve is normal in structure. No evidence of mitral valve regurgitation. No evidence of mitral stenosis. Moderate mitral annular calcification.  5. The aortic valve has been repaired/replaced, bioprosthetic valve, s/p TAVR. Aortic valve regurgitation is not visualized. Aortic valve mean gradient measures 12.7 mmHg.  6. The inferior vena cava is normal in size with greater than 50% respiratory variability, suggesting right atrial pressure of 3 mmHg. FINDINGS  Left Ventricle: Left ventricular ejection fraction, by estimation, is 60 to 65%. The left  ventricle has normal function. The left ventricle has no regional wall motion abnormalities. Definity contrast agent was given IV to delineate the left ventricular  endocardial borders. The left ventricular internal cavity size was normal in size. There is moderate left ventricular hypertrophy. Left ventricular diastolic parameters are consistent with Grade I diastolic dysfunction (impaired relaxation). Right Ventricle: The right ventricular size is normal. No increase in right ventricular wall thickness. Right ventricular systolic function is normal. There is normal pulmonary artery systolic pressure. The tricuspid regurgitant velocity is 2.58 m/s, and  with an assumed right atrial pressure of 8 mmHg, the estimated right ventricular systolic pressure is 34.6 mmHg. Left Atrium: Left atrial size was mildly dilated. Right Atrium: Right atrial size was normal in size. Pericardium: There is no evidence of pericardial effusion. Mitral Valve: The mitral valve is normal in structure. Moderate mitral annular calcification. No evidence of mitral valve regurgitation. No evidence of mitral valve stenosis. MV peak gradient, 9.1 mmHg. The mean mitral valve gradient is 4.0 mmHg. Tricuspid Valve: The tricuspid valve is  normal in structure. Tricuspid valve regurgitation is mild . No evidence of tricuspid stenosis. Aortic Valve: The aortic valve has been repaired/replaced. Aortic valve regurgitation is not visualized. Aortic valve sclerosis is present, with no evidence of aortic valve stenosis. Aortic valve mean gradient measures 12.7 mmHg. Aortic valve peak gradient measures 25.1 mmHg. Aortic valve area, by VTI measures 1.40 cm. 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 Pulmonic Valve: The pulmonic valve was normal in structure. Pulmonic valve regurgitation is not visualized. No evidence of pulmonic stenosis. Aorta: The aortic root is normal in size and structure. Venous: The inferior vena cava is normal in  size with greater than 50% respiratory variability, suggesting right atrial pressure of 3 mmHg. IAS/Shunts: No atrial level shunt detected by color flow Doppler.  LEFT VENTRICLE PLAX 2D LVIDd:         5.40 cm   Diastology LVIDs:         3.50 cm   LV e' medial:    7.51 cm/s LV PW:         1.30 cm   LV E/e' medial:  17.8 LV IVS:        1.50 cm   LV e' lateral:   10.90 cm/s LVOT diam:     2.00 cm   LV E/e' lateral: 12.3 LV SV:         72 LV SV Index:   31 LVOT Area:     3.14 cm  RIGHT VENTRICLE RV Basal diam:  4.90 cm RV S prime:     16.60 cm/s LEFT ATRIUM           Index        RIGHT ATRIUM           Index LA diam:      4.70 cm 2.01 cm/m   RA Area:     24.50 cm LA Vol (A4C): 63.0 ml 26.94 ml/m  RA Volume:   69.60 ml  29.76 ml/m  AORTIC VALVE                     PULMONIC VALVE AV Area (Vmax):    1.29 cm      PV Vmax:       1.47 m/s AV Area (Vmean):   1.22 cm      PV Peak grad:  8.6 mmHg AV Area (VTI):     1.40 cm AV Vmax:           250.33 cm/s AV Vmean:          160.000 cm/s AV VTI:            0.513 m AV Peak Grad:      25.1 mmHg AV Mean Grad:      12.7 mmHg LVOT Vmax:         102.50 cm/s LVOT Vmean:        62.250 cm/s LVOT VTI:          0.228 m LVOT/AV VTI ratio: 0.44  AORTA Ao Root diam: 3.60 cm MITRAL VALVE                TRICUSPID VALVE MV Area (PHT): 1.72 cm     TR Peak grad:   26.6 mmHg MV Area VTI:   1.43 cm     TR Vmax:        258.00 cm/s MV Peak grad:  9.1 mmHg MV Mean grad:  4.0 mmHg     SHUNTS  MV Vmax:       1.51 m/s     Systemic VTI:  0.23 m MV Vmean:      88.8 cm/s    Systemic Diam: 2.00 cm MV Decel Time: 440 msec MV E velocity: 134.00 cm/s MV A velocity: 153.00 cm/s MV E/A ratio:  0.88 Julien Nordmann MD Electronically signed by Julien Nordmann MD Signature Date/Time: 07/31/2023/4:48:21 PM    Final    CT HEAD WO CONTRAST ( )  Result Date: 07/28/2023 CLINICAL DATA:  Mental status change, unknown cause. EXAM: CT HEAD WITHOUT CONTRAST TECHNIQUE: Contiguous axial images were obtained from the  base of the skull through the vertex without intravenous contrast. RADIATION DOSE REDUCTION: This exam was performed according to the departmental dose-optimization program which includes automated exposure control, adjustment of the mA and/or kV according to patient size and/or use of iterative reconstruction technique. COMPARISON:  Head CT 07/21/2023.  Brain MRI 07/23/2023. FINDINGS: Brain: Similar size of the bilateral subdural collections compared to the recent brain MRI, accounting for differences in modality. Unchanged 6 mm of leftward midline shift. Continued decrease in pneumocephalus. No hydrocephalus. Basilar cisterns are patent. Vascular: No hyperdense vessel or unexpected calcification. Skull: Unchanged postoperative appearance from recent bifrontal burr holes. Sinuses/Orbits: No acute findings. Other: None. IMPRESSION: Similar size of the bilateral subdural collections compared to the recent brain MRI, accounting for differences in modality. Unchanged 6 mm of leftward midline shift. Electronically Signed   By: Orvan Falconer M.D.   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 EEG to evaluate for seizure Level of alertness: Awake  AEDs during EEG study: LEV  Technical aspects: This EEG study was done with scalp electrodes positioned according to the 10-20 International system of electrode placement. Electrical activity was reviewed with band pass filter of 1-70Hz ,  sensitivity of 7 uV/mm, display speed of 26mm/sec with a 60Hz  notched filter applied as appropriate. EEG data were recorded continuously and digitally stored.  Video monitoring was available and reviewed as appropriate.  Description: EEG showed continuous generalized 3 to 6 Hz theta-delta slowing. Hyperventilation and photic stimulation were not performed.    Of note, EEG was technically difficult due to significant myogenic artifact.  ABNORMALITY - Continuous slow, generalized  IMPRESSION: This technically difficult  study is suggestive of moderate to severe diffuse encephalopathy, nonspecific etiology. No seizures or epileptiform discharges were seen throughout the recording.  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 Level of alertness: Awake AEDs during EEG study: LEV Technical aspects: This EEG study was done with scalp electrodes positioned according to the 10-20 International system of electrode placement. Electrical activity was reviewed with band pass filter of 1-70Hz , sensitivity of 7 uV/mm, display speed of 34mm/sec with a 60Hz  notched filter applied as appropriate. EEG data were recorded continuously and digitally stored.  Video monitoring was available and reviewed as appropriate. Description: EEG showed continuous generalized 3 to 6 Hz theta-delta slowing. Hyperventilation and photic stimulation were not performed.   Of note, EEG was technically  difficult due to significant myogenic artifact. ABNORMALITY - Continuous slow, generalized IMPRESSION: This technically difficult  study is suggestive of moderate to severe diffuse encephalopathy, nonspecific etiology. No seizures or epileptiform discharges were seen throughout the recording. Priyanka Annabelle Harman   US Venous Img Upper Uni Right(DVT)  Result Date: 07/24/2023 CLINICAL DATA:  Right upper extremity swelling. EXAM: RIGHT UPPER EXTREMITY VENOUS DOPPLER ULTRASOUND TECHNIQUE: Gray-scale sonography with graded compression, as well as color Doppler and duplex ultrasound were performed to evaluate the upper extremity deep venous system from the level of the subclavian vein and including the jugular, axillary, basilic, radial, ulnar and upper cephalic vein. Spectral Doppler was utilized to evaluate flow at rest and with distal augmentation maneuvers. COMPARISON:  None Available. FINDINGS: Contralateral Subclavian Vein: Respiratory phasicity is normal and symmetric with the symptomatic side. No evidence of thrombus. Normal compressibility. Internal Jugular Vein: No evidence of thrombus. Normal compressibility, respiratory phasicity and response to augmentation. Subclavian Vein: No evidence of thrombus. Normal compressibility, respiratory phasicity and response to augmentation. Axillary Vein: No evidence of thrombus. Normal compressibility, respiratory phasicity and response to augmentation. Cephalic Vein: No evidence of thrombus. Normal compressibility, respiratory phasicity and response to augmentation. Basilic Vein: No evidence of thrombus. Normal compressibility, respiratory phasicity and response to augmentation. Brachial Veins: No evidence of thrombus. Normal compressibility, respiratory phasicity and response to augmentation. Radial Veins: No evidence of thrombus. Normal compressibility, respiratory phasicity and response to augmentation. Ulnar Veins: No evidence of thrombus. Normal compressibility,  respiratory phasicity and response to augmentation. Venous Reflux:  None visualized. Other Findings:  None visualized. IMPRESSION: No evidence of DVT within the RIGHT upper extremity. Electronically Signed   By: Aram Candela M.D.   On: 07/24/2023 01:36   MR BRAIN W WO CONTRAST  Result Date: 07/23/2023 CLINICAL DATA:  ALTERED MENTAL STATUS EXAM: MRI HEAD WITHOUT AND WITH CONTRAST TECHNIQUE: Multiplanar, multiecho pulse sequences of the brain and surrounding structures were obtained without and with intravenous contrast. CONTRAST:  10mL GADAVIST GADOBUTROL 1 MMOL/ML IV SOLN COMPARISON:  04/05/2023 brain MRI 07/21/2023 head CT FINDINGS: Brain: There are bilateral holo hemispheric extra-axial collections, measuring 9 mm on the right and 8 mm on the left. There are areas of diffusion restriction within the collection, which may be due to the presence of blood clot or field inhomogeneity caused by pneumocephalus. Moderate anterior pneumocephalus. No acute infarct. Mass effect on both superior hemispheres. No midline shift. No hydrocephalus. The midline structures are normal. There is multifocal hyperintense T2-weighted signal within the periventricular and deep white matter. No abnormal contrast enhancement. Vascular: Major flow voids are preserved. Skull and upper cervical spine: Bifrontal burr holes. Sinuses/Orbits:No paranasal sinus fluid levels or advanced mucosal thickening. No mastoid or middle ear effusion. Normal orbits. IMPRESSION: 1. Bilateral holohemispheric extra-axial collections, measuring 9 mm on the right and 8 mm on the left. 2. Mass effect on both superior hemispheres. No midline shift. 3. No  specific features of infection. Electronically Signed   By: Deatra Robinson M.D.   On: 07/23/2023 23:55   DG Chest Port 1 View  Result Date: 07/22/2023 CLINICAL DATA:  77 year old male with fever and sepsis. EXAM: PORTABLE CHEST 1 VIEW COMPARISON:  Portable chest 07/15/2023 and earlier. FINDINGS: Portable  AP semi upright view at 0936 hours. Slightly lower lung volumes. Stable cardiac size and mediastinal contours. Sequelae of TAVR. Increased pulmonary interstitium which most resembles vascular congestion appears unchanged since 07/15/2023. No pneumothorax, pleural effusion or confluent lung opacity. Partially visible left shoulder arthroplasty. No acute osseous abnormality identified. Visualized tracheal air column is within normal limits. IMPRESSION: Lower lung volumes with ongoing interstitial opacity favored due to vascular congestion. Mild interstitial edema is possible but no pneumonia or pleural effusion is identified. Electronically Signed   By: Odessa Fleming M.D.   On: 07/22/2023 12:56   CT HEAD WO CONTRAST ( )  Result Date: 07/21/2023 CLINICAL DATA:  Lethargy, postop EXAM: CT HEAD WITHOUT CONTRAST TECHNIQUE: Contiguous axial images were obtained from the base of the skull through the vertex without intravenous contrast. RADIATION DOSE REDUCTION: This exam was performed according to the departmental dose-optimization program which includes automated exposure control, adjustment of the mA and/or kV according to patient size and/or use of iterative reconstruction technique. COMPARISON:  07/20/2023 FINDINGS: Brain: Unchanged appearance of bifrontal pneumocephalus and bilateral hemispheric, predominantly isodense extra-axial collections. Small amount of hyperdense blood bilaterally is unchanged. No midline shift or other mass effect. Vascular: No hyperdense vessel or unexpected calcification. Skull: Right parietal and left frontal burr holes. Sinuses/Orbits: No acute finding. Other: None. IMPRESSION: Unchanged appearance of bifrontal pneumocephalus and bilateral hemispheric, predominantly isodense subdural hematomas. Electronically Signed   By: Deatra Robinson M.D.   On: 07/21/2023 21:21   EEG adult  Result Date: 07/21/2023 Jefferson Fuel, MD     07/21/2023  4:39 PM Routine EEG Report TEDDIE KIMBERLEY is a 77 y.o.  male with a history of seizure who is undergoing an EEG to evaluate for seizures. Report: This EEG was acquired with electrodes placed according to the International 10-20 electrode system (including Fp1, Fp2, F3, F4, C3, C4, P3, P4, O1, O2, T3, T4, T5, T6, A1, A2, Fz, Cz, Pz). The following electrodes were missing or displaced: none. The occipital dominant rhythm was 3-5 Hz. This activity is reactive to stimulation. Drowsiness was manifested by background fragmentation; deeper stages of sleep were identified by K complexes and sleep spindles. There was no focal slowing. There were no interictal epileptiform discharges. There were no electrographic seizures identified. Photic stimulation and hyperventilation were not performed. Impression and clinical correlation: This EEG was obtained while awake and asleep and is abnormal due to moderate to severe diffuse slowing indicative of global cerebral dysfunction. Epileptiform abnormalities were not seen during this recording. Bing Neighbors, MD [facility] [phone] If 7pm- 7am, please page neurology on call as listed in AMION.    Microbiology: Results for orders placed or performed during the hospital encounter of 08/12/23  Culture, blood (Routine X 2) w Reflex to ID Panel     Status: None   Collection Time: 08/13/23  1:18 AM   Specimen: BLOOD  Result Value Ref Range Status   Specimen Description BLOOD BLOOD RIGHT HAND  Final   Special Requests   Final    IN PEDIATRIC BOTTLE Blood Culture results may not be optimal due to an excessive volume of blood received in culture bottles   Culture   Final  NO GROWTH 5 DAYS Performed at [facility], [address] [place], [place] [address]    Report Status 08/18/2023 FINAL  Final  Culture, blood (Routine X 2) w Reflex to ID Panel     Status: None   Collection Time: 08/13/23  1:19 AM   Specimen: BLOOD  Result Value Ref Range Status   Specimen Description BLOOD BLOOD RIGHT ARM  Final    Special Requests   Final    BOTTLES DRAWN AEROBIC AND ANAEROBIC Blood Culture adequate volume   Culture   Final    NO GROWTH 5 DAYS Performed at [facility], [address] [place], [place] [address]    Report Status 08/18/2023 FINAL  Final  SARS Coronavirus 2 by RT PCR (hospital order, performed in [facility] hospital lab) *cepheid single result test* Anterior Nasal Swab     Status: None   Collection Time: 08/13/23  2:57 AM   Specimen: Anterior Nasal Swab  Result Value Ref Range Status   SARS Coronavirus 2 by RT PCR NEGATIVE NEGATIVE Final    Comment: (NOTE) SARS-CoV-2 target nucleic acids are NOT DETECTED.  The SARS-CoV-2 RNA is generally detectable in upper and lower respiratory specimens during the acute phase of infection. The lowest concentration of SARS-CoV-2 viral copies this assay can detect is 250 copies / mL. A negative result does not preclude SARS-CoV-2 infection and should not be used as the sole basis for treatment or other patient management decisions.  A negative result may occur with improper specimen collection / handling, submission of specimen other than nasopharyngeal swab, presence of viral mutation(s) within the areas targeted by this assay, and inadequate number of viral copies (<250 copies / mL). A negative result must be combined with clinical observations, patient history, and epidemiological information.  Fact Sheet for Patients:   RoadLapTop.co.za  Fact Sheet for Healthcare Providers: http://kim-miller.com/  This test is not yet approved or  cleared by the [place] FDA and has been authorized for detection and/or diagnosis of SARS-CoV-2 by FDA under an Emergency Use Authorization (EUA).  This EUA will remain in effect (meaning this test can be used) for the duration of the COVID-19 declaration under Section 564(b)(1) of the Act, 21 U.S.C. section 360bbb-3(b)(1), unless the  authorization is terminated or revoked sooner.  Performed at [facility], [address] [place], [place] [address]   Respiratory (~20 pathogens) panel by PCR     Status: None   Collection Time: 08/13/23  3:33 PM   Specimen: Nasopharyngeal Swab; Respiratory  Result Value Ref Range Status   Adenovirus NOT DETECTED NOT DETECTED Final   Coronavirus 229E NOT DETECTED NOT DETECTED Final    Comment: (NOTE) The Coronavirus on the Respiratory Panel, DOES NOT test for the novel  Coronavirus (2019 nCoV)    Coronavirus HKU1 NOT DETECTED NOT DETECTED Final   Coronavirus NL63 NOT DETECTED NOT DETECTED Final   Coronavirus OC43 NOT DETECTED NOT DETECTED Final   Metapneumovirus NOT DETECTED NOT DETECTED Final   Rhinovirus / Enterovirus NOT DETECTED NOT DETECTED Final   Influenza A NOT DETECTED NOT DETECTED Final   Influenza B NOT DETECTED NOT DETECTED Final   Parainfluenza Virus 1 NOT DETECTED NOT DETECTED Final   Parainfluenza Virus 2 NOT DETECTED NOT DETECTED Final   Parainfluenza Virus 3 NOT DETECTED NOT DETECTED Final   Parainfluenza Virus 4 NOT DETECTED NOT DETECTED Final   Respiratory Syncytial Virus NOT DETECTED NOT DETECTED Final   Bordetella pertussis NOT DETECTED  NOT DETECTED Final   Bordetella Parapertussis NOT DETECTED NOT DETECTED Final   Chlamydophila pneumoniae NOT DETECTED NOT DETECTED Final   Mycoplasma pneumoniae NOT DETECTED NOT DETECTED Final    Comment: Performed at [facility], [address] [place], [place] [address]    Labs: CBC: Recent Labs  Lab 08/15/23 0502 08/17/23 0603 08/19/23 1430  WBC 3.7* 2.7* 2.7*  HGB 11.2* 10.7* 10.6*  HCT 32.5* 30.6* 30.2*  MCV 94.8 94.2 94.1  PLT 51* 61* 56*   Basic Metabolic Panel: Recent Labs  Lab 08/15/23 0502 08/17/23 0603 08/19/23 1430  NA 137 132* 132*  K 3.9 3.8 4.0  CL 99 103 100  CO2 23 22 25   GLUCOSE 135* 107* 162*  BUN 11 14 11   CREATININE 0.72 0.70 0.82  CALCIUM 9.5 8.5* 8.6*   Liver  Function Tests: Recent Labs  Lab 08/15/23 0502  AST 78*  ALT 68*  ALKPHOS 100  BILITOT 1.7*  PROT 6.1*  ALBUMIN 2.6*   CBG: Recent Labs  Lab 08/19/23 0753 08/19/23 1233 08/19/23 1611 08/19/23 2111 08/20/23 0809  GLUCAP 113* 128* 130* 114* 129*    Discharge time spent: less than 30 minutes.  Signed: Marrion Coy, MD [facility] 08/20/2023

## 2023-08-20 NOTE — Progress Notes (Signed)
Report called to [name], RN of [facility] and all questions answered.  Pt. Will return to [facility] to room 506 via EMS.

## 2023-08-22 NOTE — Telephone Encounter (Signed)
I spoke to [name] pt's wife. They need to contact the pt's PCP at the [facility] to request authorization for the appts here and CT.

## 2023-08-25 ENCOUNTER — Telehealth: Payer: Self-pay | Admitting: Neurosurgery

## 2023-08-25 NOTE — Telephone Encounter (Signed)
[name] patient's daughter is calling. She says that her dad is having mood swings, seems more depressed and agitated than before. She feels that is might be a side effect  to keppra of adderal. Would you recommend they wean him off of the adderal or keppra?

## 2023-08-26 ENCOUNTER — Encounter: Payer: No Typology Code available for payment source | Admitting: Neurosurgery

## 2023-08-26 NOTE — Telephone Encounter (Signed)
Joshua Night, MD    Okay to wean off Keppra.  Would continue Adderall for now.  I will defer management of that medication to his primary provider.    I spoke to patient's daughter and informed her of above message.  Per Dr. Myer Haff he is to take Keppra 1 time daily for 1 week then stop. Order was sent to [facility]

## 2023-09-04 NOTE — Telephone Encounter (Signed)
[facility] auth was received yesterday. I spoke to [name] pt's wife this morning to call iamging department to schedule his CT and that we may have to reschedule his appt with Dr.Yarbrough.

## 2023-09-09 ENCOUNTER — Encounter: Payer: No Typology Code available for payment source | Admitting: Neurosurgery

## 2023-09-09 ENCOUNTER — Ambulatory Visit
Admission: RE | Admit: 2023-09-09 | Discharge: 2023-09-09 | Disposition: A | Discharge status: Home / Self Care | Payer: No Typology Code available for payment source | Source: Ambulatory Visit | Attending: Neurosurgery | Admitting: Neurosurgery

## 2023-09-09 DIAGNOSIS — S065XAA Traumatic subdural hemorrhage with loss of consciousness status unknown, initial encounter: Secondary | ICD-10-CM | POA: Diagnosis present

## 2023-09-10 ENCOUNTER — Telehealth: Payer: Self-pay | Admitting: Neurosurgery

## 2023-09-10 NOTE — Telephone Encounter (Signed)
Spoke with Joshua Berry. CT is improved.  Will see him in clinic in 3 weeks.

## 2023-09-15 ENCOUNTER — Other Ambulatory Visit: Payer: Self-pay

## 2023-09-15 ENCOUNTER — Inpatient Hospital Stay: Payer: No Typology Code available for payment source

## 2023-09-15 ENCOUNTER — Emergency Department: Payer: No Typology Code available for payment source

## 2023-09-15 ENCOUNTER — Encounter: Payer: Self-pay | Admitting: Internal Medicine

## 2023-09-15 ENCOUNTER — Inpatient Hospital Stay
Admission: EM | Admit: 2023-09-15 | Discharge: 2023-09-25 | DRG: 441 | Disposition: A | Discharge status: Skilled Nursing Facility | Payer: No Typology Code available for payment source | Source: Skilled Nursing Facility | Attending: Internal Medicine | Admitting: Internal Medicine

## 2023-09-15 DIAGNOSIS — E1169 Type 2 diabetes mellitus with other specified complication: Secondary | ICD-10-CM | POA: Diagnosis not present

## 2023-09-15 DIAGNOSIS — Z888 Allergy status to other drugs, medicaments and biological substances status: Secondary | ICD-10-CM

## 2023-09-15 DIAGNOSIS — D61818 Other pancytopenia: Secondary | ICD-10-CM | POA: Diagnosis present

## 2023-09-15 DIAGNOSIS — L89152 Pressure ulcer of sacral region, stage 2: Secondary | ICD-10-CM | POA: Diagnosis present

## 2023-09-15 DIAGNOSIS — E039 Hypothyroidism, unspecified: Secondary | ICD-10-CM | POA: Diagnosis present

## 2023-09-15 DIAGNOSIS — I110 Hypertensive heart disease with heart failure: Secondary | ICD-10-CM | POA: Diagnosis present

## 2023-09-15 DIAGNOSIS — L89311 Pressure ulcer of right buttock, stage 1: Secondary | ICD-10-CM | POA: Diagnosis present

## 2023-09-15 DIAGNOSIS — W19XXXS Unspecified fall, sequela: Secondary | ICD-10-CM | POA: Diagnosis not present

## 2023-09-15 DIAGNOSIS — K7460 Unspecified cirrhosis of liver: Secondary | ICD-10-CM | POA: Diagnosis present

## 2023-09-15 DIAGNOSIS — E785 Hyperlipidemia, unspecified: Secondary | ICD-10-CM | POA: Diagnosis present

## 2023-09-15 DIAGNOSIS — Z85820 Personal history of malignant melanoma of skin: Secondary | ICD-10-CM | POA: Diagnosis not present

## 2023-09-15 DIAGNOSIS — Z91018 Allergy to other foods: Secondary | ICD-10-CM

## 2023-09-15 DIAGNOSIS — K7581 Nonalcoholic steatohepatitis (NASH): Secondary | ICD-10-CM | POA: Diagnosis present

## 2023-09-15 DIAGNOSIS — Z87891 Personal history of nicotine dependence: Secondary | ICD-10-CM

## 2023-09-15 DIAGNOSIS — I5032 Chronic diastolic (congestive) heart failure: Secondary | ICD-10-CM | POA: Diagnosis present

## 2023-09-15 DIAGNOSIS — G9340 Encephalopathy, unspecified: Secondary | ICD-10-CM

## 2023-09-15 DIAGNOSIS — Z23 Encounter for immunization: Secondary | ICD-10-CM

## 2023-09-15 DIAGNOSIS — Z79890 Hormone replacement therapy (postmenopausal): Secondary | ICD-10-CM | POA: Diagnosis not present

## 2023-09-15 DIAGNOSIS — W19XXXA Unspecified fall, initial encounter: Secondary | ICD-10-CM | POA: Diagnosis present

## 2023-09-15 DIAGNOSIS — K7682 Hepatic encephalopathy: Secondary | ICD-10-CM | POA: Diagnosis not present

## 2023-09-15 DIAGNOSIS — E782 Mixed hyperlipidemia: Secondary | ICD-10-CM | POA: Diagnosis not present

## 2023-09-15 DIAGNOSIS — Z8249 Family history of ischemic heart disease and other diseases of the circulatory system: Secondary | ICD-10-CM | POA: Diagnosis not present

## 2023-09-15 DIAGNOSIS — S0990XA Unspecified injury of head, initial encounter: Secondary | ICD-10-CM

## 2023-09-15 DIAGNOSIS — Z882 Allergy status to sulfonamides status: Secondary | ICD-10-CM

## 2023-09-15 DIAGNOSIS — R296 Repeated falls: Secondary | ICD-10-CM

## 2023-09-15 DIAGNOSIS — Y92129 Unspecified place in nursing home as the place of occurrence of the external cause: Secondary | ICD-10-CM

## 2023-09-15 DIAGNOSIS — K219 Gastro-esophageal reflux disease without esophagitis: Secondary | ICD-10-CM | POA: Diagnosis present

## 2023-09-15 DIAGNOSIS — Z794 Long term (current) use of insulin: Secondary | ICD-10-CM

## 2023-09-15 DIAGNOSIS — I5030 Unspecified diastolic (congestive) heart failure: Secondary | ICD-10-CM

## 2023-09-15 DIAGNOSIS — S80212A Abrasion, left knee, initial encounter: Secondary | ICD-10-CM | POA: Diagnosis present

## 2023-09-15 DIAGNOSIS — E119 Type 2 diabetes mellitus without complications: Secondary | ICD-10-CM | POA: Diagnosis present

## 2023-09-15 DIAGNOSIS — Z952 Presence of prosthetic heart valve: Secondary | ICD-10-CM | POA: Diagnosis not present

## 2023-09-15 DIAGNOSIS — Z751 Person awaiting admission to adequate facility elsewhere: Secondary | ICD-10-CM

## 2023-09-15 DIAGNOSIS — Z66 Do not resuscitate: Secondary | ICD-10-CM | POA: Diagnosis present

## 2023-09-15 DIAGNOSIS — G9341 Metabolic encephalopathy: Secondary | ICD-10-CM | POA: Diagnosis not present

## 2023-09-15 DIAGNOSIS — L89321 Pressure ulcer of left buttock, stage 1: Secondary | ICD-10-CM | POA: Diagnosis present

## 2023-09-15 DIAGNOSIS — S0001XA Abrasion of scalp, initial encounter: Secondary | ICD-10-CM | POA: Diagnosis present

## 2023-09-15 DIAGNOSIS — D696 Thrombocytopenia, unspecified: Secondary | ICD-10-CM | POA: Diagnosis present

## 2023-09-15 DIAGNOSIS — Z79899 Other long term (current) drug therapy: Secondary | ICD-10-CM

## 2023-09-15 DIAGNOSIS — G4733 Obstructive sleep apnea (adult) (pediatric): Secondary | ICD-10-CM | POA: Diagnosis present

## 2023-09-15 DIAGNOSIS — S065XAA Traumatic subdural hemorrhage with loss of consciousness status unknown, initial encounter: Secondary | ICD-10-CM | POA: Diagnosis present

## 2023-09-15 DIAGNOSIS — Z91040 Latex allergy status: Secondary | ICD-10-CM

## 2023-09-15 HISTORY — DX: Unspecified fall, initial encounter: W19.XXXA

## 2023-09-15 LAB — CBC WITH DIFFERENTIAL/PLATELET
Abs Immature Granulocytes: 0.01 10*3/uL (ref 0.00–0.07)
Basophils Absolute: 0 10*3/uL (ref 0.0–0.1)
Basophils Relative: 1 %
Eosinophils Absolute: 0.1 10*3/uL (ref 0.0–0.5)
Eosinophils Relative: 4 %
HCT: 32 % — ABNORMAL LOW (ref 39.0–52.0)
Hemoglobin: 10.6 g/dL — ABNORMAL LOW (ref 13.0–17.0)
Immature Granulocytes: 0 %
Lymphocytes Relative: 21 %
Lymphs Abs: 0.8 10*3/uL (ref 0.7–4.0)
MCH: 31.5 pg (ref 26.0–34.0)
MCHC: 33.1 g/dL (ref 30.0–36.0)
MCV: 95.2 fL (ref 80.0–100.0)
Monocytes Absolute: 0.5 10*3/uL (ref 0.1–1.0)
Monocytes Relative: 14 %
Neutro Abs: 2.2 10*3/uL (ref 1.7–7.7)
Neutrophils Relative %: 60 %
Platelets: 58 10*3/uL — ABNORMAL LOW (ref 150–400)
RBC: 3.36 MIL/uL — ABNORMAL LOW (ref 4.22–5.81)
RDW: 17.2 % — ABNORMAL HIGH (ref 11.5–15.5)
WBC: 3.6 10*3/uL — ABNORMAL LOW (ref 4.0–10.5)
nRBC: 0 % (ref 0.0–0.2)

## 2023-09-15 LAB — COMPREHENSIVE METABOLIC PANEL
ALT: 36 U/L (ref 0–44)
AST: 52 U/L — ABNORMAL HIGH (ref 15–41)
Albumin: 2.9 g/dL — ABNORMAL LOW (ref 3.5–5.0)
Alkaline Phosphatase: 108 U/L (ref 38–126)
Anion gap: 10 (ref 5–15)
BUN: 12 mg/dL (ref 8–23)
CO2: 21 mmol/L — ABNORMAL LOW (ref 22–32)
Calcium: 9 mg/dL (ref 8.9–10.3)
Chloride: 107 mmol/L (ref 98–111)
Creatinine, Ser: 0.76 mg/dL (ref 0.61–1.24)
GFR, Estimated: >60 mL/min (ref >60)
Glucose, Bld: 162 mg/dL — ABNORMAL HIGH (ref 70–99)
Potassium: 3.5 mmol/L (ref 3.5–5.1)
Sodium: 138 mmol/L (ref 135–145)
Total Bilirubin: 2 mg/dL — ABNORMAL HIGH (ref 0.3–1.2)
Total Protein: 6.4 g/dL — ABNORMAL LOW (ref 6.5–8.1)

## 2023-09-15 LAB — PROTIME-INR
INR: 1.4 — ABNORMAL HIGH (ref 0.8–1.2)
Prothrombin Time: 17.1 s — ABNORMAL HIGH (ref 11.4–15.2)

## 2023-09-15 LAB — URINALYSIS, ROUTINE W REFLEX MICROSCOPIC
Bilirubin Urine: NEGATIVE
Glucose, UA: NEGATIVE mg/dL
Hgb urine dipstick: NEGATIVE
Ketones, ur: NEGATIVE mg/dL
Leukocytes,Ua: NEGATIVE
Nitrite: NEGATIVE
Protein, ur: NEGATIVE mg/dL
Specific Gravity, Urine: 1.016 (ref 1.005–1.030)
pH: 7 (ref 5.0–8.0)

## 2023-09-15 LAB — TSH: TSH: 2.89 u[IU]/mL (ref 0.350–4.500)

## 2023-09-15 LAB — AMMONIA: Ammonia: 102 umol/L — ABNORMAL HIGH (ref 9–35)

## 2023-09-15 LAB — CK: Total CK: 83 U/L (ref 49–397)

## 2023-09-15 MED ORDER — KETOCONAZOLE 2 % EX CREA
TOPICAL_CREAM | Freq: Every day | CUTANEOUS | Status: DC
  Filled 2023-09-15 (×2): qty 15

## 2023-09-15 MED ORDER — ACETAMINOPHEN 325 MG PO TABS
650.0000 mg | ORAL_TABLET | Freq: Four times a day (QID) | ORAL | Status: DC | PRN
  Administered 2023-09-16 – 2023-09-24 (×13): 650 mg via ORAL
  Filled 2023-09-15 (×13): qty 2

## 2023-09-15 MED ORDER — TERAZOSIN HCL 5 MG PO CAPS
5.0000 mg | ORAL_CAPSULE | Freq: Every day | ORAL | Status: DC
  Administered 2023-09-15 – 2023-09-24 (×10): 5 mg via ORAL
  Filled 2023-09-15 (×10): qty 1

## 2023-09-15 MED ORDER — ONDANSETRON HCL 4 MG PO TABS: 4.0000 mg | ORAL_TABLET | Freq: Four times a day (QID) | ORAL | Status: DC | PRN

## 2023-09-15 MED ORDER — ONDANSETRON HCL 4 MG/2ML IJ SOLN: 4.0000 mg | Freq: Four times a day (QID) | INTRAMUSCULAR | Status: DC | PRN

## 2023-09-15 MED ORDER — LACTULOSE 10 GM/15ML PO SOLN
30.0000 g | Freq: Three times a day (TID) | ORAL | Status: DC
  Administered 2023-09-15 – 2023-09-25 (×28): 30 g via ORAL
  Filled 2023-09-15 (×31): qty 60

## 2023-09-15 MED ORDER — SODIUM CHLORIDE 0.9 % IV SOLN: INTRAVENOUS | Status: DC

## 2023-09-15 MED ORDER — RIFAXIMIN 550 MG PO TABS
550.0000 mg | ORAL_TABLET | Freq: Two times a day (BID) | ORAL | Status: DC
  Administered 2023-09-15 – 2023-09-25 (×21): 550 mg via ORAL
  Filled 2023-09-15 (×21): qty 1

## 2023-09-15 MED ORDER — TETANUS-DIPHTH-ACELL PERTUSSIS 5-2.5-18.5 LF-MCG/0.5 IM SUSY
0.5000 mL | PREFILLED_SYRINGE | Freq: Once | INTRAMUSCULAR | Status: AC
  Administered 2023-09-15: 0.5 mL via INTRAMUSCULAR
  Filled 2023-09-15: qty 0.5

## 2023-09-15 MED ORDER — DIPHENHYDRAMINE HCL 25 MG PO CAPS
25.0000 mg | ORAL_CAPSULE | Freq: Four times a day (QID) | ORAL | Status: DC | PRN
  Administered 2023-09-16: 25 mg via ORAL
  Filled 2023-09-15: qty 1

## 2023-09-15 MED ORDER — LACTULOSE 10 GM/15ML PO SOLN
30.0000 g | Freq: Once | ORAL | Status: AC
  Administered 2023-09-15: 30 g via ORAL
  Filled 2023-09-15: qty 60

## 2023-09-15 NOTE — Assessment & Plan Note (Signed)
WBC 3.6, hgb 10.6, plt in 50s  Looks to be at baseline  Follow

## 2023-09-15 NOTE — TOC Initial Note (Signed)
Transition of Care [facility]) - Initial/Assessment Note    Patient Details  Name: Joshua Berry MRN: [id number] Date of Birth: 1946-05-08  Transition of [facility] CM/SW Contact:    Marquita Palms, LCSW Phone Number: 09/15/2023, 8:56 AM  Clinical Narrative:                  CSW met with patient at bedside. Patient daughter is at bedside and explained that her father was at [facility] awaiting paperwork so that he can be transferred to a [facility] facilitated SNF.        Patient Goals and CMS Choice            Expected Discharge Plan and Services                                              Prior Living Arrangements/Services                       Activities of Daily Living   ADL Screening (condition at time of admission) Does the patient have a NEW difficulty with bathing/dressing/toileting/self-feeding that is expected to last >3 days?: No Does the patient have a NEW difficulty with getting in/out of bed, walking, or climbing stairs that is expected to last >3 days?: No Does the patient have a NEW difficulty with communication that is expected to last >3 days?: No Is the patient deaf or have difficulty hearing?: No Does the patient have difficulty seeing, even when wearing glasses/contacts?: No Does the patient have difficulty concentrating, remembering, or making decisions?: Yes  Permission Sought/Granted                  Emotional Assessment              Admission diagnosis:  Hepatic encephalopathy (HCC) [K76.82] Patient Active Problem List   Diagnosis Date Noted   Right wrist pain 08/14/2023   Bilateral subdural hematomas (HCC) 08/13/2023   Malnutrition of moderate degree 08/13/2023   Decubitus ulcer of coccyx, stage I 08/13/2023   Acute metabolic encephalopathy 08/12/2023   Staphylococcus epidermidis bacteremia 07/23/2023   Acute nontraumatic intracranial subdural hematoma (HCC) 07/15/2023   Subdural hematoma (HCC)  07/14/2023   Compression of brain (HCC) 07/14/2023   Uncal herniation (HCC) 07/14/2023   Type 2 diabetes mellitus without complications (HCC) 06/17/2023   GERD without esophagitis 06/17/2023   Dyslipidemia 06/17/2023   Pressure injury of skin 06/17/2023   Acute urinary retention 04/11/2023   Streptococcal bacteremia 04/07/2023   Altered mental status 04/06/2023   Hepatic encephalopathy (HCC) 04/05/2023   Overweight (BMI 25.0-29.9) 08/03/2022   Hyponatremia, hypokalemia and hypomagnesemia 05/18/2022   Hyponatremia 05/18/2022   Hypokalemia 05/18/2022   Back pain 05/17/2022   Hypomagnesemia 05/17/2022   Acute hepatic encephalopathy (HCC) 05/17/2022   AKI (acute kidney injury) (HCC) 05/16/2022   Elevated liver enzymes 05/16/2022   Hypotension 05/16/2022   Right shoulder pain 05/16/2022   Hypothyroidism 05/16/2022   Physical deconditioning 05/16/2022   Obesity (BMI 30-39.9) 05/16/2022   Lactic acidosis 05/16/2022   Fall at home, initial encounter 05/16/2022   Septic shock due to Staphylococcus bacteremia 05/14/2022   Chest pain 04/06/2022   Controlled IDDM-2 with hyperglycemia 04/06/2022   HLD (hyperlipidemia) 04/06/2022   Chronic diastolic CHF (congestive heart failure) (HCC) 04/06/2022   Pancytopenia (HCC) 04/06/2022   GI bleeding  04/24/2021   Cirrhosis of liver without ascites (HCC)    Secondary esophageal varices without bleeding (HCC)    Portal hypertension (HCC)    Stomach irritation    Melena    Acute gastrointestinal hemorrhage 07/19/2019   Severe sepsis (HCC) 12/20/2018   PCP:  [facility]:   [facility] - [place], [place] - [address] [place] [address] Phone: [phone] Fax: [phone]     Social Determinants of Health (SDOH) Social History: SDOH Screenings   Food Insecurity: Patient Unable To Answer (09/15/2023)  Housing: Patient Unable To Answer (09/15/2023)  Transportation Needs: Patient Unable To  Answer (09/15/2023)  Recent Concern: Transportation Needs - Unmet Transportation Needs (06/17/2023)  Utilities: Patient Unable To Answer (09/15/2023)  Recent Concern: Utilities - At Risk (06/17/2023)  Depression (PHQ2-9): Low Risk  (04/24/2023)  Financial Resource Strain: Low Risk  (08/06/2023)   Received from [facility]  Social Connections: Unknown (04/29/2022)   Received from [facility]  Tobacco Use: Medium Risk (09/15/2023)   SDOH Interventions:     Readmission Risk Interventions    09/15/2023    8:54 AM 08/14/2023    2:18 PM 07/15/2023    8:52 AM  Readmission Risk Prevention Plan  Transportation Screening Complete Complete Complete  PCP or Specialist Appt within 3-5 Days Complete Complete Complete  HRI or Home Care Consult Complete Complete   Social Work Consult for Recovery Care Planning/Counseling Not Complete Complete Complete  Palliative Care Screening Not Applicable Not Applicable Not Applicable  Medication Review Oceanographer) Not Complete Complete Referral to Pharmacy  Med Review Comments Patient confused and disoriented.

## 2023-09-15 NOTE — ED Notes (Signed)
Pt on bedpan at this time.

## 2023-09-15 NOTE — Assessment & Plan Note (Signed)
Statin pending CK level

## 2023-09-15 NOTE — ED Triage Notes (Signed)
Patient arrives by [facility] from [facility] after an unwitnessed fall.  Patient states he was standing and fell.  He has a wound to the center of his forehead.  Patient is not on blood thinners.  Staff reported that he is more confused than usual with a history of subdural hematoma.

## 2023-09-15 NOTE — Assessment & Plan Note (Signed)
Positive fall with noted secondary head trauma in setting of decompensated hepatic encephalopathy Stable subdural hematomas on repeat imaging Fall precautions PT OT evaluation Follow-up

## 2023-09-15 NOTE — ED Notes (Signed)
Pharmacy messaged again for pt rifaximin

## 2023-09-15 NOTE — Assessment & Plan Note (Signed)
PPI ?

## 2023-09-15 NOTE — ED Notes (Signed)
Pt had x1 bowel movement

## 2023-09-15 NOTE — ED Notes (Signed)
Pt resting in bed, pt pleasant at this time, daughter at bedside. Pt denies any needs at this time

## 2023-09-15 NOTE — ED Notes (Signed)
Pt daughter getting nurse due to pt saying he has to pee, RN changed pt brief and gave him a urinal. Pt on a bed pan

## 2023-09-15 NOTE — ED Notes (Signed)
Pt brief changed and repositioned in bed. Daughter at bedside. Pt refused breakfast at this time

## 2023-09-15 NOTE — ED Provider Notes (Signed)
[facility] Provider Note    Event Date/Time   First MD Initiated Contact with Patient 09/15/23 [phone]     (approximate)   History   No chief complaint on file.   HPI  Joshua Berry is a 77 y.o. male with history of hypertension, diabetes, liver cirrhosis secondary to [name], hepatic encephalopathy who presents to the emergency department from his nursing facility after an unwitnessed fall.  Has an abrasion to the top of his scalp and is left knee.  He is unable to provide much history.  He is awake and alert but confused and nursing home staff states that this is worse than his baseline.  Has been admitted previously for hepatic encephalopathy.  Has also previously had a subdural hematoma.  He is not on any antiplatelet or anticoagulant.   History provided by EMS, level 5 caveat secondary to confusion.    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    Past Surgical History:  Procedure Laterality Date   BURR HOLE Bilateral 07/14/2023   Procedure: BURR HOLES;  Surgeon: Venetia Night, MD;  Location: [facility] ORS;  Service: Neurosurgery;  Laterality: Bilateral;   CHOLECYSTECTOMY     COLONOSCOPY N/A 07/23/2019   Procedure: COLONOSCOPY;  Surgeon: Toney Reil, MD;  Location: [facility] ENDOSCOPY;  Service: Gastroenterology;  Laterality: N/A;   ESOPHAGOGASTRODUODENOSCOPY N/A 07/20/2019   Procedure: ESOPHAGOGASTRODUODENOSCOPY (EGD);  Surgeon: Toney Reil, MD;  Location: [facility] ENDOSCOPY;  Service: Gastroenterology;  Laterality: N/A;   ESOPHAGOGASTRODUODENOSCOPY Left 08/20/2019   Procedure:  ESOPHAGOGASTRODUODENOSCOPY (EGD);  Surgeon: Pasty Spillers, MD;  Location: [facility] ENDOSCOPY;  Service: Endoscopy;  Laterality: Left;   ESOPHAGOGASTRODUODENOSCOPY (EGD) WITH PROPOFOL N/A 04/25/2021   Procedure: ESOPHAGOGASTRODUODENOSCOPY (EGD) WITH PROPOFOL;  Surgeon: Toney Reil, MD;  Location: [facility] ENDOSCOPY;  Service: Gastroenterology;  Laterality: N/A;   MECHANICAL AORTIC VALVE REPLACEMENT     SKIN BIOPSY     TEE WITHOUT CARDIOVERSION N/A 12/23/2018   Procedure: TRANSESOPHAGEAL ECHOCARDIOGRAM (TEE);  Surgeon: Dalia Heading, MD;  Location: [facility] ORS;  Service: Cardiovascular;  Laterality: N/A;   TEE WITHOUT CARDIOVERSION N/A 05/21/2022   Procedure: TRANSESOPHAGEAL ECHOCARDIOGRAM (TEE);  Surgeon: Antonieta Iba, MD;  Location: [facility] ORS;  Service: Cardiovascular;  Laterality: N/A;   TEE WITHOUT CARDIOVERSION N/A 04/09/2023   Procedure: TRANSESOPHAGEAL ECHOCARDIOGRAM;  Surgeon: Debbe Odea, MD;  Location: [facility] ORS;  Service: Cardiovascular;  Laterality: N/A;    MEDICATIONS:  Prior to Admission medications   Medication Sig Start Date End Date Taking? Authorizing Provider  acetaminophen (TYLENOL) 500 MG tablet Take 1 tablet (500 mg total) by mouth every 6 (six) hours as needed for moderate pain, fever, headache or mild pain (headache). 07/31/23   Gillis Santa, MD  amphetamine-dextroamphetamine (ADDERALL) 5 MG tablet Take 1 tablet (5 mg total) by mouth daily with breakfast. 08/19/23   Alford Highland, MD  atorvastatin (LIPITOR) 20 MG tablet Take 20 mg by mouth at bedtime.    [provider]  butalbital-acetaminophen-caffeine (FIORICET) 50-325-40 MG tablet Take 1 tablet by  mouth every 8 (eight) hours as needed for headache. 08/19/23   Alford Highland, MD  Cholecalciferol 50 MCG (2000 UT) TABS Take 1 tablet by mouth daily.    [provider]  clotrimazole (LOTRIMIN) 1 % external solution Apply 1 Application topically 2 (two) times daily. Apply to toe nails 02/12/23    [provider]  famotidine (PEPCID) 20 MG tablet Take 20 mg by mouth daily.    [provider]  feeding supplement (ENSURE ENLIVE / ENSURE PLUS) LIQD Take 237 mLs by mouth 3 (three) times daily between meals. 07/31/23   Gillis Santa, MD  furosemide (LASIX) 20 MG tablet Take 1 tablet (20 mg total) by mouth daily. Patient taking differently: Take 20 mg by mouth daily. Only Monday, Wednesday and Friday 04/07/22   Arnetha Courser, MD  insulin aspart (NOVOLOG) 100 UNIT/ML injection Inject 5 Units into the skin 3 (three) times daily before meals. 08/19/23   Alford Highland, MD  insulin glargine (LANTUS) 100 UNIT/ML injection Inject 0.1 mLs (10 Units total) into the skin at bedtime. 08/19/23   Alford Highland, MD  ketotifen (ZADITOR) 0.025 % ophthalmic solution Place 1 drop into both eyes 2 (two) times daily.    [provider]  Lactobacillus Rhamnosus, GG, (RA PROBIOTIC DIGESTIVE CARE) CAPS Take 1 capsule by mouth daily.    [provider]  lactulose (CHRONULAC) 10 GM/15ML solution Take 15 mLs (10 g total) by mouth 3 (three) times daily. 08/19/23   Alford Highland, MD  levETIRAcetam (KEPPRA) 250 MG tablet Take 1 tablet (250 mg total) by mouth 2 (two) times daily. 07/31/23   Gillis Santa, MD  levothyroxine (SYNTHROID) 100 MCG tablet Take 100 mcg by mouth daily before breakfast.    [provider]  lidocaine (LIDODERM) 5 % Place 2 patches onto the skin daily. Remove & Discard patch within 12 hours or as directed by MD 05/28/22   [name], MD  magnesium oxide (MAG-OX) 400 (240 Mg) MG tablet Take 1 tablet (400 mg total) by mouth daily. 08/19/23   Alford Highland, MD  Multiple Vitamin (MULTIVITAMIN WITH MINERALS) TABS tablet Take 1 tablet by mouth daily. 08/01/23   Gillis Santa, MD  Omega-3 Fatty Acids (FISH OIL) 1000 MG CAPS Take 1 capsule by mouth 2 (two) times daily.    [provider]  pantoprazole (PROTONIX) 40 MG tablet Take 1 tablet (40 mg total) by mouth  2 (two) times daily. 04/07/22 08/13/23  Arnetha Courser, MD  rifaximin (XIFAXAN) 550 MG TABS tablet Take 550 mg by mouth 2 (two) times daily. Takes with lactulose    [provider]  terazosin (HYTRIN) 5 MG capsule Take 5 mg by mouth at bedtime.    [provider]  trolamine salicylate (ASPERCREME) 10 % cream Apply topically as needed for muscle pain. 05/28/22   [name], MD  zinc oxide 20 % ointment Apply 1 Application topically in the morning and at bedtime. APPLY SMALL AMOUNT TO AFFECTED AREA TWICE A DAY FOR BUTTOCK RASH FOR BUTTOCK RASH 05/13/23   [provider]    Physical Exam   Triage Vital Signs: ED Triage Vitals  Encounter Vitals Group     BP 09/15/23 0351 (!) 140/74     Systolic BP Percentile --      Diastolic BP Percentile --      Pulse Rate 09/15/23 0351 87     Resp 09/15/23 0351 13     Temp 09/15/23 0351 97.9 F (36.6 C)  Temp Source 09/15/23 0351 Oral     SpO2 09/15/23 0351 100 %     Weight 09/15/23 0347 217 lb 12.8 oz (98.8 kg)     Height 09/15/23 0347 6\' 2"  (1.88 m)     Head Circumference --      Peak Flow --      Pain Score --      Pain Loc --      Pain Education --      Exclude from Growth Chart --     Most recent vital signs: Vitals:   09/15/23 0351  BP: (!) 140/74  Pulse: 87  Resp: 13  Temp: 97.9 F (36.6 C)  SpO2: 100%    CONSTITUTIONAL: Alert, oriented to person only.  Not able to answer questions appropriately. HEAD: Normocephalic, abrasion and hematoma to the top of the scalp EYES: Conjunctivae clear, pupils appear equal, sclera nonicteric ENT: normal nose; moist mucous membranes NECK: Supple, normal ROM no midline spinal tenderness or step-off or deformity CARD: RRR; S1 and S2 appreciated RESP: Normal chest excursion without splinting or tachypnea; breath sounds clear and equal bilaterally; no wheezes, no rhonchi, no rales, no hypoxia or respiratory distress, speaking full sentences ABD/GI: Non-distended; soft,  non-tender, no rebound, no guarding, no peritoneal signs BACK: The back appears normal, no midline spinal tenderness or step-off or deformity EXT: Normal ROM in all joints; no deformity noted, no edema, abrasion to the left knee, no joint effusion, compartments soft, extremities warm well-perfused SKIN: Normal color for age and race; warm NEURO: Moves all extremities equally, normal speech, no facial asymmetry    ED Results / Procedures / Treatments   LABS: (all labs ordered are listed, but only abnormal results are displayed) Labs Reviewed  CBC WITH DIFFERENTIAL/PLATELET - Abnormal; Notable for the following components:      Result Value   WBC 3.6 (*)    RBC 3.36 (*)    Hemoglobin 10.6 (*)    HCT 32.0 (*)    RDW 17.2 (*)    Platelets 58 (*)    All other components within normal limits  COMPREHENSIVE METABOLIC PANEL - Abnormal; Notable for the following components:   CO2 21 (*)    Glucose, Bld 162 (*)    Total Protein 6.4 (*)    Albumin 2.9 (*)    AST 52 (*)    Total Bilirubin 2.0 (*)    All other components within normal limits  PROTIME-INR - Abnormal; Notable for the following components:   Prothrombin Time 17.1 (*)    INR 1.4 (*)    All other components within normal limits  AMMONIA - Abnormal; Notable for the following components:   Ammonia 102 (*)    All other components within normal limits  URINALYSIS, ROUTINE W REFLEX MICROSCOPIC     EKG:  EKG Interpretation Date/Time:  Monday September 15 2023 03:51:18 EDT Ventricular Rate:  83 PR Interval:  214 QRS Duration:  124 QT Interval:  444 QTC Calculation: 522 R Axis:   -58  Text Interpretation: Sinus rhythm Borderline prolonged PR interval Probable left atrial enlargement Left bundle branch block Confirmed by Rochele Raring [phone]) on 09/15/2023 4:36:46 AM         RADIOLOGY: My personal review and interpretation of imaging: CT head and cervical spine show no acute traumatic injury.  I have personally  reviewed all radiology reports.   CT Cervical Spine Wo Contrast  Result Date: 09/15/2023 CLINICAL DATA:  77 year old male status post unwitnessed fall. Forehead wound.  History of bilateral mixed density subdural hematomas. EXAM: CT CERVICAL SPINE WITHOUT CONTRAST TECHNIQUE: Multidetector CT imaging of the cervical spine was performed without intravenous contrast. Multiplanar CT image reconstructions were also generated. RADIATION DOSE REDUCTION: This exam was performed according to the departmental dose-optimization program which includes automated exposure control, adjustment of the mA and/or kV according to patient size and/or use of iterative reconstruction technique. COMPARISON:  Head CT today.  Cervical spine CT 07/14/2023. FINDINGS: Alignment: Stable straightening of lordosis. Cervicothoracic junction alignment is within normal limits. Bilateral posterior element alignment is within normal limits. Skull base and vertebrae: Some chronic osteopenia. Visualized skull base is intact. No atlanto-occipital dissociation. C1 and C2 appear severely degenerated, but stable and aligned. Additional degenerative changes described below. No acute osseous abnormality identified. Soft tissues and spinal canal: No prevertebral fluid or swelling. No visible canal hematoma. Calcified cervical carotid atherosclerosis. Otherwise negative visible noncontrast neck soft tissues. Disc levels: Cervical spine degeneration superimposed on multilevel chronic degenerative appearing ankylosis including C2-C3, C4 through C6. Bulky disc osteophyte degeneration at C6-C7. Chronic facet arthropathy at C3-C4. Evidence of chronic degenerative spinal stenosis at both C5-C6 and C6-C7, mild to moderate and stable. Upper chest: Visible upper thoracic levels appear intact. Stable visible lungs with subpleural reticular opacity, more so the dependent. IMPRESSION: 1. No acute traumatic injury identified in the cervical spine. 2. 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 Electronically Signed   By: Odessa Fleming M.D.   On: 09/15/2023 04:24   CT HEAD WO CONTRAST ( )  Result Date: 09/15/2023 CLINICAL DATA:  77 year old male status post unwitnessed fall. Forehead wound. History of bilateral mixed density subdural hematomas. EXAM: CT HEAD WITHOUT CONTRAST TECHNIQUE: Contiguous axial images were obtained from the base of the skull through the vertex without intravenous contrast. RADIATION DOSE REDUCTION: This exam was performed according to the departmental dose-optimization program which includes automated exposure control, adjustment of the mA and/or kV according to patient size and/or use of iterative reconstruction technique. COMPARISON:  Head CT 09/09/2023 and earlier. FINDINGS: Brain: Ongoing bilateral mixed density subdural hematoma. Hematoma on the left has mildly decreased since 09/09/2023, now 4-5 mm in thickness (previously 6-7 mm at the same level). No increase in hyperdense blood on that side. Contralateral right subdural appears stable with similar scattered hyperdense blood products, 6-7 mm thickness at most levels. These hematomas were 8-9 mm by MRI in August. Basilar cisterns remain normal. No ventriculomegaly. Leftward midline shift of 4 mm, stable to slightly improved since August. No new areas of intracranial hemorrhage identified. Stable gray-white matter differentiation throughout the brain. No cortically based acute infarct identified. Vascular: Calcified atherosclerosis at the skull base. No suspicious intracranial vascular hyperdensity. Skull: Chronic bilateral vertex burr holes. No acute fracture or new osseous abnormality identified. Sinuses/Orbits: Visualized paranasal sinuses and mastoids are clear. Other: Broad-based left anterior vertex scalp hematoma, up to 8 mm in thickness. Underlying calvarium appears stable, intact. Visualized orbit  soft tissues are within normal limits. IMPRESSION: 1. Ongoing bilateral mixed density subdural hematomas. Stable mild intracranial mass effect. Right subdural is stable, 6-7 mm in thickness. Left subdural decreased from 6-7 mm to now 4-5 mm in thickness. 2. Left anterior vertex scalp hematoma without underlying skull fracture. 3. No new intracranial abnormality. Electronically Signed   By: Odessa Fleming M.D.   On: 09/15/2023 04:20     PROCEDURES:  Critical Care performed: No   CRITICAL CARE Performed by: Baxter Hire Judye Lorino  Total critical care time: 0 minutes  Critical care time was exclusive of separately billable procedures and treating other patients.  Critical care was necessary to treat or prevent imminent or life-threatening deterioration.  Critical care was time spent personally by me on the following activities: development of treatment plan with patient and/or surrogate as well as nursing, discussions with consultants, evaluation of patient's response to treatment, examination of patient, obtaining history from patient or surrogate, ordering and performing treatments and interventions, ordering and review of laboratory studies, ordering and review of radiographic studies, pulse oximetry and re-evaluation of patient's condition.   Marland Kitchen1-3 Lead EKG Interpretation  Performed by: Anora Schwenke, Layla Maw, DO Authorized by: Kaiana Marion, Layla Maw, DO     Interpretation: normal     ECG rate:  87   ECG rate assessment: normal     Rhythm: sinus rhythm     Ectopy: none     Conduction: normal       IMPRESSION / MDM / ASSESSMENT AND PLAN / ED COURSE  I reviewed the triage vital signs and the nursing notes.    Patient here with altered mental status, fall, head injury.  The patient is on the cardiac monitor to evaluate for evidence of arrhythmia and/or significant heart rate changes.   DIFFERENTIAL DIAGNOSIS (includes but not limited to):   Intracranial hemorrhage, stroke, UTI, hepatic encephalopathy,  anemia, electrolyte derangement, thyroid dysfunction   Patient's presentation is most consistent with acute presentation with potential threat to life or bodily function.   PLAN: Will obtain labs, urine, CT head and cervical spine.  Will give IV fluids and update his tetanus vaccine.  Anticipate admission given increased altered mental status.   MEDICATIONS GIVEN IN ED: Medications  Tdap (BOOSTRIX) injection 0.5 mL (has no administration in time range)  0.9 %  sodium chloride infusion (has no administration in time range)  lactulose (CHRONULAC) 10 GM/15ML solution 30 g (has no administration in time range)     ED COURSE: CT head and cervical spine reviewed and interpreted by myself and the radiologist and show stable right subdural and decreasing left subdural.  No new traumatic injury other than a scalp hematoma.  Labs show hyperammonemia with ammonia level of 102 likely the cause of his altered mental status.  He has chronic anemia which is stable.  Normal electrolytes.  Glucose is 162.  Will give lactulose.  Will discuss with hospitalist for admission.   CONSULTS:  Consulted and discussed patient's case with hospitalist, Dr. Para March.  I have recommended admission and consulting physician agrees and will place admission orders.  Patient (and family if present) agree with this plan.   I reviewed all nursing notes, vitals, pertinent previous records.  All labs, EKGs, imaging ordered have been independently reviewed and interpreted by myself.    OUTSIDE RECORDS REVIEWED: Reviewed patient's last admission.       FINAL CLINICAL IMPRESSION(S) / ED DIAGNOSES   Final diagnoses:  Unwitnessed fall  Injury of head, initial encounter  Hepatic encephalopathy (HCC)     Rx / DC Orders   ED Discharge Orders     None        Note:  This document was prepared using Dragon voice recognition software and may include unintentional dictation errors.   Zondra Lawlor, Layla Maw, DO 09/15/23  [phone]

## 2023-09-15 NOTE — Assessment & Plan Note (Signed)
Decompensated hepatic encephalopathy with noted fall  Ammonia level 104  On lactulose and rifaximin  Will increase lactulose  Cont rifaximin  Follow closely

## 2023-09-15 NOTE — H&P (Addendum)
History and Physical    [name] [id number]:[id number] DOB: February 08, 1946 DOA: 09/15/2023 DOS: the patient was seen and examined on 09/15/2023 PCP: [facility]  Patient coming from:  rehab   Chief Complaint: No chief complaint on file.  HPI: Joshua Berry is a 77 y.o. male with medical history significant of NASH, liver cirrhosis, hepatic encephalopathy, esophageal varices, GI bleeding, anemia, thrombocytopenia, HTN, HLD, DM, dCHF, hypothyroidism, OSA not on CPAP, melanoma, s/p of TAVR, recent admission due to subdural hematoma, MRSE bacteremia presenting with fall and hepatic encephalopathy.  Limited history in the setting of encephalopathy.  Per report, patient with fall at local rehab facility.  Noted recent extended admission from August through September 2024 for issues including subdural hematoma, staphylococcal epidermidis bacteremia.  Had protracted course in the [facility] system for for issues including middle meningeal artery embolization.  Per report, patient with noted fall at the rehab.  Daughter reports worsening confusion over the past 2 days.  No fevers or chills.  No nausea or vomiting.  No reported chest pain no shortness of breath.  Per the daughter, patient can schedule medications at the rehab facility. Presented to the ER afebrile, hemodynamically stable.  White count 3.6, hemoglobin 10.6, platelets 58, urinalysis within normal limits, creatinine 0.76, glucose 162, T. bili 2.  INR 1.4.  Ammonia level 102.  EKG normal sinus rhythm.  CT head with ongoing bilateral mixed density subdural hematomas stable mild intracranial mass effect with right subdural being stable and left subdural being decreased.  Left anterior vertex scalp hematoma. Review of Systems: As mentioned in the history of present illness. All other systems reviewed and are negative. Past Medical History:  Diagnosis Date   Arthritis    Bleeding ulcer    Cancer (HCC)    melanoma, carcinoma   Cirrhosis of liver  (HCC)    Diabetes mellitus without complication (HCC)    Heart murmur    Hypertension    Iron deficiency anemia    NASH (nonalcoholic steatohepatitis)    Sleep apnea    Spleen enlarged    Thrombocytopenia (HCC)    Varices, esophageal (HCC)    and gastric per spouse   Past Surgical History:  Procedure Laterality Date   BURR HOLE Bilateral 07/14/2023   Procedure: BURR HOLES;  Surgeon: Venetia Night, MD;  Location: [facility] ORS;  Service: Neurosurgery;  Laterality: Bilateral;   CHOLECYSTECTOMY     COLONOSCOPY N/A 07/23/2019   Procedure: COLONOSCOPY;  Surgeon: Toney Reil, MD;  Location: [facility] ENDOSCOPY;  Service: Gastroenterology;  Laterality: N/A;   ESOPHAGOGASTRODUODENOSCOPY N/A 07/20/2019   Procedure: ESOPHAGOGASTRODUODENOSCOPY (EGD);  Surgeon: Toney Reil, MD;  Location: [facility] ENDOSCOPY;  Service: Gastroenterology;  Laterality: N/A;   ESOPHAGOGASTRODUODENOSCOPY Left 08/20/2019   Procedure: ESOPHAGOGASTRODUODENOSCOPY (EGD);  Surgeon: Pasty Spillers, MD;  Location: [facility] ENDOSCOPY;  Service: Endoscopy;  Laterality: Left;   ESOPHAGOGASTRODUODENOSCOPY (EGD) WITH PROPOFOL N/A 04/25/2021   Procedure: ESOPHAGOGASTRODUODENOSCOPY (EGD) WITH PROPOFOL;  Surgeon: Toney Reil, MD;  Location: [facility] ENDOSCOPY;  Service: Gastroenterology;  Laterality: N/A;   MECHANICAL AORTIC VALVE REPLACEMENT     SKIN BIOPSY     TEE WITHOUT CARDIOVERSION N/A 12/23/2018   Procedure: TRANSESOPHAGEAL ECHOCARDIOGRAM (TEE);  Surgeon: Dalia Heading, MD;  Location: [facility] ORS;  Service: Cardiovascular;  Laterality: N/A;   TEE WITHOUT CARDIOVERSION N/A 05/21/2022   Procedure: TRANSESOPHAGEAL ECHOCARDIOGRAM (TEE);  Surgeon: Antonieta Iba, MD;  Location: [facility] ORS;  Service: Cardiovascular;  Laterality: N/A;   TEE WITHOUT CARDIOVERSION N/A  04/09/2023   Procedure: TRANSESOPHAGEAL ECHOCARDIOGRAM;  Surgeon: Debbe Odea, MD;  Location: [facility] ORS;  Service: Cardiovascular;  Laterality: N/A;    Social History:  reports that he quit smoking about 54 years ago. His smoking use included cigarettes. He started smoking about 59 years ago. He has a 2.5 pack-year smoking history. He has never used smokeless tobacco. He reports that he does not currently use alcohol. He reports that he does not currently use drugs.  Allergies  Allergen Reactions   Camphor Swelling and Rash   Latex Rash   Lisinopril Cough    Other Reaction(s): Cough   Sulfamethoxazole Swelling   Chocolate     Family states he is allergic   Nsaids Other (See Comments)    NASH    Family History  Problem Relation Age of Onset   Heart attack Brother     Prior to Admission medications   Medication Sig Start Date End Date Taking? Authorizing Provider  acetaminophen (TYLENOL) 500 MG tablet Take 1 tablet (500 mg total) by mouth every 6 (six) hours as needed for moderate pain, fever, headache or mild pain (headache). 07/31/23   Gillis Santa, MD  amphetamine-dextroamphetamine (ADDERALL) 5 MG tablet Take 1 tablet (5 mg total) by mouth daily with breakfast. 08/19/23   Alford Highland, MD  atorvastatin (LIPITOR) 20 MG tablet Take 20 mg by mouth at bedtime.    [provider]  butalbital-acetaminophen-caffeine (FIORICET) 50-325-40 MG tablet Take 1 tablet by mouth every 8 (eight) hours as needed for headache. 08/19/23   Alford Highland, MD  Cholecalciferol 50 MCG (2000 UT) TABS Take 1 tablet by mouth daily.    [provider]  clotrimazole (LOTRIMIN) 1 % external solution Apply 1 Application topically 2 (two) times daily. Apply to toe nails 02/12/23   [provider]  famotidine (PEPCID) 20 MG tablet Take 20 mg by mouth daily.    [provider]  feeding supplement (ENSURE ENLIVE / ENSURE PLUS) LIQD Take 237 mLs by mouth 3 (three) times daily between meals. 07/31/23   Gillis Santa, MD  furosemide (LASIX) 20 MG tablet Take 1 tablet (20 mg total) by mouth daily. Patient taking differently: Take 20  mg by mouth daily. Only Monday, Wednesday and Friday 04/07/22   Arnetha Courser, MD  insulin aspart (NOVOLOG) 100 UNIT/ML injection Inject 5 Units into the skin 3 (three) times daily before meals. 08/19/23   Alford Highland, MD  insulin glargine (LANTUS) 100 UNIT/ML injection Inject 0.1 mLs (10 Units total) into the skin at bedtime. 08/19/23   Alford Highland, MD  ketotifen (ZADITOR) 0.025 % ophthalmic solution Place 1 drop into both eyes 2 (two) times daily.    [provider]  Lactobacillus Rhamnosus, GG, (RA PROBIOTIC DIGESTIVE CARE) CAPS Take 1 capsule by mouth daily.    [provider]  lactulose (CHRONULAC) 10 GM/15ML solution Take 15 mLs (10 g total) by mouth 3 (three) times daily. 08/19/23   Alford Highland, MD  levETIRAcetam (KEPPRA) 250 MG tablet Take 1 tablet (250 mg total) by mouth 2 (two) times daily. 07/31/23   Gillis Santa, MD  levothyroxine (SYNTHROID) 100 MCG tablet Take 100 mcg by mouth daily before breakfast.    [provider]  lidocaine (LIDODERM) 5 % Place 2 patches onto the skin daily. Remove & Discard patch within 12 hours or as directed by MD 05/28/22   [name], MD  magnesium oxide (MAG-OX) 400 (240 Mg) MG tablet Take 1 tablet (400 mg total) by mouth daily.  08/19/23   Alford Highland, MD  Multiple Vitamin (MULTIVITAMIN WITH MINERALS) TABS tablet Take 1 tablet by mouth daily. 08/01/23   Gillis Santa, MD  Omega-3 Fatty Acids (FISH OIL) 1000 MG CAPS Take 1 capsule by mouth 2 (two) times daily.    [provider]  pantoprazole (PROTONIX) 40 MG tablet Take 1 tablet (40 mg total) by mouth 2 (two) times daily. 04/07/22 08/13/23  Arnetha Courser, MD  rifaximin (XIFAXAN) 550 MG TABS tablet Take 550 mg by mouth 2 (two) times daily. Takes with lactulose    [provider]  terazosin (HYTRIN) 5 MG capsule Take 5 mg by mouth at bedtime.    [provider]  trolamine salicylate (ASPERCREME) 10 % cream Apply topically as needed for muscle pain.  05/28/22   [name], MD  zinc oxide 20 % ointment Apply 1 Application topically in the morning and at bedtime. APPLY SMALL AMOUNT TO AFFECTED AREA TWICE A DAY FOR BUTTOCK RASH FOR BUTTOCK RASH 05/13/23   [provider]    Physical Exam: Vitals:   09/15/23 0630 09/15/23 0730 09/15/23 0815 09/15/23 0828  BP: 129/70 (!) 141/67 133/68   Pulse: 78 85 79   Resp: 15 14 12    Temp:    98.7 F (37.1 C)  TempSrc:    Oral  SpO2: 100% 100% 98%   Weight:      Height:       Physical Exam Constitutional:      Comments: Underweight    HENT:     Head: Normocephalic and atraumatic.     Nose: Nose normal.     Mouth/Throat:     Mouth: Mucous membranes are dry.  Eyes:     Pupils: Pupils are equal, round, and reactive to light.  Cardiovascular:     Rate and Rhythm: Normal rate and regular rhythm.  Pulmonary:     Effort: Pulmonary effort is normal.  Abdominal:     General: Bowel sounds are normal.  Musculoskeletal:     Comments: + generalized weakness    Skin:    General: Skin is dry.  Neurological:     General: No focal deficit present.  Psychiatric:        Mood and Affect: Mood normal.     Data Reviewed:  There are no new results to review at this time.  CT Cervical Spine Wo Contrast CLINICAL DATA:  77 year old male status post unwitnessed fall. Forehead wound. History of bilateral mixed density subdural hematomas.  EXAM: CT CERVICAL SPINE WITHOUT CONTRAST  TECHNIQUE: Multidetector CT imaging of the cervical spine was performed without intravenous contrast. Multiplanar CT image reconstructions were also generated.  RADIATION DOSE REDUCTION: This exam was performed according to the departmental dose-optimization program which includes automated exposure control, adjustment of the mA and/or kV according to patient size and/or use of iterative reconstruction technique.  COMPARISON:  Head CT today.  Cervical spine CT 07/14/2023.  FINDINGS: Alignment: Stable  straightening of lordosis. Cervicothoracic junction alignment is within normal limits. Bilateral posterior element alignment is within normal limits.  Skull base and vertebrae: Some chronic osteopenia. Visualized skull base is intact. No atlanto-occipital dissociation. C1 and C2 appear severely degenerated, but stable and aligned. Additional degenerative changes described below. No acute osseous abnormality identified.  Soft tissues and spinal canal: No prevertebral fluid or swelling. No visible canal hematoma. Calcified cervical carotid atherosclerosis. Otherwise negative visible noncontrast neck soft tissues.  Disc levels: Cervical spine degeneration superimposed on multilevel chronic degenerative appearing ankylosis including  C2-C3, C4 through C6. Bulky disc osteophyte degeneration at C6-C7. Chronic facet arthropathy at C3-C4. Evidence of chronic degenerative spinal stenosis at both C5-C6 and C6-C7, mild to moderate and stable.  Upper chest: Visible upper thoracic levels appear intact. Stable visible lungs with subpleural reticular opacity, more so the dependent.  IMPRESSION: 1. No acute traumatic injury identified in the cervical spine. 2. 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  Electronically Signed   By: Odessa Fleming M.D.   On: 09/15/2023 04:24 CT HEAD WO CONTRAST ( ) CLINICAL DATA:  77 year old male status post unwitnessed fall. Forehead wound. History of bilateral mixed density subdural hematomas.  EXAM: CT HEAD WITHOUT CONTRAST  TECHNIQUE: Contiguous axial images were obtained from the base of the skull through the vertex without intravenous contrast.  RADIATION DOSE REDUCTION: This exam was performed according to the departmental dose-optimization program which includes automated exposure control, adjustment of the mA and/or kV according to patient size and/or use  of iterative reconstruction technique.  COMPARISON:  Head CT 09/09/2023 and earlier.  FINDINGS: Brain: Ongoing bilateral mixed density subdural hematoma. Hematoma on the left has mildly decreased since 09/09/2023, now 4-5 mm in thickness (previously 6-7 mm at the same level). No increase in hyperdense blood on that side. Contralateral right subdural appears stable with similar scattered hyperdense blood products, 6-7 mm thickness at most levels.  These hematomas were 8-9 mm by MRI in August.  Basilar cisterns remain normal. No ventriculomegaly. Leftward midline shift of 4 mm, stable to slightly improved since August.  No new areas of intracranial hemorrhage identified. Stable gray-white matter differentiation throughout the brain. No cortically based acute infarct identified.  Vascular: Calcified atherosclerosis at the skull base. No suspicious intracranial vascular hyperdensity.  Skull: Chronic bilateral vertex burr holes. No acute fracture or new osseous abnormality identified.  Sinuses/Orbits: Visualized paranasal sinuses and mastoids are clear.  Other: Broad-based left anterior vertex scalp hematoma, up to 8 mm in thickness. Underlying calvarium appears stable, intact. Visualized orbit soft tissues are within normal limits.  IMPRESSION: 1. Ongoing bilateral mixed density subdural hematomas. Stable mild intracranial mass effect. Right subdural is stable, 6-7 mm in thickness. Left subdural decreased from 6-7 mm to now 4-5 mm in thickness.  2. Left anterior vertex scalp hematoma without underlying skull fracture.  3. No new intracranial abnormality.  Electronically Signed   By: Odessa Fleming M.D.   On: 09/15/2023 04:20  Lab Results  Component Value Date   WBC 3.6 (L) 09/15/2023   HGB 10.6 (L) 09/15/2023   HCT 32.0 (L) 09/15/2023   MCV 95.2 09/15/2023   PLT 58 (L) 09/15/2023   Last metabolic panel Lab Results  Component Value Date   GLUCOSE 162 (H) 09/15/2023    NA 138 09/15/2023   K 3.5 09/15/2023   CL 107 09/15/2023   CO2 21 (L) 09/15/2023   BUN 12 09/15/2023   CREATININE 0.76 09/15/2023   GFRNONAA >60 09/15/2023   CALCIUM 9.0 09/15/2023   PHOS 2.7 08/01/2023   PROT 6.4 (L) 09/15/2023   ALBUMIN 2.9 (L) 09/15/2023   BILITOT 2.0 (H) 09/15/2023   ALKPHOS 108 09/15/2023   AST 52 (H) 09/15/2023   ALT 36 09/15/2023   ANIONGAP 10 09/15/2023    Assessment and Plan: Hepatic encephalopathy (HCC) Decompensated hepatic encephalopathy with noted fall  Ammonia level 104  On lactulose and rifaximin  Will increase lactulose  Cont rifaximin  Follow closely   Subdural hematoma (  HCC) + recent fall w/ noted decompensated hepatic encephalopathy  Noted extended hospitalization 7/29-->9/4 for SDH s/p Status post bilateral frontal burr hole and embolization procedure in the [facility] system  CT head today with stable, decreasing SDH  Noted Left anterior vertex scalp hematoma  Fall precautions  Holding [facility]  Follow    Fall Positive fall with noted secondary head trauma in setting of decompensated hepatic encephalopathy Stable subdural hematomas on repeat imaging Fall precautions PT OT evaluation Follow-up  Pancytopenia (HCC) WBC 3.6, hgb 10.6, plt in 50s  Looks to be at baseline  Follow    Chronic diastolic CHF (congestive heart failure) (HCC) 2D ECHO 07/2023 w/ EF 60-65% and grade 1 DD  Appears mildly dry  Gently hydrate  Monitor volume status   HLD (hyperlipidemia) Statin pending CK level   GERD without esophagitis PPI  Hypothyroidism Cont synthroid    Type 2 diabetes mellitus without complications (HCC) SSI   Greater than 50% was spent in counseling and coordination of care with patient Total encounter time 80 minutes or more    Advance Care Planning:   Code Status: Do not attempt resuscitation (DNR) PRE-ARREST INTERVENTIONS DESIRED   Consults: None   Family Communication: Daughter at the bedside   Severity of  Illness: The appropriate patient status for this patient is INPATIENT. Inpatient status is judged to be reasonable and necessary in order to provide the required intensity of service to ensure the patient's safety. The patient's presenting symptoms, physical exam findings, and initial radiographic and laboratory data in the context of their chronic comorbidities is felt to place them at high risk for further clinical deterioration. Furthermore, it is not anticipated that the patient will be medically stable for discharge from the hospital within 2 midnights of admission.   * I certify that at the point of admission it is my clinical judgment that the patient will require inpatient hospital care spanning beyond 2 midnights from the point of admission due to high intensity of service, high risk for further deterioration and high frequency of surveillance required.*  Author: Floydene Flock, MD 09/15/2023 9:24 AM  For on call review [email or web address].

## 2023-09-15 NOTE — TOC Initial Note (Addendum)
Transition of Care [facility]) - Initial/Assessment Note    Patient Details  Name: Joshua Berry MRN: [id number] Date of Birth: 1946/08/29  Transition of [facility]) CM/SW Contact:    Marquita Palms, LCSW Phone Number: 09/15/2023, 8:46 AM  Clinical Narrative:                  CSW spoke with Tiffany at [facility] [phone], who reported that the patient is suppose to be going to [employer] or [place] under his [place] benefits. She reports she is waiting a reply from [place] at [facility]. Awaiting call back from Tiffany.        Patient Goals and CMS Choice            Expected Discharge Plan and Services                                              Prior Living Arrangements/Services                       Activities of Daily Living   ADL Screening (condition at time of admission) Does the patient have a NEW difficulty with bathing/dressing/toileting/self-feeding that is expected to last >3 days?: No Does the patient have a NEW difficulty with getting in/out of bed, walking, or climbing stairs that is expected to last >3 days?: No Does the patient have a NEW difficulty with communication that is expected to last >3 days?: No Is the patient deaf or have difficulty hearing?: No Does the patient have difficulty seeing, even when wearing glasses/contacts?: No Does the patient have difficulty concentrating, remembering, or making decisions?: Yes  Permission Sought/Granted                  Emotional Assessment              Admission diagnosis:  Hepatic encephalopathy (HCC) [K76.82] Patient Active Problem List   Diagnosis Date Noted   Right wrist pain 08/14/2023   Bilateral subdural hematomas (HCC) 08/13/2023   Malnutrition of moderate degree 08/13/2023   Decubitus ulcer of coccyx, stage I 08/13/2023   Acute metabolic encephalopathy 08/12/2023   Staphylococcus epidermidis bacteremia 07/23/2023   Acute nontraumatic intracranial  subdural hematoma (HCC) 07/15/2023   Subdural hematoma (HCC) 07/14/2023   Compression of brain (HCC) 07/14/2023   Uncal herniation (HCC) 07/14/2023   Type 2 diabetes mellitus without complications (HCC) 06/17/2023   GERD without esophagitis 06/17/2023   Dyslipidemia 06/17/2023   Pressure injury of skin 06/17/2023   Acute urinary retention 04/11/2023   Streptococcal bacteremia 04/07/2023   Altered mental status 04/06/2023   Hepatic encephalopathy (HCC) 04/05/2023   Overweight (BMI 25.0-29.9) 08/03/2022   Hyponatremia, hypokalemia and hypomagnesemia 05/18/2022   Hyponatremia 05/18/2022   Hypokalemia 05/18/2022   Back pain 05/17/2022   Hypomagnesemia 05/17/2022   Acute hepatic encephalopathy (HCC) 05/17/2022   AKI (acute kidney injury) (HCC) 05/16/2022   Elevated liver enzymes 05/16/2022   Hypotension 05/16/2022   Right shoulder pain 05/16/2022   Hypothyroidism 05/16/2022   Physical deconditioning 05/16/2022   Obesity (BMI 30-39.9) 05/16/2022   Lactic acidosis 05/16/2022   Fall at home, initial encounter 05/16/2022   Septic shock due to Staphylococcus bacteremia 05/14/2022   Chest pain 04/06/2022   Controlled IDDM-2 with hyperglycemia 04/06/2022   HLD (hyperlipidemia) 04/06/2022   Chronic diastolic CHF (congestive  heart failure) (HCC) 04/06/2022   Pancytopenia (HCC) 04/06/2022   GI bleeding 04/24/2021   Cirrhosis of liver without ascites (HCC)    Secondary esophageal varices without bleeding (HCC)    Portal hypertension (HCC)    Stomach irritation    Melena    Acute gastrointestinal hemorrhage 07/19/2019   Severe sepsis (HCC) 12/20/2018   PCP:  [facility] Pharmacy:   [facility] - [place], [place] - [address] [place] [address] Phone: [phone] Fax: [phone]     Social Determinants of Health (SDOH) Social History: SDOH Screenings   Food Insecurity: Patient Unable To Answer (09/15/2023)  Housing: Patient Unable To  Answer (09/15/2023)  Transportation Needs: Patient Unable To Answer (09/15/2023)  Recent Concern: Transportation Needs - Unmet Transportation Needs (06/17/2023)  Utilities: Patient Unable To Answer (09/15/2023)  Recent Concern: Utilities - At Risk (06/17/2023)  Depression (PHQ2-9): Low Risk  (04/24/2023)  Financial Resource Strain: Low Risk  (08/06/2023)   Received from [facility]  Social Connections: Unknown (04/29/2022)   Received from [facility]  Tobacco Use: Medium Risk (09/15/2023)   SDOH Interventions:     Readmission Risk Interventions    08/14/2023    2:18 PM 07/15/2023    8:52 AM 06/18/2023   10:19 AM  Readmission Risk Prevention Plan  Transportation Screening Complete Complete Complete  PCP or Specialist Appt within 5-7 Days   Complete  PCP or Specialist Appt within 3-5 Days Complete Complete   Home Care Screening   Complete  Medication Review (RN CM)   Complete  HRI or Home Care Consult Complete    Social Work Consult for Recovery Care Planning/Counseling Complete Complete   Palliative Care Screening Not Applicable Not Applicable   Medication Review Oceanographer) Complete Referral to Pharmacy

## 2023-09-15 NOTE — Assessment & Plan Note (Signed)
SSI

## 2023-09-15 NOTE — ED Notes (Signed)
Pharmacy messaged for pt rifaximin

## 2023-09-15 NOTE — Progress Notes (Addendum)
Per the daughter, has concerned about patient with change in mentation. Noted admission with decompensated hepatic encephalopathy and fall with noted prior subdural hematomas Patient evaluated at the bedside with some improvement in mentation.  Is able to answer some questions.  Appears to be somewhat more alert.  Is complaining of mild headache on the top of the scalp consistent with fall earlier. Daughter also complained about patient having abrasion on left knee status post fall. Left knee is nontender Will check left knee plain films to be complete Repeat CTA head x 1 ordered Dr. Amada Jupiter with neurology also contacted for nonurgent evaluation at the request of family. Discussed overall plan of care with daughter at the bedside.  Will otherwise continue to follow closely

## 2023-09-15 NOTE — Assessment & Plan Note (Signed)
2D ECHO 07/2023 w/ EF 60-65% and grade 1 DD  Appears mildly dry  Gently hydrate  Monitor volume status

## 2023-09-15 NOTE — Assessment & Plan Note (Addendum)
+   recent fall w/ noted decompensated hepatic encephalopathy  Noted extended hospitalization 7/29-->9/4 for SDH s/p Status post bilateral frontal burr hole and embolization procedure in the [facility] system  CT head today with stable, decreasing SDH  Noted Left anterior vertex scalp hematoma  Fall precautions  Holding [facility]  Follow

## 2023-09-15 NOTE — Assessment & Plan Note (Signed)
Cont synthroid

## 2023-09-16 DIAGNOSIS — G9341 Metabolic encephalopathy: Secondary | ICD-10-CM

## 2023-09-16 DIAGNOSIS — K7682 Hepatic encephalopathy: Principal | ICD-10-CM

## 2023-09-16 LAB — CBC
HCT: 27.6 % — ABNORMAL LOW (ref 39.0–52.0)
Hemoglobin: 9.9 g/dL — ABNORMAL LOW (ref 13.0–17.0)
MCH: 32.7 pg (ref 26.0–34.0)
MCHC: 35.9 g/dL (ref 30.0–36.0)
MCV: 91.1 fL (ref 80.0–100.0)
Platelets: 52 10*3/uL — ABNORMAL LOW (ref 150–400)
RBC: 3.03 MIL/uL — ABNORMAL LOW (ref 4.22–5.81)
RDW: 17 % — ABNORMAL HIGH (ref 11.5–15.5)
WBC: 2.8 10*3/uL — ABNORMAL LOW (ref 4.0–10.5)
nRBC: 0 % (ref 0.0–0.2)

## 2023-09-16 LAB — COMPREHENSIVE METABOLIC PANEL
ALT: 35 U/L (ref 0–44)
AST: 52 U/L — ABNORMAL HIGH (ref 15–41)
Albumin: 2.5 g/dL — ABNORMAL LOW (ref 3.5–5.0)
Alkaline Phosphatase: 109 U/L (ref 38–126)
Anion gap: 7 (ref 5–15)
BUN: 9 mg/dL (ref 8–23)
CO2: 20 mmol/L — ABNORMAL LOW (ref 22–32)
Calcium: 8.7 mg/dL — ABNORMAL LOW (ref 8.9–10.3)
Chloride: 109 mmol/L (ref 98–111)
Creatinine, Ser: 0.57 mg/dL — ABNORMAL LOW (ref 0.61–1.24)
GFR, Estimated: >60 mL/min (ref >60)
Glucose, Bld: 133 mg/dL — ABNORMAL HIGH (ref 70–99)
Potassium: 3.5 mmol/L (ref 3.5–5.1)
Sodium: 136 mmol/L (ref 135–145)
Total Bilirubin: 2 mg/dL — ABNORMAL HIGH (ref 0.3–1.2)
Total Protein: 5.9 g/dL — ABNORMAL LOW (ref 6.5–8.1)

## 2023-09-16 LAB — GLUCOSE, CAPILLARY
Glucose-Capillary: 185 mg/dL — ABNORMAL HIGH (ref 70–99)
Glucose-Capillary: 191 mg/dL — ABNORMAL HIGH (ref 70–99)
Glucose-Capillary: 179 mg/dL — ABNORMAL HIGH (ref 70–99)
Glucose-Capillary: 145 mg/dL — ABNORMAL HIGH (ref 70–99)
Glucose-Capillary: 147 mg/dL — ABNORMAL HIGH (ref 70–99)

## 2023-09-16 LAB — PHOSPHORUS: Phosphorus: 3.4 mg/dL (ref 2.5–4.6)

## 2023-09-16 LAB — AMMONIA: Ammonia: 85 umol/L — ABNORMAL HIGH (ref 9–35)

## 2023-09-16 LAB — MAGNESIUM: Magnesium: 1.5 mg/dL — ABNORMAL LOW (ref 1.7–2.4)

## 2023-09-16 LAB — MRSA NEXT GEN BY PCR, NASAL: MRSA by PCR Next Gen: NOT DETECTED

## 2023-09-16 MED ORDER — LEVOTHYROXINE SODIUM 100 MCG PO TABS
100.0000 ug | ORAL_TABLET | Freq: Every day | ORAL | Status: DC
  Administered 2023-09-17 – 2023-09-25 (×9): 100 ug via ORAL
  Filled 2023-09-16 (×9): qty 1

## 2023-09-16 MED ORDER — INSULIN ASPART 100 UNIT/ML IJ SOLN
0.0000 [IU] | Freq: Every day | INTRAMUSCULAR | Status: DC
  Administered 2023-09-18 – 2023-09-21 (×3): 2 [IU] via SUBCUTANEOUS
  Administered 2023-09-22: 4 [IU] via SUBCUTANEOUS
  Administered 2023-09-23 – 2023-09-24 (×2): 2 [IU] via SUBCUTANEOUS
  Filled 2023-09-16 (×6): qty 1

## 2023-09-16 MED ORDER — MELATONIN 5 MG PO TABS
5.0000 mg | ORAL_TABLET | Freq: Every day | ORAL | Status: DC
  Administered 2023-09-16 – 2023-09-24 (×10): 5 mg via ORAL
  Filled 2023-09-16 (×10): qty 1

## 2023-09-16 MED ORDER — INSULIN ASPART 100 UNIT/ML IJ SOLN
0.0000 [IU] | Freq: Three times a day (TID) | INTRAMUSCULAR | Status: DC
  Administered 2023-09-16: 2 [IU] via SUBCUTANEOUS
  Administered 2023-09-17: 3 [IU] via SUBCUTANEOUS
  Administered 2023-09-17: 2 [IU] via SUBCUTANEOUS
  Administered 2023-09-17: 3 [IU] via SUBCUTANEOUS
  Administered 2023-09-18: 2 [IU] via SUBCUTANEOUS
  Administered 2023-09-18: 3 [IU] via SUBCUTANEOUS
  Administered 2023-09-18: 2 [IU] via SUBCUTANEOUS
  Administered 2023-09-19: 5 [IU] via SUBCUTANEOUS
  Administered 2023-09-19: 1 [IU] via SUBCUTANEOUS
  Administered 2023-09-20: 3 [IU] via SUBCUTANEOUS
  Administered 2023-09-20: 1 [IU] via SUBCUTANEOUS
  Administered 2023-09-20: 3 [IU] via SUBCUTANEOUS
  Administered 2023-09-21: 2 [IU] via SUBCUTANEOUS
  Administered 2023-09-21: 3 [IU] via SUBCUTANEOUS
  Administered 2023-09-21: 5 [IU] via SUBCUTANEOUS
  Administered 2023-09-22: 2 [IU] via SUBCUTANEOUS
  Administered 2023-09-22: 9 [IU] via SUBCUTANEOUS
  Administered 2023-09-22: 5 [IU] via SUBCUTANEOUS
  Administered 2023-09-23: 1 [IU] via SUBCUTANEOUS
  Administered 2023-09-23: 3 [IU] via SUBCUTANEOUS
  Administered 2023-09-23: 5 [IU] via SUBCUTANEOUS
  Administered 2023-09-24: 3 [IU] via SUBCUTANEOUS
  Administered 2023-09-24: 2 [IU] via SUBCUTANEOUS
  Administered 2023-09-24: 5 [IU] via SUBCUTANEOUS
  Administered 2023-09-25: 2 [IU] via SUBCUTANEOUS
  Administered 2023-09-25: 5 [IU] via SUBCUTANEOUS
  Filled 2023-09-16 (×27): qty 1

## 2023-09-16 MED ORDER — THIAMINE MONONITRATE 100 MG PO TABS
100.0000 mg | ORAL_TABLET | Freq: Every day | ORAL | Status: DC
  Administered 2023-09-16 – 2023-09-25 (×10): 100 mg via ORAL
  Filled 2023-09-16 (×10): qty 1

## 2023-09-16 MED ORDER — POTASSIUM CHLORIDE CRYS ER 20 MEQ PO TBCR
40.0000 meq | EXTENDED_RELEASE_TABLET | Freq: Once | ORAL | Status: AC
  Administered 2023-09-16: 40 meq via ORAL
  Filled 2023-09-16: qty 2

## 2023-09-16 MED ORDER — MAGNESIUM SULFATE 4 GM/100ML IV SOLN
4.0000 g | Freq: Once | INTRAVENOUS | Status: AC
  Administered 2023-09-16: 4 g via INTRAVENOUS
  Filled 2023-09-16: qty 100

## 2023-09-16 NOTE — TOC Progression Note (Signed)
Transition of Care [facility]) - Progression Note    Patient Details  Name: Joshua Berry MRN: [id number] Date of Birth: June 24, 1946  [facility] of [facility] CM/SW Contact  Allena Katz, LCSW Phone Number: 09/16/2023, 4:04 PM  Clinical Narrative:   CSW received VM from [facility] to return call about pt admitting there. CSW LVM with deborah.          Expected Discharge Plan and Services                                               Social Determinants of Health (SDOH) Interventions SDOH Screenings   Food Insecurity: Patient Unable To Answer (09/15/2023)  Housing: Patient Unable To Answer (09/15/2023)  Transportation Needs: Patient Unable To Answer (09/15/2023)  Recent Concern: Transportation Needs - Unmet Transportation Needs (06/17/2023)  Utilities: Patient Unable To Answer (09/15/2023)  Recent Concern: Utilities - At Risk (06/17/2023)  Depression (PHQ2-9): Low Risk  (04/24/2023)  Financial Resource Strain: Low Risk  (08/06/2023)   Received from [facility]  Social Connections: Unknown (04/29/2022)   Received from [facility]  Tobacco Use: Medium Risk (09/15/2023)    Readmission Risk Interventions    09/15/2023    8:54 AM 08/14/2023    2:18 PM 07/15/2023    8:52 AM  Readmission Risk Prevention Plan  Transportation Screening Complete Complete Complete  PCP or Specialist Appt within 3-5 Days Complete Complete Complete  HRI or Home Care Consult Complete Complete   Social Work Consult for Recovery Care Planning/Counseling Not Complete Complete Complete  Palliative Care Screening Not Applicable Not Applicable Not Applicable  Medication Review Oceanographer) Not Complete Complete Referral to Pharmacy  Med Review Comments Patient confused and disoriented.

## 2023-09-16 NOTE — Evaluation (Signed)
Occupational Therapy Evaluation Patient Details Name: Joshua Berry MRN: [id number] DOB: [birth year] Today's Date: 09/16/2023   History of Present Illness [name] is a 77 y.o. male with medical history significant of NASH, liver cirrhosis, hepatic encephalopathy, esophageal varices, GI bleeding, anemia, thrombocytopenia, HTN, HLD, DM, dCHF, hypothyroidism, OSA not on CPAP, melanoma, s/p TAVR, recent admission due to subdural hematoma, MRSE bacteremia presenting with fall and hepatic encephalopathy.  Noted recent extended admission from August through September 2024 for issues including subdural hematoma, staphylococcal epidermidis bacteremia.   Clinical Impression   Joshua Berry was seen for OT/PT evaluation this date. Prior to hospital admission, pt was at [facility] walking with RW and +1 assist in therapy sessions. Pt currently requires MAX A pericare rolling at bed level. MIN-MOD A x2 + RW for sit<>stand and bed>chair t/f. Pt would benefit from skilled OT to address noted impairments and functional limitations (see below for any additional details). Upon hospital discharge, recommend OT follow up <3 hours/day.    If plan is discharge home, recommend the following: Two people to help with walking and/or transfers;A lot of help with bathing/dressing/bathroom;Help with stairs or ramp for entrance    Functional Status Assessment  Patient has had a recent decline in their functional status and demonstrates the ability to make significant improvements in function in a reasonable and predictable amount of time.  Equipment Recommendations  Other (comment) (defer)    Recommendations for Other Services       Precautions / Restrictions Precautions Precautions: Fall Restrictions Weight Bearing Restrictions: No      Mobility Bed Mobility Overal bed mobility: Needs Assistance Bed Mobility: Rolling, Supine to Sit Rolling: Max assist   Supine to sit: Mod assist, +2 for physical assistance           Transfers Overall transfer level: Needs assistance Equipment used: Rolling walker (2 wheels) Transfers: Sit to/from Stand Sit to Stand: Mod assist, +2 physical assistance                  Balance Overall balance assessment: Needs assistance Sitting-balance support: Feet supported, No upper extremity supported Sitting balance-Leahy Scale: Fair   Postural control: Posterior lean Standing balance support: Bilateral upper extremity supported Standing balance-Leahy Scale: Poor                             ADL either performed or assessed with clinical judgement   ADL Overall ADL's : Needs assistance/impaired                                       General ADL Comments: MAX A pericare rolling at bed level. MIN A x2 + RW for simulated BSC t/f. MAX A don B socks in sitting      Pertinent Vitals/Pain Pain Assessment Pain Assessment: No/denies pain     Extremity/Trunk Assessment Upper Extremity Assessment Upper Extremity Assessment: Generalized weakness   Lower Extremity Assessment Lower Extremity Assessment: Generalized weakness       Communication Communication Communication: No apparent difficulties Cueing Techniques: Verbal cues   Cognition Arousal: Alert Behavior During Therapy: WFL for tasks assessed/performed Overall Cognitive Status: History of cognitive impairments - at baseline  Home Living Family/patient expects to be discharged to:: Skilled nursing facility                                 Additional Comments: Wife states they live at ALF [facility]). Typically, she and pt go down to dining hall for meals and recieve HH PT at their ALF.      Prior Functioning/Environment Prior Level of Function : Needs assist             Mobility Comments: at rehab pt was working on walking with a RW          OT Problem List: Decreased  strength;Decreased range of motion;Decreased activity tolerance;Impaired balance (sitting and/or standing);Decreased safety awareness      OT Treatment/Interventions: Self-care/ADL training;Therapeutic exercise;Energy conservation;DME and/or AE instruction;Therapeutic activities;Balance training;Patient/family education    OT Goals(Current goals can be found in the care plan section) Acute Rehab OT Goals Patient Stated Goal: to return to PLOF OT Goal Formulation: With patient/family Time For Goal Achievement: 09/30/23 Potential to Achieve Goals: Good ADL Goals Pt Will Perform Grooming: with set-up;with supervision;sitting (tolerate 10 mins) Pt Will Perform Lower Body Dressing: with min assist;sit to/from stand Pt Will Transfer to Toilet: with modified independence;ambulating;regular height toilet  OT Frequency: Min 1X/week    Co-evaluation PT/OT/SLP Co-Evaluation/Treatment: Yes Reason for Co-Treatment: For patient/therapist safety;To address functional/ADL transfers PT goals addressed during session: Mobility/safety with mobility OT goals addressed during session: ADL's and self-care      AM-PAC OT "6 Clicks" Daily Activity     Outcome Measure Help from another person eating meals?: None Help from another person taking care of personal grooming?: A Little Help from another person toileting, which includes using toliet, bedpan, or urinal?: A Lot Help from another person bathing (including washing, rinsing, drying)?: A Lot Help from another person to put on and taking off regular upper body clothing?: A Little Help from another person to put on and taking off regular lower body clothing?: A Lot 6 Click Score: 16   End of Session Equipment Utilized During Treatment: Gait belt;Rolling walker (2 wheels)  Activity Tolerance: Patient tolerated treatment well Patient left: in chair;with call bell/phone within reach;with chair alarm set;with family/visitor present  OT Visit Diagnosis:  Other abnormalities of gait and mobility (R26.89);Muscle weakness (generalized) (M62.81)                Time: 1100-1116 OT Time Calculation (min): 16 min Charges:  OT General Charges $OT Visit: 1 Visit OT Evaluation $OT Eval Moderate Complexity: 1 Mod  Kathie Dike, M.S. OTR/L  09/16/23, 1:18 PM  ascom [phone]

## 2023-09-16 NOTE — Evaluation (Signed)
Clinical/Bedside Swallow Evaluation Patient Details  Name: Joshua Berry MRN: [id number] Date of Birth: 19-Aug-1946  Today's Date: 09/16/2023 Time: SLP Start Time (ACUTE ONLY): 0945 SLP Stop Time (ACUTE ONLY): 1040 SLP Time Calculation (min) (ACUTE ONLY): 55 min  Past Medical History:  Past Medical History:  Diagnosis Date   Arthritis    Bleeding ulcer    Cancer (HCC)    melanoma, carcinoma   Cirrhosis of liver (HCC)    Diabetes mellitus without complication (HCC)    Fall    Heart murmur    Hypertension    Iron deficiency anemia    NASH (nonalcoholic steatohepatitis)    Sleep apnea    Spleen enlarged    Thrombocytopenia (HCC)    Varices, esophageal (HCC)    and gastric per spouse   Past Surgical History:  Past Surgical History:  Procedure Laterality Date   BURR HOLE Bilateral 07/14/2023   Procedure: BURR HOLES;  Surgeon: Venetia Night, MD;  Location: [facility] ORS;  Service: Neurosurgery;  Laterality: Bilateral;   CHOLECYSTECTOMY     COLONOSCOPY N/A 07/23/2019   Procedure: COLONOSCOPY;  Surgeon: Toney Reil, MD;  Location: [facility] ENDOSCOPY;  Service: Gastroenterology;  Laterality: N/A;   ESOPHAGOGASTRODUODENOSCOPY N/A 07/20/2019   Procedure: ESOPHAGOGASTRODUODENOSCOPY (EGD);  Surgeon: Toney Reil, MD;  Location: [facility] ENDOSCOPY;  Service: Gastroenterology;  Laterality: N/A;   ESOPHAGOGASTRODUODENOSCOPY Left 08/20/2019   Procedure: ESOPHAGOGASTRODUODENOSCOPY (EGD);  Surgeon: Pasty Spillers, MD;  Location: [facility] ENDOSCOPY;  Service: Endoscopy;  Laterality: Left;   ESOPHAGOGASTRODUODENOSCOPY (EGD) WITH PROPOFOL N/A 04/25/2021   Procedure: ESOPHAGOGASTRODUODENOSCOPY (EGD) WITH PROPOFOL;  Surgeon: Toney Reil, MD;  Location: [facility] ENDOSCOPY;  Service: Gastroenterology;  Laterality: N/A;   MECHANICAL AORTIC VALVE REPLACEMENT     SKIN BIOPSY     TEE WITHOUT CARDIOVERSION N/A 12/23/2018   Procedure: TRANSESOPHAGEAL ECHOCARDIOGRAM (TEE);  Surgeon: Dalia Heading, MD;  Location: [facility] ORS;  Service: Cardiovascular;  Laterality: N/A;   TEE WITHOUT CARDIOVERSION N/A 05/21/2022   Procedure: TRANSESOPHAGEAL ECHOCARDIOGRAM (TEE);  Surgeon: Antonieta Iba, MD;  Location: [facility] ORS;  Service: Cardiovascular;  Laterality: N/A;   TEE WITHOUT CARDIOVERSION N/A 04/09/2023   Procedure: TRANSESOPHAGEAL ECHOCARDIOGRAM;  Surgeon: Debbe Odea, MD;  Location: [facility] ORS;  Service: Cardiovascular;  Laterality: N/A;   HPI:  Pt is a 77 y.o. male with medical history significant of NASH, liver cirrhosis, Esophageal varices, hepatic encephalopathy, esophageal varices, GI bleeding, anemia, thrombocytopenia, HTN, HLD, DM, dCHF, hypothyroidism, OSA not on CPAP, melanoma, s/p of TAVR, recent admission due to subdural hematoma, MRSE bacteremia presenting with fall and hepatic encephalopathy.  Limited history in the setting of encephalopathy.  Per report, patient with fall at local rehab facility.  Noted recent extended admission from August through September 2024 for issues including subdural hematoma w/ burr hole tx, staphylococcal epidermidis bacteremia.  Had protracted course in the [facility] system for for issues including middle meningeal artery embolization.  Per report, patient with noted fall at the rehab.  Daughter reports worsening confusion over the past 2 days.  No fevers or chills.  No nausea or vomiting.  No reported chest pain no shortness of breath.   CT Head with ongoing bilateral mixed density subdural hematomas stable mild intracranial mass effect with right subdural being stable and left subdural being decreased.  No CXR imaging.    Assessment / Plan / Recommendation  Clinical Impression   Pt seen today for BSE today. Pt awake, resting in bed. Family present in room. Pt often had  eyes closed. There is some question re: Cognitive change/decline w/ recent hospitalizations/Rehab per Family -- pt has required more assistance w/ ADLs per report. Pt Slow to respond at  times; smiling to questions.  On RA, afebrile. WBC 2.8.  Pt was seen by ST services during last(recent) admit. Pt rec'd a Dys. 2 w/ thins d/t oral phase and mental status presentations then. Pt requires feeding support d/t current mental status, weakness, and decreased awareness.     Pt appears to present w/ grossly functional oropharyngeal phase swallowing w/ the soft solid, moistened foods, thin liquids diet w/ No overt pharyngeal phase dysphagia noted, No sensorimotor deficits noted. Oral phase appeared min slower for mastication of increased textured boluses -- similar to previous evaluations. Pt is Missing MOST Dentition and requires support w/ feeding/cues. Pt consumed po trials w/ No overt, clinical s/s of aspiration during po trials.  Pt appears at reduced risk for aspiration following general aspiration precautions using a modified diet consistency.  However, pt does have challenging factors that could impact oropharyngeal swallowing to include Acute/lengthy and Chronic illness, fatigue/weakness/deconditioning, and need for support w/ feeding at meals. These factors can increase risk for aspiration, dysphagia as well as decreased oral intake overall.  Pt appears to present w/ more of a mental status impact on his alertness and desire for oral intake; not an oropharyngeal phase swallowing deficit.   During po trials, pt consumed all consistencies w/ no overt coughing, decline in vocal quality, or change in respiratory presentation during/post trials. No decline in O2 sats post oral intake. Oral phase appeared grossly [facility] w/ timely bolus management and control of bolus propulsion for A-P transfer for swallowing. Min prolonged mastication time/effort noted w/ soft solids; suspect impact of increased texture w/ Missing MOST Dentition. Oral clearing was achieved w/ all trial consistencies w/ Time and moistened foods given.  OM Exam appeared [facility] w/ no unilateral weakness noted. Speech Clear; soft voice  when he spoke. Pt helped to feed self by holding Cup to drink which improves safety of swallowing. Cues required.    Recommend a more Mech Soft diet w/ MEATS MINCED and well-moistened foods for ease of mastication(mashing/gumming); Thin liquids -- carefully monitor Cup drinking and pt should help to Hold Cup when drinking; No Straws. Recommend general aspiration precautions, tray setup and support sitting fully Upright w/ all oral intake. Reduce distractions/talking during meals. Feeding support as needed. Pills WHOLE vs CRUSHED in Puree for safer, easier swallowing-- recommend for now and for D/C to the Family. Reflux precautions. Education given on Pills in Puree; food consistencies and easy to eat options; general aspiration precautions to pt and Family present.    No further skilled ST services indicated at this time as pt appears to present at a Baseline similar to last assessments. Family agreed. NSG and MD updated, agreed. Family/pt agreed w/ above.  Recommend Dietician and Palliative Care f/u for support. SLP Visit Diagnosis: Dysphagia, oral phase (R13.11) (mixture of Missing MOST Dentition and Cognitive decline; need for cues/support w/ self-feeding too)    Aspiration Risk   (reduced following general aspiration precautions)    Diet Recommendation   Thin;Dysphagia 3 (mechanical soft) (meats MINCED; moistened foods) = a more Mech Soft diet w/ MEATS MINCED and well-moistened foods for ease of mastication(mashing/gumming); Thin liquids -- carefully monitor Cup drinking and pt should help to Hold Cup when drinking; No Straws. Recommend general aspiration precautions, tray setup and support sitting fully Upright w/ all oral intake. Reduce distractions/talking during meals.  Feeding support as needed.   Medication Administration: Whole meds with puree (vs need to Crush in puree)    Other  Recommendations Recommended Consults:  (Dietician; Palliative Care) Oral Care Recommendations: Oral care  BID;Oral care before and after PO;Staff/trained caregiver to provide oral care    Recommendations for follow up therapy are one component of a multi-disciplinary discharge planning process, led by the attending physician.  Recommendations may be updated based on patient status, additional functional criteria and insurance authorization.  Follow up Recommendations No SLP follow up      Assistance Recommended at Discharge  FULL  Functional Status Assessment Patient has had a recent decline in their functional status and demonstrates the ability to make significant improvements in function in a reasonable and predictable amount of time.  Frequency and Duration  (n/a)   (n/a)       Prognosis Prognosis for improved oropharyngeal function: Fair (-good) Barriers to Reach Goals: Cognitive deficits;Time post onset;Severity of deficits Barriers/Prognosis Comment: mixture of Missing MOST Dentition and Cognitive decline; need for cues/support w/ self-feeding too      Swallow Study   General Date of Onset: 09/15/23 HPI: Pt is a 77 y.o. male with medical history significant of NASH, liver cirrhosis, Esophageal varices, hepatic encephalopathy, esophageal varices, GI bleeding, anemia, thrombocytopenia, HTN, HLD, DM, dCHF, hypothyroidism, OSA not on CPAP, melanoma, s/p of TAVR, recent admission due to subdural hematoma, MRSE bacteremia presenting with fall and hepatic encephalopathy.  Limited history in the setting of encephalopathy.  Per report, patient with fall at local rehab facility.  Noted recent extended admission from August through September 2024 for issues including subdural hematoma, staphylococcal epidermidis bacteremia.  Had protracted course in the [facility] system for for issues including middle meningeal artery embolization.  Per report, patient with noted fall at the rehab.  Daughter reports worsening confusion over the past 2 days.  No fevers or chills.  No nausea or vomiting.  No reported chest  pain no shortness of breath.   CT Head with ongoing bilateral mixed density subdural hematomas stable mild intracranial mass effect with right subdural being stable and left subdural being decreased.  No CXR imaging. Type of Study: Bedside Swallow Evaluation Previous Swallow Assessment: 8/1; 8/4; 07/23/23 - dys 2 w/ thins Diet Prior to this Study: Dysphagia 2 (finely chopped);Thin liquids (Level 0) Temperature Spikes Noted: No (wbc 2.8) Respiratory Status: Room air History of Recent Intubation: No Behavior/Cognition: Alert;Cooperative;Pleasant mood;Confused;Distractible;Requires cueing (eyes closed often; chuckling/smiling responses) Oral Cavity Assessment: Within Functional Limits Oral Care Completed by SLP: Recent completion by staff Oral Cavity - Dentition: Missing dentition (MOST) Vision: Functional for self-feeding Self-Feeding Abilities: Able to feed self;Needs assist;Needs set up;Total assist Patient Positioning: Upright in bed (fully supported) Baseline Vocal Quality: Normal Volitional Cough: Strong    Oral/Motor/Sensory Function Overall Oral Motor/Sensory Function: Within functional limits   Ice Chips Ice chips: Within functional limits Presentation: Spoon (fed; 2 trials)   Thin Liquid Thin Liquid: Within functional limits Presentation: Cup;Self Fed (~8 ozs total)    Nectar Thick Nectar Thick Liquid: Not tested   Honey Thick Honey Thick Liquid: Not tested   Puree Puree: Within functional limits Presentation: Spoon (supported; 8 trials)   Solid     Solid: Impaired (missing dentition) Presentation: Spoon (fed; 6 trials) Oral Phase Impairments: Impaired mastication (lengthier time d/t missing dentition) Oral Phase Functional Implications: Impaired mastication Pharyngeal Phase Impairments:  (none)        Jerilynn Som, MS, CCC-SLP Speech Language Pathologist Rehab Services;  [facility] [phone] (ascom) Aerica Rincon 09/16/2023,12:10 PM

## 2023-09-16 NOTE — Progress Notes (Signed)
[facility] Progress Note  [name]    YNW:[id number]  DOA: 09/15/2023     Date of Service: the patient was seen and examined on 09/16/2023  No chief complaint on file.  Brief hospital course: TELVIN REINDERS is a 77 y.o. male with medical history significant of NASH, liver cirrhosis, hepatic encephalopathy, esophageal varices, GI bleeding, anemia, thrombocytopenia, HTN, HLD, DM, dCHF, hypothyroidism, OSA not on CPAP, melanoma, s/p of TAVR, recent admission due to subdural hematoma, MRSE bacteremia presenting with fall and hepatic encephalopathy.  Limited history in the setting of encephalopathy.  Per report, patient with fall at local rehab facility.  Noted recent extended admission from August through September 2024 for issues including subdural hematoma, staphylococcal epidermidis bacteremia.  Had protracted course in the [facility] system for for issues including middle meningeal artery embolization.  Per report, patient with noted fall at the rehab.  Daughter reports worsening confusion over the past 2 days.    ED w/up afebrile, hemodynamically stable. White count 3.6, hemoglobin 10.6, platelets 58, urinalysis within normal limits, creatinine 0.76, glucose 162, T. bili 2. INR 1.4. Ammonia level 102. EKG normal sinus rhythm. CT head with ongoing bilateral mixed density subdural hematomas stable mild intracranial mass effect with right subdural being stable and left subdural being decreased. Left anterior vertex scalp hematoma.   Assessment and Plan: # Hepatic encephalopathy  Decompensated hepatic encephalopathy with noted fall  Ammonia level 104 --85 trending down On lactulose and rifaximin  increased lactulose  Cont rifaximin  Follow closely     # Subdural hematoma (HCC) + recent fall w/ noted decompensated hepatic encephalopathy  Noted extended hospitalization 7/29-->9/4 for SDH s/p Status post bilateral frontal burr hole and embolization procedure in the [facility] system  CT head  with stable, decreasing SDH  Noted Left anterior vertex scalp hematoma  Fall precautions  Holding [facility]  Follow neurosurgery for further recommendation     # Fall Positive fall with noted secondary head trauma in setting of decompensated hepatic encephalopathy Stable subdural hematomas on repeat imaging Check orthostatic vital signs when able to Fall precautions PT OT evaluation Follow-up   # Hypomagnesemia, mag repleted. Monitor electrolytes daily.  # Pancytopenia secondary to NASH liver cirrhosis WBC 3.6, hgb 10.6, plt in 50s  Looks to be at baseline  Follow      Chronic diastolic CHF (congestive heart failure) (HCC) 2D ECHO 07/2023 w/ EF 60-65% and grade 1 DD  Appears mildly dry  Gently hydrate  Monitor volume status    HLD (hyperlipidemia) Statin pending CK level    GERD without esophagitis PPI   Hypothyroidism, TSH 2.8 wnl Cont synthroid      Type 2 diabetes mellitus without complications (HCC) SSI   Body mass index is 27.96 kg/m.  Interventions:  Pressure Injury 06/17/23 Coccyx Mid Stage 2 -  Partial thickness loss of dermis presenting as a shallow open injury with a red, pink wound bed without slough. (Active)  06/17/23 0615  Location: Coccyx  Location Orientation: Mid  Staging: Stage 2 -  Partial thickness loss of dermis presenting as a shallow open injury with a red, pink wound bed without slough.  Wound Description (Comments):   Present on Admission: Yes     Pressure Injury 07/18/23 Buttocks Left;Right Stage 1 -  Intact skin with non-blanchable redness of a localized area usually over a bony prominence. (Active)  07/18/23 1450  Location: Buttocks  Location Orientation: Left;Right  Staging: Stage 1 -  Intact skin with non-blanchable  redness of a localized area usually over a bony prominence.  Wound Description (Comments):   Present on Admission: Yes     Diet: Dysphagia 3 diet DVT Prophylaxis: SCD, pharmacological prophylaxis contraindicated due to  subdural hematoma    Advance goals of care discussion: DNR  Family Communication: family was present at bedside, at the time of interview.  The pt provided permission to discuss medical plan with the family. Opportunity was given to ask question and all questions were answered satisfactorily.   Disposition:  Pt is from SNF, admitted with fall and hepatic encephalopathy, still has AMS, which precludes a safe discharge. Discharge to SNF, when stable.  Subjective: No significant events overnight, patient is AO x 2, still slightly confused, denies any specific complaints.  Laying comfortably.  Physical Exam: General: NAD, lying comfortably Appear in no distress, affect appropriate Eyes: PERRLA ENT: Oral Mucosa Clear, moist  Neck: no JVD,  Cardiovascular: S1 and S2 Present, no Murmur,  Respiratory: good respiratory effort, Bilateral Air entry equal and Decreased, no Crackles, no wheezes Abdomen: Bowel Sound present, Soft and no tenderness,  Skin: no rashes Extremities: no Pedal edema, no calf tenderness Neurologic: without any new focal findings Gait not checked due to patient safety concerns  Vitals:   09/16/23 0521 09/16/23 0522 09/16/23 0749 09/16/23 1235  BP: 128/77 128/77 122/73 (!) 129/59  Pulse: 72 75 68 66  Resp: 18 18 18 16   Temp: 98.7 F (37.1 C) 98.7 F (37.1 C) 97.9 F (36.6 C) 98.6 F (37 C)  TempSrc: Oral Oral    SpO2: 99% 98% 100% 100%  Weight:      Height:        Intake/Output Summary (Last 24 hours) at 09/16/2023 1440 Last data filed at 09/15/2023 2123 Gross per 24 hour  Intake 431.25 ml  Output 450 ml  Net -18.75 ml   Filed Weights   09/15/23 0347  Weight: 98.8 kg    Data Reviewed: I have personally reviewed and interpreted daily labs, tele strips, imagings as discussed above. I reviewed all nursing notes, pharmacy notes, vitals, pertinent old records I have discussed plan of care as described above with RN and patient/family.  CBC: Recent Labs   Lab 09/15/23 0354 09/16/23 0539  WBC 3.6* 2.8*  NEUTROABS 2.2  --   HGB 10.6* 9.9*  HCT 32.0* 27.6*  MCV 95.2 91.1  PLT 58* 52*   Basic Metabolic Panel: Recent Labs  Lab 09/15/23 0354 09/16/23 0539  NA 138 136  K 3.5 3.5  CL 107 109  CO2 21* 20*  GLUCOSE 162* 133*  BUN 12 9  CREATININE 0.76 0.57*  CALCIUM 9.0 8.7*  MG  --  1.5*  PHOS  --  3.4    Studies: CT HEAD WO CONTRAST ( )  Addendum Date: 09/16/2023   ADDENDUM REPORT: 09/16/2023 10:39 ADDENDUM: Impression #2 should state: Increased size of left frontal scalp hematoma. Electronically Signed   By: Lorenza Cambridge M.D.   On: 09/16/2023 10:39   Result Date: 09/16/2023 CLINICAL DATA:  Subdural hematoma follow-up EXAM: CT HEAD WITHOUT CONTRAST TECHNIQUE: Contiguous axial images were obtained from the base of the skull through the vertex without intravenous contrast. RADIATION DOSE REDUCTION: This exam was performed according to the departmental dose-optimization program which includes automated exposure control, adjustment of the mA and/or kV according to patient size and/or use of iterative reconstruction technique. COMPARISON:  09/15/2023 at 4 a.m. FINDINGS: Brain: Unchanged appearance of right convexity mixed density subdural hematoma, predominantly hypodense.  4 mm leftward midline shift is unchanged. Smaller left convexity chronic subdural hematoma is also unchanged. Vascular: Negative Skull: Increased left frontal scalp hematoma. No skull fracture. Bilateral anterior burr holes. Sinuses/Orbits: No acute finding. Other: None. IMPRESSION: 1. Unchanged appearance of bilateral convexity subdural hematomas, right greater than left, with 4 mm of leftward midline shift. 2. Increased left frontal scalp subdural hematoma. Electronically Signed: By: Deatra Robinson M.D. On: 09/16/2023 03:13   DG Knee 1-2 Views Left  Result Date: 09/15/2023 CLINICAL DATA:  Left knee pain following fall, initial encounter EXAM: LEFT KNEE - 2 VIEW  COMPARISON:  None Available. FINDINGS: Tricompartmental degenerative changes are noted. Small joint effusion is seen. No acute fracture or dislocation is noted. IMPRESSION: Degenerative change without acute abnormality. Electronically Signed   By: Alcide Clever M.D.   On: 09/15/2023 21:26    Scheduled Meds:  insulin aspart  0-5 Units Subcutaneous QHS   insulin aspart  0-9 Units Subcutaneous TID WC   ketoconazole   Topical Daily   lactulose  30 g Oral TID   [START ON 09/17/2023] levothyroxine  100 mcg Oral Q0600   melatonin  5 mg Oral QHS   rifaximin  550 mg Oral BID   terazosin  5 mg Oral QHS   thiamine  100 mg Oral Daily   Continuous Infusions:  sodium chloride 75 mL/hr at 09/16/23 0815   PRN Meds: acetaminophen, diphenhydrAMINE, ondansetron **OR** ondansetron (ZOFRAN) IV  Time spent: 35 minutes  Author: Gillis Santa. MD [facility] 09/16/2023 2:40 PM  To reach On-call, see care teams to locate the attending and reach out to them via [email or web address]. If 7PM-7AM, please contact night-coverage If you still have difficulty reaching the attending provider, please page the [facility] (Director on Call) for [facility] on amion for assistance.

## 2023-09-16 NOTE — Evaluation (Signed)
Physical Therapy Evaluation Patient Details Name: Joshua Berry MRN: [id number] DOB: Sep 17, 1946 Today's Date: 09/16/2023  History of Present Illness  Joshua Berry is a 77 y.o. male with medical history significant of NASH, liver cirrhosis, hepatic encephalopathy, esophageal varices, GI bleeding, anemia, thrombocytopenia, HTN, HLD, DM, dCHF, hypothyroidism, OSA not on CPAP, melanoma, s/p TAVR, recent admission due to subdural hematoma, MRSE bacteremia presenting with fall and AMS; admitted for management of hepatic encephalopathy.  Noted recent extended admission from August through September 2024 for issues including subdural hematoma, staphylococcal epidermidis bacteremia.  Clinical Impression  Patient resting in bed upon arrival to room; supportive family (step-daughter) present at bedside.  Patient alert and oriented to self only; follows simple verbal commands, but easily distracted by external environment.  Notable delay in task initiation and overall response time.  Generally weak and deconditioned throughout all extremities; does endorse some pain/soreness to R shoulder (FACES 6/10) with elevation at/past shoulder-height.  Currently requiring mod/max assist +2 for rolling and bed mobility; close sup for unsupported sitting (posterior lean, minimal righting); mod assist +2 for sit/stand and standing balance with RW; mod/max assist +2 for short-distance gait with RW.  Demonstrates forward flexed posture, very narrowed BOS; short, shuffling steps; poor balance, constant manual assist for excessive posterior bias. Minimal/no righting reactions; constant assist for walker position/management and overall initiation of stepping pattern.  Recommend +2 and close chair follow for all progressive mobility efforts. Would benefit from skilled PT to address above deficits and promote optimal return to PLOF.; recommend post-acute PT follow up as indicated by interdisciplinary care team.            If plan is  discharge home, recommend the following: Two people to help with walking and/or transfers;Two people to help with bathing/dressing/bathroom   Can travel by private vehicle   No    Equipment Recommendations    Recommendations for Other Services       Functional Status Assessment Patient has had a recent decline in their functional status and demonstrates the ability to make significant improvements in function in a reasonable and predictable amount of time.     Precautions / Restrictions Precautions Precautions: Fall Restrictions Weight Bearing Restrictions: No      Mobility  Bed Mobility Overal bed mobility: Needs Assistance Bed Mobility: Rolling, Supine to Sit Rolling: Max assist   Supine to sit: Mod assist, +2 for physical assistance          Transfers Overall transfer level: Needs assistance Equipment used: Rolling walker (2 wheels) Transfers: Sit to/from Stand Sit to Stand: Mod assist, +2 physical assistance           General transfer comment: bilat UEs on RW to optimize task comprehension and initiation    Ambulation/Gait Ambulation/Gait assistance: Mod assist, Max assist Gait Distance (Feet): 10 Feet Assistive device: Rolling walker (2 wheels)         General Gait Details: forward flexed posture, very narrowed BOS; short, shuffling steps; poor balance, constant manual assist for excessive posterior bias. Minimal/no righting reactions; constant assist for walker position/management and overall initiation of stepping pattern  Stairs            Wheelchair Mobility     Tilt Bed    Modified Rankin (Stroke Patients Only)       Balance Overall balance assessment: Needs assistance Sitting-balance support: No upper extremity supported, Feet supported Sitting balance-Leahy Scale: Fair   Postural control: Posterior lean Standing balance support: Bilateral upper extremity supported Standing  balance-Leahy Scale: Poor                                Pertinent Vitals/Pain Pain Assessment Pain Assessment: Faces Faces Pain Scale: Hurts even more Pain Location: R shoulder Pain Intervention(s): Limited activity within patient's tolerance, Monitored during session, Repositioned    Home Living Family/patient expects to be discharged to:: Assisted living                   Additional Comments: Per OT, Wife states they live at ALF [facility]). Typically, she and pt go down to dining hall for meals and recieve HH PT at their ALF.    Prior Function Prior Level of Function : Needs assist             Mobility Comments: WC level as primary mobility (since SDH); was working with therapy on standing, walking with RW       Extremity/Trunk Assessment   Upper Extremity Assessment Upper Extremity Assessment: Generalized weakness (grossly 3-/5 throughout; bilat elevation limited to grossly shoulder height)    Lower Extremity Assessment Lower Extremity Assessment: Generalized weakness (grossly at least 4-/5 throughout; notable genu varus to bilat LEs)       Communication   Communication Communication: No apparent difficulties Cueing Techniques: Verbal cues  Cognition Arousal: Alert Behavior During Therapy: WFL for tasks assessed/performed Overall Cognitive Status: History of cognitive impairments - at baseline                                 General Comments: delayed response, initiation        General Comments      Exercises     Assessment/Plan    PT Assessment Patient needs continued PT services  PT Problem List Decreased strength;Decreased range of motion;Decreased activity tolerance;Decreased balance;Decreased mobility;Decreased coordination;Decreased cognition;Decreased knowledge of use of DME;Decreased safety awareness;Decreased knowledge of precautions       PT Treatment Interventions DME instruction;Gait training;Functional mobility training;Therapeutic activities;Therapeutic  exercise;Balance training;Neuromuscular re-education;Cognitive remediation;Patient/family education    PT Goals (Current goals can be found in the Care Plan section)  Acute Rehab PT Goals Patient Stated Goal: agreeable to participation with session PT Goal Formulation: With patient/family Time For Goal Achievement: 09/30/23 Potential to Achieve Goals: Fair    Frequency Min 1X/week     Co-evaluation PT/OT/SLP Co-Evaluation/Treatment: Yes Reason for Co-Treatment: For patient/therapist safety;To address functional/ADL transfers PT goals addressed during session: Mobility/safety with mobility OT goals addressed during session: ADL's and self-care       AM-PAC PT "6 Clicks" Mobility  Outcome Measure Help needed turning from your back to your side while in a flat bed without using bedrails?: A Lot Help needed moving from lying on your back to sitting on the side of a flat bed without using bedrails?: A Lot Help needed moving to and from a bed to a chair (including a wheelchair)?: A Lot Help needed standing up from a chair using your arms (e.g., wheelchair or bedside chair)?: A Lot Help needed to walk in hospital room?: A Lot Help needed climbing 3-5 steps with a railing? : Total 6 Click Score: 11    End of Session   Activity Tolerance: Patient tolerated treatment well Patient left: in chair;with call bell/phone within reach;with chair alarm set Nurse Communication: Mobility status PT Visit Diagnosis: Muscle weakness (generalized) (M62.81);Difficulty in walking, not elsewhere classified (  R26.2);Other symptoms and signs involving the nervous system (R29.898)    Time: 1191-4782 PT Time Calculation (min) (ACUTE ONLY): 25 min   Charges:   PT Evaluation $PT Eval Moderate Complexity: 1 Mod   PT General Charges $$ ACUTE PT VISIT: 1 Visit         Miyo Aina H. Manson Passey, PT, DPT, NCS 09/16/23, 1:42 PM [phone]

## 2023-09-16 NOTE — Plan of Care (Signed)

## 2023-09-16 NOTE — Consult Note (Signed)
Neurology Consultation Reason for Consult: Lethargy Referring Physician: Frenchie Blas  CC: Confusion  History is obtained from: daughter, chart  HPI: JONH MCQUEARY is a 77 y.o. male with a history of cirrhosis, who was admitted in July to August with subdural hematomas.  He was transferred to [facility] and underwent middle meningeal artery embolization and had fairly good response to this.  He had some increased lethargy in early September and had a repeat CT scan which showed some improvement.  He had been improving, up till yesterday when he was found down on the floor.  He had an unwitnessed fall, but states that he does remember falling and then has a brief.  He thinks he lost consciousness for.  He notes that he really hit his head.  He has a subgaleal hematoma, but scans of his subdurals show continued improvement.  Of note, his ammonia on Wednesday was 180.    Past Medical History:  Diagnosis Date   Arthritis    Bleeding ulcer    Cancer (HCC)    melanoma, carcinoma   Cirrhosis of liver (HCC)    Diabetes mellitus without complication (HCC)    Fall    Heart murmur    Hypertension    Iron deficiency anemia    NASH (nonalcoholic steatohepatitis)    Sleep apnea    Spleen enlarged    Thrombocytopenia (HCC)    Varices, esophageal (HCC)    and gastric per spouse     Family History  Problem Relation Age of Onset   Heart attack Brother      Social History:  reports that he quit smoking about 54 years ago. His smoking use included cigarettes. He started smoking about 59 years ago. He has a 2.5 pack-year smoking history. He has never used smokeless tobacco. He reports that he does not currently use alcohol. He reports that he does not currently use drugs.   Exam: Current vital signs: BP 122/73 (BP Location: Right Arm)   Pulse 68   Temp 97.9 F (36.6 C)   Resp 18   Ht 6\' 2"  (1.88 m)   Wt 98.8 kg   SpO2 100%   BMI 27.96 kg/m  Vital signs in last 24 hours: Temp:  [97.9 F (36.6  C)-98.7 F (37.1 C)] 97.9 F (36.6 C) (10/01 0749) Pulse Rate:  [68-79] 68 (10/01 0749) Resp:  [14-19] 18 (10/01 0749) BP: (122-146)/(63-77) 122/73 (10/01 0749) SpO2:  [98 %-100 %] 100 % (10/01 0749)   Physical Exam  Appears well-developed and well-nourished.   Neuro: Mental Status: Patient is awake, alert, oriented to person, he is able to give me the year and his age, but unable to give me the month, season, or location. No signs of aphasia or neglect Cranial Nerves: II: Visual Fields are full. Pupils are equal, round, and reactive to light.   III,IV, VI: EOMI without ptosis or diploplia.  V: Facial sensation is symmetric to temperature VII: Facial movement is symmetric.  VIII: hearing is intact to voice X: Uvula elevates symmetrically XI: Shoulder shrug is symmetric. XII: tongue is midline without atrophy or fasciculations.  Motor: Tone is normal. Bulk is normal. 5/5 strength was present in all four extremities.  Potentially very mild and subtle asterixis in bilateral hands, but no large flap Sensory: Sensation is symmetric to light touch and temperature in the arms and legs.   I have reviewed labs in epic and the results pertinent to this consultation are: Ammonia is 85 down from 100  yesterday  I have reviewed the images obtained: CT head appears stable, he does have a extra cranial hematoma  Impression: 77 year old male with a history of subdural hematoma, cirrhosis due to NASH with hepatic encephalopathy, who presents with worsening confusion after a fall.  My suspicion is that he currently has a multifactorial encephalopathy including postconcussive effects, continued effects of the subdural hematoma, hyperammonemia, change in environment.  The fact that he is sundowning would go along with this.  Recommendations: 1) continue treatment for hepatic encephalopathy 2) windows open, lights on during day 3) minimize stimulation at night 4) likely will gradually improve  over the next few weeks 5) will follow   Ritta Slot, MD [facility] [phone]  If 7pm- 7am, please page neurology on call as listed in AMION.

## 2023-09-16 NOTE — Telephone Encounter (Signed)
10/02/2023 with Dr.Yarbrough

## 2023-09-16 NOTE — Progress Notes (Signed)
Per Dr Juliane Lack tele monitoring

## 2023-09-17 DIAGNOSIS — E119 Type 2 diabetes mellitus without complications: Secondary | ICD-10-CM

## 2023-09-17 DIAGNOSIS — S065XAA Traumatic subdural hemorrhage with loss of consciousness status unknown, initial encounter: Secondary | ICD-10-CM | POA: Diagnosis not present

## 2023-09-17 DIAGNOSIS — K7682 Hepatic encephalopathy: Secondary | ICD-10-CM

## 2023-09-17 DIAGNOSIS — D61818 Other pancytopenia: Secondary | ICD-10-CM | POA: Diagnosis not present

## 2023-09-17 DIAGNOSIS — K219 Gastro-esophageal reflux disease without esophagitis: Secondary | ICD-10-CM

## 2023-09-17 DIAGNOSIS — W19XXXA Unspecified fall, initial encounter: Secondary | ICD-10-CM

## 2023-09-17 DIAGNOSIS — E039 Hypothyroidism, unspecified: Secondary | ICD-10-CM

## 2023-09-17 DIAGNOSIS — E782 Mixed hyperlipidemia: Secondary | ICD-10-CM

## 2023-09-17 DIAGNOSIS — Z794 Long term (current) use of insulin: Secondary | ICD-10-CM

## 2023-09-17 DIAGNOSIS — I5032 Chronic diastolic (congestive) heart failure: Secondary | ICD-10-CM

## 2023-09-17 LAB — GLUCOSE, CAPILLARY
Glucose-Capillary: 233 mg/dL — ABNORMAL HIGH (ref 70–99)
Glucose-Capillary: 182 mg/dL — ABNORMAL HIGH (ref 70–99)
Glucose-Capillary: 202 mg/dL — ABNORMAL HIGH (ref 70–99)
Glucose-Capillary: 171 mg/dL — ABNORMAL HIGH (ref 70–99)

## 2023-09-17 LAB — CBC
HCT: 28.9 % — ABNORMAL LOW (ref 39.0–52.0)
Hemoglobin: 9.8 g/dL — ABNORMAL LOW (ref 13.0–17.0)
MCH: 32.5 pg (ref 26.0–34.0)
MCHC: 33.9 g/dL (ref 30.0–36.0)
MCV: 95.7 fL (ref 80.0–100.0)
Platelets: 48 10*3/uL — ABNORMAL LOW (ref 150–400)
RBC: 3.02 MIL/uL — ABNORMAL LOW (ref 4.22–5.81)
RDW: 17 % — ABNORMAL HIGH (ref 11.5–15.5)
WBC: 2.6 10*3/uL — ABNORMAL LOW (ref 4.0–10.5)
nRBC: 0 % (ref 0.0–0.2)

## 2023-09-17 LAB — AMMONIA: Ammonia: 60 umol/L — ABNORMAL HIGH (ref 9–35)

## 2023-09-17 LAB — BASIC METABOLIC PANEL
Anion gap: 9 (ref 5–15)
BUN: 10 mg/dL (ref 8–23)
CO2: 22 mmol/L (ref 22–32)
Calcium: 8.5 mg/dL — ABNORMAL LOW (ref 8.9–10.3)
Chloride: 106 mmol/L (ref 98–111)
Creatinine, Ser: 0.63 mg/dL (ref 0.61–1.24)
GFR, Estimated: >60 mL/min (ref >60)
Glucose, Bld: 121 mg/dL — ABNORMAL HIGH (ref 70–99)
Potassium: 3.7 mmol/L (ref 3.5–5.1)
Sodium: 137 mmol/L (ref 135–145)

## 2023-09-17 LAB — MAGNESIUM: Magnesium: 2 mg/dL (ref 1.7–2.4)

## 2023-09-17 LAB — PHOSPHORUS: Phosphorus: 3.8 mg/dL (ref 2.5–4.6)

## 2023-09-17 MED ORDER — INSULIN GLARGINE-YFGN 100 UNIT/ML ~~LOC~~ SOLN
10.0000 [IU] | Freq: Every day | SUBCUTANEOUS | Status: DC
  Administered 2023-09-17 – 2023-09-24 (×8): 10 [IU] via SUBCUTANEOUS
  Filled 2023-09-17 (×9): qty 0.1

## 2023-09-17 MED ORDER — PANTOPRAZOLE SODIUM 20 MG PO TBEC
20.0000 mg | DELAYED_RELEASE_TABLET | Freq: Every day | ORAL | Status: DC
  Administered 2023-09-18 – 2023-09-25 (×8): 20 mg via ORAL
  Filled 2023-09-17 (×8): qty 1

## 2023-09-17 MED ORDER — OMEPRAZOLE MAGNESIUM 20 MG PO TBEC: 20.0000 mg | DELAYED_RELEASE_TABLET | Freq: Two times a day (BID) | ORAL | Status: DC

## 2023-09-17 MED ORDER — LIDOCAINE 5 % EX PTCH
1.0000 | MEDICATED_PATCH | CUTANEOUS | Status: DC
  Administered 2023-09-17 – 2023-09-24 (×7): 1 via TRANSDERMAL
  Filled 2023-09-17 (×8): qty 1

## 2023-09-17 MED ORDER — ADULT MULTIVITAMIN W/MINERALS CH
1.0000 | ORAL_TABLET | Freq: Every day | ORAL | Status: DC
  Administered 2023-09-17 – 2023-09-25 (×9): 1 via ORAL
  Filled 2023-09-17 (×9): qty 1

## 2023-09-17 MED ORDER — FUROSEMIDE 20 MG PO TABS
20.0000 mg | ORAL_TABLET | Freq: Every day | ORAL | Status: DC
  Administered 2023-09-18 – 2023-09-25 (×8): 20 mg via ORAL
  Filled 2023-09-17 (×8): qty 1

## 2023-09-17 MED ORDER — ATORVASTATIN CALCIUM 20 MG PO TABS
20.0000 mg | ORAL_TABLET | Freq: Every day | ORAL | Status: DC
  Administered 2023-09-17 – 2023-09-24 (×8): 20 mg via ORAL
  Filled 2023-09-17 (×8): qty 1

## 2023-09-17 NOTE — Progress Notes (Signed)
Mobility Specialist - Progress Note   09/17/23 1053  Mobility  Activity Turned to right side;Turned to left side;Turned to back - supine;Dangled on edge of bed;Stood at bedside;Transferred from bed to chair  Level of Assistance Minimal assist, patient does 75% or more  Assistive Device Front wheel walker  Distance Ambulated (ft) 4 ft  Activity Response Tolerated well  $Mobility charge 1 Mobility  Mobility Specialist Start Time (ACUTE ONLY) 1018  Mobility Specialist Stop Time (ACUTE ONLY) 1043  Mobility Specialist Time Calculation (min) (ACUTE ONLY) 25 min   Pt supine upon entry, utilizing RA. Pt expressed R shoulder and L knee pain this date. Pt agreeable to transfer to the recliner this date. Pt rolled L&R ModA +2 while MS placed brief. Pt completed bed mob Mod/MaxA +2 to scoot towards EOB, ModA +1 to bring trunk from supine to sitting. Pt dangled EOB while MS dons paper scrub top. Pt STS to RW MinA +2 for safety, narrow BOS upon standing. Pt completed transfer to the recliner MinA +2, shuffle steps towards the recliner. Pt left seated in the recliner with alarm set and needs within reach. RN notified.  Zetta Bills Mobility Specialist 09/17/23 11:01 AM

## 2023-09-17 NOTE — Progress Notes (Signed)
Physical Therapy Treatment Patient Details Name: Joshua Berry MRN: [id number] DOB: Jun 21, 1946 Today's Date: 09/17/2023   History of Present Illness Joshua Berry is a 77 y.o. male with medical history significant of NASH, liver cirrhosis, hepatic encephalopathy, esophageal varices, GI bleeding, anemia, thrombocytopenia, HTN, HLD, DM, dCHF, hypothyroidism, OSA not on CPAP, melanoma, s/p TAVR, recent admission due to subdural hematoma, MRSE bacteremia presenting with fall and AMS; admitted for management of hepatic encephalopathy.  Noted recent extended admission from August through September 2024 for issues including subdural hematoma, staphylococcal epidermidis bacteremia.    PT Comments  Pt received in room with OT assisting on BSC. Additional skilled help beneficial for safe transfers and static standing at [facility] for hygiene assist, therefore overlapped with OT session. Pt appears to functionally progressing. Able to demonstrate transfers from [facility] and recliner with MinA and good technique with given vc's. Pt able to tolerate static standing for one minute increments, heavy reliance on RW and CGA for safety. Fair eccentric lowering upon sitting, continues to demonstrate slight tremors and motor control deficits in LE's during foot placement while ambulating. Overall good tolerance for 21ft with RW in room followed closely with chair in case of fatigue/buckling. Will continue to progress per POC while awaiting dispo.    If plan is discharge home, recommend the following: Two people to help with walking and/or transfers;Two people to help with bathing/dressing/bathroom   Can travel by private vehicle     No  Equipment Recommendations       Recommendations for Other Services       Precautions / Restrictions Precautions Precautions: Fall Restrictions Weight Bearing Restrictions: No     Mobility  Bed Mobility               General bed mobility comments: Pt in chair pre/post session     Transfers Overall transfer level: Needs assistance Equipment used: Rolling walker (2 wheels) Transfers: Sit to/from Stand Sit to Stand: Min assist           General transfer comment: vc for forward scoot to edge of chair and commode in prep for sit to stand, and vc for hand placement of arm rests of chair    Ambulation/Gait Ambulation/Gait assistance: Contact guard assist Gait Distance (Feet): 15 Feet Assistive device: Rolling walker (2 wheels) Gait Pattern/deviations: Step-to pattern, Ataxic, Trunk flexed, Narrow base of support Gait velocity: decreased     General Gait Details: Improved gait tolerance and steadiness. Continues to struggle with motor control in LE's however no knee buckling   Stairs             Wheelchair Mobility     Tilt Bed    Modified Rankin (Stroke Patients Only)       Balance Overall balance assessment: Needs assistance Sitting-balance support: No upper extremity supported, Feet supported Sitting balance-Leahy Scale: Fair   Postural control: Posterior lean Standing balance support: Bilateral upper extremity supported Standing balance-Leahy Scale: Poor Standing balance comment: Min A to maintain midline standing while pt attempted cleaning groin in standing with 1 hand on RW for support (posterior lean)                            Cognition Arousal: Alert Behavior During Therapy: WFL for tasks assessed/performed Overall Cognitive Status: History of cognitive impairments - at baseline  General Comments: slower processing, repetetive with hx        Exercises General Exercises - Lower Extremity Ankle Circles/Pumps: AROM, Both, 10 reps, Seated Long Arc Quad: AROM, Both, 10 reps, Seated Hip Flexion/Marching: AROM, Both, 10 reps, Seated    General Comments General comments (skin integrity, edema, etc.): Rash at groin area, powder and cream applied, nursing aware. Pt  continues to progress functionally with each session. Daughter-in-law and wife present for session.      Pertinent Vitals/Pain Pain Assessment Pain Assessment: Faces Faces Pain Scale: Hurts a little bit Pain Location: R shoulder, L knee Pain Descriptors / Indicators: Discomfort Pain Intervention(s): Monitored during session, Limited activity within patient's tolerance    Home Living                          Prior Function            PT Goals (current goals can now be found in the care plan section) Acute Rehab PT Goals Patient Stated Goal: agreeable to participation with session Progress towards PT goals: Progressing toward goals    Frequency    Min 1X/week      PT Plan      Co-evaluation   Reason for Co-Treatment: For patient/therapist safety;To address functional/ADL transfers PT goals addressed during session: Mobility/safety with mobility OT goals addressed during session: ADL's and self-care      AM-PAC PT "6 Clicks" Mobility   Outcome Measure  Help needed turning from your back to your side while in a flat bed without using bedrails?: A Lot Help needed moving from lying on your back to sitting on the side of a flat bed without using bedrails?: A Lot Help needed moving to and from a bed to a chair (including a wheelchair)?: A Lot Help needed standing up from a chair using your arms (e.g., wheelchair or bedside chair)?: A Little Help needed to walk in hospital room?: A Little Help needed climbing 3-5 steps with a railing? : Total 6 Click Score: 13    End of Session Equipment Utilized During Treatment: Gait belt Activity Tolerance: Patient tolerated treatment well Patient left: in chair;with call bell/phone within reach;with chair alarm set;with family/visitor present Nurse Communication: Mobility status (Groin rash) PT Visit Diagnosis: Muscle weakness (generalized) (M62.81);Difficulty in walking, not elsewhere classified (R26.2);Other symptoms  and signs involving the nervous system (R29.898)     Time: 8295-6213 PT Time Calculation (min) (ACUTE ONLY): 24 min  Charges:    $Gait Training: 8-22 mins $Therapeutic Activity: 8-22 mins PT General Charges $$ ACUTE PT VISIT: 1 Visit                    Zadie Cleverly, PTA   Jannet Askew 09/17/2023, 3:33 PM

## 2023-09-17 NOTE — Progress Notes (Signed)
Subjective: Much improved  Exam: Vitals:   09/17/23 0548 09/17/23 0920  BP: 117/60 (!) 109/55  Pulse: 63 70  Resp: 17 18  Temp: 97.9 F (36.6 C) 98.3 F (36.8 C)  SpO2: 98% 100%   Gen: In bed, NAD Resp: non-labored breathing, no acute distress Abd: soft, nt  Neuro: MS: awake, alert, oriented to month and year, gives location as "In rehab... or maybe a hospital" CN:VFF, EOMI Motor: no drift, mild tremor. 5/5 throughout Sensory:intact to LT  Pertinent Labs: Ammonia 60(down form 182 last Wednesday)  Impression: 77 year old male with a history of subdural hematoma, cirrhosis due to NASH with hepatic encephalopathy, who presented with worsening confusion after a fall.  My suspicion is that he currently has a multifactorial encephalopathy including postconcussive effects, continued effects of the subdural hematoma, hyperammonemia, change in environment.  With his continued improvement, no further neurodiagnostic testing is needed, would continue to treat his hepatic encephalopathy.    Recommendations: 1) Neurology will be available on an as needed basis.   Ritta Slot, MD [facility] [phone]  If 7pm- 7am, please page neurology on call as listed in [facility].

## 2023-09-17 NOTE — TOC Progression Note (Signed)
Transition of Care [facility]) - Progression Note    Patient Details  Name: Joshua Berry MRN: [id number] Date of Birth: 1946-11-10  [facility] CM/SW Contact  Allena Katz, LCSW Phone Number: 09/17/2023, 10:34 AM  Clinical Narrative:   CSW spoke with wife who reports she would like pt to go to [facility] for SNF. Wife reports the packet was supposedly sent off by [place] at [facility] [employer]. CSW has left a voicemail to see sent. In the meantime, I have emailed the [facility] social worker and Information systems manager with [facility] to check on the status of this.          Expected Discharge Plan and Services                                               Social Determinants of Health (SDOH) Interventions SDOH Screenings   Food Insecurity: Patient Unable To Answer (09/15/2023)  Housing: Patient Unable To Answer (09/15/2023)  Transportation Needs: Patient Unable To Answer (09/15/2023)  Recent Concern: Transportation Needs - Unmet Transportation Needs (06/17/2023)  Utilities: Patient Unable To Answer (09/15/2023)  Recent Concern: Utilities - At Risk (06/17/2023)  Depression (PHQ2-9): Low Risk  (04/24/2023)  Financial Resource Strain: Low Risk  (08/06/2023)   Received from [facility]  Social Connections: Unknown (04/29/2022)   Received from [facility]  Tobacco Use: Medium Risk (09/15/2023)    Readmission Risk Interventions    09/15/2023    8:54 AM 08/14/2023    2:18 PM 07/15/2023    8:52 AM  Readmission Risk Prevention Plan  Transportation Screening Complete Complete Complete  PCP or Specialist Appt within 3-5 Days Complete Complete Complete  HRI or Home Care Consult Complete Complete   Social Work Consult for Recovery Care Planning/Counseling Not Complete Complete Complete  Palliative Care Screening Not Applicable Not Applicable Not Applicable  Medication Review Oceanographer) Not Complete Complete Referral to Pharmacy  Med Review Comments  Patient confused and disoriented.

## 2023-09-17 NOTE — Plan of Care (Signed)
  Problem: Education: Goal: Knowledge of General Education information will improve Description: Including pain rating scale, medication(s)/side effects and non-pharmacologic comfort measures Outcome: Progressing   Problem: Health Behavior/Discharge Planning: Goal: Ability to manage health-related needs will improve Outcome: Progressing   Problem: Clinical Measurements: Goal: Ability to maintain clinical measurements within normal limits will improve Outcome: Progressing Goal: Will remain free from infection Outcome: Progressing Goal: Diagnostic test results will improve Outcome: Progressing Goal: Respiratory complications will improve Outcome: Progressing Goal: Cardiovascular complication will be avoided Outcome: Progressing   Problem: Nutrition: Goal: Adequate nutrition will be maintained Outcome: Progressing   Problem: Activity: Goal: Risk for activity intolerance will decrease Outcome: Progressing   Problem: Coping: Goal: Level of anxiety will decrease Outcome: Progressing   Problem: Elimination: Goal: Will not experience complications related to bowel motility Outcome: Progressing Goal: Will not experience complications related to urinary retention Outcome: Progressing   Problem: Safety: Goal: Ability to remain free from injury will improve Outcome: Progressing   Problem: Education: Goal: Ability to describe self-care measures that may prevent or decrease complications (Diabetes Survival Skills Education) will improve Outcome: Progressing Goal: Individualized Educational Video(s) Outcome: Progressing

## 2023-09-17 NOTE — Plan of Care (Signed)

## 2023-09-17 NOTE — Progress Notes (Signed)
Occupational Therapy Treatment Patient Details Name: KELSEY CALVI MRN: [id number] DOB: Jun 12, 1946 Today's Date: 09/17/2023   History of present illness SEBASTIN DABDOUB is a 77 y.o. male with medical history significant of NASH, liver cirrhosis, hepatic encephalopathy, esophageal varices, GI bleeding, anemia, thrombocytopenia, HTN, HLD, DM, dCHF, hypothyroidism, OSA not on CPAP, melanoma, s/p TAVR, recent admission due to subdural hematoma, MRSE bacteremia presenting with fall and AMS; admitted for management of hepatic encephalopathy.  Noted recent extended admission from August through September 2024 for issues including subdural hematoma, staphylococcal epidermidis bacteremia.   OT comments  Pt sitting up in chair upon OT arrival, wife arrived after OT entry.  Pt verbalized need to void and agreeable to [place] transfer.  Pt had already had episode of bowel and bladder incontinence in diaper, but felt he needed to void additionally.  Pt able to perform scoot to edge of recliner with vc only, and sit to stand and step pivot to [facility] with min A.  Pt able to have additional large BM in commode.  PTA entered shortly after BM and participated in cotx to allow pt to perform standing peri care.  Multiple sit to stands completed for thorough cleaning d/t pt needing rest break following an attempt at cleaning his own groin.  Pt made excellent attempt but required total assist for thorough cleaning and application of powder and barrier cream in standing.  New diaper applied in standing.  OT left pt in care of PTA end of session to participate in additional mobility.        If plan is discharge home, recommend the following:  A lot of help with bathing/dressing/bathroom;Help with stairs or ramp for entrance;A lot of help with walking and/or transfers   Equipment Recommendations  Other (comment) (defer to next venue of care)    Recommendations for Other Services      Precautions / Restrictions  Precautions Precautions: Fall Restrictions Weight Bearing Restrictions: No       Mobility Bed Mobility               General bed mobility comments: NT this date as pt was received in chair and left with PT end of OT session. Patient Response: Cooperative  Transfers Overall transfer level: Needs assistance Equipment used: Rolling walker (2 wheels) Transfers: Sit to/from Stand Sit to Stand: Min assist           General transfer comment: vc for forward scoot to edge of chair and commode in prep for sit to stand, and vc for hand placement of arm rests of chair     Balance Overall balance assessment: Needs assistance Sitting-balance support: No upper extremity supported, Feet supported Sitting balance-Leahy Scale: Fair   Postural control: Posterior lean Standing balance support: Bilateral upper extremity supported Standing balance-Leahy Scale: Poor Standing balance comment: Min A to maintain midline standing while pt attempted cleaning groin in standing with 1 hand on RW for support (posterior lean)                           ADL either performed or assessed with clinical judgement   ADL Overall ADL's : Needs assistance/impaired                         Toilet Transfer: Minimal assistance;BSC/3in1;Rolling walker (2 wheels) Toilet Transfer Details (indicate cue type and reason): increased time, 1 person assist Toileting- Clothing Manipulation and Hygiene: Total assistance;Sit to/from  stand Toileting - Clothing Manipulation Details (indicate cue type and reason): Pt made good effort to clean groin briefly, but ultimately required 2 hands on RW d/t fatigue and instability in standing.     Functional mobility during ADLs: Minimal assistance;Rolling walker (2 wheels);Cueing for safety;Cueing for sequencing      Extremity/Trunk Assessment Upper Extremity Assessment Upper Extremity Assessment: Generalized weakness   Lower Extremity Assessment Lower  Extremity Assessment: Generalized weakness        Vision Patient Visual Report: No change from baseline                Cognition Arousal: Alert Behavior During Therapy: WFL for tasks assessed/performed Overall Cognitive Status: History of cognitive impairments - at baseline                                 General Comments: slower processing                     General Comments rash at groin and gluteal crease; applied powder and barrier cream post toileting/hygiene    Pertinent Vitals/ Pain       Pain Assessment Faces Pain Scale: Hurts little more Pain Location: R shoulder, L knee Pain Descriptors / Indicators: Discomfort Pain Intervention(s): Limited activity within patient's tolerance, Monitored during session, Repositioned  Home Living                                          Prior Functioning/Environment              Frequency  Min 1X/week        Progress Toward Goals  OT Goals(current goals can now be found in the care plan section)  Progress towards OT goals: Progressing toward goals  Acute Rehab OT Goals Patient Stated Goal: to return to PLOF OT Goal Formulation: With patient/family Time For Goal Achievement: 09/30/23 Potential to Achieve Goals: Good  Plan      Co-evaluation      Reason for Co-Treatment: For patient/therapist safety;To address functional/ADL transfers PT goals addressed during session: Mobility/safety with mobility OT goals addressed during session: ADL's and self-care      AM-PAC OT "6 Clicks" Daily Activity     Outcome Measure   Help from another person eating meals?: None Help from another person taking care of personal grooming?: A Little Help from another person toileting, which includes using toliet, bedpan, or urinal?: A Lot Help from another person bathing (including washing, rinsing, drying)?: A Lot Help from another person to put on and taking off regular upper body  clothing?: A Little Help from another person to put on and taking off regular lower body clothing?: A Lot 6 Click Score: 16    End of Session Equipment Utilized During Treatment: Gait belt;Rolling walker (2 wheels)  OT Visit Diagnosis: Other abnormalities of gait and mobility (R26.89);Muscle weakness (generalized) (M62.81)   Activity Tolerance Patient tolerated treatment well   Patient Left Other (comment) (in chair with PTA and spouse present)   Nurse Communication Other (comment) (Pt able to have large BM in commode.  New diaper donned after toilet hygiene.)        Time: 1349-1419 OT Time Calculation (min): 30 min  Charges: OT General Charges $OT Visit: 1 Visit OT Treatments $Self Care/Home Management : 8-22 mins  Danelle Earthly, MS, OTR/L   Otis Dials 09/17/2023, 3:01 PM

## 2023-09-17 NOTE — Progress Notes (Signed)
Progress Note   [name] NWG:[id number] DOB: 1946-05-10 DOA: 09/15/2023     2 DOS: the patient was seen and examined on 09/17/2023   Brief hospital course: Joshua Berry is a 77 y.o. male with medical history significant of NASH, liver cirrhosis, hepatic encephalopathy, esophageal varices, GI bleeding, anemia, thrombocytopenia, HTN, HLD, DM, dCHF, hypothyroidism, OSA not on CPAP, melanoma, s/p of TAVR, recent admission due to subdural hematoma, MRSE bacteremia presenting with fall and hepatic encephalopathy.  Limited history in the setting of encephalopathy.  Per report, patient with fall at local rehab facility.  Noted recent extended admission from August through September 2024 for issues including subdural hematoma, staphylococcal epidermidis bacteremia.  Had protracted course in the [facility] system for for issues including middle meningeal artery embolization.  Per report, patient with noted fall at the rehab.  Daughter reports worsening confusion over the past 2 days.     ED w/up afebrile, hemodynamically stable. White count 3.6, hemoglobin 10.6, platelets 58, urinalysis within normal limits, creatinine 0.76, glucose 162, T. bili 2. INR 1.4. Ammonia level 102. EKG normal sinus rhythm. CT head with ongoing bilateral mixed density subdural hematomas stable mild intracranial mass effect with right subdural being stable and left subdural being decreased. Left anterior vertex scalp hematoma.    Assessment and Plan: Hepatic encephalopathy  Decompensated hepatic encephalopathy with noted fall  Ammonia level 104 --85 trending down Continue lactulose and rifaximin  Titrate lactulose to 2-3 bowel movements, Cont rifaximin  Follow neurochecks closely    Subdural hematoma (HCC) + recent fall w/ noted decompensated hepatic encephalopathy  Noted extended hospitalization 7/29-->9/4 for SDH s/p Status post bilateral frontal burr hole and embolization procedure in the [facility] system  CT head with  stable, decreasing SDH  Noted Left anterior vertex scalp hematoma  Fall precautions  Holding AC due to intracranial bleed. Neurology signed off, no intervention for now.   Fall Positive fall with noted secondary head trauma in setting of decompensated hepatic encephalopathy Stable subdural hematomas on repeat imaging Check orthostatic vital signs when able to Fall precautions PT OT  -SNF placement   Hypomagnesemia, mag repleted. Monitor electrolytes daily.   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   Chronic diastolic CHF (congestive heart failure) (HCC) 2D ECHO 07/2023 w/ EF 60-65% and grade 1 DD  Encourage oral diet, fluids. Monitor volume status    HLD (hyperlipidemia) Statin resumed    GERD without esophagitis Continue PPI   Hypothyroidism, TSH 2.8 wnl Cont synthroid    Type 2 diabetes mellitus without complications (HCC) A1c 5.4 on 06/17/23. Blood sugars elevated. Lantus 10 u at bedtime ordered. Continue Accu-Cheks, sliding scale insulin.       Out of bed to chair. Incentive spirometry. Nursing supportive care. Fall, aspiration precautions. DVT prophylaxis   Code Status: Do not attempt resuscitation (DNR) PRE-ARREST INTERVENTIONS DESIRED  Subjective: Patient is seen and examined today morning. He is lying comfortably. Has on and off confusion, but able to answer me appropriately. Daughter at bedside asked about his placement.  Physical Exam: Vitals:   09/16/23 2144 09/17/23 0548 09/17/23 0920 09/17/23 1716  BP: (!) 149/74 117/60 (!) 109/55 (!) 112/55  Pulse: 70 63 70 70  Resp: 18 17 18 19   Temp: 98.9 F (37.2 C) 97.9 F (36.6 C) 98.3 F (36.8 C) 98.2 F (36.8 C)  TempSrc: Oral     SpO2: 100% 98%  100% 100%  Weight:      Height:        General - Elderly Caucasian male, no apparent distress HEENT - PERRLA, EOMI, scalp surgical scar, bruises noted, non tender  sinuses. Lung - Clear, diffuse rales, rhonchi, no wheezes. Heart - S1, S2 heard, no murmurs, rubs, trace pedal edema. Abdomen - Soft, non tender, bowel sounds good Neuro - Alert, awake and oriented, able to answer me, non focal exam. Skin - Warm and dry.  Data Reviewed:      Latest Ref Rng & Units 09/17/2023    5:39 AM 09/16/2023    5:39 AM 09/15/2023    3:54 AM  CBC  WBC 4.0 - 10.5 K/uL 2.6  2.8  3.6   Hemoglobin 13.0 - 17.0 g/dL 9.8  9.9  78.2   Hematocrit 39.0 - 52.0 % 28.9  27.6  32.0   Platelets 150 - 400 K/uL 48  52  58       Latest Ref Rng & Units 09/17/2023    5:39 AM 09/16/2023    5:39 AM 09/15/2023    3:54 AM  BMP  Glucose 70 - 99 mg/dL 956  213  086   BUN 8 - 23 mg/dL 10  9  12    Creatinine 0.61 - 1.24 mg/dL 5.78  4.69  6.29   Sodium 135 - 145 mmol/L 137  136  138   Potassium 3.5 - 5.1 mmol/L 3.7  3.5  3.5   Chloride 98 - 111 mmol/L 106  109  107   CO2 22 - 32 mmol/L 22  20  21    Calcium 8.9 - 10.3 mg/dL 8.5  8.7  9.0    CT HEAD WO CONTRAST ( )  Addendum Date: 09/16/2023   ADDENDUM REPORT: 09/16/2023 10:39 ADDENDUM: Impression #2 should state: Increased size of left frontal scalp hematoma. Electronically Signed   By: Lorenza Cambridge M.D.   On: 09/16/2023 10:39   Result Date: 09/16/2023 CLINICAL DATA:  Subdural hematoma follow-up EXAM: CT HEAD WITHOUT CONTRAST TECHNIQUE: Contiguous axial images were obtained from the base of the skull through the vertex without intravenous contrast. RADIATION DOSE REDUCTION: This exam was performed according to the departmental dose-optimization program which includes automated exposure control, adjustment of the mA and/or kV according to patient size and/or use of iterative reconstruction technique. COMPARISON:  09/15/2023 at 4 a.m. FINDINGS: Brain: Unchanged appearance of right convexity mixed density subdural hematoma, predominantly hypodense. 4 mm leftward midline shift is unchanged. Smaller left convexity chronic subdural hematoma is  also unchanged. Vascular: Negative Skull: Increased left frontal scalp hematoma. No skull fracture. Bilateral anterior burr holes. Sinuses/Orbits: No acute finding. Other: None. IMPRESSION: 1. Unchanged appearance of bilateral convexity subdural hematomas, right greater than left, with 4 mm of leftward midline shift. 2. Increased left frontal scalp subdural hematoma. Electronically Signed: By: Deatra Robinson M.D. On: 09/16/2023 03:13   DG Knee 1-2 Views Left  Result Date: 09/15/2023 CLINICAL DATA:  Left knee pain following fall, initial encounter EXAM: LEFT KNEE - 2 VIEW COMPARISON:  None Available. FINDINGS: Tricompartmental degenerative changes are noted. Small joint effusion is seen. No acute fracture or dislocation is noted. IMPRESSION: Degenerative change without acute abnormality. Electronically Signed   By: Alcide Clever M.D.   On: 09/15/2023 21:26     Family Communication: Discussed with patient, daughter at bedside they understand and agree. All questions answereed.    Disposition: Status is: Inpatient Remains inpatient appropriate because: SNF placement, insurance auth  Planned  Discharge Destination: Skilled nursing facility     Time spent: 37 minutes  Author: Marcelino Duster, MD 09/17/2023 5:19 PM Secure chat 7am to 7pm For on call review [email or web address].

## 2023-09-17 NOTE — TOC Progression Note (Signed)
Transition of Care [facility]) - Progression Note    Patient Details  Name: Joshua Berry MRN: [id number] Date of Birth: 09/04/1946  Transition of Care [facility]) CM/SW Contact  Allena Katz, LCSW Phone Number: 09/17/2023, 12:37 PM  Clinical Narrative:   Gavin Pound reports that [place] [facility] will not approve WOM.         Expected Discharge Plan and Services                                               Social Determinants of Health (SDOH) Interventions SDOH Screenings   Food Insecurity: Patient Unable To Answer (09/15/2023)  Housing: Patient Unable To Answer (09/15/2023)  Transportation Needs: Patient Unable To Answer (09/15/2023)  Recent Concern: Transportation Needs - Unmet Transportation Needs (06/17/2023)  Utilities: Patient Unable To Answer (09/15/2023)  Recent Concern: Utilities - At Risk (06/17/2023)  Depression (PHQ2-9): Low Risk  (04/24/2023)  Financial Resource Strain: Low Risk  (08/06/2023)   Received from [facility]  Social Connections: Unknown (04/29/2022)   Received from [facility]  Tobacco Use: Medium Risk (09/15/2023)    Readmission Risk Interventions    09/15/2023    8:54 AM 08/14/2023    2:18 PM 07/15/2023    8:52 AM  Readmission Risk Prevention Plan  Transportation Screening Complete Complete Complete  PCP or Specialist Appt within 3-5 Days Complete Complete Complete  HRI or Home Care Consult Complete Complete   Social Work Consult for Recovery Care Planning/Counseling Not Complete Complete Complete  Palliative Care Screening Not Applicable Not Applicable Not Applicable  Medication Review Oceanographer) Not Complete Complete Referral to Pharmacy  Med Review Comments Patient confused and disoriented.

## 2023-09-18 DIAGNOSIS — D61818 Other pancytopenia: Secondary | ICD-10-CM | POA: Diagnosis not present

## 2023-09-18 DIAGNOSIS — W19XXXA Unspecified fall, initial encounter: Secondary | ICD-10-CM | POA: Diagnosis not present

## 2023-09-18 DIAGNOSIS — S065XAA Traumatic subdural hemorrhage with loss of consciousness status unknown, initial encounter: Secondary | ICD-10-CM | POA: Diagnosis not present

## 2023-09-18 DIAGNOSIS — K7682 Hepatic encephalopathy: Secondary | ICD-10-CM | POA: Diagnosis not present

## 2023-09-18 LAB — GLUCOSE, CAPILLARY
Glucose-Capillary: 162 mg/dL — ABNORMAL HIGH (ref 70–99)
Glucose-Capillary: 164 mg/dL — ABNORMAL HIGH (ref 70–99)
Glucose-Capillary: 167 mg/dL — ABNORMAL HIGH (ref 70–99)
Glucose-Capillary: 205 mg/dL — ABNORMAL HIGH (ref 70–99)
Glucose-Capillary: 245 mg/dL — ABNORMAL HIGH (ref 70–99)

## 2023-09-18 NOTE — Progress Notes (Signed)
Progress Note   [name]:[id number] DOB: 1946-10-25 DOA: 09/15/2023     3 DOS: the patient was seen and examined on 09/18/2023   Brief hospital course: DARBY KAUP is a 77 y.o. male with medical history significant of NASH, liver cirrhosis, hepatic encephalopathy, esophageal varices, GI bleeding, anemia, thrombocytopenia, HTN, HLD, DM, dCHF, hypothyroidism, OSA not on CPAP, melanoma, s/p of TAVR, recent admission due to subdural hematoma, MRSE bacteremia presenting with fall and hepatic encephalopathy.  Limited history in the setting of encephalopathy.  Per report, patient with fall at local rehab facility.  Noted recent extended admission from August through September 2024 for issues including subdural hematoma, staphylococcal epidermidis bacteremia.  Had protracted course in the [facility] system for for issues including middle meningeal artery embolization.  Per report, patient with noted fall at the rehab.  Daughter reports worsening confusion over the past 2 days.     ED w/up afebrile, hemodynamically stable. White count 3.6, hemoglobin 10.6, platelets 58, urinalysis within normal limits, creatinine 0.76, glucose 162, T. bili 2. INR 1.4. Ammonia level 102. EKG normal sinus rhythm. CT head with ongoing bilateral mixed density subdural hematomas stable mild intracranial mass effect with right subdural being stable and left subdural being decreased. Left anterior vertex scalp hematoma.    Assessment and Plan: Hepatic encephalopathy  Decompensated hepatic encephalopathy with noted fall  Ammonia level 104 --85 trending down Continue lactulose and rifaximin  Titrate lactulose to 2-3 bowel movements, Cont rifaximin  Follow neurochecks closely    Subdural hematoma (HCC) + recent fall w/ noted decompensated hepatic encephalopathy  Prior extended hospitalization 7/29-->9/4 for SDH s/p Status post bilateral frontal burr hole and embolization procedure in the [facility] system.  CT head with  stable, decreasing SDH  Noted Left anterior vertex scalp hematoma. Fall precautions. PT follow up. Holding AC due to intracranial bleed. Neurology signed off, no intervention for now.   Fall Positive fall with noted secondary head trauma in setting of decompensated hepatic encephalopathy Stable subdural hematomas on repeat imaging Check orthostatic vital signs when able to Fall precautions PT OT  -SNF placement, awaiting auth.   Hypomagnesemia, mag repleted. Monitor electrolytes daily.   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   Chronic diastolic CHF (congestive heart failure) (HCC) 2D ECHO 07/2023 w/ EF 60-65% and grade 1 DD  Encourage oral diet, fluids. Monitor volume status    HLD (hyperlipidemia) Statin resumed    GERD without esophagitis Continue PPI   Hypothyroidism, TSH 2.8 wnl Cont synthroid    Type 2 diabetes mellitus without complications (HCC) A1c 5.4 on 06/17/23. Blood sugars elevated. Lantus 10 u at bedtime ordered. Continue Accu-Cheks, sliding scale insulin.       Out of bed to chair. Incentive spirometry. Nursing supportive care. Fall, aspiration precautions. DVT prophylaxis   Code Status: Do not attempt resuscitation (DNR) PRE-ARREST INTERVENTIONS DESIRED  Subjective: Patient is seen and examined today morning. He is sitting in comfortably. Worked with PT, states he feels better. Daughter at bedside asked about incentive spirometry, skin cream.   Physical Exam: Vitals:   09/17/23 2123 09/18/23 0427 09/18/23 0444 09/18/23 0746  BP: 112/63 (!) 87/48 (!) 108/44 (!) 103/56  Pulse: 73 69 72 70  Resp: 18 16  18   Temp: 99.2 F (37.3 C) 98.2 F (36.8 C)  98.7 F (37.1 C)  TempSrc: Oral Oral    SpO2: 99% 99%  96%  Weight:      Height:        General - Elderly Caucasian male, sitting in no apparent distress HEENT - PERRLA, EOMI, scalp surgical scar, bruises noted,  non tender sinuses. Lung - Clear, diffuse rales, rhonchi, no wheezes. Heart - S1, S2 heard, no murmurs, rubs, trace pedal edema. Abdomen - Soft, non tender, bowel sounds good Neuro - Alert, awake and oriented, able to answer me, non focal exam. Skin - Warm and dry.  Data Reviewed:      Latest Ref Rng & Units 09/17/2023    5:39 AM 09/16/2023    5:39 AM 09/15/2023    3:54 AM  CBC  WBC 4.0 - 10.5 K/uL 2.6  2.8  3.6   Hemoglobin 13.0 - 17.0 g/dL 9.8  9.9  75.6   Hematocrit 39.0 - 52.0 % 28.9  27.6  32.0   Platelets 150 - 400 K/uL 48  52  58       Latest Ref Rng & Units 09/17/2023    5:39 AM 09/16/2023    5:39 AM 09/15/2023    3:54 AM  BMP  Glucose 70 - 99 mg/dL 433  295  188   BUN 8 - 23 mg/dL 10  9  12    Creatinine 0.61 - 1.24 mg/dL 4.16  6.06  3.01   Sodium 135 - 145 mmol/L 137  136  138   Potassium 3.5 - 5.1 mmol/L 3.7  3.5  3.5   Chloride 98 - 111 mmol/L 106  109  107   CO2 22 - 32 mmol/L 22  20  21    Calcium 8.9 - 10.3 mg/dL 8.5  8.7  9.0    No results found.   Family Communication: Discussed with patient, daughter at bedside they understand and agree. All questions answereed.    Disposition: Status is: Inpatient Remains inpatient appropriate because: SNF placement, insurance auth  Planned Discharge Destination: Skilled nursing facility     Time spent: 36 minutes  Author: Marcelino Duster, MD 09/18/2023 2:50 PM Secure chat 7am to 7pm For on call review [email or web address].

## 2023-09-18 NOTE — TOC Progression Note (Signed)
Transition of Care [facility]) - Progression Note    Patient Details  Name: Joshua Berry MRN: [id number] Date of Birth: Jan 19, 1946  Transition of Care [facility]) CM/SW Contact  Allena Katz, LCSW Phone Number: 09/18/2023, 11:38 AM  Clinical Narrative:     CSW has updated wife on [place] not covering WOM. She would like to see if there is some kind of option for an appeal. CSW LVM with deborah. No one from [place] has emailed back.       Expected Discharge Plan and Services                                               Social Determinants of Health (SDOH) Interventions SDOH Screenings   Food Insecurity: Patient Unable To Answer (09/15/2023)  Housing: Patient Unable To Answer (09/15/2023)  Transportation Needs: Patient Unable To Answer (09/15/2023)  Recent Concern: Transportation Needs - Unmet Transportation Needs (06/17/2023)  Utilities: Patient Unable To Answer (09/15/2023)  Recent Concern: Utilities - At Risk (06/17/2023)  Depression (PHQ2-9): Low Risk  (04/24/2023)  Financial Resource Strain: Low Risk  (08/06/2023)   Received from [facility]  Social Connections: Unknown (04/29/2022)   Received from [facility]  Tobacco Use: Medium Risk (09/15/2023)    Readmission Risk Interventions    09/15/2023    8:54 AM 08/14/2023    2:18 PM 07/15/2023    8:52 AM  Readmission Risk Prevention Plan  Transportation Screening Complete Complete Complete  PCP or Specialist Appt within 3-5 Days Complete Complete Complete  HRI or Home Care Consult Complete Complete   Social Work Consult for Recovery Care Planning/Counseling Not Complete Complete Complete  Palliative Care Screening Not Applicable Not Applicable Not Applicable  Medication Review Oceanographer) Not Complete Complete Referral to Pharmacy  Med Review Comments Patient confused and disoriented.

## 2023-09-18 NOTE — Progress Notes (Signed)
Physical Therapy Treatment Patient Details Name: Joshua Berry MRN: [id number] DOB: 1946-10-26 Today's Date: 09/18/2023   History of Present Illness Joshua Berry is a 77 y.o. male with medical history significant of NASH, liver cirrhosis, hepatic encephalopathy, esophageal varices, GI bleeding, anemia, thrombocytopenia, HTN, HLD, DM, dCHF, hypothyroidism, OSA not on CPAP, melanoma, s/p TAVR, recent admission due to subdural hematoma, MRSE bacteremia presenting with fall and AMS; admitted for management of hepatic encephalopathy.  Noted recent extended admission from August through September 2024 for issues including subdural hematoma, staphylococcal epidermidis bacteremia.    PT Comments  Pt received in bed, daughter at bedside, agreeable to PT. Pt required ModA to transfer to EOB with HOB raised and assist to scoot. Several sit<>stand transfers for hygiene and donning shorts. Pt progressed gait distance to 108ft prior to fatiguing. B LE motor control and foot placement appears to be better controled. NBOS in standing due to bilateral knee Varus. Pt also expresses 7/10 L knee pain affecting standing tolerance. Overall, pt remains very motivated with supportive family. Anticipate continued functional progression. Family and pt educated on no longer using diaper/brief for occasional urine incontinence due to several days of unresolved rash in groin area as well as sacral region, nursing notified.    If plan is discharge home, recommend the following: Two people to help with walking and/or transfers;Two people to help with bathing/dressing/bathroom   Can travel by private vehicle     No  Equipment Recommendations  None recommended by PT    Recommendations for Other Services       Precautions / Restrictions Precautions Precautions: Fall Restrictions Weight Bearing Restrictions: No     Mobility  Bed Mobility Overal bed mobility: Needs Assistance Bed Mobility: Rolling, Supine to  Sit Rolling: Mod assist, Used rails   Supine to sit: Mod assist, HOB elevated, Used rails     General bed mobility comments:  (Increased time to complete)    Transfers Overall transfer level: Needs assistance Equipment used: Rolling walker (2 wheels) Transfers: Sit to/from Stand Sit to Stand: Min assist, Contact guard assist           General transfer comment: vc's for hand placement and weight shifting forward    Ambulation/Gait Ambulation/Gait assistance: Contact guard assist Gait Distance (Feet): 30 Feet Assistive device: Rolling walker (2 wheels) Gait Pattern/deviations: Step-to pattern, Ataxic, Trunk flexed, Narrow base of support Gait velocity: decreased     General Gait Details: Improved gait tolerance and steadiness. Continues to struggle with motor control in LE's however no knee buckling. 30'x1, 15'x1   Stairs             Wheelchair Mobility     Tilt Bed    Modified Rankin (Stroke Patients Only)       Balance Overall balance assessment: Needs assistance Sitting-balance support: No upper extremity supported, Feet supported Sitting balance-Leahy Scale: Fair     Standing balance support: Bilateral upper extremity supported, During functional activity, Reliant on assistive device for balance Standing balance-Leahy Scale: Fair Standing balance comment: Able to ambulate with RW and CGA                            Cognition Arousal: Alert Behavior During Therapy: WFL for tasks assessed/performed Overall Cognitive Status: History of cognitive impairments - at baseline  General Comments: slower processing, repetetive with hx        Exercises General Exercises - Lower Extremity Ankle Circles/Pumps: AROM, Both, 10 reps, Seated Long Arc Quad: AROM, Both, 10 reps, Seated Hip ABduction/ADduction: AROM, Both, 10 reps, Seated Hip Flexion/Marching: AROM, Both, 10 reps, Seated    General  Comments General comments (skin integrity, edema, etc.):  (Severe red rash in peri area, red areas on buttocks, L knee dressing intact covering open wound. Nursing notified. Educated pt and daughter regarding role of PT, current goals, and pt's progress since admit)      Pertinent Vitals/Pain Pain Assessment Pain Assessment: 0-10 Pain Score: 7  Pain Location: L knee Pain Descriptors / Indicators: Discomfort, Grimacing Pain Intervention(s): Patient requesting pain meds-RN notified    Home Living                          Prior Function            PT Goals (current goals can now be found in the care plan section) Acute Rehab PT Goals Patient Stated Goal: agreeable to participation with session Progress towards PT goals: Progressing toward goals    Frequency    Min 1X/week      PT Plan      Co-evaluation              AM-PAC PT "6 Clicks" Mobility   Outcome Measure  Help needed turning from your back to your side while in a flat bed without using bedrails?: A Lot Help needed moving from lying on your back to sitting on the side of a flat bed without using bedrails?: A Lot Help needed moving to and from a bed to a chair (including a wheelchair)?: A Little Help needed standing up from a chair using your arms (e.g., wheelchair or bedside chair)?: A Little Help needed to walk in hospital room?: A Little Help needed climbing 3-5 steps with a railing? : Total 6 Click Score: 14    End of Session Equipment Utilized During Treatment: Gait belt Activity Tolerance: Patient tolerated treatment well Patient left: in chair;with call bell/phone within reach;with chair alarm set;with family/visitor present Nurse Communication: Mobility status (Goin rash, buttocks sore, L knee wound requiring care) PT Visit Diagnosis: Muscle weakness (generalized) (M62.81);Difficulty in walking, not elsewhere classified (R26.2);Other symptoms and signs involving the nervous system  (R29.898)     Time: 1101-1140 PT Time Calculation (min) (ACUTE ONLY): 39 min  Charges:    $Gait Training: 8-22 mins $Therapeutic Exercise: 8-22 mins $Therapeutic Activity: 8-22 mins PT General Charges $$ ACUTE PT VISIT: 1 Visit                    Zadie Cleverly, PTA  Jannet Askew 09/18/2023, 1:54 PM

## 2023-09-18 NOTE — Progress Notes (Signed)
Patient's peri area and groin noted to be red and raw. Pt is currently wearing diapers provided by family at their request. Nursing staff will continue to provide peri care and proper hygiene to patient. Education provided to patient and family regarding risks of skin breakdown. Verbalizes understanding.

## 2023-09-19 DIAGNOSIS — W19XXXA Unspecified fall, initial encounter: Secondary | ICD-10-CM | POA: Diagnosis not present

## 2023-09-19 DIAGNOSIS — S065XAA Traumatic subdural hemorrhage with loss of consciousness status unknown, initial encounter: Secondary | ICD-10-CM | POA: Diagnosis not present

## 2023-09-19 DIAGNOSIS — K7682 Hepatic encephalopathy: Secondary | ICD-10-CM | POA: Diagnosis not present

## 2023-09-19 DIAGNOSIS — D61818 Other pancytopenia: Secondary | ICD-10-CM | POA: Diagnosis not present

## 2023-09-19 LAB — GLUCOSE, CAPILLARY
Glucose-Capillary: 141 mg/dL — ABNORMAL HIGH (ref 70–99)
Glucose-Capillary: 270 mg/dL — ABNORMAL HIGH (ref 70–99)
Glucose-Capillary: 177 mg/dL — ABNORMAL HIGH (ref 70–99)

## 2023-09-19 NOTE — TOC Progression Note (Signed)
Transition of Care [facility]) - Progression Note    Patient Details  Name: Joshua Berry MRN: [id number] Date of Birth: 06/16/1946  Transition of Care [facility]) CM/SW Contact  Allena Katz, LCSW Phone Number: 09/19/2023, 3:18 PM  Clinical Narrative:   Message left with kim stevens at [facility] [phone].          Expected Discharge Plan and Services                                               Social Determinants of Health (SDOH) Interventions SDOH Screenings   Food Insecurity: Patient Unable To Answer (09/15/2023)  Housing: Patient Unable To Answer (09/15/2023)  Transportation Needs: Patient Unable To Answer (09/15/2023)  Recent Concern: Transportation Needs - Unmet Transportation Needs (06/17/2023)  Utilities: Patient Unable To Answer (09/15/2023)  Recent Concern: Utilities - At Risk (06/17/2023)  Depression (PHQ2-9): Low Risk  (04/24/2023)  Financial Resource Strain: Low Risk  (08/06/2023)   Received from [facility]  Social Connections: Unknown (04/29/2022)   Received from [facility], [facility]  Tobacco Use: Medium Risk (09/15/2023)    Readmission Risk Interventions    09/15/2023    8:54 AM 08/14/2023    2:18 PM 07/15/2023    8:52 AM  Readmission Risk Prevention Plan  Transportation Screening Complete Complete Complete  PCP or Specialist Appt within 3-5 Days Complete Complete Complete  HRI or Home Care Consult Complete Complete   Social Work Consult for Recovery Care Planning/Counseling Not Complete Complete Complete  Palliative Care Screening Not Applicable Not Applicable Not Applicable  Medication Review Oceanographer) Not Complete Complete Referral to Pharmacy  Med Review Comments Patient confused and disoriented.

## 2023-09-19 NOTE — Progress Notes (Signed)
Occupational Therapy Treatment Patient Details Name: Joshua Berry MRN: [id number] DOB: 1946-07-01 Today's Date: 09/19/2023   History of present illness Joshua Berry is a 77 y.o. male with medical history significant of NASH, liver cirrhosis, hepatic encephalopathy, esophageal varices, GI bleeding, anemia, thrombocytopenia, HTN, HLD, DM, dCHF, hypothyroidism, OSA not on CPAP, melanoma, s/p TAVR, recent admission due to subdural hematoma, MRSE bacteremia presenting with fall and AMS; admitted for management of hepatic encephalopathy.  Noted recent extended admission from August through September 2024 for issues including subdural hematoma, staphylococcal epidermidis bacteremia.   OT comments  Pt received seated in recliner; family member leaving the room as OT arriving. Appearing alert; willing to work with OT on toileting. T/f MIN A from recliner, then later CGA from [facility]. See flowsheet below for further details of session. Left seated in recliner with all needs in reach.  Patient will benefit from continued OT while in acute care.       If plan is discharge home, recommend the following:  A lot of help with bathing/dressing/bathroom;Help with stairs or ramp for entrance;A lot of help with walking and/or transfers   Equipment Recommendations  Other (comment) (defer to next venue)    Recommendations for Other Services      Precautions / Restrictions Precautions Precautions: Fall Restrictions Weight Bearing Restrictions: No       Mobility Bed Mobility               General bed mobility comments: Not tested; pt received in recliner and left in recliner at end of session    Transfers Overall transfer level: Needs assistance Equipment used: Rolling walker (2 wheels) Transfers: Sit to/from Stand Sit to Stand: Min assist, Contact guard assist           General transfer comment: cues for hand placement     Balance Overall balance assessment: Needs  assistance Sitting-balance support: No upper extremity supported, Feet supported Sitting balance-Leahy Scale: Good     Standing balance support: Bilateral upper extremity supported, During functional activity, Reliant on assistive device for balance Standing balance-Leahy Scale: Fair                             ADL either performed or assessed with clinical judgement   ADL Overall ADL's : Needs assistance/impaired                 Upper Body Dressing : Minimal assistance;Standing Upper Body Dressing Details (indicate cue type and reason): OT assisting pt to don gown while standing at RW (pt only wearing t-shirt when OT arrived).     Toilet Transfer: Contact guard assist;BSC/3in1;Rolling walker (2 wheels) Toilet Transfer Details (indicate cue type and reason): verbal cues, increased time. CGA for sit to stand. Toileting- Clothing Manipulation and Hygiene: Total assistance;Sit to/from stand Toileting - Clothing Manipulation Details (indicate cue type and reason): OT having to assist with management of brief and bowel hygiene, as pt fatiguing and needing BIL hands on RW for safety/balance.     Functional mobility during ADLs: Contact guard assist;Rolling walker (2 wheels) General ADL Comments: Pt fatiguing quickly. At beginning of session, pt able to stand x5 minutes, moving BIL LE intermittently in standing and seeing if he needed to urinate. OT assisting pt to don brief in preparation for functional mobility; however pt then stating he needed to have a bowel movement. Pt safely t/f to sitting in recliner with CGA, and OT obtained BSC.  Pt able to walk approx 4 feet forward and turn to [facility] to t/f.    Extremity/Trunk Assessment Upper Extremity Assessment Upper Extremity Assessment: Generalized weakness   Lower Extremity Assessment Lower Extremity Assessment: Generalized weakness        Vision       Perception     Praxis      Cognition Arousal: Alert Behavior  During Therapy: WFL for tasks assessed/performed Overall Cognitive Status: History of cognitive impairments - at baseline (per medical record)                                 General Comments: slow processing        Exercises      Shoulder Instructions       General Comments Pt with pain in gluteal crease; RN in room at end of session to apply barrier cream.    Pertinent Vitals/ Pain       Pain Assessment Pain Assessment: 0-10 Pain Score:  (unrated) Pain Location: R shoulder, pt reports OA Pain Descriptors / Indicators: Discomfort, Grimacing Pain Intervention(s): Limited activity within patient's tolerance, Monitored during session  Home Living                                          Prior Functioning/Environment              Frequency  Min 1X/week        Progress Toward Goals  OT Goals(current goals can now be found in the care plan section)  Progress towards OT goals: Progressing toward goals  Acute Rehab OT Goals Patient Stated Goal: Get better OT Goal Formulation: With patient/family Time For Goal Achievement: 09/30/23 Potential to Achieve Goals: Good ADL Goals Pt Will Perform Grooming: with set-up;with supervision;sitting Pt Will Perform Lower Body Dressing: with min assist;sit to/from stand Pt Will Transfer to Toilet: with modified independence;ambulating;regular height toilet  Plan      Co-evaluation                 AM-PAC OT "6 Clicks" Daily Activity     Outcome Measure   Help from another person eating meals?: None Help from another person taking care of personal grooming?: A Little Help from another person toileting, which includes using toliet, bedpan, or urinal?: A Lot Help from another person bathing (including washing, rinsing, drying)?: A Lot Help from another person to put on and taking off regular upper body clothing?: A Little Help from another person to put on and taking off regular lower  body clothing?: A Lot 6 Click Score: 16    End of Session Equipment Utilized During Treatment: Rolling walker (2 wheels);Other (comment) (BSC)  OT Visit Diagnosis: Other abnormalities of gait and mobility (R26.89);Muscle weakness (generalized) (M62.81)   Activity Tolerance Patient tolerated treatment well;Patient limited by fatigue   Patient Left with chair alarm set;in chair;Other (comment) (RN had just been in room; aware of pt status)   Nurse Communication Other (comment);Mobility status (RN aware of pt status)        Time: 4098-1191 OT Time Calculation (min): 32 min  Charges: OT General Charges $OT Visit: 1 Visit OT Treatments $Self Care/Home Management : 23-37 mins  Linward Foster, MS, OTR/L   Alvester Morin 09/19/2023, 3:33 PM

## 2023-09-19 NOTE — TOC Progression Note (Signed)
Transition of Care [facility]) - Progression Note    Patient Details  Name: Joshua Berry MRN: [id number] Date of Birth: February 24, 1946  Transition of Care [facility]) CM/SW Contact  Allena Katz, LCSW Phone Number: 09/19/2023, 11:23 AM  Clinical Narrative:   CSW gave update to wife that we have not heard from [facility] [place]. Wife would like for referral to be sent to [facility] in [place] as she reports that the [place] is in network with them.         Expected Discharge Plan and Services                                               Social Determinants of Health (SDOH) Interventions SDOH Screenings   Food Insecurity: Patient Unable To Answer (09/15/2023)  Housing: Patient Unable To Answer (09/15/2023)  Transportation Needs: Patient Unable To Answer (09/15/2023)  Recent Concern: Transportation Needs - Unmet Transportation Needs (06/17/2023)  Utilities: Patient Unable To Answer (09/15/2023)  Recent Concern: Utilities - At Risk (06/17/2023)  Depression (PHQ2-9): Low Risk  (04/24/2023)  Financial Resource Strain: Low Risk  (08/06/2023)   Received from [facility]  Social Connections: Unknown (04/29/2022)   Received from [facility], [facility]  Tobacco Use: Medium Risk (09/15/2023)    Readmission Risk Interventions    09/15/2023    8:54 AM 08/14/2023    2:18 PM 07/15/2023    8:52 AM  Readmission Risk Prevention Plan  Transportation Screening Complete Complete Complete  PCP or Specialist Appt within 3-5 Days Complete Complete Complete  HRI or Home Care Consult Complete Complete   Social Work Consult for Recovery Care Planning/Counseling Not Complete Complete Complete  Palliative Care Screening Not Applicable Not Applicable Not Applicable  Medication Review Oceanographer) Not Complete Complete Referral to Pharmacy  Med Review Comments Patient confused and disoriented.

## 2023-09-19 NOTE — Progress Notes (Signed)
Progress Note   [name]:[id number] DOB: Jul 23, 1946 DOA: 09/15/2023     4 DOS: the patient was seen and examined on 09/19/2023   Brief hospital course: Joshua Berry is a 77 y.o. male with medical history significant of NASH, liver cirrhosis, hepatic encephalopathy, esophageal varices, GI bleeding, anemia, thrombocytopenia, HTN, HLD, DM, dCHF, hypothyroidism, OSA not on CPAP, melanoma, s/p of TAVR, recent admission due to subdural hematoma, MRSE bacteremia presenting with fall and hepatic encephalopathy.  Limited history in the setting of encephalopathy.  Per report, patient with fall at local rehab facility.  Noted recent extended admission from August through September 2024 for issues including subdural hematoma, staphylococcal epidermidis bacteremia.  Had protracted course in the [facility] system for for issues including middle meningeal artery embolization.  Per report, patient with noted fall at the rehab.  Daughter reports worsening confusion over the past 2 days.     ED w/up White count 3.6, hemoglobin 10.6, platelets 58, urinalysis within normal limits, creatinine 0.76, glucose 162, T. bili 2. INR 1.4. Ammonia level 102. EKG normal sinus rhythm. CT head with ongoing bilateral mixed density subdural hematomas stable mild intracranial mass effect with right subdural being stable and left subdural being decreased. Left anterior vertex scalp hematoma.    Assessment and Plan: Hepatic encephalopathy  Decompensated hepatic encephalopathy with noted fall  Ammonia level 104 --60 trending down. Continue lactulose and rifaximin  Titrate lactulose to 2-3 bowel movements. Monitor neurochecks.    Subdural hematoma (HCC) + recent fall w/ noted decompensated hepatic encephalopathy  Prior extended hospitalization 7/29-->9/4 for SDH s/p Status post bilateral frontal burr hole and embolization procedure in the [facility] system.  CT head with stable, decreasing SDH  Noted Left anterior vertex scalp  hematoma. Fall precautions. PT follow up. Neurology signed off, no intervention for now.   Fall Positive fall with noted secondary head trauma in setting of decompensated hepatic encephalopathy Stable subdural hematomas on repeat imaging Check orthostatic vital signs when able to Fall precautions PT OT -SNF placement, awaiting auth.   Hypomagnesemia, mag repleted. Monitor electrolytes daily.   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 Monitor daily CBC.   Chronic diastolic CHF (congestive heart failure) (HCC) 2D Echo 07/2023 w/ EF 60-65% and grade 1 DD  Encourage oral diet, fluids. Monitor volume status    HLD (hyperlipidemia) Statin resumed    GERD without esophagitis Continue PPI   Hypothyroidism, TSH 2.8 wnl Cont synthroid    Type 2 diabetes mellitus without complications (HCC) A1c 5.4 on 06/17/23. Blood sugars elevated. Lantus 10 u at bedtime ordered. Continue Accu-Cheks, sliding scale insulin.       Out of bed to chair. Incentive spirometry. Nursing supportive care. Fall, aspiration precautions. DVT prophylaxis- SCD   Code Status: Do not attempt resuscitation (DNR) PRE-ARREST INTERVENTIONS DESIRED  Subjective: Patient is seen and examined today morning. He is sitting in comfortably. Denies amy complaints. He asked me about discharge plan to facility, updated him TOC working on it.   Physical Exam: Vitals:   09/18/23 2031 09/19/23 0419 09/19/23 0724 09/19/23 1618  BP: (!) 110/53 (!) 107/50 (!) 116/59 (!) 117/59  Pulse: 64 66 63 66  Resp: 12 12 18 18   Temp: 98.3 F (36.8 C) 98.3 F (36.8 C) 97.8 F (36.6 C) 98 F (36.7 C)  TempSrc:   Oral   SpO2: 97% 95% 99% 99%  Weight:  Height:        General - Elderly Caucasian male, sitting in no apparent distress HEENT - PERRLA, EOMI, scalp surgical scar, bruises noted, non tender sinuses. Lung - Clear, diffuse rales,  rhonchi, no wheezes. Heart - S1, S2 heard, no murmurs, rubs, trace pedal edema. Abdomen - Soft, non tender, bowel sounds good Neuro - Alert, awake and oriented, able to answer me, non focal exam. Skin - Warm and dry.  Data Reviewed:      Latest Ref Rng & Units 09/17/2023    5:39 AM 09/16/2023    5:39 AM 09/15/2023    3:54 AM  CBC  WBC 4.0 - 10.5 K/uL 2.6  2.8  3.6   Hemoglobin 13.0 - 17.0 g/dL 9.8  9.9  78.4   Hematocrit 39.0 - 52.0 % 28.9  27.6  32.0   Platelets 150 - 400 K/uL 48  52  58       Latest Ref Rng & Units 09/17/2023    5:39 AM 09/16/2023    5:39 AM 09/15/2023    3:54 AM  BMP  Glucose 70 - 99 mg/dL 696  295  284   BUN 8 - 23 mg/dL 10  9  12    Creatinine 0.61 - 1.24 mg/dL 1.32  4.40  1.02   Sodium 135 - 145 mmol/L 137  136  138   Potassium 3.5 - 5.1 mmol/L 3.7  3.5  3.5   Chloride 98 - 111 mmol/L 106  109  107   CO2 22 - 32 mmol/L 22  20  21    Calcium 8.9 - 10.3 mg/dL 8.5  8.7  9.0    No results found.   Family Communication: Wife in touch with TOC, aware of pending placement.   Disposition: Status is: Inpatient Remains inpatient appropriate because: SNF placement, insurance auth  Planned Discharge Destination: Skilled nursing facility     Time spent: 37 minutes  Author: Marcelino Duster, MD 09/19/2023 4:43 PM Secure chat 7am to 7pm For on call review [email or web address].

## 2023-09-19 NOTE — Plan of Care (Signed)

## 2023-09-19 NOTE — [place] FL2 (Signed)
[place] MEDICAID FL2 LEVEL OF CARE FORM     IDENTIFICATION  Patient Name: Joshua Berry Birthdate: [birth year] Sex: male Admission Date (Current Location): 09/15/2023  [place] and [place] Number:  Chiropodist and Address:  [facility], [address] [place], [place] [address]      Provider Number: [id number]  Attending Physician Name and Address:  Marcelino Duster, MD  Relative Name and Phone Number:       Current Level of Care: Hospital Recommended Level of Care: Skilled Nursing Facility Prior Approval Number:    Date Approved/Denied:   PASRR Number: [id number]  Discharge Plan: SNF    Current Diagnoses: Patient Active Problem List   Diagnosis Date Noted   Encephalopathy 09/15/2023   Right wrist pain 08/14/2023   Bilateral subdural hematomas (HCC) 08/13/2023   Malnutrition of moderate degree 08/13/2023   Decubitus ulcer of coccyx, stage I 08/13/2023   Acute metabolic encephalopathy 08/12/2023   Staphylococcus epidermidis bacteremia 07/23/2023   Acute nontraumatic intracranial subdural hematoma (HCC) 07/15/2023   Subdural hematoma (HCC) 07/14/2023   Compression of brain (HCC) 07/14/2023   Uncal herniation (HCC) 07/14/2023   Type 2 diabetes mellitus without complications (HCC) 06/17/2023   GERD without esophagitis 06/17/2023   Dyslipidemia 06/17/2023   Pressure injury of skin 06/17/2023   Acute urinary retention 04/11/2023   Streptococcal bacteremia 04/07/2023   Altered mental status 04/06/2023   Hepatic encephalopathy (HCC) 04/05/2023   Overweight (BMI 25.0-29.9) 08/03/2022   Hyponatremia, hypokalemia and hypomagnesemia 05/18/2022   Hyponatremia 05/18/2022   Hypokalemia 05/18/2022   Back pain 05/17/2022   Hypomagnesemia 05/17/2022   Acute hepatic encephalopathy (HCC) 05/17/2022   AKI (acute kidney injury) (HCC) 05/16/2022   Elevated liver enzymes 05/16/2022   Hypotension 05/16/2022   Right shoulder pain 05/16/2022    Hypothyroidism 05/16/2022   Physical deconditioning 05/16/2022   Obesity (BMI 30-39.9) 05/16/2022   Lactic acidosis 05/16/2022   Fall 05/16/2022   Septic shock due to Staphylococcus bacteremia 05/14/2022   Chest pain 04/06/2022   Controlled IDDM-2 with hyperglycemia 04/06/2022   HLD (hyperlipidemia) 04/06/2022   Chronic diastolic CHF (congestive heart failure) (HCC) 04/06/2022   Pancytopenia (HCC) 04/06/2022   GI bleeding 04/24/2021   Cirrhosis of liver without ascites (HCC)    Secondary esophageal varices without bleeding (HCC)    Portal hypertension (HCC)    Stomach irritation    Melena    Acute gastrointestinal hemorrhage 07/19/2019   Severe sepsis (HCC) 12/20/2018    Orientation RESPIRATION BLADDER Height & Weight     Self, Place  Normal Incontinent Weight: 217 lb 12.8 oz (98.8 kg) Height:  6\' 2"  (188 cm)  BEHAVIORAL SYMPTOMS/MOOD NEUROLOGICAL BOWEL NUTRITION STATUS   (None)  (None) Continent Diet (DYS 3. Please send MINCED MEATS w/ gravy added. Gravy on potatoes. May have baked/sweet potatoes per Speech ok. NO STRAWS!)  AMBULATORY STATUS COMMUNICATION OF NEEDS Skin   Limited Assist Verbally Skin abrasions, Bruising, Other (Comment), PU Stage and Appropriate Care (Erythema/redness, rash.)   PU Stage 2 Dressing:  (Mid coccyx: Foam.)                   Personal Care Assistance Level of Assistance  Bathing, Feeding, Dressing Bathing Assistance: Limited assistance Feeding assistance: Limited assistance Dressing Assistance: Limited assistance     Functional Limitations Info  Sight, Hearing, Speech Sight Info: Adequate Hearing Info: Adequate Speech Info: Adequate    SPECIAL CARE FACTORS FREQUENCY  PT (By licensed PT), OT (By licensed  OT)     PT Frequency: 5 x week OT Frequency: 5 x week            Contractures Contractures Info: Not present    Additional Factors Info  Code Status, Allergies Code Status Info: DNR Allergies Info: Camphor, Latex,  Lisinopril, Sulfamethoxazole, Chocolate, Nsaids           Current Medications (09/19/2023):  This is the current hospital active medication list Current Facility-Administered Medications  Medication Dose Route Frequency Provider Last Rate Last Admin   0.9 %  sodium chloride infusion   Intravenous Continuous Gillis Santa, MD 10 mL/hr at 09/17/23 1811 Infusion Verify at 09/17/23 1811   acetaminophen (TYLENOL) tablet 650 mg  650 mg Oral Q6H PRN Floydene Flock, MD   650 mg at 09/18/23 2123   atorvastatin (LIPITOR) tablet 20 mg  20 mg Oral QHS Marcelino Duster, MD   20 mg at 09/18/23 2102   diphenhydrAMINE (BENADRYL) capsule 25 mg  25 mg Oral Q6H PRN Floydene Flock, MD   25 mg at 09/16/23 0041   furosemide (LASIX) tablet 20 mg  20 mg Oral Daily Marcelino Duster, MD   20 mg at 09/19/23 0837   insulin aspart (novoLOG) injection 0-5 Units  0-5 Units Subcutaneous QHS Gillis Santa, MD   2 Units at 09/18/23 2102   insulin aspart (novoLOG) injection 0-9 Units  0-9 Units Subcutaneous TID WC Gillis Santa, MD   1 Units at 09/19/23 0837   insulin glargine-yfgn (SEMGLEE) injection 10 Units  10 Units Subcutaneous QHS Marcelino Duster, MD   10 Units at 09/18/23 2103   ketoconazole (NIZORAL) 2 % cream   Topical Daily Floydene Flock, MD   Given at 09/19/23 0838   lactulose (CHRONULAC) 10 GM/15ML solution 30 g  30 g Oral TID Floydene Flock, MD   30 g at 09/19/23 0837   levothyroxine (SYNTHROID) tablet 100 mcg  100 mcg Oral Q0600 Gillis Santa, MD   100 mcg at 09/19/23 0533   lidocaine (LIDODERM) 5 % 1 patch  1 patch Transdermal Q24H Marcelino Duster, MD   1 patch at 09/18/23 2104   melatonin tablet 5 mg  5 mg Oral QHS Andris Baumann, MD   5 mg at 09/18/23 2102   multivitamin with minerals tablet 1 tablet  1 tablet Oral Daily Marcelino Duster, MD   1 tablet at 09/19/23 0837   ondansetron (ZOFRAN) tablet 4 mg  4 mg Oral Q6H PRN Floydene Flock, MD       Or   ondansetron [facility])  injection 4 mg  4 mg Intravenous Q6H PRN Floydene Flock, MD       pantoprazole (PROTONIX) EC tablet 20 mg  20 mg Oral Daily Marcelino Duster, MD   20 mg at 09/19/23 0837   rifaximin (XIFAXAN) tablet 550 mg  550 mg Oral BID Floydene Flock, MD   550 mg at 09/19/23 0837   terazosin (HYTRIN) capsule 5 mg  5 mg Oral QHS Andris Baumann, MD   5 mg at 09/18/23 2102   thiamine (VITAMIN B1) tablet 100 mg  100 mg Oral Daily Rejeana Brock, MD   100 mg at 09/19/23 3244     Discharge Medications: Please see discharge summary for a list of discharge medications.  Relevant Imaging Results:  Relevant Lab Results:   Additional Information SS#: [id number]  Margarito Liner, LCSW

## 2023-09-20 DIAGNOSIS — S065XAA Traumatic subdural hemorrhage with loss of consciousness status unknown, initial encounter: Secondary | ICD-10-CM | POA: Diagnosis not present

## 2023-09-20 DIAGNOSIS — W19XXXA Unspecified fall, initial encounter: Secondary | ICD-10-CM | POA: Diagnosis not present

## 2023-09-20 DIAGNOSIS — K7682 Hepatic encephalopathy: Secondary | ICD-10-CM | POA: Diagnosis not present

## 2023-09-20 DIAGNOSIS — D61818 Other pancytopenia: Secondary | ICD-10-CM | POA: Diagnosis not present

## 2023-09-20 LAB — GLUCOSE, CAPILLARY
Glucose-Capillary: 150 mg/dL — ABNORMAL HIGH (ref 70–99)
Glucose-Capillary: 248 mg/dL — ABNORMAL HIGH (ref 70–99)
Glucose-Capillary: 222 mg/dL — ABNORMAL HIGH (ref 70–99)
Glucose-Capillary: 210 mg/dL — ABNORMAL HIGH (ref 70–99)

## 2023-09-20 LAB — CBC
HCT: 27 % — ABNORMAL LOW (ref 39.0–52.0)
Hemoglobin: 9.6 g/dL — ABNORMAL LOW (ref 13.0–17.0)
MCH: 32.9 pg (ref 26.0–34.0)
MCHC: 35.6 g/dL (ref 30.0–36.0)
MCV: 92.5 fL (ref 80.0–100.0)
Platelets: 47 10*3/uL — ABNORMAL LOW (ref 150–400)
RBC: 2.92 MIL/uL — ABNORMAL LOW (ref 4.22–5.81)
RDW: 16.7 % — ABNORMAL HIGH (ref 11.5–15.5)
WBC: 2.4 10*3/uL — ABNORMAL LOW (ref 4.0–10.5)
nRBC: 0 % (ref 0.0–0.2)

## 2023-09-20 LAB — AMMONIA: Ammonia: 74 umol/L — ABNORMAL HIGH (ref 9–35)

## 2023-09-20 NOTE — Plan of Care (Signed)

## 2023-09-20 NOTE — Progress Notes (Signed)
Progress Note   [name] [id number] DOB: 02-13-1946 DOA: 09/15/2023     5 DOS: the patient was seen and examined on 09/20/2023   Brief hospital course: CADDEN DETLOFF is a 77 y.o. male with medical history significant of NASH, liver cirrhosis, hepatic encephalopathy, esophageal varices, GI bleeding, anemia, thrombocytopenia, HTN, HLD, DM, dCHF, hypothyroidism, OSA not on CPAP, melanoma, s/p of TAVR, recent admission due to subdural hematoma, MRSE bacteremia presenting with fall and hepatic encephalopathy.  Limited history in the setting of encephalopathy.  Per report, patient with fall at local rehab facility.  Noted recent extended admission from August through September 2024 for issues including subdural hematoma, staphylococcal epidermidis bacteremia.  Had protracted course in the [facility] system for for issues including middle meningeal artery embolization.  Per report, patient with noted fall at the rehab.  Daughter reports worsening confusion over the past 2 days.     ED w/up White count 3.6, hemoglobin 10.6, platelets 58, urinalysis within normal limits, creatinine 0.76, glucose 162, T. bili 2. INR 1.4. Ammonia level 102. EKG normal sinus rhythm. CT head with ongoing bilateral mixed density subdural hematomas stable mild intracranial mass effect with right subdural being stable and left subdural being decreased. Left anterior vertex scalp hematoma.    Assessment and Plan: Hepatic encephalopathy  Decompensated hepatic encephalopathy with noted fall. Ammonia level trended down. Continue lactulose and rifaximin  Mental status back to baseline.    Subdural hematoma (HCC) + recent fall w/ noted decompensated hepatic encephalopathy  Prior extended hospitalization 7/29-->9/4 for SDH s/p Status post bilateral frontal burr hole and embolization procedure in the [facility] system.  CT head with stable, decreasing SDH  Noted Left anterior vertex scalp hematoma. Fall precautions. PT follow  up. Neurology signed off, no intervention for now.   Fall Positive fall with noted secondary head trauma in setting of decompensated hepatic encephalopathy Stable subdural hematomas on repeat imaging Check orthostatic vital signs when able to Fall precautions PT OT -SNF placement, awaiting auth.   Hypomagnesemia, mag repleted. Monitor electrolytes daily.   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 Monitor daily CBC.   Chronic diastolic CHF (congestive heart failure) (HCC) 2D Echo 07/2023 w/ EF 60-65% and grade 1 DD  Encourage oral diet, fluids. Monitor volume status    HLD (hyperlipidemia) Statin resumed    GERD without esophagitis Continue PPI   Hypothyroidism, TSH 2.8 wnl Cont synthroid    Type 2 diabetes mellitus without complications (HCC) A1c 5.4 on 06/17/23. Blood sugars elevated. Lantus 10 u at bedtime ordered. Continue Accu-Cheks, sliding scale insulin.       Out of bed to chair. Incentive spirometry. Nursing supportive care. Fall, aspiration precautions. DVT prophylaxis- SCD   Code Status: Do not attempt resuscitation (DNR) PRE-ARREST INTERVENTIONS DESIRED  Subjective: Patient is seen and examined today morning. He is sitting in comfortably. Denies any complaints. Wishes to work with PT.   Physical Exam: Vitals:   09/19/23 1618 09/19/23 2027 09/20/23 0404 09/20/23 0805  BP: (!) 117/59 (!) 126/59 (!) 101/53 133/63  Pulse: 66 73 69 64  Resp: 18 16 16 18   Temp: 98 F (36.7 C) 98.2 F (36.8 C) 97.9 F (36.6 C) 98 F (36.7 C)  TempSrc:      SpO2: 99% 99% 97% 97%  Weight:      Height:        General - Elderly Caucasian  male, sitting in no apparent distress HEENT - PERRLA, EOMI, scalp surgical scar, non tender sinuses. Lung - Clear, diffuse rales, rhonchi, no wheezes. Heart - S1, S2 heard, no murmurs, rubs, trace pedal edema. Abdomen - Soft, non tender, bowel  sounds good Neuro - Alert, awake and oriented, able to answer me, non focal exam. Skin - Warm and dry.  Data Reviewed:      Latest Ref Rng & Units 09/20/2023    4:56 AM 09/17/2023    5:39 AM 09/16/2023    5:39 AM  CBC  WBC 4.0 - 10.5 K/uL 2.4  2.6  2.8   Hemoglobin 13.0 - 17.0 g/dL 9.6  9.8  9.9   Hematocrit 39.0 - 52.0 % 27.0  28.9  27.6   Platelets 150 - 400 K/uL 47  48  52       Latest Ref Rng & Units 09/17/2023    5:39 AM 09/16/2023    5:39 AM 09/15/2023    3:54 AM  BMP  Glucose 70 - 99 mg/dL 580  998  338   BUN 8 - 23 mg/dL 10  9  12    Creatinine 0.61 - 1.24 mg/dL 2.50  5.39  7.67   Sodium 135 - 145 mmol/L 137  136  138   Potassium 3.5 - 5.1 mmol/L 3.7  3.5  3.5   Chloride 98 - 111 mmol/L 106  109  107   CO2 22 - 32 mmol/L 22  20  21    Calcium 8.9 - 10.3 mg/dL 8.5  8.7  9.0    No results found.   Family Communication: Wife in touch with TOC, aware of pending placement.   Disposition: Status is: Inpatient Remains inpatient appropriate because: SNF placement, insurance auth  Planned Discharge Destination: Skilled nursing facility     Time spent: 37 minutes  Author: Marcelino Duster, MD 09/20/2023 1:48 PM Secure chat 7am to 7pm For on call review [email or web address].

## 2023-09-21 DIAGNOSIS — D61818 Other pancytopenia: Secondary | ICD-10-CM | POA: Diagnosis not present

## 2023-09-21 DIAGNOSIS — W19XXXA Unspecified fall, initial encounter: Secondary | ICD-10-CM | POA: Diagnosis not present

## 2023-09-21 DIAGNOSIS — K7682 Hepatic encephalopathy: Secondary | ICD-10-CM | POA: Diagnosis not present

## 2023-09-21 DIAGNOSIS — S065XAA Traumatic subdural hemorrhage with loss of consciousness status unknown, initial encounter: Secondary | ICD-10-CM | POA: Diagnosis not present

## 2023-09-21 LAB — GLUCOSE, CAPILLARY
Glucose-Capillary: 169 mg/dL — ABNORMAL HIGH (ref 70–99)
Glucose-Capillary: 280 mg/dL — ABNORMAL HIGH (ref 70–99)
Glucose-Capillary: 218 mg/dL — ABNORMAL HIGH (ref 70–99)
Glucose-Capillary: 245 mg/dL — ABNORMAL HIGH (ref 70–99)

## 2023-09-21 NOTE — Plan of Care (Signed)

## 2023-09-21 NOTE — Progress Notes (Signed)
Progress Note   [name] [id number]:[id number] DOB: 1946-10-01 DOA: 09/15/2023     6 DOS: the patient was seen and examined on 09/21/2023   Brief hospital course: TEJAS HARIRI is a 77 y.o. male with medical history significant of NASH, liver cirrhosis, hepatic encephalopathy, esophageal varices, GI bleeding, anemia, thrombocytopenia, HTN, HLD, DM, dCHF, hypothyroidism, OSA not on CPAP, melanoma, s/p of TAVR, recent admission due to subdural hematoma, MRSE bacteremia presenting with fall and hepatic encephalopathy.  Limited history in the setting of encephalopathy.  Per report, patient with fall at local rehab facility.  Noted recent extended admission from August through September 2024 for issues including subdural hematoma, staphylococcal epidermidis bacteremia.  Had protracted course in the [facility] system for for issues including middle meningeal artery embolization.  Per report, patient with noted fall at the rehab.  Daughter reports worsening confusion over the past 2 days.     ED w/up White count 3.6, hemoglobin 10.6, platelets 58, urinalysis within normal limits, creatinine 0.76, glucose 162, T. bili 2. INR 1.4. Ammonia level 102. EKG normal sinus rhythm. CT head with ongoing bilateral mixed density subdural hematomas stable mild intracranial mass effect with right subdural being stable and left subdural being decreased. Left anterior vertex scalp hematoma.    Assessment and Plan: Hepatic encephalopathy  Decompensated hepatic encephalopathy with noted fall. Ammonia level trended down. Continue lactulose and rifaximin  Mental status back to baseline.    Subdural hematoma (HCC) + recent fall w/ noted decompensated hepatic encephalopathy  Prior extended hospitalization 7/29-->9/4 for SDH s/p Status post bilateral frontal burr hole and embolization procedure in the [facility] system.  CT head with stable, decreasing SDH  Fall precautions. PT follow up. Neurology signed off, no intervention for  now.   Fall Positive fall with noted secondary head trauma in setting of decompensated hepatic encephalopathy Stable subdural hematomas on repeat imaging Fall precautions PT OT -SNF placement, awaiting auth.   Hypomagnesemia, mag repleted. Monitor electrolytes daily.   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 Monitor daily CBC.   Chronic diastolic CHF (congestive heart failure) (HCC) 2D Echo 07/2023 w/ EF 60-65% and grade 1 DD  Encourage oral diet, fluids. Monitor volume status    HLD (hyperlipidemia) Statin resumed    GERD without esophagitis Continue PPI   Hypothyroidism, TSH 2.8 wnl Cont synthroid    Type 2 diabetes mellitus without complications (HCC) A1c 5.4 on 06/17/23. Blood sugars elevated. Lantus 10 u at bedtime ordered. Continue Accu-Cheks, sliding scale insulin.  Active Pressure Injury/Wound(s)     Pressure Ulcer  Duration          Pressure Injury 06/17/23 Coccyx Mid Stage 2 -  Partial thickness loss of dermis presenting as a shallow open injury with a red, pink wound bed without slough. 96 days   Pressure Injury 07/18/23 Buttocks Left;Right Stage 1 -  Intact skin with non-blanchable redness of a localized area usually over a bony prominence. 64 days          Out of bed to chair. Incentive spirometry. Nursing supportive care. Fall, aspiration precautions. DVT prophylaxis- SCD   Code Status: Do not attempt resuscitation (DNR) PRE-ARREST INTERVENTIONS DESIRED  Subjective: Patient is seen and examined today morning. He is sitting in comfortably. Denies any complaints. Wishes to go to facility tomorrow, pending insurance auth.   Physical Exam: Vitals:   09/20/23 1644 09/20/23 2136 09/21/23 0532  09/21/23 0806  BP: (!) 114/59 (!) 114/59 (!) 106/56 (!) 108/57  Pulse: 66 73 64 64  Resp: 16 16 18 16   Temp: 98.7 F (37.1 C) 98.4 F (36.9 C) 98.1 F (36.7 C) 98.2 F  (36.8 C)  TempSrc:  Oral Oral   SpO2: 100% 99% 97% 98%  Weight:      Height:        General - Elderly Caucasian male, sitting in no apparent distress HEENT - PERRLA, EOMI, scalp surgical scar, non tender sinuses. Lung - Clear, diffuse rales, rhonchi, no wheezes. Heart - S1, S2 heard, no murmurs, rubs, trace pedal edema. Abdomen - Soft, non tender, bowel sounds good Neuro - Alert, awake and oriented, able to answer me, non focal exam. Skin - Warm and dry.  Data Reviewed:      Latest Ref Rng & Units 09/20/2023    4:56 AM 09/17/2023    5:39 AM 09/16/2023    5:39 AM  CBC  WBC 4.0 - 10.5 K/uL 2.4  2.6  2.8   Hemoglobin 13.0 - 17.0 g/dL 9.6  9.8  9.9   Hematocrit 39.0 - 52.0 % 27.0  28.9  27.6   Platelets 150 - 400 K/uL 47  48  52       Latest Ref Rng & Units 09/17/2023    5:39 AM 09/16/2023    5:39 AM 09/15/2023    3:54 AM  BMP  Glucose 70 - 99 mg/dL 865  784  696   BUN 8 - 23 mg/dL 10  9  12    Creatinine 0.61 - 1.24 mg/dL 2.95  2.84  1.32   Sodium 135 - 145 mmol/L 137  136  138   Potassium 3.5 - 5.1 mmol/L 3.7  3.5  3.5   Chloride 98 - 111 mmol/L 106  109  107   CO2 22 - 32 mmol/L 22  20  21    Calcium 8.9 - 10.3 mg/dL 8.5  8.7  9.0    No results found.   Family Communication: Wife in touch with TOC, aware of pending placement.   Disposition: Status is: Inpatient Remains inpatient appropriate because: SNF placement, insurance auth  Planned Discharge Destination: Skilled nursing facility     Time spent: 36 minutes  Author: Marcelino Duster, MD 09/21/2023 1:53 PM Secure chat 7am to 7pm For on call review [email or web address].

## 2023-09-22 DIAGNOSIS — S065XAA Traumatic subdural hemorrhage with loss of consciousness status unknown, initial encounter: Secondary | ICD-10-CM | POA: Diagnosis not present

## 2023-09-22 DIAGNOSIS — K7682 Hepatic encephalopathy: Secondary | ICD-10-CM | POA: Diagnosis not present

## 2023-09-22 DIAGNOSIS — W19XXXA Unspecified fall, initial encounter: Secondary | ICD-10-CM | POA: Diagnosis not present

## 2023-09-22 DIAGNOSIS — D61818 Other pancytopenia: Secondary | ICD-10-CM | POA: Diagnosis not present

## 2023-09-22 LAB — GLUCOSE, CAPILLARY
Glucose-Capillary: 155 mg/dL — ABNORMAL HIGH (ref 70–99)
Glucose-Capillary: 352 mg/dL — ABNORMAL HIGH (ref 70–99)
Glucose-Capillary: 253 mg/dL — ABNORMAL HIGH (ref 70–99)
Glucose-Capillary: 301 mg/dL — ABNORMAL HIGH (ref 70–99)

## 2023-09-22 MED ORDER — INSULIN ASPART 100 UNIT/ML IJ SOLN
3.0000 [IU] | Freq: Three times a day (TID) | INTRAMUSCULAR | Status: DC
  Administered 2023-09-22 – 2023-09-23 (×3): 3 [IU] via SUBCUTANEOUS
  Filled 2023-09-22 (×3): qty 1

## 2023-09-22 NOTE — Inpatient Diabetes Management (Signed)
Inpatient Diabetes Program Recommendations  AACE/ADA: New Consensus Statement on Inpatient Glycemic Control (2015)  Target Ranges:  Prepandial:   less than 140 mg/dL      Peak postprandial:   less than 180 mg/dL (1-2 hours)      Critically ill patients:  140 - 180 mg/dL   Lab Results  Component Value Date   GLUCAP 352 (H) 09/22/2023   HGBA1C 5.4 06/17/2023    Latest Reference Range & Units 09/21/23 08:06 09/21/23 11:41 09/21/23 17:01 09/21/23 21:11 09/22/23 08:20 09/22/23 11:36  Glucose-Capillary 70 - 99 mg/dL 782 (H) 956 (H) 213 (H) 245 (H) 155 (H) 352 (H)  (H): Data is abnormally high  Diabetes history: DM2 Outpatient Diabetes medications: Lantus 10 units, Novolog 5 units tid meal coverage Current orders for Inpatient glycemic control: Semglee 10 units, Novolog 0-9 units tid, 0-5 units hs  Inpatient Diabetes Program Recommendations:   Postprandial CBGs elevated. Please consider: -Add Novolog 3 units tid meal coverage if eats 50%  Thank you, Darel Hong E. Tatyanna Cronk, RN, MSN, CDE  Diabetes Coordinator Inpatient Glycemic Control Team Team Pager [phone] (8am-5pm) 09/22/2023 1:18 PM

## 2023-09-22 NOTE — Plan of Care (Signed)

## 2023-09-22 NOTE — Progress Notes (Signed)
Progress Note   [name] [id number] DOB: [birth year] DOA: 09/15/2023     7 DOS: the patient was seen and examined on 09/22/2023   Brief hospital course: Per HPI - JANUEL NUON is a 77 y.o. male with medical history significant of NASH, liver cirrhosis, hepatic encephalopathy, esophageal varices, GI bleeding, anemia, thrombocytopenia, HTN, HLD, DM, dCHF, hypothyroidism, OSA not on CPAP, melanoma, s/p of TAVR, recent admission due to subdural hematoma, MRSE bacteremia presenting with fall and hepatic encephalopathy.  Limited history in the setting of encephalopathy.  Per report, patient with fall at local rehab facility.  Noted recent extended admission from August through September 2024 for issues including subdural hematoma, staphylococcal epidermidis bacteremia.  Had protracted course in the [facility] system for for issues including middle meningeal artery embolization.  Per report, patient with noted fall at the rehab.  Daughter reports worsening confusion over the past 2 days.     ED w/up White count 3.6, hemoglobin 10.6, platelets 58, urinalysis within normal limits, creatinine 0.76, glucose 162, T. bili 2. INR 1.4. Ammonia level 102. EKG normal sinus rhythm. CT head with ongoing bilateral mixed density subdural hematomas stable mild intracranial mass effect with right subdural being stable and left subdural being decreased. Left anterior vertex scalp hematoma.    Assessment and Plan: Hepatic encephalopathy  Decompensated hepatic encephalopathy with noted fall. Ammonia level trended down. Continue lactulose and rifaximin  Mental status back to baseline.    Subdural hematoma (HCC) + recent fall w/ noted decompensated hepatic encephalopathy  Prior extended hospitalization 7/29-->9/4 for SDH s/p Status post bilateral frontal burr hole and embolization procedure in the [facility] system.  CT head with stable, decreasing SDH  Fall precautions. PT follow up. Neurology signed off, no  intervention for now.   S/p Fall Positive fall with noted secondary head trauma in setting of decompensated hepatic encephalopathy Stable subdural hematomas on repeat imaging Fall precautions PT OT -SNF placement, awaiting auth.   Hypomagnesemia, mag repleted. Monitor electrolytes daily.   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 Monitor daily CBC.   Chronic diastolic CHF (congestive heart failure) (HCC) 2D Echo 07/2023 w/ EF 60-65% and grade 1 DD  Encourage oral diet, fluids. Monitor volume status    HLD (hyperlipidemia) Statin resumed    GERD without esophagitis Continue PPI   Hypothyroidism, TSH 2.8 wnl Cont synthroid    Type 2 diabetes mellitus without complications (HCC) A1c 5.4 on 06/17/23. Blood sugars elevated. Lantus 10 u at bedtime ordered. Continue Accu-Cheks, sliding scale insulin.  Active Pressure Injury/Wound(s)     Pressure Ulcer  Duration          Pressure Injury 06/17/23 Coccyx Mid Stage 2 -  Partial thickness loss of dermis presenting as a shallow open injury with a red, pink wound bed without slough. 96 days   Pressure Injury 07/18/23 Buttocks Left;Right Stage 1 -  Intact skin with non-blanchable redness of a localized area usually over a bony prominence. 64 days          Out of bed to chair. Incentive spirometry. Nursing supportive care. Fall, aspiration precautions. DVT prophylaxis- SCD   Code Status: Do not attempt resuscitation (DNR) PRE-ARREST INTERVENTIONS DESIRED  Subjective: Patient is seen and examined today morning. He is lying in bed, comfortable. Denies any complaints. Asked about placement, knows [place] auth pending.   Physical Exam: Vitals:   09/21/23 0806 09/21/23  1700 09/21/23 1951 09/22/23 0538  BP: (!) 108/57 (!) 104/57 116/62 99/66  Pulse: 64 69 74 71  Resp: 16 14 18 18   Temp: 98.2 F (36.8 C)  98.2 F (36.8 C) 98.1 F (36.7 C)   TempSrc:   Oral Oral  SpO2: 98% 99% 99% 95%  Weight:      Height:        General - Elderly Caucasian male, sitting in no apparent distress HEENT - PERRLA, EOMI, scalp surgical scar, non tender sinuses. Lung - Clear, diffuse rales, rhonchi, no wheezes. Heart - S1, S2 heard, no murmurs, rubs, trace pedal edema. Abdomen - Soft, non tender, bowel sounds good Neuro - Alert, awake and oriented, non focal exam. Skin - Warm and dry.  Data Reviewed:      Latest Ref Rng & Units 09/20/2023    4:56 AM 09/17/2023    5:39 AM 09/16/2023    5:39 AM  CBC  WBC 4.0 - 10.5 K/uL 2.4  2.6  2.8   Hemoglobin 13.0 - 17.0 g/dL 9.6  9.8  9.9   Hematocrit 39.0 - 52.0 % 27.0  28.9  27.6   Platelets 150 - 400 K/uL 47  48  52       Latest Ref Rng & Units 09/17/2023    5:39 AM 09/16/2023    5:39 AM 09/15/2023    3:54 AM  BMP  Glucose 70 - 99 mg/dL 161  096  045   BUN 8 - 23 mg/dL 10  9  12    Creatinine 0.61 - 1.24 mg/dL 4.09  8.11  9.14   Sodium 135 - 145 mmol/L 137  136  138   Potassium 3.5 - 5.1 mmol/L 3.7  3.5  3.5   Chloride 98 - 111 mmol/L 106  109  107   CO2 22 - 32 mmol/L 22  20  21    Calcium 8.9 - 10.3 mg/dL 8.5  8.7  9.0    No results found.   Family Communication: family aware TOC working on placement, awaiting [facility].  Disposition: Status is: Inpatient Remains inpatient appropriate because: SNF placement, insurance auth  Planned Discharge Destination: Skilled nursing facility     Time spent: 36 minutes  Author: Marcelino Duster, MD 09/22/2023 2:29 PM Secure chat 7am to 7pm For on call review [email or web address].

## 2023-09-22 NOTE — TOC Progression Note (Addendum)
Transition of Care [facility]) - Progression Note    Patient Details  Name: Joshua Berry MRN: [id number] Date of Birth: [birth year]  [facility] of [facility]) CM/SW Contact  Allena Katz, LCSW Phone Number: 09/22/2023, 9:46 AM  Clinical Narrative:   Second voicemail left with kim stevens at [place]. CSW still has not heard back and has not received a follow up email.   Message also left for [facility].         Expected Discharge Plan and Services                                               Social Determinants of Health (SDOH) Interventions SDOH Screenings   Food Insecurity: Patient Unable To Answer (09/15/2023)  Housing: Patient Unable To Answer (09/15/2023)  Transportation Needs: Patient Unable To Answer (09/15/2023)  Recent Concern: Transportation Needs - Unmet Transportation Needs (06/17/2023)  Utilities: Patient Unable To Answer (09/15/2023)  Recent Concern: Utilities - At Risk (06/17/2023)  Depression (PHQ2-9): Low Risk  (04/24/2023)  Financial Resource Strain: Low Risk  (08/06/2023)   Received from [facility]  Social Connections: Unknown (04/29/2022)   Received from [facility], [facility]  Tobacco Use: Medium Risk (09/15/2023)    Readmission Risk Interventions    09/15/2023    8:54 AM 08/14/2023    2:18 PM 07/15/2023    8:52 AM  Readmission Risk Prevention Plan  Transportation Screening Complete Complete Complete  PCP or Specialist Appt within 3-5 Days Complete Complete Complete  HRI or Home Care Consult Complete Complete   Social Work Consult for Recovery Care Planning/Counseling Not Complete Complete Complete  Palliative Care Screening Not Applicable Not Applicable Not Applicable  Medication Review Oceanographer) Not Complete Complete Referral to Pharmacy  Med Review Comments Patient confused and disoriented.

## 2023-09-22 NOTE — TOC Progression Note (Signed)
Transition of Care [facility]) - Progression Note    Patient Details  Name: Joshua Berry MRN: [id number] Date of Birth: May 18, 1946  Transition [facility]) CM/SW Contact  Allena Katz, LCSW Phone Number: 09/22/2023, 3:02 PM  Clinical Narrative:   CSW spoke with Arlice Colt who reported that pt was approved for 30 days through [employer] and that he could go to any [employer] facility. Tresa Endo is going to email me results.          Expected Discharge Plan and Services                                               Social Determinants of Health (SDOH) Interventions SDOH Screenings   Food Insecurity: Patient Unable To Answer (09/15/2023)  Housing: Patient Unable To Answer (09/15/2023)  Transportation Needs: Patient Unable To Answer (09/15/2023)  Recent Concern: Transportation Needs - Unmet Transportation Needs (06/17/2023)  Utilities: Patient Unable To Answer (09/15/2023)  Recent Concern: Utilities - At Risk (06/17/2023)  Depression (PHQ2-9): Low Risk  (04/24/2023)  Financial Resource Strain: Low Risk  (08/06/2023)   Received from [facility]  Social Connections: Unknown (04/29/2022)   Received from [facility], [facility]  Tobacco Use: Medium Risk (09/15/2023)    Readmission Risk Interventions    09/15/2023    8:54 AM 08/14/2023    2:18 PM 07/15/2023    8:52 AM  Readmission Risk Prevention Plan  Transportation Screening Complete Complete Complete  PCP or Specialist Appt within 3-5 Days Complete Complete Complete  HRI or Home Care Consult Complete Complete   Social Work Consult for Recovery Care Planning/Counseling Not Complete Complete Complete  Palliative Care Screening Not Applicable Not Applicable Not Applicable  Medication Review Oceanographer) Not Complete Complete Referral to Pharmacy  Med Review Comments Patient confused and disoriented.

## 2023-09-22 NOTE — TOC Initial Note (Addendum)
Transition of Care [facility]) - Initial/Assessment Note    Patient Details  Name: Joshua Berry MRN: [id number] Date of Birth: February 20, 1946  Transition of Care [facility]) CM/SW Contact:    Allena Katz, LCSW Phone Number: 09/22/2023, 12:31 PM  Clinical Narrative CSW updated family that no answer from [place]. CSW LVM with kelly keltin [phone] at the [place].                         Patient Goals and CMS Choice            Expected Discharge Plan and Services                                              Prior Living Arrangements/Services                       Activities of Daily Living   ADL Screening (condition at time of admission) Does the patient have a NEW difficulty with bathing/dressing/toileting/self-feeding that is expected to last >3 days?: No Does the patient have a NEW difficulty with getting in/out of bed, walking, or climbing stairs that is expected to last >3 days?: No Does the patient have a NEW difficulty with communication that is expected to last >3 days?: No Is the patient deaf or have difficulty hearing?: No Does the patient have difficulty seeing, even when wearing glasses/contacts?: No Does the patient have difficulty concentrating, remembering, or making decisions?: Yes  Permission Sought/Granted                  Emotional Assessment              Admission diagnosis:  Hepatic encephalopathy (HCC) [K76.82] Encephalopathy [G93.40] Injury of head, initial encounter [S09.90XA] Unwitnessed fall [R29.6] Patient Active Problem List   Diagnosis Date Noted   Encephalopathy 09/15/2023   Right wrist pain 08/14/2023   Bilateral subdural hematomas (HCC) 08/13/2023   Malnutrition of moderate degree 08/13/2023   Decubitus ulcer of coccyx, stage I 08/13/2023   Acute metabolic encephalopathy 08/12/2023   Staphylococcus epidermidis bacteremia 07/23/2023   Acute nontraumatic intracranial subdural hematoma (HCC) 07/15/2023   Subdural  hematoma (HCC) 07/14/2023   Compression of brain (HCC) 07/14/2023   Uncal herniation (HCC) 07/14/2023   Type 2 diabetes mellitus without complications (HCC) 06/17/2023   GERD without esophagitis 06/17/2023   Dyslipidemia 06/17/2023   Pressure injury of skin 06/17/2023   Acute urinary retention 04/11/2023   Streptococcal bacteremia 04/07/2023   Altered mental status 04/06/2023   Hepatic encephalopathy (HCC) 04/05/2023   Overweight (BMI 25.0-29.9) 08/03/2022   Hyponatremia, hypokalemia and hypomagnesemia 05/18/2022   Hyponatremia 05/18/2022   Hypokalemia 05/18/2022   Back pain 05/17/2022   Hypomagnesemia 05/17/2022   Acute hepatic encephalopathy (HCC) 05/17/2022   AKI (acute kidney injury) (HCC) 05/16/2022   Elevated liver enzymes 05/16/2022   Hypotension 05/16/2022   Right shoulder pain 05/16/2022   Hypothyroidism 05/16/2022   Physical deconditioning 05/16/2022   Obesity (BMI 30-39.9) 05/16/2022   Lactic acidosis 05/16/2022   Fall 05/16/2022   Septic shock due to Staphylococcus bacteremia 05/14/2022   Chest pain 04/06/2022   Controlled IDDM-2 with hyperglycemia 04/06/2022   HLD (hyperlipidemia) 04/06/2022   Chronic diastolic CHF (congestive heart failure) (HCC) 04/06/2022   Pancytopenia (HCC) 04/06/2022   GI bleeding 04/24/2021   Cirrhosis  of liver without ascites (HCC)    Secondary esophageal varices without bleeding (HCC)    Portal hypertension (HCC)    Stomach irritation    Melena    Acute gastrointestinal hemorrhage 07/19/2019   Severe sepsis (HCC) 12/20/2018   PCP:  [facility]:   [facility] - [place], [place] - [address] [place] [address] Phone: [phone] Fax: [phone]     Social Determinants of Health (SDOH) Social History: SDOH Screenings   Food Insecurity: Patient Unable To Answer (09/15/2023)  Housing: Patient Unable To Answer (09/15/2023)  Transportation Needs: Patient Unable To Answer  (09/15/2023)  Recent Concern: Transportation Needs - Unmet Transportation Needs (06/17/2023)  Utilities: Patient Unable To Answer (09/15/2023)  Recent Concern: Utilities - At Risk (06/17/2023)  Depression (PHQ2-9): Low Risk  (04/24/2023)  Financial Resource Strain: Low Risk  (08/06/2023)   Received from [facility]  Social Connections: Unknown (04/29/2022)   Received from [facility]  Tobacco Use: Medium Risk (09/15/2023)   SDOH Interventions:     Readmission Risk Interventions    09/15/2023    8:54 AM 08/14/2023    2:18 PM 07/15/2023    8:52 AM  Readmission Risk Prevention Plan  Transportation Screening Complete Complete Complete  PCP or Specialist Appt within 3-5 Days Complete Complete Complete  HRI or Home Care Consult Complete Complete   Social Work Consult for Recovery Care Planning/Counseling Not Complete Complete Complete  Palliative Care Screening Not Applicable Not Applicable Not Applicable  Medication Review Oceanographer) Not Complete Complete Referral to Pharmacy  Med Review Comments Patient confused and disoriented.

## 2023-09-22 NOTE — Progress Notes (Signed)
Physical Therapy Treatment Patient Details Name: Joshua Berry MRN: [id number] DOB: [birth year] Today's Date: 09/22/2023   History of Present Illness Joshua Berry is a 77 y.o. male with medical history significant of NASH, liver cirrhosis, hepatic encephalopathy, esophageal varices, GI bleeding, anemia, thrombocytopenia, HTN, HLD, DM, dCHF, hypothyroidism, OSA not on CPAP, melanoma, s/p TAVR, recent admission due to subdural hematoma, MRSE bacteremia presenting with fall and AMS; admitted for management of hepatic encephalopathy.  Noted recent extended admission from August through September 2024 for issues including subdural hematoma, staphylococcal epidermidis bacteremia.    PT Comments  Pt continues to progress with PT. Primarily limited due to Left chronic knee pain. Pt encouraged to have family bring in his knee support sleeve in order to progress functional mobility.  Transfers have improved depending on height of surface to CGA, as well as gait training with heavy support from Upper body on RW. Current recommendations remain appropriate.    If plan is discharge home, recommend the following: Two people to help with walking and/or transfers;Two people to help with bathing/dressing/bathroom   Can travel by private vehicle     No  Equipment Recommendations  None recommended by PT    Recommendations for Other Services       Precautions / Restrictions Precautions Precautions: Fall Restrictions Weight Bearing Restrictions: No     Mobility  Bed Mobility Overal bed mobility: Needs Assistance Bed Mobility: Rolling, Supine to Sit Rolling: Mod assist, Used rails   Supine to sit: Mod assist, HOB elevated, Used rails          Transfers Overall transfer level: Needs assistance Equipment used: Rolling walker (2 wheels) Transfers: Sit to/from Stand Sit to Stand: Min assist, Contact guard assist           General transfer comment: Pt required assist for hygiene after having a  BM on BSC. Able to help with single UE support to pull shorts up from knee height.    Ambulation/Gait Ambulation/Gait assistance: Contact guard assist Gait Distance (Feet): 35 Feet Assistive device: Rolling walker (2 wheels) Gait Pattern/deviations: Step-to pattern, Ataxic, Trunk flexed, Narrow base of support Gait velocity: decreased     General Gait Details: Gait tolerance affected by L knee pain. Pt has a supportive sleeve he wears at home   Stairs             Wheelchair Mobility     Tilt Bed    Modified Rankin (Stroke Patients Only)       Balance Overall balance assessment: Needs assistance Sitting-balance support: No upper extremity supported, Feet supported Sitting balance-Leahy Scale: Good     Standing balance support: Bilateral upper extremity supported, During functional activity, Reliant on assistive device for balance Standing balance-Leahy Scale: Fair Standing balance comment: Able to ambulate with RW and CGA                            Cognition Arousal: Alert Behavior During Therapy: WFL for tasks assessed/performed Overall Cognitive Status: History of cognitive impairments - at baseline                                 General Comments: slow processing        Exercises General Exercises - Lower Extremity Ankle Circles/Pumps: AROM, Both, 10 reps, Seated Long Arc Quad: AROM, Both, 10 reps, Seated Hip ABduction/ADduction: AROM, Both, 10 reps, Seated Hip  Flexion/Marching: AROM, Both, 10 reps, Seated    General Comments General comments (skin integrity, edema, etc.):  (Pt educated on safe transfers, weight shifting, hand placement, and benefits of continued PT post d/c)      Pertinent Vitals/Pain Pain Assessment Pain Assessment: 0-10 Pain Score: 6  Pain Location: L knee Pain Descriptors / Indicators: Discomfort, Grimacing Pain Intervention(s): Patient requesting pain meds-RN notified, Other (comment) (Pt has a  silicone support sleeve at home)    Home Living                          Prior Function            PT Goals (current goals can now be found in the care plan section) Acute Rehab PT Goals Patient Stated Goal: agreeable to participation with session Progress towards PT goals: Progressing toward goals    Frequency    Min 1X/week      PT Plan      Co-evaluation              AM-PAC PT "6 Clicks" Mobility   Outcome Measure  Help needed turning from your back to your side while in a flat bed without using bedrails?: A Lot Help needed moving from lying on your back to sitting on the side of a flat bed without using bedrails?: A Lot Help needed moving to and from a bed to a chair (including a wheelchair)?: A Little Help needed standing up from a chair using your arms (e.g., wheelchair or bedside chair)?: A Little Help needed to walk in hospital room?: A Little Help needed climbing 3-5 steps with a railing? : Total 6 Click Score: 14    End of Session Equipment Utilized During Treatment: Gait belt Activity Tolerance: Patient tolerated treatment well Patient left: in chair;with call bell/phone within reach;with chair alarm set Nurse Communication: Mobility status;Patient requests pain meds PT Visit Diagnosis: Muscle weakness (generalized) (M62.81);Difficulty in walking, not elsewhere classified (R26.2);Other symptoms and signs involving the nervous system (R29.898)     Time: 4010-2725 PT Time Calculation (min) (ACUTE ONLY): 53 min  Charges:    $Gait Training: 8-22 mins $Therapeutic Exercise: 8-22 mins $Therapeutic Activity: 23-37 mins PT General Charges $$ ACUTE PT VISIT: 1 Visit                    Zadie Cleverly, PTA  Jannet Askew 09/22/2023, 2:28 PM

## 2023-09-23 DIAGNOSIS — W19XXXA Unspecified fall, initial encounter: Secondary | ICD-10-CM | POA: Diagnosis not present

## 2023-09-23 DIAGNOSIS — K7682 Hepatic encephalopathy: Secondary | ICD-10-CM | POA: Diagnosis not present

## 2023-09-23 DIAGNOSIS — S065XAA Traumatic subdural hemorrhage with loss of consciousness status unknown, initial encounter: Secondary | ICD-10-CM | POA: Diagnosis not present

## 2023-09-23 DIAGNOSIS — D61818 Other pancytopenia: Secondary | ICD-10-CM | POA: Diagnosis not present

## 2023-09-23 LAB — BASIC METABOLIC PANEL
Anion gap: 5 (ref 5–15)
BUN: 14 mg/dL (ref 8–23)
CO2: 24 mmol/L (ref 22–32)
Calcium: 8.7 mg/dL — ABNORMAL LOW (ref 8.9–10.3)
Chloride: 107 mmol/L (ref 98–111)
Creatinine, Ser: 0.61 mg/dL (ref 0.61–1.24)
GFR, Estimated: >60 mL/min (ref >60)
Glucose, Bld: 145 mg/dL — ABNORMAL HIGH (ref 70–99)
Potassium: 3.6 mmol/L (ref 3.5–5.1)
Sodium: 136 mmol/L (ref 135–145)

## 2023-09-23 LAB — CBC
HCT: 28.1 % — ABNORMAL LOW (ref 39.0–52.0)
Hemoglobin: 9.8 g/dL — ABNORMAL LOW (ref 13.0–17.0)
MCH: 32.5 pg (ref 26.0–34.0)
MCHC: 34.9 g/dL (ref 30.0–36.0)
MCV: 93 fL (ref 80.0–100.0)
Platelets: 44 10*3/uL — ABNORMAL LOW (ref 150–400)
RBC: 3.02 MIL/uL — ABNORMAL LOW (ref 4.22–5.81)
RDW: 16.1 % — ABNORMAL HIGH (ref 11.5–15.5)
WBC: 2.5 10*3/uL — ABNORMAL LOW (ref 4.0–10.5)
nRBC: 0 % (ref 0.0–0.2)

## 2023-09-23 LAB — GLUCOSE, CAPILLARY
Glucose-Capillary: 142 mg/dL — ABNORMAL HIGH (ref 70–99)
Glucose-Capillary: 204 mg/dL — ABNORMAL HIGH (ref 70–99)
Glucose-Capillary: 269 mg/dL — ABNORMAL HIGH (ref 70–99)
Glucose-Capillary: 214 mg/dL — ABNORMAL HIGH (ref 70–99)

## 2023-09-23 LAB — AMMONIA: Ammonia: 72 umol/L — ABNORMAL HIGH (ref 9–35)

## 2023-09-23 LAB — MAGNESIUM: Magnesium: 1.5 mg/dL — ABNORMAL LOW (ref 1.7–2.4)

## 2023-09-23 MED ORDER — INSULIN ASPART 100 UNIT/ML IJ SOLN
5.0000 [IU] | Freq: Three times a day (TID) | INTRAMUSCULAR | Status: DC
  Administered 2023-09-23 – 2023-09-25 (×6): 5 [IU] via SUBCUTANEOUS
  Filled 2023-09-23 (×6): qty 1

## 2023-09-23 MED ORDER — MAGNESIUM OXIDE -MG SUPPLEMENT 400 (240 MG) MG PO TABS
800.0000 mg | ORAL_TABLET | Freq: Two times a day (BID) | ORAL | Status: DC
  Administered 2023-09-23 – 2023-09-25 (×4): 800 mg via ORAL
  Filled 2023-09-23 (×4): qty 2

## 2023-09-23 MED ORDER — MAGNESIUM OXIDE -MG SUPPLEMENT 400 (240 MG) MG PO TABS
400.0000 mg | ORAL_TABLET | Freq: Every day | ORAL | Status: DC
  Administered 2023-09-23: 400 mg via ORAL
  Filled 2023-09-23: qty 1

## 2023-09-23 NOTE — TOC Progression Note (Signed)
Transition of Care [facility]) - Progression Note    Patient Details  Name: Joshua Berry MRN: [id number] Date of Birth: [birth year]  [facility]) CM/SW Contact  Chapman Fitch, RN Phone Number: 09/23/2023, 3:24 PM  Clinical Narrative:     VM left for [facility] at [facility] to confirm receipt of referral        Expected Discharge Plan and Services                                               Social Determinants of Health (SDOH) Interventions SDOH Screenings   Food Insecurity: Patient Unable To Answer (09/15/2023)  Housing: Patient Unable To Answer (09/15/2023)  Transportation Needs: Patient Unable To Answer (09/15/2023)  Recent Concern: Transportation Needs - Unmet Transportation Needs (06/17/2023)  Utilities: Patient Unable To Answer (09/15/2023)  Recent Concern: Utilities - At Risk (06/17/2023)  Depression (PHQ2-9): Low Risk  (04/24/2023)  Financial Resource Strain: Low Risk  (08/06/2023)   Received from [facility]  Social Connections: Unknown (04/29/2022)   Received from [facility]  Tobacco Use: Medium Risk (09/15/2023)    Readmission Risk Interventions    09/15/2023    8:54 AM 08/14/2023    2:18 PM 07/15/2023    8:52 AM  Readmission Risk Prevention Plan  Transportation Screening Complete Complete Complete  PCP or Specialist Appt within 3-5 Days Complete Complete Complete  HRI or Home Care Consult Complete Complete   Social Work Consult for Recovery Care Planning/Counseling Not Complete Complete Complete  Palliative Care Screening Not Applicable Not Applicable Not Applicable  Medication Review (RN Care Manager) Not Complete Complete Referral to Pharmacy  Med Review Comments Patient confused and disoriented.

## 2023-09-23 NOTE — Plan of Care (Signed)

## 2023-09-23 NOTE — TOC Progression Note (Signed)
Transition of Care [facility]) - Progression Note    Patient Details  Name: DOYAL SARIC MRN: [id number] Date of Birth: 05/29/1946  Transition of Care [facility]) CM/SW Contact  Chapman Fitch, RN Phone Number: 09/23/2023, 1:48 PM  Clinical Narrative:     -   Spoke with Lowry Ram at [facility].  She states that all 296 facilities on the optum list are in network  - Spoke with Psychiatric nurse at [facility].  She confirms they are in network with [employer].  She states that they are waiting on approval for an MD from [facility] to oversee care.  She has spoken with Gershon Cull at the [facility] and is waiting to hear back  - Patient requesting update.  Met patient in room.  He called step daughter Vallery Sa while I was in the room.  He request for her to be point of contact.  List of in network facilities emailed to [employer] (listed as [facility] the [employer]) and [facility] are their preferred facility.   - Called and spoke with Tour manager at [employer].  She confirms they are in network with [employer] and referral was faxed to [phone]        Expected Discharge Plan and Services                                               Social Determinants of Health (SDOH) Interventions SDOH Screenings   Food Insecurity: Patient Unable To Answer (09/15/2023)  Housing: Patient Unable To Answer (09/15/2023)  Transportation Needs: Patient Unable To Answer (09/15/2023)  Recent Concern: Transportation Needs - Unmet Transportation Needs (06/17/2023)  Utilities: Patient Unable To Answer (09/15/2023)  Recent Concern: Utilities - At Risk (06/17/2023)  Depression (PHQ2-9): Low Risk  (04/24/2023)  Financial Resource Strain: Low Risk  (08/06/2023)   Received from [facility]  Social Connections: Unknown (04/29/2022)   Received from [facility]  Tobacco Use: Medium Risk (09/15/2023)    Readmission Risk Interventions    09/15/2023    8:54 AM 08/14/2023     2:18 PM 07/15/2023    8:52 AM  Readmission Risk Prevention Plan  Transportation Screening Complete Complete Complete  PCP or Specialist Appt within 3-5 Days Complete Complete Complete  HRI or Home Care Consult Complete Complete   Social Work Consult for Recovery Care Planning/Counseling Not Complete Complete Complete  Palliative Care Screening Not Applicable Not Applicable Not Applicable  Medication Review Oceanographer) Not Complete Complete Referral to Pharmacy  Med Review Comments Patient confused and disoriented.

## 2023-09-23 NOTE — Progress Notes (Signed)
Progress Note   [name] ZOX:[id number] DOB: 11-03-1946 DOA: 09/15/2023     8 DOS: the patient was seen and examined on 09/23/2023   Brief hospital course: Per HPI - CALOGERO BOOSE is a 77 y.o. male with medical history significant of NASH, liver cirrhosis, hepatic encephalopathy, esophageal varices, GI bleeding, anemia, thrombocytopenia, HTN, HLD, DM, dCHF, hypothyroidism, OSA not on CPAP, melanoma, s/p of TAVR, recent admission due to subdural hematoma, MRSE bacteremia presenting with fall and hepatic encephalopathy.  Limited history in the setting of encephalopathy.  Per report, patient with fall at local rehab facility.  Noted recent extended admission from August through September 2024 for issues including subdural hematoma, staphylococcal epidermidis bacteremia.  Had protracted course in the [facility] system for for issues including middle meningeal artery embolization.  Per report, patient with noted fall at the rehab.  Daughter reports worsening confusion over the past 2 days.     ED w/up White count 3.6, hemoglobin 10.6, platelets 58, urinalysis within normal limits, creatinine 0.76, glucose 162, T. bili 2. INR 1.4. Ammonia level 102. EKG normal sinus rhythm. CT head with ongoing bilateral mixed density subdural hematomas stable mild intracranial mass effect with right subdural being stable and left subdural being decreased. Left anterior vertex scalp hematoma.    Assessment and Plan: Hepatic encephalopathy  Decompensated hepatic encephalopathy with noted fall. Ammonia level 72 stable. Mental status stable at baseline. Continue lactulose and rifaximin     Subdural hematoma (HCC) + recent fall w/ noted decompensated hepatic encephalopathy  Prior extended hospitalization 7/29-->9/4 for SDH s/p Status post bilateral frontal burr hole and embolization procedure in the [facility] system.  CT head with stable, decreasing SDH  Fall precautions. PT follow up. Neurology signed off, no  intervention for now.   S/p Fall Positive fall with noted secondary head trauma in setting of decompensated hepatic encephalopathy Stable subdural hematomas on repeat imaging Fall precautions PT OT - Awaiting SNF placement   Hypomagnesemia,  Daily mag repletion ordered. [facility] DC papers reviewed. Mag ox 800 bid to be resumed.   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 Monitor CBC weekly.   Chronic diastolic CHF (congestive heart failure) (HCC) 2D Echo 07/2023 w/ EF 60-65% and grade 1 DD  Encourage oral diet, fluids. Monitor volume status    HLD (hyperlipidemia) On Lipitor 20 QHS   GERD without esophagitis Continue PPI   Hypothyroidism, TSH 2.8 wnl Cont synthroid    Type 2 diabetes mellitus without complications (HCC) Repeat A1C ordered. Add to AM labs Blood sugars elevated. Lantus 10 u at bedtime. Premeal insulin increased to 5 u TID with meals Continue Accu-Cheks, sliding scale insulin.  Active Pressure Injury/Wound(s)     Pressure Ulcer  Duration          Pressure Injury 06/17/23 Coccyx Mid Stage 2 -  Partial thickness loss of dermis presenting as a shallow open injury with a red, pink wound bed without slough. 96 days   Pressure Injury 07/18/23 Buttocks Left;Right Stage 1 -  Intact skin with non-blanchable redness of a localized area usually over a bony prominence. 64 days          Out of bed to chair. Incentive spirometry. Nursing supportive care. Fall, aspiration precautions. DVT prophylaxis- SCD   Code Status: Do not attempt resuscitation (DNR) PRE-ARREST INTERVENTIONS DESIRED  Subjective: Patient is seen and examined today morning. He is lying  in bed. Denies any complaints. Getting out of bed to chair. Asked about magnesium replacement, showed [facility] discharge papers on his phone, states he took mag ox 800mg  bid.   Physical Exam: Vitals:   09/22/23 1649 09/22/23 1915  09/23/23 0222 09/23/23 0821  BP: 117/61 (!) 114/59 (!) 113/58 (!) 119/55  Pulse: 75 70 68 66  Resp: 19 18 16 16   Temp: 98.3 F (36.8 C) 99.1 F (37.3 C) 98.1 F (36.7 C) 97.8 F (36.6 C)  TempSrc: Oral Oral Oral Oral  SpO2: 100% 99% 99% 100%  Weight:      Height:        General - Elderly Caucasian male, sitting in no apparent distress HEENT - PERRLA, EOMI, scalp surgical scar, non tender sinuses. Lung - Clear, diffuse rales, rhonchi, no wheezes. Heart - S1, S2 heard, no murmurs, rubs, trace pedal edema. Abdomen - Soft, non tender, bowel sounds good Neuro - Alert, awake and oriented, non focal exam. Skin - Warm and dry.  Data Reviewed:      Latest Ref Rng & Units 09/23/2023    5:28 AM 09/20/2023    4:56 AM 09/17/2023    5:39 AM  CBC  WBC 4.0 - 10.5 K/uL 2.5  2.4  2.6   Hemoglobin 13.0 - 17.0 g/dL 9.8  9.6  9.8   Hematocrit 39.0 - 52.0 % 28.1  27.0  28.9   Platelets 150 - 400 K/uL 44  47  48       Latest Ref Rng & Units 09/23/2023    5:28 AM 09/17/2023    5:39 AM 09/16/2023    5:39 AM  BMP  Glucose 70 - 99 mg/dL 409  811  914   BUN 8 - 23 mg/dL 14  10  9    Creatinine 0.61 - 1.24 mg/dL 7.82  9.56  2.13   Sodium 135 - 145 mmol/L 136  137  136   Potassium 3.5 - 5.1 mmol/L 3.6  3.7  3.5   Chloride 98 - 111 mmol/L 107  106  109   CO2 22 - 32 mmol/L 24  22  20    Calcium 8.9 - 10.3 mg/dL 8.7  8.5  8.7    No results found.   Family Communication: family aware TOC working on placement.  Disposition: Status is: Inpatient Remains inpatient appropriate because: SNF placement  Planned Discharge Destination: Skilled nursing facility     Time spent: 37 minutes  Author: Marcelino Duster, MD 09/23/2023 1:19 PM Secure chat 7am to 7pm For on call review [email or web address].

## 2023-09-23 NOTE — Progress Notes (Signed)
Physical Therapy Treatment Patient Details Name: Joshua Berry MRN: [id number] DOB: November 12, 1946 Today's Date: 09/23/2023   History of Present Illness Joshua Berry is a 77 y.o. male with medical history significant of NASH, liver cirrhosis, hepatic encephalopathy, esophageal varices, GI bleeding, anemia, thrombocytopenia, HTN, HLD, DM, dCHF, hypothyroidism, OSA not on CPAP, melanoma, s/p TAVR, recent admission due to subdural hematoma, MRSE bacteremia presenting with fall and AMS; admitted for management of hepatic encephalopathy.  Noted recent extended admission from August through September 2024 for issues including subdural hematoma, staphylococcal epidermidis bacteremia.    PT Comments  Pt received seated in recliner upon arrival to room and pt agreeable to therapy.  Pt needed encouragement at first, however agreed after gentle persuasion.  Pt demonstrated improved STS needing minA from therapist to come upright from recliner.  Verbal cues needed for hand placement on first attempt, however on the second STS when donning pants, he was able to perform without therapist assistance and without any formal cuing.  Pt ambulated in the hallway and does still have tendency to have forward flexed gait and looking to ground.  Pt is able to correct posture with verbal cues, however returns to poor statue after 4-5 steps.  Pt returned to recliner upon conclusion and was given new pants to donn as the old ones had been saturated by urine.  Pt left with all needs met and call bell within reach.    If plan is discharge home, recommend the following: A little help with walking and/or transfers;A little help with bathing/dressing/bathroom;Assistance with cooking/housework;Assist for transportation;Help with stairs or ramp for entrance   Can travel by private vehicle     No  Equipment Recommendations  None recommended by PT    Recommendations for Other Services       Precautions / Restrictions  Precautions Precautions: Fall Restrictions Weight Bearing Restrictions: No     Mobility  Bed Mobility               General bed mobility comments: Not tested; pt received in recliner and left in recliner at end of session    Transfers Overall transfer level: Needs assistance Equipment used: Rolling walker (2 wheels) Transfers: Sit to/from Stand Sit to Stand: Contact guard assist           General transfer comment: initial MIN A improves to CGA second trial    Ambulation/Gait   Gait Distance (Feet): 100 Feet Assistive device: Rolling walker (2 wheels) Gait Pattern/deviations: Step-to pattern, Ataxic, Trunk flexed, Narrow base of support Gait velocity: decreased     General Gait Details: Gait tolerance affected by L knee pain. Pt has a supportive sleeve he wears at home   Stairs             Wheelchair Mobility     Tilt Bed    Modified Rankin (Stroke Patients Only)       Balance Overall balance assessment: Needs assistance Sitting-balance support: No upper extremity supported, Feet supported Sitting balance-Leahy Scale: Good     Standing balance support: Single extremity supported, During functional activity Standing balance-Leahy Scale: Fair                              Cognition Arousal: Alert Behavior During Therapy: WFL for tasks assessed/performed Overall Cognitive Status: History of cognitive impairments - at baseline  Exercises      General Comments        Pertinent Vitals/Pain Pain Assessment Pain Assessment: Faces Faces Pain Scale: Hurts a little bit Pain Location: L knee Pain Descriptors / Indicators: Discomfort, Grimacing Pain Intervention(s): Monitored during session    Home Living                          Prior Function            PT Goals (current goals can now be found in the care plan section) Acute Rehab PT Goals Patient  Stated Goal: agreeable to participation with session PT Goal Formulation: With patient/family Time For Goal Achievement: 09/30/23 Potential to Achieve Goals: Fair Progress towards PT goals: Progressing toward goals    Frequency    Min 1X/week      PT Plan      Co-evaluation              AM-PAC PT "6 Clicks" Mobility   Outcome Measure  Help needed turning from your back to your side while in a flat bed without using bedrails?: A Lot Help needed moving from lying on your back to sitting on the side of a flat bed without using bedrails?: A Lot Help needed moving to and from a bed to a chair (including a wheelchair)?: A Little Help needed standing up from a chair using your arms (e.g., wheelchair or bedside chair)?: A Little Help needed to walk in hospital room?: A Little Help needed climbing 3-5 steps with a railing? : Total 6 Click Score: 14    End of Session Equipment Utilized During Treatment: Gait belt Activity Tolerance: Patient tolerated treatment well Patient left: in chair;with call bell/phone within reach;with chair alarm set Nurse Communication: Mobility status;Patient requests pain meds PT Visit Diagnosis: Muscle weakness (generalized) (M62.81);Difficulty in walking, not elsewhere classified (R26.2);Other symptoms and signs involving the nervous system (R29.898)     Time: 4034-7425 PT Time Calculation (min) (ACUTE ONLY): 26 min  Charges:    $Therapeutic Activity: 23-37 mins PT General Charges $$ ACUTE PT VISIT: 1 Visit                     Nolon Bussing, PT, DPT Physical Therapist - [facility]  09/23/23, 4:23 PM

## 2023-09-23 NOTE — Inpatient Diabetes Management (Signed)
Inpatient Diabetes Program Recommendations  AACE/ADA: New Consensus Statement on Inpatient Glycemic Control (2015)  Target Ranges:  Prepandial:   less than 140 mg/dL      Peak postprandial:   less than 180 mg/dL (1-2 hours)      Critically ill patients:  140 - 180 mg/dL   Lab Results  Component Value Date   GLUCAP 204 (H) 09/23/2023   HGBA1C 5.4 06/17/2023    Latest Reference Range & Units 09/22/23 08:20 09/22/23 11:36 09/22/23 17:18 09/22/23 19:19 09/23/23 08:22 09/23/23 11:20  Glucose-Capillary 70 - 99 mg/dL 161 (H) 096 (H) 045 (H) 301 (H) 142 (H) 204 (H)  (H): Data is abnormally high  Review of Glycemic Control  Diabetes history: DM2 Outpatient Diabetes medications: Lantus 10 units, Novolog 5 units tid meal coverage Current orders for Inpatient glycemic control: Semglee 10 units, Novolog 3 units tid meal coverage, Novolog 0-9 units tid, 0-5 units hs   Inpatient Diabetes Program Recommendations:   Postprandial CBGs remain elevated. Please consider: -Increase Novolog to 5 units tid meal coverage if eats 50%  Thank you, Darel Hong E. Atticus Lemberger, RN, MSN, CDE  Diabetes Coordinator Inpatient Glycemic Control Team Team Pager [phone] (8am-5pm) 09/23/2023 1:07 PM

## 2023-09-23 NOTE — Progress Notes (Signed)
Occupational Therapy Treatment Patient Details Name: Joshua Berry MRN: [id number] DOB: September 21, 1946 Today's Date: 09/23/2023   History of present illness Joshua Berry is a 77 y.o. male with medical history significant of NASH, liver cirrhosis, hepatic encephalopathy, esophageal varices, GI bleeding, anemia, thrombocytopenia, HTN, HLD, DM, dCHF, hypothyroidism, OSA not on CPAP, melanoma, s/p TAVR, recent admission due to subdural hematoma, MRSE bacteremia presenting with fall and AMS; admitted for management of hepatic encephalopathy.  Noted recent extended admission from August through September 2024 for issues including subdural hematoma, staphylococcal epidermidis bacteremia.   OT comments  [name] was seen for OT treatment on this date. Upon arrival to room pt seated in chair, agreeable to tx. Pt requires MIN A initial sit<>stand, improves to CGA second trial. Tolerates ~40 ft mobility and standing/seated therex as described below. Pt making good progress toward goals, will continue to follow POC. Discharge recommendation remains appropriate.        If plan is discharge home, recommend the following:  A lot of help with bathing/dressing/bathroom;Help with stairs or ramp for entrance;A lot of help with walking and/or transfers   Equipment Recommendations  Other (comment) (defer)    Recommendations for Other Services      Precautions / Restrictions Precautions Precautions: Fall Restrictions Weight Bearing Restrictions: No       Mobility Bed Mobility               General bed mobility comments: Not tested; pt received in recliner and left in recliner at end of session    Transfers Overall transfer level: Needs assistance Equipment used: Rolling walker (2 wheels) Transfers: Sit to/from Stand Sit to Stand: Contact guard assist           General transfer comment: initial MIN A improves to CGA second trial     Balance Overall balance assessment: Needs  assistance Sitting-balance support: No upper extremity supported, Feet supported Sitting balance-Leahy Scale: Good     Standing balance support: Single extremity supported, During functional activity Standing balance-Leahy Scale: Fair                             ADL either performed or assessed with clinical judgement   ADL Overall ADL's : Needs assistance/impaired                                       General ADL Comments: MAX A don B socks in sitting      Cognition Arousal: Alert Behavior During Therapy: WFL for tasks assessed/performed Overall Cognitive Status: History of cognitive impairments - at baseline                                          Exercises Exercises: General Lower Extremity General Exercises - Lower Extremity Gluteal Sets: AROM, Strengthening, Both, 10 reps, Seated Long Arc Quad: AROM, Strengthening, Both, 10 reps, Seated Hip ABduction/ADduction: AROM, Strengthening, Both, 10 reps, Seated Straight Leg Raises: AROM, Strengthening, Both, 10 reps, Seated Hip Flexion/Marching: AROM, Strengthening, Both, 10 reps, Seated Heel Raises: AROM, Strengthening, Both, 10 reps, Seated Mini-Sqauts: AROM, Strengthening, Both, 10 reps, Standing            Pertinent Vitals/ Pain       Pain Assessment Pain Assessment: No/denies  pain   Frequency  Min 1X/week        Progress Toward Goals  OT Goals(current goals can now be found in the care plan section)  Progress towards OT goals: Progressing toward goals  Acute Rehab OT Goals Patient Stated Goal: to go home OT Goal Formulation: With patient/family Time For Goal Achievement: 09/30/23 Potential to Achieve Goals: Good ADL Goals Pt Will Perform Grooming: with set-up;with supervision;sitting Pt Will Perform Lower Body Dressing: with min assist;sit to/from stand Pt Will Transfer to Toilet: with modified independence;ambulating;regular height toilet  Plan       Co-evaluation                 AM-PAC OT "6 Clicks" Daily Activity     Outcome Measure   Help from another person eating meals?: None Help from another person taking care of personal grooming?: A Little Help from another person toileting, which includes using toliet, bedpan, or urinal?: A Lot Help from another person bathing (including washing, rinsing, drying)?: A Lot Help from another person to put on and taking off regular upper body clothing?: A Little Help from another person to put on and taking off regular lower body clothing?: A Lot 6 Click Score: 16    End of Session Equipment Utilized During Treatment: Rolling walker (2 wheels)  OT Visit Diagnosis: Other abnormalities of gait and mobility (R26.89);Muscle weakness (generalized) (M62.81)   Activity Tolerance Patient tolerated treatment well   Patient Left in chair;with call bell/phone within reach   Nurse Communication          Time: 0932-3557 OT Time Calculation (min): 13 min  Charges: OT General Charges $OT Visit: 1 Visit OT Treatments $Therapeutic Exercise: 8-22 mins  Kathie Dike, M.S. OTR/L  09/23/23, 3:37 PM  ascom [phone]

## 2023-09-23 NOTE — Progress Notes (Signed)
Patient transferred to room 203 with personal belongings including clothing, grocery bag of snacks, cell phone, & cell phone charger.

## 2023-09-24 DIAGNOSIS — K7682 Hepatic encephalopathy: Secondary | ICD-10-CM | POA: Diagnosis not present

## 2023-09-24 DIAGNOSIS — S0990XA Unspecified injury of head, initial encounter: Secondary | ICD-10-CM

## 2023-09-24 DIAGNOSIS — R296 Repeated falls: Secondary | ICD-10-CM

## 2023-09-24 LAB — MAGNESIUM: Magnesium: 1.6 mg/dL — ABNORMAL LOW (ref 1.7–2.4)

## 2023-09-24 LAB — GLUCOSE, CAPILLARY
Glucose-Capillary: 163 mg/dL — ABNORMAL HIGH (ref 70–99)
Glucose-Capillary: 250 mg/dL — ABNORMAL HIGH (ref 70–99)
Glucose-Capillary: 259 mg/dL — ABNORMAL HIGH (ref 70–99)
Glucose-Capillary: 210 mg/dL — ABNORMAL HIGH (ref 70–99)

## 2023-09-24 LAB — HEMOGLOBIN A1C
Hgb A1c MFr Bld: 5.9 % — ABNORMAL HIGH (ref 4.8–5.6)
Mean Plasma Glucose: 122.63 mg/dL

## 2023-09-24 MED ORDER — MAGNESIUM SULFATE 2 GM/50ML IV SOLN
2.0000 g | Freq: Once | INTRAVENOUS | Status: AC
  Administered 2023-09-24: 2 g via INTRAVENOUS
  Filled 2023-09-24: qty 50

## 2023-09-24 NOTE — TOC Progression Note (Addendum)
Transition of Care [facility]) - Progression Note    Patient Details  Name: Joshua Berry MRN: [id number] Date of Birth: 27-Feb-1946  [facility]) CM/SW Contact  Chapman Fitch, RN Phone Number: 09/24/2023, 8:55 AM  Clinical Narrative:        - Per Debra at [facility] she does not have an update from the [place] as of yet - Merial from [facility] confirms she has the referral and will review today - Message sent to [place] at [facility] to confirm if they are in network with [employer]    [facility] is in network with [employer].  Received call from step daughter who state they are not interested in referral being sent to [facility]  Expected Discharge Plan and Services                                               Social Determinants of Health (SDOH) Interventions SDOH Screenings   Food Insecurity: Patient Unable To Answer (09/15/2023)  Housing: Patient Unable To Answer (09/15/2023)  Transportation Needs: Patient Unable To Answer (09/15/2023)  Recent Concern: Transportation Needs - Unmet Transportation Needs (06/17/2023)  Utilities: Patient Unable To Answer (09/15/2023)  Recent Concern: Utilities - At Risk (06/17/2023)  Depression (PHQ2-9): Low Risk  (04/24/2023)  Financial Resource Strain: Low Risk  (08/06/2023)   Received from [facility]  Social Connections: Unknown (04/29/2022)   Received from [facility]  Tobacco Use: Medium Risk (09/15/2023)    Readmission Risk Interventions    09/15/2023    8:54 AM 08/14/2023    2:18 PM 07/15/2023    8:52 AM  Readmission Risk Prevention Plan  Transportation Screening Complete Complete Complete  PCP or Specialist Appt within 3-5 Days Complete Complete Complete  HRI or Home Care Consult Complete Complete   Social Work Consult for Recovery Care Planning/Counseling Not Complete Complete Complete  Palliative Care Screening Not Applicable Not Applicable Not Applicable  Medication  Review Oceanographer) Not Complete Complete Referral to Pharmacy  Med Review Comments Patient confused and disoriented.

## 2023-09-24 NOTE — Progress Notes (Addendum)
Progress Note   [name] QMV:[id number] DOB: 02-Jul-1946 DOA: 09/15/2023     9 DOS: the patient was seen and examined on 09/24/2023  Subjective:   Patient seen and examined at bedside this morning Denies nausea vomiting abdominal pain Patient is frustrated that he is still [facility] I had a long discussion with patient as well as transition of care manager today   Brief hospital course: Per HPI - Joshua Berry is a 77 y.o. male with medical history significant of NASH, liver cirrhosis, hepatic encephalopathy, esophageal varices, GI bleeding, anemia, thrombocytopenia, HTN, HLD, DM, dCHF, hypothyroidism, OSA not on CPAP, melanoma, s/p of TAVR, recent admission due to subdural hematoma, MRSE bacteremia presenting with fall and hepatic encephalopathy.  Limited history in the setting of encephalopathy.  Per report, patient with fall at local rehab facility.  Noted recent extended admission from August through September 2024 for issues including subdural hematoma, staphylococcal epidermidis bacteremia.  Had protracted course in the [facility] system for for issues including middle meningeal artery embolization.  Per report, patient with noted fall at the rehab.  Daughter reports worsening confusion over the past 2 days.     ED w/up White count 3.6, hemoglobin 10.6, platelets 58, urinalysis within normal limits, creatinine 0.76, glucose 162, T. bili 2. INR 1.4. Ammonia level 102. EKG normal sinus rhythm. CT head with ongoing bilateral mixed density subdural hematomas stable mild intracranial mass effect with right subdural being stable and left subdural being decreased. Left anterior vertex scalp hematoma.    Assessment and Plan: Hepatic encephalopathy  Decompensated hepatic encephalopathy with noted fall. Ammonia level 72 stable. Mental status stable at baseline. Continue lactulose and rifaximin     Subdural hematoma (HCC) + recent fall w/ noted  decompensated hepatic encephalopathy  Prior extended hospitalization 7/29-->9/4 for SDH s/p Status post bilateral frontal burr hole and embolization procedure in the [facility] system.  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 Continue to monitor CBC   Chronic diastolic CHF (congestive heart failure) (HCC) 2D Echo 07/2023 w/ EF 60-65% and grade 1 DD  Encourage oral diet, fluids. Monitor volume status    HLD (hyperlipidemia) Continue on Lipitor 20 QHS   GERD without esophagitis Continue PPI therapy   Hypothyroidism, TSH 2.8 wnl Continue levothyroxine   Type 2 diabetes mellitus without complications (HCC) Repeat A1C ordered. Add to AM labs Blood sugars elevated. Lantus 10 u at bedtime. Premeal insulin increased to 5 u TID with meals Continue Accu-Cheks, sliding scale insulin.   Active Pressure Injury/Wound(s)       Pressure Ulcer  Duration             Pressure Injury 06/17/23 Coccyx Mid Stage 2 -  Partial thickness loss of dermis presenting as a shallow open injury with a red, pink wound bed without slough. 96 days    Pressure Injury 07/18/23 Buttocks Left;Right Stage 1 -  Intact skin with non-blanchable redness of a  localized area usually over a bony prominence. 64 days               DVT prophylaxis- SCD    Code Status: Do not attempt resuscitation (DNR) PRE-ARREST INTERVENTIONS DESIRED    Family Communication: family aware TOC working on placement.    Disposition: Status is: Inpatient Remains inpatient appropriate because: SNF placement  Planned Discharge Destination: Skilled nursing facility     Time spent: 37 minutes   Physical Exam:  General -[place] male laying in bed in no acute distress HEENT - PERRLA, EOMI, scalp surgical scar, non tender sinuses. Lung - Clear, diffuse rales, rhonchi, no wheezes. Heart - S1, S2 heard, no murmurs, rubs, trace pedal edema. Abdomen - Soft, non tender, bowel sounds good Neuro - Alert, awake and oriented, non focal exam. Skin - Warm and dry.   Data Reviewed: I have reviewed patient's previous records as well as previous provider documentation as well as below mentioned labs and vitals    Latest Ref Rng & Units 09/23/2023    5:28 AM 09/20/2023    4:56 AM 09/17/2023    5:39 AM  CBC  WBC 4.0 - 10.5 K/uL 2.5  2.4  2.6   Hemoglobin 13.0 - 17.0 g/dL 9.8  9.6  9.8   Hematocrit 39.0 - 52.0 % 28.1  27.0  28.9   Platelets 150 - 400 K/uL 44  47  48        Latest Ref Rng & Units 09/23/2023    5:28 AM 09/17/2023    5:39 AM 09/16/2023    5:39 AM  BMP  Glucose 70 - 99 mg/dL 401  027  253   BUN 8 - 23 mg/dL 14  10  9    Creatinine 0.61 - 1.24 mg/dL 6.64  4.03  4.74   Sodium 135 - 145 mmol/L 136  137  136   Potassium 3.5 - 5.1 mmol/L 3.6  3.7  3.5   Chloride 98 - 111 mmol/L 107  106  109   CO2 22 - 32 mmol/L 24  22  20    Calcium 8.9 - 10.3 mg/dL 8.7  8.5  8.7       Vitals:   09/23/23 2004 09/24/23 0335 09/24/23 0835 09/24/23 1529  BP: (!) 111/59 112/63 125/63 117/63  Pulse: 72 74 70 72  Resp: 18 18 18 16   Temp: 99.4 F (37.4 C) 98.1 F (36.7 C) 98.2 F (36.8 C) 98.7 F (37.1 C)  TempSrc: Oral Oral Oral   SpO2: 96% 97% 98% 99%  Weight:      Height:         Author: Loyce Dys, MD 09/24/2023 5:09 PM  For on call review [email or web address].

## 2023-09-24 NOTE — Plan of Care (Signed)

## 2023-09-24 NOTE — Plan of Care (Signed)

## 2023-09-24 NOTE — TOC Progression Note (Signed)
Transition of Care [facility]) - Progression Note    Patient Details  Name: Joshua Berry MRN: [id number] Date of Birth: [birth year]  Transition of Care [facility]) CM/SW Contact  Chapman Fitch, RN Phone Number: 09/24/2023, 2:00 PM  Clinical Narrative:          [facility] at [facility], and Merial at [facility] still waiting on confirmation from [place]  Expected Discharge Plan and Services                                               Social Determinants of Health (SDOH) Interventions SDOH Screenings   Food Insecurity: Patient Unable To Answer (09/15/2023)  Housing: Patient Unable To Answer (09/15/2023)  Transportation Needs: Patient Unable To Answer (09/15/2023)  Recent Concern: Transportation Needs - Unmet Transportation Needs (06/17/2023)  Utilities: Patient Unable To Answer (09/15/2023)  Recent Concern: Utilities - At Risk (06/17/2023)  Depression (PHQ2-9): Low Risk  (04/24/2023)  Financial Resource Strain: Low Risk  (08/06/2023)   Received from [facility]  Social Connections: Unknown (04/29/2022)   Received from [facility]  Tobacco Use: Medium Risk (09/15/2023)    Readmission Risk Interventions    09/15/2023    8:54 AM 08/14/2023    2:18 PM 07/15/2023    8:52 AM  Readmission Risk Prevention Plan  Transportation Screening Complete Complete Complete  PCP or Specialist Appt within 3-5 Days Complete Complete Complete  HRI or Home Care Consult Complete Complete   Social Work Consult for Recovery Care Planning/Counseling Not Complete Complete Complete  Palliative Care Screening Not Applicable Not Applicable Not Applicable  Medication Review (RN Care Manager) Not Complete Complete Referral to Pharmacy  Med Review Comments Patient confused and disoriented.

## 2023-09-24 NOTE — Progress Notes (Signed)
Physical Therapy Treatment Patient Details Name: Joshua Berry MRN: [id number] DOB: [birth year] Today's Date: 09/24/2023   History of Present Illness Joshua Berry is a 77 y.o. male with medical history significant of NASH, liver cirrhosis, hepatic encephalopathy, esophageal varices, GI bleeding, anemia, thrombocytopenia, HTN, HLD, DM, dCHF, hypothyroidism, OSA not on CPAP, melanoma, s/p TAVR, recent admission due to subdural hematoma, MRSE bacteremia presenting with fall and AMS; admitted for management of hepatic encephalopathy.  Noted recent extended admission from August through September 2024 for issues including subdural hematoma, staphylococcal epidermidis bacteremia.    PT Comments  Patient received in recliner. He states he has not seen any PT for rehab. I opened his chart and reminded him he has walked with PT Monday and Tuesday. Patient is motivated to return to prior functional level. Wants to prioritize therapy. He requires min A for sit to stand and cga for ambulation with RW. Patient will continue to benefit from skilled PT to improve strength and functional independence.      If plan is discharge home, recommend the following: A little help with walking and/or transfers;A little help with bathing/dressing/bathroom;Assistance with cooking/housework;Assist for transportation;Help with stairs or ramp for entrance   Can travel by private vehicle     No  Equipment Recommendations  None recommended by PT    Recommendations for Other Services       Precautions / Restrictions Precautions Precautions: Fall Restrictions Weight Bearing Restrictions: No     Mobility  Bed Mobility               General bed mobility comments: Not tested; pt received in recliner and left in recliner at end of session    Transfers Overall transfer level: Needs assistance Equipment used: Rolling walker (2 wheels) Transfers: Sit to/from Stand Sit to Stand: Min assist           General  transfer comment: initial MIN A improves to CGA    Ambulation/Gait Ambulation/Gait assistance: Contact guard assist Gait Distance (Feet): 60 Feet Assistive device: Rolling walker (2 wheels) Gait Pattern/deviations: Step-through pattern, Decreased step length - right, Decreased step length - left, Trunk flexed Gait velocity: decreased     General Gait Details: Patient ambulated 3x to door and back to recliner with RW. Seated rest between reps   Stairs             Wheelchair Mobility     Tilt Bed    Modified Rankin (Stroke Patients Only)       Balance Overall balance assessment: Needs assistance Sitting-balance support: No upper extremity supported, Feet supported Sitting balance-Leahy Scale: Good     Standing balance support: Bilateral upper extremity supported, During functional activity, Reliant on assistive device for balance Standing balance-Leahy Scale: Good Standing balance comment: Able to ambulate with RW and CGA                            Cognition Arousal: Alert Behavior During Therapy: WFL for tasks assessed/performed Overall Cognitive Status: History of cognitive impairments - at baseline                                 General Comments: slow processing, poor STM        Exercises Other Exercises Other Exercises: Seated LAQ and marching 2x10 reps    General Comments        Pertinent Vitals/Pain  Pain Assessment Pain Assessment: No/denies pain    Home Living                          Prior Function            PT Goals (current goals can now be found in the care plan section) Acute Rehab PT Goals Patient Stated Goal: agreeable to participation with session, motivated to return to Prior functional level PT Goal Formulation: With patient Time For Goal Achievement: 09/30/23 Potential to Achieve Goals: Good Progress towards PT goals: Progressing toward goals    Frequency    Min 1X/week       PT Plan      Co-evaluation              AM-PAC PT "6 Clicks" Mobility   Outcome Measure  Help needed turning from your back to your side while in a flat bed without using bedrails?: A Lot Help needed moving from lying on your back to sitting on the side of a flat bed without using bedrails?: A Lot Help needed moving to and from a bed to a chair (including a wheelchair)?: A Little Help needed standing up from a chair using your arms (e.g., wheelchair or bedside chair)?: A Little Help needed to walk in hospital room?: A Little Help needed climbing 3-5 steps with a railing? : A Lot 6 Click Score: 15    End of Session Equipment Utilized During Treatment: Gait belt Activity Tolerance: Patient tolerated treatment well Patient left: in chair;with call bell/phone within reach Nurse Communication: Mobility status PT Visit Diagnosis: Muscle weakness (generalized) (M62.81);Difficulty in walking, not elsewhere classified (R26.2);Other symptoms and signs involving the nervous system (R29.898)     Time: 8295-6213 PT Time Calculation (min) (ACUTE ONLY): 33 min  Charges:    $Gait Training: 8-22 mins $Therapeutic Exercise: 8-22 mins PT General Charges $$ ACUTE PT VISIT: 1 Visit                     Sariah Henkin, PT, GCS 09/24/23,12:48 PM

## 2023-09-24 NOTE — Progress Notes (Signed)
Mobility Specialist - Progress Note   09/24/23 1016  Mobility  Activity Ambulated with assistance in room;Transferred from bed to chair  Level of Assistance Contact guard assist, steadying assist  Assistive Device Front wheel walker  Distance Ambulated (ft) 4 ft  Activity Response Tolerated well  $Mobility charge 1 Mobility  Mobility Specialist Start Time (ACUTE ONLY) 1004  Mobility Specialist Stop Time (ACUTE ONLY) 1014  Mobility Specialist Time Calculation (min) (ACUTE ONLY) 10 min   Pt supine upon entry, utilizing RA. Pt agreeable to transfer to the recliner this date. Pt completed bed mob indep, STS to RW ModA +1 and amb to the recliner CGA-- unsteady upon standing, able to gain center of balance after ~1 min of standing. Pt left seated in the recliner with needs within reach. RN notified.  Zetta Bills Mobility Specialist 09/24/23 10:22 AM

## 2023-09-24 NOTE — Progress Notes (Signed)
Patient is extremely rude and hostile towards staff.  RN and NT in room asking to give patient a bath.  Patient refuses even with education.  Patient also informed that the male external catheter needs to be removed and he should be using a urinal.  Patient refused.  States "no one is touching me".  Patient educated that we need a MD order for the external catheter.  Refused to allow staff to remove external catheter and give patient a bath.

## 2023-09-25 DIAGNOSIS — K7682 Hepatic encephalopathy: Secondary | ICD-10-CM | POA: Diagnosis not present

## 2023-09-25 DIAGNOSIS — R296 Repeated falls: Secondary | ICD-10-CM | POA: Diagnosis not present

## 2023-09-25 DIAGNOSIS — S0990XA Unspecified injury of head, initial encounter: Secondary | ICD-10-CM | POA: Diagnosis not present

## 2023-09-25 LAB — BASIC METABOLIC PANEL
Anion gap: 6 (ref 5–15)
BUN: 15 mg/dL (ref 8–23)
CO2: 25 mmol/L (ref 22–32)
Calcium: 8.5 mg/dL — ABNORMAL LOW (ref 8.9–10.3)
Chloride: 105 mmol/L (ref 98–111)
Creatinine, Ser: 0.6 mg/dL — ABNORMAL LOW (ref 0.61–1.24)
GFR, Estimated: >60 mL/min (ref >60)
Glucose, Bld: 146 mg/dL — ABNORMAL HIGH (ref 70–99)
Potassium: 3.7 mmol/L (ref 3.5–5.1)
Sodium: 136 mmol/L (ref 135–145)

## 2023-09-25 LAB — CBC WITH DIFFERENTIAL/PLATELET
Abs Immature Granulocytes: 0.01 10*3/uL (ref 0.00–0.07)
Basophils Absolute: 0 10*3/uL (ref 0.0–0.1)
Basophils Relative: 1 %
Eosinophils Absolute: 0.1 10*3/uL (ref 0.0–0.5)
Eosinophils Relative: 5 %
HCT: 26.7 % — ABNORMAL LOW (ref 39.0–52.0)
Hemoglobin: 9.5 g/dL — ABNORMAL LOW (ref 13.0–17.0)
Immature Granulocytes: 0 %
Lymphocytes Relative: 26 %
Lymphs Abs: 0.6 10*3/uL — ABNORMAL LOW (ref 0.7–4.0)
MCH: 32.4 pg (ref 26.0–34.0)
MCHC: 35.6 g/dL (ref 30.0–36.0)
MCV: 91.1 fL (ref 80.0–100.0)
Monocytes Absolute: 0.4 10*3/uL (ref 0.1–1.0)
Monocytes Relative: 15 %
Neutro Abs: 1.2 10*3/uL — ABNORMAL LOW (ref 1.7–7.7)
Neutrophils Relative %: 53 %
Platelets: 49 10*3/uL — ABNORMAL LOW (ref 150–400)
RBC: 2.93 MIL/uL — ABNORMAL LOW (ref 4.22–5.81)
RDW: 15.8 % — ABNORMAL HIGH (ref 11.5–15.5)
WBC: 2.4 10*3/uL — ABNORMAL LOW (ref 4.0–10.5)
nRBC: 0 % (ref 0.0–0.2)

## 2023-09-25 LAB — GLUCOSE, CAPILLARY
Glucose-Capillary: 166 mg/dL — ABNORMAL HIGH (ref 70–99)
Glucose-Capillary: 281 mg/dL — ABNORMAL HIGH (ref 70–99)

## 2023-09-25 LAB — MAGNESIUM: Magnesium: 1.8 mg/dL (ref 1.7–2.4)

## 2023-09-25 MED ORDER — LACTULOSE 10 GM/15ML PO SOLN: 30.0000 g | Freq: Three times a day (TID) | ORAL | Status: DC

## 2023-09-25 MED ORDER — PANTOPRAZOLE SODIUM 40 MG PO TBEC: 40.0000 mg | DELAYED_RELEASE_TABLET | Freq: Two times a day (BID) | ORAL | Status: DC

## 2023-09-25 MED ORDER — VITAMIN B-1 100 MG PO TABS: 100.0000 mg | ORAL_TABLET | Freq: Every day | ORAL | Status: DC

## 2023-09-25 NOTE — Progress Notes (Signed)
Physical Therapy Treatment Patient Details Name: Joshua Berry MRN: [id number] DOB: 04-Oct-1946 Today's Date: 09/25/2023   History of Present Illness Joshua Berry is a 77 y.o. male with medical history significant of NASH, liver cirrhosis, hepatic encephalopathy, esophageal varices, GI bleeding, anemia, thrombocytopenia, HTN, HLD, DM, dCHF, hypothyroidism, OSA not on CPAP, melanoma, s/p TAVR, recent admission due to subdural hematoma, MRSE bacteremia presenting with fall and AMS; admitted for management of hepatic encephalopathy.  Noted recent extended admission from August through September 2024 for issues including subdural hematoma, staphylococcal epidermidis bacteremia.    PT Comments  Patient is pleasant and ready for therapy. He requires min A to stand from recliner. Cues needed to scoot forward. He ambulated ~85 feet with RW to nurses desk and back to room. Patient then performed standing LE exercises at sink. He requires brief seated rest breaks during session due to L knee pain and fatigue. Patient is making good progress and will continue to benefit from skilled PT to improve strength, endurance and independence with mobility.     If plan is discharge home, recommend the following: A little help with walking and/or transfers;A little help with bathing/dressing/bathroom;Assistance with cooking/housework;Assist for transportation;Help with stairs or ramp for entrance   Can travel by private vehicle     No  Equipment Recommendations  None recommended by PT    Recommendations for Other Services       Precautions / Restrictions Precautions Precautions: Fall Restrictions Weight Bearing Restrictions: No     Mobility  Bed Mobility               General bed mobility comments: Not tested; pt received in recliner and left in recliner at end of session    Transfers Overall transfer level: Needs assistance Equipment used: Rolling walker (2 wheels) Transfers: Sit to/from  Stand Sit to Stand: Min assist           General transfer comment: initial MIN A improves to CGA    Ambulation/Gait Ambulation/Gait assistance: Contact guard assist Gait Distance (Feet): 85 Feet Assistive device: Rolling walker (2 wheels) Gait Pattern/deviations: Step-through pattern, Decreased step length - right, Decreased step length - left, Decreased stride length, Trunk flexed Gait velocity: decreased     General Gait Details: Ambulated to nurses desk and back to room, then ambulated to sink, RW, Cga. Decreased foot clearance Bilaterally   Stairs             Wheelchair Mobility     Tilt Bed    Modified Rankin (Stroke Patients Only)       Balance Overall balance assessment: Needs assistance Sitting-balance support: Feet supported Sitting balance-Leahy Scale: Good     Standing balance support: Bilateral upper extremity supported, During functional activity, Reliant on assistive device for balance Standing balance-Leahy Scale: Good Standing balance comment: Able to ambulate with RW and CGA                            Cognition Arousal: Alert Behavior During Therapy: WFL for tasks assessed/performed Overall Cognitive Status: History of cognitive impairments - at baseline                                 General Comments: slow processing, poor STM        Exercises Other Exercises Other Exercises: Standing exercises to include: heel raises, hip abd, marching x 10 reps  each ( standing at sink) Side stepping at sink 6'x4 reps.    General Comments        Pertinent Vitals/Pain Pain Assessment Pain Assessment: Faces Faces Pain Scale: Hurts a little bit Pain Location: L knee Pain Descriptors / Indicators: Discomfort, Grimacing Pain Intervention(s): Monitored during session, Repositioned    Home Living                          Prior Function            PT Goals (current goals can now be found in the care plan  section) Acute Rehab PT Goals Patient Stated Goal: motivated to return to Prior functional level PT Goal Formulation: With patient Time For Goal Achievement: 09/30/23 Potential to Achieve Goals: Good Progress towards PT goals: Progressing toward goals    Frequency    Min 1X/week      PT Plan      Co-evaluation              AM-PAC PT "6 Clicks" Mobility   Outcome Measure  Help needed turning from your back to your side while in a flat bed without using bedrails?: A Little Help needed moving from lying on your back to sitting on the side of a flat bed without using bedrails?: A Little Help needed moving to and from a bed to a chair (including a wheelchair)?: A Little Help needed standing up from a chair using your arms (e.g., wheelchair or bedside chair)?: A Little Help needed to walk in hospital room?: A Little Help needed climbing 3-5 steps with a railing? : A Lot 6 Click Score: 17    End of Session   Activity Tolerance: Patient tolerated treatment well;Patient limited by pain Patient left: in chair;with call bell/phone within reach Nurse Communication: Mobility status PT Visit Diagnosis: Muscle weakness (generalized) (M62.81);Difficulty in walking, not elsewhere classified (R26.2);Other symptoms and signs involving the nervous system (R29.898);Pain Pain - Right/Left: Left Pain - part of body: Knee     Time: 0865-7846 PT Time Calculation (min) (ACUTE ONLY): 24 min  Charges:    $Gait Training: 8-22 mins $Therapeutic Exercise: 8-22 mins PT General Charges $$ ACUTE PT VISIT: 1 Visit                     Amyah Clawson, PT, GCS 09/25/23,11:35 AM

## 2023-09-25 NOTE — TOC Progression Note (Addendum)
Transition of Care [facility]) - Progression Note    Patient Details  Name: Joshua Berry MRN: [id number] Date of Birth: 1946-11-18  [facility] CM/SW Contact  Margarito Liner, LCSW Phone Number: 09/25/2023, 11:52 AM  Clinical Narrative:   [facility] can accept patient today. Stepdaughter is aware. She asked about [facility] but [place] at the [facility] said that the [facility] would only agree to him going to [facility]. Patient has been updated.  Expected Discharge Plan and Services                                               Social Determinants of Health (SDOH) Interventions SDOH Screenings   Food Insecurity: Patient Unable To Answer (09/15/2023)  Housing: Patient Unable To Answer (09/15/2023)  Transportation Needs: Patient Unable To Answer (09/15/2023)  Recent Concern: Transportation Needs - Unmet Transportation Needs (06/17/2023)  Utilities: Patient Unable To Answer (09/15/2023)  Recent Concern: Utilities - At Risk (06/17/2023)  Depression (PHQ2-9): Low Risk  (04/24/2023)  Financial Resource Strain: Low Risk  (08/06/2023)   Received from [facility]  Social Connections: Unknown (04/29/2022)   Received from [facility]  Tobacco Use: Medium Risk (09/15/2023)    Readmission Risk Interventions    09/15/2023    8:54 AM 08/14/2023    2:18 PM 07/15/2023    8:52 AM  Readmission Risk Prevention Plan  Transportation Screening Complete Complete Complete  PCP or Specialist Appt within 3-5 Days Complete Complete Complete  HRI or Home Care Consult Complete Complete   Social Work Consult for Recovery Care Planning/Counseling Not Complete Complete Complete  Palliative Care Screening Not Applicable Not Applicable Not Applicable  Medication Review Oceanographer) Not Complete Complete Referral to Pharmacy  Med Review Comments Patient confused and disoriented.

## 2023-09-25 NOTE — TOC Transition Note (Signed)
Transition of Care [facility]) - CM/SW Discharge Note   Patient Details  Name: Joshua Berry MRN: [id number] Date of Birth: 1946-10-01  Transition of Care [facility]) CM/SW Contact:  Margarito Liner, LCSW Phone Number: 09/25/2023, 2:47 PM   Clinical Narrative:   Patient has orders to discharge to [facility] SNF today. RN has already called report. EMS transport has been arranged and he is 1st on the list. No further concerns. CSW signing off.  Final next level of care: Skilled Nursing Facility Barriers to Discharge: Barriers Resolved   Patient Goals and CMS Choice      Discharge Placement     Existing PASRR number confirmed : 09/19/23          Patient chooses bed at: [facility] Patient to be transferred to facility by: EMS Name of family member notified: Kristopher Glee Patient and family notified of of transfer: 09/25/23  Discharge Plan and Services Additional resources added to the After Visit Summary for                                       Social Determinants of Health (SDOH) Interventions SDOH Screenings   Food Insecurity: Patient Unable To Answer (09/15/2023)  Housing: Patient Unable To Answer (09/15/2023)  Transportation Needs: Patient Unable To Answer (09/15/2023)  Recent Concern: Transportation Needs - Unmet Transportation Needs (06/17/2023)  Utilities: Patient Unable To Answer (09/15/2023)  Recent Concern: Utilities - At Risk (06/17/2023)  Depression (PHQ2-9): Low Risk  (04/24/2023)  Financial Resource Strain: Low Risk  (08/06/2023)   Received from [facility]  Social Connections: Unknown (04/29/2022)   Received [facility]  Tobacco Use: Medium Risk (09/15/2023)     Readmission Risk Interventions    09/15/2023    8:54 AM 08/14/2023    2:18 PM 07/15/2023    8:52 AM  Readmission Risk Prevention Plan  Transportation Screening Complete Complete Complete  PCP or Specialist Appt within 3-5 Days Complete Complete  Complete  HRI or Home Care Consult Complete Complete   Social Work Consult for Recovery Care Planning/Counseling Not Complete Complete Complete  Palliative Care Screening Not Applicable Not Applicable Not Applicable  Medication Review Oceanographer) Not Complete Complete Referral to Pharmacy  Med Review Comments Patient confused and disoriented.

## 2023-09-25 NOTE — Plan of Care (Signed)
  Problem: Education: Goal: Knowledge of General Education information will improve Description: Including pain rating scale, medication(s)/side effects and non-pharmacologic comfort measures Outcome: Progressing   Problem: Clinical Measurements: Goal: Will remain free from infection Outcome: Progressing   

## 2023-09-25 NOTE — Plan of Care (Signed)

## 2023-09-25 NOTE — Progress Notes (Signed)
Report called to [facility], given to receiving nurse Verdon Cummins, RN.  AVS and discharge packet placed at nurses station by Charlynn Court, LCSW for EMS to transport.  Family updated per Estrella Myrtle.

## 2023-09-25 NOTE — Consult Note (Signed)
[facility] Institute [VBCI]  [facility] Inpatient Consult   09/25/2023  Joshua Berry 1946-03-27 [id number]  RN Hospital Liaison remote coverage review for patient admitted to [facility] coverage for [name], RN HL  [facility] [[facility]]  Accountable Care Organization [ACO] Patient: Medicare ACO REACH; [facility]  [facility] Provider:  [facility]   Patient was reviewed for less than 30 days readmission extreme high risk score 10 day length of stay for barriers to care review  Patient was screened for hospitalization and on behalf of [facility] /[facility] Care Coordination to assess for post hospital community care needs.  Patient is being considered for a skilled nursing facility level of care for post hospital transition.  If the patient goes to a [facility] affiliated facility then, patient can be followed by [facility] with traditional Medicare and approved Medicare Advantage plans.    Plan:   Patient currently noted for a non-[facility] facility.  His post hospital needs are to be met showing at [facility].  For questions or referrals, please contact:   Charlesetta Shanks, RN, BSN, CCM [name]  [facility] Liaison Direct Dial: [phone] or secure chat Website: [name]

## 2023-09-25 NOTE — Discharge Summary (Addendum)
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 male with medical history significant of NASH, liver cirrhosis, hepatic encephalopathy, esophageal varices, GI bleeding, anemia, thrombocytopenia, HTN, HLD, DM, dCHF, hypothyroidism, OSA not on CPAP, melanoma, s/p of TAVR, recent admission due to subdural hematoma, MRSE bacteremia presenting with fall and hepatic encephalopathy.  Limited history in the setting of encephalopathy.  Per report, patient with fall at local rehab facility.  Noted recent extended admission from August through September 2024 for issues including subdural hematoma, staphylococcal epidermidis bacteremia.  Had protracted course in the [facility] system for issues including middle meningeal artery embolization.  Per report, patient with noted fall at the rehab.  Daughter reports worsening confusion over the past 2 days prior to this hospitalization.  Upon arrival to the emergency room, White count 3.6, hemoglobin 10.6, platelets 58, urinalysis within normal limits, creatinine 0.76, glucose 162, T. bili 2. INR 1.4. Ammonia level 102. EKG normal sinus rhythm. CT head with ongoing bilateral mixed density subdural hematomas stable mild intracranial mass effect with right subdural being stable and left subdural being decreased. Left anterior vertex scalp hematoma.  Patient was  subsequently admitted for hepatic encephalopathy requiring lactulose and rifaximin administration.  His confusion improved to baseline.  He also had electrolytes derangement that was managed and corrected.  Patient has now been cleared for discharge to rehab facility today and to follow-up with primary care physician as well as his other outpatient physicians.   Consultants: None Procedures performed: None Disposition: Skilled nursing facility Diet recommendation:  Cardiac diet DISCHARGE MEDICATION: Allergies as of 09/25/2023       Reactions   Camphor Swelling, Rash   Latex Rash   Lisinopril Cough   Other Reaction(s): Cough   Sulfamethoxazole Swelling   Chocolate    Family states he is allergic   Nsaids Other (See Comments)   NASH        Medication List     STOP taking these medications    amphetamine-dextroamphetamine 5 MG tablet Commonly known as: ADDERALL   levETIRAcetam 250 MG tablet Commonly known as: KEPPRA   omeprazole 20 MG tablet Commonly known as: PRILOSEC OTC   sodium chloride 0.65 % Soln nasal spray Commonly known as: OCEAN       TAKE these medications    acetaminophen 500 MG tablet Commonly known as: TYLENOL Take 1 tablet (500 mg total) by mouth every 6 (six) hours as needed for moderate pain, fever, headache or mild pain (headache).   atorvastatin 20 MG tablet Commonly known as: LIPITOR Take 20 mg by mouth at bedtime.   butalbital-acetaminophen-caffeine 50-325-40 MG tablet Commonly known as: FIORICET Take 1 tablet by mouth every 8 (eight) hours as needed for headache.   Cholecalciferol 50 MCG (2000 UT) Tabs Take 1 tablet by mouth daily.   clotrimazole 1 % external solution Commonly known as: LOTRIMIN Apply 1 Application topically 2 (two) times daily. Apply to toe nails   famotidine  20 MG tablet Commonly known as: PEPCID Take 20 mg by mouth daily.   feeding supplement Liqd Take 237 mLs by mouth 3 (three) times daily between meals.    Fish Oil 1000 MG Caps Take 1 capsule by mouth 2 (two) times daily.   furosemide 20 MG tablet Commonly known as: LASIX Take 1 tablet (20 mg total) by mouth daily.   insulin aspart 100 UNIT/ML injection Commonly known as: novoLOG Inject 5 Units into the skin 3 (three) times daily before meals.   insulin glargine 100 UNIT/ML injection Commonly known as: LANTUS Inject 0.1 mLs (10 Units total) into the skin at bedtime.   ketotifen 0.025 % ophthalmic solution Commonly known as: ZADITOR Place 1 drop into both eyes 2 (two) times daily.   lactulose 10 GM/15ML solution Commonly known as: CHRONULAC Take 45 mLs (30 g total) by mouth 3 (three) times daily. What changed: how much to take   levothyroxine 100 MCG tablet Commonly known as: SYNTHROID Take 100 mcg by mouth daily before breakfast.   lidocaine 5 % Commonly known as: LIDODERM Place 2 patches onto the skin daily. Remove & Discard patch within 12 hours or as directed by MD   magnesium oxide 400 (240 Mg) MG tablet Commonly known as: MAG-OX Take 1 tablet (400 mg total) by mouth daily.   melatonin 3 MG Tabs tablet Take 6 mg by mouth at bedtime.   multivitamin with minerals Tabs tablet Take 1 tablet by mouth daily.   pantoprazole 40 MG tablet Commonly known as: Protonix Take 1 tablet (40 mg total) by mouth 2 (two) times daily.   polyethylene glycol 17 g packet Commonly known as: MIRALAX / GLYCOLAX Take 17 g by mouth daily as needed for mild constipation.   RA Probiotic Digestive Care Caps Take 1 capsule by mouth daily.   rifaximin 550 MG Tabs tablet Commonly known as: XIFAXAN Take 550 mg by mouth 2 (two) times daily. Takes with lactulose   terazosin 5 MG capsule Commonly known as: HYTRIN Take 5 mg by mouth at bedtime.   thiamine 100 MG tablet Commonly known as: Vitamin B-1 Take 1 tablet (100 mg total) by mouth daily. Start taking on: September 26, 2023   trolamine salicylate 10 % cream Commonly known as:  ASPERCREME Apply topically as needed for muscle pain.   zinc oxide 20 % ointment Apply 1 Application topically in the morning and at bedtime. APPLY SMALL AMOUNT TO AFFECTED AREA TWICE A DAY FOR BUTTOCK RASH FOR BUTTOCK RASH        Discharge Exam: Filed Weights   09/15/23 0347  Weight: 98.8 kg   General -[place] male laying in bed in no acute distress HEENT - PERRLA, EOMI, scalp surgical scar, non tender sinuses. Lung - Clear, diffuse rales, rhonchi, no wheezes. Heart - S1, S2 heard, no murmurs, rubs, trace pedal edema. Abdomen - Soft, non tender, bowel sounds good Neuro - Alert, awake and oriented, non focal exam. Skin - Warm and dry.  Condition at discharge: good  The results of significant diagnostics from this hospitalization (including imaging, microbiology, ancillary and laboratory) are listed below for reference.   Imaging Studies: CT HEAD WO CONTRAST ( )  Addendum Date: 09/16/2023   ADDENDUM REPORT: 09/16/2023 10:39 ADDENDUM: Impression #2 should state: Increased size of left frontal scalp hematoma. Electronically Signed   By: Lorenza Cambridge M.D.   On: 09/16/2023 10:39   Result Date: 09/16/2023 CLINICAL DATA:  Subdural hematoma follow-up EXAM: CT HEAD WITHOUT CONTRAST TECHNIQUE: Contiguous  axial images were obtained from the base of the skull through the vertex without intravenous contrast. RADIATION DOSE REDUCTION: This exam was performed according to the departmental dose-optimization program which includes automated exposure control, adjustment of the mA and/or kV according to patient size and/or use of iterative reconstruction technique. COMPARISON:  09/15/2023 at 4 a.m. FINDINGS: Brain: Unchanged appearance of right convexity mixed density subdural hematoma, predominantly hypodense. 4 mm leftward midline shift is unchanged. Smaller left convexity chronic subdural hematoma is also unchanged. Vascular: Negative Skull: Increased left frontal scalp hematoma. No skull  fracture. Bilateral anterior burr holes. Sinuses/Orbits: No acute finding. Other: None. IMPRESSION: 1. Unchanged appearance of bilateral convexity subdural hematomas, right greater than left, with 4 mm of leftward midline shift. 2. Increased left frontal scalp subdural hematoma. Electronically Signed: By: Deatra Robinson M.D. On: 09/16/2023 03:13   DG Knee 1-2 Views Left  Result Date: 09/15/2023 CLINICAL DATA:  Left knee pain following fall, initial encounter EXAM: LEFT KNEE - 2 VIEW COMPARISON:  None Available. FINDINGS: Tricompartmental degenerative changes are noted. Small joint effusion is seen. No acute fracture or dislocation is noted. IMPRESSION: Degenerative change without acute abnormality. Electronically Signed   By: Alcide Clever M.D.   On: 09/15/2023 21:26   CT Cervical Spine Wo Contrast  Result Date: 09/15/2023 CLINICAL DATA:  77 year old male status post unwitnessed fall. Forehead wound. History of bilateral mixed density subdural hematomas. EXAM: CT CERVICAL SPINE WITHOUT CONTRAST TECHNIQUE: Multidetector CT imaging of the cervical spine was performed without intravenous contrast. Multiplanar CT image reconstructions were also generated. RADIATION DOSE REDUCTION: This exam was performed according to the departmental dose-optimization program which includes automated exposure control, adjustment of the mA and/or kV according to patient size and/or use of iterative reconstruction technique. COMPARISON:  Head CT today.  Cervical spine CT 07/14/2023. FINDINGS: Alignment: Stable straightening of lordosis. Cervicothoracic junction alignment is within normal limits. Bilateral posterior element alignment is within normal limits. Skull base and vertebrae: Some chronic osteopenia. Visualized skull base is intact. No atlanto-occipital dissociation. C1 and C2 appear severely degenerated, but stable and aligned. Additional degenerative changes described below. No acute osseous abnormality identified. Soft  tissues and spinal canal: No prevertebral fluid or swelling. No visible canal hematoma. Calcified cervical carotid atherosclerosis. Otherwise negative visible noncontrast neck soft tissues. Disc levels: Cervical spine degeneration superimposed on multilevel chronic degenerative appearing ankylosis including C2-C3, C4 through C6. Bulky disc osteophyte degeneration at C6-C7. Chronic facet arthropathy at C3-C4. Evidence of chronic degenerative spinal stenosis at both C5-C6 and C6-C7, mild to moderate and stable. Upper chest: Visible upper thoracic levels appear intact. Stable visible lungs with subpleural reticular opacity, more so the dependent. IMPRESSION: 1. No acute traumatic injury identified in the cervical spine. 2. 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 Electronically Signed   By: Odessa Fleming M.D.   On: 09/15/2023 04:24   CT HEAD WO CONTRAST ( )  Result Date: 09/15/2023 CLINICAL DATA:  76 year old male status post unwitnessed fall. Forehead wound. History of bilateral mixed density subdural hematomas. EXAM: CT HEAD WITHOUT CONTRAST TECHNIQUE: Contiguous axial images were obtained from the base of the skull through the vertex without intravenous contrast. RADIATION DOSE REDUCTION: This exam was performed according to the departmental dose-optimization program which includes automated exposure control, adjustment of the mA and/or kV according to patient size and/or use of iterative reconstruction technique. COMPARISON:  Head CT 09/09/2023 and earlier. FINDINGS: Brain: Ongoing bilateral mixed density subdural  hematoma. Hematoma on the left has mildly decreased since 09/09/2023, now 4-5 mm in thickness (previously 6-7 mm at the same level). No increase in hyperdense blood on that side. Contralateral right subdural appears stable with similar scattered hyperdense blood products, 6-7 mm thickness at most levels. These  hematomas were 8-9 mm by MRI in August. Basilar cisterns remain normal. No ventriculomegaly. Leftward midline shift of 4 mm, stable to slightly improved since August. No new areas of intracranial hemorrhage identified. Stable gray-white matter differentiation throughout the brain. No cortically based acute infarct identified. Vascular: Calcified atherosclerosis at the skull base. No suspicious intracranial vascular hyperdensity. Skull: Chronic bilateral vertex burr holes. No acute fracture or new osseous abnormality identified. Sinuses/Orbits: Visualized paranasal sinuses and mastoids are clear. Other: Broad-based left anterior vertex scalp hematoma, up to 8 mm in thickness. Underlying calvarium appears stable, intact. Visualized orbit soft tissues are within normal limits. IMPRESSION: 1. Ongoing bilateral mixed density subdural hematomas. Stable mild intracranial mass effect. Right subdural is stable, 6-7 mm in thickness. Left subdural decreased from 6-7 mm to now 4-5 mm in thickness. 2. Left anterior vertex scalp hematoma without underlying skull fracture. 3. No new intracranial abnormality. Electronically Signed   By: Odessa Fleming M.D.   On: 09/15/2023 04:20   CT HEAD WO CONTRAST ( )  Result Date: 09/09/2023 CLINICAL DATA:  Subdural hematoma. EXAM: CT HEAD WITHOUT CONTRAST TECHNIQUE: Contiguous axial images were obtained from the base of the skull through the vertex without intravenous contrast. RADIATION DOSE REDUCTION: This exam was performed according to the departmental dose-optimization program which includes automated exposure control, adjustment of the mA and/or kV according to patient size and/or use of iterative reconstruction technique. COMPARISON:  CT head 08/19/2023. FINDINGS: Brain: Decreased size of a mixed density subdural hematoma on the right, now measuring 1 cm in greatest thickness. Decreased size of a left cerebral convexity mixed density subdural hematoma, now 0.8 cm. Mild decrease in  leftward midline shift, now 4 mm. No evidence of acute large vascular territory infarct, mass lesion, or hydrocephalus. Vascular: No hyperdense vessel. Skull: No acute fracture.  Prior burr holes bilaterally. Sinuses/Orbits: Clear sinuses.  No acute orbital findings. Other: No mastoid effusions. IMPRESSION: 1. Mild interval decrease in size of bilateral mixed density subdural hematomas, detailed above. 2. Mild decrease in leftward midline shift, now 4 mm. Electronically Signed   By: Feliberto Harts M.D.   On: 09/09/2023 15:40    Microbiology: Results for orders placed or performed during the hospital encounter of 09/15/23  MRSA Next Gen by PCR, Nasal     Status: None   Collection Time: 09/16/23  6:45 AM   Specimen: Nasal Mucosa; Nasal Swab  Result Value Ref Range Status   MRSA by PCR Next Gen NOT DETECTED NOT DETECTED Final    Comment: (NOTE) The GeneXpert MRSA Assay (FDA approved for NASAL specimens only), is one component of a comprehensive MRSA colonization surveillance program. It is not intended to diagnose MRSA infection nor to guide or monitor treatment for MRSA infections. Test performance is not FDA approved in patients less than 106 years old. Performed at [facility], [address] [place], [place] [address]     Labs: CBC: Recent Labs  Lab 09/20/23 0456 09/23/23 0528 09/25/23 0421  WBC 2.4* 2.5* 2.4*  NEUTROABS  --   --  1.2*  HGB 9.6* 9.8* 9.5*  HCT 27.0* 28.1* 26.7*  MCV 92.5 93.0 91.1  PLT 47* 44* 49*   Basic Metabolic Panel: Recent  Labs  Lab 09/23/23 0528 09/24/23 1035 09/25/23 0421  NA 136  --  136  K 3.6  --  3.7  CL 107  --  105  CO2 24  --  25  GLUCOSE 145*  --  146*  BUN 14  --  15  CREATININE 0.61  --  0.60*  CALCIUM 8.7*  --  8.5*  MG 1.5* 1.6* 1.8   Liver Function Tests: No results for input(s): "AST", "ALT", "ALKPHOS", "BILITOT", "PROT", "ALBUMIN" in the last 168 hours. CBG: Recent Labs  Lab 09/24/23 1151 09/24/23 1619  09/24/23 2119 09/25/23 0803 09/25/23 1149  GLUCAP 250* 259* 210* 166* 281*    Discharge time spent:  35 minutes.  Signed: Loyce Dys, MD [facility] 09/25/2023   Addendum 5:40 PM  Patient's oral magnesium dose has been confirmed by family to be 800 mg twice daily.  According to the family, patient's PCP wants him to be on 800 mg twice daily and they agreed to follow-up his magnesium levels.Marland Kitchen

## 2023-09-26 NOTE — Consult Note (Signed)
[facility] Customer service manager [facility]) Accountable Care Organization (ACO) [facility] Liaison Note  09/26/2023  ECHO ALLSBROOK 02-10-1946 [id number]  Location: [facility] RN Hospital Liaison screened the patient remotely at [facility].  Insurance:  [place] and MCR   Joshua Berry is a 77 y.o. male who is a Primary Care Patient of [facility]. The patient was screened for 30 day readmission hospitalization with noted extreme risk score for unplanned readmission risk with 5 IP in 6 months.  The patient was assessed for potential [facility]) Care Management service needs for post hospital transition for care coordination. Review of patient's electronic medical record reveals patient was admitted Hepatic encephalopathy. Pt discharged to SNF level of care. Facility will continue to address pt's ongoing needs.     [facility] Care Management/Population Health does not replace or interfere with any arrangements made by the Inpatient Transition of Care team.   For questions contact:   [name], RN, [facility] Liaison [facility]   Population Health Office Hours MTWF  8:00 am-6:00 pm [phone] mobile [phone] [Office toll free line] Office Hours are M-F 8:30 - 5 pm [email or web address]

## 2023-10-02 ENCOUNTER — Ambulatory Visit: Payer: Medicare Other | Admitting: Neurosurgery

## 2023-10-02 ENCOUNTER — Encounter: Payer: Self-pay | Admitting: Neurosurgery

## 2023-10-02 VITALS — BP 108/62 | Ht 74.0 in | Wt 217.0 lb

## 2023-10-02 DIAGNOSIS — S065XAA Traumatic subdural hemorrhage with loss of consciousness status unknown, initial encounter: Secondary | ICD-10-CM

## 2023-10-02 DIAGNOSIS — Z09 Encounter for follow-up examination after completed treatment for conditions other than malignant neoplasm: Secondary | ICD-10-CM

## 2023-10-02 DIAGNOSIS — S065XAD Traumatic subdural hemorrhage with loss of consciousness status unknown, subsequent encounter: Secondary | ICD-10-CM

## 2023-10-02 NOTE — Progress Notes (Signed)
   DOS: 07/14/2023 (B Burr holes) 08/04/2023 (MMA embo at [facility])  HISTORY OF PRESENT ILLNESS: 10/02/2023 [name] is status post burr hole evacuation of subdural hematomas.  He has been improving slowly over time.  His physical capabilities are limited compared to prior to this event, but improved compared to when he was in the hospital.  He has been showing some signs of behavioral changes and memory issues.  PHYSICAL EXAMINATION:   Vitals:   10/02/23 0940  BP: 108/62   General: Patient is well developed, well nourished, calm, collected, and in no apparent distress.  NEUROLOGICAL:  General: In no acute distress.  Awake, alert, oriented to person and year.  He cannot recall the place or the month.   Pupils equal round and reactive to light.   Strength: Side Biceps Triceps Deltoid Interossei Grip Wrist Ext. Wrist Flex.  R 5 5 5 5 5 5 5   L 5 5 5 5 5 5 5    Incision c/d/i   ROS (Neurologic): Negative except as noted above  IMAGING: CT scans of the past 3 months were reviewed.  His last CT scan from 3 weeks ago shows improvement in his subdural fluid collections.  ASSESSMENT/PLAN:  [name] is doing well after bur hole drainage as well as MMA embolization for bilateral subdural hematomas.  He has had an extended recovery, which is not unexpected given his multiple medical issues.  I am hopeful that he will continue to show slow improvement.  We will defer any further imaging unless he has a worsened neurologic condition or has falls.  He continues to be at heightened risk of significant intracranial bleeding if he has a fall.  I will see him back on an as-needed basis.  For his cognitive decline, he may be showing signs of dementia.  I have recommended and offered neurology referral, but he would like to wait and talk to his primary care provider.  I spent a total of 15 minutes in this patient's care today. This time was spent reviewing pertinent records including imaging  studies, obtaining and confirming history, performing a directed evaluation, formulating and discussing my recommendations, and documenting the visit within the medical record.    Venetia Night MD, [facility] Department of Neurosurgery

## 2023-10-16 ENCOUNTER — Emergency Department: Payer: Medicare Other

## 2023-10-16 ENCOUNTER — Inpatient Hospital Stay
Admission: EM | Admit: 2023-10-16 | Discharge: 2023-11-24 | DRG: 065 | Disposition: A | Discharge status: Skilled Nursing Facility | Payer: Medicare Other | Source: Skilled Nursing Facility | Attending: Obstetrics and Gynecology | Admitting: Obstetrics and Gynecology

## 2023-10-16 ENCOUNTER — Inpatient Hospital Stay: Payer: Medicare Other

## 2023-10-16 DIAGNOSIS — Z515 Encounter for palliative care: Secondary | ICD-10-CM | POA: Diagnosis not present

## 2023-10-16 DIAGNOSIS — E782 Mixed hyperlipidemia: Secondary | ICD-10-CM | POA: Diagnosis not present

## 2023-10-16 DIAGNOSIS — Z7189 Other specified counseling: Secondary | ICD-10-CM | POA: Diagnosis not present

## 2023-10-16 DIAGNOSIS — Z952 Presence of prosthetic heart valve: Secondary | ICD-10-CM | POA: Diagnosis not present

## 2023-10-16 DIAGNOSIS — R54 Age-related physical debility: Secondary | ICD-10-CM | POA: Diagnosis present

## 2023-10-16 DIAGNOSIS — Z794 Long term (current) use of insulin: Secondary | ICD-10-CM

## 2023-10-16 DIAGNOSIS — Z6827 Body mass index (BMI) 27.0-27.9, adult: Secondary | ICD-10-CM

## 2023-10-16 DIAGNOSIS — D61818 Other pancytopenia: Secondary | ICD-10-CM | POA: Diagnosis present

## 2023-10-16 DIAGNOSIS — Z751 Person awaiting admission to adequate facility elsewhere: Secondary | ICD-10-CM

## 2023-10-16 DIAGNOSIS — K7210 Chronic hepatic failure without coma: Secondary | ICD-10-CM | POA: Diagnosis present

## 2023-10-16 DIAGNOSIS — Z91040 Latex allergy status: Secondary | ICD-10-CM

## 2023-10-16 DIAGNOSIS — R4182 Altered mental status, unspecified: Secondary | ICD-10-CM | POA: Diagnosis present

## 2023-10-16 DIAGNOSIS — Z85820 Personal history of malignant melanoma of skin: Secondary | ICD-10-CM

## 2023-10-16 DIAGNOSIS — G4730 Sleep apnea, unspecified: Secondary | ICD-10-CM | POA: Diagnosis present

## 2023-10-16 DIAGNOSIS — R29702 NIHSS score 2: Secondary | ICD-10-CM | POA: Diagnosis present

## 2023-10-16 DIAGNOSIS — I10 Essential (primary) hypertension: Secondary | ICD-10-CM | POA: Diagnosis present

## 2023-10-16 DIAGNOSIS — E1165 Type 2 diabetes mellitus with hyperglycemia: Secondary | ICD-10-CM | POA: Diagnosis present

## 2023-10-16 DIAGNOSIS — Z882 Allergy status to sulfonamides status: Secondary | ICD-10-CM

## 2023-10-16 DIAGNOSIS — K7581 Nonalcoholic steatohepatitis (NASH): Secondary | ICD-10-CM | POA: Diagnosis present

## 2023-10-16 DIAGNOSIS — R5381 Other malaise: Secondary | ICD-10-CM | POA: Diagnosis not present

## 2023-10-16 DIAGNOSIS — S065XAA Traumatic subdural hemorrhage with loss of consciousness status unknown, initial encounter: Secondary | ICD-10-CM | POA: Diagnosis present

## 2023-10-16 DIAGNOSIS — K7460 Unspecified cirrhosis of liver: Secondary | ICD-10-CM | POA: Diagnosis present

## 2023-10-16 DIAGNOSIS — Z66 Do not resuscitate: Secondary | ICD-10-CM | POA: Diagnosis present

## 2023-10-16 DIAGNOSIS — Z79899 Other long term (current) drug therapy: Secondary | ICD-10-CM

## 2023-10-16 DIAGNOSIS — Z87891 Personal history of nicotine dependence: Secondary | ICD-10-CM

## 2023-10-16 DIAGNOSIS — K219 Gastro-esophageal reflux disease without esophagitis: Secondary | ICD-10-CM | POA: Diagnosis present

## 2023-10-16 DIAGNOSIS — K5900 Constipation, unspecified: Secondary | ICD-10-CM | POA: Diagnosis not present

## 2023-10-16 DIAGNOSIS — Z9049 Acquired absence of other specified parts of digestive tract: Secondary | ICD-10-CM

## 2023-10-16 DIAGNOSIS — I629 Nontraumatic intracranial hemorrhage, unspecified: Secondary | ICD-10-CM | POA: Diagnosis not present

## 2023-10-16 DIAGNOSIS — E039 Hypothyroidism, unspecified: Secondary | ICD-10-CM | POA: Diagnosis present

## 2023-10-16 DIAGNOSIS — E663 Overweight: Secondary | ICD-10-CM | POA: Diagnosis present

## 2023-10-16 DIAGNOSIS — Z888 Allergy status to other drugs, medicaments and biological substances status: Secondary | ICD-10-CM

## 2023-10-16 DIAGNOSIS — Z886 Allergy status to analgesic agent status: Secondary | ICD-10-CM

## 2023-10-16 DIAGNOSIS — I6200 Nontraumatic subdural hemorrhage, unspecified: Principal | ICD-10-CM | POA: Diagnosis present

## 2023-10-16 DIAGNOSIS — Z79890 Hormone replacement therapy (postmenopausal): Secondary | ICD-10-CM

## 2023-10-16 DIAGNOSIS — E119 Type 2 diabetes mellitus without complications: Secondary | ICD-10-CM

## 2023-10-16 DIAGNOSIS — I8510 Secondary esophageal varices without bleeding: Secondary | ICD-10-CM | POA: Diagnosis present

## 2023-10-16 DIAGNOSIS — R64 Cachexia: Secondary | ICD-10-CM | POA: Diagnosis present

## 2023-10-16 DIAGNOSIS — K7682 Hepatic encephalopathy: Principal | ICD-10-CM | POA: Diagnosis present

## 2023-10-16 DIAGNOSIS — Z91018 Allergy to other foods: Secondary | ICD-10-CM

## 2023-10-16 DIAGNOSIS — E785 Hyperlipidemia, unspecified: Secondary | ICD-10-CM | POA: Diagnosis present

## 2023-10-16 DIAGNOSIS — Z8249 Family history of ischemic heart disease and other diseases of the circulatory system: Secondary | ICD-10-CM

## 2023-10-16 LAB — HEPATIC FUNCTION PANEL
ALT: 29 U/L (ref 0–44)
AST: 47 U/L — ABNORMAL HIGH (ref 15–41)
Albumin: 3 g/dL — ABNORMAL LOW (ref 3.5–5.0)
Alkaline Phosphatase: 133 U/L — ABNORMAL HIGH (ref 38–126)
Bilirubin, Direct: 0.4 mg/dL — ABNORMAL HIGH (ref 0.0–0.2)
Indirect Bilirubin: 1.4 mg/dL — ABNORMAL HIGH (ref 0.3–0.9)
Total Bilirubin: 1.8 mg/dL — ABNORMAL HIGH (ref 0.3–1.2)
Total Protein: 6.9 g/dL (ref 6.5–8.1)

## 2023-10-16 LAB — CBC
HCT: 33.1 % — ABNORMAL LOW (ref 39.0–52.0)
Hemoglobin: 11.5 g/dL — ABNORMAL LOW (ref 13.0–17.0)
MCH: 31.6 pg (ref 26.0–34.0)
MCHC: 34.7 g/dL (ref 30.0–36.0)
MCV: 90.9 fL (ref 80.0–100.0)
Platelets: 59 10*3/uL — ABNORMAL LOW (ref 150–400)
RBC: 3.64 MIL/uL — ABNORMAL LOW (ref 4.22–5.81)
RDW: 15.5 % (ref 11.5–15.5)
WBC: 2.9 10*3/uL — ABNORMAL LOW (ref 4.0–10.5)
nRBC: 0 % (ref 0.0–0.2)

## 2023-10-16 LAB — BASIC METABOLIC PANEL
Anion gap: 9 (ref 5–15)
BUN: 10 mg/dL (ref 8–23)
CO2: 22 mmol/L (ref 22–32)
Calcium: 8.8 mg/dL — ABNORMAL LOW (ref 8.9–10.3)
Chloride: 106 mmol/L (ref 98–111)
Creatinine, Ser: 0.78 mg/dL (ref 0.61–1.24)
GFR, Estimated: >60 mL/min (ref >60)
Glucose, Bld: 157 mg/dL — ABNORMAL HIGH (ref 70–99)
Potassium: 3.5 mmol/L (ref 3.5–5.1)
Sodium: 137 mmol/L (ref 135–145)

## 2023-10-16 LAB — URINALYSIS, ROUTINE W REFLEX MICROSCOPIC
Bilirubin Urine: NEGATIVE
Glucose, UA: NEGATIVE mg/dL
Hgb urine dipstick: NEGATIVE
Ketones, ur: NEGATIVE mg/dL
Leukocytes,Ua: NEGATIVE
Nitrite: NEGATIVE
Protein, ur: NEGATIVE mg/dL
Specific Gravity, Urine: 1.018 (ref 1.005–1.030)
pH: 7 (ref 5.0–8.0)

## 2023-10-16 LAB — CBG MONITORING, ED
Glucose-Capillary: 168 mg/dL — ABNORMAL HIGH (ref 70–99)
Glucose-Capillary: 183 mg/dL — ABNORMAL HIGH (ref 70–99)

## 2023-10-16 LAB — PHOSPHORUS: Phosphorus: 3.9 mg/dL (ref 2.5–4.6)

## 2023-10-16 LAB — AMMONIA: Ammonia: 89 umol/L — ABNORMAL HIGH (ref 9–35)

## 2023-10-16 LAB — MAGNESIUM: Magnesium: 1.5 mg/dL — ABNORMAL LOW (ref 1.7–2.4)

## 2023-10-16 MED ORDER — PANTOPRAZOLE SODIUM 40 MG PO TBEC
40.0000 mg | DELAYED_RELEASE_TABLET | Freq: Two times a day (BID) | ORAL | Status: DC
  Administered 2023-10-16 – 2023-11-10 (×50): 40 mg via ORAL
  Filled 2023-10-16 (×51): qty 1

## 2023-10-16 MED ORDER — SENNOSIDES-DOCUSATE SODIUM 8.6-50 MG PO TABS: 1.0000 | ORAL_TABLET | Freq: Every evening | ORAL | Status: DC | PRN

## 2023-10-16 MED ORDER — LACTULOSE 10 GM/15ML PO SOLN
30.0000 g | Freq: Three times a day (TID) | ORAL | Status: DC
Start: 2023-10-16 — End: 2023-10-17
  Administered 2023-10-16 – 2023-10-17 (×2): 30 g via ORAL
  Filled 2023-10-16 (×2): qty 60

## 2023-10-16 MED ORDER — INSULIN GLARGINE-YFGN 100 UNIT/ML ~~LOC~~ SOLN
10.0000 [IU] | Freq: Every day | SUBCUTANEOUS | Status: DC
  Administered 2023-10-16 – 2023-11-09 (×25): 10 [IU] via SUBCUTANEOUS
  Filled 2023-10-16 (×27): qty 0.1

## 2023-10-16 MED ORDER — MELATONIN 5 MG PO TABS
5.0000 mg | ORAL_TABLET | Freq: Every day | ORAL | Status: DC
  Administered 2023-10-16 – 2023-11-23 (×38): 5 mg via ORAL
  Filled 2023-10-16 (×39): qty 1

## 2023-10-16 MED ORDER — ONDANSETRON HCL 4 MG PO TABS: 4.0000 mg | ORAL_TABLET | Freq: Four times a day (QID) | ORAL | Status: AC | PRN

## 2023-10-16 MED ORDER — MAGNESIUM SULFATE 2 GM/50ML IV SOLN
2.0000 g | Freq: Once | INTRAVENOUS | Status: AC
  Administered 2023-10-16: 2 g via INTRAVENOUS
  Filled 2023-10-16: qty 50

## 2023-10-16 MED ORDER — INSULIN ASPART 100 UNIT/ML IJ SOLN
0.0000 [IU] | Freq: Three times a day (TID) | INTRAMUSCULAR | Status: DC
  Administered 2023-10-16: 2 [IU] via SUBCUTANEOUS
  Administered 2023-10-17: 3 [IU] via SUBCUTANEOUS
  Administered 2023-10-17: 2 [IU] via SUBCUTANEOUS
  Administered 2023-10-17: 3 [IU] via SUBCUTANEOUS
  Administered 2023-10-18: 2 [IU] via SUBCUTANEOUS
  Administered 2023-10-18: 1 [IU] via SUBCUTANEOUS
  Administered 2023-10-18: 3 [IU] via SUBCUTANEOUS
  Administered 2023-10-19: 5 [IU] via SUBCUTANEOUS
  Administered 2023-10-19: 1 [IU] via SUBCUTANEOUS
  Administered 2023-10-19 – 2023-10-20 (×3): 3 [IU] via SUBCUTANEOUS
  Administered 2023-10-20 – 2023-10-21 (×2): 1 [IU] via SUBCUTANEOUS
  Administered 2023-10-21: 3 [IU] via SUBCUTANEOUS
  Administered 2023-10-21: 5 [IU] via SUBCUTANEOUS
  Administered 2023-10-22: 1 [IU] via SUBCUTANEOUS
  Administered 2023-10-22 – 2023-10-23 (×2): 3 [IU] via SUBCUTANEOUS
  Administered 2023-10-23: 2 [IU] via SUBCUTANEOUS
  Administered 2023-10-23 – 2023-10-24 (×3): 3 [IU] via SUBCUTANEOUS
  Administered 2023-10-24: 1 [IU] via SUBCUTANEOUS
  Administered 2023-10-25: 5 [IU] via SUBCUTANEOUS
  Administered 2023-10-25: 1 [IU] via SUBCUTANEOUS
  Administered 2023-10-25: 2 [IU] via SUBCUTANEOUS
  Administered 2023-10-26: 1 [IU] via SUBCUTANEOUS
  Administered 2023-10-26: 3 [IU] via SUBCUTANEOUS
  Administered 2023-10-27: 7 [IU] via SUBCUTANEOUS
  Administered 2023-10-27: 1 [IU] via SUBCUTANEOUS
  Administered 2023-10-27: 2 [IU] via SUBCUTANEOUS
  Administered 2023-10-28: 5 [IU] via SUBCUTANEOUS
  Administered 2023-10-28: 2 [IU] via SUBCUTANEOUS
  Administered 2023-10-28: 3 [IU] via SUBCUTANEOUS
  Administered 2023-10-29: 2 [IU] via SUBCUTANEOUS
  Administered 2023-10-29: 3 [IU] via SUBCUTANEOUS
  Administered 2023-10-29 – 2023-10-30 (×2): 1 [IU] via SUBCUTANEOUS
  Administered 2023-10-30 – 2023-10-31 (×4): 2 [IU] via SUBCUTANEOUS
  Administered 2023-10-31 – 2023-11-01 (×2): 1 [IU] via SUBCUTANEOUS
  Administered 2023-11-01: 3 [IU] via SUBCUTANEOUS
  Administered 2023-11-01: 1 [IU] via SUBCUTANEOUS
  Administered 2023-11-02: 3 [IU] via SUBCUTANEOUS
  Administered 2023-11-02: 1 [IU] via SUBCUTANEOUS
  Administered 2023-11-03 (×2): 2 [IU] via SUBCUTANEOUS
  Administered 2023-11-03: 3 [IU] via SUBCUTANEOUS
  Administered 2023-11-04: 2 [IU] via SUBCUTANEOUS
  Administered 2023-11-04: 3 [IU] via SUBCUTANEOUS
  Administered 2023-11-04 – 2023-11-05 (×2): 1 [IU] via SUBCUTANEOUS
  Administered 2023-11-05 – 2023-11-06 (×2): 2 [IU] via SUBCUTANEOUS
  Administered 2023-11-06: 3 [IU] via SUBCUTANEOUS
  Administered 2023-11-07: 1 [IU] via SUBCUTANEOUS
  Administered 2023-11-07 – 2023-11-08 (×2): 2 [IU] via SUBCUTANEOUS
  Administered 2023-11-08: 1 [IU] via SUBCUTANEOUS
  Administered 2023-11-09: 3 [IU] via SUBCUTANEOUS
  Administered 2023-11-09: 1 [IU] via SUBCUTANEOUS
  Filled 2023-10-16 (×65): qty 1

## 2023-10-16 MED ORDER — INSULIN ASPART 100 UNIT/ML IJ SOLN
0.0000 [IU] | Freq: Every day | INTRAMUSCULAR | Status: DC
  Administered 2023-10-17: 3 [IU] via SUBCUTANEOUS
  Administered 2023-10-18: 2 [IU] via SUBCUTANEOUS
  Administered 2023-10-19: 3 [IU] via SUBCUTANEOUS
  Administered 2023-10-20 – 2023-11-03 (×6): 2 [IU] via SUBCUTANEOUS
  Administered 2023-11-04: 3 [IU] via SUBCUTANEOUS
  Filled 2023-10-16 (×11): qty 1

## 2023-10-16 MED ORDER — RISAQUAD PO CAPS
1.0000 | ORAL_CAPSULE | Freq: Every day | ORAL | Status: DC
Start: 2023-10-17 — End: 2023-11-10
  Administered 2023-10-17 – 2023-11-10 (×25): 1 via ORAL
  Filled 2023-10-16 (×25): qty 1

## 2023-10-16 MED ORDER — LEVOTHYROXINE SODIUM 100 MCG PO TABS
100.0000 ug | ORAL_TABLET | Freq: Every day | ORAL | Status: DC
  Administered 2023-10-17 – 2023-11-10 (×25): 100 ug via ORAL
  Filled 2023-10-16 (×2): qty 1
  Filled 2023-10-16: qty 2
  Filled 2023-10-16 (×22): qty 1

## 2023-10-16 MED ORDER — HYDRALAZINE HCL 20 MG/ML IJ SOLN: 5.0000 mg | INTRAMUSCULAR | Status: AC | PRN

## 2023-10-16 MED ORDER — RIFAXIMIN 550 MG PO TABS
550.0000 mg | ORAL_TABLET | Freq: Two times a day (BID) | ORAL | Status: DC
  Administered 2023-10-16 – 2023-11-10 (×49): 550 mg via ORAL
  Filled 2023-10-16 (×52): qty 1

## 2023-10-16 MED ORDER — LACTULOSE 10 GM/15ML PO SOLN
10.0000 g | Freq: Once | ORAL | Status: AC
  Administered 2023-10-16: 10 g via ORAL
  Filled 2023-10-16: qty 30

## 2023-10-16 MED ORDER — ACETAMINOPHEN 325 MG PO TABS
650.0000 mg | ORAL_TABLET | Freq: Four times a day (QID) | ORAL | Status: AC | PRN
Start: 2023-10-16 — End: 2023-10-22
  Administered 2023-10-21: 650 mg via ORAL
  Filled 2023-10-16: qty 2

## 2023-10-16 MED ORDER — ONDANSETRON HCL 4 MG/2ML IJ SOLN: 4.0000 mg | Freq: Four times a day (QID) | INTRAMUSCULAR | Status: AC | PRN

## 2023-10-16 MED ORDER — ACETAMINOPHEN 650 MG RE SUPP: 650.0000 mg | Freq: Four times a day (QID) | RECTAL | Status: AC | PRN

## 2023-10-16 NOTE — ED Provider Notes (Signed)
[facility] Provider Note    Event Date/Time   First MD Initiated Contact with Patient 10/16/23 1520     (approximate)   History   Weakness   HPI Joshua Berry is a 77 y.o. male with history of cirrhosis, esophageal varices, GI bleeding, anemia, DM2, HTN, thrombocytopenia presenting today for weakness.  Patient presents from his rehab facility after episode of weakness that has lasted the entire day.  Not able to stand or support his weight at this time.  Separately, patient and family member noted an episode today where he had difficulty speaking.  It seemed like difficulty word finding that lasted approximately an hour before resolving.  Denied any other numbness or specific one-sided weakness.  No slurred speech noted at this time.  Otherwise denies recent fever, chills, cough, congestion, chest pain, abdominal pain, nausea, vomiting.  They state he has had diarrhea a couple times over the past few days but have not noticed any blood or black stools.  Chart review: Patient does have recent admission to the hospital for hepatic encephalopathy.  Initially presented for a fall and was encephalopathic.  Was found to have elevated ammonia level as well as CT head showing mixed density subdural hematomas.  He was admitted for hepatic encephalopathy requiring lactulose and rifaximin.     Physical Exam   Triage Vital Signs: ED Triage Vitals [10/16/23 1139]  Encounter Vitals Group     BP 102/68     Systolic BP Percentile      Diastolic BP Percentile      Pulse Rate 76     Resp 16     Temp 98.1 F (36.7 C)     Temp Source Oral     SpO2 100 %     Weight      Height      Head Circumference      Peak Flow      Pain Score      Pain Loc      Pain Education      Exclude from Growth Chart     Most recent vital signs: Vitals:   10/16/23 1139  BP: 102/68  Pulse: 76  Resp: 16  Temp: 98.1 F (36.7 C)  SpO2: 100%   Physical Exam: I have reviewed the vital  signs and nursing notes. General: Awake, alert, no acute distress.  Chronically ill appearing.  Weak appearing. Head:  Atraumatic, normocephalic.   ENT:  EOM intact, PERRL. Oral mucosa is pink and moist with no lesions. Neck: Neck is supple with full range of motion, No meningeal signs. Cardiovascular:  RRR, No murmurs. Peripheral pulses palpable and equal bilaterally. Respiratory:  Symmetrical chest wall expansion.  No rhonchi, rales, or wheezes.  Good air movement throughout.  No use of accessory muscles.   Musculoskeletal:  No cyanosis or edema. Moving extremities with full ROM Abdomen:  Soft, nontender, nondistended. Neuro:  GCS 15, moving all four extremities, interacting appropriately. Speech clear.  5 out of 5 strength in bilateral upper and lower extremities.  Sensation intact to bilateral upper and lower extremities and equal.  Cranial nerves II through XII intact without issue. Asterixis noted. Psych:  Calm, appropriate.   Skin:  Warm, dry, no rash.    ED Results / Procedures / Treatments   Labs (all labs ordered are listed, but only abnormal results are displayed) Labs Reviewed  BASIC METABOLIC PANEL - Abnormal; Notable for the following components:      Result  Value   Glucose, Bld 157 (*)    Calcium 8.8 (*)    All other components within normal limits  CBC - Abnormal; Notable for the following components:   WBC 2.9 (*)    RBC 3.64 (*)    Hemoglobin 11.5 (*)    HCT 33.1 (*)    Platelets 59 (*)    All other components within normal limits  AMMONIA - Abnormal; Notable for the following components:   Ammonia 89 (*)    All other components within normal limits  MAGNESIUM - Abnormal; Notable for the following components:   Magnesium 1.5 (*)    All other components within normal limits  URINALYSIS, ROUTINE W REFLEX MICROSCOPIC  PHOSPHORUS  CBG MONITORING, ED     EKG    RADIOLOGY Independently interpreted CT head with evidence of new acute on chronic subdural  hematoma   PROCEDURES:  Critical Care performed: No  Procedures   MEDICATIONS ORDERED IN ED: Medications  magnesium sulfate IVPB 2 g 50 mL (has no administration in time range)  lactulose (CHRONULAC) 10 GM/15ML solution 10 g (has no administration in time range)     IMPRESSION / MDM / ASSESSMENT AND PLAN / ED COURSE  I reviewed the triage vital signs and the nursing notes.                              Differential diagnosis includes, but is not limited to, hepatic encephalopathy, electrolyte abnormality, lower suspicion for CVA, acute on chronic subdural  Patient's presentation is most consistent with acute complicated illness / injury requiring diagnostic workup.  Patient is a 77 year old male presenting today for worsening weakness over the past 24 hours associated with 1 episode of difficulty word finding and aphasia.  Aphasia has resolved and able to talk fluently.  No obvious focal neurological deficits outside of global weakness.  Notable asterixis on exam.  Vital signs are stable.  Laboratory workup notable for hyperammonemia at 89 which is increased from his discharge level several weeks ago.  Also notable for hypomagnesemia.  Patient was given magnesium and lactulose.  No reported missed doses at the rehab facility.  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  No evidence of a fall recently per family.  Spoke with Dr. Katrinka Blazing with neurosurgery who recommends no immediate intervention but does recommend repeat head CT in 6 hours and they will continue to follow along.  Patient was admitted to hospitalist for further care given ongoing weakness associated with hyperammonemia and acute on chronic subdurals.  The patient is on the cardiac monitor to evaluate for evidence of arrhythmia and/or significant heart rate changes. Clinical Course as of 10/16/23 1624  Thu Oct 16, 2023  1548 Spoke with neurosurgery regarding CT head results.  No  immediate intervention required at this time.  Does recommend repeat head CT in 6 hours and they will continue to follow along. [DW]    Clinical Course User Index [DW] Janith Lima, MD     FINAL CLINICAL IMPRESSION(S) / ED DIAGNOSES   Final diagnoses:  Hepatic encephalopathy (HCC)  Subdural hematoma (HCC)  Hypomagnesemia     Rx / DC Orders   ED Discharge Orders     None        Note:  This document was prepared using Dragon voice recognition software and may include unintentional dictation errors.   Janith Lima, MD 10/16/23  1624  

## 2023-10-16 NOTE — Hospital Course (Addendum)
[name] is a 77 year old male with history of liver cirrhosis, hypertension, history of GI bleed, thrombocytopenia, insulin-dependent diabetes mellitus, GERD, who presents emergency department for chief concerns of altered mental status. Ammonia is 89.  Magnesium is 1.5.  CT head showed Mix density subdural hematomas overlying bilateral cerebral hemispheres.  These hematomas have not significantly changed in size since CT head of 09/15/2023.  However there are hyperdense components within both subdural hematoma suggesting interval hemorrhage into these collections.  Balance mass effect upon the underlying cerebral hemispheres within midline shift. Repeated CT head after 6 hours did not show any changes. Patient is seen by neurosurgery, no need for surgery intervention. Patient was also given lactulose.

## 2023-10-16 NOTE — Assessment & Plan Note (Signed)
Neurosurgery specialist has been consulted and recommends repeat CT of the head without contrast in 6 hours CT of the head without contrast has been ordered by EDP Hydralazine 5 mg IV every 4 hours as needed for SBP greater than 155, 4 days ordered Admit to PCU, inpatient

## 2023-10-16 NOTE — Assessment & Plan Note (Signed)
 -  Fall precaution

## 2023-10-16 NOTE — Assessment & Plan Note (Signed)
Home PPI, Protonix 40 mg p.o. twice daily resumed

## 2023-10-16 NOTE — Assessment & Plan Note (Signed)
Secondary to [employer]

## 2023-10-16 NOTE — ED Triage Notes (Signed)
Arrives from [facility] via [place].  Patient c/o right shoulder pain.  Per report patient family concerned for 'brain bleeds'.    AOx3.  VS wnl.

## 2023-10-16 NOTE — ED Notes (Signed)
Left voicemail for Dr. Sedalia Muta regarding need for 5 mg Terazosin QHS

## 2023-10-16 NOTE — Assessment & Plan Note (Signed)
Home levothyroxine 100 mcg daily resumed

## 2023-10-16 NOTE — Consult Note (Signed)
Reviewed the patient's imaging.  Does not demonstrate any worsening of his subdural collections.  These appear to be quite stable.  On repeat scan there showed no clear expansion.  At this point would optimize him medically as he has some metabolic comorbidities that are likely causing his mental status changes.  Once he is optimized if he does not have any improvement we will plan for another CT scan.

## 2023-10-16 NOTE — Assessment & Plan Note (Signed)
Magnesium 2 g IV ordered for transfusion by EDP Recheck magnesium level in the a.m.

## 2023-10-16 NOTE — ED Triage Notes (Signed)
Pt daughter reports pt coming from [facility] d/t weakness, slurred speech, and was last seen normal yesterday evening. Pt is having mild Altered mental status.

## 2023-10-16 NOTE — H&P (Addendum)
History and Physical   Joshua Berry WGN:[id number] DOB: 1946-11-04 DOA: 10/16/2023  PCP: [facility]  Outpatient Specialists: Dr. Marcell Barlow, neurosurgery Patient coming from: [facility] via EMS  I have personally briefly reviewed patient's old medical records in [facility] EMR.  Chief Concern: Altered mental status  HPI: [name] is a 77 year old male with history of liver cirrhosis, hypertension, history of GI bleed, thrombocytopenia, insulin-dependent diabetes mellitus, GERD, who presents emergency department for chief concerns of altered mental status.  Vitals in the ED showed temperature of 98.1, respiration rate of 16, heart rate of 76, blood pressure 102/68, SpO2 100% on room air.  Serum sodium is 137, potassium 3.5, chloride 106, bicarb 22, BUN of 10, serum creatinine 0.78, EGFR greater than 60, nonfasting blood glucose 157, WBC 2.9, hemoglobin 11.5, platelets of 59.  Ammonia is 89.  Magnesium is 1.5.  CT head wo contrast: Mix density subdural hematomas overlying bilateral cerebral hemispheres.  These hematomas have not significantly changed in size since CT head of 09/15/2023.  However there are hyperdense components within both subdural hematoma suggesting interval hemorrhage into these collections.  Balance mass effect upon the underlying cerebral hemispheres within midline shift.  ED treatment: Magnesium 2 g IV one-time dose, lactulose 10 g IV p.o. one-time dose. ------------------------------- At bedside, patient was able to tell me his name, his age, location, current calendar year.  Patient reports no falling or head trauma today.  He denies chest pain, shortness of breath, dysuria, hematuria, diarrhea, blood in his stool, nausea, vomiting.  Social history: He is currently at [facility] facility.  He denies tobacco, EtOH, recreational drug use.  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  ROS: Constitutional: no weight change, no fever ENT/Mouth: no sore throat, no rhinorrhea Eyes: no eye pain, no vision changes Cardiovascular: no chest pain, no dyspnea,  no edema, no palpitations Respiratory: no cough, no sputum, no wheezing Gastrointestinal: no nausea, no vomiting, no diarrhea, no constipation Genitourinary: no urinary incontinence, no dysuria, no hematuria Musculoskeletal: no arthralgias, no myalgias Skin: no skin lesions, no pruritus, Neuro: + weakness, no loss of consciousness, no syncope Psych: no anxiety, no depression, + decrease appetite Heme/Lymph: no bruising, no bleeding  ED Course: Discussed with emergency medicine provider, patient requiring hospitalization for chief concerns of hepatic encephalopathy.  Assessment/Plan  Principal Problem:   Subdural hematoma (HCC) Active Problems:   Hepatic encephalopathy (HCC)   HLD (hyperlipidemia)   GERD without esophagitis   Hypothyroidism   Secondary esophageal varices without bleeding (HCC)   Physical deconditioning   Hypomagnesemia   Assessment and Plan:  * Subdural hematoma [facility] Neurosurgery specialist has been consulted and recommends repeat CT of the head without contrast in 6 hours CT of the head without contrast has been ordered by EDP Hydralazine 5 mg IV every 4 hours as needed for SBP greater than 155, 4 days ordered Admit to PCU, inpatient  Hepatic encephalopathy (HCC) Ammonia elevated on admission at 89 Status post lactulose 10 g p.o. one-time dose Check LFTs on admission Resumed home rifaximin 550 mg p.o. twice daily, lactulose 30 g p.o. 3 times daily Strict I's and O's PCU, inpatient  GERD without esophagitis Home PPI, Protonix 40 mg p.o. twice daily resumed  Hypothyroidism Home levothyroxine 100 mcg daily resumed  Hypomagnesemia Magnesium 2 g IV ordered for transfusion by EDP Recheck magnesium level in the a.m.  Physical deconditioning Fall precaution  Secondary  esophageal  varices without bleeding (HCC) Secondary to [facility]  Chart reviewed.   DVT prophylaxis: TED hose; no pharmacologic DVT prophylaxis on admission due to subdural hematoma Code Status: DNR/DNI confirmed with patient and daughter [name] at bedside Diet: Heart healthy Family Communication: Update daughter, [name] at bedside with patient's permission Disposition Plan: Pending clinical course Consults called: Neurosurgery Admission status: PCU, inpatient  Past Medical History:  Diagnosis Date   Arthritis    Bleeding ulcer    Cancer (HCC)    melanoma, carcinoma   Cirrhosis of liver (HCC)    Diabetes mellitus without complication (HCC)    Fall    Heart murmur    Hypertension    Iron deficiency anemia    NASH (nonalcoholic steatohepatitis)    Sleep apnea    Spleen enlarged    Thrombocytopenia (HCC)    Varices, esophageal (HCC)    and gastric per spouse   Past Surgical History:  Procedure Laterality Date   BURR HOLE Bilateral 07/14/2023   Procedure: BURR HOLES;  Surgeon: Venetia Night, MD;  Location: [facility] ORS;  Service: Neurosurgery;  Laterality: Bilateral;   CHOLECYSTECTOMY     COLONOSCOPY N/A 07/23/2019   Procedure: COLONOSCOPY;  Surgeon: Toney Reil, MD;  Location: [facility] ENDOSCOPY;  Service: Gastroenterology;  Laterality: N/A;   ESOPHAGOGASTRODUODENOSCOPY N/A 07/20/2019   Procedure: ESOPHAGOGASTRODUODENOSCOPY (EGD);  Surgeon: Toney Reil, MD;  Location: [facility] ENDOSCOPY;  Service: Gastroenterology;  Laterality: N/A;   ESOPHAGOGASTRODUODENOSCOPY Left 08/20/2019   Procedure: ESOPHAGOGASTRODUODENOSCOPY (EGD);  Surgeon: Pasty Spillers, MD;  Location: [facility] ENDOSCOPY;  Service: Endoscopy;  Laterality: Left;   ESOPHAGOGASTRODUODENOSCOPY (EGD) WITH PROPOFOL N/A 04/25/2021   Procedure: ESOPHAGOGASTRODUODENOSCOPY (EGD) WITH PROPOFOL;  Surgeon: Toney Reil, MD;  Location: [facility] ENDOSCOPY;  Service: Gastroenterology;  Laterality: N/A;   MECHANICAL AORTIC  VALVE REPLACEMENT     SKIN BIOPSY     TEE WITHOUT CARDIOVERSION N/A 12/23/2018   Procedure: TRANSESOPHAGEAL ECHOCARDIOGRAM (TEE);  Surgeon: Dalia Heading, MD;  Location: [facility] ORS;  Service: Cardiovascular;  Laterality: N/A;   TEE WITHOUT CARDIOVERSION N/A 05/21/2022   Procedure: TRANSESOPHAGEAL ECHOCARDIOGRAM (TEE);  Surgeon: Antonieta Iba, MD;  Location: [facility] ORS;  Service: Cardiovascular;  Laterality: N/A;   TEE WITHOUT CARDIOVERSION N/A 04/09/2023   Procedure: TRANSESOPHAGEAL ECHOCARDIOGRAM;  Surgeon: Debbe Odea, MD;  Location: [facility] ORS;  Service: Cardiovascular;  Laterality: N/A;   Social History:  reports that he quit smoking about 55 years ago. His smoking use included cigarettes. He started smoking about 60 years ago. He has a 2.5 pack-year smoking history. He has never used smokeless tobacco. He reports that he does not currently use alcohol. He reports that he does not currently use drugs.  Allergies  Allergen Reactions   Camphor Swelling and Rash   Latex Rash   Lisinopril Cough    Other Reaction(s): Cough   Sulfamethoxazole Swelling   Chocolate     Family states he is allergic   Nsaids Other (See Comments)    NASH   Family History  Problem Relation Age of Onset   Heart attack Brother    Family history: Family history reviewed and not pertinent.  Prior to Admission medications   Medication Sig Start Date End Date Taking? Authorizing Provider  atorvastatin (LIPITOR) 20 MG tablet Take 20 mg by mouth at bedtime.   Yes [provider]  butalbital-acetaminophen-caffeine (FIORICET) 50-325-40 MG tablet Take 1 tablet by mouth every 8 (eight) hours as needed for headache. 08/19/23  Yes Alford Highland, MD  Cholecalciferol 50 MCG (  2000 UT) TABS Take 1 tablet by mouth daily.   Yes [provider]  clotrimazole (LOTRIMIN) 1 % external solution Apply 1 Application topically 2 (two) times daily. Apply to toe nails 02/12/23  Yes [provider]   Emollient (GOLD BOND ULTIMATE) LOTN Apply 1 Application topically in the morning and at bedtime. Apply to bilateral lower legs for dry skin 10/13/23  Yes [provider]  famotidine (PEPCID) 20 MG tablet Take 20 mg by mouth daily.   Yes [provider]  furosemide (LASIX) 20 MG tablet Take 1 tablet (20 mg total) by mouth daily. 04/07/22  Yes Arnetha Courser, MD  insulin aspart (NOVOLOG) 100 UNIT/ML injection Inject 5 Units into the skin 3 (three) times daily before meals. 08/19/23  Yes Wieting, Richard, MD  insulin glargine (LANTUS) 100 UNIT/ML injection Inject 0.1 mLs (10 Units total) into the skin at bedtime. 08/19/23  Yes Wieting, Richard, MD  ketotifen (ZADITOR) 0.025 % ophthalmic solution Place 1 drop into both eyes 2 (two) times daily.   Yes [provider]  Lactobacillus Rhamnosus, GG, (RA PROBIOTIC DIGESTIVE CARE) CAPS Take 1 capsule by mouth daily.   Yes [provider]  lactulose (CHRONULAC) 10 GM/15ML solution Take 45 mLs (30 g total) by mouth 3 (three) times daily. 09/25/23  Yes Loyce Dys, MD  levothyroxine (SYNTHROID) 100 MCG tablet Take 100 mcg by mouth daily before breakfast.   Yes [provider]  lidocaine (LIDODERM) 5 % Place 2 patches onto the skin daily. Remove & Discard patch within 12 hours or as directed by MD 05/28/22  Yes [name], MD  magnesium oxide (MAG-OX) 400 (240 Mg) MG tablet Take 1 tablet (400 mg total) by mouth daily. 08/19/23  Yes Wieting, Richard, MD  melatonin 3 MG TABS tablet Take 6 mg by mouth at bedtime.   Yes [provider]  Multiple Vitamin (MULTIVITAMIN WITH MINERALS) TABS tablet Take 1 tablet by mouth daily. 08/01/23  Yes Gillis Santa, MD  Omega-3 Fatty Acids (FISH OIL) 1000 MG CAPS Take 1 capsule by mouth 2 (two) times daily.   Yes [provider]  pantoprazole (PROTONIX) 40 MG tablet Take 1 tablet (40 mg total) by mouth 2 (two) times daily. 09/25/23 11/24/23 Yes Djan, Scarlette Calico, MD  polyethylene  glycol (MIRALAX / GLYCOLAX) 17 g packet Take 17 g by mouth daily as needed for mild constipation.   Yes [provider]  rifaximin (XIFAXAN) 550 MG TABS tablet Take 550 mg by mouth 2 (two) times daily. Takes with lactulose   Yes [provider]  terazosin (HYTRIN) 5 MG capsule Take 5 mg by mouth at bedtime.   Yes [provider]  thiamine (VITAMIN B-1) 100 MG tablet Take 1 tablet (100 mg total) by mouth daily. 09/26/23  Yes Loyce Dys, MD  acetaminophen (TYLENOL) 500 MG tablet Take 1 tablet (500 mg total) by mouth every 6 (six) hours as needed for moderate pain, fever, headache or mild pain (headache). 07/31/23   Gillis Santa, MD  feeding supplement (ENSURE ENLIVE / ENSURE PLUS) LIQD Take 237 mLs by mouth 3 (three) times daily between meals. 07/31/23   Gillis Santa, MD  trolamine salicylate (ASPERCREME) 10 % cream Apply topically as needed for muscle pain. Patient not taking: Reported on 10/16/2023 05/28/22   [name], MD  zinc oxide 20 % ointment Apply 1 Application topically in the morning and at bedtime. APPLY SMALL AMOUNT TO AFFECTED AREA TWICE A DAY FOR BUTTOCK RASH FOR  BUTTOCK RASH Patient not taking: Reported on 10/16/2023 05/13/23   [provider]   Physical Exam: Vitals:   10/16/23 1139 10/16/23 1641  BP: 102/68   Pulse: 76   Resp: 16   Temp: 98.1 F (36.7 C) 98.4 F (36.9 C)  TempSrc: Oral Oral  SpO2: 100%    Constitutional: appears frail, cachectic, weak Eyes: PERRL, lids and conjunctivae normal HENMT: Bilateral temporal wasting, mucous membranes are moist. Posterior pharynx clear of any exudate or lesions. Age-appropriate dentition. Hearing appropriate Neck: normal, supple, no masses, no thyromegaly Respiratory: clear to auscultation bilaterally, no wheezing, no crackles. Normal respiratory effort. No accessory muscle use.  Cardiovascular: Regular rate and rhythm, no murmurs / rubs / gallops. No extremity edema. 2+ pedal pulses. No  carotid bruits.  Abdomen: no tenderness, no masses palpated, no hepatosplenomegaly. Bowel sounds positive.  Musculoskeletal: no clubbing / cyanosis. No joint deformity upper and lower extremities. Good ROM, no contractures, no atrophy. Normal muscle tone.  Bilateral upper extremity asterixis. Skin: no rashes, lesions, ulcers. No induration Neurologic: Sensation intact. Strength 4/5 in bilateral upper extremities.  Strength is 3 out of 5 in bilateral lower extremities. Psychiatric: Normal judgment and insight. Alert and oriented x 3. Normal mood.   EKG: ordered pending completion  Chest x-ray on Admission: Not indicated at this time  CT Head Wo Contrast  Result Date: 10/16/2023 CLINICAL DATA:  Provided history: Altered mental status, nontraumatic. EXAM: CT HEAD WITHOUT CONTRAST TECHNIQUE: Contiguous axial images were obtained from the base of the skull through the vertex without intravenous contrast. RADIATION DOSE REDUCTION: This exam was performed according to the departmental dose-optimization program which includes automated exposure control, adjustment of the mA and/or kV according to patient size and/or use of iterative reconstruction technique. COMPARISON:  Head CT 09/15/2023. FINDINGS: Brain: Mild generalized parenchymal atrophy. A mixed density subdural hematoma overlying the right cerebral hemisphere has not significantly changed in size since the head CT of 08/19/2023, again measuring up to 8 mm in thickness (remeasured on prior). Moderate-volume hyperdense components are present within this hematoma. A mixed density subdural hematoma overlying the left cerebral hemisphere has not significantly changed in size since the prior CT, also measuring up to 8 mm in thickness (remeasured on prior). Small-volume hyperdense components are present within this hematoma. Balanced mass effect upon the bilateral cerebral hemispheres without midline shift. Patchy and ill-defined hypoattenuation within the  cerebral white matter, nonspecific but compatible with mild chronic small vessel ischemic disease. No acute demarcated cortical infarct. No evidence of an intracranial mass. Vascular: No hyperdense vessel.  Atherosclerotic calcifications Skull: No acute calvarial fracture or aggressive osseous lesion. Bilateral frontoparietal burr holes. Sinuses/Orbits: No mass or acute finding within the imaged orbits. Mucosal thickening and secretions within a posterior left ethmoid air cell. Impression #1 called by telephone at the time of interpretation on 10/16/2023 at 12:14 pm to provider Dr. [name], who verbally acknowledged these results. IMPRESSION: 1. Mixed density subdural hematomas overlying the bilateral cerebral hemispheres. These hematomas have not significant changed in size since the head CT of 09/15/2023. However, there are hyperdense components within both subdural hematomas suggesting interval hemorrhage into these collections. Balanced mass effect upon the underlying cerebral hemispheres without midline shift. 2. Mild chronic small ischemic changes within the cerebral white matter. 3. Mild generalized parenchymal atrophy. Electronically Signed   By: Jackey Loge D.O.   On: 10/16/2023 12:18    Labs on Admission: I have personally reviewed following labs  CBC: Recent Labs  Lab 10/16/23  1139  WBC 2.9*  HGB 11.5*  HCT 33.1*  MCV 90.9  PLT 59*   Basic Metabolic Panel: Recent Labs  Lab 10/16/23 1139 10/16/23 1625  NA 137  --   K 3.5  --   CL 106  --   CO2 22  --   GLUCOSE 157*  --   BUN 10  --   CREATININE 0.78  --   CALCIUM 8.8*  --   MG 1.5*  --   PHOS  --  3.9   GFR: CrCl cannot be calculated (Unknown ideal weight.).  Recent Labs  Lab 10/16/23 1139  AMMONIA 89*   Urine analysis:    Component Value Date/Time   COLORURINE YELLOW (A) 10/16/2023 1625   APPEARANCEUR CLEAR (A) 10/16/2023 1625   APPEARANCEUR Hazy 05/01/2014 1653   LABSPEC 1.018 10/16/2023 1625   LABSPEC 1.015  05/01/2014 1653   PHURINE 7.0 10/16/2023 1625   GLUCOSEU NEGATIVE 10/16/2023 1625   GLUCOSEU Negative 05/01/2014 1653   HGBUR NEGATIVE 10/16/2023 1625   BILIRUBINUR NEGATIVE 10/16/2023 1625   BILIRUBINUR Negative 05/01/2014 1653   KETONESUR NEGATIVE 10/16/2023 1625   PROTEINUR NEGATIVE 10/16/2023 1625   NITRITE NEGATIVE 10/16/2023 1625   LEUKOCYTESUR NEGATIVE 10/16/2023 1625   LEUKOCYTESUR Negative 05/01/2014 1653   This document was prepared using Dragon Voice Recognition software and may include unintentional dictation errors.  Dr. Sedalia Muta [facility]  If 7PM-7AM, please contact overnight-coverage provider If 7AM-7PM, please contact day attending provider [email or web address]  10/16/2023, 5:30 PM

## 2023-10-16 NOTE — Assessment & Plan Note (Addendum)
Ammonia elevated on admission at 89 Status post lactulose 10 g p.o. one-time dose Check LFTs on admission Resumed home rifaximin 550 mg p.o. twice daily, lactulose 30 g p.o. 3 times daily Strict I's and O's PCU, inpatient

## 2023-10-16 NOTE — ED Provider Triage Note (Signed)
Emergency Medicine Provider Triage Evaluation Note  Joshua Berry , a 77 y.o. male  was evaluated in triage.  Pt complains of altered mental status.  Last known well was early yesterday.  History of subdural.  Daughter states started acting altered yesterday afternoon could not remember things and could not stand is normal.  Patient states he has had 2 days of diarrhea.  Daughter states also sometimes his ammonia level gets elevated and his magnesium will get low to cause him that his wife.  Patient is staying at [facility].  Review of Systems  Positive:  Negative:   Physical Exam  There were no vitals taken for this visit. Gen:   Awake, no distress   Resp:  Normal effort  MSK:   Moves extremities without difficulty  Other:    Medical Decision Making  Medically screening exam initiated at 11:40 AM.  Appropriate orders placed.  Joshua Berry was informed that the remainder of the evaluation will be completed by another provider, this initial triage assessment does not replace that evaluation, and the importance of remaining in the ED until their evaluation is complete.     Faythe Ghee, PA-C 10/16/23 1141

## 2023-10-17 ENCOUNTER — Other Ambulatory Visit: Payer: Self-pay

## 2023-10-17 ENCOUNTER — Encounter: Payer: Self-pay | Admitting: Internal Medicine

## 2023-10-17 DIAGNOSIS — I629 Nontraumatic intracranial hemorrhage, unspecified: Secondary | ICD-10-CM

## 2023-10-17 DIAGNOSIS — K7682 Hepatic encephalopathy: Secondary | ICD-10-CM | POA: Diagnosis not present

## 2023-10-17 LAB — BASIC METABOLIC PANEL
Anion gap: 7 (ref 5–15)
BUN: 13 mg/dL (ref 8–23)
CO2: 22 mmol/L (ref 22–32)
Calcium: 8.5 mg/dL — ABNORMAL LOW (ref 8.9–10.3)
Chloride: 108 mmol/L (ref 98–111)
Creatinine, Ser: 0.84 mg/dL (ref 0.61–1.24)
GFR, Estimated: >60 mL/min (ref >60)
Glucose, Bld: 129 mg/dL — ABNORMAL HIGH (ref 70–99)
Potassium: 3.5 mmol/L (ref 3.5–5.1)
Sodium: 137 mmol/L (ref 135–145)

## 2023-10-17 LAB — MAGNESIUM: Magnesium: 1.8 mg/dL (ref 1.7–2.4)

## 2023-10-17 LAB — CBC
HCT: 29.7 % — ABNORMAL LOW (ref 39.0–52.0)
Hemoglobin: 10.2 g/dL — ABNORMAL LOW (ref 13.0–17.0)
MCH: 32.2 pg (ref 26.0–34.0)
MCHC: 34.3 g/dL (ref 30.0–36.0)
MCV: 93.7 fL (ref 80.0–100.0)
Platelets: 56 10*3/uL — ABNORMAL LOW (ref 150–400)
RBC: 3.17 MIL/uL — ABNORMAL LOW (ref 4.22–5.81)
RDW: 15.7 % — ABNORMAL HIGH (ref 11.5–15.5)
WBC: 2.8 10*3/uL — ABNORMAL LOW (ref 4.0–10.5)
nRBC: 0 % (ref 0.0–0.2)

## 2023-10-17 LAB — GLUCOSE, CAPILLARY: Glucose-Capillary: 261 mg/dL — ABNORMAL HIGH (ref 70–99)

## 2023-10-17 LAB — CBG MONITORING, ED
Glucose-Capillary: 175 mg/dL — ABNORMAL HIGH (ref 70–99)
Glucose-Capillary: 226 mg/dL — ABNORMAL HIGH (ref 70–99)
Glucose-Capillary: 204 mg/dL — ABNORMAL HIGH (ref 70–99)

## 2023-10-17 MED ORDER — LACTULOSE 10 GM/15ML PO SOLN
30.0000 g | Freq: Four times a day (QID) | ORAL | Status: DC
  Administered 2023-10-17 – 2023-10-20 (×14): 30 g via ORAL
  Filled 2023-10-17 (×14): qty 60

## 2023-10-17 MED ORDER — POTASSIUM CHLORIDE CRYS ER 20 MEQ PO TBCR
40.0000 meq | EXTENDED_RELEASE_TABLET | Freq: Once | ORAL | Status: AC
  Administered 2023-10-17: 40 meq via ORAL
  Filled 2023-10-17: qty 2

## 2023-10-17 NOTE — ED Notes (Signed)
Floor notified of patient's departure to the unit

## 2023-10-17 NOTE — Progress Notes (Signed)
  Progress Note   [name] ZOX:[id number] DOB: 1946-12-10 DOA: 10/16/2023     1 DOS: the patient was seen and examined on 10/17/2023   Brief hospital course: [name] is a 77 year old male with history of liver cirrhosis, hypertension, history of GI bleed, thrombocytopenia, insulin-dependent diabetes mellitus, GERD, who presents emergency department for chief concerns of altered mental status. Ammonia is 89.  Magnesium is 1.5.  CT head showed Mix density subdural hematomas overlying bilateral cerebral hemispheres.  These hematomas have not significantly changed in size since CT head of 09/15/2023.  However there are hyperdense components within both subdural hematoma suggesting interval hemorrhage into these collections.  Balance mass effect upon the underlying cerebral hemispheres within midline shift. Repeated CT head after 6 hours did not show any changes.   Principal Problem:   Subdural hematoma (HCC) Active Problems:   Hepatic encephalopathy (HCC)   Pancytopenia (HCC)   HLD (hyperlipidemia)   GERD without esophagitis   Hypothyroidism   Secondary esophageal varices without bleeding (HCC)   Physical deconditioning   Hypomagnesemia   Assessment and Plan: Intracranial hemorrhage. Neurosurgery specialist has been consulted and recommends repeat CT of the head without contrast in 6 hours showed no change. I have discussed with Dr. Madaline Brilliant, no surgery intervention.  Hepatic encephalopathy (HCC) Ammonia elevated on admission at 89 Patient was resumed on home dose of lactulose and rifaximin.  Has not had a bowel movement, increase lactulose to 4 times a day.  Continue to follow.  GERD without esophagitis Continue Protonix.  Hypothyroidism Home levothyroxine 100 mcg daily resumed  Hypomagnesemia Magnesium level normalized.  Potassium is 3.5, give 40 mEq of KCl orally.  Physical deconditioning Fall precaution  Secondary esophageal varices without bleeding  (HCC) Secondary to [name]       Subjective:  Patient doing better today, less confused. No nausea vomiting.  Has not had a bowel movement since admission.  Physical Exam: Vitals:   10/17/23 0513 10/17/23 0746 10/17/23 1013 10/17/23 1019  BP: 112/61  (!) 109/56   Pulse: (!) 56  61   Resp: 18  13   Temp: 98.5 F (36.9 C)   98 F (36.7 C)  TempSrc: Oral   Oral  SpO2: 100%  98%   Height:  6\' 2"  (1.88 m)     General exam: Appears calm and comfortable  Respiratory system: Clear to auscultation. Respiratory effort normal. Cardiovascular system: S1 & S2 heard, RRR. No JVD, murmurs, rubs, gallops or clicks. No pedal edema. Gastrointestinal system: Abdomen is nondistended, soft and nontender. No organomegaly or masses felt. Normal bowel sounds heard. Central nervous system: Alert and oriented. No focal neurological deficits. Extremities: Symmetric 5 x 5 power. Skin: No rashes, lesions or ulcers Psychiatry: Judgement and insight appear normal. Mood & affect appropriate.    Data Reviewed:  CT scan and lab results reviewed.  Family Communication: Daughter updated at bedside.  Disposition: Status is: Observation      Time spent: 35 minutes  Author: Marrion Coy, MD 10/17/2023 12:55 PM  For on call review [email or web address].

## 2023-10-17 NOTE — Plan of Care (Signed)
  Problem: Education: Goal: Knowledge of General Education information will improve Description: Including pain rating scale, medication(s)/side effects and non-pharmacologic comfort measures Outcome: Progressing   Problem: Clinical Measurements: Goal: Respiratory complications will improve Outcome: Progressing   Problem: Nutrition: Goal: Adequate nutrition will be maintained Outcome: Progressing   Problem: Pain Management: Goal: General experience of comfort will improve Outcome: Progressing   Problem: Safety: Goal: Ability to remain free from injury will improve Outcome: Progressing

## 2023-10-18 DIAGNOSIS — D61818 Other pancytopenia: Secondary | ICD-10-CM | POA: Diagnosis not present

## 2023-10-18 DIAGNOSIS — K7682 Hepatic encephalopathy: Secondary | ICD-10-CM | POA: Diagnosis not present

## 2023-10-18 DIAGNOSIS — S065XAA Traumatic subdural hemorrhage with loss of consciousness status unknown, initial encounter: Secondary | ICD-10-CM | POA: Diagnosis not present

## 2023-10-18 LAB — BASIC METABOLIC PANEL
Anion gap: 8 (ref 5–15)
BUN: 10 mg/dL (ref 8–23)
CO2: 21 mmol/L — ABNORMAL LOW (ref 22–32)
Calcium: 9 mg/dL (ref 8.9–10.3)
Chloride: 108 mmol/L (ref 98–111)
Creatinine, Ser: 0.62 mg/dL (ref 0.61–1.24)
GFR, Estimated: >60 mL/min (ref >60)
Glucose, Bld: 149 mg/dL — ABNORMAL HIGH (ref 70–99)
Potassium: 3.9 mmol/L (ref 3.5–5.1)
Sodium: 137 mmol/L (ref 135–145)

## 2023-10-18 LAB — GLUCOSE, CAPILLARY
Glucose-Capillary: 138 mg/dL — ABNORMAL HIGH (ref 70–99)
Glucose-Capillary: 232 mg/dL — ABNORMAL HIGH (ref 70–99)
Glucose-Capillary: 180 mg/dL — ABNORMAL HIGH (ref 70–99)
Glucose-Capillary: 208 mg/dL — ABNORMAL HIGH (ref 70–99)

## 2023-10-18 LAB — MAGNESIUM: Magnesium: 1.5 mg/dL — ABNORMAL LOW (ref 1.7–2.4)

## 2023-10-18 LAB — MRSA NEXT GEN BY PCR, NASAL: MRSA by PCR Next Gen: NOT DETECTED

## 2023-10-18 LAB — AMMONIA: Ammonia: 65 umol/L — ABNORMAL HIGH (ref 9–35)

## 2023-10-18 MED ORDER — MAGNESIUM OXIDE -MG SUPPLEMENT 400 (240 MG) MG PO TABS: 800.0000 mg | ORAL_TABLET | Freq: Two times a day (BID) | ORAL | Status: DC

## 2023-10-18 MED ORDER — SODIUM BICARBONATE 650 MG PO TABS
650.0000 mg | ORAL_TABLET | Freq: Two times a day (BID) | ORAL | Status: DC
  Administered 2023-10-18 – 2023-10-19 (×4): 650 mg via ORAL
  Filled 2023-10-18 (×4): qty 1

## 2023-10-18 MED ORDER — TERAZOSIN HCL 5 MG PO CAPS
5.0000 mg | ORAL_CAPSULE | Freq: Every day | ORAL | Status: DC
  Administered 2023-10-18 – 2023-11-07 (×21): 5 mg via ORAL
  Filled 2023-10-18 (×25): qty 1

## 2023-10-18 MED ORDER — MAGNESIUM SULFATE 4 GM/100ML IV SOLN
4.0000 g | Freq: Once | INTRAVENOUS | Status: AC
  Administered 2023-10-18: 4 g via INTRAVENOUS
  Filled 2023-10-18: qty 100

## 2023-10-18 MED ORDER — DEXTROSE IN LACTATED RINGERS 5 % IV SOLN: INTRAVENOUS | Status: DC

## 2023-10-18 NOTE — Evaluation (Signed)
Occupational Therapy Evaluation Patient Details Name: Joshua Berry MRN: [id number] DOB: 1946-09-19 Today's Date: 10/18/2023   History of Present Illness Joshua Berry is a 77 year old male with history of liver cirrhosis, hypertension, history of GI bleed, thrombocytopenia, insulin-dependent diabetes mellitus, GERD, and bilateral subdural hematomas with L midline shift treated with bilateral frontal burr holes on 07/14/23. Pt presents to emergency department for chief concerns of altered mental status.   Clinical Impression   Joshua Berry was seen for OT evaluation this date. Pt recently d/c from hospital to STR, states was working on walking with RW. Pt currently requires MIN A exit bed. MIN A + RW for BSC t/f and MAX A pericare in standing. Condom catheter dislodged and NT in to re-apply. Pt requesting to sit in chair for breakfast and defers further mobility. RN notified pt c/o 8/10 R shoulder pain at rest, start and end of session. Pt would benefit from skilled OT to address noted impairments and functional limitations (see below for any additional details). Upon hospital discharge, recommend OT follow up.     If plan is discharge home, recommend the following: A lot of help with bathing/dressing/bathroom;Help with stairs or ramp for entrance;A lot of help with walking and/or transfers    Functional Status Assessment  Patient has had a recent decline in their functional status and demonstrates the ability to make significant improvements in function in a reasonable and predictable amount of time.  Equipment Recommendations  Other (comment) (defer)    Recommendations for Other Services       Precautions / Restrictions Precautions Precautions: Fall Restrictions Weight Bearing Restrictions: No      Mobility Bed Mobility Overal bed mobility: Needs Assistance Bed Mobility: Supine to Sit     Supine to sit: Min assist          Transfers Overall transfer level: Needs  assistance Equipment used: Rolling walker (2 wheels) Transfers: Sit to/from Stand Sit to Stand: Min assist           General transfer comment: assist for lift off      Balance Overall balance assessment: Needs assistance Sitting-balance support: Feet supported Sitting balance-Leahy Scale: Good     Standing balance support: Bilateral upper extremity supported, Reliant on assistive device for balance Standing balance-Leahy Scale: Good                             ADL either performed or assessed with clinical judgement   ADL Overall ADL's : Needs assistance/impaired                                       General ADL Comments: MAX A don B socks in sitting. MIN A + RW for simulated BSC t/f and MAX Apericare in standing      Pertinent Vitals/Pain Pain Assessment Pain Assessment: 0-10 Pain Score: 8  Pain Location: R shoulder Pain Descriptors / Indicators: Discomfort, Grimacing Pain Intervention(s): Limited activity within patient's tolerance, Repositioned, Patient requesting pain meds-RN notified     Extremity/Trunk Assessment Upper Extremity Assessment Upper Extremity Assessment: Generalized weakness   Lower Extremity Assessment Lower Extremity Assessment: Generalized weakness       Communication Communication Communication: No apparent difficulties   Cognition Arousal: Alert Behavior During Therapy: WFL for tasks assessed/performed Overall Cognitive Status: History of cognitive impairments - at baseline  General Comments: slow processing, poor STM                Home Living Family/patient expects to be discharged to:: Skilled nursing facility                                 Additional Comments: Per chart at last admissin wife reported they live at ALF [facility]). Typically, she and pt go down to dining hall for meals      Prior Functioning/Environment                Mobility Comments: states at SNF was walking up to 70 ft ADLs Comments: MOD I back in July prior to SDH, then primarily w/c with PT working on return to walking        OT Problem List: Decreased strength;Decreased range of motion;Decreased activity tolerance;Impaired balance (sitting and/or standing);Decreased safety awareness      OT Treatment/Interventions: Self-care/ADL training;Therapeutic exercise;Energy conservation;DME and/or AE instruction;Therapeutic activities;Balance training;Patient/family education    OT Goals(Current goals can be found in the care plan section) Acute Rehab OT Goals Patient Stated Goal: to return to PLOF OT Goal Formulation: With patient Time For Goal Achievement: 11/01/23 Potential to Achieve Goals: Good ADL Goals Pt Will Perform Grooming: with set-up;with supervision;standing Pt Will Perform Lower Body Dressing: with contact guard assist;sit to/from stand;with adaptive equipment Pt Will Transfer to Toilet: with modified independence;ambulating;regular height toilet  OT Frequency: Min 1X/week    Co-evaluation              AM-PAC OT "6 Clicks" Daily Activity     Outcome Measure Help from another person eating meals?: None Help from another person taking care of personal grooming?: A Little Help from another person toileting, which includes using toliet, bedpan, or urinal?: A Lot Help from another person bathing (including washing, rinsing, drying)?: A Lot Help from another person to put on and taking off regular upper body clothing?: A Little Help from another person to put on and taking off regular lower body clothing?: A Lot 6 Click Score: 16   End of Session Equipment Utilized During Treatment: Rolling walker (2 wheels);Gait belt Nurse Communication: Mobility status;Patient requests pain meds  Activity Tolerance: Patient tolerated treatment well Patient left: in chair;with call bell/phone within reach;with chair alarm set  OT Visit  Diagnosis: Other abnormalities of gait and mobility (R26.89);Muscle weakness (generalized) (M62.81)                Time: 5784-6962 OT Time Calculation (min): 20 min Charges:  OT General Charges $OT Visit: 1 Visit OT Evaluation $OT Eval Low Complexity: 1 Low  Kathie Dike, M.S. OTR/L  10/18/23, 9:07 AM  ascom [phone]

## 2023-10-18 NOTE — Progress Notes (Signed)
  Progress Note   [name] ZOX:[id number] DOB: 02-Jan-1946 DOA: 10/16/2023     1 DOS: the patient was seen and examined on 10/18/2023   Brief hospital course: [name] is a 77 year old male with history of liver cirrhosis, hypertension, history of GI bleed, thrombocytopenia, insulin-dependent diabetes mellitus, GERD, who presents emergency department for chief concerns of altered mental status. Ammonia is 89.  Magnesium is 1.5.  CT head showed Mix density subdural hematomas overlying bilateral cerebral hemispheres.  These hematomas have not significantly changed in size since CT head of 09/15/2023.  However there are hyperdense components within both subdural hematoma suggesting interval hemorrhage into these collections.  Balance mass effect upon the underlying cerebral hemispheres within midline shift. Repeated CT head after 6 hours did not show any changes. Patient is seen by neurosurgery, no need for surgery intervention. Patient was also given lactulose, mental status improving.    Principal Problem:   Subdural hematoma (HCC) Active Problems:   Hepatic encephalopathy (HCC)   Pancytopenia (HCC)   HLD (hyperlipidemia)   GERD without esophagitis   Hypothyroidism   Secondary esophageal varices without bleeding (HCC)   Physical deconditioning   Hypomagnesemia   Intracranial hemorrhage (HCC)   Assessment and Plan: Intracranial hemorrhage. Neurosurgery specialist has been consulted and recommends repeat CT of the head without contrast in 6 hours showed no change. I have discussed with Dr. Madaline Brilliant, no surgery intervention.   Hepatic encephalopathy (HCC) Ammonia elevated on admission at 89 Patient was resumed on  lactulose and rifaximin, with larger dose increased to 4 times a day.  Condition improved today, patient medically stable for discharge, but need recertification from insurance company.  Most likely will discharge on Monday.   GERD without  esophagitis Continue Protonix.   Hypothyroidism Home levothyroxine 100 mcg daily resumed   Hypomagnesemia Minimal metabolic acidosis. Give another dose of 4 g magnesium sulfate.   Physical deconditioning Fall precaution   Secondary esophageal varices without bleeding (HCC) Secondary to [name]      Subjective:  Patient much improved today, no confusion.  Had multiple bowel movement.  Physical Exam: Vitals:   10/17/23 2035 10/17/23 2346 10/18/23 0440 10/18/23 1242  BP: (!) 125/58 (!) 114/59 132/72 (!) 110/54  Pulse: 71 (!) 56 62 61  Resp: 20 20 20 16   Temp: 98.8 F (37.1 C) 98.2 F (36.8 C) 98 F (36.7 C) 98.1 F (36.7 C)  TempSrc:   Oral   SpO2: 98% 98% 100% 99%  Weight: 98.1 kg     Height: 6\' 2"  (1.88 m)      General exam: Appears calm and comfortable  Respiratory system: Clear to auscultation. Respiratory effort normal. Cardiovascular system: S1 & S2 heard, RRR. No JVD, murmurs, rubs, gallops or clicks. No pedal edema. Gastrointestinal system: Abdomen is nondistended, soft and nontender. No organomegaly or masses felt. Normal bowel sounds heard. Central nervous system: Alert and oriented. No focal neurological deficits. Extremities: Symmetric 5 x 5 power. Skin: No rashes, lesions or ulcers Psychiatry: Judgement and insight appear normal. Mood & affect appropriate.    Data Reviewed:  Lab results reviewed.  Family Communication: Daughter-in-law updated at bedside.  Disposition: Status is: Observation      Time spent: 35 minutes  Author: Marrion Coy, MD 10/18/2023 1:01 PM  For on call review [email or web address].

## 2023-10-18 NOTE — Progress Notes (Signed)
Per family reports pt cannot chew hard food. NP Morrsion made aware. Will continue to monitor.

## 2023-10-18 NOTE — Progress Notes (Signed)
Neurosurgery visit note Patient seen and examined.  He is sitting in a chair eating his breakfast.  He denies any headaches or otherwise pain.  He continues to be closely followed by the hospitalist service here for which we appreciate their excellent care and being worked up further for his hepatic encephalopathy.  On physical exam he was awake answer questions appropriately to prompting including his name location including hospital as well as 2024.  His cranial nerves appear grossly intact.  He is moving all 4 extremities grossly full strength and sensation to prompting.  He is nontoxic-appearing  AP: Overall I am encouraged by his neurologic state from subdural standpoint reassured by his stable cranial imaging.  I suspect any ongoing alterations in his mental status or slowness to respond for a result of his metabolic derangements and not because of the subdural hematomas.  Neurosurgery will continue to follow and appreciate the excellent care of the hospitalist service  Melina Modena MD Neurosurgery

## 2023-10-18 NOTE — TOC Progression Note (Signed)
Transition of Care [facility]) - Progression Note    Patient Details  Name: Joshua Berry MRN: [id number] Date of Birth: 1946-01-28  [facility] of [facility]) CM/SW Contact  Bing Quarry, RN Phone Number: 10/18/2023, 1:51 PM  Clinical Narrative:  11/2: Per AC at [facility], patient needs new insurance authorization from the [place]. Information sent via email at [email or web address]   Gabriel Cirri MSN RN CM  Care Management Department.  [place]  [facility] Direct Dial: [phone] Main Office Phone: [phone] Weekends Only           Expected Discharge Plan and Services                                               Social Determinants of Health (SDOH) Interventions SDOH Screenings   Food Insecurity: Patient Declined (10/17/2023)  Housing: Patient Declined (10/17/2023)  Transportation Needs: Patient Declined (10/17/2023)  Utilities: Patient Declined (10/17/2023)  Depression (PHQ2-9): Low Risk  (04/24/2023)  Financial Resource Strain: Low Risk  (08/06/2023)   Received from [facility]  Social Connections: Unknown (04/29/2022)   Received from [facility]  Tobacco Use: Medium Risk (10/17/2023)    Readmission Risk Interventions    09/15/2023    8:54 AM 08/14/2023    2:18 PM 07/15/2023    8:52 AM  Readmission Risk Prevention Plan  Transportation Screening Complete Complete Complete  PCP or Specialist Appt within 3-5 Days Complete Complete Complete  HRI or Home Care Consult Complete Complete   Social Work Consult for Recovery Care Planning/Counseling Not Complete Complete Complete  Palliative Care Screening Not Applicable Not Applicable Not Applicable  Medication Review Oceanographer) Not Complete Complete Referral to Pharmacy  Med Review Comments Patient confused and disoriented.

## 2023-10-18 NOTE — Evaluation (Signed)
Physical Therapy Evaluation Patient Details Name: Joshua Berry MRN: [id number] DOB: June 07, 1946 Today's Date: 10/18/2023  History of Present Illness  [name] is a 77 year old male with history of liver cirrhosis, hypertension, history of GI bleed, thrombocytopenia, insulin-dependent diabetes mellitus, GERD, who presents emergency department for chief concerns of altered mental status. MD assessment includes ICH, hepatic encephalopathy, hypothyroidism, hypomagnesemia, and physical deconditioning.  Clinical Impression  Pt was pleasant and motivated to participate during the session and put forth good effort throughout. Received subjective info from combination of pt and pt daughter-in-law. Pt presented oriented to name and DOB, following commands without extra time. Pt required physical assistance to perform transfers in/out of chair, and CGA for amb requiring cuing for improved gait efficiency and safety. Pt tolerated session well, reported no adverse symptoms throughout, on RA. Pt will benefit from continued PT services upon discharge to safely address deficits listed in patient problem list for decreased caregiver assistance and eventual return to PLOF.       If plan is discharge home, recommend the following: A little help with walking and/or transfers;A little help with bathing/dressing/bathroom;Assistance with cooking/housework;Assist for transportation   Can travel by private vehicle   No    Equipment Recommendations None recommended by PT  Recommendations for Other Services       Functional Status Assessment Patient has had a recent decline in their functional status and demonstrates the ability to make significant improvements in function in a reasonable and predictable amount of time.     Precautions / Restrictions Precautions Precautions: Fall Restrictions Weight Bearing Restrictions: No      Mobility  Bed Mobility               General bed mobility comments:  n/a pt found in recliner Patient Response: Cooperative  Transfers Overall transfer level: Needs assistance Equipment used: Rolling walker (2 wheels) Transfers: Sit to/from Stand Sit to Stand: Min assist           General transfer comment: pt performed STS x3, req cuing for hand placement    Ambulation/Gait Ambulation/Gait assistance: Contact guard assist Gait Distance (Feet): 8 Feet x1, 16 Feet x2 Assistive device: Rolling walker (2 wheels) Gait Pattern/deviations: Decreased stride length, Decreased step length - right, Decreased step length - left, Step-through pattern, Trunk flexed, Narrow base of support Gait velocity: decreased     General Gait Details: very slow cadence, with cuing pt able to correct narrow BOS  Stairs            Wheelchair Mobility     Tilt Bed Tilt Bed Patient Response: Cooperative  Modified Rankin (Stroke Patients Only)       Balance Overall balance assessment: Needs assistance Sitting-balance support: Feet supported, Bilateral upper extremity supported Sitting balance-Leahy Scale: Good     Standing balance support: Bilateral upper extremity supported, During functional activity, Reliant on assistive device for balance Standing balance-Leahy Scale: Fair Standing balance comment: heavily reliant on RW for stability                             Pertinent Vitals/Pain Pain Assessment Pain Assessment: No/denies pain    Home Living Family/patient expects to be discharged to:: Assisted living                 Home Equipment: Rollator (4 wheels);Rolling Walker (2 wheels);Wheelchair - manual Additional Comments: resident at [facility]F    Prior Function Prior Level of Function :  Needs assist             Mobility Comments: able to walk facility distances at ALF Mod I with rollator, 1 fall in last 6 mo ADLs Comments: mostly ind with ADLs receiving assistance from staff prn     Extremity/Trunk Assessment    Upper Extremity Assessment Upper Extremity Assessment: Overall WFL for tasks assessed    Lower Extremity Assessment Lower Extremity Assessment: Generalized weakness    Cervical / Trunk Assessment Cervical / Trunk Assessment: Kyphotic  Communication   Communication Communication: No apparent difficulties Cueing Techniques: Verbal cues;Tactile cues;Visual cues  Cognition Arousal: Alert Behavior During Therapy: WFL for tasks assessed/performed Overall Cognitive Status: Within Functional Limits for tasks assessed                                          General Comments      Exercises     Assessment/Plan    PT Assessment Patient needs continued PT services  PT Problem List Decreased strength;Decreased range of motion;Decreased activity tolerance;Decreased balance;Decreased mobility;Decreased coordination;Decreased knowledge of use of DME;Decreased safety awareness       PT Treatment Interventions DME instruction;Gait training;Functional mobility training;Therapeutic activities;Therapeutic exercise;Balance training;Patient/family education    PT Goals (Current goals can be found in the Care Plan section)  Acute Rehab PT Goals Patient Stated Goal: get stronger PT Goal Formulation: With patient Time For Goal Achievement: 10/31/23 Potential to Achieve Goals: Good    Frequency Min 1X/week     Co-evaluation               AM-PAC PT "6 Clicks" Mobility  Outcome Measure Help needed turning from your back to your side while in a flat bed without using bedrails?: A Little Help needed moving from lying on your back to sitting on the side of a flat bed without using bedrails?: A Little Help needed moving to and from a bed to a chair (including a wheelchair)?: A Little Help needed standing up from a chair using your arms (e.g., wheelchair or bedside chair)?: A Little Help needed to walk in hospital room?: A Little Help needed climbing 3-5 steps with a  railing? : A Lot 6 Click Score: 17    End of Session Equipment Utilized During Treatment: Gait belt Activity Tolerance: Patient tolerated treatment well Patient left: in chair;with call bell/phone within reach;with family/visitor present;with chair alarm set Nurse Communication: Mobility status PT Visit Diagnosis: Muscle weakness (generalized) (M62.81);Difficulty in walking, not elsewhere classified (R26.2)    Time: 1610-9604 (and 10:37 - 10:56 with break between for toiletting) PT Time Calculation (min) (ACUTE ONLY): 9 min   Charges:                Rosiland Oz SPT 10/18/23, 12:52 PM

## 2023-10-19 ENCOUNTER — Inpatient Hospital Stay: Payer: Medicare Other

## 2023-10-19 DIAGNOSIS — S065XAA Traumatic subdural hemorrhage with loss of consciousness status unknown, initial encounter: Secondary | ICD-10-CM | POA: Diagnosis not present

## 2023-10-19 DIAGNOSIS — K7682 Hepatic encephalopathy: Secondary | ICD-10-CM | POA: Diagnosis not present

## 2023-10-19 LAB — GLUCOSE, CAPILLARY
Glucose-Capillary: 150 mg/dL — ABNORMAL HIGH (ref 70–99)
Glucose-Capillary: 203 mg/dL — ABNORMAL HIGH (ref 70–99)
Glucose-Capillary: 263 mg/dL — ABNORMAL HIGH (ref 70–99)
Glucose-Capillary: 250 mg/dL — ABNORMAL HIGH (ref 70–99)
Glucose-Capillary: 283 mg/dL — ABNORMAL HIGH (ref 70–99)

## 2023-10-19 LAB — BASIC METABOLIC PANEL
Anion gap: 7 (ref 5–15)
BUN: 11 mg/dL (ref 8–23)
CO2: 21 mmol/L — ABNORMAL LOW (ref 22–32)
Calcium: 8.9 mg/dL (ref 8.9–10.3)
Chloride: 109 mmol/L (ref 98–111)
Creatinine, Ser: 0.76 mg/dL (ref 0.61–1.24)
GFR, Estimated: >60 mL/min (ref >60)
Glucose, Bld: 146 mg/dL — ABNORMAL HIGH (ref 70–99)
Potassium: 3.6 mmol/L (ref 3.5–5.1)
Sodium: 137 mmol/L (ref 135–145)

## 2023-10-19 LAB — MAGNESIUM: Magnesium: 1.6 mg/dL — ABNORMAL LOW (ref 1.7–2.4)

## 2023-10-19 LAB — AMMONIA: Ammonia: 95 umol/L — ABNORMAL HIGH (ref 9–35)

## 2023-10-19 MED ORDER — LACTATED RINGERS IV SOLN: INTRAVENOUS | Status: AC

## 2023-10-19 MED ORDER — MAGNESIUM SULFATE 4 GM/100ML IV SOLN
4.0000 g | Freq: Once | INTRAVENOUS | Status: AC
  Administered 2023-10-19: 4 g via INTRAVENOUS
  Filled 2023-10-19: qty 100

## 2023-10-19 MED ORDER — MAGNESIUM OXIDE 420 MG PO TABS: 840.0000 mg | ORAL_TABLET | Freq: Two times a day (BID) | ORAL | Status: DC

## 2023-10-19 NOTE — Progress Notes (Signed)
Progress Note   [name] UJW:[id number] DOB: 05-Apr-1946 DOA: 10/16/2023     1 DOS: the patient was seen and examined on 10/19/2023   Brief hospital course: [name] is a 77 year old male with history of liver cirrhosis, hypertension, history of GI bleed, thrombocytopenia, insulin-dependent diabetes mellitus, GERD, who presents emergency department for chief concerns of altered mental status. Ammonia is 89.  Magnesium is 1.5.  CT head showed Mix density subdural hematomas overlying bilateral cerebral hemispheres.  These hematomas have not significantly changed in size since CT head of 09/15/2023.  However there are hyperdense components within both subdural hematoma suggesting interval hemorrhage into these collections.  Balance mass effect upon the underlying cerebral hemispheres within midline shift. Repeated CT head after 6 hours did not show any changes. Patient is seen by neurosurgery, no need for surgery intervention. Patient was also given lactulose.    Principal Problem:   Subdural hematoma (HCC) Active Problems:   Hepatic encephalopathy (HCC)   Pancytopenia (HCC)   HLD (hyperlipidemia)   GERD without esophagitis   Hypothyroidism   Secondary esophageal varices without bleeding (HCC)   Physical deconditioning   Hypomagnesemia   Intracranial hemorrhage (HCC)   Assessment and Plan: Intracranial hemorrhage. Neurosurgery specialist has been consulted and recommends repeat CT of the head without contrast in 6 hours showed no change. I have discussed with Dr. Madaline Berry, no surgery intervention. 11/3.  Patient had more confusion today, repeated CT scan.   Hepatic encephalopathy (HCC) Liver cirrhosis. Ammonia elevated on admission at 89 Patient was resumed on  lactulose and rifaximin, with larger dose increased to 4 times a day.   Patient had several bowel movement yesterday, but ammonia level went up again today, patient also had more confusion today.  Will  continue increased dose of lactulose, rifaximin.  I will also start some fluids.   GERD without esophagitis Continue Protonix.   Hypothyroidism Home levothyroxine 100 mcg daily resumed   Hypomagnesemia Minimal metabolic acidosis. Patient appears to have refractory hypomagnesemia, will give another dose of magnesium sulfate at 4 g.   Physical deconditioning Fall precaution   Secondary esophageal varices without bleeding (HCC) Secondary to Joshua Berry          Subjective:  Patient has some confusion today, had  several loose stools yesterday with a large stool I witnessed.  No nausea vomiting.  Physical Exam: Vitals:   10/18/23 2100 10/19/23 0021 10/19/23 0400 10/19/23 0740  BP: (!) 112/58 (!) 108/57 (!) 109/55 (!) 124/54  Pulse: 70 66  (!) 40  Resp:    15  Temp: 98.8 F (37.1 C) 97.8 F (36.6 C) 98.5 F (36.9 C) 98.9 F (37.2 C)  TempSrc: Oral Oral Oral Oral  SpO2: 98% 99% 99% 100%  Weight:      Height:       General exam: Appears calm and comfortable  Respiratory system: Clear to auscultation. Respiratory effort normal. Cardiovascular system: S1 & S2 heard, RRR. No JVD, murmurs, rubs, gallops or clicks. No pedal edema. Gastrointestinal system: Abdomen is nondistended, soft and nontender. No organomegaly or masses felt. Normal bowel sounds heard. Central nervous system: Alert and oriented. No focal neurological deficits. Extremities: Symmetric 5 x 5 power. Skin: No rashes, lesions or ulcers Psychiatry: Judgement and insight appear normal. Mood & affect appropriate.    Data Reviewed:  Lab results reviewed. Family Communication: Son and Daughter-in-law updated over the phone.  Disposition: Status is: Inpatient Remains inpatient appropriate because: Severity of disease, altered mental status.  Time spent: 35 minutes  Author: Marrion Coy, MD 10/19/2023 11:54 AM  For on call review [email or web address].

## 2023-10-20 DIAGNOSIS — S065XAA Traumatic subdural hemorrhage with loss of consciousness status unknown, initial encounter: Secondary | ICD-10-CM | POA: Diagnosis not present

## 2023-10-20 DIAGNOSIS — D61818 Other pancytopenia: Secondary | ICD-10-CM | POA: Diagnosis not present

## 2023-10-20 DIAGNOSIS — K7682 Hepatic encephalopathy: Secondary | ICD-10-CM | POA: Diagnosis not present

## 2023-10-20 DIAGNOSIS — Z7189 Other specified counseling: Secondary | ICD-10-CM

## 2023-10-20 LAB — BASIC METABOLIC PANEL
Anion gap: 6 (ref 5–15)
BUN: 13 mg/dL (ref 8–23)
CO2: 23 mmol/L (ref 22–32)
Calcium: 8.6 mg/dL — ABNORMAL LOW (ref 8.9–10.3)
Chloride: 106 mmol/L (ref 98–111)
Creatinine, Ser: 0.77 mg/dL (ref 0.61–1.24)
GFR, Estimated: >60 mL/min (ref >60)
Glucose, Bld: 159 mg/dL — ABNORMAL HIGH (ref 70–99)
Potassium: 3.5 mmol/L (ref 3.5–5.1)
Sodium: 135 mmol/L (ref 135–145)

## 2023-10-20 LAB — GLUCOSE, CAPILLARY
Glucose-Capillary: 143 mg/dL — ABNORMAL HIGH (ref 70–99)
Glucose-Capillary: 239 mg/dL — ABNORMAL HIGH (ref 70–99)
Glucose-Capillary: 227 mg/dL — ABNORMAL HIGH (ref 70–99)
Glucose-Capillary: 234 mg/dL — ABNORMAL HIGH (ref 70–99)

## 2023-10-20 LAB — CBC
HCT: 28.3 % — ABNORMAL LOW (ref 39.0–52.0)
Hemoglobin: 10 g/dL — ABNORMAL LOW (ref 13.0–17.0)
MCH: 31.6 pg (ref 26.0–34.0)
MCHC: 35.3 g/dL (ref 30.0–36.0)
MCV: 89.6 fL (ref 80.0–100.0)
Platelets: 46 10*3/uL — ABNORMAL LOW (ref 150–400)
RBC: 3.16 MIL/uL — ABNORMAL LOW (ref 4.22–5.81)
RDW: 15.3 % (ref 11.5–15.5)
WBC: 3.1 10*3/uL — ABNORMAL LOW (ref 4.0–10.5)
nRBC: 0 % (ref 0.0–0.2)

## 2023-10-20 LAB — PROTIME-INR
INR: 1.4 — ABNORMAL HIGH (ref 0.8–1.2)
Prothrombin Time: 17.2 s — ABNORMAL HIGH (ref 11.4–15.2)

## 2023-10-20 LAB — MAGNESIUM: Magnesium: 1.8 mg/dL (ref 1.7–2.4)

## 2023-10-20 LAB — AMMONIA: Ammonia: 79 umol/L — ABNORMAL HIGH (ref 9–35)

## 2023-10-20 LAB — PHOSPHORUS: Phosphorus: 3.6 mg/dL (ref 2.5–4.6)

## 2023-10-20 MED ORDER — POTASSIUM CHLORIDE CRYS ER 20 MEQ PO TBCR
40.0000 meq | EXTENDED_RELEASE_TABLET | Freq: Once | ORAL | Status: AC
  Administered 2023-10-20: 40 meq via ORAL
  Filled 2023-10-20: qty 2

## 2023-10-20 MED ORDER — FLEET ENEMA RE ENEM
1.0000 | ENEMA | Freq: Once | RECTAL | Status: AC
  Administered 2023-10-20: 1 via RECTAL

## 2023-10-20 MED ORDER — POLYETHYLENE GLYCOL 3350 17 G PO PACK
17.0000 g | PACK | Freq: Once | ORAL | Status: AC
  Administered 2023-10-20: 17 g via ORAL
  Filled 2023-10-20: qty 1

## 2023-10-20 MED ORDER — LACTATED RINGERS IV SOLN: INTRAVENOUS | Status: AC

## 2023-10-20 NOTE — Consult Note (Signed)
Consultation Note Date: 10/20/2023   Patient Name: Joshua Berry  DOB: 15-Jan-1946  MRN: [id number]  Age / Sex: 77 y.o., male  PCP: [facility] Referring Physician: Marrion Coy, MD  Reason for Consultation: Establishing goals of care  HPI/Patient Profile: Per H&P, Joshua Berry is a 77 year old male with history of liver cirrhosis, hypertension, history of GI bleed, thrombocytopenia, insulin-dependent diabetes mellitus, GERD, who presents emergency department for chief concerns of altered mental status. Ammonia is 89.  Magnesium is 1.5.CT head showed Mix density subdural hematomas overlying bilateral cerebral hemispheres.  These hematomas have not significantly changed in size since CT head of 09/15/2023.  However there are hyperdense components within both subdural hematoma suggesting interval hemorrhage into these collections.  Balance mass effect upon the underlying cerebral hemispheres within midline shift. Repeated CT head after 6 hours did not show any changes. Patient is seen by neurosurgery, no need for surgery intervention.   Clinical Assessment and Goals of Care: Notes and labs reviewed.  Into see patient.  Currently he is sitting in bed at this time.  Patient's lunch is sitting at bedside and he is eating all of it.  He discusses his SDH, and states he is still having difficulty with thinking and processing, but states he feels he is doing better.  Patient states he is from [place].  He states he is married and has 2 children.  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  Patient has no complaints at this time.  PMT will continue to follow.  SUMMARY OF RECOMMENDATIONS   PMT will follow.  Prognosis:  Unable to determine       Primary Diagnoses: Present on Admission:  Subdural hematoma (HCC)  Secondary esophageal varices without bleeding (HCC)  Physical deconditioning   Hypothyroidism  HLD (hyperlipidemia)  Hepatic encephalopathy (HCC)  GERD without esophagitis  Hypomagnesemia  Pancytopenia (HCC)   I have reviewed the medical record, interviewed the patient and family, and examined the patient. The following aspects are pertinent.  Past Medical History:  Diagnosis Date   Arthritis    Bleeding ulcer    Cancer (HCC)    melanoma, carcinoma   Cirrhosis of liver (HCC)    Diabetes mellitus without complication (HCC)    Fall    Heart murmur    Hypertension    Iron deficiency anemia    NASH (nonalcoholic steatohepatitis)    Sleep apnea    Spleen enlarged    Thrombocytopenia (HCC)    Varices, esophageal (HCC)    and gastric per spouse   Social History   Socioeconomic History   Marital status: Married    Spouse name: Joshua Berry   Number of children: Not on file   Years of education: Not on file   Highest education level: Not on file  Occupational History   Not on file  Tobacco Use   Smoking status: Former    Current packs/day: 0.00    Average packs/day: 0.5 packs/day for 5.0 years (2.5 ttl pk-yrs)  Types: Cigarettes    Start date: 10/17/1963    Quit date: 10/16/1968    Years since quitting: 55.0   Smokeless tobacco: Never  Vaping Use   Vaping status: Never Used  Substance and Sexual Activity   Alcohol use: Not Currently   Drug use: Not Currently   Sexual activity: Not Currently  Other Topics Concern   Not on file  Social History Narrative   Not on file   Social Determinants of Health   Financial Resource Strain: Low Risk  (08/06/2023)   [facility] from [facility]   Overall Financial Resource Strain (CARDIA)    Difficulty of Paying Living Expenses: Not hard at all  Food Insecurity: Patient Declined (10/17/2023)   Hunger Vital Sign    Worried About Running Out of Food in the Last Year: Patient declined    Ran Out of Food in the Last Year: Patient declined  Transportation Needs: Patient Declined (10/17/2023)   PRAPARE -  Administrator, Civil Service (Medical): Patient declined    Lack of Transportation (Non-Medical): Patient declined  Physical Activity: Not on file  Stress: Not on file  Social Connections: Unknown (04/29/2022)   Received from [employer], [facility]   Social Network    Social Network: Not on file   Family History  Problem Relation Age of Onset   Heart attack Brother    Scheduled Meds:  acidophilus  1 capsule Oral Daily   insulin aspart  0-5 Units Subcutaneous QHS   insulin aspart  0-9 Units Subcutaneous TID WC   insulin glargine-yfgn  10 Units Subcutaneous QHS   lactulose  30 g Oral QID   levothyroxine  100 mcg Oral Q0600   melatonin  5 mg Oral QHS   pantoprazole  40 mg Oral BID   rifaximin  550 mg Oral BID   terazosin  5 mg Oral QHS   Continuous Infusions:  lactated ringers 50 mL/hr at 10/20/23 0856   PRN Meds:.acetaminophen **OR** acetaminophen, hydrALAZINE, ondansetron **OR** ondansetron (ZOFRAN) IV, senna-docusate Medications Prior to Admission:  Prior to Admission medications   Medication Sig Start Date End Date Taking? Authorizing Provider  atorvastatin (LIPITOR) 20 MG tablet Take 20 mg by mouth at bedtime.   Yes [provider]  butalbital-acetaminophen-caffeine (FIORICET) 50-325-40 MG tablet Take 1 tablet by mouth every 8 (eight) hours as needed for headache. 08/19/23  Yes Alford Highland, MD  Cholecalciferol 50 MCG (2000 UT) TABS Take 1 tablet by mouth daily.   Yes [provider]  clotrimazole (LOTRIMIN) 1 % external solution Apply 1 Application topically 2 (two) times daily. Apply to toe nails 02/12/23  Yes [provider]  Emollient (GOLD BOND ULTIMATE) LOTN Apply 1 Application topically in the morning and at bedtime. Apply to bilateral lower legs for dry skin 10/13/23  Yes [provider]  famotidine (PEPCID) 20 MG tablet Take 20 mg by mouth daily.   Yes [provider]  furosemide (LASIX) 20 MG tablet Take  1 tablet (20 mg total) by mouth daily. 04/07/22  Yes Arnetha Courser, MD  insulin aspart (NOVOLOG) 100 UNIT/ML injection Inject 5 Units into the skin 3 (three) times daily before meals. 08/19/23  Yes Wieting, Richard, MD  insulin glargine (LANTUS) 100 UNIT/ML injection Inject 0.1 mLs (10 Units total) into the skin at bedtime. 08/19/23  Yes Wieting, Richard, MD  ketotifen (ZADITOR) 0.025 % ophthalmic solution Place 1 drop into both eyes 2 (two) times daily.   Yes [provider]  Lactobacillus Rhamnosus, GG, (RA PROBIOTIC DIGESTIVE CARE) CAPS Take 1 capsule by mouth daily.   Yes [provider]  lactulose (CHRONULAC) 10 GM/15ML solution Take 45 mLs (30 g total) by mouth 3 (three) times daily. 09/25/23  Yes Loyce Dys, MD  levothyroxine (SYNTHROID) 100 MCG tablet Take 100 mcg by mouth daily before breakfast.   Yes [provider]  lidocaine (LIDODERM) 5 % Place 2 patches onto the skin daily. Remove & Discard patch within 12 hours or as directed by MD 05/28/22  Yes [name], MD  magnesium oxide (MAG-OX) 400 (240 Mg) MG tablet Take 420 mg by mouth 2 (two) times daily. 2 tablets twice a day (420 mg tablet)   Yes [provider]  Magnesium Oxide 420 MG TABS Take 2 tablets (840 mg total) by mouth 2 (two) times daily. 10/19/23  Yes Marrion Coy, MD  melatonin 3 MG TABS tablet Take 6 mg by mouth at bedtime.   Yes [provider]  Multiple Vitamin (MULTIVITAMIN WITH MINERALS) TABS tablet Take 1 tablet by mouth daily. 08/01/23  Yes Gillis Santa, MD  Omega-3 Fatty Acids (FISH OIL) 1000 MG CAPS Take 1 capsule by mouth 2 (two) times daily.   Yes [provider]  pantoprazole (PROTONIX) 40 MG tablet Take 1 tablet (40 mg total) by mouth 2 (two) times daily. 09/25/23 11/24/23 Yes Djan, Scarlette Calico, MD  polyethylene glycol (MIRALAX / GLYCOLAX) 17 g packet Take 17 g by mouth daily as needed for mild constipation.   Yes [provider]  rifaximin (XIFAXAN) 550 MG  TABS tablet Take 550 mg by mouth 2 (two) times daily. Takes with lactulose   Yes [provider]  terazosin (HYTRIN) 5 MG capsule Take 5 mg by mouth at bedtime.   Yes [provider]  thiamine (VITAMIN B-1) 100 MG tablet Take 1 tablet (100 mg total) by mouth daily. 09/26/23  Yes Loyce Dys, MD  acetaminophen (TYLENOL) 500 MG tablet Take 1 tablet (500 mg total) by mouth every 6 (six) hours as needed for moderate pain, fever, headache or mild pain (headache). 07/31/23   Gillis Santa, MD  feeding supplement (ENSURE ENLIVE / ENSURE PLUS) LIQD Take 237 mLs by mouth 3 (three) times daily between meals. 07/31/23   Gillis Santa, MD  trolamine salicylate (ASPERCREME) 10 % cream Apply topically as needed for muscle pain. Patient not taking: Reported on 10/16/2023 05/28/22   [name], MD  zinc oxide 20 % ointment Apply 1 Application topically in the morning and at bedtime. APPLY SMALL AMOUNT TO AFFECTED AREA TWICE A DAY FOR BUTTOCK RASH FOR BUTTOCK RASH Patient not taking: Reported on 10/16/2023 05/13/23   [provider]   Allergies  Allergen Reactions   Camphor Swelling and Rash   Latex Rash   Lisinopril Cough    Other Reaction(s): Cough   Sulfamethoxazole Swelling   Chocolate     Family states he is allergic   Nsaids Other (See Comments)    NASH   Review of Systems  All other systems reviewed and are negative.   Physical Exam Pulmonary:     Effort: Pulmonary effort is normal.  Neurological:     Mental Status: He is alert.     Vital Signs: BP (!) 111/55 (BP Location: Left Arm)   Pulse 69   Temp 98.3 F (36.8 C)   Resp 16   Ht 6\' 2"  (1.88 m)   Wt 98.1 kg   SpO2 99%   BMI  27.76 kg/m  Pain Scale: 0-10   Pain Score: 0-No pain   SpO2: SpO2: 99 % O2 Device:SpO2: 99 % O2 Flow Rate: .O2 Flow Rate (L/min): 0 L/min  IO: Intake/output summary:  Intake/Output Summary (Last 24 hours) at 10/20/2023 1620 Last data filed at 10/20/2023 0242 Gross per 24 hour   Intake --  Output 550 ml  Net -550 ml    LBM: Last BM Date : 10/19/23 Baseline Weight: Weight: 98.1 kg Most recent weight: Weight: 98.1 kg          Signed by: Morton Stall, NP   Please contact Palliative Medicine Team phone at [phone] for questions and concerns.  For individual provider: See Loretha Stapler

## 2023-10-20 NOTE — [place] FL2 (Signed)
[place] MEDICAID FL2 LEVEL OF CARE FORM     IDENTIFICATION  Patient Name: Joshua Berry Birthdate: 01-13-1946 Sex: male Admission Date (Current Location): 10/16/2023  [place] and [place] Number:  Chiropodist and Address:  [facility], [address], [place], [place] [address]      Provider Number: [id number]  Attending Physician Name and Address:  Marrion Coy, MD  Relative Name and Phone Number:       Current Level of Care: Hospital Recommended Level of Care: Skilled Nursing Facility Prior Approval Number:    Date Approved/Denied:   PASRR Number: [id number]  Discharge Plan: SNF    Current Diagnoses: Patient Active Problem List   Diagnosis Date Noted   Intracranial hemorrhage (HCC) 10/17/2023   Encephalopathy 09/15/2023   Right wrist pain 08/14/2023   Bilateral subdural hematomas (HCC) 08/13/2023   Malnutrition of moderate degree 08/13/2023   Decubitus ulcer of coccyx, stage I 08/13/2023   Acute metabolic encephalopathy 08/12/2023   Staphylococcus epidermidis bacteremia 07/23/2023   Acute nontraumatic intracranial subdural hematoma (HCC) 07/15/2023   Subdural hematoma (HCC) 07/14/2023   Compression of brain (HCC) 07/14/2023   Uncal herniation (HCC) 07/14/2023   Type 2 diabetes mellitus without complications (HCC) 06/17/2023   GERD without esophagitis 06/17/2023   Dyslipidemia 06/17/2023   Pressure injury of skin 06/17/2023   Acute urinary retention 04/11/2023   Streptococcal bacteremia 04/07/2023   Altered mental status 04/06/2023   Hepatic encephalopathy (HCC) 04/05/2023   Overweight (BMI 25.0-29.9) 08/03/2022   Hyponatremia, hypokalemia and hypomagnesemia 05/18/2022   Hyponatremia 05/18/2022   Hypokalemia 05/18/2022   Back pain 05/17/2022   Hypomagnesemia 05/17/2022   Acute hepatic encephalopathy (HCC) 05/17/2022   AKI (acute kidney injury) (HCC) 05/16/2022   Elevated liver enzymes 05/16/2022   Hypotension  05/16/2022   Right shoulder pain 05/16/2022   Hypothyroidism 05/16/2022   Physical deconditioning 05/16/2022   Obesity (BMI 30-39.9) 05/16/2022   Lactic acidosis 05/16/2022   Fall 05/16/2022   Septic shock due to Staphylococcus bacteremia 05/14/2022   Chest pain 04/06/2022   Controlled IDDM-2 with hyperglycemia 04/06/2022   HLD (hyperlipidemia) 04/06/2022   Chronic diastolic CHF (congestive heart failure) (HCC) 04/06/2022   Pancytopenia (HCC) 04/06/2022   GI bleeding 04/24/2021   Cirrhosis of liver without ascites (HCC)    Secondary esophageal varices without bleeding (HCC)    Portal hypertension (HCC)    Stomach irritation    Melena    Acute gastrointestinal hemorrhage 07/19/2019   Severe sepsis (HCC) 12/20/2018    Orientation RESPIRATION BLADDER Height & Weight     Self, Time, Situation, Place  Normal Continent Weight: 216 lb 3.2 oz (98.1 kg) Height:  6\' 2"  (188 cm)  BEHAVIORAL SYMPTOMS/MOOD NEUROLOGICAL BOWEL NUTRITION STATUS   (None)  (None) Continent Diet (Heart healthy. Soft foods.)  AMBULATORY STATUS COMMUNICATION OF NEEDS Skin   Limited Assist Verbally Skin abrasions, Bruising, Other (Comment), PU Stage and Appropriate Care (Erythema/redness, rash.)   PU Stage 2 Dressing:  (Mid coccyx: Foam prn.)                   Personal Care Assistance Level of Assistance  Bathing, Feeding, Dressing Bathing Assistance: Maximum assistance Feeding assistance: Limited assistance Dressing Assistance: Maximum assistance     Functional Limitations Info  Sight, Hearing, Speech Sight Info: Adequate Hearing Info: Adequate Speech Info: Adequate    SPECIAL CARE FACTORS FREQUENCY  PT (By licensed PT), OT (By licensed OT)     PT Frequency: 5  x week OT Frequency: 5 x week            Contractures Contractures Info: Not present    Additional Factors Info  Code Status, Allergies Code Status Info: DNR Allergies Info: Camphor, Latex, Lisinopril, Sulfamethoxazole, Chocolate,  Nsaids           Current Medications (10/20/2023):  This is the current hospital active medication list Current Facility-Administered Medications  Medication Dose Route Frequency Provider Last Rate Last Admin   acetaminophen (TYLENOL) tablet 650 mg  650 mg Oral Q6H PRN Cox, Amy N, DO       Or   acetaminophen (TYLENOL) suppository 650 mg  650 mg Rectal Q6H PRN Cox, Amy N, DO       acidophilus (RISAQUAD) capsule 1 capsule  1 capsule Oral Daily Cox, Amy N, DO   1 capsule at 10/20/23 0850   hydrALAZINE (APRESOLINE) injection 5 mg  5 mg Intravenous Q4H PRN Cox, Amy N, DO       insulin aspart (novoLOG) injection 0-5 Units  0-5 Units Subcutaneous QHS Cox, Amy N, DO   3 Units at 10/19/23 2103   insulin aspart (novoLOG) injection 0-9 Units  0-9 Units Subcutaneous TID WC Cox, Amy N, DO   1 Units at 10/20/23 0849   insulin glargine-yfgn (SEMGLEE) injection 10 Units  10 Units Subcutaneous QHS Cox, Amy N, DO   10 Units at 10/19/23 2106   lactated ringers infusion   Intravenous Continuous Marrion Coy, MD 50 mL/hr at 10/20/23 0856 Restarted at 10/20/23 0856   lactulose (CHRONULAC) 10 GM/15ML solution 30 g  30 g Oral QID Marrion Coy, MD   30 g at 10/20/23 1153   levothyroxine (SYNTHROID) tablet 100 mcg  100 mcg Oral Q0600 Cox, Amy N, DO   100 mcg at 10/20/23 8756   melatonin tablet 5 mg  5 mg Oral QHS Cox, Amy N, DO   5 mg at 10/19/23 2106   ondansetron (ZOFRAN) tablet 4 mg  4 mg Oral Q6H PRN Cox, Amy N, DO       Or   ondansetron (ZOFRAN) injection 4 mg  4 mg Intravenous Q6H PRN Cox, Amy N, DO       pantoprazole (PROTONIX) EC tablet 40 mg  40 mg Oral BID Cox, Amy N, DO   40 mg at 10/20/23 0850   rifaximin (XIFAXAN) tablet 550 mg  550 mg Oral BID Cox, Amy N, DO   550 mg at 10/20/23 0849   senna-docusate (Senokot-S) tablet 1 tablet  1 tablet Oral QHS PRN Cox, Amy N, DO       terazosin (HYTRIN) capsule 5 mg  5 mg Oral QHS Marrion Coy, MD   5 mg at 10/19/23 2106     Discharge Medications: Please see  discharge summary for a list of discharge medications.  Relevant Imaging Results:  Relevant Lab Results:   Additional Information SS#: [id number]  Margarito Liner, LCSW

## 2023-10-20 NOTE — TOC Progression Note (Addendum)
Transition of Care [facility]) - Progression Note    Patient Details  Name: Joshua Berry MRN: [id number] Date of Birth: Aug 27, 1946  Transition of Care [facility]) CM/SW Contact  Margarito Liner, LCSW Phone Number: 10/20/2023, 1:11 PM  Clinical Narrative:   CSW met with patient and daughter at bedside. They confirmed plan for return to [facility] for rehab at discharge. CSW emailed the [place] to see if he would need a new authorization to return. Per daughter, patient was supposed to discharge on Friday. Long-term plan to be determined. Discussed referral to [facility] or [facility] to assist with placement after rehab for patient and his wife. Daughter prefers [facility]. Unable to reach liaison by phone. CSW sent referral information to liaison in a secure email.  4:01 pm: CSW faxed VA authorization request.  Expected Discharge Plan and Services                                               Social Determinants of Health (SDOH) Interventions SDOH Screenings   Food Insecurity: Patient Declined (10/17/2023)  Housing: Patient Declined (10/17/2023)  Transportation Needs: Patient Declined (10/17/2023)  Utilities: Patient Declined (10/17/2023)  Depression (PHQ2-9): Low Risk  (04/24/2023)  Financial Resource Strain: Low Risk  (08/06/2023)   Received from [facility]  Social Connections: Unknown (04/29/2022)   [facility]  Tobacco Use: Medium Risk (10/17/2023)    Readmission Risk Interventions    09/15/2023    8:54 AM 08/14/2023    2:18 PM 07/15/2023    8:52 AM  Readmission Risk Prevention Plan  Transportation Screening Complete Complete Complete  PCP or Specialist Appt within 3-5 Days Complete Complete Complete  HRI or Home Care Consult Complete Complete   Social Work Consult for Recovery Care Planning/Counseling Not Complete Complete Complete  Palliative Care Screening Not Applicable Not Applicable Not Applicable  Medication Review  Oceanographer) Not Complete Complete Referral to Pharmacy  Med Review Comments Patient confused and disoriented.

## 2023-10-20 NOTE — Plan of Care (Signed)

## 2023-10-20 NOTE — Progress Notes (Signed)
Progress Note   [name] DOB: 1946-04-24 DOA: 10/16/2023     2 DOS: the patient was seen and examined on 10/20/2023   Brief hospital course: [name] is a 77 year old male with history of liver cirrhosis, hypertension, history of GI bleed, thrombocytopenia, insulin-dependent diabetes mellitus, GERD, who presents emergency department for chief concerns of altered mental status. Ammonia is 89.  Magnesium is 1.5.  CT head showed Mix density subdural hematomas overlying bilateral cerebral hemispheres.  These hematomas have not significantly changed in size since CT head of 09/15/2023.  However there are hyperdense components within both subdural hematoma suggesting interval hemorrhage into these collections.  Balance mass effect upon the underlying cerebral hemispheres within midline shift. Repeated CT head after 6 hours did not show any changes. Patient is seen by neurosurgery, no need for surgery intervention. Patient was also given lactulose.    Principal Problem:   Subdural hematoma (HCC) Active Problems:   Hepatic encephalopathy (HCC)   Pancytopenia (HCC)   HLD (hyperlipidemia)   GERD without esophagitis   Hypothyroidism   Secondary esophageal varices without bleeding (HCC)   Physical deconditioning   Hypomagnesemia   Intracranial hemorrhage (HCC)   Assessment and Plan: Intracranial hemorrhage. Neurosurgery specialist has been consulted and recommends repeat CT of the head without contrast in 6 hours showed no change. I have discussed with Dr. Madaline Brilliant, no surgery intervention. 11/3.  Patient had more confusion today, repeated CT scan still has no change.   Hepatic encephalopathy (HCC) Liver cirrhosis. Ammonia elevated on admission at 89 Patient was resumed on  lactulose and rifaximin, with larger dose increased to 4 times a day.   Patient had a worsening confusion yesterday, started IV fluids.  Still has some confusion today, continue fluids,  patient has not had a bowel movement since yesterday, continue lactulose to 4 times a day, also added MiraLAX.  Discussed with nurse, patient need to have a bowel movement today, will give additional laxative if no bowel movement.   GERD without esophagitis Continue Protonix.   Hypothyroidism Home levothyroxine 100 mcg daily resumed   Hypomagnesemia Minimal metabolic acidosis. Patient appears to have refractory hypomagnesemia, received multiple doses of IV magnesium. Recheck levels tomorrow.  Physical deconditioning Fall precaution   Secondary esophageal varices without bleeding (HCC) Secondary to [name]      Subjective:  Patient still had some confusion today, but seem to be better than yesterday.  Physical Exam: Vitals:   10/19/23 1930 10/19/23 2340 10/20/23 0321 10/20/23 0758  BP: (!) 132/50 (!) 109/58 (!) 114/56 118/62  Pulse: 72 74 64 71  Resp: 17 17 16    Temp: 98.1 F (36.7 C) 98.3 F (36.8 C) 98 F (36.7 C) 98.5 F (36.9 C)  TempSrc: Oral     SpO2: 100% 98% 97% 98%  Weight:      Height:       General exam: Appears calm and comfortable  Respiratory system: Clear to auscultation. Respiratory effort normal. Cardiovascular system: S1 & S2 heard, RRR. No JVD, murmurs, rubs, gallops or clicks. No pedal edema. Gastrointestinal system: Abdomen is nondistended, soft and nontender. No organomegaly or masses felt. Normal bowel sounds heard. Central nervous system: Alert and oriented. No focal neurological deficits. Extremities: Symmetric 5 x 5 power. Skin: No rashes, lesions or ulcers Psychiatry: Judgement and insight appear normal. Mood & affect appropriate.    Data Reviewed:  CT scan and lab results reviewed.  Family Communication: Son updated in the room  Disposition: Status  is: Inpatient Remains inpatient appropriate because: Severity of disease, IV treatment.     Time spent: 35 minutes  Author: Marrion Coy, MD 10/20/2023 11:20 AM  For on call  review [email or web address].

## 2023-10-21 DIAGNOSIS — K7682 Hepatic encephalopathy: Secondary | ICD-10-CM | POA: Diagnosis not present

## 2023-10-21 DIAGNOSIS — Z7189 Other specified counseling: Secondary | ICD-10-CM | POA: Diagnosis not present

## 2023-10-21 DIAGNOSIS — S065XAA Traumatic subdural hemorrhage with loss of consciousness status unknown, initial encounter: Secondary | ICD-10-CM | POA: Diagnosis not present

## 2023-10-21 DIAGNOSIS — D61818 Other pancytopenia: Secondary | ICD-10-CM | POA: Diagnosis not present

## 2023-10-21 LAB — GLUCOSE, CAPILLARY
Glucose-Capillary: 129 mg/dL — ABNORMAL HIGH (ref 70–99)
Glucose-Capillary: 210 mg/dL — ABNORMAL HIGH (ref 70–99)
Glucose-Capillary: 284 mg/dL — ABNORMAL HIGH (ref 70–99)
Glucose-Capillary: 169 mg/dL — ABNORMAL HIGH (ref 70–99)

## 2023-10-21 LAB — CBC
HCT: 27.5 % — ABNORMAL LOW (ref 39.0–52.0)
Hemoglobin: 9.7 g/dL — ABNORMAL LOW (ref 13.0–17.0)
MCH: 32.2 pg (ref 26.0–34.0)
MCHC: 35.3 g/dL (ref 30.0–36.0)
MCV: 91.4 fL (ref 80.0–100.0)
Platelets: 44 10*3/uL — ABNORMAL LOW (ref 150–400)
RBC: 3.01 MIL/uL — ABNORMAL LOW (ref 4.22–5.81)
RDW: 15.4 % (ref 11.5–15.5)
WBC: 2.8 10*3/uL — ABNORMAL LOW (ref 4.0–10.5)
nRBC: 0 % (ref 0.0–0.2)

## 2023-10-21 LAB — BASIC METABOLIC PANEL
Anion gap: 7 (ref 5–15)
BUN: 11 mg/dL (ref 8–23)
CO2: 22 mmol/L (ref 22–32)
Calcium: 8.5 mg/dL — ABNORMAL LOW (ref 8.9–10.3)
Chloride: 106 mmol/L (ref 98–111)
Creatinine, Ser: 0.76 mg/dL (ref 0.61–1.24)
GFR, Estimated: >60 mL/min (ref >60)
Glucose, Bld: 127 mg/dL — ABNORMAL HIGH (ref 70–99)
Potassium: 3.9 mmol/L (ref 3.5–5.1)
Sodium: 135 mmol/L (ref 135–145)

## 2023-10-21 LAB — PHOSPHORUS: Phosphorus: 3.3 mg/dL (ref 2.5–4.6)

## 2023-10-21 LAB — AMMONIA: Ammonia: 78 umol/L — ABNORMAL HIGH (ref 9–35)

## 2023-10-21 LAB — MAGNESIUM: Magnesium: 1.6 mg/dL — ABNORMAL LOW (ref 1.7–2.4)

## 2023-10-21 MED ORDER — FLEET ENEMA RE ENEM: 1.0000 | ENEMA | Freq: Once | RECTAL | Status: DC

## 2023-10-21 MED ORDER — MAGNESIUM SULFATE 4 GM/100ML IV SOLN
4.0000 g | Freq: Once | INTRAVENOUS | Status: AC
  Administered 2023-10-21: 4 g via INTRAVENOUS
  Filled 2023-10-21: qty 100

## 2023-10-21 MED ORDER — LACTULOSE 10 GM/15ML PO SOLN
45.0000 g | Freq: Four times a day (QID) | ORAL | Status: DC
  Administered 2023-10-21 – 2023-10-23 (×9): 45 g via ORAL
  Filled 2023-10-21 (×9): qty 90

## 2023-10-21 MED ORDER — INSULIN ASPART 100 UNIT/ML IJ SOLN
4.0000 [IU] | Freq: Three times a day (TID) | INTRAMUSCULAR | Status: DC
  Administered 2023-10-21 – 2023-11-10 (×56): 4 [IU] via SUBCUTANEOUS
  Filled 2023-10-21 (×56): qty 1

## 2023-10-21 MED ORDER — SENNOSIDES-DOCUSATE SODIUM 8.6-50 MG PO TABS
2.0000 | ORAL_TABLET | Freq: Two times a day (BID) | ORAL | Status: DC
  Administered 2023-10-21 – 2023-11-10 (×32): 2 via ORAL
  Filled 2023-10-21 (×38): qty 2

## 2023-10-21 NOTE — Consult Note (Signed)
[facility] Liaison Note  10/21/2023  Joshua Berry Mar 28, 1946 [id number]  Charted screen due to green banner. Pt utilizes VA benefits for povider and services. Location: [facility] Liaison screened the patient remotely at [facility].  Insurance: [employer]   Joshua Berry is a 77 y.o. male who is a Primary Care Patient of [facility] The patient was screened for 30 day readmission hospitalization with noted extreme risk score for unplanned readmission risk with 5 IP in 6 months.  The patient was assessed for potential Care Management service needs for post hospital transition for care coordination. Review of patient's electronic medical record reveals patient was admitted for Subdural hematoma. PT recommendation for SNF, TOC will utilize VA benefits for discharge disposition.   VBCI Care Management/Population Health does not replace or interfere with any arrangements made by the Inpatient Transition of Care team.   For questions contact:   [name], RN, [facility] Liaison [place]   [facility], Population Health Office Hours MTWF  8:00 am-6:00 pm Direct Dial: [phone] mobile [phone] [Office toll free line] Office Hours are M-F 8:30 - 5 pm [email or web address]

## 2023-10-21 NOTE — Progress Notes (Signed)
   10/21/23 1600  Spiritual Encounters  Type of Visit Initial  Care provided to: Patient  Referral source Family;Nurse (RN/NT/LPN)  Reason for visit Advance directives  OnCall Visit Yes  Spiritual Framework  Presenting Themes Goals in life/care  Patient Stress Factors Major life changes  Family Stress Factors Not reviewed  Interventions  Spiritual Care Interventions Made Established relationship of care and support;Compassionate presence  Intervention Outcomes  Outcomes Connection to spiritual care;Awareness of support  Spiritual Care Plan  Spiritual Care Issues Still Outstanding Chaplain will continue to follow   Spoke briefly with patients about complete AD. Patient daughter was already gone to work will try to complete AD when she return. Need new form to fill out name was place on form before the witnesses and notary saw the form. Will let oncoming chaplain know to get AD completed tomorrow between [phone]

## 2023-10-21 NOTE — TOC Progression Note (Addendum)
Transition of Care [facility]) - Progression Note    Patient Details  Name: Joshua Berry MRN: [id number] Date of Birth: Mar 15, 1946  Transition of [facility]) CM/SW Contact  Margarito Liner, LCSW Phone Number: 10/21/2023, 11:07 AM  Clinical Narrative:   CSW left voicemail for Coralee North, RN at the [place] to confirm they received auth request that was faxed over yesterday.  3:26 pm: Received call back from [place]. She confirmed she received fax but it will not be reviewed for authorization consideration until tomorrow. They will call CSW with decision.  Expected Discharge Plan and Services                                               Social Determinants of Health (SDOH) Interventions SDOH Screenings   Food Insecurity: Patient Declined (10/17/2023)  Housing: Patient Declined (10/17/2023)  Transportation Needs: Patient Declined (10/17/2023)  Utilities: Patient Declined (10/17/2023)  Depression (PHQ2-9): Low Risk  (04/24/2023)  Financial Resource Strain: Low Risk  (08/06/2023)   Received from [facility]  Social Connections: Unknown (04/29/2022)   Received from [facility], [facility]  Tobacco Use: Medium Risk (10/17/2023)    Readmission Risk Interventions    09/15/2023    8:54 AM 08/14/2023    2:18 PM 07/15/2023    8:52 AM  Readmission Risk Prevention Plan  Transportation Screening Complete Complete Complete  PCP or Specialist Appt within 3-5 Days Complete Complete Complete  HRI or Home Care Consult Complete Complete   Social Work Consult for Recovery Care Planning/Counseling Not Complete Complete Complete  Palliative Care Screening Not Applicable Not Applicable Not Applicable  Medication Review Oceanographer) Not Complete Complete Referral to Pharmacy  Med Review Comments Patient confused and disoriented.

## 2023-10-21 NOTE — Progress Notes (Signed)
Progress Note   [name]:[id number] DOB: [birth year] DOA: 10/16/2023     3 DOS: the patient was seen and examined on 10/21/2023   Brief hospital course: [name] is a 77 year old male with history of liver cirrhosis, hypertension, history of GI bleed, thrombocytopenia, insulin-dependent diabetes mellitus, GERD, who presents emergency department for chief concerns of altered mental status. Ammonia is 89.  Magnesium is 1.5.  CT head showed Mix density subdural hematomas overlying bilateral cerebral hemispheres.  These hematomas have not significantly changed in size since CT head of 09/15/2023.  However there are hyperdense components within both subdural hematoma suggesting interval hemorrhage into these collections.  Balance mass effect upon the underlying cerebral hemispheres within midline shift. Repeated CT head after 6 hours did not show any changes. Patient is seen by neurosurgery, no need for surgery intervention. Patient was also given lactulose.    Principal Problem:   Subdural hematoma (HCC) Active Problems:   Hepatic encephalopathy (HCC)   Pancytopenia (HCC)   HLD (hyperlipidemia)   GERD without esophagitis   Hypothyroidism   Secondary esophageal varices without bleeding (HCC)   Physical deconditioning   Hypomagnesemia   Intracranial hemorrhage (HCC)   Assessment and Plan: intracranial hemorrhage. Neurosurgery specialist has been consulted and recommends repeat CT of the head without contrast in 6 hours showed no change. I have discussed with Dr. Madaline Brilliant, no surgery intervention. 11/3.  Patient had more confusion today, repeated CT scan still has no change.   Hepatic encephalopathy (HCC) Liver cirrhosis. Secondary esophageal varices without bleeding (HCC) Pancytopenia secondary to liver cirrhosis. This admission, patient had a persistent altered mental status and persistent elevation in ammonia level.  Initially increase lactulose to 30 g 4 times  a day, but did not seem to generate a lot of stool, he received 2 doses of MiraLAX followed by Fleet enema yesterday, produced a large bowel movement.  But still has significant confusion and increased ammonia level.  I will increase lactulose to 45 g 4 times a day.  Continue IV fluids.  Uncontrolled type 2 diabetes with hyperglycemia Glucose still running high, continue insulin glargine, added scheduled NovoLog in addition to sliding scale insulin.   GERD without esophagitis Continue Protonix.   Hypothyroidism Home levothyroxine 100 mcg daily resumed   Hypomagnesemia Minimal metabolic acidosis. Patient appears to have refractory hypomagnesemia, received multiple doses of IV magnesium. Will give additional 4 g of magnesium sulfate.  Recheck levels tomorrow.   Physical deconditioning Overweight with BMI 27.76. Fall precaution        Subjective:  Patient feels better today, less confused.  No nausea vomiting.  Physical Exam: Vitals:   10/20/23 2317 10/21/23 0354 10/21/23 0844 10/21/23 1229  BP: (!) 110/56 (!) 108/57 (!) 117/53 (!) 103/59  Pulse: 72 72 (!) 40 71  Resp: 16 17    Temp: 99 F (37.2 C) 99.7 F (37.6 C) 98 F (36.7 C) 98.1 F (36.7 C)  TempSrc:      SpO2: 96% 100% 98% 99%  Weight:      Height:       General exam: Appears calm and comfortable  Respiratory system: Clear to auscultation. Respiratory effort normal. Cardiovascular system: S1 & S2 heard, RRR. No JVD, murmurs, rubs, gallops or clicks. No pedal edema. Gastrointestinal system: Abdomen is nondistended, soft and nontender. No organomegaly or masses felt. Normal bowel sounds heard. Central nervous system: Alert and oriented. No focal neurological deficits. Extremities: Symmetric 5 x 5 power. Skin: No rashes, lesions  or ulcers Psychiatry: Judgement and insight appear normal. Mood & affect appropriate.    Data Reviewed:  Lab results reviewed.  Family Communication: Sister updated at  bedside.  Disposition: Status is: Inpatient Remains inpatient appropriate because: Severity of disease, IV treatment.  Altered mental status.     Time spent: 35 minutes  Author: Marrion Coy, MD 10/21/2023 1:53 PM  For on call review [email or web address].

## 2023-10-21 NOTE — Plan of Care (Signed)
  Problem: Health Behavior/Discharge Planning: Goal: Ability to identify and utilize available resources and services will improve Outcome: Progressing Goal: Ability to manage health-related needs will improve Outcome: Progressing   Problem: Metabolic: Goal: Ability to maintain appropriate glucose levels will improve Outcome: Progressing   Problem: Nutritional: Goal: Maintenance of adequate nutrition will improve Outcome: Progressing Goal: Progress toward achieving an optimal weight will improve Outcome: Progressing

## 2023-10-21 NOTE — Progress Notes (Addendum)
Daily Progress Note   Patient Name: Joshua Berry       Date: 10/21/2023 DOB: 1946-10-21  Age: 77 y.o. MRN#: [id number] Attending Physician: Marrion Coy, MD Primary Care Physician: [facility] Admit Date: 10/16/2023  Reason for Consultation/Follow-up: Establishing goals of care  Subjective: Notes and labs reviewed.  In to see patient.  He is currently sitting in a bedside chair with his stepdaughter Joshua Berry at bedside.  Patient advises he is married and has 2 stepchildren.  He advises that he has a child who lives with her mother due to cognitive disability.  Stepdaughter inquires about discussing goals of care as she is interested in his thoughts on care moving forward and returning to the hospital.  She articulates in detail his previous hospitalizations, rehab stays, and his continuing decline.  We discussed his diagnosis, prognosis, GOC, EOL wishes disposition and options.  Created space and opportunity for patient  to explore thoughts and feelings regarding current medical information.   A detailed discussion was had today regarding advanced directives.  Concepts specific to code status, artifical feeding and hydration, IV antibiotics and rehospitalization were discussed.  The difference between an aggressive medical intervention path and a comfort care path was discussed.  Values and goals of care important to patient and family were attempted to be elicited.  Discussed limitations of medical interventions to prolong quality of life in some situations and discussed the concept of human mortality.  Patient confirms DNR/DNI.  He and his stepdaughter discuss the option of hospice care.  He states he is unsure if he would want to return to the hospital or not.  He is unsure if he wants  to continue his lactulose.  He discusses that even when he takes his lactulose he has a lot of difficulty with controlling his ammonia levels.  He states he will need to give this some thought.  He states at this time he would like to return to rehab and will discuss this with him then.  Patient does state he wants his stepdaughter Joshua Berry to be his surrogate Management consultant.  Advance directive packet provided to patient and stepdaughter for completion.  Called to speak with chaplain and discussed patient's desire to complete this form.  Epic consult to chaplain services also placed.    Length of Stay: 3  Current Medications: Scheduled Meds:   acidophilus  1 capsule Oral Daily   insulin aspart  0-5 Units Subcutaneous QHS   insulin aspart  0-9 Units Subcutaneous TID WC   insulin aspart  4 Units Subcutaneous TID WC   insulin glargine-yfgn  10 Units Subcutaneous QHS   lactulose  45 g Oral QID   levothyroxine  100 mcg Oral Q0600   melatonin  5 mg Oral QHS   pantoprazole  40 mg Oral BID   rifaximin  550 mg Oral BID   terazosin  5 mg Oral QHS    Continuous Infusions:  lactated ringers 50 mL/hr at 10/21/23 0250    PRN Meds: acetaminophen **OR** acetaminophen, ondansetron **OR** ondansetron (ZOFRAN) IV, senna-docusate  Physical Exam Pulmonary:     Effort: Pulmonary effort is normal.  Neurological:     Mental Status: He is alert.             Vital Signs: BP (!) 103/59 (BP Location: Left Arm)   Pulse 71   Temp 98.1 F (36.7 C)   Resp 17   Ht 6\' 2"  (1.88 m)   Wt 98.1 kg   SpO2 99%   BMI 27.76 kg/m  SpO2: SpO2: 99 % O2 Device: O2 Device: Room Air O2 Flow Rate: O2 Flow Rate (L/min): 0 L/min  Intake/output summary:  Intake/Output Summary (Last 24 hours) at 10/21/2023 1535 Last data filed at 10/21/2023 1000 Gross per 24 hour  Intake --  Output 2200 ml  Net -2200 ml   LBM: Last BM Date : 10/20/23 Baseline Weight: Weight: 98.1 kg Most recent weight: Weight: 98.1 kg   Patient  Active Problem List   Diagnosis Date Noted   Intracranial hemorrhage (HCC) 10/17/2023   Encephalopathy 09/15/2023   Right wrist pain 08/14/2023   Bilateral subdural hematomas (HCC) 08/13/2023   Malnutrition of moderate degree 08/13/2023   Decubitus ulcer of coccyx, stage I 08/13/2023   Acute metabolic encephalopathy 08/12/2023   Staphylococcus epidermidis bacteremia 07/23/2023   Acute nontraumatic intracranial subdural hematoma (HCC) 07/15/2023   Subdural hematoma (HCC) 07/14/2023   Compression of brain (HCC) 07/14/2023   Uncal herniation (HCC) 07/14/2023   Type 2 diabetes mellitus without complications (HCC) 06/17/2023   GERD without esophagitis 06/17/2023   Dyslipidemia 06/17/2023   Pressure injury of skin 06/17/2023   Acute urinary retention 04/11/2023   Streptococcal bacteremia 04/07/2023   Altered mental status 04/06/2023   Hepatic encephalopathy (HCC) 04/05/2023   Overweight (BMI 25.0-29.9) 08/03/2022   Hyponatremia, hypokalemia and hypomagnesemia 05/18/2022   Hyponatremia 05/18/2022   Hypokalemia 05/18/2022   Back pain 05/17/2022   Hypomagnesemia 05/17/2022   Acute hepatic encephalopathy (HCC) 05/17/2022   AKI (acute kidney injury) (HCC) 05/16/2022   Elevated liver enzymes 05/16/2022   Hypotension 05/16/2022   Right shoulder pain 05/16/2022   Hypothyroidism 05/16/2022   Physical deconditioning 05/16/2022   Obesity (BMI 30-39.9) 05/16/2022   Lactic acidosis 05/16/2022   Fall 05/16/2022   Septic shock due to Staphylococcus bacteremia 05/14/2022   Chest pain 04/06/2022   Controlled IDDM-2 with hyperglycemia 04/06/2022   HLD (hyperlipidemia) 04/06/2022   Chronic diastolic CHF (congestive heart failure) (HCC) 04/06/2022   Pancytopenia (HCC) 04/06/2022   GI bleeding 04/24/2021   Cirrhosis of liver without ascites (HCC)    Secondary esophageal varices without bleeding (HCC)    Portal hypertension (HCC)    Stomach irritation    Melena    Acute gastrointestinal  hemorrhage 07/19/2019   Severe sepsis (HCC) 12/20/2018    Palliative  Care Assessment & Plan    Recommendations/Plan: Patient is considering care moving forward.  He states he will discuss this further with the employees at his rehab facility. Chaplain consulted for advanced directives.  Patient is very clear he would want his stepdaughter Joshua Berry to be his surrogate Management consultant. PMT will shadow peripherally.  Code Status:    Code Status Orders  (From admission, onward)           Start     Ordered   10/16/23 1624  Do not attempt resuscitation (DNR)- Limited -Do Not Intubate (DNI)  Continuous       Question Answer Comment  If pulseless and not breathing No CPR or chest compressions.   In Pre-Arrest Conditions (Patient Is Breathing and Has A Pulse) Do not intubate. Provide all appropriate non-invasive medical interventions. Avoid ICU transfer unless indicated or required.   Consent: Discussion documented in EHR or advanced directives reviewed      10/16/23 1624           Code Status History     Date Active Date Inactive Code Status Order ID Comments User Context   09/15/2023 0907 09/25/2023 2031 Do not attempt resuscitation (DNR) PRE-ARREST INTERVENTIONS DESIRED [id number]  Floydene Flock, MD ED   08/13/2023 0016 08/20/2023 1920 DNR [id number]  Lorretta Harp, MD ED   07/14/2023 2035 08/01/2023 2157 DNR [id number]  Rust-Chester, Cecelia Byars, NP Inpatient   07/14/2023 1908 07/14/2023 2035 Full Code [id number]  Rust-Chester, Cecelia Byars, NP Inpatient   06/17/2023 0505 06/18/2023 2038 DNR [id number]  Arville Care, Vernetta Honey, MD ED   04/05/2023 1958 04/17/2023 1914 DNR [id number]  Cox, Amy N, DO ED   04/05/2023 1800 04/05/2023 1957 Full Code [id number]  Cox, Amy N, DO ED   08/02/2022 2233 08/05/2022 1853 Full Code [id number]  Rometta Emery, MD ED   05/14/2022 1611 05/28/2022 1932 Full Code [id number]  Vida Rigger, MD ED   04/06/2022 1806 04/07/2022 2105 DNR [id number]  Lorretta Harp, MD ED   04/06/2022 1548 04/06/2022  1806 Full Code [id number]  Lorretta Harp, MD ED   04/24/2021 2321 04/26/2021 1902 Full Code [id number]  Mansy, Vernetta Honey, MD ED   08/20/2019 0719 08/22/2019 1952 Full Code [id number]  Arnaldo Natal ED   07/19/2019 1925 07/23/2019 2106 Full Code [id number]  Pearletha Alfred, NP ED   12/20/2018 1949 12/25/2018 2052 Full Code [id number]  Katha Hamming, MD ED        Thank you for allowing the Palliative Medicine Team to assist in the care of this patient.    Morton Stall, NP  Please contact Palliative Medicine Team phone at [phone] for questions and concerns.

## 2023-10-21 NOTE — Progress Notes (Signed)
Occupational Therapy Treatment Patient Details Name: Joshua Berry MRN: [id number] DOB: 1946-02-12 Today's Date: 10/21/2023   History of present illness Joshua Berry is a 77 year old male with history of liver cirrhosis, hypertension, history of GI bleed, thrombocytopenia, insulin-dependent diabetes mellitus, GERD, who presents emergency department for chief concerns of altered mental status. MD assessment includes ICH, hepatic encephalopathy, hypothyroidism, hypomagnesemia, and physical deconditioning.   OT comments  Pt seen for OT treatment on this date. Upon arrival to room pt seated in chair, agreeable to tx with encouragement. Pt reported being in chair for 5 hrs and feeling very fatigued. Pt required Mod A for stand pivot from chair to bed. Pt completed sit to supine t/f with Min A and left supine in bed with call bell within reach and all needs met. Pt making good progress toward goals, will continue to follow POC. Discharge recommendation remains appropriate.        If plan is discharge home, recommend the following:  A lot of help with bathing/dressing/bathroom;Help with stairs or ramp for entrance;A lot of help with walking and/or transfers   Equipment Recommendations  Other (comment)    Recommendations for Other Services      Precautions / Restrictions Precautions Precautions: Fall Restrictions Weight Bearing Restrictions: No       Mobility Bed Mobility                    Transfers Overall transfer level: Needs assistance Equipment used: Rolling walker (2 wheels) Transfers: Bed to chair/wheelchair/BSC Sit to Stand: Mod assist Stand pivot transfers: Mod assist               Balance Overall balance assessment: Needs assistance Sitting-balance support: Feet supported, Bilateral upper extremity supported Sitting balance-Leahy Scale: Good     Standing balance support: Bilateral upper extremity supported Standing balance-Leahy Scale: Fair                              ADL either performed or assessed with clinical judgement   ADL Overall ADL's : Needs assistance/impaired                             Toileting- Clothing Manipulation and Hygiene: Sit to/from stand;Moderate assistance;Cueing for safety Toileting - Clothing Manipulation Details (indicate cue type and reason): simulated     Functional mobility during ADLs: Moderate assistance;Cueing for safety      Extremity/Trunk Assessment Upper Extremity Assessment Upper Extremity Assessment: Overall WFL for tasks assessed   Lower Extremity Assessment Lower Extremity Assessment: Generalized weakness        Vision Patient Visual Report: No change from baseline     Perception     Praxis      Cognition Arousal: Alert Behavior During Therapy: WFL for tasks assessed/performed Overall Cognitive Status: Within Functional Limits for tasks assessed                                          Exercises      Shoulder Instructions       General Comments      Pertinent Vitals/ Pain       Pain Assessment Pain Assessment: No/denies pain  Home Living  Prior Functioning/Environment              Frequency  Min 1X/week        Progress Toward Goals  OT Goals(current goals can now be found in the care plan section)  Progress towards OT goals: Progressing toward goals     Plan      Co-evaluation                 AM-PAC OT "6 Clicks" Daily Activity     Outcome Measure   Help from another person eating meals?: None Help from another person taking care of personal grooming?: A Little Help from another person toileting, which includes using toliet, bedpan, or urinal?: A Lot Help from another person bathing (including washing, rinsing, drying)?: A Lot Help from another person to put on and taking off regular upper body clothing?: A Little Help from another  person to put on and taking off regular lower body clothing?: A Lot 6 Click Score: 16    End of Session Equipment Utilized During Treatment: Rolling walker (2 wheels)  OT Visit Diagnosis: Other abnormalities of gait and mobility (R26.89);Muscle weakness (generalized) (M62.81)   Activity Tolerance Patient tolerated treatment well   Patient Left with call bell/phone within reach;in bed;with bed alarm set   Nurse Communication          Time: 3762-8315 OT Time Calculation (min): 12 min  Charges:    Butch Penny, SOT

## 2023-10-22 DIAGNOSIS — S065XAA Traumatic subdural hemorrhage with loss of consciousness status unknown, initial encounter: Secondary | ICD-10-CM | POA: Diagnosis not present

## 2023-10-22 LAB — BASIC METABOLIC PANEL
Anion gap: 4 — ABNORMAL LOW (ref 5–15)
BUN: 12 mg/dL (ref 8–23)
CO2: 23 mmol/L (ref 22–32)
Calcium: 8.7 mg/dL — ABNORMAL LOW (ref 8.9–10.3)
Chloride: 109 mmol/L (ref 98–111)
Creatinine, Ser: 0.65 mg/dL (ref 0.61–1.24)
GFR, Estimated: >60 mL/min (ref >60)
Glucose, Bld: 182 mg/dL — ABNORMAL HIGH (ref 70–99)
Potassium: 3.7 mmol/L (ref 3.5–5.1)
Sodium: 136 mmol/L (ref 135–145)

## 2023-10-22 LAB — CBC
HCT: 27.6 % — ABNORMAL LOW (ref 39.0–52.0)
Hemoglobin: 9.7 g/dL — ABNORMAL LOW (ref 13.0–17.0)
MCH: 31.8 pg (ref 26.0–34.0)
MCHC: 35.1 g/dL (ref 30.0–36.0)
MCV: 90.5 fL (ref 80.0–100.0)
Platelets: 45 10*3/uL — ABNORMAL LOW (ref 150–400)
RBC: 3.05 MIL/uL — ABNORMAL LOW (ref 4.22–5.81)
RDW: 15.5 % (ref 11.5–15.5)
WBC: 2.7 10*3/uL — ABNORMAL LOW (ref 4.0–10.5)
nRBC: 0 % (ref 0.0–0.2)

## 2023-10-22 LAB — AMMONIA: Ammonia: 73 umol/L — ABNORMAL HIGH (ref 9–35)

## 2023-10-22 LAB — GLUCOSE, CAPILLARY
Glucose-Capillary: 148 mg/dL — ABNORMAL HIGH (ref 70–99)
Glucose-Capillary: 218 mg/dL — ABNORMAL HIGH (ref 70–99)
Glucose-Capillary: 217 mg/dL — ABNORMAL HIGH (ref 70–99)

## 2023-10-22 LAB — MAGNESIUM: Magnesium: 1.8 mg/dL (ref 1.7–2.4)

## 2023-10-22 MED ORDER — FAMOTIDINE 20 MG PO TABS
20.0000 mg | ORAL_TABLET | Freq: Every day | ORAL | Status: DC
  Administered 2023-10-22 – 2023-11-10 (×20): 20 mg via ORAL
  Filled 2023-10-22 (×20): qty 1

## 2023-10-22 MED ORDER — ATORVASTATIN CALCIUM 20 MG PO TABS
20.0000 mg | ORAL_TABLET | Freq: Every day | ORAL | Status: DC
  Administered 2023-10-22 – 2023-11-09 (×19): 20 mg via ORAL
  Filled 2023-10-22 (×19): qty 1

## 2023-10-22 MED ORDER — KETOTIFEN FUMARATE 0.035 % OP SOLN
1.0000 [drp] | Freq: Two times a day (BID) | OPHTHALMIC | Status: DC
  Administered 2023-10-22 – 2023-11-10 (×37): 1 [drp] via OPHTHALMIC
  Filled 2023-10-22: qty 5

## 2023-10-22 MED ORDER — FUROSEMIDE 20 MG PO TABS
20.0000 mg | ORAL_TABLET | Freq: Every day | ORAL | Status: DC
  Administered 2023-10-22 – 2023-11-10 (×20): 20 mg via ORAL
  Filled 2023-10-22 (×20): qty 1

## 2023-10-22 NOTE — TOC Progression Note (Signed)
Transition of Care [facility]) - Progression Note    Patient Details  Name: Joshua Berry MRN: [id number] Date of Birth: March 06, 1946  [facility] CM/SW Contact  Margarito Liner, LCSW Phone Number: 10/22/2023, 4:09 PM  Clinical Narrative:  CSW left voicemail for Coralee North, RN at the [facility] to check the auth status. [employer] contacted the [employer] who confirmed they are okay to continue overseeing patient's care while at [facility] as long as Vilinda Boehringer approves.  Expected Discharge Plan: Skilled Nursing Facility    Expected Discharge Plan and Services                                               Social Determinants of Health (SDOH) Interventions SDOH Screenings   Food Insecurity: Patient Declined (10/17/2023)  Housing: Patient Declined (10/17/2023)  Transportation Needs: Patient Declined (10/17/2023)  Utilities: Patient Declined (10/17/2023)  Depression (PHQ2-9): Low Risk  (04/24/2023)  Financial Resource Strain: Low Risk  (08/06/2023)   Received from [facility]  Social Connections: Unknown (04/29/2022)   Received from [facility], [facility]  Tobacco Use: Medium Risk (10/17/2023)    Readmission Risk Interventions    09/15/2023    8:54 AM 08/14/2023    2:18 PM 07/15/2023    8:52 AM  Readmission Risk Prevention Plan  Transportation Screening Complete Complete Complete  PCP or Specialist Appt within 3-5 Days Complete Complete Complete  HRI or Home Care Consult Complete Complete   Social Work Consult for Recovery Care Planning/Counseling Not Complete Complete Complete  Palliative Care Screening Not Applicable Not Applicable Not Applicable  Medication Review Oceanographer) Not Complete Complete Referral to Pharmacy  Med Review Comments Patient confused and disoriented.

## 2023-10-22 NOTE — Progress Notes (Signed)
Physical Therapy Treatment Patient Details Name: Joshua Berry MRN: [id number] DOB: 11/27/1946 Today's Date: 10/22/2023   History of Present Illness [name] is a 77 year old male with history of liver cirrhosis, hypertension, history of GI bleed, thrombocytopenia, insulin-dependent diabetes mellitus, GERD, who presents emergency department for chief concerns of altered mental status. MD assessment includes ICH, hepatic encephalopathy, hypothyroidism, hypomagnesemia, and physical deconditioning.    PT Comments  Pt received seated in recliner upon arrival to room and pt agreeable to therapy.  Pt continues to require modA from therapist to come upright into standing likely due to the height of the recliner and the height of the pt.  Pt performed a total of 4 STS's and was able to ambulate to the doorway on the first attempt before returning to the recliner.  After seated rest break, pt then ambulated outside the doorway to the nursing station before returning.  Pt encouraged to continue with ambulation in order to improve overall strength.  Pt left in recliner with all needs met and call bell within reach.       If plan is discharge home, recommend the following: A little help with walking and/or transfers;A little help with bathing/dressing/bathroom;Assistance with cooking/housework;Assist for transportation   Can travel by private vehicle     Yes  Equipment Recommendations  None recommended by PT    Recommendations for Other Services       Precautions / Restrictions Precautions Precautions: Fall Restrictions Weight Bearing Restrictions: No     Mobility  Bed Mobility               General bed mobility comments: pt upright in recliner upon arrival and at conclusion of session.    Transfers Overall transfer level: Needs assistance Equipment used: Rolling walker (2 wheels) Transfers: Bed to chair/wheelchair/BSC Sit to Stand: Mod assist           General transfer  comment: pt performed STS x3, req cuing for hand placement    Ambulation/Gait Ambulation/Gait assistance: Contact guard assist Gait Distance (Feet): 20 Feet Assistive device: Rolling walker (2 wheels) Gait Pattern/deviations: Decreased stride length, Decreased step length - right, Decreased step length - left, Step-through pattern, Trunk flexed, Narrow base of support Gait velocity: decreased     General Gait Details: very slow cadence, with cuing pt able to correct narrow BOS   Stairs             Wheelchair Mobility     Tilt Bed    Modified Rankin (Stroke Patients Only)       Balance Overall balance assessment: Needs assistance Sitting-balance support: Feet supported, Bilateral upper extremity supported Sitting balance-Leahy Scale: Good     Standing balance support: Bilateral upper extremity supported Standing balance-Leahy Scale: Fair Standing balance comment: heavily reliant on RW for stability                            Cognition Arousal: Alert Behavior During Therapy: WFL for tasks assessed/performed Overall Cognitive Status: Within Functional Limits for tasks assessed                                          Exercises      General Comments        Pertinent Vitals/Pain Pain Assessment Pain Assessment: No/denies pain    Home Living  Prior Function            PT Goals (current goals can now be found in the care plan section) Acute Rehab PT Goals Patient Stated Goal: get stronger PT Goal Formulation: With patient Time For Goal Achievement: 10/31/23 Potential to Achieve Goals: Good Progress towards PT goals: Progressing toward goals    Frequency    Min 1X/week      PT Plan      Co-evaluation              AM-PAC PT "6 Clicks" Mobility   Outcome Measure  Help needed turning from your back to your side while in a flat bed without using bedrails?: A Little Help  needed moving from lying on your back to sitting on the side of a flat bed without using bedrails?: A Little Help needed moving to and from a bed to a chair (including a wheelchair)?: A Little Help needed standing up from a chair using your arms (e.g., wheelchair or bedside chair)?: A Little Help needed to walk in hospital room?: A Little Help needed climbing 3-5 steps with a railing? : A Lot 6 Click Score: 17    End of Session Equipment Utilized During Treatment: Gait belt Activity Tolerance: Patient tolerated treatment well Patient left: in chair;with call bell/phone within reach;with family/visitor present;with chair alarm set Nurse Communication: Mobility status PT Visit Diagnosis: Muscle weakness (generalized) (M62.81);Difficulty in walking, not elsewhere classified (R26.2) Pain - Right/Left: Left Pain - part of body: Knee     Time: 1610-9604 PT Time Calculation (min) (ACUTE ONLY): 30 min  Charges:    $Therapeutic Activity: 23-37 mins PT General Charges $$ ACUTE PT VISIT: 1 Visit                      Nolon Bussing, PT, DPT Physical Therapist - [facility]  10/22/23, 3:55 PM

## 2023-10-22 NOTE — Progress Notes (Signed)
   10/22/23 1400  Spiritual Encounters  Type of Visit Follow up  Care provided to: Patient  Referral source Nurse (RN/NT/LPN)  Reason for visit Advance directives  OnCall Visit No  Spiritual Framework  Presenting Themes Goals in life/care  Patient Stress Factors Major life changes  Family Stress Factors Not reviewed  Interventions  Spiritual Care Interventions Made Established relationship of care and support  Intervention Outcomes  Outcomes Connection to spiritual care  Spiritual Care Plan  Spiritual Care Issues Still Outstanding Chaplain will continue to follow   Received phone call spoke with nurse about AD for patient. Visit room PT daughter had already left for work. I will return in the morning between the hours of 9:00-11:00 to get AD completed for family and patient.

## 2023-10-22 NOTE — Progress Notes (Signed)
  Chaplain On-Call made follow up visit upon referral from Chaplain Resident Deeann Dowse.  Joshua Berry had met the patient and daughter yesterday, and heard their desire for the patient to complete Advance Directives information.  However, those documents were not signed in the presence of Witnesses and a Notary.  Chaplain Thelma Lorenzetti provided a new Advance Directives packet to the patient, who stated he will discuss with his daughter later today.  Chaplain Tawny Hopping M.Div., [facility]

## 2023-10-22 NOTE — Progress Notes (Signed)
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  NH4 89, improving slowly with high doses of lactulose.  CT head showed mixed density subdural hematomas overlying bilateral cerebral hemispheres, stable since 09/15/2023; however there are hyperdense components within both subdural hematoma suggesting interval hemorrhage into these collections.  Repeat CT head after 6 hours did not show any changes.  Patient seen by neurosurgery, no need for surgery intervention.   *Patient is medically stable and awaiting discharge to SNF.  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 -Presented with acute onset of AMS  -Ammonia 89 on presentation, now improved to 73 -No apparent s/sx of infection -He has been treated for encephalopathy with lactulose titrated to 2-4 soft stools/day - currently on 45g QID -Continue Rifaximin   Cirrhosis -MELD/MELD-Na score is 12/15, with a mortality rate of 6.6% -Platelets 45, generally stable -Continue Lasix   Intracranial hemorrhage -Neurosurgery consulted -Repeat CT of the head without contrast in 6 hours showed no change -No surgical intervention is needed   Uncontrolled type 2 diabetes with hyperglycemia -A1c 5.9, good control -hold Glucophage -Cover with sensitive-scale SSI    GERD without esophagitis -Continue Protonix    Hypothyroidism -Continue levothyroxine         Consultants: Neurosurgery Palliative care PT/OT   Procedures: None   Antibiotics: None  30 Day Unplanned Readmission Risk Score    Flowsheet Row ED to Hosp-Admission (Current) from 10/16/2023 in [facility] PCU  30 Day Unplanned Readmission Risk Score (%) 44.02 Filed at 10/22/2023 1600       This score is the patient's risk of an unplanned readmission within 30 days of being discharged (0 -100%). The score is based on dignosis, age, lab data, medications, orders, and past utilization.   Low:  0-14.9   Medium: 15-21.9   High: 22-29.9   Extreme: 30 and above           Subjective: He reports that his mental status is back to baseline - he does have periodic word finding difficulties at baseline but no longer feels confused. No new concerns.    Objective: Vitals:   10/22/23 1143 10/22/23 1608  BP: (!) 108/50 (!) 108/56  Pulse: 62 70  Resp: 16 16  Temp: 98.4 F (36.9 C) 98 F (36.7 C)  SpO2: 97% 100%    Intake/Output Summary (Last 24 hours) at 10/22/2023 1639 Last data filed at 10/22/2023 1415 Gross per 24 hour  Intake 2694.02 ml  Output 975 ml  Net 1719.02 ml   Filed Weights   10/17/23 2035  Weight: 98.1 kg    Exam:  General:  Appears calm and comfortable and is in NAD Eyes:   EOMI, normal lids, iris ENT:  grossly normal hearing, lips & tongue, mmm Neck:  no LAD, masses or thyromegaly Cardiovascular:  RRR, no m/r/g. No LE edema.  Respiratory:   CTA bilaterally with no wheezes/rales/rhonchi.  Normal  respiratory effort. Abdomen:  soft, NT, ND Skin:  no rash or induration seen on limited exam Musculoskeletal:  grossly normal tone BUE/BLE, good ROM, no bony abnormality Psychiatric:  grossly normal mood and affect, speech fluent and appropriate, AOx3 Neurologic:  CN 2-12 grossly intact, moves all extremities in coordinated fashion  Data Reviewed: I have reviewed the patient's lab results  since admission.  Pertinent labs for today include:   Glucose 182 NH4 73, slowly improving WBC 2.7 Hgb 9.7 Platelets 45      Family Communication: None present; I spoke with his son by telephone  Disposition: Status is: Inpatient Remains inpatient appropriate because: unsafe disposition      Time spent: 35 minutes  Unresulted Labs (From admission, onward)     Start     Ordered   10/23/23 0500  Ammonia  Tomorrow morning,   R       Question:  Specimen collection method  Answer:  Lab=Lab collect   10/22/23 1639   10/23/23 0500  CBC with Differential/Platelet  Tomorrow morning,   R       Question:  Specimen collection method  Answer:  Lab=Lab collect   10/22/23 1639             Author: Jonah Blue, MD 10/22/2023 4:39 PM  For on call review [email or web address].

## 2023-10-23 DIAGNOSIS — S065XAA Traumatic subdural hemorrhage with loss of consciousness status unknown, initial encounter: Secondary | ICD-10-CM | POA: Diagnosis not present

## 2023-10-23 LAB — CBC WITH DIFFERENTIAL/PLATELET
Abs Immature Granulocytes: 0.01 10*3/uL (ref 0.00–0.07)
Basophils Absolute: 0 10*3/uL (ref 0.0–0.1)
Basophils Relative: 1 %
Eosinophils Absolute: 0.1 10*3/uL (ref 0.0–0.5)
Eosinophils Relative: 6 %
HCT: 27.1 % — ABNORMAL LOW (ref 39.0–52.0)
Hemoglobin: 9.5 g/dL — ABNORMAL LOW (ref 13.0–17.0)
Immature Granulocytes: 0 %
Lymphocytes Relative: 26 %
Lymphs Abs: 0.6 10*3/uL — ABNORMAL LOW (ref 0.7–4.0)
MCH: 32.3 pg (ref 26.0–34.0)
MCHC: 35.1 g/dL (ref 30.0–36.0)
MCV: 92.2 fL (ref 80.0–100.0)
Monocytes Absolute: 0.2 10*3/uL (ref 0.1–1.0)
Monocytes Relative: 10 %
Neutro Abs: 1.3 10*3/uL — ABNORMAL LOW (ref 1.7–7.7)
Neutrophils Relative %: 57 %
Platelets: 44 10*3/uL — ABNORMAL LOW (ref 150–400)
RBC: 2.94 MIL/uL — ABNORMAL LOW (ref 4.22–5.81)
RDW: 15.3 % (ref 11.5–15.5)
WBC: 2.3 10*3/uL — ABNORMAL LOW (ref 4.0–10.5)
nRBC: 0 % (ref 0.0–0.2)

## 2023-10-23 LAB — COMPREHENSIVE METABOLIC PANEL
ALT: 23 U/L (ref 0–44)
AST: 34 U/L (ref 15–41)
Albumin: 2.3 g/dL — ABNORMAL LOW (ref 3.5–5.0)
Alkaline Phosphatase: 98 U/L (ref 38–126)
Anion gap: 6 (ref 5–15)
BUN: 13 mg/dL (ref 8–23)
CO2: 24 mmol/L (ref 22–32)
Calcium: 8.4 mg/dL — ABNORMAL LOW (ref 8.9–10.3)
Chloride: 104 mmol/L (ref 98–111)
Creatinine, Ser: 0.75 mg/dL (ref 0.61–1.24)
GFR, Estimated: >60 mL/min (ref >60)
Glucose, Bld: 157 mg/dL — ABNORMAL HIGH (ref 70–99)
Potassium: 3.6 mmol/L (ref 3.5–5.1)
Sodium: 134 mmol/L — ABNORMAL LOW (ref 135–145)
Total Bilirubin: 1.3 mg/dL — ABNORMAL HIGH (ref <1.2)
Total Protein: 5.7 g/dL — ABNORMAL LOW (ref 6.5–8.1)

## 2023-10-23 LAB — AMMONIA
Ammonia: 106 umol/L — ABNORMAL HIGH (ref 9–35)
Ammonia: 93 umol/L — ABNORMAL HIGH (ref 9–35)

## 2023-10-23 LAB — PROTIME-INR
INR: 1.4 — ABNORMAL HIGH (ref 0.8–1.2)
Prothrombin Time: 17.6 s — ABNORMAL HIGH (ref 11.4–15.2)

## 2023-10-23 LAB — GLUCOSE, CAPILLARY
Glucose-Capillary: 163 mg/dL — ABNORMAL HIGH (ref 70–99)
Glucose-Capillary: 208 mg/dL — ABNORMAL HIGH (ref 70–99)
Glucose-Capillary: 231 mg/dL — ABNORMAL HIGH (ref 70–99)
Glucose-Capillary: 187 mg/dL — ABNORMAL HIGH (ref 70–99)

## 2023-10-23 MED ORDER — LACTULOSE 10 GM/15ML PO SOLN
45.0000 g | ORAL | Status: DC
  Administered 2023-10-23 – 2023-11-04 (×47): 45 g via ORAL
  Filled 2023-10-23 (×58): qty 90

## 2023-10-23 MED ORDER — ORAL CARE MOUTH RINSE: 15.0000 mL | OROMUCOSAL | Status: DC | PRN

## 2023-10-23 MED ORDER — DICLOFENAC SODIUM 1 % EX GEL
2.0000 g | Freq: Four times a day (QID) | CUTANEOUS | Status: DC
Start: 2023-10-23 — End: 2023-11-10
  Administered 2023-10-23 – 2023-11-10 (×62): 2 g via TOPICAL
  Filled 2023-10-23 (×3): qty 100

## 2023-10-23 NOTE — TOC Progression Note (Addendum)
Transition of Care [facility]) - Progression Note    Patient Details  Name: Joshua Berry MRN: [id number] Date of Birth: [birth year]  Transition of Care [facility]) CM/SW Contact  Margarito Liner, LCSW Phone Number: 10/23/2023, 8:24 AM  Clinical Narrative:   Received a call from Coralee North, RN with the [facility]. They did not have a physician available yesterday for the authorization request meeting but they will meet today at 1:00.  1:56 pm: Received call from the [facility]. Berkley Harvey is approved for 30 days of rehab with a contract for indefinite placement if needed. [facility] admissions coordinator will check their auth portal to see if she has received the authorization details yet. CSW left daughter [name] a Engineer, technical sales.  2:54 pm: Per MD, plan to discharge tomorrow. SNF admissions coordinator is aware. CSW left voicemail for daughter to notify. MD will update her as well.  Expected Discharge Plan: Skilled Nursing Facility    Expected Discharge Plan and Services                                               Social Determinants of Health (SDOH) Interventions SDOH Screenings   Food Insecurity: Patient Declined (10/17/2023)  Housing: Patient Declined (10/17/2023)  Transportation Needs: Patient Declined (10/17/2023)  Utilities: Patient Declined (10/17/2023)  Depression (PHQ2-9): Low Risk  (04/24/2023)  Financial Resource Strain: Low Risk  (08/06/2023)   Received from [facility]  Social Connections: Unknown (04/29/2022)   Received from [facility]  Tobacco Use: Medium Risk (10/17/2023)    Readmission Risk Interventions    09/15/2023    8:54 AM 08/14/2023    2:18 PM 07/15/2023    8:52 AM  Readmission Risk Prevention Plan  Transportation Screening Complete Complete Complete  PCP or Specialist Appt within 3-5 Days Complete Complete Complete  HRI or Home Care Consult Complete Complete   Social Work Consult for Recovery Care Planning/Counseling Not Complete  Complete Complete  Palliative Care Screening Not Applicable Not Applicable Not Applicable  Medication Review Oceanographer) Not Complete Complete Referral to Pharmacy  Med Review Comments Patient confused and disoriented.

## 2023-10-23 NOTE — Progress Notes (Signed)
   10/23/23 1000  Spiritual Encounters  Type of Visit Initial  Care provided to: Pt and family  Referral source Family  Reason for visit Advance directives  OnCall Visit Yes   Chaplain facilitated completion of Advance Directives. 1 copy uploaded to [email or web address]. 1 copy placed in physical file and original and 2 additional copies given to patient.

## 2023-10-23 NOTE — Plan of Care (Signed)
  Problem: Education: Goal: Ability to describe self-care measures that may prevent or decrease complications (Diabetes Survival Skills Education) will improve Outcome: Progressing Goal: Individualized Educational Video(s) Outcome: Progressing   Problem: Education: Goal: Individualized Educational Video(s) Outcome: Progressing   Problem: Coping: Goal: Ability to adjust to condition or change in health will improve Outcome: Progressing   

## 2023-10-23 NOTE — Progress Notes (Signed)
Progress Note   [name] [id number] DOB: 04-10-1946 DOA: 10/16/2023     5 DOS: the patient was seen and examined on 10/23/2023   Brief hospital course: 77yo with h/o NASH cirrhosis, HTN, and DM who presented on 10/31 with AMS.  NH4 89, improving slowly with high doses of lactulose.  CT head showed mixed density subdural hematomas overlying bilateral cerebral hemispheres, stable since 09/15/2023; however there are hyperdense components within both subdural hematoma suggesting interval hemorrhage into these collections.  Repeat CT head after 6 hours did not show any changes.  Patient seen by neurosurgery, no need for surgery intervention.  Persistently elevated NH4 levels, waxing and waning confusion.  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 -Presented with acute onset of AMS  -Ammonia 89 on presentation, improved to 73 yesterday, and back up to 106 today -No apparent s/sx of infection -He has been treated for encephalopathy with lactulose titrated to 2-4 soft stools/day - currently on 45g QID and now increasing to q4h with hold parameters -Continue Rifaximin   Cirrhosis -MELD/MELD-Na score is 12/15, with a mortality rate of 6.6% -Platelets 45, generally stable -Continue Lasix   Intracranial hemorrhage -Neurosurgery consulted -Repeat CT of the head without contrast in 6 hours showed no change -No surgical intervention is needed    Uncontrolled type 2 diabetes with hyperglycemia -A1c 5.9, good control -hold Glucophage -Cover with sensitive-scale SSI    GERD without esophagitis -Continue Protonix   Hypothyroidism -Continue levothyroxine    Note[facility].  He was approved to go today but will hold dc for an additional day due to uptrending NH4 and somnolence today.       Consultants: Neurosurgery Palliative care PT/OT   Procedures: None   Antibiotics: None    30 Day Unplanned Readmission Risk Score    Flowsheet Row ED to Hosp-Admission (Current) from 10/16/2023 in [facility] PCU  30 Day Unplanned Readmission Risk Score (%) 43.76 Filed at 10/23/2023 1200       This score is the patient's risk of an unplanned readmission within 30 days of being discharged (0 -100%). The score is based on dignosis, age, lab data, medications, orders, and past utilization.   Low:  0-14.9   Medium: 15-21.9   High: 22-29.9   Extreme: 30 and above           Subjective: More somnolent today, reports mild confusion.   Objective: Vitals:   10/23/23 0819 10/23/23 1217  BP: (!) 110/55 (!) 104/54  Pulse: 60 67  Resp: 20 20  Temp: 98.5 F (36.9 C) 98.2 F (36.8 C)  SpO2: 98% 100%    Intake/Output Summary (Last 24 hours) at 10/23/2023 1439 Last data filed at 10/22/2023 2000 Gross per 24 hour  Intake 480 ml  Output --  Net 480 ml   Filed Weights   10/17/23 2035  Weight: 98.1 kg    Exam:  General:  Appears calm and comfortable and is in NAD, mildly somnolent today Eyes:  PERRL, EOMI, normal lids, iris ENT:  grossly normal hearing, lips & tongue, mmm Neck:  no LAD, masses or thyromegaly Cardiovascular:  RRR, no m/r/g. No LE edema.  Respiratory:   CTA bilaterally with no wheezes/rales/rhonchi.  Normal respiratory effort. Abdomen:  soft, NT, ND Skin:  no rash or induration seen on limited exam Musculoskeletal:  grossly normal tone BUE/BLE, good ROM,  no bony abnormality Psychiatric:  mildly somnolent mood and affect, speech sparse but appropriate Neurologic:  CN 2-12 grossly intact, moves all extremities in coordinated fashion  Data Reviewed: I have reviewed the patient's lab results since admission.  Pertinent labs for today include:   Na++ 134 Glucose 157 Albumin 2.3 NH 106 Bili 1.3 WBC 2.3 Hgb 9.5 Platelets 44 INR 1.4     Family Communication: None present; I spoke with his wife by telephone, unable to reach his son or daughter  Disposition: Status is: Inpatient Remains inpatient appropriate because: elevated NH4     Time spent: 50 minutes  Unresulted Labs (From admission, onward)     Start     Ordered   10/24/23 0500  Basic metabolic panel  Tomorrow morning,   R       Question:  Specimen collection method  Answer:  Lab=Lab collect   10/23/23 1435   10/24/23 0500  CBC with Differential/Platelet  Tomorrow morning,   R       Question:  Specimen collection method  Answer:  Lab=Lab collect   10/23/23 1435   10/23/23 1436  Ammonia  Once,   R       Question:  Specimen collection method  Answer:  Lab=Lab collect   10/23/23 1435             Author: Jonah Blue, MD 10/23/2023 2:39 PM  For on call review [email or web address].

## 2023-10-23 NOTE — Progress Notes (Signed)
PT Cancellation Note  Patient Details Name: NYZIER BOIVIN MRN: [id number] DOB: 1946-09-16   Cancelled Treatment:    Reason Eval/Treat Not Completed: Patient declined, no reason specified;Fatigue/lethargy limiting ability to participate. Patient sleeping in recliner and declined PT at this time. Asks if we can come earlier tomorrow. Made a note of that. Will re-attempt at later date.    Delina Kruczek 10/23/2023, 2:18 PM

## 2023-10-23 NOTE — Progress Notes (Signed)
Occupational Therapy Treatment Patient Details Name: Joshua Berry MRN: [id number] DOB: 1946/01/22 Today's Date: 10/23/2023   History of present illness [name] is a 77 year old male with history of liver cirrhosis, hypertension, history of GI bleed, thrombocytopenia, insulin-dependent diabetes mellitus, GERD, who presents emergency department for chief concerns of altered mental status. MD assessment includes ICH, hepatic encephalopathy, hypothyroidism, hypomagnesemia, and physical deconditioning.   OT comments  Upon entering the room, pt supine in bed and agreeable to OT intervention. Pt performing supine >sit with increased effort and min A to EOB. Pt stands with mod A from standard bed height with posterior bias. Pt's brief is soiled with urine and removed. He takes several steps to the R with with min - mod A and mod multimodal cuing for management of RW, balance, and hand placement. Pt able to void while seated on BSC and returns to sit on EOB at end of session. New brief donned while pt is seated. Pt stands again with mod A and takes several steps in same manner over to recliner chair. Chair alarm activated and call bell within reach. Pt continues to benefit from OT intervention.       If plan is discharge home, recommend the following:  A lot of help with bathing/dressing/bathroom;Help with stairs or ramp for entrance;A lot of help with walking and/or transfers   Equipment Recommendations  Other (comment) (defer to next venue of care)       Precautions / Restrictions Precautions Precautions: Fall       Mobility Bed Mobility Overal bed mobility: Needs Assistance Bed Mobility: Supine to Sit     Supine to sit: Min assist          Transfers Overall transfer level: Needs assistance Equipment used: Rolling walker (2 wheels) Transfers: Bed to chair/wheelchair/BSC Sit to Stand: Mod assist                 Balance Overall balance assessment: Needs  assistance Sitting-balance support: Feet supported, Bilateral upper extremity supported Sitting balance-Leahy Scale: Good     Standing balance support: Bilateral upper extremity supported, Reliant on assistive device for balance, During functional activity Standing balance-Leahy Scale: Fair                             ADL either performed or assessed with clinical judgement   ADL Overall ADL's : Needs assistance/impaired                         Toilet Transfer: Moderate assistance;BSC/3in1;Ambulation;Rolling walker (2 wheels) Toilet Transfer Details (indicate cue type and reason): step pivot transfer Toileting- Clothing Manipulation and Hygiene: Sit to/from stand;Moderate assistance;Cueing for safety Toileting - Clothing Manipulation Details (indicate cue type and reason): pt needing assistance for clothing management and hygiene     Functional mobility during ADLs: Cueing for safety;Minimal assistance      Extremity/Trunk Assessment Upper Extremity Assessment Upper Extremity Assessment: Generalized weakness   Lower Extremity Assessment Lower Extremity Assessment: Generalized weakness        Vision Patient Visual Report: No change from baseline            Cognition Arousal: Alert Behavior During Therapy: WFL for tasks assessed/performed Overall Cognitive Status: Within Functional Limits for tasks assessed  Pertinent Vitals/ Pain       Pain Assessment Pain Assessment: No/denies pain         Frequency  Min 1X/week        Progress Toward Goals  OT Goals(current goals can now be found in the care plan section)  Progress towards OT goals: Progressing toward goals      AM-PAC OT "6 Clicks" Daily Activity     Outcome Measure   Help from another person eating meals?: None Help from another person taking care of personal grooming?: A Little Help from another  person toileting, which includes using toliet, bedpan, or urinal?: A Lot Help from another person bathing (including washing, rinsing, drying)?: A Lot Help from another person to put on and taking off regular upper body clothing?: A Little Help from another person to put on and taking off regular lower body clothing?: A Lot 6 Click Score: 16    End of Session Equipment Utilized During Treatment: Rolling walker (2 wheels)  OT Visit Diagnosis: Other abnormalities of gait and mobility (R26.89);Muscle weakness (generalized) (M62.81)   Activity Tolerance Patient tolerated treatment well   Patient Left with call bell/phone within reach;in bed;with bed alarm set   Nurse Communication          Time: 4098-1191 OT Time Calculation (min): 29 min  Charges: OT General Charges $OT Visit: 1 Visit OT Treatments $Self Care/Home Management : 23-37 mins  Jackquline Denmark, MS, OTR/L , CBIS ascom [phone]  10/23/23, 11:58 AM

## 2023-10-24 DIAGNOSIS — S065XAA Traumatic subdural hemorrhage with loss of consciousness status unknown, initial encounter: Secondary | ICD-10-CM | POA: Diagnosis not present

## 2023-10-24 LAB — CBC WITH DIFFERENTIAL/PLATELET
Abs Immature Granulocytes: 0.01 10*3/uL (ref 0.00–0.07)
Basophils Absolute: 0 10*3/uL (ref 0.0–0.1)
Basophils Relative: 1 %
Eosinophils Absolute: 0.1 10*3/uL (ref 0.0–0.5)
Eosinophils Relative: 6 %
HCT: 27.8 % — ABNORMAL LOW (ref 39.0–52.0)
Hemoglobin: 9.7 g/dL — ABNORMAL LOW (ref 13.0–17.0)
Immature Granulocytes: 0 %
Lymphocytes Relative: 30 %
Lymphs Abs: 0.7 10*3/uL (ref 0.7–4.0)
MCH: 32.2 pg (ref 26.0–34.0)
MCHC: 34.9 g/dL (ref 30.0–36.0)
MCV: 92.4 fL (ref 80.0–100.0)
Monocytes Absolute: 0.3 10*3/uL (ref 0.1–1.0)
Monocytes Relative: 12 %
Neutro Abs: 1.2 10*3/uL — ABNORMAL LOW (ref 1.7–7.7)
Neutrophils Relative %: 51 %
Platelets: 46 10*3/uL — ABNORMAL LOW (ref 150–400)
RBC: 3.01 MIL/uL — ABNORMAL LOW (ref 4.22–5.81)
RDW: 15.4 % (ref 11.5–15.5)
WBC: 2.3 10*3/uL — ABNORMAL LOW (ref 4.0–10.5)
nRBC: 0 % (ref 0.0–0.2)

## 2023-10-24 LAB — GLUCOSE, CAPILLARY
Glucose-Capillary: 138 mg/dL — ABNORMAL HIGH (ref 70–99)
Glucose-Capillary: 239 mg/dL — ABNORMAL HIGH (ref 70–99)
Glucose-Capillary: 249 mg/dL — ABNORMAL HIGH (ref 70–99)
Glucose-Capillary: 142 mg/dL — ABNORMAL HIGH (ref 70–99)

## 2023-10-24 LAB — BASIC METABOLIC PANEL
Anion gap: 7 (ref 5–15)
BUN: 13 mg/dL (ref 8–23)
CO2: 22 mmol/L (ref 22–32)
Calcium: 8.8 mg/dL — ABNORMAL LOW (ref 8.9–10.3)
Chloride: 110 mmol/L (ref 98–111)
Creatinine, Ser: 0.64 mg/dL (ref 0.61–1.24)
GFR, Estimated: >60 mL/min (ref >60)
Glucose, Bld: 143 mg/dL — ABNORMAL HIGH (ref 70–99)
Potassium: 3.6 mmol/L (ref 3.5–5.1)
Sodium: 139 mmol/L (ref 135–145)

## 2023-10-24 LAB — AMMONIA: Ammonia: 48 umol/L — ABNORMAL HIGH (ref 9–35)

## 2023-10-24 MED ORDER — LACTULOSE 10 GM/15ML PO SOLN: 45.0000 g | ORAL | Status: DC

## 2023-10-24 MED ORDER — DICLOFENAC SODIUM 1 % EX GEL: 2.0000 g | Freq: Four times a day (QID) | CUTANEOUS | Status: DC

## 2023-10-24 NOTE — Care Management Important Message (Signed)
Important Message  Patient Details  Name: Joshua Berry MRN: [id number] Date of Birth: Sep 26, 1946   Important Message Given:  Yes - Medicare and Tricare IM     [name] A Ohn Bostic 10/24/2023, 12:06 PM

## 2023-10-24 NOTE — Discharge Summary (Signed)
Physician Discharge Summary   [name] MRN: [id number] DOB: March 15, 1946  Admit date:     10/16/2023  Discharge date: 10/24/23  Discharge Physician: Jonah Blue   [facility]: [facility]   Recommendations at discharge:   Increase lactulose to 45 grams every 4 hours; hold for more than 2-3 soft stools daily You are returning to [facility] for ongoing care  Discharge Diagnoses: Principal Problem:   Subdural hematoma (HCC) Active Problems:   Hepatic encephalopathy (HCC)   Pancytopenia (HCC)   HLD (hyperlipidemia)   GERD without esophagitis   Hypothyroidism   Overweight (BMI 25.0-29.9)   Secondary esophageal varices without bleeding [facility])   Physical deconditioning   Hypomagnesemia   Intracranial hemorrhage [facility])   Hospital Course: 77yo with h/o NASH cirrhosis, HTN, and DM who presented on 10/31 with AMS.  NH4 89, improving slowly with high doses of lactulose.  CT head showed mixed density subdural hematomas overlying bilateral cerebral hemispheres, stable since 09/15/2023; however there are hyperdense components within both subdural hematoma suggesting interval hemorrhage into these collections.  Repeat CT head after 6 hours did not show any changes.  Patient seen by neurosurgery, no need for surgery intervention.  Persistently elevated NH4 levels, waxing and waning confusion.  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 -Presented with acute onset of AMS  -Ammonia 89 on presentation, improved to 48 today with cognitive clearing -No apparent s/sx of infection -He has  been treated for encephalopathy with lactulose titrated to 2-4 soft stools/day - currently on 45g q4h with hold parameters -Continue Rifaximin   Cirrhosis -MELD/MELD-Na score is 12/15, with a mortality rate of 6.6% -Platelets 45, generally stable -Continue Lasix   Intracranial hemorrhage -Neurosurgery consulted -Repeat CT of the head without contrast in 6 hours showed no change -No surgical intervention is needed   Uncontrolled type 2 diabetes with hyperglycemia -A1c 5.9, good control -Resume Glucophage   GERD without esophagitis -Continue Protonix   Hypothyroidism -Continue levothyroxine  DNR -A gold out of facility DNR form is signed and in his chart     Note: [facility].  He was approved to go as of 11/7, will dc today.       Consultants: Neurosurgery Palliative care PT/OT   Procedures: None   Antibiotics: None    30 Day Unplanned Readmission Risk Score    Flowsheet Row ED to Hosp-Admission (Current) from 10/16/2023 in [facility]  30 Day Unplanned Readmission Risk Score (%) 42.08 Filed at 10/24/2023 0801       This score is the patient's risk of an unplanned readmission within 30 days of being discharged (0 -100%). The score is based on dignosis, age, lab data, medications, orders, and past utilization.   Low:  0-14.9   Medium: 15-21.9   High: 22-29.9   Extreme: 30 and above         Pain control - [place] Controlled Substance Reporting System database was reviewed. and patient was instructed, not to drive, operate heavy machinery, perform activities at heights, swimming or participation in water activities or provide baby-sitting services while on Pain, Sleep  and Anxiety Medications; until their outpatient Physician has advised to do so again. Also recommended to not to take more than prescribed Pain, Sleep and Anxiety Medications.    Disposition: Skilled nursing facility Diet  recommendation:  Carb modified diet DISCHARGE MEDICATION: Allergies as of 10/24/2023       Reactions   Camphor Swelling, Rash   Latex Rash   Lisinopril Cough   Other Reaction(s): Cough   Sulfamethoxazole Swelling   Chocolate    Family states he is allergic   Nsaids Other (See Comments)   NASH        Medication List     STOP taking these medications    magnesium oxide 400 (240 Mg) MG tablet Commonly known as: MAG-OX   polyethylene glycol 17 g packet Commonly known as: MIRALAX / GLYCOLAX       TAKE these medications    acetaminophen 500 MG tablet Commonly known as: TYLENOL Take 1 tablet (500 mg total) by mouth every 6 (six) hours as needed for moderate pain, fever, headache or mild pain (headache).   atorvastatin 20 MG tablet Commonly known as: LIPITOR Take 20 mg by mouth at bedtime.   butalbital-acetaminophen-caffeine 50-325-40 MG tablet Commonly known as: FIORICET Take 1 tablet by mouth every 8 (eight) hours as needed for headache.   Cholecalciferol 50 MCG (2000 UT) Tabs Take 1 tablet by mouth daily.   clotrimazole 1 % external solution Commonly known as: LOTRIMIN Apply 1 Application topically 2 (two) times daily. Apply to toe nails   diclofenac Sodium 1 % Gel Commonly known as: VOLTAREN Apply 2 g topically 4 (four) times daily.   famotidine 20 MG tablet Commonly known as: PEPCID Take 20 mg by mouth daily.   feeding supplement Liqd Take 237 mLs by mouth 3 (three) times daily between meals.   Fish Oil 1000 MG Caps Take 1 capsule by mouth 2 (two) times daily.   furosemide 20 MG tablet Commonly known as: LASIX Take 1 tablet (20 mg total) by mouth daily.   Gold Bond Ultimate Lotn Apply 1 Application topically in the morning and at bedtime. Apply to bilateral lower legs for dry skin   insulin aspart 100 UNIT/ML injection Commonly known as: novoLOG Inject 5 Units into the skin 3 (three) times daily before meals.   insulin glargine 100 UNIT/ML  injection Commonly known as: LANTUS Inject 0.1 mLs (10 Units total) into the skin at bedtime.   ketotifen 0.025 % ophthalmic solution Commonly known as: ZADITOR Place 1 drop into both eyes 2 (two) times daily.   lactulose 10 GM/15ML solution Commonly known as: CHRONULAC Take 67.5 mLs (45 g total) by mouth every 4 (four) hours. What changed:  how much to take when to take this   levothyroxine 100 MCG tablet Commonly known as: SYNTHROID Take 100 mcg by mouth daily before breakfast.   lidocaine 5 % Commonly known as: LIDODERM Place 2 patches onto the skin daily. Remove & Discard patch within 12 hours or as directed by MD   Magnesium Oxide 420 MG Tabs Take 2 tablets (840 mg total) by mouth 2 (two) times daily.   melatonin 3 MG Tabs tablet Take 6 mg by mouth at bedtime.   multivitamin with minerals Tabs tablet Take 1 tablet by mouth daily.   pantoprazole 40 MG tablet Commonly known as: Protonix Take 1 tablet (40 mg total) by mouth 2 (two) times daily.   RA Probiotic Digestive Care Caps Take 1 capsule by mouth daily.   rifaximin  550 MG Tabs tablet Commonly known as: XIFAXAN Take 550 mg by mouth 2 (two) times daily. Takes with lactulose   terazosin 5 MG capsule Commonly known as: HYTRIN Take 5 mg by mouth at bedtime.   thiamine 100 MG tablet Commonly known as: Vitamin B-1 Take 1 tablet (100 mg total) by mouth daily.        Contact information for after-discharge care     Destination     [facility] .   Service: Skilled Nursing Contact information: [address] [place] [address] [phone]                    Discharge Exam:    Subjective: Awake, alert, pleasant, feels ready to go to SNF.   Objective: Vitals:   10/24/23 0552 10/24/23 0805  BP: (!) 105/55 (!) 101/55  Pulse: (!) 57 69  Resp:  20  Temp:  98.4 F (36.9 C)  SpO2:  98%    Intake/Output Summary (Last 24 hours) at 10/24/2023 1133 Last  data filed at 10/24/2023 1048 Gross per 24 hour  Intake 480 ml  Output --  Net 480 ml   Filed Weights   10/17/23 2035 10/24/23 0552  Weight: 98.1 kg 97.4 kg    Exam:  General:  Appears calm and comfortable and is in NAD, awake and alert Eyes:   EOMI, normal lids, iris ENT:  grossly normal hearing, lips & tongue, mmm Neck:  no LAD, masses or thyromegaly Cardiovascular:  RRR, no m/r/g. No LE edema.  Respiratory:   CTA bilaterally with no wheezes/rales/rhonchi.  Normal respiratory effort. Abdomen:  soft, NT, ND Skin:  no rash or induration seen on limited exam Musculoskeletal:  grossly normal tone BUE/BLE, good ROM, no bony abnormality Psychiatric:  pleasant mood and affect, speech without difficulties Neurologic:  CN 2-12 grossly intact, moves all extremities in coordinated fashion  Data Reviewed: I have reviewed the patient's lab results since admission.  Pertinent labs for today include:   Glucose 143 NH4 48 WBC 2.3 Hgb 9.7 - stable Platelets 46 - stable    Condition at discharge: good  The results of significant diagnostics from this hospitalization (including imaging, microbiology, ancillary and laboratory) are listed below for reference.   Imaging Studies: CT HEAD WO CONTRAST ( )  Result Date: 10/19/2023 CLINICAL DATA:  Follow-up subdural hematoma EXAM: CT HEAD WITHOUT CONTRAST TECHNIQUE: Contiguous axial images were obtained from the base of the skull through the vertex without intravenous contrast. RADIATION DOSE REDUCTION: This exam was performed according to the departmental dose-optimization program which includes automated exposure control, adjustment of the mA and/or kV according to patient size and/or use of iterative reconstruction technique. COMPARISON:  CT head 10/16/2023 FINDINGS: Brain: Mixed density bilateral subdural hematomas are again seen measuring up to approximately 5 mm in thickness in the coronal plane bilaterally, unchanged. There is no mass  effect or midline shift. There is no new acute intracranial hemorrhage. There is no new acute infarct. Parenchymal volume is stable. The ventricles are stable in size. Background chronic small-vessel ischemic change is stable. The pituitary and suprasellar region are normal. There is no mass lesion. Vascular: No hyperdense vessel or unexpected calcification. Skull: Bilateral frontoparietal burr holes are again noted. Sinuses/Orbits: The paranasal sinuses are clear. The globes and orbits are unremarkable. Other: The mastoid air cells and middle ear cavities are clear. IMPRESSION: Unchanged 5 mm thick bilateral mixed density cerebral convexity subdural hematomas without mass effect or midline shift. Electronically  Signed   By: Lesia Hausen M.D.   On: 10/19/2023 17:36   CT Head Wo Contrast  Result Date: 10/16/2023 CLINICAL DATA:  acute on chronic SDH, AMS, 6 hour follow up to examine stability EXAM: CT HEAD WITHOUT CONTRAST TECHNIQUE: Contiguous axial images were obtained from the base of the skull through the vertex without intravenous contrast. RADIATION DOSE REDUCTION: This exam was performed according to the departmental dose-optimization program which includes automated exposure control, adjustment of the mA and/or kV according to patient size and/or use of iterative reconstruction technique. COMPARISON:  Same day CT head FINDINGS: Brain: Unchanged appearance of bilateral cerebral convexity mixed density subdural hematomas measuring up to 7 mm on the left and 5 mm on the right. No significant midline shift. No new sites of hemorrhage. No CT evidence of an acute cortical infarct. No hydrocephalus. There is sequela of mild chronic microvascular ischemic change. Vascular: No hyperdense vessel or unexpected calcification. Skull: Normal. Negative for fracture or focal lesion. Sinuses/Orbits: No middle ear or mastoid effusion. Paranasal sinuses are clear. Orbits are unremarkable. Other: None. IMPRESSION: Unchanged  appearance of bilateral cerebral convexity mixed density subdural hematomas measuring up to 7 mm on the left and 5 mm on the right. No significant midline shift. No new sites of hemorrhage. Electronically Signed   By: Lorenza Cambridge M.D.   On: 10/16/2023 18:54   CT Head Wo Contrast  Result Date: 10/16/2023 CLINICAL DATA:  Provided history: Altered mental status, nontraumatic. EXAM: CT HEAD WITHOUT CONTRAST TECHNIQUE: Contiguous axial images were obtained from the base of the skull through the vertex without intravenous contrast. RADIATION DOSE REDUCTION: This exam was performed according to the departmental dose-optimization program which includes automated exposure control, adjustment of the mA and/or kV according to patient size and/or use of iterative reconstruction technique. COMPARISON:  Head CT 09/15/2023. FINDINGS: Brain: Mild generalized parenchymal atrophy. A mixed density subdural hematoma overlying the right cerebral hemisphere has not significantly changed in size since the head CT of 08/19/2023, again measuring up to 8 mm in thickness (remeasured on prior). Moderate-volume hyperdense components are present within this hematoma. A mixed density subdural hematoma overlying the left cerebral hemisphere has not significantly changed in size since the prior CT, also measuring up to 8 mm in thickness (remeasured on prior). Small-volume hyperdense components are present within this hematoma. Balanced mass effect upon the bilateral cerebral hemispheres without midline shift. Patchy and ill-defined hypoattenuation within the cerebral white matter, nonspecific but compatible with mild chronic small vessel ischemic disease. No acute demarcated cortical infarct. No evidence of an intracranial mass. Vascular: No hyperdense vessel.  Atherosclerotic calcifications Skull: No acute calvarial fracture or aggressive osseous lesion. Bilateral frontoparietal burr holes. Sinuses/Orbits: No mass or acute finding within  the imaged orbits. Mucosal thickening and secretions within a posterior left ethmoid air cell. Impression #1 called by telephone at the time of interpretation on 10/16/2023 at 12:14 pm to provider Dr. [name], who verbally acknowledged these results. IMPRESSION: 1. Mixed density subdural hematomas overlying the bilateral cerebral hemispheres. These hematomas have not significant changed in size since the head CT of 09/15/2023. However, there are hyperdense components within both subdural hematomas suggesting interval hemorrhage into these collections. Balanced mass effect upon the underlying cerebral hemispheres without midline shift. 2. Mild chronic small ischemic changes within the cerebral white matter. 3. Mild generalized parenchymal atrophy. Electronically Signed   By: Jackey Loge D.O.   On: 10/16/2023 12:18    Microbiology: Results for orders placed or performed  during the hospital encounter of 10/16/23  MRSA Next Gen by PCR, Nasal     Status: None   Collection Time: 10/18/23  6:42 AM   Specimen: Nasal Mucosa; Nasal Swab  Result Value Ref Range Status   MRSA by PCR Next Gen NOT DETECTED NOT DETECTED Final    Comment: (NOTE) The GeneXpert MRSA Assay (FDA approved for NASAL specimens only), is one component of a comprehensive MRSA colonization surveillance program. It is not intended to diagnose MRSA infection nor to guide or monitor treatment for MRSA infections. Test performance is not FDA approved in patients less than 63 years old. Performed at [facility], [address] [place], [place] [address]     Labs: CBC: Recent Labs  Lab 10/20/23 [phone] 10/21/23 0454 10/22/23 0440 10/23/23 0835 10/24/23 0506  WBC 3.1* 2.8* 2.7* 2.3* 2.3*  NEUTROABS  --   --   --  1.3* 1.2*  HGB 10.0* 9.7* 9.7* 9.5* 9.7*  HCT 28.3* 27.5* 27.6* 27.1* 27.8*  MCV 89.6 91.4 90.5 92.2 92.4  PLT 46* 44* 45* 44* 46*   Basic Metabolic Panel: Recent Labs  Lab 10/18/23 0856 10/19/23 0551  10/20/23 0431 10/21/23 0454 10/22/23 0440 10/23/23 0835 10/24/23 0506  NA 137 137 135 135 136 134* 139  K 3.9 3.6 3.5 3.9 3.7 3.6 3.6  CL 108 109 106 106 109 104 110  CO2 21* 21* 23 22 23 24 22   GLUCOSE 149* 146* 159* 127* 182* 157* 143*  BUN 10 11 13 11 12 13 13   CREATININE 0.62 0.76 0.77 0.76 0.65 0.75 0.64  CALCIUM 9.0 8.9 8.6* 8.5* 8.7* 8.4* 8.8*  MG 1.5* 1.6* 1.8 1.6* 1.8  --   --   PHOS  --   --  3.6 3.3  --   --   --    Liver Function Tests: Recent Labs  Lab 10/23/23 0835  AST 34  ALT 23  ALKPHOS 98  BILITOT 1.3*  PROT 5.7*  ALBUMIN 2.3*   CBG: Recent Labs  Lab 10/23/23 0816 10/23/23 1214 10/23/23 1553 10/23/23 2054 10/24/23 0802  GLUCAP 163* 208* 231* 187* 138*    Discharge time spent: greater than 30 minutes.  Signed: Jonah Blue, MD [facility] 10/24/2023

## 2023-10-24 NOTE — Progress Notes (Signed)
Physical Therapy Treatment Patient Details Name: Joshua Berry MRN: [id number] DOB: Dec 14, 1946 Today's Date: 10/24/2023   History of Present Illness [name] is a 77 year old male with history of liver cirrhosis, hypertension, history of GI bleed, thrombocytopenia, insulin-dependent diabetes mellitus, GERD, who presents emergency department for chief concerns of altered mental status. MD assessment includes ICH, hepatic encephalopathy, hypothyroidism, hypomagnesemia, and physical deconditioning.    PT Comments  Pt in recliner on entry, no specific complaints today prior to nor during session. Pt remains delayed in response times and weak in legs, requires modA to come to standing, AMB steady but slow and lacking in ergonomics. Pt shows safe use of RW. Postural tendencies make upright standing quite difficult. Pt left up in chair at end of session, all needs met.    If plan is discharge home, recommend the following: A little help with walking and/or transfers;A little help with bathing/dressing/bathroom;Assistance with cooking/housework;Assist for transportation   Can travel by private vehicle     No  Equipment Recommendations  None recommended by PT    Recommendations for Other Services       Precautions / Restrictions Precautions Precautions: Fall Restrictions Weight Bearing Restrictions: No     Mobility  Bed Mobility               General bed mobility comments: pt upright in recliner upon arrival and at conclusion of session.    Transfers Overall transfer level: Needs assistance Equipment used: Rolling walker (2 wheels) Transfers: Sit to/from Stand Sit to Stand: Mod assist           General transfer comment: minA from elevated surface    Ambulation/Gait Ambulation/Gait assistance: Contact guard assist Gait Distance (Feet): 40 Feet Assistive device: Rolling walker (2 wheels) Gait Pattern/deviations: Trunk flexed, Step-to pattern Gait velocity:  0.24m/s         Stairs             Wheelchair Mobility     Tilt Bed    Modified Rankin (Stroke Patients Only)       Balance                                            Cognition Arousal: Alert Behavior During Therapy: Flat affect                                   General Comments: delayed response, delayed inititation        Exercises Other Exercises Other Exercises: Seated LAQ 1x12 bilat Other Exercises: STS from elevated EOB 1x5 c minA lift assist Other Exercises: AMB overground with RW 10ft (2 bouts with steated rest between)    General Comments        Pertinent Vitals/Pain Pain Assessment Pain Assessment: No/denies pain    Home Living                          Prior Function            PT Goals (current goals can now be found in the care plan section) Acute Rehab PT Goals Patient Stated Goal: get stronger PT Goal Formulation: With patient Time For Goal Achievement: 10/31/23 Potential to Achieve Goals: Good Progress towards PT goals: Progressing toward goals    Frequency  Min 1X/week      PT Plan      Co-evaluation              AM-PAC PT "6 Clicks" Mobility   Outcome Measure  Help needed turning from your back to your side while in a flat bed without using bedrails?: A Lot Help needed moving from lying on your back to sitting on the side of a flat bed without using bedrails?: A Lot Help needed moving to and from a bed to a chair (including a wheelchair)?: A Lot Help needed standing up from a chair using your arms (e.g., wheelchair or bedside chair)?: A Lot Help needed to walk in hospital room?: A Little Help needed climbing 3-5 steps with a railing? : A Lot 6 Click Score: 13    End of Session Equipment Utilized During Treatment: Gait belt Activity Tolerance: Patient tolerated treatment well;Patient limited by fatigue Patient left: in chair;with call bell/phone within  reach;with chair alarm set Nurse Communication: Mobility status (wet socks, wet TED) PT Visit Diagnosis: Muscle weakness (generalized) (M62.81);Difficulty in walking, not elsewhere classified (R26.2) Pain - Right/Left: Left Pain - part of body: Knee     Time: 1191-4782 PT Time Calculation (min) (ACUTE ONLY): 20 min  Charges:    $Therapeutic Exercise: 8-22 mins PT General Charges $$ ACUTE PT VISIT: 1 Visit                    12:06 PM, 10/24/23 Rosamaria Lints, PT, DPT Physical Therapist - [facility]  [phone] (ASCOM)    Alaina Donati C 10/24/2023, 12:05 PM

## 2023-10-24 NOTE — Plan of Care (Signed)
PMT is shadowing as goals are set.  Per notes, advanced directive was completed and notarized as patient requested.  Per notes patient is discharging today.  Recommend outpatient palliative. PMT will sign off at this time.  Please reconsult if needs arise.

## 2023-10-24 NOTE — TOC Progression Note (Signed)
Transition of Care [facility]) - Progression Note    Patient Details  Name: Joshua Berry MRN: [id number] Date of Birth: 1946/02/11  [facility]) CM/SW Contact  Margarito Liner, LCSW Phone Number: 10/24/2023, 12:26 PM  Clinical Narrative: SNF is still awaiting authorization confirmation from the [facility]. CSW left voicemails for Coralee North, RN and Peter Minium, [place]. Also emailed the authorization email address.    Expected Discharge Plan: Skilled Nursing Facility    Expected Discharge Plan and Services         Expected Discharge Date: 10/24/23                                     Social Determinants of Health (SDOH) Interventions SDOH Screenings   Food Insecurity: Patient Declined (10/17/2023)  Housing: Patient Declined (10/17/2023)  Transportation Needs: Patient Declined (10/17/2023)  Utilities: Patient Declined (10/17/2023)  Depression (PHQ2-9): Low Risk  (04/24/2023)  Financial Resource Strain: Low Risk  (08/06/2023)   Received from [facility]  Social Connections: Unknown (04/29/2022)   Received from [facility]  Tobacco Use: Medium Risk (10/17/2023)    Readmission Risk Interventions    09/15/2023    8:54 AM 08/14/2023    2:18 PM 07/15/2023    8:52 AM  Readmission Risk Prevention Plan  Transportation Screening Complete Complete Complete  PCP or Specialist Appt within 3-5 Days Complete Complete Complete  HRI or Home Care Consult Complete Complete   Social Work Consult for Recovery Care Planning/Counseling Not Complete Complete Complete  Palliative Care Screening Not Applicable Not Applicable Not Applicable  Medication Review Oceanographer) Not Complete Complete Referral to Pharmacy  Med Review Comments Patient confused and disoriented.

## 2023-10-24 NOTE — TOC Progression Note (Signed)
Transition of Care [facility]) - Progression Note    Patient Details  Name: Joshua Berry MRN: [id number] Date of Birth: Dec 30, 1945  Transition of Care [facility]) CM/SW Contact  Truddie Hidden, RN Phone Number: 10/24/2023, 4:07 PM  Clinical Narrative:    Sherron Monday with Gavin Pound at [facility]. Patient was not approved for [facility]. [facility] have been placed on hold since 10/31. Patient will need to be placed at another [place]  facility.    Expected Discharge Plan: Skilled Nursing Facility    Expected Discharge Plan and Services         Expected Discharge Date: 10/24/23                                     Social Determinants of Health (SDOH) Interventions SDOH Screenings   Food Insecurity: Patient Declined (10/17/2023)  Housing: Patient Declined (10/17/2023)  Transportation Needs: Patient Declined (10/17/2023)  Utilities: Patient Declined (10/17/2023)  Depression (PHQ2-9): Low Risk  (04/24/2023)  Financial Resource Strain: Low Risk  (08/06/2023)   Received from [facility]  Social Connections: Unknown (04/29/2022)   Received from [facility]  Tobacco Use: Medium Risk (10/17/2023)    Readmission Risk Interventions    09/15/2023    8:54 AM 08/14/2023    2:18 PM 07/15/2023    8:52 AM  Readmission Risk Prevention Plan  Transportation Screening Complete Complete Complete  PCP or Specialist Appt within 3-5 Days Complete Complete Complete  HRI or Home Care Consult Complete Complete   Social Work Consult for Recovery Care Planning/Counseling Not Complete Complete Complete  Palliative Care Screening Not Applicable Not Applicable Not Applicable  Medication Review Oceanographer) Not Complete Complete Referral to Pharmacy  Med Review Comments Patient confused and disoriented.

## 2023-10-25 DIAGNOSIS — S065XAA Traumatic subdural hemorrhage with loss of consciousness status unknown, initial encounter: Secondary | ICD-10-CM | POA: Diagnosis not present

## 2023-10-25 LAB — GLUCOSE, CAPILLARY
Glucose-Capillary: 140 mg/dL — ABNORMAL HIGH (ref 70–99)
Glucose-Capillary: 271 mg/dL — ABNORMAL HIGH (ref 70–99)
Glucose-Capillary: 158 mg/dL — ABNORMAL HIGH (ref 70–99)
Glucose-Capillary: 201 mg/dL — ABNORMAL HIGH (ref 70–99)

## 2023-10-25 MED ORDER — CHLORHEXIDINE GLUCONATE CLOTH 2 % EX PADS
6.0000 | MEDICATED_PAD | Freq: Every day | CUTANEOUS | Status: DC
  Administered 2023-10-26 – 2023-10-30 (×3): 6 via TOPICAL

## 2023-10-25 MED ORDER — MAGNESIUM OXIDE -MG SUPPLEMENT 400 (240 MG) MG PO TABS
800.0000 mg | ORAL_TABLET | Freq: Two times a day (BID) | ORAL | Status: DC
  Administered 2023-10-25 – 2023-11-10 (×33): 800 mg via ORAL
  Filled 2023-10-25 (×33): qty 2

## 2023-10-25 NOTE — Plan of Care (Signed)
  Problem: Coping: Goal: Ability to adjust to condition or change in health will improve Outcome: Progressing   Problem: Fluid Volume: Goal: Ability to maintain a balanced intake and output will improve Outcome: Progressing   Problem: Health Behavior/Discharge Planning: Goal: Ability to manage health-related needs will improve Outcome: Progressing   Problem: Metabolic: Goal: Ability to maintain appropriate glucose levels will improve Outcome: Progressing   Problem: Nutritional: Goal: Maintenance of adequate nutrition will improve Outcome: Progressing   Problem: Skin Integrity: Goal: Risk for impaired skin integrity will decrease Outcome: Progressing   Problem: Education: Goal: Knowledge of General Education information will improve Description: Including pain rating scale, medication(s)/side effects and non-pharmacologic comfort measures Outcome: Progressing   Problem: Health Behavior/Discharge Planning: Goal: Ability to manage health-related needs will improve Outcome: Progressing   Problem: Clinical Measurements: Goal: Ability to maintain clinical measurements within normal limits will improve Outcome: Progressing Goal: Will remain free from infection Outcome: Progressing Goal: Diagnostic test results will improve Outcome: Progressing Goal: Respiratory complications will improve Outcome: Progressing   Problem: Activity: Goal: Risk for activity intolerance will decrease Outcome: Progressing   Problem: Nutrition: Goal: Adequate nutrition will be maintained Outcome: Progressing   Problem: Pain Management: Goal: General experience of comfort will improve Outcome: Progressing   Problem: Skin Integrity: Goal: Risk for impaired skin integrity will decrease Outcome: Progressing

## 2023-10-25 NOTE — Progress Notes (Signed)
Progress Note   [name] MWU:[id number] DOB: 06-16-1946 DOA: 10/16/2023     7 DOS: the patient was seen and examined on 10/25/2023   Brief hospital course: 77yo with h/o NASH cirrhosis, HTN, and DM who presented on 10/31 with AMS.  NH4 89, improving slowly with high doses of lactulose.  CT head showed mixed density subdural hematomas overlying bilateral cerebral hemispheres, stable since 09/15/2023; however there are hyperdense components within both subdural hematoma suggesting interval hemorrhage into these collections.  Repeat CT head after 6 hours did not show any changes.  Patient seen by neurosurgery, no need for surgery intervention.  Persistently elevated NH4 levels, waxing and waning confusion.  Assessment and Plan:  *See discharge summary from 11/8 - due to [facility] glitch, he did not discharge as anticipated on 11/8.   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 -Presented with acute onset of AMS  -Ammonia 89 on presentation, improved to 48 yesterday with cognitive clearing -No apparent s/sx of infection -He has been treated for encephalopathy with lactulose titrated to 2-4 soft stools/day - currently on 45g q4h with hold parameters -Continue Rifaximin   Cirrhosis -MELD/MELD-Na score is 12/15, with a mortality rate of 6.6% -Platelets 45, generally stable -Continue Lasix   Intracranial hemorrhage -Neurosurgery consulted -Repeat CT of the head without contrast in 6 hours showed no change -No surgical intervention is needed   Uncontrolled type 2 diabetes with hyperglycemia -A1c 5.9, good  control -Resume Glucophage   GERD without esophagitis -Continue Protonix   Hypothyroidism -Continue levothyroxine  Hypomagnesemia -Takes 800 mg PO magnesium BID at home -Will check Mag++ level in AM   DNR -A gold out of facility DNR form is signed and in his chart         Consultants: Neurosurgery Palliative care PT/OT   Procedures: None   Antibiotics: None 30 Day Unplanned Readmission Risk Score    Flowsheet Row ED to Hosp-Admission (Current) from 10/16/2023 in [facility]  30 Day Unplanned Readmission Risk Score (%) 43.12 Filed at 10/25/2023 0800       This score is the patient's risk of an unplanned readmission within 30 days of being discharged (0 -100%). The score is based on dignosis, age, lab data, medications, orders, and past utilization.   Low:  0-14.9   Medium: 15-21.9   High: 22-29.9   Extreme: 30 and above           Subjective: Frustrated that he could not go to facility.  More clear today.   Objective: Vitals:   10/25/23 0815 10/25/23 1334  BP: (!) 108/55 (!) 108/56  Pulse: (!) 58 63  Resp: 16 16  Temp: 98.3 F (36.8 C) 98.1 F (36.7 C)  SpO2: 100% 98%   No intake or output data in the 24 hours ending 10/25/23 1405  Filed Weights   10/17/23 2035 10/24/23 0552  Weight: 98.1 kg 97.4 kg    Exam:  General:  Appears calm and comfortable and is in NAD Eyes:  EOMI, normal lids, iris ENT:  grossly normal hearing, lips & tongue, mmm Neck:  no LAD, masses or thyromegaly Cardiovascular:  RRR, no m/r/g. No LE  edema.  Respiratory:   CTA bilaterally with no wheezes/rales/rhonchi.  Normal respiratory effort. Abdomen:  soft, NT, ND Skin:  no rash or induration seen on limited exam Musculoskeletal:  grossly normal tone BUE/BLE, good ROM, no bony abnormality Psychiatric:  blunted mood and affect, speech fluent and appropriate, AOx3 Neurologic:  CN 2-12 grossly intact, moves all extremities in coordinated fashion  Data  Reviewed: I have reviewed the patient's lab results since admission.  Pertinent labs for today include:   None     Family Communication: None present; I spoke with his daughter by telephone  Disposition: Status is: Inpatient Remains inpatient appropriate because: unsafe disposition    *Appro[facility] by [facility] for SNF on 11/7, discharge held for medical reasons for 1 additional day *Discharged on 11/8 but then he was "not approved for [facility]" because "all new admissions to [facility] have been placed on hold since 10/31".  However, patient was already at [facility] and so possibly can return there anyway?   *TOC confirmed with [facility] on 11/9 that patient cannot return - there is a hold on admissions for veterans. Confirmed that he was there previously but under short term rehab-he was not a long term patient; will send out FL2 to additional [facility] facilities for review        Time spent: 35 minutes  Unresulted Labs (From admission, onward)     Start     Ordered   10/26/23 0500  Comprehensive metabolic panel  Tomorrow morning,   R       Question:  Specimen collection method  Answer:  Lab=Lab collect   10/25/23 1342   10/26/23 0500  Ammonia  Tomorrow morning,   R       Question:  Specimen collection method  Answer:  Lab=Lab collect   10/25/23 1342   10/26/23 0500  CBC with Differential/Platelet  Tomorrow morning,   R       Question:  Specimen collection method  Answer:  Lab=Lab collect   10/25/23 1342             Author: Jonah Blue, MD 10/25/2023 2:05 PM  For on call review [email or web address].

## 2023-10-25 NOTE — Progress Notes (Signed)
Report called to receiving 1C RN and all questions/concerns addressed. Request for transport placed by Charge RN. Patient transferred to room 108 via bed.  Lamonte Richer, RN

## 2023-10-26 DIAGNOSIS — S065XAA Traumatic subdural hemorrhage with loss of consciousness status unknown, initial encounter: Secondary | ICD-10-CM | POA: Diagnosis not present

## 2023-10-26 LAB — CBC WITH DIFFERENTIAL/PLATELET
Abs Immature Granulocytes: 0 10*3/uL (ref 0.00–0.07)
Basophils Absolute: 0 10*3/uL (ref 0.0–0.1)
Basophils Relative: 1 %
Eosinophils Absolute: 0.1 10*3/uL (ref 0.0–0.5)
Eosinophils Relative: 5 %
HCT: 26.4 % — ABNORMAL LOW (ref 39.0–52.0)
Hemoglobin: 9.1 g/dL — ABNORMAL LOW (ref 13.0–17.0)
Immature Granulocytes: 0 %
Lymphocytes Relative: 28 %
Lymphs Abs: 0.6 10*3/uL — ABNORMAL LOW (ref 0.7–4.0)
MCH: 32 pg (ref 26.0–34.0)
MCHC: 34.5 g/dL (ref 30.0–36.0)
MCV: 93 fL (ref 80.0–100.0)
Monocytes Absolute: 0.3 10*3/uL (ref 0.1–1.0)
Monocytes Relative: 12 %
Neutro Abs: 1.1 10*3/uL — ABNORMAL LOW (ref 1.7–7.7)
Neutrophils Relative %: 54 %
Platelets: 43 10*3/uL — ABNORMAL LOW (ref 150–400)
RBC: 2.84 MIL/uL — ABNORMAL LOW (ref 4.22–5.81)
RDW: 15.3 % (ref 11.5–15.5)
WBC: 2.1 10*3/uL — ABNORMAL LOW (ref 4.0–10.5)
nRBC: 0 % (ref 0.0–0.2)

## 2023-10-26 LAB — COMPREHENSIVE METABOLIC PANEL
ALT: 27 U/L (ref 0–44)
AST: 39 U/L (ref 15–41)
Albumin: 2.3 g/dL — ABNORMAL LOW (ref 3.5–5.0)
Alkaline Phosphatase: 96 U/L (ref 38–126)
Anion gap: 6 (ref 5–15)
BUN: 12 mg/dL (ref 8–23)
CO2: 24 mmol/L (ref 22–32)
Calcium: 8.6 mg/dL — ABNORMAL LOW (ref 8.9–10.3)
Chloride: 108 mmol/L (ref 98–111)
Creatinine, Ser: 0.65 mg/dL (ref 0.61–1.24)
GFR, Estimated: >60 mL/min (ref >60)
Glucose, Bld: 145 mg/dL — ABNORMAL HIGH (ref 70–99)
Potassium: 3.7 mmol/L (ref 3.5–5.1)
Sodium: 138 mmol/L (ref 135–145)
Total Bilirubin: 0.7 mg/dL (ref <1.2)
Total Protein: 5.4 g/dL — ABNORMAL LOW (ref 6.5–8.1)

## 2023-10-26 LAB — GLUCOSE, CAPILLARY
Glucose-Capillary: 144 mg/dL — ABNORMAL HIGH (ref 70–99)
Glucose-Capillary: 228 mg/dL — ABNORMAL HIGH (ref 70–99)
Glucose-Capillary: 149 mg/dL — ABNORMAL HIGH (ref 70–99)
Glucose-Capillary: 196 mg/dL — ABNORMAL HIGH (ref 70–99)

## 2023-10-26 LAB — AMMONIA: Ammonia: 180 umol/L — ABNORMAL HIGH (ref 9–35)

## 2023-10-26 MED ORDER — LIDOCAINE 5 % EX PTCH
1.0000 | MEDICATED_PATCH | CUTANEOUS | Status: DC
  Administered 2023-10-26 – 2023-11-08 (×12): 1 via TRANSDERMAL
  Filled 2023-10-26 (×17): qty 1

## 2023-10-26 NOTE — Progress Notes (Signed)
Progress Note   [name] [id number]:[id number] DOB: 05-Aug-1946 DOA: 10/16/2023     8 DOS: the patient was seen and examined on 10/26/2023   Brief hospital course: 77yo with h/o NASH cirrhosis, HTN, and DM who presented on 10/31 with AMS.  NH4 89, improving slowly with high doses of lactulose.  CT head showed mixed density subdural hematomas overlying bilateral cerebral hemispheres, stable since 09/15/2023; however there are hyperdense components within both subdural hematoma suggesting interval hemorrhage into these collections.  Repeat CT head after 6 hours did not show any changes.  Patient seen by neurosurgery, no need for surgery intervention.  Persistently elevated NH4 levels, waxing and waning confusion.  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 -Presented with acute onset of AMS  -Ammonia 89 on presentation, improved to 48, elevated again today but with persistent cognitive clearing -No apparent s/sx of infection -He has been treated for encephalopathy with lactulose titrated to 2-4 soft stools/day - currently on 45g q4h with hold parameters, appears to be profoundly sensitive to missed doses of this medication  -Continue Rifaximin   Cirrhosis -MELD/MELD-Na score is 12/15, with a mortality rate of 6.6% -Platelets 45, generally stable -Continue Lasix   Intracranial hemorrhage -Neurosurgery consulted -Repeat CT of the head without contrast in 6 hours showed no change -No surgical intervention is needed   Uncontrolled type 2 diabetes with hyperglycemia -A1c 5.9, good  control -Resume Glucophage   GERD without esophagitis -Continue Protonix   Hypothyroidism -Continue levothyroxine   Hypomagnesemia -Takes 800 mg PO magnesium BID at home -Will check Mag++ level in AM   DNR -A gold out of facility DNR form is signed and in his chart         Consultants: Neurosurgery Palliative care PT/OT   Procedures: None   Antibiotics: None 30 Day Unplanned Readmission Risk Score     30 Day Unplanned Readmission Risk Score    Flowsheet Row ED to Hosp-Admission (Current) from 10/16/2023 in [facility] 1C MEDICAL TELEMETRY  30 Day Unplanned Readmission Risk Score (%) 44.1 Filed at 10/26/2023 0401       This score is the patient's risk of an unplanned readmission within 30 days of being discharged (0 -100%). The score is based on dignosis, age, lab data, medications, orders, and past utilization.   Low:  0-14.9   Medium: 15-21.9   High: 22-29.9   Extreme: 30 and above           Subjective: Feeling good today, very clear cognitively.   Objective: Vitals:   10/26/23 0506 10/26/23 0812  BP: (!) 102/57 107/63  Pulse: 63 61  Resp: 16 20  Temp: (!) 97.4 F (36.3 C) 97.8 F (36.6 C)  SpO2: 100% 100%    Intake/Output Summary (Last 24 hours) at 10/26/2023 1257 Last data filed at 10/25/2023 1827 Gross per 24 hour  Intake 150 ml  Output 250 ml  Net -100 ml   Filed Weights   10/17/23 2035 10/24/23 0552  Weight: 98.1 kg 97.4 kg    Exam:  General:  Appears calm and comfortable and is in NAD Eyes:  EOMI, normal lids, iris ENT:  grossly normal hearing, lips & tongue, mmm Neck:  no LAD, masses or thyromegaly Cardiovascular:  RRR, no m/r/g. No LE edema.  Respiratory:   CTA bilaterally with no wheezes/rales/rhonchi.  Normal respiratory effort. Abdomen:  soft, NT, ND Skin:  no rash or induration seen on limited exam Musculoskeletal:  grossly normal tone BUE/BLE, good ROM, no bony abnormality Psychiatric:  grossly  normal mood and affect, speech fluent and appropriate, AOx3 Neurologic:  CN 2-12 grossly intact, moves all extremities in coordinated fashion  Data Reviewed: I have reviewed the patient's lab results since admission.  Pertinent labs for today include:   Glucose 145 NH4 180 WBC 2.1 Hgb 9.1 Platelets 43     Family Communication: None present; I spoke with his son by telephone  Disposition: Status is: Inpatient Remains inpatient appropriate because:      *Approved by [facility] for SNF on 11/7, discharge held for medical reasons for 1 additional day *Discharged on 11/8 but then he was "not approved for [facility]" because "all new admissions to [facility] have been placed on hold since 10/31".  However, patient was already at [facility] and so possibly can return there anyway?   *TOC confirmed with [facility] on 11/9 that patient cannot return - there is a hold on admissions for veterans. Confirmed that he was there previously but under short term rehab-he was not a long term patient; will send out FL2 to additional [facility] facilities for review  *11/10 awaiting placement - will need to send out FL2 tomorrow and try to get bed acceptance as soon as possible           Time spent: 35 minutes  Unresulted Labs (From admission, onward)     Start     Ordered   10/27/23 0500  Ammonia  Tomorrow morning,   R       Question:  Specimen collection method  Answer:  Lab=Lab collect   10/26/23 0740             Author: Jonah Blue, MD 10/26/2023 12:57 PM  For on call review [email or web address].

## 2023-10-27 DIAGNOSIS — S065XAA Traumatic subdural hemorrhage with loss of consciousness status unknown, initial encounter: Secondary | ICD-10-CM | POA: Diagnosis not present

## 2023-10-27 LAB — GLUCOSE, CAPILLARY
Glucose-Capillary: 139 mg/dL — ABNORMAL HIGH (ref 70–99)
Glucose-Capillary: 329 mg/dL — ABNORMAL HIGH (ref 70–99)
Glucose-Capillary: 194 mg/dL — ABNORMAL HIGH (ref 70–99)
Glucose-Capillary: 218 mg/dL — ABNORMAL HIGH (ref 70–99)

## 2023-10-27 LAB — AMMONIA: Ammonia: 130 umol/L — ABNORMAL HIGH (ref 9–35)

## 2023-10-27 LAB — MAGNESIUM: Magnesium: 1.7 mg/dL (ref 1.7–2.4)

## 2023-10-27 NOTE — Progress Notes (Signed)
Occupational Therapy Treatment Patient Details Name: Joshua Berry MRN: [id number] DOB: [birth year] Today's Date: 10/27/2023   History of present illness Joshua Berry is a 77 year old male with history of liver cirrhosis, hypertension, history of GI bleed, thrombocytopenia, insulin-dependent diabetes mellitus, GERD, who presents emergency department for chief concerns of altered mental status. MD assessment includes ICH, hepatic encephalopathy, hypothyroidism, hypomagnesemia, and physical deconditioning.   OT comments  Joshua Berry was seen for OT treatment on this date. Upon arrival to room pt seated in chair, agreeable to tx. Pt requires MOD A + RW sit<>stand x3 trials and chair>bed t/f. Pt making good progress toward goals, will continue to follow POC. Discharge recommendation remains appropriate.        If plan is discharge home, recommend the following:  A lot of help with bathing/dressing/bathroom;Help with stairs or ramp for entrance;A lot of help with walking and/or transfers   Equipment Recommendations  Other (comment) (defer)    Recommendations for Other Services      Precautions / Restrictions Precautions Precautions: Fall Restrictions Weight Bearing Restrictions: No       Mobility Bed Mobility Overal bed mobility: Needs Assistance Bed Mobility: Sit to Supine       Sit to supine: Min assist        Transfers Overall transfer level: Needs assistance Equipment used: Rolling walker (2 wheels) Transfers: Sit to/from Stand Sit to Stand: Mod assist           General transfer comment: x3 standing attempts     Balance Overall balance assessment: Needs assistance Sitting-balance support: Feet supported, Bilateral upper extremity supported Sitting balance-Leahy Scale: Good     Standing balance support: Bilateral upper extremity supported, Reliant on assistive device for balance, During functional activity Standing balance-Leahy Scale: Poor                              ADL either performed or assessed with clinical judgement   ADL Overall ADL's : Needs assistance/impaired                                       General ADL Comments: MAX A doff B shoes in sitting      Cognition Arousal: Alert Behavior During Therapy: Flat affect Overall Cognitive Status: No family/caregiver present to determine baseline cognitive functioning                                 General Comments: delayed response, delayed inititation, follows simple commands with extra time throughout session                   Pertinent Vitals/ Pain       Pain Assessment Pain Assessment: No/denies pain   Frequency  Min 1X/week        Progress Toward Goals  OT Goals(current goals can now be found in the care plan section)  Progress towards OT goals: Progressing toward goals  Acute Rehab OT Goals Patient Stated Goal: to go home OT Goal Formulation: With patient Time For Goal Achievement: 11/01/23 Potential to Achieve Goals: Good ADL Goals Pt Will Perform Grooming: with set-up;with supervision;standing Pt Will Perform Lower Body Dressing: with contact guard assist;sit to/from stand;with adaptive equipment Pt Will Transfer to Toilet: with modified independence;ambulating;regular height toilet  Plan  Co-evaluation                 AM-PAC OT "6 Clicks" Daily Activity     Outcome Measure   Help from another person eating meals?: None Help from another person taking care of personal grooming?: A Little Help from another person toileting, which includes using toliet, bedpan, or urinal?: A Lot Help from another person bathing (including washing, rinsing, drying)?: A Lot Help from another person to put on and taking off regular upper body clothing?: A Little Help from another person to put on and taking off regular lower body clothing?: A Lot 6 Click Score: 16    End of Session    OT Visit Diagnosis:  Other abnormalities of gait and mobility (R26.89);Muscle weakness (generalized) (M62.81)   Activity Tolerance Patient tolerated treatment well   Patient Left in bed;with call bell/phone within reach;with bed alarm set   Nurse Communication Mobility status;Patient requests pain meds        Time: 4166-0630 OT Time Calculation (min): 11 min  Charges: OT General Charges $OT Visit: 1 Visit OT Treatments $Self Care/Home Management : 8-22 mins  Kathie Dike, M.S. OTR/L  10/27/23, 4:17 PM  ascom [phone]

## 2023-10-27 NOTE — Progress Notes (Signed)
OT Cancellation Note  Patient Details Name: ANVITH HARNISCH MRN: [id number] DOB: June 09, 1946   Cancelled Treatment:    Reason Eval/Treat Not Completed: Fatigue/lethargy limiting ability to participate. Chart reviewed, on arrival pt repeatedly refusing all mobility or seated ADL attempts citing fatigue. Pleasant and agreeable to next date.   Kathie Dike, M.S. OTR/L  10/27/23, 12:45 PM  ascom [phone]

## 2023-10-27 NOTE — Care Management Important Message (Signed)
Important Message  Patient Details  Name: Joshua Berry MRN: [id number] Date of Birth: 1946-11-22   Important Message Given:  Yes - Medicare and Tricare IM     [name] 10/27/2023, 9:53 AM

## 2023-10-27 NOTE — TOC Progression Note (Addendum)
Transition of Care [facility]) - Progression Note    Patient Details  Name: Joshua Berry MRN: [id number] Date of Birth: 19-May-1946  Transition of Care [facility]) CM/SW Contact  Allena Katz, LCSW Phone Number: 10/27/2023, 2:47 PM  Clinical Narrative:   CSW spoke with wife regarding bed offers and inability to return to [facility]. Wife reports she would like [place] to call her daughter as she makes most of these decisions and wants me to run it by her. CSW to speak with daughter.  2:56pm Message left with daughter [name].  3:19pm CSW spoke with daughter who wants to see if [facility] would be able to make an exception. CSW spoke with [facility] and they are reporting that they cannot take any more [facility] referrals that they are at their max.  Expected Discharge Plan: Skilled Nursing Facility    Expected Discharge Plan and Services         Expected Discharge Date: 10/24/23                                     Social Determinants of Health (SDOH) Interventions SDOH Screenings   Food Insecurity: Patient Declined (10/17/2023)  Housing: Patient Declined (10/17/2023)  Transportation Needs: Patient Declined (10/17/2023)  Utilities: Patient Declined (10/17/2023)  Depression (PHQ2-9): Low Risk  (04/24/2023)  Financial Resource Strain: Low Risk  (08/06/2023)   Received from [facility]  Social Connections: Unknown (04/29/2022)   Received from [facility], [facility]  Tobacco Use: Medium Risk (10/17/2023)    Readmission Risk Interventions    09/15/2023    8:54 AM 08/14/2023    2:18 PM 07/15/2023    8:52 AM  Readmission Risk Prevention Plan  Transportation Screening Complete Complete Complete  PCP or Specialist Appt within 3-5 Days Complete Complete Complete  HRI or Home Care Consult Complete Complete   Social Work Consult for Recovery Care Planning/Counseling Not Complete Complete Complete  Palliative Care Screening Not Applicable Not Applicable Not Applicable  Medication Review Furniture conservator/restorer) Not Complete Complete Referral to Pharmacy  Med Review Comments Patient confused and disoriented.

## 2023-10-27 NOTE — Progress Notes (Signed)
Progress Note   [name] HQI:[id number] DOB: 1946/08/23 DOA: 10/16/2023     9 DOS: the patient was seen and examined on 10/27/2023   Brief hospital course: 77yo with h/o NASH cirrhosis, HTN, and DM who presented on 10/31 with AMS.  NH4 89, improving slowly with high doses of lactulose.  CT head showed mixed density subdural hematomas overlying bilateral cerebral hemispheres, stable since 09/15/2023; however there are hyperdense components within both subdural hematoma suggesting interval hemorrhage into these collections.  Repeat CT head after 6 hours did not show any changes.  Patient seen by neurosurgery, no need for surgery intervention.  Persistently elevated NH4 levels, waxing and waning confusion.  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 -Presented with acute onset of AMS  -Ammonia 89 on presentation, improved to 48, elevated again today but with persistent cognitive clearing -No apparent s/sx of infection -He has been treated for encephalopathy with lactulose titrated to 2-4 soft stools/day - currently on 45g q4h with hold parameters, appears to be profoundly sensitive to missed doses of this medication  -Continue Rifaximin -Varying NH4 levels do not appear to correlate directly with mental status; will hold on further checks for now and follow him clinically   Cirrhosis -MELD/MELD-Na score is 12/15, with a mortality rate of 6.6% -Platelets 45, generally stable -Continue Lasix   Intracranial hemorrhage -Neurosurgery consulted -Repeat CT of the head without contrast  in 6 hours showed no change -No surgical intervention is needed   Uncontrolled type 2 diabetes with hyperglycemia -A1c 5.9, good control -Resume Glucophage   GERD without esophagitis -Continue Protonix   Hypothyroidism -Continue levothyroxine   Hypomagnesemia -Takes 800 mg PO magnesium BID at home -Will check Mag++ level in AM   DNR -A gold out of facility DNR form is signed and in his chart         Consultants: Neurosurgery Palliative care PT/OT   Procedures: None   Antibiotics: None  30 Day Unplanned Readmission Risk Score    Flowsheet Row ED to Hosp-Admission (Current) from 10[facility]16/2023 in [facility] 1C MEDICAL TELEMETRY  30 Day Unplanned Readmission Risk Score (%) 41.37 Filed at 10/27/2023 0801       This score is the patient's risk of an unplanned readmission within 30 days of being discharged (0 -100%). The score is based on dignosis, age, lab data, medications, orders, and past utilization.   Low:  0-14.9   Medium: 15-21.9   High: 22-29.9   Extreme: 30 and above           Subjective: Feeling ok.  Daughter is at bedside and brought him a [employer] breakfast.  No concerns today.   Objective: Vitals:   10/27/23 0511 10/27/23 0738  BP: 119/62 (!) 101/56  Pulse: 66 (!) 59  Resp: 16 16  Temp: (!) 97.5 F (36.4 C) 98.8 F (37.1 C)  SpO2: 99% 98%   No intake or output data in the 24 hours ending 10/27/23 1106 Filed Weights   10/17/23 2035 10/24/23 0552  Weight: 98.1 kg 97.4 kg    Exam:  General:  Appears calm and comfortable and is in NAD Eyes:  EOMI, normal lids, iris ENT:  grossly normal hearing, lips & tongue, mmm Neck:  no LAD, masses or thyromegaly Cardiovascular:  RRR, no m/r/g. No LE edema.  Respiratory:   CTA bilaterally with no wheezes/rales/rhonchi.  Normal respiratory effort. Abdomen:  soft, NT, ND Skin:  no rash or induration seen on limited exam Musculoskeletal:  grossly normal tone BUE/BLE, good ROM,  no bony abnormality Psychiatric: pleasant mood and affect, speech fluent and appropriate, AOx3 Neurologic:  CN 2-12 grossly intact, moves all extremities in coordinated fashion  Data Reviewed: I have reviewed the patient's lab results since admission.  Pertinent labs for today include:   NH4 130, down from 180      Family Communication: Daughter was present throughout evaluation  Disposition: Status is: Inpatient Remains inpatient appropriate because: unsafe disposition   *Approved by [facility] for SNF on 11/7, discharge held for medical reasons for 1 additional day *Discharged on 11/8 but then he was "not approved for [facility]" because "all new admissions to [facility] have been placed on hold since 10/31".  However, patient was already at [facility] and so possibly can return there anyway?   *TOC confirmed with [facility] on 11/9 that patient cannot return - there is a hold on admissions for veterans. Confirmed that he was there previously but under short term rehab-he was not a long term patient; will send out FL2 to additional VA facilities for review  *11/10 awaiting placement - need to send out [facility] tomorrow and try to get bed acceptance as soon as possible *11/10 daughter prefers [facility] in [place] if there is nothing closer that the [place] will approve          Time spent: 35 minutes  Unresulted Labs (From admission, onward)    None        Author: Jonah Blue, MD 10/27/2023 11:06 AM  For on call review [email or web address].

## 2023-10-27 NOTE — Progress Notes (Signed)
Physical Therapy Treatment Patient Details Name: Joshua Berry MRN: [id number] DOB: 17-Nov-1946 Today's Date: 10/27/2023   History of Present Illness [name] is a 77 year old male with history of liver cirrhosis, hypertension, history of GI bleed, thrombocytopenia, insulin-dependent diabetes mellitus, GERD, who presents emergency department for chief concerns of altered mental status. MD assessment includes ICH, hepatic encephalopathy, hypothyroidism, hypomagnesemia, and physical deconditioning.    PT Comments  Pt seen for PT tx with pt received in bed in care of NT for peri hygiene. PT encouraged pt to transfer to standing EOB for peri hygiene. Pt requires assistance for sidelying>sitting & is able to transfer STS from various surfaces with mod assist. Pt presents with significant BLE weakness requiring increased assistance for transfers. Pt ambulates into hallway x 2 attempts with RW & CGA<>min assist with impaired gait pattern as noted below. During 2nd gait attempt pt requires ~3 standing rest breaks 2/2 BUE & overall fatigue, pt with trembling extremities by the time he returned to room. Will continue to follow pt acutely to progress mobility as able.    If plan is discharge home, recommend the following: Assistance with cooking/housework;Assist for transportation;A lot of help with walking and/or transfers;A little help with bathing/dressing/bathroom   Can travel by private vehicle     No  Equipment Recommendations  None recommended by PT    Recommendations for Other Services       Precautions / Restrictions Precautions Precautions: Fall Restrictions Weight Bearing Restrictions: No     Mobility  Bed Mobility   Bed Mobility: Sidelying to Sit   Sidelying to sit: Min assist, HOB elevated, Used rails            Transfers   Equipment used: Rolling walker (2 wheels) Transfers: Sit to/from Stand Sit to Stand: Mod assist           General transfer comment: STS  from EOB & recliner with cuing but poor ability to scoot out to edge of seat, pt with narrow base of support, assistance to power up to standing.    Ambulation/Gait Ambulation/Gait assistance: Min assist, Contact guard assist Gait Distance (Feet): 75 Feet (+ 100 ft) Assistive device: Rolling walker (2 wheels) Gait Pattern/deviations: Trunk flexed Gait velocity: decreased     General Gait Details: increased BLE hip flexion to compensate for decreased dorsiflexion BLE, decreased heel strike. One instance of assisting pt with maneuvering RW to avoid obstacle in hallway.   Stairs             Wheelchair Mobility     Tilt Bed    Modified Rankin (Stroke Patients Only)       Balance Overall balance assessment: Needs assistance Sitting-balance support: Feet supported, Bilateral upper extremity supported Sitting balance-Leahy Scale: Good     Standing balance support: Bilateral upper extremity supported, Reliant on assistive device for balance, During functional activity Standing balance-Leahy Scale: Poor                              Cognition Arousal: Alert Behavior During Therapy: Flat affect Overall Cognitive Status: No family/caregiver present to determine baseline cognitive functioning                                 General Comments: delayed response, delayed inititation, follows simple commands with extra time throughout session        Exercises  General Comments        Pertinent Vitals/Pain Pain Assessment Pain Assessment: Faces Faces Pain Scale: No hurt    Home Living                          Prior Function            PT Goals (current goals can now be found in the care plan section) Acute Rehab PT Goals Patient Stated Goal: get stronger PT Goal Formulation: With patient Time For Goal Achievement: 10/31/23 Potential to Achieve Goals: Good Progress towards PT goals: Progressing toward goals     Frequency           PT Plan      Co-evaluation              AM-PAC PT "6 Clicks" Mobility   Outcome Measure  Help needed turning from your back to your side while in a flat bed without using bedrails?: A Little Help needed moving from lying on your back to sitting on the side of a flat bed without using bedrails?: A Lot Help needed moving to and from a bed to a chair (including a wheelchair)?: A Little Help needed standing up from a chair using your arms (e.g., wheelchair or bedside chair)?: A Lot Help needed to walk in hospital room?: A Little Help needed climbing 3-5 steps with a railing? : A Lot 6 Click Score: 15    End of Session   Activity Tolerance: Patient tolerated treatment well;Patient limited by fatigue Patient left: in chair;with chair alarm set;with call bell/phone within reach Nurse Communication: Mobility status PT Visit Diagnosis: Muscle weakness (generalized) (M62.81);Difficulty in walking, not elsewhere classified (R26.2);Unsteadiness on feet (R26.81);Other abnormalities of gait and mobility (R26.89)     Time: 3875-6433 PT Time Calculation (min) (ACUTE ONLY): 27 min  Charges:    $Therapeutic Activity: 23-37 mins PT General Charges $$ ACUTE PT VISIT: 1 Visit                     Aleda Grana, PT, DPT 10/27/23, 10:52 AM   Sandi Mariscal 10/27/2023, 10:49 AM

## 2023-10-28 DIAGNOSIS — S065XAA Traumatic subdural hemorrhage with loss of consciousness status unknown, initial encounter: Secondary | ICD-10-CM | POA: Diagnosis not present

## 2023-10-28 LAB — GLUCOSE, CAPILLARY
Glucose-Capillary: 172 mg/dL — ABNORMAL HIGH (ref 70–99)
Glucose-Capillary: 251 mg/dL — ABNORMAL HIGH (ref 70–99)
Glucose-Capillary: 207 mg/dL — ABNORMAL HIGH (ref 70–99)
Glucose-Capillary: 153 mg/dL — ABNORMAL HIGH (ref 70–99)

## 2023-10-28 NOTE — Progress Notes (Signed)
Progress Note   [name]:[id number] DOB: 1946/12/07 DOA: 10/16/2023     10 DOS: the patient was seen and examined on 10/28/2023   Brief hospital course: 77yo with h/o NASH cirrhosis, HTN, and DM who presented on 10/31 with AMS.  NH4 89, improving slowly with high doses of lactulose.  CT head showed mixed density subdural hematomas overlying bilateral cerebral hemispheres, stable since 09/15/2023; however there are hyperdense components within both subdural hematoma suggesting interval hemorrhage into these collections.  Repeat CT head after 6 hours did not show any changes.  Patient seen by neurosurgery, no need for surgery intervention.  Persistently elevated NH4 levels, waxing and waning confusion.  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 -Presented with acute onset of AMS  -Ammonia 89 on presentation, improved to 48, elevated again today but with persistent cognitive clearing -No apparent s/sx of infection -He has been treated for encephalopathy with lactulose titrated to 2-4 soft stools/day - currently on 45g q4h with hold parameters, appears to be profoundly sensitive to missed doses of this medication  -Continue Rifaximin -Varying NH4 levels do not appear to correlate directly with mental status; will hold on further checks for now and follow him clinically   Cirrhosis -MELD/MELD-Na score is 12/15, with a mortality rate of 6.6% -Platelets 45, generally stable -Continue Lasix   Intracranial hemorrhage -Neurosurgery consulted -Repeat CT of the head without contrast  in 6 hours showed no change -No surgical intervention is needed   Uncontrolled type 2 diabetes with hyperglycemia -A1c 5.9, good control -Resume Glucophage   GERD without esophagitis -Continue Protonix   Hypothyroidism -Continue levothyroxine   Hypomagnesemia -Takes 800 mg PO magnesium BID at home -Will check Mag++ level in AM   DNR -A gold out of facility DNR form is signed and in his chart         Consultants: Neurosurgery Palliative care PT/OT   Procedures: None   Antibiotics: None  30 Day Unplanned Readmission Risk Score    Flowsheet Row ED to Hosp-Admission (Current) from 10[facility]16/2023 in [facility] 1C MEDICAL TELEMETRY  30 Day Unplanned Readmission Risk Score (%) 41.72 Filed at 10/28/2023 0401       This score is the patient's risk of an unplanned readmission within 30 days of being discharged (0 -100%). The score is based on dignosis, age, lab data, medications, orders, and past utilization.   Low:  0-14.9   Medium: 15-21.9   High: 22-29.9   Extreme: 30 and above           Subjective: Feeling ok today, no complaints.   Objective: Vitals:   10/27/23 2018 10/28/23 0303  BP: (!) 108/56 (!) 109/59  Pulse: 65 63  Resp: 18 18  Temp: 98.7 F (37.1 C) 98.8 F (37.1 C)  SpO2: 100% 99%   No intake or output data in the 24 hours ending 10/28/23 0741 Filed Weights   10/17/23 2035 10/24/23 0552  Weight: 98.1 kg 97.4 kg    Exam:  General:  Appears calm and comfortable and is in NAD Eyes:  EOMI, normal lids, iris ENT:  grossly normal hearing, lips & tongue, mmm  Neck:  no LAD, masses or thyromegaly Cardiovascular:  RRR, no m/r/g. No LE edema.  Respiratory:   CTA bilaterally with no wheezes/rales/rhonchi.  Normal respiratory effort. Abdomen:  soft, NT, ND Skin:  no rash or induration seen on limited exam Musculoskeletal:  grossly normal tone BUE/BLE, good ROM, no bony abnormality Psychiatric:  grossly normal mood and affect,  speech fluent and appropriate, AOx3 Neurologic:  CN 2-12 grossly intact, moves all extremities in coordinated fashion  Data Reviewed: I have reviewed the patient's lab results since admission.  Pertinent labs for today include:   Mag++ 1.7     Family Communication: None present today  Disposition: Status is: Inpatient Remains inpatient appropriate because: unsafe disposition     *Approved by [facility] for SNF on 11/7, discharge held for medical reasons for 1 additional day *[facility] on [facility]8 [facility]. Confirmed that he was there previously but under short term rehab-he was not a long term patient and cannot return for short-term rehab; will send out FL2 to additional VA facilities for review  *11/10 awaiting placement - will need to send out FL2 tomorrow and try to get bed acceptance as soon as possible *Will then have to wait for the VA to again approve the bed, which often takes several days   Time spent: 35 minutes  Unresulted Labs (From admission, onward)    None        Author: Jonah Blue, MD 10/28/2023 7:41 AM  For on call review [email or web address].

## 2023-10-28 NOTE — TOC Progression Note (Signed)
Transition of Care [facility]) - Progression Note    Patient Details  Name: Joshua Berry MRN: [id number] Date of Birth: [birth year]  Transition of Care [facility]) CM/SW Contact  Allena Katz, LCSW Phone Number: 10/28/2023, 2:47 PM  Clinical Narrative:   CSW spoke with Marilynne Halsted [phone] RN at [facility]. Darl Pikes reports that mr wildasin has been approved for short and long term VA benefits through any of their contracted facilities. CSW asked if any exceptions can be made for a more local facility. They gave me the number to Ronie Spies [phone] the [employer] case manager Harriett Sine reports they are unable to provide oversight for any veterans at any facility other than their own veterans.    CSW spoke with daughter to explain all of this. Daughter is going to look at some more options and get back with me.   Expected Discharge Plan: Skilled Nursing Facility    Expected Discharge Plan and Services         Expected Discharge Date: 10/24/23                                     Social Determinants of Health (SDOH) Interventions SDOH Screenings   Food Insecurity: Patient Declined (10/17/2023)  Housing: Patient Declined (10/17/2023)  Transportation Needs: Patient Declined (10/17/2023)  Utilities: Patient Declined (10/17/2023)  Depression (PHQ2-9): Low Risk  (04/24/2023)  Financial Resource Strain: Low Risk  (08/06/2023)   Received from [facility]  Social Connections: Unknown (04/29/2022)   Received from [facility]  Tobacco Use: Medium Risk (10/17/2023)    Readmission Risk Interventions    09/15/2023    8:54 AM 08/14/2023    2:18 PM 07/15/2023    8:52 AM  Readmission Risk Prevention Plan  Transportation Screening Complete Complete Complete  PCP or Specialist Appt within 3-5 Days Complete Complete Complete  HRI or Home Care Consult Complete Complete   Social Work Consult for Recovery Care Planning/Counseling Not Complete Complete Complete  Palliative Care  Screening Not Applicable Not Applicable Not Applicable  Medication Review Oceanographer) Not Complete Complete Referral to Pharmacy  Med Review Comments Patient confused and disoriented.

## 2023-10-29 DIAGNOSIS — D61818 Other pancytopenia: Secondary | ICD-10-CM | POA: Diagnosis not present

## 2023-10-29 DIAGNOSIS — R5381 Other malaise: Secondary | ICD-10-CM

## 2023-10-29 DIAGNOSIS — E782 Mixed hyperlipidemia: Secondary | ICD-10-CM

## 2023-10-29 DIAGNOSIS — S065XAA Traumatic subdural hemorrhage with loss of consciousness status unknown, initial encounter: Secondary | ICD-10-CM | POA: Diagnosis not present

## 2023-10-29 DIAGNOSIS — K7682 Hepatic encephalopathy: Secondary | ICD-10-CM | POA: Diagnosis not present

## 2023-10-29 LAB — GLUCOSE, CAPILLARY
Glucose-Capillary: 171 mg/dL — ABNORMAL HIGH (ref 70–99)
Glucose-Capillary: 201 mg/dL — ABNORMAL HIGH (ref 70–99)
Glucose-Capillary: 146 mg/dL — ABNORMAL HIGH (ref 70–99)
Glucose-Capillary: 138 mg/dL — ABNORMAL HIGH (ref 70–99)

## 2023-10-29 NOTE — Progress Notes (Signed)
Progress Note   [name] ZDG:[id number] DOB: 1946-01-21 DOA: 10/16/2023     11 DOS: the patient was seen and examined on 10/29/2023   Brief hospital course: Joshua Berry is a 77 yo with h/o NASH cirrhosis, HTN, and DM who presented on 10/31 with AMS.  NH4 89, improving slowly with high doses of lactulose.  CT head showed mixed density subdural hematomas overlying bilateral cerebral hemispheres, stable since 09/15/2023; however there are hyperdense components within both subdural hematoma suggesting interval hemorrhage into these collections.  Repeat CT head after 6 hours did not show any changes.  Patient seen by neurosurgery, no need for surgery intervention.  Persistently elevated NH4 levels, waxing and waning confusion.  Assessment and Plan: AMS Hepatic encephalopathy Chronic SDH Patient presenting with encephalopathy as evidenced by transient confusion and word-finding difficulty. This appears to have been associated with hepatic encephalopathy, although he also has acute on chronic SDH which may be contributing. Patient was admitted for ongoing evaluation and management.  He has been treated for encephalopathy with lactulose titrated to 2-4 soft stools/day - currently on 45g q4h with hold parameters, appears to be profoundly sensitive to missed doses of this medication  -Continue Rifaximin He is back to baseline and medically stable for discharge   Cirrhosis MELD/MELD-Na score is 12/15, with a mortality rate of 6.6% Platelets 45, generally stable Continue Lasix, Rifaximin, Lactulose.   Intracranial hemorrhage Repeat CT of the head without contrast in 6 hours showed no change No surgical intervention is needed per neurosurgery team.   Uncontrolled type 2 diabetes with hyperglycemia A1c 5.9, good control Continue Glucophage   GERD without esophagitis Continue Protonix   Hypothyroidism Continue levothyroxine   Hypomagnesemia Takes 800 mg PO magnesium BID at  home Monitor Mag++ level in AM   DNR A gold out of facility DNR form is signed and in his chart       Out of bed to chair. Incentive spirometry. Nursing supportive care. Fall, aspiration precautions. DVT prophylaxis   Code Status: Limited: Do not attempt resuscitation (DNR) -DNR-LIMITED -Do Not Intubate/DNI   Subjective: Patient is seen and examined today morning. He denies any complaints. Brother at bedside. He is awaiting placement.  Physical Exam: Vitals:   10/28/23 1948 10/29/23 0429 10/29/23 0747 10/29/23 1614  BP: (!) 106/58 116/61 118/62 119/64  Pulse: 64 61 (!) 58 62  Resp: 18 18 17 16   Temp: 98.4 F (36.9 C) 97.7 F (36.5 C) 97.7 F (36.5 C) 98.4 F (36.9 C)  TempSrc:      SpO2: 100% 98% 99% 99%  Weight:      Height:        General - Elderly Caucasian male, no apparent distress HEENT - PERRLA, EOMI, atraumatic head, non tender sinuses. Lung - Clear, diffuse rales, no rhonchi, wheezes. Heart - S1, S2 heard, no murmurs, rubs, trace pedal edema. Abdomen - Soft, non tender, bowel sounds good Neuro - Alert, awake and oriented x 3, non focal exam. Skin - Warm and dry.  Data Reviewed:      Latest Ref Rng & Units 10/26/2023    3:45 AM 10/24/2023    5:06 AM 10/23/2023    8:35 AM  CBC  WBC 4.0 - 10.5 K/uL 2.1  2.3  2.3   Hemoglobin 13.0 - 17.0 g/dL 9.1  9.7  9.5   Hematocrit 39.0 - 52.0 % 26.4  27.8  27.1   Platelets 150 - 400 K/uL 43  46  44  Latest Ref Rng & Units 10/26/2023    3:45 AM 10/24/2023    5:06 AM 10/23/2023    8:35 AM  BMP  Glucose 70 - 99 mg/dL 657  846  962   BUN 8 - 23 mg/dL 12  13  13    Creatinine 0.61 - 1.24 mg/dL 9.52  8.41  3.24   Sodium 135 - 145 mmol/L 138  139  134   Potassium 3.5 - 5.1 mmol/L 3.7  3.6  3.6   Chloride 98 - 111 mmol/L 108  110  104   CO2 22 - 32 mmol/L 24  22  24    Calcium 8.9 - 10.3 mg/dL 8.6  8.8  8.4    No results found.   Family Communication: Discussed with family, they understand and agree. All  questions answereed.    Disposition: Status is: Inpatient Remains inpatient appropriate because: awaiting SNF placement.  Planned Discharge Destination: Skilled nursing facility     Time spent: 32 minutes  Author: Marcelino Duster, MD 10/29/2023 4:41 PM Secure chat 7am to 7pm For on call review [email or web address].

## 2023-10-29 NOTE — Progress Notes (Signed)
Occupational Therapy Treatment Patient Details Name: Joshua Berry MRN: [id number] DOB: 1946/01/27 Today's Date: 10/29/2023   History of present illness Mr. Kadarious Risper is a 77 year old male with history of liver cirrhosis, hypertension, history of GI bleed, thrombocytopenia, insulin-dependent diabetes mellitus, GERD, who presents emergency department for chief concerns of altered mental status. MD assessment includes ICH, hepatic encephalopathy, hypothyroidism, hypomagnesemia, and physical deconditioning.   OT comments  Mr Hailes was seen for OT treatment on this date. Upon arrival to room pt reclined in bed, agreeable to tx. Pt requires MIN A bed mobility and MIN A + RW for ADL t/f ~65 ft. Requires BUE support on walker for balance, assist to don hat in standing. Pt making progress toward goals, will continue to follow POC. Discharge recommendation remains appropriate.        If plan is discharge home, recommend the following:  A lot of help with bathing/dressing/bathroom;Help with stairs or ramp for entrance;A lot of help with walking and/or transfers   Equipment Recommendations  Other (comment) (defer)    Recommendations for Other Services      Precautions / Restrictions Precautions Precautions: Fall Restrictions Weight Bearing Restrictions: No       Mobility Bed Mobility Overal bed mobility: Needs Assistance Bed Mobility: Sit to Supine, Supine to Sit     Supine to sit: Min assist Sit to supine: Min assist        Transfers Overall transfer level: Needs assistance Equipment used: Rolling walker (2 wheels) Transfers: Sit to/from Stand Sit to Stand: Min assist                 Balance Overall balance assessment: Needs assistance Sitting-balance support: Feet supported, Bilateral upper extremity supported Sitting balance-Leahy Scale: Good     Standing balance support: Bilateral upper extremity supported, Reliant on assistive device for balance Standing  balance-Leahy Scale: Fair                             ADL either performed or assessed with clinical judgement   ADL Overall ADL's : Needs assistance/impaired                                       General ADL Comments: MIN A + RW for ADL t/f ~65 ft. MAX A for LB access in sitting and don hat in standing      Cognition Arousal: Alert Behavior During Therapy: Flat affect Overall Cognitive Status: History of cognitive impairments - at baseline                                 General Comments: delayed response, delayed inititation, follows simple commands with extra time throughout session                   Pertinent Vitals/ Pain       Pain Assessment Pain Assessment: No/denies pain   Frequency  Min 1X/week        Progress Toward Goals  OT Goals(current goals can now be found in the care plan section)  Progress towards OT goals: Progressing toward goals  Acute Rehab OT Goals Patient Stated Goal: to go home OT Goal Formulation: With patient Time For Goal Achievement: 11/01/23 Potential to Achieve Goals: Good ADL Goals Pt Will Perform Grooming: with set-up;with  supervision;standing Pt Will Perform Lower Body Dressing: with contact guard assist;sit to/from stand;with adaptive equipment Pt Will Transfer to Toilet: with modified independence;ambulating;regular height toilet  Plan      Co-evaluation                 AM-PAC OT "6 Clicks" Daily Activity     Outcome Measure   Help from another person eating meals?: None Help from another person taking care of personal grooming?: A Little Help from another person toileting, which includes using toliet, bedpan, or urinal?: A Lot Help from another person bathing (including washing, rinsing, drying)?: A Lot Help from another person to put on and taking off regular upper body clothing?: A Little Help from another person to put on and taking off regular lower body  clothing?: A Lot 6 Click Score: 16    End of Session Equipment Utilized During Treatment: Rolling walker (2 wheels);Gait belt  OT Visit Diagnosis: Other abnormalities of gait and mobility (R26.89);Muscle weakness (generalized) (M62.81)   Activity Tolerance Patient tolerated treatment well   Patient Left in bed;with call bell/phone within reach;with bed alarm set   Nurse Communication          Time: 3295-1884 OT Time Calculation (min): 10 min  Charges: OT General Charges $OT Visit: 1 Visit OT Treatments $Therapeutic Activity: 8-22 mins  Kathie Dike, M.S. OTR/L  10/29/23, 3:23 PM  ascom [phone]

## 2023-10-29 NOTE — Progress Notes (Signed)
Physical Therapy Treatment Patient Details Name: Joshua Berry MRN: [id number] DOB: 1946-04-09 Today's Date: 10/29/2023   History of Present Illness [name] is a 77 year old male with history of liver cirrhosis, hypertension, history of GI bleed, thrombocytopenia, insulin-dependent diabetes mellitus, GERD, who presents emergency department for chief concerns of altered mental status. MD assessment includes ICH, hepatic encephalopathy, hypothyroidism, hypomagnesemia, and physical deconditioning.    PT Comments  Pt did not wish to do a more prolonged bout of ambulation today despite much encouragement and assurance of following with chair to allow for seated rest break PRN.  He needed modA to rise from recliner but from highly elevated BSC and grab bars he did rise with only CGA.  Pt said "Maybe tomorrow" when PT asked if he could clean up after his BM.  Pt will benefit from further PT, continue with POC.    If plan is discharge home, recommend the following: Assistance with cooking/housework;Assist for transportation;A lot of help with walking and/or transfers;A little help with bathing/dressing/bathroom   Can travel by private vehicle     No  Equipment Recommendations  None recommended by PT    Recommendations for Other Services       Precautions / Restrictions Precautions Precautions: Fall Restrictions Weight Bearing Restrictions: No     Mobility  Bed Mobility               General bed mobility comments: NT, pt upright in recliner upon arrival and at conclusion of session.    Transfers Overall transfer level: Needs assistance Equipment used: Rolling walker (2 wheels) Transfers: Sit to/from Stand Sit to Stand: Mod assist           General transfer comment: Pt showed good effort but could not rise from standard height recliner even with multiple attempts and cuing for set up and sequencing - definite need for assist.  However from highly elevated BSC (and  grab bar) he was able to rise with only CGA    Ambulation/Gait Ambulation/Gait assistance: Min assist, Contact guard assist Gait Distance (Feet): 45 Feet Assistive device: Rolling walker (2 wheels)         General Gait Details: Slow/labored gait, endorses fatigue relatively quickly and did not wish to do a more prolonged bout of ambulation.  Reliant on walker, forward flexed, no LOBs.  SpO2 remains in the mid/high 90s.   Stairs             Wheelchair Mobility     Tilt Bed    Modified Rankin (Stroke Patients Only)       Balance Overall balance assessment: Needs assistance Sitting-balance support: Feet supported, Bilateral upper extremity supported Sitting balance-Leahy Scale: Good       Standing balance-Leahy Scale: Fair Standing balance comment: heavily reliant on RW for stability.  Was able to maintain static standing while PT assisted with clean up post BM                            Cognition                                                Exercises      General Comments General comments (skin integrity, edema, etc.): On arrival pt in recliner, personal diaper pulled down below knees and he reports needing  to have a BM.  PT offered to help him to the bathroom, needed repeated cuing to get him to agree to use a toilet rather than his repeatedly requested "I'd just rather go here in the chair."      Pertinent Vitals/Pain Pain Assessment Pain Location: chronic multi-joint OA pain    Home Living                          Prior Function            PT Goals (current goals can now be found in the care plan section) Progress towards PT goals: Progressing toward goals (slow progress)    Frequency    Min 1X/week      PT Plan      Co-evaluation              AM-PAC PT "6 Clicks" Mobility   Outcome Measure  Help needed turning from your back to your side while in a flat bed without using bedrails?: A  Little Help needed moving from lying on your back to sitting on the side of a flat bed without using bedrails?: A Lot Help needed moving to and from a bed to a chair (including a wheelchair)?: A Little Help needed standing up from a chair using your arms (e.g., wheelchair or bedside chair)?: A Lot Help needed to walk in hospital room?: A Little Help needed climbing 3-5 steps with a railing? : A Lot 6 Click Score: 15    End of Session Equipment Utilized During Treatment: Gait belt Activity Tolerance: Patient tolerated treatment well;Patient limited by fatigue Patient left: in chair;with chair alarm set;with call bell/phone within reach Nurse Communication: Mobility status (and pt had BM) PT Visit Diagnosis: Muscle weakness (generalized) (M62.81);Difficulty in walking, not elsewhere classified (R26.2);Unsteadiness on feet (R26.81);Other abnormalities of gait and mobility (R26.89) Pain - Right/Left: Left Pain - part of body: Knee     Time: 6962-9528 PT Time Calculation (min) (ACUTE ONLY): 24 min  Charges:    $Gait Training: 8-22 mins $Therapeutic Activity: 8-22 mins PT General Charges $$ ACUTE PT VISIT: 1 Visit                     Joshua Berry, DPT 10/29/2023, 1:10 PM

## 2023-10-29 NOTE — Plan of Care (Signed)
  Problem: Education: Goal: Ability to describe self-care measures that may prevent or decrease complications (Diabetes Survival Skills Education) will improve Outcome: Progressing   Problem: Coping: Goal: Ability to adjust to condition or change in health will improve Outcome: Progressing   Problem: Fluid Volume: Goal: Ability to maintain a balanced intake and output will improve Outcome: Progressing   Problem: Health Behavior/Discharge Planning: Goal: Ability to identify and utilize available resources and services will improve Outcome: Progressing   Problem: Metabolic: Goal: Ability to maintain appropriate glucose levels will improve Outcome: Progressing   Problem: Skin Integrity: Goal: Risk for impaired skin integrity will decrease Outcome: Progressing   Problem: Education: Goal: Knowledge of General Education information will improve Description: Including pain rating scale, medication(s)/side effects and non-pharmacologic comfort measures Outcome: Progressing   Problem: Health Behavior/Discharge Planning: Goal: Ability to manage health-related needs will improve Outcome: Progressing   Problem: Clinical Measurements: Goal: Ability to maintain clinical measurements within normal limits will improve Outcome: Progressing   Problem: Activity: Goal: Risk for activity intolerance will decrease Outcome: Progressing   Problem: Nutrition: Goal: Adequate nutrition will be maintained Outcome: Progressing   Problem: Coping: Goal: Level of anxiety will decrease Outcome: Progressing   Problem: Safety: Goal: Ability to remain free from injury will improve Outcome: Progressing   Problem: Skin Integrity: Goal: Risk for impaired skin integrity will decrease Outcome: Progressing

## 2023-10-29 NOTE — TOC Progression Note (Signed)
Transition of Care [facility]) - Progression Note    Patient Details  Name: SEGIO CARTER MRN: [id number] Date of Birth: 1946-04-17  Transition of Care [facility]) CM/SW Contact  Allena Katz, LCSW Phone Number: 10/29/2023, 11:44 AM  Clinical Narrative:   Emailed received from daughter who reports she would like to see if the [place] can make any other exceptions if not she wants the referral sent to [facility] [place]. CSW has faxed referral.     Expected Discharge Plan: Skilled Nursing Facility    Expected Discharge Plan and Services         Expected Discharge Date: 10/24/23                                     Social Determinants of Health (SDOH) Interventions SDOH Screenings   Food Insecurity: Patient Declined (10/17/2023)  Housing: Patient Declined (10/17/2023)  Transportation Needs: Patient Declined (10/17/2023)  Utilities: Patient Declined (10/17/2023)  Depression (PHQ2-9): Low Risk  (04/24/2023)  Financial Resource Strain: Low Risk  (08/06/2023)   [facility]  Social Connections: Unknown (04/29/2022)   Received from [facility]  Tobacco Use: Medium Risk (10/17/2023)    Readmission Risk Interventions    09/15/2023    8:54 AM 08/14/2023    2:18 PM 07/15/2023    8:52 AM  Readmission Risk Prevention Plan  Transportation Screening Complete Complete Complete  PCP or Specialist Appt within 3-5 Days Complete Complete Complete  HRI or Home Care Consult Complete Complete   Social Work Consult for Recovery Care Planning/Counseling Not Complete Complete Complete  Palliative Care Screening Not Applicable Not Applicable Not Applicable  Medication Review Oceanographer) Not Complete Complete Referral to Pharmacy  Med Review Comments Patient confused and disoriented.

## 2023-10-30 DIAGNOSIS — D61818 Other pancytopenia: Secondary | ICD-10-CM | POA: Diagnosis not present

## 2023-10-30 DIAGNOSIS — S065XAA Traumatic subdural hemorrhage with loss of consciousness status unknown, initial encounter: Secondary | ICD-10-CM | POA: Diagnosis not present

## 2023-10-30 DIAGNOSIS — K7682 Hepatic encephalopathy: Secondary | ICD-10-CM | POA: Diagnosis not present

## 2023-10-30 LAB — GLUCOSE, CAPILLARY
Glucose-Capillary: 164 mg/dL — ABNORMAL HIGH (ref 70–99)
Glucose-Capillary: 184 mg/dL — ABNORMAL HIGH (ref 70–99)
Glucose-Capillary: 143 mg/dL — ABNORMAL HIGH (ref 70–99)
Glucose-Capillary: 196 mg/dL — ABNORMAL HIGH (ref 70–99)

## 2023-10-30 NOTE — Progress Notes (Signed)
Progress Note   [name] WJX:[id number] DOB: 1946/02/26 DOA: 10/16/2023     12 DOS: the patient was seen and examined on 10/30/2023   Brief hospital course: Joshua Berry is a 77 yo with h/o NASH cirrhosis, HTN, and DM who presented on 10/31 with AMS.  NH4 89, improving slowly with high doses of lactulose.  CT head showed mixed density subdural hematomas overlying bilateral cerebral hemispheres, stable since 09/15/2023; however there are hyperdense components within both subdural hematoma suggesting interval hemorrhage into these collections.  Repeat CT head after 6 hours did not show any changes.  Patient seen by neurosurgery, no need for surgery intervention.  His confusion resolved. Mental status much improved. [facility]  Assessment and Plan: AMS Hepatic encephalopathy Chronic SDH Patient presenting with encephalopathy as evidenced by transient confusion and word-finding difficulty. This appears to have been associated with hepatic encephalopathy, although he also has acute on chronic SDH which may be contributing. Patient was admitted for ongoing evaluation and management.  He has been treated for encephalopathy with lactulose titrated to 2-4 soft stools/day - currently on 45g q4h with hold parameters, appears to be profoundly sensitive to missed doses of this medication  -Continue Rifaximin He is back to baseline and medically stable for discharge   Cirrhosis MELD/MELD-Na score is 12/15, with a mortality rate of 6.6% Platelets 45, generally stable Continue Lasix, Rifaximin, Lactulose.   Intracranial hemorrhage Repeat CT of the head without contrast in 6 hours showed no change No surgical intervention is needed per neurosurgery team.   Uncontrolled type 2 diabetes with hyperglycemia A1c 5.9, good control Continue Glucophage   GERD without esophagitis Continue Protonix   Hypothyroidism Continue levothyroxine   Hypomagnesemia Takes 800 mg PO  magnesium BID at home Monitor Mag++ level in AM   DNR A gold out of facility DNR form is signed and in his chart       Out of bed to chair. Incentive spirometry. Nursing supportive care. Fall, aspiration precautions. DVT prophylaxis   Code Status: Limited: Do not attempt resuscitation (DNR) -DNR-LIMITED -Do Not Intubate/DNI   Subjective: Patient is seen and examined today morning. He denies any complaints. Brother at bedside. He is awaiting placement.  Physical Exam: Vitals:   10/29/23 2025 10/30/23 0438 10/30/23 0804 10/30/23 1602  BP: 121/62 (!) 115/59 117/62 (!) 143/74  Pulse: 62 (!) 59 62 61  Resp: 18 18 16 16   Temp: 97.9 F (36.6 C) 97.6 F (36.4 C) 98.1 F (36.7 C) 98 F (36.7 C)  TempSrc:      SpO2: 100% 100% 99% 100%  Weight:      Height:        General - Elderly Caucasian male, no apparent distress HEENT - PERRLA, EOMI, atraumatic head, non tender sinuses. Lung - Clear, diffuse rales, no rhonchi, wheezes. Heart - S1, S2 heard, no murmurs, rubs, trace pedal edema. Abdomen - Soft, non tender, bowel sounds good Neuro - Alert, awake and oriented x 3, non focal exam. Skin - Warm and dry.  Data Reviewed:      Latest Ref Rng & Units 10/26/2023    3:45 AM 10/24/2023    5:06 AM 10/23/2023    8:35 AM  CBC  WBC 4.0 - 10.5 K/uL 2.1  2.3  2.3   Hemoglobin 13.0 - 17.0 g/dL 9.1  9.7  9.5   Hematocrit 39.0 - 52.0 % 26.4  27.8  27.1   Platelets 150 - 400 K/uL 43  46  44       Latest Ref Rng & Units 10/26/2023    3:45 AM 10/24/2023    5:06 AM 10/23/2023    8:35 AM  BMP  Glucose 70 - 99 mg/dL 161  096  045   BUN 8 - 23 mg/dL 12  13  13    Creatinine 0.61 - 1.24 mg/dL 4.09  8.11  9.14   Sodium 135 - 145 mmol/L 138  139  134   Potassium 3.5 - 5.1 mmol/L 3.7  3.6  3.6   Chloride 98 - 111 mmol/L 108  110  104   CO2 22 - 32 mmol/L 24  22  24    Calcium 8.9 - 10.3 mg/dL 8.6  8.8  8.4    No results found.   Family Communication: Discussed with patient, he  understand and agree. All questions answereed.    Disposition: Status is: Inpatient Remains inpatient appropriate because: awaiting SNF placement.  Planned Discharge Destination: Skilled nursing facility     Time spent: 31 minutes  Author: Marcelino Duster, MD 10/30/2023 5:22 PM Secure chat 7am to 7pm For on call review [email or web address]

## 2023-10-30 NOTE — TOC Progression Note (Signed)
Transition of Care [facility]) - Progression Note    Patient Details  Name: Joshua Berry MRN: [id number] Date of Birth: 23-May-1946  Transition of Care [facility]) CM/SW Contact  Allena Katz, LCSW Phone Number: 10/30/2023, 11:41 AM  Clinical Narrative:   CSW LVM with facility in [place] regarding referral.     Expected Discharge Plan: Skilled Nursing Facility    Expected Discharge Plan and Services         Expected Discharge Date: 10/24/23                                     Social Determinants of Health (SDOH) Interventions SDOH Screenings   Food Insecurity: Patient Declined (10/17/2023)  Housing: Patient Declined (10/17/2023)  Transportation Needs: Patient Declined (10/17/2023)  Utilities: Patient Declined (10/17/2023)  Depression (PHQ2-9): Low Risk  (04/24/2023)  Financial Resource Strain: Low Risk  (08/06/2023)   Received from [facility]  Social Connections: Unknown (04/29/2022)   Received from [facility]  Tobacco Use: Medium Risk (10/17/2023)    Readmission Risk Interventions    09/15/2023    8:54 AM 08/14/2023    2:18 PM 07/15/2023    8:52 AM  Readmission Risk Prevention Plan  Transportation Screening Complete Complete Complete  PCP or Specialist Appt within 3-5 Days Complete Complete Complete  HRI or Home Care Consult Complete Complete   Social Work Consult for Recovery Care Planning/Counseling Not Complete Complete Complete  Palliative Care Screening Not Applicable Not Applicable Not Applicable  Medication Review (RN Care Manager) Not Complete Complete Referral to Pharmacy  Med Review Comments Patient confused and disoriented.

## 2023-10-30 NOTE — Progress Notes (Signed)
Mobility Specialist - Progress Note     10/30/23 1600  Mobility  Activity Ambulated with assistance in hallway  Level of Assistance Moderate assist, patient does 50-74% (with chair follow)  Assistive Device Front wheel walker  Distance Ambulated (ft) 20 ft  Range of Motion/Exercises Active  Activity Response Tolerated well  Mobility Referral Yes  $Mobility charge 1 Mobility  Mobility Specialist Start Time (ACUTE ONLY) 1550  Mobility Specialist Stop Time (ACUTE ONLY) 1601  Mobility Specialist Time Calculation (min) (ACUTE ONLY) 11 min   Pt resting in recliner on RA upon entry. Pt STS and ambulates to hallway outside room ModA RW with chair follow. Pt endorses pain in head/neck (RN Notified). Pt given vocal cuing to keep head raised and widen gait. Pt returned to recliner and left with needs in reach. Pt chair alarm activated.   Johnathan Hausen Mobility Specialist 10/30/23, 4:35 PM

## 2023-10-31 DIAGNOSIS — D61818 Other pancytopenia: Secondary | ICD-10-CM | POA: Diagnosis not present

## 2023-10-31 DIAGNOSIS — S065XAA Traumatic subdural hemorrhage with loss of consciousness status unknown, initial encounter: Secondary | ICD-10-CM | POA: Diagnosis not present

## 2023-10-31 DIAGNOSIS — K7682 Hepatic encephalopathy: Secondary | ICD-10-CM | POA: Diagnosis not present

## 2023-10-31 LAB — GLUCOSE, CAPILLARY
Glucose-Capillary: 164 mg/dL — ABNORMAL HIGH (ref 70–99)
Glucose-Capillary: 161 mg/dL — ABNORMAL HIGH (ref 70–99)
Glucose-Capillary: 146 mg/dL — ABNORMAL HIGH (ref 70–99)
Glucose-Capillary: 199 mg/dL — ABNORMAL HIGH (ref 70–99)
Glucose-Capillary: 206 mg/dL — ABNORMAL HIGH (ref 70–99)

## 2023-10-31 LAB — BASIC METABOLIC PANEL
Anion gap: 8 (ref 5–15)
BUN: 15 mg/dL (ref 8–23)
CO2: 20 mmol/L — ABNORMAL LOW (ref 22–32)
Calcium: 8.8 mg/dL — ABNORMAL LOW (ref 8.9–10.3)
Chloride: 110 mmol/L (ref 98–111)
Creatinine, Ser: 0.63 mg/dL (ref 0.61–1.24)
GFR, Estimated: >60 mL/min (ref >60)
Glucose, Bld: 196 mg/dL — ABNORMAL HIGH (ref 70–99)
Potassium: 3.4 mmol/L — ABNORMAL LOW (ref 3.5–5.1)
Sodium: 138 mmol/L (ref 135–145)

## 2023-10-31 LAB — AMMONIA: Ammonia: 70 umol/L — ABNORMAL HIGH (ref 9–35)

## 2023-10-31 LAB — MAGNESIUM: Magnesium: 1.8 mg/dL (ref 1.7–2.4)

## 2023-10-31 NOTE — Care Management Important Message (Signed)
Important Message  Patient Details  Name: Joshua Berry MRN: [id number] Date of Birth: 26-Jun-1946   Important Message Given:  Yes - Medicare and T[employer]are IM     [name] A Reygan Heagle 10/31/2023, 2:53 PM

## 2023-10-31 NOTE — Progress Notes (Signed)
Mobility Specialist - Progress Note   10/31/23 1228  Mobility  Activity Ambulated with assistance in hallway  Level of Assistance Contact guard assist, steadying assist  Assistive Device Front wheel walker  Distance Ambulated (ft) 140 ft  Activity Response Tolerated well  $Mobility charge 1 Mobility  Mobility Specialist Start Time (ACUTE ONLY) 1147  Mobility Specialist Stop Time (ACUTE ONLY) 1205  Mobility Specialist Time Calculation (min) (ACUTE ONLY) 18 min   Pt supine upon entry, utilizing RA. Pt agreeable to OOB amb in the hallway with chair follow this date. Pt completed bed mob MinA to bring BLE EOB, HHA to bring trunk from spine to sitting-- able to self support trunk once seated EOB. Pt STS to RW max effort from Pt with ModA, NBOS. Pt amb 140 ft in the hallway stopping twice for a seated rest break due to fatigue and weakness. During amb pt required cueing for head placement and to widen feet to shoulder-width apart. Pt wheeled into room, left seated in the recliner with alarm set and needs within reach.  Zetta Bills Mobility Specialist 10/31/23 12:41 PM

## 2023-10-31 NOTE — Progress Notes (Signed)
Progress Note   [name] JXB:[id number] DOB: [birth year] DOA: 10/16/2023     13 DOS: the patient was seen and examined on 10/31/2023   Brief hospital course: Joshua Berry is a 77 yo with h/o NASH cirrhosis, HTN, and DM who presented on 10/31 with AMS.  NH4 89, improving slowly with high doses of lactulose.  CT head showed mixed density subdural hematomas overlying bilateral cerebral hemispheres, stable since 09/15/2023; however there are hyperdense components within both subdural hematoma suggesting interval hemorrhage into these collections.  Repeat CT head after 6 hours did not show any changes.  Patient seen by neurosurgery, no need for surgery intervention.  His confusion resolved. Mental status much improved. [facility]  Assessment and Plan: AMS Hepatic encephalopathy Chronic SDH Patient presenting with encephalopathy as evidenced by transient confusion and word-finding difficulty. This appears to have been associated with hepatic encephalopathy, although he also has acute on chronic SDH which may be contributing. Patient was admitted for ongoing evaluation and management.  He has been treated for encephalopathy with lactulose titrated to 2-4 soft stools/day - currently on 45g q4h with hold parameters, appears to be profoundly sensitive to missed doses of this medication  -Continue Rifaximin He is back to baseline and medically stable for discharge. Good bowel movements with lactulose. Repeat ammonia level ordered.   Cirrhosis MELD/MELD-Na score is 12/15, with a mortality rate of 6.6% Platelets 45, generally stable Continue Lasix, Rifaximin, Lactulose.   Intracranial hemorrhage Repeat CT of the head without contrast in 6 hours showed no change No surgical intervention is needed per neurosurgery team.   Uncontrolled type 2 diabetes with hyperglycemia A1c 5.9, good control Continue Glucophage   GERD without esophagitis Continue Protonix    Hypothyroidism Continue levothyroxine   Hypomagnesemia Takes 800 mg PO magnesium BID at home Repeat mag level ordered.  Out of bed to chair. Incentive spirometry. Nursing supportive care. Fall, aspiration precautions. DVT prophylaxis   Code Status: Limited: Do not attempt resuscitation (DNR) -DNR-LIMITED -Do Not Intubate/DNI   Subjective: Patient is seen and examined today morning. Asks for mag check. No family at bedside. RN notified daughter wants ammonia, mag checked. Orders placed.  Physical Exam: Vitals:   10/30/23 2011 10/31/23 0505 10/31/23 0821 10/31/23 1120  BP: (!) 108/58 103/60 118/61 (!) 107/58  Pulse: 62 (!) 57 (!) 56 (!) 58  Resp: 16 18 18    Temp: 97.6 F (36.4 C) 97.6 F (36.4 C) 97.8 F (36.6 C)   TempSrc:   Oral   SpO2: 98% 98% 100%   Weight:      Height:        General - Elderly Caucasian male, no apparent distress HEENT - PERRLA, EOMI, atraumatic head, non tender sinuses. Lung - Clear, diffuse rales, no rhonchi, wheezes. Heart - S1, S2 heard, no murmurs, rubs, trace pedal edema. Abdomen - Soft, non tender, bowel sounds good Neuro - Alert, awake and oriented x 3, non focal exam. Skin - Warm and dry.  Data Reviewed:      Latest Ref Rng & Units 10/26/2023    3:45 AM 10/24/2023    5:06 AM 10/23/2023    8:35 AM  CBC  WBC 4.0 - 10.5 K/uL 2.1  2.3  2.3   Hemoglobin 13.0 - 17.0 g/dL 9.1  9.7  9.5   Hematocrit 39.0 - 52.0 % 26.4  27.8  27.1   Platelets 150 - 400 K/uL 43  46  44  Latest Ref Rng & Units 10/26/2023    3:45 AM 10/24/2023    5:06 AM 10/23/2023    8:35 AM  BMP  Glucose 70 - 99 mg/dL 621  308  657   BUN 8 - 23 mg/dL 12  13  13    Creatinine 0.61 - 1.24 mg/dL 8.46  9.62  9.52   Sodium 135 - 145 mmol/L 138  139  134   Potassium 3.5 - 5.1 mmol/L 3.7  3.6  3.6   Chloride 98 - 111 mmol/L 108  110  104   CO2 22 - 32 mmol/L 24  22  24    Calcium 8.9 - 10.3 mg/dL 8.6  8.8  8.4    No results found.   Family Communication: Discussed  with patient, he understand and agree. All questions answereed.  Disposition: Status is: Inpatient Remains inpatient appropriate because: awaiting SNF placement.  Planned Discharge Destination: Skilled nursing facility     Time spent: 33 minutes  Author: Marcelino Duster, MD 10/31/2023 3:22 PM Secure chat 7am to 7pm For on call review [email or web address].

## 2023-10-31 NOTE — Progress Notes (Signed)
PT Cancellation Note  Patient Details Name: [name] MRN: [id number] DOB: Nov 27, 1946   Cancelled Treatment:    Reason Eval/Treat Not Completed: Other (comment).  Chart reviewed and attempted to see pt.  Pt slumped over in recliner and asleep.  Pt awakened easily, however ultimately declined therapy as he already ambulated with mobility earlier.  Therapist assisted with leg rest in order to prevent pt from slumping over and falling out of chair.     Nolon Bussing, PT, DPT Physical Therapist - [facility]  10/31/23, 3:44 PM

## 2023-10-31 NOTE — TOC Progression Note (Signed)
Transition of Care [facility]) - Progression Note    Patient Details  Name: NISSIM LIAS MRN: [id number] Date of Birth: 1946-05-03  Transition of [facility]) CM/SW Contact  Allena Katz, LCSW Phone Number: 10/31/2023, 1:22 PM  Clinical Narrative:   Third message left for [facility].     Expected Discharge Plan: Skilled Nursing Facility    Expected Discharge Plan and Services         Expected Discharge Date: 10/24/23                                     Social Determinants of Health (SDOH) Interventions SDOH Screenings   Food Insecurity: Patient Declined (10/17/2023)  Housing: Patient Declined (10/17/2023)  Transportation Needs: Patient Declined (10/17/2023)  Utilities: Patient Declined (10/17/2023)  Depression (PHQ2-9): Low Risk  (04/24/2023)  Financial Resource Strain: Low Risk  (08/06/2023)   Received from [facility]  Social Connections: Unknown (04/29/2022)   Received from [facility]  Tobacco Use: Medium Risk (10/17/2023)    Readmission Risk Interventions    09/15/2023    8:54 AM 08/14/2023    2:18 PM 07/15/2023    8:52 AM  Readmission Risk Prevention Plan  Transportation Screening Complete Complete Complete  PCP or Specialist Appt within 3-5 Days Complete Complete Complete  HRI or Home Care Consult Complete Complete   Social Work Consult for Recovery Care Planning/Counseling Not Complete Complete Complete  Palliative Care Screening Not Applicable Not Applicable Not Applicable  Medication Review Oceanographer) Not Complete Complete Referral to Pharmacy  Med Review Comments Patient confused and disoriented.

## 2023-11-01 DIAGNOSIS — D61818 Other pancytopenia: Secondary | ICD-10-CM | POA: Diagnosis not present

## 2023-11-01 DIAGNOSIS — S065XAA Traumatic subdural hemorrhage with loss of consciousness status unknown, initial encounter: Secondary | ICD-10-CM | POA: Diagnosis not present

## 2023-11-01 DIAGNOSIS — K7682 Hepatic encephalopathy: Secondary | ICD-10-CM | POA: Diagnosis not present

## 2023-11-01 LAB — GLUCOSE, CAPILLARY
Glucose-Capillary: 204 mg/dL — ABNORMAL HIGH (ref 70–99)
Glucose-Capillary: 152 mg/dL — ABNORMAL HIGH (ref 70–99)
Glucose-Capillary: 137 mg/dL — ABNORMAL HIGH (ref 70–99)
Glucose-Capillary: 158 mg/dL — ABNORMAL HIGH (ref 70–99)

## 2023-11-01 NOTE — Plan of Care (Signed)
  Problem: Education: Goal: Ability to describe self-care measures that may prevent or decrease complications (Diabetes Survival Skills Education) will improve Outcome: Progressing   Problem: Coping: Goal: Ability to adjust to condition or change in health will improve Outcome: Progressing   Problem: Fluid Volume: Goal: Ability to maintain a balanced intake and output will improve Outcome: Progressing   Problem: Skin Integrity: Goal: Risk for impaired skin integrity will decrease Outcome: Progressing   Problem: Tissue Perfusion: Goal: Adequacy of tissue perfusion will improve Outcome: Progressing   Problem: Education: Goal: Knowledge of General Education information will improve Description: Including pain rating scale, medication(s)/side effects and non-pharmacologic comfort measures Outcome: Progressing   Problem: Clinical Measurements: Goal: Ability to maintain clinical measurements within normal limits will improve Outcome: Progressing   Problem: Nutrition: Goal: Adequate nutrition will be maintained Outcome: Progressing   Problem: Coping: Goal: Level of anxiety will decrease Outcome: Progressing   Problem: Pain Management: Goal: General experience of comfort will improve Outcome: Progressing

## 2023-11-01 NOTE — Plan of Care (Signed)
  Problem: Education: Goal: Ability to describe self-care measures that may prevent or decrease complications (Diabetes Survival Skills Education) will improve Outcome: Progressing Goal: Individualized Educational Video(s) Outcome: Progressing   Problem: Coping: Goal: Ability to adjust to condition or change in health will improve Outcome: Progressing   Problem: Fluid Volume: Goal: Ability to maintain a balanced intake and output will improve Outcome: Progressing   Problem: Skin Integrity: Goal: Risk for impaired skin integrity will decrease Outcome: Progressing   Problem: Nutritional: Goal: Maintenance of adequate nutrition will improve Outcome: Progressing Goal: Progress toward achieving an optimal weight will improve Outcome: Progressing   Problem: Clinical Measurements: Goal: Ability to maintain clinical measurements within normal limits will improve Outcome: Progressing Goal: Will remain free from infection Outcome: Progressing Goal: Diagnostic test results will improve Outcome: Progressing Goal: Respiratory complications will improve Outcome: Progressing Goal: Cardiovascular complication will be avoided Outcome: Progressing

## 2023-11-01 NOTE — Progress Notes (Signed)
Progress Note   [name] UVO:[id number] DOB: 1945/12/27 DOA: 10/16/2023     14 DOS: the patient was seen and examined on 11/01/2023   Brief hospital course: Joshua Berry is a 77 yo with h/o NASH cirrhosis, HTN, and DM who presented on 10/31 with AMS.  NH4 89, improving slowly with high doses of lactulose.  CT head showed mixed density subdural hematomas overlying bilateral cerebral hemispheres, stable since 09/15/2023; however there are hyperdense components within both subdural hematoma suggesting interval hemorrhage into these collections.  Repeat CT head after 6 hours did not show any changes.  Patient seen by neurosurgery, no need for surgery intervention.  His confusion resolved. Mental status much improved. [facility]  Assessment and Plan: Hepatic encephalopathy Chronic SDH Patient presenting with encephalopathy as evidenced by transient confusion and word-finding difficulty.  He has been treated for encephalopathy with lactulose titrated to 2-4 soft stools/day - currently on 45g q4h with hold parameters, appears to be profoundly sensitive to missed doses of this medication. His mental status much improved. Ammonia 70. Continue Rifaximin. He is back to baseline and medically stable for discharge. Good bowel movements with lactulose.   Cirrhosis MELD/MELD-Na score is 12/15, with a mortality rate of 6.6% Platelets 45, generally stable Continue Lasix, Rifaximin, Lactulose.   Intracranial hemorrhage CT of the head without contrast showed no change. No surgical intervention is needed per neurosurgery team.   Uncontrolled type 2 diabetes with hyperglycemia A1c 5.9, good control Continue Glucophage   GERD without esophagitis Continue Protonix   Hypothyroidism Continue levothyroxine   Hypomagnesemia Takes 800 mg PO magnesium BID at home Repeat mag level 1.8  Out of bed to chair. Incentive spirometry. Nursing supportive care. Fall, aspiration  precautions. DVT prophylaxis   Code Status: Limited: Do not attempt resuscitation (DNR) -DNR-LIMITED -Do Not Intubate/DNI   Subjective: Patient is seen and examined today morning. No overnight issues, no family at bedside. States he is eating, bowel movements good, does not want constipation regimen.  Physical Exam: Vitals:   10/31/23 1557 10/31/23 2025 11/01/23 0516 11/01/23 0753  BP: 103/60 (!) 100/49 (!) 109/54 (!) 113/58  Pulse: 61 63 (!) 59 79  Resp: 19 18 18 16   Temp: 97.8 F (36.6 C) 98.3 F (36.8 C) 97.6 F (36.4 C) 98.4 F (36.9 C)  TempSrc: Oral Oral Oral   SpO2: 100% 100% 99% 99%  Weight:      Height:        General - Elderly Caucasian male, no apparent distress HEENT - PERRLA, EOMI, atraumatic head, non tender sinuses. Lung - Clear, diffuse rales, no rhonchi, wheezes. Heart - S1, S2 heard, no murmurs, rubs, trace pedal edema. Abdomen - Soft, non tender, bowel sounds good Neuro - Alert, awake and oriented x 3, non focal exam. Skin - Warm and dry.  Data Reviewed:      Latest Ref Rng & Units 10/26/2023    3:45 AM 10/24/2023    5:06 AM 10/23/2023    8:35 AM  CBC  WBC 4.0 - 10.5 K/uL 2.1  2.3  2.3   Hemoglobin 13.0 - 17.0 g/dL 9.1  9.7  9.5   Hematocrit 39.0 - 52.0 % 26.4  27.8  27.1   Platelets 150 - 400 K/uL 43  46  44       Latest Ref Rng & Units 10/31/2023    3:19 PM 10/26/2023    3:45 AM 10/24/2023    5:06 AM  BMP  Glucose 70 -  99 mg/dL 161  096  045   BUN 8 - 23 mg/dL 15  12  13    Creatinine 0.61 - 1.24 mg/dL 4.09  8.11  9.14   Sodium 135 - 145 mmol/L 138  138  139   Potassium 3.5 - 5.1 mmol/L 3.4  3.7  3.6   Chloride 98 - 111 mmol/L 110  108  110   CO2 22 - 32 mmol/L 20  24  22    Calcium 8.9 - 10.3 mg/dL 8.8  8.6  8.8    No results found.   Family Communication: Discussed with patient, he understand and agree. All questions answereed.  Disposition: Status is: Inpatient Remains inpatient appropriate because: awaiting SNF placement.   Planned Discharge Destination: Skilled nursing facility     Time spent: 31 minutes  Author: Marcelino Duster, MD 11/01/2023 6:41 PM Secure chat 7am to 7pm For on call review [email or web address].

## 2023-11-02 DIAGNOSIS — S065XAA Traumatic subdural hemorrhage with loss of consciousness status unknown, initial encounter: Secondary | ICD-10-CM | POA: Diagnosis not present

## 2023-11-02 DIAGNOSIS — D61818 Other pancytopenia: Secondary | ICD-10-CM | POA: Diagnosis not present

## 2023-11-02 DIAGNOSIS — K7682 Hepatic encephalopathy: Secondary | ICD-10-CM | POA: Diagnosis not present

## 2023-11-02 LAB — GLUCOSE, CAPILLARY
Glucose-Capillary: 114 mg/dL — ABNORMAL HIGH (ref 70–99)
Glucose-Capillary: 130 mg/dL — ABNORMAL HIGH (ref 70–99)
Glucose-Capillary: 232 mg/dL — ABNORMAL HIGH (ref 70–99)
Glucose-Capillary: 161 mg/dL — ABNORMAL HIGH (ref 70–99)

## 2023-11-02 MED ORDER — ACETAMINOPHEN 500 MG PO TABS
500.0000 mg | ORAL_TABLET | Freq: Once | ORAL | Status: AC
  Administered 2023-11-02: 500 mg via ORAL
  Filled 2023-11-02: qty 1

## 2023-11-02 NOTE — Progress Notes (Signed)
Progress Note   [name] ZOX:[id number] DOB: 11/19/1946 DOA: 10/16/2023     15 DOS: the patient was seen and examined on 11/02/2023   Brief hospital course: DAIMON SARTORIUS is a 77 yo with h/o NASH cirrhosis, HTN, and DM who presented on 10/31 with AMS.  NH4 89, improving slowly with high doses of lactulose.  CT head showed mixed density subdural hematomas overlying bilateral cerebral hemispheres, stable since 09/15/2023; however there are hyperdense components within both subdural hematoma suggesting interval hemorrhage into these collections.  Repeat CT head after 6 hours did not show any changes.  Patient seen by neurosurgery, no need for surgery intervention.  His confusion resolved. Mental status much improved. [facility].  Assessment and Plan: AMS Hepatic encephalopathy Chronic SDH Patient presenting with encephalopathy as evidenced by transient confusion and word-finding difficulty. This appears to have been associated with hepatic encephalopathy, although he also has acute on chronic SDH which may be contributing. Patient was admitted for ongoing evaluation and management.  He has been treated for encephalopathy with lactulose titrated to 2-4 soft stools/day - currently on 45g q4h with hold parameters. Advised compliance. Continue Rifaximin He is back to baseline and medically stable for discharge. Good bowel movements with lactulose. Repeat ammonia level ordered.   Cirrhosis MELD/MELD-Na score is 12/15, with a mortality rate of 6.6% Platelets 45, generally stable Continue Lasix, Rifaximin, Lactulose.   Intracranial hemorrhage Repeat CT of the head without contrast in 6 hours showed no change No surgical intervention is needed per neurosurgery team.   Uncontrolled type 2 diabetes with hyperglycemia A1c 5.9, good control Continue Glucophage   GERD without esophagitis Continue Protonix   Hypothyroidism Continue levothyroxine   Hypomagnesemia Takes  800 mg PO magnesium BID at home Repeat mag level ordered.  Out of bed to chair. Incentive spirometry. Nursing supportive care. Fall, aspiration precautions. DVT prophylaxis   Code Status: Limited: Do not attempt resuscitation (DNR) -DNR-LIMITED -Do Not Intubate/DNI   Subjective: Patient is seen and examined today morning. Family at bedside. No overnight issues.  Physical Exam: Vitals:   10/31/23 2025 11/01/23 0516 11/01/23 0753 11/01/23 2118  BP: (!) 100/49 (!) 109/54 (!) 113/58 (!) 106/56  Pulse: 63 (!) 59 79 (!) 55  Resp: 18 18 16 16   Temp: 98.3 F (36.8 C) 97.6 F (36.4 C) 98.4 F (36.9 C) 98 F (36.7 C)  TempSrc: Oral Oral  Oral  SpO2: 100% 99% 99% 100%  Weight:      Height:        General - Elderly Caucasian male, no apparent distress HEENT - PERRLA, EOMI, atraumatic head, non tender sinuses. Lung - Clear, diffuse rales, no rhonchi, wheezes. Heart - S1, S2 heard, no murmurs, rubs, trace pedal edema. Abdomen - Soft, non tender, bowel sounds good Neuro - Alert, awake and oriented x 3, non focal exam. Skin - Warm and dry.  Data Reviewed:      Latest Ref Rng & Units 10/26/2023    3:45 AM 10/24/2023    5:06 AM 10/23/2023    8:35 AM  CBC  WBC 4.0 - 10.5 K/uL 2.1  2.3  2.3   Hemoglobin 13.0 - 17.0 g/dL 9.1  9.7  9.5   Hematocrit 39.0 - 52.0 % 26.4  27.8  27.1   Platelets 150 - 400 K/uL 43  46  44       Latest Ref Rng & Units 10/31/2023    3:19 PM 10/26/2023    3:45 AM  10/24/2023    5:06 AM  BMP  Glucose 70 - 99 mg/dL 161  096  045   BUN 8 - 23 mg/dL 15  12  13    Creatinine 0.61 - 1.24 mg/dL 4.09  8.11  9.14   Sodium 135 - 145 mmol/L 138  138  139   Potassium 3.5 - 5.1 mmol/L 3.4  3.7  3.6   Chloride 98 - 111 mmol/L 110  108  110   CO2 22 - 32 mmol/L 20  24  22    Calcium 8.9 - 10.3 mg/dL 8.8  8.6  8.8    No results found.   Family Communication: Discussed with patient, he understand and agree. All questions answereed.  Disposition: Status is:  Inpatient Remains inpatient appropriate because: awaiting SNF placement.  Planned Discharge Destination: Skilled nursing facility     Time spent: 27 minutes  Author: Marcelino Duster, MD 11/02/2023 12:53 PM Secure chat 7am to 7pm For on call review [email or web address].

## 2023-11-03 DIAGNOSIS — D61818 Other pancytopenia: Secondary | ICD-10-CM | POA: Diagnosis not present

## 2023-11-03 DIAGNOSIS — K7682 Hepatic encephalopathy: Secondary | ICD-10-CM | POA: Diagnosis not present

## 2023-11-03 DIAGNOSIS — S065XAA Traumatic subdural hemorrhage with loss of consciousness status unknown, initial encounter: Secondary | ICD-10-CM | POA: Diagnosis not present

## 2023-11-03 LAB — GLUCOSE, CAPILLARY
Glucose-Capillary: 173 mg/dL — ABNORMAL HIGH (ref 70–99)
Glucose-Capillary: 190 mg/dL — ABNORMAL HIGH (ref 70–99)
Glucose-Capillary: 225 mg/dL — ABNORMAL HIGH (ref 70–99)
Glucose-Capillary: 246 mg/dL — ABNORMAL HIGH (ref 70–99)

## 2023-11-03 MED ORDER — ACETAMINOPHEN 500 MG PO TABS
500.0000 mg | ORAL_TABLET | Freq: Three times a day (TID) | ORAL | Status: DC | PRN
  Administered 2023-11-03 – 2023-11-22 (×4): 500 mg via ORAL
  Filled 2023-11-03 (×4): qty 1

## 2023-11-03 NOTE — Progress Notes (Signed)
Progress Note   [name] UEA:[id number] DOB: 1946/03/04 DOA: 10/16/2023     16 DOS: the patient was seen and examined on 11/03/2023   Brief hospital course: Joshua Berry is a 77 yo with h/o NASH cirrhosis, HTN, and DM who presented on 10/31 with AMS.  NH4 89, improving slowly with high doses of lactulose.  CT head showed mixed density subdural hematomas overlying bilateral cerebral hemispheres, stable since 09/15/2023; however there are hyperdense components within both subdural hematoma suggesting interval hemorrhage into these collections.  Repeat CT head after 6 hours did not show any changes.  Patient seen by neurosurgery, no need for surgery intervention.  His confusion resolved. Mental status much improved. [facility]  Assessment and Plan: AMS Hepatic encephalopathy Chronic SDH Patient presenting with encephalopathy as evidenced by transient confusion and word-finding difficulty. This appears to have been associated with hepatic encephalopathy, although he also has acute on chronic SDH which may be contributing. Patient was admitted for ongoing evaluation and management.  He has been treated for encephalopathy with lactulose titrated to 2-4 soft stools/day - currently on 45g q4h with hold parameters. Advised compliance. Continue Rifaximin He is back to baseline and medically stable for discharge. Good bowel movements with lactulose. Ammonia 70 11/15.   Cirrhosis MELD/MELD-Na score is 12/15, with a mortality rate of 6.6% Platelets 45, generally stable Continue Lasix, Rifaximin, Lactulose.   Intracranial hemorrhage Repeat CT of the head without contrast in 6 hours showed no change No surgical intervention is needed per neurosurgery team.   Uncontrolled type 2 diabetes with hyperglycemia A1c 5.9, good control Continue Glucophage   GERD without esophagitis Continue Protonix   Hypothyroidism Continue levothyroxine   Hypomagnesemia Takes 800 mg PO  magnesium BID at home Repeat mag level ordered.  Out of bed to chair. Incentive spirometry. Nursing supportive care. Fall, aspiration precautions. DVT prophylaxis   Code Status: Limited: Do not attempt resuscitation (DNR) -DNR-LIMITED -Do Not Intubate/DNI   Subjective: Patient is seen and examined today morning. He has back pain. No overnight issues.  Physical Exam: Vitals:   11/02/23 1500 11/02/23 2000 11/03/23 0439 11/03/23 0826  BP: (!) 119/56 117/60 (!) 104/59 111/60  Pulse: 61 63 63 65  Resp: 18 16 18 19   Temp: 98.8 F (37.1 C) 98.2 F (36.8 C) 98.1 F (36.7 C) 98 F (36.7 C)  TempSrc: Oral Oral    SpO2: 100% 100% 100% 97%  Weight:      Height:        General - Elderly Caucasian male, no apparent distress HEENT - PERRLA, EOMI, atraumatic head, non tender sinuses. Lung - Clear, diffuse rales, no rhonchi, wheezes. Heart - S1, S2 heard, no murmurs, rubs, trace pedal edema. Abdomen - Soft, non tender, bowel sounds good Neuro - Alert, awake and oriented x 3, non focal exam. Skin - Warm and dry.  Data Reviewed:      Latest Ref Rng & Units 10/26/2023    3:45 AM 10/24/2023    5:06 AM 10/23/2023    8:35 AM  CBC  WBC 4.0 - 10.5 K/uL 2.1  2.3  2.3   Hemoglobin 13.0 - 17.0 g/dL 9.1  9.7  9.5   Hematocrit 39.0 - 52.0 % 26.4  27.8  27.1   Platelets 150 - 400 K/uL 43  46  44       Latest Ref Rng & Units 10/31/2023    3:19 PM 10/26/2023    3:45 AM 10/24/2023  5:06 AM  BMP  Glucose 70 - 99 mg/dL 295  284  132   BUN 8 - 23 mg/dL 15  12  13    Creatinine 0.61 - 1.24 mg/dL 4.40  1.02  7.25   Sodium 135 - 145 mmol/L 138  138  139   Potassium 3.5 - 5.1 mmol/L 3.4  3.7  3.6   Chloride 98 - 111 mmol/L 110  108  110   CO2 22 - 32 mmol/L 20  24  22    Calcium 8.9 - 10.3 mg/dL 8.8  8.6  8.8    No results found.   Family Communication: Discussed with patient, he understand and agree. All questions answereed.  Disposition: Status is: Inpatient Remains inpatient  appropriate because: awaiting SNF placement.  Planned Discharge Destination: Skilled nursing facility     Time spent: 26 minutes  Author: Marcelino Duster, MD 11/03/2023 1:40 PM Secure chat 7am to 7pm For on call review [email or web address].

## 2023-11-03 NOTE — Progress Notes (Signed)
Occupational Therapy Re-Evaluation Patient Details Name: MCDONALD SHANKEL MRN: [id number] DOB: February 08, 1946 Today's Date: 11/03/2023   History of present illness Mr. Dawuan Bendolph is a 77 year old male with history of liver cirrhosis, hypertension, history of GI bleed, thrombocytopenia, insulin-dependent diabetes mellitus, GERD, who presents emergency department for chief concerns of altered mental status. MD assessment includes ICH, hepatic encephalopathy, hypothyroidism, hypomagnesemia, and physical deconditioning.   OT comments  Mr Clemence was seen for OT treatment on this date. Upon arrival to room pt in bed, agreeable to tx. Pt requires MIN A exit bed and MIN A + RW sit<>stand x5 trials, initial MOD A to maintain balance improves to CGA over 3 trials. Pt reports 6/10 pain in bottom, small open sore noted, RN notified and in to place dressing. CGA + RW for funcitonal mobility ~20 ft. Goals remain appropriate, will continue to follow POC. Discharge recommendation remains appropriate.       If plan is discharge home, recommend the following:  A lot of help with bathing/dressing/bathroom;Help with stairs or ramp for entrance;A lot of help with walking and/or transfers   Equipment Recommendations  Other (comment) (defer)    Recommendations for Other Services      Precautions / Restrictions Precautions Precautions: Fall Restrictions Weight Bearing Restrictions: No       Mobility Bed Mobility Overal bed mobility: Needs Assistance Bed Mobility: Supine to Sit     Supine to sit: Min assist          Transfers Overall transfer level: Needs assistance Equipment used: Rolling walker (2 wheels) Transfers: Sit to/from Stand Sit to Stand: Min assist                 Balance Overall balance assessment: Needs assistance Sitting-balance support: Feet supported, Bilateral upper extremity supported Sitting balance-Leahy Scale: Good     Standing balance support: Bilateral upper extremity  supported, Reliant on assistive device for balance Standing balance-Leahy Scale: Fair                             ADL either performed or assessed with clinical judgement   ADL Overall ADL's : Needs assistance/impaired                                       General ADL Comments: MAX A don/doff B socks and shoes in sitting. MAX A pericare standing. CGA + RW for simulated toilet t/f    Extremity/Trunk Assessment Upper Extremity Assessment Upper Extremity Assessment: Overall WFL for tasks assessed   Lower Extremity Assessment Lower Extremity Assessment: Generalized weakness         Cognition Arousal: Alert Behavior During Therapy: Flat affect Overall Cognitive Status: History of cognitive impairments - at baseline                                 General Comments: delayed response, delayed inititation, follows all commands with extra time throughout session                   Pertinent Vitals/ Pain       Pain Assessment Pain Assessment: 0-10 Pain Score: 6  Pain Location: bottom Pain Descriptors / Indicators: Discomfort, Grimacing Pain Intervention(s): Limited activity within patient's tolerance, Patient requesting pain meds-RN notified   Frequency  Min 1X/week  Progress Toward Goals  OT Goals(current goals can now be found in the care plan section)  Progress towards OT goals: Progressing toward goals  Acute Rehab OT Goals Patient Stated Goal: to go home OT Goal Formulation: With patient Time For Goal Achievement: 11/17/23 Potential to Achieve Goals: Good ADL Goals Pt Will Perform Grooming: with set-up;with supervision;standing Pt Will Perform Lower Body Dressing: with contact guard assist;sit to/from stand;with adaptive equipment Pt Will Transfer to Toilet: with modified independence;ambulating;regular height toilet  Plan      Co-evaluation                 AM-PAC OT "6 Clicks" Daily Activity      Outcome Measure   Help from another person eating meals?: None Help from another person taking care of personal grooming?: A Little Help from another person toileting, which includes using toliet, bedpan, or urinal?: A Lot Help from another person bathing (including washing, rinsing, drying)?: A Lot Help from another person to put on and taking off regular upper body clothing?: A Little Help from another person to put on and taking off regular lower body clothing?: A Lot 6 Click Score: 16    End of Session Equipment Utilized During Treatment: Rolling walker (2 wheels)  OT Visit Diagnosis: Other abnormalities of gait and mobility (R26.89);Muscle weakness (generalized) (M62.81)   Activity Tolerance Patient tolerated treatment well   Patient Left in chair;with call bell/phone within reach;with chair alarm set   Nurse Communication Patient requests pain meds        Time: 4782-9562 OT Time Calculation (min): 26 min  Charges: OT General Charges $OT Visit: 1 Visit OT Evaluation $OT Re-eval: 1 Re-eval OT Treatments $Self Care/Home Management : 8-22 mins  Kathie Dike, M.S. OTR/L  11/03/23, 11:10 AM  ascom [phone]

## 2023-11-03 NOTE — Progress Notes (Signed)
Physical Therapy Treatment Patient Details Name: Joshua Berry MRN: [id number] DOB: [birth year] Today's Date: 11/03/2023   History of Present Illness [name] is a 77 year old male with history of liver cirrhosis, hypertension, history of GI bleed, thrombocytopenia, insulin-dependent diabetes mellitus, GERD, who presents emergency department for chief concerns of altered mental status. MD assessment includes ICH, hepatic encephalopathy, hypothyroidism, hypomagnesemia, and physical deconditioning.    PT Comments  Pt pleasant and motivated to increase his ambulation distance/activity today.  Needed little encouragement to perform multiple bouts of ambulation and though he continues to be unable to attain a fully upright posture (with associated UE fatigue using walker) and b/l lacked TKE he was able to increase ambulation distance from 65, to 75, to 26ft with subsequent gait training efforts.  He was able to improve posture/cadence temporarily with cuing and encouragement but ultimately weakness and baseline posturing resulted in devolving postural status and leaning on walker.  Pt will benefit from continued PT to address functional limitations, POC remains appropriate.    If plan is discharge home, recommend the following: Assistance with cooking/housework;Assist for transportation;A lot of help with walking and/or transfers;A little help with bathing/dressing/bathroom   Can travel by private vehicle     No  Equipment Recommendations  None recommended by PT    Recommendations for Other Services       Precautions / Restrictions Precautions Precautions: Fall Restrictions Weight Bearing Restrictions: No     Mobility  Bed Mobility               General bed mobility comments: NT, pt upright in recliner upon arrival and at conclusion of session.    Transfers Overall transfer level: Needs assistance Equipment used: Rolling walker (2 wheels) Transfers: Sit to/from Stand Sit  to Stand: Min assist           General transfer comment: Pt showed good effortwith multiple standing efforts from standard height recliner but could not rise even with multiple attempts and cuing for set up and sequencing - definite need for assist.  Light minA to help shift hips forward and up    Ambulation/Gait Ambulation/Gait assistance: Min assist, Contact guard assist Gait Distance (Feet): 80 Feet Assistive device: Rolling walker (2 wheels) Gait Pattern/deviations: Trunk flexed, Knee flexed in stance - right, Knee flexed in stance - left       General Gait Details: Pt continues to display heavy forward lean and inability to attain full upright but ultimately showed great effort and was eager to do as much as he could.  He went 59ft, 4ft and 57ft with rest break inbetween.  Pt with inconsistent ability to sometimes maintain consistent cadence/walker movement and at times more stop-go, reports fatigue in arms as biggest limiter.  VSS with SpO2 remaining in the high 90s on room air.   Stairs             Wheelchair Mobility     Tilt Bed    Modified Rankin (Stroke Patients Only)       Balance Overall balance assessment: Needs assistance Sitting-balance support: Feet supported, Bilateral upper extremity supported Sitting balance-Leahy Scale: Good     Standing balance support: Bilateral upper extremity supported, Reliant on assistive device for balance Standing balance-Leahy Scale: Fair                              Cognition Arousal: Alert Behavior During Therapy: WFL for tasks assessed/performed Overall Cognitive  Status: History of cognitive impairments - at baseline                                 General Comments: delayed but appropraite responses, delayed inititation, follows all commands with extra time throughout session        Exercises      General Comments General comments (skin integrity, edema, etc.): Pt lacks TKE b/l,  unable to attain fully upright, but overall showed good effort and motivation to trial more prolonged bouts of ambulation      Pertinent Vitals/Pain Pain Assessment Pain Assessment: Faces Faces Pain Scale: Hurts little more Pain Location: general baseline joint stiffness Pain Intervention(s): Limited activity within patient's tolerance    Home Living                          Prior Function            PT Goals (current goals can now be found in the care plan section) Progress towards PT goals: Progressing toward goals    Frequency    Min 1X/week      PT Plan      Co-evaluation              AM-PAC PT "6 Clicks" Mobility   Outcome Measure  Help needed turning from your back to your side while in a flat bed without using bedrails?: A Little Help needed moving from lying on your back to sitting on the side of a flat bed without using bedrails?: A Little Help needed moving to and from a bed to a chair (including a wheelchair)?: A Little Help needed standing up from a chair using your arms (e.g., wheelchair or bedside chair)?: A Little Help needed to walk in hospital room?: A Little Help needed climbing 3-5 steps with a railing? : A Lot 6 Click Score: 17    End of Session Equipment Utilized During Treatment: Gait belt Activity Tolerance: Patient tolerated treatment well;Patient limited by fatigue Patient left: in chair;with chair alarm set;with call bell/phone within reach Nurse Communication: Mobility status PT Visit Diagnosis: Muscle weakness (generalized) (M62.81);Difficulty in walking, not elsewhere classified (R26.2);Unsteadiness on feet (R26.81);Other abnormalities of gait and mobility (R26.89) Pain - Right/Left: Left Pain - part of body: Knee     Time: 7829-5621 PT Time Calculation (min) (ACUTE ONLY): 28 min  Charges:    $Gait Training: 8-22 mins $Therapeutic Activity: 8-22 mins PT General Charges $$ ACUTE PT VISIT: 1 Visit                      Malachi Pro, DPT 11/03/2023, 4:14 PM

## 2023-11-03 NOTE — Plan of Care (Signed)
  Problem: Fluid Volume: Goal: Ability to maintain a balanced intake and output will improve Outcome: Progressing   

## 2023-11-03 NOTE — TOC Progression Note (Signed)
Transition of Care [facility]) - Progression Note    Patient Details  Name: Joshua Berry MRN: [id number] Date of Birth: [birth year]  Transition of Care [facility]) CM/SW Contact  Allena Katz, LCSW Phone Number: 11/03/2023, 12:26 PM  Clinical Narrative:   Additonal message left this morning with facility in [place]. No update. Daughter would be agreeable to [place] trying [facility] facility. Referral faxed to [facility].    Expected Discharge Plan: Skilled Nursing Facility    Expected Discharge Plan and Services         Expected Discharge Date: 10/24/23                                     Social Determinants of Health (SDOH) Interventions SDOH Screenings   Food Insecurity: Patient Declined (10/17/2023)  Housing: Patient Declined (10/17/2023)  Transportation Needs: Patient Declined (10/17/2023)  Utilities: Patient Declined (10/17/2023)  Depression (PHQ2-9): Low Risk  (04/24/2023)  Financial Resource Strain: Low Risk  (08/06/2023)   Received from [facility]  Social Connections: Unknown (04/29/2022)   Received from [facility], [facility]  Tobacco Use: Medium Risk (10/17/2023)    Readmission Risk Interventions    09/15/2023    8:54 AM 08/14/2023    2:18 PM 07/15/2023    8:52 AM  Readmission Risk Prevention Plan  Transportation Screening Complete Complete Complete  PCP or Specialist Appt within 3-5 Days Complete Complete Complete  HRI or Home Care Consult Complete Complete   Social Work Consult for Recovery Care Planning/Counseling Not Complete Complete Complete  Palliative Care Screening Not Applicable Not Applicable Not Applicable  Medication Review [employer]) Not Complete Complete Referral to Pharmacy  Med Review Comments Patient confused and disoriented.

## 2023-11-04 DIAGNOSIS — S065XAA Traumatic subdural hemorrhage with loss of consciousness status unknown, initial encounter: Secondary | ICD-10-CM | POA: Diagnosis not present

## 2023-11-04 DIAGNOSIS — D61818 Other pancytopenia: Secondary | ICD-10-CM | POA: Diagnosis not present

## 2023-11-04 DIAGNOSIS — K7682 Hepatic encephalopathy: Secondary | ICD-10-CM | POA: Diagnosis not present

## 2023-11-04 LAB — GLUCOSE, CAPILLARY
Glucose-Capillary: 125 mg/dL — ABNORMAL HIGH (ref 70–99)
Glucose-Capillary: 231 mg/dL — ABNORMAL HIGH (ref 70–99)
Glucose-Capillary: 180 mg/dL — ABNORMAL HIGH (ref 70–99)
Glucose-Capillary: 299 mg/dL — ABNORMAL HIGH (ref 70–99)

## 2023-11-04 MED ORDER — LACTULOSE 10 GM/15ML PO SOLN
45.0000 g | Freq: Three times a day (TID) | ORAL | Status: DC
  Administered 2023-11-04 – 2023-11-10 (×17): 45 g via ORAL
  Filled 2023-11-04 (×17): qty 90

## 2023-11-04 NOTE — Care Management Important Message (Signed)
Important Message  Patient Details  Name: Joshua Berry MRN: [id number] Date of Birth: January 04, 1946   Important Message Given:  Yes - Medicare IM     [name] 11/04/2023, 10:29 AM

## 2023-11-04 NOTE — Progress Notes (Signed)
Progress Note   [name] ZOX:[id number] DOB: 11-Jul-1946 DOA: 10/16/2023     17 DOS: the patient was seen and examined on 11/04/2023   Brief hospital course: Joshua Berry is a 77 yo with h/o NASH cirrhosis, HTN, and DM who presented on 10/31 with AMS.  NH4 89, improving slowly with high doses of lactulose.  CT head showed mixed density subdural hematomas overlying bilateral cerebral hemispheres, stable since 09/15/2023; however there are hyperdense components within both subdural hematoma suggesting interval hemorrhage into these collections.  Repeat CT head after 6 hours did not show any changes.  Patient seen by neurosurgery, no need for surgery intervention.  His confusion resolved. Mental status much improved. Discharged on 11/8 to [facility] facility who did not accept him. TOC currently working on [place] placement.  Assessment and Plan: AMS Hepatic encephalopathy Chronic SDH Patient presenting with encephalopathy as evidenced by transient confusion and word-finding difficulty. This appears to have been associated with hepatic encephalopathy, and chronic SDH may be contributing. Patient was admitted for ongoing evaluation and management. was treated for encephalopathy with lactulose titrated to 2-4 soft stools/day - currently on 45g q8h with hold parameters. Advised compliance. Continue Rifaximin. [facility]. TOC working on [place] placement. Son asked about his prognosis, and if he can go to hospice. Will get Palliative follow up.   Cirrhosis MELD/MELD-Na score is 12/15, with a mortality rate of 6.6% Platelets 45, generally stable Continue Lasix, Rifaximin, Lactulose. Palliative follow up for disposition plan.   Intracranial hemorrhage Repeat CT of the head without contrast showed no new changes. No surgical intervention is needed per neurosurgery team.   Uncontrolled type 2 diabetes with hyperglycemia A1c 5.9, good control Continue  Glucophage   GERD without esophagitis Continue Protonix   Hypothyroidism Continue levothyroxine   Hypomagnesemia Takes 800 mg PO magnesium BID at home Repeat mag level ordered.  Out of bed to chair. Incentive spirometry. Nursing supportive care. Fall, aspiration precautions. DVT prophylaxis   Code Status: Limited: Do not attempt resuscitation (DNR) -DNR-LIMITED -Do Not Intubate/DNI   Subjective: Patient is seen and examined today morning. He is lying in bed. Eating fair. Loose bowel per RN. No overnight issues.  Physical Exam: Vitals:   11/03/23 2029 11/04/23 0333 11/04/23 0827 11/04/23 1230  BP: (!) 100/56 (!) 103/56 (!) 102/59 (!) 126/58  Pulse: 62 (!) 56 (!) 58 68  Resp: 18 18 16 16   Temp: 97.7 F (36.5 C) 97.6 F (36.4 C) (!) 97.5 F (36.4 C) 98.3 F (36.8 C)  TempSrc:   Oral Oral  SpO2: 100% 97% 100% 100%  Weight:      Height:        General - Elderly Caucasian male, no apparent distress HEENT - PERRLA, EOMI, atraumatic head, non tender sinuses. Lung - Clear, diffuse rales, no rhonchi, wheezes. Heart - S1, S2 heard, no murmurs, rubs, trace pedal edema. Abdomen - Soft, non tender, bowel sounds good Neuro - Alert, awake and oriented x 3, non focal exam. Skin - Warm and dry.  Data Reviewed:      Latest Ref Rng & Units 10/26/2023    3:45 AM 10/24/2023    5:06 AM 10/23/2023    8:35 AM  CBC  WBC 4.0 - 10.5 K/uL 2.1  2.3  2.3   Hemoglobin 13.0 - 17.0 g/dL 9.1  9.7  9.5   Hematocrit 39.0 - 52.0 % 26.4  27.8  27.1   Platelets  150 - 400 K/uL 43  46  44       Latest Ref Rng & Units 10/31/2023    3:19 PM 10/26/2023    3:45 AM 10/24/2023    5:06 AM  BMP  Glucose 70 - 99 mg/dL 098  119  147   BUN 8 - 23 mg/dL 15  12  13    Creatinine 0.61 - 1.24 mg/dL 8.29  5.62  1.30   Sodium 135 - 145 mmol/L 138  138  139   Potassium 3.5 - 5.1 mmol/L 3.4  3.7  3.6   Chloride 98 - 111 mmol/L 110  108  110   CO2 22 - 32 mmol/L 20  24  22    Calcium 8.9 - 10.3 mg/dL 8.8  8.6   8.8    No results found.   Family Communication: Discussed with patient, son over phone. They  understand and agree. All questions answereed.  Disposition: Status is: Inpatient Remains inpatient appropriate because: awaiting [facility].  Planned Discharge Destination: Skilled nursing facility     Time spent: 26 minutes  Author: Marcelino Duster, MD 11/04/2023 3:47 PM Secure chat 7am to 7pm For on call review [email or web address].

## 2023-11-04 NOTE — Plan of Care (Signed)
  Problem: Education: Goal: Ability to describe self-care measures that may prevent or decrease complications (Diabetes Survival Skills Education) will improve Outcome: Progressing Goal: Individualized Educational Video(s) Outcome: Progressing   Problem: Coping: Goal: Ability to adjust to condition or change in health will improve Outcome: Progressing   Problem: Fluid Volume: Goal: Ability to maintain a balanced intake and output will improve Outcome: Progressing   Problem: Health Behavior/Discharge Planning: Goal: Ability to identify and utilize available resources and services will improve Outcome: Progressing Goal: Ability to manage health-related needs will improve Outcome: Progressing   Problem: Metabolic: Goal: Ability to maintain appropriate glucose levels will improve Outcome: Progressing   Problem: Nutritional: Goal: Maintenance of adequate nutrition will improve Outcome: Progressing Goal: Progress toward achieving an optimal weight will improve Outcome: Progressing   Problem: Skin Integrity: Goal: Risk for impaired skin integrity will decrease Outcome: Progressing   Problem: Tissue Perfusion: Goal: Adequacy of tissue perfusion will improve Outcome: Progressing   Problem: Education: Goal: Knowledge of General Education information will improve Description: Including pain rating scale, medication(s)/side effects and non-pharmacologic comfort measures Outcome: Progressing   Problem: Health Behavior/Discharge Planning: Goal: Ability to manage health-related needs will improve Outcome: Progressing   Problem: Clinical Measurements: Goal: Ability to maintain clinical measurements within normal limits will improve Outcome: Progressing Goal: Will remain free from infection Outcome: Progressing Goal: Diagnostic test results will improve Outcome: Progressing Goal: Respiratory complications will improve Outcome: Progressing Goal: Cardiovascular complication will  be avoided Outcome: Progressing   Problem: Activity: Goal: Risk for activity intolerance will decrease Outcome: Progressing   Problem: Nutrition: Goal: Adequate nutrition will be maintained Outcome: Progressing   Problem: Coping: Goal: Level of anxiety will decrease Outcome: Progressing   Problem: Elimination: Goal: Will not experience complications related to bowel motility Outcome: Progressing Goal: Will not experience complications related to urinary retention Outcome: Progressing   Problem: Pain Management: Goal: General experience of comfort will improve Outcome: Progressing   Problem: Safety: Goal: Ability to remain free from injury will improve Outcome: Progressing   Problem: Skin Integrity: Goal: Risk for impaired skin integrity will decrease Outcome: Progressing   Problem: Coping: Goal: Ability to adjust to condition or change in health will improve Outcome: Progressing   Problem: Nutritional: Goal: Maintenance of adequate nutrition will improve Outcome: Progressing   Problem: Skin Integrity: Goal: Risk for impaired skin integrity will decrease Outcome: Progressing   Problem: Tissue Perfusion: Goal: Adequacy of tissue perfusion will improve Outcome: Progressing

## 2023-11-04 NOTE — TOC Progression Note (Signed)
Transition of Care [facility]) - Progression Note    Patient Details  Name: MARVEN CORNER MRN: [id number] Date of Birth: 1946/09/26  Transition of Care [facility]) CM/SW Contact  Allena Katz, LCSW Phone Number: 11/04/2023, 11:35 AM  Clinical Narrative:   CSW spoke with Tresa Endo at [place] re[facility] who states she will review referral today and get back to me.    Expected Discharge Plan: Skilled Nursing Facility    Expected Discharge Plan and Services         Expected Discharge Date: 10/24/23                                     Social Determinants of Health (SDOH) Interventions SDOH Screenings   Food Insecurity: Patient Declined (10/17/2023)  Housing: Patient Declined (10/17/2023)  Transportation Needs: Patient Declined (10/17/2023)  Utilities: Patient Declined (10/17/2023)  Depression (PHQ2-9): Low Risk  (04/24/2023)  Financial Resource Strain: Low Risk  (08/06/2023)   Received from [facility]  Social Connections: Unknown (04/29/2022)   Received from [facility]  Tobacco Use: Medium Risk (10/17/2023)    Readmission Risk Interventions    09/15/2023    8:54 AM 08/14/2023    2:18 PM 07/15/2023    8:52 AM  Readmission Risk Prevention Plan  Transportation Screening Complete Complete Complete  PCP or Specialist Appt within 3-5 Days Complete Complete Complete  HRI or Home Care Consult Complete Complete   Social Work Consult for Recovery Care Planning/Counseling Not Complete Complete Complete  Palliative Care Screening Not Applicable Not Applicable Not Applicable  Medication Review Oceanographer) Not Complete Complete Referral to Pharmacy  Med Review Comments Patient confused and disoriented.

## 2023-11-04 NOTE — Progress Notes (Signed)
Notified MD Sreeram, that Son would like a call has questions about dc plan. [phone]

## 2023-11-04 NOTE — Care Management Important Message (Signed)
Important Message  Patient Details  Name: Joshua Berry MRN: [id number] Date of Birth: [birth year]   Important Message Given:  Yes - [employer] IM     [name] 11/04/2023, 10:28 AM

## 2023-11-04 NOTE — TOC Progression Note (Addendum)
Transition of Care [facility]) - Progression Note    Patient Details  Name: Joshua Berry MRN: [id number] Date of Birth: 27-Nov-1946  Transition of Care [facility]) CM/SW Contact  Allena Katz, LCSW Phone Number: 11/04/2023, 3:24 PM  Clinical Narrative:   Second message left with [place]. Still no call back from facility in [place]. Daughter [name] emailed that we still do not have an update. Message also left with susan cobern to see what to do as these facilities are not returning our calls.     Expected Discharge Plan: Skilled Nursing Facility    Expected Discharge Plan and Services         Expected Discharge Date: 10/24/23                                     Social Determinants of Health (SDOH) Interventions SDOH Screenings   Food Insecurity: Patient Declined (10/17/2023)  Housing: Patient Declined (10/17/2023)  Transportation Needs: Patient Declined (10/17/2023)  Utilities: Patient Declined (10/17/2023)  Depression (PHQ2-9): Low Risk  (04/24/2023)  Financial Resource Strain: Low Risk  (08/06/2023)   Received from [facility]  Social Connections: Unknown (04/29/2022)   Received from [facility]  Tobacco Use: Medium Risk (10/17/2023)    Readmission Risk Interventions    09/15/2023    8:54 AM 08/14/2023    2:18 PM 07/15/2023    8:52 AM  Readmission Risk Prevention Plan  Transportation Screening Complete Complete Complete  PCP or Specialist Appt within 3-5 Days Complete Complete Complete  HRI or Home Care Consult Complete Complete   Social Work Consult for Recovery Care Planning/Counseling Not Complete Complete Complete  Palliative Care Screening Not Applicable Not Applicable Not Applicable  Medication Review Oceanographer) Not Complete Complete Referral to Pharmacy  Med Review Comments Patient confused and disoriented.

## 2023-11-04 NOTE — Plan of Care (Signed)
  Problem: Fluid Volume: Goal: Ability to maintain a balanced intake and output will improve Outcome: Progressing   Problem: Nutritional: Goal: Maintenance of adequate nutrition will improve Outcome: Progressing   Problem: Skin Integrity: Goal: Risk for impaired skin integrity will decrease Outcome: Progressing   

## 2023-11-04 NOTE — Progress Notes (Signed)
Mobility Specialist - Progress Note   11/04/23 1504  Mobility  Activity Ambulated with assistance in hallway  Level of Assistance Contact guard assist, steadying assist  Assistive Device Front wheel walker  Distance Ambulated (ft) 160 ft  Activity Response Tolerated well  $Mobility charge 1 Mobility  Mobility Specialist Start Time (ACUTE ONLY) 1406  Mobility Specialist Stop Time (ACUTE ONLY) 1422  Mobility Specialist Time Calculation (min) (ACUTE ONLY) 16 min   Pt sitting in the recliner upon entry, utilizing RA. Pt agreeable to amb in the hallway this date, required encouragement from MS and daughter to participate in activity. Pt STS to RW ModA, amb 160 ft in 2 bouts with chair follow-- min cueing for head placement during amb. Pt stopped ~60 ft and ~100 ft into amb for a seated rest break-- requiring more assist with each STS from the recliner. Once outside of room, Pt returned seated in the recliner and wheeled into the room. Pt left seated in the recliner with alarm set and needs within reach.  Zetta Bills Mobility Specialist 11/04/23 3:10 PM

## 2023-11-05 DIAGNOSIS — D61818 Other pancytopenia: Secondary | ICD-10-CM | POA: Diagnosis not present

## 2023-11-05 DIAGNOSIS — E782 Mixed hyperlipidemia: Secondary | ICD-10-CM | POA: Diagnosis not present

## 2023-11-05 DIAGNOSIS — K7682 Hepatic encephalopathy: Secondary | ICD-10-CM | POA: Diagnosis not present

## 2023-11-05 DIAGNOSIS — S065XAA Traumatic subdural hemorrhage with loss of consciousness status unknown, initial encounter: Secondary | ICD-10-CM | POA: Diagnosis not present

## 2023-11-05 LAB — GLUCOSE, CAPILLARY
Glucose-Capillary: 112 mg/dL — ABNORMAL HIGH (ref 70–99)
Glucose-Capillary: 146 mg/dL — ABNORMAL HIGH (ref 70–99)
Glucose-Capillary: 176 mg/dL — ABNORMAL HIGH (ref 70–99)
Glucose-Capillary: 176 mg/dL — ABNORMAL HIGH (ref 70–99)

## 2023-11-05 NOTE — Progress Notes (Signed)
Progress Note   [name] YQI:[id number] DOB: 01/04/1946 DOA: 10/16/2023     18 DOS: the patient was seen and examined on 11/05/2023   Brief hospital course: Joshua Berry is a 77 yo with h/o NASH cirrhosis, HTN, and DM who presented on 10/31 with AMS.  NH4 89, improving slowly with high doses of lactulose.  CT head showed mixed density subdural hematomas overlying bilateral cerebral hemispheres, stable since 09/15/2023; however there are hyperdense components within both subdural hematoma suggesting interval hemorrhage into these collections.  Repeat CT head after 6 hours did not show any changes.  Patient seen by neurosurgery, no need for surgery intervention.  His confusion resolved. Mental status much improved. Discharged on 11/8 to [facility] facility who did not accept him. TOC currently working on [place] placement.  Assessment and Plan: AMS Hepatic encephalopathy Chronic SDH Patient presenting with encephalopathy as evidenced by transient confusion and word-finding difficulty. This appears to have been associated with hepatic encephalopathy, and chronic SDH may be contributing. Patient was admitted for ongoing evaluation and management. was treated for encephalopathy with lactulose titrated to 2-4 soft stools/day - currently on 45g q8h with hold parameters. Advised compliance. Continue Rifaximin. [facility]. TOC working on [place] placement. Son had asked about his prognosis, and if he can go to hospice. Will get Palliative rec/s for tomorrow   Cirrhosis MELD/MELD-Na score is 12/15, with a mortality rate of 6.6% Platelets 45, generally stable Continue Lasix, Rifaximin, Lactulose.   Intracranial hemorrhage Repeat CT of the head without contrast showed no new changes. No surgical intervention is needed per neurosurgery team.   Uncontrolled type 2 diabetes with hyperglycemia A1c 5.9, good control Continue Glucophage   GERD without  esophagitis Continue Protonix   Hypothyroidism Continue levothyroxine   Hypomagnesemia Takes 800 mg PO magnesium BID at home Repeat mag level ordered.  Out of bed to chair. Incentive spirometry. Nursing supportive care. Fall, aspiration precautions. DVT prophylaxis   Code Status: Limited: Do not attempt resuscitation (DNR) -DNR-LIMITED -Do Not Intubate/DNI   Subjective: no new issues. Very pleasant  Physical Exam: Vitals:   11/05/23 0434 11/05/23 0824 11/05/23 1641 11/05/23 2009  BP: (!) 107/53 115/63 128/65 (!) 133/54  Pulse: (!) 48 (!) 59 71 70  Resp: 18 14 15 18   Temp: 97.7 F (36.5 C) 97.8 F (36.6 C)  98.3 F (36.8 C)  TempSrc: Oral     SpO2: 97% 100% 99% 100%  Weight:      Height:        General - Elderly Caucasian male, no apparent distress HEENT - PERRLA, EOMI, atraumatic head, non tender sinuses. Lung - Clear, diffuse rales, no rhonchi, wheezes. Heart - S1, S2 heard, no murmurs, rubs, trace pedal edema. Abdomen - Soft, non tender, bowel sounds good Neuro - Alert, awake and oriented x 3, non focal exam. Skin - Warm and dry.  Data Reviewed:      Latest Ref Rng & Units 10/26/2023    3:45 AM 10/24/2023    5:06 AM 10/23/2023    8:35 AM  CBC  WBC 4.0 - 10.5 K/uL 2.1  2.3  2.3   Hemoglobin 13.0 - 17.0 g/dL 9.1  9.7  9.5   Hematocrit 39.0 - 52.0 % 26.4  27.8  27.1   Platelets 150 - 400 K/uL 43  46  44       Latest Ref Rng & Units 10/31/2023    3:19 PM  10/26/2023    3:45 AM 10/24/2023    5:06 AM  BMP  Glucose 70 - 99 mg/dL 295  621  308   BUN 8 - 23 mg/dL 15  12  13    Creatinine 0.61 - 1.24 mg/dL 6.57  8.46  9.62   Sodium 135 - 145 mmol/L 138  138  139   Potassium 3.5 - 5.1 mmol/L 3.4  3.7  3.6   Chloride 98 - 111 mmol/L 110  108  110   CO2 22 - 32 mmol/L 20  24  22    Calcium 8.9 - 10.3 mg/dL 8.8  8.6  8.8    No results found.   Family Communication: Discussed with patient, son over phone. They  understand and agree. All questions  answereed.  Disposition: Status is: Inpatient Remains inpatient appropriate because[facility].  Planned Discharge Destination: Skilled nursing facility     Time spent: 20 minutes  Author: Delfino Lovett, MD 11/05/2023 9:18 PM Secure chat 7am to 7pm For on call review [email or web address].

## 2023-11-05 NOTE — TOC Progression Note (Addendum)
Transition of Care [facility]) - Progression Note    Patient Details  Name: Joshua Berry MRN: [id number] Date of Birth: [birth year]  Transition of Care [facility]) CM/SW Contact  Allena Katz, LCSW Phone Number: 11/05/2023, 3:49 PM  Clinical Narrative:  CSW spoke with the DON at [facility] in [place] who reports she will have kelly in admissions give me a call. Several days have went by with no response.   CSW has also left a VM with the DON of [facility] [place] as it has been 6 days since I have received a call from them.   Expected Discharge Plan: Skilled Nursing Facility    Expected Discharge Plan and Services         Expected Discharge Date: 10/24/23                                     Social Determinants of Health (SDOH) Interventions SDOH Screenings   Food Insecurity: Patient Declined (10/17/2023)  Housing: Patient Declined (10/17/2023)  Transportation Needs: Patient Declined (10/17/2023)  Utilities: Patient Declined (10/17/2023)  Depression (PHQ2-9): Low Risk  (04/24/2023)  Financial Resource Strain: Low Risk  (08/06/2023)   Received [facility]  Social Connections: Unknown (04/29/2022)   [facility] from [facility]  Tobacco Use: Medium Risk (10/17/2023)    Readmission Risk Interventions    09/15/2023    8:54 AM 08/14/2023    2:18 PM 07/15/2023    8:52 AM  Readmission Risk Prevention Plan  Transportation Screening Complete Complete Complete  PCP or Specialist Appt within 3-5 Days Complete Complete Complete  HRI or Home Care Consult Complete Complete   Social Work Consult for Recovery Care Planning/Counseling Not Complete Complete Complete  Palliative Care Screening Not Applicable Not Applicable Not Applicable  Medication Review Oceanographer) Not Complete Complete Referral to Pharmacy  Med Review Comments Patient confused and disoriented.

## 2023-11-05 NOTE — Progress Notes (Signed)
PT Cancellation Note  Patient Details Name: Joshua Berry MRN: [id number] DOB: 1946/08/28   Cancelled Treatment:     Therapist in to see pt this pm, pt declined OOB activity. Unable to encourage participation. Will re-attempt next available date/time per POC.   Jannet Askew 11/05/2023, 4:58 PM

## 2023-11-05 NOTE — Plan of Care (Signed)
  Problem: Education: Goal: Ability to describe self-care measures that may prevent or decrease complications (Diabetes Survival Skills Education) will improve Outcome: Progressing Goal: Individualized Educational Video(s) Outcome: Progressing   Problem: Coping: Goal: Ability to adjust to condition or change in health will improve Outcome: Progressing   Problem: Fluid Volume: Goal: Ability to maintain a balanced intake and output will improve Outcome: Progressing   Problem: Health Behavior/Discharge Planning: Goal: Ability to identify and utilize available resources and services will improve Outcome: Progressing Goal: Ability to manage health-related needs will improve Outcome: Progressing   Problem: Metabolic: Goal: Ability to maintain appropriate glucose levels will improve Outcome: Progressing   Problem: Nutritional: Goal: Maintenance of adequate nutrition will improve Outcome: Progressing Goal: Progress toward achieving an optimal weight will improve Outcome: Progressing   Problem: Skin Integrity: Goal: Risk for impaired skin integrity will decrease Outcome: Progressing   Problem: Tissue Perfusion: Goal: Adequacy of tissue perfusion will improve Outcome: Progressing   Problem: Education: Goal: Knowledge of General Education information will improve Description: Including pain rating scale, medication(s)/side effects and non-pharmacologic comfort measures Outcome: Progressing   Problem: Health Behavior/Discharge Planning: Goal: Ability to manage health-related needs will improve Outcome: Progressing   Problem: Clinical Measurements: Goal: Ability to maintain clinical measurements within normal limits will improve Outcome: Progressing Goal: Will remain free from infection Outcome: Progressing Goal: Diagnostic test results will improve Outcome: Progressing Goal: Respiratory complications will improve Outcome: Progressing Goal: Cardiovascular complication will  be avoided Outcome: Progressing   Problem: Activity: Goal: Risk for activity intolerance will decrease Outcome: Progressing   Problem: Nutrition: Goal: Adequate nutrition will be maintained Outcome: Progressing   Problem: Coping: Goal: Level of anxiety will decrease Outcome: Progressing   Problem: Elimination: Goal: Will not experience complications related to bowel motility Outcome: Progressing Goal: Will not experience complications related to urinary retention Outcome: Progressing   Problem: Pain Management: Goal: General experience of comfort will improve Outcome: Progressing   Problem: Safety: Goal: Ability to remain free from injury will improve Outcome: Progressing   Problem: Skin Integrity: Goal: Risk for impaired skin integrity will decrease Outcome: Progressing   Problem: Health Behavior/Discharge Planning: Goal: Ability to identify and utilize available resources and services will improve Outcome: Progressing   Problem: Nutritional: Goal: Progress toward achieving an optimal weight will improve Outcome: Progressing   Problem: Health Behavior/Discharge Planning: Goal: Ability to manage health-related needs will improve Outcome: Progressing   Problem: Clinical Measurements: Goal: Will remain free from infection Outcome: Progressing

## 2023-11-06 ENCOUNTER — Inpatient Hospital Stay: Payer: Medicare Other

## 2023-11-06 DIAGNOSIS — K7682 Hepatic encephalopathy: Secondary | ICD-10-CM | POA: Diagnosis not present

## 2023-11-06 DIAGNOSIS — Z7189 Other specified counseling: Secondary | ICD-10-CM | POA: Diagnosis not present

## 2023-11-06 DIAGNOSIS — D61818 Other pancytopenia: Secondary | ICD-10-CM | POA: Diagnosis not present

## 2023-11-06 DIAGNOSIS — S065XAA Traumatic subdural hemorrhage with loss of consciousness status unknown, initial encounter: Secondary | ICD-10-CM | POA: Diagnosis not present

## 2023-11-06 DIAGNOSIS — E782 Mixed hyperlipidemia: Secondary | ICD-10-CM | POA: Diagnosis not present

## 2023-11-06 LAB — CBC
HCT: 29 % — ABNORMAL LOW (ref 39.0–52.0)
Hemoglobin: 9.9 g/dL — ABNORMAL LOW (ref 13.0–17.0)
MCH: 31.1 pg (ref 26.0–34.0)
MCHC: 34.1 g/dL (ref 30.0–36.0)
MCV: 91.2 fL (ref 80.0–100.0)
Platelets: 54 10*3/uL — ABNORMAL LOW (ref 150–400)
RBC: 3.18 MIL/uL — ABNORMAL LOW (ref 4.22–5.81)
RDW: 16.4 % — ABNORMAL HIGH (ref 11.5–15.5)
WBC: 2.1 10*3/uL — ABNORMAL LOW (ref 4.0–10.5)
nRBC: 0 % (ref 0.0–0.2)

## 2023-11-06 LAB — BASIC METABOLIC PANEL
Anion gap: 8 (ref 5–15)
BUN: 15 mg/dL (ref 8–23)
CO2: 22 mmol/L (ref 22–32)
Calcium: 8.8 mg/dL — ABNORMAL LOW (ref 8.9–10.3)
Chloride: 109 mmol/L (ref 98–111)
Creatinine, Ser: 0.91 mg/dL (ref 0.61–1.24)
GFR, Estimated: >60 mL/min (ref >60)
Glucose, Bld: 163 mg/dL — ABNORMAL HIGH (ref 70–99)
Potassium: 4.2 mmol/L (ref 3.5–5.1)
Sodium: 139 mmol/L (ref 135–145)

## 2023-11-06 LAB — GLUCOSE, CAPILLARY
Glucose-Capillary: 100 mg/dL — ABNORMAL HIGH (ref 70–99)
Glucose-Capillary: 176 mg/dL — ABNORMAL HIGH (ref 70–99)
Glucose-Capillary: 204 mg/dL — ABNORMAL HIGH (ref 70–99)
Glucose-Capillary: 152 mg/dL — ABNORMAL HIGH (ref 70–99)

## 2023-11-06 LAB — AMMONIA: Ammonia: 136 umol/L — ABNORMAL HIGH (ref 9–35)

## 2023-11-06 MED ORDER — POLYETHYLENE GLYCOL 3350 17 G PO PACK
17.0000 g | PACK | Freq: Every day | ORAL | Status: DC
  Administered 2023-11-06 – 2023-11-10 (×4): 17 g via ORAL
  Filled 2023-11-06 (×5): qty 1

## 2023-11-06 NOTE — Progress Notes (Signed)
Occupational Therapy Treatment Patient Details Name: Joshua Berry MRN: [id number] DOB: [birth year] Today's Date: 11/06/2023   History of present illness Joshua Berry is a 77 year old male with history of liver cirrhosis, hypertension, history of GI bleed, thrombocytopenia, insulin-dependent diabetes mellitus, GERD, who presents emergency department for chief concerns of altered mental status. MD assessment includes ICH, hepatic encephalopathy, hypothyroidism, hypomagnesemia, and physical deconditioning.   OT comments  Mr Skillings was seen for OT treatment on this date. Upon arrival to room pt seated in chair with RN in room with pt incontinent BM, agreeable to tx. Pt requires MAX A don/doff B socks and shoes in sitting. MIN A + RW sit<>stand x3 trials, tolerates standing x7 min x5 min x3 min for MAX A pericare standing. Pt making good progress toward goals, will continue to follow POC. Discharge recommendation remains appropriate.        If plan is discharge home, recommend the following:  A lot of help with bathing/dressing/bathroom;Help with stairs or ramp for entrance;A lot of help with walking and/or transfers   Equipment Recommendations  Other (comment) (defer)    Recommendations for Other Services      Precautions / Restrictions Precautions Precautions: Fall Restrictions Weight Bearing Restrictions: No       Mobility Bed Mobility               General bed mobility comments: not tested    Transfers Overall transfer level: Needs assistance Equipment used: Rolling walker (2 wheels) Transfers: Sit to/from Stand Sit to Stand: Min assist                 Balance Overall balance assessment: Needs assistance Sitting-balance support: Feet supported, Bilateral upper extremity supported Sitting balance-Leahy Scale: Good     Standing balance support: Bilateral upper extremity supported, Reliant on assistive device for balance Standing balance-Leahy Scale: Fair                              ADL either performed or assessed with clinical judgement   ADL Overall ADL's : Needs assistance/impaired                                       General ADL Comments: MAX A don/doff B socks and shoes in sitting. MAX A pericare standing.      Cognition Arousal: Alert Behavior During Therapy: WFL for tasks assessed/performed Overall Cognitive Status: History of cognitive impairments - at baseline                                 General Comments: delayed but appropraite responses, delayed inititation, follows all commands with extra time throughout session                   Pertinent Vitals/ Pain       Pain Assessment Pain Assessment: No/denies pain   Frequency  Min 1X/week        Progress Toward Goals  OT Goals(current goals can now be found in the care plan section)  Progress towards OT goals: Progressing toward goals  Acute Rehab OT Goals Patient Stated Goal: to return to PLOF OT Goal Formulation: With patient Time For Goal Achievement: 11/17/23 Potential to Achieve Goals: Good ADL Goals Pt Will Perform Grooming: with set-up;with supervision;standing Pt Will  Perform Lower Body Dressing: with contact guard assist;sit to/from stand;with adaptive equipment Pt Will Transfer to Toilet: with modified independence;ambulating;regular height toilet  Plan      Co-evaluation                 AM-PAC OT "6 Clicks" Daily Activity     Outcome Measure   Help from another person eating meals?: None Help from another person taking care of personal grooming?: A Little Help from another person toileting, which includes using toliet, bedpan, or urinal?: A Lot Help from another person bathing (including washing, rinsing, drying)?: A Lot Help from another person to put on and taking off regular upper body clothing?: A Little Help from another person to put on and taking off regular lower body clothing?: A  Lot 6 Click Score: 16    End of Session    OT Visit Diagnosis: Other abnormalities of gait and mobility (R26.89);Muscle weakness (generalized) (M62.81)   Activity Tolerance Patient tolerated treatment well   Patient Left in chair;with call bell/phone within reach;with chair alarm set;with nursing/sitter in room   Nurse Communication          Time: 1610-9604 OT Time Calculation (min): 19 min  Charges: OT General Charges $OT Visit: 1 Visit OT Treatments $Self Care/Home Management : 8-22 mins  Kathie Dike, M.S. OTR/L  11/06/23, 3:57 PM  ascom [phone]

## 2023-11-06 NOTE — Progress Notes (Signed)
Mobility Specialist - Progress Note   11/06/23 1128  Mobility  Activity Ambulated with assistance in hallway  Level of Assistance Contact guard assist, steadying assist  Assistive Device Front wheel walker  Distance Ambulated (ft) 20 ft  Activity Response Tolerated well  $Mobility charge 1 Mobility  Mobility Specialist Start Time (ACUTE ONLY) 1053  Mobility Specialist Stop Time (ACUTE ONLY) 1118  Mobility Specialist Time Calculation (min) (ACUTE ONLY) 25 min   Pt supine upon entry, utilizing RA. Pt agreeable to OOB amb this date, expressed R shoulder pain. MS completed peri care and dons adult diaper. Pt completed bed mob ModA to bring BLE EOB, MinA to bring trunk from sitting to supine-- able to self support trunk once seated EOB. Pt STS to RW ModA, extra time required to bring BLE closer to person upon standing. Pt amb 20 ft in the hallway CGA, slow gait with slight flexed trunk-- able to self correct upon cueing. Pt sat in the recliner for a seated rest break due to fatigue, PT acquires session.    Zetta Bills Mobility Specialist 11/06/23 11:43 AM

## 2023-11-06 NOTE — Progress Notes (Signed)
Pt agreeable to activity today, working with OT in am, mobility techs shortly following, and participated in PT session for continued gait and B LE strengthening exercises. Pt continues to struggle with sit<>stand transfers due to bilateral weakness at hips and weight shifting while transitioning to standing. Overall, he is very pleasant and cooperative and continues to progress with skilled PT services.   11/06/23 1600  PT Visit Information  Assistance Needed +1  History of Present Illness [name] is a 77 year old male with history of liver cirrhosis, hypertension, history of GI bleed, thrombocytopenia, insulin-dependent diabetes mellitus, GERD, who presents emergency department for chief concerns of altered mental status. MD assessment includes ICH, hepatic encephalopathy, hypothyroidism, hypomagnesemia, and physical deconditioning.  Subjective Data  Patient Stated Goal get stronger  Restrictions  Weight Bearing Restrictions No  Pain Assessment  Pain Assessment Faces  Faces Pain Scale 2  Pain Location R shoulder after ambulating with RW  Pain Descriptors / Indicators Discomfort;Grimacing  Pain Intervention(s) Relaxation  Cognition  Arousal Alert  Behavior During Therapy WFL for tasks assessed/performed  Overall Cognitive Status History of cognitive impairments - at baseline  General Comments Very pleasant, slightly delayed but appropraite responses, delayed inititation, follows all commands with extra time throughout session  Bed Mobility  General bed mobility comments not tested  Transfers  Overall transfer level Needs assistance  Equipment used Rolling walker (2 wheels)  Transfers Sit to/from Stand  Sit to Stand Mod assist  General transfer comment ModA to stand from low recliner  Ambulation/Gait  Ambulation/Gait assistance Contact guard assist  Gait Distance (Feet) 16 Feet  Assistive device Rolling walker (2 wheels)  Gait Pattern/deviations Trunk flexed;Knee flexed in  stance - right;Knee flexed in stance - left  General Gait Details Bilateral genu-varus noted. Pt  able to stand slightly upright during gait with manual cues to maintain. Fatigues quickly requiring chair to be followed.  Gait velocity decreased  Balance  Overall balance assessment Needs assistance  Sitting-balance support Feet supported;Bilateral upper extremity supported  Sitting balance-Leahy Scale Good  Standing balance support Bilateral upper extremity supported;Reliant on assistive device for balance  Standing balance-Leahy Scale Fair  Standing balance comment heavily reliant on RW for stability.  Was able to maintain static standing while PT assisted with clean up post BM  General Comments  General comments (skin integrity, edema, etc.) Pt very cooperative and pleasant throughout session. Flat affect, however does smile and converse approprately  Exercises  Exercises General Lower Extremity  General Exercises - Lower Extremity  Ankle Circles/Pumps AROM;Both;10 reps;Seated  Long Arc Quad AROM;Both;15 reps;Seated  PT - End of Session  Equipment Utilized During Treatment Gait belt  Activity Tolerance Patient tolerated treatment well;Patient limited by fatigue  Patient left in chair;with chair alarm set;with call bell/phone within reach  Nurse Communication Mobility status   PT - Assessment/Plan  PT Visit Diagnosis Muscle weakness (generalized) (M62.81);Difficulty in walking, not elsewhere classified (R26.2);Unsteadiness on feet (R26.81);Other abnormalities of gait and mobility (R26.89)  Pain - Right/Left Right  Pain - part of body Shoulder  PT Frequency (ACUTE ONLY) Min 1X/week  Follow Up Recommendations Skilled nursing-short term rehab (<3 hours/day)  Can patient physically be transported by private vehicle No  Patient can return home with the following Assistance with cooking/housework;Assist for transportation;A lot of help with walking and/or transfers;A little help with  bathing/dressing/bathroom  PT equipment None recommended by PT  AM-PAC PT "6 Clicks" Mobility Outcome Measure (Version 2)  Help needed turning from your back to your  side while in a flat bed without using bedrails? 3  Help needed moving from lying on your back to sitting on the side of a flat bed without using bedrails? 3  Help needed moving to and from a bed to a chair (including a wheelchair)? 3  Help needed standing up from a chair using your arms (e.g., wheelchair or bedside chair)? 3  Help needed to walk in hospital room? 3  Help needed climbing 3-5 steps with a railing?  2  6 Click Score 17  Consider Recommendation of Discharge To: Home with [facility]  Progressive Mobility  What is the highest level of mobility based on the progressive mobility assessment? Level 5 (Walks with assist in room/hall) - Balance while stepping forward/back and can walk in room with assist - Complete  Mobility Referral Yes  Activity Ambulated with assistance in hallway  PT Goal Progression  Progress towards PT goals Progressing toward goals  PT Time Calculation  PT Start Time (ACUTE ONLY) 1118  PT Stop Time (ACUTE ONLY) 1142  PT Time Calculation (min) (ACUTE ONLY) 24 min  PT General Charges  $$ ACUTE PT VISIT 1 Visit  PT Treatments  $Gait Training 8-22 mins  $Therapeutic Exercise 8-22 mins  Zadie Cleverly, PTA 11/06/2023

## 2023-11-06 NOTE — Progress Notes (Addendum)
Daily Progress Note   Patient Name: Joshua Berry       Date: 11/06/2023 DOB: 1946-10-13  Age: 77 y.o. MRN#: [id number] Attending Physician: Delfino Lovett, MD Primary Care Physician: [facility]mit Date: 10/16/2023  Reason for Consultation/Follow-up: Establishing goals of care  Subjective: Contacted by team regarding prognosis and question of hospice as they are working on discharge planning. Notes reviewed. In to see patient. He is sitting in bed watching t.v. He is eating breakfast at this time. He answers questions when asked, but is focused on t.v. No complaints or distress noted at this time.   AD packet has been scanned under ACP tab.  Recommend outpatient palliative at this time. If family would like patient evaluated by a hospice agency, would recommend calling agency of their choice to discuss.     Length of Stay: 19  Current Medications: Scheduled Meds:   acidophilus  1 capsule Oral Daily   atorvastatin  20 mg Oral QHS   diclofenac Sodium  2 g Topical QID   famotidine  20 mg Oral Daily   furosemide  20 mg Oral Daily   insulin aspart  0-5 Units Subcutaneous QHS   insulin aspart  0-9 Units Subcutaneous TID WC   insulin aspart  4 Units Subcutaneous TID WC   insulin glargine-yfgn  10 Units Subcutaneous QHS   ketotifen  1 drop Both Eyes BID   lactulose  45 g Oral TID   levothyroxine  100 mcg Oral Q0600   lidocaine  1 patch Transdermal Q24H   magnesium oxide  800 mg Oral BID   melatonin  5 mg Oral QHS   pantoprazole  40 mg Oral BID   rifaximin  550 mg Oral BID   senna-docusate  2 tablet Oral BID   terazosin  5 mg Oral QHS    Continuous Infusions:   PRN Meds: acetaminophen, mouth rinse  Physical Exam Pulmonary:     Effort: Pulmonary effort is normal.   Neurological:     Mental Status: He is alert.             Vital Signs: BP 111/71 (BP Location: Left Arm)   Pulse (!) 59   Temp 98.1 F (36.7 C)   Resp 18   Ht 6\' 2"  (1.88 m)   Wt 97.4 kg  SpO2 99%   BMI 27.57 kg/m  SpO2: SpO2: 99 % O2 Device: O2 Device: Room Air O2 Flow Rate: O2 Flow Rate (L/min): 0 L/min  Intake/output summary: No intake or output data in the 24 hours ending 11/06/23 0915 LBM: Last BM Date : 11/04/23 Baseline Weight: Weight: 98.1 kg Most recent weight: Weight: 97.4 kg        Patient Active Problem List   Diagnosis Date Noted   Intracranial hemorrhage (HCC) 10/17/2023   Encephalopathy 09/15/2023   Right wrist pain 08/14/2023   Bilateral subdural hematomas (HCC) 08/13/2023   Malnutrition of moderate degree 08/13/2023   Decubitus ulcer of coccyx, stage I 08/13/2023   Acute metabolic encephalopathy 08/12/2023   Staphylococcus epidermidis bacteremia 07/23/2023   Acute nontraumatic intracranial subdural hematoma (HCC) 07/15/2023   Subdural hematoma (HCC) 07/14/2023   Compression of brain (HCC) 07/14/2023   Uncal herniation (HCC) 07/14/2023   Type 2 diabetes mellitus without complications (HCC) 06/17/2023   GERD without esophagitis 06/17/2023   Dyslipidemia 06/17/2023   Pressure injury of skin 06/17/2023   Acute urinary retention 04/11/2023   Streptococcal bacteremia 04/07/2023   Altered mental status 04/06/2023   Hepatic encephalopathy (HCC) 04/05/2023   Overweight (BMI 25.0-29.9) 08/03/2022   Hyponatremia, hypokalemia and hypomagnesemia 05/18/2022   Hyponatremia 05/18/2022   Hypokalemia 05/18/2022   Back pain 05/17/2022   Hypomagnesemia 05/17/2022   Acute hepatic encephalopathy (HCC) 05/17/2022   AKI (acute kidney injury) (HCC) 05/16/2022   Elevated liver enzymes 05/16/2022   Hypotension 05/16/2022   Right shoulder pain 05/16/2022   Hypothyroidism 05/16/2022   Physical deconditioning 05/16/2022   Obesity (BMI 30-39.9) 05/16/2022   Lactic  acidosis 05/16/2022   Fall 05/16/2022   Septic shock due to Staphylococcus bacteremia 05/14/2022   Chest pain 04/06/2022   Controlled IDDM-2 with hyperglycemia 04/06/2022   HLD (hyperlipidemia) 04/06/2022   Chronic diastolic CHF (congestive heart failure) (HCC) 04/06/2022   Pancytopenia (HCC) 04/06/2022   GI bleeding 04/24/2021   Cirrhosis of liver without ascites (HCC)    Secondary esophageal varices without bleeding (HCC)    Portal hypertension (HCC)    Stomach irritation    Melena    Acute gastrointestinal hemorrhage 07/19/2019   Severe sepsis (HCC) 12/20/2018    Palliative Care Assessment & Plan   Recommendations/Plan: Recommend outpatient palliative to follow on D/C.  If family would like patient evaluated by a hospice agency, would recommend calling agency of their choice to discuss.  PMT will sign off.   Code Status:    Code Status Orders  (From admission, onward)           Start     Ordered   10/16/23 1624  Do not attempt resuscitation (DNR)- Limited -Do Not Intubate (DNI)  Continuous       Question Answer Comment  If pulseless and not breathing No CPR or chest compressions.   In Pre-Arrest Conditions (Patient Is Breathing and Has A Pulse) Do not intubate. Provide all appropriate non-invasive medical interventions. Avoid ICU transfer unless indicated or required.   Consent: Discussion documented in EHR or advanced directives reviewed      10/16/23 1624           Code Status History     Date Active Date Inactive Code Status Order ID Comments User Context   09/15/2023 0907 09/25/2023 2031 Do not attempt resuscitation (DNR) PRE-ARREST INTERVENTIONS DESIRED [id number]  Floydene Flock, MD ED   08/13/2023 0016 08/20/2023 1920 DNR [id number]  Lorretta Harp, MD ED  07/14/2023 2035 08/01/2023 2157 DNR [id number]  Rust-Chester, Cecelia Byars, NP Inpatient   07/14/2023 1908 07/14/2023 2035 Full Code [id number]  Rust-Chester, Cecelia Byars, NP Inpatient   06/17/2023 0505 06/18/2023 2038  DNR [id number]  Hannah Beat, MD ED   04/05/2023 1958 04/17/2023 1914 DNR [id number]  Cox, Amy Dorris Carnes, DO ED   04/05/2023 1800 04/05/2023 1957 Full Code [id number]  Cox, Amy N, DO ED   08/02/2022 2233 08/05/2022 1853 Full Code [id number]  Rometta Emery, MD ED   05/14/2022 1611 05/28/2022 1932 Full Code [id number]  Vida Rigger, MD ED   04/06/2022 1806 04/07/2022 2105 DNR [id number]  Lorretta Harp, MD ED   04/06/2022 1548 04/06/2022 1806 Full Code [id number]  Lorretta Harp, MD ED   04/24/2021 2321 04/26/2021 1902 Full Code [id number]  Mansy, Vernetta Honey, MD ED   08/20/2019 0719 08/22/2019 1952 Full Code [id number]  Arnaldo Natal ED   07/19/2019 1925 07/23/2019 2106 Full Code [id number]  Pearletha Alfred, NP ED   12/20/2018 1949 12/25/2018 2052 Full Code [id number]  Katha Hamming, MD ED       Prognosis:  Unable to determine    Thank you for allowing the Palliative Medicine Team to assist in the care of this patient.     Morton Stall, NP  Please contact Palliative Medicine Team phone at [phone] for questions and concerns.

## 2023-11-06 NOTE — TOC Progression Note (Addendum)
Transition of Care [facility]) - Progression Note    Patient Details  Name: Joshua Berry MRN: [id number] Date of Birth: 1946/01/06  [facility] CM/SW Contact  Allena Katz, LCSW Phone Number: 11/06/2023, 11:05 AM  Clinical Narrative:   Pt has a bed offer for [facility] and rehab. Tresa Endo with facility reports they can take pt tomorrow. CSW will update family and plan transport accordingly. Message left with [facility] to get approval information sent to facility.    Expected Discharge Plan: Skilled Nursing Facility    Expected Discharge Plan and Services         Expected Discharge Date: 10/24/23                                     Social Determinants of Health (SDOH) Interventions SDOH Screenings   Food Insecurity: Patient Declined (10/17/2023)  Housing: Patient Declined (10/17/2023)  Transportation Needs: Patient Declined (10/17/2023)  Utilities: Patient Declined (10/17/2023)  Depression (PHQ2-9): Low Risk  (04/24/2023)  Financial Resource Strain: Low Risk  (08/06/2023)   Received from [facility]  Social Connections: Unknown (04/29/2022)   Received from [facility]  Tobacco Use: Medium Risk (10/17/2023)    Readmission Risk Interventions    09/15/2023    8:54 AM 08/14/2023    2:18 PM 07/15/2023    8:52 AM  Readmission Risk Prevention Plan  Transportation Screening Complete Complete Complete  PCP or Specialist Appt within 3-5 Days Complete Complete Complete  HRI or Home Care Consult Complete Complete   Social Work Consult for Recovery Care Planning/Counseling Not Complete Complete Complete  Palliative Care Screening Not Applicable Not Applicable Not Applicable  Medication Review Oceanographer) Not Complete Complete Referral to Pharmacy  Med Review Comments Patient confused and disoriented.

## 2023-11-06 NOTE — TOC Progression Note (Signed)
Transition of Care [facility]) - Progression Note    Patient Details  Name: Joshua Berry MRN: [id number] Date of Birth: [birth year]  [facility]) CM/SW Contact  Allena Katz, LCSW Phone Number: 11/06/2023, 10:01 AM  Clinical Narrative:    Referral emailed to [place] at [facility] and rehab. [email or web address]    Expected Discharge Plan: Skilled Nursing Facility    Expected Discharge Plan and Services         Expected Discharge Date: 10/24/23                                     Social Determinants of Health (SDOH) Interventions SDOH Screenings   Food Insecurity: Patient Declined (10/17/2023)  Housing: Patient Declined (10/17/2023)  Transportation Needs: Patient Declined (10/17/2023)  Utilities: Patient Declined (10/17/2023)  Depression (PHQ2-9): Low Risk  (04/24/2023)  Financial Resource Strain: Low Risk  (08/06/2023)   Received from [facility]  Social Connections: Unknown (04/29/2022)   Received from [facility]  Tobacco Use: Medium Risk (10/17/2023)    Readmission Risk Interventions    09/15/2023    8:54 AM 08/14/2023    2:18 PM 07/15/2023    8:52 AM  Readmission Risk Prevention Plan  Transportation Screening Complete Complete Complete  PCP or Specialist Appt within 3-5 Days Complete Complete Complete  HRI or Home Care Consult Complete Complete   Social Work Consult for Recovery Care Planning/Counseling Not Complete Complete Complete  Palliative Care Screening Not Applicable Not Applicable Not Applicable  Medication Review Oceanographer) Not Complete Complete Referral to Pharmacy  Med Review Comments Patient confused and disoriented.

## 2023-11-06 NOTE — Plan of Care (Signed)
  Problem: Education: Goal: Ability to describe self-care measures that may prevent or decrease complications (Diabetes Survival Skills Education) will improve Outcome: Progressing Goal: Individualized Educational Video(s) Outcome: Progressing   Problem: Coping: Goal: Ability to adjust to condition or change in health will improve Outcome: Progressing   Problem: Fluid Volume: Goal: Ability to maintain a balanced intake and output will improve Outcome: Progressing   Problem: Health Behavior/Discharge Planning: Goal: Ability to identify and utilize available resources and services will improve Outcome: Progressing   Problem: Health Behavior/Discharge Planning: Goal: Ability to identify and utilize available resources and services will improve Outcome: Progressing Goal: Ability to manage health-related needs will improve Outcome: Progressing   Problem: Skin Integrity: Goal: Risk for impaired skin integrity will decrease Outcome: Progressing   Problem: Tissue Perfusion: Goal: Adequacy of tissue perfusion will improve Outcome: Progressing

## 2023-11-06 NOTE — NC FL2 (Cosign Needed Addendum)
[place] MEDICAID FL2 LEVEL OF CARE FORM     IDENTIFICATION  Patient Name: Joshua Berry Birthdate: 1946-07-15 Sex: male Admission Date (Current Location): 10/16/2023  [place] and [place] Number:  Chiropodist and Address:  [facility], [address], [place], [place] [address]      Provider Number: [id number]  Attending Physician Name and Address:  Delfino Lovett, MD  Relative Name and Phone Number:       Current Level of Care: Hospital Recommended Level of Care: Skilled Nursing Facility with hospice Prior Approval Number:    Date Approved/Denied:   PASRR Number: [id number]  Discharge Plan: SNF    Current Diagnoses: Patient Active Problem List   Diagnosis Date Noted   Intracranial hemorrhage (HCC) 10/17/2023   Encephalopathy 09/15/2023   Right wrist pain 08/14/2023   Bilateral subdural hematomas (HCC) 08/13/2023   Malnutrition of moderate degree 08/13/2023   Decubitus ulcer of coccyx, stage I 08/13/2023   Acute metabolic encephalopathy 08/12/2023   Staphylococcus epidermidis bacteremia 07/23/2023   Acute nontraumatic intracranial subdural hematoma (HCC) 07/15/2023   Subdural hematoma (HCC) 07/14/2023   Compression of brain (HCC) 07/14/2023   Uncal herniation (HCC) 07/14/2023   Type 2 diabetes mellitus without complications (HCC) 06/17/2023   GERD without esophagitis 06/17/2023   Dyslipidemia 06/17/2023   Pressure injury of skin 06/17/2023   Acute urinary retention 04/11/2023   Streptococcal bacteremia 04/07/2023   Altered mental status 04/06/2023   Hepatic encephalopathy (HCC) 04/05/2023   Overweight (BMI 25.0-29.9) 08/03/2022   Hyponatremia, hypokalemia and hypomagnesemia 05/18/2022   Hyponatremia 05/18/2022   Hypokalemia 05/18/2022   Back pain 05/17/2022   Hypomagnesemia 05/17/2022   Acute hepatic encephalopathy (HCC) 05/17/2022   AKI (acute kidney injury) (HCC) 05/16/2022   Elevated liver enzymes 05/16/2022    Hypotension 05/16/2022   Right shoulder pain 05/16/2022   Hypothyroidism 05/16/2022   Physical deconditioning 05/16/2022   Obesity (BMI 30-39.9) 05/16/2022   Lactic acidosis 05/16/2022   Fall 05/16/2022   Septic shock due to Staphylococcus bacteremia 05/14/2022   Chest pain 04/06/2022   Controlled IDDM-2 with hyperglycemia 04/06/2022   HLD (hyperlipidemia) 04/06/2022   Chronic diastolic CHF (congestive heart failure) (HCC) 04/06/2022   Pancytopenia (HCC) 04/06/2022   GI bleeding 04/24/2021   Cirrhosis of liver without ascites (HCC)    Secondary esophageal varices without bleeding (HCC)    Portal hypertension (HCC)    Stomach irritation    Melena    Acute gastrointestinal hemorrhage 07/19/2019   Severe sepsis (HCC) 12/20/2018    Orientation RESPIRATION BLADDER Height & Weight     Self, Time, Situation, Place  Normal Continent Weight: 214 lb 11.7 oz (97.4 kg) Height:  6\' 2"  (188 cm)  BEHAVIORAL SYMPTOMS/MOOD NEUROLOGICAL BOWEL NUTRITION STATUS   (None)  (None) Continent Diet (Heart healthy. Soft foods.)  AMBULATORY STATUS COMMUNICATION OF NEEDS Skin   Limited Assist Verbally Skin abrasions, Bruising, Other (Comment), PU Stage and Appropriate Care (Erythema/redness, rash.)   PU Stage 2 Dressing:  (Mid coccyx: Foam prn.)                   Personal Care Assistance Level of Assistance  Bathing, Feeding, Dressing Bathing Assistance: Maximum assistance Feeding assistance: Limited assistance Dressing Assistance: Maximum assistance     Functional Limitations Info  Sight, Hearing, Speech Sight Info: Adequate Hearing Info: Adequate Speech Info: Adequate    SPECIAL CARE FACTORS FREQUENCY  PT (By licensed PT), OT (By licensed OT)     PT  Frequency: 5 x week OT Frequency: 5 x week            Contractures Contractures Info: Not present    Additional Factors Info  Code Status, Allergies Code Status Info: DNR Allergies Info: Camphor, Latex, Lisinopril,  Sulfamethoxazole, Chocolate, Nsaids           Current Medications (11/06/2023):  This is the current hospital active medication list Current Facility-Administered Medications  Medication Dose Route Frequency Provider Last Rate Last Admin   acetaminophen (TYLENOL) tablet 500 mg  500 mg Oral Q8H PRN Marcelino Duster, MD   500 mg at 11/03/23 1156   acidophilus (RISAQUAD) capsule 1 capsule  1 capsule Oral Daily Cox, Amy N, DO   1 capsule at 11/05/23 0942   atorvastatin (LIPITOR) tablet 20 mg  20 mg Oral Noemi Chapel, MD   20 mg at 11/05/23 2133   diclofenac Sodium (VOLTAREN) 1 % topical gel 2 g  2 g Topical QID Jonah Blue, MD   2 g at 11/05/23 2133   famotidine (PEPCID) tablet 20 mg  20 mg Oral Daily Jonah Blue, MD   20 mg at 11/05/23 1610   furosemide (LASIX) tablet 20 mg  20 mg Oral Daily Jonah Blue, MD   20 mg at 11/05/23 0941   insulin aspart (novoLOG) injection 0-5 Units  0-5 Units Subcutaneous QHS Cox, Amy N, DO   3 Units at 11/04/23 2020   insulin aspart (novoLOG) injection 0-9 Units  0-9 Units Subcutaneous TID WC Cox, Amy N, DO   2 Units at 11/05/23 1702   insulin aspart (novoLOG) injection 4 Units  4 Units Subcutaneous TID WC Marrion Coy, MD   4 Units at 11/05/23 1702   insulin glargine-yfgn (SEMGLEE) injection 10 Units  10 Units Subcutaneous QHS Cox, Amy N, DO   10 Units at 11/05/23 2132   ketotifen (ZADITOR) 0.035 % ophthalmic solution 1 drop  1 drop Both Eyes BID Jonah Blue, MD   1 drop at 11/05/23 2133   lactulose (CHRONULAC) 10 GM/15ML solution 45 g  45 g Oral TID Marcelino Duster, MD   45 g at 11/05/23 2132   levothyroxine (SYNTHROID) tablet 100 mcg  100 mcg Oral Q0600 Cox, Amy N, DO   100 mcg at 11/06/23 0530   lidocaine (LIDODERM) 5 % 1 patch  1 patch Transdermal Q24H Jonah Blue, MD   1 patch at 11/05/23 1254   magnesium oxide (MAG-OX) tablet 800 mg  800 mg Oral BID Jonah Blue, MD   800 mg at 11/05/23 2133   melatonin tablet 5 mg  5  mg Oral QHS Cox, Amy N, DO   5 mg at 11/05/23 2133   Oral care mouth rinse  15 mL Mouth Rinse PRN Jonah Blue, MD       pantoprazole (PROTONIX) EC tablet 40 mg  40 mg Oral BID Cox, Amy N, DO   40 mg at 11/05/23 2133   rifaximin (XIFAXAN) tablet 550 mg  550 mg Oral BID Cox, Amy N, DO   550 mg at 11/05/23 2133   senna-docusate (Senokot-S) tablet 2 tablet  2 tablet Oral BID Marrion Coy, MD   2 tablet at 11/05/23 2133   terazosin (HYTRIN) capsule 5 mg  5 mg Oral QHS Marrion Coy, MD   5 mg at 11/05/23 2133     Discharge Medications: Please see discharge summary for a list of discharge medications.  Relevant Imaging Results:  Relevant Lab Results:   Additional Information SS#: [id number]  Johnney Scarlata Genelle Gather, LCSW

## 2023-11-06 NOTE — Progress Notes (Signed)
Progress Note   [name] WGN:[id number] DOB: 05-06-1946 DOA: 10/16/2023     19 DOS: the patient was seen and examined on 11/06/2023   Brief hospital course: JAMAURION FEENEY is a 77 yo with h/o NASH cirrhosis, HTN, and DM who presented on 10/31 with AMS.  NH4 89, improving slowly with high doses of lactulose.  CT head showed mixed density subdural hematomas overlying bilateral cerebral hemispheres, stable since 09/15/2023; however there are hyperdense components within both subdural hematoma suggesting interval hemorrhage into these collections.  Repeat CT head after 6 hours did not show any changes.  Patient seen by neurosurgery, no need for surgery intervention.  His confusion resolved. Mental status much improved. Discharged on 11/8 to [facility] facility who did not accept him. TOC currently working on [place] placement.  11/21: Family requesting labs.  Potential discharge tomorrow to [facility] and rehab per [facility]  Assessment and Plan: AMS Hepatic encephalopathy Chronic SDH Patient presenting with encephalopathy as evidenced by transient confusion and word-finding difficulty. This appears to have been associated with hepatic encephalopathy, and chronic SDH may be contributing. Patient was admitted for ongoing evaluation and management. was treated for encephalopathy with lactulose titrated to 2-4 soft stools/day - currently on 45g q8h with hold parameters. Advised compliance. Continue Rifaximin. He was discharged 11/8 by [facility] facility unable to accept him. TOC working on [employer] and rehab placement if he gets approved for potential discharge tomorrow Son had asked about his prognosis, and if he can go under hospice care. I have requested TOC to inquire with [facility] health and rehab to see if he can be evaluated for potential hospice care while there   Cirrhosis MELD/MELD-Na score is 12/15, with a mortality rate of 6.6% Platelets stable.  Labs today pending Continue Lasix,  Rifaximin, Lactulose.   Intracranial hemorrhage Repeat CT of the head without contrast showed no new changes. No surgical intervention is needed per neurosurgery team.   Uncontrolled type 2 diabetes with hyperglycemia A1c 5.9, good control Continue Glucophage   GERD without esophagitis Continue Protonix   Hypothyroidism Continue levothyroxine   Hypomagnesemia Repleted   Out of bed to chair. Incentive spirometry. Nursing supportive care. Fall, aspiration precautions. DVT prophylaxis   Code Status: Limited: Do not attempt resuscitation (DNR) -DNR-LIMITED -Do Not Intubate/DNI   Subjective: no new issues.  Seems at baseline  Physical Exam: Vitals:   11/05/23 1641 11/05/23 2009 11/06/23 0402 11/06/23 0800  BP: 128/65 (!) 133/54 108/60 111/71  Pulse: 71 70 60 (!) 59  Resp: 15 18 17 18   Temp:  98.3 F (36.8 C) 98 F (36.7 C) 98.1 F (36.7 C)  TempSrc:   Oral   SpO2: 99% 100% 99% 99%  Weight:      Height:        General - Elderly Caucasian male, no apparent distress HEENT - PERRLA, EOMI, atraumatic head, non tender sinuses. Lung - Clear, diffuse rales, no rhonchi, wheezes. Heart - S1, S2 heard, no murmurs, rubs, trace pedal edema. Abdomen - Soft, non tender, bowel sounds good Neuro - Alert, awake and oriented x 3, non focal exam. Skin - Warm and dry.  Data Reviewed:      Latest Ref Rng & Units 10/26/2023    3:45 AM 10/24/2023    5:06 AM 10/23/2023    8:35 AM  CBC  WBC 4.0 - 10.5 K/uL 2.1  2.3  2.3   Hemoglobin 13.0 - 17.0 g/dL 9.1  9.7  9.5   Hematocrit  39.0 - 52.0 % 26.4  27.8  27.1   Platelets 150 - 400 K/uL 43  46  44       Latest Ref Rng & Units 10/31/2023    3:19 PM 10/26/2023    3:45 AM 10/24/2023    5:06 AM  BMP  Glucose 70 - 99 mg/dL 161  096  045   BUN 8 - 23 mg/dL 15  12  13    Creatinine 0.61 - 1.24 mg/dL 4.09  8.11  9.14   Sodium 135 - 145 mmol/L 138  138  139   Potassium 3.5 - 5.1 mmol/L 3.4  3.7  3.6   Chloride 98 - 111 mmol/L 110  108   110   CO2 22 - 32 mmol/L 20  24  22    Calcium 8.9 - 10.3 mg/dL 8.8  8.6  8.8    No results found.   Family Communication: Discussed with patient's son over phone.  Other family measures were on the speaker phone at the same time.  They  understand and agree. All questions answereed.  Disposition: Status is: Inpatient Remains inpatient appropriate because: awaiting [facility] health and rehab placement  Planned Discharge Destination: Skilled nursing facility     Time spent: 35 minutes  Author: Delfino Lovett, MD 11/06/2023 11:36 AM Secure chat 7am to 7pm For on call review [email or web address].

## 2023-11-07 DIAGNOSIS — S065XAA Traumatic subdural hemorrhage with loss of consciousness status unknown, initial encounter: Secondary | ICD-10-CM | POA: Diagnosis not present

## 2023-11-07 DIAGNOSIS — D61818 Other pancytopenia: Secondary | ICD-10-CM | POA: Diagnosis not present

## 2023-11-07 DIAGNOSIS — K7682 Hepatic encephalopathy: Secondary | ICD-10-CM | POA: Diagnosis not present

## 2023-11-07 DIAGNOSIS — E782 Mixed hyperlipidemia: Secondary | ICD-10-CM | POA: Diagnosis not present

## 2023-11-07 LAB — GLUCOSE, CAPILLARY
Glucose-Capillary: 109 mg/dL — ABNORMAL HIGH (ref 70–99)
Glucose-Capillary: 141 mg/dL — ABNORMAL HIGH (ref 70–99)
Glucose-Capillary: 152 mg/dL — ABNORMAL HIGH (ref 70–99)
Glucose-Capillary: 126 mg/dL — ABNORMAL HIGH (ref 70–99)

## 2023-11-07 LAB — AMMONIA: Ammonia: 94 umol/L — ABNORMAL HIGH (ref 9–35)

## 2023-11-07 MED ORDER — VITAMIN B-1 100 MG PO TABS
100.0000 mg | ORAL_TABLET | Freq: Every day | ORAL | Status: DC
  Administered 2023-11-07 – 2023-11-10 (×4): 100 mg via ORAL
  Filled 2023-11-07 (×8): qty 1

## 2023-11-07 MED ORDER — FOLIC ACID 1 MG PO TABS
1.0000 mg | ORAL_TABLET | Freq: Every day | ORAL | Status: DC
  Administered 2023-11-07 – 2023-11-10 (×4): 1 mg via ORAL
  Filled 2023-11-07 (×4): qty 1

## 2023-11-07 NOTE — Progress Notes (Signed)
[facility]- Civil engineer, contracting   Received a referral to evaluate the [facility] for [facility].  Patient is not appropriate for [facility] to [facility] at this time. Spoke at length with patient's daughter, explaining the process, and answered questions that she had. Daughter would like for [facility] to provide palliative care follow up at the facility he discharges to. Hospital team aware.      Please don't hesitate to call with any Hospice related questions or concerns.    Thank you for the opportunity to participate in this patient's care. [facility] Liaison [phone]

## 2023-11-07 NOTE — Progress Notes (Addendum)
Physical Therapy Treatment Patient Details Name: Joshua Berry MRN: [id number] DOB: [birth year] Today's Date: 11/07/2023   History of Present Illness [name] is a 77 year old male with history of liver cirrhosis, hypertension, history of GI bleed, thrombocytopenia, insulin-dependent diabetes mellitus, GERD, who presents emergency department for chief concerns of altered mental status. MD assessment includes ICH, hepatic encephalopathy, hypothyroidism, hypomagnesemia, and physical deconditioning.    PT Comments  Pt in recliner on entry, appears quite lethargic, bradykinetic, almost like he's moving in slow motion. He is interactive but minimally, does not use more words than needed. Transfers remain similar to prior episodes, requires min to modA to come to standing; we explore alternative strategies for this to find most independent and ergonomic technique, requires less physical assistance when able to use both UE for pulling self to standing. Pt is hypotension on arrival and orthostatic with standing, did not obtain a 3 minute value. Pt partakes in resistance training with ankle weights with good tolerance overall, no pain, appears moderate or less loading based off limb rotational velocity. Pt set up for napping at end of session, somnolent throughout our encounter. Will continue to follow.   Orthostatic VS for the past 24 hrs:  BP- Lying Pulse- Lying BP- Sitting Pulse- Sitting BP- Standing at 0 minutes Pulse- Standing at 0 minutes  11/07/23 1527 105/56 57 109/62 59 94/55 69       If plan is discharge home, recommend the following: Assistance with cooking/housework;Assist for transportation;A lot of help with walking and/or transfers;A little help with bathing/dressing/bathroom   Can travel by private vehicle     No  Equipment Recommendations  None recommended by PT    Recommendations for Other Services       Precautions / Restrictions Precautions Precautions: Fall      Mobility  Bed Mobility                    Transfers Overall transfer level: Needs assistance Equipment used: Rolling walker (2 wheels) Transfers: Sit to/from Stand Sit to Stand: Min assist, Mod assist           General transfer comment: depends on height and hand placement    Ambulation/Gait                   Stairs             Wheelchair Mobility     Tilt Bed    Modified Rankin (Stroke Patients Only)       Balance                                            Cognition                                                Exercises General Exercises - Lower Extremity Long Arc Quad: AROM, Both, Seated, 10 reps, Other (comment), Weights (5lb AW bilat (cues fo rfull range, denies any knee pain)) Other Exercises Other Exercises: standing marching 1x16, alternate Berry 2 hands on RW); seated marchng x20, alternating Other Exercises: STS from recliner Berry modA,RW; STS from elevated recliner in push left 1 hand pull on bed rail (like grab bar) 3x cv minA Other Exercises: STS with 2  hands on anterior bed rail (elevated) minGuard assist x3    General Comments        Pertinent Vitals/Pain Pain Assessment Pain Assessment: No/denies pain    Home Living                          Prior Function            PT Goals (current goals can now be found in the care plan section) Acute Rehab PT Goals Patient Stated Goal: get stronger PT Goal Formulation: With patient Time For Goal Achievement: 11/21/23 Potential to Achieve Goals: Fair Progress towards PT goals: Not progressing toward goals - comment    Frequency    Min 1X/week      PT Plan      Co-evaluation              AM-PAC PT "6 Clicks" Mobility   Outcome Measure  Help needed turning from your back to your side while in a flat bed without using bedrails?: A Little Help needed moving from lying on your back to sitting on the side of a  flat bed without using bedrails?: A Little Help needed moving to and from a bed to a chair (including a wheelchair)?: A Little Help needed standing up from a chair using your arms (e.g., wheelchair or bedside chair)?: A Lot Help needed to walk in hospital room?: A Lot Help needed climbing 3-5 steps with a railing? : A Lot 6 Click Score: 15    End of Session   Activity Tolerance: Patient tolerated treatment well;Patient limited by fatigue Patient left: in chair;with chair alarm set;with call bell/phone within reach   PT Visit Diagnosis: Muscle weakness (generalized) (M62.81);Difficulty in walking, not elsewhere classified (R26.2);Unsteadiness on feet (R26.81);Other abnormalities of gait and mobility (R26.89)     Time: 1610-9604 PT Time Calculation (min) (ACUTE ONLY): 30 min  Charges:    $Therapeutic Exercise: 8-22 mins $Therapeutic Activity: 8-22 mins PT General Charges $$ ACUTE PT VISIT: 1 Visit                    4:05 PM, 11/07/23 Rosamaria Lints, PT, DPT Physical Therapist - [facility]  [phone] (ASCOM)    Joshua Berry 11/07/2023, 3:56 PM

## 2023-11-07 NOTE — Progress Notes (Signed)
   This pt was referred for hospice services at the [facility] facility in [facility]. We have reviewed the chart and discussed with our MD. The pt is not meeting General Inpatient criteria at this time. He has no unmanaged symptom needs and is not felt to be 2 weeks or less.   I have reached out to update the CM and family [name]. Unable to talk with [name] and have left Voicemail for her to return the call.  Norm Parcel RN [phone]

## 2023-11-07 NOTE — TOC Progression Note (Signed)
Transition of Care [facility]) - Progression Note    Patient Details  Name: Joshua Berry MRN: [id number] Date of Birth: 07/21/1946  Transition of [facility]) CM/SW Contact  Allena Katz, LCSW Phone Number: 11/07/2023, 4:28 PM  Clinical Narrative:  [facility] [place] denied. Cherie reports she is going to call daughter to let her know.      Expected Discharge Plan: Skilled Nursing Facility    Expected Discharge Plan and Services         Expected Discharge Date: 10/24/23                                     Social Determinants of Health (SDOH) Interventions SDOH Screenings   Food Insecurity: Patient Declined (10/17/2023)  Housing: Patient Declined (10/17/2023)  Transportation Needs: Patient Declined (10/17/2023)  Utilities: Patient Declined (10/17/2023)  Depression (PHQ2-9): Low Risk  (04/24/2023)  Financial Resource Strain: Low Risk  (08/06/2023)   Received from [facility]  Social Connections: Unknown (04/29/2022)   Received from [facility], [facility]  Tobacco Use: Medium Risk (10/17/2023)    Readmission Risk Interventions    09/15/2023    8:54 AM 08/14/2023    2:18 PM 07/15/2023    8:52 AM  Readmission Risk Prevention Plan  Transportation Screening Complete Complete Complete  PCP or Specialist Appt within 3-5 Days Complete Complete Complete  HRI or Home Care Consult Complete Complete   Social Work Consult for Recovery Care Planning/Counseling Not Complete Complete Complete  Palliative Care Screening Not Applicable Not Applicable Not Applicable  Medication Review Oceanographer) Not Complete Complete Referral to Pharmacy  Med Review Comments Patient confused and disoriented.

## 2023-11-07 NOTE — Care Management Important Message (Signed)
Important Message  Patient Details  Name: Joshua Berry MRN: [id number] Date of Birth: 06-09-1946   Important Message Given:  Yes - Medicare IM     [name] A [name] 11/07/2023, 2:26 PM

## 2023-11-07 NOTE — Progress Notes (Signed)
Son matt called and wants the director, [facility] and  dr.shah to call him [phone]. He states pt is not stable to dc and disagrees with plan of care. Irving Copas ADN and MD Shau via secure chat.

## 2023-11-07 NOTE — Progress Notes (Signed)
Progress Note   [name]:[id number] DOB: 07/20/1946 DOA: 10/16/2023     20 DOS: the patient was seen and examined on 11/07/2023   Brief hospital course: Joshua Berry is a 77 yo with h/o NASH cirrhosis, HTN, and DM who presented on 10/31 with AMS.  NH4 89, improving slowly with high doses of lactulose.  CT head showed mixed density subdural hematomas overlying bilateral cerebral hemispheres, stable since 09/15/2023; however there are hyperdense components within both subdural hematoma suggesting interval hemorrhage into these collections.  Repeat CT head after 6 hours did not show any changes.  Patient seen by neurosurgery, no need for surgery intervention.  His confusion resolved. Mental status much improved. Discharged on 11/8 to [facility] facility who did not accept him. TOC currently working on [place] placement.  11/21: Family requesting labs.  Potential discharge tomorrow to [facility] and rehab per [facility] 11/22: CT head is unchanged (performed last evening).  Ammonia 136> 94.  Family does not want him to go 2 hours away at [facility] and rehab.  He was declined for hospice home.  Assessment and Plan: AMS Hepatic encephalopathy Chronic SDH Patient presenting with encephalopathy as evidenced by transient confusion and word-finding difficulty. This appears to have been associated with hepatic encephalopathy, and chronic SDH may be contributing. Patient was admitted for ongoing evaluation and management. was treated for encephalopathy with lactulose titrated to 2-4 soft stools/day - currently on 45g q8h with hold parameters. Advised compliance. Continue Rifaximin. He was discharged 11/8 by [facility] facility unable to accept him. Patient is approved to go at [facility] and rehab.  Family is not in agreement as it is 1 to 2 hours away for them and they do not feel it is safe discharge Family also does not feel he is clinically (cognition) at his baseline to be able to go home or  discharge outside hospital yet They would like to monitor him here in the hospital until they are able to arrange for safe disposition for him.  Potential home with hospice if they can arrange for caregivers I will add thiamine and folic acid today   Cirrhosis MELD/MELD-Na score is 12/15, with a mortality rate of 6.6% Platelets stable.  Ammonia 136> 94 Continue Lasix, Rifaximin, Lactulose.   Intracranial hemorrhage Repeat CT of the head without contrast on 11/21 showed no new changes. No surgical intervention is needed per neurosurgery team.   Uncontrolled type 2 diabetes with hyperglycemia A1c 5.9, good control Continue Glucophage   GERD without esophagitis Continue Protonix   Hypothyroidism Continue levothyroxine   Hypomagnesemia Repleted   Out of bed to chair. Incentive spirometry. Nursing supportive care. Fall, aspiration precautions. DVT prophylaxis   Code Status: Limited: Do not attempt resuscitation (DNR) -DNR-LIMITED -Do Not Intubate/DNI   Subjective: He had a large bowel movement yesterday per nursing.  He is able to tell me that he is at [facility].  He does have poor p.o. intake  Physical Exam: Vitals:   11/06/23 1700 11/06/23 2038 11/07/23 0330 11/07/23 0922  BP: 120/65 (!) 111/57 (!) 120/55 (!) 110/50  Pulse: 60 61 (!) 56 70  Resp: 16  17 18   Temp: 98.3 F (36.8 C) 98.2 F (36.8 C) 98.3 F (36.8 C) 98.2 F (36.8 C)  TempSrc: Oral  Oral Oral  SpO2: 100% 100% 100%   Weight:      Height:        General - Elderly Caucasian male, no apparent distress HEENT - PERRLA,  EOMI, atraumatic head, non tender sinuses. Lung - Clear, diffuse rales, no rhonchi, wheezes. Heart - S1, S2 heard, no murmurs, rubs, trace pedal edema. Abdomen - Soft, non tender, bowel sounds good Neuro - Alert, awake and oriented x 2, non focal exam. Skin - Warm and dry.  Data Reviewed:      Latest Ref Rng & Units 11/06/2023   11:30 AM 10/26/2023    3:45 AM 10/24/2023     5:06 AM  CBC  WBC 4.0 - 10.5 K/uL 2.1  2.1  2.3   Hemoglobin 13.0 - 17.0 g/dL 9.9  9.1  9.7   Hematocrit 39.0 - 52.0 % 29.0  26.4  27.8   Platelets 150 - 400 K/uL 54  43  46       Latest Ref Rng & Units 11/06/2023   11:30 AM 10/31/2023    3:19 PM 10/26/2023    3:45 AM  BMP  Glucose 70 - 99 mg/dL 409  811  914   BUN 8 - 23 mg/dL 15  15  12    Creatinine 0.61 - 1.24 mg/dL 7.82  9.56  2.13   Sodium 135 - 145 mmol/L 139  138  138   Potassium 3.5 - 5.1 mmol/L 4.2  3.4  3.7   Chloride 98 - 111 mmol/L 109  110  108   CO2 22 - 32 mmol/L 22  20  24    Calcium 8.9 - 10.3 mg/dL 8.8  8.8  8.6    CT HEAD WO CONTRAST ( )  Result Date: 11/06/2023 CLINICAL DATA:  Mental status change, unknown cause EXAM: CT HEAD WITHOUT CONTRAST TECHNIQUE: Contiguous axial images were obtained from the base of the skull through the vertex without intravenous contrast. RADIATION DOSE REDUCTION: This exam was performed according to the departmental dose-optimization program which includes automated exposure control, adjustment of the mA and/or kV according to patient size and/or use of iterative reconstruction technique. COMPARISON:  Head CT 10/19/2023 FINDINGS: Brain: Postprocedural changes from bilateral burr hole craniotomies. Redemonstrated bilateral cerebral convexity mixed density subdural hematomas measuring up to 6 mm on the right and 3 mm on the left. The collection of the left has slightly decreased in size. Mass effect. No midline shift. No new sites of hemorrhage. No CT evidence of an acute cortical infarct. Vascular: No hyperdense vessel or unexpected calcification. Skull: Normal. Negative for fracture or focal lesion. Sinuses/Orbits: No middle ear or mastoid effusion. Paranasal sinuses are notable for mild mucosal thickening in the posterior ethmoid air cells on the left. Orbits are unremarkable. Other: None IMPRESSION: Redemonstrated bilateral cerebral convexity mixed density subdural hematomas measuring up to  6 mm on the right and 3 mm on the left. The collection of the left has slightly decreased in size. No new sites of hemorrhage. Electronically Signed   By: Lorenza Cambridge M.D.   On: 11/06/2023 16:14     Family Communication: Discussed with patient's son over phone.  Other family measures were on the speaker phone at the same time.  They do not feel he is safe for discharge.  They are not wanting him to go to [facility] and rehab due to distance.  He did not qualify for hospice home.  At this point possible option could be home with hospice if family is able to arrange for caregivers.  Family would like to keep him here till then.  They do not think he is cognitively at baseline yet  Disposition: Status is: Inpatient Remains inpatient appropriate because:  Waiting for ammonia and cognition to improve per family request  Planned Discharge Destination: Skilled nursing facility versus home with hospice     Time spent: 35 minutes  Author: Delfino Lovett, MD 11/07/2023 1:48 PM Secure chat 7am to 7pm For on call review [email or web address].

## 2023-11-08 DIAGNOSIS — K7682 Hepatic encephalopathy: Secondary | ICD-10-CM | POA: Diagnosis not present

## 2023-11-08 DIAGNOSIS — S065XAA Traumatic subdural hemorrhage with loss of consciousness status unknown, initial encounter: Secondary | ICD-10-CM | POA: Diagnosis not present

## 2023-11-08 DIAGNOSIS — D61818 Other pancytopenia: Secondary | ICD-10-CM | POA: Diagnosis not present

## 2023-11-08 DIAGNOSIS — E782 Mixed hyperlipidemia: Secondary | ICD-10-CM | POA: Diagnosis not present

## 2023-11-08 LAB — COMPREHENSIVE METABOLIC PANEL
ALT: 27 U/L (ref 0–44)
AST: 38 U/L (ref 15–41)
Albumin: 2.3 g/dL — ABNORMAL LOW (ref 3.5–5.0)
Alkaline Phosphatase: 97 U/L (ref 38–126)
Anion gap: 6 (ref 5–15)
BUN: 13 mg/dL (ref 8–23)
CO2: 22 mmol/L (ref 22–32)
Calcium: 8.4 mg/dL — ABNORMAL LOW (ref 8.9–10.3)
Chloride: 109 mmol/L (ref 98–111)
Creatinine, Ser: 0.92 mg/dL (ref 0.61–1.24)
GFR, Estimated: >60 mL/min (ref >60)
Glucose, Bld: 94 mg/dL (ref 70–99)
Potassium: 3.7 mmol/L (ref 3.5–5.1)
Sodium: 137 mmol/L (ref 135–145)
Total Bilirubin: 1.1 mg/dL (ref <1.2)
Total Protein: 5.2 g/dL — ABNORMAL LOW (ref 6.5–8.1)

## 2023-11-08 LAB — GLUCOSE, CAPILLARY
Glucose-Capillary: 84 mg/dL (ref 70–99)
Glucose-Capillary: 131 mg/dL — ABNORMAL HIGH (ref 70–99)
Glucose-Capillary: 162 mg/dL — ABNORMAL HIGH (ref 70–99)
Glucose-Capillary: 124 mg/dL — ABNORMAL HIGH (ref 70–99)

## 2023-11-08 LAB — CBC
HCT: 24.8 % — ABNORMAL LOW (ref 39.0–52.0)
Hemoglobin: 8.6 g/dL — ABNORMAL LOW (ref 13.0–17.0)
MCH: 32.1 pg (ref 26.0–34.0)
MCHC: 34.7 g/dL (ref 30.0–36.0)
MCV: 92.5 fL (ref 80.0–100.0)
Platelets: 41 10*3/uL — ABNORMAL LOW (ref 150–400)
RBC: 2.68 MIL/uL — ABNORMAL LOW (ref 4.22–5.81)
RDW: 16.1 % — ABNORMAL HIGH (ref 11.5–15.5)
WBC: 2 10*3/uL — ABNORMAL LOW (ref 4.0–10.5)
nRBC: 0 % (ref 0.0–0.2)

## 2023-11-08 LAB — AMMONIA: Ammonia: 89 umol/L — ABNORMAL HIGH (ref 9–35)

## 2023-11-08 NOTE — Plan of Care (Signed)
  Problem: Education: Goal: Ability to describe self-care measures that may prevent or decrease complications (Diabetes Survival Skills Education) will improve Outcome: Progressing   Problem: Coping: Goal: Ability to adjust to condition or change in health will improve Outcome: Progressing   Problem: Metabolic: Goal: Ability to maintain appropriate glucose levels will improve Outcome: Progressing   Problem: Tissue Perfusion: Goal: Adequacy of tissue perfusion will improve Outcome: Progressing

## 2023-11-08 NOTE — TOC Progression Note (Addendum)
Transition of Care [facility]) - Progression Note    Patient Details  Name: Joshua Berry MRN: [id number] Date of Birth: 1946/07/02  Transition of Care [facility]) CM/SW Contact  Rodney Langton, RN Phone Number: 11/08/2023, 11:55 AM  Clinical Narrative:     Called daughter [name] to discuss disposition since patient didn't qualify for hospice facility, no answer, voice message left.    Update @ 1205: Spoke with son, [name], they are concerned about patient still not being medically ready for discharge, concerned about his ammonia levels.  Daughter [name] has been working with PCP at the [place] in regards to getting patient eligible for hospice facility.  [name] state they will have more information on Monday in regards to disposition.   Expected Discharge Plan: Skilled Nursing Facility    Expected Discharge Plan and Services         Expected Discharge Date: 10/24/23                                     Social Determinants of Health (SDOH) Interventions SDOH Screenings   Food Insecurity: Patient Declined (10/17/2023)  Housing: Patient Declined (10/17/2023)  Transportation Needs: Patient Declined (10/17/2023)  Utilities: Patient Declined (10/17/2023)  Depression (PHQ2-9): Low Risk  (04/24/2023)  Financial Resource Strain: Low Risk  (08/06/2023)   Received from [facility]  Social Connections: Unknown (04/29/2022)   Received from [facility], [facility]  Tobacco Use: Medium Risk (10/17/2023)    Readmission Risk Interventions    09/15/2023    8:54 AM 08/14/2023    2:18 PM 07/15/2023    8:52 AM  Readmission Risk Prevention Plan  Transportation Screening Complete Complete Complete  PCP or Specialist Appt within 3-5 Days Complete Complete Complete  HRI or Home Care Consult Complete Complete   Social Work Consult for Recovery Care Planning/Counseling Not Complete Complete Complete  Palliative Care Screening Not Applicable Not Applicable Not Applicable  Medication Review Furniture conservator/restorer) Not Complete Complete Referral to Pharmacy  Med Review Comments Patient confused and disoriented.

## 2023-11-08 NOTE — Progress Notes (Deleted)
Pt activated Bed alarm at Approximately 0245. Several staff including attending RN raced to bedside to assist. Pt found with two hands on floor and two legs in bed, attempting to exit, pulling self slowly to floor. Pt assisted back to bed via 3 staff from a sitting position without incident. Pt examined as per policy. Assessment reveals no change in condition, Pt denies presence of pain/discomfort. Pt agitated on return to bed requiring antipsychotic. Pt states that he wants to leave and that he has "meds to deliver uptown" and that if staff prevent him from leaving to "take care of his business" he will return and "shoot" all of [place]. Provider Cliffton Asters, NP) contacted as per policy and reports to bedside. Database administrator at bedside, Consulting civil engineer Corrie Dandy) at bedside. Geodon administered and is effective. Follow up assessment and monitoring show Pt at baseline with no deficit or c/o pain/discomfort/disability.

## 2023-11-08 NOTE — Progress Notes (Signed)
Progress Note   [name] UKG:[id number] DOB: [birth year] DOA: 10/16/2023     21 DOS: the patient was seen and examined on 11/08/2023   Brief hospital course: Joshua Berry is a 77 yo with h/o NASH cirrhosis, HTN, and DM who presented on 10/31 with AMS.  NH4 89, improving slowly with high doses of lactulose.  CT head showed mixed density subdural hematomas overlying bilateral cerebral hemispheres, stable since 09/15/2023; however there are hyperdense components within both subdural hematoma suggesting interval hemorrhage into these collections.  Repeat CT head after 6 hours did not show any changes.  Patient seen by neurosurgery, no need for surgery intervention.  His confusion resolved. Mental status much improved. Discharged on 11/8 to [facility] facility who did not accept him. TOC currently working on [place] placement.  11/21: Family requesting labs.  Potential discharge tomorrow to [facility] and rehab per [facility] 11/22: CT head is unchanged (performed last evening).  Ammonia 136> 94.  Family does not want him to go 2 hours away at [facility] and rehab.  He was declined for hospice home. 11/23: Patient was eating breakfast by himself.  Ammonia 136> 94> 89.  Family still not ready for his discharge.  Requesting till Monday for possible home with hospice versus facility with hospice per son  Assessment and Plan: AMS Hepatic encephalopathy Chronic SDH Patient presenting with encephalopathy as evidenced by transient confusion and word-finding difficulty. This appears to have been associated with hepatic encephalopathy, and chronic SDH may be contributing. Patient was admitted for ongoing evaluation and management. was treated for encephalopathy with lactulose titrated to 2-4 soft stools/day - currently on 45g q8h with hold parameters. Advised compliance. Continue Rifaximin. He was discharged 11/8 by [facility] facility unable to accept him. Patient is approved to go at [facility] and rehab.   Family is not in agreement as it is 1 to 2 hours away for them and they do not feel it is safe discharge Family also does not feel he is clinically (cognition) at his baseline to be able to go home or discharge outside hospital yet. They would like to monitor him here in the hospital until they are able to arrange for safe disposition for him. Family Requesting till Monday for possible home with hospice versus facility with hospice per son.  They are working with his outpatient doctors for placement/hospice Continue thiamine and folic acid    Cirrhosis MELD/MELD-Na score is 12/15, with a mortality rate of 6.6% Platelets stable.  Ammonia 136> 94> 89 Continue Lasix, Rifaximin, Lactulose.,  MiraLAX   Intracranial hemorrhage Repeat CT of the head without contrast on 11/21 showed no new changes. No surgical intervention is needed per neurosurgery team.   Uncontrolled type 2 diabetes with hyperglycemia A1c 5.9, good control Continue Glucophage   GERD without esophagitis Continue Protonix   Hypothyroidism Continue levothyroxine   Hypomagnesemia Repleted   Out of bed to chair. Incentive spirometry. Nursing supportive care. Fall, aspiration precautions. DVT prophylaxis   Code Status: Limited: Do not attempt resuscitation (DNR) -DNR-LIMITED -Do Not Intubate/DNI   Subjective: He is eating breakfast by himself.  Seems close to his baseline  Physical Exam: Vitals:   11/07/23 1623 11/07/23 1941 11/08/23 0404 11/08/23 0830  BP: (!) 118/55 133/68 117/62 (!) 102/56  Pulse: 60 62 65 66  Resp: 18 18 16 16   Temp: 98.2 F (36.8 C) 98 F (36.7 C) 97.9 F (36.6 C) 97.8 F (36.6 C)  TempSrc:  Oral Oral Oral  SpO2: 100% 98% 96% 99%  Weight:      Height:        General - Elderly Caucasian male, no apparent distress HEENT - PERRLA, EOMI, atraumatic head, non tender sinuses. Lung - Clear, diffuse rales, no rhonchi, wheezes. Heart - S1, S2 heard, no murmurs, rubs, trace pedal  edema. Abdomen - Soft, non tender, bowel sounds good Neuro - Alert, awake and oriented x 2, non focal exam. Skin - Warm and dry.  Data Reviewed:      Latest Ref Rng & Units 11/08/2023    3:38 AM 11/06/2023   11:30 AM 10/26/2023    3:45 AM  CBC  WBC 4.0 - 10.5 K/uL 2.0  2.1  2.1   Hemoglobin 13.0 - 17.0 g/dL 8.6  9.9  9.1   Hematocrit 39.0 - 52.0 % 24.8  29.0  26.4   Platelets 150 - 400 K/uL 41  54  43       Latest Ref Rng & Units 11/08/2023    3:38 AM 11/06/2023   11:30 AM 10/31/2023    3:19 PM  BMP  Glucose 70 - 99 mg/dL 94  956  387   BUN 8 - 23 mg/dL 13  15  15    Creatinine 0.61 - 1.24 mg/dL 5.64  3.32  9.51   Sodium 135 - 145 mmol/L 137  139  138   Potassium 3.5 - 5.1 mmol/L 3.7  4.2  3.4   Chloride 98 - 111 mmol/L 109  109  110   CO2 22 - 32 mmol/L 22  22  20    Calcium 8.9 - 10.3 mg/dL 8.4  8.8  8.8    CT HEAD WO CONTRAST ( )  Result Date: 11/06/2023 CLINICAL DATA:  Mental status change, unknown cause EXAM: CT HEAD WITHOUT CONTRAST TECHNIQUE: Contiguous axial images were obtained from the base of the skull through the vertex without intravenous contrast. RADIATION DOSE REDUCTION: This exam was performed according to the departmental dose-optimization program which includes automated exposure control, adjustment of the mA and/or kV according to patient size and/or use of iterative reconstruction technique. COMPARISON:  Head CT 10/19/2023 FINDINGS: Brain: Postprocedural changes from bilateral burr hole craniotomies. Redemonstrated bilateral cerebral convexity mixed density subdural hematomas measuring up to 6 mm on the right and 3 mm on the left. The collection of the left has slightly decreased in size. Mass effect. No midline shift. No new sites of hemorrhage. No CT evidence of an acute cortical infarct. Vascular: No hyperdense vessel or unexpected calcification. Skull: Normal. Negative for fracture or focal lesion. Sinuses/Orbits: No middle ear or mastoid effusion.  Paranasal sinuses are notable for mild mucosal thickening in the posterior ethmoid air cells on the left. Orbits are unremarkable. Other: None IMPRESSION: Redemonstrated bilateral cerebral convexity mixed density subdural hematomas measuring up to 6 mm on the right and 3 mm on the left. The collection of the left has slightly decreased in size. No new sites of hemorrhage. Electronically Signed   By: Lorenza Cambridge M.D.   On: 11/06/2023 16:14     Family Communication: Discussed with patient's son over phone.  Other family measures were on the speaker phone at the same time.  They do not feel he is safe for discharge.  They are not wanting him to go to [facility] and rehab due to distance.  He did not qualify for hospice home.  At this point possible option could be home with hospice if family is able to arrange for  caregivers.  Family would like to keep him here till then.  They do not think he is cognitively at baseline yet  Disposition: Status is: Inpatient Remains inpatient appropriate because: Waiting for ammonia and cognition to improve per family request.  Family is trying to coordinate hospice at home versus hospice at the facility by her outpatient doctors and do not want him to be discharged to Monday  Planned Discharge Destination: Skilled nursing facility versus home with hospice     Time spent: 25 minutes  Author: Delfino Lovett, MD 11/08/2023 11:29 AM Secure chat 7am to 7pm For on call review [email or web address].

## 2023-11-09 DIAGNOSIS — K7682 Hepatic encephalopathy: Secondary | ICD-10-CM | POA: Diagnosis not present

## 2023-11-09 DIAGNOSIS — S065XAA Traumatic subdural hemorrhage with loss of consciousness status unknown, initial encounter: Secondary | ICD-10-CM | POA: Diagnosis not present

## 2023-11-09 DIAGNOSIS — D61818 Other pancytopenia: Secondary | ICD-10-CM | POA: Diagnosis not present

## 2023-11-09 DIAGNOSIS — E782 Mixed hyperlipidemia: Secondary | ICD-10-CM | POA: Diagnosis not present

## 2023-11-09 LAB — CBC
HCT: 24.8 % — ABNORMAL LOW (ref 39.0–52.0)
Hemoglobin: 8.8 g/dL — ABNORMAL LOW (ref 13.0–17.0)
MCH: 31.8 pg (ref 26.0–34.0)
MCHC: 35.5 g/dL (ref 30.0–36.0)
MCV: 89.5 fL (ref 80.0–100.0)
Platelets: 41 10*3/uL — ABNORMAL LOW (ref 150–400)
RBC: 2.77 MIL/uL — ABNORMAL LOW (ref 4.22–5.81)
RDW: 16 % — ABNORMAL HIGH (ref 11.5–15.5)
WBC: 1.9 10*3/uL — ABNORMAL LOW (ref 4.0–10.5)
nRBC: 0 % (ref 0.0–0.2)

## 2023-11-09 LAB — BASIC METABOLIC PANEL
Anion gap: 9 (ref 5–15)
BUN: 13 mg/dL (ref 8–23)
CO2: 23 mmol/L (ref 22–32)
Calcium: 8.3 mg/dL — ABNORMAL LOW (ref 8.9–10.3)
Chloride: 108 mmol/L (ref 98–111)
Creatinine, Ser: 0.82 mg/dL (ref 0.61–1.24)
GFR, Estimated: >60 mL/min (ref >60)
Glucose, Bld: 97 mg/dL (ref 70–99)
Potassium: 3.9 mmol/L (ref 3.5–5.1)
Sodium: 140 mmol/L (ref 135–145)

## 2023-11-09 LAB — GLUCOSE, CAPILLARY
Glucose-Capillary: 96 mg/dL (ref 70–99)
Glucose-Capillary: 147 mg/dL — ABNORMAL HIGH (ref 70–99)
Glucose-Capillary: 211 mg/dL — ABNORMAL HIGH (ref 70–99)
Glucose-Capillary: 141 mg/dL — ABNORMAL HIGH (ref 70–99)

## 2023-11-09 LAB — AMMONIA: Ammonia: 173 umol/L — ABNORMAL HIGH (ref 9–35)

## 2023-11-09 NOTE — Plan of Care (Signed)
Problem: Coping: Goal: Ability to adjust to condition or change in health will improve Outcome: Progressing   Problem: Fluid Volume: Goal: Ability to maintain a balanced intake and output will improve Outcome: Progressing   Problem: Metabolic: Goal: Ability to maintain appropriate glucose levels will improve Outcome: Progressing   Problem: Tissue Perfusion: Goal: Adequacy of tissue perfusion will improve Outcome: Progressing

## 2023-11-09 NOTE — Progress Notes (Addendum)
Progress Note   [name] QMV:[id number] DOB: [birth year] DOA: 10/16/2023     22 DOS: the patient was seen and examined on 11/09/2023   Brief hospital course: Joshua Berry is a 77 yo with h/o NASH cirrhosis, HTN, and DM who presented on 10/31 with AMS.  NH4 89, improving slowly with high doses of lactulose.  CT head showed mixed density subdural hematomas overlying bilateral cerebral hemispheres, stable since 09/15/2023; however there are hyperdense components within both subdural hematoma suggesting interval hemorrhage into these collections.  Repeat CT head after 6 hours did not show any changes.  Patient seen by neurosurgery, no need for surgery intervention.  His confusion resolved. Mental status much improved. Discharged on 11/8 to [facility] facility who did not accept him. TOC currently working on [place] placement.  11/21: Family requesting labs.  Potential discharge tomorrow to [facility] and rehab per [facility] 11/22: CT head is unchanged (performed last evening).  Ammonia 136> 94.  Family does not want him to go 2 hours away at [facility] and rehab.  He was declined for hospice home. 11/23: Patient was eating breakfast by himself.  Ammonia 136> 94> 89.  Family still not ready for his discharge.  Requesting till Monday for possible home with hospice versus facility with hospice per son 11/24: Ammonia up 173  Assessment and Plan: AMS Hepatic encephalopathy Chronic SDH Patient presenting with encephalopathy as evidenced by transient confusion and word-finding difficulty. This appears to have been associated with hepatic encephalopathy, and chronic SDH may be contributing. Patient was admitted for ongoing evaluation and management. was treated for encephalopathy with lactulose titrated to 2-4 soft stools/day - currently on 45g q8h with hold parameters. Advised compliance. Continue Rifaximin. He was discharged 11/8 by [facility] facility unable to accept him. Patient is approved to go at  [facility] and rehab.  Family is not in agreement as it is 1 to 2 hours away for them and they do not feel it is safe discharge Family also does not feel he is clinically (cognition) at his baseline to be able to go home or discharge outside hospital yet. They would like to monitor him here in the hospital until they are able to arrange for safe disposition for him. Family Requesting till Monday for possible home with hospice versus facility with hospice per son.  They are working with his outpatient doctors for placement/hospice Continue thiamine and folic acid    Cirrhosis MELD/MELD-Na score is 12/15, with a mortality rate of 6.6% Platelets stable.  Ammonia 136> 94> 89>173 Continue Lasix, Rifaximin, Lactulose.,  MiraLAX   Intracranial hemorrhage Repeat CT of the head without contrast on 11/21 showed no new changes. No surgical intervention is needed per neurosurgery team.   Uncontrolled type 2 diabetes with hyperglycemia A1c 5.9, good control Continue Glucophage   GERD without esophagitis Continue Protonix   Hypothyroidism Continue levothyroxine   Hypomagnesemia Repleted   Out of bed to chair. Incentive spirometry. Nursing supportive care. Fall, aspiration precautions. DVT prophylaxis   Code Status: Limited: Do not attempt resuscitation (DNR) -DNR-LIMITED -Do Not Intubate/DNI   Subjective: He ate some breakfast. No other issues  Physical Exam: Vitals:   11/08/23 2127 11/08/23 2132 11/09/23 0559 11/09/23 0752  BP: (!) 93/54 (!) 93/54 (!) 102/55 113/65  Pulse: (!) 57 (!) 59 61 (!) 59  Resp:  18 16 16   Temp: 98.1 F (36.7 C) 98.1 F (36.7 C) 98 F (36.7 C) 98 F (36.7 C)  TempSrc: Oral Oral  SpO2:  100% 100% 100%  Weight:      Height:        General - Elderly Caucasian male, no apparent distress HEENT - PERRLA, EOMI, atraumatic head, non tender sinuses. Lung - Clear, diffuse rales, no rhonchi, wheezes. Heart - S1, S2 heard, no murmurs, rubs, trace pedal  edema. Abdomen - Soft, non tender, bowel sounds good Neuro - Alert, awake and oriented x 2, non focal exam. Skin - Warm and dry.  Data Reviewed:      Latest Ref Rng & Units 11/09/2023    3:57 AM 11/08/2023    3:38 AM 11/06/2023   11:30 AM  CBC  WBC 4.0 - 10.5 K/uL 1.9  2.0  2.1   Hemoglobin 13.0 - 17.0 g/dL 8.8  8.6  9.9   Hematocrit 39.0 - 52.0 % 24.8  24.8  29.0   Platelets 150 - 400 K/uL 41  41  54       Latest Ref Rng & Units 11/09/2023    3:57 AM 11/08/2023    3:38 AM 11/06/2023   11:30 AM  BMP  Glucose 70 - 99 mg/dL 97  94  161   BUN 8 - 23 mg/dL 13  13  15    Creatinine 0.61 - 1.24 mg/dL 0.96  0.45  4.09   Sodium 135 - 145 mmol/L 140  137  139   Potassium 3.5 - 5.1 mmol/L 3.9  3.7  4.2   Chloride 98 - 111 mmol/L 108  109  109   CO2 22 - 32 mmol/L 23  22  22    Calcium 8.9 - 10.3 mg/dL 8.3  8.4  8.8    No results found.   Family Communication: Called his son but he did not pick up  Disposition: Status is: Inpatient Remains inpatient appropriate because: Waiting for ammonia and cognition to improve per family request.  Family is trying to coordinate hospice at home versus hospice at the facility by her outpatient doctors and do not want him to be discharged to Monday  Planned Discharge Destination: Skilled nursing facility versus home with hospice     Time spent: 25 minutes  Author: Delfino Lovett, MD 11/09/2023 12:14 PM Secure chat 7am to 7pm For on call review [email or web address].

## 2023-11-10 DIAGNOSIS — D61818 Other pancytopenia: Secondary | ICD-10-CM | POA: Diagnosis not present

## 2023-11-10 DIAGNOSIS — S065XAA Traumatic subdural hemorrhage with loss of consciousness status unknown, initial encounter: Secondary | ICD-10-CM | POA: Diagnosis not present

## 2023-11-10 DIAGNOSIS — Z7189 Other specified counseling: Secondary | ICD-10-CM

## 2023-11-10 DIAGNOSIS — K7682 Hepatic encephalopathy: Secondary | ICD-10-CM | POA: Diagnosis not present

## 2023-11-10 DIAGNOSIS — E782 Mixed hyperlipidemia: Secondary | ICD-10-CM | POA: Diagnosis not present

## 2023-11-10 LAB — COMPREHENSIVE METABOLIC PANEL
ALT: 28 U/L (ref 0–44)
AST: 44 U/L — ABNORMAL HIGH (ref 15–41)
Albumin: 2.4 g/dL — ABNORMAL LOW (ref 3.5–5.0)
Alkaline Phosphatase: 97 U/L (ref 38–126)
Anion gap: 8 (ref 5–15)
BUN: 13 mg/dL (ref 8–23)
CO2: 21 mmol/L — ABNORMAL LOW (ref 22–32)
Calcium: 8.6 mg/dL — ABNORMAL LOW (ref 8.9–10.3)
Chloride: 109 mmol/L (ref 98–111)
Creatinine, Ser: 0.86 mg/dL (ref 0.61–1.24)
GFR, Estimated: >60 mL/min (ref >60)
Glucose, Bld: 110 mg/dL — ABNORMAL HIGH (ref 70–99)
Potassium: 3.6 mmol/L (ref 3.5–5.1)
Sodium: 138 mmol/L (ref 135–145)
Total Bilirubin: 1.3 mg/dL — ABNORMAL HIGH (ref <1.2)
Total Protein: 5.5 g/dL — ABNORMAL LOW (ref 6.5–8.1)

## 2023-11-10 LAB — CBC
HCT: 26.9 % — ABNORMAL LOW (ref 39.0–52.0)
Hemoglobin: 9.3 g/dL — ABNORMAL LOW (ref 13.0–17.0)
MCH: 31.5 pg (ref 26.0–34.0)
MCHC: 34.6 g/dL (ref 30.0–36.0)
MCV: 91.2 fL (ref 80.0–100.0)
Platelets: 42 10*3/uL — ABNORMAL LOW (ref 150–400)
RBC: 2.95 MIL/uL — ABNORMAL LOW (ref 4.22–5.81)
RDW: 15.9 % — ABNORMAL HIGH (ref 11.5–15.5)
WBC: 2.4 10*3/uL — ABNORMAL LOW (ref 4.0–10.5)
nRBC: 0 % (ref 0.0–0.2)

## 2023-11-10 LAB — AMMONIA: Ammonia: 97 umol/L — ABNORMAL HIGH (ref 9–35)

## 2023-11-10 LAB — GLUCOSE, CAPILLARY
Glucose-Capillary: 109 mg/dL — ABNORMAL HIGH (ref 70–99)
Glucose-Capillary: 127 mg/dL — ABNORMAL HIGH (ref 70–99)
Glucose-Capillary: 166 mg/dL — ABNORMAL HIGH (ref 70–99)

## 2023-11-10 MED ORDER — GLYCOPYRROLATE 0.2 MG/ML IJ SOLN: 0.2000 mg | INTRAMUSCULAR | Status: DC | PRN

## 2023-11-10 MED ORDER — POLYVINYL ALCOHOL 1.4 % OP SOLN: 1.0000 [drp] | Freq: Four times a day (QID) | OPHTHALMIC | Status: DC | PRN

## 2023-11-10 MED ORDER — GLYCOPYRROLATE 1 MG PO TABS: 1.0000 mg | ORAL_TABLET | ORAL | Status: DC | PRN

## 2023-11-10 MED ORDER — ONDANSETRON HCL 4 MG/2ML IJ SOLN: 4.0000 mg | Freq: Four times a day (QID) | INTRAMUSCULAR | Status: DC | PRN

## 2023-11-10 MED ORDER — DIPHENHYDRAMINE HCL 50 MG/ML IJ SOLN: 25.0000 mg | INTRAMUSCULAR | Status: DC | PRN

## 2023-11-10 MED ORDER — MORPHINE SULFATE (PF) 2 MG/ML IV SOLN
2.0000 mg | INTRAVENOUS | Status: DC | PRN
  Filled 2023-11-10: qty 2

## 2023-11-10 MED ORDER — TERAZOSIN HCL 1 MG PO CAPS
1.0000 mg | ORAL_CAPSULE | Freq: Every day | ORAL | Status: DC
  Administered 2023-11-11 – 2023-11-23 (×13): 1 mg via ORAL
  Filled 2023-11-10 (×14): qty 1

## 2023-11-10 MED ORDER — SODIUM CHLORIDE 0.9% FLUSH
10.0000 mL | Freq: Two times a day (BID) | INTRAVENOUS | Status: DC
  Administered 2023-11-10 – 2023-11-24 (×27): 10 mL via INTRAVENOUS

## 2023-11-10 MED ORDER — HALOPERIDOL LACTATE 5 MG/ML IJ SOLN: 2.5000 mg | INTRAMUSCULAR | Status: DC | PRN

## 2023-11-10 MED ORDER — ACETAMINOPHEN 325 MG PO TABS: 650.0000 mg | ORAL_TABLET | Freq: Four times a day (QID) | ORAL | Status: DC | PRN

## 2023-11-10 MED ORDER — ACETAMINOPHEN 650 MG RE SUPP: 650.0000 mg | Freq: Four times a day (QID) | RECTAL | Status: DC | PRN

## 2023-11-10 MED ORDER — ONDANSETRON 4 MG PO TBDP: 4.0000 mg | ORAL_TABLET | Freq: Four times a day (QID) | ORAL | Status: DC | PRN

## 2023-11-10 MED ORDER — LORAZEPAM 2 MG/ML PO CONC: 1.0000 mg | ORAL | Status: DC | PRN

## 2023-11-10 NOTE — Progress Notes (Signed)
   11/10/23 1200  Spiritual Encounters  Type of Visit Initial  Care provided to: Pt not available  Spiritual Care Plan  Spiritual Care Issues Still Outstanding Chaplain will continue to follow   Visit patient room twice patient was sleeping once and eating the second time I tried to visit. Will revisit patient's room later on  today this is for an EOL.

## 2023-11-10 NOTE — TOC Progression Note (Signed)
Transition of Care [facility]) - Progression Note    Patient Details  Name: Joshua Berry MRN: [id number] Date of Birth: Nov 22, 1946  Transition of Care [facility]) CM/SW Contact  Allena Katz, LCSW Phone Number: 11/10/2023, 11:27 AM  Clinical Narrative:   CSW spoke with [place] [facility] who reported pt's auth expires on dec 7th and that if we need a new one new clinicals would have to be faxed to [phone] and if patient needs LTC hospice it needs to be faxed to [phone] to Dr. Margaretann Loveless.    Expected Discharge Plan: Skilled Nursing Facility    Expected Discharge Plan and Services         Expected Discharge Date: 10/24/23                                     Social Determinants of Health (SDOH) Interventions SDOH Screenings   Food Insecurity: Patient Declined (10/17/2023)  Housing: Patient Declined (10/17/2023)  Transportation Needs: Patient Declined (10/17/2023)  Utilities: Patient Declined (10/17/2023)  Depression (PHQ2-9): Low Risk  (04/24/2023)  Financial Resource Strain: Low Risk  (08/06/2023)   Received from [name]  Social Connections: Unknown (04/29/2022)   Received from [facility]  Tobacco Use: Medium Risk (10/17/2023)    Readmission Risk Interventions    09/15/2023    8:54 AM 08/14/2023    2:18 PM 07/15/2023    8:52 AM  Readmission Risk Prevention Plan  Transportation Screening Complete Complete Complete  PCP or Specialist Appt within 3-5 Days Complete Complete Complete  HRI or Home Care Consult Complete Complete   Social Work Consult for Recovery Care Planning/Counseling Not Complete Complete Complete  Palliative Care Screening Not Applicable Not Applicable Not Applicable  Medication Review Oceanographer) Not Complete Complete Referral to Pharmacy  Med Review Comments Patient confused and disoriented.

## 2023-11-10 NOTE — TOC Progression Note (Signed)
Transition of Care [facility]) - Progression Note    Patient Details  Name: Joshua Berry MRN: [id number] Date of Birth: [birth year]  Transition [facility]) CM/SW Contact  Allena Katz, LCSW Phone Number: 11/10/2023, 11:01 AM  Clinical Narrative:   Pt now on comfort care. Family wants to be evaluated by hospice house. [facility] to evaluate.    Expected Discharge Plan: Skilled Nursing Facility    Expected Discharge Plan and Services         Expected Discharge Date: 10/24/23                                     Social Determinants of Health (SDOH) Interventions SDOH Screenings   Food Insecurity: Patient Declined (10/17/2023)  Housing: Patient Declined (10/17/2023)  Transportation Needs: Patient Declined (10/17/2023)  Utilities: Patient Declined (10/17/2023)  Depression (PHQ2-9): Low Risk  (04/24/2023)  Financial Resource Strain: Low Risk  (08/06/2023)   Received from [facility]  Social Connections: Unknown (04/29/2022)   [facility] from [facility]  Tobacco Use: Medium Risk (10/17/2023)    Readmission Risk Interventions    09/15/2023    8:54 AM 08/14/2023    2:18 PM 07/15/2023    8:52 AM  Readmission Risk Prevention Plan  Transportation Screening Complete Complete Complete  PCP or Specialist Appt within 3-5 Days Complete Complete Complete  HRI or Home Care Consult Complete Complete   Social Work Consult for Recovery Care Planning/Counseling Not Complete Complete Complete  Palliative Care Screening Not Applicable Not Applicable Not Applicable  Medication Review Oceanographer) Not Complete Complete Referral to Pharmacy  Med Review Comments Patient confused and disoriented.

## 2023-11-10 NOTE — IPAL (Signed)
  Interdisciplinary Goals of Care Family Meeting   Date carried out: 11/10/2023  Location of the meeting: Phone conference  Member's involved: Physician and Family Member or next of kin  Durable Power of Attorney or acting medical decision maker: [name]/daughter  Discussion: We discussed goals of care for [employer]   Code status:   Code Status: Do not attempt resuscitation (DNR) - Comfort care   Disposition: In-patient comfort care versus hospice home if he qualifies.  Time spent for the meeting: 35 minutes    Delfino Lovett, MD  11/10/2023, 11:07 AM

## 2023-11-10 NOTE — Progress Notes (Signed)
Progress Note   [name] DOB: [birth year] DOA: 10/16/2023     23 DOS: the patient was seen and examined on 11/10/2023   Brief hospital course: Joshua Berry is a 77 yo with h/o NASH cirrhosis, HTN, and DM who presented on 10/31 with AMS.  NH4 89, improving slowly with high doses of lactulose.  CT head showed mixed density subdural hematomas overlying bilateral cerebral hemispheres, stable since 09/15/2023; however there are hyperdense components within both subdural hematoma suggesting interval hemorrhage into these collections.  Repeat CT head after 6 hours did not show any changes.  Patient seen by neurosurgery, no need for surgery intervention.  His confusion resolved. Mental status much improved. Discharged on 11/8 to [facility] facility who did not accept him. TOC currently working on [place] placement.  11/21: Family requesting labs.  Potential discharge tomorrow to [facility] and rehab per [facility] 11/22: CT head is unchanged (performed last evening).  Ammonia 136> 94.  Family does not want him to go 2 hours away at [facility] and rehab.  He was declined for hospice home. 11/23: Patient was eating breakfast by himself.  Ammonia 136> 94> 89.  Family still not ready for his discharge.  Requesting till Monday for possible home with hospice versus facility with hospice per son 11/24: Ammonia up 173 11/25: HCPOA and rest of the family agreed for full comfort care for now    Assessment and Plan: AMS Hepatic encephalopathy Chronic SDH Cirrhosis Intracranial hemorrhage Uncontrolled type 2 diabetes with hyperglycemia GERD without esophagitis Hypothyroidism Hypomagnesemia  Daughter would like for him to continue having terazosin for now due to prostate issues  Goals of care -family opted for full comfort care only for now.  They are hoping to see if he would qualify for hospice home where they would like him to go  DVT prophylaxis   Code Status: Do not attempt  resuscitation (DNR) - Comfort care  Subjective: Remains about the same.  No new issues.  Did not eat much breakfast today  Physical Exam: Vitals:   11/09/23 1938 11/09/23 2315 11/10/23 0409 11/10/23 0757  BP: (!) 121/53 103/63 (!) 113/59 (!) 116/58  Pulse: 61  61 64  Resp: 17  18 14   Temp: 97.8 F (36.6 C)  98 F (36.7 C) 97.8 F (36.6 C)  TempSrc:      SpO2: 100%  100% 100%  Weight:      Height:        General - Elderly Caucasian male, no apparent distress HEENT - PERRLA, EOMI, atraumatic head, non tender sinuses. Lung - Clear, diffuse rales, no rhonchi, wheezes. Heart - S1, S2 heard, no murmurs, rubs, trace pedal edema. Abdomen - Soft, non tender, bowel sounds good Neuro - Alert, awake and oriented x 2, non focal exam. Skin - Warm and dry.  Data Reviewed:      Latest Ref Rng & Units 11/10/2023    5:55 AM 11/09/2023    3:57 AM 11/08/2023    3:38 AM  CBC  WBC 4.0 - 10.5 K/uL 2.4  1.9  2.0   Hemoglobin 13.0 - 17.0 g/dL 9.3  8.8  8.6   Hematocrit 39.0 - 52.0 % 26.9  24.8  24.8   Platelets 150 - 400 K/uL 42  41  41       Latest Ref Rng & Units 11/10/2023    5:55 AM 11/09/2023    3:57 AM 11/08/2023    3:38 AM  BMP  Glucose  70 - 99 mg/dL 865  97  94   BUN 8 - 23 mg/dL 13  13  13    Creatinine 0.61 - 1.24 mg/dL 7.84  6.96  2.95   Sodium 135 - 145 mmol/L 138  140  137   Potassium 3.5 - 5.1 mmol/L 3.6  3.9  3.7   Chloride 98 - 111 mmol/L 109  108  109   CO2 22 - 32 mmol/L 21  23  22    Calcium 8.9 - 10.3 mg/dL 8.6  8.3  8.4    No results found.   Family Communication: Updated daughter Joshua Berry over phone.  She is requesting full comfort care  Disposition: Status is: Inpatient Remains inpatient appropriate because: Waiting for hospice home evaluation  Planned Discharge Destination: Full comfort care/inpatient comfort care versus hospice home if he qualifies     Time spent: 25 minutes  Author: Delfino Lovett, MD 11/10/2023 11:10 AM Secure chat 7am to 7pm For on  call review [email or web address].

## 2023-11-10 NOTE — Progress Notes (Signed)
PT Cancellation Note  Patient Details Name: Joshua Berry MRN: [id number] DOB: [birth year]   Cancelled Treatment:    Reason Eval/Treat Not Completed: Other (comment): Patient in bed upon arrival, awaken to therapist voice. Pt refusing therapy session this date, endorsing headache. Attempted bed level activity but patient also refused despite max encouragement. Will re-attempt at later date/time as appropriate.    Howie Ill, PT, DPT 11/10/23 10:42 AM

## 2023-11-10 NOTE — Progress Notes (Signed)
[facility] Liaison Note  Follow up on new InPatient Unit [facility]) hospice referral received last week and patient found to not meet criteria for IPU.  Received a request from Dr. Sherryll Burger and Allena Katz, TOC, to re-evaluate patient for IPU now that family has chosen full comfort measures.  Patient sitting up in bed eating upon my visit to room.  No family was at the bedside.  Patient with no acute symptom management needs at this time.  Per Dr. Alphonsus Sias, hospice MD, patient does not meet criteria for IPU.    Dr. Sherryll Burger and Allena Katz, [facility] along with medical team notified of above information.  Hospital liaison team will continue to follow through final disposition and collaborate with [facility] for any hospice related needs.  Thank you for allowing participation in this patient's care.  Norris Cross, RN Nurse Liaison [phone]

## 2023-11-10 NOTE — Progress Notes (Signed)
Mobility Specialist - Progress Note   11/10/23 1559  Mobility  Activity Refused mobility   Pt refused OOB amb and transfer to the recliner this date. MS unable to gather reason for refusal.   Zetta Bills Mobility Specialist 11/10/23 4:00 PM

## 2023-11-11 DIAGNOSIS — Z7189 Other specified counseling: Secondary | ICD-10-CM | POA: Diagnosis not present

## 2023-11-11 DIAGNOSIS — K7682 Hepatic encephalopathy: Secondary | ICD-10-CM | POA: Diagnosis not present

## 2023-11-11 DIAGNOSIS — K219 Gastro-esophageal reflux disease without esophagitis: Secondary | ICD-10-CM

## 2023-11-11 DIAGNOSIS — S065XAA Traumatic subdural hemorrhage with loss of consciousness status unknown, initial encounter: Secondary | ICD-10-CM | POA: Diagnosis not present

## 2023-11-11 LAB — GLUCOSE, CAPILLARY
Glucose-Capillary: 121 mg/dL — ABNORMAL HIGH (ref 70–99)
Glucose-Capillary: 145 mg/dL — ABNORMAL HIGH (ref 70–99)

## 2023-11-11 NOTE — Progress Notes (Addendum)
[facility] Liaison team will follow peripherally until discharge.  [facility] will follow patient at a long term care facility.     Please don't hesitate to call with any Palliative Care or Hospice related questions or concerns.    Thank you for the opportunity to participate in this patient's care. [facility] Liaison [phone]

## 2023-11-11 NOTE — TOC Progression Note (Signed)
Transition of Care [facility]) - Progression Note    Patient Details  Name: Joshua Berry MRN: [id number] Date of Birth: July 05, 1946  Transition of Care [facility]) CM/SW Contact  Allena Katz, LCSW Phone Number: 11/11/2023, 11:59 AM  Clinical Narrative:   CSW spoke with daughter to explain that pt does not qualify for hospice house. Daughter would like hospice to help her understand why that is. Message relayed to Arita Miss.    Expected Discharge Plan: Skilled Nursing Facility    Expected Discharge Plan and Services         Expected Discharge Date: 10/24/23                                     Social Determinants of Health (SDOH) Interventions SDOH Screenings   Food Insecurity: Patient Declined (10/17/2023)  Housing: Patient Declined (10/17/2023)  Transportation Needs: Patient Declined (10/17/2023)  Utilities: Patient Declined (10/17/2023)  Depression (PHQ2-9): Low Risk  (04/24/2023)  Financial Resource Strain: Low Risk  (08/06/2023)   Received from [facility]  Social Connections: Unknown (04/29/2022)   Received from [facility]  Tobacco Use: Medium Risk (10/17/2023)    Readmission Risk Interventions    09/15/2023    8:54 AM 08/14/2023    2:18 PM 07/15/2023    8:52 AM  Readmission Risk Prevention Plan  Transportation Screening Complete Complete Complete  PCP or Specialist Appt within 3-5 Days Complete Complete Complete  HRI or Home Care Consult Complete Complete   Social Work Consult for Recovery Care Planning/Counseling Not Complete Complete Complete  Palliative Care Screening Not Applicable Not Applicable Not Applicable  Medication Review Oceanographer) Not Complete Complete Referral to Pharmacy  Med Review Comments Patient confused and disoriented.

## 2023-11-11 NOTE — Progress Notes (Signed)
Progress Note   [name]:[id number] DOB: [birth year] DOA: 10/16/2023     24 DOS: the patient was seen and examined on 11/11/2023   Brief hospital course: Joshua Berry is a 77 yo with h/o NASH cirrhosis, HTN, and DM who presented on 10/31 with AMS.  NH4 89, improving slowly with high doses of lactulose.  CT head showed mixed density subdural hematomas overlying bilateral cerebral hemispheres, stable since 09/15/2023; however there are hyperdense components within both subdural hematoma suggesting interval hemorrhage into these collections.  Repeat CT head after 6 hours did not show any changes.  Patient seen by neurosurgery, no need for surgery intervention.  His confusion resolved. Mental status much improved. Discharged on 11/8 to [facility] facility who did not accept him. TOC currently working on [place] placement.  11/21: Family requesting labs.  Potential discharge tomorrow to [facility] and rehab per [facility] 11/22: CT head is unchanged (performed last evening).  Ammonia 136> 94.  Family does not want him to go 2 hours away at [facility] and rehab.  He was declined for hospice home. 11/23: Patient was eating breakfast by himself.  Ammonia 136> 94> 89.  Family still not ready for his discharge.  Requesting till Monday for possible home with hospice versus facility with hospice per son 11/24: Ammonia up 173 11/25: HCPOA and rest of the family agreed for full comfort care for now 11/26: Full comfort care.  Family does not want him to leave the hospital   Assessment and Plan: AMS Hepatic encephalopathy Chronic SDH Cirrhosis Intracranial hemorrhage Uncontrolled type 2 diabetes with hyperglycemia GERD without esophagitis Hypothyroidism Hypomagnesemia  Daughter would like for him to continue having terazosin for now due to prostate issues  Goals of care -full comfort care.  Family shares that he cannot go anywhere as he does not have home and another family is all set up to  take care of him at their home  DVT prophylaxis   Code Status: Do not attempt resuscitation (DNR) - Comfort care  Subjective: Seems comfortable.  Daughter at bedside  Physical Exam: Vitals:   11/10/23 0757 11/10/23 1631 11/10/23 2010 11/11/23 0806  BP: (!) 116/58 122/73 (!) 117/57 119/70  Pulse: 64 84 64 65  Resp: 14   16  Temp: 97.8 F (36.6 C)  98.9 F (37.2 C) 98.6 F (37 C)  TempSrc:      SpO2: 100% 100% 99% 100%  Weight:      Height:        General - Elderly Caucasian male, no apparent distress HEENT - PERRLA, EOMI, atraumatic head, non tender sinuses. Lung - Clear, diffuse rales, no rhonchi, wheezes. Heart - S1, S2 heard, no murmurs, rubs, trace pedal edema. Abdomen - Soft, benign Neuro -obtunded, non focal exam. Skin - Warm and dry.  Data Reviewed:      Latest Ref Rng & Units 11/10/2023    5:55 AM 11/09/2023    3:57 AM 11/08/2023    3:38 AM  CBC  WBC 4.0 - 10.5 K/uL 2.4  1.9  2.0   Hemoglobin 13.0 - 17.0 g/dL 9.3  8.8  8.6   Hematocrit 39.0 - 52.0 % 26.9  24.8  24.8   Platelets 150 - 400 K/uL 42  41  41       Latest Ref Rng & Units 11/10/2023    5:55 AM 11/09/2023    3:57 AM 11/08/2023    3:38 AM  BMP  Glucose 70 - 99 mg/dL  110  97  94   BUN 8 - 23 mg/dL 13  13  13    Creatinine 0.61 - 1.24 mg/dL 0.10  2.72  5.36   Sodium 135 - 145 mmol/L 138  140  137   Potassium 3.5 - 5.1 mmol/L 3.6  3.9  3.7   Chloride 98 - 111 mmol/L 109  108  109   CO2 22 - 32 mmol/L 21  23  22    Calcium 8.9 - 10.3 mg/dL 8.6  8.3  8.4    No results found.   Family Communication: Updated daughter [name] at bedside.  She shared the have no place for him outside the hospital unless any inpatient hospice/hospice home can take him  Disposition: Status is: Inpatient Remains inpatient appropriate because: Inpatient comfort care  Planned Discharge Destination: Full comfort care/inpatient comfort care versus hospice home if he qualifies     Time spent: 25  minutes  Author: Delfino Lovett, MD 11/11/2023 12:07 PM Secure chat 7am to 7pm For on call review [email or web address].

## 2023-11-12 DIAGNOSIS — S065XAA Traumatic subdural hemorrhage with loss of consciousness status unknown, initial encounter: Secondary | ICD-10-CM | POA: Diagnosis not present

## 2023-11-12 NOTE — Plan of Care (Signed)
All education presented to family at bedside.    Problem: Education: Goal: Ability to describe self-care measures that may prevent or decrease complications (Diabetes Survival Skills Education) will improve Outcome: Progressing   Problem: Coping: Goal: Ability to adjust to condition or change in health will improve Outcome: Progressing   Problem: Fluid Volume: Goal: Ability to maintain a balanced intake and output will improve Outcome: Progressing   Problem: Health Behavior/Discharge Planning: Goal: Ability to identify and utilize available resources and services will improve Outcome: Progressing Goal: Ability to manage health-related needs will improve Outcome: Progressing   Problem: Metabolic: Goal: Ability to maintain appropriate glucose levels will improve Outcome: Progressing   Problem: Nutritional: Goal: Maintenance of adequate nutrition will improve Outcome: Progressing Goal: Progress toward achieving an optimal weight will improve Outcome: Progressing   Problem: Skin Integrity: Goal: Risk for impaired skin integrity will decrease Outcome: Progressing   Problem: Tissue Perfusion: Goal: Adequacy of tissue perfusion will improve Outcome: Progressing   Problem: Education: Goal: Knowledge of General Education information will improve Description: Including pain rating scale, medication(s)/side effects and non-pharmacologic comfort measures Outcome: Progressing   Problem: Health Behavior/Discharge Planning: Goal: Ability to manage health-related needs will improve Outcome: Progressing   Problem: Clinical Measurements: Goal: Ability to maintain clinical measurements within normal limits will improve Outcome: Progressing Goal: Will remain free from infection Outcome: Progressing Goal: Diagnostic test results will improve Outcome: Progressing Goal: Respiratory complications will improve Outcome: Progressing Goal: Cardiovascular complication will be  avoided Outcome: Progressing   Problem: Activity: Goal: Risk for activity intolerance will decrease Outcome: Progressing   Problem: Nutrition: Goal: Adequate nutrition will be maintained Outcome: Progressing   Problem: Coping: Goal: Level of anxiety will decrease Outcome: Progressing   Problem: Elimination: Goal: Will not experience complications related to bowel motility Outcome: Progressing Goal: Will not experience complications related to urinary retention Outcome: Progressing   Problem: Pain Management: Goal: General experience of comfort will improve Outcome: Progressing   Problem: Safety: Goal: Ability to remain free from injury will improve Outcome: Progressing   Problem: Skin Integrity: Goal: Risk for impaired skin integrity will decrease Outcome: Progressing

## 2023-11-12 NOTE — Progress Notes (Signed)
[facility]  - [place] at [facility]   PATIENT NAME: Joshua Berry    MR#:  [id number]  DATE OF BIRTH:  06/03/1946  SUBJECTIVE:  daughter and wife at bedside. Patient did eat some oatmeal this morning. Awake.    VITALS:  Blood pressure (!) 111/56, pulse 60, temperature 98.4 F (36.9 C), temperature source Oral, resp. rate 16, height 6\' 2"  (1.88 m), weight 97.4 kg, SpO2 98%.  PHYSICAL EXAMINATION:  limited patient is comfort care GENERAL:  77 y.o.-year-old patient with no acute distress.  LUNGS: Normal breath sounds bilaterally CARDIOVASCULAR: S1, S2 normal. No murmur   NEUROLOGIC:  patient is alert   LABORATORY PANEL:  CBC Recent Labs  Lab 11/10/23 0555  WBC 2.4*  HGB 9.3*  HCT 26.9*  PLT 42*    Chemistries  Recent Labs  Lab 11/10/23 0555  NA 138  K 3.6  CL 109  CO2 21*  GLUCOSE 110*  BUN 13  CREATININE 0.86  CALCIUM 8.6*  AST 44*  ALT 28  ALKPHOS 97  BILITOT 1.3*   Assessment and Plan  Joshua Berry is a 77 yo with h/o NASH cirrhosis, HTN, and DM who presented on 10/31 with AMS.  NH4 89, improving slowly with high doses of lactulose.  CT head showed mixed density subdural hematomas overlying bilateral cerebral hemispheres, stable since 09/15/2023; however there are hyperdense components within both subdural hematoma suggesting interval hemorrhage into these collections.  Repeat CT head after 6 hours did not show any changes.  Patient seen by neurosurgery, no need for surgery intervention.  His confusion resolved. Mental status much improved. Discharged on 11/8 to [facility] facility who did not accept him. TOC currently working on [place] placement.  11/25: HCPOA and rest of the family agreed for full comfort care for now 11/26: Full comfort care.  Family does not want him to leave the hospital  11/27: assumed care for pt dter lisa and wife at bedside-- patient awake and did eat bowl of oatmeal and some coffee per daughter. Discussed again with daughter  limited option for discharge plan. She understands. She will discuss with her family and get back with me.  AMS Hepatic encephalopathy Chronic SDH Cirrhosis Intracranial hemorrhage Uncontrolled type 2 diabetes with hyperglycemia GERD without esophagitis Hypothyroidism Hypomagnesemia   Daughter would like for him to continue having terazosin for now due to prostate issues   Goals of care -full comfort care.     DVT prophylaxis   Code Status: Do not attempt resuscitation (DNR) - Comfort care  Family communication :dter and wife CODE STATUS: DNR/DNI Level of care: Med-Surg Status is: Inpatient Remains inpatient appropriate because: awaiting for family to decide on the placement option that is presented to them.    TOTAL TIME TAKING CARE OF THIS PATIENT: 25 minutes.  >50% time spent on counselling and coordination of care  Note: This dictation was prepared with Dragon dictation along with smaller phrase technology. Any transcriptional errors that result from this process are unintentional.  Enedina Finner M.D    [facility]   CC: Primary care physician; [facility]

## 2023-11-12 NOTE — TOC Progression Note (Addendum)
Transition of Care [facility]) - Progression Note    Patient Details  Name: Joshua Berry MRN: [id number] Date of Birth: 02-16-1946  Transition of Care [facility]) CM/SW Contact  Allena Katz, LCSW Phone Number: 11/12/2023, 2:12 PM  Clinical Narrative: Message left with   Marilynne Halsted [phone] RN at [facility] to see if they will approve pt to go to [facility] as patient is now with hospice.    2:39PM  CSW spoke with susan who states that she is going to call [facility] to see if this is possible.   Expected Discharge Plan: Skilled Nursing Facility    Expected Discharge Plan and Services         Expected Discharge Date: 10/24/23                                     Social Determinants of Health (SDOH) Interventions SDOH Screenings   Food Insecurity: Patient Declined (10/17/2023)  Housing: Patient Declined (10/17/2023)  Transportation Needs: Patient Declined (10/17/2023)  Utilities: Patient Declined (10/17/2023)  Depression (PHQ2-9): Low Risk  (04/24/2023)  Financial Resource Strain: Low Risk  (08/06/2023)   Received from [facility]  Social Connections: Unknown (04/29/2022)   Received from [facility]  Tobacco Use: Medium Risk (10/17/2023)    Readmission Risk Interventions    09/15/2023    8:54 AM 08/14/2023    2:18 PM 07/15/2023    8:52 AM  Readmission Risk Prevention Plan  Transportation Screening Complete Complete Complete  PCP or Specialist Appt within 3-5 Days Complete Complete Complete  HRI or Home Care Consult Complete Complete   Social Work Consult for Recovery Care Planning/Counseling Not Complete Complete Complete  Palliative Care Screening Not Applicable Not Applicable Not Applicable  Medication Review Oceanographer) Not Complete Complete Referral to Pharmacy  Med Review Comments Patient confused and disoriented.

## 2023-11-12 NOTE — Progress Notes (Signed)
[facility] Liaison team will follow peripherally until discharge.  [facility] will follow patient at a long term care facility.     Please don't hesitate to call with any Palliative Care or Hospice related questions or concerns.    Thank you for the opportunity to participate in this patient's care. [facility] Liaison [phone]

## 2023-11-12 NOTE — Care Management Important Message (Signed)
Important Message  Patient Details  Name: Joshua Berry MRN: [id number] Date of Birth: October 27, 1946   Important Message Given:  Other (see comment)  Patient is on full comfort care and hospice to follow at [facility] LTC. Out of respect for the patient and family no Important Message from [facility] given.    [name] A [name] 11/12/2023, 10:08 AM

## 2023-11-13 DIAGNOSIS — S065XAA Traumatic subdural hemorrhage with loss of consciousness status unknown, initial encounter: Secondary | ICD-10-CM | POA: Diagnosis not present

## 2023-11-13 NOTE — Progress Notes (Signed)
[facility] Liaison Note  Follow up on current hospice referral.  No plans for discharge today.  Awaiting family decision on discharge options.  Hospital Liaison Team will continue to follow through final disposition.  Please call with any hospice related questions or concerns.  Norris Cross, RN Nurse Liaison [phone]

## 2023-11-13 NOTE — Plan of Care (Addendum)
Patient is alert and oriented X 1. Denies any pain .  Problem: Education: Goal: Ability to describe self-care measures that may prevent or decrease complications (Diabetes Survival Skills Education) will improve Outcome: Progressing Goal: Individualized Educational Video(s) Outcome: Progressing   Problem: Coping: Goal: Ability to adjust to condition or change in health will improve Outcome: Progressing   Problem: Fluid Volume: Goal: Ability to maintain a balanced intake and output will improve Outcome: Progressing   Problem: Health Behavior/Discharge Planning: Goal: Ability to identify and utilize available resources and services will improve Outcome: Progressing Goal: Ability to manage health-related needs will improve Outcome: Progressing   Problem: Metabolic: Goal: Ability to maintain appropriate glucose levels will improve Outcome: Progressing   Problem: Nutritional: Goal: Maintenance of adequate nutrition will improve Outcome: Progressing Goal: Progress toward achieving an optimal weight will improve Outcome: Progressing   Problem: Skin Integrity: Goal: Risk for impaired skin integrity will decrease Outcome: Progressing   Problem: Tissue Perfusion: Goal: Adequacy of tissue perfusion will improve Outcome: Progressing   Problem: Education: Goal: Knowledge of General Education information will improve Description: Including pain rating scale, medication(s)/side effects and non-pharmacologic comfort measures Outcome: Progressing   Problem: Health Behavior/Discharge Planning: Goal: Ability to manage health-related needs will improve Outcome: Progressing   Problem: Clinical Measurements: Goal: Ability to maintain clinical measurements within normal limits will improve Outcome: Progressing Goal: Will remain free from infection Outcome: Progressing Goal: Diagnostic test results will improve Outcome: Progressing Goal: Respiratory complications will  improve Outcome: Progressing Goal: Cardiovascular complication will be avoided Outcome: Progressing   Problem: Activity: Goal: Risk for activity intolerance will decrease Outcome: Progressing   Problem: Nutrition: Goal: Adequate nutrition will be maintained Outcome: Progressing   Problem: Coping: Goal: Level of anxiety will decrease Outcome: Progressing   Problem: Elimination: Goal: Will not experience complications related to bowel motility Outcome: Progressing Goal: Will not experience complications related to urinary retention Outcome: Progressing   Problem: Pain Management: Goal: General experience of comfort will improve Outcome: Progressing   Problem: Safety: Goal: Ability to remain free from injury will improve Outcome: Progressing   Problem: Skin Integrity: Goal: Risk for impaired skin integrity will decrease Outcome: Progressing

## 2023-11-13 NOTE — Progress Notes (Signed)
[facility]  - [place] at [facility]   PATIENT NAME: Joshua Berry    MR#:  [id number]  DATE OF BIRTH:  November 03, 1946  SUBJECTIVE:  daughter  at bedside. Patient eating BF  Awake.  VITALS:  Blood pressure (!) 111/59, pulse (!) 59, temperature 98 F (36.7 C), temperature source Oral, resp. rate 16, height 6\' 2"  (1.88 m), weight 97.4 kg, SpO2 100%.  PHYSICAL EXAMINATION:  limited patient is comfort care GENERAL:  77 y.o.-year-old patient with no acute distress.  NEUROLOGIC:  patient is alert  LABORATORY PANEL:  CBC Recent Labs  Lab 11/10/23 0555  WBC 2.4*  HGB 9.3*  HCT 26.9*  PLT 42*    Chemistries  Recent Labs  Lab 11/10/23 0555  NA 138  K 3.6  CL 109  CO2 21*  GLUCOSE 110*  BUN 13  CREATININE 0.86  CALCIUM 8.6*  AST 44*  ALT 28  ALKPHOS 97  BILITOT 1.3*   Assessment and Plan  Joshua Berry is a 77 yo with h/o NASH cirrhosis, HTN, and DM who presented on 10/31 with AMS.  NH4 89, improving slowly with high doses of lactulose.  CT head showed mixed density subdural hematomas overlying bilateral cerebral hemispheres, stable since 09/15/2023; however there are hyperdense components within both subdural hematoma suggesting interval hemorrhage into these collections.  Repeat CT head after 6 hours did not show any changes.  Patient seen by neurosurgery, no need for surgery intervention.  His confusion resolved. Mental status much improved. Discharged on 11/8 to [facility] facility who did not accept him. TOC currently working on [place] placement.  11/25: HCPOA and rest of the family agreed for full comfort care for now 11/26: Full comfort care.  Family does not want him to leave the hospital  11/27: assumed care for pt dter Joshua Berry and Joshua Berry at bedside-- patient awake and did eat bowl of oatmeal and some coffee per daughter. Discussed again with daughter limited option for discharge plan. She understands. She will discuss with her family and get back with me. 11/28--cont  comfort care  AMS Hepatic encephalopathy Chronic SDH Cirrhosis Intracranial hemorrhage Uncontrolled type 2 diabetes with hyperglycemia GERD without esophagitis Hypothyroidism Hypomagnesemia   Daughter would like for him to continue having terazosin for now due to prostate issues   Goals of care -full comfort care.     DVT prophylaxis   Code Status: Do not attempt resuscitation (DNR) - Comfort care  Family communication :dter and Joshua Berry CODE STATUS: DNR/DNI Level of care: Med-Surg Status is: Inpatient Remains inpatient appropriate because: awaiting for family to decide on the placement option that is presented to them.    TOTAL TIME TAKING CARE OF THIS PATIENT: 25 minutes.  >50% time spent on counselling and coordination of care  Note: This dictation was prepared with Dragon dictation along with smaller phrase technology. Any transcriptional errors that result from this process are unintentional.  Enedina Finner M.D    [facility]   CC: Primary care physician; [facility]

## 2023-11-13 NOTE — Plan of Care (Signed)

## 2023-11-14 DIAGNOSIS — S065XAA Traumatic subdural hemorrhage with loss of consciousness status unknown, initial encounter: Secondary | ICD-10-CM | POA: Diagnosis not present

## 2023-11-14 NOTE — Progress Notes (Signed)
  [facility] Liaison Note   Follow up on current hospice referral.  No plans for discharge today.  Awaiting family decision on discharge options.   Hospital Liaison Team will continue to follow through final disposition.   Please call with any hospice related questions or concerns.    [facility] Liaison [phone]

## 2023-11-14 NOTE — Plan of Care (Signed)

## 2023-11-14 NOTE — Plan of Care (Signed)
  Problem: Education: Goal: Ability to describe self-care measures that may prevent or decrease complications (Diabetes Survival Skills Education) will improve Outcome: Progressing Goal: Individualized Educational Video(s) Outcome: Progressing   Problem: Coping: Goal: Ability to adjust to condition or change in health will improve Outcome: Progressing   Problem: Fluid Volume: Goal: Ability to maintain a balanced intake and output will improve Outcome: Progressing   Problem: Health Behavior/Discharge Planning: Goal: Ability to identify and utilize available resources and services will improve Outcome: Progressing Goal: Ability to manage health-related needs will improve Outcome: Progressing   Problem: Nutritional: Goal: Maintenance of adequate nutrition will improve Outcome: Progressing Goal: Progress toward achieving an optimal weight will improve Outcome: Progressing   

## 2023-11-14 NOTE — Progress Notes (Signed)
[facility]  - [place] at [facility]   PATIENT NAME: Joshua Berry    MR#:  [id number]  DATE OF BIRTH:  03-Nov-1946  SUBJECTIVE:  No family in the room during my visit today at bedside. Patient resting  VITALS:  Blood pressure 104/61, pulse (!) 55, temperature 98.2 F (36.8 C), resp. rate 14, height 6\' 2"  (1.88 m), weight 97.4 kg, SpO2 100%.  PHYSICAL EXAMINATION:  limited patient is comfort care GENERAL:  77 y.o.-year-old patient with no acute distress.  NEUROLOGIC:  patient is sleeping  LABORATORY PANEL:  CBC Recent Labs  Lab 11/10/23 0555  WBC 2.4*  HGB 9.3*  HCT 26.9*  PLT 42*    Chemistries  Recent Labs  Lab 11/10/23 0555  NA 138  K 3.6  CL 109  CO2 21*  GLUCOSE 110*  BUN 13  CREATININE 0.86  CALCIUM 8.6*  AST 44*  ALT 28  ALKPHOS 97  BILITOT 1.3*   Assessment and Plan  Joshua Berry is a 77 yo with h/o NASH cirrhosis, HTN, and DM who presented on 10/31 with AMS.  NH4 89, improving slowly with high doses of lactulose.  CT head showed mixed density subdural hematomas overlying bilateral cerebral hemispheres, stable since 09/15/2023; however there are hyperdense components within both subdural hematoma suggesting interval hemorrhage into these collections.  Repeat CT head after 6 hours did not show any changes.  Patient seen by neurosurgery, no need for surgery intervention.  His confusion resolved. Mental status much improved. Discharged on 11/8 to [facility] facility who did not accept him. TOC currently working on [place] placement.  11/25: HCPOA and rest of the family agreed for full comfort care for now 11/26: Full comfort care.  Family does not want him to leave the hospital  11/27: assumed care for pt dter lisa and wife at bedside-- patient awake and did eat bowl of oatmeal and some coffee per daughter. Discussed again with daughter limited option for discharge plan. She understands. She will discuss with her family and get back with me. 11/28--cont  comfort care  AMS Hepatic encephalopathy Chronic SDH Cirrhosis Intracranial hemorrhage Uncontrolled type 2 diabetes with hyperglycemia GERD without esophagitis Hypothyroidism Hypomagnesemia   Daughter would like for him to continue having terazosin for now due to prostate issues   Goals of care -full comfort care.     DVT prophylaxis   Code Status: Do not attempt resuscitation (DNR) - Comfort care  Family communication :none today CODE STATUS: DNR/DNI Level of care: Med-Surg Status is: Inpatient Remains inpatient appropriate because: Limited option and family does not want to consider the option. Per [facility] leadership has been notified. (Patient relation)    TOTAL TIME TAKING CARE OF THIS PATIENT: 25 minutes.  >50% time spent on counselling and coordination of care  Note: This dictation was prepared with Dragon dictation along with smaller phrase technology. Any transcriptional errors that result from this process are unintentional.  Enedina Finner M.D    [facility]   CC: Primary care physician; [facility]

## 2023-11-14 NOTE — TOC Progression Note (Signed)
Transition of Care [facility]) - Progression Note    Patient Details  Name: Joshua Berry MRN: [id number] Date of Birth: 1946-01-24  Transition of Care [facility]) CM/SW Contact  Allena Katz, LCSW Phone Number: 11/14/2023, 10:56 AM  Clinical Narrative:   CSW spoke with daughter informing her that the [facility] [place] has said they can evaluate for their hospice house. The daughter has declined stating that it was too far. CSW informed her of options to private pay locally. Daughter refused and also refused [facility] of [facility]. Daughter wishes to continue to escalate with the [place]. Informed daughter upper leadership may be calling as we cannot continue to keep patient in the hospital.     Expected Discharge Plan: Skilled Nursing Facility    Expected Discharge Plan and Services         Expected Discharge Date: 10/24/23                                     Social Determinants of Health (SDOH) Interventions SDOH Screenings   Food Insecurity: Patient Declined (10/17/2023)  Housing: Patient Declined (10/17/2023)  Transportation Needs: Patient Declined (10/17/2023)  Utilities: Patient Declined (10/17/2023)  Depression (PHQ2-9): Low Risk  (04/24/2023)  Financial Resource Strain: Low Risk  (08/06/2023)   [facility]  Social Connections: Unknown (04/29/2022)   Received from [facility], [facility]  Tobacco Use: Medium Risk (10/17/2023)    Readmission Risk Interventions    09/15/2023    8:54 AM 08/14/2023    2:18 PM 07/15/2023    8:52 AM  Readmission Risk Prevention Plan  Transportation Screening Complete Complete Complete  PCP or Specialist Appt within 3-5 Days Complete Complete Complete  HRI or Home Care Consult Complete Complete   Social Work Consult for Recovery Care Planning/Counseling Not Complete Complete Complete  Palliative Care Screening Not Applicable Not Applicable Not Applicable  Medication Review Oceanographer) Not Complete Complete Referral to  Pharmacy  Med Review Comments Patient confused and disoriented.

## 2023-11-15 DIAGNOSIS — S065XAA Traumatic subdural hemorrhage with loss of consciousness status unknown, initial encounter: Secondary | ICD-10-CM | POA: Diagnosis not present

## 2023-11-15 NOTE — Progress Notes (Signed)
[facility]  - [place] at [facility]   PATIENT NAME: Joshua Berry    MR#:  [id number]  DATE OF BIRTH:  03/23/1946  SUBJECTIVE:  patient awake and alert. Eating well. Daughter at bedside.  VITALS:  Blood pressure (!) 104/58, pulse (!) 56, temperature 98 F (36.7 C), resp. rate 17, height 6\' 2"  (1.88 m), weight 97.4 kg, SpO2 98%.  PHYSICAL EXAMINATION:  limited patient is comfort care GENERAL:  77 y.o.-year-old patient with no acute distress.  NEUROLOGIC:  patient is awake and alert  LABORATORY PANEL:  CBC Recent Labs  Lab 11/10/23 0555  WBC 2.4*  HGB 9.3*  HCT 26.9*  PLT 42*    Chemistries  Recent Labs  Lab 11/10/23 0555  NA 138  K 3.6  CL 109  CO2 21*  GLUCOSE 110*  BUN 13  CREATININE 0.86  CALCIUM 8.6*  AST 44*  ALT 28  ALKPHOS 97  BILITOT 1.3*   Assessment and Plan  Joshua Berry is a 77 yo with h/o NASH cirrhosis, HTN, and DM who presented on 10/31 with AMS.  NH4 89, improving slowly with high doses of lactulose.  CT head showed mixed density subdural hematomas overlying bilateral cerebral hemispheres, stable since 09/15/2023; however there are hyperdense components within both subdural hematoma suggesting interval hemorrhage into these collections.  Repeat CT head after 6 hours did not show any changes.  Patient seen by neurosurgery, no need for surgery intervention.  His confusion resolved. Mental status much improved. Discharged on 11/8 to [facility] facility who did not accept him. TOC currently working on [place] placement.  11/25: HCPOA and rest of the family agreed for full comfort care for now 11/26: Full comfort care.  Family does not want him to leave the hospital  11/27: assumed care for pt dter Joshua Berry and wife at bedside-- patient awake and did eat bowl of oatmeal and some coffee per daughter. Discussed again with daughter limited option for discharge plan. She understands. She will discuss with her family and get back with me. 11/28--cont comfort  care  AMS Hepatic encephalopathy Chronic SDH Cirrhosis Intracranial hemorrhage Uncontrolled type 2 diabetes with hyperglycemia GERD without esophagitis Hypothyroidism Hypomagnesemia     Goals of care -full comfort care.     DVT prophylaxis   Code Status: Do not attempt resuscitation (DNR) - Comfort care  Family communication :none today CODE STATUS: DNR/DNI Level of care: Med-Surg Status is: Inpatient Remains inpatient appropriate because: Limited option and family does not want to consider the option. Per [facility] leadership has been notified. (Patient relation)  11/30-- spoke with patient's daughter at bedside. She understands limited options for patients discharge given he is the [place] patient. She is aware upper leadership is involved. At this time she is in agreement to going to the facility that was offered at [facility] and rehab. Discussed with TOC today to try call facility and see if they still have a bed available  Patient's insurance authorization expires on December 12.    TOTAL TIME TAKING CARE OF THIS PATIENT: 25 minutes.  >50% time spent on counselling and coordination of care  Note: This dictation was prepared with Dragon dictation along with smaller phrase technology. Any transcriptional errors that result from this process are unintentional.  Enedina Finner M.D    [facility]   CC: Primary care physician; [facility]

## 2023-11-15 NOTE — TOC Progression Note (Signed)
Transition of Care [facility]) - Progression Note    Patient Details  Name: Joshua Berry MRN: [id number] Date of Birth: [birth year]  Transition of Care [facility]) CM/SW Contact  Susa Simmonds, [place] Phone Number: 11/15/2023, 10:58 AM  Clinical Narrative: CSW contacted T[facility] Endo with [facility]. Tresa Endo contacted her admissions coordinator who confirmed they do have beds for Monday but asked TOC to contact them Monday morning to confirm they can take patient.     Expected Discharge Plan: Skilled Nursing Facility    Expected Discharge Plan and Services         Expected Discharge Date: 10/24/23                                     Social Determinants of Health (SDOH) Interventions SDOH Screenings   Food Insecurity: Patient Declined (10/17/2023)  Housing: Patient Declined (10/17/2023)  Transportation Needs: Patient Declined (10/17/2023)  Utilities: Patient Declined (10/17/2023)  Depression (PHQ2-9): Low Risk  (04/24/2023)  Financial Resource Strain: Low Risk  (08/06/2023)   Received from [facility]  Social Connections: Unknown (04/29/2022)   Received from [facility], [facility]  Tobacco Use: Medium Risk (10/17/2023)    Readmission Risk Interventions    09/15/2023    8:54 AM 08/14/2023    2:18 PM 07/15/2023    8:52 AM  Readmission Risk Prevention Plan  Transportation Screening Complete Complete Complete  PCP or Specialist Appt within 3-5 Days Complete Complete Complete  HRI or Home Care Consult Complete Complete   Social Work Consult for Recovery Care Planning/Counseling Not Complete Complete Complete  Palliative Care Screening Not Applicable Not Applicable Not Applicable  Medication Review Oceanographer) Not Complete Complete Referral to Pharmacy  Med Review Comments Patient confused and disoriented.

## 2023-11-15 NOTE — Plan of Care (Addendum)
 Patient is alert and oriented X 1. Denies any pain .  Problem: Education: Goal: Ability to describe self-care measures that may prevent or decrease complications (Diabetes Survival Skills Education) will improve Outcome: Progressing Goal: Individualized Educational Video(s) Outcome: Progressing   Problem: Coping: Goal: Ability to adjust to condition or change in health will improve Outcome: Progressing   Problem: Fluid Volume: Goal: Ability to maintain a balanced intake and output will improve Outcome: Progressing   Problem: Health Behavior/Discharge Planning: Goal: Ability to identify and utilize available resources and services will improve Outcome: Progressing Goal: Ability to manage health-related needs will improve Outcome: Progressing   Problem: Metabolic: Goal: Ability to maintain appropriate glucose levels will improve Outcome: Progressing   Problem: Nutritional: Goal: Maintenance of adequate nutrition will improve Outcome: Progressing Goal: Progress toward achieving an optimal weight will improve Outcome: Progressing   Problem: Skin Integrity: Goal: Risk for impaired skin integrity will decrease Outcome: Progressing   Problem: Tissue Perfusion: Goal: Adequacy of tissue perfusion will improve Outcome: Progressing   Problem: Education: Goal: Knowledge of General Education information will improve Description: Including pain rating scale, medication(s)/side effects and non-pharmacologic comfort measures Outcome: Progressing   Problem: Health Behavior/Discharge Planning: Goal: Ability to manage health-related needs will improve Outcome: Progressing   Problem: Clinical Measurements: Goal: Ability to maintain clinical measurements within normal limits will improve Outcome: Progressing Goal: Will remain free from infection Outcome: Progressing Goal: Diagnostic test results will improve Outcome: Progressing Goal: Respiratory complications will  improve Outcome: Progressing Goal: Cardiovascular complication will be avoided Outcome: Progressing   Problem: Activity: Goal: Risk for activity intolerance will decrease Outcome: Progressing   Problem: Nutrition: Goal: Adequate nutrition will be maintained Outcome: Progressing   Problem: Coping: Goal: Level of anxiety will decrease Outcome: Progressing   Problem: Elimination: Goal: Will not experience complications related to bowel motility Outcome: Progressing Goal: Will not experience complications related to urinary retention Outcome: Progressing   Problem: Pain Management: Goal: General experience of comfort will improve Outcome: Progressing   Problem: Safety: Goal: Ability to remain free from injury will improve Outcome: Progressing   Problem: Skin Integrity: Goal: Risk for impaired skin integrity will decrease Outcome: Progressing

## 2023-11-15 NOTE — Plan of Care (Signed)

## 2023-11-16 DIAGNOSIS — S065XAA Traumatic subdural hemorrhage with loss of consciousness status unknown, initial encounter: Secondary | ICD-10-CM | POA: Diagnosis not present

## 2023-11-16 MED ORDER — BISACODYL 5 MG PO TBEC
10.0000 mg | DELAYED_RELEASE_TABLET | Freq: Every day | ORAL | Status: DC
  Administered 2023-11-16 – 2023-11-22 (×7): 10 mg via ORAL
  Filled 2023-11-16 (×7): qty 2

## 2023-11-16 NOTE — Plan of Care (Addendum)
Patient is alert and oriented X 2. Denies any pain.   Problem: Education: Goal: Ability to describe self-care measures that may prevent or decrease complications (Diabetes Survival Skills Education) will improve Outcome: Progressing Goal: Individualized Educational Video(s) Outcome: Progressing   Problem: Coping: Goal: Ability to adjust to condition or change in health will improve Outcome: Progressing   Problem: Fluid Volume: Goal: Ability to maintain a balanced intake and output will improve Outcome: Progressing   Problem: Health Behavior/Discharge Planning: Goal: Ability to identify and utilize available resources and services will improve Outcome: Progressing Goal: Ability to manage health-related needs will improve Outcome: Progressing   Problem: Metabolic: Goal: Ability to maintain appropriate glucose levels will improve Outcome: Progressing   Problem: Nutritional: Goal: Maintenance of adequate nutrition will improve Outcome: Progressing Goal: Progress toward achieving an optimal weight will improve Outcome: Progressing   Problem: Skin Integrity: Goal: Risk for impaired skin integrity will decrease Outcome: Progressing   Problem: Tissue Perfusion: Goal: Adequacy of tissue perfusion will improve Outcome: Progressing   Problem: Education: Goal: Knowledge of General Education information will improve Description: Including pain rating scale, medication(s)/side effects and non-pharmacologic comfort measures Outcome: Progressing   Problem: Health Behavior/Discharge Planning: Goal: Ability to manage health-related needs will improve Outcome: Progressing   Problem: Clinical Measurements: Goal: Ability to maintain clinical measurements within normal limits will improve Outcome: Progressing Goal: Will remain free from infection Outcome: Progressing Goal: Diagnostic test results will improve Outcome: Progressing Goal: Respiratory complications will  improve Outcome: Progressing Goal: Cardiovascular complication will be avoided Outcome: Progressing   Problem: Activity: Goal: Risk for activity intolerance will decrease Outcome: Progressing   Problem: Nutrition: Goal: Adequate nutrition will be maintained Outcome: Progressing   Problem: Coping: Goal: Level of anxiety will decrease Outcome: Progressing   Problem: Elimination: Goal: Will not experience complications related to bowel motility Outcome: Progressing Goal: Will not experience complications related to urinary retention Outcome: Progressing   Problem: Pain Management: Goal: General experience of comfort will improve Outcome: Progressing   Problem: Safety: Goal: Ability to remain free from injury will improve Outcome: Progressing   Problem: Skin Integrity: Goal: Risk for impaired skin integrity will decrease Outcome: Progressing

## 2023-11-16 NOTE — Plan of Care (Signed)

## 2023-11-16 NOTE — Progress Notes (Signed)
[facility]  - [place] at [facility]   PATIENT NAME: Joshua Berry    MR#:  [id number]  DATE OF BIRTH:  12/19/1945  SUBJECTIVE:  patient awake and alert. Eating well. No family at bedside earleir today  VITALS:  Blood pressure (!) 106/56, pulse (!) 58, temperature 97.6 F (36.4 C), resp. rate 18, height 6\' 2"  (1.88 m), weight 97.4 kg, SpO2 99%.  PHYSICAL EXAMINATION:  limited patient is comfort care GENERAL:  77 y.o.-year-old patient with no acute distress.  NEUROLOGIC:  patient is awake and alert  LABORATORY PANEL:  CBC Recent Labs  Lab 11/10/23 0555  WBC 2.4*  HGB 9.3*  HCT 26.9*  PLT 42*    Chemistries  Recent Labs  Lab 11/10/23 0555  NA 138  K 3.6  CL 109  CO2 21*  GLUCOSE 110*  BUN 13  CREATININE 0.86  CALCIUM 8.6*  AST 44*  ALT 28  ALKPHOS 97  BILITOT 1.3*   Assessment and Plan  Joshua Berry is a 77 yo with h/o NASH cirrhosis, HTN, and DM who presented on 10/31 with AMS.  NH4 89, improving slowly with high doses of lactulose.  CT head showed mixed density subdural hematomas overlying bilateral cerebral hemispheres, stable since 09/15/2023; however there are hyperdense components within both subdural hematoma suggesting interval hemorrhage into these collections.  Repeat CT head after 6 hours did not show any changes.  Patient seen by neurosurgery, no need for surgery intervention.  His confusion resolved. Mental status much improved. Discharged on 11/8 to [facility] facility who did not accept him. TOC currently working on [place] placement.  11/25: HCPOA and rest of the family agreed for full comfort care for now 11/26: Full comfort care.  Family does not want him to leave the hospital  11/27: assumed care for pt dter Joshua Berry and wife at bedside-- patient awake and did eat bowl of oatmeal and some coffee per daughter. Discussed again with daughter limited option for discharge plan. She understands. She will discuss with her family and get back with  me. 11/28--cont comfort care  AMS Hepatic encephalopathy Chronic SDH Cirrhosis Intracranial hemorrhage Uncontrolled type 2 diabetes with hyperglycemia GERD without esophagitis Hypothyroidism Hypomagnesemia     Goals of care -full comfort care.     DVT prophylaxis   Code Status: Do not attempt resuscitation (DNR) - Comfort care  Family communication :none today CODE STATUS: DNR/DNI Level of care: Med-Surg Status is: Inpatient Remains inpatient appropriate because: Limited option and family does not want to consider the option. Per [facility] leadership has been notified. (Patient relation)  11/30-- spoke with patient's daughter at bedside. She understands limited options for patients discharge given he is the [place] patient. She is aware upper leadership is involved. At this time she is in agreement to going to the facility that was offered at [facility] and rehab. Discussed with TOC today to try call facility and see if they still have a bed available  Patient's insurance authorization expires on December 12.    TOTAL TIME TAKING CARE OF THIS PATIENT: 25 minutes.  >50% time spent on counselling and coordination of care  Note: This dictation was prepared with Dragon dictation along with smaller phrase technology. Any transcriptional errors that result from this process are unintentional.  Enedina Finner M.D    [facility]   CC: Primary care physician; [facility]

## 2023-11-17 DIAGNOSIS — S065XAA Traumatic subdural hemorrhage with loss of consciousness status unknown, initial encounter: Secondary | ICD-10-CM | POA: Diagnosis not present

## 2023-11-17 NOTE — TOC Progression Note (Signed)
Transition of Care [facility]) - Progression Note    Patient Details  Name: Joshua Berry MRN: [id number] Date of Birth: 07-24-1946  Transition of Care [facility]) CM/SW Contact  Allena Katz, LCSW Phone Number: 11/17/2023, 2:32 PM  Clinical Narrative:   CSW spoke with Randa Evens at [facility] who report that they now do not have a bed. Randa Evens states she will call me Thursday or Friday and let me know if they have a bed.    Expected Discharge Plan: Skilled Nursing Facility    Expected Discharge Plan and Services         Expected Discharge Date: 10/24/23                                     Social Determinants of Health (SDOH) Interventions SDOH Screenings   Food Insecurity: Patient Declined (10/17/2023)  Housing: Patient Declined (10/17/2023)  Transportation Needs: Patient Declined (10/17/2023)  Utilities: Patient Declined (10/17/2023)  Depression (PHQ2-9): Low Risk  (04/24/2023)  Financial Resource Strain: Low Risk  (08/06/2023)   Received from [facility]  Social Connections: Unknown (04/29/2022)   Received from [facility]  Tobacco Use: Medium Risk (10/17/2023)    Readmission Risk Interventions    09/15/2023    8:54 AM 08/14/2023    2:18 PM 07/15/2023    8:52 AM  Readmission Risk Prevention Plan  Transportation Screening Complete Complete Complete  PCP or Specialist Appt within 3-5 Days Complete Complete Complete  HRI or Home Care Consult Complete Complete   Social Work Consult for Recovery Care Planning/Counseling Not Complete Complete Complete  Palliative Care Screening Not Applicable Not Applicable Not Applicable  Medication Review Oceanographer) Not Complete Complete Referral to Pharmacy  Med Review Comments Patient confused and disoriented.

## 2023-11-17 NOTE — TOC Progression Note (Signed)
Transition of Care [facility]) - Progression Note    Patient Details  Name: Joshua Berry MRN: [id number] Date of Birth: 04-18-1946  Transition of Care [facility]) CM/SW Contact  Allena Katz, LCSW Phone Number: 11/17/2023, 10:46 AM  Clinical Narrative:  CSW waiting to hear back from [facility]. They will call me back after their meeting.     Expected Discharge Plan: Skilled Nursing Facility    Expected Discharge Plan and Services         Expected Discharge Date: 10/24/23                                     Social Determinants of Health (SDOH) Interventions SDOH Screenings   Food Insecurity: Patient Declined (10/17/2023)  Housing: Patient Declined (10/17/2023)  Transportation Needs: Patient Declined (10/17/2023)  Utilities: Patient Declined (10/17/2023)  Depression (PHQ2-9): Low Risk  (04/24/2023)  Financial Resource Strain: Low Risk  (08/06/2023)   Received from [facility]  Social Connections: Unknown (04/29/2022)   Received from [facility]  Tobacco Use: Medium Risk (10/17/2023)    Readmission Risk Interventions    09/15/2023    8:54 AM 08/14/2023    2:18 PM 07/15/2023    8:52 AM  Readmission Risk Prevention Plan  Transportation Screening Complete Complete Complete  PCP or Specialist Appt within 3-5 Days Complete Complete Complete  HRI or Home Care Consult Complete Complete   Social Work Consult for Recovery Care Planning/Counseling Not Complete Complete Complete  Palliative Care Screening Not Applicable Not Applicable Not Applicable  Medication Review Oceanographer) Not Complete Complete Referral to Pharmacy  Med Review Comments Patient confused and disoriented.

## 2023-11-17 NOTE — Care Management Important Message (Signed)
Important Message  Patient Details  Name: [name] MRN: [id number] Date of Birth: June 04, 1946   Important Message Given:  Other (see comment)  Patient is on full comfort care and possible plan to discharge to SNF with hospice. Out of respect for the patient and family no Important Message from [facility] given.   [name] A Kripa Foskey 11/17/2023, 8:55 AM

## 2023-11-17 NOTE — Progress Notes (Signed)
[facility]  -  at [facility]   PATIENT NAME: Joshua Berry    MR#:  [id number]  DATE OF BIRTH:  [birth year]  SUBJECTIVE:  patient awake and alert. Eating well.Dter and wife  at bedside  Eating well  VITALS:  Blood pressure (!) 95/57, pulse (!) 58, temperature 98.2 F (36.8 C), resp. rate 17, height 6\' 2"  (1.88 m), weight 97.4 kg, SpO2 98%.  PHYSICAL EXAMINATION:  limited patient is comfort care GENERAL:  77 y.o.-year-old patient with no acute distress.  NEUROLOGIC:  patient is awake and alert  Assessment and Plan  Joshua Berry is a 77 yo with h/o NASH cirrhosis, HTN, and DM who presented on 10/31 with AMS.  NH4 89, improving slowly with high doses of lactulose.  CT head showed mixed density subdural hematomas overlying bilateral cerebral hemispheres, stable since 09/15/2023; however there are hyperdense components within both subdural hematoma suggesting interval hemorrhage into these collections.  Repeat CT head after 6 hours did not show any changes.  Patient seen by neurosurgery, no need for surgery intervention.  His confusion resolved. Mental status much improved. Discharged on 11/8 to [facility] facility who did not accept him. TOC currently working on [place] placement.  11/25: HCPOA and rest of the family agreed for full comfort care for now 11/26: Full comfort care.  Family does not want him to leave the hospital  11/27: assumed care for pt dter lisa and wife at bedside-- patient awake and did eat bowl of oatmeal and some coffee per daughter. Discussed again with daughter limited option for discharge plan. She understands. She will discuss with her family and get back with me. 11/28--cont comfort care  AMS Hepatic encephalopathy Chronic SDH Cirrhosis Intracranial hemorrhage Uncontrolled type 2 diabetes with hyperglycemia GERD without esophagitis Hypothyroidism Hypomagnesemia     Goals of care -full comfort care.     DVT prophylaxis   Code Status: Do not  attempt resuscitation (DNR) - Comfort care  Family communication :none today CODE STATUS: DNR/DNI Level of care: Med-Surg Status is: Inpatient Remains inpatient appropriate because: Limited option and family does not want to consider the option. Per [facility] leadership has been notified. (Patient relation)  11/30-- spoke with patient's daughter at bedside. She understands limited options for patients discharge given he is the [place] patient. She is aware upper leadership is involved. At this time she is in agreement to going to the facility that was [facility] at [facility] and rehab. Discussed with TOC today to try call facility and see if they still have a bed available  12/2--TOC to let me know if [place] rehab facility can take pt today  Patient's insurance authorization expires on December 12.    TOTAL TIME TAKING CARE OF THIS PATIENT: 25 minutes.  >50% time spent on counselling and coordination of care  Note: This dictation was prepared with Dragon dictation along with smaller phrase technology. Any transcriptional errors that result from this process are unintentional.  Enedina Finner M.D    [facility]   CC: [facility]; [facility]

## 2023-11-18 DIAGNOSIS — S065XAA Traumatic subdural hemorrhage with loss of consciousness status unknown, initial encounter: Secondary | ICD-10-CM | POA: Diagnosis not present

## 2023-11-18 NOTE — Plan of Care (Addendum)
 Patient is alert and oriented X 1. Denies any pain .  Problem: Education: Goal: Ability to describe self-care measures that may prevent or decrease complications (Diabetes Survival Skills Education) will improve Outcome: Progressing Goal: Individualized Educational Video(s) Outcome: Progressing   Problem: Coping: Goal: Ability to adjust to condition or change in health will improve Outcome: Progressing   Problem: Fluid Volume: Goal: Ability to maintain a balanced intake and output will improve Outcome: Progressing   Problem: Health Behavior/Discharge Planning: Goal: Ability to identify and utilize available resources and services will improve Outcome: Progressing Goal: Ability to manage health-related needs will improve Outcome: Progressing   Problem: Metabolic: Goal: Ability to maintain appropriate glucose levels will improve Outcome: Progressing   Problem: Nutritional: Goal: Maintenance of adequate nutrition will improve Outcome: Progressing Goal: Progress toward achieving an optimal weight will improve Outcome: Progressing   Problem: Skin Integrity: Goal: Risk for impaired skin integrity will decrease Outcome: Progressing   Problem: Tissue Perfusion: Goal: Adequacy of tissue perfusion will improve Outcome: Progressing   Problem: Education: Goal: Knowledge of General Education information will improve Description: Including pain rating scale, medication(s)/side effects and non-pharmacologic comfort measures Outcome: Progressing   Problem: Health Behavior/Discharge Planning: Goal: Ability to manage health-related needs will improve Outcome: Progressing   Problem: Clinical Measurements: Goal: Ability to maintain clinical measurements within normal limits will improve Outcome: Progressing Goal: Will remain free from infection Outcome: Progressing Goal: Diagnostic test results will improve Outcome: Progressing Goal: Respiratory complications will  improve Outcome: Progressing Goal: Cardiovascular complication will be avoided Outcome: Progressing   Problem: Activity: Goal: Risk for activity intolerance will decrease Outcome: Progressing   Problem: Nutrition: Goal: Adequate nutrition will be maintained Outcome: Progressing   Problem: Coping: Goal: Level of anxiety will decrease Outcome: Progressing   Problem: Elimination: Goal: Will not experience complications related to bowel motility Outcome: Progressing Goal: Will not experience complications related to urinary retention Outcome: Progressing   Problem: Pain Management: Goal: General experience of comfort will improve Outcome: Progressing   Problem: Safety: Goal: Ability to remain free from injury will improve Outcome: Progressing   Problem: Skin Integrity: Goal: Risk for impaired skin integrity will decrease Outcome: Progressing

## 2023-11-18 NOTE — TOC Progression Note (Signed)
Transition of Care [facility]) - Progression Note    Patient Details  Name: Joshua Berry MRN: [id number] Date of Birth: 09-06-1946  Transition [facility]) CM/SW Contact  Allena Katz, LCSW Phone Number: 11/18/2023, 12:26 PM  Clinical Narrative:   CSW spoke with daughter to inform her [facility] not [facility] a bed and we would need to extend the search to the other [facility] facilities. Daughter verbalizes understanding.     Expected Discharge Plan: Skilled Nursing Facility    Expected Discharge Plan and Services         Expected Discharge Date: 10/24/23                                     Social Determinants of Health (SDOH) Interventions SDOH Screenings   Food Insecurity: Patient Declined (10/17/2023)  Housing: Patient Declined (10/17/2023)  Transportation Needs: Patient Declined (10/17/2023)  Utilities: Patient Declined (10/17/2023)  Depression (PHQ2-9): Low Risk  (04/24/2023)  Financial Resource Strain: Low Risk  (08/06/2023)   [facility]  Social Connections: Unknown (04/29/2022)   Received from [facility]  Tobacco Use: Medium Risk (10/17/2023)    Readmission Risk Interventions    09/15/2023    8:54 AM 08/14/2023    2:18 PM 07/15/2023    8:52 AM  Readmission Risk Prevention Plan  Transportation Screening Complete Complete Complete  PCP or Specialist Appt within 3-5 Days Complete Complete Complete  HRI or Home Care Consult Complete Complete   Social Work Consult for Recovery Care Planning/Counseling Not Complete Complete Complete  Palliative Care Screening Not Applicable Not Applicable Not Applicable  Medication Review Oceanographer) Not Complete Complete Referral to Pharmacy  Med Review Comments Patient confused and disoriented.

## 2023-11-18 NOTE — TOC Progression Note (Addendum)
Transition of Care [facility]) - Progression Note    Patient Details  Name: Joshua Berry MRN: [id number] Date of Birth: [birth year]  Transition of Care [facility]) CM/SW Contact  Allena Katz, LCSW Phone Number: 11/18/2023, 1:01 PM  Clinical Narrative:   Referral hard faxed to the greens at [facility] at [employer] and rehab [facility]  2:12pm [facility] does not have a LTC bed  4:09PM Unable to reach [place] [facility]. CSW lvm [facility] [place],  Expected Discharge Plan: Skilled Nursing Facility    Expected Discharge Plan and Services         Expected Discharge Date: 10/24/23                                     Social Determinants of Health (SDOH) Interventions SDOH Screenings   Food Insecurity: Patient Declined (10/17/2023)  Housing: Patient Declined (10/17/2023)  Transportation Needs: Patient Declined (10/17/2023)  Utilities: Patient Declined (10/17/2023)  Depression (PHQ2-9): Low Risk  (04/24/2023)  Financial Resource Strain: Low Risk  (08/06/2023)   Received from [facility]  Social Connections: Unknown (04/29/2022)   Received from [facility]  Tobacco Use: Medium Risk (10/17/2023)    Readmission Risk Interventions    09/15/2023    8:54 AM 08/14/2023    2:18 PM 07/15/2023    8:52 AM  Readmission Risk Prevention Plan  Transportation Screening Complete Complete Complete  PCP or Specialist Appt within 3-5 Days Complete Complete Complete  HRI or Home Care Consult Complete Complete   Social Work Consult for Recovery Care Planning/Counseling Not Complete Complete Complete  Palliative Care Screening Not Applicable Not Applicable Not Applicable  Medication Review Oceanographer) Not Complete Complete Referral to Pharmacy  Med Review Comments Patient confused and disoriented.

## 2023-11-18 NOTE — Progress Notes (Signed)
[facility]  - [place] at [facility]   PATIENT NAME: Joshua Berry    MR#:  [id number]  DATE OF BIRTH:  1946-11-27  SUBJECTIVE:  patient awake and alert. Eating well. No family at bedside during my visit   VITALS:  Blood pressure 124/64, pulse 63, temperature 98.7 F (37.1 C), resp. rate 15, height 6\' 2"  (1.88 m), weight 97.4 kg, SpO2 100%.  PHYSICAL EXAMINATION:  limited patient is comfort care GENERAL:  77 y.o.-year-old patient with no acute distress.  NEUROLOGIC:  patient is awake and alert  Assessment and Plan  Joshua Berry is a 77 yo with h/o NASH cirrhosis, HTN, and DM who presented on 10/31 with AMS.  NH4 89, improving slowly with high doses of lactulose.  CT head showed mixed density subdural hematomas overlying bilateral cerebral hemispheres, stable since 09/15/2023; however there are hyperdense components within both subdural hematoma suggesting interval hemorrhage into these collections.  Repeat CT head after 6 hours did not show any changes.  Patient seen by neurosurgery, no need for surgery intervention.  His confusion resolved. Mental status much improved. Discharged on 11/8 to [facility] facility who did not accept him. TOC currently working on [place] placement.  11/25: HCPOA and rest of the family agreed for full comfort care for now 11/26: Full comfort care.  Family does not want him to leave the hospital  11/27: assumed care for pt dter lisa and wife at bedside-- patient awake and did eat bowl of oatmeal and some coffee per daughter. Discussed again with daughter limited option for discharge plan. She understands. She will discuss with her family and get back with me. 11/28--cont comfort care  AMS Hepatic encephalopathy Chronic SDH Cirrhosis Intracranial hemorrhage Uncontrolled type 2 diabetes with hyperglycemia GERD without esophagitis Hypothyroidism Hypomagnesemia     Goals of care -full comfort care.     DVT prophylaxis   Code Status: Do not attempt  resuscitation (DNR) - Comfort care  Family communication :none today CODE STATUS: DNR/DNI Level of care: Med-Surg Status is: Inpatient Remains inpatient appropriate because: Limited option and family does not want to consider the option. Per [facility] leadership has been notified. (Patient relation)  11/30-- spoke with patient's daughter at bedside. She understands limited options for patients discharge given he is the [place] patient. She is aware upper leadership is involved. At this time she is in agreement to going to the facility that was offered at [facility] and rehab. Discussed with TOC today to try call facility and see if they still have a bed available  12/2--TOC to let me know if [place] rehab facility can take pt today  12/3--per [facility] rehab facility does not have a bed any longer. TOC has expanded search to other VA facilities.  Patient's insurance authorization expires on December 12.    TOTAL TIME TAKING CARE OF THIS PATIENT: 25 minutes.  >50% time spent on counselling and coordination of care  Note: This dictation was prepared with Dragon dictation along with smaller phrase technology. Any transcriptional errors that result from this process are unintentional.  Enedina Finner M.D    [facility]   CC: Primary care physician; [facility]

## 2023-11-18 NOTE — Plan of Care (Signed)

## 2023-11-19 MED ORDER — MAGNESIUM HYDROXIDE 400 MG/5ML PO SUSP
30.0000 mL | Freq: Once | ORAL | Status: DC
  Filled 2023-11-19: qty 30

## 2023-11-19 MED ORDER — ORAL CARE MOUTH RINSE: 15.0000 mL | OROMUCOSAL | Status: DC | PRN

## 2023-11-19 MED ORDER — LACTULOSE 10 GM/15ML PO SOLN: 30.0000 g | Freq: Once | ORAL | Status: DC | PRN

## 2023-11-19 MED ORDER — LACTULOSE ENEMA
300.0000 mL | Freq: Once | RECTAL | Status: DC
Start: 2023-11-19 — End: 2023-11-19
  Filled 2023-11-19: qty 300

## 2023-11-19 MED ORDER — ORAL CARE MOUTH RINSE
15.0000 mL | OROMUCOSAL | Status: DC
  Administered 2023-11-19 – 2023-11-24 (×13): 15 mL via OROMUCOSAL

## 2023-11-19 NOTE — TOC Progression Note (Addendum)
Transition of Care [facility]) - Progression Note    Patient Details  Name: Joshua Berry MRN: [id number] Date of Birth: [birth year]  Transition of Care [facility]) CM/SW Contact  Allena Katz, LCSW Phone Number: 11/19/2023, 9:42 AM  Clinical Narrative:   Message left for [facility] admissions. Message also left for Randa Evens at [facility] to see if they have a bed yet.    Expected Discharge Plan: Skilled Nursing Facility    Expected Discharge Plan and Services         Expected Discharge Date: 10/24/23                                     Social Determinants of Health (SDOH) Interventions SDOH Screenings   Food Insecurity: Patient Declined (10/17/2023)  Housing: Patient Declined (10/17/2023)  Transportation Needs: Patient Declined (10/17/2023)  Utilities: Patient Declined (10/17/2023)  Depression (PHQ2-9): Low Risk  (04/24/2023)  Financial Resource Strain: Low Risk  (08/06/2023)   Received from [facility]  Social Connections: Unknown (04/29/2022)   Received [facility]  Tobacco Use: Medium Risk (10/17/2023)    Readmission Risk Interventions    09/15/2023    8:54 AM 08/14/2023    2:18 PM 07/15/2023    8:52 AM  Readmission Risk Prevention Plan  Transportation Screening Complete Complete Complete  PCP or Specialist Appt within 3-5 Days Complete Complete Complete  HRI or Home Care Consult Complete Complete   Social Work Consult for Recovery Care Planning/Counseling Not Complete Complete Complete  Palliative Care Screening Not Applicable Not Applicable Not Applicable  Medication Review Oceanographer) Not Complete Complete Referral to Pharmacy  Med Review Comments Patient confused and disoriented.

## 2023-11-19 NOTE — Progress Notes (Signed)
Attempted to ambulate pt from bed to chair and he is currently unable to do so. Joshua Berry had trouble even getting to a standing point today.

## 2023-11-19 NOTE — TOC Progression Note (Addendum)
Transition of Care [facility]) - Progression Note    Patient Details  Name: Joshua Berry MRN: [id number] Date of Birth: [birth year]  Transition of Care [facility]) CM/SW Contact  Allena Katz, LCSW Phone Number: 11/19/2023, 2:50 PM  Clinical Narrative:    [facility] [place]. Daughter notified.   Expected Discharge Plan: Skilled Nursing Facility    Expected Discharge Plan and Services         Expected Discharge Date: 10/24/23                                     Social Determinants of Health (SDOH) Interventions SDOH Screenings   Food Insecurity: Patient Declined (10/17/2023)  Housing: Patient Declined (10/17/2023)  Transportation Needs: Patient Declined (10/17/2023)  Utilities: Patient Declined (10/17/2023)  Depression (PHQ2-9): Low Risk  (04/24/2023)  Financial Resource Strain: Low Risk  (08/06/2023)   Received from [facility]  Social Connections: Unknown (04/29/2022)   Received from [facility]  Tobacco Use: Medium Risk (10/17/2023)    Readmission Risk Interventions    09/15/2023    8:54 AM 08/14/2023    2:18 PM 07/15/2023    8:52 AM  Readmission Risk Prevention Plan  Transportation Screening Complete Complete Complete  PCP or Specialist Appt within 3-5 Days Complete Complete Complete  HRI or Home Care Consult Complete Complete   Social Work Consult for Recovery Care Planning/Counseling Not Complete Complete Complete  Palliative Care Screening Not Applicable Not Applicable Not Applicable  Medication Review Oceanographer) Not Complete Complete Referral to Pharmacy  Med Review Comments Patient confused and disoriented.

## 2023-11-19 NOTE — Plan of Care (Signed)
  Problem: Coping: Goal: Ability to adjust to condition or change in health will improve Outcome: Progressing   Problem: Fluid Volume: Goal: Ability to maintain a balanced intake and output will improve Outcome: Progressing   Problem: Health Behavior/Discharge Planning: Goal: Ability to identify and utilize available resources and services will improve Outcome: Progressing Goal: Ability to manage health-related needs will improve Outcome: Progressing   Problem: Metabolic: Goal: Ability to maintain appropriate glucose levels will improve Outcome: Progressing   Problem: Nutritional: Goal: Maintenance of adequate nutrition will improve Outcome: Progressing Goal: Progress toward achieving an optimal weight will improve Outcome: Progressing   Problem: Tissue Perfusion: Goal: Adequacy of tissue perfusion will improve Outcome: Progressing   Problem: Health Behavior/Discharge Planning: Goal: Ability to manage health-related needs will improve Outcome: Progressing   Problem: Clinical Measurements: Goal: Ability to maintain clinical measurements within normal limits will improve Outcome: Progressing Goal: Will remain free from infection Outcome: Progressing Goal: Diagnostic test results will improve Outcome: Progressing Goal: Respiratory complications will improve Outcome: Progressing Goal: Cardiovascular complication will be avoided Outcome: Progressing   Problem: Activity: Goal: Risk for activity intolerance will decrease Outcome: Progressing   Problem: Nutrition: Goal: Adequate nutrition will be maintained Outcome: Progressing   Problem: Coping: Goal: Level of anxiety will decrease Outcome: Progressing   Problem: Elimination: Goal: Will not experience complications related to urinary retention Outcome: Progressing   Problem: Pain Management: Goal: General experience of comfort will improve Outcome: Progressing   Problem: Safety: Goal: Ability to remain free  from injury will improve Outcome: Progressing

## 2023-11-19 NOTE — Progress Notes (Signed)
[facility]  - [place] at [facility]   PATIENT NAME: Joshua Berry    MR#:  [id number]  DATE OF BIRTH:  Jul 02, 1946  SUBJECTIVE:  patient awake and denies pain or other complaints   VITALS:  Blood pressure 99/61, pulse (!) 57, temperature (!) 97.5 F (36.4 C), resp. rate 15, height 6\' 2"  (1.88 m), weight 97.4 kg, SpO2 99%.  PHYSICAL EXAMINATION:  limited patient is comfort care GENERAL:  77 y.o.-year-old patient with no acute distress.  NEUROLOGIC:  patient is awake and alert Abdomen: soft, non-tender Ext: warm, no edema  Assessment and Plan  Joshua Berry is a 77 yo with h/o NASH cirrhosis, HTN, and DM who presented on 10/31 with AMS.  NH4 89, improving slowly with high doses of lactulose.  CT head showed mixed density subdural hematomas overlying bilateral cerebral hemispheres, stable since 09/15/2023; however there are hyperdense components within both subdural hematoma suggesting interval hemorrhage into these collections.  Repeat CT head after 6 hours did not show any changes.  Patient seen by neurosurgery, no need for surgery intervention.  His confusion resolved. Mental status much improved. Discharged on 11/8 to [facility] facility who did not accept him. TOC currently working on [place] placement.  11/25: HCPOA and rest of the family agreed for full comfort care for now 11/26: Full comfort care.  Family does not want him to leave the hospital  11/27: assumed care for pt dter lisa and wife at bedside-- patient awake and did eat bowl of oatmeal and some coffee per daughter. Discussed again with daughter limited option for discharge plan. She understands. She will discuss with her family and get back with me. 11/28--cont comfort care  AMS Hepatic encephalopathy Chronic SDH Cirrhosis Intracranial hemorrhage Uncontrolled type 2 diabetes with hyperglycemia GERD without esophagitis Hypothyroidism Hypomagnesemia     Goals of care -full comfort care.    Constipation:  needs bm before can leave tomorrow, will order laxative, enema prn    DVT prophylaxis   Code Status: Do not attempt resuscitation (DNR) - Comfort care  Family communication :none today CODE STATUS: DNR/DNI Level of care: Med-Surg Status is: Inpatient Remains inpatient appropriate because: Limited option and family does not want to consider the option. Per [facility] leadership has been notified. (Patient relation)  11/30-- spoke with patient's daughter at bedside. She understands limited options for patients discharge given he is the [place] patient. She is aware upper leadership is involved. At this time she is in agreement to going to the facility that was offered at [facility] and rehab. Discussed with TOC today to try call facility and see if they still have a bed available  12/2--TOC to let me know if [place] rehab facility can take pt today  12/3--per [facility] rehab facility does not have a bed any longer. TOC has expanded search to other VA facilities.  Patient's insurance authorization expires on December 12.  12/4-- we may be able to d/c tomorrow if patient can have a bm tonight     Silvano Bilis M.D    [facility]

## 2023-11-19 NOTE — Progress Notes (Signed)
ON CALL PROVIDER NOTIFIED VIA SECURE CHAT AS FOLLOWS: "THIS PATIENT HAS NOT HAD A BM SINCE 11/27. HE IS COMFORT CARE, BUT HE IS RECEIVING DULCOLAX DAILY. HE DOES HAVE BOWEL SOUNDS. DO YOU WANT TO DO ANY INTERVENTIONS?" RESPONSE WAS NO INTERVENTIONS AT THIS TIME DUE TO HIS STATUS BUT HE WOULD LEAVE A MESSAGE FOR DAYTIME PROVIDER.

## 2023-11-20 MED ORDER — LACTULOSE 10 GM/15ML PO SOLN
20.0000 g | Freq: Every day | ORAL | Status: DC
  Administered 2023-11-21: 20 g via ORAL
  Filled 2023-11-20: qty 30

## 2023-11-20 MED ORDER — LACTULOSE 10 GM/15ML PO SOLN
20.0000 g | Freq: Three times a day (TID) | ORAL | Status: DC
  Administered 2023-11-20: 20 g via ORAL
  Filled 2023-11-20: qty 30

## 2023-11-20 NOTE — Progress Notes (Addendum)
Telephone conversation with [facility]. [phone]). Matt requested information on filing a Hospital Appeal for discharge. Explained to him the Important Message Letter that is given to all Medicare members of their rights as a patient and the Medicare / CMS web site. Also informed Matt that the IM was delivered to the patient today. All questions answered. Susy Frizzle also stated that he is working with [employer] to have his paperwork escalated to [place] in [place] to be closes to the family.    Jeral Pinch Transition of Care Supervisor [phone]

## 2023-11-20 NOTE — Care Management Important Message (Signed)
Important Message  Patient Details  Name: Joshua Berry MRN: [id number] Date of Birth: 24-Nov-1946   Important Message Given:  Yes - Medicare and Tricare IM  Change in discharge plan. Patient/family would like another copy of this form. Will take a copy to the room now.   [name] A Hadden Steig 11/20/2023, 9:49 AM

## 2023-11-20 NOTE — TOC Progression Note (Addendum)
Transition of Care [facility]) - Progression Note    Patient Details  Name: Joshua Berry MRN: [id number] Date of Birth: [birth year]  Transition of Care [facility]) CM/SW Contact  Allena Katz, LCSW Phone Number: 11/20/2023, 12:36 PM  Clinical Narrative:   CSW spoke with Renetta Chalk at [facility] kim reports that pt's authorization has been canceled she states that their is a discrepancy between the two VA[facility]. She states that since the patient is in [place] the process is that he should automatically be processed through [place] cachement area and the auth should've went through [place]. [facility] does not agree with this and they are meeting today to discuss. Cala Bradford reports to wait until they meet today and come to a conclusion to do anything.    3:11pm CSW spoke with Hinda Glatter to update him on the above.  Expected Discharge Plan: Skilled Nursing Facility    Expected Discharge Plan and Services         Expected Discharge Date: 11/20/23                                     Social Determinants of Health (SDOH) Interventions SDOH Screenings   Food Insecurity: Patient Declined (10/17/2023)  Housing: Patient Declined (10/17/2023)  Transportation Needs: Patient Declined (10/17/2023)  Utilities: Patient Declined (10/17/2023)  Depression (PHQ2-9): Low Risk  (04/24/2023)  Financial Resource Strain: Low Risk  (08/06/2023)   Received from [facility]  Social Connections: Unknown (04/29/2022)   [facility] from [facility]  Tobacco Use: Medium Risk (10/17/2023)    Readmission Risk Interventions    09/15/2023    8:54 AM 08/14/2023    2:18 PM 07/15/2023    8:52 AM  Readmission Risk Prevention Plan  Transportation Screening Complete Complete Complete  PCP or Specialist Appt within 3-5 Days Complete Complete Complete  HRI or Home Care Consult Complete Complete   Social Work Consult for Recovery Care Planning/Counseling Not Complete Complete Complete  Palliative Care  Screening Not Applicable Not Applicable Not Applicable  Medication Review Oceanographer) Not Complete Complete Referral to Pharmacy  Med Review Comments Patient confused and disoriented.

## 2023-11-20 NOTE — Progress Notes (Signed)
Updated patient's son & POA Matt on what happened overnight. Per [place], the patient is not to go to the facility in [place].

## 2023-11-20 NOTE — Progress Notes (Addendum)
[facility]  - [place] at [facility]   PATIENT NAME: Joshua Berry    MR#:  [id number]  DATE OF BIRTH:  Nov 10, 1946  SUBJECTIVE:  patient awake and denies pain or other complaints, bm this morning, tolerating diet   VITALS:  Blood pressure 113/79, pulse 67, temperature 98 F (36.7 C), temperature source Oral, resp. rate 15, height 6\' 2"  (1.88 m), weight 97.4 kg, SpO2 99%.  PHYSICAL EXAMINATION:  limited patient is comfort care GENERAL:  77 y.o.-year-old patient with no acute distress.  NEUROLOGIC:  patient is awake and alert Abdomen: soft, non-tender Ext: warm, no edema  Assessment and Plan  Joshua Berry is a 77 yo with h/o NASH cirrhosis, HTN, and DM who presented on 10/31 with AMS.  NH4 89, improving slowly with high doses of lactulose.  CT head showed mixed density subdural hematomas overlying bilateral cerebral hemispheres, stable since 09/15/2023; however there are hyperdense components within both subdural hematoma suggesting interval hemorrhage into these collections.  Repeat CT head after 6 hours did not show any changes.  Patient seen by neurosurgery, no need for surgery intervention.  His confusion resolved. Mental status much improved. Discharged on 11/8 to [facility] facility who did not accept him. TOC currently working on [place] placement.  11/25: HCPOA and rest of the family agreed for full comfort care for now 11/26: Full comfort care.  Family does not want him to leave the hospital  11/27: assumed care for pt dter lisa and wife at bedside-- patient awake and did eat bowl of oatmeal and some coffee per daughter. Discussed again with daughter limited option for discharge plan. She understands. She will discuss with her family and get back with me. 11/28--cont comfort care [facility]5-- [facility] determining [facility]  AMS Hepatic encephalopathy Chronic SDH Cirrhosis Intracranial hemorrhage Uncontrolled type 2 diabetes with hyperglycemia GERD without  esophagitis Hypothyroidism Hypomagnesemia     Goals of care -full comfort care.    Constipation: resolved had BM this morning, will continue lactulose    DVT prophylaxis   Code Status: Do not attempt resuscitation (DNR) - Comfort care  Family communication :none at bedside. No answer when daughter called today. CODE STATUS: DNR/DNI Level of care: Med-Surg Status is: Inpatient Remains inpatient appropriate because: Limited option and family does not want to consider the option. Per [facility] leadership has been notified. (Patient relation)  11/30-- spoke with patient's daughter at bedside. She understands limited options for patients discharge given he is the [place] patient. She is aware upper leadership is involved. At this time she is in agreement to going to the facility that was offered at [facility] and rehab. Discussed with TOC today to try call facility and see if they still have a bed available  12/2--TOC to let me know if [place] rehab facility can take pt today  12/3--per [facility] rehab facility does not have a bed any longer. TOC has [facility] to other [facility] facilities.  Patient's insurance authorization expires on December 12.       Silvano Bilis M.D    [facility]

## 2023-11-21 MED ORDER — LACTULOSE 10 GM/15ML PO SOLN
10.0000 g | Freq: Every day | ORAL | Status: DC
  Administered 2023-11-22: 10 g via ORAL
  Filled 2023-11-21 (×2): qty 30

## 2023-11-21 NOTE — TOC Progression Note (Signed)
Transition of Care [facility]) - Progression Note    Patient Details  Name: Joshua Berry MRN: [id number] Date of Birth: 06-23-1946  Transition of Care [facility]) CM/SW Contact  Allena Katz, LCSW Phone Number: 11/21/2023, 1:23 PM  Clinical Narrative:   updated forms faxed to cathy kruger at [facility].     Expected Discharge Plan: Skilled Nursing Facility    Expected Discharge Plan and Services         Expected Discharge Date: 11/20/23                                     Social Determinants of Health (SDOH) Interventions SDOH Screenings   Food Insecurity: Patient Declined (10/17/2023)  Housing: Patient Declined (10/17/2023)  Transportation Needs: Patient Declined (10/17/2023)  Utilities: Patient Declined (10/17/2023)  Depression (PHQ2-9): Low Risk  (04/24/2023)  Financial Resource Strain: Low Risk  (08/06/2023)   Received from [facility]  Social Connections: Unknown (04/29/2022)   Received from [facility]  Tobacco Use: Medium Risk (10/17/2023)    Readmission Risk Interventions    09/15/2023    8:54 AM 08/14/2023    2:18 PM 07/15/2023    8:52 AM  Readmission Risk Prevention Plan  Transportation Screening Complete Complete Complete  PCP or Specialist Appt within 3-5 Days Complete Complete Complete  HRI or Home Care Consult Complete Complete   Social Work Consult for Recovery Care Planning/Counseling Not Complete Complete Complete  Palliative Care Screening Not Applicable Not Applicable Not Applicable  Medication Review Oceanographer) Not Complete Complete Referral to Pharmacy  Med Review Comments Patient confused and disoriented.

## 2023-11-21 NOTE — Progress Notes (Signed)
[facility]  - [place] at [facility]   PATIENT NAME: Joshua Berry    MR#:  [id number]  DATE OF BIRTH:  [birth year]  SUBJECTIVE:  patient awake and denies pain or other complaints, bm today, tolerating diet   VITALS:  Blood pressure 124/68, pulse 72, temperature 98.2 F (36.8 C), resp. rate 16, height 6\' 2"  (1.88 m), weight 97.4 kg, SpO2 100%.  PHYSICAL EXAMINATION:  limited patient is comfort care GENERAL:  77 y.o.-year-old patient with no acute distress.  NEUROLOGIC:  patient is awake and alert Abdomen: soft, non-tender Ext: warm, no edema  Assessment and Plan  Joshua Berry is a 77 yo with h/o NASH cirrhosis, HTN, and DM who presented on 10/31 with AMS.  NH4 89, improving slowly with high doses of lactulose.  CT head showed mixed density subdural hematomas overlying bilateral cerebral hemispheres, stable since 09/15/2023; however there are hyperdense components within both subdural hematoma suggesting interval hemorrhage into these collections.  Repeat CT head after 6 hours did not show any changes.  Patient seen by neurosurgery, no need for surgery intervention.  His confusion resolved. Mental status much improved. Discharged on 11/8 to [facility] facility who did not accept him. TOC currently working on [place] placement.  11/25: HCPOA and rest of the family agreed for full comfort care for now 11/26: Full comfort care.  Family does not want him to leave the hospital  11/27: assumed care for pt dter Joshua Berry and wife at bedside-- patient awake and did eat bowl of oatmeal and some coffee per daughter. Discussed again with daughter limited option for discharge plan. She understands. She will discuss with her family and get back with me. 11/28--cont comfort care 12/5-- [facility] determining placement 12/6--[facility] to [facility]  AMS Hepatic encephalopathy Chronic SDH Cirrhosis Intracranial hemorrhage Uncontrolled type 2 diabetes with hyperglycemia GERD  without esophagitis Hypothyroidism Hypomagnesemia     Goals of care -full comfort care.    Constipation: resolved now having BMs will continue lactulose    DVT prophylaxis   Code Status: Do not attempt resuscitation (DNR) - Comfort care  Family communication : daughter Joshua Berry updated telephonically 12/6 CODE STATUS: DNR/DNI Level of care: Med-Surg Status is: Inpatient Remains inpatient appropriate because: pending snf placement       Joshua Berry M.D    [facility]

## 2023-11-21 NOTE — Progress Notes (Signed)
[facility] Liaison Note   Follow up on current hospice referral. TOC reported that patient will discharge to [facility] in [place] on Monday.     Hospital Liaison Team will continue to follow through final disposition.   Please call with any hospice related questions or concerns.    [facility] Liaison [phone]

## 2023-11-21 NOTE — Plan of Care (Signed)
Pt declined q2h turns throughout the night, attempted to educate patient regarding the importance of keeping the bed mobility. Pt became irritated when educated and continued to refuse turns. Will continue to encourage turns.   Problem: Education: Goal: Ability to describe self-care measures that may prevent or decrease complications (Diabetes Survival Skills Education) will improve Outcome: Progressing Goal: Individualized Educational Video(s) Outcome: Progressing   Problem: Coping: Goal: Ability to adjust to condition or change in health will improve Outcome: Progressing   Problem: Fluid Volume: Goal: Ability to maintain a balanced intake and output will improve Outcome: Progressing   Problem: Health Behavior/Discharge Planning: Goal: Ability to identify and utilize available resources and services will improve Outcome: Progressing Goal: Ability to manage health-related needs will improve Outcome: Progressing   Problem: Metabolic: Goal: Ability to maintain appropriate glucose levels will improve Outcome: Progressing   Problem: Nutritional: Goal: Maintenance of adequate nutrition will improve Outcome: Progressing Goal: Progress toward achieving an optimal weight will improve Outcome: Progressing   Problem: Skin Integrity: Goal: Risk for impaired skin integrity will decrease Outcome: Progressing   Problem: Tissue Perfusion: Goal: Adequacy of tissue perfusion will improve Outcome: Progressing   Problem: Education: Goal: Knowledge of General Education information will improve Description: Including pain rating scale, medication(s)/side effects and non-pharmacologic comfort measures Outcome: Progressing   Problem: Health Behavior/Discharge Planning: Goal: Ability to manage health-related needs will improve Outcome: Progressing   Problem: Clinical Measurements: Goal: Ability to maintain clinical measurements within normal limits will improve Outcome: Progressing Goal:  Will remain free from infection Outcome: Progressing Goal: Diagnostic test results will improve Outcome: Progressing Goal: Respiratory complications will improve Outcome: Progressing Goal: Cardiovascular complication will be avoided Outcome: Progressing   Problem: Activity: Goal: Risk for activity intolerance will decrease Outcome: Progressing   Problem: Nutrition: Goal: Adequate nutrition will be maintained Outcome: Progressing   Problem: Coping: Goal: Level of anxiety will decrease Outcome: Progressing   Problem: Elimination: Goal: Will not experience complications related to bowel motility Outcome: Progressing Goal: Will not experience complications related to urinary retention Outcome: Progressing   Problem: Pain Management: Goal: General experience of comfort will improve Outcome: Progressing   Problem: Safety: Goal: Ability to remain free from injury will improve Outcome: Progressing   Problem: Skin Integrity: Goal: Risk for impaired skin integrity will decrease Outcome: Progressing

## 2023-11-22 NOTE — Progress Notes (Signed)
[facility]  - [place] at [facility]   PATIENT NAME: Joshua Berry    MR#:  [id number]  DATE OF BIRTH:  1946-02-04  SUBJECTIVE:  patient awake and denies pain or other complaints, bm today, tolerating diet, visiting with family   VITALS:  Blood pressure 120/61, pulse 64, temperature 98.2 F (36.8 C), resp. rate 18, height 6\' 2"  (1.88 m), weight 97.4 kg, SpO2 98%.  PHYSICAL EXAMINATION:  limited patient is comfort care GENERAL:  77 y.o.-year-old patient with no acute distress.  NEUROLOGIC:  patient is awake and alert Abdomen: soft, non-tender Ext: warm, no edema  Assessment and Plan  RANDEEP KRAFFT is a 77 yo with h/o NASH cirrhosis, HTN, and DM who presented on 10/31 with AMS.  NH4 89, improving slowly with high doses of lactulose.  CT head showed mixed density subdural hematomas overlying bilateral cerebral hemispheres, stable since 09/15/2023; however there are hyperdense components within both subdural hematoma suggesting interval hemorrhage into these collections.  Repeat CT head after 6 hours did not show any changes.  Patient seen by neurosurgery, no need for surgery intervention.  His confusion resolved. Mental status much improved. Discharged on 11/8 to [name] facility who did not accept him. TOC currently working on [place] placement.  11/25: HCPOA and rest of the family agreed for full comfort care for now 11/26: Full comfort care.  Family does not want him to leave the hospital  11/27: assumed care for pt dter lisa and wife at bedside-- patient awake and did eat bowl of oatmeal and some coffee per daughter. Discussed again with daughter limited option for discharge plan. She understands. She will discuss with her family and get back with me. 11/28--cont comfort care 12/5-- va determining placement 12/6--approved to go [facility] m[facility]  AMS Hepatic encephalopathy Chronic SDH Cirrhosis Intracranial hemorrhage Uncontrolled type 2 diabetes with  hyperglycemia GERD without esophagitis Hypothyroidism Hypomagnesemia     Goals of care -full comfort care.    Constipation: resolved now having daily BMs will continue lactulose    DVT prophylaxis   Code Status: Do not attempt resuscitation (DNR) - Comfort care  Family communication : daughter [name] updated telephonically 12/6. Wife updated @ bedside 12/7 CODE STATUS: DNR/DNI Level of care: Med-Surg Status is: Inpatient Remains inpatient appropriate because: pending snf placement       Silvano Bilis M.D    [facility]

## 2023-11-23 MED ORDER — LACTULOSE 10 GM/15ML PO SOLN: 10.0000 g | Freq: Two times a day (BID) | ORAL | Status: DC | PRN

## 2023-11-23 NOTE — Progress Notes (Signed)
[facility]  - [place] at [facility]   PATIENT NAME: Joshua Berry    MR#:  [id number]  DATE OF BIRTH:  March 11, 1946  SUBJECTIVE:  Seen in bed eating lunch no complaints   VITALS:  Blood pressure 112/63, pulse 62, temperature 97.7 F (36.5 C), resp. rate 18, height 6\' 2"  (1.88 m), weight 97.4 kg, SpO2 100%.  PHYSICAL EXAMINATION:  limited patient is comfort care GENERAL:  77 y.o.-year-old patient with no acute distress.  NEUROLOGIC:  patient is awake and alert Abdomen: soft, non-tender Ext: warm, no edema  Assessment and Plan  GREGORI OKELLEY is a 77 yo with h/o NASH cirrhosis, HTN, and DM who presented on 10/31 with AMS.  NH4 89, improving slowly with high doses of lactulose.  CT head showed mixed density subdural hematomas overlying bilateral cerebral hemispheres, stable since 09/15/2023; however there are hyperdense components within both subdural hematoma suggesting interval hemorrhage into these collections.  Repeat CT head after 6 hours did not show any changes.  Patient seen by neurosurgery, no need for surgery intervention.  His confusion resolved. Mental status much improved. Discharged on 11/8 to [facility] facility who did not accept him. TOC currently working on [place] placement.  11/25: HCPOA and rest of the family agreed for full comfort care for now 11/26: Full comfort care.  Family does not want him to leave the hospital  11/27: assumed care for pt dter lisa and wife at bedside-- patient awake and did eat bowl of oatmeal and some coffee per daughter. Discussed again with daughter limited option for discharge plan. She understands. She will discuss with her family and get back with me. 11/28--cont comfort care 12/5-- va determining placement 12/6--approved to go [facility] monday  AMS Hepatic encephalopathy Chronic SDH Cirrhosis Intracranial hemorrhage Uncontrolled type 2 diabetes with hyperglycemia GERD without  esophagitis Hypothyroidism Hypomagnesemia     Goals of care -full comfort care.    Constipation: resolved now having daily BMs will continue lactulose    DVT prophylaxis   Code Status: Do not attempt resuscitation (DNR) - Comfort care  Family communication : daughter [name] updated telephonically 12/6. Wife updated @ bedside 12/7 CODE STATUS: DNR/DNI Level of care: Med-Surg Status is: Inpatient Remains inpatient appropriate because: pending snf placement       Silvano Bilis M.D    [facility]

## 2023-11-24 MED ORDER — LACTULOSE 10 GM/15ML PO SOLN: 10.0000 g | Freq: Every day | ORAL | Status: DC | PRN

## 2023-11-24 NOTE — Care Management Important Message (Signed)
Important Message  Patient Details  Name: Joshua Berry MRN: [id number] Date of Birth: 08-Sep-1946   Important Message Given:  Yes - Medicare IM     [name] [name] 11/24/2023, 10:20 AM

## 2023-11-24 NOTE — TOC Transition Note (Addendum)
Transition of Care [facility]) - CM/SW Discharge Note   Patient Details  Name: Joshua Berry MRN: [id number] Date of Birth: February 22, 1946  Transition of Care [facility]) CM/SW Contact:  Allena Katz, LCSW Phone Number: 11/24/2023, 9:42 AM   Clinical Narrative:   Pt has orders to discharge [facility]. RN given number for report. DC summary to be sent once in. Daughter lisa notified. Pt going to room 311B.    Final next level of care: Skilled Nursing Facility Barriers to Discharge: Barriers Resolved   Patient Goals and CMS Choice      Discharge Placement                Patient chooses bed at: [facility] Patient to be transferred to facility by: EMS   Patient and family notified of of transfer: 11/24/23  Discharge Plan and Services Additional resources added to the After Visit Summary for                                       Social Determinants of Health (SDOH) Interventions SDOH Screenings   Food Insecurity: Patient Declined (10/17/2023)  Housing: Patient Declined (10/17/2023)  Transportation Needs: Patient Declined (10/17/2023)  Utilities: Patient Declined (10/17/2023)  Depression (PHQ2-9): Low Risk  (04/24/2023)  Financial Resource Strain: Low Risk  (08/06/2023)   Received from [facility]  Social Connections: Unknown (04/29/2022)   Received from [facility]  Tobacco Use: Medium Risk (10/17/2023)     Readmission Risk Interventions    09/15/2023    8:54 AM 08/14/2023    2:18 PM 07/15/2023    8:52 AM  Readmission Risk Prevention Plan  Transportation Screening Complete Complete Complete  PCP or Specialist Appt within 3-5 Days Complete Complete Complete  HRI or Home Care Consult Complete Complete   Social Work Consult for Recovery Care Planning/Counseling Not Complete Complete Complete  Palliative Care Screening Not Applicable Not Applicable Not Applicable  Medication Review Oceanographer) Not Complete Complete Referral  to Pharmacy  Med Review Comments Patient confused and disoriented.

## 2023-11-24 NOTE — Discharge Summary (Signed)
Joshua Berry:[id number] DOB: 1946/09/26 DOA: 10/16/2023  PCP: [facility]  Admit date: 10/16/2023 Discharge date: 11/24/2023  Time spent: 35 minutes     Discharge Diagnoses:  Principal Problem:   Subdural hematoma (HCC) Active Problems:   Hepatic encephalopathy (HCC)   Pancytopenia (HCC)   HLD (hyperlipidemia)   GERD without esophagitis   Hypothyroidism   Overweight (BMI 25.0-29.9)   Secondary esophageal varices without bleeding [facility])   Physical deconditioning   Hypomagnesemia   Intracranial hemorrhage (HCC)   Goals of care, counseling/discussion   Discharge Condition: stable  Diet recommendation: ad lib  Filed Weights   10/17/23 2035 10/24/23 0552  Weight: 98.1 kg 97.4 kg    History of present illness:  From admission h and p   Mr. Zymir Englehart is a 77 year old male with history of liver cirrhosis, hypertension, history of GI bleed, thrombocytopenia, insulin-dependent diabetes mellitus, GERD, who presents emergency department for chief concerns of altered mental status.   Patient reports no falling or head trauma today.   He denies chest pain, shortness of breath, dysuria, hematuria, diarrhea, blood in his stool, nausea, vomiting.    Hospital Course:  Patient presented with encephalopathy. CT head showed possible subacute subudral hematoma, he has a history of this. Neurosurgery consulted, no intervention advised. Transitioned to comfort care on 11/26 in light of his end-stage liver disease, frequent hospitalizations, and other comorbidities. Extended hospital stay due to placement issues, ultimately discharged to skilled nursing with hospice following.   Procedures: none   Consultations: Palliative, neurosurgery.  Discharge Exam: Vitals:   11/24/23 0447 11/24/23 0723  BP: (!) 104/57 (!) 99/58  Pulse: 64 72  Resp: 16 18  Temp: 98.4 F (36.9 C) 98 F (36.7 C)  SpO2: 98% 100%    General: NAD Cardiovascular: RRR Respiratory: CTAB Abdomen:  obese, soft and non-tender Ext: warm, trace edema  Discharge Instructions   Discharge Instructions     Call MD for:   Complete by: As directed    confusion   Call MD for:  extreme fatigue   Complete by: As directed    Call MD for:  persistant dizziness or light-headedness   Complete by: As directed    Diet Carb Modified   Complete by: As directed    Diet general   Complete by: As directed    Discharge instructions   Complete by: As directed    1. Increase lactulose to 45 grams every 4 hours; hold for more than 2-3 soft stools daily 2. [facility] are returning to [facility] for ongoing care   Increase activity slowly   Complete by: As directed    Increase activity slowly   Complete by: As directed    No wound care   Complete by: As directed    No wound care   Complete by: As directed       Allergies as of 11/24/2023       Reactions   Camphor Swelling, Rash   Latex Rash   Lisinopril Cough   Other Reaction(s): Cough   Sulfamethoxazole Swelling   Chocolate    Family states he is allergic   Nsaids Other (See Comments)   NASH        Medication List     STOP taking these medications    atorvastatin 20 MG tablet Commonly known as: LIPITOR   butalbital-acetaminophen-caffeine 50-325-40 MG tablet Commonly known as: FIORICET   Cholecalciferol 50 MCG (2000 UT) Tabs   clotrimazole 1 % external solution Commonly  known as: LOTRIMIN   famotidine 20 MG tablet Commonly known as: PEPCID   feeding supplement Liqd   Fish Oil 1000 MG Caps   furosemide 20 MG tablet Commonly known as: LASIX   Gold Bond Ultimate Lotn   insulin aspart 100 UNIT/ML injection Commonly known as: novoLOG   insulin glargine 100 UNIT/ML injection Commonly known as: LANTUS   ketotifen 0.025 % ophthalmic solution Commonly known as: ZADITOR   levothyroxine 100 MCG tablet Commonly known as: SYNTHROID   lidocaine 5 % Commonly known as: LIDODERM   magnesium oxide 400 (240 Mg) MG  tablet Commonly known as: MAG-OX   multivitamin with minerals Tabs tablet   pantoprazole 40 MG tablet Commonly known as: Protonix   polyethylene glycol 17 g packet Commonly known as: MIRALAX / GLYCOLAX   RA Probiotic Digestive Care Caps   rifaximin 550 MG Tabs tablet Commonly known as: XIFAXAN   thiamine 100 MG tablet Commonly known as: Vitamin B-1       TAKE these medications    acetaminophen 500 MG tablet Commonly known as: TYLENOL Take 1 tablet (500 mg total) by mouth every 6 (six) hours as needed for moderate pain, fever, headache or mild pain (headache).   diclofenac Sodium 1 % Gel Commonly known as: VOLTAREN Apply 2 g topically 4 (four) times daily.   lactulose 10 GM/15ML solution Commonly known as: CHRONULAC Take 15 mLs (10 g total) by mouth daily as needed for mild constipation. What changed:  how much to take when to take this reasons to take this   Magnesium Oxide 420 MG Tabs Take 2 tablets (840 mg total) by mouth 2 (two) times daily.   melatonin 3 MG Tabs tablet Take 6 mg by mouth at bedtime.   terazosin 5 MG capsule Commonly known as: HYTRIN Take 5 mg by mouth at bedtime.       Allergies  Allergen Reactions   Camphor Swelling and Rash   Latex Rash   Lisinopril Cough    Other Reaction(s): Cough   Sulfamethoxazole Swelling   Chocolate     Family states he is allergic   Nsaids Other (See Comments)    NASH      The results of significant diagnostics from this hospitalization (including imaging, microbiology, ancillary and laboratory) are listed below for reference.    Significant Diagnostic Studies: CT HEAD WO CONTRAST ( )  Result Date: 11/06/2023 CLINICAL DATA:  Mental status change, unknown cause EXAM: CT HEAD WITHOUT CONTRAST TECHNIQUE: Contiguous axial images were obtained from the base of the skull through the vertex without intravenous contrast. RADIATION DOSE REDUCTION: This exam was performed according to the departmental  dose-optimization program which includes automated exposure control, adjustment of the mA and/or kV according to patient size and/or use of iterative reconstruction technique. COMPARISON:  Head CT 10/19/2023 FINDINGS: Brain: Postprocedural changes from bilateral burr hole craniotomies. Redemonstrated bilateral cerebral convexity mixed density subdural hematomas measuring up to 6 mm on the right and 3 mm on the left. The collection of the left has slightly decreased in size. Mass effect. No midline shift. No new sites of hemorrhage. No CT evidence of an acute cortical infarct. Vascular: No hyperdense vessel or unexpected calcification. Skull: Normal. Negative for fracture or focal lesion. Sinuses/Orbits: No middle ear or mastoid effusion. Paranasal sinuses are notable for mild mucosal thickening in the posterior ethmoid air cells on the left. Orbits are unremarkable. Other: None IMPRESSION: Redemonstrated bilateral cerebral convexity mixed density subdural hematomas measuring up to 6 mm  on the right and 3 mm on the left. The collection of the left has slightly decreased in size. No new sites of hemorrhage. Electronically Signed   By: Lorenza Cambridge M.D.   On: 11/06/2023 16:14    Microbiology: No results found for this or any previous visit (from the past 240 hour(s)).   Labs: Basic Metabolic Panel: No results for input(s): "NA", "K", "CL", "CO2", "GLUCOSE", "BUN", "CREATININE", "CALCIUM", "MG", "PHOS" in the last 168 hours. Liver Function Tests: No results for input(s): "AST", "ALT", "ALKPHOS", "BILITOT", "PROT", "ALBUMIN" in the last 168 hours. No results for input(s): "LIPASE", "AMYLASE" in the last 168 hours. No results for input(s): "AMMONIA" in the last 168 hours. CBC: No results for input(s): "WBC", "NEUTROABS", "HGB", "HCT", "MCV", "PLT" in the last 168 hours. Cardiac Enzymes: No results for input(s): "CKTOTAL", "CKMB", "CKMBINDEX", "TROPONINI" in the last 168 hours. BNP: BNP (last 3  results) Recent Labs    08/13/23 0118  BNP 23.8    ProBNP (last 3 results) No results for input(s): "PROBNP" in the last 8760 hours.  CBG: No results for input(s): "GLUCAP" in the last 168 hours.     Signed:  Silvano Bilis MD.  [facility] 11/24/2023, 9:56 AM

## 2023-11-24 NOTE — NC FL2 (Signed)
[employer] [facility] MEDICAID FL2 LEVEL OF CARE FORM     IDENTIFICATION  Patient Name: Joshua Berry Birthdate: Jul 10, 1946 Sex: male Admission Date (Current Location): 10/16/2023  [place] and [place] Number:  Chiropodist and Address:  [facility], [address] [place], [place] [address]      Provider Number: [id number]  Attending Physician Name and Address:  Kathrynn Running, MD  Relative Name and Phone Number:       Current Level of Care: Hospital Recommended Level of Care: Skilled Nursing Facility (with hospice) Prior Approval Number:    Date Approved/Denied:   PASRR Number: [id number]  Discharge Plan: SNF    Current Diagnoses: Patient Active Problem List   Diagnosis Date Noted   Goals of care, counseling/discussion 11/10/2023   Intracranial hemorrhage (HCC) 10/17/2023   Encephalopathy 09/15/2023   Right wrist pain 08/14/2023   Bilateral subdural hematomas (HCC) 08/13/2023   Malnutrition of moderate degree 08/13/2023   Decubitus ulcer of coccyx, stage I 08/13/2023   Acute metabolic encephalopathy 08/12/2023   Staphylococcus epidermidis bacteremia 07/23/2023   Acute nontraumatic intracranial subdural hematoma (HCC) 07/15/2023   Subdural hematoma (HCC) 07/14/2023   Compression of brain (HCC) 07/14/2023   Uncal herniation (HCC) 07/14/2023   Type 2 diabetes mellitus without complications (HCC) 06/17/2023   GERD without esophagitis 06/17/2023   Dyslipidemia 06/17/2023   Pressure injury of skin 06/17/2023   Acute urinary retention 04/11/2023   Streptococcal bacteremia 04/07/2023   Altered mental status 04/06/2023   Hepatic encephalopathy (HCC) 04/05/2023   Overweight (BMI 25.0-29.9) 08/03/2022   Hyponatremia, hypokalemia and hypomagnesemia 05/18/2022   Hyponatremia 05/18/2022   Hypokalemia 05/18/2022   Back pain 05/17/2022   Hypomagnesemia 05/17/2022   Acute hepatic encephalopathy (HCC) 05/17/2022   AKI (acute kidney injury) (HCC)  05/16/2022   Elevated liver enzymes 05/16/2022   Hypotension 05/16/2022   Right shoulder pain 05/16/2022   Hypothyroidism 05/16/2022   Physical deconditioning 05/16/2022   Obesity (BMI 30-39.9) 05/16/2022   Lactic acidosis 05/16/2022   Fall 05/16/2022   Septic shock due to Staphylococcus bacteremia 05/14/2022   Chest pain 04/06/2022   Controlled IDDM-2 with hyperglycemia 04/06/2022   HLD (hyperlipidemia) 04/06/2022   Chronic diastolic CHF (congestive heart failure) (HCC) 04/06/2022   Pancytopenia (HCC) 04/06/2022   GI bleeding 04/24/2021   Cirrhosis of liver without ascites (HCC)    Secondary esophageal varices without bleeding (HCC)    Portal hypertension (HCC)    Stomach irritation    Melena    Acute gastrointestinal hemorrhage 07/19/2019   Severe sepsis (HCC) 12/20/2018    Orientation RESPIRATION BLADDER Height & Weight     Self, Time, Situation, Place  Normal Continent Weight: 214 lb 11.7 oz (97.4 kg) Height:  6\' 2"  (188 cm)  BEHAVIORAL SYMPTOMS/MOOD NEUROLOGICAL BOWEL NUTRITION STATUS   (None)  (None) Continent Diet (Heart healthy. Soft foods.)  AMBULATORY STATUS COMMUNICATION OF NEEDS Skin   Limited Assist Verbally Skin abrasions, Bruising, Other (Comment), PU Stage and Appropriate Care (Erythema/redness, rash.)   PU Stage 2 Dressing:  (Mid coccyx: Foam prn.)                   Personal Care Assistance Level of Assistance  Bathing, Feeding, Dressing Bathing Assistance: Maximum assistance Feeding assistance: Limited assistance Dressing Assistance: Maximum assistance     Functional Limitations Info  Sight, Hearing, Speech Sight Info: Adequate Hearing Info: Adequate Speech Info: Adequate    SPECIAL CARE FACTORS FREQUENCY  PT (By licensed PT), OT (  By licensed OT)     PT Frequency: 5 x week OT Frequency: 5 x week            Contractures Contractures Info: Not present    Additional Factors Info  Code Status, Allergies Code Status Info:  DNR Allergies Info: Camphor, Latex, Lisinopril, Sulfamethoxazole, Chocolate, Nsaids           Current Medications (11/24/2023):  This is the current hospital active medication list Current Facility-Administered Medications  Medication Dose Route Frequency Provider Last Rate Last Admin   acetaminophen (TYLENOL) tablet 650 mg  650 mg Oral Q6H PRN Delfino Lovett, MD       Or   acetaminophen (TYLENOL) suppository 650 mg  650 mg Rectal Q6H PRN Delfino Lovett, MD       acetaminophen (TYLENOL) tablet 500 mg  500 mg Oral Q8H PRN Marcelino Duster, MD   500 mg at 11/22/23 0850   diphenhydrAMINE (BENADRYL) injection 25 mg  25 mg Intravenous Q4H PRN Delfino Lovett, MD       glycopyrrolate (ROBINUL) tablet 1 mg  1 mg Oral Q4H PRN Delfino Lovett, MD       Or   glycopyrrolate (ROBINUL) injection 0.2 mg  0.2 mg Subcutaneous Q4H PRN Delfino Lovett, MD       Or   glycopyrrolate (ROBINUL) injection 0.2 mg  0.2 mg Intravenous Q4H PRN Delfino Lovett, MD       haloperidol lactate (HALDOL) injection 2.5-5 mg  2.5-5 mg Intravenous Q4H PRN Delfino Lovett, MD       lactulose (CHRONULAC) 10 GM/15ML solution 10 g  10 g Oral BID PRN Wouk, Wilfred Curtis, MD       LORazepam (ATIVAN) 2 MG/ML concentrated solution 1 mg  1 mg Oral Q4H PRN Delfino Lovett, MD       melatonin tablet 5 mg  5 mg Oral QHS Cox, Amy N, DO   5 mg at 11/23/23 2128   morphine (PF) 2 MG/ML injection 2-4 mg  2-4 mg Intravenous Q30 min PRN Delfino Lovett, MD       ondansetron (ZOFRAN-ODT) disintegrating tablet 4 mg  4 mg Oral Q6H PRN Delfino Lovett, MD       Or   ondansetron (ZOFRAN) injection 4 mg  4 mg Intravenous Q6H PRN Delfino Lovett, MD       Oral care mouth rinse  15 mL Mouth Rinse 4 times per day Kathrynn Running, MD   15 mL at 11/24/23 4098   Oral care mouth rinse  15 mL Mouth Rinse PRN Wouk, Wilfred Curtis, MD       polyvinyl alcohol (LIQUIFILM TEARS) 1.4 % ophthalmic solution 1 drop  1 drop Both Eyes QID PRN Delfino Lovett, MD       sodium chloride flush (NS) 0.9 %  injection 10 mL  10 mL Intravenous Q12H Sherryll Burger, Vipul, MD   10 mL at 11/24/23 0814   terazosin (HYTRIN) capsule 1 mg  1 mg Oral QHS Delfino Lovett, MD   1 mg at 11/23/23 2132     Discharge Medications: Please see discharge summary for a list of discharge medications.   STOP taking these medications     atorvastatin 20 MG tablet Commonly known as: LIPITOR    butalbital-acetaminophen-caffeine 50-325-40 MG tablet Commonly known as: FIORICET    Cholecalciferol 50 MCG (2000 UT) Tabs    clotrimazole 1 % external solution Commonly known as: LOTRIMIN    famotidine 20 MG tablet Commonly known as: PEPCID  feeding supplement Liqd    Fish Oil 1000 MG Caps    furosemide 20 MG tablet Commonly known as: LASIX    Gold Bond Ultimate Lotn    insulin aspart 100 UNIT/ML injection Commonly known as: novoLOG    insulin glargine 100 UNIT/ML injection Commonly known as: LANTUS    ketotifen 0.025 % ophthalmic solution Commonly known as: ZADITOR    levothyroxine 100 MCG tablet Commonly known as: SYNTHROID    lidocaine 5 % Commonly known as: LIDODERM    magnesium oxide 400 (240 Mg) MG tablet Commonly known as: MAG-OX    multivitamin with minerals Tabs tablet    pantoprazole 40 MG tablet Commonly known as: Protonix    polyethylene glycol 17 g packet Commonly known as: MIRALAX / GLYCOLAX    RA Probiotic Digestive Care Caps    rifaximin 550 MG Tabs tablet Commonly known as: XIFAXAN    thiamine 100 MG tablet Commonly known as: Vitamin B-1           TAKE these medications     acetaminophen 500 MG tablet Commonly known as: TYLENOL Take 1 tablet (500 mg total) by mouth every 6 (six) hours as needed for moderate pain, fever, headache or mild pain (headache).    diclofenac Sodium 1 % Gel Commonly known as: VOLTAREN Apply 2 g topically 4 (four) times daily.    lactulose 10 GM/15ML solution Commonly known as: CHRONULAC Take 15 mLs (10 g total) by mouth daily as needed for  mild constipation. What changed:  how much to take when to take this reasons to take this    Magnesium Oxide 420 MG Tabs Take 2 tablets (840 mg total) by mouth 2 (two) times daily.    melatonin 3 MG Tabs tablet Take 6 mg by mouth at bedtime.    terazosin 5 MG capsule Commonly known as: HYTRIN Take 5 mg by mouth at bedtime.    Relevant Imaging Results:  Relevant Lab Results:   Additional Information SS#: [id number]  Allena Katz, LCSW

## 2024-03-02 ENCOUNTER — Ambulatory Visit: Admitting: Podiatry

## 2024-03-09 ENCOUNTER — Ambulatory Visit (INDEPENDENT_AMBULATORY_CARE_PROVIDER_SITE_OTHER): Admitting: Podiatry

## 2024-03-09 DIAGNOSIS — M79675 Pain in left toe(s): Secondary | ICD-10-CM | POA: Diagnosis not present

## 2024-03-09 DIAGNOSIS — B351 Tinea unguium: Secondary | ICD-10-CM | POA: Diagnosis not present

## 2024-03-09 DIAGNOSIS — M79674 Pain in right toe(s): Secondary | ICD-10-CM | POA: Diagnosis not present

## 2024-03-09 NOTE — Progress Notes (Signed)
  Subjective:  Patient ID: Joshua Berry, male    DOB: [birth year],  MRN: [id number]  Chief Complaint  Patient presents with   Foot Pain   78 y.o. male returns for the above complaint.  Patient presents with thickened onychodystrophy mycotic toenails x 10 mild pain on palpation hurts with ambulation is with pressure he would like to discuss treatment options for this has not seen and was prior to seeing me  Objective:  There were no vitals filed for this visit. Podiatric Exam: Vascular: dorsalis pedis and posterior tibial pulses are palpable bilateral. Capillary return is immediate. Temperature gradient is WNL. Skin turgor WNL  Sensorium: Normal Semmes Weinstein monofilament test. Normal tactile sensation bilaterally. Nail Exam: Pt has thick disfigured discolored nails with subungual debris noted bilateral entire nail hallux through fifth toenails.  Pain on palpation to the nails. Ulcer Exam: There is no evidence of ulcer or pre-ulcerative changes or infection. Orthopedic Exam: Muscle tone and strength are WNL. No limitations in general ROM. No crepitus or effusions noted.  Skin: No Porokeratosis. No infection or ulcers    Assessment & Plan:   1. Pain due to onychomycosis of toenails of both feet     Patient was evaluated and treated and all questions answered.  Onychomycosis with pain  -Nails palliatively debrided as below. -Educated on self-care  Procedure: Nail Debridement Rationale: pain  Type of Debridement: manual, sharp debridement. Instrumentation: Nail nipper, rotary burr. Number of Nails: 10  Procedures and Treatment: Consent by patient was obtained for treatment procedures. The patient understood the discussion of treatment and procedures well. All questions were answered thoroughly reviewed. Debridement of mycotic and hypertrophic toenails, 1 through 5 bilateral and clearing of subungual debris. No ulceration, no infection noted.  Return Visit-Office Procedure:  Patient instructed to return to the office for a follow up visit 3 months for continued evaluation and treatment.  Nicholes Rough, DPM    No follow-ups on file.

## 2024-06-15 DEATH — deceased
